# Patient Record
Sex: Female | Born: 1950
Health system: Southern US, Community
[De-identification: ages and names within clinical notes are randomized; demographics above are authoritative.]

## PROBLEM LIST (undated history)

## (undated) DIAGNOSIS — M1990 Unspecified osteoarthritis, unspecified site: Secondary | ICD-10-CM

## (undated) DIAGNOSIS — K2210 Ulcer of esophagus without bleeding: Secondary | ICD-10-CM

## (undated) DIAGNOSIS — R0600 Dyspnea, unspecified: Secondary | ICD-10-CM

## (undated) DIAGNOSIS — M3119 Other thrombotic microangiopathy: Secondary | ICD-10-CM

## (undated) DIAGNOSIS — K2970 Gastritis, unspecified, without bleeding: Secondary | ICD-10-CM

## (undated) DIAGNOSIS — I509 Heart failure, unspecified: Secondary | ICD-10-CM

## (undated) DIAGNOSIS — E079 Disorder of thyroid, unspecified: Secondary | ICD-10-CM

## (undated) DIAGNOSIS — E669 Obesity, unspecified: Secondary | ICD-10-CM

## (undated) DIAGNOSIS — I2510 Atherosclerotic heart disease of native coronary artery without angina pectoris: Secondary | ICD-10-CM

## (undated) DIAGNOSIS — K269 Duodenal ulcer, unspecified as acute or chronic, without hemorrhage or perforation: Secondary | ICD-10-CM

## (undated) DIAGNOSIS — R413 Other amnesia: Secondary | ICD-10-CM

## (undated) DIAGNOSIS — I255 Ischemic cardiomyopathy: Secondary | ICD-10-CM

## (undated) DIAGNOSIS — D649 Anemia, unspecified: Secondary | ICD-10-CM

## (undated) DIAGNOSIS — E039 Hypothyroidism, unspecified: Secondary | ICD-10-CM

## (undated) DIAGNOSIS — I10 Essential (primary) hypertension: Secondary | ICD-10-CM

## (undated) DIAGNOSIS — I5022 Chronic systolic (congestive) heart failure: Secondary | ICD-10-CM

## (undated) DIAGNOSIS — E119 Type 2 diabetes mellitus without complications: Secondary | ICD-10-CM

## (undated) DIAGNOSIS — M311 Thrombotic microangiopathy: Secondary | ICD-10-CM

## (undated) HISTORY — DX: Disorder of thyroid, unspecified: E07.9

## (undated) HISTORY — PX: ECTOPIC PREGNANCY SURGERY: SHX613

## (undated) HISTORY — PX: SPLENECTOMY, PARTIAL: SHX787

## (undated) HISTORY — DX: Duodenal ulcer, unspecified as acute or chronic, without hemorrhage or perforation: K26.9

## (undated) HISTORY — DX: Type 2 diabetes mellitus without complications: E11.9

## (undated) HISTORY — DX: Anemia, unspecified: D64.9

## (undated) HISTORY — DX: Heart failure, unspecified: I50.9

## (undated) HISTORY — DX: Other amnesia: R41.3

## (undated) HISTORY — PX: CARDIAC CATHETERIZATION: SHX172

## (undated) HISTORY — DX: Obesity, unspecified: E66.9

---

## 2005-06-20 DIAGNOSIS — M3119 Other thrombotic microangiopathy: Secondary | ICD-10-CM

## 2005-06-20 HISTORY — DX: Other thrombotic microangiopathy: M31.19

## 2005-06-21 ENCOUNTER — Inpatient Hospital Stay (HOSPITAL_COMMUNITY): Admission: EM | Admit: 2005-06-21 | Discharge: 2005-07-27 | Payer: Self-pay | Admitting: Emergency Medicine

## 2005-06-22 ENCOUNTER — Ambulatory Visit: Payer: Self-pay | Admitting: Internal Medicine

## 2005-06-22 ENCOUNTER — Encounter: Payer: Self-pay | Admitting: Pulmonary Disease

## 2005-06-28 ENCOUNTER — Ambulatory Visit: Payer: Self-pay | Admitting: Pulmonary Disease

## 2005-07-20 ENCOUNTER — Encounter (INDEPENDENT_AMBULATORY_CARE_PROVIDER_SITE_OTHER): Payer: Self-pay | Admitting: Specialist

## 2005-07-20 HISTORY — PX: SPLENECTOMY: SUR1306

## 2005-07-23 ENCOUNTER — Ambulatory Visit: Payer: Self-pay | Admitting: Oncology

## 2005-07-26 ENCOUNTER — Ambulatory Visit: Payer: Self-pay | Admitting: Internal Medicine

## 2005-08-02 ENCOUNTER — Ambulatory Visit: Payer: Self-pay | Admitting: Family Medicine

## 2005-08-09 ENCOUNTER — Ambulatory Visit: Payer: Self-pay | Admitting: Family Medicine

## 2005-11-01 ENCOUNTER — Ambulatory Visit: Payer: Self-pay | Admitting: Internal Medicine

## 2006-01-03 ENCOUNTER — Ambulatory Visit: Payer: Self-pay | Admitting: Internal Medicine

## 2006-05-10 ENCOUNTER — Inpatient Hospital Stay (HOSPITAL_COMMUNITY): Admission: EM | Admit: 2006-05-10 | Discharge: 2006-05-16 | Payer: Self-pay | Admitting: Emergency Medicine

## 2007-03-27 ENCOUNTER — Ambulatory Visit (HOSPITAL_COMMUNITY): Admission: RE | Admit: 2007-03-27 | Discharge: 2007-03-27 | Payer: Self-pay | Admitting: Pulmonary Disease

## 2007-04-21 ENCOUNTER — Ambulatory Visit (HOSPITAL_COMMUNITY): Admission: RE | Admit: 2007-04-21 | Discharge: 2007-04-21 | Payer: Self-pay | Admitting: General Surgery

## 2007-04-24 ENCOUNTER — Encounter (INDEPENDENT_AMBULATORY_CARE_PROVIDER_SITE_OTHER): Payer: Self-pay | Admitting: Diagnostic Radiology

## 2007-09-21 ENCOUNTER — Encounter: Payer: Self-pay | Admitting: Family Medicine

## 2008-03-31 ENCOUNTER — Ambulatory Visit (HOSPITAL_COMMUNITY): Admission: RE | Admit: 2008-03-31 | Discharge: 2008-03-31 | Payer: Self-pay | Admitting: Pulmonary Disease

## 2010-10-10 ENCOUNTER — Encounter: Payer: Self-pay | Admitting: Emergency Medicine

## 2010-10-20 NOTE — Letter (Signed)
Summary: Historic Patient File  Historic Patient File   Imported By: Lind Guest 06/22/2010 09:20:21  _____________________________________________________________________  External Attachment:    Type:   Image     Comment:   External Document

## 2011-02-02 NOTE — H&P (Signed)
NAME:  [name], SANTINO NO.:  [id number]   MEDICAL RECORD NO.:  [id number]          PATIENT TYPE:  OUT   LOCATION:  RAD                           FACILITY:  [facility]   PHYSICIAN:  Dalia Heading, M.D.  DATE OF BIRTH:  11/05/1950   DATE OF ADMISSION:  DATE OF DISCHARGE:  LH                              HISTORY & PHYSICAL   CHIEF COMPLAINT:  Solitary thyroid nodule.   HISTORY OF PRESENT ILLNESS:  The patient is a 60 year old black female  who is referred for evaluation and treatment of a right thyroid nodule.  She has had some voice changes, but no dysphagia or pressure sensation.  She does have weight gain.  There is no family history of thyroid cancer  or radiation to the neck.  No heart palpitations or heat intolerances  noted.   PAST MEDICAL HISTORY:  TTP, insulin dependent diabetes mellitus.   PAST SURGICAL HISTORY:  Splenectomy.   CURRENT MEDICATIONS:  1. Lantus.  2. NovoLog.  3. Avapro.   ALLERGIES:  No known drug allergies.   REVIEW OF SYSTEMS:  Noncontributory.   PHYSICAL EXAM:  GENERAL:  Patient is a well developed, well nourished  black female in no acute distress.  NECK:  Supple without lymphadenopathy.  A large thyroid is noted  diffusely with a large upper right pole nodule palpable and visible.  LUNGS:  Clear to auscultation with equal breath sounds bilaterally.  HEART:  Reveals regular rate and rhythm without S3, S4, murmurs.   LABORATORY DATA:  Ultrasound of the thyroid reveals a 2.6 x 2.3 x 1.5 cm  dominant mass in the right upper thyroid pole.  It is solid in nature.   IMPRESSION:  Solitary right thyroid lobe nodule.   RECOMMENDATIONS:  The patient is scheduled for an ultrasound guided fine  needle aspiration of the thyroid nodule on 04/21/07.      Dalia Heading, M.D.  Electronically Signed     MAJ/MEDQ  D:  04/20/2007  T:  04/21/2007  Job:  [id number]   cc:   [facility] Radiology Department   Ramon Dredge L. Juanetta Gosling, M.D.  Fax: [phone]

## 2011-02-05 NOTE — Op Note (Signed)
NAMEKENZLIE, DISCH NO.:  [id number]   MEDICAL RECORD NO.:  [id number]          PATIENT TYPE:  INP   LOCATION:  5532                         FACILITY:  [facility]   PHYSICIAN:  Cherylynn Ridges, M.D.    DATE OF BIRTH:  Feb 06, 1951   DATE OF PROCEDURE:  07/20/2005  DATE OF DISCHARGE:                                 OPERATIVE REPORT   PREOP DIAGNOSIS:  Thrombocytopenic purpura with splenomegaly.   POSTOP DIAGNOSIS:  Thrombocytopenic purpura with splenomegaly with a normal  size spleen.   PROCEDURE:  Splenectomy.   SURGEON:  Dr. Lindie Spruce.   ASSISTANT:  Dr. Janee Morn and physician assistant student Darcel Bayley.   ANESTHESIA:  General endotracheal.   ESTIMATED BLOOD LOSS:  500 mL.   COMPLICATIONS:  None.   CONDITION:  Stable.   FINDINGS:  Normal-sized spleen with adhesions in the left upper quadrant.   INDICATIONS FOR OPERATION:  The patient is a 60 year old female who has been  in the hospital getting plasmapheresis for recently diagnosed of TTP who now  comes in for splenectomy.   OPERATION:  The patient was taken to the operating room, placed on table in  supine position. After an adequate endotracheal anesthetic was administered  she was prepped and draped in usual sterile manner exposing primarily her  left upper quadrant in the midline.   The left subcostal incision was made #10 blade measuring approximately 20 to  25 cm long.  It was taken down into the subcutaneous tissue down through the  anterior sheath of the rectus muscle. Rectus muscle itself and then the  posterior sheath up to the midline medially and laterally to the strap  muscles of the lateral side. We took it down to the peritoneal cavity and  once we were in place, used an Omni retractor to provide adequate exposure  the left upper quadrant.   The patient was placed in reverse Trendelenburg and her right side was  tilted down order to get exposure.  Adhesions of omentum and other  structures to the anterior abdominal wall had to be taken down including the  left lateral lobe of the liver. Once this was done, allowed [place] to expose the  spleen. There was a splenocolic ligament which was cauterized and moved out  of the way as we mobilized the spleen medially.   We cauterized, we used Metzenbaum scissors to cut the peritoneum and the  superior lateral aspect of the spleen in order to mobilize it medially. This  allowed [place] some exposure and allowed [place] to get to the hilum. Initial Kelly  clamp across the hilum caused some bleeding which had to be controlled with  the large clamp across the hilum. We then were able to control the bleeding  using Kelly clamps and 2-0 silk ties. The suture ligatures of 2-0 silk also  had to be used to control bleeding in that area. Short gastric vessels were  taken between right-angle clamps and 2-0 silk ties. Once this was done, we  irrigated with saline then obtained further hemostasis using suture  ligatures, ties, large hemoclips and  subsequently at the end we applied  Tisseel, Avoteen and Thrombin Gelfoam in the left upper quadrant. This was  after the spleen had been removed and placed off as specimen.   Once we had adequate hemostasis, we irrigated and placed the hemostatic  agents as mentioned. Once this was done, we then went ahead and closed  without a drain. The posterior rectus sheath was closed using running #1 PDS  suture and the anterior sheath and midline were closed using interrupted  figure-of-eight stitches of #1 Vicryl and #1 PDS. We irrigated subcu and  closed skin using stainless steel staples. All counts were correct including  needles, sponges and instruments.  Sterile dressing was applied.      Cherylynn Ridges, M.D.  Electronically Signed     JOW/MEDQ  D:  07/20/2005  T:  07/20/2005  Job:  [id number]   cc:   Coralyn Helling, M.D.

## 2011-02-05 NOTE — Discharge Summary (Signed)
[name], [name]:  [id number]   MEDICAL RECORD NO.:  [id number]          PATIENT TYPE:  INP   LOCATION:  5532                         FACILITY:  [facility]   PHYSICIAN:  Coralyn Helling, M.D.      DATE OF BIRTH:  Jun 01, 1951   DATE OF ADMISSION:  06/22/2005  DATE OF DISCHARGE:  07/27/2005                           DISCHARGE SUMMARY - REFERRING   DISCHARGE DIAGNOSES:  1.  Severe thrombocytopenia purpura requiring plasmapheresis.  2.  Anemia.  3.  Steroid induced hyperglycemia.   PROCEDURES:  1.  Right femoral dialysis catheter placed on June 22, 2005, with      subsequent removal.  2.  Foley catheter placed on June 22, 2005, with subsequent removal.  3.  Continued plasmapheresis first done on June 22, 2005, which is not      being discontinued.   HISTORY OF PRESENT ILLNESS:  [name] is a 60 year old African American  female who presented to [facility] on June 22, 2005, and noted  to have two weeks of nausea and vomiting with bilious colored fluid.  She  denied hematemesis and had a vague abdominal pain that had been intermittent  with cramping.  She also had two to three episodes of loose bowel movements.  She had been having a fever of 102.  She was having headache with difficulty  with her vision.  She denied chest pain, dyspnea, difficulty swallowing or  dysarthria or dysuria.  She also has been having epistasis but denied other  bleeding.  She was admitted for further evaluation and treatment after being  transferred from [facility].  Hematology/oncology consult was  obtained with Dr. Lajuana Matte.   LABORATORY DATA:  Hemoglobin 10.9, hematocrit 32.7, platelets 145, wbc 5.4.  Sodium 128, potassium 4.5, chloride 95, CO2 25, BUN 17, creatinine 0.7,  glucose last checked on July 22, 2005, was 317.  Calcium 8.0.  Blood  cultures were negative x2.  Urine culture was negative.  Stool culture was  negative.  Antibiotics  were Avelox started on June 22, 2005, with  subsequent discontinuation.   RADIOGRAPHIC DATA:  Chest x-ray shows increased edema.   HOSPITAL COURSE BY DISCHARGE DIAGNOSIS:  DISCHARGE DIAGNOSIS #1 -  THROMBOCYTOPENIA PURPURA:  She was admitted to [facility] and underwent plasmapheresis via left femoral dialysis catheter  after having hematology/oncology consult with Dr. Lajuana Matte.  She  reached maximal hospital benefit on July 27, 2005, and was ready for  discharge home.  She will be followed up by Dr. Lajuana Matte on an  outpatient basis.  She had a splenectomy performed on July 20, 2005, by  Dr. Megan Mans which demonstrated a normal size spleen.  Staples had been  removed and no further interventions were required. She will follow up with  Dr. Megan Mans in approximately one to two weeks following discharge.   DISCHARGE DIAGNOSIS #2 - STEROID INDUCED HYPERGLYCEMIA:  She was on high  dose Decadron while on plasmapheresis.  Her glucose was markedly elevated.  She has been on sliding scale insulin,  Lantus, on b.i.d. basis up to 85  units a day.  Currently she is down to 65 units a day of Lantus and on a  sliding scale insulin which she will be discharged with.  She had  endocrinology consult with guidelines given by Dr. Ardyth Harps of the  [facility].  She will also follow up with Dr. Tarri Abernethy,  internal medicine, after discharge from hospital for continuation of her  decreased need for insulin.  She is also started on Avandia to further  facilitate lowering her glucose.  Of note, by the day of discharge, her CBGs  had reached a low of 76 and her insulin was further decreased prior to  discharge.  She has been instructed, should her glucose be less than 60, she  is to hold all insulin until further evaluated by physician.   DISCHARGE DIAGNOSIS #3 - HYPERTENSION:  Hypertension is addressed with  hydrochlorothiazide and  Lopressor.   DISCHARGE MEDICATIONS:  1.  Hydrochlorothiazide 25 mg once a day.  2.  Lopressor 50 mg two times a day.  3.  Lantus 65 units subcu two times a day.  4.  Sliding scale insulin, Humulin R insulin.  She is to check her glucose      in the a.m., 12 noon and 6 p.m.  At those times, glucose coverage 60 to      100 is 0, 101 to 150 is 3 units, 151 to 200 is 4 units, 201 to 250 is 8      units, 251 to 300 is 12 units, 301 to 350 is 16 units, 351 to 400 is 20      units of subcu.  She is to check her glucose at bedtime, if greater than      250 - 4 units subcu, or no coverage at all.  5.  Avandia 2 mg with breakfast and at supper.   SPECIAL INSTRUCTIONS:  If insulin is less than or equal to 50, she is to  hold Lantus and call Dr. Early Chars at [facility], for guidance on  insulin.   DIET:  Heart-health diabetic diet.   ACTIVITY:  Increase her activity slowly.   FOLLOW UP:  Dr. Tarri Abernethy on August 02, 2005, at 11:30 a.m. and call [phone] for any problems.  She is to call Dr. Megan Mans, [phone], for follow-up  appointment in one to two weeks.  She has a follow-up appointment with Dr.  Arbutus Ped on August 03, 2005, at 3 p.m. at the [facility]   DISPOSITION/CONDITION ON DISCHARGE:  Improved.      Brett Canales Minor, A.C.N.P. [facility]      Coralyn Helling, M.D.  Electronically Signed    SM/MEDQ  D:  07/27/2005  T:  07/27/2005  Job:  [id number]   cc:   Cherylynn Ridges, M.D.  [address]  [place]  [place] [address]   Tera Mater. Evlyn Kanner, M.D.  Fax: [phone]   Lajuana Matte, MD  Fax: [phone]

## 2011-02-05 NOTE — H&P (Signed)
NAMELAKIYA, COTTAM NO.:  [id number]   MEDICAL RECORD NO.:  [id number]          PATIENT TYPE:  INP   LOCATION:  IC04                          FACILITY:  [facility]   PHYSICIAN:  Hanley Hays. Dechurch, M.D.DATE OF BIRTH:  1951/02/20   DATE OF ADMISSION:  05/10/2006  DATE OF DISCHARGE:  LH                                HISTORY & PHYSICAL   HISTORY OF PRESENT ILLNESS:  This is a 60 year old African-American female  with no primary physician with a past medical history remarkable for  diabetes mellitus, steroid induced and TTP status post splenectomy, who  was brought to the emergency room via EMS after her family found that she  was unable to get out of bed unassisted.  The patient apparently had gone to  [place] with her family over this past weekend and was doing okay. They  noted that she had increased problems with dragging her right foot and  shaking of her right upper extremity.  After the family noted she was unable  to get out of bed, they also noted that she was lethargic.  The patient had  been feeling poorly over the last 2 days according to the family, and had  some nausea and vomiting. On further history it sounds like the patient  actually has had increasing dyspnea on exertion with poor exercise tolerance  over the last several weeks to months. She has had polyuria and polydipsia.  They also are quite concerned about a weak and shaking right upper  extremity. They had made a family trip over the weekend and patient was  unable cut her own meat, which is unusual. They have also noticed she is  dragging her right leg which has also been ongoing for several weeks or  possibly longer. Upon presentation in the emergency room she is near  obtunded. Her pH was 6.9 with a PCO2 of 9, and O2 of 242 on a non re-  breather mask.  CO2 on her metabolic panel is 4, white count is elevated at  27,000. BUN is 27 with a creatinine of 2, glucose was 716, sodium 131. The  patient is unable to provide any information, all information is obtained  from the chart and from the family.   MEDICATIONS:  She is not on any medications, over-the-counter or prescribed.   She was followed by Dr. Loleta Chance until he left early this spring. However she  has not taken insulin for quite some time.   ALLERGIES:  No known drug allergies.   PAST MEDICAL HISTORY:  1. History of TPP diagnosed October, 2006 when she presented with anemia      and thrombocytopenia and underwent plasma phoresis and Decadron      therapy. During the hospital stay she was quoted to be hyperglycemic      secondary to steroids but more likely she had underlying diabetes.  2. History of hypertension though not on any medications.  3. Tobacco abuse previously, smoking about a pack per day but very little      over the last several months.  4. History of diabetes  mellitus in multiple siblings and family members.   SOCIAL HISTORY:  She lives with her daughter and two grandchildren. No  alcohol abuse. Occasional tobacco. She has three children, two local, one in  [place] and several siblings who are very supportive.   PHYSICAL EXAMINATION:  VITAL SIGNS:  Reveals a near obtunded female,  tachypneic with a respiratory rate about 26, O2 saturations 100% on 3  liters. Blood pressure is 140/76, pulse is 120 and sinus. She is in no  distress.  NECK:  There is no JVD present.  HEENT:  Oropharynx is dry with acetone breath. Pupils are reactive.  LUNGS:  Clear to auscultation.  HEART:  Regular.  ABDOMEN:  Obese, soft but nontender with positive bowel sounds.  EXTREMITIES:  Without clubbing or cyanosis. There is no edema present. She  cannot cooperative with neurologic exam. She moves her extremities x4 to  withdrawal.   ASSESSMENT AND PLAN:  1. Diabetic ketoacidosis.  2. Leukocytosis, probably reactive. There has been no fever or anything to      suggest infection. There is no evidence of infection therefore  I am      going to hold on empiric antibiotics. This is probably more pronounced      secondary to her splenectomy.  3. History of TTP. Platelet count is stable. Fortunately this does not      appear to be playing a role.  4. Right-sided weakness with probable cerebrovascular accident. Once she      is stabilized from the hemodynamic/metabolic stand-point will pursue CT      scan and/or further evaluation.  She need to be on a comprehensive      preventive regimen. Financial issues will likely play a role. We will      have discharge planning assist so that this patient may be managed      appropriately.   This has been discussed with her multiple family members who are present,  who are very supportive and understanding. Continue with supportive  therapies at this point.      Hanley Hays Josefine Class, M.D.  Electronically Signed     FED/MEDQ  D:  05/10/2006  T:  05/10/2006  Job:  [id number]

## 2011-02-05 NOTE — Op Note (Signed)
[name], KAIN NO.:  [id number]   MEDICAL RECORD NO.:  [id number]          PATIENT TYPE:  INP   LOCATION:  5524                         FACILITY:  [facility]   PHYSICIAN:  Janetta Hora. Fields, MD  DATE OF BIRTH:  1951/05/18   DATE OF PROCEDURE:  06/26/2005  DATE OF DISCHARGE:                                 OPERATIVE REPORT   PROCEDURE:  1.  Ultrasound of the neck.  2.  Right internal jugular vein Diatek catheter.   PREOPERATIVE DIAGNOSES:  TTP.   POSTOPERATIVE DIAGNOSIS:  TTP.   ANESTHESIA:  Local with IV sedation.   ASSISTANT:  Nurse.   OPERATIVE FINDINGS:  A 23 cm Diatek catheter, right internal jugular vein.   OPERATIVE DETAILS:  After obtaining informed consent, the patient was taken  to the operating room.  The patient was placed in the supine position on the  operating room table.  __________ ultrasound was used to inspect the right  neck.  The right internal jugular vein was widely patent, with normal  compressibility and respiratory variation.  Next, the patient's entire neck  and chest were prepped and draped in the usual sterile fashion.  Local  anesthesia was infiltrated over the right internal jugular vein.  A small  finder needle was used to localize the right internal jugular vein.  A  larger introducer needle was then used to cannulate the right internal  jugular vein.  A .035 J-tipped guide wire was then threaded through the  right internal jugular vein, down into the right atrium and into the  inferior vena cava under fluoroscopic guidance.  Next, sequential 12, 14 and  16-French dilators were placed over the guide wire into the right atrium.  The dilator and guide wire was then removed, and a 23 cm Diatek catheter  threaded down into the right atrium, and then the peel-away sheath removed.  The catheter was then tunneled subcutaneously to the right infraclavicular  space, cut to length and the hub attached.  The catheter was noted to  flush  and draw easily.  The catheter was sutured to the skin with nylon sutures.  Neck insertion site was closed with a Vicryl stitch.  The catheter was then  loaded with concentrated heparin solution.  The catheter was inspected under  fluoroscopy, found that the tip was to be in the right atrium and no kinks  throughout its course.  The patient tolerated the procedure well and there  were no complications.  Sponge, needle and instrument count was correct at  the end of the case.  The patient taken to the recovery room in stable  condition.           ______________________________  Janetta Hora Fields, MD     CEF/MEDQ  D:  06/26/2005  T:  06/26/2005  Job:  [id number]

## 2011-02-05 NOTE — Discharge Summary (Signed)
[name], [name]               ACCOUNT NO.:  [id number]   MEDICAL RECORD NO.:  [id number]          PATIENT TYPE:  INP   LOCATION:  A225                          FACILITY:  [facility]   PHYSICIAN:  Margaretmary Dys, M.D.DATE OF BIRTH:  [birth year]   DATE OF ADMISSION:  05/10/2006  DATE OF DISCHARGE:  08/27/2007LH                                 DISCHARGE SUMMARY   DISCHARGE DIAGNOSES:  1. Altered mental status.  2. Diabetic ketoacidosis with poor compliance.  3. History of hypertension.  4. Status post splenectomy and Pneumovax shot.   DISCHARGE MEDICATIONS:  1. Lantus insulin 27 units subcutaneously b.i.d.  2. Lisinopril 10 mg p.o. once a day.  3. Nystatin cream applied to side daily.  4. NovoLog insulin with meals 3 units with meals as per sliding scale.  5. Glucerna.   DIET:  Diabetic diet, 1800 ADA.   ACTIVITY:  As tolerated.   SPECIAL INSTRUCTIONS:  The patient was advised to return to the emergency  room if she becomes more confused or if her blood sugars start rising in the  400 to 500 range again.  The patient subsequently reports evidence of  dizziness.   HOSPITAL COURSE:  [name] is a 60 year old African-American female with  no primary care physician who was brought in diabetic ketoacidosis.  Kindly  review the history of physical of May 10, 2006.  The patient was brought  in to the emergency room after the family found that she was unable to get  out of bed unassisted.  The patient had apparently gone to [place] with her  family over the weekend and was doing okay.  She has been having some  progressive problems with dragging her foot and shaking of her right upper  extremity.  She was also unable to get out of bed.  The patient was  increasingly weak over the past two days and had some nausea and vomiting.  She also had polyuria and polydipsia.   She was brought in to the emergency room where she was obtunded.  Her pH was  6.9 with a pCO2 of 9, oxygen  saturation was 242 on a nonrebreather.  Carbon  dioxide and metabolic profile was 4.  White blood cell count is admitted at  27,000.  BUN was 27, creatinine of 2, glucose was 716.  Sodium was 131.  The  patient was subsequently admitted and started on insulin infusion.  During  the course of the hospitalization, her acidosis corrected within 24 hours  and insulin infusion was discontinued and she was placed on her home dose of  Lantus which she seemed to tolerate fairly well.  The patient was also  aggressively hydrated.   Anemia was felt to be due to chronic disease.  Her hyperkalemia was treated  and the splenectomy needs to be followed with a different physician giving  Pneumovax as per patient.   DISPOSITION:  The patient is now being discharged.  She reports started  feeling very well, having no concerns.  I discussed with the nursing staff.  If there are any concerns about the patient going  home and there were no  concerns.  Anion gap corrected and the patient's oral intake had  significantly improved.  Her platelet count, which took a slight dip, had  also improved.      Margaretmary Dys, M.D.  Electronically Signed     AM/MEDQ  D:  05/25/2006  T:  05/26/2006  Job:  [id number]

## 2011-02-05 NOTE — H&P (Signed)
NAMEFLOYD, [name]               ACCOUNT NO.:  [id number]   MEDICAL RECORD NO.:  [id number]          PATIENT TYPE:  INP   LOCATION:  A201                          FACILITY:  [facility]   PHYSICIAN:  Margaretmary Dys, M.D.DATE OF BIRTH:  12-16-1950   DATE OF ADMISSION:  06/21/2005  DATE OF DISCHARGE:  LH                                HISTORY & PHYSICAL   PRIMARY CARE PHYSICIAN:  Scott A. Gerda Diss, M.D.   ADMISSION DIAGNOSES:  1.  Fever.  2.  Nausea and vomiting with abdominal pain.  3.  Thrombocytopenia.  4.  Altered mental status.  5.  Severe anemia.  6.  Probable thrombotic thrombocytopenic purpura.   CHIEF COMPLAINT:  Altered mental status, nausea, vomiting, and fatigue of  one week duration.   HISTORY OF PRESENT ILLNESS:  [name] is a 60 year old African-American  female with no significant past medical history. She presented to the  emergency room with complaints of nausea, vomiting, and fatigue. This has  been going on for about a week. The family members were very concerned as  she got more confused and delusional today. She was subsequently brought  into the emergency room. The patient does report some fever. She denies any  hemoptysis, hematemesis, melanotic stools. She did have an episode of a nose  bleed.   Evaluation in the emergency room revealed that she was severely anemic.  While the patient was being admitted to the floor, a platelet count was  pending . While I was examining her on the floor, I was told that her  platelet count was 8. I also looked at her blood smear and realized that she  had schizocytes.   Based on this fever of 100.9 degrees, severe anemia, low platelet count, and  altered mental status and schistocytes on peripheral smear , it is likely  the patient has thrombotic thrombocytopenic purpura , she will will need to  be transferred urgently to a specialty center for further evaluation and  management. I discussed this with Dr. Craige Cotta who is  the ICU physician on call  at [facility]. He accepted the patient. She will be transferred with  an ambulance. She might need plasmapheresis .   PAST MEDICAL HISTORY:  None.   MEDICATIONS:  The patient states she took some over-the-counter Sudafed .Marland Kitchen   ALLERGIES:  No known drug allergies.   SOCIAL HISTORY:  The patient is single. Lives at home. She is 60 years old.  She has several children. She smokes one pack a day. She has a 40-pack-year  history of smoking. There is occasional alcohol use. Denies any illicit drug  use.   FAMILY HISTORY:  Noncontributory.   PHYSICAL EXAMINATION:  GENERAL:  The patient was conscious; however, was  restless, agitated, and appeared intermittently confused.  VITAL SIGNS:  Blood pressure was 142/68, pulse 110, respiratory rate 18,  temperature 100.9.  HEENT:  Normocephalic and atraumatic. Oral mucosa was moist. No exudates.  NECK:  Supple. No JVD and no lymphadenopathy.  LUNGS:  Clear clinically with good air entry bilaterally.  HEART:  S1 and S2 regular.  No S3, S4, gallops or rubs.  ABDOMEN:  Soft, not distended. It was obese. No masses palpable. No  tenderness. Bowel sounds were positive.  EXTREMITIES:  No pitting pedal edema. No calf induration or tenderness.  SKIN:  No abrasons, ecchymosis, or petechiae was seen.  CNS:  The patient was restless, agitated, appeared confused intermittently  and wanting to get out of bed. There were no focal neurologic signs.   LABORATORY DATA:  White blood cell count is 8.4, hemoglobin is 6.6,  hematocrit 19.2, platelet count 8, neutrophils were 78%. Sodium was 130,  potassium 3.2, chloride of 99, CO2 24, glucose 176, BUN of 12, creatinine  0.8, total bilirubin is 2.8, alkaline phosphate is 76, AST 44, ALT 25, total  protein is 7.3, albumin 3.2, calcium is 8.7. Amylase 170, lipase is 94.  Urinalysis shows protein with a positive nitrite.   Abdominal x-ray shows nonspecific bowel gas pattern.    ASSESSMENT/PLAN:  A 60 year old African-American female who presents with  fever, altered mental status, severe anemia, and thrombocytopenia all  concerning for thrombotic thrombocytopenic purpura. The patient will need to  be in tertiary institution where she can be evaluated  by a hematologist.  The patient did have evidence of schizocytes on her peripheral smear. I have  called Dr. Craige Cotta at [facility] in [place]. He accepted the  patient for transfer. The patient will be transferred stat. I will give her  some IV fluids here. We will hold off any transfusions at this time.      Margaretmary Dys, M.D.  Electronically Signed     AM/MEDQ  D:  06/22/2005  T:  06/22/2005  Job:  [id number]   cc:   Lorin Picket A. Gerda Diss, MD  Fax: [phone]   Coralyn Helling, M.D.

## 2011-02-05 NOTE — Consult Note (Signed)
[name]:  [id number]   MEDICAL RECORD NO.:  [id number]          Best TYPE:  INP   LOCATION:  2101                         FACILITY:  [facility]   PHYSICIAN:  Lajuana Matte, MD  DATE OF BIRTH:  06/28/1951   DATE OF CONSULTATION:  06/22/2005  DATE OF DISCHARGE:                                   CONSULTATION   REFERRING PHYSICIAN:  Coralyn Helling, M.D.   REASON FOR CONSULTATION:  A 60 year old African-American female presenting  with severe anemia and thrombocytopenia.   HISTORY:  [name] is a very pleasant 60 year old African-American female,  with no significant past medical history, who was transferred from [facility] Christina night before admission for further evaluation.  Christina  Best presented there with 1-week history of nausea and vomiting as well  as progressive confusion and delusions.  She was found at Christina emergency  department at [facility] to have significant anemia and low  platelet count of 8, hemoglobin 6.6, and hematocrit 19.2.  [name] was  transferred to [facility] Intensive Care Unit, and lab was  significant for Christina anemia and thrombocytopenia.  [name] had DIC panel  which showed normal PT, PTT, only low platelet count.  She was also found to  have a fever of 100.9.  Otherwise, no significant abnormalities.  Christina  peripheral smear showed increased number of schizocytes, and Dr. Craige Cotta kindly  asked me to evaluate [name] and given recommendation about management  of Christina questionable thrombotic thrombocytopenic purpura.   When seen, [name] was mildly confused but was still able to follow  commands and denied having any significant complaints.   REVIEW OF SYSTEMS:  She had low-grade fever, no chills, no headache, no  diplopia.  [name] denied having any chest pain, shortness of breath,  cough, hemoptysis, syncope, or palpitations.  No nausea, vomiting, abdominal  pain, diarrhea,  constipation, melena, or hematochezia.  No dysuria,  hematuria, urgency, or increased frequency.   PAST MEDICAL HISTORY:  As mentioned above.   FAMILY HISTORY:  Noncontributory.   SOCIAL HISTORY:  She is single, has several children.  She has a history of  smoking, drinks alcohol occasionally, no history of drug abuse.   ALLERGIES:  No known drug allergies.   HOME MEDICATIONS:  Includes only Sudafed as needed.   PHYSICAL EXAMINATION:  VITAL SIGNS:  Blood pressure 159/83, pulse 106,  respiratory rate 18, temperature 100.  GENERAL:  A very pleasant 60 year old African-American female, mildly  confused but, again, follows commands.  HEENT:  Normocephalic and atraumatic.  Clear oropharynx.  NECK:  Supple.  No lymphadenopathy.  CHEST:  Clear to auscultation bilaterally.  No wheezes, crackles, dullness  to percussion.  CARDIOVASCULAR: Normal S1, S2.  Regular rate and rhythm.  No murmurs,  gallops, or rubs.  ABDOMEN:  Soft, nontender, nondistended.  No masses.  EXTREMITIES:  No edema.  NEUROLOGIC:  No focal sensory or motor deficits, but again, [name] is  still slightly confusion.   LABORATORY DATA:  White blood count 8.1, hemoglobin 6.6, hematocrit 19.2,  platelets 16.  Sodium 135, potassium 3.4, BUN 9, creatinine 0.8, blood  glucose 198.  PT 15.6, INR 1.2, PTT 29.  Calcium 8.7, total bilirubin 3.3,  alkaline phosphatase 72.  Fibrinogen 426.  D-dimer 19.95.   Peripheral smear examined today and showed schizocytosis with decreased  platelets and increased reticulocytes.   ASSESSMENT:  This is a 60 year old African-American female who presented  with symptoms and signs consistent with thrombotic thrombocytopenic purpura  (TTP) with associated anemia and thrombocytopenia.   PLAN:  1.  Start [name] on plasmapheresis as soon as possible.  We will start      with 1 volume plasma exchange x2 today and then daily until her platelet      count improves to over 100,000 and also  until there is improvement in      her mental status.  2.  Until Christina plasmapheresis is started, we will start [name] on 2      units of fresh frozen plasma.  I would not advise any platelet      transfusion unless there is a lift-threatening bleeding problem.  3.  Will check haptoglobin, LDH, and uric acid.  4.  I will continue to follow [name] closely with you.  5.  I may consider adding Decadron 20 mg twice a day.   Thank you so much for allowing me to participate in Christina care of [name].      Lajuana Matte, MD  Electronically Signed     MKM/MEDQ  D:  06/23/2005  T:  06/23/2005  Job:  [id number]   cc:   Coralyn Helling, M.D.

## 2011-02-05 NOTE — Group Therapy Note (Signed)
[name], [name]               ACCOUNT NO.:  [id number]   MEDICAL RECORD NO.:  [id number]          PATIENT TYPE:  INP   LOCATION:  A225                          FACILITY:  [facility]   PHYSICIAN:  Margaretmary Dys, M.D.DATE OF BIRTH:  [birth year]   DATE OF PROCEDURE:  05/13/2006  DATE OF DISCHARGE:                                   PROGRESS NOTE   SUBJECTIVE:  The patient continues to do fairly well.  She is more alert  now.  She has had no more nausea or vomiting.   The patient would like to try some clear liquids.   OBJECTIVE:  GENERAL:  The patient is alert, comfortable, not in acute  distress.  VITAL SIGNS:  Blood pressure was 107/65, pulse of 98, respiration of 20.  Tmax is 97.8.  Blood sugars have ranged between 176 to 230.  Oxygen  saturation was 99% on 2.5 liters.  HEENT:  Normocephalic, atraumatic.  Oral mucosa was moist.  No exudates were  noted.  NECK:  Supple, no JVD.  LUNGS:  Clear clinically.  HEART:  S1, S2, regular.  ABDOMEN:  Soft.  EXTREMITIES:  Bilateral pitting pedal edema with no calf induration or  tenderness noted.   LABORATORY/DIAGNOSTIC DATA:  White blood cell count was 8.3, hemoglobin of  12.7, hematocrit was 39, platelet count was 154 with no left shift, sodium  136, potassium was 4.3, a chloride of 100, CO2 29, glucose of 249, BUN of 3,  creatinine of 0.6, calcium was 8.9.   ASSESSMENT AND PLAN:  1. Altered mental status, likely related to diabetic ketoacidosis.  The      patient's anion gap acidosis is corrected, and blood sugars have      improved.  She is currently off insulin infusion.  She is more alert      this morning.  She has also had less nausea and vomiting.  We will      continue intravenous fluids at this time, and also ask for continuing      of the insulin.  2. Nausea and vomiting with hypokalemia, likely multifactorial from      intravenous fluid infusion and also insulin infusion, and the patient's      poor oral intake and has  just been vomiting.  We will replace through      intravenous at this time.  I think we try to advance her diet and see      how she does.  She may have to be transferred out of the Intensive Care      Unit at this time.  Overall the patient shows significant improvement      at this time.      Margaretmary Dys, M.D.  Electronically Signed    AM/MEDQ  D:  05/13/2006  T:  05/13/2006  Job:  [id number]

## 2011-10-01 ENCOUNTER — Ambulatory Visit (HOSPITAL_COMMUNITY)
Admission: RE | Admit: 2011-10-01 | Discharge: 2011-10-01 | Disposition: A | Discharge status: Home / Self Care | Payer: Medicaid Other | Source: Ambulatory Visit | Attending: Pulmonary Disease | Admitting: Pulmonary Disease

## 2011-10-01 ENCOUNTER — Other Ambulatory Visit (HOSPITAL_COMMUNITY): Payer: Self-pay | Admitting: Pulmonary Disease

## 2011-10-01 DIAGNOSIS — M51379 Other intervertebral disc degeneration, lumbosacral region without mention of lumbar back pain or lower extremity pain: Secondary | ICD-10-CM | POA: Insufficient documentation

## 2011-10-01 DIAGNOSIS — M545 Low back pain, unspecified: Secondary | ICD-10-CM | POA: Insufficient documentation

## 2011-10-01 DIAGNOSIS — M25572 Pain in left ankle and joints of left foot: Secondary | ICD-10-CM

## 2011-10-01 DIAGNOSIS — J438 Other emphysema: Secondary | ICD-10-CM | POA: Insufficient documentation

## 2011-10-01 DIAGNOSIS — M5137 Other intervertebral disc degeneration, lumbosacral region: Secondary | ICD-10-CM | POA: Insufficient documentation

## 2011-10-01 DIAGNOSIS — M1610 Unilateral primary osteoarthritis, unspecified hip: Secondary | ICD-10-CM | POA: Insufficient documentation

## 2011-10-01 DIAGNOSIS — M25559 Pain in unspecified hip: Secondary | ICD-10-CM | POA: Insufficient documentation

## 2011-10-01 DIAGNOSIS — R079 Chest pain, unspecified: Secondary | ICD-10-CM | POA: Insufficient documentation

## 2011-10-01 DIAGNOSIS — M169 Osteoarthritis of hip, unspecified: Secondary | ICD-10-CM | POA: Insufficient documentation

## 2011-10-01 DIAGNOSIS — R05 Cough: Secondary | ICD-10-CM | POA: Insufficient documentation

## 2011-10-01 DIAGNOSIS — R059 Cough, unspecified: Secondary | ICD-10-CM | POA: Insufficient documentation

## 2011-10-01 DIAGNOSIS — M25579 Pain in unspecified ankle and joints of unspecified foot: Secondary | ICD-10-CM | POA: Insufficient documentation

## 2012-04-18 ENCOUNTER — Observation Stay (HOSPITAL_COMMUNITY)
Admission: EM | Admit: 2012-04-18 | Discharge: 2012-04-21 | Disposition: A | Discharge status: Home / Self Care | Payer: Medicaid Other | Attending: Internal Medicine | Admitting: Internal Medicine

## 2012-04-18 ENCOUNTER — Encounter (HOSPITAL_COMMUNITY): Payer: Self-pay

## 2012-04-18 ENCOUNTER — Emergency Department (HOSPITAL_COMMUNITY): Payer: Medicaid Other

## 2012-04-18 DIAGNOSIS — R1032 Left lower quadrant pain: Principal | ICD-10-CM | POA: Insufficient documentation

## 2012-04-18 DIAGNOSIS — Z23 Encounter for immunization: Secondary | ICD-10-CM | POA: Insufficient documentation

## 2012-04-18 DIAGNOSIS — Z79899 Other long term (current) drug therapy: Secondary | ICD-10-CM | POA: Insufficient documentation

## 2012-04-18 DIAGNOSIS — R1110 Vomiting, unspecified: Secondary | ICD-10-CM | POA: Insufficient documentation

## 2012-04-18 DIAGNOSIS — Z794 Long term (current) use of insulin: Secondary | ICD-10-CM | POA: Insufficient documentation

## 2012-04-18 DIAGNOSIS — R109 Unspecified abdominal pain: Secondary | ICD-10-CM

## 2012-04-18 DIAGNOSIS — E119 Type 2 diabetes mellitus without complications: Secondary | ICD-10-CM | POA: Insufficient documentation

## 2012-04-18 DIAGNOSIS — I10 Essential (primary) hypertension: Secondary | ICD-10-CM | POA: Insufficient documentation

## 2012-04-18 HISTORY — DX: Thrombotic microangiopathy: M31.1

## 2012-04-18 HISTORY — DX: Other thrombotic microangiopathy: M31.19

## 2012-04-18 HISTORY — DX: Essential (primary) hypertension: I10

## 2012-04-18 HISTORY — DX: Unspecified osteoarthritis, unspecified site: M19.90

## 2012-04-18 LAB — COMPREHENSIVE METABOLIC PANEL
ALT: 14 U/L (ref 0–35)
AST: 15 U/L (ref 0–37)
Albumin: 3.9 g/dL (ref 3.5–5.2)
CO2: 25 mEq/L (ref 19–32)
Chloride: 101 mEq/L (ref 96–112)
GFR calc non Af Amer: >90 mL/min (ref >90)
Sodium: 136 mEq/L (ref 135–145)
Total Bilirubin: 0.3 mg/dL (ref 0.3–1.2)

## 2012-04-18 LAB — CBC WITH DIFFERENTIAL/PLATELET
Eosinophils Relative: 1 % (ref 0–5)
Lymphocytes Relative: 12 % (ref 12–46)
Monocytes Relative: 4 % (ref 3–12)
Neutrophils Relative %: 83 % — ABNORMAL HIGH (ref 43–77)
Platelets: 247 10*3/uL (ref 150–400)
RBC: 4.95 MIL/uL (ref 3.87–5.11)
WBC: 10.7 10*3/uL — ABNORMAL HIGH (ref 4.0–10.5)

## 2012-04-18 LAB — URINALYSIS, ROUTINE W REFLEX MICROSCOPIC
Glucose, UA: 500 mg/dL — AB
Hgb urine dipstick: NEGATIVE
Ketones, ur: NEGATIVE mg/dL
Protein, ur: NEGATIVE mg/dL
pH: 7 (ref 5.0–8.0)

## 2012-04-18 LAB — GLUCOSE, CAPILLARY: Glucose-Capillary: 217 mg/dL — ABNORMAL HIGH (ref 70–99)

## 2012-04-18 MED ORDER — ONDANSETRON HCL 4 MG/2ML IJ SOLN
4.0000 mg | Freq: Once | INTRAMUSCULAR | Status: AC
  Administered 2012-04-18: 4 mg via INTRAVENOUS
  Filled 2012-04-18: qty 2

## 2012-04-18 MED ORDER — IOHEXOL 300 MG/ML  SOLN
100.0000 mL | Freq: Once | INTRAMUSCULAR | Status: AC | PRN
  Administered 2012-04-18: 100 mL via INTRAVENOUS

## 2012-04-18 MED ORDER — ENOXAPARIN SODIUM 40 MG/0.4ML ~~LOC~~ SOLN
40.0000 mg | SUBCUTANEOUS | Status: DC
  Administered 2012-04-18: 40 mg via SUBCUTANEOUS
  Filled 2012-04-18: qty 0.4

## 2012-04-18 MED ORDER — METOCLOPRAMIDE HCL 5 MG/ML IJ SOLN
10.0000 mg | Freq: Once | INTRAMUSCULAR | Status: AC
  Administered 2012-04-18: 10 mg via INTRAVENOUS
  Filled 2012-04-18: qty 20

## 2012-04-18 MED ORDER — INSULIN ASPART 100 UNIT/ML ~~LOC~~ SOLN
0.0000 [IU] | SUBCUTANEOUS | Status: DC
  Administered 2012-04-18 (×2): 3 [IU] via SUBCUTANEOUS
  Administered 2012-04-19: 1 [IU] via SUBCUTANEOUS
  Administered 2012-04-19: 3 [IU] via SUBCUTANEOUS
  Administered 2012-04-19: 1 [IU] via SUBCUTANEOUS
  Administered 2012-04-19: 2 [IU] via SUBCUTANEOUS

## 2012-04-18 MED ORDER — LABETALOL HCL 5 MG/ML IV SOLN
5.0000 mg | Freq: Four times a day (QID) | INTRAVENOUS | Status: DC | PRN
  Administered 2012-04-18: 5 mg via INTRAVENOUS
  Filled 2012-04-18 (×2): qty 4

## 2012-04-18 MED ORDER — SODIUM CHLORIDE 0.9 % IV SOLN: INTRAVENOUS | Status: DC

## 2012-04-18 MED ORDER — SODIUM CHLORIDE 0.9 % IV SOLN
INTRAVENOUS | Status: DC
  Administered 2012-04-19: 16:00:00 via INTRAVENOUS

## 2012-04-18 MED ORDER — ONDANSETRON HCL 4 MG/2ML IJ SOLN: 4.0000 mg | INTRAMUSCULAR | Status: DC | PRN

## 2012-04-18 MED ORDER — ONDANSETRON HCL 4 MG/2ML IJ SOLN
INTRAMUSCULAR | Status: AC
  Administered 2012-04-18: 4 mg via INTRAVENOUS
  Filled 2012-04-18: qty 2

## 2012-04-18 MED ORDER — SODIUM CHLORIDE 0.9 % IV BOLUS (SEPSIS)
500.0000 mL | Freq: Once | INTRAVENOUS | Status: AC
  Administered 2012-04-18: 500 mL via INTRAVENOUS

## 2012-04-18 MED ORDER — SODIUM CHLORIDE 0.9 % IJ SOLN
INTRAMUSCULAR | Status: AC
  Filled 2012-04-18: qty 3

## 2012-04-18 MED ORDER — PANTOPRAZOLE SODIUM 40 MG IV SOLR
40.0000 mg | INTRAVENOUS | Status: DC
  Administered 2012-04-18 – 2012-04-19 (×2): 40 mg via INTRAVENOUS
  Filled 2012-04-18 (×3): qty 40

## 2012-04-18 MED ORDER — PNEUMOCOCCAL VAC POLYVALENT 25 MCG/0.5ML IJ INJ
0.5000 mL | INJECTION | INTRAMUSCULAR | Status: AC
  Administered 2012-04-19: 0.5 mL via INTRAMUSCULAR
  Filled 2012-04-18: qty 0.5

## 2012-04-18 MED ORDER — HYDROMORPHONE HCL PF 1 MG/ML IJ SOLN
1.0000 mg | Freq: Four times a day (QID) | INTRAMUSCULAR | Status: DC | PRN
  Administered 2012-04-18 – 2012-04-20 (×3): 1 mg via INTRAVENOUS
  Filled 2012-04-18 (×3): qty 1

## 2012-04-18 MED ORDER — FENTANYL CITRATE 0.05 MG/ML IJ SOLN
50.0000 ug | Freq: Once | INTRAMUSCULAR | Status: AC
  Administered 2012-04-18: 50 ug via INTRAVENOUS
  Filled 2012-04-18: qty 2

## 2012-04-18 MED ORDER — ONDANSETRON HCL 4 MG/2ML IJ SOLN
4.0000 mg | Freq: Once | INTRAMUSCULAR | Status: AC
  Administered 2012-04-18: 4 mg via INTRAVENOUS

## 2012-04-18 NOTE — ED Provider Notes (Signed)
Care assumed at the change of shift.   Pt with abdominal pain and vomiting. Labs unremarkable. CT pending.   Pt had no acute process on CT, but pain and nausea continued. Could be gastroparesis. Admit to [facility], Reglan ordered.   Charles B. Bernette Mayers, MD 04/18/12 2348

## 2012-04-18 NOTE — ED Notes (Signed)
Patient to [facility] to collect urine sample. 

## 2012-04-18 NOTE — ED Notes (Signed)
Pt c/o left sided abd pain since 6 am. States she has been thrown up 7x since am. States emesis is clear and bitter. Pt states left arm numb since yesterday. Last normal BM 7/26.

## 2012-04-18 NOTE — ED Provider Notes (Signed)
History  This chart was scribed for Joya Gaskins, MD by Bennett Scrape. This patient was seen in room APA03/APA03 and the patient's care was started at 11:24AM.  CSN: [id number]  Arrival date & time 04/18/12  1113   First MD Initiated Contact with Patient 04/18/12 1124      Chief Complaint  Patient presents with  . Abdominal Pain     Patient is a 61 y.o. female presenting with vomiting. The history is provided by the patient. No language interpreter was used.  Emesis  This is a recurrent problem. The current episode started 6 to 12 hours ago. The problem occurs 5 to 10 times per day. The problem has not changed since onset.The emesis has an appearance of stomach contents. There has been no fever. Associated symptoms include abdominal pain. Pertinent negatives include no cough and no diarrhea.     [name] is a 61 y.o. female who presents to the Emergency Department complaining of 5 hours of non-changing, constant emesis described as clear liquid with associated intermittent mild LLQ abdominal pain and left lower back pain both described as sharp. She reports 7 episodes of emesis today. She denies having any modifying factors. She denies taking OTC medications at home to improve symptoms. Daughter reports that pt experiences similar symptoms frequently, the last episode occuring 2 weeks ago. Pt has not seen a MD for the symptoms prior to today. She denies fever, nausea, diarrhea, urinary symptoms and SOB as associated symptoms. She also has a h/o DM, HTN and arthritis. She reports having TTP 6 years ago but states that it has since resolved and she denies any recent episodes of TTP. She is a former smoker but denies alcohol use. She denies CP She denies rectal bleeding/melena  PCP is Dr. Juanetta Gosling.  Past Medical History  Diagnosis Date  . Diabetes mellitus   . Hypertension   . Arthritis   . TTP (thrombotic thrombocytopenic purpura) 6 years ago, resolved     Past Surgical  History  Procedure Date  . Splenectomy, partial     Family History  Problem Relation Age of Onset  . Heart failure Mother   . Cancer Mother     History  Substance Use Topics  . Smoking status: Former Smoker    Types: Cigarettes  . Smokeless tobacco: Not on file  . Alcohol Use: No      Review of Systems  Respiratory: Negative for cough and shortness of breath.   Cardiovascular: Negative for chest pain.  Gastrointestinal: Positive for vomiting and abdominal pain. Negative for nausea and diarrhea.  Genitourinary: Negative for dysuria and hematuria.  Musculoskeletal: Positive for back pain.  All other systems reviewed and are negative.    Allergies  Review of patient's allergies indicates no known allergies.  Home Medications  No current outpatient prescriptions on file.  Triage Vitals: BP 164/98  Pulse 95  Temp 97.8 F (36.6 C) (Oral)  Resp 16  SpO2 99%  Physical Exam  Nursing note and vitals reviewed.  CONSTITUTIONAL: Well developed/well nourished HEAD AND FACE: Normocephalic/atraumatic EYES: EOMI/PERRL ENMT: Mucous membranes moist NECK: supple no meningeal signs SPINE:entire spine nontender CV: S1/S2 noted, no murmurs/rubs/gallops noted LUNGS: Lungs are clear to auscultation bilaterally, no apparent distress ABDOMEN: soft, mild LLQ tenderness, no rebound or guarding GU:no cva tenderness NEURO: Pt is awake/alert, moves all extremitiesx4 EXTREMITIES: pulses normal, full ROM SKIN: warm, color normal PSYCH: no abnormalities of mood noted  ED Course  Procedures   DIAGNOSTIC STUDIES:  Oxygen Saturation is 99% on room air, normal by my interpretation.    COORDINATION OF CARE: 11:34AM-Discussed treatment plan which includes EKG, blood work, fluids and Zofran with pt at bedside and pt agreed to plan. Pt turned down pain medications.  2:09 PM Pt continues to have nausea and abdominal pain, now has some tenderness in LLQ, imaging ordered  D/w dr Bernette Mayers at  signout to f/u on CT imaging If tolerates PO and CT imaging negative may be amenable for discharge home   MDM  Nursing notes including past medical history and social history reviewed and considered in documentation Labs/vital reviewed and considered    Date: 04/18/2012  Rate: 90  Rhythm: normal sinus rhythm  QRS Axis: normal  Intervals: normal  ST/T Wave abnormalities: normal  Conduction Disutrbances:none      I personally performed the services described in this documentation, which was scribed in my presence. The recorded information has been reviewed and considered.      Joya Gaskins, MD 04/18/12 1538

## 2012-04-18 NOTE — ED Notes (Addendum)
Pt vomited after drinking oral contrast MD aware orders received for zofran

## 2012-04-18 NOTE — ED Notes (Signed)
Pt requesting medicine for pain and nausea. MD aware no orders received.

## 2012-04-19 DIAGNOSIS — R112 Nausea with vomiting, unspecified: Secondary | ICD-10-CM

## 2012-04-19 DIAGNOSIS — R109 Unspecified abdominal pain: Secondary | ICD-10-CM

## 2012-04-19 LAB — GLUCOSE, CAPILLARY
Glucose-Capillary: 129 mg/dL — ABNORMAL HIGH (ref 70–99)
Glucose-Capillary: 142 mg/dL — ABNORMAL HIGH (ref 70–99)
Glucose-Capillary: 145 mg/dL — ABNORMAL HIGH (ref 70–99)
Glucose-Capillary: 170 mg/dL — ABNORMAL HIGH (ref 70–99)

## 2012-04-19 LAB — URINE CULTURE: Colony Count: 50000

## 2012-04-19 MED ORDER — DOCUSATE SODIUM 100 MG PO CAPS
100.0000 mg | ORAL_CAPSULE | Freq: Every day | ORAL | Status: DC
  Administered 2012-04-19 – 2012-04-21 (×2): 100 mg via ORAL
  Filled 2012-04-19 (×2): qty 1

## 2012-04-19 MED ORDER — LISINOPRIL 10 MG PO TABS
10.0000 mg | ORAL_TABLET | Freq: Every day | ORAL | Status: DC
  Administered 2012-04-19 – 2012-04-21 (×3): 10 mg via ORAL
  Filled 2012-04-19 (×3): qty 1

## 2012-04-19 MED ORDER — INSULIN ASPART 100 UNIT/ML ~~LOC~~ SOLN
0.0000 [IU] | Freq: Four times a day (QID) | SUBCUTANEOUS | Status: DC
  Administered 2012-04-20 (×2): 1 [IU] via SUBCUTANEOUS
  Administered 2012-04-20 – 2012-04-21 (×2): 2 [IU] via SUBCUTANEOUS

## 2012-04-19 MED ORDER — INSULIN GLARGINE 100 UNIT/ML ~~LOC~~ SOLN
35.0000 [IU] | Freq: Two times a day (BID) | SUBCUTANEOUS | Status: DC
  Administered 2012-04-19 – 2012-04-21 (×4): 35 [IU] via SUBCUTANEOUS

## 2012-04-19 NOTE — Progress Notes (Signed)
Subjective: Patient feels better today. Her nausea vomiting is better. No new complaint.  Objective: Vital signs in last 24 hours: Temp:  [97.8 F (36.6 C)-98.8 F (37.1 C)] 98 F (36.7 C) (07/31 0558) Pulse Rate:  [83-96] 88  (07/31 0558) Resp:  [16-20] 18  (07/31 0558) BP: (89-185)/(60-107) 123/80 mmHg (07/31 0558) SpO2:  [92 %-100 %] 93 % (07/31 0558) Weight:  [97.07 kg (214 lb)-102.1 kg (225 lb 1.4 oz)] 102.1 kg (225 lb 1.4 oz) (07/31 0558) Weight change:  Last BM Date: 04/15/12  Intake/Output from previous day: 07/30 0701 - 07/31 0700 In: 1265 [I.V.:1265] Out: -   PHYSICAL EXAM General appearance: alert, cooperative and no distress Resp: clear to auscultation bilaterally Cardio: regular rate and rhythm, S1, S2 normal, no murmur, click, rub or gallop GI: soft, non-tender; bowel sounds normal; no masses,  no organomegaly Extremities: extremities normal, atraumatic, no cyanosis or edema  Lab Results:    @labtest @ ABGS No results found for this basename: PHART,PCO2,PO2ART,TCO2,HCO3 in the last 72 hours CULTURES No results found for this or any previous visit (from the past 240 hour(s)). Studies/Results: Ct Abdomen Pelvis W Contrast  04/18/2012  *RADIOLOGY REPORT*  Clinical Data: Pain and vomiting.  CT ABDOMEN AND PELVIS WITH CONTRAST  Technique:  Multidetector CT imaging of the abdomen and pelvis was performed following the standard protocol during bolus administration of intravenous contrast.  Contrast: OMNIPAQUE IOHEXOL 300 MG/ML  SOLN  Comparison: None.  Findings: The lung bases are clear.  There is no evidence for free intraperitoneal air.  There is a small hiatal hernia.  There is a subtle 1.3 cm low density area in the anterior right hepatic lobe which is less conspicuous on the delayed images.  This is a nonspecific finding but could represent a small hemangioma.  Otherwise, there is normal appearance of the liver and portal venous system.  There is a small low  density focus in the pancreatic head that measures 0.8 cm and suggests focal fat.  No evidence of pancreatic duct dilatation.  There is a round 2.6 cm structure along the tail of the pancreas.  The spleen has been removed and suspect that this round lesion represents an accessory spleen or splenule.  Normal appearance of the adrenal glands and kidneys.  The uterus has a nodular appearance and there is a large calcified structure in the lower uterine segment suggestive for a fibroid.  No evidence for free fluid.  There are small lymph nodes along the iliac nodal chains which are nonspecific.  Fluid within the urinary bladder.  There is marked sclerosis of the left femoral head with a subchondral fracture. No significant femoral head depression.  Both hips are located.  IMPRESSION: No acute findings within the abdomen and pelvis.  The spleen has been removed and there is 2.6 cm round structure along the tail the pancreas.  This most likely represents residual splenic tissue or a splenule.  However, the pancreas lesion cannot be completely excluded.  A heat damage red blood cell nuclear medicine scan could be used to confirm that this round structure is splenic tissue.  Left femoral head AVN with a subchondral fracture.  Uterine fibroids.  Small lymph nodes in the retroperitoneum are nonspecific.  Original Report Authenticated By: Richarda Overlie, M.D.    Medications: I have reviewed the patient's current medications.  Assesment: 1. Nausea and vomiting secondary to diabetic gastoparesiss 2. DM type II 3. Hypertension Active Problems:  * No active hospital problems. *  Plan: We will start on clear liquid diet GI consult    LOS: 1 day   Jaizon Deroos 04/19/2012, 7:23 AM

## 2012-04-19 NOTE — Consult Note (Signed)
Patient evaluated for acute onset of nausea and vomiting. Had similar episode 3 weeks ago. She is doing better with supportive therapy. She's been diabetic for 6 years with suboptimal control. She may very well have diabetic gastroparesis but we also need to rule out peptic ulcer disease. She was treated for duodenal ulcer when she was 61 years old. Recommendations; Diet advanced. EGD in a.m. Hold tonight's Lovenox dose.

## 2012-04-19 NOTE — H&P (Signed)
[name] MRN: [id number] DOB/AGE: Nov 29, 1950 61 y.o. Primary Care Physician:HAWKINS,EDWARD L, MD Admit date: 04/18/2012 Chief Complaint: Nausea and  vomitiong HPI: This is a 61 years old female patient with history of DM type II on insulin came to [facility] with the above complaint, She received several doses of antiemetics but continued to vomit. She couldn't keep anything in her stomach. She is admitted for further evaluation. No history of hematemesis or melena.  No fever, chills, cough, chest pain,  Abdominal pain, dysuria, urgency or frequency of urination.  Past Medical History  Diagnosis Date  . Diabetes mellitus   . Hypertension   . Arthritis   . TTP (thrombotic thrombocytopenic purpura)    Past Surgical History  Procedure Date  . Splenectomy, partial         Family History  Problem Relation Age of Onset  . Heart failure Mother   . Cancer Mother     Social History:  reports that she has quit smoking. Her smoking use included Cigarettes. She does not have any smokeless tobacco history on file. She reports that she does not drink alcohol or use illicit drugs.   Allergies: No Known Allergies  Medications Prior to Admission  Medication Sig Dispense Refill  . docusate sodium (STOOL SOFTENER) 100 MG capsule Take 100 mg by mouth as needed. For relief      . ibuprofen (ADVIL,MOTRIN) 200 MG tablet Take 400 mg by mouth daily as needed. For pain      . insulin glargine (LANTUS) 100 UNIT/ML injection Inject 35 Units into the skin 2 (two) times daily.      . insulin lispro (HUMALOG) 100 UNIT/ML injection Inject 5 Units into the skin 3 (three) times daily before meals.      Marland Kitchen lisinopril (PRINIVIL,ZESTRIL) 10 MG tablet Take 10 mg by mouth daily.           WUJ:WJXBJ from the symptoms mentioned above,there are no other symptoms referable to all systems reviewed.  Physical Exam: Blood pressure 123/80, pulse 88, temperature 98 F (36.7 C), temperature source Tympanic, resp. rate  18, height 5\' 6"  (1.676 m), weight 102.1 kg (225 lb 1.4 oz), SpO2 93.00%. Alert, awake and sick looking HEENT- pupils equal and reactive, Neck supple Chest- clear lung field good air entry CVS- S1 and S2 heard, no murmur or gallop Abdomen- soft and lax bowel sound + Extremities- Negative     Basename 04/18/12 1136  WBC 10.7*  NEUTROABS 8.9*  HGB 13.3  HCT 40.1  MCV 81.0  PLT 247    Basename 04/18/12 1136  NA 136  K 3.9  CL 101  CO2 25  GLUCOSE 276*  BUN 10  CREATININE 0.57  CALCIUM 9.4  MG --  lablast2(ast:2,ALT:2,alkphos:2,bilitot:2,prot:2,albumin:2)@    No results found for this or any previous visit (from the past 240 hour(s)).   Ct Abdomen Pelvis W Contrast  04/18/2012  *RADIOLOGY REPORT*  Clinical Data: Pain and vomiting.  CT ABDOMEN AND PELVIS WITH CONTRAST  Technique:  Multidetector CT imaging of the abdomen and pelvis was performed following the standard protocol during bolus administration of intravenous contrast.  Contrast: OMNIPAQUE IOHEXOL 300 MG/ML  SOLN  Comparison: None.  Findings: The lung bases are clear.  There is no evidence for free intraperitoneal air.  There is a small hiatal hernia.  There is a subtle 1.3 cm low density area in the anterior right hepatic lobe which is less conspicuous on the delayed images.  This is a nonspecific  finding but could represent a small hemangioma.  Otherwise, there is normal appearance of the liver and portal venous system.  There is a small low density focus in the pancreatic head that measures 0.8 cm and suggests focal fat.  No evidence of pancreatic duct dilatation.  There is a round 2.6 cm structure along the tail of the pancreas.  The spleen has been removed and suspect that this round lesion represents an accessory spleen or splenule.  Normal appearance of the adrenal glands and kidneys.  The uterus has a nodular appearance and there is a large calcified structure in the lower uterine segment suggestive for a  fibroid.  No evidence for free fluid.  There are small lymph nodes along the iliac nodal chains which are nonspecific.  Fluid within the urinary bladder.  There is marked sclerosis of the left femoral head with a subchondral fracture. No significant femoral head depression.  Both hips are located.  IMPRESSION: No acute findings within the abdomen and pelvis.  The spleen has been removed and there is 2.6 cm round structure along the tail the pancreas.  This most likely represents residual splenic tissue or a splenule.  However, the pancreas lesion cannot be completely excluded.  A heat damage red blood cell nuclear medicine scan could be used to confirm that this round structure is splenic tissue.  Left femoral head AVN with a subchondral fracture.  Uterine fibroids.  Small lymph nodes in the retroperitoneum are nonspecific.  Original Report Authenticated By: Richarda Overlie, M.D.   Impression: 1. Nausea and vomiting probably secondary diabetic gastroparesis 2. DM type II 3. Hypertension 4. DJD Active Problems:  * No active hospital problems. *      Plan: We will keep pt NPO We will continue IV fluid and antiemetics We will do GI consult    Eldridge Marcott   04/19/2012, 7:05 AM

## 2012-04-19 NOTE — Progress Notes (Signed)
UR Chart Review Completed  

## 2012-04-19 NOTE — Care Management Note (Signed)
    Page 1 of 1   04/21/2012     10:11:53 AM   CARE MANAGEMENT NOTE 04/21/2012  Patient:  [name], [name]   Account Number:  [id number]  Date Initiated:  04/19/2012  Documentation initiated by:  Sharrie Rothman  Subjective/Objective Assessment:   Pt admitted from home with n/v and abd pain. Pt lives with daughter and will return home with daughter at discharge. Pt uses a cane for prn use. Pt is fairly independent with ADL's.     Action/Plan:   No CM/HH needs noted.   Anticipated DC Date:  04/20/2012   Anticipated DC Plan:  HOME/SELF CARE      DC Planning Services  CM consult      Choice offered to / List presented to:             Status of service:  Completed, signed off Medicare Important Message given?   (If response is "NO", the following Medicare IM given date fields will be blank) Date Medicare IM given:   Date Additional Medicare IM given:    Discharge Disposition:  HOME/SELF CARE  Per UR Regulation:    If discussed at Long Length of Stay Meetings, dates discussed:    Comments:  04/21/12 1010 Arlyss Queen, RN BSN CM  04/19/12 1431 Arlyss Queen, RN BSN CM

## 2012-04-20 ENCOUNTER — Encounter (HOSPITAL_COMMUNITY): Payer: Self-pay | Admitting: *Deleted

## 2012-04-20 ENCOUNTER — Encounter (HOSPITAL_COMMUNITY)
Admission: EM | Disposition: A | Discharge status: Home / Self Care | Payer: Self-pay | Source: Home / Self Care | Attending: Emergency Medicine

## 2012-04-20 DIAGNOSIS — K2970 Gastritis, unspecified, without bleeding: Secondary | ICD-10-CM

## 2012-04-20 DIAGNOSIS — K228 Other specified diseases of esophagus: Secondary | ICD-10-CM

## 2012-04-20 DIAGNOSIS — K2990 Gastroduodenitis, unspecified, without bleeding: Secondary | ICD-10-CM

## 2012-04-20 HISTORY — PX: ESOPHAGOGASTRODUODENOSCOPY: SHX5428

## 2012-04-20 LAB — GLUCOSE, CAPILLARY
Glucose-Capillary: 140 mg/dL — ABNORMAL HIGH (ref 70–99)
Glucose-Capillary: 103 mg/dL — ABNORMAL HIGH (ref 70–99)
Glucose-Capillary: 105 mg/dL — ABNORMAL HIGH (ref 70–99)
Glucose-Capillary: 170 mg/dL — ABNORMAL HIGH (ref 70–99)

## 2012-04-20 SURGERY — EGD (ESOPHAGOGASTRODUODENOSCOPY)
Anesthesia: Moderate Sedation

## 2012-04-20 MED ORDER — BUTAMBEN-TETRACAINE-BENZOCAINE 2-2-14 % EX AERO
INHALATION_SPRAY | CUTANEOUS | Status: DC | PRN
  Administered 2012-04-20: 2 via TOPICAL

## 2012-04-20 MED ORDER — MEPERIDINE HCL 50 MG/ML IJ SOLN
INTRAMUSCULAR | Status: AC
  Filled 2012-04-20: qty 1

## 2012-04-20 MED ORDER — MEPERIDINE HCL 25 MG/ML IJ SOLN
INTRAMUSCULAR | Status: DC | PRN
  Administered 2012-04-20 (×2): 25 mg via INTRAVENOUS

## 2012-04-20 MED ORDER — PANTOPRAZOLE SODIUM 40 MG PO TBEC
40.0000 mg | DELAYED_RELEASE_TABLET | Freq: Every day | ORAL | Status: DC
  Administered 2012-04-21: 40 mg via ORAL
  Filled 2012-04-20: qty 1

## 2012-04-20 MED ORDER — SODIUM CHLORIDE 0.9 % IJ SOLN
INTRAMUSCULAR | Status: AC
  Filled 2012-04-20: qty 3

## 2012-04-20 MED ORDER — STERILE WATER FOR IRRIGATION IR SOLN
Status: DC | PRN
  Administered 2012-04-20: 10:00:00

## 2012-04-20 MED ORDER — MIDAZOLAM HCL 5 MG/5ML IJ SOLN
INTRAMUSCULAR | Status: DC | PRN
  Administered 2012-04-20 (×2): 2 mg via INTRAVENOUS

## 2012-04-20 MED ORDER — MIDAZOLAM HCL 5 MG/5ML IJ SOLN
INTRAMUSCULAR | Status: AC
  Filled 2012-04-20: qty 10

## 2012-04-20 NOTE — Consult Note (Signed)
[name], [name]:  [id number]  MEDICAL RECORD NO.:  [id number]  LOCATION:  A202                          FACILITY:  [facility]  PHYSICIAN:  Lionel December, M.D.    DATE OF BIRTH:  1951/04/03  DATE OF CONSULTATION:  04/19/2012 DATE OF DISCHARGE:                                CONSULTATION   REASON FOR CONSULTATION:  Nausea and vomiting.  HISTORY OF PRESENT ILLNESS:  The patient is a 61 year old African American female who was admitted to Dr. Letitia [facility] yesterday via emergency room where she presented with acute onset of nausea and vomiting.  She had 7 or 8 episodes of vomiting on waking up.  She did not vomit any food, she vomited liquids and bile.  She states when she went to bed the day before yesterday she was fine.  She got sick even before she had anything to eat or drink.  She also noted pain in left upper quadrant of her abdomen.  She did not experience hematemesis, fever, chills, hematuria, dysuria, diarrhea, or rectal bleeding.  She was begun on IV fluids and a PPI.  This afternoon, she feels much better.  She is tolerating clear liquids.  She wants to try diabetic diet.  She states she had similar episode 3 weeks ago, but she decided to stay at home and got better after a day or two.  She denies recent weight loss.  She says her appetite generally has been good.  She experiences heartburn sporadically.  She has noted decrease in the frequency of her bowel movements.  Her baseline was one every other day and over the last couple of months, she is generally having 2 bowel movements per week.  She denies rectal bleeding.  She has never been screened for colorectal carcinoma.  She also denies chest pain or shortness of breath.  She wonders if her symptoms are due to gallbladder disease.  Dr. Felecia Shelling is concerned she may have diabetic gastroparesis.  HOME MEDICATIONS: 1. Ibuprofen 4 mg daily p.r.n. which she takes once or twice a week. 2. Humalog 5  units with each meal. 3. Stool softener 100 mg p.o. daily. 4. Lantus 35 units subcu b.i.d. 5. Lisinopril 10 mg p.o. daily.  Current medications include: 1. Pantoprazole 40 mg IV q.24 hours. 2. Docusate sodium 100 mg p.o. daily. 3. Glargine or Lantus insulin 35 units subcu b.i.d. 4. Aspartate insulin via sliding scale every 6 hours.  P.r.n. medications include hydromorphone, labetalol, and ondansetron.  PAST MEDICAL HISTORY: 1. She was diagnosed with duodenal ulcer when she was 61 years old.     H. pylori status is unknown.  She has been diabetic for about 6     years.  She states control has not been satisfactory, I do not see     any hemoglobin A1c under lab data.  History of thrombocytopenic     purpura and she had splenectomy in October 2006.  She had emergency     laparotomy for ruptured tubal pregnancy about 30 years ago.  She     also gives history of short-term memory loss. 2. Obesity. 3. Osteoarthrosis.  ALLERGIES:  NK.  FAMILY HISTORY:  Father died of problems related to excessive alcohol use at age 14, she had no contact with him.  Mother died of CHF at age 32. She lost 1 brother in his 43s due to ruptured intracranial aneurysm. Another brother has hypertension, but in good health.  SOCIAL HISTORY:  She is divorced.  She has 3 children.  Her daughter is diabetic and other daughter and son are in good health.  She worked as a Building surveyor for 7 years, but now disabled.  She smoked cigarettes for over 10 years no more than 1 or 2 cigarettes a day and quit at age 3.  She has never drank alcohol.  PHYSICAL EXAMINATION:  VITAL SIGNS:  Admission weight 225 pounds, she 66 inches tall.  Pulse 85 per minute, blood pressure 160/94, respirations 18, and temp is 98.2. HEENT:  Conjunctivae is pink.  Sclerae nonicteric.  Oropharyngeal mucosa is normal.  No neck masses or thyromegaly noted. CARDIAC:  Regular rhythm.  Normal S1, S2.  No murmur or gallop noted. LUNGS:  Clear  to auscultation. ABDOMEN:  Full.  Bowel sounds are normal.  Abdomen is soft with thickening to skin of the umbilicus.  She has mild tenderness at LLQ in epigastric region.  No organomegaly or masses. RECTAL:  Deferred. EXTREMITIES:  No peripheral edema or clubbing noted.  LABORATORY DATA:  From admission, WBC 10.7, H and H 13.3 and 40.1, platelet count 247,000.  Electrolytes within normal limits.  Glucose is 76, BUN 10, creatinine 0.57, calcium 9.4.  Bilirubin 0.3, AP 85, AST 15, ALT 14, total protein 8.6, and albumin of 3.9.  Serum lipase was 17. Lactic acid level was 1.3, which is normal.  Urinalysis negative for nitrites or leukocytes, but positive for glucose.  She had abdominopelvic CT with contrast on admission revealing small sliding hiatal hernia 13 mm density in anterior right hepatic lobe, nonspecific possible small meningioma.  There is small low-density focus in the pancreatic head, felt to be focal fat.  Small obstruction noted in the tail of pancreas, felt to be granule.  Uterus with nodular appearance and large calcified structure felt to be fibroids.  Small lymph nodes in retroperitoneum felt to be nonspecific and avascular necrosis to left femoral head with subchondral fracture.  ASSESSMENT:  The patient is a 61 year old African American female who presents with acute onset of nausea, vomiting, and left-sided abdominal pain.  Symptoms have improved with supportive therapy.  She had similar episode 3 weeks ago.  History is pertinent for duodenal ulcer diagnosed at age 38.  CT did not reveal any changes to account for acute symptoms.  I agree with Dr. Felecia Shelling that she may well have diabetic gastroparesis, but before considering emptying study, we need to rule out peptic ulcer disease. Similarly, we will need to rule out symptomatic cholelithiasis.  CT is negative, but would not pick up cholesterol stones.  RECOMMENDATIONS: 1. Diet advanced to low-sodium diabetic  diet. 2. Diagnostic esophagogastroduodenoscopy to be performed in a.m. 3. We will hold off Lovenox dose tonight. 4. I have reviewed the procedures with the patient and she is     agreeable. 5. I have also recommended that she consider screening colonoscopy on an     outpatient basis. 6. We appreciate the opportunity to participate in the care of this     nice lady.          ______________________________ Lionel December, M.D.     NR/MEDQ  D:  04/19/2012  T:  04/20/2012  Job:  [id number]  cc:   Dr. Juanetta Gosling

## 2012-04-20 NOTE — Progress Notes (Signed)
Subjective: Patient feels better today. Her nausea vomiting has subsided. She has tolerated .soft diet. She is scheduled for EGD today.   Objective: Vital signs in last 24 hours: Temp:  [98.2 F (36.8 C)-98.3 F (36.8 C)] 98.3 F (36.8 C) (08/01 0555) Pulse Rate:  [83-85] 83  (08/01 0555) Resp:  [18] 18  (08/01 0555) BP: (154-160)/(83-94) 154/83 mmHg (08/01 0555) SpO2:  [95 %-96 %] 96 % (08/01 0555) Weight change:  Last BM Date: 04/15/12  Intake/Output from previous day: 07/31 0701 - 08/01 0700 In: 2926.2 [P.O.:1320; I.V.:1606.2] Out: -   PHYSICAL EXAM General appearance: alert, cooperative and no distress Resp: clear to auscultation bilaterally Cardio: regular rate and rhythm, S1, S2 normal, no murmur, click, rub or gallop GI: soft, non-tender; bowel sounds normal; no masses,  no organomegaly Extremities: extremities normal, atraumatic, no cyanosis or edema  Lab Results:    @labtest @ [facility]GS No results found for this basename: PHART,PCO2,PO2ART,TCO2,HCO3 in the last 72 hours CULTURES Recent Results (from the past 240 hour(s))  URINE CULTURE     Status: Normal   Collection Time   04/18/12  1:16 PM      Component Value Range Status Comment   Specimen Description URINE, CLEAN CATCH   Final    Special Requests NONE   Final    Culture  Setup Time 04/19/2012 02:10   Final    Colony Count 50,000 COLONIES/ML   Final    Culture     Final    Value: Multiple bacterial morphotypes present, none predominant. Suggest appropriate recollection if clinically indicated.   Report Status 04/19/2012 FINAL   Final    Studies/Results: Ct Abdomen Pelvis W Contrast  04/18/2012  *RADIOLOGY REPORT*  Clinical Data: Pain and vomiting.  CT ABDOMEN AND PELVIS WITH CONTRAST  Technique:  Multidetector CT imaging of the abdomen and pelvis was performed following the standard protocol during bolus administration of intravenous contrast.  Contrast: OMNIPAQUE IOHEXOL 300 MG/ML  SOLN  Comparison:  None.  Findings: The lung bases are clear.  There is no evidence for free intraperitoneal air.  There is a small hiatal hernia.  There is a subtle 1.3 cm low density area in the anterior right hepatic lobe which is less conspicuous on the delayed images.  This is a nonspecific finding but could represent a small hemangioma.  Otherwise, there is normal appearance of the liver and portal venous system.  There is a small low density focus in the pancreatic head that measures 0.8 cm and suggests focal fat.  No evidence of pancreatic duct dilatation.  There is a round 2.6 cm structure along the tail of the pancreas.  The spleen has been removed and suspect that this round lesion represents an accessory spleen or splenule.  Normal appearance of the adrenal glands and kidneys.  The uterus has a nodular appearance and there is a large calcified structure in the lower uterine segment suggestive for a fibroid.  No evidence for free fluid.  There are small lymph nodes along the iliac nodal chains which are nonspecific.  Fluid within the urinary bladder.  There is marked sclerosis of the left femoral head with a subchondral fracture. No significant femoral head depression.  Both hips are located.  IMPRESSION: No acute findings within the abdomen and pelvis.  The spleen has been removed and there is 2.6 cm round structure along the tail the pancreas.  This most likely represents residual splenic tissue or a splenule.  However, the pancreas lesion cannot  be completely excluded.  A heat damage red blood cell nuclear medicine scan could be used to confirm that this round structure is splenic tissue.  Left femoral head AVN with a subchondral fracture.  Uterine fibroids.  Small lymph nodes in the retroperitoneum are nonspecific.  Original Report Authenticated By: Richarda Overlie, M.D.    Medications: I have reviewed the patient's current medications.  Assesment: 1. Nausea and vomiting secondary to diabetic gastoparesiss 2. DM type  II 3. Hypertension Active Problems:  * No active hospital problems. *     Plan: We will start on clear liquid diet GI consult appreciated EGD as planned.    LOS: 2 days   [name] 04/20/2012, 7:14 AM

## 2012-04-20 NOTE — Op Note (Signed)
EGD PROCEDURE REPORT  PATIENT:  [name]  MR#:  [id number] Birthdate:  04-02-1951, 61 y.o., female Endoscopist:  Dr. Malissa Hippo, MD Referred By:  Dr. Avon Gully, MD Procedure Date: 04/20/2012  Procedure:   EGD  Indications:  Patient is 61 year old African female with second episode of nausea vomiting abdominal pain or the last 3 weeks. She is undergoing diagnostic EGD.            Informed Consent:  The risks, benefits, alternatives & imponderables which include, but are not limited to, bleeding, infection, perforation, drug reaction and potential missed lesion have been reviewed.  The potential for biopsy, lesion removal, esophageal dilation, etc. have also been discussed.  Questions have been answered.  All parties agreeable.  Please see history & physical in medical record for more information.  Medications:  Demerol 50 mg IV Versed 4 mg IV Cetacaine spray topically for oropharyngeal anesthesia  Description of procedure:  The endoscope was introduced through the mouth and advanced to the second portion of the duodenum without difficulty or limitations. The mucosal surfaces were surveyed very carefully during advancement of the scope and upon withdrawal.  Findings:  Esophagus:  Mucosa of the esophagus was normal. Erosion noted at GE junction. GEJ:  40 cm Stomach:  Stomach was empty and distended very well with insufflation. Folds in the proximal stomach were normal. Examination mucosa at body was normal. Antral mucosa revealed focal erythema and single erosion. Pyloric channel was patent. Angularis, fundus and cardia were examined by retroflexing the scope and were normal. Duodenum:  Normal bulbar and post bulbar mucosa.  Therapeutic/Diagnostic Maneuvers Performed:  None  Complications:  None  Impression: Erosive reflux esophagitis. Erosive antral gastritis. Evidence of pyloric stenosis or peptic ulcer.  Recommendations:  Change pantoprazole to oral route. H. pylori  serology. Solid phase gastric emptying study as an outpatient. If emptying study is normal will need upper abdominal ultrasound.  REHMAN,NAJEEB U  04/20/2012  10:29 AM  CC: Dr. Fredirick Maudlin, MD & Dr. Bonnetta Barry ref. provider found

## 2012-04-21 ENCOUNTER — Encounter (HOSPITAL_COMMUNITY): Payer: Self-pay | Admitting: Internal Medicine

## 2012-04-21 LAB — GLUCOSE, CAPILLARY
Glucose-Capillary: 119 mg/dL — ABNORMAL HIGH (ref 70–99)
Glucose-Capillary: 152 mg/dL — ABNORMAL HIGH (ref 70–99)

## 2012-04-21 LAB — H. PYLORI ANTIBODY, IGG: H Pylori IgG: 0.73 {ISR}

## 2012-04-21 MED ORDER — PANTOPRAZOLE SODIUM 40 MG PO TBEC: 40.0000 mg | DELAYED_RELEASE_TABLET | Freq: Every day | ORAL | Status: DC

## 2012-04-21 NOTE — Discharge Planning (Signed)
Physician Discharge Summary  Patient ID: [name] MRN: [id number] DOB/AGE: 1951-09-17 61 y.o. Primary Care Physician:HAWKINS,EDWARD L, MD Admit date: 04/18/2012 Discharge date: 04/21/2012    Discharge Diagnoses:  1.Errrosive esophagitis 2. Nausea vomiting secondary to the above 3. DM type II 4. Hypertension Active Problems:  * No active hospital problems. *    Medication List  As of 04/21/2012  8:34 AM   STOP taking these medications         ibuprofen 200 MG tablet         TAKE these medications         insulin glargine 100 UNIT/ML injection   Commonly known as: LANTUS   Inject 35 Units into the skin 2 (two) times daily.      insulin lispro 100 UNIT/ML injection   Commonly known as: HUMALOG   Inject 5 Units into the skin 3 (three) times daily before meals.      lisinopril 10 MG tablet   Commonly known as: PRINIVIL,ZESTRIL   Take 10 mg by mouth daily.      pantoprazole 40 MG tablet   Commonly known as: PROTONIX   Take 1 tablet (40 mg total) by mouth daily.      STOOL SOFTENER 100 MG capsule   Generic drug: docusate sodium   Take 100 mg by mouth as needed. For relief            Discharged Condition: stable    Consults: GI  Significant Diagnostic Studies: Ct Abdomen Pelvis W Contrast  04/18/2012  *RADIOLOGY REPORT*  Clinical Data: Pain and vomiting.  CT ABDOMEN AND PELVIS WITH CONTRAST  Technique:  Multidetector CT imaging of the abdomen and pelvis was performed following the standard protocol during bolus administration of intravenous contrast.  Contrast: OMNIPAQUE IOHEXOL 300 MG/ML  SOLN  Comparison: None.  Findings: The lung bases are clear.  There is no evidence for free intraperitoneal air.  There is a small hiatal hernia.  There is a subtle 1.3 cm low density area in the anterior right hepatic lobe which is less conspicuous on the delayed images.  This is a nonspecific finding but could represent a small hemangioma.  Otherwise, there is normal  appearance of the liver and portal venous system.  There is a small low density focus in the pancreatic head that measures 0.8 cm and suggests focal fat.  No evidence of pancreatic duct dilatation.  There is a round 2.6 cm structure along the tail of the pancreas.  The spleen has been removed and suspect that this round lesion represents an accessory spleen or splenule.  Normal appearance of the adrenal glands and kidneys.  The uterus has a nodular appearance and there is a large calcified structure in the lower uterine segment suggestive for a fibroid.  No evidence for free fluid.  There are small lymph nodes along the iliac nodal chains which are nonspecific.  Fluid within the urinary bladder.  There is marked sclerosis of the left femoral head with a subchondral fracture. No significant femoral head depression.  Both hips are located.  IMPRESSION: No acute findings within the abdomen and pelvis.  The spleen has been removed and there is 2.6 cm round structure along the tail the pancreas.  This most likely represents residual splenic tissue or a splenule.  However, the pancreas lesion cannot be completely excluded.  A heat damage red blood cell nuclear medicine scan could be used to confirm that this round structure is splenic tissue.  Left femoral head AVN with a subchondral fracture.  Uterine fibroids.  Small lymph nodes in the retroperitoneum are nonspecific.  Original Report Authenticated By: Richarda Overlie, M.D.    Lab Results: Basic Metabolic Panel:  Basename 04/18/12 1136  NA 136  K 3.9  CL 101  CO2 25  GLUCOSE 276*  BUN 10  CREATININE 0.57  CALCIUM 9.4  MG --  PHOS --   Liver Function Tests:  Basename 04/18/12 1136  AST 15  ALT 14  ALKPHOS 85  BILITOT 0.3  PROT 8.6*  ALBUMIN 3.9     CBC:  Basename 04/18/12 1136  WBC 10.7*  NEUTROABS 8.9*  HGB 13.3  HCT 40.1  MCV 81.0  PLT 247    Recent Results (from the past 240 hour(s))  URINE CULTURE     Status: Normal   Collection  Time   04/18/12  1:16 PM      Component Value Range Status Comment   Specimen Description URINE, CLEAN CATCH   Final    Special Requests NONE   Final    Culture  Setup Time 04/19/2012 02:10   Final    Colony Count 50,000 COLONIES/ML   Final    Culture     Final    Value: Multiple bacterial morphotypes present, none predominant. Suggest appropriate recollection if clinically indicated.   Report Status 04/19/2012 FINAL   Final      Hospital Course:  This is a 61 years old female patient admitted due to nausea and vomiting. She was seen by GI and EGD was done patient is found to have erosive esophagitis . Patient was started on PPI and being discharged in stable condition.   Discharge Exam: Blood pressure 142/83, pulse 90, temperature 98.5 F (36.9 C), temperature source Oral, resp. rate 16, height 5\' 6"  (1.676 m), weight 102.1 kg (225 lb 1.4 oz), SpO2 97.00%.   Disposition: stable    Follow-up Information    Follow up with HAWKINS,EDWARD L, MD in 2 weeks.   Contact information:   [address] [place] [address] [phone]          Signed: Avon Gully   04/21/2012, 8:34 AM

## 2012-04-21 NOTE — Progress Notes (Signed)
UR Chart Review Completed  

## 2012-04-21 NOTE — Progress Notes (Signed)
IV removed, site WNL.  Pt given d/c instructions and new prescriptions called into [facility] by MD.  Discussed home care with patient and discussed home medications, patient verbalizes understanding, teachback completed. F/U appointment to be made by pt, MD's office is closed at this time, pt states they will make and keep appointment. Pt is stable at this time. Pt waiting on ride to pick her up.

## 2012-07-08 ENCOUNTER — Inpatient Hospital Stay (HOSPITAL_COMMUNITY)
Admission: EM | Admit: 2012-07-08 | Discharge: 2012-07-10 | DRG: 073 | Disposition: A | Discharge status: Home / Self Care | Payer: Medicaid Other | Source: Ambulatory Visit | Attending: Pulmonary Disease | Admitting: Pulmonary Disease

## 2012-07-08 ENCOUNTER — Emergency Department (HOSPITAL_COMMUNITY): Payer: Medicaid Other

## 2012-07-08 ENCOUNTER — Encounter (HOSPITAL_COMMUNITY): Payer: Self-pay | Admitting: Emergency Medicine

## 2012-07-08 DIAGNOSIS — M311 Thrombotic microangiopathy, unspecified: Secondary | ICD-10-CM | POA: Diagnosis present

## 2012-07-08 DIAGNOSIS — E1149 Type 2 diabetes mellitus with other diabetic neurological complication: Principal | ICD-10-CM | POA: Diagnosis present

## 2012-07-08 DIAGNOSIS — K2100 Gastro-esophageal reflux disease with esophagitis, without bleeding: Secondary | ICD-10-CM | POA: Diagnosis present

## 2012-07-08 DIAGNOSIS — E871 Hypo-osmolality and hyponatremia: Secondary | ICD-10-CM

## 2012-07-08 DIAGNOSIS — R112 Nausea with vomiting, unspecified: Secondary | ICD-10-CM

## 2012-07-08 DIAGNOSIS — R109 Unspecified abdominal pain: Secondary | ICD-10-CM | POA: Diagnosis present

## 2012-07-08 DIAGNOSIS — I10 Essential (primary) hypertension: Secondary | ICD-10-CM | POA: Diagnosis present

## 2012-07-08 DIAGNOSIS — E669 Obesity, unspecified: Secondary | ICD-10-CM | POA: Diagnosis present

## 2012-07-08 DIAGNOSIS — K3184 Gastroparesis: Secondary | ICD-10-CM | POA: Diagnosis present

## 2012-07-08 DIAGNOSIS — Z6835 Body mass index (BMI) 35.0-35.9, adult: Secondary | ICD-10-CM

## 2012-07-08 DIAGNOSIS — E860 Dehydration: Secondary | ICD-10-CM

## 2012-07-08 DIAGNOSIS — K2970 Gastritis, unspecified, without bleeding: Secondary | ICD-10-CM | POA: Diagnosis present

## 2012-07-08 HISTORY — DX: Ulcer of esophagus without bleeding: K22.10

## 2012-07-08 HISTORY — DX: Gastritis, unspecified, without bleeding: K29.70

## 2012-07-08 LAB — GLUCOSE, CAPILLARY: Glucose-Capillary: 207 mg/dL — ABNORMAL HIGH (ref 70–99)

## 2012-07-08 LAB — LIPASE, BLOOD: Lipase: 17 U/L (ref 11–59)

## 2012-07-08 LAB — URINALYSIS, ROUTINE W REFLEX MICROSCOPIC
Glucose, UA: NEGATIVE mg/dL
Hgb urine dipstick: NEGATIVE
Ketones, ur: NEGATIVE mg/dL
Leukocytes, UA: NEGATIVE
Protein, ur: NEGATIVE mg/dL
Urobilinogen, UA: 0.2 mg/dL (ref 0.0–1.0)

## 2012-07-08 LAB — COMPREHENSIVE METABOLIC PANEL
ALT: 26 U/L (ref 0–35)
BUN: 8 mg/dL (ref 6–23)
CO2: 22 mEq/L (ref 19–32)
Calcium: 9.5 mg/dL (ref 8.4–10.5)
Creatinine, Ser: 0.55 mg/dL (ref 0.50–1.10)
GFR calc Af Amer: >90 mL/min (ref >90)
GFR calc non Af Amer: >90 mL/min (ref >90)
Glucose, Bld: 229 mg/dL — ABNORMAL HIGH (ref 70–99)
Sodium: 127 mEq/L — ABNORMAL LOW (ref 135–145)

## 2012-07-08 LAB — CBC WITH DIFFERENTIAL/PLATELET
Eosinophils Absolute: 0 10*3/uL (ref 0.0–0.7)
Eosinophils Relative: 0 % (ref 0–5)
Lymphocytes Relative: 12 % (ref 12–46)
Lymphs Abs: 1 10*3/uL (ref 0.7–4.0)
Monocytes Absolute: 0.3 10*3/uL (ref 0.1–1.0)
Monocytes Relative: 4 % (ref 3–12)

## 2012-07-08 MED ORDER — ONDANSETRON HCL 4 MG/2ML IJ SOLN
4.0000 mg | INTRAMUSCULAR | Status: AC | PRN
  Administered 2012-07-08 (×2): 4 mg via INTRAVENOUS
  Filled 2012-07-08 (×2): qty 2

## 2012-07-08 MED ORDER — INSULIN ASPART 100 UNIT/ML ~~LOC~~ SOLN
4.0000 [IU] | Freq: Three times a day (TID) | SUBCUTANEOUS | Status: DC
  Administered 2012-07-09 – 2012-07-10 (×4): 4 [IU] via SUBCUTANEOUS

## 2012-07-08 MED ORDER — SODIUM CHLORIDE 0.9 % IV SOLN
INTRAVENOUS | Status: DC
  Administered 2012-07-08 – 2012-07-09 (×3): via INTRAVENOUS

## 2012-07-08 MED ORDER — METOCLOPRAMIDE HCL 5 MG/ML IJ SOLN
5.0000 mg | Freq: Four times a day (QID) | INTRAMUSCULAR | Status: DC
  Administered 2012-07-09 – 2012-07-10 (×6): 5 mg via INTRAVENOUS
  Filled 2012-07-08 (×6): qty 2

## 2012-07-08 MED ORDER — FAMOTIDINE IN NACL 20-0.9 MG/50ML-% IV SOLN
20.0000 mg | Freq: Once | INTRAVENOUS | Status: AC
  Administered 2012-07-08: 20 mg via INTRAVENOUS
  Filled 2012-07-08: qty 50

## 2012-07-08 MED ORDER — DOCUSATE SODIUM 100 MG PO CAPS
100.0000 mg | ORAL_CAPSULE | Freq: Two times a day (BID) | ORAL | Status: DC
  Administered 2012-07-09 – 2012-07-10 (×3): 100 mg via ORAL
  Filled 2012-07-08 (×8): qty 1

## 2012-07-08 MED ORDER — ONDANSETRON HCL 4 MG/2ML IJ SOLN: 4.0000 mg | Freq: Four times a day (QID) | INTRAMUSCULAR | Status: DC | PRN

## 2012-07-08 MED ORDER — INSULIN ASPART 100 UNIT/ML ~~LOC~~ SOLN
0.0000 [IU] | Freq: Every day | SUBCUTANEOUS | Status: DC
  Administered 2012-07-09: 2 [IU] via SUBCUTANEOUS

## 2012-07-08 MED ORDER — IOHEXOL 300 MG/ML  SOLN
100.0000 mL | Freq: Once | INTRAMUSCULAR | Status: AC | PRN
  Administered 2012-07-08: 100 mL via INTRAVENOUS

## 2012-07-08 MED ORDER — ACETAMINOPHEN 500 MG PO TABS
1000.0000 mg | ORAL_TABLET | Freq: Four times a day (QID) | ORAL | Status: DC | PRN
  Administered 2012-07-09 – 2012-07-10 (×2): 1000 mg via ORAL
  Filled 2012-07-08 (×2): qty 2

## 2012-07-08 MED ORDER — METOCLOPRAMIDE HCL 5 MG/ML IJ SOLN
10.0000 mg | Freq: Once | INTRAMUSCULAR | Status: AC
  Administered 2012-07-08: 10 mg via INTRAVENOUS
  Filled 2012-07-08: qty 2

## 2012-07-08 MED ORDER — PANTOPRAZOLE SODIUM 40 MG IV SOLR
40.0000 mg | INTRAVENOUS | Status: DC
  Administered 2012-07-09 (×2): 40 mg via INTRAVENOUS
  Filled 2012-07-08 (×2): qty 40

## 2012-07-08 MED ORDER — FENTANYL CITRATE 0.05 MG/ML IJ SOLN
50.0000 ug | INTRAMUSCULAR | Status: DC | PRN
  Administered 2012-07-08: 50 ug via INTRAVENOUS
  Filled 2012-07-08: qty 2

## 2012-07-08 MED ORDER — INSULIN ASPART 100 UNIT/ML ~~LOC~~ SOLN
0.0000 [IU] | Freq: Three times a day (TID) | SUBCUTANEOUS | Status: DC
  Administered 2012-07-09 (×2): 3 [IU] via SUBCUTANEOUS
  Administered 2012-07-09: 2 [IU] via SUBCUTANEOUS
  Administered 2012-07-10 (×2): 3 [IU] via SUBCUTANEOUS

## 2012-07-08 NOTE — ED Provider Notes (Signed)
History     CSN: [id number]  Arrival date & time 07/08/12  1722   First MD Initiated Contact with Patient 07/08/12 1814      Chief Complaint  Patient presents with  . Abdominal Pain     HPI Pt was seen at 1820.  Per pt, c/o gradual onset and persistence of constant upper abd "pain" for the past 2 days.  Has been associated with multiple intermittent episodes of N/V.  Denies fevers, no back pain, no CP/SOB, no diarrhea, no black or blood in stools or emesis.     Past Medical History  Diagnosis Date  . Diabetes mellitus   . Hypertension   . Arthritis   . TTP (thrombotic thrombocytopenic purpura)   . Erosive esophagitis   . Gastritis     Past Surgical History  Procedure Date  . Splenectomy, partial   . Ectopic pregnancy surgery   . Esophagogastroduodenoscopy 04/20/2012    Procedure: ESOPHAGOGASTRODUODENOSCOPY (EGD);  Surgeon: Malissa Hippo, MD;  Location: [facility];  Service: Endoscopy;  Laterality: N/A;    Family History  Problem Relation Age of Onset  . Heart failure Mother   . Cancer Mother     History  Substance Use Topics  . Smoking status: Former Smoker    Types: Cigarettes  . Smokeless tobacco: Not on file  . Alcohol Use: No    Review of Systems ROS: Statement: All systems negative except as marked or noted in the HPI; Constitutional: Negative for fever and chills. ; ; Eyes: Negative for eye pain, redness and discharge. ; ; ENMT: Negative for ear pain, hoarseness, nasal congestion, sinus pressure and sore throat. ; ; Cardiovascular: Negative for chest pain, palpitations, diaphoresis, dyspnea and peripheral edema. ; ; Respiratory: Negative for cough, wheezing and stridor. ; ; Gastrointestinal: +abd pain, N/V.  Negative for diarrhea, blood in stool, hematemesis, jaundice and rectal bleeding. . ; ; Genitourinary: Negative for dysuria, flank pain and hematuria. ; ; Musculoskeletal: Negative for back pain and neck pain. Negative for swelling and trauma.; ;  Skin: Negative for pruritus, rash, abrasions, blisters, bruising and skin lesion.; ; Neuro: Negative for headache, lightheadedness and neck stiffness. Negative for weakness, altered level of consciousness , altered mental status, extremity weakness, paresthesias, involuntary movement, seizure and syncope.       Allergies  Review of patient's allergies indicates no known allergies.  Home Medications   Current Outpatient Rx  Name Route Sig Dispense Refill  . ACETAMINOPHEN 500 MG PO TABS Oral Take 1,000 mg by mouth every 6 (six) hours as needed. Pain.    Marland Kitchen DOCUSATE SODIUM 100 MG PO CAPS Oral Take 100 mg by mouth as needed. For relief    . INSULIN GLARGINE 100 UNIT/ML [place] SOLN Subcutaneous Inject 35 Units into the skin 2 (two) times daily.    . INSULIN LISPRO (HUMAN) 100 UNIT/ML [place] SOLN Subcutaneous Inject 5 Units into the skin 3 (three) times daily before meals.    Marland Kitchen PANTOPRAZOLE SODIUM 40 MG PO TBEC Oral Take 1 tablet (40 mg total) by mouth daily. 30 tablet 3    BP 169/92  Pulse 90  Temp 98.1 F (36.7 C) (Oral)  Resp 18  Ht 5\' 5"  (1.651 m)  Wt 225 lb (102.059 kg)  BMI 37.44 kg/m2  SpO2 100%  Physical Exam 1825: Physical examination:  Nursing notes reviewed; Vital signs and O2 SAT reviewed;  Constitutional: Well developed, Well nourished, In no acute distress; Head:  Normocephalic, atraumatic; Eyes: EOMI, PERRL,  No scleral icterus; ENMT: Mouth and pharynx normal, Mucous membranes dry; Neck: Supple, Full range of motion, No lymphadenopathy; Cardiovascular: Regular rate and rhythm, No gallop; Respiratory: Breath sounds clear & equal bilaterally, No wheezes.  Speaking full sentences with ease, Normal respiratory effort/excursion; Chest: Nontender, Movement normal; Abdomen: Soft, +mid-epigastric area tender to palp. No rebound or guarding. Nondistended, Normal bowel sounds;; Extremities: Pulses normal, No tenderness, No edema, No calf edema or asymmetry.; Neuro: AA&Ox3, Major CN grossly  intact.  Speech clear. No gross focal motor or sensory deficits in extremities.; Skin: Color normal, Warm, Dry.   ED Course  Procedures    MDM  MDM Reviewed: nursing note, vitals and previous chart Reviewed previous: ECG Interpretation: ECG, labs, x-ray and CT scan      Date: 07/08/2012  Rate: 85  Rhythm: normal sinus rhythm  QRS Axis: normal  Intervals: normal  ST/T Wave abnormalities: normal  Conduction Disutrbances:none  Narrative Interpretation:   Old EKG Reviewed: unchanged; no significant changes from previous EKG dated 04/18/2012.   Results for orders placed during the hospital encounter of 07/08/12  GLUCOSE, CAPILLARY      Component Value Range   Glucose-Capillary 207 (*) 70 - 99 mg/dL  CBC WITH DIFFERENTIAL      Component Value Range   WBC 9.0  4.0 - 10.5 K/uL   RBC 5.17 (*) 3.87 - 5.11 MIL/uL   Hemoglobin 13.9  12.0 - 15.0 g/dL   HCT 09.8  11.9 - 14.7 %   MCV 79.7  78.0 - 100.0 fL   MCH 26.9  26.0 - 34.0 pg   MCHC 33.7  30.0 - 36.0 g/dL   RDW 82.9  56.2 - 13.0 %   Platelets 244  150 - 400 K/uL   Neutrophils Relative 85 (*) 43 - 77 %   Neutro Abs 7.6  1.7 - 7.7 K/uL   Lymphocytes Relative 12  12 - 46 %   Lymphs Abs 1.0  0.7 - 4.0 K/uL   Monocytes Relative 4  3 - 12 %   Monocytes Absolute 0.3  0.1 - 1.0 K/uL   Eosinophils Relative 0  0 - 5 %   Eosinophils Absolute 0.0  0.0 - 0.7 K/uL   Basophils Relative 0  0 - 1 %   Basophils Absolute 0.0  0.0 - 0.1 K/uL  COMPREHENSIVE METABOLIC PANEL      Component Value Range   Sodium 127 (*) 135 - 145 mEq/L   Potassium 3.7  3.5 - 5.1 mEq/L   Chloride 93 (*) 96 - 112 mEq/L   CO2 22  19 - 32 mEq/L   Glucose, Bld 229 (*) 70 - 99 mg/dL   BUN 8  6 - 23 mg/dL   Creatinine, Ser 8.65  0.50 - 1.10 mg/dL   Calcium 9.5  8.4 - 78.4 mg/dL   Total Protein 8.7 (*) 6.0 - 8.3 g/dL   Albumin 3.6  3.5 - 5.2 g/dL   AST 19  0 - 37 U/L   ALT 26  0 - 35 U/L   Alkaline Phosphatase 85  39 - 117 U/L   Total Bilirubin 0.5  0.3 - 1.2  mg/dL   GFR calc non Af Amer >90  >90 mL/min   GFR calc Af Amer >90  >90 mL/min  LIPASE, BLOOD      Component Value Range   Lipase 17  11 - 59 U/L  TROPONIN I      Component Value Range   Troponin I <  0.30  <0.30 ng/mL   Ct Abdomen Pelvis W Contrast 07/08/2012  *RADIOLOGY REPORT*  Clinical Data: Generalized abdominal pain, nausea/vomiting  CT ABDOMEN AND PELVIS WITH CONTRAST  Technique:  Multidetector CT imaging of the abdomen and pelvis was performed following the standard protocol during bolus administration of intravenous contrast.  Contrast: OMNIPAQUE IOHEXOL 300 MG/ML  SOLN  Comparison: 04/18/2012  Findings: Lung bases are essentially clear.  Stable subcapsular hypodense lesion in the central liver (series 2/image 25) uncertain etiology, likely benign.  Status post splenectomy.  Suspected 2.3 cm splenule adjacent to the pancreatic tail (series 2/image 22).  Pancreas and adrenal glands are within normal limits.  Gallbladder is unremarkable.  No intrahepatic or extrahepatic ductal dilatation.  Kidneys within normal limits.  No hydronephrosis.  No evidence of bowel obstruction.  Normal appendix.  No evidence of abdominal aortic aneurysm.  No abdominopelvic ascites.  No suspicious abdominopelvic lymphadenopathy.  Calcified uterine fibroids.  Ovaries are unremarkable.  Bladder is within normal limits.  Mild degenerative changes of the visualized thoracolumbar spine. Avascular necrosis involving the left femoral head.  IMPRESSION: No evidence of bowel obstruction.  Normal appendix.  No CT findings to account for the patient's abdominal pain.  Additional stable ancillary findings as above.   Original Report Authenticated By: Charline Bills, M.D.    Dg Abd Acute W/chest 07/08/2012  *RADIOLOGY REPORT*  Clinical Data: Abdominal pain with nausea and vomiting.  Rule out bowel obstruction  ACUTE ABDOMEN SERIES (ABDOMEN 2 VIEW & CHEST 1 VIEW)  Comparison: CT abdomen 04/18/2012  Findings: The lungs are  clear without infiltrate or effusion. Negative for heart failure.  Normal bowel gas pattern.  No air-fluid levels or free air.  Large calcified uterine fibroid is present.  No acute bony abnormality.  IMPRESSION: No acute abnormality in the chest or abdomen.   Original Report Authenticated By: Camelia Phenes, M.D.    Results for RYLIE, LIMBURG (MRN [id number]) as of 07/08/2012 22:08  Ref. Range 04/18/2012 11:36 07/08/2012 18:22  Sodium Latest Range: 135-145 mEq/L 136 127 (L)  Chloride Latest Range: 96-112 mEq/L 101 93 (L)     2155:  Pt continues to c/o nausea and vomiting despite IV zofran.  Symptoms may be due to gastroparesis, will try IV reglan.  Pt dehydrated via labs.  Will continue gentle rehydration.  Mild hyperglycemia, per hx DM.  Dx and testing d/w pt and family.  Questions answered.  Verb understanding, agreeable to admit.  T/C to Dr, Sudie Bailey, case discussed, including:  HPI, pertinent PM/SHx, VS/PE, dx testing, ED course and treatment:  Agreeable to admit, requests to write temporary orders, obtain medical bed.       Laray Anger, DO 07/10/12 1514

## 2012-07-08 NOTE — ED Notes (Signed)
Pt having difficulty tolerating CT contrast. Vomited small amount with increasing nausea per pt. Radiology notified.

## 2012-07-08 NOTE — ED Notes (Signed)
Patient ambulated to restroom tolerated well. 

## 2012-07-08 NOTE — ED Notes (Signed)
Assumed pt care at this time pt had a ekg ordered at 18:17 and was not done. Ekg done at this time by [employer] Kindt

## 2012-07-08 NOTE — ED Notes (Signed)
Receiving RN remains on break. Charge RN unable to take report secondary to "increased census at this time"

## 2012-07-08 NOTE — ED Notes (Signed)
Patient with c/o mid abdominal pain x 2 days. Patient with nausea/vomiting. Unable to keep fluids down per patient. Patient has not had diabetic medicine in the past several days.

## 2012-07-08 NOTE — ED Notes (Signed)
Pt given ginger ale, tolerated thus far.

## 2012-07-08 NOTE — ED Notes (Signed)
Receiving RN on break unable to take pt upstairs.

## 2012-07-08 NOTE — ED Notes (Signed)
Pt presents to ED with abdominal pain, N/V/D x several days with worsening abdominal pain today. Pt states takes protonix at home and this has not helped. Abdomin is soft rounded with positive BS. Pt is quiet, pallor in color And states belly hurts.  Pt has history of gastroptosis. Denies fever. Pt is alert oriented x 4 with no acute distress noted. Side rails up per safety.

## 2012-07-09 ENCOUNTER — Encounter (HOSPITAL_COMMUNITY): Payer: Self-pay | Admitting: Family Medicine

## 2012-07-09 LAB — GLUCOSE, CAPILLARY
Glucose-Capillary: 155 mg/dL — ABNORMAL HIGH (ref 70–99)
Glucose-Capillary: 223 mg/dL — ABNORMAL HIGH (ref 70–99)

## 2012-07-09 MED ORDER — BIOTENE DRY MOUTH MT LIQD
15.0000 mL | Freq: Two times a day (BID) | OROMUCOSAL | Status: DC
  Administered 2012-07-09: 15 mL via OROMUCOSAL

## 2012-07-09 NOTE — Progress Notes (Addendum)
Patient tolerated carb modified diet for lunch and supper with no complaints.

## 2012-07-09 NOTE — H&P (Signed)
[name], [name]:  [id number]  MEDICAL RECORD NO.:  [id number]  LOCATION:  A310                          FACILITY:  [facility]  PHYSICIAN:  Mila Homer. Sudie Bailey, M.D.DATE OF BIRTH:  1951-06-22  DATE OF ADMISSION:  07/08/2012 DATE OF DISCHARGE:  LH                             HISTORY & PHYSICAL   This 61 year old woman developed abdominal pain with vomiting starting several days ago.  She was brought into the emergency room last evening.  In early August, she had a similar problem.  It was initially thought she might have diabetic gastroparesis.  EGD did show erosive esophagitis.  She was put on a PPI at that time. I started her on metoclopramide last night, 5 mg IV q.6h.  Today she feels the nausea has cleared.  She feels much better and in fact, is hungry.  She has had no diarrhea with this.  She denies fever or chills.  Her history includes the history of erosive esophagitis but also gastritis, hypertension, and diabetes as noted.  Surgical history showed she had a partial splenectomy and also had surgery for an ectopic pregnancy in the past.  CURRENT MEDICATIONS: 1. Acetaminophen 1000 mg q.6h. for pain. 2. Docusate sodium 100 mg as needed for constipation. 3. Pantoprazole 40 mg daily. 4. Lantus insulin 35 units b.i.d. and Humalog insulin 5 units t.i.d.     before meals. 5. She may also have been on losartan 50 mg daily.  PHYSICAL EXAM:  A pleasant middle-aged woman whose temperature is 98 degrees, pulse 93, respiratory rate 20, blood pressure 120/83. Her speech was normal.  She seemed to be a good historian. Skin turgor is normal.  Mucous membranes moist. HEART:  Regular rhythm, a rate of 80 on my exam. LUNGS:  Clear throughout. ABDOMEN:  Soft without organomegaly or mass or tenderness today.  There is no edema of the ankles.  Her admission white cell count was 9000.  Hemoglobin 13.9.  She had 85% neutrophils.  Her admission sodium was 127 with  a chloride of 93, glucose of 229.  She has had sugars in the 100 range since admission.  Her admission UA was negative, and a urine culture is pending.  The CT of the abdomen and pelvis with contrast showed no CT findings to account for her abdominal pain.  She did have calcified uterine fibroids and was status post splenectomy, but it was noted she had a 2.3 cm splenule adjacent to the pancreatic tail.  She had a stable subcapsular hypodense lesion in the central liver, felt to be benign.  ADMISSION DIAGNOSES: 1. Nausea and vomiting, possibly secondary to diabetic gastroparesis. 2. Type 2 diabetes. 3. Obesity. 4. Reflux esophagitis. 5. History of thrombotic thrombocytopenic purpura. Today I am cutting back her IV's from 100-50 mL/hr.  We will get her up and around and get her on a modified carbohydrate diet.  I will continue with metoclopramide 5 mg IV q6h.  This could be switched to p.o. tomorrow if she is doing well.  She will be continued on sliding scale insulin.     Mila Homer. Sudie Bailey, M.D.     SDK/MEDQ  D:  07/09/2012  T:  07/09/2012  Job:  [id number]

## 2012-07-10 LAB — GLUCOSE, CAPILLARY: Glucose-Capillary: 170 mg/dL — ABNORMAL HIGH (ref 70–99)

## 2012-07-10 LAB — URINE CULTURE: Colony Count: 50000

## 2012-07-10 MED ORDER — METOCLOPRAMIDE HCL 5 MG PO TABS: 5.0000 mg | ORAL_TABLET | Freq: Four times a day (QID) | ORAL | Status: DC

## 2012-07-10 NOTE — Progress Notes (Signed)
Discharge instructions given, verbalized understanding, out via w/c in stable condition with staff.

## 2012-07-10 NOTE — Progress Notes (Signed)
Subjective: She says she feels much better. She wants to go home. She is able to eat.  Objective: Vital signs in last 24 hours: Temp:  [98.4 F (36.9 C)-98.5 F (36.9 C)] 98.4 F (36.9 C) (10/21 0418) Pulse Rate:  [88-91] 91  (10/21 0418) Resp:  [20] 20  (10/21 0418) BP: (128-144)/(79-88) 144/88 mmHg (10/21 0418) SpO2:  [93 %-95 %] 93 % (10/21 0418) Weight change:  Last BM Date: 07/09/12  Intake/Output from previous day: 10/20 0701 - 10/21 0700 In: 2197.5 [P.O.:830; I.V.:1367.5] Out: -   PHYSICAL EXAM General appearance: alert, cooperative and no distress Resp: clear to auscultation bilaterally Cardio: regular rate and rhythm, S1, S2 normal, no murmur, click, rub or gallop GI: soft, non-tender; bowel sounds normal; no masses,  no organomegaly Extremities: extremities normal, atraumatic, no cyanosis or edema  Lab Results:    Basic Metabolic Panel:  Basename 07/08/12 1822  NA 127*  K 3.7  CL 93*  CO2 22  GLUCOSE 229*  BUN 8  CREATININE 0.55  CALCIUM 9.5  MG --  PHOS --   Liver Function Tests:  [facility] 07/08/12 1822  AST 19  ALT 26  ALKPHOS 85  BILITOT 0.5  PROT 8.7*  ALBUMIN 3.6    Basename 07/08/12 1822  LIPASE 17  AMYLASE --   No results found for this basename: AMMONIA:2 in the last 72 hours CBC:  Basename 07/08/12 1822  WBC 9.0  NEUTROABS 7.6  HGB 13.9  HCT 41.2  MCV 79.7  PLT 244   Cardiac Enzymes:  Basename 07/08/12 1822  CKTOTAL --  CKMB --  CKMBINDEX --  TROPONINI <0.30   BNP: No results found for this basename: PROBNP:3 in the last 72 hours D-Dimer: No results found for this basename: DDIMER:2 in the last 72 hours CBG:  Basename 07/10/12 0743 07/09/12 2014 07/09/12 1548 07/09/12 1136 07/09/12 0740 07/09/12 0004  GLUCAP 174* 223* 121* 159* 164* 155*   Hemoglobin A1C: No results found for this basename: HGBA1C in the last 72 hours Fasting Lipid Panel: No results found for this basename:  CHOL,HDL,LDLCALC,TRIG,CHOLHDL,LDLDIRECT in the last 72 hours Thyroid Function Tests: No results found for this basename: TSH,T4TOTAL,FREET4,T3FREE,THYROIDAB in the last 72 hours Anemia Panel: No results found for this basename: VITAMINB12,FOLATE,FERRITIN,TIBC,IRON,RETICCTPCT in the last 72 hours Coagulation: No results found for this basename: LABPROT:2,INR:2 in the last 72 hours Urine Drug Screen: Drugs of Abuse  No results found for this basename: labopia, cocainscrnur, labbenz, amphetmu, thcu, labbarb    Alcohol Level: No results found for this basename: ETH:2 in the last 72 hours Urinalysis:  Basename 07/08/12 2155  COLORURINE YELLOW  LABSPEC 1.010  PHURINE 7.0  GLUCOSEU NEGATIVE  HGBUR NEGATIVE  BILIRUBINUR NEGATIVE  KETONESUR NEGATIVE  PROTEINUR NEGATIVE  UROBILINOGEN 0.2  NITRITE NEGATIVE  LEUKOCYTESUR NEGATIVE   Misc. Labs:  ABGS No results found for this basename: PHART,PCO2,PO2ART,TCO2,HCO3 in the last 72 hours CULTURES No results found for this or any previous visit (from the past 240 hour(s)). Studies/Results: Ct Abdomen Pelvis W Contrast  07/08/2012  *RADIOLOGY REPORT*  Clinical Data: Generalized abdominal pain, nausea/vomiting  CT ABDOMEN AND PELVIS WITH CONTRAST  Technique:  Multidetector CT imaging of the abdomen and pelvis was performed following the standard protocol during bolus administration of intravenous contrast.  Contrast: OMNIPAQUE IOHEXOL 300 MG/ML  SOLN  Comparison: 04/18/2012  Findings: Lung bases are essentially clear.  Stable subcapsular hypodense lesion in the central liver (series 2/image 25) uncertain etiology, likely benign.  Status post splenectomy.  Suspected 2.3 cm splenule adjacent to the pancreatic tail (series 2/image 22).  Pancreas and adrenal glands are within normal limits.  Gallbladder is unremarkable.  No intrahepatic or extrahepatic ductal dilatation.  Kidneys within normal limits.  No hydronephrosis.  No evidence of bowel  obstruction.  Normal appendix.  No evidence of abdominal aortic aneurysm.  No abdominopelvic ascites.  No suspicious abdominopelvic lymphadenopathy.  Calcified uterine fibroids.  Ovaries are unremarkable.  Bladder is within normal limits.  Mild degenerative changes of the visualized thoracolumbar spine. Avascular necrosis involving the left femoral head.  IMPRESSION: No evidence of bowel obstruction.  Normal appendix.  No CT findings to account for the patient's abdominal pain.  Additional stable ancillary findings as above.   Original Report Authenticated By: Charline Bills, M.D.    Dg Abd Acute W/chest  07/08/2012  *RADIOLOGY REPORT*  Clinical Data: Abdominal pain with nausea and vomiting.  Rule out bowel obstruction  ACUTE ABDOMEN SERIES (ABDOMEN 2 VIEW & CHEST 1 VIEW)  Comparison: CT abdomen 04/18/2012  Findings: The lungs are clear without infiltrate or effusion. Negative for heart failure.  Normal bowel gas pattern.  No air-fluid levels or free air.  Large calcified uterine fibroid is present.  No acute bony abnormality.  IMPRESSION: No acute abnormality in the chest or abdomen.   Original Report Authenticated By: Camelia Phenes, M.D.     Medications:  Prior to Admission:  Prescriptions prior to admission  Medication Sig Dispense Refill  . acetaminophen (TYLENOL) 500 MG tablet Take 1,000 mg by mouth every 6 (six) hours as needed. Pain.      . docusate sodium (STOOL SOFTENER) 100 MG capsule Take 100 mg by mouth as needed. For relief      . insulin glargine (LANTUS) 100 UNIT/ML injection Inject 35 Units into the skin 2 (two) times daily.      . insulin lispro (HUMALOG) 100 UNIT/ML injection Inject 5 Units into the skin 3 (three) times daily before meals.      Marland Kitchen losartan (COZAAR) 50 MG tablet Take 50 mg by mouth daily.      . pantoprazole (PROTONIX) 40 MG tablet Take 1 tablet (40 mg total) by mouth daily.  30 tablet  3   Scheduled:   . docusate sodium  100 mg Oral BID  . insulin aspart   0-15 Units Subcutaneous TID WC  . insulin aspart  0-5 Units Subcutaneous QHS  . insulin aspart  4 Units Subcutaneous TID WC  . metoCLOPramide (REGLAN) injection  5 mg Intravenous Q6H  . pantoprazole (PROTONIX) IV  40 mg Intravenous Q24H  . DISCONTD: antiseptic oral rinse  15 mL Mouth Rinse BID   Continuous:   . sodium chloride 50 mL/hr at 07/09/12 2127   JXB:[id number], fentaNYL, ondansetron  Assesment: She had abdominal pain probably multifactorial. Her she had erosive esophagitis. She says she feels much better on Reglan Active Problems:  * No active hospital problems. *     Plan: Discharge home on Reglan    LOS: 2 days   Chana Lindstrom L 07/10/2012, 8:55 AM

## 2012-07-12 NOTE — Discharge Summary (Signed)
Physician Discharge Summary  Patient ID: [name] MRN: [id number] DOB/AGE: May 29, 1951 61 y.o. Primary Care Physician:Donshay Lupinski L, MD Admit date: 07/08/2012 Discharge date: 07/12/2012    Discharge Diagnoses:  Abdominal pain Active Problems:  * No active hospital problems. *   Gastritis Diabetic gastroparesis Diabetes Hypertension hyponatremia   Medication List     As of 07/12/2012  8:10 AM    TAKE these medications         acetaminophen 500 MG tablet   Commonly known as: TYLENOL   Take 1,000 mg by mouth every 6 (six) hours as needed. Pain.      insulin glargine 100 UNIT/ML injection   Commonly known as: LANTUS   Inject 35 Units into the skin 2 (two) times daily.      insulin lispro 100 UNIT/ML injection   Commonly known as: HUMALOG   Inject 5 Units into the skin 3 (three) times daily before meals.      losartan 50 MG tablet   Commonly known as: COZAAR   Take 50 mg by mouth daily.      metoCLOPramide 5 MG tablet   Commonly known as: REGLAN   Take 1 tablet (5 mg total) by mouth 4 (four) times daily.      pantoprazole 40 MG tablet   Commonly known as: PROTONIX   Take 1 tablet (40 mg total) by mouth daily.      STOOL SOFTENER 100 MG capsule   Generic drug: docusate sodium   Take 100 mg by mouth as needed. For relief        Discharged Condition: Improved    Consults:none  Significant Diagnostic Studies: Ct Abdomen Pelvis W Contrast  07/08/2012  *RADIOLOGY REPORT*  Clinical Data: Generalized abdominal pain, nausea/vomiting  CT ABDOMEN AND PELVIS WITH CONTRAST  Technique:  Multidetector CT imaging of the abdomen and pelvis was performed following the standard protocol during bolus administration of intravenous contrast.  Contrast: OMNIPAQUE IOHEXOL 300 MG/ML  SOLN  Comparison: 04/18/2012  Findings: Lung bases are essentially clear.  Stable subcapsular hypodense lesion in the central liver (series 2/image 25) uncertain etiology, likely benign.   Status post splenectomy.  Suspected 2.3 cm splenule adjacent to the pancreatic tail (series 2/image 22).  Pancreas and adrenal glands are within normal limits.  Gallbladder is unremarkable.  No intrahepatic or extrahepatic ductal dilatation.  Kidneys within normal limits.  No hydronephrosis.  No evidence of bowel obstruction.  Normal appendix.  No evidence of abdominal aortic aneurysm.  No abdominopelvic ascites.  No suspicious abdominopelvic lymphadenopathy.  Calcified uterine fibroids.  Ovaries are unremarkable.  Bladder is within normal limits.  Mild degenerative changes of the visualized thoracolumbar spine. Avascular necrosis involving the left femoral head.  IMPRESSION: No evidence of bowel obstruction.  Normal appendix.  No CT findings to account for the patient's abdominal pain.  Additional stable ancillary findings as above.   Original Report Authenticated By: Charline Bills, M.D.    Dg Abd Acute W/chest  07/08/2012  *RADIOLOGY REPORT*  Clinical Data: Abdominal pain with nausea and vomiting.  Rule out bowel obstruction  ACUTE ABDOMEN SERIES (ABDOMEN 2 VIEW & CHEST 1 VIEW)  Comparison: CT abdomen 04/18/2012  Findings: The lungs are clear without infiltrate or effusion. Negative for heart failure.  Normal bowel gas pattern.  No air-fluid levels or free air.  Large calcified uterine fibroid is present.  No acute bony abnormality.  IMPRESSION: No acute abnormality in the chest or abdomen.   Original Report Authenticated By:  Camelia Phenes, M.D.     Lab Results: Basic Metabolic Panel: No results found for this basename: NA:2,K:2,CL:2,CO2:2,GLUCOSE:2,BUN:2,CREATININE:2,CALCIUM:2,MG:2,PHOS:2 in the last 72 hours Liver Function Tests: No results found for this basename: AST:2,ALT:2,ALKPHOS:2,BILITOT:2,PROT:2,ALBUMIN:2 in the last 72 hours   CBC: No results found for this basename: WBC:2,NEUTROABS:2,HGB:2,HCT:2,MCV:2,PLT:2 in the last 72 hours  Recent Results (from the past 240 hour(s))  URINE  CULTURE     Status: Normal   Collection Time   07/08/12  9:55 PM      Component Value Range Status Comment   Specimen Description URINE, CLEAN CATCH   Final    Special Requests NONE   Final    Culture  Setup Time 07/09/2012 23:02   Final    Colony Count 50,000 COLONIES/ML   Final    Culture     Final    Value: Multiple bacterial morphotypes present, none predominant. Suggest appropriate recollection if clinically indicated.   Report Status 07/10/2012 FINAL   Final      Hospital Course: She came to the emergency room with abdominal discomfort. She had a recent hospitalization for a similar problem and was found to have esophagitis and gastritis. She was hyponatremic on admission. She improved markedly with IV fluids and the addition of Reglan. By the time of discharge he was able to eat regular food.  Discharge Exam: Blood pressure 144/88, pulse 91, temperature 98.4 F (36.9 C), temperature source Oral, resp. rate 20, height 5\' 5"  (1.651 m), weight 97.7 kg (215 lb 6.2 oz), SpO2 93.00%. She was awake and alert. Her chest is clear. Her heart was regular. Her abdomen was soft with normal bowel sounds Disposition: Home      Discharge Orders    Future Orders Please Complete By Expires   Discharge patient         Follow-up Information    Follow up with Aysel Gilchrest L, MD. On 07/20/2012. (At 9:30am)    Contact information:   [address] [place] [address] [phone]          Signed: Fredirick Maudlin Pager [phone]  07/12/2012, 8:10 AM

## 2012-07-13 ENCOUNTER — Telehealth: Payer: Self-pay

## 2012-07-13 NOTE — Telephone Encounter (Signed)
LMOM to call.

## 2012-08-01 ENCOUNTER — Other Ambulatory Visit: Payer: Self-pay

## 2012-08-01 ENCOUNTER — Telehealth: Payer: Self-pay

## 2012-08-01 DIAGNOSIS — Z139 Encounter for screening, unspecified: Secondary | ICD-10-CM

## 2012-08-01 NOTE — Telephone Encounter (Signed)
See separate triage phone note.  

## 2012-08-02 MED ORDER — PEG-KCL-NACL-NASULF-NA ASC-C 100 G PO SOLR: 1.0000 | ORAL | Status: DC

## 2012-08-02 NOTE — Telephone Encounter (Signed)
Gastroenterology Pre-Procedure Form    Request Date: 08/01/2012    Requesting Physician: Dr. Juanetta Gosling     PATIENT INFORMATION:  [name] is a 61 y.o., female (DOB=1951-06-17).  PROCEDURE: Procedure(s) requested: colonoscopy Procedure Reason: screening for colon cancer  PATIENT REVIEW QUESTIONS: The patient reports the following:   1. Diabetes Melitis: yes  2. Joint replacements in the past 12 months: no 3. Major health problems in the past 3 months: no 4. Has an artificial valve or MVP:no 5. Has been advised in past to take antibiotics in advance of a procedure like teeth cleaning: no}    MEDICATIONS & ALLERGIES:    Patient reports the following regarding taking any blood thinners:   Plavix? no Aspirin?no Coumadin?  no  Patient confirms/reports the following medications:  Current Outpatient Prescriptions  Medication Sig Dispense Refill  . acetaminophen (TYLENOL) 500 MG tablet Take 1,000 mg by mouth every 6 (six) hours as needed. Pain.      . docusate sodium (STOOL SOFTENER) 100 MG capsule Take 100 mg by mouth as needed. For relief      . insulin glargine (LANTUS) 100 UNIT/ML injection Inject 35 Units into the skin 2 (two) times daily.      . insulin lispro (HUMALOG) 100 UNIT/ML injection Inject 5 Units into the skin 3 (three) times daily before meals.      Marland Kitchen losartan (COZAAR) 50 MG tablet Take 50 mg by mouth daily.      . metoCLOPramide (REGLAN) 5 MG tablet Take 1 tablet (5 mg total) by mouth 4 (four) times daily.  120 tablet  5  . pantoprazole (PROTONIX) 40 MG tablet Take 1 tablet (40 mg total) by mouth daily.  30 tablet  3    Patient confirms/reports the following allergies:  No Known Allergies  Patient is appropriate to schedule for requested procedure(s): yes  AUTHORIZATION INFORMATION Primary Insurance:   ID #:  Group #:  Pre-Cert / Auth required: Pre-Cert / Auth #:   Secondary Insurance: ID #:   Group #:  Pre-Cert / Auth required:  Pre-Cert / Auth #:    No orders of the defined types were placed in this encounter.    SCHEDULE INFORMATION: Procedure has been scheduled as follows:  Date: 08/29/2012     Time: 8:30 AM  Location: [facility] Short Stay  This Gastroenterology Pre-Precedure Form is being routed to the following provider(s) for review: R. Roetta Sessions, MD

## 2012-08-02 NOTE — Telephone Encounter (Signed)
OK for colonoscopy. Take half of your LANTUS INSULIN (17 units) the day prior to the procedure Hold evening slide scale insulin day of prep On your early morning appointment-hold all insulin/diabetes medications day of procedure Bring medications and/or any insulin to the hospital the day of the procedure. Follow blood sugars, call us or your PCP if any problems.

## 2012-08-02 NOTE — Telephone Encounter (Signed)
Rx sent to the pharmacy and instructions mailed to pt.  

## 2012-08-14 ENCOUNTER — Emergency Department (HOSPITAL_COMMUNITY): Payer: Medicaid Other

## 2012-08-14 ENCOUNTER — Emergency Department (HOSPITAL_COMMUNITY)
Admission: EM | Admit: 2012-08-14 | Discharge: 2012-08-15 | Disposition: A | Discharge status: Home / Self Care | Payer: Medicaid Other | Attending: Emergency Medicine | Admitting: Emergency Medicine

## 2012-08-14 ENCOUNTER — Encounter (HOSPITAL_COMMUNITY): Payer: Self-pay | Admitting: Emergency Medicine

## 2012-08-14 DIAGNOSIS — E871 Hypo-osmolality and hyponatremia: Secondary | ICD-10-CM | POA: Insufficient documentation

## 2012-08-14 DIAGNOSIS — R109 Unspecified abdominal pain: Secondary | ICD-10-CM | POA: Insufficient documentation

## 2012-08-14 DIAGNOSIS — I10 Essential (primary) hypertension: Secondary | ICD-10-CM | POA: Insufficient documentation

## 2012-08-14 DIAGNOSIS — E1169 Type 2 diabetes mellitus with other specified complication: Secondary | ICD-10-CM | POA: Insufficient documentation

## 2012-08-14 DIAGNOSIS — Z794 Long term (current) use of insulin: Secondary | ICD-10-CM | POA: Insufficient documentation

## 2012-08-14 DIAGNOSIS — K3184 Gastroparesis: Secondary | ICD-10-CM | POA: Insufficient documentation

## 2012-08-14 DIAGNOSIS — Z87891 Personal history of nicotine dependence: Secondary | ICD-10-CM | POA: Insufficient documentation

## 2012-08-14 DIAGNOSIS — Z79899 Other long term (current) drug therapy: Secondary | ICD-10-CM | POA: Insufficient documentation

## 2012-08-14 DIAGNOSIS — Z8719 Personal history of other diseases of the digestive system: Secondary | ICD-10-CM | POA: Insufficient documentation

## 2012-08-14 DIAGNOSIS — Z9889 Other specified postprocedural states: Secondary | ICD-10-CM | POA: Insufficient documentation

## 2012-08-14 DIAGNOSIS — E1149 Type 2 diabetes mellitus with other diabetic neurological complication: Secondary | ICD-10-CM | POA: Insufficient documentation

## 2012-08-14 DIAGNOSIS — R739 Hyperglycemia, unspecified: Secondary | ICD-10-CM

## 2012-08-14 DIAGNOSIS — Z862 Personal history of diseases of the blood and blood-forming organs and certain disorders involving the immune mechanism: Secondary | ICD-10-CM | POA: Insufficient documentation

## 2012-08-14 DIAGNOSIS — Z8739 Personal history of other diseases of the musculoskeletal system and connective tissue: Secondary | ICD-10-CM | POA: Insufficient documentation

## 2012-08-14 DIAGNOSIS — R1110 Vomiting, unspecified: Secondary | ICD-10-CM | POA: Insufficient documentation

## 2012-08-14 LAB — URINALYSIS, ROUTINE W REFLEX MICROSCOPIC
Bilirubin Urine: NEGATIVE
Hgb urine dipstick: NEGATIVE
Protein, ur: NEGATIVE mg/dL
Urobilinogen, UA: 0.2 mg/dL (ref 0.0–1.0)

## 2012-08-14 LAB — COMPREHENSIVE METABOLIC PANEL
ALT: 20 U/L (ref 0–35)
Alkaline Phosphatase: 95 U/L (ref 39–117)
CO2: 23 mEq/L (ref 19–32)
Chloride: 89 mEq/L — ABNORMAL LOW (ref 96–112)
GFR calc Af Amer: >90 mL/min (ref >90)
GFR calc non Af Amer: >90 mL/min (ref >90)
Glucose, Bld: 356 mg/dL — ABNORMAL HIGH (ref 70–99)
Potassium: 3.7 mEq/L (ref 3.5–5.1)
Sodium: 124 mEq/L — ABNORMAL LOW (ref 135–145)
Total Bilirubin: 0.5 mg/dL (ref 0.3–1.2)

## 2012-08-14 LAB — CBC WITH DIFFERENTIAL/PLATELET
Basophils Relative: 0 % (ref 0–1)
Eosinophils Absolute: 0 10*3/uL (ref 0.0–0.7)
HCT: 39.5 % (ref 36.0–46.0)
Hemoglobin: 13.5 g/dL (ref 12.0–15.0)
Lymphocytes Relative: 16 % (ref 12–46)
MCHC: 34.2 g/dL (ref 30.0–36.0)
Monocytes Relative: 9 % (ref 3–12)
Neutro Abs: 7.3 10*3/uL (ref 1.7–7.7)

## 2012-08-14 LAB — GLUCOSE, CAPILLARY: Glucose-Capillary: 337 mg/dL — ABNORMAL HIGH (ref 70–99)

## 2012-08-14 MED ORDER — METOCLOPRAMIDE HCL 5 MG/ML IJ SOLN
10.0000 mg | Freq: Once | INTRAMUSCULAR | Status: AC
  Administered 2012-08-14: 10 mg via INTRAVENOUS
  Filled 2012-08-14: qty 2

## 2012-08-14 MED ORDER — FAMOTIDINE IN NACL 20-0.9 MG/50ML-% IV SOLN
20.0000 mg | Freq: Once | INTRAVENOUS | Status: AC
  Administered 2012-08-14: 20 mg via INTRAVENOUS
  Filled 2012-08-14: qty 50

## 2012-08-14 MED ORDER — SODIUM CHLORIDE 0.9 % IV SOLN
INTRAVENOUS | Status: DC
  Administered 2012-08-15: via INTRAVENOUS

## 2012-08-14 MED ORDER — PANTOPRAZOLE SODIUM 40 MG IV SOLR
40.0000 mg | Freq: Once | INTRAVENOUS | Status: AC
  Administered 2012-08-14: 40 mg via INTRAVENOUS
  Filled 2012-08-14: qty 40

## 2012-08-14 MED ORDER — ONDANSETRON HCL 4 MG/2ML IJ SOLN
4.0000 mg | Freq: Once | INTRAMUSCULAR | Status: AC
  Administered 2012-08-14: 4 mg via INTRAVENOUS
  Filled 2012-08-14: qty 2

## 2012-08-14 MED ORDER — SODIUM CHLORIDE 0.9 % IV BOLUS (SEPSIS)
2000.0000 mL | Freq: Once | INTRAVENOUS | Status: AC
  Administered 2012-08-14: 2000 mL via INTRAVENOUS

## 2012-08-14 MED ORDER — HYDROMORPHONE HCL PF 1 MG/ML IJ SOLN
1.0000 mg | Freq: Once | INTRAMUSCULAR | Status: AC
  Administered 2012-08-14: 1 mg via INTRAVENOUS
  Filled 2012-08-14: qty 1

## 2012-08-14 NOTE — ED Notes (Signed)
Pt c/o abd pain with n/v since Saturday.  

## 2012-08-14 NOTE — ED Provider Notes (Signed)
History  This chart was scribed for Hurman Horn, MD by Donne Anon, ED Scribe. This patient was seen in room [facility] and the patient's care was started at 8:24 PM.    CSN: [id number]  Arrival date & time 08/14/12  2024   First MD Initiated Contact with Patient 08/14/12 2030      Chief Complaint  Patient presents with  . Emesis     The history is provided by the patient. No language interpreter was used.   [name] is a 61 y.o. female who presents to the Emergency Department complaining of nausea, vomiting and abdominal pain that began on 3 days ago. She was seen one month ago for the same issue and was prescibed Reglan which she has been taking. It has helped to alleviate symptoms, with the exception of the past few days. She was also hospitalized a month ago and diagnosed with DM related gastroparesis. She has constant, waxing and waning pain in the upper middle portion of her abdomen, that she states is similar to last months abdominal pain. She had 1 episode of diarrhea 2 days ago that was not bloody. She has not had a bowel movement or flatulence since then, along with decreased urine output since then. She has vomited several times since Saturday, described with a yellow color. She denies any melena, hematochezia, hematemesis, dysuria, increased urinary frequency, fever, rash, chest pain, or SOB. She is supposed to be on insulin injections but has not been taking them due to decreased food and fluid intake. She has a history of DM.   Past Medical History  Diagnosis Date  . Diabetes mellitus   . Hypertension   . Arthritis   . TTP (thrombotic thrombocytopenic purpura)   . Erosive esophagitis   . Gastritis     Past Surgical History  Procedure Date  . Splenectomy, partial   . Ectopic pregnancy surgery   . Esophagogastroduodenoscopy 04/20/2012    Procedure: ESOPHAGOGASTRODUODENOSCOPY (EGD);  Surgeon: Malissa Hippo, MD;  Location[facility];  Service: Endoscopy;   Laterality: N/A;    Family History  Problem Relation Age of Onset  . Heart failure Mother   . Cancer Mother     History  Substance Use Topics  . Smoking status: Former Smoker    Types: Cigarettes  . Smokeless tobacco: Not on file  . Alcohol Use: No    OB History    Grav Para Term Preterm Abortions TAB SAB Ect Mult Living                  Review of Systems 10 Systems reviewed and all are negative for acute change except as noted in the HPI.   Allergies  Review of patient's allergies indicates no known allergies.  Home Medications   Current Outpatient Rx  Name  Route  Sig  Dispense  Refill  . ACETAMINOPHEN 500 MG PO TABS   Oral   Take 1,000 mg by mouth every 6 (six) hours as needed. Pain.         Marland Kitchen DOCUSATE SODIUM 100 MG PO CAPS   Oral   Take 100 mg by mouth daily as needed. For relief         . INSULIN GLARGINE 100 UNIT/ML [place] SOLN   Subcutaneous   Inject 35 Units into the skin 2 (two) times daily.         . INSULIN LISPRO (HUMAN) 100 UNIT/ML  SOLN   Subcutaneous   Inject 5  Units into the skin 3 (three) times daily before meals.         Marland Kitchen LOSARTAN POTASSIUM 50 MG PO TABS   Oral   Take 50 mg by mouth daily.         Marland Kitchen METOCLOPRAMIDE HCL 5 MG PO TABS   Oral   Take 1 tablet (5 mg total) by mouth 4 (four) times daily.   120 tablet   5   . PANTOPRAZOLE SODIUM 40 MG PO TBEC   Oral   Take 1 tablet (40 mg total) by mouth daily.   30 tablet   3   . PEG-KCL-NACL-NASULF-NA ASC-C 100 G PO SOLR   Oral   Take 1 kit (100 g total) by mouth as directed.   1 kit   0     Triage Vitals: BP 169/92  Pulse 101  Temp 98.5 F (36.9 C) (Oral)  Resp 20  Ht 5' 5.5" (1.664 m)  Wt 210 lb (95.255 kg)  BMI 34.41 kg/m2  SpO2 97%  Physical Exam  Nursing note and vitals reviewed. Constitutional:       Awake, alert, nontoxic appearance with baseline speech for patient.  HENT:  Head: Atraumatic.  Mouth/Throat: No oropharyngeal exudate.       Mucous  membranes are dry  Eyes: EOM are normal. Pupils are equal, round, and reactive to light. Right eye exhibits no discharge. Left eye exhibits no discharge.  Neck: Neck supple.  Cardiovascular: Regular rhythm and normal heart sounds.  Tachycardia present.   No murmur heard.      Mild tachycardia.   Pulmonary/Chest: Effort normal and breath sounds normal. No stridor. No respiratory distress. She has no wheezes. She has no rales. She exhibits no tenderness.  Abdominal: Soft. Bowel sounds are normal. She exhibits no mass. There is tenderness. There is no rebound.       Mild to moderate tenderness diffusely without radiation.  Musculoskeletal: She exhibits no edema and no tenderness.       Baseline ROM, moves extremities with no obvious new focal weakness. No CVA tenderness.  Lymphadenopathy:    She has no cervical adenopathy.  Neurological:       Awake, alert, cooperative and aware of situation; motor strength bilaterally; sensation normal to light touch bilaterally; peripheral visual fields full to confrontation; no facial asymmetry; tongue midline; major cranial nerves appear intact; no pronator drift, normal finger to nose bilaterally, baseline gait without new ataxia.  Skin: No rash noted.  Psychiatric: She has a normal mood and affect.    ED Course  Procedures (including critical care time) ECG: Normal sinus rhythm, ventricular rate 95, normal axis, left ventricular hypertrophy, no acute ischemic changes noted, no significant change noted compared with 07/08/2012 DIAGNOSTIC STUDIES: Oxygen Saturation is 97% on room air, normal by my interpretation.    COORDINATION OF CARE: 8:44 PM: Patient / Family / Caregiver understand and agree with initial ED impression and plan with expectations set for ED visit.  9:00 PM: Ordered 1 mg injection of Dilaudid, 4 mg injection of Zofran, 20 mg of Pepcid, 40 mg of Protonix, of IV fluids, 10 mg of Reglan  10:34 PM: Pt feels improved after  observation and/or treatment in ED. Will repeat BMP and Troponin after she completes her 2000 mL of IV fluids. She has only completed 500 mL currently.    Results for orders placed during the hospital encounter of 08/14/12  GLUCOSE, CAPILLARY      Component Value Range  Glucose-Capillary 337 (*) 70 - 99 mg/dL  CBC WITH DIFFERENTIAL      Component Value Range   WBC 9.8  4.0 - 10.5 K/uL   RBC 5.01  3.87 - 5.11 MIL/uL   Hemoglobin 13.5  12.0 - 15.0 g/dL   HCT 19.1  47.8 - 29.5 %   MCV 78.8  78.0 - 100.0 fL   MCH 26.9  26.0 - 34.0 pg   MCHC 34.2  30.0 - 36.0 g/dL   RDW 62.1  30.8 - 65.7 %   Platelets 236  150 - 400 K/uL   Neutrophils Relative 75  43 - 77 %   Lymphocytes Relative 16  12 - 46 %   Monocytes Relative 9  3 - 12 %   Eosinophils Relative 0  0 - 5 %   Basophils Relative 0  0 - 1 %   Neutro Abs 7.3  1.7 - 7.7 K/uL   Lymphs Abs 1.6  0.7 - 4.0 K/uL   Monocytes Absolute 0.9  0.1 - 1.0 K/uL   Eosinophils Absolute 0.0  0.0 - 0.7 K/uL   Basophils Absolute 0.0  0.0 - 0.1 K/uL   RBC Morphology TARGET CELLS     Smear Review PLATELET COUNT CONFIRMED BY SMEAR    COMPREHENSIVE METABOLIC PANEL      Component Value Range   Sodium 124 (*) 135 - 145 mEq/L   Potassium 3.7  3.5 - 5.1 mEq/L   Chloride 89 (*) 96 - 112 mEq/L   CO2 23  19 - 32 mEq/L   Glucose, Bld 356 (*) 70 - 99 mg/dL   BUN 12  6 - 23 mg/dL   Creatinine, Ser 8.46  0.50 - 1.10 mg/dL   Calcium 9.2  8.4 - 96.2 mg/dL   Total Protein 8.0  6.0 - 8.3 g/dL   Albumin 3.5  3.5 - 5.2 g/dL   AST 12  0 - 37 U/L   ALT 20  0 - 35 U/L   Alkaline Phosphatase 95  39 - 117 U/L   Total Bilirubin 0.5  0.3 - 1.2 mg/dL   GFR calc non Af Amer >90  >90 mL/min   GFR calc Af Amer >90  >90 mL/min  LIPASE, BLOOD      Component Value Range   Lipase 16  11 - 59 U/L  URINALYSIS, ROUTINE W REFLEX MICROSCOPIC      Component Value Range   Color, Urine YELLOW  YELLOW   APPearance CLEAR  CLEAR   Specific Gravity, Urine 1.010  1.005 - 1.030   pH  6.0  5.0 - 8.0   Glucose, UA 500 (*) NEGATIVE mg/dL   Hgb urine dipstick NEGATIVE  NEGATIVE   Bilirubin Urine NEGATIVE  NEGATIVE   Ketones, ur 15 (*) NEGATIVE mg/dL   Protein, ur NEGATIVE  NEGATIVE mg/dL   Urobilinogen, UA 0.2  0.0 - 1.0 mg/dL   Nitrite NEGATIVE  NEGATIVE   Leukocytes, UA NEGATIVE  NEGATIVE  TROPONIN I      Component Value Range   Troponin I <0.30  <0.30 ng/mL   Dg Abd Acute W/chest  08/14/2012  *RADIOLOGY REPORT*  Clinical Data: Abdominal pain, vomiting and fever.  Previous splenectomy.  ACUTE ABDOMEN SERIES (ABDOMEN 2 VIEW & CHEST 1 VIEW)  Comparison: Previous examinations.  Findings: Normal sized heart.  Clear lungs.  Normal bowel gas pattern without free peritoneal air.  Surgical clips in the left upper abdomen.  Thoracic and lumbar spine degenerative changes and  mild dextroconvex scoliosis. 4.4 cm calcified uterine fibroid in the left pelvis.  IMPRESSION: No acute abnormality.   Original Report Authenticated By: Beckie Salts, M.D.      1. Vomiting   2. Abdominal pain   3. Diabetic gastroparesis   4. Hyperglycemia   5. Hyponatremia       MDM  Doubt SBO, ACS, DKA.  I personally performed the services described in this documentation, which was scribed in my presence. The recorded information has been reviewed and is accurate.  Dispo pnd: anticipate discharge by Dr. Dierdre Highman after repeat labs.         Hurman Horn, MD 08/14/12 [phone]

## 2012-08-14 NOTE — ED Notes (Signed)
Family at bedside, contact number provided [phone] ([place]) if the patient needs a way home after treatment.

## 2012-08-15 LAB — BASIC METABOLIC PANEL
CO2: 23 mEq/L (ref 19–32)
Calcium: 8.5 mg/dL (ref 8.4–10.5)
Chloride: 93 mEq/L — ABNORMAL LOW (ref 96–112)
Creatinine, Ser: 0.57 mg/dL (ref 0.50–1.10)
Glucose, Bld: 313 mg/dL — ABNORMAL HIGH (ref 70–99)
Sodium: 126 mEq/L — ABNORMAL LOW (ref 135–145)

## 2012-08-29 ENCOUNTER — Encounter (HOSPITAL_COMMUNITY): Payer: Self-pay | Admitting: *Deleted

## 2012-08-29 ENCOUNTER — Ambulatory Visit (HOSPITAL_COMMUNITY)
Admission: RE | Admit: 2012-08-29 | Discharge: 2012-08-29 | Disposition: A | Discharge status: Home / Self Care | Payer: Medicaid Other | Source: Ambulatory Visit | Attending: Internal Medicine | Admitting: Internal Medicine

## 2012-08-29 ENCOUNTER — Encounter (HOSPITAL_COMMUNITY)
Admission: RE | Disposition: A | Discharge status: Home / Self Care | Payer: Self-pay | Source: Ambulatory Visit | Attending: Internal Medicine

## 2012-08-29 DIAGNOSIS — Z139 Encounter for screening, unspecified: Secondary | ICD-10-CM

## 2012-08-29 DIAGNOSIS — Z1211 Encounter for screening for malignant neoplasm of colon: Secondary | ICD-10-CM

## 2012-08-29 DIAGNOSIS — I10 Essential (primary) hypertension: Secondary | ICD-10-CM | POA: Insufficient documentation

## 2012-08-29 DIAGNOSIS — K5730 Diverticulosis of large intestine without perforation or abscess without bleeding: Secondary | ICD-10-CM

## 2012-08-29 DIAGNOSIS — E119 Type 2 diabetes mellitus without complications: Secondary | ICD-10-CM | POA: Insufficient documentation

## 2012-08-29 HISTORY — PX: COLONOSCOPY: SHX5424

## 2012-08-29 LAB — GLUCOSE, CAPILLARY

## 2012-08-29 SURGERY — COLONOSCOPY
Anesthesia: Moderate Sedation

## 2012-08-29 MED ORDER — SODIUM CHLORIDE 0.45 % IV SOLN
INTRAVENOUS | Status: DC
  Administered 2012-08-29: 08:00:00 via INTRAVENOUS

## 2012-08-29 MED ORDER — MIDAZOLAM HCL 5 MG/5ML IJ SOLN
INTRAMUSCULAR | Status: DC | PRN
  Administered 2012-08-29: 1 mg via INTRAVENOUS
  Administered 2012-08-29: 2 mg via INTRAVENOUS
  Administered 2012-08-29: 1 mg via INTRAVENOUS

## 2012-08-29 MED ORDER — MEPERIDINE HCL 100 MG/ML IJ SOLN
INTRAMUSCULAR | Status: DC | PRN
  Administered 2012-08-29: 25 mg via INTRAVENOUS
  Administered 2012-08-29: 50 mg via INTRAVENOUS

## 2012-08-29 MED ORDER — MIDAZOLAM HCL 5 MG/5ML IJ SOLN
INTRAMUSCULAR | Status: AC
  Filled 2012-08-29: qty 10

## 2012-08-29 MED ORDER — STERILE WATER FOR IRRIGATION IR SOLN
Status: DC | PRN
  Administered 2012-08-29: 09:00:00

## 2012-08-29 MED ORDER — MEPERIDINE HCL 100 MG/ML IJ SOLN
INTRAMUSCULAR | Status: AC
  Filled 2012-08-29: qty 1

## 2012-08-29 NOTE — Op Note (Signed)
[facility] [address] [place], [address]   COLONOSCOPY PROCEDURE REPORT  PATIENT: [name], [name]  MR#:         [id number] BIRTHDATE: 1950/12/26 , 61  yrs. old GENDER: Female ENDOSCOPIST: R.  Roetta Sessions, MD FACP [facility] REFERRED BY:  Kari Baars, M.D. PROCEDURE DATE:  08/29/2012 PROCEDURE:     Screening colonoscopy  INDICATIONS: First ever average risk screening examination  INFORMED CONSENT:  The risks, benefits, alternatives and imponderables including but not limited to bleeding, perforation as well as the possibility of a missed lesion have been reviewed.  The potential for biopsy, lesion removal, etc. have also been discussed.  Questions have been answered.  All parties agreeable. Please see the history and physical in the medical record for more information.  MEDICATIONS: Versed 4 mg IV and Demerol 75 mg IV in divided doses.  DESCRIPTION OF PROCEDURE:  After a digital rectal exam was performed, the Pentax Colonoscope [id number]  colonoscope was advanced from the anus through the rectum and colon to the area of the cecum, ileocecal valve and appendiceal orifice.  The cecum was deeply intubated.  These structures were well-seen and photographed for the record.  From the level of the cecum and ileocecal valve, the scope was slowly and cautiously withdrawn.  The mucosal surfaces were carefully surveyed utilizing scope tip deflection to facilitate fold flattening as needed.  The scope was pulled down into the rectum where a thorough examination including retroflexion was performed.    FINDINGS:  adequate preparation. Normal rectum. Scattered left-sided diverticula; the remainder of the colonic mucosa appeared normal.  THERAPEUTIC / DIAGNOSTIC MANEUVERS PERFORMED:  None  COMPLICATIONS: None  CECAL WITHDRAWAL TIME:  15 minute  IMPRESSION:  Colonic diverticulosis  RECOMMENDATIONS: Repeat screening colonoscopy in 10  years   _______________________________ eSigned:  R. Roetta Sessions, MD FACP [facility] 08/29/2012 9:24 AM   CC:

## 2012-08-29 NOTE — H&P (Signed)
Primary Care Physician:  Fredirick Maudlin, MD Primary Gastroenterologist:  Dr. Jena Gauss  Pre-Procedure History & Physical: HPI:  [name] is a 61 y.o. female is here for a screening colonoscopy. First ever colonoscopy. No bowel symptoms. No family history of colon cancer or colon polyps.  Past Medical History  Diagnosis Date  . Diabetes mellitus   . Hypertension   . Arthritis   . TTP (thrombotic thrombocytopenic purpura)   . Erosive esophagitis   . Gastritis     Past Surgical History  Procedure Date  . Splenectomy, partial   . Ectopic pregnancy surgery   . Esophagogastroduodenoscopy 04/20/2012    Procedure: ESOPHAGOGASTRODUODENOSCOPY (EGD);  Surgeon: Malissa Hippo, MD;  Location: [facility];  Service: Endoscopy;  Laterality: N/A;    Prior to Admission medications   Medication Sig Start Date End Date Taking? Authorizing Provider  acetaminophen (TYLENOL) 500 MG tablet Take 1,000 mg by mouth every 6 (six) hours as needed. Pain.   Yes Historical Provider, MD  docusate sodium (STOOL SOFTENER) 100 MG capsule Take 100 mg by mouth daily as needed. For relief   Yes Historical Provider, MD  insulin glargine (LANTUS) 100 UNIT/ML injection Inject 35 Units into the skin 2 (two) times daily.   Yes Historical Provider, MD  insulin lispro (HUMALOG) 100 UNIT/ML injection Inject 5 Units into the skin 3 (three) times daily before meals.   Yes Historical Provider, MD  losartan (COZAAR) 50 MG tablet Take 50 mg by mouth daily.   Yes Historical Provider, MD  metoCLOPramide (REGLAN) 5 MG tablet Take 1 tablet (5 mg total) by mouth 4 (four) times daily. 07/10/12  Yes Fredirick Maudlin, MD  multivitamin-iron-minerals-folic acid (CENTRUM) chewable tablet Chew 1 tablet by mouth daily.   Yes Historical Provider, MD  pantoprazole (PROTONIX) 40 MG tablet Take 1 tablet (40 mg total) by mouth daily. 04/21/12 04/21/13 Yes Avon Gully, MD  peg 3350 powder (MOVIPREP) 100 G SOLR Take 1 kit (100 g total) by mouth  as directed. 08/02/12  Yes Corbin Ade, MD    Allergies as of 08/01/2012  . (No Known Allergies)    Family History  Problem Relation Age of Onset  . Heart failure Mother   . Cancer Mother   . Colon cancer Neg Hx     History   Social History  . Marital Status: Divorced    Spouse Name: N/A    Number of Children: N/A  . Years of Education: N/A   Occupational History  . Not on file.   Social History Main Topics  . Smoking status: Former Smoker -- 0.2 packs/day for 10 years    Types: Cigarettes  . Smokeless tobacco: Not on file  . Alcohol Use: No  . Drug Use: No  . Sexually Active:    Other Topics Concern  . Not on file   Social History Narrative  . No narrative on file    Review of Systems: See HPI, otherwise negative ROS  Physical Exam: BP 137/92  Pulse 102  Temp 98.5 F (36.9 C) (Oral)  Resp 14  Ht 5' 5.5" (1.664 m)  Wt 210 lb (95.255 kg)  BMI 34.41 kg/m2  SpO2 95% General:   Alert,  Well-developed, well-nourished, pleasant and cooperative in NAD Head:  Normocephalic and atraumatic. Eyes:  Sclera clear, no icterus.   Conjunctiva pink. Ears:  Normal auditory acuity. Nose:  No deformity, discharge,  or lesions. Mouth:  No deformity or lesions, dentition normal. Neck:  Supple;  no masses or thyromegaly. Lungs:  Clear throughout to auscultation.   No wheezes, crackles, or rhonchi. No acute distress. Heart:  Regular rate and rhythm; no murmurs, clicks, rubs,  or gallops. Abdomen:  Soft, nontender and nondistended. No masses, hepatosplenomegaly or hernias noted. Normal bowel sounds, without guarding, and without rebound.   Msk:  Symmetrical without gross deformities. Normal posture. Pulses:  Normal pulses noted. Extremities:  Without clubbing or edema. Neurologic:  Alert and  oriented x4;  grossly normal neurologically. Skin:  Intact without significant lesions or rashes. Cervical Nodes:  No significant cervical adenopathy. Psych:  Alert and cooperative.  Normal mood and affect.  Impression/Plan: [name] is now here to undergo a screening colonoscopy.  First-ever average risk screening examination.  Risks, benefits, limitations, imponderables and alternatives regarding colonoscopy have been reviewed with the patient. Questions have been answered. All parties agreeable.

## 2012-08-30 ENCOUNTER — Encounter (HOSPITAL_COMMUNITY): Payer: Self-pay | Admitting: Internal Medicine

## 2012-09-19 NOTE — Progress Notes (Signed)
UR Chart Review Completed  

## 2012-11-10 ENCOUNTER — Other Ambulatory Visit (HOSPITAL_COMMUNITY): Payer: Self-pay | Admitting: Pulmonary Disease

## 2012-11-10 DIAGNOSIS — R112 Nausea with vomiting, unspecified: Secondary | ICD-10-CM

## 2012-11-14 ENCOUNTER — Ambulatory Visit (HOSPITAL_COMMUNITY)
Admission: RE | Admit: 2012-11-14 | Discharge: 2012-11-14 | Disposition: A | Discharge status: Home / Self Care | Payer: Medicaid Other | Source: Ambulatory Visit | Attending: Pulmonary Disease | Admitting: Pulmonary Disease

## 2012-11-14 DIAGNOSIS — R112 Nausea with vomiting, unspecified: Secondary | ICD-10-CM | POA: Insufficient documentation

## 2012-11-14 DIAGNOSIS — R935 Abnormal findings on diagnostic imaging of other abdominal regions, including retroperitoneum: Secondary | ICD-10-CM | POA: Insufficient documentation

## 2012-11-29 ENCOUNTER — Encounter: Payer: Self-pay | Admitting: Gastroenterology

## 2012-11-30 ENCOUNTER — Encounter: Payer: Self-pay | Admitting: Urgent Care

## 2012-11-30 ENCOUNTER — Ambulatory Visit (INDEPENDENT_AMBULATORY_CARE_PROVIDER_SITE_OTHER): Payer: Medicaid Other | Admitting: Urgent Care

## 2012-11-30 ENCOUNTER — Telehealth: Payer: Self-pay

## 2012-11-30 VITALS — BP 154/97 | HR 102 | Temp 97.3°F | Ht 65.0 in | Wt 213.2 lb

## 2012-11-30 DIAGNOSIS — K5909 Other constipation: Secondary | ICD-10-CM

## 2012-11-30 DIAGNOSIS — K5900 Constipation, unspecified: Secondary | ICD-10-CM

## 2012-11-30 DIAGNOSIS — R112 Nausea with vomiting, unspecified: Secondary | ICD-10-CM

## 2012-11-30 DIAGNOSIS — K760 Fatty (change of) liver, not elsewhere classified: Secondary | ICD-10-CM | POA: Insufficient documentation

## 2012-11-30 DIAGNOSIS — R109 Unspecified abdominal pain: Secondary | ICD-10-CM

## 2012-11-30 DIAGNOSIS — K219 Gastro-esophageal reflux disease without esophagitis: Secondary | ICD-10-CM

## 2012-11-30 DIAGNOSIS — R1030 Lower abdominal pain, unspecified: Secondary | ICD-10-CM | POA: Insufficient documentation

## 2012-11-30 DIAGNOSIS — K7689 Other specified diseases of liver: Secondary | ICD-10-CM

## 2012-11-30 MED ORDER — LUBIPROSTONE 24 MCG PO CAPS: 24.0000 ug | ORAL_CAPSULE | Freq: Two times a day (BID) | ORAL | Status: DC

## 2012-11-30 MED ORDER — LINACLOTIDE 145 MCG PO CAPS: 145.0000 ug | ORAL_CAPSULE | Freq: Every day | ORAL | Status: DC

## 2012-11-30 NOTE — Progress Notes (Signed)
Primary Care Physician:  Fredirick Maudlin, MD Primary Gastroenterologist:  Dr. Jena Gauss  Chief Complaint  Patient presents with  . Nausea  . Emesis    HPI:  [name] is a 62 y.o. female here as a new patient for evaluation of nausea & vomiting.  She tells me she has been 4 times to [facility] for similar symptoms.  She was seen by Dr. Karilyn Cota July 2013 for nausea and vomiting. She underwent EGD by him at that time.  EGD showed erosive reflux esophagitis/gastritis. She was started on Reglan 5mg  QID about 3months ago.  No side effects.  Helps until episodes of N/V.  Episode starts with waking up with N/V & then she goes back to bed.  Vomits for at least 1-3 days at a time.  Constant  q2-3 hrs.  Episodes have started any particular time of day.  Dark liquids, no foods in emesis.  Denies heartburn, indigestion, dysphagia or odynophagia.  On Protonix 40mg  daily.  Blood sugars high (200s) at times.  Pain Bilateral lower quads that is intermittent with nausea.  Pain 9/10 & last days.  Denies fever.  +hot flashes & chills & stays in the bed.  Last went to [facility] December 2013.  ABD [place] 11/14/12-Suspected hepatic steatosis.  Mildly nodular hepatic contour, correlate for cirrhosis.  No hx jaundice or hepatitis.  Denies etoh.  Chronic constipation.  Episodes come on with constipation.  Can go 5-6 days without BM.  On docusate at night.  Reports weight loss of 10# in past 2-3 months.    07/08/2012 CT A/P with IV/oral contrast-No evidence of bowel obstruction. Normal appendix. No CT findings to account for the patient's abdominal pain. Additional stable ancillary findings as above.    Past Medical History  Diagnosis Date  . Diabetes mellitus   . Hypertension   . Arthritis   . TTP (thrombotic thrombocytopenic purpura)   . Erosive esophagitis   . Gastritis   . Anemia   . Thyroid disease   . Duodenal ulcer     Age 31  . Obese   . Short-term memory loss     Past Surgical History  Procedure  Laterality Date  . Splenectomy, partial    . Ectopic pregnancy surgery    . Esophagogastroduodenoscopy  04/20/2012    [id number] antral gastritis/Erosive reflux esophagitis  . Colonoscopy  08/29/2012    [id number] diverticulosis    Current Outpatient Prescriptions  Medication Sig Dispense Refill  . acetaminophen (TYLENOL) 500 MG tablet Take 1,000 mg by mouth every 6 (six) hours as needed. Pain.      . docusate sodium (STOOL SOFTENER) 100 MG capsule Take 100 mg by mouth daily as needed. For relief      . insulin glargine (LANTUS) 100 UNIT/ML injection Inject 35 Units into the skin 2 (two) times daily.      . insulin lispro (HUMALOG) 100 UNIT/ML injection Inject 5 Units into the skin 3 (three) times daily before meals.      Marland Kitchen losartan (COZAAR) 50 MG tablet Take 50 mg by mouth daily.      . metoCLOPramide (REGLAN) 5 MG tablet Take 1 tablet (5 mg total) by mouth 4 (four) times daily.  120 tablet  5  . multivitamin-iron-minerals-folic acid (CENTRUM) chewable tablet Chew 1 tablet by mouth daily.      . pantoprazole (PROTONIX) 40 MG tablet Take 1 tablet (40 mg total) by mouth daily.  30 tablet  3  . Linaclotide (LINZESS) 145 MCG  CAPS Take 1 capsule (145 mcg total) by mouth daily.  30 capsule  5  . lubiprostone (AMITIZA) 24 MCG capsule Take 1 capsule (24 mcg total) by mouth 2 (two) times daily with a meal.  60 capsule  5   No current facility-administered medications for this visit.    Allergies as of 11/30/2012  . (No Known Allergies)    Family History  Problem Relation Age of Onset  . Heart failure Mother   . Cirrhosis Father     ETOH  . Colon cancer Neg Hx   . Diabetes Daughter     History   Social History  . Marital Status: Divorced    Spouse Name: N/A    Number of Children: 3  . Years of Education: N/A   Occupational History  . disabled    Social History Main Topics  . Smoking status: Former Smoker -- 0.25 packs/day for 10 years    Types: Cigarettes  . Smokeless  tobacco: Not on file  . Alcohol Use: No  . Drug Use: No  . Sexually Active: Not on file   Other Topics Concern  . Not on file   Social History Narrative   Lives w/ daughter, Apolinar Junes          Review of Systems: Gen: see HPI CV: Denies chest pain, angina, palpitations, syncope, orthopnea, PND, peripheral edema, and claudication. Resp: Denies dyspnea at rest, dyspnea with exercise, cough, sputum, wheezing, coughing up blood, and pleurisy. GI: Denies vomiting blood, jaundice, and fecal incontinence.    GU : Denies urinary burning, blood in urine, urinary frequency, urinary hesitancy, nocturnal urination, and urinary incontinence. MS: Denies joint pain, limitation of movement, and swelling, stiffness, low back pain, extremity pain. Denies muscle weakness, cramps, atrophy.  Derm: Denies rash, itching, dry skin, hives, moles, warts, or unhealing ulcers.  Psych: Denies depression, anxiety, memory loss, suicidal ideation, hallucinations, paranoia, and confusion. Heme: Denies bruising, bleeding, and enlarged lymph nodes. Neuro:  Denies any headaches, dizziness, paresthesias. Endo:  Denies any problems with DM, thyroid, adrenal function.  Physical Exam: BP 154/97  Pulse 102  Temp(Src) 97.3 F (36.3 C) (Oral)  Ht 5\' 5"  (1.651 m)  Wt 213 lb 3.2 oz (96.707 kg)  BMI 35.48 kg/m2 No LMP recorded. Patient is postmenopausal. General:   Alert,  Well-developed,obese, pleasant and cooperative in NAD Head:  Normocephalic and atraumatic. Eyes:  Sclera clear, no icterus.   Conjunctiva pink. Ears:  Normal auditory acuity. Nose:  No deformity, discharge, or lesions. Mouth:  No deformity or lesions,oropharynx pink & moist. Neck:  Supple; no masses or thyromegaly. Lungs:  Clear throughout to auscultation.   No wheezes, crackles, or rhonchi. No acute distress. Heart:  Regular rate and rhythm; no murmurs, clicks, rubs,  or gallops. Abdomen:  Protuberant.  Normal bowel sounds.  No bruits.  Soft,  non-tender and non-distended without masses, hepatosplenomegaly or hernias noted.  No guarding or rebound tenderness.  Exam limited given body habitus. Rectal:  Deferred. Msk:  Symmetrical without gross deformities. Normal posture. Pulses:  Normal pulses noted. Extremities:  No edema. Neurologic:  Alert and  oriented x4;  grossly normal neurologically. Skin:  Intact without significant lesions or rashes. Lymph Nodes:  No significant cervical adenopathy. Psych:  Alert and cooperative. Normal mood and affect.

## 2012-11-30 NOTE — Telephone Encounter (Signed)
Received fax from the pharmacy- medicaid is not covering the linzess yet. I dont not see where pt has tried [place], which is preferred. Do you want to switch to amitiza?

## 2012-11-30 NOTE — Telephone Encounter (Signed)
rx sent to the pharmacy. 

## 2012-11-30 NOTE — Patient Instructions (Addendum)
Continue Protonix 40 mg daily Please get your labs as soon as possible.  We will call you with results. Gastric emptying study. We will call you with the results Please hold your Reglan 2 days prior to your gastric imaging study Begin Linzess daily for constipation Instructions for fatty liver: Recommend 1-2# weight loss per week until ideal body weight through exercise & diet. Low fat/cholesterol diet.   Avoid sweets, sodas, fruit juices, sweetened beverages like tea, etc. Gradually increase exercise from 15 min daily up to 1 hr per day 5 days/week. Limit alcohol use.

## 2012-11-30 NOTE — Telephone Encounter (Signed)
Sure, Amitiza BID PRN constipation w/ food, #60, 5 RF. Thanks

## 2012-12-01 ENCOUNTER — Encounter: Payer: Self-pay | Admitting: Urgent Care

## 2012-12-01 NOTE — Assessment & Plan Note (Signed)
May be contributing to lower abdominal pain.    Begin Linzess daily for constipation

## 2012-12-01 NOTE — Assessment & Plan Note (Addendum)
May be due to chronic constipation.  CT & ultrasound reassuring. CBC, lipase, CMP

## 2012-12-01 NOTE — Assessment & Plan Note (Addendum)
[name] is a pleasant 62 y.o. female with chronic nausea & vomiting.  EGD last year by Dr. Karilyn Cota showed erosive esophagitis/gastritis.  H pylori negative.  I suspect she has diabetic gastroparesis.    Continue Protonix 40 mg daily Gastric emptying study.  Please hold your Reglan 2 days prior to your gastric imaging study

## 2012-12-01 NOTE — Assessment & Plan Note (Signed)
Well controlled on protonix 40mg daily. 

## 2012-12-01 NOTE — Assessment & Plan Note (Addendum)
Fatty liver ?early cirrhosis on ultrasound HepBsAg, HCVab, INR Recommend 1-2# weight loss per week until ideal body weight through exercise & diet. Low fat/cholesterol diet.   Avoid sweets, sodas, fruit juices, sweetened beverages like tea, etc. Gradually increase exercise from 15 min daily up to 1 hr per day 5 days/week. Limit alcohol use.

## 2012-12-01 NOTE — Progress Notes (Signed)
Faxed to PCP

## 2012-12-04 ENCOUNTER — Encounter (HOSPITAL_COMMUNITY): Payer: Self-pay

## 2012-12-04 ENCOUNTER — Encounter (HOSPITAL_COMMUNITY)
Admission: RE | Admit: 2012-12-04 | Discharge: 2012-12-04 | Disposition: A | Discharge status: Home / Self Care | Payer: Medicaid Other | Source: Ambulatory Visit | Attending: Urgent Care | Admitting: Urgent Care

## 2012-12-04 DIAGNOSIS — R109 Unspecified abdominal pain: Secondary | ICD-10-CM | POA: Insufficient documentation

## 2012-12-04 DIAGNOSIS — R112 Nausea with vomiting, unspecified: Secondary | ICD-10-CM | POA: Insufficient documentation

## 2012-12-04 MED ORDER — TECHNETIUM TC 99M SULFUR COLLOID
2.0000 | Freq: Once | INTRAVENOUS | Status: AC | PRN
  Administered 2012-12-04: 2 via ORAL

## 2012-12-04 NOTE — Progress Notes (Signed)
Quick Note:  Please let pt know GES is normal. No evidence of gastroparesis. Did she stop reglan for test? How is she feeling? [id number] L, MD  ______

## 2012-12-05 NOTE — Progress Notes (Signed)
Quick Note:  Randie Heinz! FU OV in 3 months please. Thanks ______

## 2012-12-05 NOTE — Progress Notes (Signed)
Quick Note:  [name], please nic ov in 3 months. ______

## 2012-12-05 NOTE — Progress Notes (Signed)
Quick Note:  Pt is aware. She stated she did stop the reglan for the test and had some pain while they were doing the GES but today she is feeling good and doesn't seem to be having any problems right now.  Dawn, please cc pcp ______

## 2012-12-06 NOTE — Progress Notes (Signed)
Results faxed to PCP 

## 2012-12-09 NOTE — Progress Notes (Signed)
Tobi Bastos, please FU labs inmy absence. Thanks

## 2012-12-21 NOTE — Progress Notes (Signed)
Mailed reminder letter to pt. 

## 2012-12-21 NOTE — Progress Notes (Signed)
I still do not see where labs have been done. Please have patient complete.

## 2013-03-06 ENCOUNTER — Encounter: Payer: Self-pay | Admitting: Internal Medicine

## 2013-04-28 ENCOUNTER — Emergency Department (HOSPITAL_COMMUNITY)
Admission: EM | Admit: 2013-04-28 | Discharge: 2013-04-28 | Disposition: A | Discharge status: Home / Self Care | Payer: Medicaid Other | Attending: Emergency Medicine | Admitting: Emergency Medicine

## 2013-04-28 ENCOUNTER — Encounter (HOSPITAL_COMMUNITY): Payer: Self-pay

## 2013-04-28 DIAGNOSIS — E1169 Type 2 diabetes mellitus with other specified complication: Secondary | ICD-10-CM | POA: Insufficient documentation

## 2013-04-28 DIAGNOSIS — E119 Type 2 diabetes mellitus without complications: Secondary | ICD-10-CM

## 2013-04-28 DIAGNOSIS — R739 Hyperglycemia, unspecified: Secondary | ICD-10-CM

## 2013-04-28 DIAGNOSIS — Z862 Personal history of diseases of the blood and blood-forming organs and certain disorders involving the immune mechanism: Secondary | ICD-10-CM | POA: Insufficient documentation

## 2013-04-28 DIAGNOSIS — Z87891 Personal history of nicotine dependence: Secondary | ICD-10-CM | POA: Insufficient documentation

## 2013-04-28 DIAGNOSIS — Z8639 Personal history of other endocrine, nutritional and metabolic disease: Secondary | ICD-10-CM | POA: Insufficient documentation

## 2013-04-28 DIAGNOSIS — E1149 Type 2 diabetes mellitus with other diabetic neurological complication: Secondary | ICD-10-CM | POA: Insufficient documentation

## 2013-04-28 DIAGNOSIS — R112 Nausea with vomiting, unspecified: Secondary | ICD-10-CM | POA: Insufficient documentation

## 2013-04-28 DIAGNOSIS — I10 Essential (primary) hypertension: Secondary | ICD-10-CM | POA: Insufficient documentation

## 2013-04-28 DIAGNOSIS — E669 Obesity, unspecified: Secondary | ICD-10-CM | POA: Insufficient documentation

## 2013-04-28 DIAGNOSIS — K3184 Gastroparesis: Secondary | ICD-10-CM

## 2013-04-28 DIAGNOSIS — Z8719 Personal history of other diseases of the digestive system: Secondary | ICD-10-CM | POA: Insufficient documentation

## 2013-04-28 DIAGNOSIS — Z79899 Other long term (current) drug therapy: Secondary | ICD-10-CM | POA: Insufficient documentation

## 2013-04-28 DIAGNOSIS — M129 Arthropathy, unspecified: Secondary | ICD-10-CM | POA: Insufficient documentation

## 2013-04-28 DIAGNOSIS — Z794 Long term (current) use of insulin: Secondary | ICD-10-CM | POA: Insufficient documentation

## 2013-04-28 LAB — URINALYSIS, ROUTINE W REFLEX MICROSCOPIC
Glucose, UA: >1000 mg/dL — AB
Hgb urine dipstick: NEGATIVE
Ketones, ur: >80 mg/dL — AB
Protein, ur: NEGATIVE mg/dL

## 2013-04-28 LAB — COMPREHENSIVE METABOLIC PANEL
ALT: 18 U/L (ref 0–35)
AST: 14 U/L (ref 0–37)
CO2: 23 mEq/L (ref 19–32)
Chloride: 92 mEq/L — ABNORMAL LOW (ref 96–112)
GFR calc non Af Amer: >90 mL/min (ref >90)
Sodium: 130 mEq/L — ABNORMAL LOW (ref 135–145)
Total Bilirubin: 0.5 mg/dL (ref 0.3–1.2)

## 2013-04-28 LAB — CBC WITH DIFFERENTIAL/PLATELET
Basophils Absolute: 0 10*3/uL (ref 0.0–0.1)
HCT: 44 % (ref 36.0–46.0)
Lymphocytes Relative: 15 % (ref 12–46)
Neutro Abs: 6.8 10*3/uL (ref 1.7–7.7)
Platelets: 225 10*3/uL (ref 150–400)
RDW: 14.3 % (ref 11.5–15.5)
WBC: 8.4 10*3/uL (ref 4.0–10.5)

## 2013-04-28 LAB — GLUCOSE, CAPILLARY: Glucose-Capillary: 437 mg/dL — ABNORMAL HIGH (ref 70–99)

## 2013-04-28 MED ORDER — ONDANSETRON 8 MG PO TBDP: 8.0000 mg | ORAL_TABLET | Freq: Three times a day (TID) | ORAL | Status: DC | PRN

## 2013-04-28 MED ORDER — OXYCODONE-ACETAMINOPHEN 5-325 MG PO TABS: 1.0000 | ORAL_TABLET | ORAL | Status: DC | PRN

## 2013-04-28 MED ORDER — ONDANSETRON HCL 4 MG/2ML IJ SOLN
INTRAMUSCULAR | Status: AC
  Filled 2013-04-28: qty 2

## 2013-04-28 MED ORDER — SODIUM CHLORIDE 0.9 % IV BOLUS (SEPSIS)
1000.0000 mL | Freq: Once | INTRAVENOUS | Status: AC
  Administered 2013-04-28: 1000 mL via INTRAVENOUS

## 2013-04-28 MED ORDER — ONDANSETRON HCL 4 MG/2ML IJ SOLN
4.0000 mg | Freq: Once | INTRAMUSCULAR | Status: AC
  Administered 2013-04-28: 4 mg via INTRAVENOUS

## 2013-04-28 MED ORDER — HYDROMORPHONE HCL PF 1 MG/ML IJ SOLN
1.0000 mg | Freq: Once | INTRAMUSCULAR | Status: AC
  Administered 2013-04-28: 1 mg via INTRAVENOUS
  Filled 2013-04-28: qty 1

## 2013-04-28 MED ORDER — INSULIN ASPART 100 UNIT/ML ~~LOC~~ SOLN
10.0000 [IU] | Freq: Once | SUBCUTANEOUS | Status: AC
  Administered 2013-04-28: 10 [IU] via INTRAVENOUS

## 2013-04-28 NOTE — ED Notes (Signed)
Having real bad abdominal pain, started vomiting around 1100 today per pt.

## 2013-04-28 NOTE — ED Provider Notes (Signed)
CSN: [id number]     Arrival date & time 04/28/13  1940 History    This chart was scribed for Hilario Quarry, MD by Quintella Reichert, ED scribe.  This patient was seen in room APA11/APA11 and the patient's care was started at 8:18 PM.     Chief Complaint  Patient presents with  . Emesis  . Abdominal Pain    Patient is a 62 y.o. female presenting with abdominal pain. The history is provided by the patient and medical records. No language interpreter was used.  Abdominal Pain Pain location: Lower. Onset quality:  Gradual Duration:  9 hours Timing:  Constant Progression:  Worsening Chronicity:  Recurrent Relieved by:  Nothing Worsened by:  Nothing tried Associated symptoms: nausea and vomiting   Vomiting:    Quality:  Stomach contents   Severity:  Severe   Duration:  9 hours   Vomiting timing: Every 30 minutes. Risk factors: multiple surgeries, obesity and recent hospitalization   Risk factors comment:  Diabetes   HPI Comments: Solomiya Pascale is a 62 y.o. female with h/o DM, possible diabetic gastroparesis, erosive reflux esophagitis/gastritis, TTP, thyroid disease, anemia and HTN who presents to the Emergency Department complaining of 9 hours of severe nausea and vomiting with accompanying gradually-worsening moderate lower abdominal pain.  Pt states that she has vomited every 30 minutes.  She denies blood or coffee ground vomit.  She has not been able to tolerate any food or liquids since her symptoms began.  Pt has experienced these symptoms in intermittent episodes for some time and has been seen in the ED 4 times prior for the same and was briefly hospitalized in October 2013.  Past providers have been concerned that symptoms may be due to diabetic gastroparesis although a gastric emptying study in March 2014 was normal.  She has also been diagnosed with erosive reflux esophagitis/gastritis, chronic constipation, fatty liver, and possible early cirrhosis based on ultrasound.  Presently  she is medicating with Reglan, Protonix and a stool softener.  Surgical history includes partial splenectomy and partial hysterectomy.     PCP is Dr. Juanetta Gosling Primary Gastroenterologist is Dr. Jena Gauss   Past Medical History  Diagnosis Date  . Diabetes mellitus   . Hypertension   . Arthritis   . TTP (thrombotic thrombocytopenic purpura)   . Erosive esophagitis   . Gastritis   . Anemia   . Thyroid disease   . Duodenal ulcer     Age 54  . Obese   . Short-term memory loss     Past Surgical History  Procedure Laterality Date  . Splenectomy, partial    . Ectopic pregnancy surgery    . Esophagogastroduodenoscopy  04/20/2012    [id number] antral gastritis/Erosive reflux esophagitis  . Colonoscopy  08/29/2012    [id number] diverticulosis    Family History  Problem Relation Age of Onset  . Heart failure Mother   . Cirrhosis Father     ETOH  . Colon cancer Neg Hx   . Diabetes Daughter     History  Substance Use Topics  . Smoking status: Former Smoker -- 0.25 packs/day for 10 years    Types: Cigarettes  . Smokeless tobacco: Not on file  . Alcohol Use: No    OB History   Grav Para Term Preterm Abortions TAB SAB Ect Mult Living                   Review of Systems  Gastrointestinal: Positive for nausea, vomiting and abdominal  pain.  All other systems reviewed and are negative.      Allergies  Review of patient's allergies indicates no known allergies.  Home Medications   Current Outpatient Rx  Name  Route  Sig  Dispense  Refill  . acetaminophen (TYLENOL) 500 MG tablet   Oral   Take 1,000 mg by mouth every 6 (six) hours as needed. Pain.         . insulin glargine (LANTUS) 100 UNIT/ML injection   Subcutaneous   Inject 35 Units into the skin 2 (two) times daily.         . insulin lispro (HUMALOG) 100 UNIT/ML injection   Subcutaneous   Inject 5 Units into the skin 3 (three) times daily before meals.         Marland Kitchen losartan (COZAAR) 50 MG tablet   Oral    Take 50 mg by mouth daily.         . metoCLOPramide (REGLAN) 5 MG tablet   Oral   Take 1 tablet (5 mg total) by mouth 4 (four) times daily.   120 tablet   5   . pantoprazole (PROTONIX) 40 MG tablet   Oral   Take 1 tablet (40 mg total) by mouth daily.   30 tablet   3   . docusate sodium (STOOL SOFTENER) 100 MG capsule   Oral   Take 100 mg by mouth daily as needed. For relief          BP 168/102  Pulse 106  Temp(Src) 98.5 F (36.9 C) (Oral)  Resp 20  Ht 5\' 8"  (1.727 m)  Wt 213 lb (96.616 kg)  BMI 32.39 kg/m2  SpO2 100%  Physical Exam  Nursing note and vitals reviewed. Constitutional: She is oriented to person, place, and time. She appears well-developed and well-nourished.  HENT:  Head: Normocephalic and atraumatic.  Right Ear: External ear normal.  Left Ear: External ear normal.  Nose: Nose normal.  Mouth/Throat: Oropharynx is clear and moist.  Eyes: Conjunctivae and EOM are normal. Pupils are equal, round, and reactive to light.  Neck: Normal range of motion. Neck supple.  Cardiovascular: Normal rate, regular rhythm, normal heart sounds and intact distal pulses.   Pulmonary/Chest: Effort normal and breath sounds normal.  Abdominal: Soft. Bowel sounds are normal.  Musculoskeletal: Normal range of motion.  Neurological: She is alert and oriented to person, place, and time. She has normal reflexes.  Skin: Skin is warm and dry.  Psychiatric: She has a normal mood and affect. Her behavior is normal. Judgment and thought content normal.    ED Course  Procedures (including critical care time)  DIAGNOSTIC STUDIES: Oxygen Saturation is 100% on room air, normal by my interpretation.    COORDINATION OF CARE: 8:24 PM-Discussed treatment plan which includes anti-emetics, pain medication and labs with pt at bedside and pt agreed to plan.    Labs Reviewed  CBC WITH DIFFERENTIAL - Abnormal; Notable for the following:    RBC 5.36 (*)    Neutrophils Relative % 82 (*)     All other components within normal limits  COMPREHENSIVE METABOLIC PANEL - Abnormal; Notable for the following:    Sodium 130 (*)    Chloride 92 (*)    Glucose, Bld 472 (*)    Total Protein 9.1 (*)    All other components within normal limits  URINALYSIS, ROUTINE W REFLEX MICROSCOPIC - Abnormal; Notable for the following:    Glucose, UA >1000 (*)    Ketones, ur >  80 (*)    All other components within normal limits  GLUCOSE, CAPILLARY - Abnormal; Notable for the following:    Glucose-Capillary 437 (*)    All other components within normal limits  LIPASE, BLOOD  URINE MICROSCOPIC-ADD ON    No results found.  No diagnosis found.   MDM  Patient pain free prior to my evaluation and abdomen is soft and nontender. Patient reexamined and continues soft and nontender.  Labs normal except hyperglycemia which is resolving here with iv fluids and insulin.  Plan percocet and zofran for home tx of patient's diabetic gastroparesis.  She will follow up with Dr. Juanetta Gosling next week.    Hilario Quarry, MD 04/28/13 2239

## 2014-02-12 ENCOUNTER — Encounter (HOSPITAL_COMMUNITY): Payer: Self-pay | Admitting: Emergency Medicine

## 2014-02-12 DIAGNOSIS — I10 Essential (primary) hypertension: Secondary | ICD-10-CM | POA: Diagnosis present

## 2014-02-12 DIAGNOSIS — M129 Arthropathy, unspecified: Secondary | ICD-10-CM | POA: Diagnosis present

## 2014-02-12 DIAGNOSIS — Z87891 Personal history of nicotine dependence: Secondary | ICD-10-CM | POA: Diagnosis not present

## 2014-02-12 DIAGNOSIS — K219 Gastro-esophageal reflux disease without esophagitis: Secondary | ICD-10-CM | POA: Diagnosis not present

## 2014-02-12 DIAGNOSIS — Z8249 Family history of ischemic heart disease and other diseases of the circulatory system: Secondary | ICD-10-CM

## 2014-02-12 DIAGNOSIS — E1149 Type 2 diabetes mellitus with other diabetic neurological complication: Secondary | ICD-10-CM | POA: Diagnosis present

## 2014-02-12 DIAGNOSIS — Z833 Family history of diabetes mellitus: Secondary | ICD-10-CM | POA: Diagnosis not present

## 2014-02-12 DIAGNOSIS — E669 Obesity, unspecified: Secondary | ICD-10-CM | POA: Diagnosis present

## 2014-02-12 DIAGNOSIS — Z6834 Body mass index (BMI) 34.0-34.9, adult: Secondary | ICD-10-CM

## 2014-02-12 DIAGNOSIS — E860 Dehydration: Secondary | ICD-10-CM | POA: Diagnosis present

## 2014-02-12 DIAGNOSIS — K3184 Gastroparesis: Secondary | ICD-10-CM | POA: Diagnosis present

## 2014-02-12 DIAGNOSIS — Z9089 Acquired absence of other organs: Secondary | ICD-10-CM | POA: Diagnosis not present

## 2014-02-12 DIAGNOSIS — R112 Nausea with vomiting, unspecified: Secondary | ICD-10-CM | POA: Diagnosis not present

## 2014-02-12 DIAGNOSIS — E109 Type 1 diabetes mellitus without complications: Secondary | ICD-10-CM | POA: Diagnosis not present

## 2014-02-12 DIAGNOSIS — Z794 Long term (current) use of insulin: Secondary | ICD-10-CM

## 2014-02-12 DIAGNOSIS — E872 Acidosis, unspecified: Secondary | ICD-10-CM | POA: Diagnosis present

## 2014-02-12 DIAGNOSIS — E119 Type 2 diabetes mellitus without complications: Secondary | ICD-10-CM | POA: Diagnosis not present

## 2014-02-12 DIAGNOSIS — E869 Volume depletion, unspecified: Secondary | ICD-10-CM | POA: Diagnosis not present

## 2014-02-12 NOTE — ED Notes (Signed)
Patient c/o abdominal pain and nausea x 4 days.

## 2014-02-13 ENCOUNTER — Inpatient Hospital Stay (HOSPITAL_COMMUNITY)
Admission: EM | Admit: 2014-02-13 | Discharge: 2014-02-15 | DRG: 074 | Disposition: A | Discharge status: Home / Self Care | Payer: Medicare Other | Attending: Pulmonary Disease | Admitting: Pulmonary Disease

## 2014-02-13 DIAGNOSIS — K3184 Gastroparesis: Secondary | ICD-10-CM | POA: Diagnosis not present

## 2014-02-13 DIAGNOSIS — E109 Type 1 diabetes mellitus without complications: Secondary | ICD-10-CM | POA: Diagnosis not present

## 2014-02-13 DIAGNOSIS — E860 Dehydration: Secondary | ICD-10-CM | POA: Diagnosis present

## 2014-02-13 DIAGNOSIS — R112 Nausea with vomiting, unspecified: Secondary | ICD-10-CM | POA: Diagnosis not present

## 2014-02-13 DIAGNOSIS — E8889 Other specified metabolic disorders: Secondary | ICD-10-CM

## 2014-02-13 DIAGNOSIS — K219 Gastro-esophageal reflux disease without esophagitis: Secondary | ICD-10-CM | POA: Diagnosis not present

## 2014-02-13 DIAGNOSIS — E1165 Type 2 diabetes mellitus with hyperglycemia: Secondary | ICD-10-CM | POA: Diagnosis present

## 2014-02-13 DIAGNOSIS — R1030 Lower abdominal pain, unspecified: Secondary | ICD-10-CM | POA: Diagnosis present

## 2014-02-13 DIAGNOSIS — E869 Volume depletion, unspecified: Secondary | ICD-10-CM | POA: Diagnosis not present

## 2014-02-13 DIAGNOSIS — R739 Hyperglycemia, unspecified: Secondary | ICD-10-CM | POA: Diagnosis present

## 2014-02-13 LAB — CBG MONITORING, ED
GLUCOSE-CAPILLARY: 246 mg/dL — AB (ref 70–99)
Glucose-Capillary: 358 mg/dL — ABNORMAL HIGH (ref 70–99)
Glucose-Capillary: 297 mg/dL — ABNORMAL HIGH (ref 70–99)
Glucose-Capillary: 294 mg/dL — ABNORMAL HIGH (ref 70–99)
Glucose-Capillary: 241 mg/dL — ABNORMAL HIGH (ref 70–99)

## 2014-02-13 LAB — GLUCOSE, CAPILLARY
GLUCOSE-CAPILLARY: 175 mg/dL — AB (ref 70–99)
Glucose-Capillary: 140 mg/dL — ABNORMAL HIGH (ref 70–99)
Glucose-Capillary: 122 mg/dL — ABNORMAL HIGH (ref 70–99)
Glucose-Capillary: 127 mg/dL — ABNORMAL HIGH (ref 70–99)
Glucose-Capillary: 126 mg/dL — ABNORMAL HIGH (ref 70–99)
Glucose-Capillary: 137 mg/dL — ABNORMAL HIGH (ref 70–99)
Glucose-Capillary: 154 mg/dL — ABNORMAL HIGH (ref 70–99)
Glucose-Capillary: 164 mg/dL — ABNORMAL HIGH (ref 70–99)
Glucose-Capillary: 237 mg/dL — ABNORMAL HIGH (ref 70–99)

## 2014-02-13 LAB — CBC WITH DIFFERENTIAL/PLATELET
BASOS PCT: 0 % (ref 0–1)
Basophils Absolute: 0 10*3/uL (ref 0.0–0.1)
EOS PCT: 0 % (ref 0–5)
Eosinophils Absolute: 0 10*3/uL (ref 0.0–0.7)
HCT: 41.2 % (ref 36.0–46.0)
HEMOGLOBIN: 13.8 g/dL (ref 12.0–15.0)
LYMPHS ABS: 1.2 10*3/uL (ref 0.7–4.0)
Lymphocytes Relative: 14 % (ref 12–46)
MCH: 26.8 pg (ref 26.0–34.0)
MCHC: 33.5 g/dL (ref 30.0–36.0)
MCV: 80 fL (ref 78.0–100.0)
MONOS PCT: 5 % (ref 3–12)
Monocytes Absolute: 0.4 10*3/uL (ref 0.1–1.0)
Neutro Abs: 7.1 10*3/uL (ref 1.7–7.7)
Neutrophils Relative %: 81 % — ABNORMAL HIGH (ref 43–77)
PLATELETS: 217 10*3/uL (ref 150–400)
RBC: 5.15 MIL/uL — AB (ref 3.87–5.11)
RDW: 14.7 % (ref 11.5–15.5)
WBC: 8.7 10*3/uL (ref 4.0–10.5)

## 2014-02-13 LAB — URINALYSIS, ROUTINE W REFLEX MICROSCOPIC
BILIRUBIN URINE: NEGATIVE
Glucose, UA: >1000 mg/dL — AB
HGB URINE DIPSTICK: NEGATIVE
KETONES UR: 40 mg/dL — AB
Leukocytes, UA: NEGATIVE
Nitrite: NEGATIVE
Protein, ur: NEGATIVE mg/dL
SPECIFIC GRAVITY, URINE: 1.015 (ref 1.005–1.030)
Urobilinogen, UA: 0.2 mg/dL (ref 0.0–1.0)
pH: 6 (ref 5.0–8.0)

## 2014-02-13 LAB — COMPREHENSIVE METABOLIC PANEL
ALT: 17 U/L (ref 0–35)
AST: 15 U/L (ref 0–37)
Albumin: 3.7 g/dL (ref 3.5–5.2)
Alkaline Phosphatase: 84 U/L (ref 39–117)
BILIRUBIN TOTAL: 0.7 mg/dL (ref 0.3–1.2)
BUN: 17 mg/dL (ref 6–23)
CHLORIDE: 89 meq/L — AB (ref 96–112)
CO2: 22 meq/L (ref 19–32)
Calcium: 9.4 mg/dL (ref 8.4–10.5)
Creatinine, Ser: 0.79 mg/dL (ref 0.50–1.10)
GFR calc Af Amer: >90 mL/min (ref >90)
GFR calc non Af Amer: 86 mL/min — ABNORMAL LOW (ref >90)
Glucose, Bld: 365 mg/dL — ABNORMAL HIGH (ref 70–99)
Potassium: 4.3 mEq/L (ref 3.7–5.3)
SODIUM: 131 meq/L — AB (ref 137–147)
Total Protein: 8.6 g/dL — ABNORMAL HIGH (ref 6.0–8.3)

## 2014-02-13 LAB — HEMOGLOBIN A1C
Hgb A1c MFr Bld: 12.3 % — ABNORMAL HIGH (ref <5.7)
Mean Plasma Glucose: 306 mg/dL — ABNORMAL HIGH (ref <117)

## 2014-02-13 LAB — URINE MICROSCOPIC-ADD ON

## 2014-02-13 LAB — TROPONIN I

## 2014-02-13 LAB — MRSA PCR SCREENING: MRSA BY PCR: NEGATIVE

## 2014-02-13 MED ORDER — ENOXAPARIN SODIUM 40 MG/0.4ML ~~LOC~~ SOLN
40.0000 mg | SUBCUTANEOUS | Status: DC
  Administered 2014-02-13 – 2014-02-15 (×3): 40 mg via SUBCUTANEOUS
  Filled 2014-02-13 (×3): qty 0.4

## 2014-02-13 MED ORDER — SODIUM CHLORIDE 0.9 % IV SOLN
1000.0000 mL | INTRAVENOUS | Status: DC
  Administered 2014-02-13: 1000 mL via INTRAVENOUS

## 2014-02-13 MED ORDER — METOCLOPRAMIDE HCL 10 MG PO TABS
5.0000 mg | ORAL_TABLET | Freq: Four times a day (QID) | ORAL | Status: DC
  Administered 2014-02-13 (×2): 5 mg via ORAL
  Filled 2014-02-13 (×2): qty 1

## 2014-02-13 MED ORDER — HYDROCODONE-ACETAMINOPHEN 5-325 MG PO TABS: 1.0000 | ORAL_TABLET | ORAL | Status: DC | PRN

## 2014-02-13 MED ORDER — SODIUM CHLORIDE 0.9 % IJ SOLN
3.0000 mL | Freq: Two times a day (BID) | INTRAMUSCULAR | Status: DC
  Administered 2014-02-13 – 2014-02-15 (×3): 3 mL via INTRAVENOUS

## 2014-02-13 MED ORDER — SODIUM CHLORIDE 0.9 % IV SOLN
INTRAVENOUS | Status: DC
  Administered 2014-02-13: 2.4 [IU]/h via INTRAVENOUS
  Filled 2014-02-13: qty 1

## 2014-02-13 MED ORDER — INSULIN GLARGINE 100 UNIT/ML ~~LOC~~ SOLN
15.0000 [IU] | Freq: Every day | SUBCUTANEOUS | Status: DC
  Administered 2014-02-13: 15 [IU] via SUBCUTANEOUS
  Filled 2014-02-13 (×2): qty 0.15

## 2014-02-13 MED ORDER — SODIUM CHLORIDE 0.9 % IV SOLN
INTRAVENOUS | Status: DC
  Administered 2014-02-13: 13:00:00 via INTRAVENOUS

## 2014-02-13 MED ORDER — ONDANSETRON HCL 4 MG/2ML IJ SOLN: 4.0000 mg | Freq: Three times a day (TID) | INTRAMUSCULAR | Status: AC | PRN

## 2014-02-13 MED ORDER — LOSARTAN POTASSIUM 50 MG PO TABS
50.0000 mg | ORAL_TABLET | Freq: Every day | ORAL | Status: DC
  Administered 2014-02-13 – 2014-02-15 (×3): 50 mg via ORAL
  Filled 2014-02-13 (×3): qty 1

## 2014-02-13 MED ORDER — INSULIN ASPART 100 UNIT/ML ~~LOC~~ SOLN
0.0000 [IU] | Freq: Every day | SUBCUTANEOUS | Status: DC
  Administered 2014-02-13 – 2014-02-14 (×2): 2 [IU] via SUBCUTANEOUS

## 2014-02-13 MED ORDER — ONDANSETRON HCL 4 MG/2ML IJ SOLN
4.0000 mg | Freq: Once | INTRAMUSCULAR | Status: AC
  Administered 2014-02-13: 4 mg via INTRAVENOUS
  Filled 2014-02-13: qty 2

## 2014-02-13 MED ORDER — PANTOPRAZOLE SODIUM 40 MG PO TBEC
40.0000 mg | DELAYED_RELEASE_TABLET | Freq: Two times a day (BID) | ORAL | Status: DC
  Administered 2014-02-13 – 2014-02-15 (×4): 40 mg via ORAL
  Filled 2014-02-13 (×4): qty 1

## 2014-02-13 MED ORDER — DEXTROSE-NACL 5-0.45 % IV SOLN
INTRAVENOUS | Status: DC
  Administered 2014-02-13: 10:00:00 via INTRAVENOUS

## 2014-02-13 MED ORDER — ONDANSETRON 4 MG PO TBDP
4.0000 mg | ORAL_TABLET | Freq: Once | ORAL | Status: AC
  Administered 2014-02-13: 4 mg via ORAL
  Filled 2014-02-13: qty 1

## 2014-02-13 MED ORDER — PROMETHAZINE HCL 25 MG/ML IJ SOLN
12.5000 mg | INTRAMUSCULAR | Status: DC | PRN
  Administered 2014-02-13: 12.5 mg via INTRAVENOUS
  Filled 2014-02-13: qty 1

## 2014-02-13 MED ORDER — ACETAMINOPHEN 325 MG PO TABS: 650.0000 mg | ORAL_TABLET | Freq: Four times a day (QID) | ORAL | Status: DC | PRN

## 2014-02-13 MED ORDER — SODIUM CHLORIDE 0.9 % IV BOLUS (SEPSIS)
1000.0000 mL | Freq: Once | INTRAVENOUS | Status: AC
  Administered 2014-02-13: 1000 mL via INTRAVENOUS

## 2014-02-13 MED ORDER — METOCLOPRAMIDE HCL 5 MG/ML IJ SOLN
5.0000 mg | Freq: Four times a day (QID) | INTRAMUSCULAR | Status: DC
  Administered 2014-02-13 – 2014-02-14 (×5): 5 mg via INTRAVENOUS
  Filled 2014-02-13 (×5): qty 2

## 2014-02-13 MED ORDER — INSULIN ASPART 100 UNIT/ML ~~LOC~~ SOLN
0.0000 [IU] | Freq: Three times a day (TID) | SUBCUTANEOUS | Status: DC
  Administered 2014-02-13 – 2014-02-14 (×2): 4 [IU] via SUBCUTANEOUS
  Administered 2014-02-14 (×2): 7 [IU] via SUBCUTANEOUS
  Administered 2014-02-15: 4 [IU] via SUBCUTANEOUS

## 2014-02-13 MED ORDER — PANTOPRAZOLE SODIUM 40 MG PO TBEC
40.0000 mg | DELAYED_RELEASE_TABLET | Freq: Every day | ORAL | Status: DC
  Administered 2014-02-13: 40 mg via ORAL
  Filled 2014-02-13: qty 1

## 2014-02-13 MED ORDER — DOCUSATE SODIUM 100 MG PO CAPS
100.0000 mg | ORAL_CAPSULE | Freq: Two times a day (BID) | ORAL | Status: DC
  Administered 2014-02-13 – 2014-02-15 (×5): 100 mg via ORAL
  Filled 2014-02-13 (×5): qty 1

## 2014-02-13 MED ORDER — PROMETHAZINE HCL 12.5 MG PO TABS: 12.5000 mg | ORAL_TABLET | Freq: Four times a day (QID) | ORAL | Status: DC | PRN

## 2014-02-13 NOTE — Consult Note (Signed)
Referring Provider: Alonza Bogus, MD Primary Care Physician:  Alonza Bogus, MD Primary Gastroenterologist:  Garfield Cornea, MD   Reason for Consultation:  gastroparesis  HPI: [name] is a 63 y.o. female admitted with four-day history of nausea vomiting. We were asked to see the patient by Dr. Luan Pulling for these symptoms. Vomiting has been persistent since Friday. States she had been doing very well up until this time. She is on Reglan 5 mg before meals and at bedtime. We have seen the patient previously for nausea vomiting. Required multiple hospitalizations back in 2013/14. EGD in 2013 by Dr. Laural Golden showed erosive reflux esophagitis/gastritis. Placed on Reglan at that time. Colonoscopy in 2013 by Dr. Gala Romney, colonic diverticulosis. We last saw the patient in March of 2014. At that time she underwent a gastric emptying study off of Reglan, study was normal. Also with history of abdominal ultrasound last year that showed mildly nodular hepatic contour.  Patient denies any heartburn, dysphagia, abdominal pain, constipation, diarrhea, melena, rectal bleeding. No recent ill contacts. Still remains nauseated today but no vomiting.    Prior to Admission medications   Medication Sig Start Date End Date Taking? Authorizing Provider  acetaminophen (TYLENOL) 500 MG tablet Take 1,000 mg by mouth every 6 (six) hours as needed. Pain.   Yes Historical Provider, MD  insulin glargine (LANTUS) 100 UNIT/ML injection Inject 35 Units into the skin 2 (two) times daily.   Yes Historical Provider, MD  insulin lispro (HUMALOG) 100 UNIT/ML injection Inject 5 Units into the skin 3 (three) times daily before meals.   Yes Historical Provider, MD  losartan (COZAAR) 50 MG tablet Take 50 mg by mouth daily.   Yes Historical Provider, MD  metoCLOPramide (REGLAN) 5 MG tablet Take 1 tablet (5 mg total) by mouth 4 (four) times daily. 07/10/12  Yes Alonza Bogus, MD  pantoprazole (PROTONIX) 40 MG tablet Take 40 mg by  mouth daily.   Yes Historical Provider, MD    Current Facility-Administered Medications  Medication Dose Route Frequency Provider Last Rate Last Dose  . 0.9 %  sodium chloride infusion  1,000 mL Intravenous Continuous Johnna Acosta, MD 125 mL/hr at 02/13/14 0418 1,000 mL at 02/13/14 0418  . acetaminophen (TYLENOL) tablet 650 mg  650 mg Oral Q6H PRN Alonza Bogus, MD      . dextrose 5 %-0.45 % sodium chloride infusion   Intravenous Continuous Johnna Acosta, MD 75 mL/hr at 02/13/14 0935    . docusate sodium (COLACE) capsule 100 mg  100 mg Oral BID Alonza Bogus, MD   100 mg at 02/13/14 0950  . enoxaparin (LOVENOX) injection 40 mg  40 mg Subcutaneous Q24H Alonza Bogus, MD   40 mg at 02/13/14 0950  . HYDROcodone-acetaminophen (NORCO/VICODIN) 5-325 MG per tablet 1-2 tablet  1-2 tablet Oral Q4H PRN Alonza Bogus, MD      . insulin regular (NOVOLIN R,HUMULIN R) 1 Units/mL in sodium chloride 0.9 % 100 mL infusion   Intravenous Continuous Johnna Acosta, MD 1.9 mL/hr at 02/13/14 1130 1.9 Units/hr at 02/13/14 1130  . losartan (COZAAR) tablet 50 mg  50 mg Oral Daily Alonza Bogus, MD   50 mg at 02/13/14 0950  . metoCLOPramide (REGLAN) tablet 5 mg  5 mg Oral QID Alonza Bogus, MD   5 mg at 02/13/14 0950  . ondansetron (ZOFRAN) injection 4 mg  4 mg Intravenous Q8H PRN Johnna Acosta, MD      . pantoprazole (  PROTONIX) EC tablet 40 mg  40 mg Oral Daily Alonza Bogus, MD   40 mg at 02/13/14 0950  . promethazine (PHENERGAN) injection 12.5 mg  12.5 mg Intravenous Q4H PRN Alonza Bogus, MD   12.5 mg at 02/13/14 0809  . promethazine (PHENERGAN) tablet 12.5 mg  12.5 mg Oral Q6H PRN Alonza Bogus, MD      . sodium chloride 0.9 % injection 3 mL  3 mL Intravenous Q12H Alonza Bogus, MD        Allergies as of 02/12/2014  . (No Known Allergies)    Past Medical History  Diagnosis Date  . Diabetes mellitus   . Hypertension   . Arthritis   . TTP (thrombotic thrombocytopenic purpura)    . Erosive esophagitis   . Gastritis   . Anemia   . Thyroid disease   . Duodenal ulcer     Age 22  . Obese   . Short-term memory loss     Past Surgical History  Procedure Laterality Date  . Splenectomy, partial    . Ectopic pregnancy surgery    . Esophagogastroduodenoscopy  04/20/2012    FTD:DUKGURK antral gastritis/Erosive reflux esophagitis  . Colonoscopy  08/29/2012    YHC:WCBJSEG diverticulosis    Family History  Problem Relation Age of Onset  . Heart failure Mother   . Cirrhosis Father     ETOH  . Colon cancer Neg Hx   . Diabetes Daughter     History   Social History  . Marital Status: Divorced    Spouse Name: N/A    Number of Children: 3  . Years of Education: N/A   Occupational History  . disabled    Social History Main Topics  . Smoking status: Former Smoker -- 0.25 packs/day for 10 years    Types: Cigarettes  . Smokeless tobacco: Not on file  . Alcohol Use: No  . Drug Use: No  . Sexual Activity: Not on file   Other Topics Concern  . Not on file   Social History Narrative   Lives w/ daughter, [name]           ROS:  General: Negative for anorexia, weight loss, fever, chills, fatigue, weakness. Eyes: Negative for vision changes.  ENT: Negative for hoarseness, difficulty swallowing , nasal congestion. CV: Negative for chest pain, angina, palpitations, dyspnea on exertion, peripheral edema.  Respiratory: Negative for dyspnea at rest, dyspnea on exertion, cough, sputum, wheezing.  GI: See history of present illness. GU:  Negative for dysuria, hematuria, urinary incontinence, urinary frequency, nocturnal urination.  MS: Negative for joint pain, low back pain.  Derm: Negative for rash or itching.  Neuro: Negative for weakness, abnormal sensation, seizure, frequent headaches, memory loss, confusion.  Psych: Negative for anxiety, depression, suicidal ideation, hallucinations.  Endo: Negative for unusual weight change.  Heme: Negative for  bruising or bleeding. Allergy: Negative for rash or hives.       Physical Examination: Vital signs in last 24 hours: Temp:  [98.6 F (37 C)] 98.6 F (37 C) (05/26 2327) Pulse Rate:  [50-109] 109 (05/27 0830) Resp:  [15-20] 19 (05/27 0830) BP: (129-189)/(72-129) 150/92 mmHg (05/27 0830) SpO2:  [93 %-100 %] 95 % (05/27 0830) Weight:  [205 lb 14.6 oz (93.4 kg)-220 lb (99.791 kg)] 205 lb 14.6 oz (93.4 kg) (05/27 1100)    General: Well-nourished, well-developed in no acute distress. drowsy Head: Normocephalic, atraumatic.   Eyes: Conjunctiva pink, no icterus. Mouth: Oropharyngeal mucosa moist and  pink , no lesions erythema or exudate. Neck: Supple without thyromegaly, masses, or lymphadenopathy.  Lungs: Clear to auscultation bilaterally.  Heart: Regular rate and rhythm, no murmurs rubs or gallops.  Abdomen: Bowel sounds are normal, nontender, nondistended, no hepatosplenomegaly or masses, no abdominal bruits or    hernia , no rebound or guarding.   Rectal: not performed Extremities: No lower extremity edema, clubbing, deformity.  Neuro: Alert and oriented x 4 , grossly normal neurologically.  Skin: Warm and dry, no rash or jaundice.   Psych: Alert and cooperative, normal mood and affect.        Intake/Output from previous day:   Intake/Output this shift:    Lab Results: CBC  Recent Labs  02/13/14 0229  WBC 8.7  HGB 13.8  HCT 41.2  MCV 80.0  PLT 217   BMET  Recent Labs  02/13/14 0229  NA 131*  K 4.3  CL 89*  CO2 22  GLUCOSE 365*  BUN 17  CREATININE 0.79  CALCIUM 9.4   LFT  Recent Labs  02/13/14 0229  BILITOT 0.7  ALKPHOS 84  AST 15  ALT 17  PROT 8.6*  ALBUMIN 3.7    Lipase No results found for this basename: LIPASE,  in the last 72 hours  PT/INR No results found for this basename: LABPROT, INR,  in the last 72 hours    Imaging Studies: No results found.Minnie.Brome week]   Impression: 63 year old lady with refractory nausea and vomiting since  Friday. She has a history of multiple hospitalizations in the past due to nausea/vomiting but had been doing remarkably well since 2014. Gastric emptying study last year off of Reglan was normal. She does have a history of erosive reflux esophagitis on previous EGD in 2013. Denies any associated abdominal pain. Gallbladder remains in situ. No abdominal films done this admission, Abdominal exam unremarkable and unlikely that she has an obstruction. Given acuteness of symptoms, doubt underlying PUD.  Plan: 1. Increase PPI to BID. 2. Switch Reglan to IV. 3. Continue prn Zofran and phenergan. 4. To discuss with Dr. Gala Romney. Consider abdominal film if symptoms persist. 5. Will offer her clear liquid diet initially, rather than carb modified.  We would like to thank you for the opportunity to participate in the care of Madelyn Flavors.   LOS: 0 days   Mahala Menghini  02/13/2014, 12:01 PM  Attending note:  Patient seen and examined. Agree with above assessment and recommendations as outlined.

## 2014-02-13 NOTE — ED Provider Notes (Signed)
CSN: [id number]     Arrival date & time 02/12/14  2231 History   First MD Initiated Contact with Patient 02/13/14 [phone]     Chief Complaint  Patient presents with  . Nausea     (Consider location/radiation/quality/duration/timing/severity/associated sxs/prior Treatment) HPI Comments: Pt is a 62 y/o female with hx of DM and gastroparesis who presents with c/o n/v X 4 days - nothing makes better or worse, not taking her DM meds and not having diarrhea or fevers.  Sx are persistsent and now severe - she has vomited so much that she has abdominal pain which is diffuse / non focal and persistent,, but worse with vomiting.  She denies CP, SOB or headaches.  The history is provided by the patient.    Past Medical History  Diagnosis Date  . Diabetes mellitus   . Hypertension   . Arthritis   . TTP (thrombotic thrombocytopenic purpura)   . Erosive esophagitis   . Gastritis   . Anemia   . Thyroid disease   . Duodenal ulcer     Age 65  . Obese   . Short-term memory loss    Past Surgical History  Procedure Laterality Date  . Splenectomy, partial    . Ectopic pregnancy surgery    . Esophagogastroduodenoscopy  04/20/2012    PJA:[id number] antral gastritis/Erosive reflux esophagitis  . Colonoscopy  08/29/2012    QBH:ALPFXTK diverticulosis   Family History  Problem Relation Age of Onset  . Heart failure Mother   . Cirrhosis Father     ETOH  . Colon cancer Neg Hx   . Diabetes Daughter    History  Substance Use Topics  . Smoking status: Former Smoker -- 0.25 packs/day for 10 years    Types: Cigarettes  . Smokeless tobacco: Not on file  . Alcohol Use: No   OB History   Grav Para Term Preterm Abortions TAB SAB Ect Mult Living                 Review of Systems  All other systems reviewed and are negative.     Allergies  Review of patient's allergies indicates no known allergies.  Home Medications   Prior to Admission medications   Medication Sig Start Date End Date Taking?  Authorizing Provider  acetaminophen (TYLENOL) 500 MG tablet Take 1,000 mg by mouth every 6 (six) hours as needed. Pain.   Yes Historical Provider, MD  insulin glargine (LANTUS) 100 UNIT/ML injection Inject 35 Units into the skin 2 (two) times daily.   Yes Historical Provider, MD  insulin lispro (HUMALOG) 100 UNIT/ML injection Inject 5 Units into the skin 3 (three) times daily before meals.   Yes Historical Provider, MD  losartan (COZAAR) 50 MG tablet Take 50 mg by mouth daily.   Yes Historical Provider, MD  metoCLOPramide (REGLAN) 5 MG tablet Take 1 tablet (5 mg total) by mouth 4 (four) times daily. 07/10/12  Yes Alonza Bogus, MD  docusate sodium (STOOL SOFTENER) 100 MG capsule Take 100 mg by mouth daily as needed. For relief    Historical Provider, MD  ondansetron (ZOFRAN ODT) 8 MG disintegrating tablet Take 1 tablet (8 mg total) by mouth every 8 (eight) hours as needed for nausea. 04/28/13   Shaune Pollack, MD  oxyCODONE-acetaminophen (PERCOCET/ROXICET) 5-325 MG per tablet Take 1 tablet by mouth every 4 (four) hours as needed for pain. 04/28/13   Shaune Pollack, MD  pantoprazole (PROTONIX) 40 MG tablet Take 1 tablet (40 mg  total) by mouth daily. 04/21/12 04/28/13  Rosita Fire, MD   BP 129/87  Pulse 104  Temp(Src) 98.6 F (37 C) (Oral)  Resp 15  Ht 5\' 7"  (1.702 m)  Wt 220 lb (99.791 kg)  BMI 34.45 kg/m2  SpO2 96% Physical Exam  Nursing note and vitals reviewed. Constitutional: She appears well-developed and well-nourished. No distress.  HENT:  Head: Normocephalic and atraumatic.  Mouth/Throat: Oropharynx is clear and moist. No oropharyngeal exudate.  Eyes: Conjunctivae and EOM are normal. Pupils are equal, round, and reactive to light. Right eye exhibits no discharge. Left eye exhibits no discharge. No scleral icterus.  Neck: Normal range of motion. Neck supple. No JVD present. No thyromegaly present.  Cardiovascular: Normal rate, regular rhythm, normal heart sounds and intact distal  pulses.  Exam reveals no gallop and no friction rub.   No murmur heard. Pulmonary/Chest: Effort normal and breath sounds normal. No respiratory distress. She has no wheezes. She has no rales.  Abdominal: Soft. Bowel sounds are normal. She exhibits no distension and no mass. There is tenderness ( mild diffuse ttp, non focal no guarding or peritoneal signs).  Musculoskeletal: Normal range of motion. She exhibits no edema and no tenderness.  Lymphadenopathy:    She has no cervical adenopathy.  Neurological: She is alert. Coordination normal.  Skin: Skin is warm and dry. No rash noted. No erythema.  Psychiatric: She has a normal mood and affect. Her behavior is normal.    ED Course  Procedures (including critical care time) Labs Review Labs Reviewed  COMPREHENSIVE METABOLIC PANEL - Abnormal; Notable for the following:    Sodium 131 (*)    Chloride 89 (*)    Glucose, Bld 365 (*)    Total Protein 8.6 (*)    GFR calc non Af Amer 86 (*)    All other components within normal limits  CBC WITH DIFFERENTIAL - Abnormal; Notable for the following:    RBC 5.15 (*)    Neutrophils Relative % 81 (*)    All other components within normal limits  URINALYSIS, ROUTINE W REFLEX MICROSCOPIC - Abnormal; Notable for the following:    Glucose, UA >1000 (*)    Ketones, ur 40 (*)    All other components within normal limits  URINE MICROSCOPIC-ADD ON - Abnormal; Notable for the following:    Squamous Epithelial / LPF MANY (*)    Bacteria, UA MANY (*)    All other components within normal limits  CBG MONITORING, ED - Abnormal; Notable for the following:    Glucose-Capillary 358 (*)    All other components within normal limits  CBG MONITORING, ED - Abnormal; Notable for the following:    Glucose-Capillary 297 (*)    All other components within normal limits  TROPONIN I    Imaging Review No results found.   EKG Interpretation   Date/Time:  Wednesday Feb 13 2014 02:53:11 EDT Ventricular Rate:   107 PR Interval:  179 QRS Duration: 140 QT Interval:  403 QTC Calculation: 538 R Axis:   36 Text Interpretation:  Sinus tachycardia Probable left atrial enlargement  Left bundle branch block Abnormal ekg Since last tracing from 11/13, now  with LBBB Confirmed by Alaster Asfaw  MD, Shonice Wrisley ([id number]) on 02/13/2014 3:05:21 AM      ED ECG REPORT  I personally interpreted this EKG   Date: 02/13/2014 5:43 AM   Rate: 101  Rhythm: sinus tachycardia  QRS Axis: normal  Intervals: normal  ST/T Wave abnormalities: nonspecific T wave  changes  Conduction Disutrbances:none  Narrative Interpretation:   Old EKG Reviewed: changes noted   MDM   Final diagnoses:  Hyperglycemia  Ketosis  Dehydration  Gastroparesis    Pt is non focal - possible gastroparesis, DKA, UTI or other source, labs and fluids and meds.  The pt has a wide AG, but has normal CO2 at 22.  i suspect that she has some dehydration ketosis.  She has required insulin drip and multiple boluses of NS.  She has persistent mild tachycardia and ongoing nausea despite IV anti emetics.  Her initial ECG showed a LBBB, however on repeat ECG this has resolved - there is now some abnormal T waves that were not seen on prior ECG.   I have discussed the care with Dr. Willey Blade on call for Dr. Luan Pulling who agrees with admission to the hospital - requested holding orders to Dr. Luan Pulling service - accepted.  Pt informed and is in agreement with the plan.  Meds given in ED:  Medications  dextrose 5 %-0.45 % sodium chloride infusion (not administered)  insulin regular (NOVOLIN R,HUMULIN R) 1 Units/mL in sodium chloride 0.9 % 100 mL infusion (4.7 Units/hr Intravenous Rate/Dose Change 02/13/14 0629)  0.9 %  sodium chloride infusion (1,000 mLs Intravenous New Bag/Given 02/13/14 0418)  ondansetron (ZOFRAN) injection 4 mg (not administered)  ondansetron (ZOFRAN-ODT) disintegrating tablet 4 mg (4 mg Oral Given 02/13/14 0226)  sodium chloride 0.9 % bolus 1,000 mL (0  mLs Intravenous Stopped 02/13/14 0417)  ondansetron (ZOFRAN) injection 4 mg (4 mg Intravenous Given 02/13/14 0311)      Johnna Acosta, MD 02/13/14 [phone]

## 2014-02-13 NOTE — Care Management Utilization Note (Signed)
UR completed 

## 2014-02-14 ENCOUNTER — Telehealth: Payer: Self-pay | Admitting: Gastroenterology

## 2014-02-14 DIAGNOSIS — E869 Volume depletion, unspecified: Secondary | ICD-10-CM | POA: Diagnosis not present

## 2014-02-14 DIAGNOSIS — K3184 Gastroparesis: Secondary | ICD-10-CM | POA: Diagnosis not present

## 2014-02-14 DIAGNOSIS — R112 Nausea with vomiting, unspecified: Secondary | ICD-10-CM | POA: Diagnosis not present

## 2014-02-14 DIAGNOSIS — K219 Gastro-esophageal reflux disease without esophagitis: Secondary | ICD-10-CM

## 2014-02-14 DIAGNOSIS — E109 Type 1 diabetes mellitus without complications: Secondary | ICD-10-CM | POA: Diagnosis not present

## 2014-02-14 DIAGNOSIS — E860 Dehydration: Secondary | ICD-10-CM | POA: Diagnosis present

## 2014-02-14 LAB — GLUCOSE, CAPILLARY
GLUCOSE-CAPILLARY: 243 mg/dL — AB (ref 70–99)
GLUCOSE-CAPILLARY: 152 mg/dL — AB (ref 70–99)
Glucose-Capillary: 236 mg/dL — ABNORMAL HIGH (ref 70–99)
Glucose-Capillary: 241 mg/dL — ABNORMAL HIGH (ref 70–99)

## 2014-02-14 LAB — CBC
HCT: 36.6 % (ref 36.0–46.0)
Hemoglobin: 11.7 g/dL — ABNORMAL LOW (ref 12.0–15.0)
MCH: 25.9 pg — AB (ref 26.0–34.0)
MCHC: 32 g/dL (ref 30.0–36.0)
MCV: 81.2 fL (ref 78.0–100.0)
Platelets: 195 10*3/uL (ref 150–400)
RBC: 4.51 MIL/uL (ref 3.87–5.11)
RDW: 15.4 % (ref 11.5–15.5)
WBC: 6.9 10*3/uL (ref 4.0–10.5)

## 2014-02-14 LAB — BASIC METABOLIC PANEL
BUN: 13 mg/dL (ref 6–23)
CALCIUM: 8.7 mg/dL (ref 8.4–10.5)
CO2: 24 meq/L (ref 19–32)
Chloride: 99 mEq/L (ref 96–112)
Creatinine, Ser: 0.8 mg/dL (ref 0.50–1.10)
GFR calc Af Amer: 88 mL/min — ABNORMAL LOW (ref >90)
GFR calc non Af Amer: 76 mL/min — ABNORMAL LOW (ref >90)
GLUCOSE: 239 mg/dL — AB (ref 70–99)
Potassium: 3.5 mEq/L — ABNORMAL LOW (ref 3.7–5.3)
Sodium: 136 mEq/L — ABNORMAL LOW (ref 137–147)

## 2014-02-14 MED ORDER — METOCLOPRAMIDE HCL 10 MG PO TABS
5.0000 mg | ORAL_TABLET | Freq: Three times a day (TID) | ORAL | Status: DC
  Administered 2014-02-14 – 2014-02-15 (×3): 5 mg via ORAL
  Filled 2014-02-14 (×3): qty 1

## 2014-02-14 MED ORDER — INSULIN GLARGINE 100 UNIT/ML ~~LOC~~ SOLN
20.0000 [IU] | Freq: Two times a day (BID) | SUBCUTANEOUS | Status: DC
  Administered 2014-02-14 – 2014-02-15 (×3): 20 [IU] via SUBCUTANEOUS
  Filled 2014-02-14 (×9): qty 0.2

## 2014-02-14 NOTE — H&P (Signed)
[name], [name]:  [id number]  MEDICAL RECORD NO.:  [id number]  LOCATION:  [facility]                         FACILITY:  [facility]  PHYSICIAN:  Sidnie Swalley L. Luan Pulling, M.D.DATE OF BIRTH:  1950/10/05  DATE OF ADMISSION:  02/12/2014 DATE OF DISCHARGE:  LH                             HISTORY & PHYSICAL   REASON FOR ADMISSION:  Nausea, vomiting, dehydration, and uncontrolled blood sugar.  This is a 63 year old, has a long known history of diabetes and who has gastroparesis.  She has had 4-5 day history of nausea and vomiting.  She has been trying to take her medication, but she cannot keep it down. She has not had any diarrhea.  She has not had any fever.  She has complaints of abdominal pain from her vomiting.  PAST MEDICAL HISTORY:  Positive for diabetes, hypertension, arthritis, thrombocytopenic purpura, GERD, gastritis, anemia, thyroid disease, peptic ulcer disease, obesity and some form of short-term memory loss.  Surgically, she has had splenectomy, ectopic pregnancy surgery, colonoscopy.  FAMILY HISTORY:  Positive for heart failure in her mother, diabetes in her daughter, and cirrhosis related to alcohol abuse in her father.  SOCIAL HISTORY:  She does not use any alcohol.  She has about a 2-1/2 pack year smoking history, but stopped many years ago.  REVIEW OF SYSTEMS:  Otherwise is essentially negative.  She has not had any chest pain.  No fever, no chills, no hemoptysis, and she has not had any blood in her emesis.  At home, she has been taking Tylenol as needed, Lantus 35 units b.i.d., Humalog 5 units 3 times a day with meals, losartan 50 mg daily, metoclopramide 5 mg q.i.d., docusate 100 mg b.i.d., Zofran 8 mg as needed every 8 hours for nausea, oxycodone 5/325 one every 4 hours as needed for pain, and Protonix 40 mg daily.  PHYSICAL EXAMINATION:  GENERAL:  Shows a well-developed, well-nourished African-American female, who is in moderate  distress. VITAL SIGNS:  Temperature is 98.6, pulse 106, respirations 20, and blood pressure 189/129.  This was during an episode of vomiting. HEENT:  Her pupils are reactive.  Nose and throat are clear.  Mucous membranes are moist. NECK:  Supple without masses. HEART:  Regular without murmur, gallop, or rub. CHEST:  Clear without wheezes. ABDOMEN:  Fairly soft, but she does have tenderness diffusely that she says is related to her multiple episodes of vomiting. EXTREMITIES:  She has normal range of motion of her extremities.  She has no edema. LYMPH NODES:  She has no cervical adenopathy.  No lymphadenopathy anywhere.  LABORATORY WORK:  Shows that her sodium is 131, chloride 89, glucose was 365.  EKG showed sinus tachycardia.  ASSESSMENT:  Then she has gastroparesis with diabetes and I think she is having an acute exacerbation.  She has been started on insulin drip, given normal saline.  She will be in the step-down unit, given IV fluids.  When she stabilizes she can be switched off of the insulin drip.     Kymberlee Viger L. Luan Pulling, M.D.     ELH/MEDQ  D:  02/13/2014  T:  02/14/2014  Job:  [id number]

## 2014-02-14 NOTE — Progress Notes (Signed)
Subjective: She is much improved. She has no more nausea and wants to be. Her blood sugar is still up somewhat. She has no other new complaints.  Objective: Vital signs in last 24 hours: Temp:  [98.1 F (36.7 C)-98.8 F (37.1 C)] 98.5 F (36.9 C) (05/28 0811) Pulse Rate:  [52-109] 100 (05/28 0600) Resp:  [12-20] 13 (05/28 0600) BP: (78-150)/(45-92) 103/71 mmHg (05/28 0600) SpO2:  [93 %-100 %] 100 % (05/28 0600) Weight:  [93.4 kg (205 lb 14.6 oz)-95 kg (209 lb 7 oz)] 95 kg (209 lb 7 oz) (05/28 0500) Weight change: -6.391 kg (-14 lb 1.5 oz) Last BM Date: 02/11/14  Intake/Output from previous day: 05/27 0701 - 05/28 0700 In: 1263.8 [I.V.:1263.8] Out: -   PHYSICAL EXAM General appearance: alert, cooperative and no distress Resp: clear to auscultation bilaterally Cardio: regular rate and rhythm, S1, S2 normal, no murmur, click, rub or gallop GI: soft, non-tender; bowel sounds normal; no masses,  no organomegaly Extremities: extremities normal, atraumatic, no cyanosis or edema  Lab Results:    Basic Metabolic Panel:  Recent Labs  02/13/14 0229 02/14/14 0439  NA 131* 136*  K 4.3 3.5*  CL 89* 99  CO2 22 24  GLUCOSE 365* 239*  BUN 17 13  CREATININE 0.79 0.80  CALCIUM 9.4 8.7   Liver Function Tests:  Recent Labs  02/13/14 0229  AST 15  ALT 17  ALKPHOS 84  BILITOT 0.7  PROT 8.6*  ALBUMIN 3.7   No results found for this basename: LIPASE, AMYLASE,  in the last 72 hours No results found for this basename: AMMONIA,  in the last 72 hours CBC:  Recent Labs  02/13/14 0229 02/14/14 0439  WBC 8.7 6.9  NEUTROABS 7.1  --   HGB 13.8 11.7*  HCT 41.2 36.6  MCV 80.0 81.2  PLT 217 195   Cardiac Enzymes:  Recent Labs  02/13/14 0432  TROPONINI <0.30   BNP: No results found for this basename: PROBNP,  in the last 72 hours D-Dimer: No results found for this basename: DDIMER,  in the last 72 hours CBG:  Recent Labs  02/13/14 1344 02/13/14 1444 02/13/14 1553  02/13/14 1641 02/13/14 2115 02/14/14 0724  GLUCAP 126* 137* 154* 164* 237* 236*   Hemoglobin A1C:  Recent Labs  02/13/14 0439  HGBA1C 12.3*   Fasting Lipid Panel: No results found for this basename: CHOL, HDL, LDLCALC, TRIG, CHOLHDL, LDLDIRECT,  in the last 72 hours Thyroid Function Tests: No results found for this basename: TSH, T4TOTAL, FREET4, T3FREE, THYROIDAB,  in the last 72 hours Anemia Panel: No results found for this basename: VITAMINB12, FOLATE, FERRITIN, TIBC, IRON, RETICCTPCT,  in the last 72 hours Coagulation: No results found for this basename: LABPROT, INR,  in the last 72 hours Urine Drug Screen: Drugs of Abuse  No results found for this basename: labopia, cocainscrnur, labbenz, amphetmu, thcu, labbarb    Alcohol Level: No results found for this basename: ETH,  in the last 72 hours Urinalysis:  Recent Labs  02/13/14 0257  COLORURINE YELLOW  LABSPEC 1.015  PHURINE 6.0  GLUCOSEU >1000*  HGBUR NEGATIVE  BILIRUBINUR NEGATIVE  KETONESUR 40*  PROTEINUR NEGATIVE  UROBILINOGEN 0.2  NITRITE NEGATIVE  [place]. Labs:  ABGS No results found for this basename: PHART, PCO2, PO2ART, TCO2, HCO3,  in the last 72 hours CULTURES Recent Results (from the past 240 hour(s))  MRSA PCR SCREENING     Status: None   Collection Time  02/13/14  8:55 AM      Result Value Ref Range Status   MRSA by PCR NEGATIVE  NEGATIVE Final   Comment:            The GeneXpert MRSA Assay (FDA     approved for NASAL specimens     only), is one component of a     comprehensive MRSA colonization     surveillance program. It is not     intended to diagnose MRSA     infection nor to guide or     monitor treatment for     MRSA infections.   Studies/Results: No results found.  Medications:  Prior to Admission:  Prescriptions prior to admission  Medication Sig Dispense Refill  . acetaminophen (TYLENOL) 500 MG tablet Take 1,000 mg by mouth every 6 (six) hours  as needed. Pain.      . insulin glargine (LANTUS) 100 UNIT/ML injection Inject 35 Units into the skin 2 (two) times daily.      . insulin lispro (HUMALOG) 100 UNIT/ML injection Inject 5 Units into the skin 3 (three) times daily before meals.      Marland Kitchen losartan (COZAAR) 50 MG tablet Take 50 mg by mouth daily.      . metoCLOPramide (REGLAN) 5 MG tablet Take 1 tablet (5 mg total) by mouth 4 (four) times daily.  120 tablet  5  . pantoprazole (PROTONIX) 40 MG tablet Take 40 mg by mouth daily.       Scheduled: . docusate sodium  100 mg Oral BID  . enoxaparin (LOVENOX) injection  40 mg Subcutaneous Q24H  . insulin aspart  0-20 Units Subcutaneous TID WC  . insulin aspart  0-5 Units Subcutaneous QHS  . insulin glargine  20 Units Subcutaneous BID  . losartan  50 mg Oral Daily  . metoCLOPramide (REGLAN) injection  5 mg Intravenous 4 times per day  . pantoprazole  40 mg Oral BID AC  . sodium chloride  3 mL Intravenous Q12H   Continuous: . sodium chloride 75 mL/hr at 02/13/14 2000   JSE:[id number], HYDROcodone-acetaminophen, promethazine, promethazine  Assesment: She was admitted with uncontrolled diabetes hyperglycemia gastroparesis nausea and vomiting and dehydration. She is much improved. She is not having any further episodes of Active Problems:   Hyperglycemia   Uncontrolled diabetes mellitus    Plan: Transfer to floor increase her diet increase her insulin    LOS: 1 day   [name] 02/14/2014, 8:27 AM

## 2014-02-14 NOTE — Progress Notes (Signed)
      Subjective:  Doing much better. Tolerated advanced diet.   Objective: Vital signs in last 24 hours: Temp:  [98.1 F (36.7 C)-98.8 F (37.1 C)] 98.2 F (36.8 C) (05/28 0400) Pulse Rate:  [52-109] 100 (05/28 0600) Resp:  [12-20] 13 (05/28 0600) BP: (78-155)/(45-116) 103/71 mmHg (05/28 0600) SpO2:  [93 %-100 %] 100 % (05/28 0600) Weight:  [205 lb 14.6 oz (93.4 kg)-209 lb 7 oz (95 kg)] 209 lb 7 oz (95 kg) (05/28 0500) Last BM Date: 02/11/14 General:   Alert,  Well-developed, well-nourished, pleasant and cooperative in NAD Head:  Normocephalic and atraumatic. Eyes:  Sclera clear, no icterus.   Abdomen:  Soft, nontender and nondistended.  Normal bowel sounds, without guarding, and without rebound.   Extremities:  Without clubbing, deformity or edema. Neurologic:  Alert and  oriented x4;  grossly normal neurologically. Skin:  Intact without significant lesions or rashes. Psych:  Alert and cooperative. Normal mood and affect.  Intake/Output from previous day: 05/27 0701 - 05/28 0700 In: 1263.8 [I.V.:1263.8] Out: -  Intake/Output this shift:    Lab Results: CBC  Recent Labs  02/13/14 0229 02/14/14 0439  WBC 8.7 6.9  HGB 13.8 11.7*  HCT 41.2 36.6  MCV 80.0 81.2  PLT 217 195   BMET  Recent Labs  02/13/14 0229 02/14/14 0439  NA 131* 136*  K 4.3 3.5*  CL 89* 99  CO2 22 24  GLUCOSE 365* 239*  BUN 17 13  CREATININE 0.79 0.80  CALCIUM 9.4 8.7   LFTs  Recent Labs  02/13/14 0229  BILITOT 0.7  ALKPHOS 84  AST 15  ALT 17  PROT 8.6*  ALBUMIN 3.7    HgbA1C: 12.3  Assessment: Nausea and vomiting improved. Likely gastroparesis in the setting of poorly controlled diabetes mellitus.     Plan: 1. Will switch Reglan back to PO.  2. Recommend continue PPI BID for one month and then daily.  3. Follow up in office in 2 months. 4. Hopefully home tomorrow.    LOS: 1 day   Mahala Menghini  02/14/2014, 8:07 AM

## 2014-02-14 NOTE — Telephone Encounter (Signed)
Needs E30 hospital f/u for N/V.

## 2014-02-14 NOTE — Progress Notes (Addendum)
Spoke with patient about diabetes and home regimen for diabetes control. Patient reports that she is followed by her PCP (Dr. Luan Pulling) for diabetes management and currently she takes Lantus 35 units BID and Humalog 5 units TID as an outpatient for diabetes control.  Inquired about knowledge about A1C and patient reports that she does know what an W0J is but is uncertain of her last W1X value. Discussed A1C results (12.3% on 02/13/14) and explained basic pathophysiology of DM Type 2, basic home care, importance of checking CBGs and maintaining good CBG control to prevent long-term and short-term complications. Patient reports that she checks blood sugar twice a day and is runs between 200-300's mg/dl usually. Stressed importance of diabetes control to prevent complications related to uncontrolled diabetes. Discussed impact of nutrition, exercise, stress, sickness, and medications on diabetes control. Explained basal and bolus insulin and discussed importance to taking insulin despite not eating or vomiting. Patient states that she has made changes over the past year with her diet and she tries to do the best she can with her diet but has a financial barrier in being able to afford healthy foods.  Discussed carbohydrates, carbohydrate goals per day and meal, along with portion sizes and encouraged patient to limit intake of foods high in carbohydrates (potatoes, pastas, breads, and sweets). Provided written and oral information on free outpatient diabetes education class offered at [facility] and encouraged patient to attend. Patient expressed that she feels her diet and ability to afford healthier foods is a barrier for her and states that she is willing to attend the class to see if she can gain more knowledge on healthier food choices based on a budget.  Patient verbalized understanding of information discussed and she states that she has no further questions at this time related to diabetes.   Thanks, Barnie Alderman, RN, MSN, CCRN Diabetes Coordinator Inpatient Diabetes Program [phone] (Team Pager) [phone] ([place] office) [phone] [facility] office)

## 2014-02-14 NOTE — Care Management Note (Addendum)
    Page 1 of 1   02/15/2014     9:33:29 AM CARE MANAGEMENT NOTE 02/15/2014  Patient:  [name], [name]   Account Number:  [id number]  Date Initiated:  02/21/2014  Documentation initiated by:  Theophilus Kinds  Subjective/Objective Assessment:   Pt admitted from home with DKA. Pt lives with her daughter and will return home at discharge. Pt stated that she is very independent with ADL's and manages her own BS and medications.     Action/Plan:   Pt would like prescrption for new meter. WIll follow for discharge planning needs.   Anticipated DC Date:  02/16/2014   Anticipated DC Plan:  [place]  CM consult      Choice offered to / List presented to:             Status of service:  Completed, signed off Medicare Important Message given?  YES (If response is "NO", the following Medicare IM given date fields will be blank) Date Medicare IM given:  02/15/2014 Date Additional Medicare IM given:    Discharge Disposition:  HOME/SELF CARE  Per UR Regulation:    If discussed at Long Length of Stay Meetings, dates discussed:    Comments:  02/15/14 [place], RN BSN CM Pt discharged home today. Prescription given to pt for new glucosemeter. [facility] referral made and pt aware that they maybe calling pt. Pt and pts nurse aware of discharge arrangements.  02/14/14 [place], RN BSN CM

## 2014-02-14 NOTE — Progress Notes (Signed)
Inpatient Diabetes Program Recommendations  AACE/ADA: New Consensus Statement on Inpatient Glycemic Control (2013)  Target Ranges:  Prepandial:   less than 140 mg/dL      Peak postprandial:   less than 180 mg/dL (1-2 hours)      Critically ill patients:  140 - 180 mg/dL   Results for MOUNA, YAGER (MRN [id number]) as of 02/14/2014 15:01  Ref. Range 02/13/2014 09:32 02/13/2014 10:37 02/13/2014 11:37 02/13/2014 12:35 02/13/2014 13:44 02/13/2014 14:44 02/13/2014 15:53 02/13/2014 16:41 02/13/2014 21:15 02/14/2014 07:24 02/14/2014 11:32  Glucose-Capillary Latest Range: 70-99 mg/dL 175 (H) 140 (H) 122 (H) 127 (H) 126 (H) 137 (H) 154 (H) 164 (H) 237 (H) 236 (H) 243 (H)   Diabetes history: DM2 Outpatient Diabetes medications: Lantus 35 units BID and Humalog 5 units TID  Current orders for Inpatient glycemic control: Lantus 20 units BID, Novolog 0-20 AC, Novolog 0-5 HS  Inpatient Diabetes Program Recommendations Insulin - Basal: Noted Lantus increased today to 20 units BID. Insulin - Meal Coverage: Please consider ordering Novolog 7 units TID with meals for meal coverage.  Thanks, Barnie Alderman, RN, MSN, CCRN Diabetes Coordinator Inpatient Diabetes Program [phone] (Team Pager) [phone] ([place] office) [phone] [facility] office)

## 2014-02-15 ENCOUNTER — Encounter: Payer: Self-pay | Admitting: Gastroenterology

## 2014-02-15 DIAGNOSIS — E869 Volume depletion, unspecified: Secondary | ICD-10-CM | POA: Diagnosis not present

## 2014-02-15 DIAGNOSIS — E109 Type 1 diabetes mellitus without complications: Secondary | ICD-10-CM | POA: Diagnosis not present

## 2014-02-15 DIAGNOSIS — K3184 Gastroparesis: Secondary | ICD-10-CM | POA: Diagnosis not present

## 2014-02-15 LAB — GLUCOSE, CAPILLARY: GLUCOSE-CAPILLARY: 194 mg/dL — AB (ref 70–99)

## 2014-02-15 MED ORDER — INSULIN ASPART 100 UNIT/ML ~~LOC~~ SOLN
8.0000 [IU] | Freq: Three times a day (TID) | SUBCUTANEOUS | Status: DC
  Administered 2014-02-15: 8 [IU] via SUBCUTANEOUS

## 2014-02-15 MED ORDER — INSULIN ASPART 100 UNIT/ML ~~LOC~~ SOLN: 10.0000 [IU] | Freq: Three times a day (TID) | SUBCUTANEOUS | Status: DC

## 2014-02-15 MED ORDER — INSULIN LISPRO 100 UNIT/ML ~~LOC~~ SOLN: 10.0000 [IU] | Freq: Three times a day (TID) | SUBCUTANEOUS | Status: DC

## 2014-02-15 NOTE — Progress Notes (Signed)
Patient evaluated for community based chronic disease management services with [facility] as a benefit of patient's [employer]. Spoke with patient at bedside to explain [facility] services.  Services have been accepted with verbal consent.  Patient admitted with diabetic ketoacidosis.  She expressed that she was checking her sugars at home but feels that her glucometer may not have been accurate.  She has been given a prescription for a new glucometer.  She expressed that she can obtain it without difficulty.  Her daughter Wenda Overland is her authorized contact (Work: [phone] or Mobile: [phone]).  Patient will receive a post discharge transition of care call and will be evaluated for monthly home visits for assessments and disease process education.  Left contact information and [facility] literature at bedside. Made Inpatient Case Manager aware that [facility] following. Of note, [facility] Care Management services does not replace or interfere with any services that are arranged by inpatient case management or social work.  For additional questions or referrals please contact Corliss Blacker BSN RN [facility] Liaison at [phone].

## 2014-02-15 NOTE — Progress Notes (Signed)
Notified Dr. Luan Pulling to get some clarification on the patients home regimen of insulin.  He stated to continue her Humolog except to increase the dose to 10 units.  Discussed with Benny in the pharmacy and he helped me correct the med rec sheet.  Discussed the discharge orders with the patient and she verbalized understanding via teachback method. The patient verbalized understanding and left the floor via w/c with staff and family in stable condition.

## 2014-02-15 NOTE — Telephone Encounter (Signed)
Pt is aware of OV on 6/25 at 10 with LSL and appt card mailed

## 2014-02-15 NOTE — Progress Notes (Signed)
She feels much better and wants to go home. She has no new complaints. She's not had any further nausea or vomiting. She is scheduled for followup GI appointment as an outpatient. I think she's ready for discharge and will discharge her home today

## 2014-02-16 NOTE — Discharge Summary (Signed)
Physician Discharge Summary  Patient ID: [name] MRN: [id number] DOB/AGE: [birth year] 65 y.o. Primary Care Physician:Richardo Popoff L, MD Admit date: 02/13/2014 Discharge date: 02/16/2014    Discharge Diagnoses:   Active Problems:   Nausea & vomiting   GERD (gastroesophageal reflux disease)   Abdominal pain, lower   Hyperglycemia   Uncontrolled diabetes mellitus   Gastroparesis   Dehydration     Medication List         acetaminophen 500 MG tablet  Commonly known as:  TYLENOL  Take 1,000 mg by mouth every 6 (six) hours as needed. Pain.     insulin glargine 100 UNIT/ML injection  Commonly known as:  LANTUS  Inject 35 Units into the skin 2 (two) times daily.     insulin lispro 100 UNIT/ML injection  Commonly known as:  HUMALOG  Inject 0.1 mLs (10 Units total) into the skin 3 (three) times daily before meals.     losartan 50 MG tablet  Commonly known as:  COZAAR  Take 50 mg by mouth daily.     metoCLOPramide 5 MG tablet  Commonly known as:  REGLAN  Take 1 tablet (5 mg total) by mouth 4 (four) times daily.     pantoprazole 40 MG tablet  Commonly known as:  PROTONIX  Take 40 mg by mouth daily.        Discharged Condition: Improved    Consults: None  Significant Diagnostic Studies: No results found.  Lab Results: Basic Metabolic Panel:  Recent Labs  02/14/14 0439  NA 136*  K 3.5*  CL 99  CO2 24  GLUCOSE 239*  BUN 13  CREATININE 0.80  CALCIUM 8.7   Liver Function Tests: No results found for this basename: AST, ALT, ALKPHOS, BILITOT, PROT, ALBUMIN,  in the last 72 hours   CBC:  Recent Labs  02/14/14 0439  WBC 6.9  HGB 11.7*  HCT 36.6  MCV 81.2  PLT 195    Recent Results (from the past 240 hour(s))  MRSA PCR SCREENING     Status: None   Collection Time    02/13/14  8:55 AM      Result Value Ref Range Status   MRSA by PCR NEGATIVE  NEGATIVE Final   Comment:            The GeneXpert MRSA Assay (FDA     approved for NASAL  specimens     only), is one component of a     comprehensive MRSA colonization     surveillance program. It is not     intended to diagnose MRSA     infection nor to guide or     monitor treatment for     MRSA infections.     Hospital Course: She was admitted with severe nausea and vomiting and been occurring for about 4 days. She has had previous similar episodes related to gastroparesis. She was treated with IV fluids was given Phenergan because Zofran was ineffective and within 24 hours or so her nausea and vomiting had stopped. Her blood sugar was monitored and her controlled. She was much improved at the time of discharge was able to take in a regular diet without nausea  Discharge Exam: Blood pressure 136/86, pulse 96, temperature 98.6 F (37 C), temperature source Oral, resp. rate 20, height 5\' 5"  (1.651 m), weight 95 kg (209 lb 7 oz), SpO2 96.00%. She is awake and alert. Chest is clear. Heart is regular.  Disposition: Home with discussed home health services  and she said she did not think she needed      Signed: Alonza Bogus   02/16/2014, 8:28 AM

## 2014-03-11 DIAGNOSIS — K3184 Gastroparesis: Secondary | ICD-10-CM | POA: Diagnosis not present

## 2014-03-11 DIAGNOSIS — IMO0001 Reserved for inherently not codable concepts without codable children: Secondary | ICD-10-CM | POA: Diagnosis not present

## 2014-03-11 DIAGNOSIS — I10 Essential (primary) hypertension: Secondary | ICD-10-CM | POA: Diagnosis not present

## 2014-03-14 ENCOUNTER — Encounter: Payer: Self-pay | Admitting: Gastroenterology

## 2014-03-14 ENCOUNTER — Ambulatory Visit (INDEPENDENT_AMBULATORY_CARE_PROVIDER_SITE_OTHER): Payer: Medicare Other | Admitting: Gastroenterology

## 2014-03-14 VITALS — BP 128/87 | HR 98 | Temp 97.5°F | Ht 65.0 in | Wt 213.0 lb

## 2014-03-14 DIAGNOSIS — K3184 Gastroparesis: Secondary | ICD-10-CM

## 2014-03-14 DIAGNOSIS — K7689 Other specified diseases of liver: Secondary | ICD-10-CM | POA: Diagnosis not present

## 2014-03-14 DIAGNOSIS — K219 Gastro-esophageal reflux disease without esophagitis: Secondary | ICD-10-CM

## 2014-03-14 DIAGNOSIS — K5733 Diverticulitis of large intestine without perforation or abscess with bleeding: Secondary | ICD-10-CM

## 2014-03-14 DIAGNOSIS — K760 Fatty (change of) liver, not elsewhere classified: Secondary | ICD-10-CM

## 2014-03-14 DIAGNOSIS — K5732 Diverticulitis of large intestine without perforation or abscess without bleeding: Secondary | ICD-10-CM | POA: Insufficient documentation

## 2014-03-14 MED ORDER — METRONIDAZOLE 500 MG PO TABS: 500.0000 mg | ORAL_TABLET | Freq: Three times a day (TID) | ORAL | Status: DC

## 2014-03-14 MED ORDER — CIPROFLOXACIN HCL 500 MG PO TABS
500.0000 mg | ORAL_TABLET | Freq: Two times a day (BID) | ORAL | Status: DC
Start: 2014-03-14 — End: 2014-03-25

## 2014-03-14 NOTE — Assessment & Plan Note (Signed)
Recent admission for intractable nausea and vomiting felt to be due to gastroparesis in the setting of uncontrolled diabetes. Doing well on oral Reglan without any noted side effects, risk previously discussed with patient. Recommend tight glycemic control. Patient reports that her PCP has adjusted her medications. Watch for side effects related to Reglan and stop immediately if noted.

## 2014-03-14 NOTE — Patient Instructions (Signed)
1. Start Cipro and Flagyl for diverticulitis. Complete 10 day course. 2. For next 24-48 hours, stay on a liquid diet.  3. Call if symptoms do not resolve or worsen. If you develop worsening severe pain, vomiting, fever, blood in the stools go to the ER. 4. Continue Linzess as needed to avoid constipation.  Diverticulitis Diverticulitis is inflammation or infection of small pouches in your colon that form when you have a condition called diverticulosis. The pouches in your colon are called diverticula. Your colon, or large intestine, is where water is absorbed and stool is formed. Complications of diverticulitis can include:  Bleeding.  Severe infection.  Severe pain.  Perforation of your colon.  Obstruction of your colon. CAUSES  Diverticulitis is caused by bacteria. Diverticulitis happens when stool becomes trapped in diverticula. This allows bacteria to grow in the diverticula, which can lead to inflammation and infection. RISK FACTORS People with diverticulosis are at risk for diverticulitis. Eating a diet that does not include enough fiber from fruits and vegetables may make diverticulitis more likely to develop. SYMPTOMS  Symptoms of diverticulitis may include:  Abdominal pain and tenderness. The pain is normally located on the left side of the abdomen, but may occur in other areas.  Fever and chills.  Bloating.  Cramping.  Nausea.  Vomiting.  Constipation.  Diarrhea.  Blood in your stool. DIAGNOSIS  Your health care Reeder Brisby will ask you about your medical history and do a physical exam. You may need to have tests done because many medical conditions can cause the same symptoms as diverticulitis. Tests may include:  Blood tests.  Urine tests.  Imaging tests of the abdomen, including X-rays and CT scans. When your condition is under control, your health care Sanda Dejoy may recommend that you have a colonoscopy. A colonoscopy can show how severe your diverticula  are and whether something else is causing your symptoms. TREATMENT  Most cases of diverticulitis are mild and can be treated at home. Treatment may include:  Taking over-the-counter pain medicines.  Following a clear liquid diet.  Taking antibiotic medicines by mouth for 7-10 days. More severe cases may be treated at a hospital. Treatment may include:  Not eating or drinking.  Taking prescription pain medicine.  Receiving antibiotic medicines through an IV tube.  Receiving fluids and nutrition through an IV tube.  Surgery. HOME CARE INSTRUCTIONS   Follow your health care Laraine Samet's instructions carefully.  Follow a full liquid diet or other diet as directed by your health care Carole Doner. After your symptoms improve, your health care Kiaja Shorty may tell you to change your diet. He or she may recommend you eat a high-fiber diet. Fruits and vegetables are good sources of fiber. Fiber makes it easier to pass stool.  Take fiber supplements or probiotics as directed by your health care Saraphina Lauderbaugh.  Only take medicines as directed by your health care Shykeem Resurreccion.  Keep all your follow-up appointments. SEEK MEDICAL CARE IF:   Your pain does not improve.  You have a hard time eating food.  Your bowel movements do not return to normal. SEEK IMMEDIATE MEDICAL CARE IF:   Your pain becomes worse.  Your symptoms do not get better.  Your symptoms suddenly get worse.  You have a fever.  You have repeated vomiting.  You have bloody or black, tarry stools. MAKE SURE YOU:   Understand these instructions.  Will watch your condition.  Will get help right away if you are not doing well or get worse. Document Released:  06/16/2005 Document Revised: 09/11/2013 Document Reviewed: 08/01/2013 [facility] Patient Information 2015 [place], [place]. This information is not intended to replace advice given to you by your health care Correna Meacham. Make sure you discuss any questions you have with your  health care Dhalia Zingaro.

## 2014-03-14 NOTE — Assessment & Plan Note (Signed)
Previous ultrasound showed fatty liver and questionable early cirrhosis. LFTs are normal. Likely NASH related to diabetes. To discuss further with Dr. Gala Romney regarding need for any further workup.  Instructions for fatty liver: Recommend 1-2# weight loss per week until ideal body weight through exercise & diet. Low fat/cholesterol diet.   Avoid sweets, sodas, fruit juices, sweetened beverages like tea, etc. Gradually increase exercise from 15 min daily up to 1 hr per day 5 days/week. Limit alcohol use.

## 2014-03-14 NOTE — Progress Notes (Signed)
cc'd to pcp 

## 2014-03-14 NOTE — Progress Notes (Signed)
Primary Care Physician: Alonza Bogus, MD  Primary Gastroenterologist:  Garfield Cornea, MD   Chief Complaint  Patient presents with  . Follow-up    from hospital  . Abdominal Pain    L side x 5 days    HPI: [name] is a 63 y.o. female here for hospital f/u. Hospitalized for acute onset N/V last month. symptoms quickly managed with IV Reglan. Required limited evaluation. She has been doing well on po reglan since discharge. No side effects noted. C/o five day h/o Left sided abdominal pain. Had good BM yesterday but no improvement with pain. No fever. No n/v. Appetite ok. No heartburn. No recent diarrhea. Recently had to take some Linzess after out of the hospital. Took once. Sugars between 88 and 200. No dysuria. H/o diverticulosis.  Current Outpatient Prescriptions  Medication Sig Dispense Refill  . acetaminophen (TYLENOL) 500 MG tablet Take 1,000 mg by mouth every 6 (six) hours as needed. Pain.      . insulin glargine (LANTUS) 100 UNIT/ML injection Inject 35 Units into the skin 2 (two) times daily.      . insulin lispro (HUMALOG) 100 UNIT/ML injection Inject 0.1 mLs (10 Units total) into the skin 3 (three) times daily before meals.  10 mL  11  . losartan (COZAAR) 50 MG tablet Take 50 mg by mouth daily.      . metoCLOPramide (REGLAN) 5 MG tablet Take 1 tablet (5 mg total) by mouth 4 (four) times daily.  120 tablet  5  . pantoprazole (PROTONIX) 40 MG tablet Take 40 mg by mouth daily.       No current facility-administered medications for this visit.    Allergies as of 03/14/2014  . (No Known Allergies)   Past Medical History  Diagnosis Date  . Diabetes mellitus   . Hypertension   . Arthritis   . TTP (thrombotic thrombocytopenic purpura)   . Erosive esophagitis   . Gastritis   . Anemia   . Thyroid disease   . Duodenal ulcer     Age 51  . Obese   . Short-term memory loss    Past Surgical History  Procedure Laterality Date  . Splenectomy, partial    .  Ectopic pregnancy surgery    . Esophagogastroduodenoscopy  04/20/2012    KGM:[id number] antral gastritis/Erosive reflux esophagitis  . Colonoscopy  08/29/2012    DGU:[id number] diverticulosis     ROS:  General: Negative for anorexia, weight loss, fever, chills. Some fatigue but improving. ENT: Negative for hoarseness, difficulty swallowing , nasal congestion. CV: Negative for chest pain, angina, palpitations, dyspnea on exertion, peripheral edema.  Respiratory: Negative for dyspnea at rest, dyspnea on exertion, cough, sputum, wheezing.  GI: See history of present illness. GU:  Negative for dysuria, hematuria, urinary incontinence, urinary frequency, nocturnal urination.  Endo: Negative for unusual weight change.    Physical Examination:   BP 128/87  Pulse 98  Temp(Src) 97.5 F (36.4 C) (Oral)  Ht 5\' 5"  (1.651 m)  Wt 213 lb (96.616 kg)  BMI 35.44 kg/m2  General: Well-nourished, well-developed in no acute distress.  Eyes: No icterus. Mouth: Oropharyngeal mucosa moist and pink , no lesions erythema or exudate. Lungs: Clear to auscultation bilaterally.  Heart: Regular rate and rhythm, no murmurs rubs or gallops.  Abdomen: Bowel sounds are normal, mild to moderate LLQ tenderness, nondistended, no hepatosplenomegaly or masses, no abdominal bruits or hernia , no rebound or guarding.   Extremities: No lower extremity edema. No  clubbing or deformities. Neuro: Alert and oriented x 4   Skin: Warm and dry, no jaundice.   Psych: Alert and cooperative, normal mood and affect.  Labs:  Lab Results  Component Value Date   WBC 6.9 02/14/2014   HGB 11.7* 02/14/2014   HCT 36.6 02/14/2014   MCV 81.2 02/14/2014   PLT 195 02/14/2014   Lab Results  Component Value Date   CREATININE 0.80 02/14/2014   BUN 13 02/14/2014   NA 136* 02/14/2014   K 3.5* 02/14/2014   CL 99 02/14/2014   CO2 24 02/14/2014   Lab Results  Component Value Date   ALT 17 02/13/2014   AST 15 02/13/2014   ALKPHOS 84 02/13/2014    BILITOT 0.7 02/13/2014   Lab Results  Component Value Date   HGBA1C 12.3* 02/13/2014     Imaging Studies: No results found.

## 2014-03-14 NOTE — Assessment & Plan Note (Signed)
Five-day history of left lower quadrant pain suspect acute diverticulitis. Treat empirically with Cipro and Flagyl. Clear liquid diet for 24-48 hours. She will call if symptoms worsen or do not resolve. Continually assess for management of constipation.

## 2014-03-14 NOTE — Assessment & Plan Note (Signed)
Continue PPI therapy. 

## 2014-03-23 ENCOUNTER — Emergency Department (HOSPITAL_COMMUNITY): Payer: Medicare Other

## 2014-03-23 ENCOUNTER — Encounter (HOSPITAL_COMMUNITY): Payer: Self-pay | Admitting: Emergency Medicine

## 2014-03-23 ENCOUNTER — Inpatient Hospital Stay (HOSPITAL_COMMUNITY)
Admission: EM | Admit: 2014-03-23 | Discharge: 2014-03-31 | DRG: 286 | Disposition: A | Discharge status: Home Health | Payer: Medicare Other | Attending: Internal Medicine | Admitting: Internal Medicine

## 2014-03-23 ENCOUNTER — Inpatient Hospital Stay (HOSPITAL_COMMUNITY): Payer: Medicare Other

## 2014-03-23 DIAGNOSIS — E872 Acidosis, unspecified: Secondary | ICD-10-CM | POA: Diagnosis present

## 2014-03-23 DIAGNOSIS — I110 Hypertensive heart disease with heart failure: Secondary | ICD-10-CM | POA: Diagnosis present

## 2014-03-23 DIAGNOSIS — I509 Heart failure, unspecified: Secondary | ICD-10-CM | POA: Diagnosis present

## 2014-03-23 DIAGNOSIS — K219 Gastro-esophageal reflux disease without esophagitis: Secondary | ICD-10-CM | POA: Diagnosis present

## 2014-03-23 DIAGNOSIS — Z6836 Body mass index (BMI) 36.0-36.9, adult: Secondary | ICD-10-CM | POA: Diagnosis not present

## 2014-03-23 DIAGNOSIS — D259 Leiomyoma of uterus, unspecified: Secondary | ICD-10-CM | POA: Diagnosis not present

## 2014-03-23 DIAGNOSIS — J189 Pneumonia, unspecified organism: Secondary | ICD-10-CM

## 2014-03-23 DIAGNOSIS — J9602 Acute respiratory failure with hypercapnia: Secondary | ICD-10-CM

## 2014-03-23 DIAGNOSIS — R570 Cardiogenic shock: Secondary | ICD-10-CM | POA: Diagnosis present

## 2014-03-23 DIAGNOSIS — I428 Other cardiomyopathies: Secondary | ICD-10-CM | POA: Diagnosis present

## 2014-03-23 DIAGNOSIS — E1149 Type 2 diabetes mellitus with other diabetic neurological complication: Secondary | ICD-10-CM | POA: Diagnosis present

## 2014-03-23 DIAGNOSIS — J9601 Acute respiratory failure with hypoxia: Secondary | ICD-10-CM

## 2014-03-23 DIAGNOSIS — K5732 Diverticulitis of large intestine without perforation or abscess without bleeding: Secondary | ICD-10-CM

## 2014-03-23 DIAGNOSIS — I5043 Acute on chronic combined systolic (congestive) and diastolic (congestive) heart failure: Secondary | ICD-10-CM | POA: Diagnosis present

## 2014-03-23 DIAGNOSIS — IMO0001 Reserved for inherently not codable concepts without codable children: Secondary | ICD-10-CM | POA: Diagnosis not present

## 2014-03-23 DIAGNOSIS — K5909 Other constipation: Secondary | ICD-10-CM

## 2014-03-23 DIAGNOSIS — K3184 Gastroparesis: Secondary | ICD-10-CM

## 2014-03-23 DIAGNOSIS — J9600 Acute respiratory failure, unspecified whether with hypoxia or hypercapnia: Secondary | ICD-10-CM | POA: Diagnosis present

## 2014-03-23 DIAGNOSIS — E1165 Type 2 diabetes mellitus with hyperglycemia: Secondary | ICD-10-CM | POA: Diagnosis not present

## 2014-03-23 DIAGNOSIS — K7689 Other specified diseases of liver: Secondary | ICD-10-CM | POA: Diagnosis present

## 2014-03-23 DIAGNOSIS — E1142 Type 2 diabetes mellitus with diabetic polyneuropathy: Secondary | ICD-10-CM | POA: Diagnosis present

## 2014-03-23 DIAGNOSIS — I059 Rheumatic mitral valve disease, unspecified: Secondary | ICD-10-CM | POA: Diagnosis present

## 2014-03-23 DIAGNOSIS — Z794 Long term (current) use of insulin: Secondary | ICD-10-CM

## 2014-03-23 DIAGNOSIS — Z87891 Personal history of nicotine dependence: Secondary | ICD-10-CM

## 2014-03-23 DIAGNOSIS — I5041 Acute combined systolic (congestive) and diastolic (congestive) heart failure: Secondary | ICD-10-CM | POA: Diagnosis not present

## 2014-03-23 DIAGNOSIS — E860 Dehydration: Secondary | ICD-10-CM

## 2014-03-23 DIAGNOSIS — E118 Type 2 diabetes mellitus with unspecified complications: Secondary | ICD-10-CM

## 2014-03-23 DIAGNOSIS — K760 Fatty (change of) liver, not elsewhere classified: Secondary | ICD-10-CM

## 2014-03-23 DIAGNOSIS — E669 Obesity, unspecified: Secondary | ICD-10-CM | POA: Diagnosis present

## 2014-03-23 DIAGNOSIS — I498 Other specified cardiac arrhythmias: Secondary | ICD-10-CM | POA: Diagnosis present

## 2014-03-23 DIAGNOSIS — I10 Essential (primary) hypertension: Secondary | ICD-10-CM | POA: Diagnosis not present

## 2014-03-23 DIAGNOSIS — E1140 Type 2 diabetes mellitus with diabetic neuropathy, unspecified: Secondary | ICD-10-CM

## 2014-03-23 DIAGNOSIS — J181 Lobar pneumonia, unspecified organism: Secondary | ICD-10-CM | POA: Diagnosis present

## 2014-03-23 DIAGNOSIS — R739 Hyperglycemia, unspecified: Secondary | ICD-10-CM

## 2014-03-23 DIAGNOSIS — I2510 Atherosclerotic heart disease of native coronary artery without angina pectoris: Secondary | ICD-10-CM | POA: Diagnosis present

## 2014-03-23 DIAGNOSIS — K5900 Constipation, unspecified: Secondary | ICD-10-CM | POA: Diagnosis present

## 2014-03-23 DIAGNOSIS — IMO0002 Reserved for concepts with insufficient information to code with codable children: Secondary | ICD-10-CM

## 2014-03-23 DIAGNOSIS — I5021 Acute systolic (congestive) heart failure: Secondary | ICD-10-CM | POA: Diagnosis not present

## 2014-03-23 DIAGNOSIS — Z79899 Other long term (current) drug therapy: Secondary | ICD-10-CM | POA: Diagnosis not present

## 2014-03-23 DIAGNOSIS — D518 Other vitamin B12 deficiency anemias: Secondary | ICD-10-CM | POA: Diagnosis present

## 2014-03-23 DIAGNOSIS — R000 Tachycardia, unspecified: Secondary | ICD-10-CM | POA: Diagnosis not present

## 2014-03-23 DIAGNOSIS — K5733 Diverticulitis of large intestine without perforation or abscess with bleeding: Secondary | ICD-10-CM

## 2014-03-23 DIAGNOSIS — I447 Left bundle-branch block, unspecified: Secondary | ICD-10-CM | POA: Diagnosis present

## 2014-03-23 DIAGNOSIS — I420 Dilated cardiomyopathy: Secondary | ICD-10-CM

## 2014-03-23 DIAGNOSIS — R0989 Other specified symptoms and signs involving the circulatory and respiratory systems: Secondary | ICD-10-CM | POA: Diagnosis present

## 2014-03-23 DIAGNOSIS — E876 Hypokalemia: Secondary | ICD-10-CM | POA: Diagnosis not present

## 2014-03-23 DIAGNOSIS — D649 Anemia, unspecified: Secondary | ICD-10-CM | POA: Diagnosis present

## 2014-03-23 DIAGNOSIS — J9690 Respiratory failure, unspecified, unspecified whether with hypoxia or hypercapnia: Secondary | ICD-10-CM | POA: Diagnosis present

## 2014-03-23 DIAGNOSIS — R0609 Other forms of dyspnea: Secondary | ICD-10-CM | POA: Diagnosis not present

## 2014-03-23 LAB — COMPREHENSIVE METABOLIC PANEL
ALBUMIN: 2.8 g/dL — AB (ref 3.5–5.2)
ALT: 18 U/L (ref 0–35)
AST: 25 U/L (ref 0–37)
Alkaline Phosphatase: 60 U/L (ref 39–117)
Anion gap: 15 (ref 5–15)
BUN: 12 mg/dL (ref 6–23)
CALCIUM: 7.9 mg/dL — AB (ref 8.4–10.5)
CO2: 18 mEq/L — ABNORMAL LOW (ref 19–32)
CREATININE: 0.91 mg/dL (ref 0.50–1.10)
Chloride: 104 mEq/L (ref 96–112)
GFR calc Af Amer: 75 mL/min — ABNORMAL LOW (ref >90)
GFR calc non Af Amer: 65 mL/min — ABNORMAL LOW (ref >90)
Glucose, Bld: 296 mg/dL — ABNORMAL HIGH (ref 70–99)
Potassium: 3.5 mEq/L — ABNORMAL LOW (ref 3.7–5.3)
Sodium: 137 mEq/L (ref 137–147)
Total Bilirubin: 0.3 mg/dL (ref 0.3–1.2)
Total Protein: 6.7 g/dL (ref 6.0–8.3)

## 2014-03-23 LAB — BLOOD GAS, ARTERIAL
ACID-BASE DEFICIT: 6.7 mmol/L — AB (ref 0.0–2.0)
Acid-base deficit: 12.2 mmol/L — ABNORMAL HIGH (ref 0.0–2.0)
BICARBONATE: 18.8 meq/L — AB (ref 20.0–24.0)
Bicarbonate: 19.2 mEq/L — ABNORMAL LOW (ref 20.0–24.0)
DRAWN BY: 22223
FIO2: 100 %
FIO2: 100 %
O2 Saturation: 81.1 %
O2 Saturation: 98.1 %
PCO2 ART: 95.5 mmHg — AB (ref 35.0–45.0)
PCO2 ART: 44.7 mmHg (ref 35.0–45.0)
PEEP/CPAP: 5 cmH2O
PH ART: 6.926 — AB (ref 7.350–7.450)
PO2 ART: 76.9 mmHg — AB (ref 80.0–100.0)
PO2 ART: 162 mmHg — AB (ref 80.0–100.0)
Patient temperature: 37
Patient temperature: 37
RATE: 15 resp/min
TCO2: 20.1 mmol/L (ref 0–100)
TCO2: 18 mmol/L (ref 0–100)
VT: 500 mL
pH, Arterial: 7.256 — ABNORMAL LOW (ref 7.350–7.450)

## 2014-03-23 LAB — URINALYSIS, ROUTINE W REFLEX MICROSCOPIC
BILIRUBIN URINE: NEGATIVE
Glucose, UA: 100 mg/dL — AB
HGB URINE DIPSTICK: NEGATIVE
KETONES UR: NEGATIVE mg/dL
Leukocytes, UA: NEGATIVE
Nitrite: NEGATIVE
PH: 5.5 (ref 5.0–8.0)
Protein, ur: 100 mg/dL — AB
Specific Gravity, Urine: 1.025 (ref 1.005–1.030)
Urobilinogen, UA: 0.2 mg/dL (ref 0.0–1.0)

## 2014-03-23 LAB — POCT I-STAT 3, ART BLOOD GAS (G3+)
Acid-base deficit: 3 mmol/L — ABNORMAL HIGH (ref 0.0–2.0)
Bicarbonate: 21.6 mEq/L (ref 20.0–24.0)
O2 Saturation: 94 %
PCO2 ART: 35.3 mmHg (ref 35.0–45.0)
PH ART: 7.393 (ref 7.350–7.450)
Patient temperature: 98.1
TCO2: 23 mmol/L (ref 0–100)
pO2, Arterial: 69 mmHg — ABNORMAL LOW (ref 80.0–100.0)

## 2014-03-23 LAB — CBG MONITORING, ED
GLUCOSE-CAPILLARY: 242 mg/dL — AB (ref 70–99)
Glucose-Capillary: 258 mg/dL — ABNORMAL HIGH (ref 70–99)

## 2014-03-23 LAB — TROPONIN I: Troponin I: <0.3 ng/mL (ref <0.30)

## 2014-03-23 LAB — CBC
HCT: 40.9 % (ref 36.0–46.0)
Hemoglobin: 13.1 g/dL (ref 12.0–15.0)
MCH: 27.4 pg (ref 26.0–34.0)
MCHC: 32 g/dL (ref 30.0–36.0)
MCV: 85.6 fL (ref 78.0–100.0)
Platelets: 241 10*3/uL (ref 150–400)
RBC: 4.78 MIL/uL (ref 3.87–5.11)
RDW: 16.4 % — ABNORMAL HIGH (ref 11.5–15.5)
WBC: 14.2 10*3/uL — AB (ref 4.0–10.5)

## 2014-03-23 LAB — LACTIC ACID, PLASMA
LACTIC ACID, VENOUS: 1.4 mmol/L (ref 0.5–2.2)
Lactic Acid, Venous: 3.4 mmol/L — ABNORMAL HIGH (ref 0.5–2.2)

## 2014-03-23 LAB — BASIC METABOLIC PANEL
Anion gap: 14 (ref 5–15)
BUN: 11 mg/dL (ref 6–23)
CHLORIDE: 103 meq/L (ref 96–112)
CO2: 21 meq/L (ref 19–32)
Calcium: 7.9 mg/dL — ABNORMAL LOW (ref 8.4–10.5)
Creatinine, Ser: 0.73 mg/dL (ref 0.50–1.10)
GFR calc non Af Amer: 88 mL/min — ABNORMAL LOW (ref >90)
Glucose, Bld: 202 mg/dL — ABNORMAL HIGH (ref 70–99)
POTASSIUM: 4 meq/L (ref 3.7–5.3)
SODIUM: 138 meq/L (ref 137–147)

## 2014-03-23 LAB — RAPID URINE DRUG SCREEN, HOSP PERFORMED
AMPHETAMINES: NOT DETECTED
Barbiturates: NOT DETECTED
Benzodiazepines: NOT DETECTED
Cocaine: NOT DETECTED
Opiates: NOT DETECTED
TETRAHYDROCANNABINOL: NOT DETECTED

## 2014-03-23 LAB — PROTIME-INR
INR: 1.21 (ref 0.00–1.49)
PROTHROMBIN TIME: 15.3 s — AB (ref 11.6–15.2)

## 2014-03-23 LAB — CARBOXYHEMOGLOBIN
CARBOXYHEMOGLOBIN: 1 % (ref 0.5–1.5)
METHEMOGLOBIN: 1.2 % (ref 0.0–1.5)
O2 Saturation: 66.2 %
Total hemoglobin: 11.2 g/dL — ABNORMAL LOW (ref 12.0–16.0)

## 2014-03-23 LAB — PROCALCITONIN: Procalcitonin: <0.1 ng/mL

## 2014-03-23 LAB — GLUCOSE, CAPILLARY
GLUCOSE-CAPILLARY: 189 mg/dL — AB (ref 70–99)
GLUCOSE-CAPILLARY: 102 mg/dL — AB (ref 70–99)
Glucose-Capillary: 201 mg/dL — ABNORMAL HIGH (ref 70–99)
Glucose-Capillary: 147 mg/dL — ABNORMAL HIGH (ref 70–99)

## 2014-03-23 LAB — PRO B NATRIURETIC PEPTIDE: Pro B Natriuretic peptide (BNP): 1031 pg/mL — ABNORMAL HIGH (ref 0–125)

## 2014-03-23 LAB — URINE MICROSCOPIC-ADD ON

## 2014-03-23 LAB — D-DIMER, QUANTITATIVE: D-Dimer, Quant: 2.91 ug/mL-FEU — ABNORMAL HIGH (ref 0.00–0.48)

## 2014-03-23 LAB — MRSA PCR SCREENING: MRSA BY PCR: NEGATIVE

## 2014-03-23 LAB — ETHANOL

## 2014-03-23 MED ORDER — ALBUTEROL SULFATE (2.5 MG/3ML) 0.083% IN NEBU: 2.5000 mg | INHALATION_SOLUTION | RESPIRATORY_TRACT | Status: DC | PRN

## 2014-03-23 MED ORDER — FENTANYL CITRATE 0.05 MG/ML IJ SOLN
INTRAMUSCULAR | Status: AC
Start: 2014-03-23 — End: 2014-03-23
  Filled 2014-03-23: qty 50

## 2014-03-23 MED ORDER — LIDOCAINE HCL (CARDIAC) 20 MG/ML IV SOLN
INTRAVENOUS | Status: AC
  Filled 2014-03-23: qty 5

## 2014-03-23 MED ORDER — SODIUM CHLORIDE 0.9 % IV SOLN
INTRAVENOUS | Status: DC
  Administered 2014-03-23: 19:00:00 via INTRAVENOUS
  Administered 2014-03-25: 10 mL/h via INTRAVENOUS
  Administered 2014-03-28: 06:00:00 via INTRAVENOUS

## 2014-03-23 MED ORDER — CHLORHEXIDINE GLUCONATE 0.12 % MT SOLN
15.0000 mL | Freq: Two times a day (BID) | OROMUCOSAL | Status: DC
  Administered 2014-03-23 – 2014-03-25 (×5): 15 mL via OROMUCOSAL
  Filled 2014-03-23 (×5): qty 15

## 2014-03-23 MED ORDER — SUCCINYLCHOLINE CHLORIDE 20 MG/ML IJ SOLN
INTRAMUSCULAR | Status: AC
  Administered 2014-03-23: 125 mg via INTRAVENOUS
  Filled 2014-03-23: qty 1

## 2014-03-23 MED ORDER — PIPERACILLIN-TAZOBACTAM 3.375 G IVPB
3.3750 g | Freq: Once | INTRAVENOUS | Status: AC
  Administered 2014-03-23: 3.375 g via INTRAVENOUS
  Filled 2014-03-23: qty 50

## 2014-03-23 MED ORDER — POTASSIUM CHLORIDE 20 MEQ/15ML (10%) PO LIQD
40.0000 meq | Freq: Once | ORAL | Status: AC
  Administered 2014-03-23: 40 meq
  Filled 2014-03-23: qty 30

## 2014-03-23 MED ORDER — NOREPINEPHRINE BITARTRATE 1 MG/ML IV SOLN
INTRAVENOUS | Status: AC
  Filled 2014-03-23: qty 4

## 2014-03-23 MED ORDER — LORAZEPAM 2 MG/ML IJ SOLN
4.0000 mg | Freq: Once | INTRAMUSCULAR | Status: AC
  Administered 2014-03-23: 4 mg via INTRAVENOUS

## 2014-03-23 MED ORDER — DEXTROSE 5 % IV SOLN
500.0000 mg | INTRAVENOUS | Status: DC
  Administered 2014-03-23 – 2014-03-25 (×3): 500 mg via INTRAVENOUS
  Filled 2014-03-23 (×3): qty 500

## 2014-03-23 MED ORDER — BIOTENE DRY MOUTH MT LIQD
15.0000 mL | Freq: Four times a day (QID) | OROMUCOSAL | Status: DC
  Administered 2014-03-23 – 2014-03-25 (×7): 15 mL via OROMUCOSAL

## 2014-03-23 MED ORDER — ETOMIDATE 2 MG/ML IV SOLN
INTRAVENOUS | Status: AC
  Administered 2014-03-23: 20 mg via INTRAVENOUS
  Filled 2014-03-23: qty 20

## 2014-03-23 MED ORDER — SODIUM CHLORIDE 0.9 % IV SOLN
0.0000 ug/h | INTRAVENOUS | Status: DC
  Administered 2014-03-23: 75 ug/h via INTRAVENOUS
  Filled 2014-03-23 (×2): qty 50

## 2014-03-23 MED ORDER — PROPOFOL 10 MG/ML IV EMUL
INTRAVENOUS | Status: AC
  Administered 2014-03-23: 03:00:00 via INTRAVENOUS
  Filled 2014-03-23: qty 100

## 2014-03-23 MED ORDER — LORAZEPAM 2 MG/ML IJ SOLN
INTRAMUSCULAR | Status: AC
  Administered 2014-03-23: 4 mg via INTRAVENOUS
  Filled 2014-03-23: qty 2

## 2014-03-23 MED ORDER — FENTANYL BOLUS VIA INFUSION
25.0000 ug | INTRAVENOUS | Status: DC | PRN
  Filled 2014-03-23: qty 50

## 2014-03-23 MED ORDER — VANCOMYCIN HCL IN DEXTROSE 1-5 GM/200ML-% IV SOLN
1000.0000 mg | Freq: Once | INTRAVENOUS | Status: AC
  Administered 2014-03-23: 1000 mg via INTRAVENOUS
  Filled 2014-03-23: qty 200

## 2014-03-23 MED ORDER — DEXTROSE 5 % IV SOLN
2.0000 g | INTRAVENOUS | Status: DC
  Administered 2014-03-23 – 2014-03-25 (×3): 2 g via INTRAVENOUS
  Filled 2014-03-23 (×3): qty 2

## 2014-03-23 MED ORDER — NOREPINEPHRINE BITARTRATE 1 MG/ML IV SOLN: 0.1000 ug/kg/min | Freq: Once | INTRAVENOUS | Status: DC

## 2014-03-23 MED ORDER — SODIUM CHLORIDE 0.9 % IV SOLN
Freq: Once | INTRAVENOUS | Status: AC
Start: 2014-03-23 — End: 2014-03-23
  Administered 2014-03-23 (×2): 1000 mL via INTRAVENOUS

## 2014-03-23 MED ORDER — FENTANYL CITRATE 0.05 MG/ML IJ SOLN
50.0000 ug | Freq: Once | INTRAMUSCULAR | Status: DC
Start: 2014-03-23 — End: 2014-03-25

## 2014-03-23 MED ORDER — INSULIN ASPART 100 UNIT/ML ~~LOC~~ SOLN
2.0000 [IU] | SUBCUTANEOUS | Status: DC
  Administered 2014-03-23: 2 [IU] via SUBCUTANEOUS
  Administered 2014-03-23 (×2): 4 [IU] via SUBCUTANEOUS
  Administered 2014-03-24 – 2014-03-25 (×5): 2 [IU] via SUBCUTANEOUS

## 2014-03-23 MED ORDER — ENOXAPARIN SODIUM 40 MG/0.4ML ~~LOC~~ SOLN
40.0000 mg | SUBCUTANEOUS | Status: DC
  Administered 2014-03-23 – 2014-03-27 (×5): 40 mg via SUBCUTANEOUS
  Filled 2014-03-23 (×6): qty 0.4

## 2014-03-23 MED ORDER — VANCOMYCIN HCL IN DEXTROSE 1-5 GM/200ML-% IV SOLN
1000.0000 mg | Freq: Two times a day (BID) | INTRAVENOUS | Status: DC
  Administered 2014-03-23 – 2014-03-24 (×2): 1000 mg via INTRAVENOUS
  Filled 2014-03-23 (×3): qty 200

## 2014-03-23 MED ORDER — PHENYLEPHRINE HCL 10 MG/ML IJ SOLN
30.0000 ug/min | INTRAVENOUS | Status: DC
  Filled 2014-03-23: qty 1

## 2014-03-23 MED ORDER — PANTOPRAZOLE SODIUM 40 MG IV SOLR
40.0000 mg | Freq: Every day | INTRAVENOUS | Status: DC
  Administered 2014-03-23 – 2014-03-25 (×3): 40 mg via INTRAVENOUS
  Filled 2014-03-23 (×3): qty 40

## 2014-03-23 MED ORDER — PHENYLEPHRINE HCL 10 MG/ML IJ SOLN
INTRAMUSCULAR | Status: AC
  Filled 2014-03-23: qty 1

## 2014-03-23 MED ORDER — ROCURONIUM BROMIDE 50 MG/5ML IV SOLN
INTRAVENOUS | Status: AC
  Filled 2014-03-23: qty 2

## 2014-03-23 MED ORDER — FUROSEMIDE 10 MG/ML IJ SOLN
40.0000 mg | Freq: Four times a day (QID) | INTRAMUSCULAR | Status: AC
  Administered 2014-03-23 (×2): 40 mg via INTRAVENOUS
  Filled 2014-03-23 (×2): qty 4

## 2014-03-23 MED ORDER — SODIUM CHLORIDE 0.9 % IV SOLN: Freq: Once | INTRAVENOUS | Status: DC

## 2014-03-23 MED ORDER — NOREPINEPHRINE BITARTRATE 1 MG/ML IV SOLN
2.0000 ug/min | INTRAVENOUS | Status: DC
  Administered 2014-03-23: 5 ug/min via INTRAVENOUS
  Filled 2014-03-23: qty 4

## 2014-03-23 NOTE — Progress Notes (Signed)
INITIAL NUTRITION ASSESSMENT  DOCUMENTATION CODES Per approved criteria  -Obesity Unspecified   INTERVENTION: - If pt unable to be extubated in the next 24-48 hours, recommend TF initiation via OGT utilizing 3M PEPuP Protocol: initiate TF via Vital High Protein at 25 ml/h and Prostat 30 ml BID on day 1; on day 2, increase to goal rate of 50 ml/h (1200 ml per day) to provide 1400 kcals, 135 gm protein, 1003 ml free water daily. - TF at goal rate will meet 83% estimated calorie needs (22kcal/kg ideal body weight) and 106% estimated protein needs - If IVF d/c, recommend 180ml water flushes 4 times/day - RD to monitor plan of care    NUTRITION DIAGNOSIS: Inadequate oral intake related to inability to eat as evidenced by NPO.   Goal: Enteral nutrition to provide 60-70% of estimated calorie needs (22-25 kcals/kg ideal body weight) and 100% of estimated protein needs, based on ASPEN guidelines for permissive underfeeding in critically ill obese individuals  Monitor:  Weights, labs, vent status, TF initiation   Reason for Assessment: Ventilated pt   63 y.o. female  Admitting Dx: Acute respiratory failure  ASSESSMENT: Pt presented to ED from home with severe respiratory distress. Hx of DM, HTN, erosive esophagitis, gastritis, thyroid disease, and duodenal ulcer. Intubated today.    Patient is currently intubated on ventilator support MV: 8 L/min Temp (24hrs), Avg:98.1 F (36.7 C), Min:98.1 F (36.7 C), Max:98.1 F (36.7 C)  Propofol: off  Potassium low, getting replaced    Height: Ht Readings from Last 1 Encounters:  03/23/14 5\' 8"  (1.727 m)    Weight: Wt Readings from Last 1 Encounters:  03/23/14 222 lb 3.6 oz (100.8 kg)    Ideal Body Weight: 140 lbs   % Ideal Body Weight: 158%  Wt Readings from Last 10 Encounters:  03/23/14 222 lb 3.6 oz (100.8 kg)  03/14/14 213 lb (96.616 kg)  02/14/14 209 lb 7 oz (95 kg)  04/28/13 213 lb (96.616 kg)  11/30/12 213 lb 3.2 oz  (96.707 kg)  08/29/12 210 lb (95.255 kg)  08/29/12 210 lb (95.255 kg)  08/14/12 210 lb (95.255 kg)  07/09/12 215 lb 6.2 oz (97.7 kg)  04/19/12 225 lb 1.4 oz (102.1 kg)    Usual Body Weight: 213 lbs June 2015  % Usual Body Weight: 104%  BMI:  Body mass index is 33.8 kg/(m^2). Class I obesity   Estimated Nutritional Needs: Kcal: 1683 Protein: 127g  Fluid: per MD  Skin: Intact  Diet Order: NPO  EDUCATION NEEDS: -No education needs identified at this time   Intake/Output Summary (Last 24 hours) at 03/23/14 1020 Last data filed at 03/23/14 0500  Gross per 24 hour  Intake   1900 ml  Output    200 ml  Net   1700 ml    Last BM: PTA  Labs:   Recent Labs Lab 03/23/14 0316  NA 137  K 3.5*  CL 104  CO2 18*  BUN 12  CREATININE 0.91  CALCIUM 7.9*  GLUCOSE 296*    CBG (last 3)   Recent Labs  03/23/14 0244 03/23/14 0536 03/23/14 0746  GLUCAP 258* 242* 201*    Scheduled Meds: . antiseptic oral rinse  15 mL Mouth Rinse QID  . azithromycin  500 mg Intravenous Q24H  . cefTRIAXone (ROCEPHIN)  IV  2 g Intravenous Q24H  . chlorhexidine  15 mL Mouth Rinse BID  . enoxaparin (LOVENOX) injection  40 mg Subcutaneous Q24H  . fentaNYL  50  mcg Intravenous Once  . furosemide  40 mg Intravenous Q6H  . insulin aspart  2-6 Units Subcutaneous 6 times per day  . lidocaine (cardiac) 100 mg/68ml      . pantoprazole (PROTONIX) IV  40 mg Intravenous Daily  . rocuronium      . vancomycin  1,000 mg Intravenous Q12H    Continuous Infusions: . fentaNYL infusion INTRAVENOUS    . norepinephrine (LEVOPHED) Adult infusion 5 mcg/min (03/23/14 0818)    Past Medical History  Diagnosis Date  . Diabetes mellitus   . Hypertension   . Arthritis   . TTP (thrombotic thrombocytopenic purpura)   . Erosive esophagitis   . Gastritis   . Anemia   . Thyroid disease   . Duodenal ulcer     Age 63  . Obese   . Short-term memory loss     Past Surgical History  Procedure Laterality Date   . Splenectomy, partial    . Ectopic pregnancy surgery    . Esophagogastroduodenoscopy  04/20/2012    KPT:WSFKCLE antral gastritis/Erosive reflux esophagitis  . Colonoscopy  08/29/2012    XNT:ZGYFVCB diverticulosis    Carlis Stable MS, RD, LDN [phone] Weekend/After Hours Pager

## 2014-03-23 NOTE — H&P (Addendum)
PULMONARY  / CRITICAL CARE MEDICINE  Name: [name] MRN: [id number] DOB: 09/01/1951 PCP HAWKINS,EDWARD L, MD   ADMISSION DATE:  03/23/2014 LOS 0 days   CHIEF COMPLAINT:  Respiratory failure  BRIEF PATIENT DESCRIPTION:  63 y/o woman with obesity, poorly controlled DM2, HTN and TTP transferred from [facility] on 7/4 for respiratory failure and presumed CHF.   LINES / TUBES: 7/4 RIJ CVL  CULTURES: Bcx 7/4 x 2 at [facility]  ANTIBIOTICS:    SIGNIFICANT EVENTS / STUDIES:  7/4 Intubated at [facility] 7/4 RIJ CVL 7/4 Bedside echo: EF ~25%   HISTORY OF PRESENT ILLNESS:   63 y/o woman with obesity, poorly controlled DM2, HTN and TTP transferred from [facility] on 7/4 for respiratory failure and presumed CHF.   Recently admitted to [facility] with intractable n/v felt secondary to DM2 gastroparesis. No apparent h/o CAD or CHF.   Daughter reported several day h/o SOB and cough. Last night woke up very SOB. EMS called. Brought to [facility]. WBC 14.2 CXR with CHF and small effusions. Initial ECG with WCT at 150 (?VT vs SVT with aberrancy). Patient became less responsive and hypotensive and intubated in ER. Started on propofol and BP worse so started on levophed - titrated up to 30. On arrival SBP ~100. Also given 2L IVF at [facility]. Started on vancomycin and zosyn.  I did bedside echo EF ~25-30% RV ok   Co-ox on levophed 30 is 66%   PAST MEDICAL HISTORY :  Past Medical History  Diagnosis Date  . Diabetes mellitus   . Hypertension   . Arthritis   . TTP (thrombotic thrombocytopenic purpura)   . Erosive esophagitis   . Gastritis   . Anemia   . Thyroid disease   . Duodenal ulcer     Age 18  . Obese   . Short-term memory loss      Family History  Problem Relation Age of Onset  . Heart failure Mother   . Cirrhosis Father     ETOH  . Colon cancer Neg Hx   . Diabetes Daughter      History   Social History  . Marital Status: Divorced    Spouse Name: N/A    Number of Children: 3  . Years of  Education: N/A   Occupational History  . disabled    Social History Main Topics  . Smoking status: Former Smoker -- 0.25 packs/day for 10 years    Types: Cigarettes  . Smokeless tobacco: Not on file  . Alcohol Use: No  . Drug Use: No  . Sexual Activity: Not on file   Other Topics Concern  . Not on file   Social History Narrative   Lives w/ daughter, [name]           No Known Allergies    (Not in an outpatient encounter)     REVIEW OF SYSTEMS:  Not available as patient intubated. No family available.   SUBJECTIVE:   VITAL SIGNS: Filed Vitals:   03/23/14 0440 03/23/14 0450 03/23/14 0500 03/23/14 0620  BP: 77/54 100/72 97/67   Pulse: 106 107 105   Resp: 15 15 15 18   SpO2: 100% 100% 100%       HEMODYNAMICS:   VENTILATOR SETTINGS: Vent Mode:  [-] PRVC FiO2 (%):  [30 %] 30 % Set Rate:  [28 bmp] 28 bmp Vt Set:  [420 mL] 420 mL PEEP:  [5 cmH20] 5 cmH20 Plateau Pressure:  [4 IHK74-25 cmH20] 20 cmH20 INTAKE / OUTPUT:  I/O last 3 completed shifts: In: 1900 [I.V.:1900] Out: 200 [Urine:200]     PHYSICAL EXAMINATION: General: obese woman  Intubated sedated HEENT: normal x for ET tube Neck: supple. RIJ CVL hard to see JVP. Carotids 2+ bilat; no bruits. No lymphadenopathy or thryomegaly appreciated. Cor: PMI nondisplaced. Regular rate & rhythm. No rubs, gallops or murmurs. Lungs: clear Abdomen: soft, nontender, nondistended. No hepatosplenomegaly. No bruits or masses. Good bowel sounds. Extremities: no cyanosis, clubbing, rash, edema Neuro: alert & orientedx3, cranial nerves grossly intact. moves all 4 extremities w/o difficulty. Affect pleasant   LABS: PULMONARY  Recent Labs Lab 03/23/14 0255 03/23/14 0359 03/23/14 0651  PHART 6.926* 7.256*  --   PCO2ART 95.5* 44.7  --   PO2ART 76.9* 162.0*  --   HCO3 18.8* 19.2*  --   TCO2 20.1 18.0  --   O2SAT 81.1 98.1 66.2    CBC  Recent Labs Lab 03/23/14 0257  HGB 13.1  HCT 40.9  WBC 14.2*  PLT  241    COAGULATION  Recent Labs Lab 03/23/14 0257  INR 1.21    CARDIAC   Recent Labs Lab 03/23/14 0257  TROPONINI <0.30    Recent Labs Lab 03/23/14 0257  PROBNP 1031.0*     CHEMISTRY  Recent Labs Lab 03/23/14 0316  NA 137  K 3.5*  CL 104  CO2 18*  GLUCOSE 296*  BUN 12  CREATININE 0.91  CALCIUM 7.9*   The CrCl is unknown because both a height and weight (above a minimum accepted value) are required for this calculation.   LIVER  Recent Labs Lab 03/23/14 0257 03/23/14 0316  AST  --  25  ALT  --  18  ALKPHOS  --  60  BILITOT  --  0.3  PROT  --  6.7  ALBUMIN  --  2.8*  INR 1.21  --      INFECTIOUS  Recent Labs Lab 03/23/14 0345  LATICACIDVEN 3.4*     ENDOCRINE CBG (last 3)   Recent Labs  03/23/14 0244 03/23/14 0536  GLUCAP 258* 242*    IMAGING x48h  Dg Chest Portable 1 View  03/23/2014   CLINICAL DATA:  Respiratory distress  EXAM: PORTABLE CHEST - 1 VIEW  COMPARISON:  08/14/2012  FINDINGS: An endotracheal tube is in place, tip 2 cm above the carina.  There is mild cardiomegaly. Diffuse interstitial coarsening and possible small pleural effusions. Symmetric perihilar density is likely alveolar edema. No pneumothorax.  IMPRESSION: 1. Endotracheal tube ends 2 cm above the carina. 2. CHF.   Electronically Signed   By: Jorje Guild M.D.   On: 03/23/2014 03:31    ASSESSMENT / PLAN:  PULMONARY A: 1) Acute respiratory failure P:   I suspect this is due to CHF however does have leukocytosis. No reports of fever or chills. EF 25% by echo. Co-ox preserved on levophed. Will attempt diuresis. Get formal echo. Wean levo and vent as tolerated.   CARDIOVASCULAR A:  1) Acute systolic HF - flash pulmonary edema      2) Wide complex tachycardia - ?SVT with aberrancy vs VT     P:  Will order formal echo. If EF low will likely need cath. Wean levophed as tolerated. Follow CVP and co-ox. Cycle troponin. Start ASA. ACE/b-blocker once BP stable.  Keep K. 4.0 Mg > 2.0.   In looking back at older ECGs patient with LBBB in remote past. Suspect initial ECG with sinus tach or AFL with LBBB and not VT  RENAL A:  1) Hypokalemia P:   Supp K.   GASTROINTESTINAL A:  1) h/o diabetic gastroparesis P:   Reglan as needed   HEMATOLOGIC A:  1) h/o TTP  P:  Platelets ok. Follow  INFECTIOUS A:  1) Leukocytosis P:   WBC up but no clear infectious symptoms. Bcx sent. Will hold off on abx for now.   ENDOCRINE A:  1) DM2 P:   Insulin and SSI   GLOBAL A  DVT and stress ulcer prophylaxis ordered.    The patient is critically ill with multiple organ systems failure and requires high complexity decision making for assessment and support, frequent evaluation and titration of therapies, application of advanced monitoring technologies and extensive interpretation of multiple databases.   Critical Care Time devoted to patient care services described in this note is 40  Minutes.   [place] [facility] 03/23/2014 7:03 AM

## 2014-03-23 NOTE — Procedures (Addendum)
Central Venous Catheter Insertion Procedure Note [name] [id number] December 08, 1950  Procedure: Insertion of Central Venous Catheter Indications: Assessment of intravascular volume  Procedure Details Consent: Unable to obtain consent because of emergent medical necessity. Time Out: Verified patient identification, verified procedure, site/side was marked, verified correct patient position, special equipment/implants available, medications/allergies/relevent history reviewed, required imaging and test results available.  Performed  Maximum sterile technique was used including antiseptics, cap, gloves, gown, hand hygiene, mask and sheet. Skin prep: Chlorhexidine; local anesthetic administered A antimicrobial bonded/coated triple lumen catheter was placed in the right internal jugular vein using the Seldinger technique.  Evaluation Blood flow good Complications: No apparent complications Patient did tolerate procedure well. Chest X-ray ordered to verify placement.  CXR: pending.  Christina Bickers MD 03/23/2014, 7:02 AM

## 2014-03-23 NOTE — Progress Notes (Signed)
[facility] P[facility]  Sign out received from overnight PCCM coverage.  Admitted this AM from [facility] with acute hypoxemic respiratory failure.  Had CXR suggestive of CHF, bedside echo shows LVEF 25-30%.  Had several days of cough, has leukocytosis, low grade temp.  On exam:  Filed Vitals:   03/23/14 0440 03/23/14 0450 03/23/14 0500 03/23/14 0620  BP: 77/54 100/72 97/67   Pulse: 106 107 105   Resp: 15 15 15 18   Height:    5\' 8"  (1.727 m)  Weight:    100.8 kg (222 lb 3.6 oz)  SpO2: 100% 100% 100%   CVP 13  Gen: sedated on vent HEENT: NCAT, ETT PULM: rhonchi, crackles bilaterally CV: RRR, S1/S2 AB: BS+, soft, nontender Ext: no edema, cool Neuro: sedated on vent  CXR : bilateral pulm edema suggestive of CHF  Impression: 1) Shock> most likely cardiogenic 2) CAP? 3) Acute respiratory failure 4) DM2  Agree this is most likely CHF alone, but leukocytosis, secretions from ETT, cough and low grade temp at least worrisome enough for severe community acquired pneumonia that I will cover with vanc/ceftriaxone/azithro for now.  Admission orders, sedation, vent, antibiotics, and lasix x2 written.  Hold sepsis protocol as appears to be mostly CHF.  Additional CC time by me 48 minutes.  Roselie Awkward, MD [place] [facility] Pager: [phone] Cell: [phone] If no response, call [phone]

## 2014-03-23 NOTE — ED Notes (Signed)
Restless, propofol increased

## 2014-03-23 NOTE — Progress Notes (Signed)
ANTIBIOTIC CONSULT NOTE - INITIAL  Pharmacy Consult for vanc Indication: pneumonia  No Known Allergies  Patient Measurements: Height: 5\' 8"  (172.7 cm) Weight: 222 lb 3.6 oz (100.8 kg) IBW/kg (Calculated) : 63.9 Adjusted Body Weight:   Vital Signs: BP: 97/67 mmHg (07/04 0500) Pulse Rate: 105 (07/04 0500) Intake/Output from previous day: 07/03 0701 - 07/04 0700 In: 1900 [I.V.:1900] Out: 200 [Urine:200] Intake/Output from this shift:    Labs:  Recent Labs  03/23/14 0257 03/23/14 0316  WBC 14.2*  --   HGB 13.1  --   PLT 241  --   CREATININE  --  0.91   Estimated Creatinine Clearance: 76.6 ml/min (by C-G formula based on Cr of 0.91). No results found for this basename: VANCOTROUGH, VANCOPEAK, VANCORANDOM, GENTTROUGH, GENTPEAK, GENTRANDOM, TOBRATROUGH, TOBRAPEAK, TOBRARND, AMIKACINPEAK, AMIKACINTROU, AMIKACIN,  in the last 72 hours   Microbiology: Recent Results (from the past 720 hour(s))  CULTURE, BLOOD (ROUTINE X 2)     Status: None   Collection Time    03/23/14  3:16 AM      Result Value Ref Range Status   Specimen Description BLOOD LEFT HAND DRAWN BY RN   Final   Special Requests     Final   Value: BOTTLES DRAWN AEROBIC AND ANAEROBIC 6CC EACH BOTTLE   Culture NO GROWTH <24 HRS   Final   Report Status PENDING   Incomplete  CULTURE, BLOOD (ROUTINE X 2)     Status: None   Collection Time    03/23/14  3:21 AM      Result Value Ref Range Status   Specimen Description BLOOD LEFT HAND   Final   Special Requests     Final   Value: BOTTLES DRAWN AEROBIC AND ANAEROBIC 6CC EACH BOTTLE   Culture NO GROWTH <24 HRS   Final   Report Status PENDING   Incomplete    Medical History: Past Medical History  Diagnosis Date  . Diabetes mellitus   . Hypertension   . Arthritis   . TTP (thrombotic thrombocytopenic purpura)   . Erosive esophagitis   . Gastritis   . Anemia   . Thyroid disease   . Duodenal ulcer     Age 63  . Obese   . Short-term memory loss      Medications:  Scheduled:  . antiseptic oral rinse  15 mL Mouth Rinse QID  . azithromycin  500 mg Intravenous Q24H  . cefTRIAXone (ROCEPHIN)  IV  2 g Intravenous Q24H  . chlorhexidine  15 mL Mouth Rinse BID  . enoxaparin (LOVENOX) injection  40 mg Subcutaneous Q24H  . fentaNYL  50 mcg Intravenous Once  . furosemide  40 mg Intravenous Q6H  . insulin aspart  2-6 Units Subcutaneous 6 times per day  . lidocaine (cardiac) 100 mg/71ml      . pantoprazole (PROTONIX) IV  40 mg Intravenous Daily  . potassium chloride  40 mEq Per Tube Once  . rocuronium       Infusions:  . fentaNYL infusion INTRAVENOUS    . norepinephrine (LEVOPHED) Adult infusion     Assessment: 63 yo who was tx from [facility] for resp failure. She has hx CHF so this also could due to this or PNA per MD. She had received a dose of vanc at [facility]. Empiric with abx for now.  Goal of Therapy:  Vancomycin trough level 15-20 mcg/ml  Plan:   Vanc 1g IV q12 F/u with level as needed  Onnie Boer [place] 03/23/2014,7:47 AM

## 2014-03-23 NOTE — ED Provider Notes (Addendum)
CSN: [id number]     Arrival date & time 03/23/14  0232 History   First MD Initiated Contact with Patient 03/23/14 [phone]     Chief Complaint  Patient presents with  . Respiratory Distress    Level V caveat secondary to acute illness and urgent need for intervention (Consider location/radiation/quality/duration/timing/severity/associated sxs/prior Treatment) HPI 63 year old female who presented to ED from home with severe respiratory distress. History is obtained from the patient's daughter. The patient is in respiratory distress and unable to give any history. Her daughter states that she has been coughing during the day but appeared to be generally at her baseline. After she had been asleep for several hours she will go into the bathroom. She then told her daughter that she was unable to breathe. The daughter brought her to the emergency department by private vehicle. Patient has become less or sponsored during transport. She is unable to answer any questions for me. Her daughter states she does not know whether she was having any pain or running a fever. She was a smoker in the past but has not smoked for approximately 8 years. She has no known lung disease per her daughter. She has no history of heart disease per her daughter. Past Medical History  Diagnosis Date  . Diabetes mellitus   . Hypertension   . Arthritis   . TTP (thrombotic thrombocytopenic purpura)   . Erosive esophagitis   . Gastritis   . Anemia   . Thyroid disease   . Duodenal ulcer     Age 89  . Obese   . Short-term memory loss    Past Surgical History  Procedure Laterality Date  . Splenectomy, partial    . Ectopic pregnancy surgery    . Esophagogastroduodenoscopy  04/20/2012    VEH:[id number] antral gastritis/Erosive reflux esophagitis  . Colonoscopy  08/29/2012    GGE:ZMOQHUT diverticulosis   Family History  Problem Relation Age of Onset  . Heart failure Mother   . Cirrhosis Father     ETOH  . Colon cancer Neg Hx    . Diabetes Daughter    History  Substance Use Topics  . Smoking status: Former Smoker -- 0.25 packs/day for 10 years    Types: Cigarettes  . Smokeless tobacco: Not on file  . Alcohol Use: No   OB History   Grav Para Term Preterm Abortions TAB SAB Ect Mult Living                 Review of Systems  Unable to perform ROS     Allergies  Review of patient's allergies indicates no known allergies.  Home Medications   Prior to Admission medications   Medication Sig Start Date End Date Taking? Authorizing Provider  acetaminophen (TYLENOL) 500 MG tablet Take 1,000 mg by mouth every 6 (six) hours as needed. Pain.    Historical Provider, MD  ciprofloxacin (CIPRO) 500 MG tablet Take 1 tablet (500 mg total) by mouth 2 (two) times daily. 03/14/14   Mahala Menghini, PA-C  insulin glargine (LANTUS) 100 UNIT/ML injection Inject 35 Units into the skin 2 (two) times daily.    Historical Provider, MD  insulin lispro (HUMALOG) 100 UNIT/ML injection Inject 0.1 mLs (10 Units total) into the skin 3 (three) times daily before meals. 02/15/14   Alonza Bogus, MD  losartan (COZAAR) 50 MG tablet Take 50 mg by mouth daily.    Historical Provider, MD  metoCLOPramide (REGLAN) 5 MG tablet Take 1 tablet (5 mg total)  by mouth 4 (four) times daily. 07/10/12   Alonza Bogus, MD  metroNIDAZOLE (FLAGYL) 500 MG tablet Take 1 tablet (500 mg total) by mouth 3 (three) times daily. 03/14/14   Mahala Menghini, PA-C  pantoprazole (PROTONIX) 40 MG tablet Take 40 mg by mouth daily.    Historical Provider, MD   BP 136/99  Pulse 152  Resp 27  SpO2 89% Physical Exam  Nursing note and vitals reviewed. Constitutional: She appears well-developed and well-nourished. She appears distressed.  Obese  HENT:  Head: Normocephalic.  Mouth/Throat: Oropharynx is clear and moist.  Eyes: Conjunctivae are normal. Pupils are equal, round, and reactive to light.  Neck: Normal range of motion. Neck supple.  Cardiovascular: Intact  distal pulses.  Tachycardia present.   Dorsal pedalis pulses are 1+ radial pulses are 1+  Pulmonary/Chest: She is in respiratory distress. She has no wheezes. She has no rales.  Breath sounds are decreased diffusely increased work of breathing is noted  Abdominal: Soft. Bowel sounds are normal. There is no tenderness.  Musculoskeletal: She exhibits no edema.  Neurological:  Patient with eye opening to verbal stimuli but does not follow commands she does not appear to have any lateralized weakness or deficits. She is moving all 4 extremities at times  Skin:  Skin is cool and dry and somewhat clammy    ED Course  INTUBATION Date/Time: 03/23/2014 3:12 AM Performed by: Shaune Pollack Authorized by: Shaune Pollack Consent: The procedure was performed in an emergent situation. Indications: respiratory distress Intubation method: fiberoptic oral Patient status: paralyzed (RSI) Preoxygenation: nonrebreather mask (Nasal cannula also in place) Sedatives: etomidate Paralytic: succinylcholine Tube size: 8.0 mm Tube type: cuffed Number of attempts: 1 Cricoid pressure: yes Cords visualized: yes Post-procedure assessment: ETCO2 monitor Breath sounds: equal Cuff inflated: yes ETT to lip: 20 cm Tube secured with: ETT holder Chest x-Jaedon Siler interpreted by me and radiologist. Chest x-Valincia Touch findings: endotracheal tube in appropriate position Patient tolerance: Patient tolerated the procedure well with no immediate complications.   (including critical care time) Labs Review Labs Reviewed  CBG MONITORING, ED - Abnormal; Notable for the following:    Glucose-Capillary 258 (*)    All other components within normal limits  CBC  BASIC METABOLIC PANEL  PRO B NATRIURETIC PEPTIDE  TROPONIN I  PROTIME-INR  LACTIC ACID, PLASMA  D-DIMER, QUANTITATIVE  ETHANOL  URINALYSIS, ROUTINE W REFLEX MICROSCOPIC  URINE RAPID DRUG SCREEN (HOSP PERFORMED)    Imaging Review No results found.   EKG  Interpretation   Date/Time:  Saturday March 23 2014 02:42:30 EDT Ventricular Rate:  150 PR Interval:  113 QRS Duration: 138 QT Interval:  367 QTC Calculation: 580 R Axis:   6 Text Interpretation:  Sinus tachycardia with QRS widening Confirmed by Cyle Kenyon  MD, Andee Poles ([id number]) on 03/23/2014 3:14:08 AM        Patient increasingly dyspneic and less responsive. Decision made to intubate prior to laboratory or radiographic assessment. Patient's sats in the mid-80s on nonrebreather. Nasal cannula was added.  Patient with respiratory acidosis on abg performed priot to intubation. Plan to recheck abg now. 3:50 AM Patient with abg 7.25/44/162 on initial vent settings.   BP at 92/77 and hr decreased to 107.  Discussed care with Dr. Jimmy Footman and plan transfer to [facility] to care of intensivist.  Patient with respiratory failure.  CXR most consistent with chf but cannot rule out infectious etiology- wbc elevated, patient with recent cough and asymmetrical markings.  Patient has had zosyn  and vanc ordered to cover for hap.  Although cxr also c.w. chf patient has had borderline hypotension since intubation and is not being diuresed at this time.  Positive pressure ventilation appears to have improved her respiratory status and further laboratory evaluation are pending.     4:41 AM Patient with worsening hypotension.  Plan norepi drip.  MDM   Final diagnoses:  Acute respiratory failure with hypercapnia     [facility] at bedside and requesting change in sedation to fentanyl and versed.  Plan versed 4mg  iv and reassess bp.  Patient currently with bp 100/70, hr 70s and appears well sedated.  Given current bp will hold norepi unless she becomes hypotensive.  Agree with cutting back diprivan but cautiously to ensure ongiong sedation given benzos could also contribute.  Patient currently has had 175 cc uop after 1500 ns infused.  Additional 1500 cc being given.   Now with bp 82/63.  after versed.  Plan  continue with norepi drip.   [facility] reports discussing with Dr. Jimmy Footman and plans change to neosynephrine instead of norepi.  Also, vent changes done per pccm.  They are awaiting new drip before transfer.     Bp 85/66 awaiting neosynephrine drip.  Patient continued well sedated through transfer to [facility] stretcher.  5:27 AM   CRITICAL CARE Performed by: Shaune Pollack Total critical care time: 65 Critical care time was exclusive of separately billable procedures and treating other patients. Critical care was necessary to treat or prevent imminent or life-threatening deterioration. Critical care was time spent personally by me on the following activities: development of treatment plan with patient and/or surrogate as well as nursing, discussions with consultants, evaluation of patient's response to treatment, examination of patient, obtaining history from patient or surrogate, ordering and performing treatments and interventions, ordering and review of laboratory studies, ordering and review of radiographic studies, pulse oximetry and re-evaluation of patient's condition.  Shaune Pollack, MD 03/23/14 [place], MD 04/01/14 [phone]

## 2014-03-23 NOTE — ED Notes (Signed)
Daughter states that pt has been coughing for 2 days, tonight got up to bathroom and when she returned states "I can't breath" daughter drove pt here.

## 2014-03-24 ENCOUNTER — Inpatient Hospital Stay (HOSPITAL_COMMUNITY): Payer: Medicare Other

## 2014-03-24 DIAGNOSIS — R000 Tachycardia, unspecified: Secondary | ICD-10-CM | POA: Diagnosis not present

## 2014-03-24 DIAGNOSIS — K3184 Gastroparesis: Secondary | ICD-10-CM

## 2014-03-24 DIAGNOSIS — I509 Heart failure, unspecified: Secondary | ICD-10-CM | POA: Diagnosis not present

## 2014-03-24 DIAGNOSIS — IMO0001 Reserved for inherently not codable concepts without codable children: Secondary | ICD-10-CM

## 2014-03-24 DIAGNOSIS — I059 Rheumatic mitral valve disease, unspecified: Secondary | ICD-10-CM

## 2014-03-24 DIAGNOSIS — R0989 Other specified symptoms and signs involving the circulatory and respiratory systems: Secondary | ICD-10-CM | POA: Diagnosis not present

## 2014-03-24 DIAGNOSIS — I5021 Acute systolic (congestive) heart failure: Secondary | ICD-10-CM | POA: Diagnosis not present

## 2014-03-24 DIAGNOSIS — J9600 Acute respiratory failure, unspecified whether with hypoxia or hypercapnia: Secondary | ICD-10-CM | POA: Diagnosis not present

## 2014-03-24 DIAGNOSIS — E1165 Type 2 diabetes mellitus with hyperglycemia: Secondary | ICD-10-CM

## 2014-03-24 DIAGNOSIS — R570 Cardiogenic shock: Secondary | ICD-10-CM

## 2014-03-24 DIAGNOSIS — I5043 Acute on chronic combined systolic (congestive) and diastolic (congestive) heart failure: Secondary | ICD-10-CM | POA: Diagnosis not present

## 2014-03-24 DIAGNOSIS — I110 Hypertensive heart disease with heart failure: Secondary | ICD-10-CM | POA: Diagnosis not present

## 2014-03-24 DIAGNOSIS — J189 Pneumonia, unspecified organism: Secondary | ICD-10-CM

## 2014-03-24 LAB — CBC WITH DIFFERENTIAL/PLATELET
BASOS ABS: 0 10*3/uL (ref 0.0–0.1)
BASOS PCT: 0 % (ref 0–1)
Eosinophils Absolute: 0.1 10*3/uL (ref 0.0–0.7)
Eosinophils Relative: 1 % (ref 0–5)
HCT: 32.5 % — ABNORMAL LOW (ref 36.0–46.0)
HEMOGLOBIN: 10.5 g/dL — AB (ref 12.0–15.0)
Lymphocytes Relative: 12 % (ref 12–46)
Lymphs Abs: 1.3 10*3/uL (ref 0.7–4.0)
MCH: 26.4 pg (ref 26.0–34.0)
MCHC: 32.3 g/dL (ref 30.0–36.0)
MCV: 81.7 fL (ref 78.0–100.0)
MONOS PCT: 9 % (ref 3–12)
Monocytes Absolute: 1.1 10*3/uL — ABNORMAL HIGH (ref 0.1–1.0)
NEUTROS ABS: 9.2 10*3/uL — AB (ref 1.7–7.7)
NEUTROS PCT: 79 % — AB (ref 43–77)
Platelets: 202 10*3/uL (ref 150–400)
RBC: 3.98 MIL/uL (ref 3.87–5.11)
RDW: 15.9 % — ABNORMAL HIGH (ref 11.5–15.5)
WBC: 11.7 10*3/uL — ABNORMAL HIGH (ref 4.0–10.5)

## 2014-03-24 LAB — GLUCOSE, CAPILLARY
GLUCOSE-CAPILLARY: 123 mg/dL — AB (ref 70–99)
Glucose-Capillary: 140 mg/dL — ABNORMAL HIGH (ref 70–99)
Glucose-Capillary: 97 mg/dL (ref 70–99)
Glucose-Capillary: 136 mg/dL — ABNORMAL HIGH (ref 70–99)
Glucose-Capillary: 126 mg/dL — ABNORMAL HIGH (ref 70–99)
Glucose-Capillary: 97 mg/dL (ref 70–99)

## 2014-03-24 LAB — CBC
HEMATOCRIT: 32.4 % — AB (ref 36.0–46.0)
HEMOGLOBIN: 10.4 g/dL — AB (ref 12.0–15.0)
MCH: 26.2 pg (ref 26.0–34.0)
MCHC: 32.1 g/dL (ref 30.0–36.0)
MCV: 81.6 fL (ref 78.0–100.0)
Platelets: 204 10*3/uL (ref 150–400)
RBC: 3.97 MIL/uL (ref 3.87–5.11)
RDW: 15.8 % — AB (ref 11.5–15.5)
WBC: 9.8 10*3/uL (ref 4.0–10.5)

## 2014-03-24 LAB — TROPONIN I
Troponin I: <0.3 ng/mL (ref <0.30)
Troponin I: <0.3 ng/mL (ref <0.30)

## 2014-03-24 LAB — BASIC METABOLIC PANEL
ANION GAP: 14 (ref 5–15)
Anion gap: 16 — ABNORMAL HIGH (ref 5–15)
BUN: 8 mg/dL (ref 6–23)
BUN: 7 mg/dL (ref 6–23)
CALCIUM: 8.1 mg/dL — AB (ref 8.4–10.5)
CHLORIDE: 101 meq/L (ref 96–112)
CHLORIDE: 100 meq/L (ref 96–112)
CO2: 23 meq/L (ref 19–32)
CO2: 21 mEq/L (ref 19–32)
CREATININE: 0.64 mg/dL (ref 0.50–1.10)
Calcium: 8.2 mg/dL — ABNORMAL LOW (ref 8.4–10.5)
Creatinine, Ser: 0.61 mg/dL (ref 0.50–1.10)
GFR calc non Af Amer: >90 mL/min (ref >90)
GFR calc non Af Amer: >90 mL/min (ref >90)
Glucose, Bld: 125 mg/dL — ABNORMAL HIGH (ref 70–99)
Glucose, Bld: 112 mg/dL — ABNORMAL HIGH (ref 70–99)
POTASSIUM: 3.8 meq/L (ref 3.7–5.3)
Potassium: 3.4 mEq/L — ABNORMAL LOW (ref 3.7–5.3)
Sodium: 138 mEq/L (ref 137–147)
Sodium: 137 mEq/L (ref 137–147)

## 2014-03-24 LAB — CARBOXYHEMOGLOBIN
CARBOXYHEMOGLOBIN: 1 % (ref 0.5–1.5)
METHEMOGLOBIN: 1.2 % (ref 0.0–1.5)
O2 SAT: 68.5 %
Total hemoglobin: 11 g/dL — ABNORMAL LOW (ref 12.0–16.0)

## 2014-03-24 LAB — PROCALCITONIN: Procalcitonin: 0.26 ng/mL

## 2014-03-24 LAB — TSH: TSH: 2 u[IU]/mL (ref 0.350–4.500)

## 2014-03-24 LAB — MAGNESIUM: MAGNESIUM: 1.8 mg/dL (ref 1.5–2.5)

## 2014-03-24 MED ORDER — FUROSEMIDE 10 MG/ML IJ SOLN: 40.0000 mg | Freq: Once | INTRAMUSCULAR | Status: DC

## 2014-03-24 MED ORDER — LOSARTAN POTASSIUM 50 MG PO TABS
50.0000 mg | ORAL_TABLET | Freq: Every day | ORAL | Status: DC
  Filled 2014-03-24: qty 1

## 2014-03-24 MED ORDER — POTASSIUM CHLORIDE 10 MEQ/50ML IV SOLN
10.0000 meq | INTRAVENOUS | Status: AC
  Administered 2014-03-24 (×2): 10 meq via INTRAVENOUS
  Filled 2014-03-24 (×2): qty 50

## 2014-03-24 MED ORDER — POTASSIUM CHLORIDE 10 MEQ/50ML IV SOLN
10.0000 meq | INTRAVENOUS | Status: AC
  Administered 2014-03-24 (×4): 10 meq via INTRAVENOUS
  Filled 2014-03-24 (×2): qty 50

## 2014-03-24 MED ORDER — FUROSEMIDE 10 MG/ML IJ SOLN
40.0000 mg | Freq: Four times a day (QID) | INTRAMUSCULAR | Status: AC
  Administered 2014-03-24 – 2014-03-25 (×3): 40 mg via INTRAVENOUS
  Filled 2014-03-24 (×3): qty 4

## 2014-03-24 NOTE — Progress Notes (Signed)
Wasted 220 ml of fentanyl gtt bag in sink. Witnessed by Girard Cooter.

## 2014-03-24 NOTE — Progress Notes (Signed)
PULMONARY  / CRITICAL CARE MEDICINE  Name: [name] MRN: [id number] DOB: 03/26/1951 PCP HAWKINS,EDWARD L, MD   ADMISSION DATE:  03/23/2014 LOS 1 days   CHIEF COMPLAINT:  Respiratory failure  BRIEF PATIENT DESCRIPTION:  63 y/o woman with obesity, poorly controlled DM2, HTN and TTP transferred from [facility] on 7/4 for respiratory failure and presumed CHF.   LINES / TUBES: 7/4 RIJ CVL  CULTURES: Bcx 7/4 x 2 at [facility]  ANTIBIOTICS: vanc 7/4 > 7/5 Ceftriaxone 7/4 >  Azithr 7/4 >    SIGNIFICANT EVENTS / STUDIES:  7/4 Intubated at [facility] 7/4 RIJ CVL 7/4 TTE > LVEF 25%, mild focal LVH of septum, grade 3 diastolic dysfunction, mild MR, LAE   SUBJECTIVE: NO acute events, diuresed last night  VITAL SIGNS: Filed Vitals:   03/24/14 0700 03/24/14 0800 03/24/14 0847 03/24/14 0900  BP: 95/61 97/61  113/73  Pulse: 102 104 108 111  Temp:      TempSrc:      Resp: 14 14 12 20   Height:      Weight:      SpO2: 99% 99% 99% 99%      HEMODYNAMICS:   VENTILATOR SETTINGS: Vent Mode:  [-] PRVC FiO2 (%):  [30 %] 30 % Set Rate:  [28 bmp] 28 bmp Vt Set:  [420 mL] 420 mL PEEP:  [5 cmH20] 5 cmH20 Plateau Pressure:  [4 cmH20-21 cmH20] 20 cmH20 INTAKE / OUTPUT: I/O last 3 completed shifts: In: 3035.1 [I.V.:2535.1; IV Piggyback:500] Out: 4235 [Urine:1685]     PHYSICAL EXAMINATION: General: awake on vent, comfortable HEENT: NCAT, PERRL, ETT PULM : crackles in bases CV: Tachy, regular, S1/S2 AB: BS+, soft, nontender, no hsm Ext: warm, trace edema Neuro: Awake, following commands on vent   LABS: PULMONARY  Recent Labs Lab 03/23/14 0255 03/23/14 0359 03/23/14 0651 03/23/14 0908  PHART 6.926* 7.256*  --  7.393  PCO2ART 95.5* 44.7  --  35.3  PO2ART 76.9* 162.0*  --  69.0*  HCO3 18.8* 19.2*  --  21.6  TCO2 20.1 18.0  --  23  O2SAT 81.1 98.1 66.2 94.0    CBC  Recent Labs Lab 03/23/14 0257 03/24/14 0300  HGB 13.1 10.4*  HCT 40.9 32.4*  WBC 14.2* 9.8  PLT 241 204     COAGULATION  Recent Labs Lab 03/23/14 0257  INR 1.21    CARDIAC    Recent Labs Lab 03/23/14 0257  TROPONINI <0.30    Recent Labs Lab 03/23/14 0257  PROBNP 1031.0*     CHEMISTRY  Recent Labs Lab 03/23/14 0316 03/23/14 1000 03/24/14 0300  NA 137 138 138  K 3.5* 4.0 3.4*  CL 104 103 101  CO2 18* 21 23  GLUCOSE 296* 202* 125*  BUN 12 11 8   CREATININE 0.91 0.73 0.61  CALCIUM 7.9* 7.9* 8.1*   Estimated Creatinine Clearance: 87.1 ml/min (by C-G formula based on Cr of 0.61).   LIVER  Recent Labs Lab 03/23/14 0257 03/23/14 0316  AST  --  25  ALT  --  18  ALKPHOS  --  60  BILITOT  --  0.3  PROT  --  6.7  ALBUMIN  --  2.8*  INR 1.21  --      INFECTIOUS  Recent Labs Lab 03/23/14 0345 03/23/14 0654 03/23/14 1000 03/24/14 0300  LATICACIDVEN 3.4*  --  1.4  --   PROCALCITON  --  <0.10  --  0.26     ENDOCRINE CBG (last 3)  Recent Labs  03/24/14 0019 03/24/14 0358 03/24/14 0722  GLUCAP 123* 140* 97    CXR:   ASSESSMENT / PLAN:  PULMONARY A: Acute respiratory failure due to Acute decompensated systolic heart failure P:   -extubate this morning -npo 4 hours, then advance diet -diurese today -IS/OOB as tolerated  CARDIOVASCULAR A:  Acute systolic HF - flash pulmonary edema improving but etiology uncertain (tachycardia mediated?, ischemic?)      Wide complex tachycardia - ?SVT with aberrancy vs VT      P:  -Daily 12 lead -Repeat coox -lasix today again x3 doses -resume home losartan -cardiology consult today > cath, b-blocker?   RENAL A:  Hypokalemia P:   -Supp K and monitor  GASTROINTESTINAL A:  H/O diabetic gastroparesis P:   -Reglan as needed  HEMATOLOGIC A:  History of TTP, no evidence now P:  -monitor CBC  INFECTIOUS A:  Leukocytosis, no clinical evidence of infection, doubt CAP P:   -stop vanc -stop all antibiotics 7/6 if clinically stable -f/u cultures  ENDOCRINE A:  DM2 P:   -Insulin and SSI    GLOBAL A  DVT and stress ulcer prophylaxis ordered.   CC time by me 40 minutes   Roselie Awkward, MD [place] PCCM Pager: [phone] Cell: [phone] If no response, call [phone]

## 2014-03-24 NOTE — Progress Notes (Signed)
2D Echocardiogram has been performed.  Doyle Askew 03/24/2014, 8:29 AM

## 2014-03-24 NOTE — Procedures (Signed)
Extubation Procedure Note  Patient Details:   Name: Tiahna Cure DOB: April 03, 1951 MRN: [id number]   Airway Documentation:     Evaluation  O2 sats: stable throughout Complications: No apparent complications Patient did tolerate procedure well. Bilateral Breath Sounds: Clear;Diminished Suctioning: Oral Yes  Pt extubated per MD order.  Pt placed on 4LNC with sats of 97%.  RN at bedside.  RT will continue to monitor.  Closson, Deetta Perla 03/24/2014, 11:43 AM

## 2014-03-24 NOTE — Consult Note (Signed)
Consulting cardiologist: Dr [name] MD  Clinical Summary [name] is a 63 y.o.female hx of DM2, obesity, TTP and no prior cardiac history admitted with SOB and cough to [facility]. She became progressively hypotensive and hypoxic, and was intubated in the ER. On propofol bp's initially worsened and she was started on levophed and fluid resuscitated, and started on emperic antibiotics. Her initial EKG showed a wide complex regular tachycardia rate 150 most consistent with SVT with aberrancy. This occurred in the setting of severe metabolic abnormalities including pH of 6.9.   CXR with pulm edema, small effusions.  Initial ABG 6.93/95.5/77 D-dimer 2.91, trop negative x1, BNP 1031, WBC 14.2, Hgb 13.1, K 3.5, Cr 0.91, UDS neg, lactic acid 3.4, venous gas central line PO2 66%, 68% Echo LVEF 25-30%, akinesis of anteroseptal wall, restrictive diastolic function.    No Known Allergies  Medications Scheduled Medications: . antiseptic oral rinse  15 mL Mouth Rinse QID  . azithromycin  500 mg Intravenous Q24H  . cefTRIAXone (ROCEPHIN)  IV  2 g Intravenous Q24H  . chlorhexidine  15 mL Mouth Rinse BID  . enoxaparin (LOVENOX) injection  40 mg Subcutaneous Q24H  . fentaNYL  50 mcg Intravenous Once  . furosemide  40 mg Intravenous Q6H  . insulin aspart  2-6 Units Subcutaneous 6 times per day  . losartan  50 mg Oral Daily  . pantoprazole (PROTONIX) IV  40 mg Intravenous Daily  . potassium chloride  10 mEq Intravenous Q1 Hr x 4     Infusions: . sodium chloride 20 mL/hr at 03/24/14 0600  . fentaNYL infusion INTRAVENOUS 75 mcg/hr (03/24/14 0600)     PRN Medications:  albuterol, fentaNYL   Past Medical History  Diagnosis Date  . Diabetes mellitus   . Hypertension   . Arthritis   . TTP (thrombotic thrombocytopenic purpura)   . Erosive esophagitis   . Gastritis   . Anemia   . Thyroid disease   . Duodenal ulcer     Age 60  . Obese   . Short-term memory loss     Past  Surgical History  Procedure Laterality Date  . Splenectomy, partial    . Ectopic pregnancy surgery    . Esophagogastroduodenoscopy  04/20/2012    QIO:[id number] antral gastritis/Erosive reflux esophagitis  . Colonoscopy  08/29/2012    UXL:[id number] diverticulosis    Family History  Problem Relation Age of Onset  . Heart failure Mother   . Cirrhosis Father     ETOH  . Colon cancer Neg Hx   . Diabetes Daughter     Social History [name] reports that she has quit smoking. Her smoking use included Cigarettes. She has a 2.5 pack-year smoking history. She does not have any smokeless tobacco history on file. Ms. Mikes reports that she does not drink alcohol.  Review of Systems CONSTITUTIONAL: No weight loss, fever, chills, weakness or fatigue.  HEENT: Eyes: No visual loss, blurred vision, double vision or yellow sclerae. No hearing loss, sneezing, congestion, runny nose or sore throat.  SKIN: No rash or itching.  CARDIOVASCULAR: No chest pain RESPIRATORY:+ SOB GASTROINTESTINAL: No anorexia, nausea, vomiting or diarrhea. No abdominal pain or blood.  GENITOURINARY: no polyuria, no dysuria NEUROLOGICAL: No headache, dizziness, syncope, paralysis, ataxia, numbness or tingling in the extremities. No change in bowel or bladder control.  MUSCULOSKELETAL: No muscle, back pain, joint pain or stiffness.  HEMATOLOGIC: No anemia, bleeding or bruising.  LYMPHATICS: No enlarged nodes. No history of  splenectomy.  PSYCHIATRIC: No history of depression or anxiety.      Physical Examination Blood pressure 96/68, pulse 114, temperature 98.7 F (37.1 C), temperature source Oral, resp. rate 23, height 5\' 8"  (1.727 m), weight 222 lb 3.6 oz (100.8 kg), SpO2 98.00%.  Intake/Output Summary (Last 24 hours) at 03/24/14 1315 Last data filed at 03/24/14 0600  Gross per 24 hour  Intake 716.86 ml  Output    510 ml  Net 206.86 ml    HEENT: sclera clear  Cardiovascular: Regular, rate 100, no m/r/g, no  JVD  Respiratory: crackles bilateral bases  GI: abdomen soft, NT, ND  MSK: no LE edema  Neuro: no focal deficits  Psych: appropriate affect   Lab Results  Basic Metabolic Panel:  Recent Labs Lab 03/23/14 0316 03/23/14 1000 03/24/14 0300  NA 137 138 138  K 3.5* 4.0 3.4*  CL 104 103 101  CO2 18* 21 23  GLUCOSE 296* 202* 125*  BUN 12 11 8   CREATININE 0.91 0.73 0.61  CALCIUM 7.9* 7.9* 8.1*    Liver Function Tests:  Recent Labs Lab 03/23/14 0316  AST 25  ALT 18  ALKPHOS 60  BILITOT 0.3  PROT 6.7  ALBUMIN 2.8*    CBC:  Recent Labs Lab 03/23/14 0257 03/24/14 0300  WBC 14.2* 9.8  HGB 13.1 10.4*  HCT 40.9 32.4*  MCV 85.6 81.6  PLT 241 204    Cardiac Enzymes:  Recent Labs Lab 03/23/14 0257  TROPONINI <0.30    BNP: No components found with this basename: POCBNP,    Imaging 03/24/14 Echo Study Conclusions  - Left ventricle: The cavity size was normal. There was mild focal basal hypertrophy of the septum. Systolic function was severely reduced. The estimated ejection fraction was in the range of 25% to 30%. There is akinesis of the entireanteroseptal myocardium. Doppler parameters are consistent with a reversible restrictive pattern, indicative of decreased left ventricular diastolic compliance and/or increased left atrial pressure (grade 3 diastolic dysfunction). - Mitral valve: There was mild regurgitation. - Left atrium: The atrium was moderately dilated.    Impression/Recommendations 1. Acute systolic heart failure - new diagnosis this admission, echo with LVEF 25-30% with restrictive diastolic function, anteroseptal akinesis. She has prior history of intermittent LBBB (May 27,2015 EKG).  - will continue IV diuresis, fairly soft blood pressures limiting medical therapy. Will stop losartan, once more euvolemic and bp stabilized start low dose beta blocker.  - she is off levophed, venous PO2 from line mid 60s goes against cardiogenic  etiology of hypotension.  - once more euvolemic and respiratory status improved she will need RHC/LHC this admission.   2. Wide complex tachycardia - appears most consistent with SVT with aberrancy in the setting of severe metbolic acidosis, hypoxemia, and systemic stress - there is no repeat 12 lead in system, will order. Tele shows sinus tach in 100s with LBBB. She has had evidence of intermittent LBBB before (Feb 13, 2014), perhaps she has rate related LBBB. This does not appear to be acute LBBB, I suspect most likely given her anteroseptal akinesis that she had a prior silent infarct in the past as opposed to acutely based on her presentation. Awaiting repeat troponins, initial set negative.    3. Leukocytosis - on abx per primary team, concern for possible pneumonia   [name], M.D., F.A.C.C.

## 2014-03-25 DIAGNOSIS — K3184 Gastroparesis: Secondary | ICD-10-CM | POA: Diagnosis not present

## 2014-03-25 DIAGNOSIS — R570 Cardiogenic shock: Secondary | ICD-10-CM | POA: Diagnosis not present

## 2014-03-25 DIAGNOSIS — J9600 Acute respiratory failure, unspecified whether with hypoxia or hypercapnia: Secondary | ICD-10-CM | POA: Diagnosis not present

## 2014-03-25 DIAGNOSIS — I509 Heart failure, unspecified: Secondary | ICD-10-CM | POA: Diagnosis not present

## 2014-03-25 DIAGNOSIS — I110 Hypertensive heart disease with heart failure: Secondary | ICD-10-CM | POA: Diagnosis not present

## 2014-03-25 DIAGNOSIS — I5021 Acute systolic (congestive) heart failure: Secondary | ICD-10-CM | POA: Diagnosis not present

## 2014-03-25 LAB — GLUCOSE, CAPILLARY
GLUCOSE-CAPILLARY: 104 mg/dL — AB (ref 70–99)
GLUCOSE-CAPILLARY: 111 mg/dL — AB (ref 70–99)
Glucose-Capillary: 140 mg/dL — ABNORMAL HIGH (ref 70–99)
Glucose-Capillary: 130 mg/dL — ABNORMAL HIGH (ref 70–99)
Glucose-Capillary: 146 mg/dL — ABNORMAL HIGH (ref 70–99)
Glucose-Capillary: 169 mg/dL — ABNORMAL HIGH (ref 70–99)

## 2014-03-25 LAB — BASIC METABOLIC PANEL
Anion gap: 14 (ref 5–15)
BUN: 6 mg/dL (ref 6–23)
CALCIUM: 8.4 mg/dL (ref 8.4–10.5)
CO2: 25 meq/L (ref 19–32)
CREATININE: 0.69 mg/dL (ref 0.50–1.10)
Chloride: 98 mEq/L (ref 96–112)
GFR calc Af Amer: >90 mL/min (ref >90)
GFR calc non Af Amer: 89 mL/min — ABNORMAL LOW (ref >90)
GLUCOSE: 111 mg/dL — AB (ref 70–99)
Potassium: 3.4 mEq/L — ABNORMAL LOW (ref 3.7–5.3)
Sodium: 137 mEq/L (ref 137–147)

## 2014-03-25 LAB — TROPONIN I

## 2014-03-25 LAB — PROCALCITONIN: PROCALCITONIN: 0.16 ng/mL

## 2014-03-25 LAB — T4, FREE: Free T4: 0.81 ng/dL (ref 0.80–1.80)

## 2014-03-25 MED ORDER — INSULIN ASPART 100 UNIT/ML ~~LOC~~ SOLN: 0.0000 [IU] | Freq: Every day | SUBCUTANEOUS | Status: DC

## 2014-03-25 MED ORDER — FUROSEMIDE 10 MG/ML IJ SOLN
40.0000 mg | Freq: Two times a day (BID) | INTRAMUSCULAR | Status: AC
  Administered 2014-03-25 – 2014-03-26 (×2): 40 mg via INTRAVENOUS
  Filled 2014-03-25 (×3): qty 4

## 2014-03-25 MED ORDER — FUROSEMIDE 10 MG/ML IJ SOLN
40.0000 mg | Freq: Two times a day (BID) | INTRAMUSCULAR | Status: DC
  Administered 2014-03-25: 40 mg via INTRAVENOUS

## 2014-03-25 MED ORDER — PANTOPRAZOLE SODIUM 40 MG PO TBEC
40.0000 mg | DELAYED_RELEASE_TABLET | Freq: Every day | ORAL | Status: DC
  Administered 2014-03-26 – 2014-03-30 (×5): 40 mg via ORAL
  Filled 2014-03-25 (×4): qty 1

## 2014-03-25 MED ORDER — POTASSIUM CHLORIDE 20 MEQ/15ML (10%) PO LIQD
ORAL | Status: AC
  Filled 2014-03-25: qty 30

## 2014-03-25 MED ORDER — FUROSEMIDE 10 MG/ML IJ SOLN: 40.0000 mg | Freq: Two times a day (BID) | INTRAMUSCULAR | Status: DC

## 2014-03-25 MED ORDER — ATORVASTATIN CALCIUM 80 MG PO TABS
80.0000 mg | ORAL_TABLET | Freq: Every day | ORAL | Status: DC
  Administered 2014-03-25 – 2014-03-30 (×6): 80 mg via ORAL
  Filled 2014-03-25 (×7): qty 1

## 2014-03-25 MED ORDER — INSULIN ASPART 100 UNIT/ML ~~LOC~~ SOLN
4.0000 [IU] | Freq: Three times a day (TID) | SUBCUTANEOUS | Status: DC
  Administered 2014-03-26 – 2014-03-30 (×13): 4 [IU] via SUBCUTANEOUS

## 2014-03-25 MED ORDER — INSULIN ASPART 100 UNIT/ML ~~LOC~~ SOLN
0.0000 [IU] | Freq: Three times a day (TID) | SUBCUTANEOUS | Status: DC
  Administered 2014-03-25 – 2014-03-26 (×3): 2 [IU] via SUBCUTANEOUS
  Administered 2014-03-26: 3 [IU] via SUBCUTANEOUS
  Administered 2014-03-27 – 2014-03-29 (×6): 2 [IU] via SUBCUTANEOUS
  Administered 2014-03-30 (×2): 5 [IU] via SUBCUTANEOUS
  Administered 2014-03-30 – 2014-03-31 (×2): 3 [IU] via SUBCUTANEOUS

## 2014-03-25 MED ORDER — POTASSIUM CHLORIDE 20 MEQ/15ML (10%) PO LIQD: 40.0000 meq | Freq: Once | ORAL | Status: DC

## 2014-03-25 MED ORDER — ASPIRIN 81 MG PO CHEW
81.0000 mg | CHEWABLE_TABLET | Freq: Every day | ORAL | Status: DC
  Administered 2014-03-25 – 2014-03-31 (×7): 81 mg via ORAL
  Filled 2014-03-25 (×7): qty 1

## 2014-03-25 MED ORDER — INSULIN GLARGINE 100 UNIT/ML ~~LOC~~ SOLN
20.0000 [IU] | Freq: Every day | SUBCUTANEOUS | Status: DC
  Administered 2014-03-25 – 2014-03-30 (×6): 20 [IU] via SUBCUTANEOUS
  Filled 2014-03-25 (×8): qty 0.2

## 2014-03-25 MED ORDER — POTASSIUM CHLORIDE CRYS ER 20 MEQ PO TBCR
40.0000 meq | EXTENDED_RELEASE_TABLET | Freq: Once | ORAL | Status: AC
  Administered 2014-03-25: 40 meq via ORAL
  Filled 2014-03-25: qty 2

## 2014-03-25 NOTE — Progress Notes (Signed)
Report called to 2H, Alyce Pagan. We decided together to leave foley in place until after the 6 pm lasix due to the fact the patient is very tired. The RN on 2900 will d/c foley after the pm dose of lasix.

## 2014-03-25 NOTE — Progress Notes (Signed)
PULMONARY  / CRITICAL CARE MEDICINE  Name: [name] MRN: [id number] DOB: 12-20-1950 PCP HAWKINS,EDWARD L, MD   ADMISSION DATE:  03/23/2014 LOS 2 days   CHIEF COMPLAINT:  Respiratory failure  BRIEF PATIENT DESCRIPTION:  63 y/o woman with obesity, poorly controlled DM2, HTN and TTP transferred from [facility] on 7/4 for respiratory failure and presumed CHF.   LINES / TUBES: ETT 7/04 >> 7/05 R IJ CVL 7/4 >> 7/06  CULTURES: Bcx 7/4 x 2 at [facility]  ANTIBIOTICS: vanc 7/4 > 7/5 Ceftriaxone 7/4 >> 7/06 Azithro 7/4 >> 7/06   SIGNIFICANT EVENTS / STUDIES:  7/4 Intubated at [facility] 7/4 RIJ CVL 7/4 TTE > LVEF 25%, mild focal LVH of septum, grade 3 diastolic dysfunction, mild MR, LAE   SUBJECTIVE:  No overt distress. No new complaints  VITAL SIGNS: Filed Vitals:   03/25/14 0600 03/25/14 0700 03/25/14 0800 03/25/14 0900  BP: 100/58 101/62 86/57 101/67  Pulse: 106 110 106 101  Temp:  99.2 F (37.3 C)    TempSrc:  Oral    Resp: 22 21 23 20   Height:      Weight: 96.3 kg (212 lb 4.9 oz)     SpO2: 97% 100% 98% 100%    INTAKE / OUTPUT: I/O last 3 completed shifts: In: 1434.3 [P.O.:150; I.V.:784.3; IV Piggyback:500] Out: 0737 [Urine:3245]    PHYSICAL EXAMINATION: General: NAD HEENT: NCAT, WNL PULM : bibasilar crackles CV: Tachy, regular, + gallop AB: BS+, soft, nontender, no hsm Ext: warm, trace edema Neuro: no focal deficits   LABS: I have reviewed all of today's lab results. Relevant abnormalities are discussed in the A/P section   CXR: NNF  ASSESSMENT / PLAN:  PULMONARY A: Acute respiratory failure Pulm edema P:   Cont supp O2 Transfer to SDU  CARDIOVASCULAR A:   Acute systolic HF Newly diagnosed cardiomyopathy  Sinus tachycardia IVCD    P:  Further eval and mgmt per Cards  RENAL A:   Hypokalemia Hypervolemia P:   Monitor BMET intermittently Monitor I/Os Correct electrolytes as indicated  GASTROINTESTINAL A:   Diabetic  gastroparesis Chronic PPI use P:   SUP: PO PPI Carb mod diet  HEMATOLOGIC A:  History of TTP, inactive P:  DVT px: LMWH Monitor CBC intermittently Transfuse per usual ICU guidelines  INFECTIOUS A:   Doubt CAP P:   Micro and abx as above  ENDOCRINE A:  DM2 P:   Begin Lantus at reduced dose Change SSI to ACHS  Transfer to SDU. TRH to assume care as of AM 7/07 and PCCM to sign off. Discussed with Dr Nicolette Bang, MD ; [facility] [phone].  After 5:30 PM or weekends, call [phone]

## 2014-03-25 NOTE — Progress Notes (Addendum)
    Subjective:  Denies CP or dyspnea   Objective:  Filed Vitals:   03/25/14 0600 03/25/14 0700 03/25/14 0800 03/25/14 0900  BP: 100/58 101/62 86/57 101/67  Pulse: 106 110 106 101  Temp:  99.2 F (37.3 C)    TempSrc:  Oral    Resp: 22 21 23 20   Height:      Weight: 212 lb 4.9 oz (96.3 kg)     SpO2: 97% 100% 98% 100%    Intake/Output from previous day:  Intake/Output Summary (Last 24 hours) at 03/25/14 1018 Last data filed at 03/25/14 1000  Gross per 24 hour  Intake   1080 ml  Output   2800 ml  Net  -1720 ml    Physical Exam: Physical exam: Well-developed well-nourished in no acute distress.  Skin is warm and dry.  HEENT is normal.  Neck is supple.  Chest diminished BS bases Cardiovascular exam is regular rate and rhythm.  Abdominal exam nontender or distended. No masses palpated. Extremities show no edema. neuro grossly intact    Lab Results: Basic Metabolic Panel:  Recent Labs  03/24/14 0300 03/24/14 1700 03/25/14 0120  NA 138 137 137  K 3.4* 3.8 3.4*  CL 101 100 98  CO2 23 21 25   GLUCOSE 125* 112* 111*  BUN 8 7 6   CREATININE 0.61 0.64 0.69  CALCIUM 8.1* 8.2* 8.4  MG  --  1.8  --    CBC:  Recent Labs  03/24/14 0300 03/24/14 1137  WBC 9.8 11.7*  NEUTROABS  --  9.2*  HGB 10.4* 10.5*  HCT 32.4* 32.5*  MCV 81.6 81.7  PLT 204 202   Cardiac Enzymes:  Recent Labs  03/24/14 1550 03/24/14 1928 03/25/14 0120  TROPONINI <0.30 <0.30 <0.30     Assessment/Plan:  1 Acute systolic CHF-patient's presentation seems most c/w CHF; remains volume overloaded; lasix 40 BID and follow renal function. No ACEI or beta blocker given borderline BP.  2 Cardiomyopathy-with wall motion abnormalities, probably ischemia mediated; add ASA and statin; will ultimately need R and L cath prior to DC (risks and benefits discussed and patient agrees to proceed; probably Wed). 3 ? Pneumonia-on antibiotics per CCM. Repeat chest xray in AM 4 DM-follow CBGs 5 TTP 6  WCT on admission-baseline LBBB; most likely SVT or sinus tach with baseline LBBB. 7 Hypokalemia-supplement  Kirk Ruths 03/25/2014, 10:18 AM

## 2014-03-26 ENCOUNTER — Inpatient Hospital Stay (HOSPITAL_COMMUNITY): Payer: Medicare Other

## 2014-03-26 ENCOUNTER — Encounter (HOSPITAL_COMMUNITY): Payer: Self-pay | Admitting: *Deleted

## 2014-03-26 DIAGNOSIS — K7689 Other specified diseases of liver: Secondary | ICD-10-CM

## 2014-03-26 DIAGNOSIS — I10 Essential (primary) hypertension: Secondary | ICD-10-CM

## 2014-03-26 DIAGNOSIS — E1165 Type 2 diabetes mellitus with hyperglycemia: Secondary | ICD-10-CM | POA: Diagnosis not present

## 2014-03-26 DIAGNOSIS — I5021 Acute systolic (congestive) heart failure: Secondary | ICD-10-CM | POA: Diagnosis not present

## 2014-03-26 DIAGNOSIS — K5732 Diverticulitis of large intestine without perforation or abscess without bleeding: Secondary | ICD-10-CM

## 2014-03-26 DIAGNOSIS — E118 Type 2 diabetes mellitus with unspecified complications: Secondary | ICD-10-CM

## 2014-03-26 DIAGNOSIS — IMO0002 Reserved for concepts with insufficient information to code with codable children: Secondary | ICD-10-CM

## 2014-03-26 DIAGNOSIS — D259 Leiomyoma of uterus, unspecified: Secondary | ICD-10-CM | POA: Diagnosis not present

## 2014-03-26 DIAGNOSIS — I509 Heart failure, unspecified: Secondary | ICD-10-CM | POA: Diagnosis not present

## 2014-03-26 DIAGNOSIS — J9600 Acute respiratory failure, unspecified whether with hypoxia or hypercapnia: Secondary | ICD-10-CM | POA: Diagnosis not present

## 2014-03-26 DIAGNOSIS — D649 Anemia, unspecified: Secondary | ICD-10-CM | POA: Diagnosis present

## 2014-03-26 DIAGNOSIS — R570 Cardiogenic shock: Secondary | ICD-10-CM | POA: Diagnosis not present

## 2014-03-26 LAB — BASIC METABOLIC PANEL
Anion gap: 14 (ref 5–15)
BUN: 9 mg/dL (ref 6–23)
CALCIUM: 8.6 mg/dL (ref 8.4–10.5)
CO2: 26 mEq/L (ref 19–32)
CREATININE: 0.75 mg/dL (ref 0.50–1.10)
Chloride: 97 mEq/L (ref 96–112)
GFR calc non Af Amer: 87 mL/min — ABNORMAL LOW (ref >90)
Glucose, Bld: 170 mg/dL — ABNORMAL HIGH (ref 70–99)
Potassium: 4.2 mEq/L (ref 3.7–5.3)
Sodium: 137 mEq/L (ref 137–147)

## 2014-03-26 LAB — GLUCOSE, CAPILLARY
GLUCOSE-CAPILLARY: 138 mg/dL — AB (ref 70–99)
GLUCOSE-CAPILLARY: 153 mg/dL — AB (ref 70–99)
Glucose-Capillary: 142 mg/dL — ABNORMAL HIGH (ref 70–99)
Glucose-Capillary: 113 mg/dL — ABNORMAL HIGH (ref 70–99)

## 2014-03-26 LAB — CBC
HEMATOCRIT: 31.9 % — AB (ref 36.0–46.0)
Hemoglobin: 10.4 g/dL — ABNORMAL LOW (ref 12.0–15.0)
MCH: 26.7 pg (ref 26.0–34.0)
MCHC: 32.6 g/dL (ref 30.0–36.0)
MCV: 82 fL (ref 78.0–100.0)
Platelets: 206 10*3/uL (ref 150–400)
RBC: 3.89 MIL/uL (ref 3.87–5.11)
RDW: 15.4 % (ref 11.5–15.5)
WBC: 8.6 10*3/uL (ref 4.0–10.5)

## 2014-03-26 LAB — PRO B NATRIURETIC PEPTIDE: PRO B NATRI PEPTIDE: 1174 pg/mL — AB (ref 0–125)

## 2014-03-26 MED ORDER — DOCUSATE SODIUM 100 MG PO CAPS
100.0000 mg | ORAL_CAPSULE | Freq: Two times a day (BID) | ORAL | Status: DC
  Administered 2014-03-28 – 2014-03-31 (×6): 100 mg via ORAL
  Filled 2014-03-26 (×12): qty 1

## 2014-03-26 MED ORDER — MAGNESIUM HYDROXIDE 400 MG/5ML PO SUSP: 30.0000 mL | Freq: Once | ORAL | Status: DC

## 2014-03-26 MED ORDER — CAPTOPRIL 6.25 MG HALF TABLET
6.2500 mg | ORAL_TABLET | Freq: Three times a day (TID) | ORAL | Status: DC
  Administered 2014-03-26 – 2014-03-27 (×4): 6.25 mg via ORAL
  Filled 2014-03-26 (×6): qty 1

## 2014-03-26 MED ORDER — IOHEXOL 300 MG/ML  SOLN
25.0000 mL | INTRAMUSCULAR | Status: AC
  Administered 2014-03-26 (×2): 25 mL via ORAL

## 2014-03-26 NOTE — Progress Notes (Signed)
NUTRITION FOLLOW-UP  DOCUMENTATION CODES Per approved criteria  -Obesity Unspecified   INTERVENTION: Continue current interventions. RD to continue to follow nutrition care plan.  NUTRITION DIAGNOSIS: Inadequate oral intake - resolved.  Goal: Enteral nutrition to provide 60-70% of estimated calorie needs (22-25 kcals/kg ideal body weight) and 100% of estimated protein needs, based on ASPEN guidelines for permissive underfeeding in critically ill obese individuals - no longer applicable. Intake to meet >90% of estimated nutrition needs.  Monitor:  weight trends, lab trends, I/O's, PO intake  ASSESSMENT: Pt presented to ED from home with severe respiratory distress. Hx of DM, HTN, erosive esophagitis, gastritis, thyroid disease, and duodenal ulcer.   Intubated 7/4 - 7/5.  Receiving diuresis per cardiology. Plan for R and L heart cath this admission. Ordered for Carbohydrate Modified Medium diet. Pt is eating 100% of meals. She reports that she enjoys the foods here and currently has a good appetite.  Sodium and potassium WNL   Height: Ht Readings from Last 1 Encounters:  03/23/14 5\' 8"  (1.727 m)    Weight: Wt Readings from Last 1 Encounters:  03/26/14 208 lb 5.4 oz (94.5 kg)  Admit wt 222 lb; net 1.4 liters since admission  BMI:  Body mass index is 31.68 kg/(m^2). Class I Obesity   Estimated Nutritional Needs: Kcal: 1550 - 1700 Protein: 80 - 95 g Fluid: per MD  Skin: Intact  Diet Order: Carb Control    Intake/Output Summary (Last 24 hours) at 03/26/14 1329 Last data filed at 03/26/14 0950  Gross per 24 hour  Intake    340 ml  Output   1235 ml  Net   -895 ml    Last BM: 7/4  Labs:   Recent Labs Lab 03/24/14 0300 03/24/14 1700 03/25/14 0120 03/26/14 0320  NA 138 137 137 137  K 3.4* 3.8 3.4* 4.2  CL 101 100 98 97  CO2 23 21 25 26   BUN 8 7 6 9   CREATININE 0.61 0.64 0.69 0.75  CALCIUM 8.1* 8.2* 8.4 8.6  MG  --  1.8  --   --   GLUCOSE 125* 112*  111* 170*    CBG (last 3)   Recent Labs  03/25/14 2140 03/26/14 0818 03/26/14 1222  GLUCAP 169* 138* 153*    Scheduled Meds: . aspirin  81 mg Oral Daily  . atorvastatin  80 mg Oral q1800  . captopril  6.25 mg Oral TID  . enoxaparin (LOVENOX) injection  40 mg Subcutaneous Q24H  . insulin aspart  0-15 Units Subcutaneous TID WC  . insulin aspart  0-5 Units Subcutaneous QHS  . insulin aspart  4 Units Subcutaneous TID WC  . insulin glargine  20 Units Subcutaneous QHS  . pantoprazole  40 mg Oral Q1200  . potassium chloride  40 mEq Oral Once    Continuous Infusions: . sodium chloride 10 mL/hr (03/25/14 1512)    Inda Coke MS, RD, LDN Inpatient Registered Dietitian Pager: [phone] After-hours pager: [phone]

## 2014-03-26 NOTE — Care Management Note (Addendum)
  Page 2 of 2   03/29/2014     11:20:43 AM CARE MANAGEMENT NOTE 03/29/2014  Patient:  [name], [name]   Account Number:  [id number]  Date Initiated:  03/26/2014  Documentation initiated by:  [facility]  Subjective/Objective Assessment:   Admitted with resp distress -  initially intubated - now extubated - on [place].  Lives with daughter [name] and her two sons - age 65 and 71.  Has walker, bedside commode and shower stool at home.  Cannot think of needing anything more.     Action/Plan:   Cm to follow for dispostion needs   Anticipated DC Date:  03/29/2014   Anticipated DC Plan:  [place]  CM consult      [facility] Choice  HOME HEALTH   Choice offered to / List presented to:  C-1 Patient        [place] arranged  HH-1 RN  HH-10 DISEASE MANAGEMENT  HH-2 PT      Status of service:  Completed, signed off Medicare Important Message given?  YES (If response is "NO", the following Medicare IM given date fields will be blank) Date Medicare IM given:  03/26/2014 Medicare IM given by:  [facility] Date Additional Medicare IM given:  03/29/2014 Additional Medicare IM given by:  Zulma [address]  Discharge Disposition:  [place]  Per UR Regulation:  Reviewed for med. necessity/level of care/duration of stay  If discussed at Long Length of Stay Meetings, dates discussed:    Comments:  Jerral Mccauley RN, BSN, MSHL, CCM  Nurse - Case Manager,  ([facility] [place])  [phone]  03/28/2014 Procedure: Right Heart Cath, Left Heart Cath, Selective Coronary Angiography, LV angiography on 03/28/2014 Social:  From home with DTR and grandchildren ages 34 and 10 years old.  Daughter works Teaching laboratory technician. ContactErline Levine Best/DTR  [phone]  or  [phone] Home DME:  Hx/o new glucosemeter 01/2014 [facility]:  Hx/o  referral made 01/2014  - PT RECS:  PT Disposition Plan:  Home with HHS, RN, PT  [facility] / Butch Penny notified)        Contact:  Best,Christina  Daughter [phone]   [phone]  03-26-14 11:45am Luz Lex, RNBSN [phone] Talked with patient and daughter in room.  Daughter works but when she does her sons are with her mom.  Goal is to get back home with family.  Will need PT/OT consult.   At this time cannot think of anyother need at home.  May need HH.  Will place [facility] consult.

## 2014-03-26 NOTE — Progress Notes (Signed)
Received referral from inpatient RNCM for [facility] services. Noted patient was recently being followed by [facility] from last hospital admission. However, patient informed [place] that she did not feel like she needed [facility] services. Spoke with patient at bedside regarding this and to see if she will re-enroll since she has been readmitted to hospital. Patient endorses that she lives with her daughter and feels like she was managing fine at home prior to admission. She maintains that she is not sure if she really needs [facility] Care Management. Asked that Probation officer leave brochure and contact information for her daughter to review.  Will make inpatient RNCM aware.  Marthenia Rolling, MSN- [facility] [phone]

## 2014-03-26 NOTE — Progress Notes (Signed)
[facility]  [name] ZOX:[id number] DOB: Feb 02, 1951 DOA: 03/23/2014 PCP: Alonza Bogus, MD  Time spent :  Brief narrative: 63 y/o woman with obesity, poorly controlled DM2, HTN and TTP transferred from [facility] on 7/4 for respiratory failure and presumed CHF. She was intubated and later transferred to [facility]. Pt was hypotensive which was c/w cardiogenic shock. ECHO revealed significant systolic dysfunction and grade 3 DD with RWMA concerning for ischemic etiology.She stabilized and transferred to SDU.  HPI/Subjective: SOB markedly improved but still with LLQ pain she was experiencing prior to admission  Assessment/Plan: Active Problems:   Acute respiratory failure with hypoxia:   A) Acute systolic CHF (congestive heart failure), NYHA class 3   B) ? CAP -has stabilized-on 2L -diuresis per Cardiology -R and L heart cath planned for this admit (? Thursday) -no focal infiltrate on CXR so PCCM doubtful of PNA etiology    Cardiogenic shock -BP soft but hemodynamically stable    Uncontrolled diabetes mellitus -HgbA1c 12.3 -per pt report and office notes CBG avg 80-200 -was on Lantus and Humalog pre admit-cont Lantus and SSI -diabetes educator consult    ? Diverticulitis large intestine/h/o chronic constipation -initially dx'd 6/25 and was started on Cipro/Flagyl -still having LLQ pain -WBCs were elevated at 13,100 at presentation so likely could have incompletely treated diverticulitis -CT abdomen/pelvis 1.5 cm rounded density seen within the sigmoid colon which may represent residual stool, but mass or or polyp cannot be excluded. H/o chronic constipation so give 1x dose MOM and begin colace    Anemia -? Etiology -ck anemia panel -TSH  Normal   HTN -cont Capoten    GERD (gastroesophageal reflux disease)/ Gastroparesis -cont pre admit Reglan and PPI    Fatty liver -dx'd by PCP and felt to be due to DM -ck LFTs in am    DVT  prophylaxis:  Lovenox Code Status: Full Family Communication: Daughter at bedside Disposition Plan/Expected LOS: Step down   Consultants: Dr. Kirk Ruths (Cardiology) Dr. Merton Border (PCCM)  Procedures: ETT 7/04 >> 7/05  R IJ CVL 7/4 >> 7/06  CULTURES:  Bcx 7/4 x 2 at [facility]  Antibiotics: vanc 7/4 > 7/5  Ceftriaxone 7/4 >> 7/06  Azithro 7/4 >> 7/06   Objective: Blood pressure 109/70, pulse 98, temperature 98.7 F (37.1 C), temperature source Oral, resp. rate 20, height 5\' 8"  (1.727 m), weight 208 lb 5.4 oz (94.5 kg), SpO2 93.00%.  Intake/Output Summary (Last 24 hours) at 03/26/14 1308 Last data filed at 03/26/14 0950  Gross per 24 hour  Intake    340 ml  Output   1235 ml  Net   -895 ml     Exam: General: No acute respiratory distress Lungs: Basilar fine crackles, 2L Cardiovascular: Regular rate and rhythm without murmur gallop or rub normal S1 and S2, no peripheral edema or JVD Abdomen: Nontender, nondistended, soft, bowel sounds positive, no rebound, no ascites, no appreciable mass Musculoskeletal: No significant cyanosis, clubbing of bilateral lower extremities   Scheduled Meds:  Scheduled Meds: . aspirin  81 mg Oral Daily  . atorvastatin  80 mg Oral q1800  . captopril  6.25 mg Oral TID  . enoxaparin (LOVENOX) injection  40 mg Subcutaneous Q24H  . insulin aspart  0-15 Units Subcutaneous TID WC  . insulin aspart  0-5 Units Subcutaneous QHS  . insulin aspart  4 Units Subcutaneous TID WC  . insulin glargine  20 Units Subcutaneous QHS  . pantoprazole  40 mg Oral Q1200  . potassium chloride  40 mEq Oral Once   Continuous Infusions: . sodium chloride 10 mL/hr (03/25/14 1512)    Data Reviewed: Basic Metabolic Panel:  Recent Labs Lab 03/23/14 1000 03/24/14 0300 03/24/14 1700 03/25/14 0120 03/26/14 0320  NA 138 138 137 137 137  K 4.0 3.4* 3.8 3.4* 4.2  CL 103 101 100 98 97  CO2 21 23 21 25 26   GLUCOSE 202* 125* 112* 111* 170*  BUN 11 8 7 6 9     CREATININE 0.73 0.61 0.64 0.69 0.75  CALCIUM 7.9* 8.1* 8.2* 8.4 8.6  MG  --   --  1.8  --   --    Liver Function Tests:  Recent Labs Lab 03/23/14 0316  AST 25  ALT 18  ALKPHOS 60  BILITOT 0.3  PROT 6.7  ALBUMIN 2.8*   No results found for this basename: LIPASE, AMYLASE,  in the last 168 hours No results found for this basename: AMMONIA,  in the last 168 hours CBC:  Recent Labs Lab 03/23/14 0257 03/24/14 0300 03/24/14 1137 03/26/14 0320  WBC 14.2* 9.8 11.7* 8.6  NEUTROABS  --   --  9.2*  --   HGB 13.1 10.4* 10.5* 10.4*  HCT 40.9 32.4* 32.5* 31.9*  MCV 85.6 81.6 81.7 82.0  PLT 241 204 202 206   Cardiac Enzymes:  Recent Labs Lab 03/23/14 0257 03/24/14 1550 03/24/14 1928 03/25/14 0120  TROPONINI <0.30 <0.30 <0.30 <0.30   BNP (last 3 results)  Recent Labs  03/23/14 0257  PROBNP 1031.0*   CBG:  Recent Labs Lab 03/25/14 1158 03/25/14 1559 03/25/14 2140 03/26/14 0818 03/26/14 1222  GLUCAP 130* 146* 169* 138* 153*    Recent Results (from the past 240 hour(s))  CULTURE, BLOOD (ROUTINE X 2)     Status: None   Collection Time    03/23/14  3:16 AM      Result Value Ref Range Status   Specimen Description BLOOD LEFT HAND DRAWN BY RN   Final   Special Requests     Final   Value: BOTTLES DRAWN AEROBIC AND ANAEROBIC 6CC EACH BOTTLE   Culture NO GROWTH 1 DAY   Final   Report Status PENDING   Incomplete  CULTURE, BLOOD (ROUTINE X 2)     Status: None   Collection Time    03/23/14  3:21 AM      Result Value Ref Range Status   Specimen Description BLOOD LEFT HAND   Final   Special Requests     Final   Value: BOTTLES DRAWN AEROBIC AND ANAEROBIC 6CC EACH BOTTLE   Culture NO GROWTH 1 DAY   Final   Report Status PENDING   Incomplete  MRSA PCR SCREENING     Status: None   Collection Time    03/23/14  6:39 AM      Result Value Ref Range Status   MRSA by PCR NEGATIVE  NEGATIVE Final   Comment:            The GeneXpert MRSA Assay (FDA     approved for NASAL  specimens     only), is one component of a     comprehensive MRSA colonization     surveillance program. It is not     intended to diagnose MRSA     infection nor to guide or     monitor treatment for     MRSA infections.     Studies:  Recent x-ray  studies have been reviewed in detail by the Attending Physician       Erin Hearing, ANP [facility] Office  [phone] Pager [phone]   **If unable to reach the above provider after paging please contact the [place] @ [phone]  On-Call/Text Page:      [email or web address]      password [email or web address]  If 7PM-7AM, please contact night-coverage [email or web address] Password [email or web address] 03/26/2014, 1:08 PM   LOS: 3 days   Examined patient and discussed assessment and plan with ANP Ebony Hail agree with the above plan Discussed plan with patient, and answered all questions. Patient with complex multiple medical problems direct patient care> 35 minutes

## 2014-03-26 NOTE — Progress Notes (Signed)
    Subjective:  Denies CP; dyspnea improving   Objective:  Filed Vitals:   03/26/14 0400 03/26/14 0600 03/26/14 0800 03/26/14 0816  BP: 92/58 106/50 110/63 110/63  Pulse: 101 99 97 102  Temp: 99.4 F (37.4 C)   98.7 F (37.1 C)  TempSrc: Oral   Oral  Resp: 26 20 20 21   Height:      Weight: 208 lb 5.4 oz (94.5 kg)     SpO2: 97% 99% 97% 96%    Intake/Output from previous day:  Intake/Output Summary (Last 24 hours) at 03/26/14 1018 Last data filed at 03/26/14 0950  Gross per 24 hour  Intake    370 ml  Output   1555 ml  Net  -1185 ml    Physical Exam: Physical exam: Well-developed well-nourished in no acute distress.  Skin is warm and dry.  HEENT is normal.  Neck is supple.  Chest diminished BS bases Cardiovascular exam is regular rate and rhythm.  Abdominal exam nontender or distended. No masses palpated. Extremities show no edema. neuro grossly intact    Lab Results: Basic Metabolic Panel:  Recent Labs  03/24/14 0300 03/24/14 1700 03/25/14 0120 03/26/14 0320  NA 138 137 137 137  K 3.4* 3.8 3.4* 4.2  CL 101 100 98 97  CO2 23 21 25 26   GLUCOSE 125* 112* 111* 170*  BUN 8 7 6 9   CREATININE 0.61 0.64 0.69 0.75  CALCIUM 8.1* 8.2* 8.4 8.6  MG  --  1.8  --   --    CBC:  Recent Labs  03/24/14 1137 03/26/14 0320  WBC 11.7* 8.6  NEUTROABS 9.2*  --   HGB 10.5* 10.4*  HCT 32.5* 31.9*  MCV 81.7 82.0  PLT 202 206   Cardiac Enzymes:  Recent Labs  03/24/14 1550 03/24/14 1928 03/25/14 0120  TROPONINI <0.30 <0.30 <0.30     Assessment/Plan:  1 Acute systolic CHF-patient's presentation seems most c/w CHF; remains volume overloaded but improving; lasix 40 BID and follow renal function. Will try low dose captopril. No beta blocker given borderline BP.  2 Cardiomyopathy-with wall motion abnormalities, probably ischemia mediated; Continue ASA and statin; will ultimately need R and L cath prior to DC (risks and benefits discussed and patient agrees to  proceed; probably Thurs). 3 ? Pneumonia-on antibiotics per CCM.  4 DM-follow CBGs 5 TTP 6 WCT on admission-baseline LBBB; most likely SVT or sinus tach with baseline LBBB. 7 Hypokalemia-improved  Kirk Ruths 03/26/2014, 10:18 AM

## 2014-03-27 DIAGNOSIS — I5021 Acute systolic (congestive) heart failure: Secondary | ICD-10-CM | POA: Diagnosis not present

## 2014-03-27 DIAGNOSIS — I509 Heart failure, unspecified: Secondary | ICD-10-CM | POA: Diagnosis not present

## 2014-03-27 DIAGNOSIS — J9600 Acute respiratory failure, unspecified whether with hypoxia or hypercapnia: Secondary | ICD-10-CM | POA: Diagnosis not present

## 2014-03-27 LAB — GLUCOSE, CAPILLARY
Glucose-Capillary: 123 mg/dL — ABNORMAL HIGH (ref 70–99)
Glucose-Capillary: 150 mg/dL — ABNORMAL HIGH (ref 70–99)
Glucose-Capillary: 106 mg/dL — ABNORMAL HIGH (ref 70–99)
Glucose-Capillary: 128 mg/dL — ABNORMAL HIGH (ref 70–99)

## 2014-03-27 LAB — CBC WITH DIFFERENTIAL/PLATELET
Basophils Absolute: 0 10*3/uL (ref 0.0–0.1)
Basophils Relative: 0 % (ref 0–1)
EOS ABS: 0.3 10*3/uL (ref 0.0–0.7)
Eosinophils Relative: 4 % (ref 0–5)
HEMATOCRIT: 31.6 % — AB (ref 36.0–46.0)
HEMOGLOBIN: 10.1 g/dL — AB (ref 12.0–15.0)
Lymphocytes Relative: 23 % (ref 12–46)
Lymphs Abs: 2 10*3/uL (ref 0.7–4.0)
MCH: 26.2 pg (ref 26.0–34.0)
MCHC: 32 g/dL (ref 30.0–36.0)
MCV: 82.1 fL (ref 78.0–100.0)
MONO ABS: 1 10*3/uL (ref 0.1–1.0)
MONOS PCT: 11 % (ref 3–12)
NEUTROS PCT: 62 % (ref 43–77)
Neutro Abs: 5.3 10*3/uL (ref 1.7–7.7)
Platelets: 219 10*3/uL (ref 150–400)
RBC: 3.85 MIL/uL — AB (ref 3.87–5.11)
RDW: 15.4 % (ref 11.5–15.5)
WBC: 8.7 10*3/uL (ref 4.0–10.5)

## 2014-03-27 LAB — COMPREHENSIVE METABOLIC PANEL
ALBUMIN: 3 g/dL — AB (ref 3.5–5.2)
ALK PHOS: 53 U/L (ref 39–117)
ALT: 12 U/L (ref 0–35)
AST: 14 U/L (ref 0–37)
Anion gap: 14 (ref 5–15)
BUN: 8 mg/dL (ref 6–23)
CALCIUM: 9.2 mg/dL (ref 8.4–10.5)
CO2: 26 mEq/L (ref 19–32)
Chloride: 98 mEq/L (ref 96–112)
Creatinine, Ser: 0.7 mg/dL (ref 0.50–1.10)
GFR calc Af Amer: >90 mL/min (ref >90)
GFR calc non Af Amer: 89 mL/min — ABNORMAL LOW (ref >90)
Glucose, Bld: 124 mg/dL — ABNORMAL HIGH (ref 70–99)
Potassium: 3.8 mEq/L (ref 3.7–5.3)
SODIUM: 138 meq/L (ref 137–147)
TOTAL PROTEIN: 7.1 g/dL (ref 6.0–8.3)
Total Bilirubin: 0.4 mg/dL (ref 0.3–1.2)

## 2014-03-27 LAB — VITAMIN B12: Vitamin B-12: 245 pg/mL (ref 211–911)

## 2014-03-27 LAB — RETICULOCYTES
RBC.: 3.85 MIL/uL — AB (ref 3.87–5.11)
Retic Count, Absolute: 61.6 10*3/uL (ref 19.0–186.0)
Retic Ct Pct: 1.6 % (ref 0.4–3.1)

## 2014-03-27 LAB — IRON AND TIBC
Iron: 26 ug/dL — ABNORMAL LOW (ref 42–135)
Saturation Ratios: 10 % — ABNORMAL LOW (ref 20–55)
TIBC: 259 ug/dL (ref 250–470)
UIBC: 233 ug/dL (ref 125–400)

## 2014-03-27 LAB — FOLATE: FOLATE: 18.5 ng/mL

## 2014-03-27 LAB — FERRITIN: FERRITIN: 270 ng/mL (ref 10–291)

## 2014-03-27 MED ORDER — POTASSIUM CHLORIDE CRYS ER 20 MEQ PO TBCR
40.0000 meq | EXTENDED_RELEASE_TABLET | Freq: Once | ORAL | Status: AC
  Administered 2014-03-27: 40 meq via ORAL
  Filled 2014-03-27: qty 2

## 2014-03-27 MED ORDER — FUROSEMIDE 40 MG PO TABS
40.0000 mg | ORAL_TABLET | Freq: Two times a day (BID) | ORAL | Status: DC
  Administered 2014-03-27 – 2014-03-31 (×8): 40 mg via ORAL
  Filled 2014-03-27 (×10): qty 1

## 2014-03-27 MED ORDER — SODIUM CHLORIDE 0.9 % IV SOLN: INTRAVENOUS | Status: DC

## 2014-03-27 MED ORDER — ASPIRIN 81 MG PO CHEW
81.0000 mg | CHEWABLE_TABLET | ORAL | Status: AC
  Administered 2014-03-28: 81 mg via ORAL
  Filled 2014-03-27: qty 1

## 2014-03-27 MED ORDER — SODIUM CHLORIDE 0.9 % IV SOLN: 250.0000 mL | INTRAVENOUS | Status: DC | PRN

## 2014-03-27 MED ORDER — SODIUM CHLORIDE 0.9 % IJ SOLN
3.0000 mL | Freq: Two times a day (BID) | INTRAMUSCULAR | Status: DC
  Administered 2014-03-27: 3 mL via INTRAVENOUS

## 2014-03-27 MED ORDER — SODIUM CHLORIDE 0.9 % IJ SOLN: 3.0000 mL | INTRAMUSCULAR | Status: DC | PRN

## 2014-03-27 MED ORDER — GUAIFENESIN-DM 100-10 MG/5ML PO SYRP
5.0000 mL | ORAL_SOLUTION | ORAL | Status: DC | PRN
  Administered 2014-03-27 – 2014-03-29 (×4): 5 mL via ORAL
  Filled 2014-03-27 (×4): qty 5

## 2014-03-27 MED ORDER — CARVEDILOL 3.125 MG PO TABS
3.1250 mg | ORAL_TABLET | Freq: Two times a day (BID) | ORAL | Status: DC
  Administered 2014-03-27 – 2014-03-29 (×4): 3.125 mg via ORAL
  Filled 2014-03-27 (×6): qty 1

## 2014-03-27 MED ORDER — LISINOPRIL 2.5 MG PO TABS
2.5000 mg | ORAL_TABLET | Freq: Every day | ORAL | Status: DC
  Administered 2014-03-27 – 2014-03-31 (×4): 2.5 mg via ORAL
  Filled 2014-03-27 (×5): qty 1

## 2014-03-27 NOTE — Progress Notes (Signed)
Received Diabetes Coordinator consult.  Spoke with patient. States that she was diagnosed about 4-5 years ago.  Sees Dr. Luan Pulling as her PCP. Has been on insulin since diagnosis.  Checks CBGS 3-4 times per day.  States that her blood sugars are usually 88 mg/dl in AM, lunch CBG 116-128 mg/dl, and night CBG is around 150 mg/dl.  Keeps record of blood sugars.  Last visit to Dr. Luan Pulling in June.  States that her HgbA1c always seems to be high lately.  A bit frustrated with what is going on with her.  Encouraged her to continue keeping records and checking blood sugars and taking insulin every day.  Will continue to follow while in hospital. Harvel Ricks RN BSN CDE

## 2014-03-27 NOTE — Progress Notes (Signed)
Report called to receiving RN, Vickii Chafe.  Patient transferred to new room location, 3 E room 4.  Patient transported via wheelchair on 2 L [place] by NT, Shayla.  Patient's chart and medications sent with patient.  Daughter present during transfer.  Safety measures maintained.  Doran Clay, RN

## 2014-03-27 NOTE — Progress Notes (Signed)
Physical Therapy Evaluation Patient Details Name: [name] MRN: [id number] DOB: 07/30/1951 Today's Date: 03/27/2014   History of Present Illness  63 y.o. F admitted 03/23/14 with acute respiratory failure with hypoxia. PMHx of obesity, poorly controlled DM2, HTN, TTP, and presumed CHF.  Clinical Impression  Patient amb well w/o assistive device though O2 drops on amb with and without supplemental O2. Will follow acutely for below listed deficits. Patient is open to [place] and will discuss this option with children.    Follow Up Recommendations Home health PT          Precautions / Restrictions Restrictions Weight Bearing Restrictions: No      Mobility   Transfers Overall transfer level: Needs assistance Equipment used: None Transfers: Sit to/from Stand Sit to Stand: Supervision         General transfer comment: Supervision for patient safety  Ambulation/Gait Ambulation/Gait assistance: Min guard;Supervision Ambulation Distance (Feet): 200 Feet Assistive device: None Gait Pattern/deviations: Step-through pattern;WFL(Within Functional Limits)     General Gait Details: DOE was 2/4 with gait. Patient staggering on amb and states she does not feel steady since this is her first time up in a while.     Balance Overall balance assessment: Needs assistance Sitting-balance support: Feet supported;Bilateral upper extremity supported;No upper extremity supported;Single extremity supported Sitting balance-Leahy Scale: Fair Sitting balance - Comments: Patient used back of the chair for support   Standing balance support: No upper extremity supported Standing balance-Leahy Scale: Good Standing balance comment: Patient able to stand and walk without UE support however did not feel steady and staggered during amb.                             Pertinent Vitals/Pain DOE was 2/4 with gait.     Home Living Family/patient expects to be discharged to:: Private  residence Living Arrangements: Children (Daughter) Available Help at Discharge: Available 24 hours/day Type of Home: House Home Access: Level entry (Maybe a stoop)     Home Layout: One level   Additional Comments: 94 and 70 y.o. grandsons to help    Prior Function Level of Independence: Independent               Hand Dominance   Dominant Hand: Right    Extremity/Trunk Assessment   Upper Extremity Assessment: Defer to OT evaluation           Lower Extremity Assessment: Overall WFL for tasks assessed (Not specifically assessed but [facility] for sit to stand and gait.)         Communication   Communication: No difficulties  Cognition Arousal/Alertness: Awake/alert Behavior During Therapy: WFL for tasks assessed/performed Overall Cognitive Status: Within Functional Limits for tasks assessed                                  PT Assessment Patient needs continued PT services  PT Diagnosis Difficulty walking;Generalized weakness   PT Problem List Decreased strength;Decreased range of motion;Obesity;Decreased mobility;Decreased activity tolerance;Decreased balance;Cardiopulmonary status limiting activity  PT Treatment Interventions Gait training;Functional mobility training;Therapeutic exercise;Therapeutic activities;Modalities   PT Goals (Current goals can be found in the Care Plan section) Acute Rehab PT Goals Patient Stated Goal: Go home and be with family PT Goal Formulation: With patient Time For Goal Achievement: 04/10/14 Potential to Achieve Goals: Good    Frequency Min 3X/week    End of Session Equipment  Utilized During Treatment: Gait belt;Oxygen Activity Tolerance: Patient limited by fatigue (Patient felt needed to catch her breath) Patient left: in chair;with call bell/phone within reach Nurse Communication: Mobility status (Told pt O2 dropped with and without supplemental O2 when amb)         Time: 5498-2641 PT Time Calculation (min):  22 min   Charges:   PT Evaluation $Initial PT Evaluation Tier I: 1 Procedure PT Treatments $Gait Training: 8-22 mins   PT G CodesEber Jones, SPT [phone]

## 2014-03-27 NOTE — Progress Notes (Signed)
    Subjective:  Denies CP; dyspnea improving   Objective:  Filed Vitals:   03/27/14 0329 03/27/14 0756 03/27/14 0800 03/27/14 1000  BP: 105/66 112/68 112/68 117/57  Pulse: 94 95 96 100  Temp: 99.5 F (37.5 C) 99.2 F (37.3 C)  98.4 F (36.9 C)  TempSrc: Oral Oral  Oral  Resp: 19 21 20 20   Height:    5\' 4"  (1.626 m)  Weight:    212 lb (96.163 kg)  SpO2: 96% 95% 94% 96%    Intake/Output from previous day:  Intake/Output Summary (Last 24 hours) at 03/27/14 1213 Last data filed at 03/27/14 0800  Gross per 24 hour  Intake    650 ml  Output      0 ml  Net    650 ml    Physical Exam: Physical exam: Well-developed well-nourished in no acute distress.  Skin is warm and dry.  HEENT is normal.  Neck is supple.  Chest diminished BS bases Cardiovascular exam is regular rate and rhythm.  Abdominal exam nontender or distended. No masses palpated. Extremities show no edema. neuro grossly intact    Lab Results: Basic Metabolic Panel:  Recent Labs  03/24/14 1700  03/26/14 0320 03/27/14 0326  NA 137  < > 137 138  K 3.8  < > 4.2 3.8  CL 100  < > 97 98  CO2 21  < > 26 26  GLUCOSE 112*  < > 170* 124*  BUN 7  < > 9 8  CREATININE 0.64  < > 0.75 0.70  CALCIUM 8.2*  < > 8.6 9.2  MG 1.8  --   --   --   < > = values in this interval not displayed. CBC:  Recent Labs  03/26/14 0320 03/27/14 0326  WBC 8.6 8.7  NEUTROABS  --  5.3  HGB 10.4* 10.1*  HCT 31.9* 31.6*  MCV 82.0 82.1  PLT 206 219   Cardiac Enzymes:  Recent Labs  03/24/14 1550 03/24/14 1928 03/25/14 0120  TROPONINI <0.30 <0.30 <0.30     Assessment/Plan:  1 Acute systolic CHF-patient's presentation seems most c/w CHF; remains volume overloaded but improving; lasix 40 BID and follow renal function. Change captopril to lisinopril. Add coreg 3.125 mg po BID 2 Cardiomyopathy-with wall motion abnormalities, probably ischemia mediated; Continue ASA and statin; will ultimately need R and L cath prior to DC  (risks and benefits discussed and patient agrees to proceed). Plan for tomorrow if stable 3 ? Pneumonia-on antibiotics per CCM.  4 DM-follow CBGs 5 TTP 6 WCT on admission-baseline LBBB; most likely SVT or sinus tach with baseline LBBB. 7 Hypokalemia-improved  [name] 03/27/2014, 12:13 PM

## 2014-03-27 NOTE — Progress Notes (Signed)
Read, reviewed, edited and agree with student's findings and recommendations.  Iris Hairston B. Kourtnee Lahey, PT, DPT #[phone]  

## 2014-03-27 NOTE — Progress Notes (Signed)
[facility]  Ruchy Wildrick NOB:[id number] DOB: 1950/10/19 DOA: 03/23/2014 PCP: Alonza Bogus, MD  Brief narrative: 63 y/o woman with obesity, poorly controlled DM2, HTN and TTP transferred from [facility] on 7/4 for respiratory failure and presumed CHF. She was intubated and later transferred to [facility]. Pt was in cardiogenic shock. ECHO revealed significant systolic dysfunction and grade 3 DD with RWMA concerning for ischemic etiology.  She stabilized and was transferred to the SDU.  HPI/Subjective: No further SOB or LLQ abdominal pain after BMs.  Assessment/Plan:    Acute respiratory failure with hypoxia:   A) Acute systolic CHF (congestive heart failure), NYHA class 3   B) ? CAP -has stabilized-on 2L -diuresis per Cardiology -R and L heart cath planned for this admit (Thursday) -no focal infiltrate on CXR so doubtful of PNA etiology    Cardiogenic shock -BP soft but hemodynamically stable    Uncontrolled diabetes mellitus -HgbA1c 12.3 -per pt report and office notes CBG avg 80-200 -was on Lantus and Humalog pre admit - cont Lantus and SSI -diabetes educator consult    ? Diverticulitis / h/o chronic constipation -initially dx'd 6/25 and was started on Cipro/Flagyl prior to admission based on complaint of LLQ pain -WBCs were elevated at 13,100 at presentation so could have incompletely treated diverticulitis but a CT abdomen/pelvis demonstrated a 1.5 cm rounded density within the sigmoid colon which may represent residual stool, but mass or or polyp cannot be excluded. H/o chronic constipation so gave 1x dose MOM and began colace -pt had 5 BMs 7/7 and LLQ pain has resolved -pt had colonoscopy 2013 without any problems reported -pt will need f/u of possible 1.5cm density in the sigmoid colon, after her cardiac eval is completed (either via BE, or colonoscopy)    Anemia -Anemia panel with mildly low iron  -TSH normal -hgb stable around 10    HTN -cont ACE   GERD (gastroesophageal reflux disease)/ Gastroparesis -cont pre admit Reglan and PPI    Fatty liver -dx'd by PCP  -LFTs normal except for low albumin  DVT prophylaxis:  Lovenox Code Status: Full Family Communication: daughter at bedside Disposition Plan/Expected LOS: Transfer to telemetry  Consultants: Dr. Kirk Ruths (Cardiology) Dr. Merton Border (PCCM)  Procedures: ETT 7/04 >> 7/05  R IJ CVL 7/4 >> 7/06  Antibiotics: vanc 7/4 > 7/5  Ceftriaxone 7/4 >> 7/06  Azithro 7/4 >> 7/06  Objective: Blood pressure 117/57, pulse 100, temperature 98.4 F (36.9 C), temperature source Oral, resp. rate 20, height 5\' 4"  (1.626 m), weight 212 lb (96.163 kg), SpO2 96.00%.  Intake/Output Summary (Last 24 hours) at 03/27/14 1422 Last data filed at 03/27/14 0800  Gross per 24 hour  Intake    410 ml  Output      0 ml  Net    410 ml   Exam: General: No acute respiratory distress Lungs: CTA bilaterally without rhonchi or wheezes Cardiovascular: Regular rate and rhythm without murmur gallop or rub normal S1 and S2, no peripheral edema  Abdomen: Nontender, nondistended, soft, bowel sounds positive, no rebound, no ascites, no appreciable mass Musculoskeletal: No significant cyanosis, clubbing of bilateral lower extremities   Scheduled Meds:  Scheduled Meds: . aspirin  81 mg Oral Daily  . atorvastatin  80 mg Oral q1800  . carvedilol  3.125 mg Oral BID WC  . docusate sodium  100 mg Oral BID  . enoxaparin (LOVENOX) injection  40 mg Subcutaneous Q24H  . furosemide  40 mg Oral BID  . insulin aspart  0-15 Units Subcutaneous TID WC  . insulin aspart  0-5 Units Subcutaneous QHS  . insulin aspart  4 Units Subcutaneous TID WC  . insulin glargine  20 Units Subcutaneous QHS  . lisinopril  2.5 mg Oral Daily  . magnesium hydroxide  30 mL Oral Once  . pantoprazole  40 mg Oral Q1200  . potassium chloride  40 mEq Oral Once   Data Reviewed: Basic Metabolic Panel:  Recent  Labs Lab 03/24/14 0300 03/24/14 1700 03/25/14 0120 03/26/14 0320 03/27/14 0326  NA 138 137 137 137 138  K 3.4* 3.8 3.4* 4.2 3.8  CL 101 100 98 97 98  CO2 23 21 25 26 26   GLUCOSE 125* 112* 111* 170* 124*  BUN 8 7 6 9 8   CREATININE 0.61 0.64 0.69 0.75 0.70  CALCIUM 8.1* 8.2* 8.4 8.6 9.2  MG  --  1.8  --   --   --    Liver Function Tests:  Recent Labs Lab 03/23/14 0316 03/27/14 0326  AST 25 14  ALT 18 12  ALKPHOS 60 53  BILITOT 0.3 0.4  PROT 6.7 7.1  ALBUMIN 2.8* 3.0*   CBC:  Recent Labs Lab 03/23/14 0257 03/24/14 0300 03/24/14 1137 03/26/14 0320 03/27/14 0326  WBC 14.2* 9.8 11.7* 8.6 8.7  NEUTROABS  --   --  9.2*  --  5.3  HGB 13.1 10.4* 10.5* 10.4* 10.1*  HCT 40.9 32.4* 32.5* 31.9* 31.6*  MCV 85.6 81.6 81.7 82.0 82.1  PLT 241 204 202 206 219   Cardiac Enzymes:  Recent Labs Lab 03/23/14 0257 03/24/14 1550 03/24/14 1928 03/25/14 0120  TROPONINI <0.30 <0.30 <0.30 <0.30   BNP (last 3 results)  Recent Labs  03/23/14 0257 03/26/14 0320  PROBNP 1031.0* 1174.0*   CBG:  Recent Labs Lab 03/26/14 1222 03/26/14 1716 03/26/14 2136 03/27/14 0757 03/27/14 1137  GLUCAP 153* 142* 113* 123* 150*    Recent Results (from the past 240 hour(s))  CULTURE, BLOOD (ROUTINE X 2)     Status: None   Collection Time    03/23/14  3:16 AM      Result Value Ref Range Status   Specimen Description BLOOD LEFT HAND DRAWN BY RN   Final   Special Requests     Final   Value: BOTTLES DRAWN AEROBIC AND ANAEROBIC 6CC EACH BOTTLE   Culture NO GROWTH 4 DAYS   Final   Report Status PENDING   Incomplete  CULTURE, BLOOD (ROUTINE X 2)     Status: None   Collection Time    03/23/14  3:21 AM      Result Value Ref Range Status   Specimen Description BLOOD LEFT HAND   Final   Special Requests     Final   Value: BOTTLES DRAWN AEROBIC AND ANAEROBIC 6CC EACH BOTTLE   Culture NO GROWTH 4 DAYS   Final   Report Status PENDING   Incomplete  MRSA PCR SCREENING     Status: None    Collection Time    03/23/14  6:39 AM      Result Value Ref Range Status   MRSA by PCR NEGATIVE  NEGATIVE Final   Comment:            The GeneXpert MRSA Assay (FDA     approved for NASAL specimens     only), is one component of a     comprehensive MRSA colonization     surveillance  program. It is not     intended to diagnose MRSA     infection nor to guide or     monitor treatment for     MRSA infections.     Studies:  Recent x-ray studies have been reviewed in detail by the Attending Physician  Time spent : 54mins     Allison Ellis, ANP [facility] Office  [phone] Pager [phone]   **If unable to reach the above provider after paging please contact the [place] [facility] @ [phone]  On-Call/Text Page:      [email or web address]      password [email or web address]  If 7PM-7AM, please contact night-coverage [email or web address] Password [email or web address] 03/27/2014, 2:22 PM   LOS: 4 days   I have personally examined this patient and reviewed the entire database. I have reviewed the above note, made any necessary editorial changes, and agree with its content.  Cherene Altes, MD [facility]

## 2014-03-28 ENCOUNTER — Encounter (HOSPITAL_COMMUNITY)
Admission: EM | Disposition: A | Discharge status: Home Health | Payer: Self-pay | Source: Home / Self Care | Attending: Internal Medicine

## 2014-03-28 DIAGNOSIS — I2510 Atherosclerotic heart disease of native coronary artery without angina pectoris: Secondary | ICD-10-CM

## 2014-03-28 DIAGNOSIS — I5021 Acute systolic (congestive) heart failure: Secondary | ICD-10-CM

## 2014-03-28 DIAGNOSIS — I5041 Acute combined systolic (congestive) and diastolic (congestive) heart failure: Secondary | ICD-10-CM | POA: Diagnosis not present

## 2014-03-28 DIAGNOSIS — K5900 Constipation, unspecified: Secondary | ICD-10-CM

## 2014-03-28 DIAGNOSIS — I429 Cardiomyopathy, unspecified: Secondary | ICD-10-CM

## 2014-03-28 DIAGNOSIS — J9600 Acute respiratory failure, unspecified whether with hypoxia or hypercapnia: Secondary | ICD-10-CM | POA: Diagnosis not present

## 2014-03-28 DIAGNOSIS — R570 Cardiogenic shock: Secondary | ICD-10-CM | POA: Diagnosis not present

## 2014-03-28 HISTORY — DX: Cardiomyopathy, unspecified: I42.9

## 2014-03-28 HISTORY — PX: LEFT AND RIGHT HEART CATHETERIZATION WITH CORONARY ANGIOGRAM: SHX5449

## 2014-03-28 LAB — POCT I-STAT 3, VENOUS BLOOD GAS (G3P V)
ACID-BASE EXCESS: 4 mmol/L — AB (ref 0.0–2.0)
BICARBONATE: 29.1 meq/L — AB (ref 20.0–24.0)
O2 Saturation: 62 %
PCO2 VEN: 44.4 mmHg — AB (ref 45.0–50.0)
PO2 VEN: 31 mmHg (ref 30.0–45.0)
TCO2: 30 mmol/L (ref 0–100)
pH, Ven: 7.425 — ABNORMAL HIGH (ref 7.250–7.300)

## 2014-03-28 LAB — CBC
HCT: 33.9 % — ABNORMAL LOW (ref 36.0–46.0)
Hemoglobin: 10.9 g/dL — ABNORMAL LOW (ref 12.0–15.0)
MCH: 26.7 pg (ref 26.0–34.0)
MCHC: 32.2 g/dL (ref 30.0–36.0)
MCV: 82.9 fL (ref 78.0–100.0)
Platelets: ADEQUATE 10*3/uL (ref 150–400)
RBC: 4.09 MIL/uL (ref 3.87–5.11)
RDW: 15.3 % (ref 11.5–15.5)
WBC: 6.6 10*3/uL (ref 4.0–10.5)

## 2014-03-28 LAB — CULTURE, BLOOD (ROUTINE X 2)
CULTURE: NO GROWTH
Culture: NO GROWTH

## 2014-03-28 LAB — POCT I-STAT 3, ART BLOOD GAS (G3+)
ACID-BASE EXCESS: 5 mmol/L — AB (ref 0.0–2.0)
ACID-BASE EXCESS: 1 mmol/L (ref 0.0–2.0)
BICARBONATE: 29.4 meq/L — AB (ref 20.0–24.0)
BICARBONATE: 26 meq/L — AB (ref 20.0–24.0)
O2 SAT: 84 %
O2 Saturation: 90 %
PH ART: 7.413 (ref 7.350–7.450)
TCO2: 31 mmol/L (ref 0–100)
TCO2: 27 mmol/L (ref 0–100)
pCO2 arterial: 43.8 mmHg (ref 35.0–45.0)
pCO2 arterial: 40.7 mmHg (ref 35.0–45.0)
pH, Arterial: 7.436 (ref 7.350–7.450)
pO2, Arterial: 47 mmHg — ABNORMAL LOW (ref 80.0–100.0)
pO2, Arterial: 59 mmHg — ABNORMAL LOW (ref 80.0–100.0)

## 2014-03-28 LAB — BASIC METABOLIC PANEL
Anion gap: 12 (ref 5–15)
BUN: 8 mg/dL (ref 6–23)
CHLORIDE: 98 meq/L (ref 96–112)
CO2: 27 mEq/L (ref 19–32)
Calcium: 9.4 mg/dL (ref 8.4–10.5)
Creatinine, Ser: 0.73 mg/dL (ref 0.50–1.10)
GFR calc non Af Amer: 88 mL/min — ABNORMAL LOW (ref >90)
GLUCOSE: 172 mg/dL — AB (ref 70–99)
Potassium: 4.4 mEq/L (ref 3.7–5.3)
Sodium: 137 mEq/L (ref 137–147)

## 2014-03-28 LAB — GLUCOSE, CAPILLARY
GLUCOSE-CAPILLARY: 124 mg/dL — AB (ref 70–99)
GLUCOSE-CAPILLARY: 103 mg/dL — AB (ref 70–99)
Glucose-Capillary: 143 mg/dL — ABNORMAL HIGH (ref 70–99)
Glucose-Capillary: 116 mg/dL — ABNORMAL HIGH (ref 70–99)
Glucose-Capillary: 123 mg/dL — ABNORMAL HIGH (ref 70–99)

## 2014-03-28 LAB — CREATININE, SERUM
Creatinine, Ser: 0.64 mg/dL (ref 0.50–1.10)
GFR calc Af Amer: >90 mL/min (ref >90)
GFR calc non Af Amer: >90 mL/min (ref >90)

## 2014-03-28 SURGERY — LEFT AND RIGHT HEART CATHETERIZATION WITH CORONARY ANGIOGRAM
Anesthesia: LOCAL

## 2014-03-28 MED ORDER — POLYETHYLENE GLYCOL 3350 17 G PO PACK
17.0000 g | PACK | Freq: Two times a day (BID) | ORAL | Status: DC
  Administered 2014-03-28: 17 g via ORAL
  Filled 2014-03-28 (×4): qty 1

## 2014-03-28 MED ORDER — SODIUM CHLORIDE 0.9 % IV SOLN
1.0000 mL/kg/h | INTRAVENOUS | Status: AC
  Administered 2014-03-28: 1 mL/kg/h via INTRAVENOUS

## 2014-03-28 MED ORDER — MIDAZOLAM HCL 2 MG/2ML IJ SOLN
INTRAMUSCULAR | Status: AC
  Filled 2014-03-28: qty 2

## 2014-03-28 MED ORDER — HEPARIN (PORCINE) IN NACL 2-0.9 UNIT/ML-% IJ SOLN
INTRAMUSCULAR | Status: AC
  Filled 2014-03-28: qty 1000

## 2014-03-28 MED ORDER — ONDANSETRON HCL 4 MG/2ML IJ SOLN: 4.0000 mg | Freq: Four times a day (QID) | INTRAMUSCULAR | Status: DC | PRN

## 2014-03-28 MED ORDER — VERAPAMIL HCL 2.5 MG/ML IV SOLN
INTRAVENOUS | Status: AC
  Filled 2014-03-28: qty 2

## 2014-03-28 MED ORDER — LIDOCAINE HCL (PF) 1 % IJ SOLN
INTRAMUSCULAR | Status: AC
  Filled 2014-03-28: qty 30

## 2014-03-28 MED ORDER — ACETAMINOPHEN 325 MG PO TABS: 650.0000 mg | ORAL_TABLET | ORAL | Status: DC | PRN

## 2014-03-28 MED ORDER — HEPARIN SODIUM (PORCINE) 1000 UNIT/ML IJ SOLN
INTRAMUSCULAR | Status: AC
  Filled 2014-03-28: qty 1

## 2014-03-28 MED ORDER — ENOXAPARIN SODIUM 40 MG/0.4ML ~~LOC~~ SOLN
40.0000 mg | SUBCUTANEOUS | Status: DC
  Administered 2014-03-29 – 2014-03-30 (×2): 40 mg via SUBCUTANEOUS
  Filled 2014-03-28 (×4): qty 0.4

## 2014-03-28 MED ORDER — NITROGLYCERIN 0.2 MG/ML ON CALL CATH LAB
INTRAVENOUS | Status: AC
  Filled 2014-03-28: qty 1

## 2014-03-28 MED ORDER — FENTANYL CITRATE 0.05 MG/ML IJ SOLN
INTRAMUSCULAR | Status: AC
  Filled 2014-03-28: qty 2

## 2014-03-28 NOTE — Progress Notes (Signed)
OT Cancellation Note  Patient Details Name: [name] MRN: [id number] DOB: 04-27-1951   Cancelled Treatment:    Reason Eval/Treat Not Completed: Patient at procedure or test/ unavailable;Other (comment). Pt in surgery this a.m. for cardiac cath and angiogram. Will re attempt tomorrow as appropriate  Britt Bottom 03/28/2014, 10:02 AM

## 2014-03-28 NOTE — Progress Notes (Signed)
Heart Failure Navigator Consult Note  Presentation: [name] is a 63 y/o woman with obesity, poorly controlled DM2, HTN and TTP transferred from [facility] on 7/4 for respiratory failure and presumed CHF.  Recently admitted to [facility] with intractable n/v felt secondary to DM2 gastroparesis. No apparent h/o CAD or CHF.  Daughter reported several day h/o SOB and cough. Last night woke up very SOB. EMS called. Brought to [facility].  WBC 14.2 CXR with CHF and small effusions. Initial ECG with WCT at 150 (?VT vs SVT with aberrancy). Patient became less responsive and hypotensive and intubated in ER. Started on propofol and BP worse so started on levophed - titrated up to 30. On arrival SBP ~100. Also given 2L IVF at [facility]. Started on vancomycin and zosyn.   Past Medical History  Diagnosis Date  . Diabetes mellitus   . Hypertension   . Arthritis   . TTP (thrombotic thrombocytopenic purpura)   . Erosive esophagitis   . Gastritis   . Anemia   . Thyroid disease   . Duodenal ulcer     Age 63  . Obese   . Short-term memory loss     History   Social History  . Marital Status: Divorced    Spouse Name: N/A    Number of Children: 3  . Years of Education: N/A   Occupational History  . disabled    Social History Main Topics  . Smoking status: Former Smoker -- 0.25 packs/day for 10 years    Types: Cigarettes  . Smokeless tobacco: None  . Alcohol Use: No  . Drug Use: No  . Sexual Activity: None   Other Topics Concern  . None   Social History Narrative   Lives w/ daughter, [name]          ECHO:Study Conclusions--03/24/14  - Left ventricle: The cavity size was normal. There was mild focal basal hypertrophy of the septum. Systolic function was severely reduced. The estimated ejection fraction was in the range of 25% to 30%. There is akinesis of the entireanteroseptal myocardium. Doppler parameters are consistent with a reversible restrictive pattern, indicative of decreased left  ventricular diastolic compliance and/or increased left atrial pressure (grade 3 diastolic dysfunction). - Mitral valve: There was mild regurgitation. - Left atrium: The atrium was moderately dilated.  Transthoracic echocardiography. M-mode, complete 2D, spectral Doppler, and color Doppler. Birthdate: Patient birthdate: 05-26-1951. Age: Patient is 63 yr old. Sex: Gender: female. Height: Height: 172.7 cm. Height: 68 in. Weight: Weight: 100.7 kg. Weight: 221.5 lb. Body mass index: BMI: 33.8 kg/m^2. Body surface area: BSA: 2.23 m^2. Blood pressure: 96/63 Patient status: Inpatient. Study date: Study date: 03/24/2014. Study time: 07:56 AM.  Cardiac Catheterization--03/28/14 Procedural Findings:  Hemodynamics  RA 8/0 mean 4 mm Hg  RV 42/4 mm Hg  PA 44/14 mean 26 mm Hg  PCWP 20/28 mean 21 mm Hg  LV 98/18 mm Hg  AO 95/54 mean 72 mm Hg  Oxygen saturations:  PA 62%  AO 90%  Cardiac Output (Fick) 6.9 L/min  Cardiac Index (Fick) 3.4 L/min/meter squared  Coronary angiography:  Coronary dominance: right  Left mainstem: Normal  Left anterior descending (LAD): Diffuse disease in the proximal vessel up to 20%. 30-40% disease in the mid vessel prior to the second diagonal.  Left circumflex (LCx): 40% in OM1. 20-30% in OM2. Otherwise no significant disease.  Right coronary artery (RCA): 99% subtotal occlusion at the crux. TIMI 1 flow. Left to right collaterals to the distal vessel.  Left ventriculography: Left ventricular systolic function is abnormal, LVEF is estimated at 25%. There is inferior akinesis and severe global hypokinesis. There is mild mitral regurgitation  Final Conclusions:  1. Single vessel obstructive CAD with subtotal occlusion of the RCA at the crux.  2. Severe LV dysfunction.  3. Normal Right heart pressures. Elevated PCWP.  Recommendations: Medical management. Her degree of LV dysfunction is out of proportion to her CAD. She has no anginal symptoms so PCI of RCA not indicated.  The inferior wall appears scarred   BNP    Component Value Date/Time   PROBNP 1174.0* 03/26/2014 0320    Education Assessment and Provision:  Detailed education and instructions provided on heart failure disease management including the following:  Signs and symptoms of Heart Failure When to call the physician Importance of daily weights Low sodium diet Fluid restriction Medication management Anticipated future follow-up appointments  Patient education given on each of the above topics.  Patient acknowledges understanding and acceptance of all instructions.  I briefly discussed above today as patient seems overwhelmed with new diagnosis.  She was open to teaching and asked pertinent questions.  She does relate that she already struggles with food choices secondary to diabetes.  She says that her favorite and frequent meal is [employer] tomato soup.  I strongly urged her against this choice and we discussed ways to make tomato soup without added salt.  I will consult dietician as well to assist her with appropriate menu including low sodium food choices for home.  I plan to visit her again tomorrow for additonal education and reinforcement.  I will also collaborate with care management to arrange [facility] visits at discharge for ongoing education and compliance reinforcement.  Education Materials:  "Living Better With Heart Failure" Booklet, Daily Weight Tracker Tool    High Risk Criteria for Readmission and/or Poor Patient Outcomes:  (Recommend Follow-up with [facility] Failure [facility])--Yes outpatient follow-up for transition recommended   EF <30%- Yes 25-30%   2 or more admissions in 6 months- Yes  Difficult social situation- No  Demonstrates medication noncompliance- No    Barriers of Care:  Knowledge and ability /desire to be compliant  Discharge Planning:  Plans to discharge to home with daughter.  She will benefit from [facility] and HHPT--Daughter works nightshift and she  routinely looks after her children 63 and 17 while daughter sleeps.

## 2014-03-28 NOTE — Progress Notes (Signed)
Patient c/o cough.  Cough Syrup given per MAR. RN will continue to monitor. Shellee Milo, RN

## 2014-03-28 NOTE — Progress Notes (Signed)
0700-1900 shift. Pt.is A/Ox4 and is ambulatory to [facility] with 1 person assist. She had no c/o pain and no signs of distress. Pt.had cardiac cath done today with TR band to right radial. Site monitored and VS signs were taken. TR Band was deflated and transparent dressing with gauze was placed. Site remains a level 0. No hematoma or bruising noted. Patient tolerated advanced diet well.

## 2014-03-28 NOTE — CV Procedure (Signed)
    Cardiac Catheterization Procedure Note  Name: [name] MRN: [id number] DOB: [birth year]  Procedure: Right Heart Cath, Left Heart Cath, Selective Coronary Angiography, LV angiography  Indication: 63 yo BF presents with acute systolic CHF. EF 25-30% by Echo with regional wall motion abnormality.   Procedural Details: The right wrist was prepped, draped, and anesthetized with 1% lidocaine. Using the modified Seldinger technique a 6 Fr slender sheath was placed in the right radial artery and a 5 French sheath was placed in the right brachial vein. A Swan-Ganz catheter was used for the right heart catheterization. Standard protocol was followed for recording of right heart pressures and sampling of oxygen saturations. Fick cardiac output was calculated. Standard Judkins catheters were used for selective coronary angiography and left ventriculography. There were no immediate procedural complications. The patient was transferred to the post catheterization recovery area for further monitoring.  Procedural Findings: Hemodynamics RA 8/0 mean 4 mm Hg RV 42/4 mm Hg PA 44/14 mean 26 mm Hg PCWP 20/28 mean 21 mm Hg LV 98/18 mm Hg AO 95/54 mean 72 mm Hg  Oxygen saturations: PA 62% AO 90%  Cardiac Output (Fick) 6.9 L/min  Cardiac Index (Fick) 3.4 L/min/meter squared   Coronary angiography: Coronary dominance: right  Left mainstem: Normal  Left anterior descending (LAD): Diffuse disease in the proximal vessel up to 20%. 30-40% disease in the mid vessel prior to the second diagonal.   Left circumflex (LCx): 40% in OM1. 20-30% in OM2. Otherwise no significant disease.  Right coronary artery (RCA): 99% subtotal occlusion at the crux. TIMI 1 flow. Left to right collaterals to the distal vessel.   Left ventriculography: Left ventricular systolic function is abnormal, LVEF is estimated at 25%. There is inferior akinesis and severe global hypokinesis. There is mild mitral regurgitation    Final Conclusions:   1. Single vessel obstructive CAD with subtotal occlusion of the RCA at the crux.  2. Severe LV dysfunction. 3. Normal Right heart pressures. Elevated PCWP.  Recommendations: Medical management. Her degree of LV dysfunction is out of proportion to her CAD. She has no anginal symptoms so PCI of RCA not indicated. The inferior wall appears scarred.   Peter Martinique, [place] 03/28/2014, 10:37 AM

## 2014-03-28 NOTE — H&P (View-Only) (Signed)
    Subjective:  Denies CP; dyspnea improving   Objective:  Filed Vitals:   03/27/14 0329 03/27/14 0756 03/27/14 0800 03/27/14 1000  BP: 105/66 112/68 112/68 117/57  Pulse: 94 95 96 100  Temp: 99.5 F (37.5 C) 99.2 F (37.3 C)  98.4 F (36.9 C)  TempSrc: Oral Oral  Oral  Resp: 19 21 20 20   Height:    5\' 4"  (1.626 m)  Weight:    212 lb (96.163 kg)  SpO2: 96% 95% 94% 96%    Intake/Output from previous day:  Intake/Output Summary (Last 24 hours) at 03/27/14 1213 Last data filed at 03/27/14 0800  Gross per 24 hour  Intake    650 ml  Output      0 ml  Net    650 ml    Physical Exam: Physical exam: Well-developed well-nourished in no acute distress.  Skin is warm and dry.  HEENT is normal.  Neck is supple.  Chest diminished BS bases Cardiovascular exam is regular rate and rhythm.  Abdominal exam nontender or distended. No masses palpated. Extremities show no edema. neuro grossly intact    Lab Results: Basic Metabolic Panel:  Recent Labs  03/24/14 1700  03/26/14 0320 03/27/14 0326  NA 137  < > 137 138  K 3.8  < > 4.2 3.8  CL 100  < > 97 98  CO2 21  < > 26 26  GLUCOSE 112*  < > 170* 124*  BUN 7  < > 9 8  CREATININE 0.64  < > 0.75 0.70  CALCIUM 8.2*  < > 8.6 9.2  MG 1.8  --   --   --   < > = values in this interval not displayed. CBC:  Recent Labs  03/26/14 0320 03/27/14 0326  WBC 8.6 8.7  NEUTROABS  --  5.3  HGB 10.4* 10.1*  HCT 31.9* 31.6*  MCV 82.0 82.1  PLT 206 219   Cardiac Enzymes:  Recent Labs  03/24/14 1550 03/24/14 1928 03/25/14 0120  TROPONINI <0.30 <0.30 <0.30     Assessment/Plan:  1 Acute systolic CHF-patient's presentation seems most c/w CHF; remains volume overloaded but improving; lasix 40 BID and follow renal function. Change captopril to lisinopril. Add coreg 3.125 mg po BID 2 Cardiomyopathy-with wall motion abnormalities, probably ischemia mediated; Continue ASA and statin; will ultimately need R and L cath prior to DC  (risks and benefits discussed and patient agrees to proceed). Plan for tomorrow if stable 3 ? Pneumonia-on antibiotics per CCM.  4 DM-follow CBGs 5 TTP 6 WCT on admission-baseline LBBB; most likely SVT or sinus tach with baseline LBBB. 7 Hypokalemia-improved  Kirk Ruths 03/27/2014, 12:13 PM

## 2014-03-28 NOTE — Progress Notes (Signed)
UR completed Christina Bilyeu K. Dashan Chizmar, RN, BSN, [place], CCM  03/28/2014 9:43 PM

## 2014-03-28 NOTE — Interval H&P Note (Signed)
History and Physical Interval Note:  03/28/2014 9:47 AM  [name]  has presented today for surgery, with the diagnosis of cp/hf  The various methods of treatment have been discussed with the patient and family. After consideration of risks, benefits and other options for treatment, the patient has consented to  Procedure(s): LEFT AND RIGHT HEART CATHETERIZATION WITH CORONARY ANGIOGRAM (N/A) as a surgical intervention .  The patient's history has been reviewed, patient examined, no change in status, stable for surgery.  I have reviewed the patient's chart and labs.  Questions were answered to the patient's satisfaction.    Cath Lab Visit (complete for each Cath Lab visit)  Clinical Evaluation Leading to the Procedure:   ACS: No.  Non-ACS:    Anginal Classification: CCS IV  Anti-ischemic medical therapy: Minimal Therapy (1 class of medications)  Non-Invasive Test Results: No non-invasive testing performed  Prior CABG: No previous CABG       Collier Salina [facility] 03/28/2014 9:47 AM

## 2014-03-28 NOTE — Progress Notes (Addendum)
[facility] PROGRESS NOTE Interim History: 63 y/o woman with obesity, poorly controlled DM2, HTN and TTP transferred from [facility] on 7/4 for respiratory failure and presumed CHF. She was intubated and later transferred to [facility]. Pt was in cardiogenic shock. ECHO revealed significant systolic dysfunction and grade 3 DD with RWMA concerning for ischemic etiology  Assessment/Plan: Acute respiratory failure with hypoxia/  Acute combined systolic and diastolic CHF (congestive heart failure), NYHA class 3: - Unlikely PNA. Ptt NPO for procedure. - cont diuretics per cards, echo as below with wall motion. - cath on 7.9.2015. Weight is unchanged at 96.0 kg. - cont Coreg, ACE-I and lasix.  Anemia  -? Etiology  - b12 low, supplement, keep Hbg > 9.0 -TSH Normal  Cardiogenic shock  -BP soft but hemodynamically stable  Uncontrolled diabetes mellitus  -HgbA1c 12.3  -per pt report and office notes CBG avg 80-200  -was on Lantus and Humalog pre admit-cont Lantus and SSI  ? Diverticulitis large intestine - initially dx'd 6/25 and was started on Cipro/Flagyl  - CT abdomen/pelvis 1.5 cm rounded density seen within the sigmoid colon which may represent residual stool, but mass or or polyp cannot be excluded. H/o chronic constipation so give 1x dose MOM and begin colace  HTN  -cont Coreg and ACE-I   Code Status: full Family Communication: none  Disposition Plan: inpatient   Consultants:  Cardiology  PCCM  Procedures: Echo 7.7.2015: ejection fraction was in the range of 25% to 30%. There is akinesis of the entireanteroseptal myocardium. Doppler parameters are consistent with a reversible restrictive pattern, indicative of decreased left ventricular diastolic compliance and/or increased left atrial pressure (grade 3 diastolic dysfunction). Cath 7.9.2015:  Antibiotics:  none   HPI/Subjective: No complains  Objective: Filed Vitals:   03/27/14 1500 03/27/14 2142 03/28/14 0100  03/28/14 0619  BP:  123/72 119/71 113/75  Pulse: 101 104 101 98  Temp:  98.8 F (37.1 C) 98.6 F (37 C)   TempSrc:  Oral Oral Oral  Resp:  22 18 20   Height:      Weight:    96.072 kg (211 lb 12.8 oz)  SpO2: 96% 95% 97% 95%    Intake/Output Summary (Last 24 hours) at 03/28/14 0733 Last data filed at 03/27/14 2338  Gross per 24 hour  Intake    900 ml  Output    551 ml  Net    349 ml   Filed Weights   03/26/14 0400 03/27/14 1000 03/28/14 0619  Weight: 94.5 kg (208 lb 5.4 oz) 96.163 kg (212 lb) 96.072 kg (211 lb 12.8 oz)    Exam:  General: Alert, awake, oriented x3, in no acute distress.  HEENT: No bruits, no goiter.  Heart: Regular rate and rhythm. Lungs: Good air movement, clear Abdomen: Soft, nontender, nondistended, positive bowel sounds.  Neuro: Grossly intact, nonfocal.   Data Reviewed: Basic Metabolic Panel:  Recent Labs Lab 03/24/14 0300 03/24/14 1700 03/25/14 0120 03/26/14 0320 03/27/14 0326 03/28/14 0300  NA 138 137 137 137 138 137  K 3.4* 3.8 3.4* 4.2 3.8 4.4  CL 101 100 98 97 98 98  CO2 23 21 25 26 26 27   GLUCOSE 125* 112* 111* 170* 124* 172*  BUN 8 7 6 9 8 8   CREATININE 0.61 0.64 0.69 0.75 0.70 0.73  CALCIUM 8.1* 8.2* 8.4 8.6 9.2 9.4  MG  --  1.8  --   --   --   --    Liver Function Tests:  Recent  Labs Lab 03/23/14 0316 03/27/14 0326  AST 25 14  ALT 18 12  ALKPHOS 60 53  BILITOT 0.3 0.4  PROT 6.7 7.1  ALBUMIN 2.8* 3.0*   No results found for this basename: LIPASE, AMYLASE,  in the last 168 hours No results found for this basename: AMMONIA,  in the last 168 hours CBC:  Recent Labs Lab 03/23/14 0257 03/24/14 0300 03/24/14 1137 03/26/14 0320 03/27/14 0326  WBC 14.2* 9.8 11.7* 8.6 8.7  NEUTROABS  --   --  9.2*  --  5.3  HGB 13.1 10.4* 10.5* 10.4* 10.1*  HCT 40.9 32.4* 32.5* 31.9* 31.6*  MCV 85.6 81.6 81.7 82.0 82.1  PLT 241 204 202 206 219   Cardiac Enzymes:  Recent Labs Lab 03/23/14 0257 03/24/14 1550 03/24/14 1928  03/25/14 0120  TROPONINI <0.30 <0.30 <0.30 <0.30   BNP (last 3 results)  Recent Labs  03/23/14 0257 03/26/14 0320  PROBNP 1031.0* 1174.0*   CBG:  Recent Labs Lab 03/27/14 0757 03/27/14 1137 03/27/14 1702 03/27/14 2135 03/28/14 0615  GLUCAP 123* 150* 106* 128* 143*    Recent Results (from the past 240 hour(s))  CULTURE, BLOOD (ROUTINE X 2)     Status: None   Collection Time    03/23/14  3:16 AM      Result Value Ref Range Status   Specimen Description BLOOD LEFT HAND DRAWN BY RN   Final   Special Requests     Final   Value: BOTTLES DRAWN AEROBIC AND ANAEROBIC 6CC EACH BOTTLE   Culture NO GROWTH 4 DAYS   Final   Report Status PENDING   Incomplete  CULTURE, BLOOD (ROUTINE X 2)     Status: None   Collection Time    03/23/14  3:21 AM      Result Value Ref Range Status   Specimen Description BLOOD LEFT HAND   Final   Special Requests     Final   Value: BOTTLES DRAWN AEROBIC AND ANAEROBIC 6CC EACH BOTTLE   Culture NO GROWTH 4 DAYS   Final   Report Status PENDING   Incomplete  MRSA PCR SCREENING     Status: None   Collection Time    03/23/14  6:39 AM      Result Value Ref Range Status   MRSA by PCR NEGATIVE  NEGATIVE Final   Comment:            The GeneXpert MRSA Assay (FDA     approved for NASAL specimens     only), is one component of a     comprehensive MRSA colonization     surveillance program. It is not     intended to diagnose MRSA     infection nor to guide or     monitor treatment for     MRSA infections.     Studies: Ct Abdomen Pelvis Wo Contrast  03/26/2014   CLINICAL DATA:  Left lower quadrant abdominal pain.  EXAM: CT ABDOMEN AND PELVIS WITHOUT CONTRAST  TECHNIQUE: Multidetector CT imaging of the abdomen and pelvis was performed following the standard protocol without IV contrast.  COMPARISON:  CT scan of July 08, 2012.  FINDINGS: Mild bilateral pleural effusions are noted. Right posterior basilar opacity is noted with air bronchograms concerning  for pneumonia. Probable avascular necrosis is noted in the left femoral head.  No gallstones are noted. Status post splenectomy. Stable splenule seen in left upper quadrant. No focal abnormality seen in the liver or pancreas on these  unenhanced images. Adrenal glands and kidneys appear normal. No hydronephrosis or renal obstruction is noted. There is no evidence of bowel obstruction. Urinary bladder appears normal. Calcified degenerating fibroid is noted in the uterus. No abnormal fluid collection is noted. The appendix appears normal. 1.5 cm rounded density is seen within the lumen of the sigmoid colon on image 60 which may represent stool, but mass or polyp cannot be excluded.  IMPRESSION: Mild bilateral pleural effusions are noted with right posterior basilar opacity noted concerning for pneumonia.  No hydronephrosis or renal obstruction is noted.  No evidence of bowel obstruction or inflammation.  1.5 cm rounded density seen within the sigmoid colon which may represent residual stool, but mass or or polyp cannot be excluded. Further evaluation with sigmoidoscopy or barium enema is recommended.   Electronically Signed   By: Sabino Dick M.D.   On: 03/26/2014 13:35    Scheduled Meds: . aspirin  81 mg Oral Daily  . atorvastatin  80 mg Oral q1800  . carvedilol  3.125 mg Oral BID WC  . docusate sodium  100 mg Oral BID  . enoxaparin (LOVENOX) injection  40 mg Subcutaneous Q24H  . furosemide  40 mg Oral BID  . insulin aspart  0-15 Units Subcutaneous TID WC  . insulin aspart  0-5 Units Subcutaneous QHS  . insulin aspart  4 Units Subcutaneous TID WC  . insulin glargine  20 Units Subcutaneous QHS  . lisinopril  2.5 mg Oral Daily  . magnesium hydroxide  30 mL Oral Once  . pantoprazole  40 mg Oral Q1200  . sodium chloride  3 mL Intravenous Q12H   Continuous Infusions: . sodium chloride 10 mL/hr at 03/28/14 0622  . sodium chloride       Charlynne Cousins  [facility] Pager [phone]. If  8PM-8AM, please contact night-coverage at [email or web address], password [facility] 03/28/2014, 7:33 AM  LOS: 5 days    **Disclaimer: This note may have been dictated with voice recognition software. Similar sounding words can inadvertently be transcribed and this note may contain transcription errors which may not have been corrected upon publication of note.**

## 2014-03-29 DIAGNOSIS — I5041 Acute combined systolic (congestive) and diastolic (congestive) heart failure: Secondary | ICD-10-CM | POA: Diagnosis not present

## 2014-03-29 DIAGNOSIS — J9600 Acute respiratory failure, unspecified whether with hypoxia or hypercapnia: Secondary | ICD-10-CM | POA: Diagnosis not present

## 2014-03-29 DIAGNOSIS — I428 Other cardiomyopathies: Secondary | ICD-10-CM | POA: Diagnosis not present

## 2014-03-29 DIAGNOSIS — I509 Heart failure, unspecified: Secondary | ICD-10-CM | POA: Diagnosis not present

## 2014-03-29 DIAGNOSIS — I10 Essential (primary) hypertension: Secondary | ICD-10-CM | POA: Diagnosis not present

## 2014-03-29 DIAGNOSIS — K5900 Constipation, unspecified: Secondary | ICD-10-CM | POA: Diagnosis not present

## 2014-03-29 DIAGNOSIS — I420 Dilated cardiomyopathy: Secondary | ICD-10-CM | POA: Diagnosis not present

## 2014-03-29 DIAGNOSIS — R570 Cardiogenic shock: Secondary | ICD-10-CM | POA: Diagnosis not present

## 2014-03-29 LAB — BASIC METABOLIC PANEL
ANION GAP: 15 (ref 5–15)
BUN: 7 mg/dL (ref 6–23)
CHLORIDE: 97 meq/L (ref 96–112)
CO2: 26 meq/L (ref 19–32)
Calcium: 9.2 mg/dL (ref 8.4–10.5)
Creatinine, Ser: 0.64 mg/dL (ref 0.50–1.10)
GFR calc non Af Amer: >90 mL/min (ref >90)
Glucose, Bld: 124 mg/dL — ABNORMAL HIGH (ref 70–99)
Potassium: 3.8 mEq/L (ref 3.7–5.3)
Sodium: 138 mEq/L (ref 137–147)

## 2014-03-29 LAB — GLUCOSE, CAPILLARY
GLUCOSE-CAPILLARY: 121 mg/dL — AB (ref 70–99)
GLUCOSE-CAPILLARY: 148 mg/dL — AB (ref 70–99)
Glucose-Capillary: 104 mg/dL — ABNORMAL HIGH (ref 70–99)
Glucose-Capillary: 117 mg/dL — ABNORMAL HIGH (ref 70–99)

## 2014-03-29 MED ORDER — CYANOCOBALAMIN 1000 MCG/ML IJ SOLN
1000.0000 ug | Freq: Every day | INTRAMUSCULAR | Status: DC
  Administered 2014-03-29 – 2014-03-31 (×3): 1000 ug via INTRAMUSCULAR
  Filled 2014-03-29 (×3): qty 1

## 2014-03-29 MED ORDER — FUROSEMIDE 10 MG/ML IJ SOLN
40.0000 mg | Freq: Once | INTRAMUSCULAR | Status: AC
  Administered 2014-03-29: 40 mg via INTRAVENOUS
  Filled 2014-03-29: qty 4

## 2014-03-29 MED ORDER — CARVEDILOL 6.25 MG PO TABS
6.2500 mg | ORAL_TABLET | Freq: Two times a day (BID) | ORAL | Status: DC
  Administered 2014-03-29 – 2014-03-31 (×4): 6.25 mg via ORAL
  Filled 2014-03-29 (×6): qty 1

## 2014-03-29 NOTE — Progress Notes (Signed)
Patient seen and examined and agree with note as outlined by Lyda Jester PA-C.  Still appears mildly volume overloaded with crackles at left base and SOB.  Agree with IV lasix today and reassess in am.  Also would ambulate in hall off O2 and check a pulse ox.  Continue ASA/BB/statin/ACE I

## 2014-03-29 NOTE — Progress Notes (Signed)
Patient Profile: 63 y/o woman with obesity, poorly controlled DM2, HTN and TTP transferred from [facility] on 7/4 for respiratory failure and presumed CHF and ? PNA.   2D echo with severe systolic dysfunction with EF 25-30% with akinesis of the entire anteroseptal myocardium + grade 3 diastolic dysfunction.   Subjective: Still mildly SOB with productive cough (white colored sputum). Breathing is worse after coughing spells. She continues to sleep with head of bed elevated. She denies CP.   Objective: Vital signs in last 24 hours: Temp:  [98.1 F (36.7 C)-98.6 F (37 C)] 98.3 F (36.8 C) (07/10 0540) Pulse Rate:  [75-96] 96 (07/10 0540) Resp:  [18-19] 18 (07/10 0540) BP: (101-118)/(58-82) 118/69 mmHg (07/10 0540) SpO2:  [91 %-99 %] 93 % (07/10 0540) Weight:  [211 lb 3.2 oz (95.8 kg)] 211 lb 3.2 oz (95.8 kg) (07/10 0540) Last BM Date: 03/27/14  Intake/Output from previous day: 07/09 0701 - 07/10 0700 In: 1200 [P.O.:1200] Out: 1400 [Urine:1400] Intake/Output this shift:    Medications Current Facility-Administered Medications  Medication Dose Route Frequency Provider Last Rate Last Dose  . 0.9 %  sodium chloride infusion   Intravenous Continuous Cherene Altes, MD 10 mL/hr at 03/28/14 2774    . acetaminophen (TYLENOL) tablet 650 mg  650 mg Oral Q4H PRN Peter M Martinique, MD      . aspirin chewable tablet 81 mg  81 mg Oral Daily Lelon Perla, MD   81 mg at 03/28/14 1346  . atorvastatin (LIPITOR) tablet 80 mg  80 mg Oral q1800 Lelon Perla, MD   80 mg at 03/28/14 1757  . carvedilol (COREG) tablet 6.25 mg  6.25 mg Oral BID WC Charlynne Cousins, MD      . cyanocobalamin ((VITAMIN B-12)) injection 1,000 mcg  1,000 mcg Intramuscular Daily Charlynne Cousins, MD      . docusate sodium (COLACE) capsule 100 mg  100 mg Oral BID Samella Parr, NP   100 mg at 03/28/14 2102  . enoxaparin (LOVENOX) injection 40 mg  40 mg Subcutaneous Q24H Peter M Martinique, MD   40 mg at 03/29/14 0810  .  furosemide (LASIX) injection 40 mg  40 mg Intravenous Once Charlynne Cousins, MD      . furosemide (LASIX) tablet 40 mg  40 mg Oral BID Lelon Perla, MD   40 mg at 03/29/14 0809  . guaiFENesin-dextromethorphan (ROBITUSSIN DM) 100-10 MG/5ML syrup 5 mL  5 mL Oral Q4H PRN Gardiner Barefoot, NP   5 mL at 03/29/14 1287  . insulin aspart (novoLOG) injection 0-15 Units  0-15 Units Subcutaneous TID WC Wilhelmina Mcardle, MD   2 Units at 03/29/14 [phone]  . insulin aspart (novoLOG) injection 0-5 Units  0-5 Units Subcutaneous QHS Wilhelmina Mcardle, MD      . insulin aspart (novoLOG) injection 4 Units  4 Units Subcutaneous TID WC Wilhelmina Mcardle, MD   4 Units at 03/29/14 [phone]  . insulin glargine (LANTUS) injection 20 Units  20 Units Subcutaneous QHS Wilhelmina Mcardle, MD   20 Units at 03/28/14 2118  . lisinopril (PRINIVIL,ZESTRIL) tablet 2.5 mg  2.5 mg Oral Daily Lelon Perla, MD   2.5 mg at 03/28/14 1347  . magnesium hydroxide (MILK OF MAGNESIA) suspension 30 mL  30 mL Oral Once Samella Parr, NP      . ondansetron [facility]) injection 4 mg  4 mg Intravenous Q6H PRN Peter M Martinique, MD      .  pantoprazole (PROTONIX) EC tablet 40 mg  40 mg Oral Q1200 Wilhelmina Mcardle, MD   40 mg at 03/28/14 1347    PE: General appearance: alert, cooperative, no distress and moderately obese Neck: mild JVD Lungs: mild rales in LLL Heart: regular rate and rhythm Extremities: no LEE Pulses: 2+ and symmetric Skin: warm and dry Neurologic: Grossly normal  Lab Results:   Recent Labs  03/27/14 0326 03/28/14 1240  WBC 8.7 6.6  HGB 10.1* 10.9*  HCT 31.6* 33.9*  PLT 219 PLATELET CLUMPS NOTED ON SMEAR, COUNT APPEARS ADEQUATE   BMET  Recent Labs  03/27/14 0326 03/28/14 0300 03/28/14 1240  NA 138 137  --   K 3.8 4.4  --   CL 98 98  --   CO2 26 27  --   GLUCOSE 124* 172*  --   BUN 8 8  --   CREATININE 0.70 0.73 0.64  CALCIUM 9.2 9.4  --     Studies/Results: [facility] 03/28/09 Procedural Findings:    Hemodynamics  RA 8/0 mean 4 mm Hg  RV 42/4 mm Hg  PA 44/14 mean 26 mm Hg  PCWP 20/28 mean 21 mm Hg  LV 98/18 mm Hg  AO 95/54 mean 72 mm Hg  Oxygen saturations:  PA 62%  AO 90%  Cardiac Output (Fick) 6.9 L/min  Cardiac Index (Fick) 3.4 L/min/meter squared  Coronary angiography:  Coronary dominance: right  Left mainstem: Normal  Left anterior descending (LAD): Diffuse disease in the proximal vessel up to 20%. 30-40% disease in the mid vessel prior to the second diagonal.  Left circumflex (LCx): 40% in OM1. 20-30% in OM2. Otherwise no significant disease.  Right coronary artery (RCA): 99% subtotal occlusion at the crux. TIMI 1 flow. Left to right collaterals to the distal vessel.  Left ventriculography: Left ventricular systolic function is abnormal, LVEF is estimated at 25%. There is inferior akinesis and severe global hypokinesis. There is mild mitral regurgitation  Final Conclusions:  1. Single vessel obstructive CAD with subtotal occlusion of the RCA at the crux.  2. Severe LV dysfunction.  3. Normal Right heart pressures. Elevated PCWP.    Assessment/Plan  Principal Problem:   Acute respiratory failure with hypoxia Active Problems:   GERD (gastroesophageal reflux disease)   Fatty liver   Uncontrolled diabetes mellitus   Gastroparesis   Diverticulitis large intestine   Acute combined systolic and diastolic CHF, NYHA class 3   Cardiogenic shock   CAP (community acquired pneumonia)   Anemia  1. A/C Combined Systolic + Diastolic HF: still with mild SOB. She has faint rales over LLL. Agree with IV Lasix order written by IM. She will get 40 mg IV today. Continue BB and ACE.   2. Cardiomyopathy: EF 25-30%. Her degree of LV dysfunction is out of proportion to her CAD. Continue medical management with ACE (lisinopril), BB (Coreg) and diuretic (Lasix). She will need further med titration as an OP. Will need f/u 3D echo after 3 months of maximum medical therapy. If EF remains <35%,  she will need to be considered for an ICD.  3. CAD: s/p [facility] revealing Single vessel obstructive CAD with subtotal occlusion of the RCA at the crux. PCI felt not indicated given lack of anginal symptoms.  Continue medical therapy: ASA, BB, ACE and statin.     LOS: 6 days    Brittainy M. Rosita Fire, PA-C 03/29/2014 9:26 AM

## 2014-03-29 NOTE — Progress Notes (Signed)
Physical Therapy Treatment Patient Details Name: [name] MRN: [id number] DOB: 1951/01/11 Today's Date: 03/29/2014    History of Present Illness 62 y.o. F admitted 03/23/14 with acute respiratory failure with hypoxia. PMHx of obesity, poorly controlled DM2, HTN, TTP, and presumed CHF.    PT Comments    Pt  progressing with mobility, ambulation without oxygen.  Follow Up Recommendations  Home health PT     Equipment Recommendations  None recommended by PT    Recommendations for Other Services       Precautions / Restrictions Precautions Precautions: Fall    Mobility  Bed Mobility Overal bed mobility: Independent                Transfers Overall transfer level: Independent                  Ambulation/Gait Ambulation/Gait assistance: Min guard;Supervision Ambulation Distance (Feet): 200 Feet Assistive device: None       General Gait Details: occassionaly reaches for rail f, cavbinet to steady self, appears less secure in open hall without UE suppot. Offered RW, pt daclined. assist,    Stairs            Wheelchair Mobility    Modified Rankin (Stroke Patients Only)       Balance           Standing balance support: No upper extremity supported;During functional activity Standing balance-Leahy Scale: Fair                      Cognition Arousal/Alertness: Awake/alert                          Exercises      General Comments        Pertinent Vitals/Pain Pre 94%, post 96% RA HR 96-101    Home Living                      Prior Function            PT Goals (current goals can now be found in the care plan section) Progress towards PT goals: Progressing toward goals    Frequency  Min 3X/week    PT Plan Current plan remains appropriate    Co-evaluation             End of Session Equipment Utilized During Treatment: Gait belt Activity Tolerance: Patient tolerated treatment  well Patient left: in bed;with call bell/phone within reach     Time: 4008-6761 PT Time Calculation (min): 12 min  Charges:  $Gait Training: 8-22 mins                    G Codes:      Claretha Cooper 03/29/2014, 3:31 PM Tresa Endo PT [phone]

## 2014-03-29 NOTE — Progress Notes (Signed)
Patient evaluated for community based chronic disease management services with [facility] as a benefit of patient's [employer]. Spoke with patient at bedside to explain [facility] services.  Patient has elected to accepted services at this time.  She expressed that she has a new diagnosis of heart failure and wants to manage it at home.  She will be going to the [facility] CHF clinic to be optimized as an outpatient.  Her daughter Curt Bears [phone] is her authorized contact.  Her family will purchase a scale prior to discharge.  Patient will receive a post discharge transition of care call and will be evaluated for monthly home visits for assessments and disease process education.  Left contact information and [facility] literature at bedside. Made Inpatient Case Manager aware that [facility] following. Of note, [facility] Care Management services does not replace or interfere with any services that are arranged by inpatient case management or social work.  For additional questions or referrals please contact Corliss Blacker BSN RN [facility] Liaison at [phone].

## 2014-03-29 NOTE — Progress Notes (Signed)
I went back and spoke with [name] and [name] regarding Christina new HF diagnosis.  They both acknowledge understanding of education given yesterday and had furthered reviewed the handouts provided about HF.  They plan to buy a scale today or tomorrow.  I have made a transition appt for [name] in the HF clinic on Thursday Thursday July 16th at 2:15p. I have also provided a map and directions to get to the clinic.  She would eventually like to establish care in [place].

## 2014-03-29 NOTE — Progress Notes (Signed)
SATURATION QUALIFICATIONS: (This note is used to comply with regulatory documentation for home oxygen)  Patient Saturations on Room Air at Rest = 93%  Patient Saturations on Room Air while Ambulating = 94%  Patient Saturations on n/a Liters of oxygen while Ambulating = n/a%  Please briefly explain why patient needs home oxygen:  Lucille Passy, OTR/L [phone]

## 2014-03-29 NOTE — Progress Notes (Signed)
0700-1900 shift. Patient is A/Ox4 and is ambulatory without assistance. She is on room air and had no c/o pain and no  Distress. PRN cough medicine given for c/o cough. Pt.radial site is a level 0. No hematoma and no bruising noted.

## 2014-03-29 NOTE — Evaluation (Signed)
Occupational Therapy Evaluation Patient Details Name: [name] MRN: [id number] DOB: 07/26/1951 Today's Date: 03/29/2014    History of Present Illness 63 y.o. F admitted 03/23/14 with acute respiratory failure with hypoxia. PMHx of obesity, poorly controlled DM2, HTN, TTP, and presumed CHF.   Clinical Impression   Patient evaluated by Occupational Therapy with no further acute OT needs identified. All education has been completed and the patient has no further questions. Currently, pt requires supervision for BADLs and should progress to modified independence without formal OT services.  Reviewed energy conservation techniques.  Encouraged pt to perform BADLs without nsg assist while in hospital and to ambulate with staff or family 3-4x/day.   See below for any follow-up Occupational Therapy or equipment needs. OT is signing off. Thank you for this referral.       Follow Up Recommendations  No OT follow up;Supervision/Assistance - 24 hour    Equipment Recommendations  None recommended by OT    Recommendations for Other Services       Precautions / Restrictions Precautions Precautions: Fall      Mobility Bed Mobility Overal bed mobility: Independent                Transfers Overall transfer level: Independent                    Balance           Standing balance support: No upper extremity supported Standing balance-Leahy Scale: Fair                              ADL Overall ADL's : Needs assistance/impaired Eating/Feeding: Independent   Grooming: Wash/dry hands;Wash/dry face;Oral care;Applying deodorant;Brushing hair;Supervision/safety;Standing   Upper Body Bathing: Set up;Sitting   Lower Body Bathing: Supervison/ safety;Sit to/from stand   Upper Body Dressing : Set up;Sitting   Lower Body Dressing: Supervision/safety;Sit to/from stand   Toilet Transfer: Supervision/safety;Ambulation   Toileting- Clothing Manipulation and  Hygiene: Supervision/safety;Sit to/from stand   Tub/ Shower Transfer: Supervision/safety;Ambulation   Functional mobility during ADLs: Supervision/safety General ADL Comments: Pt is able to perform BADLs at supervision level.  02 sat on RA 93-94% throughout.  Pt reports she has been performing her BADLs withoug assist the last 1-2 days.  Encouraged her to continue this and to walk with nsg staff or family 3-4x/day. Reviewed energy conservation techniques   She verbalized understanding     Vision                     Perception     Praxis      Pertinent Vitals/Pain Denies pain.  02 sats remained 93-94% on RA throughout session      Hand Dominance Right   Extremity/Trunk Assessment Upper Extremity Assessment Upper Extremity Assessment: Generalized weakness   Lower Extremity Assessment Lower Extremity Assessment: Defer to PT evaluation   Cervical / Trunk Assessment Cervical / Trunk Assessment: Normal   Communication Communication Communication: No difficulties   Cognition Arousal/Alertness: Awake/alert Behavior During Therapy: WFL for tasks assessed/performed Overall Cognitive Status: Within Functional Limits for tasks assessed                     General Comments       Exercises       Shoulder Instructions      Home Living Family/patient expects to be discharged to:: Private residence Living Arrangements: Children Available Help at Discharge: Available  24 hours/day Type of Home: House Home Access: Level entry     Home Layout: One level     Bathroom Shower/Tub: Tub/shower unit;Curtain Shower/tub characteristics: Architectural technologist: Standard         Additional Comments: 65 and 81 y.o. grandsons to help      Prior Functioning/Environment Level of Independence: Independent        Comments: Does not drive    OT Diagnosis:     OT Problem List:     OT Treatment/Interventions:      OT Goals(Current goals can be found in the  care plan section)    OT Frequency:     Barriers to D/C:            Co-evaluation              End of Session    Activity Tolerance: Patient tolerated treatment well Patient left: in chair;with call bell/phone within reach   Time: 1701-1717 OT Time Calculation (min): 16 min Charges:  OT General Charges $OT Visit: 1 Procedure OT Evaluation $Initial OT Evaluation Tier I: 1 Procedure OT Treatments $Self Care/Home Management : 8-22 mins G-Codes:    Yassin Scales M 2014-04-03, 5:34 PM

## 2014-03-29 NOTE — Plan of Care (Signed)
Problem: Food- and Nutrition-Related Knowledge Deficit (NB-1.1) Goal: Nutrition education Formal process to instruct or train a patient/client in a skill or to impart knowledge to help patients/clients voluntarily manage or modify food choices and eating behavior to maintain or improve health. Outcome: Adequate for Discharge Nutrition Education Note    Lab Results  Component Value Date    HGBA1C 12.3* 02/13/2014    RD consulted for nutrition education regarding new onset CHF and diabetes.  Pt reports she has struggled with diabetes for many years. She reports she is perplexed as to why her blood sugar levels are so high, especially given the way she eats. Pt reports she usually eats 2 meals per day- "brunch" around 10-11 AM which consists of a bowl of tomato soup and a meat, starch, and vegetable at dinner. She reports she dislikes diet sodas and has transitioned to water and coffee as beverages. She reports she is also working on eating more frequently, as her PCP suggested to her to eat smaller, more frequent meals to more optimally control blood sugar. Discussed examples of appropriate snacks.   RD provided "Low Sodium Nutrition Therapy" handout from the Academy of [employer]. Reviewed patient's dietary recall. Provided examples on ways to decrease sodium intake in diet. Discouraged intake of processed foods and use of salt shaker. Encouraged fresh fruits and vegetables as well as whole grain sources of carbohydrates to maximize fiber intake.   RD discussed why it is important for patient to adhere to diet recommendations, and emphasized the role of fluids, foods to avoid, and importance of weighing self daily.   RD provided "Carbohydrate Counting for People with Diabetes" handout from the Academy of Nutrition and [employer]etics. Discussed different food groups and their effects on blood sugar, emphasizing carbohydrate-containing foods. Provided list of carbohydrates and recommended  serving sizes of common foods.  Discussed importance of controlled and consistent carbohydrate intake throughout the day. Provided examples of ways to balance meals/snacks and encouraged intake of high-fiber, whole grain complex carbohydrates. Teach back method used.  Body mass index is 36.23 kg/(m^2). Pt meets criteria for obesity, class II based on current BMI.  Current diet order is Heart Healthy/ Carb Modified, patient is consuming approximately 75-100% of meals at this time. Labs and medications reviewed. No further nutrition interventions warranted at this time. RD contact information provided. If additional nutrition issues arise, please re-consult RD.   Lyna Laningham A. Jimmye Norman, RD, LDN Pager: [phone]

## 2014-03-29 NOTE — Progress Notes (Addendum)
[facility] PROGRESS NOTE Interim History: 63 y/o woman with obesity, poorly controlled DM2, HTN and TTP transferred from [facility] on 7/4 for respiratory failure and presumed CHF. She was intubated and later transferred to [facility]. Pt was in cardiogenic shock. ECHO revealed significant systolic dysfunction and grade 3 DD with RWMA concerning for ischemic etiology  Assessment/Plan: Acute respiratory failure with hypoxia/  Acute combined systolic and diastolic CHF (congestive heart failure), NYHA class 3: - Unlikely PNA. Marland Kitchen - cont diuretics per cards, echo as below with wall motion. - cath on 7.9.2015. Weight is improving 95.8 kg will cont. Diurese,  - cont Coreg, ACE-I and lasix.  Anemia  -? Etiology  - b12 low, supplement, keep Hbg > 9.0 -TSH Normal  Cardiogenic shock  -BP soft but hemodynamically stable  Uncontrolled diabetes mellitus  - HgbA1c 12.3  - per pt report and office notes CBG avg 80-200  - was on Lantus and Humalog pre admit-cont Lantus and SSI  ? Diverticulitis large intestine - initially dx'd 6/25 and was started on Cipro/Flagyl  - CT abdomen/pelvis 1.5 cm rounded density seen within the sigmoid colon which may represent residual stool, but mass or or polyp cannot be excluded. H/o chronic constipation started on miralax with multiple hard stools. - will have to go home on stool softener.  HTN  -cont Coreg and ACE-I   Code Status: full Family Communication: none  Disposition Plan: inpatient   Consultants:  Cardiology  PCCM  Procedures: Echo 7.7.2015: ejection fraction was in the range of 25% to 30%. There is akinesis of the entireanteroseptal myocardium. Doppler parameters are consistent with a reversible restrictive pattern, indicative of decreased left ventricular diastolic compliance and/or increased left atrial pressure (grade 3 diastolic dysfunction). Cath 7.9.2015:Single vessel obstructive CAD with subtotal occlusion of the RCA at the crux. Severe  LV dysfunction. Normal Right heart pressures. Elevated PCWP   Antibiotics:  none   HPI/Subjective: No complains  Objective: Filed Vitals:   03/28/14 1659 03/28/14 2115 03/29/14 0140 03/29/14 0540  BP: 102/82 106/65 106/58 118/69  Pulse: 90 95 94 96  Temp:  98.1 F (36.7 C) 98.2 F (36.8 C) 98.3 F (36.8 C)  TempSrc:  Oral Oral Oral  Resp:  18 18 18   Height:      Weight:    95.8 kg (211 lb 3.2 oz)  SpO2: 91% 91% 92% 93%    Intake/Output Summary (Last 24 hours) at 03/29/14 0840 Last data filed at 03/29/14 0545  Gross per 24 hour  Intake   1200 ml  Output   1400 ml  Net   -200 ml   Filed Weights   03/27/14 1000 03/28/14 0619 03/29/14 0540  Weight: 96.163 kg (212 lb) 96.072 kg (211 lb 12.8 oz) 95.8 kg (211 lb 3.2 oz)    Exam:  General: Alert, awake, oriented x3, in no acute distress.  HEENT: No bruits, no goiter.  Heart: Regular rate and rhythm. Lungs: Good air movement, clear Abdomen: Soft, nontender, nondistended, positive bowel sounds.    Data Reviewed: Basic Metabolic Panel:  Recent Labs Lab 03/24/14 0300 03/24/14 1700 03/25/14 0120 03/26/14 0320 03/27/14 0326 03/28/14 0300 03/28/14 1240  NA 138 137 137 137 138 137  --   K 3.4* 3.8 3.4* 4.2 3.8 4.4  --   CL 101 100 98 97 98 98  --   CO2 23 21 25 26 26 27   --   GLUCOSE 125* 112* 111* 170* 124* 172*  --   BUN 8  7 6 9 8 8   --   CREATININE 0.61 0.64 0.69 0.75 0.70 0.73 0.64  CALCIUM 8.1* 8.2* 8.4 8.6 9.2 9.4  --   MG  --  1.8  --   --   --   --   --    Liver Function Tests:  Recent Labs Lab 03/23/14 0316 03/27/14 0326  AST 25 14  ALT 18 12  ALKPHOS 60 53  BILITOT 0.3 0.4  PROT 6.7 7.1  ALBUMIN 2.8* 3.0*   No results found for this basename: LIPASE, AMYLASE,  in the last 168 hours No results found for this basename: AMMONIA,  in the last 168 hours CBC:  Recent Labs Lab 03/24/14 0300 03/24/14 1137 03/26/14 0320 03/27/14 0326 03/28/14 1240  WBC 9.8 11.7* 8.6 8.7 6.6  NEUTROABS  --   9.2*  --  5.3  --   HGB 10.4* 10.5* 10.4* 10.1* 10.9*  HCT 32.4* 32.5* 31.9* 31.6* 33.9*  MCV 81.6 81.7 82.0 82.1 82.9  PLT 204 202 206 219 PLATELET CLUMPS NOTED ON SMEAR, COUNT APPEARS ADEQUATE   Cardiac Enzymes:  Recent Labs Lab 03/23/14 0257 03/24/14 1550 03/24/14 1928 03/25/14 0120  TROPONINI <0.30 <0.30 <0.30 <0.30   BNP (last 3 results)  Recent Labs  03/23/14 0257 03/26/14 0320  PROBNP 1031.0* 1174.0*   CBG:  Recent Labs Lab 03/28/14 0921 03/28/14 1120 03/28/14 1642 03/28/14 2110 03/29/14 0611  GLUCAP 124* 116* 123* 103* 121*    Recent Results (from the past 240 hour(s))  CULTURE, BLOOD (ROUTINE X 2)     Status: None   Collection Time    03/23/14  3:16 AM      Result Value Ref Range Status   Specimen Description BLOOD LEFT HAND DRAWN BY RN   Final   Special Requests     Final   Value: BOTTLES DRAWN AEROBIC AND ANAEROBIC 6CC EACH BOTTLE   Culture NO GROWTH 5 DAYS   Final   Report Status 03/28/2014 FINAL   Final  CULTURE, BLOOD (ROUTINE X 2)     Status: None   Collection Time    03/23/14  3:21 AM      Result Value Ref Range Status   Specimen Description BLOOD LEFT HAND   Final   Special Requests     Final   Value: BOTTLES DRAWN AEROBIC AND ANAEROBIC 6CC EACH BOTTLE   Culture NO GROWTH 5 DAYS   Final   Report Status 03/28/2014 FINAL   Final  MRSA PCR SCREENING     Status: None   Collection Time    03/23/14  6:39 AM      Result Value Ref Range Status   MRSA by PCR NEGATIVE  NEGATIVE Final   Comment:            The GeneXpert MRSA Assay (FDA     approved for NASAL specimens     only), is one component of a     comprehensive MRSA colonization     surveillance program. It is not     intended to diagnose MRSA     infection nor to guide or     monitor treatment for     MRSA infections.     Studies: No results found.  Scheduled Meds: . aspirin  81 mg Oral Daily  . atorvastatin  80 mg Oral q1800  . carvedilol  3.125 mg Oral BID WC  . docusate  sodium  100 mg Oral BID  . enoxaparin (LOVENOX) injection  40 mg Subcutaneous Q24H  . furosemide  40 mg Intravenous Once  . furosemide  40 mg Oral BID  . insulin aspart  0-15 Units Subcutaneous TID WC  . insulin aspart  0-5 Units Subcutaneous QHS  . insulin aspart  4 Units Subcutaneous TID WC  . insulin glargine  20 Units Subcutaneous QHS  . lisinopril  2.5 mg Oral Daily  . magnesium hydroxide  30 mL Oral Once  . pantoprazole  40 mg Oral Q1200  . polyethylene glycol  17 g Oral BID   Continuous Infusions: . sodium chloride 10 mL/hr at 03/28/14 0622     Charlynne Cousins  [facility] Pager [phone]. If 8PM-8AM, please contact night-coverage at [email or web address], password [facility] 03/29/2014, 8:40 AM  LOS: 6 days    **Disclaimer: This note may have been dictated with voice recognition software. Similar sounding words can inadvertently be transcribed and this note may contain transcription errors which may not have been corrected upon publication of note.**

## 2014-03-30 DIAGNOSIS — I509 Heart failure, unspecified: Secondary | ICD-10-CM | POA: Diagnosis not present

## 2014-03-30 DIAGNOSIS — I5021 Acute systolic (congestive) heart failure: Secondary | ICD-10-CM | POA: Diagnosis not present

## 2014-03-30 DIAGNOSIS — I5041 Acute combined systolic (congestive) and diastolic (congestive) heart failure: Secondary | ICD-10-CM | POA: Diagnosis not present

## 2014-03-30 DIAGNOSIS — J9600 Acute respiratory failure, unspecified whether with hypoxia or hypercapnia: Secondary | ICD-10-CM | POA: Diagnosis not present

## 2014-03-30 DIAGNOSIS — E1165 Type 2 diabetes mellitus with hyperglycemia: Secondary | ICD-10-CM | POA: Diagnosis not present

## 2014-03-30 LAB — GLUCOSE, CAPILLARY
GLUCOSE-CAPILLARY: 130 mg/dL — AB (ref 70–99)
Glucose-Capillary: 159 mg/dL — ABNORMAL HIGH (ref 70–99)
Glucose-Capillary: 211 mg/dL — ABNORMAL HIGH (ref 70–99)
Glucose-Capillary: 167 mg/dL — ABNORMAL HIGH (ref 70–99)

## 2014-03-30 NOTE — Progress Notes (Signed)
Subjective:  She is still coughing but is not really having shortness of breath. No chest pain.  Objective:  Vital Signs in the last 24 hours: BP 115/68  Pulse 95  Temp(Src) 98.8 F (37.1 C) (Oral)  Resp 18  Ht 5\' 4"  (1.626 m)  Wt 94.666 kg (208 lb 11.2 oz)  BMI 35.81 kg/m2  SpO2 94%  Physical Exam: Pleasant obese black female in no acute distress Lungs:  Mild rhonchi and rales particularly on the left side Cardiac:  Regular rhythm, normal S1 and S2, no S3 Abdomen:  Soft, nontender, no masses Extremities:  No edema present  Intake/Output from previous day: 07/10 0701 - 07/11 0700 In: 1060 [P.O.:1060] Out: 2402 [Urine:2401; Stool:1] Weight Filed Weights   03/28/14 0619 03/29/14 0540 03/30/14 0605  Weight: 96.072 kg (211 lb 12.8 oz) 95.8 kg (211 lb 3.2 oz) 94.666 kg (208 lb 11.2 oz)    Lab Results: Basic Metabolic Panel:  Recent Labs  03/28/14 0300 03/28/14 1240 03/29/14 1045  NA 137  --  138  K 4.4  --  3.8  CL 98  --  97  CO2 27  --  26  GLUCOSE 172*  --  124*  BUN 8  --  7  CREATININE 0.73 0.64 0.64    CBC:  Recent Labs  03/28/14 1240  WBC 6.6  HGB 10.9*  HCT 33.9*  MCV 82.9  PLT PLATELET CLUMPS NOTED ON SMEAR, COUNT APPEARS ADEQUATE    BNP    Component Value Date/Time   PROBNP 1174.0* 03/26/2014 0320   Telemetry: Sinus rhythm  Assessment/Plan:  1. Acute on chronic systolic heart failure he continues to diurese adequately 2. Coronary artery disease treated medically 3. Uncontrolled diabetes 4. Hypertensive heart disease  Recommendations:  Continue IV diuresis today and consider change to oral tomorrow      W. Doristine Church  MD [facility] Cardiology  03/30/2014, 9:43 AM

## 2014-03-30 NOTE — Progress Notes (Signed)
[facility] PROGRESS NOTE Interim History: 63 y/o woman with obesity, poorly controlled DM2, HTN and TTP transferred from [facility] on 7/4 for respiratory failure and presumed CHF. She was intubated and later transferred to [facility]. Pt was in cardiogenic shock. ECHO revealed significant systolic dysfunction and grade 3 DD with RWMA concerning for ischemic etiology  Assessment/Plan: Acute respiratory failure with hypoxia/ Acute combined systolic and diastolic CHF (congestive heart failure), NYHA class 3: - Unlikely PNA. . - Cont diuretics, ECHO as below with wall motion. - Cath on 7.9.2015. Weight is improving 95.8 kg will cont. Diurese with oral lasix. - Cont Coreg, ACE-I and lasix.  Anemia: -? Etiology  - b12 low, supplement. -TSH Normal  Cardiogenic shock: -BP soft but hemodynamically stable  Uncontrolled diabetes mellitus: - HgbA1c 12.3. - per pt report and office notes CBG avg 80-200. - Was on Lantus and Humalog pre admit-cont Lantus and SSI  ? Diverticulitis large intestine: - initially dx'd 6/25 and was started on Cipro/Flagyl  - CT abdomen/pelvis 1.5 cm rounded density seen within the sigmoid colon which may represent residual stool, but mass or or polyp cannot be excluded. H/o chronic constipation started on miralax with multiple hard stools. - will have to go home on stool softener.  HTN: - Cont Coreg and ACE-I.   Code Status: full Family Communication: none  Disposition Plan: inpatient   Consultants:  Cardiology  PCCM  Procedures: - Echo 7.7.2015: ejection fraction was in the range of 25% to 30%. There is akinesis of the entireanteroseptal myocardium. - Doppler parameters are consistent with a reversible restrictive pattern, indicative of decreased left ventricular diastolic compliance and/or increased left atrial pressure (grade 3 diastolic dysfunction). - Cath 7.9.2015:Single vessel obstructive CAD with subtotal occlusion of the RCA at the crux. Severe LV  dysfunction. Normal Right heart pressures. Elevated PCWP.   Antibiotics:  none   HPI/Subjective: No complains, want to go home. NO SOB or CP.  Objective: Filed Vitals:   03/29/14 1127 03/29/14 1439 03/29/14 2143 03/30/14 0605  BP: 110/70 111/69 103/48 115/68  Pulse: 95 95 90 95  Temp: 98.3 F (36.8 C) 98.6 F (37 C) 98.5 F (36.9 C) 98.8 F (37.1 C)  TempSrc: Oral Oral Oral Oral  Resp: 18 18 18 18   Height:      Weight:    94.666 kg (208 lb 11.2 oz)  SpO2: 95% 94% 97% 94%    Intake/Output Summary (Last 24 hours) at 03/30/14 0948 Last data filed at 03/30/14 0900  Gross per 24 hour  Intake   1180 ml  Output   2401 ml  Net  -1221 ml   Filed Weights   03/28/14 0619 03/29/14 0540 03/30/14 0605  Weight: 96.072 kg (211 lb 12.8 oz) 95.8 kg (211 lb 3.2 oz) 94.666 kg (208 lb 11.2 oz)    Exam:  General: Alert, awake, oriented x3, in no acute distress.  HEENT: No bruits, no goiter. - JVD Heart: Regular rate and rhythm. Lungs: Good air movement, clear Abdomen: Soft, nontender, nondistended, positive bowel sounds.    Data Reviewed: Basic Metabolic Panel:  Recent Labs Lab 03/24/14 0300 03/24/14 1700 03/25/14 0120 03/26/14 0320 03/27/14 0326 03/28/14 0300 03/28/14 1240 03/29/14 1045  NA 138 137 137 137 138 137  --  138  K 3.4* 3.8 3.4* 4.2 3.8 4.4  --  3.8  CL 101 100 98 97 98 98  --  97  CO2 23 21 25 26 26 27   --  26  GLUCOSE  125* 112* 111* 170* 124* 172*  --  124*  BUN 8 7 6 9 8 8   --  7  CREATININE 0.61 0.64 0.69 0.75 0.70 0.73 0.64 0.64  CALCIUM 8.1* 8.2* 8.4 8.6 9.2 9.4  --  9.2  MG  --  1.8  --   --   --   --   --   --    Liver Function Tests:  Recent Labs Lab 03/27/14 0326  AST 14  ALT 12  ALKPHOS 53  BILITOT 0.4  PROT 7.1  ALBUMIN 3.0*   No results found for this basename: LIPASE, AMYLASE,  in the last 168 hours No results found for this basename: AMMONIA,  in the last 168 hours CBC:  Recent Labs Lab 03/24/14 0300 03/24/14 1137  03/26/14 0320 03/27/14 0326 03/28/14 1240  WBC 9.8 11.7* 8.6 8.7 6.6  NEUTROABS  --  9.2*  --  5.3  --   HGB 10.4* 10.5* 10.4* 10.1* 10.9*  HCT 32.4* 32.5* 31.9* 31.6* 33.9*  MCV 81.6 81.7 82.0 82.1 82.9  PLT 204 202 206 219 PLATELET CLUMPS NOTED ON SMEAR, COUNT APPEARS ADEQUATE   Cardiac Enzymes:  Recent Labs Lab 03/24/14 1550 03/24/14 1928 03/25/14 0120  TROPONINI <0.30 <0.30 <0.30   BNP (last 3 results)  Recent Labs  03/23/14 0257 03/26/14 0320  PROBNP 1031.0* 1174.0*   CBG:  Recent Labs Lab 03/29/14 0611 03/29/14 1117 03/29/14 1606 03/29/14 2107 03/30/14 0553  GLUCAP 121* 104* 148* 117* 159*    Recent Results (from the past 240 hour(s))  CULTURE, BLOOD (ROUTINE X 2)     Status: None   Collection Time    03/23/14  3:16 AM      Result Value Ref Range Status   Specimen Description BLOOD LEFT HAND DRAWN BY RN   Final   Special Requests     Final   Value: BOTTLES DRAWN AEROBIC AND ANAEROBIC 6CC EACH BOTTLE   Culture NO GROWTH 5 DAYS   Final   Report Status 03/28/2014 FINAL   Final  CULTURE, BLOOD (ROUTINE X 2)     Status: None   Collection Time    03/23/14  3:21 AM      Result Value Ref Range Status   Specimen Description BLOOD LEFT HAND   Final   Special Requests     Final   Value: BOTTLES DRAWN AEROBIC AND ANAEROBIC 6CC EACH BOTTLE   Culture NO GROWTH 5 DAYS   Final   Report Status 03/28/2014 FINAL   Final  MRSA PCR SCREENING     Status: None   Collection Time    03/23/14  6:39 AM      Result Value Ref Range Status   MRSA by PCR NEGATIVE  NEGATIVE Final   Comment:            The GeneXpert MRSA Assay (FDA     approved for NASAL specimens     only), is one component of a     comprehensive MRSA colonization     surveillance program. It is not     intended to diagnose MRSA     infection nor to guide or     monitor treatment for     MRSA infections.     Studies: No results found.  Scheduled Meds: . aspirin  81 mg Oral Daily  . atorvastatin   80 mg Oral q1800  . carvedilol  6.25 mg Oral BID WC  . cyanocobalamin  1,000 mcg  Intramuscular Daily  . docusate sodium  100 mg Oral BID  . enoxaparin (LOVENOX) injection  40 mg Subcutaneous Q24H  . furosemide  40 mg Oral BID  . insulin aspart  0-15 Units Subcutaneous TID WC  . insulin aspart  0-5 Units Subcutaneous QHS  . insulin aspart  4 Units Subcutaneous TID WC  . insulin glargine  20 Units Subcutaneous QHS  . lisinopril  2.5 mg Oral Daily  . magnesium hydroxide  30 mL Oral Once  . pantoprazole  40 mg Oral Q1200   Continuous Infusions: . sodium chloride 10 mL/hr at 03/28/14 0622     Charlynne Cousins  [facility] Pager [phone].  If 8PM-8AM, please contact night-coverage at [email or web address], password [facility] 03/30/2014, 9:48 AM  LOS: 7 days    **Disclaimer: This note may have been dictated with voice recognition software. Similar sounding words can inadvertently be transcribed and this note may contain transcription errors which may not have been corrected upon publication of note.**

## 2014-03-31 DIAGNOSIS — I5022 Chronic systolic (congestive) heart failure: Secondary | ICD-10-CM | POA: Insufficient documentation

## 2014-03-31 DIAGNOSIS — I5021 Acute systolic (congestive) heart failure: Secondary | ICD-10-CM | POA: Diagnosis not present

## 2014-03-31 DIAGNOSIS — E1140 Type 2 diabetes mellitus with diabetic neuropathy, unspecified: Secondary | ICD-10-CM

## 2014-03-31 DIAGNOSIS — K5733 Diverticulitis of large intestine without perforation or abscess with bleeding: Secondary | ICD-10-CM | POA: Diagnosis not present

## 2014-03-31 DIAGNOSIS — I2510 Atherosclerotic heart disease of native coronary artery without angina pectoris: Secondary | ICD-10-CM | POA: Diagnosis present

## 2014-03-31 DIAGNOSIS — J9600 Acute respiratory failure, unspecified whether with hypoxia or hypercapnia: Secondary | ICD-10-CM | POA: Diagnosis not present

## 2014-03-31 DIAGNOSIS — R570 Cardiogenic shock: Secondary | ICD-10-CM | POA: Diagnosis not present

## 2014-03-31 DIAGNOSIS — I5041 Acute combined systolic (congestive) and diastolic (congestive) heart failure: Secondary | ICD-10-CM | POA: Diagnosis not present

## 2014-03-31 LAB — GLUCOSE, CAPILLARY
GLUCOSE-CAPILLARY: 169 mg/dL — AB (ref 70–99)
GLUCOSE-CAPILLARY: 196 mg/dL — AB (ref 70–99)

## 2014-03-31 LAB — BASIC METABOLIC PANEL
ANION GAP: 19 — AB (ref 5–15)
BUN: 10 mg/dL (ref 6–23)
CHLORIDE: 95 meq/L — AB (ref 96–112)
CO2: 23 mEq/L (ref 19–32)
Calcium: 9.2 mg/dL (ref 8.4–10.5)
Creatinine, Ser: 0.6 mg/dL (ref 0.50–1.10)
GFR calc Af Amer: >90 mL/min (ref >90)
Glucose, Bld: 208 mg/dL — ABNORMAL HIGH (ref 70–99)
Potassium: 4.1 mEq/L (ref 3.7–5.3)
SODIUM: 137 meq/L (ref 137–147)

## 2014-03-31 MED ORDER — FUROSEMIDE 40 MG PO TABS: 40.0000 mg | ORAL_TABLET | Freq: Two times a day (BID) | ORAL | Status: DC

## 2014-03-31 MED ORDER — ATORVASTATIN CALCIUM 80 MG PO TABS: 80.0000 mg | ORAL_TABLET | Freq: Every day | ORAL | Status: DC

## 2014-03-31 MED ORDER — CARVEDILOL 6.25 MG PO TABS: 6.2500 mg | ORAL_TABLET | Freq: Two times a day (BID) | ORAL | Status: DC

## 2014-03-31 MED ORDER — LISINOPRIL 2.5 MG PO TABS: 2.5000 mg | ORAL_TABLET | Freq: Every day | ORAL | Status: DC

## 2014-03-31 MED ORDER — ASPIRIN 81 MG PO CHEW: 81.0000 mg | CHEWABLE_TABLET | Freq: Every day | ORAL | Status: DC

## 2014-03-31 NOTE — Progress Notes (Signed)
Subjective:  Feeling much better today. Less cough. Not short of breath.  Objective:  Vital Signs in the last 24 hours: BP 114/68  Pulse 93  Temp(Src) 98.2 F (36.8 C) (Oral)  Resp 19  Ht 5\' 4"  (1.626 m)  Wt 96.253 kg (212 lb 3.2 oz)  BMI 36.41 kg/m2  SpO2 96%  Physical Exam: Pleasant obese black female in no acute distress Lungs:  Clear today  Cardiac:  Regular rhythm, normal S1 and S2, no S3 Abdomen:  Soft, nontender, no masses Extremities:  No edema present  Intake/Output from previous day: 07/11 0701 - 07/12 0700 In: 1080 [P.O.:1080] Out: 1950 [Urine:1950] Weight Filed Weights   03/29/14 0540 03/30/14 0605 03/31/14 0439  Weight: 95.8 kg (211 lb 3.2 oz) 94.666 kg (208 lb 11.2 oz) 96.253 kg (212 lb 3.2 oz)    Lab Results: Basic Metabolic Panel:  Recent Labs  03/28/14 1240 03/29/14 1045  NA  --  138  K  --  3.8  CL  --  97  CO2  --  26  GLUCOSE  --  124*  BUN  --  7  CREATININE 0.64 0.64    CBC:  Recent Labs  03/28/14 1240  WBC 6.6  HGB 10.9*  HCT 33.9*  MCV 82.9  PLT PLATELET CLUMPS NOTED ON SMEAR, COUNT APPEARS ADEQUATE    BNP    Component Value Date/Time   PROBNP 1174.0* 03/26/2014 0320   Telemetry: Sinus rhythm  Assessment/Plan:  1. Acute on chronic systolic heart failure probably at baseline  2. Coronary artery disease treated medically 3. Uncontrolled diabetes 4. Hypertensive heart disease  Recommendations:  Her weight is down and I think it is resolved her to be discharged. She should have early cardiology followup in [place] which is where she lives.      Kerry Hough  MD [facility] Cardiology  03/31/2014, 10:54 AM

## 2014-03-31 NOTE — Discharge Summary (Signed)
Physician Discharge Summary  [name] OTL:[id number] DOB: July 26, 1951 DOA: 03/23/2014  PCP: Alonza Bogus, MD  Admit date: 03/23/2014 Discharge date: 03/31/2014  Time spent: 40 minutes  Recommendations for Outpatient Follow-up:  1. Follow up with Cardiologist in 1 week. b-met in 1 week. 2. Titrate Bp meds as tolereta it.  BNP    Component Value Date/Time   PROBNP 1174.0* 03/26/2014 0320   Filed Weights   03/29/14 0540 03/30/14 0605 03/31/14 0439  Weight: 95.8 kg (211 lb 3.2 oz) 94.666 kg (208 lb 11.2 oz) 96.253 kg (212 lb 3.2 oz)     Discharge Diagnoses:  Principal Problem:   Acute respiratory failure with hypoxia Active Problems:   Acute combined systolic and diastolic CHF, NYHA class 3   GERD (gastroesophageal reflux disease)   Fatty liver   Uncontrolled diabetes mellitus   Gastroparesis   Diverticulitis large intestine   Cardiogenic shock   CAP (community acquired pneumonia)   Anemia   DCM (dilated cardiomyopathy)   Discharge Condition: stable  Diet recommendation: low sodium    History of present illness:  63 y/o woman with obesity, poorly controlled DM2, HTN and TTP transferred from [facility] on 7/4 for respiratory failure and presumed CHF.   Hospital Course:  Acute respiratory failure with hypoxia/ Acute combined systolic and diastolic CHF (congestive heart failure), NYHA class 3:  - Unlikely PNA. Admitted PCCM and cardiology started to diureses pt. With improvement of her symptoms. Extubated after diuresis. - ECHO as below with wall motion.  - Cath on 7.9.2015 as below cardiology recommended medical management. Weight is improving 95.8 kg will cont. Cont on oral lasix at home. - Cont Coreg, ACE-I and lasix.  - follow up with card in 1 week.  Anemia:  -B12 deficiency -TSH Normal   Cardiogenic shock:  -BP soft but hemodynamically stable  - improved with diuresis.  Uncontrolled diabetes mellitus:  - HgbA1c 12.3.  - per pt report and office notes CBG  avg 80-200.  - Was on Lantus and Humalog pre admit-cont Lantus and SSI.  ? Diverticulitis large intestine:  - initially dx'd 6/25 and was started on Cipro/Flagyl completed course. - CT abdomen/pelvis 1.5 cm rounded density seen within the sigmoid colon which may represent residual stool, but mass or or polyp cannot be excluded. H/o chronic constipation started on miralax with multiple hard stools.  - Will have to go home on stool softener.    Procedures: Echo 7.7.2015: ejection fraction was in the range of 25% to 30%. There is akinesis of the entireanteroseptal myocardium. - Doppler parameters are consistent with a reversible restrictive pattern, indicative of decreased left ventricular diastolic compliance and/or increased left atrial pressure (grade 3 diastolic dysfunction).  - Cath 7.9.2015:Single vessel obstructive CAD with subtotal occlusion of the RCA at the crux. Severe LV dysfunction. Normal Right heart pressures. Elevated PCWP.   Consultations:  cardiology  Discharge Exam: Filed Vitals:   03/31/14 0439  BP: 114/68  Pulse: 93  Temp: 98.2 F (36.8 C)  Resp: 19    General: A&O x3 Cardiovascular: RRR Respiratory: good air movement CTA B/L  Discharge Instructions  Discharge Instructions   Diet - low sodium heart healthy    Complete by:  As directed      Increase activity slowly    Complete by:  As directed             Medication List    STOP taking these medications       losartan 50 MG tablet  Commonly known as:  COZAAR      TAKE these medications       acetaminophen 500 MG tablet  Commonly known as:  TYLENOL  Take 1,000 mg by mouth every 6 (six) hours as needed. Pain.     aspirin 81 MG chewable tablet  Chew 1 tablet (81 mg total) by mouth daily.     atorvastatin 80 MG tablet  Commonly known as:  LIPITOR  Take 1 tablet (80 mg total) by mouth daily at 6 PM.     carvedilol 6.25 MG tablet  Commonly known as:  COREG  Take 1 tablet (6.25 mg total)  by mouth 2 (two) times daily with a meal.     furosemide 40 MG tablet  Commonly known as:  LASIX  Take 1 tablet (40 mg total) by mouth 2 (two) times daily.     insulin glargine 100 UNIT/ML injection  Commonly known as:  LANTUS  Inject 35 Units into the skin 2 (two) times daily.     insulin lispro 100 UNIT/ML injection  Commonly known as:  HUMALOG  Inject 0.1 mLs (10 Units total) into the skin 3 (three) times daily before meals.     lisinopril 2.5 MG tablet  Commonly known as:  PRINIVIL,ZESTRIL  Take 1 tablet (2.5 mg total) by mouth daily.     metoCLOPramide 5 MG tablet  Commonly known as:  REGLAN  Take 1 tablet (5 mg total) by mouth 4 (four) times daily.       Allergies  Allergen Reactions  . Other Other (See Comments)    FLAGYL, CIPRO, PROTONIX taken at the same time caused patient to lose her breath and have trouble breathing, requiring hospital stay at [facility], 03/23/14-per patient.        Follow-up Information   Follow up with [place]. (Registered Nurse and Physical Therapy services to start within 24-48 hours of discharge)    Contact information:   [address] [place] [address] [phone]       Follow up with HAWKINS,EDWARD L, MD In 1 week. (hospital follow up)    Specialty:  Pulmonary Disease   Contact information:   [place] [address] [phone]        The results of significant diagnostics from this hospitalization (including imaging, microbiology, ancillary and laboratory) are listed below for reference.    Significant Diagnostic Studies: Ct Abdomen Pelvis Wo Contrast  03/26/2014   CLINICAL DATA:  Left lower quadrant abdominal pain.  EXAM: CT ABDOMEN AND PELVIS WITHOUT CONTRAST  TECHNIQUE: Multidetector CT imaging of the abdomen and pelvis was performed following the standard protocol without IV contrast.  COMPARISON:  CT scan of July 08, 2012.  FINDINGS: Mild bilateral pleural effusions  are noted. Right posterior basilar opacity is noted with air bronchograms concerning for pneumonia. Probable avascular necrosis is noted in the left femoral head.  No gallstones are noted. Status post splenectomy. Stable splenule seen in left upper quadrant. No focal abnormality seen in the liver or pancreas on these unenhanced images. Adrenal glands and kidneys appear normal. No hydronephrosis or renal obstruction is noted. There is no evidence of bowel obstruction. Urinary bladder appears normal. Calcified degenerating fibroid is noted in the uterus. No abnormal fluid collection is noted. The appendix appears normal. 1.5 cm rounded density is seen within the lumen of the sigmoid colon on image 60 which may represent stool, but mass or polyp cannot be excluded.  IMPRESSION:  Mild bilateral pleural effusions are noted with right posterior basilar opacity noted concerning for pneumonia.  No hydronephrosis or renal obstruction is noted.  No evidence of bowel obstruction or inflammation.  1.5 cm rounded density seen within the sigmoid colon which may represent residual stool, but mass or or polyp cannot be excluded. Further evaluation with sigmoidoscopy or barium enema is recommended.   Electronically Signed   By: Sabino Dick M.D.   On: 03/26/2014 13:35   Dg Chest Port 1 View  03/26/2014   CLINICAL DATA:  Short of breath  EXAM: PORTABLE CHEST - 1 VIEW  COMPARISON:  03/24/2014  FINDINGS: Endotracheal and NG tubes removed. Right internal jugular central venous catheter removed. Pulmonary edema, cardiomegaly, bilateral pleural effusions stable.  IMPRESSION: Extubated.  Stable CHF.   Electronically Signed   By: Maryclare Bean M.D.   On: 03/26/2014 08:33   Portable Chest Xray In Am  03/24/2014   CLINICAL DATA:  Check endotracheal tube placement  EXAM: PORTABLE CHEST - 1 VIEW  COMPARISON:  03/23/2014  FINDINGS: Endotracheal tube is again identified 4.2 cm above the carina. A nasogastric catheter is seen within the stomach.  Right jugular line is again noted in the superior right atrium. This is somewhat accentuated by poor inspiratory effort. No focal confluent infiltrate is seen although crowding of vascular markings is noted accentuating the degree of vascular congestion.  IMPRESSION: Tubes and lines as described.  Vascular congestion accentuated by the poor inspiratory effort.   Electronically Signed   By: Inez Catalina M.D.   On: 03/24/2014 09:59   Dg Chest Port 1 View  03/23/2014   CLINICAL DATA:  CVL and endotracheal tube replacement.  EXAM: PORTABLE CHEST - 1 VIEW  COMPARISON:  One-view chest from the same day at 2:58 a.m.  FINDINGS: The heart is upper limits of normal for size. The endotracheal tube has been slightly retracted and now terminates 2.7 cm above the carina. And could be pulled back once 1-2 cm for more optimal positioning. A right IJ line is now in place. The tip is above the cavoatrial junction. There is no pneumothorax. An NG tube courses off the inferior border of the film. Moderate pulmonary vascular congestion is present. Overall aeration has improved. Mild dependent atelectasis is present.  IMPRESSION: 1. Repositioning of the endotracheal tube, now 2.7 cm above the carina. 2. A neural placement of right IJ line. The tip is in satisfactory position just above the cavoatrial junction. 3. Interval placement NG tube. 4. Improving aeration with persistent moderate pulmonary vascular congestion.   Electronically Signed   By: Lawrence Santiago M.D.   On: 03/23/2014 08:29   Dg Chest Portable 1 View  03/23/2014   CLINICAL DATA:  Respiratory distress  EXAM: PORTABLE CHEST - 1 VIEW  COMPARISON:  08/14/2012  FINDINGS: An endotracheal tube is in place, tip 2 cm above the carina.  There is mild cardiomegaly. Diffuse interstitial coarsening and possible small pleural effusions. Symmetric perihilar density is likely alveolar edema. No pneumothorax.  IMPRESSION: 1. Endotracheal tube ends 2 cm above the carina. 2. CHF.    Electronically Signed   By: Jorje Guild M.D.   On: 03/23/2014 03:31    Microbiology: Recent Results (from the past 240 hour(s))  CULTURE, BLOOD (ROUTINE X 2)     Status: None   Collection Time    03/23/14  3:16 AM      Result Value Ref Range Status   Specimen Description BLOOD LEFT HAND DRAWN BY  RN   Final   Special Requests     Final   Value: BOTTLES DRAWN AEROBIC AND ANAEROBIC 6CC EACH BOTTLE   Culture NO GROWTH 5 DAYS   Final   Report Status 03/28/2014 FINAL   Final  CULTURE, BLOOD (ROUTINE X 2)     Status: None   Collection Time    03/23/14  3:21 AM      Result Value Ref Range Status   Specimen Description BLOOD LEFT HAND   Final   Special Requests     Final   Value: BOTTLES DRAWN AEROBIC AND ANAEROBIC 6CC EACH BOTTLE   Culture NO GROWTH 5 DAYS   Final   Report Status 03/28/2014 FINAL   Final  MRSA PCR SCREENING     Status: None   Collection Time    03/23/14  6:39 AM      Result Value Ref Range Status   MRSA by PCR NEGATIVE  NEGATIVE Final   Comment:            The GeneXpert MRSA Assay (FDA     approved for NASAL specimens     only), is one component of a     comprehensive MRSA colonization     surveillance program. It is not     intended to diagnose MRSA     infection nor to guide or     monitor treatment for     MRSA infections.     Labs: Basic Metabolic Panel:  Recent Labs Lab 03/24/14 1700 03/25/14 0120 03/26/14 0320 03/27/14 0326 03/28/14 0300 03/28/14 1240 03/29/14 1045  NA 137 137 137 138 137  --  138  K 3.8 3.4* 4.2 3.8 4.4  --  3.8  CL 100 98 97 98 98  --  97  CO2 _0 --  26  GLUCOSE 112* 111* 170* 124* 172*  --  124*  BUN _1 --  7  CREATININE 0.64 0.69 0.75 0.70 0.73 0.64 0.64  CALCIUM 8.2* 8.4 8.6 9.2 9.4  --  9.2  MG 1.8  --   --   --   --   --   --    Liver Function Tests:  Recent Labs Lab 03/27/14 0326  AST 14  ALT 12  ALKPHOS 53  BILITOT 0.4  PROT 7.1  ALBUMIN 3.0*   No results found for this  basename: LIPASE, AMYLASE,  in the last 168 hours No results found for this basename: AMMONIA,  in the last 168 hours CBC:  Recent Labs Lab 03/24/14 1137 03/26/14 0320 03/27/14 0326 03/28/14 1240  WBC 11.7* 8.6 8.7 6.6  NEUTROABS 9.2*  --  5.3  --   HGB 10.5* 10.4* 10.1* 10.9*  HCT 32.5* 31.9* 31.6* 33.9*  MCV 81.7 82.0 82.1 82.9  PLT 202 206 219 PLATELET CLUMPS NOTED ON SMEAR, COUNT APPEARS ADEQUATE   Cardiac Enzymes:  Recent Labs Lab 03/24/14 1550 03/24/14 1928 03/25/14 0120  TROPONINI <0.30 <0.30 <0.30   BNP: BNP (last 3 results)  Recent Labs  03/23/14 0257 03/26/14 0320  PROBNP 1031.0* 1174.0*   CBG:  Recent Labs Lab 03/30/14 0553 03/30/14 1114 03/30/14 1641 03/30/14 2119 03/31/14 0628  GLUCAP 159* 211* 167* 130* 169*       Signed:  Charlynne Cousins  [facility] 03/31/2014, 10:05 AM   **Disclaimer: This note may have been dictated with voice recognition software. Similar sounding words can inadvertently be transcribed and  this note may contain transcription errors which may not have been corrected upon publication of note.**

## 2014-04-01 DIAGNOSIS — I509 Heart failure, unspecified: Secondary | ICD-10-CM | POA: Diagnosis not present

## 2014-04-01 DIAGNOSIS — I5043 Acute on chronic combined systolic (congestive) and diastolic (congestive) heart failure: Secondary | ICD-10-CM | POA: Diagnosis not present

## 2014-04-01 DIAGNOSIS — Z794 Long term (current) use of insulin: Secondary | ICD-10-CM | POA: Diagnosis not present

## 2014-04-01 DIAGNOSIS — E669 Obesity, unspecified: Secondary | ICD-10-CM | POA: Diagnosis not present

## 2014-04-01 DIAGNOSIS — Z5181 Encounter for therapeutic drug level monitoring: Secondary | ICD-10-CM | POA: Diagnosis not present

## 2014-04-01 DIAGNOSIS — E119 Type 2 diabetes mellitus without complications: Secondary | ICD-10-CM | POA: Diagnosis not present

## 2014-04-01 DIAGNOSIS — I10 Essential (primary) hypertension: Secondary | ICD-10-CM | POA: Diagnosis not present

## 2014-04-01 DIAGNOSIS — I2510 Atherosclerotic heart disease of native coronary artery without angina pectoris: Secondary | ICD-10-CM | POA: Diagnosis not present

## 2014-04-02 DIAGNOSIS — I5043 Acute on chronic combined systolic (congestive) and diastolic (congestive) heart failure: Secondary | ICD-10-CM | POA: Diagnosis not present

## 2014-04-02 DIAGNOSIS — E669 Obesity, unspecified: Secondary | ICD-10-CM | POA: Diagnosis not present

## 2014-04-02 DIAGNOSIS — E119 Type 2 diabetes mellitus without complications: Secondary | ICD-10-CM | POA: Diagnosis not present

## 2014-04-02 DIAGNOSIS — I509 Heart failure, unspecified: Secondary | ICD-10-CM | POA: Diagnosis not present

## 2014-04-02 DIAGNOSIS — I10 Essential (primary) hypertension: Secondary | ICD-10-CM | POA: Diagnosis not present

## 2014-04-02 DIAGNOSIS — I2510 Atherosclerotic heart disease of native coronary artery without angina pectoris: Secondary | ICD-10-CM | POA: Diagnosis not present

## 2014-04-04 ENCOUNTER — Ambulatory Visit (HOSPITAL_COMMUNITY)
Admit: 2014-04-04 | Discharge: 2014-04-04 | Disposition: A | Discharge status: Home / Self Care | Payer: Medicare Other | Source: Ambulatory Visit | Attending: Cardiology | Admitting: Cardiology

## 2014-04-04 ENCOUNTER — Encounter (HOSPITAL_COMMUNITY): Payer: Self-pay

## 2014-04-04 VITALS — BP 102/69 | HR 71 | Resp 18 | Wt 209.0 lb

## 2014-04-04 DIAGNOSIS — I10 Essential (primary) hypertension: Secondary | ICD-10-CM | POA: Diagnosis not present

## 2014-04-04 DIAGNOSIS — E669 Obesity, unspecified: Secondary | ICD-10-CM | POA: Diagnosis not present

## 2014-04-04 DIAGNOSIS — I2510 Atherosclerotic heart disease of native coronary artery without angina pectoris: Secondary | ICD-10-CM | POA: Insufficient documentation

## 2014-04-04 DIAGNOSIS — Z794 Long term (current) use of insulin: Secondary | ICD-10-CM | POA: Diagnosis not present

## 2014-04-04 DIAGNOSIS — I2589 Other forms of chronic ischemic heart disease: Secondary | ICD-10-CM

## 2014-04-04 DIAGNOSIS — IMO0001 Reserved for inherently not codable concepts without codable children: Secondary | ICD-10-CM | POA: Diagnosis not present

## 2014-04-04 DIAGNOSIS — D6949 Other primary thrombocytopenia: Secondary | ICD-10-CM | POA: Diagnosis not present

## 2014-04-04 DIAGNOSIS — I5022 Chronic systolic (congestive) heart failure: Secondary | ICD-10-CM | POA: Diagnosis not present

## 2014-04-04 DIAGNOSIS — I255 Ischemic cardiomyopathy: Secondary | ICD-10-CM | POA: Insufficient documentation

## 2014-04-04 DIAGNOSIS — I152 Hypertension secondary to endocrine disorders: Secondary | ICD-10-CM | POA: Insufficient documentation

## 2014-04-04 DIAGNOSIS — Z7982 Long term (current) use of aspirin: Secondary | ICD-10-CM | POA: Insufficient documentation

## 2014-04-04 DIAGNOSIS — I509 Heart failure, unspecified: Secondary | ICD-10-CM | POA: Diagnosis not present

## 2014-04-04 DIAGNOSIS — I5042 Chronic combined systolic (congestive) and diastolic (congestive) heart failure: Secondary | ICD-10-CM

## 2014-04-04 DIAGNOSIS — M129 Arthropathy, unspecified: Secondary | ICD-10-CM | POA: Diagnosis not present

## 2014-04-04 DIAGNOSIS — E1165 Type 2 diabetes mellitus with hyperglycemia: Secondary | ICD-10-CM

## 2014-04-04 DIAGNOSIS — IMO0002 Reserved for concepts with insufficient information to code with codable children: Secondary | ICD-10-CM

## 2014-04-04 DIAGNOSIS — Z87891 Personal history of nicotine dependence: Secondary | ICD-10-CM | POA: Insufficient documentation

## 2014-04-04 HISTORY — DX: Chronic systolic (congestive) heart failure: I50.22

## 2014-04-04 HISTORY — DX: Ischemic cardiomyopathy: I25.5

## 2014-04-04 HISTORY — DX: Atherosclerotic heart disease of native coronary artery without angina pectoris: I25.10

## 2014-04-04 LAB — BASIC METABOLIC PANEL
ANION GAP: 15 (ref 5–15)
BUN: 12 mg/dL (ref 6–23)
CALCIUM: 9.2 mg/dL (ref 8.4–10.5)
CHLORIDE: 99 meq/L (ref 96–112)
CO2: 25 mEq/L (ref 19–32)
Creatinine, Ser: 0.69 mg/dL (ref 0.50–1.10)
GFR calc Af Amer: >90 mL/min (ref >90)
GFR, EST NON AFRICAN AMERICAN: 89 mL/min — AB (ref >90)
Glucose, Bld: 51 mg/dL — ABNORMAL LOW (ref 70–99)
Potassium: 3.8 mEq/L (ref 3.7–5.3)
Sodium: 139 mEq/L (ref 137–147)

## 2014-04-04 MED ORDER — SPIRONOLACTONE 25 MG PO TABS: 12.5000 mg | ORAL_TABLET | Freq: Every day | ORAL | Status: DC

## 2014-04-04 NOTE — Progress Notes (Signed)
Patient ID: [name], female   DOB: 1951-02-22, 63 y.o.   MRN: [id number] PCP: Dr. Luan Pulling Linna Hoff) Primary Cardiologist: Dr. [name]  HPI: [name] is a 63 yo female with a history of DM2, obesity, TTP, HTN, ICM, CAD and chronic systolic HF.   Admitted 7/4-7/12/15 for ARF and was intubated. ECHO showed depressed EF and was diuresed and extubated. Underwent R/LHC showing single vessel obstructive CAD with subtotal occlusion RCA @ crux and mildly elevated pressures. Discharge weight 212 lbs.   [facility] Follow up for Heart Failure: Doing ok. Complains of fatigue. Was doing well and no HF symptoms until admission. Denies SOB, orthopnea, PND or CP. Denies dizziness. She is weighing daily at home and having some discrepancies, 218 lbs. Has HHRN/PT. + heart palpitations occasionally. Following a low salt diet and drinking less than 2L a day.   ROS: All systems negative except as listed in HPI, PMH and Problem List.  SH:  History   Social History  . Marital Status: Divorced    Spouse Name: N/A    Number of Children: 3  . Years of Education: N/A   Occupational History  . disabled    Social History Main Topics  . Smoking status: Former Smoker -- 0.25 packs/day for 10 years    Types: Cigarettes  . Smokeless tobacco: Not on file  . Alcohol Use: No  . Drug Use: No  . Sexual Activity: Not on file   Other Topics Concern  . Not on file   Social History Narrative   Lives w/ daughter, [name]          FH:  Family History  Problem Relation Age of Onset  . Heart failure Mother   . Cirrhosis Father     ETOH  . Colon cancer Neg Hx   . Diabetes Daughter     Past Medical History  Diagnosis Date  . Diabetes mellitus   . Hypertension   . Arthritis   . TTP (thrombotic thrombocytopenic purpura)   . Erosive esophagitis   . Gastritis   . Anemia   . Thyroid disease   . Duodenal ulcer     Age 43  . Obese   . Short-term memory loss   . Chronic systolic heart  failure     a. ECHO (03/2014): EF 25-30%, akinesis enteroanteroseptal myocardium, grade III DD Best. RHC (03/2014) RA 4, RV 42/4, PA 44/14 (26), PCWP 21, PA 62% Fick CO/CI 6.9 / 3.4  . Ischemic cardiomyopathy   . CAD (coronary artery disease)     a. LHC (03/2014) [place]: nl, LAD: diff dz proximal 20%, 30-40% dz mid vessel prior 2nd diagonal, LCx: 40% in OM1, 20-30% in OM2, RCA: 99% subtotal occlusion @ crux, TIMI 1 flow, L-R collaterals to distal vessel    Current Outpatient Prescriptions  Medication Sig Dispense Refill  . acetaminophen (TYLENOL) 500 MG tablet Take 1,000 mg by mouth every 6 (six) hours as needed. Pain.      Marland Kitchen aspirin 81 MG chewable tablet Chew 1 tablet (81 mg total) by mouth daily.      Marland Kitchen atorvastatin (LIPITOR) 80 MG tablet Take 1 tablet (80 mg total) by mouth daily at 6 PM.  30 tablet  0  . carvedilol (COREG) 6.25 MG tablet Take 1 tablet (6.25 mg total) by mouth 2 (two) times daily with a meal.  60 tablet  0  . furosemide (LASIX) 40 MG tablet Take 1 tablet (40 mg total) by mouth 2 (two)  times daily.  30 tablet  0  . insulin glargine (LANTUS) 100 UNIT/ML injection Inject 35 Units into the skin 2 (two) times daily.      . insulin lispro (HUMALOG) 100 UNIT/ML injection Inject 0.1 mLs (10 Units total) into the skin 3 (three) times daily before meals.  10 mL  11  . lisinopril (PRINIVIL,ZESTRIL) 2.5 MG tablet Take 1 tablet (2.5 mg total) by mouth daily.  30 tablet  0  . metoCLOPramide (REGLAN) 5 MG tablet Take 1 tablet (5 mg total) by mouth 4 (four) times daily.  120 tablet  5   No current facility-administered medications for this encounter.    Filed Vitals:   04/04/14 1428  BP: 102/69  Pulse: 71  Resp: 18  Weight: 209 lb (94.802 kg)  SpO2: 98%    PHYSICAL EXAM:  General:  Well appearing. No resp difficulty, daughter present HEENT: normal Neck: supple. JVP flat. Carotids 2+ bilaterally; no bruits. No lymphadenopathy or thryomegaly appreciated. Cor: PMI normal. Regular rate &  rhythm. No rubs, gallops or murmurs. Lungs: diminished in the bases. Abdomen: obese, soft, nontender, nondistended. No hepatosplenomegaly. No bruits or masses. Good bowel sounds. Extremities: no cyanosis, clubbing, rash, edema, cool extremities, 2+ pedal pulses Neuro: alert & orientedx3, cranial nerves grossly intact. Moves all 4 extremities w/o difficulty. Affect pleasant.  ASSESSMENT & PLAN:  1) Chronic combined systolic/diastolic HF: ICM, EF 16-01% with grade III DD (03/2014) -Reviewed discharge summary and patient recently admitted with ARF and was intubated. Diuresed and ECHO showed new systolic HF. R/LHC showed mildly elevated filling pressures and single vessel CAD. - NYHA II symptoms and volume status stable. Will continue lasix 40 mg BID. We discussed the use of sliding scale diuretics. - Will continue coreg 6.25 mg BID and lisinopril 2.5 mg daily. - Start spiro 12.5 mg daily. Check BMET today and in 7-10 days. - Will need repeat ECHO (06/2014) to assess EF. If remains less than 35% will need referral to EP for consideration of ICD preferably [employer] d/t HF diagnostics.  - Discussed the role of the transitions clinic and that she needs to continue to follow up with primary cardiologist. Provided her with medication bag to bring medications in and pill box. Reinforced the need and importance of daily weights, a low sodium diet, and fluid restriction (less than 2 L a day). Instructed to call the HF clinic if weight increases more than 3 lbs overnight or 5 lbs in a week.  2) HTN - stable. As above will start spironolactone for HF. 3) CAD - LHC during hospitalization showing single vessel CAD with subtotal occlusion of RCA. Will continue statin, ACE-I, BB and ASA. Continue to follow with primary cardiologist.  4) Obesity - Discussed trying to lose weight by being more active and watching portion sizes. 5) ?OSA - Patient and daughter report she snores. Will eventually need sleep study.  Will set up after she gets adjusted to being home and gets some of her medical appointments out of the way.   F/U 2 weeks Christina Bame B NP-C 4:37 PM

## 2014-04-04 NOTE — Patient Instructions (Signed)
Doing great.  Will start spironolactone 12.5 mg (1/2 tablet) daily.  Follow up in 2 weeks.  Call if any dizziness.  Do the following things EVERYDAY: 1) Weigh yourself in the morning before breakfast. Write it down and keep it in a log. 2) Take your medicines as prescribed 3) Eat low salt foods-Limit salt (sodium) to 2000 mg per day.  4) Stay as active as you can everyday 5) Limit all fluids for the day to less than 2 liters 6)

## 2014-04-05 DIAGNOSIS — I509 Heart failure, unspecified: Secondary | ICD-10-CM | POA: Diagnosis not present

## 2014-04-05 DIAGNOSIS — I10 Essential (primary) hypertension: Secondary | ICD-10-CM | POA: Diagnosis not present

## 2014-04-05 DIAGNOSIS — E119 Type 2 diabetes mellitus without complications: Secondary | ICD-10-CM | POA: Diagnosis not present

## 2014-04-05 DIAGNOSIS — E669 Obesity, unspecified: Secondary | ICD-10-CM | POA: Diagnosis not present

## 2014-04-05 DIAGNOSIS — I5043 Acute on chronic combined systolic (congestive) and diastolic (congestive) heart failure: Secondary | ICD-10-CM | POA: Diagnosis not present

## 2014-04-05 DIAGNOSIS — I2510 Atherosclerotic heart disease of native coronary artery without angina pectoris: Secondary | ICD-10-CM | POA: Diagnosis not present

## 2014-04-08 ENCOUNTER — Encounter: Payer: Self-pay | Admitting: Physician Assistant

## 2014-04-08 ENCOUNTER — Ambulatory Visit (INDEPENDENT_AMBULATORY_CARE_PROVIDER_SITE_OTHER): Payer: Medicare Other | Admitting: Physician Assistant

## 2014-04-08 VITALS — BP 108/68 | HR 90 | Ht 65.0 in | Wt 208.0 lb

## 2014-04-08 DIAGNOSIS — I10 Essential (primary) hypertension: Secondary | ICD-10-CM

## 2014-04-08 DIAGNOSIS — I509 Heart failure, unspecified: Secondary | ICD-10-CM | POA: Diagnosis not present

## 2014-04-08 DIAGNOSIS — I5042 Chronic combined systolic (congestive) and diastolic (congestive) heart failure: Secondary | ICD-10-CM

## 2014-04-08 DIAGNOSIS — I2589 Other forms of chronic ischemic heart disease: Secondary | ICD-10-CM | POA: Diagnosis not present

## 2014-04-08 DIAGNOSIS — I255 Ischemic cardiomyopathy: Secondary | ICD-10-CM

## 2014-04-08 NOTE — Patient Instructions (Signed)
Your physician recommends that you schedule a follow-up appointment in:  2 months with Dr Branch  Your physician recommends that you continue on your current medications as directed. Please refer to the Current Medication list given to you today.   

## 2014-04-08 NOTE — Progress Notes (Signed)
HPI:  This is a 63 year old female patient who is admitted to the hospital with acute respiratory failure requiring intubation. 2-D echo showed EF to be 25-30%. She underwent cardiac catheterization which showed mildly elevated filling pressures and single vessel CAD with subtotal occlusion of the RCA at the crux. Medical management was recommended. Her agree of LV dysfunction was out of proportion to her CAD and she had no anginal symptoms. The inferior wall appeared scarred.  The patient was seen in the heart failure clinic on Thursday and Aldactone was added. The patient denies any dyspnea, dyspnea on exertion, chest pain, dizziness or presyncope. She does complain of a dry cough when she lays down at night. She says she can't stop coughing and has to sit up.    Allergies-- Other -- Other (See Comments)   --  FLAGYL, CIPRO, PROTONIX taken at the same time            caused patient to lose her breath and have trouble            breathing, requiring hospital stay at [facility],            03/23/14-per patient.  Current Outpatient Prescriptions on File Prior to Visit: acetaminophen (TYLENOL) 500 MG tablet, Take 1,000 mg by mouth every 6 (six) hours as needed. Pain., Disp: , Rfl:  aspirin 81 MG chewable tablet, Chew 1 tablet (81 mg total) by mouth daily., Disp: , Rfl:  atorvastatin (LIPITOR) 80 MG tablet, Take 1 tablet (80 mg total) by mouth daily at 6 PM., Disp: 30 tablet, Rfl: 0 carvedilol (COREG) 6.25 MG tablet, Take 1 tablet (6.25 mg total) by mouth 2 (two) times daily with a meal., Disp: 60 tablet, Rfl: 0 furosemide (LASIX) 40 MG tablet, Take 1 tablet (40 mg total) by mouth 2 (two) times daily., Disp: 30 tablet, Rfl: 0 insulin glargine (LANTUS) 100 UNIT/ML injection, Inject 35 Units into the skin 2 (two) times daily., Disp: , Rfl:  lisinopril (PRINIVIL,ZESTRIL) 2.5 MG tablet, Take 1 tablet (2.5 mg total) by mouth daily., Disp: 30 tablet, Rfl: 0 metoCLOPramide (REGLAN) 5 MG tablet, Take 1 tablet (5 mg  total) by mouth 4 (four) times daily., Disp: 120 tablet, Rfl: 5 spironolactone (ALDACTONE) 25 MG tablet, Take 0.5 tablets (12.5 mg total) by mouth daily., Disp: 15 tablet, Rfl: 3  No current facility-administered medications on file prior to visit.   Past Medical History:   Diabetes mellitus                                            Hypertension                                                 Arthritis                                                    TTP (thrombotic thrombocytopenic purpura)                    Erosive esophagitis  Gastritis                                                    Anemia                                                       Thyroid disease                                              Duodenal ulcer                                                 Comment:Age 37   Obese                                                        Short-term memory loss                                       Chronic systolic heart failure                                 Comment:a. ECHO (03/2014): EF 25-30%, akinesis               enteroanteroseptal myocardium, grade III DD b.               RHC (03/2014) RA 4, RV 42/4, PA 44/14 (26), PCWP              21, PA 62% Fick CO/CI 6.9 / 3.4   Ischemic cardiomyopathy                                      CAD (coronary artery disease)                                  Comment:a. LHC (03/2014) Lmain: nl, LAD: diff dz               proximal 20%, 30-40% dz mid vessel prior 2nd               diagonal, LCx: 40% in OM1, 20-30% in OM2, RCA:               99% subtotal occlusion @ crux, TIMI 1 flow, L-R              collaterals to distal vessel  Past Surgical History:   SPLENECTOMY, PARTIAL  ECTOPIC PREGNANCY SURGERY                                     ESOPHAGOGASTRODUODENOSCOPY                       04/20/2012       Comment:NUR:Erosive antral gastritis/Erosive reflux                esophagitis   COLONOSCOPY                                      08/29/2012     Comment:RMR:Colonic diverticulosis  Review of patient's family history indicates:   Heart failure                  Mother                   Cirrhosis                      Father                     Comment: ETOH   Colon cancer                   Neg Hx                   Diabetes                       Daughter                 Social History   Marital Status: Divorced            Spouse Name:                      Years of Education:                 Number of children: 3           Occupational History Occupation          Fish farm manager            Comment              disabled                                  Social History Main Topics   Smoking Status: Former Smoker                   Packs/Day: 0.25  Years: 10        Types: Cigarettes   Smokeless Status: Not on file                      Comment: Quit 2008   Alcohol Use: No             Drug Use: No             Sexual Activity: Not on file        Other Topics            Concern   None on file  Social History Narrative   Lives w/  daughter, [name]      ROS: Tired, otherwise See history of present illness otherwise negative   PHYSICAL EXAM: Obese, in no acute distress. Neck: No JVD, HJR, Bruit, or thyroid enlargement  Lungs: No tachypnea, clear without wheezing, rales, or rhonchi  Cardiovascular: RRR, PMI not displaced, heart sounds normal, no murmurs, gallops, bruit, thrill, or heave.  Abdomen: BS normal. Soft without organomegaly, masses, lesions or tenderness.  Extremities: Right arm at cath site without hematoma or hemorrhage good radial and brachial pulses, otherwise lower extremities without cyanosis, clubbing or edema. Good distal pulses bilateral  SKin: Warm, no lesions or rashes   Musculoskeletal: No deformities  Neuro: no focal signs  BP 108/68  Pulse 90  Ht 5\' 5"  (1.651 m)  Wt 208 lb (94.348 kg)  BMI 34.61 kg/m2  SpO2  93%   EKG: Normal sinus rhythm with nonspecific intraventricular block, lateral infarct

## 2014-04-08 NOTE — Assessment & Plan Note (Signed)
Heart failure is well compensated today. She will be followed in the heart failure clinic in [place] in 2 weeks.

## 2014-04-08 NOTE — Assessment & Plan Note (Signed)
Patient's EF is 25-30%. Plan is to repeat 2-D echo in 3 months. If her LV function remains less than 35% she will need referral to EP for consideration of ICD. Cardiac cath showed subtotal RCA. Patient is asymptomatic. Continue medical therapy.

## 2014-04-08 NOTE — Assessment & Plan Note (Signed)
Control 

## 2014-04-09 DIAGNOSIS — I509 Heart failure, unspecified: Secondary | ICD-10-CM | POA: Diagnosis not present

## 2014-04-09 DIAGNOSIS — I5043 Acute on chronic combined systolic (congestive) and diastolic (congestive) heart failure: Secondary | ICD-10-CM | POA: Diagnosis not present

## 2014-04-09 DIAGNOSIS — I10 Essential (primary) hypertension: Secondary | ICD-10-CM | POA: Diagnosis not present

## 2014-04-09 DIAGNOSIS — I2510 Atherosclerotic heart disease of native coronary artery without angina pectoris: Secondary | ICD-10-CM | POA: Diagnosis not present

## 2014-04-09 DIAGNOSIS — E119 Type 2 diabetes mellitus without complications: Secondary | ICD-10-CM | POA: Diagnosis not present

## 2014-04-09 DIAGNOSIS — E669 Obesity, unspecified: Secondary | ICD-10-CM | POA: Diagnosis not present

## 2014-04-10 DIAGNOSIS — I10 Essential (primary) hypertension: Secondary | ICD-10-CM | POA: Diagnosis not present

## 2014-04-10 DIAGNOSIS — E669 Obesity, unspecified: Secondary | ICD-10-CM | POA: Diagnosis not present

## 2014-04-10 DIAGNOSIS — E119 Type 2 diabetes mellitus without complications: Secondary | ICD-10-CM | POA: Diagnosis not present

## 2014-04-10 DIAGNOSIS — I509 Heart failure, unspecified: Secondary | ICD-10-CM | POA: Diagnosis not present

## 2014-04-10 DIAGNOSIS — I5043 Acute on chronic combined systolic (congestive) and diastolic (congestive) heart failure: Secondary | ICD-10-CM | POA: Diagnosis not present

## 2014-04-10 DIAGNOSIS — I2510 Atherosclerotic heart disease of native coronary artery without angina pectoris: Secondary | ICD-10-CM | POA: Diagnosis not present

## 2014-04-10 NOTE — Progress Notes (Signed)
Please offer patient OV follow up in 3 months (?need for colonoscopy or sigmoidoscopy for abnormal CT during recent hospitalization)  I reviewed last CT and no mention of cirrhosis. No further imaging needed at this time.

## 2014-04-11 ENCOUNTER — Encounter: Payer: Self-pay | Admitting: Internal Medicine

## 2014-04-11 DIAGNOSIS — I5043 Acute on chronic combined systolic (congestive) and diastolic (congestive) heart failure: Secondary | ICD-10-CM | POA: Diagnosis not present

## 2014-04-11 DIAGNOSIS — E669 Obesity, unspecified: Secondary | ICD-10-CM | POA: Diagnosis not present

## 2014-04-11 DIAGNOSIS — E119 Type 2 diabetes mellitus without complications: Secondary | ICD-10-CM | POA: Diagnosis not present

## 2014-04-11 DIAGNOSIS — I10 Essential (primary) hypertension: Secondary | ICD-10-CM | POA: Diagnosis not present

## 2014-04-11 DIAGNOSIS — I509 Heart failure, unspecified: Secondary | ICD-10-CM | POA: Diagnosis not present

## 2014-04-11 DIAGNOSIS — I2510 Atherosclerotic heart disease of native coronary artery without angina pectoris: Secondary | ICD-10-CM | POA: Diagnosis not present

## 2014-04-12 DIAGNOSIS — I2510 Atherosclerotic heart disease of native coronary artery without angina pectoris: Secondary | ICD-10-CM | POA: Diagnosis not present

## 2014-04-12 DIAGNOSIS — I5043 Acute on chronic combined systolic (congestive) and diastolic (congestive) heart failure: Secondary | ICD-10-CM | POA: Diagnosis not present

## 2014-04-12 DIAGNOSIS — I10 Essential (primary) hypertension: Secondary | ICD-10-CM | POA: Diagnosis not present

## 2014-04-12 DIAGNOSIS — E119 Type 2 diabetes mellitus without complications: Secondary | ICD-10-CM | POA: Diagnosis not present

## 2014-04-12 DIAGNOSIS — E669 Obesity, unspecified: Secondary | ICD-10-CM | POA: Diagnosis not present

## 2014-04-12 DIAGNOSIS — I509 Heart failure, unspecified: Secondary | ICD-10-CM | POA: Diagnosis not present

## 2014-04-13 ENCOUNTER — Inpatient Hospital Stay (HOSPITAL_COMMUNITY)
Admission: EM | Admit: 2014-04-13 | Discharge: 2014-04-15 | DRG: 392 | Disposition: A | Discharge status: Home / Self Care | Payer: Medicare Other | Attending: Internal Medicine | Admitting: Internal Medicine

## 2014-04-13 ENCOUNTER — Inpatient Hospital Stay (HOSPITAL_COMMUNITY): Payer: Medicare Other

## 2014-04-13 ENCOUNTER — Emergency Department (HOSPITAL_COMMUNITY): Payer: Medicare Other

## 2014-04-13 ENCOUNTER — Encounter (HOSPITAL_COMMUNITY): Payer: Self-pay | Admitting: Emergency Medicine

## 2014-04-13 DIAGNOSIS — Z87891 Personal history of nicotine dependence: Secondary | ICD-10-CM | POA: Diagnosis not present

## 2014-04-13 DIAGNOSIS — Z79899 Other long term (current) drug therapy: Secondary | ICD-10-CM | POA: Diagnosis not present

## 2014-04-13 DIAGNOSIS — Z9119 Patient's noncompliance with other medical treatment and regimen: Secondary | ICD-10-CM

## 2014-04-13 DIAGNOSIS — I5042 Chronic combined systolic (congestive) and diastolic (congestive) heart failure: Secondary | ICD-10-CM | POA: Diagnosis present

## 2014-04-13 DIAGNOSIS — Z794 Long term (current) use of insulin: Secondary | ICD-10-CM | POA: Diagnosis not present

## 2014-04-13 DIAGNOSIS — I152 Hypertension secondary to endocrine disorders: Secondary | ICD-10-CM | POA: Diagnosis present

## 2014-04-13 DIAGNOSIS — R112 Nausea with vomiting, unspecified: Secondary | ICD-10-CM | POA: Diagnosis present

## 2014-04-13 DIAGNOSIS — I10 Essential (primary) hypertension: Secondary | ICD-10-CM | POA: Diagnosis present

## 2014-04-13 DIAGNOSIS — I255 Ischemic cardiomyopathy: Secondary | ICD-10-CM

## 2014-04-13 DIAGNOSIS — I420 Dilated cardiomyopathy: Secondary | ICD-10-CM

## 2014-04-13 DIAGNOSIS — R1115 Cyclical vomiting syndrome unrelated to migraine: Secondary | ICD-10-CM | POA: Diagnosis not present

## 2014-04-13 DIAGNOSIS — K3184 Gastroparesis: Secondary | ICD-10-CM | POA: Diagnosis present

## 2014-04-13 DIAGNOSIS — E1165 Type 2 diabetes mellitus with hyperglycemia: Secondary | ICD-10-CM | POA: Diagnosis present

## 2014-04-13 DIAGNOSIS — E1140 Type 2 diabetes mellitus with diabetic neuropathy, unspecified: Secondary | ICD-10-CM

## 2014-04-13 DIAGNOSIS — I509 Heart failure, unspecified: Secondary | ICD-10-CM | POA: Diagnosis present

## 2014-04-13 DIAGNOSIS — K219 Gastro-esophageal reflux disease without esophagitis: Secondary | ICD-10-CM | POA: Diagnosis present

## 2014-04-13 DIAGNOSIS — R109 Unspecified abdominal pain: Secondary | ICD-10-CM | POA: Diagnosis not present

## 2014-04-13 DIAGNOSIS — K5909 Other constipation: Secondary | ICD-10-CM

## 2014-04-13 DIAGNOSIS — I428 Other cardiomyopathies: Secondary | ICD-10-CM | POA: Diagnosis not present

## 2014-04-13 DIAGNOSIS — R1110 Vomiting, unspecified: Secondary | ICD-10-CM

## 2014-04-13 DIAGNOSIS — IMO0002 Reserved for concepts with insufficient information to code with codable children: Secondary | ICD-10-CM

## 2014-04-13 DIAGNOSIS — I2510 Atherosclerotic heart disease of native coronary artery without angina pectoris: Secondary | ICD-10-CM | POA: Diagnosis present

## 2014-04-13 DIAGNOSIS — K5900 Constipation, unspecified: Secondary | ICD-10-CM | POA: Diagnosis not present

## 2014-04-13 DIAGNOSIS — IMO0001 Reserved for inherently not codable concepts without codable children: Secondary | ICD-10-CM | POA: Diagnosis present

## 2014-04-13 DIAGNOSIS — I2589 Other forms of chronic ischemic heart disease: Secondary | ICD-10-CM | POA: Diagnosis not present

## 2014-04-13 DIAGNOSIS — Z91199 Patient's noncompliance with other medical treatment and regimen due to unspecified reason: Secondary | ICD-10-CM | POA: Diagnosis not present

## 2014-04-13 DIAGNOSIS — K760 Fatty (change of) liver, not elsewhere classified: Secondary | ICD-10-CM

## 2014-04-13 DIAGNOSIS — R7989 Other specified abnormal findings of blood chemistry: Secondary | ICD-10-CM | POA: Diagnosis not present

## 2014-04-13 LAB — COMPREHENSIVE METABOLIC PANEL
ALBUMIN: 4.1 g/dL (ref 3.5–5.2)
ALBUMIN: 3.9 g/dL (ref 3.5–5.2)
ALK PHOS: 80 U/L (ref 39–117)
ALT: 156 U/L — ABNORMAL HIGH (ref 0–35)
ALT: 142 U/L — ABNORMAL HIGH (ref 0–35)
AST: 101 U/L — AB (ref 0–37)
AST: 69 U/L — AB (ref 0–37)
Alkaline Phosphatase: 84 U/L (ref 39–117)
Anion gap: 17 — ABNORMAL HIGH (ref 5–15)
Anion gap: 16 — ABNORMAL HIGH (ref 5–15)
BILIRUBIN TOTAL: 0.6 mg/dL (ref 0.3–1.2)
BILIRUBIN TOTAL: 0.6 mg/dL (ref 0.3–1.2)
BUN: 17 mg/dL (ref 6–23)
BUN: 17 mg/dL (ref 6–23)
CHLORIDE: 95 meq/L — AB (ref 96–112)
CHLORIDE: 99 meq/L (ref 96–112)
CO2: 23 mEq/L (ref 19–32)
CO2: 24 meq/L (ref 19–32)
Calcium: 9.6 mg/dL (ref 8.4–10.5)
Calcium: 9.3 mg/dL (ref 8.4–10.5)
Creatinine, Ser: 0.59 mg/dL (ref 0.50–1.10)
Creatinine, Ser: 0.68 mg/dL (ref 0.50–1.10)
GFR calc Af Amer: >90 mL/min (ref >90)
GFR calc Af Amer: >90 mL/min (ref >90)
GFR calc non Af Amer: >90 mL/min (ref >90)
GFR calc non Af Amer: 90 mL/min — ABNORMAL LOW (ref >90)
Glucose, Bld: 186 mg/dL — ABNORMAL HIGH (ref 70–99)
Glucose, Bld: 177 mg/dL — ABNORMAL HIGH (ref 70–99)
POTASSIUM: 5.9 meq/L — AB (ref 3.7–5.3)
Potassium: 4.2 mEq/L (ref 3.7–5.3)
Sodium: 135 mEq/L — ABNORMAL LOW (ref 137–147)
Sodium: 139 mEq/L (ref 137–147)
TOTAL PROTEIN: 9.1 g/dL — AB (ref 6.0–8.3)
Total Protein: 8.4 g/dL — ABNORMAL HIGH (ref 6.0–8.3)

## 2014-04-13 LAB — CBC WITH DIFFERENTIAL/PLATELET
BASOS ABS: 0 10*3/uL (ref 0.0–0.1)
Basophils Relative: 0 % (ref 0–1)
Eosinophils Absolute: 0 10*3/uL (ref 0.0–0.7)
Eosinophils Relative: 0 % (ref 0–5)
HCT: 40.1 % (ref 36.0–46.0)
HEMOGLOBIN: 13.3 g/dL (ref 12.0–15.0)
Lymphocytes Relative: 16 % (ref 12–46)
Lymphs Abs: 1.1 10*3/uL (ref 0.7–4.0)
MCH: 26.8 pg (ref 26.0–34.0)
MCHC: 33.2 g/dL (ref 30.0–36.0)
MCV: 80.7 fL (ref 78.0–100.0)
MONOS PCT: 3 % (ref 3–12)
Monocytes Absolute: 0.2 10*3/uL (ref 0.1–1.0)
NEUTROS ABS: 5.4 10*3/uL (ref 1.7–7.7)
NEUTROS PCT: 80 % — AB (ref 43–77)
PLATELETS: 321 10*3/uL (ref 150–400)
RBC: 4.97 MIL/uL (ref 3.87–5.11)
RDW: 15.3 % (ref 11.5–15.5)
WBC: 6.8 10*3/uL (ref 4.0–10.5)

## 2014-04-13 LAB — URINALYSIS, ROUTINE W REFLEX MICROSCOPIC
BILIRUBIN URINE: NEGATIVE
Glucose, UA: NEGATIVE mg/dL
HGB URINE DIPSTICK: NEGATIVE
Ketones, ur: 15 mg/dL — AB
Nitrite: NEGATIVE
PH: 5.5 (ref 5.0–8.0)
Protein, ur: NEGATIVE mg/dL
SPECIFIC GRAVITY, URINE: 1.023 (ref 1.005–1.030)
Urobilinogen, UA: 0.2 mg/dL (ref 0.0–1.0)

## 2014-04-13 LAB — GLUCOSE, CAPILLARY: GLUCOSE-CAPILLARY: 109 mg/dL — AB (ref 70–99)

## 2014-04-13 LAB — URINE MICROSCOPIC-ADD ON

## 2014-04-13 LAB — LIPASE, BLOOD: LIPASE: 28 U/L (ref 11–59)

## 2014-04-13 LAB — TROPONIN I: Troponin I: <0.3 ng/mL (ref <0.30)

## 2014-04-13 LAB — LACTIC ACID, PLASMA: Lactic Acid, Venous: 1.3 mmol/L (ref 0.5–2.2)

## 2014-04-13 MED ORDER — SODIUM CHLORIDE 0.9 % IV BOLUS (SEPSIS)
500.0000 mL | Freq: Once | INTRAVENOUS | Status: AC
  Administered 2014-04-13: 500 mL via INTRAVENOUS

## 2014-04-13 MED ORDER — ENOXAPARIN SODIUM 40 MG/0.4ML ~~LOC~~ SOLN
40.0000 mg | SUBCUTANEOUS | Status: DC
Start: 2014-04-13 — End: 2014-04-15
  Administered 2014-04-13 – 2014-04-14 (×2): 40 mg via SUBCUTANEOUS
  Filled 2014-04-13 (×3): qty 0.4

## 2014-04-13 MED ORDER — PROMETHAZINE HCL 25 MG/ML IJ SOLN
12.5000 mg | Freq: Once | INTRAMUSCULAR | Status: AC
  Administered 2014-04-13: 12.5 mg via INTRAVENOUS
  Filled 2014-04-13: qty 1

## 2014-04-13 MED ORDER — ONDANSETRON HCL 4 MG PO TABS: 4.0000 mg | ORAL_TABLET | Freq: Four times a day (QID) | ORAL | Status: DC | PRN

## 2014-04-13 MED ORDER — ONDANSETRON HCL 4 MG/2ML IJ SOLN
4.0000 mg | Freq: Four times a day (QID) | INTRAMUSCULAR | Status: DC | PRN
  Administered 2014-04-13 – 2014-04-14 (×3): 4 mg via INTRAVENOUS
  Filled 2014-04-13 (×3): qty 2

## 2014-04-13 MED ORDER — METOCLOPRAMIDE HCL 5 MG/ML IJ SOLN
5.0000 mg | Freq: Four times a day (QID) | INTRAMUSCULAR | Status: DC
  Administered 2014-04-13 – 2014-04-15 (×8): 5 mg via INTRAVENOUS
  Filled 2014-04-13 (×13): qty 1

## 2014-04-13 MED ORDER — ACETAMINOPHEN 500 MG PO TABS: 1000.0000 mg | ORAL_TABLET | Freq: Four times a day (QID) | ORAL | Status: DC | PRN

## 2014-04-13 MED ORDER — SODIUM CHLORIDE 0.9 % IV SOLN
INTRAVENOUS | Status: DC
  Administered 2014-04-13 – 2014-04-15 (×3): via INTRAVENOUS

## 2014-04-13 MED ORDER — ATORVASTATIN CALCIUM 80 MG PO TABS
80.0000 mg | ORAL_TABLET | Freq: Every day | ORAL | Status: DC
  Administered 2014-04-13 – 2014-04-14 (×2): 80 mg via ORAL
  Filled 2014-04-13 (×3): qty 1

## 2014-04-13 MED ORDER — SODIUM CHLORIDE 0.9 % IV SOLN
INTRAVENOUS | Status: DC
  Administered 2014-04-13: 17:00:00 via INTRAVENOUS

## 2014-04-13 MED ORDER — LISINOPRIL 2.5 MG PO TABS
2.5000 mg | ORAL_TABLET | Freq: Every day | ORAL | Status: DC
  Administered 2014-04-13 – 2014-04-15 (×3): 2.5 mg via ORAL
  Filled 2014-04-13 (×3): qty 1

## 2014-04-13 MED ORDER — SPIRONOLACTONE 12.5 MG HALF TABLET
12.5000 mg | ORAL_TABLET | Freq: Every day | ORAL | Status: DC
  Administered 2014-04-14 – 2014-04-15 (×2): 12.5 mg via ORAL
  Filled 2014-04-13 (×2): qty 1

## 2014-04-13 MED ORDER — FENTANYL CITRATE 0.05 MG/ML IJ SOLN
50.0000 ug | Freq: Once | INTRAMUSCULAR | Status: AC
  Administered 2014-04-13: 50 ug via INTRAVENOUS
  Filled 2014-04-13: qty 2

## 2014-04-13 MED ORDER — MORPHINE SULFATE 2 MG/ML IJ SOLN
0.5000 mg | INTRAMUSCULAR | Status: DC | PRN
  Administered 2014-04-13: 0.5 mg via INTRAVENOUS
  Filled 2014-04-13: qty 1

## 2014-04-13 MED ORDER — INSULIN ASPART 100 UNIT/ML ~~LOC~~ SOLN
0.0000 [IU] | Freq: Three times a day (TID) | SUBCUTANEOUS | Status: DC
  Administered 2014-04-14 (×2): 2 [IU] via SUBCUTANEOUS
  Administered 2014-04-14: 1 [IU] via SUBCUTANEOUS
  Administered 2014-04-15: 2 [IU] via SUBCUTANEOUS

## 2014-04-13 MED ORDER — ASPIRIN 81 MG PO CHEW
81.0000 mg | CHEWABLE_TABLET | Freq: Every day | ORAL | Status: DC
  Administered 2014-04-13 – 2014-04-15 (×3): 81 mg via ORAL
  Filled 2014-04-13 (×3): qty 1

## 2014-04-13 MED ORDER — SODIUM CHLORIDE 0.9 % IJ SOLN
3.0000 mL | Freq: Two times a day (BID) | INTRAMUSCULAR | Status: DC
  Administered 2014-04-14: 3 mL via INTRAVENOUS

## 2014-04-13 MED ORDER — INSULIN GLARGINE 100 UNIT/ML ~~LOC~~ SOLN
15.0000 [IU] | Freq: Two times a day (BID) | SUBCUTANEOUS | Status: DC
  Administered 2014-04-14 – 2014-04-15 (×2): 15 [IU] via SUBCUTANEOUS
  Filled 2014-04-13 (×5): qty 0.15

## 2014-04-13 MED ORDER — CARVEDILOL 6.25 MG PO TABS
6.2500 mg | ORAL_TABLET | Freq: Two times a day (BID) | ORAL | Status: DC
  Administered 2014-04-14 – 2014-04-15 (×3): 6.25 mg via ORAL
  Filled 2014-04-13 (×7): qty 1

## 2014-04-13 MED ORDER — FUROSEMIDE 40 MG PO TABS
40.0000 mg | ORAL_TABLET | Freq: Two times a day (BID) | ORAL | Status: DC
  Administered 2014-04-13 – 2014-04-15 (×4): 40 mg via ORAL
  Filled 2014-04-13 (×7): qty 1

## 2014-04-13 MED ORDER — PANTOPRAZOLE SODIUM 40 MG IV SOLR
40.0000 mg | Freq: Once | INTRAVENOUS | Status: AC
  Administered 2014-04-13: 40 mg via INTRAVENOUS
  Filled 2014-04-13: qty 40

## 2014-04-13 MED ORDER — METOCLOPRAMIDE HCL 5 MG/ML IJ SOLN
10.0000 mg | Freq: Once | INTRAMUSCULAR | Status: AC
  Administered 2014-04-13: 10 mg via INTRAVENOUS
  Filled 2014-04-13: qty 2

## 2014-04-13 MED ORDER — ONDANSETRON HCL 4 MG/2ML IJ SOLN: 4.0000 mg | Freq: Three times a day (TID) | INTRAMUSCULAR | Status: DC | PRN

## 2014-04-13 MED ORDER — ONDANSETRON 4 MG PO TBDP
8.0000 mg | ORAL_TABLET | Freq: Once | ORAL | Status: AC
  Administered 2014-04-13: 8 mg via ORAL
  Filled 2014-04-13: qty 2

## 2014-04-13 MED ORDER — PANTOPRAZOLE SODIUM 40 MG IV SOLR
40.0000 mg | Freq: Two times a day (BID) | INTRAVENOUS | Status: DC
  Administered 2014-04-13 – 2014-04-14 (×3): 40 mg via INTRAVENOUS
  Filled 2014-04-13 (×5): qty 40

## 2014-04-13 NOTE — ED Notes (Signed)
Pt vomited scant amount of blood

## 2014-04-13 NOTE — ED Notes (Signed)
Pt. Stated, I started throwing up last night around 1200 and having stomach pain

## 2014-04-13 NOTE — ED Provider Notes (Signed)
CSN: [id number]     Arrival date & time 04/13/14  63 History   First MD Initiated Contact with Patient 04/13/14 1112     Chief Complaint  Patient presents with  . Emesis  . Abdominal Pain     HPI Patient presents with persistent nausea and vomiting since around midnight last night.  Initially symptoms started without abdominal pain but subsequently she has some generalized pain with vomiting.  Initially she had ileus for clear vomit.  She has noted a few blood streaks in her vomit in the last hour.  Patient denies melena or hematochezia.  Patient denies any fever or chills.  Denies dysuria urgency or frequency.   Past Medical History  Diagnosis Date  . Diabetes mellitus   . Hypertension   . Arthritis   . TTP (thrombotic thrombocytopenic purpura)   . Erosive esophagitis   . Gastritis   . Anemia   . Thyroid disease   . Duodenal ulcer     Age 63  . Obese   . Short-term memory loss   . Chronic systolic heart failure     a. ECHO (03/2014): EF 25-30%, akinesis enteroanteroseptal myocardium, grade III DD b. RHC (03/2014) RA 4, RV 42/4, PA 44/14 (26), PCWP 21, PA 62% Fick CO/CI 6.9 / 3.4  . Ischemic cardiomyopathy   . CAD (coronary artery disease)     a. LHC (03/2014) Lmain: nl, LAD: diff dz proximal 20%, 30-40% dz mid vessel prior 2nd diagonal, LCx: 40% in OM1, 20-30% in OM2, RCA: 99% subtotal occlusion @ crux, TIMI 1 flow, L-R collaterals to distal vessel   Past Surgical History  Procedure Laterality Date  . Splenectomy, partial    . Ectopic pregnancy surgery    . Esophagogastroduodenoscopy  04/20/2012    FWY:OVZCHYI antral gastritis/Erosive reflux esophagitis  . Colonoscopy  08/29/2012    FOY:DXAJOIN diverticulosis   Family History  Problem Relation Age of Onset  . Heart failure Mother   . Cirrhosis Father     ETOH  . Colon cancer Neg Hx   . Diabetes Daughter    History  Substance Use Topics  . Smoking status: Former Smoker -- 0.25 packs/day for 10 years    Types:  Cigarettes  . Smokeless tobacco: Not on file     Comment: Quit 2008  . Alcohol Use: No   OB History   Grav Para Term Preterm Abortions TAB SAB Ect Mult Living                 Review of Systems  All other systems reviewed and are negative   Allergies  Other  Home Medications   Prior to Admission medications   Medication Sig Start Date End Date Taking? Authorizing Provider  acetaminophen (TYLENOL) 500 MG tablet Take 1,000 mg by mouth every 6 (six) hours as needed for moderate pain.    Yes Historical Provider, MD  aspirin 81 MG chewable tablet Chew 1 tablet (81 mg total) by mouth daily. 03/31/14  Yes Charlynne Cousins, MD  atorvastatin (LIPITOR) 80 MG tablet Take 1 tablet (80 mg total) by mouth daily at 6 PM. 03/31/14  Yes Charlynne Cousins, MD  carvedilol (COREG) 6.25 MG tablet Take 1 tablet (6.25 mg total) by mouth 2 (two) times daily with a meal. 03/31/14  Yes Charlynne Cousins, MD  furosemide (LASIX) 40 MG tablet Take 1 tablet (40 mg total) by mouth 2 (two) times daily. 03/31/14  Yes Charlynne Cousins, MD  insulin glargine (LANTUS)  100 UNIT/ML injection Inject 35 Units into the skin 2 (two) times daily.   Yes Historical Provider, MD  lisinopril (PRINIVIL,ZESTRIL) 2.5 MG tablet Take 1 tablet (2.5 mg total) by mouth daily. 03/31/14  Yes Charlynne Cousins, MD  metoCLOPramide (REGLAN) 5 MG tablet Take 1 tablet (5 mg total) by mouth 4 (four) times daily. 07/10/12  Yes Alonza Bogus, MD  NOVOLOG 100 UNIT/ML injection Inject 10 Units into the skin 3 (three) times daily with meals.  03/15/14  Yes Historical Provider, MD  spironolactone (ALDACTONE) 25 MG tablet Take 0.5 tablets (12.5 mg total) by mouth daily. 04/04/14  Yes Rande Brunt, NP   BP 135/81  Pulse 94  Temp(Src) 97.8 F (36.6 C) (Oral)  Resp 15  Ht 5\' 5"  (1.651 m)  Wt 204 lb (92.534 kg)  BMI 33.95 kg/m2  SpO2 97% Physical Exam Physical Exam  Nursing note and vitals reviewed. Constitutional: She is oriented to  person, place, and time. She appears well-developed and well-nourished. No distress.  HENT:  Head: Normocephalic and atraumatic.  Eyes: Pupils are equal, round, and reactive to light.  Neck: Normal range of motion.  Cardiovascular: Normal rate and intact distal pulses.   Pulmonary/Chest: No respiratory distress.  Abdominal: Normal appearance. She exhibits no distension.  no focal tenderness.   No rebound guarding tenderness.  Normal bowel sounds. Musculoskeletal: Normal range of motion.  Neurological: She is alert and oriented to person, place, and time. No cranial nerve deficit.  Skin: Skin is warm and dry. No rash noted.  Psychiatric: She has a normal mood and affect. Her behavior is normal.   ED Course  Procedures (including critical care time) Medications  pantoprazole (PROTONIX) injection 40 mg (not administered)  fentaNYL (SUBLIMAZE) injection 50 mcg (not administered)  promethazine (PHENERGAN) injection 12.5 mg (not administered)  ondansetron (ZOFRAN-ODT) disintegrating tablet 8 mg (8 mg Oral Given 04/13/14 1102)  sodium chloride 0.9 % bolus 500 mL (0 mLs Intravenous Stopped 04/13/14 1303)  metoCLOPramide (REGLAN) injection 10 mg (10 mg Intravenous Given 04/13/14 1215)    Labs Review Labs Reviewed  CBC WITH DIFFERENTIAL - Abnormal; Notable for the following:    Neutrophils Relative % 80 (*)    All other components within normal limits  COMPREHENSIVE METABOLIC PANEL - Abnormal; Notable for the following:    Sodium 135 (*)    Potassium 5.9 (*)    Chloride 95 (*)    Glucose, Bld 186 (*)    Total Protein 9.1 (*)    AST 101 (*)    ALT 156 (*)    Anion gap 17 (*)    All other components within normal limits  COMPREHENSIVE METABOLIC PANEL - Abnormal; Notable for the following:    Glucose, Bld 177 (*)    Total Protein 8.4 (*)    AST 69 (*)    ALT 142 (*)    GFR calc non Af Amer 90 (*)    Anion gap 16 (*)    All other components within normal limits  LIPASE, BLOOD  LACTIC  ACID, PLASMA  URINALYSIS, ROUTINE W REFLEX MICROSCOPIC    Imaging Review Dg Abd Acute W/chest  04/13/2014   CLINICAL DATA:  Pain with nausea and vomiting  EXAM: ACUTE ABDOMEN SERIES (ABDOMEN 2 VIEW & CHEST 1 VIEW)  COMPARISON:  Chest radiograph March 26, 2014; CT abdomen and pelvis March 26, 2014  FINDINGS: PA chest: There is no edema or consolidation. Heart size and pulmonary vascularity are normal. No adenopathy.  Supine and upright abdomen: There are surgical clips in the left upper quadrant. There is diffuse stool throughout colon. The bowel gas pattern is unremarkable. No obstruction or free air. Calcifications are present and pelvis consistent with uterine leiomyomas. There also phleboliths in the pelvis.  IMPRESSION: There is fairly diffuse stool throughout colon. Overall bowel gas pattern is unremarkable. No obstruction or free air.  There are calcified leiomyomas in the uterus.  There is no lung edema or consolidation.   Electronically Signed   By: Lowella Grip M.D.   On: 04/13/2014 12:56      MDM   Final diagnoses:  Intractable vomiting with nausea, vomiting of unspecified type  Cardiomyopathy, ischemic  Gastroparesis        Dot Lanes, MD 04/13/14 [phone]

## 2014-04-13 NOTE — H&P (Signed)
[facility] History and Physical  [name] AJO:[id number] DOB: July 26, 1951 DOA: 04/13/2014  Referring physician: EDP PCP: Alonza Bogus, MD   Chief Complaint: Persistent nausea vomiting  HPI: [name] is a 63 y.o. female with history of gastroparesis, diabetes mellitus, GERD, duodenal ulcer, chronic systolic CHF/ischemic CM, hypertension, CAD, fatty liver who presents with above complaints. She states that she began vomiting after midnight>> and has had multiple episodes of mostly nonbloody emesis except for 1episode  Of blood-tinged emesis x1 in the ED. she reports a vague abdominal soreness after she been vomiting. She denies cough, fevers, diarrhea, melena and no hematochezia. Patient states that she usually takes her Reglan and 4 times a day but yesterday forgot and only took it 2 times. She was seen in the ED and Cmet revealed slightly elevated LFTs, lipase within normal limits, UA contaminated patient denies dysuria. Abdominal ultrasound was ordered the EDP and patient admitted for further evaluation and management. She denies chest pain and no shortness of breath.   Review of Systems The patient denies anorexia, fever, weight loss,, vision loss, decreased hearing, hoarseness, chest pain, syncope, dyspnea on exertion, peripheral edema, balance deficits, hemoptysis, abdominal pain, melena, hematochezia, , hematuria, incontinence, genital sores, muscle weakness, suspicious skin lesions, transient blindness, difficulty walking, depression, unusual weight change   Past Medical History  Diagnosis Date  . Diabetes mellitus   . Hypertension   . Arthritis   . TTP (thrombotic thrombocytopenic purpura)   . Erosive esophagitis   . Gastritis   . Anemia   . Thyroid disease   . Duodenal ulcer     Age 37  . Obese   . Short-term memory loss   . Chronic systolic heart failure     a. ECHO (03/2014): EF 25-30%, akinesis enteroanteroseptal myocardium, grade III DD b. RHC (03/2014) RA  4, RV 42/4, PA 44/14 (26), PCWP 21, PA 62% Fick CO/CI 6.9 / 3.4  . Ischemic cardiomyopathy   . CAD (coronary artery disease)     a. LHC (03/2014) Lmain: nl, LAD: diff dz proximal 20%, 30-40% dz mid vessel prior 2nd diagonal, LCx: 40% in OM1, 20-30% in OM2, RCA: 99% subtotal occlusion @ crux, TIMI 1 flow, L-R collaterals to distal vessel   Past Surgical History  Procedure Laterality Date  . Splenectomy, partial    . Ectopic pregnancy surgery    . Esophagogastroduodenoscopy  04/20/2012    NOB:[id number] antral gastritis/Erosive reflux esophagitis  . Colonoscopy  08/29/2012    OQH:UTMLYYT diverticulosis   Social History:  reports that she has quit smoking. Her smoking use included Cigarettes. She has a 2.5 pack-year smoking history. She does not have any smokeless tobacco history on file. She reports that she does not drink alcohol or use illicit drugs.  Allergies  Allergen Reactions  . Other Other (See Comments)    FLAGYL, CIPRO, PROTONIX taken at the same time caused patient to lose her breath and have trouble breathing, requiring hospital stay at [facility], 03/23/14-per patient.     Family History  Problem Relation Age of Onset  . Heart failure Mother   . Cirrhosis Father     ETOH  . Colon cancer Neg Hx   . Diabetes Daughter      Prior to Admission medications   Medication Sig Start Date End Date Taking? Authorizing Provider  acetaminophen (TYLENOL) 500 MG tablet Take 1,000 mg by mouth every 6 (six) hours as needed for moderate pain.    Yes Historical Provider, MD  aspirin 81 MG  chewable tablet Chew 1 tablet (81 mg total) by mouth daily. 03/31/14  Yes Charlynne Cousins, MD  atorvastatin (LIPITOR) 80 MG tablet Take 1 tablet (80 mg total) by mouth daily at 6 PM. 03/31/14  Yes Charlynne Cousins, MD  carvedilol (COREG) 6.25 MG tablet Take 1 tablet (6.25 mg total) by mouth 2 (two) times daily with a meal. 03/31/14  Yes Charlynne Cousins, MD  furosemide (LASIX) 40 MG tablet Take 1 tablet (40 mg  total) by mouth 2 (two) times daily. 03/31/14  Yes Charlynne Cousins, MD  insulin glargine (LANTUS) 100 UNIT/ML injection Inject 35 Units into the skin 2 (two) times daily.   Yes Historical Provider, MD  lisinopril (PRINIVIL,ZESTRIL) 2.5 MG tablet Take 1 tablet (2.5 mg total) by mouth daily. 03/31/14  Yes Charlynne Cousins, MD  metoCLOPramide (REGLAN) 5 MG tablet Take 1 tablet (5 mg total) by mouth 4 (four) times daily. 07/10/12  Yes Alonza Bogus, MD  NOVOLOG 100 UNIT/ML injection Inject 10 Units into the skin 3 (three) times daily with meals.  03/15/14  Yes Historical Provider, MD  spironolactone (ALDACTONE) 25 MG tablet Take 0.5 tablets (12.5 mg total) by mouth daily. 04/04/14  Yes Rande Brunt, NP   Physical Exam: Filed Vitals:   04/13/14 1717  BP: 112/74  Pulse: 97  Temp: 98.4 F (36.9 C)  Resp: 18    BP 112/74  Pulse 97  Temp(Src) 98.4 F (36.9 C) (Oral)  Resp 18  Ht 5\' 5"  (1.651 m)  Wt 93.2 kg (205 lb 7.5 oz)  BMI 34.19 kg/m2  SpO2 94% Constitutional: Vital signs reviewed.  Patient is a well-developed, obese  in no acute distress and cooperative with exam. Alert and  answers appropriately. .  Head: Normocephalic and atraumatic Ear: TM normal bilaterally Nose: No erythema or drainage noted.  Turbinates normal Mouth: no erythema or exudates,  slightly dry MM Eyes: PERRL, EOMI, conjunctivae normal, No scleral icterus.  Neck: Supple, Trachea midline normal ROM, No JVD, mass, thyromegaly, or carotid bruit present.  Cardiovascular: RRR, S1 normal, S2 normal, no MRG, pulses symmetric and intact bilaterally Pulmonary/Chest: normal respiratory effort, CTAB, no wheezes, rales, or rhonchi Abdominal: Soft.  mild diffuse tenderness, no rebound, non-distended, bowel sounds are normal, no masses, organomegaly, or guarding present.  GU: no CVA tenderness  extremities: No cyanosis and no edema  Neurological: A&O x3, Strength is normal and symmetric bilaterally, cranial nerve II-XII  are grossly intact, no focal motor deficit, sensory intact to light touch bilaterally.  Skin: Warm, dry and intact. No rash, cyanosis, or clubbing.  Psychiatric: Normal mood and affect. speech and behavior is normal.              Labs on Admission:  Basic Metabolic Panel:  Recent Labs Lab 04/13/14 1116 04/13/14 1235  NA 135* 139  K 5.9* 4.2  CL 95* 99  CO2 23 24  GLUCOSE 186* 177*  BUN 17 17  CREATININE 0.59 0.68  CALCIUM 9.6 9.3   Liver Function Tests:  Recent Labs Lab 04/13/14 1116 04/13/14 1235  AST 101* 69*  ALT 156* 142*  ALKPHOS 80 84  BILITOT 0.6 0.6  PROT 9.1* 8.4*  ALBUMIN 4.1 3.9    Recent Labs Lab 04/13/14 1116  LIPASE 28   No results found for this basename: AMMONIA,  in the last 168 hours CBC:  Recent Labs Lab 04/13/14 1116  WBC 6.8  NEUTROABS 5.4  HGB 13.3  HCT 40.1  MCV  80.7  PLT 321   Cardiac Enzymes: No results found for this basename: CKTOTAL, CKMB, CKMBINDEX, TROPONINI,  in the last 168 hours  BNP (last 3 results)  Recent Labs  03/23/14 0257 03/26/14 0320  PROBNP 1031.0* 1174.0*   CBG: No results found for this basename: GLUCAP,  in the last 168 hours  Radiological Exams on Admission: Dg Abd Acute W/chest  04/13/2014   CLINICAL DATA:  Pain with nausea and vomiting  EXAM: ACUTE ABDOMEN SERIES (ABDOMEN 2 VIEW & CHEST 1 VIEW)  COMPARISON:  Chest radiograph March 26, 2014; CT abdomen and pelvis March 26, 2014  FINDINGS: PA chest: There is no edema or consolidation. Heart size and pulmonary vascularity are normal. No adenopathy.  Supine and upright abdomen: There are surgical clips in the left upper quadrant. There is diffuse stool throughout colon. The bowel gas pattern is unremarkable. No obstruction or free air. Calcifications are present and pelvis consistent with uterine leiomyomas. There also phleboliths in the pelvis.  IMPRESSION: There is fairly diffuse stool throughout colon. Overall bowel gas pattern is unremarkable. No  obstruction or free air.  There are calcified leiomyomas in the uterus.  There is no lung edema or consolidation.   Electronically Signed   By: Lowella Grip M.D.   On: 04/13/2014 12:56     Assessment/Plan Active Problems:     Persistent Nausea & vomiting-likely secondary to Gastroparesis flare and GERD -As discussed above, some noncompliance with meds noted yesterday, will resume Reglan -Place Zofran when necessary, PPI and follow -Follow up on pending abdominal ultrasound ordered per EDP and further manage accordingly -Cycle troponins, Follow and recheck CBC   GERD (gastroesophageal reflux disease) -Place on PPI as above   Uncontrolled diabetes mellitus -Resume Lantus at half dose for now, monitor Accu-Cheks with sliding scale insulin   Coronary artery disease -She is chest pain-free, follow up on cardiac enzymes as above -Continue outpatient medications   HTN (hypertension) -Continue outpatient medications   Chronic combined systolic and diastolic CHF (congestive heart failure) -Compensated at this time, hold diuretics for today given #1 -Monitor strict I.'s and as, resume diuretics in a.m. and follow     Code Status: full  Family Communication: daughter at beside Disposition Plan: admitted to [place]  Time spent:>3mins  [facility] Pager [phone]

## 2014-04-13 NOTE — Progress Notes (Signed)
Pt admitted to the unit at 445pm. Pt mental status is alert and oriented x4. Pt oriented to room, staff, and call bell. Skin is intact. Full assessment charted in [facility]. Call bell within reach. Visitor guidelines reviewed w/ pt and/or family.

## 2014-04-14 DIAGNOSIS — K5900 Constipation, unspecified: Secondary | ICD-10-CM

## 2014-04-14 DIAGNOSIS — I509 Heart failure, unspecified: Secondary | ICD-10-CM

## 2014-04-14 DIAGNOSIS — I5042 Chronic combined systolic (congestive) and diastolic (congestive) heart failure: Secondary | ICD-10-CM

## 2014-04-14 LAB — COMPREHENSIVE METABOLIC PANEL
ALBUMIN: 3.4 g/dL — AB (ref 3.5–5.2)
ALT: 109 U/L — ABNORMAL HIGH (ref 0–35)
ANION GAP: 16 — AB (ref 5–15)
AST: 59 U/L — ABNORMAL HIGH (ref 0–37)
Alkaline Phosphatase: 71 U/L (ref 39–117)
BUN: 14 mg/dL (ref 6–23)
CO2: 22 mEq/L (ref 19–32)
CREATININE: 0.79 mg/dL (ref 0.50–1.10)
Calcium: 8.9 mg/dL (ref 8.4–10.5)
Chloride: 103 mEq/L (ref 96–112)
GFR calc non Af Amer: 86 mL/min — ABNORMAL LOW (ref >90)
Glucose, Bld: 89 mg/dL (ref 70–99)
Potassium: 4.1 mEq/L (ref 3.7–5.3)
Sodium: 141 mEq/L (ref 137–147)
TOTAL PROTEIN: 7.4 g/dL (ref 6.0–8.3)
Total Bilirubin: 0.6 mg/dL (ref 0.3–1.2)

## 2014-04-14 LAB — GLUCOSE, CAPILLARY
GLUCOSE-CAPILLARY: 73 mg/dL (ref 70–99)
Glucose-Capillary: 132 mg/dL — ABNORMAL HIGH (ref 70–99)
Glucose-Capillary: 157 mg/dL — ABNORMAL HIGH (ref 70–99)
Glucose-Capillary: 183 mg/dL — ABNORMAL HIGH (ref 70–99)

## 2014-04-14 LAB — CBC
HEMATOCRIT: 37.1 % (ref 36.0–46.0)
Hemoglobin: 11.8 g/dL — ABNORMAL LOW (ref 12.0–15.0)
MCH: 26.2 pg (ref 26.0–34.0)
MCHC: 31.8 g/dL (ref 30.0–36.0)
MCV: 82.3 fL (ref 78.0–100.0)
Platelets: 302 10*3/uL (ref 150–400)
RBC: 4.51 MIL/uL (ref 3.87–5.11)
RDW: 15.8 % — ABNORMAL HIGH (ref 11.5–15.5)
WBC: 6.8 10*3/uL (ref 4.0–10.5)

## 2014-04-14 LAB — TROPONIN I
Troponin I: <0.3 ng/mL (ref <0.30)
Troponin I: <0.3 ng/mL (ref <0.30)

## 2014-04-14 MED ORDER — SORBITOL 70 % SOLN
30.0000 mL | Status: AC
  Administered 2014-04-14: 30 mL via ORAL
  Filled 2014-04-14: qty 30

## 2014-04-14 MED ORDER — LUBIPROSTONE 24 MCG PO CAPS
24.0000 ug | ORAL_CAPSULE | Freq: Two times a day (BID) | ORAL | Status: DC
Start: 2014-04-14 — End: 2014-04-15
  Administered 2014-04-14 – 2014-04-15 (×3): 24 ug via ORAL
  Filled 2014-04-14 (×5): qty 1

## 2014-04-14 NOTE — Progress Notes (Signed)
Utilization review completed.  

## 2014-04-14 NOTE — Progress Notes (Signed)
[facility] PROGRESS NOTE  [name] YBW:[id number] DOB: 09/24/1950 DOA: 04/13/2014 PCP: Alonza Bogus, MD  Assessment/Plan: Persistent Nausea & vomiting-likely secondary to Gastroparesis flare and GERD  -Pt with hx of noncompliance with Reglan -On anti-emetic as needed -Serial trop neg -Abd US unremarkable -Stool noted throughout colon per official read and my own read - cont on bowel regimen as tolerated - large bm noted today post tx -Pt willing to try clear diet - will order and advance as tolerated GERD (gastroesophageal reflux disease)  -Place on PPI as above  Uncontrolled diabetes mellitus  -Resume Lantus at half dose for now, monitor Accu-Cheks with sliding scale insulin  Coronary artery disease  -She is chest pain-free, follow up on cardiac enzymes as above  -Continue outpatient medications  HTN (hypertension)  -Continue outpatient medications  Chronic combined systolic and diastolic CHF (congestive heart failure)  -Compensated at this time, hold diuretics for today given #1  -Monitor strict I.'s and as, resume diuretics in a.m. and follow   Code Status: Full Family Communication: Pt in room (indicate person spoken with, relationship, and if by phone, the number) Disposition Plan: Pending   Consultants:    Procedures:    Antibiotics:   (indicate start date, and stop date if known)  HPI/Subjective: Pt feels better. Wants to eat.  Objective: Filed Vitals:   04/13/14 1717 04/13/14 2003 04/13/14 2217 04/14/14 0434  BP: 112/74 117/80 98/64 107/72  Pulse: 97 105 96 88  Temp: 98.4 F (36.9 C) 99 F (37.2 C)  98.3 F (36.8 C)  TempSrc: Oral Oral  Oral  Resp: 18 18  18   Height: 5\' 5"  (1.651 m)     Weight: 93.2 kg (205 lb 7.5 oz)   93.713 kg (206 lb 9.6 oz)  SpO2: 94% 96%  96%    Intake/Output Summary (Last 24 hours) at 04/14/14 1427 Last data filed at 04/14/14 0600  Gross per 24 hour  Intake  562.5 ml  Output      0 ml  Net  562.5 ml    Filed Weights   04/13/14 1055 04/13/14 1717 04/14/14 0434  Weight: 92.534 kg (204 lb) 93.2 kg (205 lb 7.5 oz) 93.713 kg (206 lb 9.6 oz)    Exam:   General:  Awake, in nad  Cardiovascular: regular, s1, s2  Respiratory: normal resp effort, no wheezing  Abdomen: soft,nondistended  Musculoskeletal: perfused, no clubbing   Data Reviewed: Basic Metabolic Panel:  Recent Labs Lab 04/13/14 1116 04/13/14 1235 04/14/14 0335  NA 135* 139 141  K 5.9* 4.2 4.1  CL 95* 99 103  CO2 23 24 22   GLUCOSE 186* 177* 89  BUN 17 17 14   CREATININE 0.59 0.68 0.79  CALCIUM 9.6 9.3 8.9   Liver Function Tests:  Recent Labs Lab 04/13/14 1116 04/13/14 1235 04/14/14 0335  AST 101* 69* 59*  ALT 156* 142* 109*  ALKPHOS 80 84 71  BILITOT 0.6 0.6 0.6  PROT 9.1* 8.4* 7.4  ALBUMIN 4.1 3.9 3.4*    Recent Labs Lab 04/13/14 1116  LIPASE 28   No results found for this basename: AMMONIA,  in the last 168 hours CBC:  Recent Labs Lab 04/13/14 1116 04/14/14 0335  WBC 6.8 6.8  NEUTROABS 5.4  --   HGB 13.3 11.8*  HCT 40.1 37.1  MCV 80.7 82.3  PLT 321 302   Cardiac Enzymes:  Recent Labs Lab 04/13/14 2123 04/14/14 0332 04/14/14 0916  TROPONINI <0.30 <0.30 <0.30   BNP (last 3 results)  Recent Labs  03/23/14 0257 03/26/14 0320  PROBNP 1031.0* 1174.0*   CBG:  Recent Labs Lab 04/13/14 2118 04/14/14 0748 04/14/14 1144  GLUCAP 109* 132* 157*    No results found for this or any previous visit (from the past 240 hour(s)).   Studies: US Abdomen Complete  04/13/2014   CLINICAL DATA:  Elevated liver enzymes; nausea and vomiting  EXAM: ULTRASOUND ABDOMEN COMPLETE  COMPARISON:  CT abdomen and pelvis March 26, 2014  FINDINGS: Gallbladder:  No gallstones or wall thickening visualized. There is no pericholecystic fluid. No sonographic Murphy sign noted.  Common bile duct:  Diameter: 5 mm. There is no intrahepatic, common hepatic, or common bile duct dilatation.  Liver:  No focal  lesion identified. Liver echogenicity is somewhat inhomogeneous.  IVC:  No abnormality visualized.  Pancreas:  No mass or inflammatory focus.  Spleen:  Surgically absent.  Right Kidney:  Length: 10.8 cm. Echogenicity within normal limits. No mass or hydronephrosis visualized.  Left Kidney:  Length: 11.6 cm. Echogenicity within normal limits. No mass or hydronephrosis visualized.  Abdominal aorta:  No aneurysm visualized.  Other findings:  No demonstrable ascites.  IMPRESSION: Spleen absent. Liver echogenicity is inhomogeneous, probably due to underlying hepatic steatosis. While no focal liver lesions are identified, it must be cautioned that the sensitivity of ultrasound for focal liver lesions is diminished in this circumstance.  Study otherwise unremarkable.   Electronically Signed   By: Lowella Grip M.D.   On: 04/13/2014 19:47   Dg Abd Acute W/chest  04/13/2014   CLINICAL DATA:  Pain with nausea and vomiting  EXAM: ACUTE ABDOMEN SERIES (ABDOMEN 2 VIEW & CHEST 1 VIEW)  COMPARISON:  Chest radiograph March 26, 2014; CT abdomen and pelvis March 26, 2014  FINDINGS: PA chest: There is no edema or consolidation. Heart size and pulmonary vascularity are normal. No adenopathy.  Supine and upright abdomen: There are surgical clips in the left upper quadrant. There is diffuse stool throughout colon. The bowel gas pattern is unremarkable. No obstruction or free air. Calcifications are present and pelvis consistent with uterine leiomyomas. There also phleboliths in the pelvis.  IMPRESSION: There is fairly diffuse stool throughout colon. Overall bowel gas pattern is unremarkable. No obstruction or free air.  There are calcified leiomyomas in the uterus.  There is no lung edema or consolidation.   Electronically Signed   By: Lowella Grip M.D.   On: 04/13/2014 12:56    Scheduled Meds: . aspirin  81 mg Oral Daily  . atorvastatin  80 mg Oral q1800  . carvedilol  6.25 mg Oral BID WC  . enoxaparin (LOVENOX) injection   40 mg Subcutaneous Q24H  . furosemide  40 mg Oral BID  . insulin aspart  0-9 Units Subcutaneous TID WC  . insulin glargine  15 Units Subcutaneous BID  . lisinopril  2.5 mg Oral Daily  . lubiprostone  24 mcg Oral BID WC  . metoCLOPramide (REGLAN) injection  5 mg Intravenous 4 times per day  . pantoprazole (PROTONIX) IV  40 mg Intravenous Q12H  . sodium chloride  3 mL Intravenous Q12H  . spironolactone  12.5 mg Oral Daily   Continuous Infusions: . sodium chloride 50 mL/hr at 04/14/14 1056    Active Problems:   GERD (gastroesophageal reflux disease)   Uncontrolled diabetes mellitus   Gastroparesis   Coronary artery disease   HTN (hypertension)   Chronic combined systolic and diastolic CHF (congestive heart failure)   Nausea & vomiting  Time spent: 35min    Cormick Moss, [facility] Pager [phone]. If 7PM-7AM, please contact night-coverage at [email or web address], password [facility] 04/14/2014, 2:27 PM  LOS: 1 day

## 2014-04-14 NOTE — Progress Notes (Signed)
Pt CBG 73 and has order for 15 units of lantus. Rogue Bussing, NP paged whether or not to hold lantus. NP instructed to hold lantus. Juice given to pt. Will continue to monitor.

## 2014-04-15 DIAGNOSIS — E1142 Type 2 diabetes mellitus with diabetic polyneuropathy: Secondary | ICD-10-CM

## 2014-04-15 DIAGNOSIS — I428 Other cardiomyopathies: Secondary | ICD-10-CM

## 2014-04-15 DIAGNOSIS — K3184 Gastroparesis: Principal | ICD-10-CM

## 2014-04-15 DIAGNOSIS — E1149 Type 2 diabetes mellitus with other diabetic neurological complication: Secondary | ICD-10-CM

## 2014-04-15 LAB — GLUCOSE, CAPILLARY
Glucose-Capillary: 107 mg/dL — ABNORMAL HIGH (ref 70–99)
Glucose-Capillary: 156 mg/dL — ABNORMAL HIGH (ref 70–99)

## 2014-04-15 MED ORDER — METOCLOPRAMIDE HCL 5 MG PO TABS: 5.0000 mg | ORAL_TABLET | Freq: Three times a day (TID) | ORAL | Status: DC

## 2014-04-15 MED ORDER — POLYETHYLENE GLYCOL 3350 17 GM/SCOOP PO POWD: 17.0000 g | Freq: Once | ORAL | Status: DC

## 2014-04-15 MED ORDER — PANTOPRAZOLE SODIUM 40 MG PO TBEC
40.0000 mg | DELAYED_RELEASE_TABLET | Freq: Every day | ORAL | Status: DC
  Administered 2014-04-15: 40 mg via ORAL

## 2014-04-15 NOTE — Progress Notes (Signed)
CARE MANAGEMENT NOTE 04/15/2014  Patient:  JUSTINA, BERTINI   Account Number:  [id number]  Date Initiated:  04/15/2014  Documentation initiated by:  [facility]  Subjective/Objective Assessment:   admitted with nausea, vomiting     Action/Plan:   acive with [facility] for [facility] and HHPT   Anticipated DC Date:  04/15/2014   Anticipated DC Plan:  [place]  CM consult      [facility] Choice  Resumption Of Svcs/PTA Provider   Choice offered to / List presented to:          [facility] arranged  HH-1 RN  [place].   Status of service:  Completed, signed off Medicare Important Message given?   (If response is "NO", the following Medicare IM given date fields will be blank) Date Medicare IM given:   Medicare IM given by:   Date Additional Medicare IM given:   Additional Medicare IM given by:    Discharge Disposition:  [place]  Per UR Regulation:    If discussed at Long Length of Stay Meetings, dates discussed:    Comments:  04/15/14 [place] resumed with [facility]. Fuller Plan RN, BSN, CCM

## 2014-04-15 NOTE — Progress Notes (Signed)
[name] to be D/C'd Home per MD order.  Discussed with the patient and all questions fully answered.    Medication List         acetaminophen 500 MG tablet  Commonly known as:  TYLENOL  Take 1,000 mg by mouth every 6 (six) hours as needed for moderate pain.     aspirin 81 MG chewable tablet  Chew 1 tablet (81 mg total) by mouth daily.     atorvastatin 80 MG tablet  Commonly known as:  LIPITOR  Take 1 tablet (80 mg total) by mouth daily at 6 PM.     carvedilol 6.25 MG tablet  Commonly known as:  COREG  Take 1 tablet (6.25 mg total) by mouth 2 (two) times daily with a meal.     furosemide 40 MG tablet  Commonly known as:  LASIX  Take 1 tablet (40 mg total) by mouth 2 (two) times daily.     insulin glargine 100 UNIT/ML injection  Commonly known as:  LANTUS  Inject 35 Units into the skin 2 (two) times daily.     lisinopril 2.5 MG tablet  Commonly known as:  PRINIVIL,ZESTRIL  Take 1 tablet (2.5 mg total) by mouth daily.     metoCLOPramide 5 MG tablet  Commonly known as:  REGLAN  Take 1 tablet (5 mg total) by mouth 4 (four) times daily -  before meals and at bedtime.     NOVOLOG 100 UNIT/ML injection  Generic drug:  insulin aspart  Inject 10 Units into the skin 3 (three) times daily with meals.     polyethylene glycol powder powder  Commonly known as:  GLYCOLAX/MIRALAX  Take 17 g by mouth once. Each day in no more than 1/2 cup of liquid (4 ounces)     spironolactone 25 MG tablet  Commonly known as:  ALDACTONE  Take 0.5 tablets (12.5 mg total) by mouth daily.        VVS, Skin clean, dry and intact without evidence of skin break down, no evidence of skin tears noted. IV catheter discontinued intact. Site without signs and symptoms of complications. Dressing and pressure applied.  An After Visit Summary was printed and given to the patient.  D/c education completed with patient/family including follow up instructions, medication list, d/c activities limitations  if indicated, with other d/c instructions as indicated by MD - patient able to verbalize understanding, all questions fully answered.   Patient instructed to return to ED, call 911, or call MD for any changes in condition.   Patient escorted via [place], and D/C home via private auto.  Delman Cheadle 04/15/2014 2:45 PM]

## 2014-04-15 NOTE — Discharge Summary (Signed)
Physician Discharge Summary  [name] WFU:[id number] DOB: 11-Jul-1951 DOA: 04/13/2014  PCP: Alonza Bogus, MD  Admit date: 04/13/2014 Discharge date: 04/15/2014  Time spent: 45 minutes  Recommendations for Outpatient Follow-up:  1. Continue diabetes management and gastroparesis follow up. 2. Patient with significant stool burden despite having daily BMs.  Rx daily miralax.  Discharge Diagnoses:  Active Problems:   GERD (gastroesophageal reflux disease)   Uncontrolled diabetes mellitus   Gastroparesis   Coronary artery disease   HTN (hypertension)   Chronic combined systolic and diastolic CHF (congestive heart failure)   Nausea & vomiting   Discharge Condition: stable.  Diet recommendation: low residue  Filed Weights   04/13/14 1717 04/14/14 0434 04/15/14 0548  Weight: 93.2 kg (205 lb 7.5 oz) 93.713 kg (206 lb 9.6 oz) 93.5 kg (206 lb 2.1 oz)    History of present illness:  [name] is a 63 y.o. female with history of gastroparesis, diabetes mellitus, GERD, duodenal ulcer, chronic systolic CHF/ischemic CM, hypertension, CAD, fatty liver who presents with persistent nausea and vomiting.  Patient states that she usually takes her Reglan and 4 times a day but yesterday forgot and only took it 2 times. She was seen in the ED and Cmet revealed slightly elevated LFTs, lipase within normal limits, UA contaminated patient denies dysuria.   Hospital Course:   Persistent Nausea & vomiting-likely secondary to Gastroparesis flare and GERD  Pt with hx of noncompliance with Reglan  Received supportive care and reglan for vomiting. Serial trop neg  Abd US unremarkable  Abdominal xray showed noticeable stool burden.  The patient received stool softeners and had several large BMs.  Symptoms improved. Patient was initially NPO.  After her vomiting ceased, her diet was slowly advanced and she tolerated solid food  GERD (gastroesophageal reflux disease)  Place on PPI as above    Uncontrolled diabetes mellitus  Hgb A1C was elevated in May.  Will continue on prior to admission insulin regimen. PCP follow up.  Coronary artery disease  She is chest pain-free Continue on outpatient medication regimen.  HTN (hypertension)  stable. outpatient medications were continued.   Chronic combined systolic and diastolic CHF (congestive heart failure)  Compensated at this time, diuretics were held initially due to vomiting.  They will be resumed on discharge.  Discharge Exam: Filed Vitals:   04/15/14 1419  BP: 90/61  Pulse: 89  Temp: 98.1 F (36.7 C)  Resp: 17   General: Awake, in nad.  Walking about the room. Cardiovascular: regular, s1, s2.  No lower extremity edema Respiratory: normal resp effort, no wheezing  Abdomen: soft,nondistended, no tender. +bs. Musculoskeletal: perfused, no clubbing.  Able to ambulate with ease   Discharge Instructions       Discharge Instructions   Diet general    Complete by:  As directed   Gastro paresis diet (avoid lettuce and high fiber foods)     Increase activity slowly    Complete by:  As directed             Medication List         acetaminophen 500 MG tablet  Commonly known as:  TYLENOL  Take 1,000 mg by mouth every 6 (six) hours as needed for moderate pain.     aspirin 81 MG chewable tablet  Chew 1 tablet (81 mg total) by mouth daily.     atorvastatin 80 MG tablet  Commonly known as:  LIPITOR  Take 1 tablet (80 mg total) by mouth daily at  6 PM.     carvedilol 6.25 MG tablet  Commonly known as:  COREG  Take 1 tablet (6.25 mg total) by mouth 2 (two) times daily with a meal.     furosemide 40 MG tablet  Commonly known as:  LASIX  Take 1 tablet (40 mg total) by mouth 2 (two) times daily.     insulin glargine 100 UNIT/ML injection  Commonly known as:  LANTUS  Inject 35 Units into the skin 2 (two) times daily.     lisinopril 2.5 MG tablet  Commonly known as:  PRINIVIL,ZESTRIL  Take 1 tablet (2.5  mg total) by mouth daily.     metoCLOPramide 5 MG tablet  Commonly known as:  REGLAN  Take 1 tablet (5 mg total) by mouth 4 (four) times daily -  before meals and at bedtime.     NOVOLOG 100 UNIT/ML injection  Generic drug:  insulin aspart  Inject 10 Units into the skin 3 (three) times daily with meals.     polyethylene glycol powder powder  Commonly known as:  GLYCOLAX/MIRALAX  Take 17 g by mouth once. Each day in no more than 1/2 cup of liquid (4 ounces)     spironolactone 25 MG tablet  Commonly known as:  ALDACTONE  Take 0.5 tablets (12.5 mg total) by mouth daily.       Allergies  Allergen Reactions  . Other Other (See Comments)    FLAGYL, CIPRO, PROTONIX taken at the same time caused patient to lose her breath and have trouble breathing, requiring hospital stay at [facility], 03/23/14-per patient.    Follow-up Information   Follow up with HAWKINS,EDWARD L, MD In 1 week.   Specialty:  Pulmonary Disease   Contact information:   [address] [place] [address] [phone]        The results of significant diagnostics from this hospitalization (including imaging, microbiology, ancillary and laboratory) are listed below for reference.    Significant Diagnostic Studies:  US Abdomen Complete  04/13/2014   CLINICAL DATA:  Elevated liver enzymes; nausea and vomiting  EXAM: ULTRASOUND ABDOMEN COMPLETE  COMPARISON:  CT abdomen and pelvis March 26, 2014  FINDINGS: Gallbladder:  No gallstones or wall thickening visualized. There is no pericholecystic fluid. No sonographic Murphy sign noted.  Common bile duct:  Diameter: 5 mm. There is no intrahepatic, common hepatic, or common bile duct dilatation.  Liver:  No focal lesion identified. Liver echogenicity is somewhat inhomogeneous.  IVC:  No abnormality visualized.  Pancreas:  No mass or inflammatory focus.  Spleen:  Surgically absent.  Right Kidney:  Length: 10.8 cm. Echogenicity within normal limits. No mass or  hydronephrosis visualized.  Left Kidney:  Length: 11.6 cm. Echogenicity within normal limits. No mass or hydronephrosis visualized.  Abdominal aorta:  No aneurysm visualized.  Other findings:  No demonstrable ascites.  IMPRESSION: Spleen absent. Liver echogenicity is inhomogeneous, probably due to underlying hepatic steatosis. While no focal liver lesions are identified, it must be cautioned that the sensitivity of ultrasound for focal liver lesions is diminished in this circumstance.  Study otherwise unremarkable.   Electronically Signed   By: Lowella Grip M.D.   On: 04/13/2014 19:47    Dg Abd Acute W/chest  04/13/2014   CLINICAL DATA:  Pain with nausea and vomiting  EXAM: ACUTE ABDOMEN SERIES (ABDOMEN 2 VIEW & CHEST 1 VIEW)  COMPARISON:  Chest radiograph March 26, 2014; CT abdomen and pelvis March 26, 2014  FINDINGS: PA chest:  There is no edema or consolidation. Heart size and pulmonary vascularity are normal. No adenopathy.  Supine and upright abdomen: There are surgical clips in the left upper quadrant. There is diffuse stool throughout colon. The bowel gas pattern is unremarkable. No obstruction or free air. Calcifications are present and pelvis consistent with uterine leiomyomas. There also phleboliths in the pelvis.  IMPRESSION: There is fairly diffuse stool throughout colon. Overall bowel gas pattern is unremarkable. No obstruction or free air.  There are calcified leiomyomas in the uterus.  There is no lung edema or consolidation.   Electronically Signed   By: Lowella Grip M.D.   On: 04/13/2014 12:56    Labs: Basic Metabolic Panel:  Recent Labs Lab 04/13/14 1116 04/13/14 1235 04/14/14 0335  NA 135* 139 141  K 5.9* 4.2 4.1  CL 95* 99 103  CO2 23 24 22   GLUCOSE 186* 177* 89  BUN 17 17 14   CREATININE 0.59 0.68 0.79  CALCIUM 9.6 9.3 8.9   Liver Function Tests:  Recent Labs Lab 04/13/14 1116 04/13/14 1235 04/14/14 0335  AST 101* 69* 59*  ALT 156* 142* 109*  ALKPHOS 80 84 71   BILITOT 0.6 0.6 0.6  PROT 9.1* 8.4* 7.4  ALBUMIN 4.1 3.9 3.4*    Recent Labs Lab 04/13/14 1116  LIPASE 28   CBC:  Recent Labs Lab 04/13/14 1116 04/14/14 0335  WBC 6.8 6.8  NEUTROABS 5.4  --   HGB 13.3 11.8*  HCT 40.1 37.1  MCV 80.7 82.3  PLT 321 302   Cardiac Enzymes:  Recent Labs Lab 04/13/14 2123 04/14/14 0332 04/14/14 0916  TROPONINI <0.30 <0.30 <0.30   BNP: BNP (last 3 results)  Recent Labs  03/23/14 0257 03/26/14 0320  PROBNP 1031.0* 1174.0*   CBG:  Recent Labs Lab 04/14/14 1144 04/14/14 1704 04/14/14 2140 04/15/14 0757 04/15/14 1218  GLUCAP 157* 183* 73 107* 156*       Signed:  Karen Kitchens [phone]  [facility] 04/15/2014, 2:35 PM

## 2014-04-15 NOTE — Progress Notes (Signed)
[facility]  Patient Status: Active (receiving services up to time of hospitalization)  [facility] is providing the following services: RN and PT  If patient discharges after hours, please call [phone].   [name] 04/15/2014, 9:58 AM

## 2014-04-15 NOTE — Discharge Summary (Signed)
Pt seen and examined. Agree with note above. Briefly, pt presents with intractable n/v likely secondary to gastroparesis with constipation. Pt has noted much improvement with bowel regimen and is now tolerating a regular diet.

## 2014-04-16 DIAGNOSIS — I5043 Acute on chronic combined systolic (congestive) and diastolic (congestive) heart failure: Secondary | ICD-10-CM | POA: Diagnosis not present

## 2014-04-16 DIAGNOSIS — I2510 Atherosclerotic heart disease of native coronary artery without angina pectoris: Secondary | ICD-10-CM | POA: Diagnosis not present

## 2014-04-16 DIAGNOSIS — E669 Obesity, unspecified: Secondary | ICD-10-CM | POA: Diagnosis not present

## 2014-04-16 DIAGNOSIS — I509 Heart failure, unspecified: Secondary | ICD-10-CM | POA: Diagnosis not present

## 2014-04-16 DIAGNOSIS — E119 Type 2 diabetes mellitus without complications: Secondary | ICD-10-CM | POA: Diagnosis not present

## 2014-04-16 DIAGNOSIS — I10 Essential (primary) hypertension: Secondary | ICD-10-CM | POA: Diagnosis not present

## 2014-04-16 NOTE — Progress Notes (Signed)
Pt is aware of OV on 10/1 at 10 with LSL

## 2014-04-17 DIAGNOSIS — I5043 Acute on chronic combined systolic (congestive) and diastolic (congestive) heart failure: Secondary | ICD-10-CM | POA: Diagnosis not present

## 2014-04-17 DIAGNOSIS — E669 Obesity, unspecified: Secondary | ICD-10-CM | POA: Diagnosis not present

## 2014-04-17 DIAGNOSIS — I509 Heart failure, unspecified: Secondary | ICD-10-CM | POA: Diagnosis not present

## 2014-04-17 DIAGNOSIS — I2510 Atherosclerotic heart disease of native coronary artery without angina pectoris: Secondary | ICD-10-CM | POA: Diagnosis not present

## 2014-04-17 DIAGNOSIS — E119 Type 2 diabetes mellitus without complications: Secondary | ICD-10-CM | POA: Diagnosis not present

## 2014-04-17 DIAGNOSIS — I10 Essential (primary) hypertension: Secondary | ICD-10-CM | POA: Diagnosis not present

## 2014-04-18 ENCOUNTER — Ambulatory Visit (HOSPITAL_COMMUNITY)
Admission: RE | Admit: 2014-04-18 | Discharge: 2014-04-18 | Disposition: A | Discharge status: Home / Self Care | Payer: Medicare Other | Source: Ambulatory Visit | Attending: Internal Medicine | Admitting: Internal Medicine

## 2014-04-18 ENCOUNTER — Encounter (HOSPITAL_COMMUNITY): Payer: Self-pay

## 2014-04-18 VITALS — BP 105/58 | HR 87 | Resp 18 | Wt 204.0 lb

## 2014-04-18 DIAGNOSIS — E669 Obesity, unspecified: Secondary | ICD-10-CM | POA: Insufficient documentation

## 2014-04-18 DIAGNOSIS — I10 Essential (primary) hypertension: Secondary | ICD-10-CM | POA: Diagnosis not present

## 2014-04-18 DIAGNOSIS — I5022 Chronic systolic (congestive) heart failure: Secondary | ICD-10-CM | POA: Diagnosis not present

## 2014-04-18 DIAGNOSIS — I2510 Atherosclerotic heart disease of native coronary artery without angina pectoris: Secondary | ICD-10-CM | POA: Insufficient documentation

## 2014-04-18 DIAGNOSIS — Z87891 Personal history of nicotine dependence: Secondary | ICD-10-CM | POA: Diagnosis not present

## 2014-04-18 DIAGNOSIS — I5042 Chronic combined systolic (congestive) and diastolic (congestive) heart failure: Secondary | ICD-10-CM | POA: Diagnosis not present

## 2014-04-18 DIAGNOSIS — Z91199 Patient's noncompliance with other medical treatment and regimen due to unspecified reason: Secondary | ICD-10-CM | POA: Insufficient documentation

## 2014-04-18 DIAGNOSIS — Z794 Long term (current) use of insulin: Secondary | ICD-10-CM | POA: Diagnosis not present

## 2014-04-18 DIAGNOSIS — I2589 Other forms of chronic ischemic heart disease: Secondary | ICD-10-CM | POA: Diagnosis not present

## 2014-04-18 DIAGNOSIS — E119 Type 2 diabetes mellitus without complications: Secondary | ICD-10-CM | POA: Diagnosis not present

## 2014-04-18 DIAGNOSIS — I509 Heart failure, unspecified: Secondary | ICD-10-CM | POA: Diagnosis not present

## 2014-04-18 DIAGNOSIS — Z9119 Patient's noncompliance with other medical treatment and regimen: Secondary | ICD-10-CM | POA: Insufficient documentation

## 2014-04-18 DIAGNOSIS — I255 Ischemic cardiomyopathy: Secondary | ICD-10-CM

## 2014-04-18 MED ORDER — FUROSEMIDE 40 MG PO TABS: 40.0000 mg | ORAL_TABLET | Freq: Two times a day (BID) | ORAL | Status: DC

## 2014-04-18 MED ORDER — LISINOPRIL 2.5 MG PO TABS: 2.5000 mg | ORAL_TABLET | Freq: Two times a day (BID) | ORAL | Status: DC

## 2014-04-18 NOTE — Patient Instructions (Signed)
Doing great.  Increase your lisinopril to 2.5 mg (1 tablet) twice a day.  Continue to exercise as much as possible.  Call any issues.  Follow up in 1 month  Do the following things EVERYDAY: 1) Weigh yourself in the morning before breakfast. Write it down and keep it in a log. 2) Take your medicines as prescribed 3) Eat low salt foods-Limit salt (sodium) to 2000 mg per day.  4) Stay as active as you can everyday 5) Limit all fluids for the day to less than 2 liters 6)

## 2014-04-18 NOTE — Addendum Note (Signed)
Encounter addended by: Rande Brunt, NP on: 04/18/2014  3:34 PM<BR>     Documentation filed: Follow-up Section

## 2014-04-18 NOTE — Progress Notes (Addendum)
Patient ID: [name], female   DOB: April 24, 1951, 63 y.o.   MRN: [id number] PCP: Dr. Luan Pulling Linna Hoff) Primary Cardiologist: Dr. [name]  HPI: Mrs. Hsu is a 63 yo female with a history of DM2, obesity, TTP, HTN, ICM, CAD and chronic systolic HF.   Admitted 7/4-7/12/15 for ARF and was intubated. ECHO showed depressed EF and was diuresed and extubated. Underwent R/LHC showing single vessel obstructive CAD with subtotal occlusion RCA @ crux and mildly elevated pressures. Discharge weight 212 lbs.   [facility] Follow up for Heart Failure: Admitted for gastroparesis and persistent nausea/vomiting. Doing ok. Denies SOB, PND, orthopnea or CP. Has dry cough. Weight at home 204-206 lbs. Has HHRN/PT. No heart palpitations. Following a low salt diet and drinking less than 2L a day.    ROS: All systems negative except as listed in HPI, PMH and Problem List.  SH:  History   Social History  . Marital Status: Divorced    Spouse Name: N/A    Number of Children: 3  . Years of Education: N/A   Occupational History  . disabled    Social History Main Topics  . Smoking status: Former Smoker -- 0.25 packs/day for 10 years    Types: Cigarettes  . Smokeless tobacco: Not on file     Comment: Quit 2008  . Alcohol Use: No  . Drug Use: No  . Sexual Activity: Not on file   Other Topics Concern  . Not on file   Social History Narrative   Lives w/ daughter, [name]          FH:  Family History  Problem Relation Age of Onset  . Heart failure Mother   . Cirrhosis Father     ETOH  . Colon cancer Neg Hx   . Diabetes Daughter     Past Medical History  Diagnosis Date  . Diabetes mellitus   . Hypertension   . Arthritis   . TTP (thrombotic thrombocytopenic purpura)   . Erosive esophagitis   . Gastritis   . Anemia   . Thyroid disease   . Duodenal ulcer     Age 41  . Obese   . Short-term memory loss   . Chronic systolic heart failure     a. ECHO (03/2014): EF 25-30%,  akinesis enteroanteroseptal myocardium, grade III DD b. RHC (03/2014) RA 4, RV 42/4, PA 44/14 (26), PCWP 21, PA 62% Fick CO/CI 6.9 / 3.4  . Ischemic cardiomyopathy   . CAD (coronary artery disease)     a. LHC (03/2014) Lmain: nl, LAD: diff dz proximal 20%, 30-40% dz mid vessel prior 2nd diagonal, LCx: 40% in OM1, 20-30% in OM2, RCA: 99% subtotal occlusion @ crux, TIMI 1 flow, L-R collaterals to distal vessel    Current Outpatient Prescriptions  Medication Sig Dispense Refill  . acetaminophen (TYLENOL) 500 MG tablet Take 1,000 mg by mouth every 6 (six) hours as needed for moderate pain.       Marland Kitchen aspirin 81 MG chewable tablet Chew 1 tablet (81 mg total) by mouth daily.      Marland Kitchen atorvastatin (LIPITOR) 80 MG tablet Take 1 tablet (80 mg total) by mouth daily at 6 PM.  30 tablet  0  . carvedilol (COREG) 6.25 MG tablet Take 1 tablet (6.25 mg total) by mouth 2 (two) times daily with a meal.  60 tablet  0  . furosemide (LASIX) 40 MG tablet Take 1 tablet (40 mg total) by mouth 2 (two) times daily.  30 tablet  0  . insulin glargine (LANTUS) 100 UNIT/ML injection Inject 35 Units into the skin 2 (two) times daily.      Marland Kitchen lisinopril (PRINIVIL,ZESTRIL) 2.5 MG tablet Take 1 tablet (2.5 mg total) by mouth daily.  30 tablet  0  . metoCLOPramide (REGLAN) 5 MG tablet Take 1 tablet (5 mg total) by mouth 4 (four) times daily -  before meals and at bedtime.  120 tablet  5  . NOVOLOG 100 UNIT/ML injection Inject 10 Units into the skin 3 (three) times daily with meals.       . polyethylene glycol powder (GLYCOLAX/MIRALAX) powder Take 17 g by mouth once. Each day in no more than 1/2 cup of liquid (4 ounces)  255 g  6  . spironolactone (ALDACTONE) 25 MG tablet Take 0.5 tablets (12.5 mg total) by mouth daily.  15 tablet  3   No current facility-administered medications for this encounter.    Filed Vitals:   04/18/14 1501  BP: 105/58  Pulse: 87  Resp: 18  Weight: 204 lb (92.534 kg)  SpO2: 98%    PHYSICAL  EXAM:  General:  Well appearing. No resp difficulty, daughter present HEENT: normal Neck: supple. JVP flat. Carotids 2+ bilaterally; no bruits. No lymphadenopathy or thryomegaly appreciated. Cor: PMI normal. Regular rate & rhythm. No rubs, gallops or murmurs. Lungs: diminished in the bases. Abdomen: obese, soft, nontender, nondistended. No hepatosplenomegaly. No bruits or masses. Good bowel sounds. Extremities: no cyanosis, clubbing, rash, edema, cool extremities, 2+ pedal pulses Neuro: alert & orientedx3, cranial nerves grossly intact. Moves all 4 extremities w/o difficulty. Affect pleasant.  ASSESSMENT & PLAN:  1) Chronic combined systolic/diastolic HF: ICM, EF 70-26% with grade III DD (03/2014) - Recently admitted for gastroparesis.  - NYHA II symptoms and volume status stable. Will continue lasix 40 mg BID. We discussed the use of sliding scale diuretics. - Will continue coreg 6.25 mg BID. - Increase lisinopril to 2.5 mg BID. Will check BMET in 7-10 days with [facility].  - Continue Spiro 12.5 mg daily.  - Will need repeat ECHO (06/2014) to assess EF. If remains less than 35% will need referral to EP for consideration of ICD preferably Medtronic d/t HF diagnostics.  - Discussed the role of the transitions clinic and that she needs to continue to follow up with primary cardiologist. Provided her with medication bag to bring medications in and pill box. Reinforced the need and importance of daily weights, a low sodium diet, and fluid restriction (less than 2 L a day). Instructed to call the HF clinic if weight increases more than 3 lbs overnight or 5 lbs in a week.  2) HTN - stable. As above will increase lisinopril. 3) CAD - LHC during hospitalization showing single vessel CAD with subtotal occlusion of RCA. Will continue statin, ACE-I, BB and ASA. Continue to follow with primary cardiologist. No s/s of ischemia.  4) Obesity - Discussed trying to lose weight by being more active and watching  portion sizes. She is participating in PT and is walking more. 5) ?OSA - Patient and daughter report she snores. Will eventually need sleep study. Will set up after she gets adjusted to being home and gets some of her medical appointments out of the way. 6) Uncontrolled DM - Discussed the need to continue to follow sugars closely. Follow up with PCP.  F/U 1 month Junie Bame B NP-C 3:15 PM

## 2014-04-20 DIAGNOSIS — E669 Obesity, unspecified: Secondary | ICD-10-CM | POA: Diagnosis not present

## 2014-04-20 DIAGNOSIS — I10 Essential (primary) hypertension: Secondary | ICD-10-CM | POA: Diagnosis not present

## 2014-04-20 DIAGNOSIS — I5043 Acute on chronic combined systolic (congestive) and diastolic (congestive) heart failure: Secondary | ICD-10-CM | POA: Diagnosis not present

## 2014-04-20 DIAGNOSIS — I2510 Atherosclerotic heart disease of native coronary artery without angina pectoris: Secondary | ICD-10-CM | POA: Diagnosis not present

## 2014-04-20 DIAGNOSIS — E119 Type 2 diabetes mellitus without complications: Secondary | ICD-10-CM | POA: Diagnosis not present

## 2014-04-20 DIAGNOSIS — I509 Heart failure, unspecified: Secondary | ICD-10-CM | POA: Diagnosis not present

## 2014-04-22 DIAGNOSIS — E109 Type 1 diabetes mellitus without complications: Secondary | ICD-10-CM | POA: Diagnosis not present

## 2014-04-22 DIAGNOSIS — K3184 Gastroparesis: Secondary | ICD-10-CM | POA: Diagnosis not present

## 2014-04-22 DIAGNOSIS — I110 Hypertensive heart disease with heart failure: Secondary | ICD-10-CM | POA: Diagnosis not present

## 2014-04-22 DIAGNOSIS — I509 Heart failure, unspecified: Secondary | ICD-10-CM | POA: Diagnosis not present

## 2014-04-23 DIAGNOSIS — I5043 Acute on chronic combined systolic (congestive) and diastolic (congestive) heart failure: Secondary | ICD-10-CM | POA: Diagnosis not present

## 2014-04-23 DIAGNOSIS — I2510 Atherosclerotic heart disease of native coronary artery without angina pectoris: Secondary | ICD-10-CM | POA: Diagnosis not present

## 2014-04-23 DIAGNOSIS — I509 Heart failure, unspecified: Secondary | ICD-10-CM | POA: Diagnosis not present

## 2014-04-23 DIAGNOSIS — E119 Type 2 diabetes mellitus without complications: Secondary | ICD-10-CM | POA: Diagnosis not present

## 2014-04-23 DIAGNOSIS — I10 Essential (primary) hypertension: Secondary | ICD-10-CM | POA: Diagnosis not present

## 2014-04-23 DIAGNOSIS — E669 Obesity, unspecified: Secondary | ICD-10-CM | POA: Diagnosis not present

## 2014-04-24 DIAGNOSIS — I5043 Acute on chronic combined systolic (congestive) and diastolic (congestive) heart failure: Secondary | ICD-10-CM | POA: Diagnosis not present

## 2014-04-24 DIAGNOSIS — I10 Essential (primary) hypertension: Secondary | ICD-10-CM | POA: Diagnosis not present

## 2014-04-24 DIAGNOSIS — E119 Type 2 diabetes mellitus without complications: Secondary | ICD-10-CM | POA: Diagnosis not present

## 2014-04-24 DIAGNOSIS — E669 Obesity, unspecified: Secondary | ICD-10-CM | POA: Diagnosis not present

## 2014-04-24 DIAGNOSIS — I2510 Atherosclerotic heart disease of native coronary artery without angina pectoris: Secondary | ICD-10-CM | POA: Diagnosis not present

## 2014-04-24 DIAGNOSIS — I509 Heart failure, unspecified: Secondary | ICD-10-CM | POA: Diagnosis not present

## 2014-04-26 DIAGNOSIS — I5043 Acute on chronic combined systolic (congestive) and diastolic (congestive) heart failure: Secondary | ICD-10-CM | POA: Diagnosis not present

## 2014-04-26 DIAGNOSIS — I2510 Atherosclerotic heart disease of native coronary artery without angina pectoris: Secondary | ICD-10-CM | POA: Diagnosis not present

## 2014-04-26 DIAGNOSIS — E669 Obesity, unspecified: Secondary | ICD-10-CM | POA: Diagnosis not present

## 2014-04-26 DIAGNOSIS — I509 Heart failure, unspecified: Secondary | ICD-10-CM | POA: Diagnosis not present

## 2014-04-26 DIAGNOSIS — E119 Type 2 diabetes mellitus without complications: Secondary | ICD-10-CM | POA: Diagnosis not present

## 2014-04-26 DIAGNOSIS — I10 Essential (primary) hypertension: Secondary | ICD-10-CM | POA: Diagnosis not present

## 2014-04-29 DIAGNOSIS — E119 Type 2 diabetes mellitus without complications: Secondary | ICD-10-CM | POA: Diagnosis not present

## 2014-04-29 DIAGNOSIS — E669 Obesity, unspecified: Secondary | ICD-10-CM | POA: Diagnosis not present

## 2014-04-29 DIAGNOSIS — I10 Essential (primary) hypertension: Secondary | ICD-10-CM | POA: Diagnosis not present

## 2014-04-29 DIAGNOSIS — I5043 Acute on chronic combined systolic (congestive) and diastolic (congestive) heart failure: Secondary | ICD-10-CM | POA: Diagnosis not present

## 2014-04-29 DIAGNOSIS — I2510 Atherosclerotic heart disease of native coronary artery without angina pectoris: Secondary | ICD-10-CM | POA: Diagnosis not present

## 2014-04-29 DIAGNOSIS — I509 Heart failure, unspecified: Secondary | ICD-10-CM | POA: Diagnosis not present

## 2014-05-01 DIAGNOSIS — E119 Type 2 diabetes mellitus without complications: Secondary | ICD-10-CM | POA: Diagnosis not present

## 2014-05-01 DIAGNOSIS — I10 Essential (primary) hypertension: Secondary | ICD-10-CM | POA: Diagnosis not present

## 2014-05-01 DIAGNOSIS — I509 Heart failure, unspecified: Secondary | ICD-10-CM | POA: Diagnosis not present

## 2014-05-01 DIAGNOSIS — E669 Obesity, unspecified: Secondary | ICD-10-CM | POA: Diagnosis not present

## 2014-05-01 DIAGNOSIS — I5043 Acute on chronic combined systolic (congestive) and diastolic (congestive) heart failure: Secondary | ICD-10-CM | POA: Diagnosis not present

## 2014-05-01 DIAGNOSIS — I2510 Atherosclerotic heart disease of native coronary artery without angina pectoris: Secondary | ICD-10-CM | POA: Diagnosis not present

## 2014-05-02 ENCOUNTER — Telehealth: Payer: Self-pay | Admitting: *Deleted

## 2014-05-02 DIAGNOSIS — I509 Heart failure, unspecified: Secondary | ICD-10-CM | POA: Diagnosis not present

## 2014-05-02 DIAGNOSIS — I5043 Acute on chronic combined systolic (congestive) and diastolic (congestive) heart failure: Secondary | ICD-10-CM | POA: Diagnosis not present

## 2014-05-02 DIAGNOSIS — E669 Obesity, unspecified: Secondary | ICD-10-CM | POA: Diagnosis not present

## 2014-05-02 DIAGNOSIS — I10 Essential (primary) hypertension: Secondary | ICD-10-CM | POA: Diagnosis not present

## 2014-05-02 DIAGNOSIS — I2510 Atherosclerotic heart disease of native coronary artery without angina pectoris: Secondary | ICD-10-CM | POA: Diagnosis not present

## 2014-05-02 DIAGNOSIS — E119 Type 2 diabetes mellitus without complications: Secondary | ICD-10-CM | POA: Diagnosis not present

## 2014-05-02 NOTE — Telephone Encounter (Signed)
Curt Bears changed directions on lisinopril and they need Rx called in to [facility]

## 2014-05-02 NOTE — Telephone Encounter (Signed)
Spoke with home health,rx already in pharmacy

## 2014-05-03 DIAGNOSIS — I509 Heart failure, unspecified: Secondary | ICD-10-CM | POA: Diagnosis not present

## 2014-05-03 DIAGNOSIS — I5043 Acute on chronic combined systolic (congestive) and diastolic (congestive) heart failure: Secondary | ICD-10-CM | POA: Diagnosis not present

## 2014-05-03 DIAGNOSIS — I2510 Atherosclerotic heart disease of native coronary artery without angina pectoris: Secondary | ICD-10-CM | POA: Diagnosis not present

## 2014-05-03 DIAGNOSIS — I10 Essential (primary) hypertension: Secondary | ICD-10-CM | POA: Diagnosis not present

## 2014-05-03 DIAGNOSIS — E119 Type 2 diabetes mellitus without complications: Secondary | ICD-10-CM | POA: Diagnosis not present

## 2014-05-03 DIAGNOSIS — E669 Obesity, unspecified: Secondary | ICD-10-CM | POA: Diagnosis not present

## 2014-05-09 ENCOUNTER — Encounter: Payer: Self-pay | Admitting: Physician Assistant

## 2014-05-09 DIAGNOSIS — I10 Essential (primary) hypertension: Secondary | ICD-10-CM | POA: Diagnosis not present

## 2014-05-09 DIAGNOSIS — I509 Heart failure, unspecified: Secondary | ICD-10-CM | POA: Diagnosis not present

## 2014-05-09 DIAGNOSIS — E119 Type 2 diabetes mellitus without complications: Secondary | ICD-10-CM | POA: Diagnosis not present

## 2014-05-09 DIAGNOSIS — I2510 Atherosclerotic heart disease of native coronary artery without angina pectoris: Secondary | ICD-10-CM | POA: Diagnosis not present

## 2014-05-09 DIAGNOSIS — I5043 Acute on chronic combined systolic (congestive) and diastolic (congestive) heart failure: Secondary | ICD-10-CM | POA: Diagnosis not present

## 2014-05-09 DIAGNOSIS — E669 Obesity, unspecified: Secondary | ICD-10-CM | POA: Diagnosis not present

## 2014-05-15 DIAGNOSIS — E669 Obesity, unspecified: Secondary | ICD-10-CM | POA: Diagnosis not present

## 2014-05-15 DIAGNOSIS — E119 Type 2 diabetes mellitus without complications: Secondary | ICD-10-CM | POA: Diagnosis not present

## 2014-05-15 DIAGNOSIS — I2510 Atherosclerotic heart disease of native coronary artery without angina pectoris: Secondary | ICD-10-CM | POA: Diagnosis not present

## 2014-05-15 DIAGNOSIS — I10 Essential (primary) hypertension: Secondary | ICD-10-CM | POA: Diagnosis not present

## 2014-05-15 DIAGNOSIS — I5043 Acute on chronic combined systolic (congestive) and diastolic (congestive) heart failure: Secondary | ICD-10-CM | POA: Diagnosis not present

## 2014-05-15 DIAGNOSIS — I509 Heart failure, unspecified: Secondary | ICD-10-CM | POA: Diagnosis not present

## 2014-05-20 ENCOUNTER — Inpatient Hospital Stay (HOSPITAL_COMMUNITY)
Admission: RE | Admit: 2014-05-20 | Discharge: 2014-05-20 | Disposition: A | Discharge status: Home / Self Care | Payer: Medicare Other | Source: Ambulatory Visit

## 2014-05-21 DIAGNOSIS — I10 Essential (primary) hypertension: Secondary | ICD-10-CM | POA: Diagnosis not present

## 2014-05-21 DIAGNOSIS — I2510 Atherosclerotic heart disease of native coronary artery without angina pectoris: Secondary | ICD-10-CM | POA: Diagnosis not present

## 2014-05-21 DIAGNOSIS — E119 Type 2 diabetes mellitus without complications: Secondary | ICD-10-CM | POA: Diagnosis not present

## 2014-05-21 DIAGNOSIS — I5043 Acute on chronic combined systolic (congestive) and diastolic (congestive) heart failure: Secondary | ICD-10-CM | POA: Diagnosis not present

## 2014-05-21 DIAGNOSIS — E669 Obesity, unspecified: Secondary | ICD-10-CM | POA: Diagnosis not present

## 2014-05-21 DIAGNOSIS — I509 Heart failure, unspecified: Secondary | ICD-10-CM | POA: Diagnosis not present

## 2014-05-28 DIAGNOSIS — I2510 Atherosclerotic heart disease of native coronary artery without angina pectoris: Secondary | ICD-10-CM | POA: Diagnosis not present

## 2014-05-28 DIAGNOSIS — I5043 Acute on chronic combined systolic (congestive) and diastolic (congestive) heart failure: Secondary | ICD-10-CM | POA: Diagnosis not present

## 2014-05-28 DIAGNOSIS — E119 Type 2 diabetes mellitus without complications: Secondary | ICD-10-CM | POA: Diagnosis not present

## 2014-05-28 DIAGNOSIS — E669 Obesity, unspecified: Secondary | ICD-10-CM | POA: Diagnosis not present

## 2014-05-28 DIAGNOSIS — I10 Essential (primary) hypertension: Secondary | ICD-10-CM | POA: Diagnosis not present

## 2014-05-28 DIAGNOSIS — I509 Heart failure, unspecified: Secondary | ICD-10-CM | POA: Diagnosis not present

## 2014-05-28 NOTE — Progress Notes (Addendum)
Patient ID: [name], female   DOB: August 24, 1951, 63 y.o.   MRN: [id number] PCP: Dr. Luan Pulling Linna Hoff) Primary Cardiologist: Dr. [name]  HPI: Mrs. Falcon is a 63 yo female with a history of DM2, obesity, TTP, HTN, ICM, CAD and chronic systolic HF.   Admitted 7/4-7/12/15 for ARF and was intubated. ECHO showed depressed EF and was diuresed and extubated. Underwent R/LHC showing single vessel obstructive CAD with subtotal occlusion RCA @ crux and mildly elevated pressures. Discharge weight 212 lbs.   Follow up for Heart Failure: Last visit lisinopril was increased to 2.5 mg BID, however pharmacy refilled old bottle and did not put new orders on bottle. Doing well. Denies SOB, PND or CP. +orthopnea, but has always slept with a lot of pillows. Weight at home 200-202 lbs. No longer has HHRN. Following a low salt diet and drinking less than 2L a day. Walking daily with no issues. Able to go around the whole grocery store without stopping.   ROS: All systems negative except as listed in HPI, PMH and Problem List.  SH:  History   Social History  . Marital Status: Divorced    Spouse Name: N/A    Number of Children: 3  . Years of Education: N/A   Occupational History  . disabled    Social History Main Topics  . Smoking status: Former Smoker -- 0.25 packs/day for 10 years    Types: Cigarettes  . Smokeless tobacco: Not on file     Comment: Quit 2008  . Alcohol Use: No  . Drug Use: No  . Sexual Activity: Not on file   Other Topics Concern  . Not on file   Social History Narrative   Lives w/ daughter, [name]          FH:  Family History  Problem Relation Age of Onset  . Heart failure Mother   . Cirrhosis Father     ETOH  . Colon cancer Neg Hx   . Diabetes Daughter     Past Medical History  Diagnosis Date  . Diabetes mellitus   . Hypertension   . Arthritis   . TTP (thrombotic thrombocytopenic purpura)   . Erosive esophagitis   . Gastritis   . Anemia   .  Thyroid disease   . Duodenal ulcer     Age 58  . Obese   . Short-term memory loss   . Chronic systolic heart failure     a. ECHO (03/2014): EF 25-30%, akinesis enteroanteroseptal myocardium, grade III DD b. RHC (03/2014) RA 4, RV 42/4, PA 44/14 (26), PCWP 21, PA 62% Fick CO/CI 6.9 / 3.4  . Ischemic cardiomyopathy   . CAD (coronary artery disease)     a. LHC (03/2014) Lmain: nl, LAD: diff dz proximal 20%, 30-40% dz mid vessel prior 2nd diagonal, LCx: 40% in OM1, 20-30% in OM2, RCA: 99% subtotal occlusion @ crux, TIMI 1 flow, L-R collaterals to distal vessel    Current Outpatient Prescriptions  Medication Sig Dispense Refill  . acetaminophen (TYLENOL) 500 MG tablet Take 1,000 mg by mouth every 6 (six) hours as needed for moderate pain.       Marland Kitchen aspirin 81 MG chewable tablet Chew 1 tablet (81 mg total) by mouth daily.      Marland Kitchen atorvastatin (LIPITOR) 80 MG tablet Take 1 tablet (80 mg total) by mouth daily at 6 PM.  30 tablet  0  . carvedilol (COREG) 6.25 MG tablet Take 1 tablet (6.25 mg total)  by mouth 2 (two) times daily with a meal.  60 tablet  0  . furosemide (LASIX) 40 MG tablet Take 1 tablet (40 mg total) by mouth 2 (two) times daily.  60 tablet  3  . insulin glargine (LANTUS) 100 UNIT/ML injection Inject 35 Units into the skin 2 (two) times daily.      Marland Kitchen lisinopril (PRINIVIL,ZESTRIL) 2.5 MG tablet Take 2.5 mg by mouth daily.      . metoCLOPramide (REGLAN) 5 MG tablet Take 1 tablet (5 mg total) by mouth 4 (four) times daily -  before meals and at bedtime.  120 tablet  5  . NOVOLOG 100 UNIT/ML injection Inject 10 Units into the skin 3 (three) times daily with meals.       . polyethylene glycol powder (GLYCOLAX/MIRALAX) powder Take 17 g by mouth once. Each day in no more than 1/2 cup of liquid (4 ounces)  255 g  6  . spironolactone (ALDACTONE) 25 MG tablet Take 0.5 tablets (12.5 mg total) by mouth daily.  15 tablet  3   No current facility-administered medications for this encounter.    Filed  Vitals:   05/29/14 0928  BP: 118/72  Pulse: 85  Weight: 207 lb 3.2 oz (93.985 kg)  SpO2: 96%    PHYSICAL EXAM: General:  Well appearing. No resp difficulty, daughter present HEENT: normal Neck: supple. JVP flat. Carotids 2+ bilaterally; no bruits. No lymphadenopathy or thryomegaly appreciated. Cor: PMI normal. Regular rate & rhythm. No rubs, gallops or murmurs. Lungs: diminished in the bases. Abdomen: obese, soft, nontender, nondistended. No hepatosplenomegaly. No bruits or masses. Good bowel sounds. Extremities: no cyanosis, clubbing, rash, edema,, 2+ pedal pulses Neuro: alert & orientedx3, cranial nerves grossly intact. Moves all 4 extremities w/o difficulty. Affect pleasant.  ASSESSMENT & PLAN:  1) Chronic combined systolic/diastolic HF: ICM, EF 06-23% with grade III DD (03/2014) - NYHA II symptoms and volume status stable. Will continue lasix 40 mg BID. We discussed the use of sliding scale diuretics. - Will increase coreg to 6.25 mg in the morning and 9.375 mg in the evening. Discussed calling if any dizziness or fatigue. - Increase lisinopril to 2.5 mg BID. Will check BMET in 7-10 days and check BMET today. - Continue Spironolactone 12.5 mg daily.  - Will repeat ECHO next visit to assess EF. If remains less than 35% will need referral to EP for consideration of ICD preferably Medtronic d/t HF diagnostics.  - Plan after next visit if doing well to transition back to Dr. Harl Bowie.  - Reinforced the need and importance of daily weights, a low sodium diet, and fluid restriction (less than 2 L a day). Instructed to call the HF clinic if weight increases more than 3 lbs overnight or 5 lbs in a week.  2) HTN - stable. As above will increase lisinopril and BB.  3) CAD - LHC during hospitalization showing single vessel CAD with subtotal occlusion of RCA. Will continue statin, ACE-I, BB and ASA. Continue to follow with primary cardiologist. No s/s of ischemia.  4) Obesity - Discussed trying  to lose weight by being more active and watching portion sizes. Congratulated patient on exercising more. 5) ?OSA - Patient and daughter report she snores. Will refer for sleep study.   F/U 1 month with ECHO.  Junie Bame B NP-C 9:33 AM

## 2014-05-29 ENCOUNTER — Encounter (HOSPITAL_COMMUNITY): Payer: Self-pay

## 2014-05-29 ENCOUNTER — Ambulatory Visit (HOSPITAL_COMMUNITY)
Admission: RE | Admit: 2014-05-29 | Discharge: 2014-05-29 | Disposition: A | Discharge status: Home / Self Care | Payer: Medicare Other | Source: Ambulatory Visit | Attending: Cardiology | Admitting: Cardiology

## 2014-05-29 VITALS — BP 118/72 | HR 85 | Wt 207.2 lb

## 2014-05-29 DIAGNOSIS — I509 Heart failure, unspecified: Secondary | ICD-10-CM | POA: Insufficient documentation

## 2014-05-29 DIAGNOSIS — E669 Obesity, unspecified: Secondary | ICD-10-CM | POA: Diagnosis not present

## 2014-05-29 DIAGNOSIS — Z794 Long term (current) use of insulin: Secondary | ICD-10-CM | POA: Insufficient documentation

## 2014-05-29 DIAGNOSIS — R0683 Snoring: Secondary | ICD-10-CM

## 2014-05-29 DIAGNOSIS — I5042 Chronic combined systolic (congestive) and diastolic (congestive) heart failure: Secondary | ICD-10-CM | POA: Insufficient documentation

## 2014-05-29 DIAGNOSIS — Z7982 Long term (current) use of aspirin: Secondary | ICD-10-CM | POA: Diagnosis not present

## 2014-05-29 DIAGNOSIS — I2510 Atherosclerotic heart disease of native coronary artery without angina pectoris: Secondary | ICD-10-CM | POA: Diagnosis not present

## 2014-05-29 DIAGNOSIS — R0609 Other forms of dyspnea: Secondary | ICD-10-CM | POA: Insufficient documentation

## 2014-05-29 DIAGNOSIS — E119 Type 2 diabetes mellitus without complications: Secondary | ICD-10-CM | POA: Insufficient documentation

## 2014-05-29 DIAGNOSIS — R0989 Other specified symptoms and signs involving the circulatory and respiratory systems: Secondary | ICD-10-CM | POA: Diagnosis not present

## 2014-05-29 DIAGNOSIS — I10 Essential (primary) hypertension: Secondary | ICD-10-CM | POA: Diagnosis not present

## 2014-05-29 LAB — BASIC METABOLIC PANEL
Anion gap: 12 (ref 5–15)
BUN: 13 mg/dL (ref 6–23)
CHLORIDE: 101 meq/L (ref 96–112)
CO2: 23 mEq/L (ref 19–32)
Calcium: 9.1 mg/dL (ref 8.4–10.5)
Creatinine, Ser: 0.7 mg/dL (ref 0.50–1.10)
GFR calc Af Amer: >90 mL/min (ref >90)
GFR calc non Af Amer: 89 mL/min — ABNORMAL LOW (ref >90)
GLUCOSE: 140 mg/dL — AB (ref 70–99)
POTASSIUM: 4.3 meq/L (ref 3.7–5.3)
SODIUM: 136 meq/L — AB (ref 137–147)

## 2014-05-29 MED ORDER — LISINOPRIL 2.5 MG PO TABS
2.5000 mg | ORAL_TABLET | Freq: Two times a day (BID) | ORAL | Status: DC
Start: 2014-05-29 — End: 2016-03-04

## 2014-05-29 MED ORDER — CARVEDILOL 6.25 MG PO TABS: ORAL_TABLET | ORAL | Status: DC

## 2014-05-29 NOTE — Addendum Note (Signed)
Encounter addended by: Rande Brunt, NP on: 05/29/2014 12:10 PM<BR>     Documentation filed: Notes Section

## 2014-05-29 NOTE — Patient Instructions (Signed)
Doing great.  Will increase your lisinopril to 2.5 mg (1 tablet) in the morning and 2.5 mg (1 tablet) in the evening.  Increase your coreg 6.25 mg (1 tablet) in the morning and 9.375 mg (1 1/2 tablets) in the evening.  Will refer for sleep study.  Follow up in 1 month with ECHO.  Do the following things EVERYDAY: 1) Weigh yourself in the morning before breakfast. Write it down and keep it in a log. 2) Take your medicines as prescribed 3) Eat low salt foods-Limit salt (sodium) to 2000 mg per day.  4) Stay as active as you can everyday 5) Limit all fluids for the day to less than 2 liters 6)

## 2014-05-31 ENCOUNTER — Ambulatory Visit: Payer: Medicare Other | Admitting: Cardiology

## 2014-06-17 ENCOUNTER — Ambulatory Visit (INDEPENDENT_AMBULATORY_CARE_PROVIDER_SITE_OTHER): Payer: Medicare Other | Admitting: Cardiology

## 2014-06-17 ENCOUNTER — Encounter: Payer: Self-pay | Admitting: Cardiology

## 2014-06-17 VITALS — BP 118/62 | HR 84 | Ht 65.0 in | Wt 210.0 lb

## 2014-06-17 DIAGNOSIS — I5042 Chronic combined systolic (congestive) and diastolic (congestive) heart failure: Secondary | ICD-10-CM

## 2014-06-17 DIAGNOSIS — I2510 Atherosclerotic heart disease of native coronary artery without angina pectoris: Secondary | ICD-10-CM

## 2014-06-17 DIAGNOSIS — I509 Heart failure, unspecified: Secondary | ICD-10-CM

## 2014-06-17 DIAGNOSIS — I2589 Other forms of chronic ischemic heart disease: Secondary | ICD-10-CM

## 2014-06-17 NOTE — Progress Notes (Signed)
Clinical Summary [name] is a 63 y.o.female last seen by PA Lenze, this is our first visit together. She is seen for the following medical problems.  1. Chronic combined systolicand diastolic heart failure - echo 03/2014 LVEF 25-30%, restrictive diastolic dysfunction. New diagnosis at that time - cath 03/2014 showed RCA 99% with left to right collaterals, other arteries patent. Overall LV systolic dysfunction out of proportion to CAD.   - denies any SOB or DOE. No LE edema, no orthopnea - limiting salt intake. Avoiding NSAIDs.  - denies any lightheadedness or dizziness - she is also followed in [place], last seen 05/29/14. At that visit increased lisinopril to 2.5mg  bid, increased coreg to 6.25 in AM and 9.375mg  in PM. Since that change notes some mild fatigue.   2. CAD - cath 03/2014  with 99% chronic RCA disease, otherwise patent vessels. Managed medically  3. OSA?  + snoring, + witnessed apneic episodes. + daytime somnolence    Past Medical History  Diagnosis Date  . Diabetes mellitus   . Hypertension   . Arthritis   . TTP (thrombotic thrombocytopenic purpura)   . Erosive esophagitis   . Gastritis   . Anemia   . Thyroid disease   . Duodenal ulcer     Age 44  . Obese   . Short-term memory loss   . Chronic systolic heart failure     a. ECHO (03/2014): EF 25-30%, akinesis enteroanteroseptal myocardium, grade III DD b. RHC (03/2014) RA 4, RV 42/4, PA 44/14 (26), PCWP 21, PA 62% Fick CO/CI 6.9 / 3.4  . Ischemic cardiomyopathy   . CAD (coronary artery disease)     a. LHC (03/2014) Lmain: nl, LAD: diff dz proximal 20%, 30-40% dz mid vessel prior 2nd diagonal, LCx: 40% in OM1, 20-30% in OM2, RCA: 99% subtotal occlusion @ crux, TIMI 1 flow, L-R collaterals to distal vessel     Allergies  Allergen Reactions  . Other Other (See Comments)    FLAGYL, CIPRO, PROTONIX taken at the same time caused patient to lose her breath and have trouble breathing, requiring hospital stay at  [facility], 03/23/14-per patient.      Current Outpatient Prescriptions  Medication Sig Dispense Refill  . acetaminophen (TYLENOL) 500 MG tablet Take 1,000 mg by mouth every 6 (six) hours as needed for moderate pain.       Marland Kitchen aspirin 81 MG chewable tablet Chew 1 tablet (81 mg total) by mouth daily.      Marland Kitchen atorvastatin (LIPITOR) 80 MG tablet Take 1 tablet (80 mg total) by mouth daily at 6 PM.  30 tablet  0  . carvedilol (COREG) 6.25 MG tablet Take 6.25 mg (1 tablet) in the morning and 9.375 mg (1 1/2 tablets) in the evening.  75 tablet  3  . furosemide (LASIX) 40 MG tablet Take 1 tablet (40 mg total) by mouth 2 (two) times daily.  60 tablet  3  . insulin glargine (LANTUS) 100 UNIT/ML injection Inject 35 Units into the skin 2 (two) times daily.      Marland Kitchen lisinopril (PRINIVIL,ZESTRIL) 2.5 MG tablet Take 1 tablet (2.5 mg total) by mouth 2 (two) times daily.  60 tablet  3  . metoCLOPramide (REGLAN) 5 MG tablet Take 1 tablet (5 mg total) by mouth 4 (four) times daily -  before meals and at bedtime.  120 tablet  5  . NOVOLOG 100 UNIT/ML injection Inject 10 Units into the skin 3 (three) times daily with meals.       Marland Kitchen  polyethylene glycol powder (GLYCOLAX/MIRALAX) powder Take 17 g by mouth once. Each day in no more than 1/2 cup of liquid (4 ounces)  255 g  6  . spironolactone (ALDACTONE) 25 MG tablet Take 0.5 tablets (12.5 mg total) by mouth daily.  15 tablet  3   No current facility-administered medications for this visit.     Past Surgical History  Procedure Laterality Date  . Splenectomy, partial    . Ectopic pregnancy surgery    . Esophagogastroduodenoscopy  04/20/2012    BDZ:[id number] antral gastritis/Erosive reflux esophagitis  . Colonoscopy  08/29/2012    AST:[id number] diverticulosis     Allergies  Allergen Reactions  . Other Other (See Comments)    FLAGYL, CIPRO, PROTONIX taken at the same time caused patient to lose her breath and have trouble breathing, requiring hospital stay at [facility], 03/23/14-per  patient.       Family History  Problem Relation Age of Onset  . Heart failure Mother   . Cirrhosis Father     ETOH  . Colon cancer Neg Hx   . Diabetes Daughter      Social History [name] reports that she has quit smoking. Her smoking use included Cigarettes. She has a 2.5 pack-year smoking history. She does not have any smokeless tobacco history on file. [name] reports that she does not drink alcohol.   Review of Systems CONSTITUTIONAL: mild fatigue.  HEENT: Eyes: No visual loss, blurred vision, double vision or yellow sclerae.No hearing loss, sneezing, congestion, runny nose or sore throat.  SKIN: No rash or itching.  CARDIOVASCULAR: per HPI RESPIRATORY: No shortness of breath, cough or sputum.  GASTROINTESTINAL: No anorexia, nausea, vomiting or diarrhea. No abdominal pain or blood.  GENITOURINARY: No burning on urination, no polyuria NEUROLOGICAL: No headache, dizziness, syncope, paralysis, ataxia, numbness or tingling in the extremities. No change in bowel or bladder control.  MUSCULOSKELETAL: No muscle, back pain, joint pain or stiffness.  LYMPHATICS: No enlarged nodes. No history of splenectomy.  PSYCHIATRIC: No history of depression or anxiety.  ENDOCRINOLOGIC: No reports of sweating, cold or heat intolerance. No polyuria or polydipsia.  Marland Kitchen   Physical Examination p 84 bp 118/62 Wt 210 lbs BMI 35 Gen: resting comfortably, no acute distress HEENT: no scleral icterus, pupils equal round and reactive, no palptable cervical adenopathy,  CV: RRR, no m/r/g, no JVD, no carotid bruits Resp: Clear to auscultation bilaterally GI: abdomen is soft, non-tender, non-distended, normal bowel sounds, no hepatosplenomegaly MSK: extremities are warm, no edema.  Skin: warm, no rash Neuro:  no focal deficits Psych: appropriate affect   Diagnostic Studies 03/2014 echo Study Conclusions  - Left ventricle: The cavity size was normal. There was mild focal basal hypertrophy of  the septum. Systolic function was severely reduced. The estimated ejection fraction was in the range of 25% to 30%. There is akinesis of the entireanteroseptal myocardium. Doppler parameters are consistent with a reversible restrictive pattern, indicative of decreased left ventricular diastolic compliance and/or increased left atrial pressure (grade 3 diastolic dysfunction). - Mitral valve: There was mild regurgitation. - Left atrium: The atrium was moderately dilated.  03/2014 Cath Procedural Findings:  Hemodynamics  RA 8/0 mean 4 mm Hg  RV 42/4 mm Hg  PA 44/14 mean 26 mm Hg  PCWP 20/28 mean 21 mm Hg  LV 98/18 mm Hg  AO 95/54 mean 72 mm Hg  Oxygen saturations:  PA 62%  AO 90%  Cardiac Output (Fick) 6.9 L/min  Cardiac Index (Fick) 3.4 L/min/meter  squared  Coronary angiography:  Coronary dominance: right  Left mainstem: Normal  Left anterior descending (LAD): Diffuse disease in the proximal vessel up to 20%. 30-40% disease in the mid vessel prior to the second diagonal.  Left circumflex (LCx): 40% in OM1. 20-30% in OM2. Otherwise no significant disease.  Right coronary artery (RCA): 99% subtotal occlusion at the crux. TIMI 1 flow. Left to right collaterals to the distal vessel.  Left ventriculography: Left ventricular systolic function is abnormal, LVEF is estimated at 25%. There is inferior akinesis and severe global hypokinesis. There is mild mitral regurgitation  Final Conclusions:  1. Single vessel obstructive CAD with subtotal occlusion of the RCA at the crux.  2. Severe LV dysfunction.  3. Normal Right heart pressures. Elevated PCWP.  Recommendations: Medical management. Her degree of LV dysfunction is out of proportion to her CAD. She has no anginal symptoms so PCI of RCA not indicated. The inferior wall appears scarred.    Assessment and Plan  1. Chronic combined systolic/diastolic HF - NICM, LVEF 92-42%, NYHA II. She does not have ICD, she is undergoing trial of  medical therapy. Appears euvolemic - recent increase in coreg and lisinopril in CHF clinic, reports some mild fatigue. Will not make further changes today.   2. CAD - medically managed single vessel RCA disease, denies any symptoms - continue current meds  3. OSA? - several symptoms of OSA, will refer for sleep testing eval. Dr Luan Pulling is he primary, we have called and asked that he consider arranging   F/u early November   Arnoldo Lenis, M.D..

## 2014-06-17 NOTE — Patient Instructions (Signed)
Your physician recommends that you schedule a follow-up appointment in early November with Dr. Harl Bowie  Your physician recommends that you continue on your current medications as directed. Please refer to the Current Medication list given to you today.  You have been referred to Dr. Luan Pulling for sleep apnea. We will call his office today.  Thank you for choosing [facility]!!

## 2014-06-20 ENCOUNTER — Encounter: Payer: Self-pay | Admitting: Gastroenterology

## 2014-06-20 ENCOUNTER — Ambulatory Visit (INDEPENDENT_AMBULATORY_CARE_PROVIDER_SITE_OTHER): Payer: Medicare Other | Admitting: Gastroenterology

## 2014-06-20 VITALS — BP 121/77 | HR 79 | Temp 98.2°F | Ht 65.0 in | Wt 211.4 lb

## 2014-06-20 DIAGNOSIS — R945 Abnormal results of liver function studies: Secondary | ICD-10-CM | POA: Insufficient documentation

## 2014-06-20 DIAGNOSIS — I255 Ischemic cardiomyopathy: Secondary | ICD-10-CM

## 2014-06-20 DIAGNOSIS — K760 Fatty (change of) liver, not elsewhere classified: Secondary | ICD-10-CM | POA: Diagnosis not present

## 2014-06-20 DIAGNOSIS — R933 Abnormal findings on diagnostic imaging of other parts of digestive tract: Secondary | ICD-10-CM | POA: Diagnosis not present

## 2014-06-20 DIAGNOSIS — R7989 Other specified abnormal findings of blood chemistry: Secondary | ICD-10-CM | POA: Diagnosis not present

## 2014-06-20 DIAGNOSIS — K5909 Other constipation: Secondary | ICD-10-CM

## 2014-06-20 DIAGNOSIS — K5900 Constipation, unspecified: Secondary | ICD-10-CM

## 2014-06-20 DIAGNOSIS — K3184 Gastroparesis: Secondary | ICD-10-CM | POA: Diagnosis not present

## 2014-06-20 NOTE — Progress Notes (Signed)
cc'ed to pcp °

## 2014-06-20 NOTE — Patient Instructions (Signed)
1. Please take our lab order with you to your cardiology appointment to see if they will draw our labs at the same time as theirs.  2. I will discuss CT findings with Dr. Gala Romney and we will let you know if you will need a sigmoidoscopy or colonoscopy.

## 2014-06-20 NOTE — Assessment & Plan Note (Signed)
Recent noncontrast CT showed 1.6 cm density within the sigmoid colon, questionable stool versus polyp versus mass. Last colonoscopy in December 2013. Treated empirically for diverticulitis back in June. Clinically she is doing well. We'll discuss further with Dr. Gala Romney. May need sigmoidoscopy versus colonoscopy for evaluation.

## 2014-06-20 NOTE — Assessment & Plan Note (Signed)
Fatty liver. Recent bump in LFTs during hospitalization for vomiting. Abdominal ultrasound showed hepatic steatosis but no other issues. Recommend followup LFTs at this time

## 2014-06-20 NOTE — Progress Notes (Signed)
Primary Care Physician:  Alonza Bogus, MD  Primary Gastroenterologist:  Garfield Cornea, MD   Chief Complaint  Patient presents with  . Follow-up    HPI:  [name] is a 63 y.o. female here to determine whether she needs to have a colonoscopy or sigmoidoscopy for abnormal CT during hospitalization earlier this year. She was last seen in the office in June. At that time she was recovering from empirically treated acute diverticulitis. She has been hospitalized on a couple occasions since her last office visit. CT of the abdomen and pelvis without contrast July 2015 showed 1.5 cm rounded density seen within the lumen of the sigmoid colon, question stool versus mass versus polyp. She had bump in LFTs in July. Abd u/s showed ?hepatic steatosis.   BM are regular on Miralax. No N/V. No abdominal pain. No rectal bleeding or melena. Appetite fair. No taste to food. No heartburn.   Current Outpatient Prescriptions  Medication Sig Dispense Refill  . acetaminophen (TYLENOL) 500 MG tablet Take 1,000 mg by mouth every 6 (six) hours as needed for moderate pain.       Marland Kitchen aspirin 81 MG chewable tablet Chew 1 tablet (81 mg total) by mouth daily.      Marland Kitchen atorvastatin (LIPITOR) 80 MG tablet Take 1 tablet (80 mg total) by mouth daily at 6 PM.  30 tablet  0  . carvedilol (COREG) 6.25 MG tablet Take 6.25 mg (1 tablet) in the morning and 9.375 mg (1 1/2 tablets) in the evening.  75 tablet  3  . furosemide (LASIX) 40 MG tablet Take 1 tablet (40 mg total) by mouth 2 (two) times daily.  60 tablet  3  . insulin glargine (LANTUS) 100 UNIT/ML injection Inject 35 Units into the skin 2 (two) times daily.      Marland Kitchen lisinopril (PRINIVIL,ZESTRIL) 2.5 MG tablet Take 1 tablet (2.5 mg total) by mouth 2 (two) times daily.  60 tablet  3  . metoCLOPramide (REGLAN) 5 MG tablet Take 1 tablet (5 mg total) by mouth 4 (four) times daily -  before meals and at bedtime.  120 tablet  5  . NOVOLOG 100 UNIT/ML injection Inject 10 Units into  the skin 3 (three) times daily with meals.       . polyethylene glycol powder (GLYCOLAX/MIRALAX) powder Take 17 g by mouth once. Each day in no more than 1/2 cup of liquid (4 ounces)  255 g  6  . spironolactone (ALDACTONE) 25 MG tablet Take 0.5 tablets (12.5 mg total) by mouth daily.  15 tablet  3   No current facility-administered medications for this visit.    Allergies as of 06/20/2014 - Review Complete 06/20/2014  Allergen Reaction Noted  . Other Other (See Comments) 03/25/2014    Past Medical History  Diagnosis Date  . Diabetes mellitus   . Hypertension   . Arthritis   . TTP (thrombotic thrombocytopenic purpura)   . Erosive esophagitis   . Gastritis   . Anemia   . Thyroid disease   . Duodenal ulcer     Age 66  . Obese   . Short-term memory loss   . Chronic systolic heart failure     a. ECHO (03/2014): EF 25-30%, akinesis enteroanteroseptal myocardium, grade III DD b. RHC (03/2014) RA 4, RV 42/4, PA 44/14 (26), PCWP 21, PA 62% Fick CO/CI 6.9 / 3.4  . Ischemic cardiomyopathy   . CAD (coronary artery disease)     a. LHC (03/2014) Lmain: nl, LAD:  diff dz proximal 20%, 30-40% dz mid vessel prior 2nd diagonal, LCx: 40% in OM1, 20-30% in OM2, RCA: 99% subtotal occlusion @ crux, TIMI 1 flow, L-R collaterals to distal vessel    Past Surgical History  Procedure Laterality Date  . Splenectomy, partial    . Ectopic pregnancy surgery    . Esophagogastroduodenoscopy  04/20/2012    [id number]:[id number] antral gastritis/Erosive reflux esophagitis  . Colonoscopy  08/29/2012    [id number]:[id number] diverticulosis    Family History  Problem Relation Age of Onset  . Heart failure Mother   . Cirrhosis Father     ETOH  . Colon cancer Neg Hx   . Diabetes Daughter     History   Social History  . Marital Status: Divorced    Spouse Name: N/A    Number of Children: 3  . Years of Education: N/A   Occupational History  . disabled    Social History Main Topics  . Smoking status: Former Smoker -- 0.25  packs/day for 10 years    Types: Cigarettes  . Smokeless tobacco: Not on file     Comment: Quit 2008  . Alcohol Use: No  . Drug Use: No  . Sexual Activity: Not on file   Other Topics Concern  . Not on file   Social History Narrative   Lives w/ daughter, [name]            ROS:  General: Negative for anorexia, weight loss, fever, chills, fatigue, weakness. Complains of dizziness with new heart medications Eyes: Negative for vision changes.  ENT: Negative for hoarseness, difficulty swallowing , nasal congestion. CV: Negative for chest pain, angina, palpitations, dyspnea on exertion, peripheral edema.  Respiratory: Negative for dyspnea at rest, dyspnea on exertion, cough, sputum, wheezing.  GI: See history of present illness. GU:  Negative for dysuria, hematuria, urinary incontinence, urinary frequency, nocturnal urination.  MS: Negative for joint pain, low back pain.  Derm: Negative for rash or itching.  Neuro: Negative for weakness, abnormal sensation, seizure, frequent headaches, memory loss, confusion.  Psych: Negative for anxiety, depression, suicidal ideation, hallucinations.  Endo: Negative for unusual weight change.  Heme: Negative for bruising or bleeding. Allergy: Negative for rash or hives.    Physical Examination:  BP 121/77  Pulse 79  Temp(Src) 98.2 F (36.8 C) (Oral)  Ht 5\' 5"  (1.651 m)  Wt 211 lb 6.4 oz (95.89 kg)  BMI 35.18 kg/m2   General: Well-nourished, well-developed in no acute distress.  Head: Normocephalic, atraumatic.   Eyes: Conjunctiva pink, no icterus. Mouth: Oropharyngeal mucosa moist and pink , no lesions erythema or exudate. Neck: Supple without thyromegaly, masses, or lymphadenopathy.  Lungs: Clear to auscultation bilaterally.  Heart: Regular rate and rhythm, no murmurs rubs or gallops.  Abdomen: Bowel sounds are normal, nontender, nondistended, no hepatosplenomegaly or masses, no abdominal bruits or    hernia , no rebound or  guarding.   Rectal: Not performed Extremities: No lower extremity edema. No clubbing or deformities.  Neuro: Alert and oriented x 4 , grossly normal neurologically.  Skin: Warm and dry, no rash or jaundice.   Psych: Alert and cooperative, normal mood and affect.  Labs: Lab Results  Component Value Date   CREATININE 0.70 05/29/2014   BUN 13 05/29/2014   NA 136* 05/29/2014   K 4.3 05/29/2014   CL 101 05/29/2014   CO2 23 05/29/2014   Lab Results  Component Value Date   WBC 6.8 04/14/2014   HGB 11.8* 04/14/2014  HCT 37.1 04/14/2014   MCV 82.3 04/14/2014   PLT 302 04/14/2014   Lab Results  Component Value Date   ALT 109* 04/14/2014   AST 59* 04/14/2014   ALKPHOS 71 04/14/2014   BILITOT 0.6 04/14/2014   Lab Results  Component Value Date   IRON 26* 03/27/2014   TIBC 259 03/27/2014   FERRITIN 270 03/27/2014   Lab Results  Component Value Date   FERRITIN 270 03/27/2014     Imaging Studies: No results found.

## 2014-06-20 NOTE — Assessment & Plan Note (Signed)
Doing well on MiraLax daily.

## 2014-06-24 DIAGNOSIS — I2510 Atherosclerotic heart disease of native coronary artery without angina pectoris: Secondary | ICD-10-CM | POA: Diagnosis not present

## 2014-06-24 DIAGNOSIS — E1165 Type 2 diabetes mellitus with hyperglycemia: Secondary | ICD-10-CM | POA: Diagnosis not present

## 2014-06-24 DIAGNOSIS — Z23 Encounter for immunization: Secondary | ICD-10-CM | POA: Diagnosis not present

## 2014-06-24 DIAGNOSIS — I110 Hypertensive heart disease with heart failure: Secondary | ICD-10-CM | POA: Diagnosis not present

## 2014-06-24 DIAGNOSIS — D649 Anemia, unspecified: Secondary | ICD-10-CM | POA: Diagnosis not present

## 2014-06-28 ENCOUNTER — Encounter (HOSPITAL_COMMUNITY): Payer: Medicare Other

## 2014-06-28 ENCOUNTER — Ambulatory Visit (HOSPITAL_COMMUNITY): Payer: Medicare Other

## 2014-07-09 ENCOUNTER — Other Ambulatory Visit (HOSPITAL_COMMUNITY): Payer: Self-pay | Admitting: Respiratory Therapy

## 2014-07-09 DIAGNOSIS — G4730 Sleep apnea, unspecified: Secondary | ICD-10-CM

## 2014-07-10 ENCOUNTER — Ambulatory Visit (HOSPITAL_BASED_OUTPATIENT_CLINIC_OR_DEPARTMENT_OTHER)
Admission: RE | Admit: 2014-07-10 | Discharge: 2014-07-10 | Disposition: A | Discharge status: Home / Self Care | Payer: Medicare Other | Source: Ambulatory Visit | Attending: Cardiology | Admitting: Cardiology

## 2014-07-10 ENCOUNTER — Encounter (HOSPITAL_COMMUNITY): Payer: Self-pay

## 2014-07-10 ENCOUNTER — Telehealth (HOSPITAL_COMMUNITY): Payer: Self-pay | Admitting: Anesthesiology

## 2014-07-10 ENCOUNTER — Ambulatory Visit (HOSPITAL_COMMUNITY)
Admission: RE | Admit: 2014-07-10 | Discharge: 2014-07-10 | Disposition: A | Discharge status: Home / Self Care | Payer: Medicare Other | Source: Ambulatory Visit | Attending: Pulmonary Disease | Admitting: Pulmonary Disease

## 2014-07-10 VITALS — BP 123/79 | HR 85 | Resp 18 | Wt 212.5 lb

## 2014-07-10 DIAGNOSIS — I059 Rheumatic mitral valve disease, unspecified: Secondary | ICD-10-CM | POA: Diagnosis not present

## 2014-07-10 DIAGNOSIS — I255 Ischemic cardiomyopathy: Secondary | ICD-10-CM

## 2014-07-10 DIAGNOSIS — I5042 Chronic combined systolic (congestive) and diastolic (congestive) heart failure: Secondary | ICD-10-CM

## 2014-07-10 DIAGNOSIS — I10 Essential (primary) hypertension: Secondary | ICD-10-CM | POA: Diagnosis not present

## 2014-07-10 DIAGNOSIS — R0683 Snoring: Secondary | ICD-10-CM

## 2014-07-10 DIAGNOSIS — I2510 Atherosclerotic heart disease of native coronary artery without angina pectoris: Secondary | ICD-10-CM | POA: Diagnosis not present

## 2014-07-10 DIAGNOSIS — E119 Type 2 diabetes mellitus without complications: Secondary | ICD-10-CM | POA: Diagnosis not present

## 2014-07-10 DIAGNOSIS — I2583 Coronary atherosclerosis due to lipid rich plaque: Secondary | ICD-10-CM

## 2014-07-10 DIAGNOSIS — Z87891 Personal history of nicotine dependence: Secondary | ICD-10-CM | POA: Insufficient documentation

## 2014-07-10 DIAGNOSIS — I509 Heart failure, unspecified: Secondary | ICD-10-CM | POA: Diagnosis not present

## 2014-07-10 LAB — BASIC METABOLIC PANEL
Anion gap: 10 (ref 5–15)
BUN: 15 mg/dL (ref 6–23)
CO2: 28 mEq/L (ref 19–32)
Calcium: 9.3 mg/dL (ref 8.4–10.5)
Chloride: 102 mEq/L (ref 96–112)
Creatinine, Ser: 0.87 mg/dL (ref 0.50–1.10)
GFR calc Af Amer: 79 mL/min — ABNORMAL LOW (ref >90)
GFR calc non Af Amer: 68 mL/min — ABNORMAL LOW (ref >90)
Glucose, Bld: 129 mg/dL — ABNORMAL HIGH (ref 70–99)
POTASSIUM: 4.4 meq/L (ref 3.7–5.3)
SODIUM: 140 meq/L (ref 137–147)

## 2014-07-10 MED ORDER — SPIRONOLACTONE 25 MG PO TABS: 25.0000 mg | ORAL_TABLET | Freq: Every day | ORAL | Status: DC

## 2014-07-10 MED ORDER — CARVEDILOL 6.25 MG PO TABS: 9.3750 mg | ORAL_TABLET | Freq: Two times a day (BID) | ORAL | Status: DC

## 2014-07-10 NOTE — Progress Notes (Signed)
  Echocardiogram 2D Echocardiogram has been performed.  [name] FRANCES 07/10/2014, 9:04 AM

## 2014-07-10 NOTE — Telephone Encounter (Signed)
Reviewed patients ECHO and EF 40-45%. Called and reviewed results with patient, no need for referral for ICD.  Junie Bame B NP-C 3:47 PM

## 2014-07-10 NOTE — Patient Instructions (Signed)
Doing great.  Increase your Spironolactone to 25 mg (1 tablet) daily in the afternoon or in the evening.  Increase your coreg to 9.375 mg (1 1/2 tablets) in the morning and 9.375 mg (1 1/2 tablets) in the evening.   Call any dizziness.  Get labs checked in 1 week.   Follow up in 2 months  Do the following things EVERYDAY: 1) Weigh yourself in the morning before breakfast. Write it down and keep it in a log. 2) Take your medicines as prescribed 3) Eat low salt foods-Limit salt (sodium) to 2000 mg per day.  4) Stay as active as you can everyday 5) Limit all fluids for the day to less than 2 liters 6)

## 2014-07-10 NOTE — Progress Notes (Signed)
Patient ID: [name], female   DOB: Dec 16, 1950, 63 y.o.   MRN: [id number]  PCP: Dr. Luan Pulling Linna Hoff) Primary Cardiologist: Dr. [name]  HPI: [name] is a 63 yo female with a history of DM2, obesity, TTP, HTN, ICM, CAD and chronic systolic HF.   Admitted 7/4-7/12/15 for ARF and was intubated. ECHO showed depressed EF and was diuresed and extubated. Underwent R/LHC showing single vessel obstructive CAD with subtotal occlusion RCA @ crux and mildly elevated pressures. Discharge weight 212 lbs.   Follow up for Heart Failure: Last visit increased coreg to 6.25 mg q am and 9.375 mg q pm and lisinopril 2.5 mg BID which she tolerated. Doing well. Denies SOB, PND, orthopnea or CP. Feels fatigued sometimes like she wants to take a nap. Weight at home 200 lbs. Following a salt diet and drinking less than 2L a day. Able to go around the whole grocery store with no issues.    ROS: All systems negative except as listed in HPI, PMH and Problem List.  SH:  History   Social History  . Marital Status: Divorced    Spouse Name: N/A    Number of Children: 3  . Years of Education: N/A   Occupational History  . disabled    Social History Main Topics  . Smoking status: Former Smoker -- 0.25 packs/day for 10 years    Types: Cigarettes  . Smokeless tobacco: Not on file     Comment: Quit 2008  . Alcohol Use: No  . Drug Use: No  . Sexual Activity: Not on file   Other Topics Concern  . Not on file   Social History Narrative   Lives w/ daughter, [name]          FH:  Family History  Problem Relation Age of Onset  . Heart failure Mother   . Cirrhosis Father     ETOH  . Colon cancer Neg Hx   . Diabetes Daughter     Past Medical History  Diagnosis Date  . Diabetes mellitus   . Hypertension   . Arthritis   . TTP (thrombotic thrombocytopenic purpura)   . Erosive esophagitis   . Gastritis   . Anemia   . Thyroid disease   . Duodenal ulcer     Age 36  . Obese   .  Short-term memory loss   . Chronic systolic heart failure     a. ECHO (03/2014): EF 25-30%, akinesis enteroanteroseptal myocardium, grade III DD b. RHC (03/2014) RA 4, RV 42/4, PA 44/14 (26), PCWP 21, PA 62% Fick CO/CI 6.9 / 3.4  . Ischemic cardiomyopathy   . CAD (coronary artery disease)     a. LHC (03/2014) Lmain: nl, LAD: diff dz proximal 20%, 30-40% dz mid vessel prior 2nd diagonal, LCx: 40% in OM1, 20-30% in OM2, RCA: 99% subtotal occlusion @ crux, TIMI 1 flow, L-R collaterals to distal vessel    Current Outpatient Prescriptions  Medication Sig Dispense Refill  . acetaminophen (TYLENOL) 500 MG tablet Take 1,000 mg by mouth every 6 (six) hours as needed for moderate pain.       Marland Kitchen aspirin 81 MG chewable tablet Chew 1 tablet (81 mg total) by mouth daily.      Marland Kitchen atorvastatin (LIPITOR) 80 MG tablet Take 1 tablet (80 mg total) by mouth daily at 6 PM.  30 tablet  0  . carvedilol (COREG) 6.25 MG tablet Take 6.25 mg (1 tablet) in the morning and 9.375 mg (1  1/2 tablets) in the evening.  75 tablet  3  . furosemide (LASIX) 40 MG tablet Take 1 tablet (40 mg total) by mouth 2 (two) times daily.  60 tablet  3  . insulin glargine (LANTUS) 100 UNIT/ML injection Inject 35 Units into the skin 2 (two) times daily.      Marland Kitchen lisinopril (PRINIVIL,ZESTRIL) 2.5 MG tablet Take 1 tablet (2.5 mg total) by mouth 2 (two) times daily.  60 tablet  3  . metoCLOPramide (REGLAN) 5 MG tablet Take 1 tablet (5 mg total) by mouth 4 (four) times daily -  before meals and at bedtime.  120 tablet  5  . NOVOLOG 100 UNIT/ML injection Inject 10 Units into the skin 3 (three) times daily with meals.       . polyethylene glycol powder (GLYCOLAX/MIRALAX) powder Take 17 g by mouth once. Each day in no more than 1/2 cup of liquid (4 ounces)  255 g  6  . spironolactone (ALDACTONE) 25 MG tablet Take 0.5 tablets (12.5 mg total) by mouth daily.  15 tablet  3   No current facility-administered medications for this encounter.    Filed Vitals:    07/10/14 0906  BP: 123/79  Pulse: 85  Resp: 18  Weight: 212 lb 8 oz (96.389 kg)  SpO2: 99%    PHYSICAL EXAM: General:  Well appearing. No resp difficulty, daughter present HEENT: normal Neck: supple. JVP flat. Carotids 2+ bilaterally; no bruits. No lymphadenopathy or thryomegaly appreciated. Cor: PMI normal. Regular rate & rhythm. No rubs, gallops or murmurs. Lungs: diminished in the bases. Abdomen: obese, soft, nontender, nondistended. No hepatosplenomegaly. No bruits or masses. Good bowel sounds. Extremities: no cyanosis, clubbing, rash, edema,, 2+ pedal pulses Neuro: alert & orientedx3, cranial nerves grossly intact. Moves all 4 extremities w/o difficulty. Affect pleasant.  ASSESSMENT & PLAN:  1) Chronic combined systolic/diastolic HF: ICM, EF 11-91% with grade III DD (03/2014) - NYHA II symptoms and volume status stable. Will continue lasix 40 mg BID. We discussed the use of sliding scale diuretics. - Will increase coreg to 9.375 mg BID. Told to call if any dizziness.  - Increase Spironolactone to 25 mg daily. Check BMET today and in 7-10 days. - continue lisinopril 2.5 mg BID.  - Awaiting results of ECHO. If EF remains less than 35% will refer to EP for consideration of ICD. She does have a wide QRS 134 msec. May benefit from CRT-D device. Would prefer Medtronic ICD for the HF diagnostics.  - Reinforced the need and importance of daily weights, a low sodium diet, and fluid restriction (less than 2 L a day). Instructed to call the HF clinic if weight increases more than 3 lbs overnight or 5 lbs in a week.  2) HTN - stable. As above will increase spironolactone and BB for afterload reduction and LV dysfunction. 3) CAD - Last LHC hospitalization showed single vessel CAD with subtotal occlusion of RCA. Will continue statin, ACE-I, BB and ASA. Continue to follow with primary cardiologist. No s/s of ischemia.  4) Obesity - Discussed trying to lose weight by being more active and  watching portion sizes. 5) ?OSA - Patient and daughter report she snores. Will refer for sleep study.   F/U 2 months.  Junie Bame B NP-C 9:13 AM

## 2014-07-19 DIAGNOSIS — I5022 Chronic systolic (congestive) heart failure: Secondary | ICD-10-CM | POA: Diagnosis not present

## 2014-07-30 ENCOUNTER — Encounter: Payer: Medicare Other | Admitting: Cardiology

## 2014-07-30 NOTE — Progress Notes (Signed)
Clinical Summary [name] is a 63 y.o.female seen today for follow up of the following medical problems.   1. Chronic combined systolicand diastolic heart failure - echo 03/2014 LVEF 25-30%, restrictive diastolic dysfunction. New diagnosis at that time - cath 03/2014 showed RCA 99% with left to right collaterals, other arteries patent. Overall LV systolic dysfunction out of proportion to CAD.   - denies any SOB or DOE. No LE edema, no orthopnea - limiting salt intake. Avoiding NSAIDs.  - denies any lightheadedness or dizziness - she is also followed in [place], last seen 05/29/14. At that visit increased lisinopril to 2.5mg  bid, increased coreg to 6.25 in AM and 9.375mg  in PM. Since that change notes some mild fatigue.   2. CAD - cath 03/2014 with 99% chronic RCA disease, otherwise patent vessels. Managed medically  3. OSA?  + snoring, + witnessed apneic episodes. + daytime somnolence Past Medical History  Diagnosis Date  . Diabetes mellitus   . Hypertension   . Arthritis   . TTP (thrombotic thrombocytopenic purpura)   . Erosive esophagitis   . Gastritis   . Anemia   . Thyroid disease   . Duodenal ulcer     Age 25  . Obese   . Short-term memory loss   . Chronic systolic heart failure     a. ECHO (03/2014): EF 25-30%, akinesis enteroanteroseptal myocardium, grade III DD b. RHC (03/2014) RA 4, RV 42/4, PA 44/14 (26), PCWP 21, PA 62% Fick CO/CI 6.9 / 3.4  . Ischemic cardiomyopathy   . CAD (coronary artery disease)     a. LHC (03/2014) Lmain: nl, LAD: diff dz proximal 20%, 30-40% dz mid vessel prior 2nd diagonal, LCx: 40% in OM1, 20-30% in OM2, RCA: 99% subtotal occlusion @ crux, TIMI 1 flow, L-R collaterals to distal vessel     Allergies  Allergen Reactions  . Other Other (See Comments)    FLAGYL, CIPRO, PROTONIX taken at the same time caused patient to lose her breath and have trouble breathing, requiring hospital stay at [facility], 03/23/14-per patient.      Current  Outpatient Prescriptions  Medication Sig Dispense Refill  . acetaminophen (TYLENOL) 500 MG tablet Take 1,000 mg by mouth every 6 (six) hours as needed for moderate pain.     Marland Kitchen aspirin 81 MG chewable tablet Chew 1 tablet (81 mg total) by mouth daily.    Marland Kitchen atorvastatin (LIPITOR) 80 MG tablet Take 1 tablet (80 mg total) by mouth daily at 6 PM. 30 tablet 0  . carvedilol (COREG) 6.25 MG tablet Take 1.5 tablets (9.375 mg total) by mouth 2 (two) times daily with a meal. 270 tablet 3  . furosemide (LASIX) 40 MG tablet Take 1 tablet (40 mg total) by mouth 2 (two) times daily. 60 tablet 3  . insulin glargine (LANTUS) 100 UNIT/ML injection Inject 35 Units into the skin 2 (two) times daily.    Marland Kitchen lisinopril (PRINIVIL,ZESTRIL) 2.5 MG tablet Take 1 tablet (2.5 mg total) by mouth 2 (two) times daily. 60 tablet 3  . metoCLOPramide (REGLAN) 5 MG tablet Take 1 tablet (5 mg total) by mouth 4 (four) times daily -  before meals and at bedtime. 120 tablet 5  . NOVOLOG 100 UNIT/ML injection Inject 10 Units into the skin 3 (three) times daily with meals.     . polyethylene glycol powder (GLYCOLAX/MIRALAX) powder Take 17 g by mouth once. Each day in no more than 1/2 cup of liquid (4 ounces) 255 g  6  . spironolactone (ALDACTONE) 25 MG tablet Take 1 tablet (25 mg total) by mouth daily. 90 tablet 3   No current facility-administered medications for this visit.     Past Surgical History  Procedure Laterality Date  . Splenectomy, partial    . Ectopic pregnancy surgery    . Esophagogastroduodenoscopy  04/20/2012    UDJ:[id number] antral gastritis/Erosive reflux esophagitis  . Colonoscopy  08/29/2012    ZCH:[id number] diverticulosis     Allergies  Allergen Reactions  . Other Other (See Comments)    FLAGYL, CIPRO, PROTONIX taken at the same time caused patient to lose her breath and have trouble breathing, requiring hospital stay at [facility], 03/23/14-per patient.       Family History  Problem Relation Age of Onset  . Heart  failure Mother   . Cirrhosis Father     ETOH  . Colon cancer Neg Hx   . Diabetes Daughter      Social History Ms. Ehmann reports that she has quit smoking. Her smoking use included Cigarettes. She has a 2.5 pack-year smoking history. She does not have any smokeless tobacco history on file. Ms. Tirpak reports that she does not drink alcohol.   Review of Systems CONSTITUTIONAL: No weight loss, fever, chills, weakness or fatigue.  HEENT: Eyes: No visual loss, blurred vision, double vision or yellow sclerae.No hearing loss, sneezing, congestion, runny nose or sore throat.  SKIN: No rash or itching.  CARDIOVASCULAR:  RESPIRATORY: No shortness of breath, cough or sputum.  GASTROINTESTINAL: No anorexia, nausea, vomiting or diarrhea. No abdominal pain or blood.  GENITOURINARY: No burning on urination, no polyuria NEUROLOGICAL: No headache, dizziness, syncope, paralysis, ataxia, numbness or tingling in the extremities. No change in bowel or bladder control.  MUSCULOSKELETAL: No muscle, back pain, joint pain or stiffness.  LYMPHATICS: No enlarged nodes. No history of splenectomy.  PSYCHIATRIC: No history of depression or anxiety.  ENDOCRINOLOGIC: No reports of sweating, cold or heat intolerance. No polyuria or polydipsia.  Marland Kitchen   Physical Examination There were no vitals filed for this visit. There were no vitals filed for this visit.  Gen: resting comfortably, no acute distress HEENT: no scleral icterus, pupils equal round and reactive, no palptable cervical adenopathy,  CV Resp: Clear to auscultation bilaterally GI: abdomen is soft, non-tender, non-distended, normal bowel sounds, no hepatosplenomegaly MSK: extremities are warm, no edema.  Skin: warm, no rash Neuro:  no focal deficits Psych: appropriate affect   Diagnostic Studies 03/2014 echo Study Conclusions  - Left ventricle: The cavity size was normal. There was mild focal basal hypertrophy of the septum. Systolic function  was severely reduced. The estimated ejection fraction was in the range of 25% to 30%. There is akinesis of the entireanteroseptal myocardium. Doppler parameters are consistent with a reversible restrictive pattern, indicative of decreased left ventricular diastolic compliance and/or increased left atrial pressure (grade 3 diastolic dysfunction). - Mitral valve: There was mild regurgitation. - Left atrium: The atrium was moderately dilated.  03/2014 Cath Procedural Findings:  Hemodynamics  RA 8/0 mean 4 mm Hg  RV 42/4 mm Hg  PA 44/14 mean 26 mm Hg  PCWP 20/28 mean 21 mm Hg  LV 98/18 mm Hg  AO 95/54 mean 72 mm Hg  Oxygen saturations:  PA 62%  AO 90%  Cardiac Output (Fick) 6.9 L/min  Cardiac Index (Fick) 3.4 L/min/meter squared  Coronary angiography:  Coronary dominance: right  Left mainstem: Normal  Left anterior descending (LAD): Diffuse disease in the proximal vessel up  to 20%. 30-40% disease in the mid vessel prior to the second diagonal.  Left circumflex (LCx): 40% in OM1. 20-30% in OM2. Otherwise no significant disease.  Right coronary artery (RCA): 99% subtotal occlusion at the crux. TIMI 1 flow. Left to right collaterals to the distal vessel.  Left ventriculography: Left ventricular systolic function is abnormal, LVEF is estimated at 25%. There is inferior akinesis and severe global hypokinesis. There is mild mitral regurgitation  Final Conclusions:  1. Single vessel obstructive CAD with subtotal occlusion of the RCA at the crux.  2. Severe LV dysfunction.  3. Normal Right heart pressures. Elevated PCWP.  Recommendations: Medical management. Her degree of LV dysfunction is out of proportion to her CAD. She has no anginal symptoms so PCI of RCA not indicated. The inferior wall appears scarred.      Assessment and Plan  1. Chronic combined systolic/diastolic HF - NICM, LVEF 84-53%, NYHA II. She does not have ICD, she is undergoing trial of medical  therapy. Appears euvolemic - recent increase in coreg and lisinopril in CHF clinic, reports some mild fatigue. Will not make further changes today.   2. CAD - medically managed single vessel RCA disease, denies any symptoms - continue current meds  3. OSA? - several symptoms of OSA, will refer for sleep testing eval. Dr Luan Pulling is he primary, we have called and asked that he consider arranging       Arnoldo Lenis, M.D.

## 2014-08-08 ENCOUNTER — Ambulatory Visit: Payer: Medicare Other | Admitting: Gastroenterology

## 2014-08-19 ENCOUNTER — Other Ambulatory Visit (HOSPITAL_COMMUNITY): Payer: Self-pay | Admitting: Cardiology

## 2014-08-19 DIAGNOSIS — I5022 Chronic systolic (congestive) heart failure: Secondary | ICD-10-CM

## 2014-08-19 MED ORDER — FUROSEMIDE 40 MG PO TABS: 40.0000 mg | ORAL_TABLET | Freq: Two times a day (BID) | ORAL | Status: DC

## 2014-08-29 ENCOUNTER — Encounter (HOSPITAL_COMMUNITY): Payer: Self-pay | Admitting: Cardiology

## 2014-09-09 ENCOUNTER — Ambulatory Visit (HOSPITAL_COMMUNITY)
Admission: RE | Admit: 2014-09-09 | Discharge: 2014-09-09 | Disposition: A | Discharge status: Home / Self Care | Payer: Medicare Other | Source: Ambulatory Visit | Attending: Internal Medicine | Admitting: Internal Medicine

## 2014-09-09 ENCOUNTER — Encounter (HOSPITAL_COMMUNITY): Payer: Self-pay

## 2014-09-09 VITALS — BP 117/72 | HR 85 | Resp 18 | Wt 218.2 lb

## 2014-09-09 DIAGNOSIS — I2510 Atherosclerotic heart disease of native coronary artery without angina pectoris: Secondary | ICD-10-CM | POA: Diagnosis not present

## 2014-09-09 DIAGNOSIS — I10 Essential (primary) hypertension: Secondary | ICD-10-CM | POA: Insufficient documentation

## 2014-09-09 DIAGNOSIS — I5043 Acute on chronic combined systolic (congestive) and diastolic (congestive) heart failure: Secondary | ICD-10-CM | POA: Diagnosis not present

## 2014-09-09 DIAGNOSIS — I5042 Chronic combined systolic (congestive) and diastolic (congestive) heart failure: Secondary | ICD-10-CM | POA: Diagnosis not present

## 2014-09-09 DIAGNOSIS — R0683 Snoring: Secondary | ICD-10-CM

## 2014-09-09 LAB — BASIC METABOLIC PANEL
ANION GAP: 11 (ref 5–15)
BUN: 19 mg/dL (ref 6–23)
CALCIUM: 9.4 mg/dL (ref 8.4–10.5)
CO2: 26 mEq/L (ref 19–32)
CREATININE: 0.81 mg/dL (ref 0.50–1.10)
Chloride: 100 mEq/L (ref 96–112)
GFR calc non Af Amer: 75 mL/min — ABNORMAL LOW (ref >90)
GFR, EST AFRICAN AMERICAN: 86 mL/min — AB (ref >90)
Glucose, Bld: 137 mg/dL — ABNORMAL HIGH (ref 70–99)
Potassium: 4.8 mEq/L (ref 3.7–5.3)
SODIUM: 137 meq/L (ref 137–147)

## 2014-09-09 NOTE — Progress Notes (Signed)
Patient ID: [name], female   DOB: 23-Dec-1950, 63 y.o.   MRN: [id number]  PCP: Dr. Luan Pulling Linna Hoff) Primary Cardiologist: Dr. [name]  HPI: [name] is a 63 yo female with a history of DM2, obesity, TTP, HTN, ICM, CAD and chronic systolic HF.   Admitted 7/4-7/12/15 for ARF and was intubated. ECHO showed depressed EF and was diuresed and extubated. Underwent R/LHC showing single vessel obstructive CAD with subtotal occlusion RCA @ crux and mildly elevated pressures. Discharge weight 212 lbs.   Follow up for Heart Failure: Last visit increased coreg to 9.375 BID. Feeling well. Denies SOB, orthopnea, PND or CP. Occasional dizziness. Remains fatigued every once in awhile. Weight at home 201-203 lbs. Following a salt diet and drinking less than 2L a day. Able to go around the whole grocery store with no issues.    ROS: All systems negative except as listed in HPI, PMH and Problem List.  SH:  History   Social History  . Marital Status: Divorced    Spouse Name: N/A    Number of Children: 3  . Years of Education: N/A   Occupational History  . disabled    Social History Main Topics  . Smoking status: Former Smoker -- 0.25 packs/day for 10 years    Types: Cigarettes  . Smokeless tobacco: Not on file     Comment: Quit 2008  . Alcohol Use: No  . Drug Use: No  . Sexual Activity: Not on file   Other Topics Concern  . Not on file   Social History Narrative   Lives w/ daughter, [name]          FH:  Family History  Problem Relation Age of Onset  . Heart failure Mother   . Cirrhosis Father     ETOH  . Colon cancer Neg Hx   . Diabetes Daughter     Past Medical History  Diagnosis Date  . Diabetes mellitus   . Hypertension   . Arthritis   . TTP (thrombotic thrombocytopenic purpura)   . Erosive esophagitis   . Gastritis   . Anemia   . Thyroid disease   . Duodenal ulcer     Age 87  . Obese   . Short-term memory loss   . Chronic systolic heart  failure     a. ECHO (03/2014): EF 25-30%, akinesis enteroanteroseptal myocardium, grade III DD b. RHC (03/2014) RA 4, RV 42/4, PA 44/14 (26), PCWP 21, PA 62% Fick CO/CI 6.9 / 3.4  . Ischemic cardiomyopathy   . CAD (coronary artery disease)     a. LHC (03/2014) Lmain: nl, LAD: diff dz proximal 20%, 30-40% dz mid vessel prior 2nd diagonal, LCx: 40% in OM1, 20-30% in OM2, RCA: 99% subtotal occlusion @ crux, TIMI 1 flow, L-R collaterals to distal vessel    Current Outpatient Prescriptions  Medication Sig Dispense Refill  . acetaminophen (TYLENOL) 500 MG tablet Take 1,000 mg by mouth every 6 (six) hours as needed for moderate pain.     Marland Kitchen aspirin 81 MG chewable tablet Chew 1 tablet (81 mg total) by mouth daily.    Marland Kitchen atorvastatin (LIPITOR) 80 MG tablet Take 1 tablet (80 mg total) by mouth daily at 6 PM. 30 tablet 0  . carvedilol (COREG) 6.25 MG tablet Take 1.5 tablets (9.375 mg total) by mouth 2 (two) times daily with a meal. 270 tablet 3  . furosemide (LASIX) 40 MG tablet Take 1 tablet (40 mg total) by mouth 2 (two)  times daily. 60 tablet 3  . insulin glargine (LANTUS) 100 UNIT/ML injection Inject 35 Units into the skin 2 (two) times daily.    Marland Kitchen lisinopril (PRINIVIL,ZESTRIL) 2.5 MG tablet Take 1 tablet (2.5 mg total) by mouth 2 (two) times daily. 60 tablet 3  . metoCLOPramide (REGLAN) 5 MG tablet Take 1 tablet (5 mg total) by mouth 4 (four) times daily -  before meals and at bedtime. 120 tablet 5  . NOVOLOG 100 UNIT/ML injection Inject 10 Units into the skin 3 (three) times daily with meals.     . polyethylene glycol powder (GLYCOLAX/MIRALAX) powder Take 17 g by mouth once. Each day in no more than 1/2 cup of liquid (4 ounces) 255 g 6  . spironolactone (ALDACTONE) 25 MG tablet Take 1 tablet (25 mg total) by mouth daily. 90 tablet 3   No current facility-administered medications for this encounter.    Filed Vitals:   09/09/14 1437  BP: 117/72  Pulse: 85  Resp: 18  Weight: 218 lb 4 oz (98.998 kg)   SpO2: 99%    PHYSICAL EXAM: General:  Well appearing. No resp difficulty, daughter present HEENT: normal Neck: supple. JVP flat. Carotids 2+ bilaterally; no bruits. No lymphadenopathy or thryomegaly appreciated. Cor: PMI normal. Regular rate & rhythm. No rubs, gallops or murmurs. Lungs: diminished in the bases. Abdomen: obese, soft, nontender, nondistended. No hepatosplenomegaly. No bruits or masses. Good bowel sounds. Extremities: no cyanosis, clubbing, rash, edema,, 2+ pedal pulses Neuro: alert & orientedx3, cranial nerves grossly intact. Moves all 4 extremities w/o difficulty. Affect pleasant.  ASSESSMENT & PLAN:  1) Chronic combined systolic/diastolic HF: ICM, EF 32-54% with grade I DD (06/2014) - NYHA II symptoms and volume status stable. Will continue lasix 40 mg BID. We discussed the use of sliding scale diuretics. - Continue coreg, lisinopril and spironolactone at current dose. Will not titrate with continued fatigue and occassional dizziness.  - Check BMET today - Reinforced the need and importance of daily weights, a low sodium diet, and fluid restriction (less than 2 L a day). Instructed to call the HF clinic if weight increases more than 3 lbs overnight or 5 lbs in a week.  2) HTN - stable. 3) CAD - Last LHC hospitalization showed single vessel CAD with subtotal occlusion of RCA. Will continue statin, ACE-I, BB and ASA. Continue to follow with primary cardiologist. No s/s of ischemia.  4) ?OSA - Patient and daughter report she snores. Patient reports that Dr. Harl Bowie is referring her to PCP to get sleep study. Agree with being treated with CPAP if she meets criteria.   F/U PRN  Patient is followed closely by Dr. Harl Bowie and is doing well. Will send back to Dr. Harl Bowie to follow and if she or Dr. Harl Bowie feel as if she needs to come back to our clinic we would feel free to see her.  Junie Bame B NP-C 2:59 PM

## 2014-09-09 NOTE — Patient Instructions (Signed)
Doing great.  Have a wonderful Christmas and a Happy New Years.  Follow up as needed.  Call any issues.  Do the following things EVERYDAY: 1) Weigh yourself in the morning before breakfast. Write it down and keep it in a log. 2) Take your medicines as prescribed 3) Eat low salt foods-Limit salt (sodium) to 2000 mg per day.  4) Stay as active as you can everyday 5) Limit all fluids for the day to less than 2 liters 6)

## 2014-10-30 ENCOUNTER — Encounter: Payer: Self-pay | Admitting: Cardiology

## 2014-10-30 ENCOUNTER — Ambulatory Visit (INDEPENDENT_AMBULATORY_CARE_PROVIDER_SITE_OTHER): Payer: Medicare Other | Admitting: Cardiology

## 2014-10-30 VITALS — BP 118/78 | HR 89 | Ht 65.0 in | Wt 222.8 lb

## 2014-10-30 DIAGNOSIS — I2510 Atherosclerotic heart disease of native coronary artery without angina pectoris: Secondary | ICD-10-CM | POA: Diagnosis not present

## 2014-10-30 DIAGNOSIS — I5022 Chronic systolic (congestive) heart failure: Secondary | ICD-10-CM | POA: Diagnosis not present

## 2014-10-30 MED ORDER — CARVEDILOL 12.5 MG PO TABS: 12.5000 mg | ORAL_TABLET | Freq: Two times a day (BID) | ORAL | Status: DC

## 2014-10-30 MED ORDER — FUROSEMIDE 40 MG PO TABS: 40.0000 mg | ORAL_TABLET | Freq: Every day | ORAL | Status: DC

## 2014-10-30 NOTE — Progress Notes (Signed)
Clinical Summary [name] is a 64 y.o.female seen today for follow up of the following medical problems.   1. Chronic combined systolicand diastolic heart failure - echo 03/2014 LVEF 25-30%, restrictive diastolic dysfunction. New diagnosis at that time. Repeat echo 06/2014 LVEF 40-45% - cath 03/2014 showed RCA 99% with left to right collaterals, other arteries patent. Overall LV systolic dysfunction out of proportion to CAD.   - denies any SOB or DOE. No LE edema, no orthopnea - working to limit salt intake. Avoiding NSAIDs.  - occasional lightheadness/dizziness, typicall with standing.    2. CAD - cath 03/2014 with 99% chronic RCA disease, otherwise patent vessels. Managed medically - no chest pain  3. OSA screen + snoring, + witnessed apneic episodes. + daytime somnolence - seen by Dr Luan Pulling, she reports she was to have sleep study but has not been scheduled  Past Medical History  Diagnosis Date  . Diabetes mellitus   . Hypertension   . Arthritis   . TTP (thrombotic thrombocytopenic purpura)   . Erosive esophagitis   . Gastritis   . Anemia   . Thyroid disease   . Duodenal ulcer     Age 33  . Obese   . Short-term memory loss   . Chronic systolic heart failure     a. ECHO (03/2014): EF 25-30%, akinesis enteroanteroseptal myocardium, grade III DD b. RHC (03/2014) RA 4, RV 42/4, PA 44/14 (26), PCWP 21, PA 62% Fick CO/CI 6.9 / 3.4  . Ischemic cardiomyopathy   . CAD (coronary artery disease)     a. LHC (03/2014) Lmain: nl, LAD: diff dz proximal 20%, 30-40% dz mid vessel prior 2nd diagonal, LCx: 40% in OM1, 20-30% in OM2, RCA: 99% subtotal occlusion @ crux, TIMI 1 flow, L-R collaterals to distal vessel     Allergies  Allergen Reactions  . Other Other (See Comments)    FLAGYL, CIPRO, PROTONIX taken at the same time caused patient to lose her breath and have trouble breathing, requiring hospital stay at [facility], 03/23/14-per patient.      Current Outpatient Prescriptions    Medication Sig Dispense Refill  . acetaminophen (TYLENOL) 500 MG tablet Take 1,000 mg by mouth every 6 (six) hours as needed for moderate pain.     Marland Kitchen aspirin 81 MG chewable tablet Chew 1 tablet (81 mg total) by mouth daily.    Marland Kitchen atorvastatin (LIPITOR) 80 MG tablet Take 1 tablet (80 mg total) by mouth daily at 6 PM. 30 tablet 0  . carvedilol (COREG) 6.25 MG tablet Take 1.5 tablets (9.375 mg total) by mouth 2 (two) times daily with a meal. 270 tablet 3  . furosemide (LASIX) 40 MG tablet Take 1 tablet (40 mg total) by mouth 2 (two) times daily. 60 tablet 3  . insulin glargine (LANTUS) 100 UNIT/ML injection Inject 35 Units into the skin 2 (two) times daily.    Marland Kitchen lisinopril (PRINIVIL,ZESTRIL) 2.5 MG tablet Take 1 tablet (2.5 mg total) by mouth 2 (two) times daily. 60 tablet 3  . metoCLOPramide (REGLAN) 5 MG tablet Take 1 tablet (5 mg total) by mouth 4 (four) times daily -  before meals and at bedtime. 120 tablet 5  . NOVOLOG 100 UNIT/ML injection Inject 10 Units into the skin 3 (three) times daily with meals.     . polyethylene glycol powder (GLYCOLAX/MIRALAX) powder Take 17 g by mouth once. Each day in no more than 1/2 cup of liquid (4 ounces) 255 g 6  .  spironolactone (ALDACTONE) 25 MG tablet Take 1 tablet (25 mg total) by mouth daily. 90 tablet 3   No current facility-administered medications for this visit.     Past Surgical History  Procedure Laterality Date  . Splenectomy, partial    . Ectopic pregnancy surgery    . Esophagogastroduodenoscopy  04/20/2012    [id number] antral gastritis/Erosive reflux esophagitis  . Colonoscopy  08/29/2012    [id number] diverticulosis  . Left and right heart catheterization with coronary angiogram N/A 03/28/2014    Procedure: LEFT AND RIGHT HEART CATHETERIZATION WITH CORONARY ANGIOGRAM;  Surgeon: Peter M Martinique, MD;  Location: [facility] CATH LAB;  Service: Cardiovascular;  Laterality: N/A;     Allergies  Allergen Reactions  . Other Other (See Comments)     FLAGYL, CIPRO, PROTONIX taken at the same time caused patient to lose her breath and have trouble breathing, requiring hospital stay at [facility], 03/23/14-per patient.       Family History  Problem Relation Age of Onset  . Heart failure Mother   . Cirrhosis Father     ETOH  . Colon cancer Neg Hx   . Diabetes Daughter      Social History [name] reports that she has quit smoking. Her smoking use included Cigarettes. She has a 2.5 pack-year smoking history. She does not have any smokeless tobacco history on file. [name] reports that she does not drink alcohol.   Review of Systems CONSTITUTIONAL: No weight loss, fever, chills, weakness or fatigue.  HEENT: Eyes: No visual loss, blurred vision, double vision or yellow sclerae.No hearing loss, sneezing, congestion, runny nose or sore throat.  SKIN: No rash or itching.  CARDIOVASCULAR: per HPI RESPIRATORY: No shortness of breath, cough or sputum.  GASTROINTESTINAL: No anorexia, nausea, vomiting or diarrhea. No abdominal pain or blood.  GENITOURINARY: No burning on urination, no polyuria NEUROLOGICAL: No headache, dizziness, syncope, paralysis, ataxia, numbness or tingling in the extremities. No change in bowel or bladder control.  MUSCULOSKELETAL: No muscle, back pain, joint pain or stiffness.  LYMPHATICS: No enlarged nodes. No history of splenectomy.  PSYCHIATRIC: No history of depression or anxiety.  ENDOCRINOLOGIC: No reports of sweating, cold or heat intolerance. No polyuria or polydipsia.  Marland Kitchen   Physical Examination p 89 bp 118/78 Wt 222 lbs BMI 37 Gen: resting comfortably, no acute distress HEENT: no scleral icterus, pupils equal round and reactive, no palptable cervical adenopathy,  CV: RRR, no m/r/g, no JVD, no carotid bruits Resp: Clear to auscultation bilaterally GI: abdomen is soft, non-tender, non-distended, normal bowel sounds, no hepatosplenomegaly MSK: extremities are warm, no edema.  Skin: warm, no rash Neuro:  no  focal deficits Psych: appropriate affect   Diagnostic Studies 03/2014 echo Study Conclusions  - Left ventricle: The cavity size was normal. There was mild focal basal hypertrophy of the septum. Systolic function was severely reduced. The estimated ejection fraction was in the range of 25% to 30%. There is akinesis of the entireanteroseptal myocardium. Doppler parameters are consistent with a reversible restrictive pattern, indicative of decreased left ventricular diastolic compliance and/or increased left atrial pressure (grade 3 diastolic dysfunction). - Mitral valve: There was mild regurgitation. - Left atrium: The atrium was moderately dilated.  03/2014 Cath Procedural Findings:  Hemodynamics  RA 8/0 mean 4 mm Hg  RV 42/4 mm Hg  PA 44/14 mean 26 mm Hg  PCWP 20/28 mean 21 mm Hg  LV 98/18 mm Hg  AO 95/54 mean 72 mm Hg  Oxygen saturations:  PA 62%  AO 90%  Cardiac Output (Fick) 6.9 L/min  Cardiac Index (Fick) 3.4 L/min/meter squared  Coronary angiography:  Coronary dominance: right  Left mainstem: Normal  Left anterior descending (LAD): Diffuse disease in the proximal vessel up to 20%. 30-40% disease in the mid vessel prior to the second diagonal.  Left circumflex (LCx): 40% in OM1. 20-30% in OM2. Otherwise no significant disease.  Right coronary artery (RCA): 99% subtotal occlusion at the crux. TIMI 1 flow. Left to right collaterals to the distal vessel.  Left ventriculography: Left ventricular systolic function is abnormal, LVEF is estimated at 25%. There is inferior akinesis and severe global hypokinesis. There is mild mitral regurgitation  Final Conclusions:  1. Single vessel obstructive CAD with subtotal occlusion of the RCA at the crux.  2. Severe LV dysfunction.  3. Normal Right heart pressures. Elevated PCWP.  Recommendations: Medical management. Her degree of LV dysfunction is out of proportion to her CAD. She has no anginal symptoms so PCI  of RCA not indicated. The inferior wall appears scarred.    Assessment and Plan  1. Chronic combined systolic/diastolic HF - NICM, LVEF 44-62% no up to 40-45%, NYHA II. Appears euvolemic - will increase coreg to 12.5mg  bid. Decrease lasix to 40mg  daily due to occasional orthostatic symptoms. Counseled ok to take additional lasix as needed.   2. CAD - medically managed single vessel RCA disease, denies any symptoms - continue current meds  3. OSA screen - several symptoms of OSA, she is awaiting sleep study per Dr Luan Pulling      Arnoldo Lenis, M.D.

## 2014-10-30 NOTE — Patient Instructions (Signed)
Your physician recommends that you schedule a follow-up appointment in: 2 months    Your physician has recommended you make the following change in your medication:    INCREASE Coreg to 12.5 mg twice a day   DECREASE Lasix to 40 mg daily      Thank you for choosing [facility] !

## 2014-12-31 ENCOUNTER — Encounter: Payer: Self-pay | Admitting: Cardiology

## 2014-12-31 ENCOUNTER — Ambulatory Visit (INDEPENDENT_AMBULATORY_CARE_PROVIDER_SITE_OTHER): Payer: Medicare Other | Admitting: Cardiology

## 2014-12-31 VITALS — BP 116/72 | HR 83 | Ht 65.0 in | Wt 225.0 lb

## 2014-12-31 DIAGNOSIS — I5042 Chronic combined systolic (congestive) and diastolic (congestive) heart failure: Secondary | ICD-10-CM | POA: Diagnosis not present

## 2014-12-31 DIAGNOSIS — I2510 Atherosclerotic heart disease of native coronary artery without angina pectoris: Secondary | ICD-10-CM

## 2014-12-31 NOTE — Patient Instructions (Signed)
Your physician wants you to follow-up in: 6 months with Dr Bryna Colander will receive a reminder letter in the mail two months in advance. If you don't receive a letter, please call our office to schedule the follow-up appointment.  Your physician recommends that you continue on your current medications as directed. Please refer to the Current Medication list given to you today.    Take Zantac 150 mg twice a day over the counter    Thank you for choosing [facility] !

## 2014-12-31 NOTE — Progress Notes (Signed)
Clinical Summary Ms. Welker is a 64 y.o.female  1. Chronic combined systolicand diastolic heart failure - echo 03/2014 LVEF 25-30%, restrictive diastolic dysfunction. New diagnosis at that time. Repeat echo 06/2014 LVEF 40-45% - cath 03/2014 showed RCA 99% with left to right collaterals, other arteries patent. Overall LV systolic dysfunction out of proportion to CAD.  - working to limit salt intake. Avoiding NSAIDs. Marland Kitchen   - last visit increased coreg to 12.5mg  bid. Notes some mild fatigue since change but overall tolerable. Denies any SOB or DOE. No LE edema. Orthostatic symptoms improved since cutting down on her lasix last visit.    2. CAD - cath 03/2014 with 99% chronic RCA disease, otherwise patent vessels. Managed medically - no chest pain since last visit  3. OSA screen + snoring, + witnessed apneic episodes. + daytime somnolence - awaiting sleep study  Past Medical History  Diagnosis Date  . Diabetes mellitus   . Hypertension   . Arthritis   . TTP (thrombotic thrombocytopenic purpura)   . Erosive esophagitis   . Gastritis   . Anemia   . Thyroid disease   . Duodenal ulcer     Age 82  . Obese   . Short-term memory loss   . Chronic systolic heart failure     a. ECHO (03/2014): EF 25-30%, akinesis enteroanteroseptal myocardium, grade III DD b. RHC (03/2014) RA 4, RV 42/4, PA 44/14 (26), PCWP 21, PA 62% Fick CO/CI 6.9 / 3.4  . Ischemic cardiomyopathy   . CAD (coronary artery disease)     a. LHC (03/2014) Lmain: nl, LAD: diff dz proximal 20%, 30-40% dz mid vessel prior 2nd diagonal, LCx: 40% in OM1, 20-30% in OM2, RCA: 99% subtotal occlusion @ crux, TIMI 1 flow, L-R collaterals to distal vessel     Allergies  Allergen Reactions  . Other Other (See Comments)    FLAGYL, CIPRO, PROTONIX taken at the same time caused patient to lose her breath and have trouble breathing, requiring hospital stay at [facility], 03/23/14-per patient.      Current Outpatient Prescriptions    Medication Sig Dispense Refill  . acetaminophen (TYLENOL) 500 MG tablet Take 1,000 mg by mouth every 6 (six) hours as needed for moderate pain.     Marland Kitchen aspirin 81 MG chewable tablet Chew 1 tablet (81 mg total) by mouth daily.    Marland Kitchen atorvastatin (LIPITOR) 80 MG tablet Take 1 tablet (80 mg total) by mouth daily at 6 PM. 30 tablet 0  . carvedilol (COREG) 12.5 MG tablet Take 1 tablet (12.5 mg total) by mouth 2 (two) times daily with a meal. 180 tablet 3  . furosemide (LASIX) 40 MG tablet Take 1 tablet (40 mg total) by mouth daily. 90 tablet 3  . insulin glargine (LANTUS) 100 UNIT/ML injection Inject 35 Units into the skin 2 (two) times daily.    Marland Kitchen lisinopril (PRINIVIL,ZESTRIL) 2.5 MG tablet Take 1 tablet (2.5 mg total) by mouth 2 (two) times daily. 60 tablet 3  . metoCLOPramide (REGLAN) 5 MG tablet Take 1 tablet (5 mg total) by mouth 4 (four) times daily -  before meals and at bedtime. 120 tablet 5  . NOVOLOG 100 UNIT/ML injection Inject 10 Units into the skin 3 (three) times daily with meals.     . polyethylene glycol powder (GLYCOLAX/MIRALAX) powder Take 17 g by mouth once. Each day in no more than 1/2 cup of liquid (4 ounces) 255 g 6  . spironolactone (ALDACTONE) 25 MG tablet  Take 1 tablet (25 mg total) by mouth daily. 90 tablet 3   No current facility-administered medications for this visit.     Past Surgical History  Procedure Laterality Date  . Splenectomy, partial    . Ectopic pregnancy surgery    . Esophagogastroduodenoscopy  04/20/2012    [id number] antral gastritis/Erosive reflux esophagitis  . Colonoscopy  08/29/2012    [id number] diverticulosis  . Left and right heart catheterization with coronary angiogram N/A 03/28/2014    Procedure: LEFT AND RIGHT HEART CATHETERIZATION WITH CORONARY ANGIOGRAM;  Surgeon: Peter M Martinique, MD;  Location: [facility] CATH LAB;  Service: Cardiovascular;  Laterality: N/A;     Allergies  Allergen Reactions  . Other Other (See Comments)    FLAGYL, CIPRO,  PROTONIX taken at the same time caused patient to lose her breath and have trouble breathing, requiring hospital stay at [facility], 03/23/14-per patient.       Family History  Problem Relation Age of Onset  . Heart failure Mother   . Cirrhosis Father     ETOH  . Colon cancer Neg Hx   . Diabetes Daughter      Social History Ms. Worth reports that she quit smoking about 36 years ago. Her smoking use included Cigarettes. She started smoking about 48 years ago. She has a 2.5 pack-year smoking history. She does not have any smokeless tobacco history on file. Ms. Peeks reports that she does not drink alcohol.   Review of Systems CONSTITUTIONAL: No weight loss, fever, chills, weakness or fatigue.  HEENT: Eyes: No visual loss, blurred vision, double vision or yellow sclerae.No hearing loss, sneezing, congestion, runny nose or sore throat.  SKIN: No rash or itching.  CARDIOVASCULAR: per HPI RESPIRATORY: No shortness of breath, cough or sputum.  GASTROINTESTINAL: No anorexia, nausea, vomiting or diarrhea. No abdominal pain or blood.  GENITOURINARY: No burning on urination, no polyuria NEUROLOGICAL: No headache, dizziness, syncope, paralysis, ataxia, numbness or tingling in the extremities. No change in bowel or bladder control.  MUSCULOSKELETAL: No muscle, back pain, joint pain or stiffness.  LYMPHATICS: No enlarged nodes. No history of splenectomy.  PSYCHIATRIC: No history of depression or anxiety.  ENDOCRINOLOGIC: No reports of sweating, cold or heat intolerance. No polyuria or polydipsia.  Marland Kitchen   Physical Examination p 83 bp 116/72 Wt 225 lbs BMI 37 Gen: resting comfortably, no acute distress HEENT: no scleral icterus, pupils equal round and reactive, no palptable cervical adenopathy,  CV: RRR, no m/r/g, no JVD, no carotid bruits Resp: Clear to auscultation bilaterally GI: abdomen is soft, non-tender, non-distended, normal bowel sounds, no hepatosplenomegaly MSK: extremities are warm, no  edema.  Skin: warm, no rash Neuro:  no focal deficits Psych: appropriate affect   Diagnostic Studies  03/2014 echo Study Conclusions  - Left ventricle: The cavity size was normal. There was mild focal basal hypertrophy of the septum. Systolic function was severely reduced. The estimated ejection fraction was in the range of 25% to 30%. There is akinesis of the entireanteroseptal myocardium. Doppler parameters are consistent with a reversible restrictive pattern, indicative of decreased left ventricular diastolic compliance and/or increased left atrial pressure (grade 3 diastolic dysfunction). - Mitral valve: There was mild regurgitation. - Left atrium: The atrium was moderately dilated.  03/2014 Cath Procedural Findings:  Hemodynamics  RA 8/0 mean 4 mm Hg  RV 42/4 mm Hg  PA 44/14 mean 26 mm Hg  PCWP 20/28 mean 21 mm Hg  LV 98/18 mm Hg  AO 95/54 mean 72 mm Hg  Oxygen saturations:  PA 62%  AO 90%  Cardiac Output (Fick) 6.9 L/min  Cardiac Index (Fick) 3.4 L/min/meter squared  Coronary angiography:  Coronary dominance: right  Left mainstem: Normal  Left anterior descending (LAD): Diffuse disease in the proximal vessel up to 20%. 30-40% disease in the mid vessel prior to the second diagonal.  Left circumflex (LCx): 40% in OM1. 20-30% in OM2. Otherwise no significant disease.  Right coronary artery (RCA): 99% subtotal occlusion at the crux. TIMI 1 flow. Left to right collaterals to the distal vessel.  Left ventriculography: Left ventricular systolic function is abnormal, LVEF is estimated at 25%. There is inferior akinesis and severe global hypokinesis. There is mild mitral regurgitation  Final Conclusions:  1. Single vessel obstructive CAD with subtotal occlusion of the RCA at the crux.  2. Severe LV dysfunction.  3. Normal Right heart pressures. Elevated PCWP.  Recommendations: Medical management. Her degree of LV dysfunction is out of proportion to  her CAD. She has no anginal symptoms so PCI of RCA not indicated. The inferior wall appears scarred.    Assessment and Plan  1. Chronic combined systolic/diastolic HF - NICM, LVEF 33-29% now up to 40-45%, NYHA II. Appears euvolemic - mild tolerable fatigue and dizziness on current regimen, will not increase further today. Continue current meds  2. CAD - medically managed single vessel RCA disease, denies any symptoms - continue current meds  3. OSA screen - awaiting sleep study, has upcoming appoitnment with Dr Luan Pulling.    F/u 6 months   Arnoldo Lenis, M.D.

## 2015-01-01 ENCOUNTER — Ambulatory Visit: Payer: Self-pay | Admitting: *Deleted

## 2015-01-01 ENCOUNTER — Encounter: Payer: Self-pay | Admitting: *Deleted

## 2015-01-01 NOTE — Patient Outreach (Signed)
[name]   01/01/2015  [name] April 05, 1951 [id number]  [name] is an 64 y.o. female  Subjective:   Patient reports she saw cardiologist he made no changes to medications. He does want her to see primary care doctor to look at her blood sugars, they have been elevated even with special attention to her diet.  Patient reports the cardiologist told her that her fatigue is from the increase in her carvedilol medication and that it should get some better with time, this encouraged patient.  Patient does not have advanced directives, states her daughter assists her but is not HCPOA.  Patient wants to work on her blood sugars at this time for focus.  Patient continues to keep a blood sugar log.  Patient unable to weigh as her scale is broken, wt was 220 at doctor office. Daughter is planning to get scale for patient. Patient voices awareness of signs and symptoms of heart failure to monitor for since she is not able to weigh.   Objective:   BP 138/82 mmHg  Pulse 78  Resp 18  Wt 220 lb (99.791 kg)  SpO2 97%   Review of Systems  Constitutional: Negative.   HENT: Negative.   Eyes: Negative.   Respiratory: Negative.   Cardiovascular: Negative.   Gastrointestinal: Negative.   Genitourinary: Negative.   Musculoskeletal: Negative.   Skin: Negative.   Neurological: Negative.   Endo/Heme/Allergies: Negative.   Psychiatric/Behavioral: Negative.     Physical Exam  Constitutional: She is oriented to person, place, and time. She appears well-developed and well-nourished.  Neck: Normal range of motion.  Cardiovascular: Normal rate, regular rhythm and normal heart sounds.   Respiratory: Effort normal and breath sounds normal.  GI: Soft. Bowel sounds are normal.  Neurological: She is alert and oriented to person, place, and time.  Skin: Skin is warm.  Psychiatric: She has a normal mood and affect.    Current Medications:   Current Outpatient  Prescriptions  Medication Sig Dispense Refill  . acetaminophen (TYLENOL) 500 MG tablet Take 1,000 mg by mouth every 6 (six) hours as needed for moderate pain.     Marland Kitchen aspirin 81 MG chewable tablet Chew 1 tablet (81 mg total) by mouth daily.    Marland Kitchen atorvastatin (LIPITOR) 80 MG tablet Take 1 tablet (80 mg total) by mouth daily at 6 PM. 30 tablet 0  . carvedilol (COREG) 12.5 MG tablet Take 1 tablet (12.5 mg total) by mouth 2 (two) times daily with a meal. 180 tablet 3  . furosemide (LASIX) 40 MG tablet Take 1 tablet (40 mg total) by mouth daily. 90 tablet 3  . insulin glargine (LANTUS) 100 UNIT/ML injection Inject 35 Units into the skin 2 (two) times daily.    Marland Kitchen lisinopril (PRINIVIL,ZESTRIL) 2.5 MG tablet Take 1 tablet (2.5 mg total) by mouth 2 (two) times daily. 60 tablet 3  . metoCLOPramide (REGLAN) 5 MG tablet Take 1 tablet (5 mg total) by mouth 4 (four) times daily -  before meals and at bedtime. 120 tablet 5  . NOVOLOG 100 UNIT/ML injection Inject 10 Units into the skin 3 (three) times daily with meals.     . polyethylene glycol powder (GLYCOLAX/MIRALAX) powder Take 17 g by mouth once. Each day in no more than 1/2 cup of liquid (4 ounces) 255 g 6  . spironolactone (ALDACTONE) 25 MG tablet Take 1 tablet (25 mg total) by mouth daily. 90 tablet 3   No current facility-administered medications for  this visit.    Functional Status:   In your present state of health, do you have any difficulty performing the following activities: 01/01/2015 04/14/2014  Hearing? N N  Vision? N N  Difficulty concentrating or making decisions? N N  Walking or climbing stairs? N N  Dressing or bathing? N N  Doing errands, shopping? N N  Preparing Food and eating ? N -  Using the Toilet? N -  In the past six months, have you accidently leaked urine? N -  Do you have problems with loss of bowel control? N -  Managing your Medications? N -  Managing your Finances? N -  Housekeeping or managing your Housekeeping? Y -     Fall/Depression Screening:    PHQ 2/9 Scores 01/01/2015  PHQ - 2 Score 0    Assessment:   THN CM Care Plan        Appointment from 01/01/2015 in [place] Problem [facility]  elevated blood sugars aeb her blood sugar readings    Care Plan for Problem One  Active   Interventions for Problem One Long Term Goal  reviewed blood sugar log, encouraged patient to learn what her A1C number is and what the Doctor wants it be    THN Long Term Goal (31-90 days)  patient blood sugars readings will be lower   THN Long Term Goal Start Date  01/01/15   THN CM Short Term Goal #2 (0-30 days)  Patient will follow diabetic diet   THN CM Short Term Goal #2 Start Date  01/01/15     Call to Dr. Luan Pulling office, left voicemail for receptionist to make appointment for patient per cardiologist request for elevated blood sugars.   Encourage patient to get scale soon to monitor weights but to monitor for other signs and symptoms.   Plan:  Visit Jan 30, 2015 follow up on blood sugar Best, possible discharge. Send visit note to MD for review.  Royetta Crochet. Laymond Purser, RN, BSN, [place] [phone]

## 2015-01-01 NOTE — Addendum Note (Signed)
Addended by: Burgess Amor E on: 01/01/2015 11:29 AM   Modules accepted: Medications

## 2015-01-05 IMAGING — CR DG ABDOMEN ACUTE W/ 1V CHEST
3 series · 3 of 3 positions shown · non-contrast
Comparison: Chest radiograph March 26, 2014; CT abdomen and pelvis
March 26, 2014

CLINICAL DATA: Pain with nausea and vomiting

EXAM:
ACUTE ABDOMEN SERIES (ABDOMEN 2 VIEW & CHEST 1 VIEW)

[w chest pa]
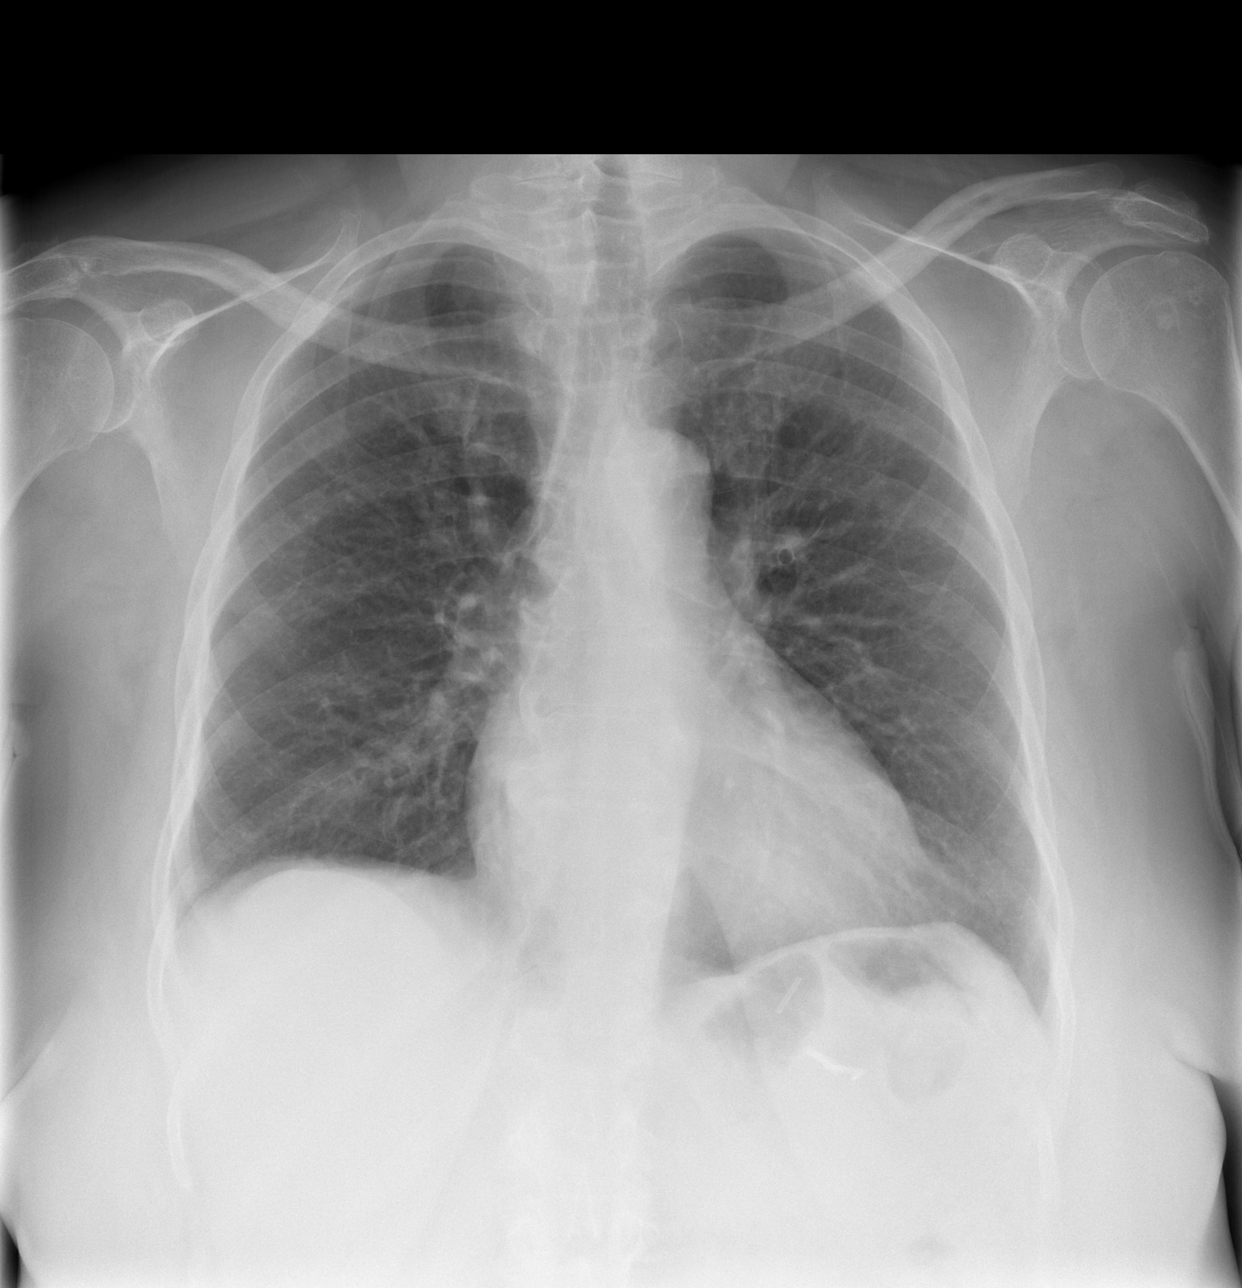

[w abdomen upright]
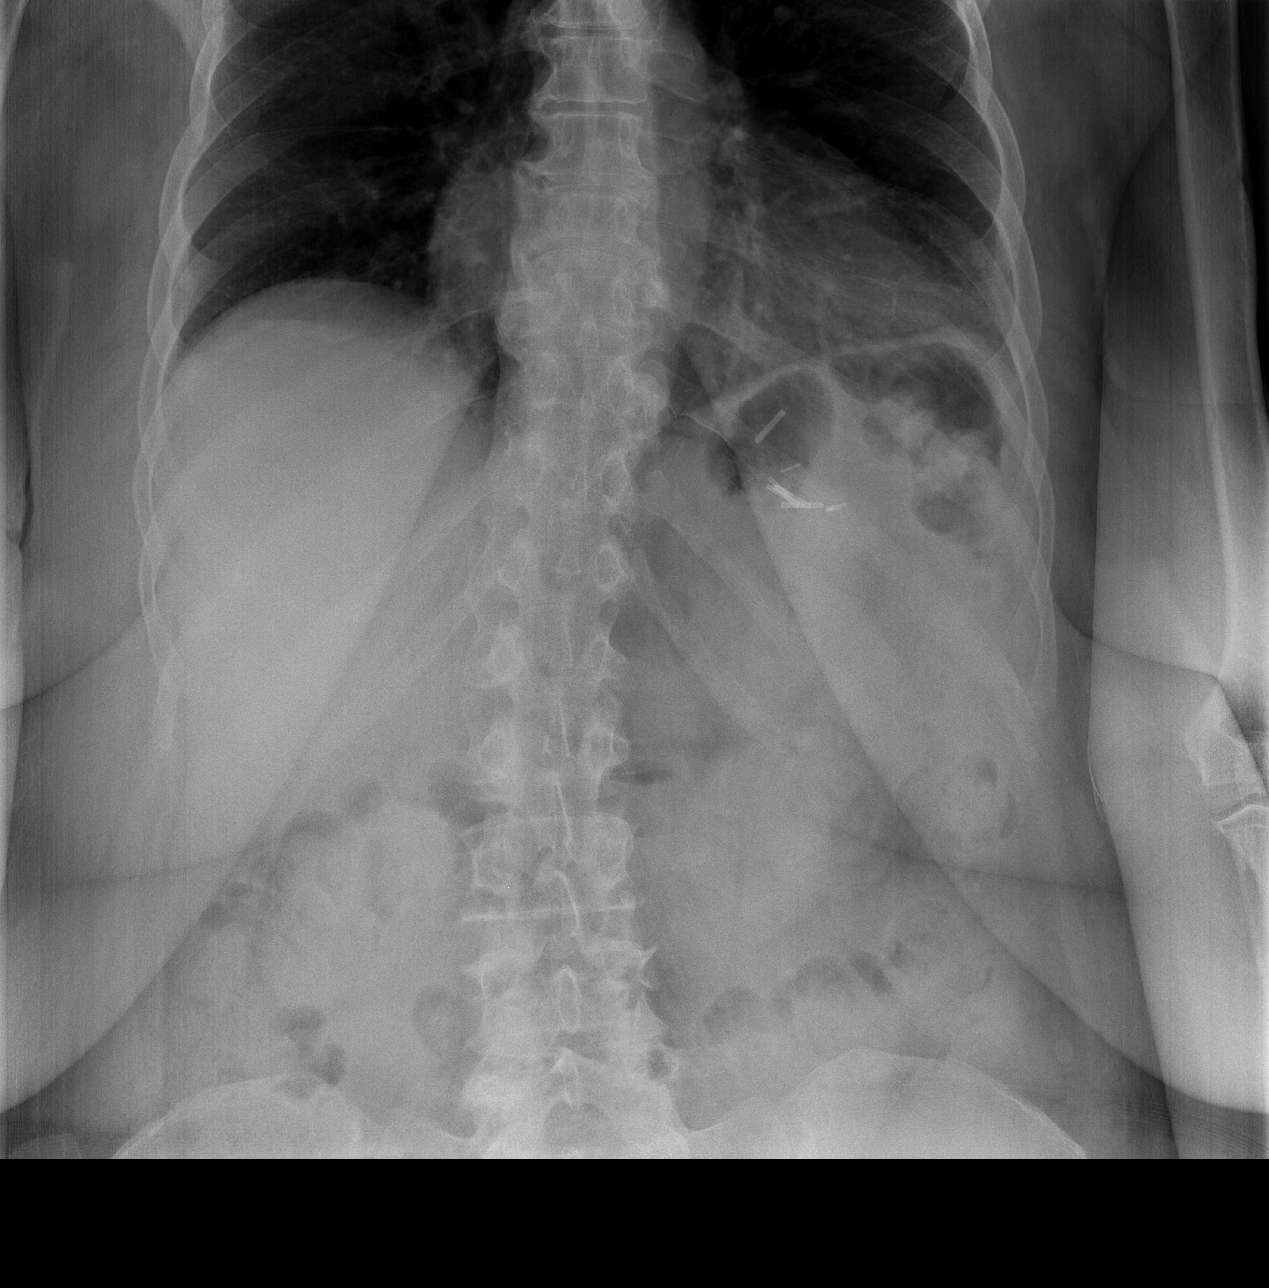

[t abdomen supine]
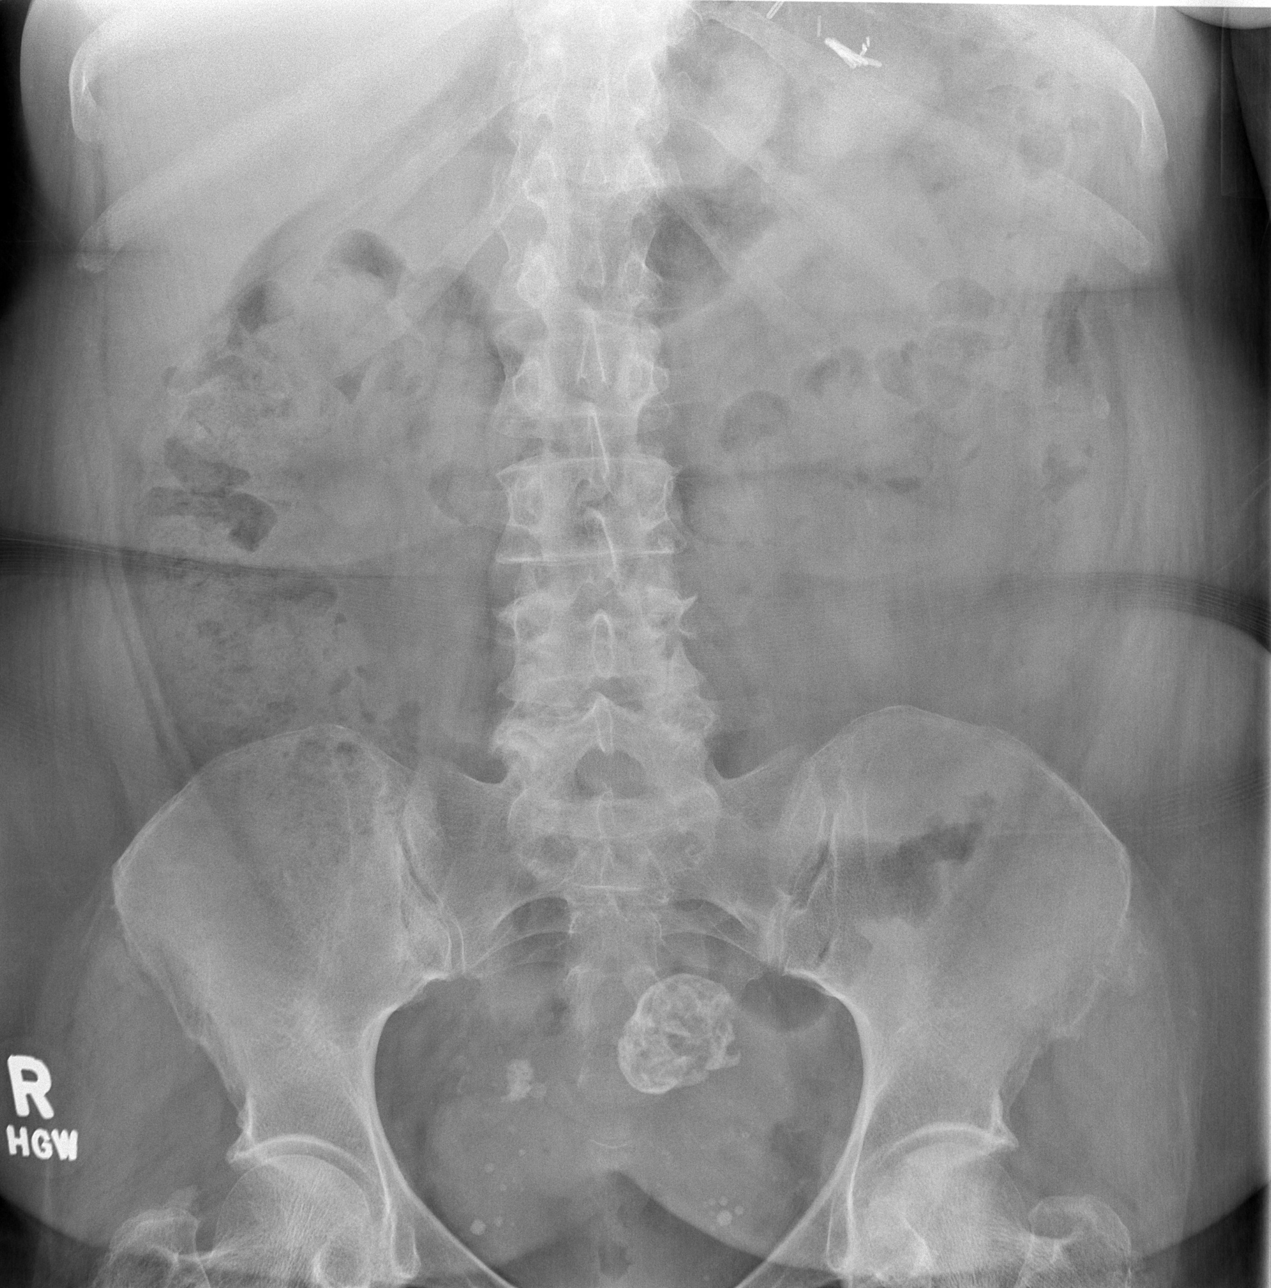

[3 of 3 positions shown; findings below may reference images not displayed]

FINDINGS: PA chest: There is no edema or consolidation. Heart size and
pulmonary vascularity are normal. No adenopathy.

Supine and upright abdomen: There are surgical clips in the left
upper quadrant. There is diffuse stool throughout colon. The bowel
gas pattern is unremarkable. No obstruction or free air.
Calcifications are present and pelvis consistent with uterine
leiomyomas. There also phleboliths in the pelvis.
IMPRESSION: There is fairly diffuse stool throughout colon. Overall bowel gas
pattern is unremarkable. No obstruction or free air.

There are calcified leiomyomas in the uterus.

There is no lung edema or consolidation.

## 2015-01-15 DIAGNOSIS — I10 Essential (primary) hypertension: Secondary | ICD-10-CM | POA: Diagnosis not present

## 2015-01-15 DIAGNOSIS — E1165 Type 2 diabetes mellitus with hyperglycemia: Secondary | ICD-10-CM | POA: Diagnosis not present

## 2015-01-15 DIAGNOSIS — E1143 Type 2 diabetes mellitus with diabetic autonomic (poly)neuropathy: Secondary | ICD-10-CM | POA: Diagnosis not present

## 2015-01-15 DIAGNOSIS — I110 Hypertensive heart disease with heart failure: Secondary | ICD-10-CM | POA: Diagnosis not present

## 2015-01-30 ENCOUNTER — Other Ambulatory Visit: Payer: Self-pay | Admitting: *Deleted

## 2015-01-30 VITALS — BP 114/68 | HR 80 | Resp 18 | Wt 219.0 lb

## 2015-01-30 DIAGNOSIS — E119 Type 2 diabetes mellitus without complications: Secondary | ICD-10-CM

## 2015-01-30 DIAGNOSIS — I509 Heart failure, unspecified: Secondary | ICD-10-CM

## 2015-01-30 NOTE — Patient Outreach (Signed)
[name] Care Management   01/30/2015  [name] 05-May-1951 [id number]  [name] is an 64 y.o. female  Subjective:  Patient reports she is doing pretty well. She saw Dr. Luan Pulling last week, he increased her Lantus to 40u BID and will plan for labwork in 3 months along with follow up appointment. She reports he did not want to check AIC at this time, due to medication changes but will check it before follow up appointment. Patient reportingher weight is stable today was 219 stays around 220, patient is weighing daily and keeping a log. Patient not keeping her food log, states she eats 3 small meals and only eats chicken or fish for proteins.  Patient agrees to transfer to telephonic health coach.  Objective:   Patient neatly groomed and dressed, ambulating without assistance.  Home clean.  23 year old grandson present for visit.  BP 114/68 mmHg  Pulse 80  Resp 18  Wt 219 lb (99.338 kg)  SpO2 98%   Review of Systems  Constitutional: Negative.   HENT: Negative.   Eyes: Negative.   Respiratory: Negative.   Gastrointestinal: Negative.   Musculoskeletal: Negative.   Skin: Negative.   Neurological: Negative.   Psychiatric/Behavioral: Negative.     Physical Exam  Constitutional: She is oriented to person, place, and time. She appears well-developed and well-nourished.  Neck: Normal range of motion.  Cardiovascular: Normal rate.   Respiratory: Effort normal and breath sounds normal.  GI: Soft.  Neurological: She is alert and oriented to person, place, and time.  Skin: Skin is warm.  Psychiatric: She has a normal mood and affect. Her behavior is normal. Judgment and thought content normal.    Current Medications:   Current Outpatient Prescriptions  Medication Sig Dispense Refill  . acetaminophen (TYLENOL) 500 MG tablet Take 1,000 mg by mouth every 6 (six) hours as needed for moderate pain.     Marland Kitchen aspirin 81 MG chewable tablet Chew 1 tablet (81 mg  total) by mouth daily.    Marland Kitchen atorvastatin (LIPITOR) 80 MG tablet Take 1 tablet (80 mg total) by mouth daily at 6 PM. 30 tablet 0  . carvedilol (COREG) 12.5 MG tablet Take 1 tablet (12.5 mg total) by mouth 2 (two) times daily with a meal. 180 tablet 3  . furosemide (LASIX) 40 MG tablet Take 1 tablet (40 mg total) by mouth daily. 90 tablet 3  . insulin glargine (LANTUS) 100 UNIT/ML injection Inject 35 Units into the skin 2 (two) times daily.    Marland Kitchen lisinopril (PRINIVIL,ZESTRIL) 2.5 MG tablet Take 1 tablet (2.5 mg total) by mouth 2 (two) times daily. 60 tablet 3  . metoCLOPramide (REGLAN) 5 MG tablet Take 1 tablet (5 mg total) by mouth 4 (four) times daily -  before meals and at bedtime. 120 tablet 5  . NOVOLOG 100 UNIT/ML injection Inject 10 Units into the skin 3 (three) times daily with meals.     . polyethylene glycol powder (GLYCOLAX/MIRALAX) powder Take 17 g by mouth once. Each day in no more than 1/2 cup of liquid (4 ounces) 255 g 6  . spironolactone (ALDACTONE) 25 MG tablet Take 1 tablet (25 mg total) by mouth daily. 90 tablet 3   No current facility-administered medications for this visit.    Functional Status:   In your present state of health, do you have any difficulty performing the following activities: 01/30/2015 01/01/2015  Hearing? N N  Vision? N N  Difficulty concentrating or making decisions?  N N  Walking or climbing stairs? N N  Dressing or bathing? N N  Doing errands, shopping? N N  Preparing Food and eating ? N N  Using the Toilet? N N  In the past six months, have you accidently leaked urine? - N  Do you have problems with loss of bowel control? N N  Managing your Medications? N N  Managing your Finances? N N  Housekeeping or managing your Housekeeping? Tempie Donning    Fall/Depression Screening:    PHQ 2/9 Scores 01/30/2015 01/01/2015  PHQ - 2 Score 0 0    Assessment:   Reviewed cbg log and machine Reviewed weights Reviewed medication changes Discussed transfer of case to  telephonic health coach for next month's visit [place] Problem One        Patient Outreach from 01/30/2015 in [place] Problem One  elevated blood sugars aeb her blood sugar readings    Care Plan for Problem One  Active   THN Long Term Goal (31-90 days)  patient blood sugars readings will be lower   THN Long Term Goal Start Date  01/01/15   Interventions for Problem One Long Term Goal  Using teachback method, reviewed blood sugar log and importance of lowering cbgs, goal to get to average to 160    THN CM Short Term Goal #2 (0-30 days)  Patient will follow diabetic diet   THN CM Short Term Goal #2 Start Date  01/01/15   Interventions for Short Term Goal #2  using teachback method, encouraged to keep food diary     Plan:   Transfer case to [place] program for diabetes and HF education and management. Royetta Crochet. Laymond Purser, RN, BSN, [place] [phone]

## 2015-01-31 ENCOUNTER — Other Ambulatory Visit: Payer: Self-pay

## 2015-01-31 NOTE — Patient Outreach (Signed)
[place]) Care Management  01/31/2015  [name] 1951-05-14 [id number]    Telephone call to patient to introduce health coach and role.  Patient receptive to the call.  Patient states she is doing well today.  She voices no complaints.    Plan: RN Health Coach will outreach to patient within one week and agrees to next outreach call.  Jone Baseman, RN, MSN [place] [phone]

## 2015-02-26 ENCOUNTER — Ambulatory Visit: Payer: Medicare Other

## 2015-02-26 ENCOUNTER — Other Ambulatory Visit: Payer: Self-pay

## 2015-02-26 NOTE — Patient Outreach (Signed)
[facility]) Care Management  02/26/2015  [name] 07/22/1951 [id number]  Telephone call to patient for monthly outreach.  Called home phone and it states number is disconnected.  Called mobile number and female answers stating wrong number.    Plan: Will attempt patient within the next week.  Jone Baseman, RN, MSN [place] [phone]

## 2015-03-04 ENCOUNTER — Ambulatory Visit: Payer: Medicare Other

## 2015-03-04 ENCOUNTER — Other Ambulatory Visit: Payer: Self-pay

## 2015-03-04 NOTE — Patient Outreach (Signed)
[place] [facility]) Care Management  03/04/2015  [name] May 06, 1951 [id number]   Telephone call to patient for second attempt for monthly outreach. Called home phone and it states number is disconnected. Called to daughter [name] at [phone] message left.     Plan: Will attempt patient within the next week.  Jone Baseman, RN, MSN [place] [phone]

## 2015-03-14 ENCOUNTER — Telehealth: Payer: Self-pay | Admitting: Gastroenterology

## 2015-03-14 NOTE — Telephone Encounter (Signed)
Please schedule OV to follow up. (labs were never done so we have loose ends).  She is overdue for LFTs. Also needs colonoscopy for abnormal CT last year.

## 2015-03-19 NOTE — Telephone Encounter (Signed)
Please schedule ov.  

## 2015-03-19 NOTE — Telephone Encounter (Signed)
Pt is scheduled 04/14/15 at 10:00 with Magda Paganini. Appt letter mailed

## 2015-03-27 ENCOUNTER — Other Ambulatory Visit: Payer: Self-pay

## 2015-03-27 NOTE — Patient Outreach (Signed)
[facility]) Care Management  03/27/2015  Erynne Kealey [birth year] [id number]   Third telephone call to patient for disease management outreach.  Received fast busy signal.     Plan: RNCM will send outreach letter to attempt contact.    Jone Baseman, RN, MSN [place] [phone]

## 2015-04-11 NOTE — Patient Outreach (Addendum)
[name] [facility]) Care Management  04/11/2015  [name] 1951-07-20 [id number]   No response from patient after 3 outreach calls and letter.  Plan: RNCM will forward patient information to Lurline Del for case closure.   RNCM will send letter to primary care physician notifying of case closure.  Jone Baseman, RN, MSN [place] [phone]

## 2015-04-14 ENCOUNTER — Ambulatory Visit: Payer: Self-pay | Admitting: Gastroenterology

## 2015-04-16 NOTE — Patient Outreach (Signed)
[place] [facility]) Care Management  04/16/2015  [name] 11/29/1950 [id number]   Notification from Jon Billings, RN to close case due to unable to contact patient for [place] Management services.  [name]. [place], [place] Management [place] Assistant Phone: [phone] Fax: [phone]

## 2015-05-05 ENCOUNTER — Encounter: Payer: Self-pay | Admitting: Gastroenterology

## 2015-05-05 ENCOUNTER — Ambulatory Visit (INDEPENDENT_AMBULATORY_CARE_PROVIDER_SITE_OTHER): Payer: Medicare Other | Admitting: Gastroenterology

## 2015-05-05 VITALS — BP 116/78 | HR 90 | Temp 97.9°F | Ht 65.0 in | Wt 221.4 lb

## 2015-05-05 DIAGNOSIS — I2510 Atherosclerotic heart disease of native coronary artery without angina pectoris: Secondary | ICD-10-CM | POA: Diagnosis not present

## 2015-05-05 DIAGNOSIS — D649 Anemia, unspecified: Secondary | ICD-10-CM | POA: Diagnosis not present

## 2015-05-05 DIAGNOSIS — R933 Abnormal findings on diagnostic imaging of other parts of digestive tract: Secondary | ICD-10-CM

## 2015-05-05 DIAGNOSIS — R945 Abnormal results of liver function studies: Secondary | ICD-10-CM

## 2015-05-05 DIAGNOSIS — R7989 Other specified abnormal findings of blood chemistry: Secondary | ICD-10-CM

## 2015-05-05 NOTE — Patient Instructions (Signed)
1. Have our labs drawn when you get Dr. Luan Pulling labs done.  2. I will address possibility of colonoscopy with Dr. Gala Romney and let you know his recommendations.

## 2015-05-05 NOTE — Progress Notes (Signed)
Primary Care Physician:  Alonza Bogus, MD  Primary Gastroenterologist:  Garfield Cornea, MD   Chief Complaint  Patient presents with  . Follow-up    HPI:  [name] is a 64 y.o. female here for follow up. Last seen in 06/2014 for abnormal CT and to determine whether she needs to have a colonoscopy or sigmoidoscopy. CT of the abdomen and pelvis without contrast July 2015 showed 1.5 cm rounded density seen within the lumen of the sigmoid colon, question stool versus mass versus polyp. She had bump in LFTs in July. Abd u/s showed ?hepatic steatosis. Last TCS 08/2012.   Currently patient is feeling fairly well. BM are regular on Miralax. No N/V. No abdominal pain. No rectal bleeding or melena. Appetite fair. No taste to food. No heartburn. No unintentional weight loss.    Current Outpatient Prescriptions  Medication Sig Dispense Refill  . acetaminophen (TYLENOL) 500 MG tablet Take 1,000 mg by mouth every 6 (six) hours as needed for moderate pain.     Marland Kitchen aspirin 81 MG chewable tablet Chew 1 tablet (81 mg total) by mouth daily.    Marland Kitchen atorvastatin (LIPITOR) 80 MG tablet Take 1 tablet (80 mg total) by mouth daily at 6 PM. 30 tablet 0  . carvedilol (COREG) 12.5 MG tablet Take 1 tablet (12.5 mg total) by mouth 2 (two) times daily with a meal. 180 tablet 3  . furosemide (LASIX) 40 MG tablet Take 1 tablet (40 mg total) by mouth daily. 90 tablet 3  . insulin glargine (LANTUS) 100 UNIT/ML injection Inject 40 Units into the skin 2 (two) times daily.    Marland Kitchen lisinopril (PRINIVIL,ZESTRIL) 2.5 MG tablet Take 1 tablet (2.5 mg total) by mouth 2 (two) times daily. 60 tablet 3  . metoCLOPramide (REGLAN) 5 MG tablet Take 1 tablet (5 mg total) by mouth 4 (four) times daily -  before meals and at bedtime. 120 tablet 5  . NOVOLOG 100 UNIT/ML injection Inject 10 Units into the skin 3 (three) times daily with meals.     . polyethylene glycol powder (GLYCOLAX/MIRALAX) powder Take 17 g by mouth once. Each day in no  more than 1/2 cup of liquid (4 ounces) 255 g 6  . spironolactone (ALDACTONE) 25 MG tablet Take 1 tablet (25 mg total) by mouth daily. 90 tablet 3   No current facility-administered medications for this visit.    Allergies as of 05/05/2015 - Review Complete 05/05/2015  Allergen Reaction Noted  . Other Other (See Comments) 03/25/2014    Past Medical History  Diagnosis Date  . Diabetes mellitus   . Hypertension   . Arthritis   . TTP (thrombotic thrombocytopenic purpura)   . Erosive esophagitis   . Gastritis   . Anemia   . Thyroid disease   . Duodenal ulcer     Age 45  . Obese   . Short-term memory loss   . Chronic systolic heart failure     a. ECHO (03/2014): EF 25-30%, akinesis enteroanteroseptal myocardium, grade III DD b. RHC (03/2014) RA 4, RV 42/4, PA 44/14 (26), PCWP 21, PA 62% Fick CO/CI 6.9 / 3.4  . Ischemic cardiomyopathy   . CAD (coronary artery disease)     a. LHC (03/2014) Lmain: nl, LAD: diff dz proximal 20%, 30-40% dz mid vessel prior 2nd diagonal, LCx: 40% in OM1, 20-30% in OM2, RCA: 99% subtotal occlusion @ crux, TIMI 1 flow, L-R collaterals to distal vessel    Past Surgical History  Procedure Laterality Date  .  Splenectomy, partial    . Ectopic pregnancy surgery    . Esophagogastroduodenoscopy  04/20/2012    QQI:WLNLGXQ antral gastritis/Erosive reflux esophagitis  . Colonoscopy  08/29/2012    JJH:ERDEYCX diverticulosis  . Left and right heart catheterization with coronary angiogram N/A 03/28/2014    Procedure: LEFT AND RIGHT HEART CATHETERIZATION WITH CORONARY ANGIOGRAM;  Surgeon: Peter M Martinique, MD;  Location[facility] CATH LAB;  Service: Cardiovascular;  Laterality: N/A;    Family History  Problem Relation Age of Onset  . Heart failure Mother   . Cirrhosis Father     ETOH  . Colon cancer Neg Hx   . Diabetes Daughter     Social History   Social History  . Marital Status: Divorced    Spouse Name: N/A  . Number of Children: 3  . Years of Education: N/A    Occupational History  . disabled    Social History Main Topics  . Smoking status: Former Smoker -- 0.25 packs/day for 10 years    Types: Cigarettes    Start date: 09/20/1966    Quit date: 09/20/1978  . Smokeless tobacco: Not on file     Comment: Quit 2008  . Alcohol Use: No  . Drug Use: No  . Sexual Activity: Not on file   Other Topics Concern  . Not on file   Social History Narrative   Lives w/ daughter, [name]            ROS:  General: Negative for anorexia, weight loss, fever, chills, fatigue, weakness. Eyes: Negative for vision changes.  ENT: Negative for hoarseness, difficulty swallowing , nasal congestion. CV: Negative for chest pain, angina, palpitations, dyspnea on exertion, peripheral edema.  Respiratory: Negative for dyspnea at rest, dyspnea on exertion, cough, sputum, wheezing.  GI: See history of present illness. GU:  Negative for dysuria, hematuria, urinary incontinence, urinary frequency, nocturnal urination.  MS: Negative for joint pain, low back pain.  Derm: Negative for rash or itching.  Neuro: Negative for weakness, abnormal sensation, seizure, frequent headaches, memory loss, confusion.  Psych: Negative for anxiety, depression, suicidal ideation, hallucinations.  Endo: Negative for unusual weight change.  Heme: Negative for bruising or bleeding. Allergy: Negative for rash or hives.    Physical Examination:  BP 116/78 mmHg  Pulse 90  Temp(Src) 97.9 F (36.6 C)  Ht 5\' 5"  (1.651 m)  Wt 221 lb 6.4 oz (100.426 kg)  BMI 36.84 kg/m2   General: Well-nourished, well-developed in no acute distress.  Head: Normocephalic, atraumatic.   Eyes: Conjunctiva pink, no icterus. Mouth: Oropharyngeal mucosa moist and pink , no lesions erythema or exudate. Neck: Supple without thyromegaly, masses, or lymphadenopathy.  Lungs: Clear to auscultation bilaterally.  Heart: Regular rate and rhythm, no murmurs rubs or gallops.  Abdomen: Bowel sounds are normal,  nontender, nondistended, no hepatosplenomegaly or masses, no abdominal bruits or    hernia , no rebound or guarding.   Rectal: not performed Extremities: No lower extremity edema. No clubbing or deformities.  Neuro: Alert and oriented x 4 , grossly normal neurologically.  Skin: Warm and dry, no rash or jaundice.   Psych: Alert and cooperative, normal mood and affect.  Labs:Old labs  Lab Results  Component Value Date   WBC 6.8 04/14/2014   HGB 11.8* 04/14/2014   HCT 37.1 04/14/2014   MCV 82.3 04/14/2014   PLT 302 04/14/2014   Lab Results  Component Value Date   CREATININE 0.81 09/09/2014   BUN 19 09/09/2014   NA  137 09/09/2014   K 4.8 09/09/2014   CL 100 09/09/2014   CO2 26 09/09/2014   Lab Results  Component Value Date   ALT 109* 04/14/2014   AST 59* 04/14/2014   ALKPHOS 71 04/14/2014   BILITOT 0.6 04/14/2014     Imaging Studies: No results found.

## 2015-05-06 ENCOUNTER — Other Ambulatory Visit (HOSPITAL_COMMUNITY): Payer: Self-pay | Admitting: Anesthesiology

## 2015-05-09 NOTE — Assessment & Plan Note (Signed)
Recheck with upcoming labs.

## 2015-05-09 NOTE — Assessment & Plan Note (Addendum)
Colonoscopy to evaluate abnormal CT finding from 2015. Last TCS nearly 3 years ago at this point.  I have discussed the risks, alternatives, benefits with regards to but not limited to the risk of reaction to medication, bleeding, infection, perforation and the patient is agreeable to proceed. Written consent to be obtained.  Day of before procedure. 1/2 dose Lantus and Novolog.

## 2015-05-12 ENCOUNTER — Other Ambulatory Visit: Payer: Self-pay

## 2015-05-12 DIAGNOSIS — R9389 Abnormal findings on diagnostic imaging of other specified body structures: Secondary | ICD-10-CM

## 2015-05-12 MED ORDER — PEG 3350-KCL-NA BICARB-NACL 420 G PO SOLR: 4000.0000 mL | Freq: Once | ORAL | Status: DC

## 2015-05-12 NOTE — Progress Notes (Signed)
cc'ed to pcp °

## 2015-05-28 ENCOUNTER — Encounter (HOSPITAL_COMMUNITY)
Admission: RE | Disposition: A | Discharge status: Home / Self Care | Payer: Self-pay | Source: Ambulatory Visit | Attending: Internal Medicine

## 2015-05-28 ENCOUNTER — Encounter (HOSPITAL_COMMUNITY): Payer: Self-pay

## 2015-05-28 ENCOUNTER — Ambulatory Visit (HOSPITAL_COMMUNITY)
Admission: RE | Admit: 2015-05-28 | Discharge: 2015-05-28 | Disposition: A | Discharge status: Home / Self Care | Payer: Medicare Other | Source: Ambulatory Visit | Attending: Internal Medicine | Admitting: Internal Medicine

## 2015-05-28 DIAGNOSIS — Z6835 Body mass index (BMI) 35.0-35.9, adult: Secondary | ICD-10-CM | POA: Diagnosis not present

## 2015-05-28 DIAGNOSIS — K5730 Diverticulosis of large intestine without perforation or abscess without bleeding: Secondary | ICD-10-CM | POA: Insufficient documentation

## 2015-05-28 DIAGNOSIS — Z79899 Other long term (current) drug therapy: Secondary | ICD-10-CM | POA: Diagnosis not present

## 2015-05-28 DIAGNOSIS — Z7982 Long term (current) use of aspirin: Secondary | ICD-10-CM | POA: Diagnosis not present

## 2015-05-28 DIAGNOSIS — E079 Disorder of thyroid, unspecified: Secondary | ICD-10-CM | POA: Insufficient documentation

## 2015-05-28 DIAGNOSIS — E669 Obesity, unspecified: Secondary | ICD-10-CM | POA: Insufficient documentation

## 2015-05-28 DIAGNOSIS — R933 Abnormal findings on diagnostic imaging of other parts of digestive tract: Secondary | ICD-10-CM | POA: Diagnosis not present

## 2015-05-28 DIAGNOSIS — R938 Abnormal findings on diagnostic imaging of other specified body structures: Secondary | ICD-10-CM | POA: Diagnosis not present

## 2015-05-28 DIAGNOSIS — I2510 Atherosclerotic heart disease of native coronary artery without angina pectoris: Secondary | ICD-10-CM | POA: Insufficient documentation

## 2015-05-28 DIAGNOSIS — Z794 Long term (current) use of insulin: Secondary | ICD-10-CM | POA: Insufficient documentation

## 2015-05-28 DIAGNOSIS — R9389 Abnormal findings on diagnostic imaging of other specified body structures: Secondary | ICD-10-CM | POA: Insufficient documentation

## 2015-05-28 DIAGNOSIS — Z87891 Personal history of nicotine dependence: Secondary | ICD-10-CM | POA: Diagnosis present

## 2015-05-28 HISTORY — PX: COLONOSCOPY: SHX5424

## 2015-05-28 SURGERY — COLONOSCOPY
Anesthesia: Moderate Sedation

## 2015-05-28 MED ORDER — ONDANSETRON HCL 4 MG/2ML IJ SOLN
INTRAMUSCULAR | Status: AC
  Filled 2015-05-28: qty 2

## 2015-05-28 MED ORDER — MIDAZOLAM HCL 5 MG/5ML IJ SOLN
INTRAMUSCULAR | Status: DC | PRN
  Administered 2015-05-28 (×2): 2 mg via INTRAVENOUS
  Administered 2015-05-28: 1 mg via INTRAVENOUS

## 2015-05-28 MED ORDER — SODIUM CHLORIDE 0.9 % IV SOLN
INTRAVENOUS | Status: DC
  Administered 2015-05-28: 09:00:00 via INTRAVENOUS

## 2015-05-28 MED ORDER — MIDAZOLAM HCL 5 MG/5ML IJ SOLN
INTRAMUSCULAR | Status: AC
  Filled 2015-05-28: qty 10

## 2015-05-28 MED ORDER — MEPERIDINE HCL 100 MG/ML IJ SOLN
INTRAMUSCULAR | Status: DC | PRN
  Administered 2015-05-28: 50 mg via INTRAVENOUS
  Administered 2015-05-28: 25 mg via INTRAVENOUS

## 2015-05-28 MED ORDER — STERILE WATER FOR IRRIGATION IR SOLN
Status: DC | PRN
  Administered 2015-05-28: 10:00:00

## 2015-05-28 MED ORDER — ONDANSETRON HCL 4 MG/2ML IJ SOLN
INTRAMUSCULAR | Status: DC | PRN
  Administered 2015-05-28: 4 mg via INTRAVENOUS

## 2015-05-28 MED ORDER — MEPERIDINE HCL 100 MG/ML IJ SOLN
INTRAMUSCULAR | Status: AC
  Filled 2015-05-28: qty 2

## 2015-05-28 NOTE — H&P (View-Only) (Signed)
Primary Care Physician:  HAWKINS,EDWARD L, MD  Primary Gastroenterologist:  Michael Rourk, MD   Chief Complaint  Patient presents with  . Follow-up    HPI:  [name] is a 64 y.o. female here for follow up. Last seen in 06/2014 for abnormal CT and to determine whether she needs to have a colonoscopy or sigmoidoscopy. CT of the abdomen and pelvis without contrast July 2015 showed 1.5 cm rounded density seen within the lumen of the sigmoid colon, question stool versus mass versus polyp. She had bump in LFTs in July. Abd u/s showed ?hepatic steatosis. Last TCS 08/2012.   Currently patient is feeling fairly well. BM are regular on Miralax. No N/V. No abdominal pain. No rectal bleeding or melena. Appetite fair. No taste to food. No heartburn. No unintentional weight loss.    Current Outpatient Prescriptions  Medication Sig Dispense Refill  . acetaminophen (TYLENOL) 500 MG tablet Take 1,000 mg by mouth every 6 (six) hours as needed for moderate pain.     . aspirin 81 MG chewable tablet Chew 1 tablet (81 mg total) by mouth daily.    . atorvastatin (LIPITOR) 80 MG tablet Take 1 tablet (80 mg total) by mouth daily at 6 PM. 30 tablet 0  . carvedilol (COREG) 12.5 MG tablet Take 1 tablet (12.5 mg total) by mouth 2 (two) times daily with a meal. 180 tablet 3  . furosemide (LASIX) 40 MG tablet Take 1 tablet (40 mg total) by mouth daily. 90 tablet 3  . insulin glargine (LANTUS) 100 UNIT/ML injection Inject 40 Units into the skin 2 (two) times daily.    . lisinopril (PRINIVIL,ZESTRIL) 2.5 MG tablet Take 1 tablet (2.5 mg total) by mouth 2 (two) times daily. 60 tablet 3  . metoCLOPramide (REGLAN) 5 MG tablet Take 1 tablet (5 mg total) by mouth 4 (four) times daily -  before meals and at bedtime. 120 tablet 5  . NOVOLOG 100 UNIT/ML injection Inject 10 Units into the skin 3 (three) times daily with meals.     . polyethylene glycol powder (GLYCOLAX/MIRALAX) powder Take 17 g by mouth once. Each day in no  more than 1/2 cup of liquid (4 ounces) 255 g 6  . spironolactone (ALDACTONE) 25 MG tablet Take 1 tablet (25 mg total) by mouth daily. 90 tablet 3   No current facility-administered medications for this visit.    Allergies as of 05/05/2015 - Review Complete 05/05/2015  Allergen Reaction Noted  . Other Other (See Comments) 03/25/2014    Past Medical History  Diagnosis Date  . Diabetes mellitus   . Hypertension   . Arthritis   . TTP (thrombotic thrombocytopenic purpura)   . Erosive esophagitis   . Gastritis   . Anemia   . Thyroid disease   . Duodenal ulcer     Age 19  . Obese   . Short-term memory loss   . Chronic systolic heart failure     a. ECHO (03/2014): EF 25-30%, akinesis enteroanteroseptal myocardium, grade III DD b. RHC (03/2014) RA 4, RV 42/4, PA 44/14 (26), PCWP 21, PA 62% Fick CO/CI 6.9 / 3.4  . Ischemic cardiomyopathy   . CAD (coronary artery disease)     a. LHC (03/2014) Lmain: nl, LAD: diff dz proximal 20%, 30-40% dz mid vessel prior 2nd diagonal, LCx: 40% in OM1, 20-30% in OM2, RCA: 99% subtotal occlusion @ crux, TIMI 1 flow, L-R collaterals to distal vessel    Past Surgical History  Procedure Laterality Date  .   Splenectomy, partial    . Ectopic pregnancy surgery    . Esophagogastroduodenoscopy  04/20/2012    NUR:Erosive antral gastritis/Erosive reflux esophagitis  . Colonoscopy  08/29/2012    RMR:Colonic diverticulosis  . Left and right heart catheterization with coronary angiogram N/A 03/28/2014    Procedure: LEFT AND RIGHT HEART CATHETERIZATION WITH CORONARY ANGIOGRAM;  Surgeon: Peter M Jordan, MD;  Location: [facility] CATH [facility];  Service: Cardiovascular;  Laterality: N/A;    Family History  Problem Relation Age of Onset  . Heart failure Mother   . Cirrhosis Father     ETOH  . Colon cancer Neg Hx   . Diabetes Daughter     Social History   Social History  . Marital Status: Divorced    Spouse Name: N/A  . Number of Children: 3  . Years of Education: N/A    Occupational History  . disabled    Social History Main Topics  . Smoking status: Former Smoker -- 0.25 packs/day for 10 years    Types: Cigarettes    Start date: 09/20/1966    Quit date: 09/20/1978  . Smokeless tobacco: Not on file     Comment: Quit 2008  . Alcohol Use: No  . Drug Use: No  . Sexual Activity: Not on file   Other Topics Concern  . Not on file   Social History Narrative   Lives w/ daughter, Stacy Hart            ROS:  General: Negative for anorexia, weight loss, fever, chills, fatigue, weakness. Eyes: Negative for vision changes.  ENT: Negative for hoarseness, difficulty swallowing , nasal congestion. CV: Negative for chest pain, angina, palpitations, dyspnea on exertion, peripheral edema.  Respiratory: Negative for dyspnea at rest, dyspnea on exertion, cough, sputum, wheezing.  GI: See history of present illness. GU:  Negative for dysuria, hematuria, urinary incontinence, urinary frequency, nocturnal urination.  MS: Negative for joint pain, low back pain.  Derm: Negative for rash or itching.  Neuro: Negative for weakness, abnormal sensation, seizure, frequent headaches, memory loss, confusion.  Psych: Negative for anxiety, depression, suicidal ideation, hallucinations.  Endo: Negative for unusual weight change.  Heme: Negative for bruising or bleeding. Allergy: Negative for rash or hives.    Physical Examination:  BP 116/78 mmHg  Pulse 90  Temp(Src) 97.9 F (36.6 C)  Ht 5' 5" (1.651 m)  Wt 221 lb 6.4 oz (100.426 kg)  BMI 36.84 kg/m2   General: Well-nourished, well-developed in no acute distress.  Head: Normocephalic, atraumatic.   Eyes: Conjunctiva pink, no icterus. Mouth: Oropharyngeal mucosa moist and pink , no lesions erythema or exudate. Neck: Supple without thyromegaly, masses, or lymphadenopathy.  Lungs: Clear to auscultation bilaterally.  Heart: Regular rate and rhythm, no murmurs rubs or gallops.  Abdomen: Bowel sounds are normal,  nontender, nondistended, no hepatosplenomegaly or masses, no abdominal bruits or    hernia , no rebound or guarding.   Rectal: not performed Extremities: No lower extremity edema. No clubbing or deformities.  Neuro: Alert and oriented x 4 , grossly normal neurologically.  Skin: Warm and dry, no rash or jaundice.   Psych: Alert and cooperative, normal mood and affect.  Labs:Old labs  Lab Results  Component Value Date   WBC 6.8 04/14/2014   HGB 11.8* 04/14/2014   HCT 37.1 04/14/2014   MCV 82.3 04/14/2014   PLT 302 04/14/2014   Lab Results  Component Value Date   CREATININE 0.81 09/09/2014   BUN 19 09/09/2014   NA   137 09/09/2014   K 4.8 09/09/2014   CL 100 09/09/2014   CO2 26 09/09/2014   Lab Results  Component Value Date   ALT 109* 04/14/2014   AST 59* 04/14/2014   ALKPHOS 71 04/14/2014   BILITOT 0.6 04/14/2014     Imaging Studies: No results found.    

## 2015-05-28 NOTE — Discharge Instructions (Signed)
Colonoscopy, Care After Refer to this sheet in the next few weeks. These instructions provide you with information on caring for yourself after your procedure. Your health care provider may also give you more specific instructions. Your treatment has been planned according to current medical practices, but problems sometimes occur. Call your health care provider if you have any problems or questions after your procedure. WHAT TO EXPECT AFTER THE PROCEDURE  After your procedure, it is typical to have the following:  A small amount of blood in your stool.  Moderate amounts of gas and mild abdominal cramping or bloating. HOME CARE INSTRUCTIONS  Do not drive, operate machinery, or sign important documents for 24 hours.  You may shower and resume your regular physical activities, but move at a slower pace for the first 24 hours.  Take frequent rest periods for the first 24 hours.  Walk around or put a warm pack on your abdomen to help reduce abdominal cramping and bloating.  Drink enough fluids to keep your urine clear or pale yellow.  You may resume your normal diet as instructed by your health care provider. Avoid heavy or fried foods that are hard to digest.  Avoid drinking alcohol for 24 hours or as instructed by your health care provider.  Only take over-the-counter or prescription medicines as directed by your health care provider.  If a tissue sample (biopsy) was taken during your procedure:  Do not take aspirin or blood thinners for 7 days, or as instructed by your health care provider.  Do not drink alcohol for 7 days, or as instructed by your health care provider.  Eat soft foods for the first 24 hours. SEEK MEDICAL CARE IF: You have persistent spotting of blood in your stool 2-3 days after the procedure. SEEK IMMEDIATE MEDICAL CARE IF:  You have more than a small spotting of blood in your stool.  You pass large blood clots in your stool.  Your abdomen is swollen  (distended).  You have nausea or vomiting.  You have a fever.  You have increasing abdominal pain that is not relieved with medicine. Document Released: 04/20/2004 Document Revised: 06/27/2013 Document Reviewed: 05/14/2013 [facility] Patient Information 2015 [place], [place]. This information is not intended to replace advice given to you by your health care provider. Make sure you discuss any questions you have with your health care provider.   Repeat colonoscopy in 10 years. Diverticulosis information provided.  Diverticulosis Diverticulosis is the condition that develops when small pouches (diverticula) form in the wall of your colon. Your colon, or large intestine, is where water is absorbed and stool is formed. The pouches form when the inside layer of your colon pushes through weak spots in the outer layers of your colon. CAUSES  No one knows exactly what causes diverticulosis. RISK FACTORS  Being older than 98. Your risk for this condition increases with age. Diverticulosis is rare in people younger than 40 years. By age 26, almost everyone has it.  Eating a low-fiber diet.  Being frequently constipated.  Being overweight.  Not getting enough exercise.  Smoking.  Taking over-the-counter pain medicines, like aspirin and ibuprofen. SYMPTOMS  Most people with diverticulosis do not have symptoms. DIAGNOSIS  Because diverticulosis often has no symptoms, health care providers often discover the condition during an exam for other colon problems. In many cases, a health care provider will diagnose diverticulosis while using a flexible scope to examine the colon (colonoscopy). TREATMENT  If you have never developed an infection related  to diverticulosis, you may not need treatment. If you have had an infection before, treatment may include:  Eating more fruits, vegetables, and grains.  Taking a fiber supplement.  Taking a live bacteria supplement (probiotic).  Taking medicine  to relax your colon. HOME CARE INSTRUCTIONS   Drink at least 6-8 glasses of water each day to prevent constipation.  Try not to strain when you have a bowel movement.  Keep all follow-up appointments. If you have had an infection before:  Increase the fiber in your diet as directed by your health care provider or dietitian.  Take a dietary fiber supplement if your health care provider approves.  Only take medicines as directed by your health care provider. SEEK MEDICAL CARE IF:   You have abdominal pain.  You have bloating.  You have cramps.  You have not gone to the bathroom in 3 days. SEEK IMMEDIATE MEDICAL CARE IF:   Your pain gets worse.  Yourbloating becomes very bad.  You have a fever or chills, and your symptoms suddenly get worse.  You begin vomiting.  You have bowel movements that are bloody or black. MAKE SURE YOU:  Understand these instructions.  Will watch your condition.  Will get help right away if you are not doing well or get worse. Document Released: 06/03/2004 Document Revised: 09/11/2013 Document Reviewed: 08/01/2013 [facility] Patient Information 2015 [place], [place]. This information is not intended to replace advice given to you by your health care provider. Make sure you discuss any questions you have with your health care provider.

## 2015-05-28 NOTE — Op Note (Signed)
[facility] [address] [place], [address]   COLONOSCOPY PROCEDURE REPORT  PATIENT: [name], [name]  MR#: [id number] BIRTHDATE: [birth year] , 72  yrs. old GENDER: female ENDOSCOPIST: R.  Garfield Cornea, MD FACP [facility] REFERRED BS:WHQPRF Luan Pulling, M.D. PROCEDURE DATE:  2015/05/31 PROCEDURE:   Colonoscopy, diagnostic INDICATIONS:Abnormal left colon suggested on CT. MEDICATIONS: Versed 5 mg IV and Demerol 75 mg IV in divided doses. Zofran 4 mg IV. ASA CLASS:       Class II  CONSENT: The risks, benefits, alternatives and imponderables including but not limited to bleeding, perforation as well as the possibility of a missed lesion have been reviewed.  The potential for biopsy, lesion removal, etc. have also been discussed. Questions have been answered.  All parties agreeable.  Please see the history and physical in the medical record for more information.  DESCRIPTION OF PROCEDURE:   After the risks benefits and alternatives of the procedure were thoroughly explained, informed consent was obtained.  The digital rectal exam revealed no abnormalities of the rectum.   The EC-[id number] ([id number])  endoscope was introduced through the anus and advanced to the cecum, which was identified by both the appendix and ileocecal valve. No adverse events experienced.   The quality of the prep was adequate  The instrument was then slowly withdrawn as the colon was fully examined. Estimated blood loss is zero unless otherwise noted in this procedure report.      COLON FINDINGS: Normal-appearing rectal mucosa; Somewhat redundant colon.  Shallow left-sided diverticula; otherwise, normal-appearing colonic mucosa.  Retroflexion was performed. .  Withdrawal time=8 minutes 0 seconds.  The scope was withdrawn and the procedure completed. COMPLICATIONS: There were no immediate complications.  ENDOSCOPIC IMPRESSION: Colonic diverticulosis. I suspect CT findings was either  artifactual or represent of stool in the lumen of the left colon.  RECOMMENDATIONS: Consider 1 more screening colonoscopy in 10 years only if overall health permits  eSigned:  R. Garfield Cornea, MD [facility]al[facility] 05-31-2015 10:47 AM   cc:  CPT CODES: ICD CODES:  The ICD and CPT codes recommended by this software are interpretations from the data that the clinical staff has captured with the software.  The verification of the translation of this report to the ICD and CPT codes and modifiers is the sole responsibility of the health care institution and practicing physician where this report was generated.  [employer] will not be held responsible for the validity of the ICD and CPT codes included on this report.  AMA assumes no liability for data contained or not contained herein. CPT is a Designer, television/film set of the [employer].

## 2015-05-28 NOTE — Interval H&P Note (Signed)
History and Physical Interval Note:  05/28/2015 9:55 AM  [name]  has presented today for surgery, with the diagnosis of abnormal CT  The various methods of treatment have been discussed with the patient and family. After consideration of risks, benefits and other options for treatment, the patient has consented to  Procedure(s) with comments: COLONOSCOPY (N/A) - 1015 as a surgical intervention .  The patient's history has been reviewed, patient examined, no change in status, stable for surgery.  I have reviewed the patient's chart and labs.  Questions were answered to the patient's satisfaction.     Robert Rourk   No change.  Colonoscopy per plan today.  The risks, benefits, limitations, alternatives and imponderables have been reviewed with the patient. Questions have been answered. All parties are agreeable.

## 2015-05-29 ENCOUNTER — Encounter (HOSPITAL_COMMUNITY): Payer: Self-pay | Admitting: Internal Medicine

## 2015-05-29 LAB — GLUCOSE, CAPILLARY: GLUCOSE-CAPILLARY: 83 mg/dL (ref 65–99)

## 2015-06-14 ENCOUNTER — Encounter (HOSPITAL_COMMUNITY): Payer: Self-pay | Admitting: *Deleted

## 2015-06-14 ENCOUNTER — Emergency Department (HOSPITAL_COMMUNITY)
Admission: EM | Admit: 2015-06-14 | Discharge: 2015-06-15 | Disposition: A | Discharge status: Home / Self Care | Payer: Medicare Other | Attending: Emergency Medicine | Admitting: Emergency Medicine

## 2015-06-14 DIAGNOSIS — Z87891 Personal history of nicotine dependence: Secondary | ICD-10-CM | POA: Diagnosis not present

## 2015-06-14 DIAGNOSIS — I5022 Chronic systolic (congestive) heart failure: Secondary | ICD-10-CM | POA: Insufficient documentation

## 2015-06-14 DIAGNOSIS — M1990 Unspecified osteoarthritis, unspecified site: Secondary | ICD-10-CM | POA: Diagnosis not present

## 2015-06-14 DIAGNOSIS — I2510 Atherosclerotic heart disease of native coronary artery without angina pectoris: Secondary | ICD-10-CM | POA: Insufficient documentation

## 2015-06-14 DIAGNOSIS — I10 Essential (primary) hypertension: Secondary | ICD-10-CM | POA: Insufficient documentation

## 2015-06-14 DIAGNOSIS — Z79899 Other long term (current) drug therapy: Secondary | ICD-10-CM | POA: Insufficient documentation

## 2015-06-14 DIAGNOSIS — Z794 Long term (current) use of insulin: Secondary | ICD-10-CM | POA: Insufficient documentation

## 2015-06-14 DIAGNOSIS — E669 Obesity, unspecified: Secondary | ICD-10-CM | POA: Diagnosis not present

## 2015-06-14 DIAGNOSIS — K529 Noninfective gastroenteritis and colitis, unspecified: Secondary | ICD-10-CM | POA: Diagnosis not present

## 2015-06-14 DIAGNOSIS — Z7982 Long term (current) use of aspirin: Secondary | ICD-10-CM | POA: Diagnosis not present

## 2015-06-14 DIAGNOSIS — Z862 Personal history of diseases of the blood and blood-forming organs and certain disorders involving the immune mechanism: Secondary | ICD-10-CM | POA: Insufficient documentation

## 2015-06-14 DIAGNOSIS — R112 Nausea with vomiting, unspecified: Secondary | ICD-10-CM | POA: Diagnosis present

## 2015-06-14 DIAGNOSIS — E119 Type 2 diabetes mellitus without complications: Secondary | ICD-10-CM | POA: Insufficient documentation

## 2015-06-14 DIAGNOSIS — R531 Weakness: Secondary | ICD-10-CM | POA: Diagnosis not present

## 2015-06-14 LAB — COMPREHENSIVE METABOLIC PANEL
ALBUMIN: 4 g/dL (ref 3.5–5.0)
ALK PHOS: 90 U/L (ref 38–126)
ALT: 23 U/L (ref 14–54)
ANION GAP: 8 (ref 5–15)
AST: 20 U/L (ref 15–41)
BUN: 16 mg/dL (ref 6–20)
CALCIUM: 9.3 mg/dL (ref 8.9–10.3)
CHLORIDE: 100 mmol/L — AB (ref 101–111)
CO2: 28 mmol/L (ref 22–32)
CREATININE: 0.82 mg/dL (ref 0.44–1.00)
GFR calc non Af Amer: >60 mL/min (ref >60)
GLUCOSE: 106 mg/dL — AB (ref 65–99)
Potassium: 4.4 mmol/L (ref 3.5–5.1)
SODIUM: 136 mmol/L (ref 135–145)
Total Bilirubin: 0.8 mg/dL (ref 0.3–1.2)
Total Protein: 8.7 g/dL — ABNORMAL HIGH (ref 6.5–8.1)

## 2015-06-14 LAB — CBG MONITORING, ED: GLUCOSE-CAPILLARY: 101 mg/dL — AB (ref 65–99)

## 2015-06-14 LAB — CBC WITH DIFFERENTIAL/PLATELET
BASOS PCT: 0 %
Basophils Absolute: 0 10*3/uL (ref 0.0–0.1)
EOS ABS: 0.1 10*3/uL (ref 0.0–0.7)
EOS PCT: 1 %
HCT: 34.2 % — ABNORMAL LOW (ref 36.0–46.0)
HEMOGLOBIN: 11.3 g/dL — AB (ref 12.0–15.0)
Lymphocytes Relative: 14 %
Lymphs Abs: 1.3 10*3/uL (ref 0.7–4.0)
MCH: 27.7 pg (ref 26.0–34.0)
MCHC: 33 g/dL (ref 30.0–36.0)
MCV: 83.8 fL (ref 78.0–100.0)
Monocytes Absolute: 0.7 10*3/uL (ref 0.1–1.0)
Monocytes Relative: 7 %
NEUTROS PCT: 78 %
Neutro Abs: 7.1 10*3/uL (ref 1.7–7.7)
PLATELETS: 221 10*3/uL (ref 150–400)
RBC: 4.08 MIL/uL (ref 3.87–5.11)
RDW: 16.3 % — ABNORMAL HIGH (ref 11.5–15.5)
WBC: 9.1 10*3/uL (ref 4.0–10.5)

## 2015-06-14 LAB — TROPONIN I

## 2015-06-14 LAB — LIPASE, BLOOD: Lipase: 12 U/L — ABNORMAL LOW (ref 22–51)

## 2015-06-14 MED ORDER — SODIUM CHLORIDE 0.9 % IV BOLUS (SEPSIS)
500.0000 mL | Freq: Once | INTRAVENOUS | Status: AC
  Administered 2015-06-14: 1000 mL via INTRAVENOUS

## 2015-06-14 MED ORDER — ONDANSETRON HCL 4 MG/2ML IJ SOLN
4.0000 mg | Freq: Once | INTRAMUSCULAR | Status: AC
  Administered 2015-06-14: 4 mg via INTRAVENOUS
  Filled 2015-06-14: qty 2

## 2015-06-14 MED ORDER — SODIUM CHLORIDE 0.9 % IV BOLUS (SEPSIS)
1000.0000 mL | Freq: Once | INTRAVENOUS | Status: AC
  Administered 2015-06-14: 1000 mL via INTRAVENOUS

## 2015-06-14 MED ORDER — PROMETHAZINE HCL 25 MG/ML IJ SOLN
12.5000 mg | Freq: Once | INTRAMUSCULAR | Status: AC
  Administered 2015-06-14: 12.5 mg via INTRAVENOUS
  Filled 2015-06-14: qty 1

## 2015-06-14 MED ORDER — MORPHINE SULFATE (PF) 2 MG/ML IV SOLN
2.0000 mg | Freq: Once | INTRAVENOUS | Status: AC
  Administered 2015-06-14: 2 mg via INTRAVENOUS
  Filled 2015-06-14: qty 1

## 2015-06-14 MED ORDER — SODIUM CHLORIDE 0.9 % IV BOLUS (SEPSIS): 500.0000 mL | Freq: Once | INTRAVENOUS | Status: DC

## 2015-06-14 MED ORDER — ONDANSETRON 8 MG PO TBDP: 8.0000 mg | ORAL_TABLET | Freq: Three times a day (TID) | ORAL | Status: DC | PRN

## 2015-06-14 NOTE — ED Notes (Signed)
Pt reporting nausea and vomiting starting this morning.  Denies diarrhea. States that she is "just now starting to have some abdominal pain." reporting pain in lower portion of abdomen.

## 2015-06-14 NOTE — ED Notes (Signed)
Pt reports she is a diabetic and took insulin. Cbg at 3 pm 80. Lower abdomen has started hurting

## 2015-06-14 NOTE — ED Provider Notes (Signed)
CSN: [id number]     Arrival date & time 06/14/15  1944 History  This chart was scribed for Nat Christen, MD by Helane Gunther, ED Scribe. This patient was seen in room APA19/APA19 and the patient's care was started at 8:10 PM.    Chief Complaint  Patient presents with  . Nausea  . Emesis    The history is provided by the patient and a relative. No language interpreter was used.   HPI Comments: [name] is a 64 y.o. female with a PMHx of DM, HTN, CHF, and CAD who presents to the Emergency Department complaining of intermittent nausea and emesis onset 7 AM this morning. She states the vomit is clear and bitter tasting. She reports associated lower abdominal pain (currently rated as an 8/10) and weakness. She notes she is able to walk at baseline, but usually uses a wheelchair. Per son, pt's heart issues usually begin with similar symptoms. Pt notes a PMHX of DM and states she has taken her insulin today, though her blood sugar was low this afternoon. Pt denies diarrhea, but notes she had one soft BM this morning.  Past Medical History  Diagnosis Date  . Diabetes mellitus   . Hypertension   . Arthritis   . TTP (thrombotic thrombocytopenic purpura)   . Erosive esophagitis   . Gastritis   . Anemia   . Thyroid disease   . Duodenal ulcer     Age 37  . Obese   . Short-term memory loss   . Chronic systolic heart failure     a. ECHO (03/2014): EF 25-30%, akinesis enteroanteroseptal myocardium, grade III DD b. RHC (03/2014) RA 4, RV 42/4, PA 44/14 (26), PCWP 21, PA 62% Fick CO/CI 6.9 / 3.4  . Ischemic cardiomyopathy   . CAD (coronary artery disease)     a. LHC (03/2014) Lmain: nl, LAD: diff dz proximal 20%, 30-40% dz mid vessel prior 2nd diagonal, LCx: 40% in OM1, 20-30% in OM2, RCA: 99% subtotal occlusion @ crux, TIMI 1 flow, L-R collaterals to distal vessel   Past Surgical History  Procedure Laterality Date  . Splenectomy, partial    . Ectopic pregnancy surgery    .  Esophagogastroduodenoscopy  04/20/2012    QBH:ALPFXTK antral gastritis/Erosive reflux esophagitis  . Colonoscopy  08/29/2012    WIO:[id number] diverticulosis  . Left and right heart catheterization with coronary angiogram N/A 03/28/2014    Procedure: LEFT AND RIGHT HEART CATHETERIZATION WITH CORONARY ANGIOGRAM;  Surgeon: Peter M Martinique, MD;  Location: [facility] CATH LAB;  Service: Cardiovascular;  Laterality: N/A;  . Colonoscopy N/A 05/28/2015    Procedure: COLONOSCOPY;  Surgeon: Daneil Dolin, MD;  Location: [facility] ENDO SU[facility];  Service: Endoscopy;  Laterality: N/A;  1015   Family History  Problem Relation Age of Onset  . Heart failure Mother   . Cirrhosis Father     ETOH  . Colon cancer Neg Hx   . Diabetes Daughter    Social History  Substance Use Topics  . Smoking status: Former Smoker -- 0.25 packs/day for 10 years    Types: Cigarettes    Start date: 09/20/1966    Quit date: 09/20/1978  . Smokeless tobacco: None     Comment: Quit 2008  . Alcohol Use: No   OB History    No data available     Review of Systems  Gastrointestinal: Positive for nausea, vomiting and abdominal pain. Negative for diarrhea.  Neurological: Positive for weakness.  All other systems reviewed and are  negative.   Allergies  Other  Home Medications   Prior to Admission medications   Medication Sig Start Date End Date Taking? Authorizing Demorris Choyce  aspirin 81 MG chewable tablet Chew 1 tablet (81 mg total) by mouth daily. 03/31/14  Yes Charlynne Cousins, MD  atorvastatin (LIPITOR) 80 MG tablet Take 1 tablet (80 mg total) by mouth daily at 6 PM. 03/31/14  Yes Charlynne Cousins, MD  carvedilol (COREG) 12.5 MG tablet Take 1 tablet (12.5 mg total) by mouth 2 (two) times daily with a meal. 10/30/14  Yes Arnoldo Lenis, MD  furosemide (LASIX) 40 MG tablet Take 1 tablet (40 mg total) by mouth daily. 10/30/14  Yes Arnoldo Lenis, MD  guaiFENesin (MUCINEX) 600 MG 12 hr tablet Take 600 mg by mouth daily.   Yes Historical  Callista Hoh, MD  hydrocortisone cream 0.5 % Apply 1 application topically 2 (two) times daily as needed for itching.   Yes Historical Doak Mah, MD  insulin glargine (LANTUS) 100 UNIT/ML injection Inject 40 Units into the skin 2 (two) times daily.   Yes Historical Raunel Dimartino, MD  lisinopril (PRINIVIL,ZESTRIL) 2.5 MG tablet Take 1 tablet (2.5 mg total) by mouth 2 (two) times daily. 05/29/14  Yes Rande Brunt, NP  metoCLOPramide (REGLAN) 5 MG tablet Take 1 tablet (5 mg total) by mouth 4 (four) times daily -  before meals and at bedtime. 04/15/14  Yes Marianne L York, PA-C  NOVOLOG 100 UNIT/ML injection Inject 10 Units into the skin 3 (three) times daily with meals.  03/15/14  Yes Historical Yerachmiel Spinney, MD  polyethylene glycol (MIRALAX / GLYCOLAX) packet Take 17 g by mouth daily.   Yes Historical Donata Reddick, MD  polyethylene glycol powder (GLYCOLAX/MIRALAX) powder Take 17 g by mouth once. Each day in no more than 1/2 cup of liquid (4 ounces) 04/15/14  Yes Bobby Rumpf York, PA-C  spironolactone (ALDACTONE) 25 MG tablet TAKE 1 TABLET BY MOUTH DAILY. 05/06/15  Yes Jolaine Artist, MD  acetaminophen (TYLENOL) 500 MG tablet Take 1,000 mg by mouth every 6 (six) hours as needed for moderate pain.     Historical Artrell Lawless, MD  ondansetron (ZOFRAN ODT) 8 MG disintegrating tablet Take 1 tablet (8 mg total) by mouth every 8 (eight) hours as needed for nausea or vomiting. 06/14/15   Nat Christen, MD  polyethylene glycol-electrolytes (NULYTELY/GOLYTELY) 420 G solution Take 4,000 mLs by mouth once. 05/12/15   Mahala Menghini, PA-C   BP 142/71 mmHg  Pulse 82  Temp(Src) 98.1 F (36.7 C) (Oral)  Resp 15  Ht 5\' 5"  (1.651 m)  Wt 210 lb (95.255 kg)  BMI 34.95 kg/m2  SpO2 95% Physical Exam  Constitutional: She is oriented to person, place, and time. She appears well-developed and well-nourished.  HENT:  Head: Normocephalic and atraumatic.  Eyes: Conjunctivae and EOM are normal. Pupils are equal, round, and reactive to light.   Neck: Normal range of motion. Neck supple.  Cardiovascular: Normal rate and regular rhythm.   Pulmonary/Chest: Effort normal and breath sounds normal.  Abdominal: Soft. Bowel sounds are normal. There is tenderness.  Minimal diffuse abdominal TTP  Musculoskeletal: Normal range of motion.  Neurological: She is alert and oriented to person, place, and time.  Skin: Skin is warm and dry.  Psychiatric: She has a normal mood and affect. Her behavior is normal.  Nursing note and vitals reviewed.   ED Course  Procedures  DIAGNOSTIC STUDIES: Oxygen Saturation is 98% on RA, normal by my interpretation.  COORDINATION OF CARE: 8:13 PM - Discussed plans to order IV fluids, anti-nausea medication, and pain medication. Will order diagnostic studies. Pt advised of plan for treatment and pt agrees.  Labs Review Labs Reviewed  CBC WITH DIFFERENTIAL/PLATELET - Abnormal; Notable for the following:    Hemoglobin 11.3 (*)    HCT 34.2 (*)    RDW 16.3 (*)    All other components within normal limits  COMPREHENSIVE METABOLIC PANEL - Abnormal; Notable for the following:    Chloride 100 (*)    Glucose, Bld 106 (*)    Total Protein 8.7 (*)    All other components within normal limits  LIPASE, BLOOD - Abnormal; Notable for the following:    Lipase 12 (*)    All other components within normal limits  CBG MONITORING, ED - Abnormal; Notable for the following:    Glucose-Capillary 101 (*)    All other components within normal limits  TROPONIN I    Imaging Review No results found. I have personally reviewed and evaluated these images and lab results as part of my medical decision-making.   EKG Interpretation   Date/Time:  Saturday June 14 2015 22:01:55 EDT Ventricular Rate:  80 PR Interval:  157 QRS Duration: 82 QT Interval:  390 QTC Calculation: 450 R Axis:   35 Text Interpretation:  Sinus rhythm Atrial premature complexes Confirmed by  Lacinda Axon  MD, BRIAN ([id number]) on 06/14/2015 10:10:49  PM      MDM   Final diagnoses:  Gastroenteritis    Patient is nontoxic-appearing. Screening labs including troponin and EKG were all negative. She feels better after 1.5 L of fluid and antiemetics. Discharge medications Zofran 8 mg ODT. I personally performed the services described in this documentation, which was scribed in my presence. The recorded information has been reviewed and is accurate.   Nat Christen, MD 06/14/15 [phone]

## 2015-06-14 NOTE — Discharge Instructions (Signed)
Lab work was good. Medication for nausea. Try to increase fluids.

## 2015-06-25 DIAGNOSIS — E1143 Type 2 diabetes mellitus with diabetic autonomic (poly)neuropathy: Secondary | ICD-10-CM | POA: Diagnosis not present

## 2015-06-25 DIAGNOSIS — Z23 Encounter for immunization: Secondary | ICD-10-CM | POA: Diagnosis not present

## 2015-06-25 DIAGNOSIS — I110 Hypertensive heart disease with heart failure: Secondary | ICD-10-CM | POA: Diagnosis not present

## 2015-06-25 DIAGNOSIS — E1165 Type 2 diabetes mellitus with hyperglycemia: Secondary | ICD-10-CM | POA: Diagnosis not present

## 2015-06-25 DIAGNOSIS — G4730 Sleep apnea, unspecified: Secondary | ICD-10-CM | POA: Diagnosis not present

## 2015-06-25 DIAGNOSIS — R21 Rash and other nonspecific skin eruption: Secondary | ICD-10-CM | POA: Diagnosis not present

## 2015-07-02 ENCOUNTER — Other Ambulatory Visit (HOSPITAL_COMMUNITY): Payer: Self-pay | Admitting: Pulmonary Disease

## 2015-07-02 DIAGNOSIS — R1310 Dysphagia, unspecified: Secondary | ICD-10-CM

## 2015-07-03 ENCOUNTER — Ambulatory Visit (INDEPENDENT_AMBULATORY_CARE_PROVIDER_SITE_OTHER): Payer: Medicare Other | Admitting: Cardiology

## 2015-07-03 ENCOUNTER — Encounter: Payer: Self-pay | Admitting: *Deleted

## 2015-07-03 ENCOUNTER — Encounter: Payer: Self-pay | Admitting: Cardiology

## 2015-07-03 VITALS — BP 121/77 | HR 83 | Ht 65.0 in | Wt 214.0 lb

## 2015-07-03 DIAGNOSIS — I2510 Atherosclerotic heart disease of native coronary artery without angina pectoris: Secondary | ICD-10-CM

## 2015-07-03 DIAGNOSIS — I5022 Chronic systolic (congestive) heart failure: Secondary | ICD-10-CM | POA: Diagnosis not present

## 2015-07-03 NOTE — Progress Notes (Signed)
Patient ID: [name], female   DOB: [birth year], 64 y.o.   MRN: [id number]     Clinical Summary [name] is a 64 y.o.female seen today for follow up of the following medical problems.   1. Chronic combined systolicand diastolic heart failure - echo 03/2014 LVEF 25-30%, restrictive diastolic dysfunction. New diagnosis at that time. Repeat echo 06/2014 LVEF 40-45% - cath 03/2014 showed RCA 99% with left to right collaterals, other arteries patent. Overall LV systolic dysfunction out of proportion to CAD.   - denies any SOB or DOE. Denies any LE edema. Weights down 11 lbs since 12/2014.  - compliant with meds. Limiting sodium intake. Avoiding NSAIDs. Chronic fatigue unchanged.  2. CAD - cath 03/2014 with 99% chronic RCA disease, otherwise patent vessels. Managed medically - notes some chest pain only when laying down. Gets pressure in midchest. No other associated symptoms. Better with deep breath.   3. OSA screen + snoring, + witnessed apneic episodes. + daytime somnolence - awaiting sleep study with Dr Luan Pulling   Past Medical History  Diagnosis Date  . Diabetes mellitus   . Hypertension   . Arthritis   . TTP (thrombotic thrombocytopenic purpura)   . Erosive esophagitis   . Gastritis   . Anemia   . Thyroid disease   . Duodenal ulcer     Age 64  . Obese   . Short-term memory loss   . Chronic systolic heart failure     a. ECHO (03/2014): EF 25-30%, akinesis enteroanteroseptal myocardium, grade III DD b. RHC (03/2014) RA 4, RV 42/4, PA 44/14 (26), PCWP 21, PA 62% Fick CO/CI 6.9 / 3.4  . Ischemic cardiomyopathy   . CAD (coronary artery disease)     a. LHC (03/2014) Lmain: nl, LAD: diff dz proximal 20%, 30-40% dz mid vessel prior 2nd diagonal, LCx: 40% in OM1, 20-30% in OM2, RCA: 99% subtotal occlusion @ crux, TIMI 1 flow, L-R collaterals to distal vessel     Allergies  Allergen Reactions  . Other Other (See Comments)    FLAGYL, CIPRO, PROTONIX taken at the same time caused  patient to lose her breath and have trouble breathing, requiring hospital stay at [facility], 03/23/14-per patient.      Current Outpatient Prescriptions  Medication Sig Dispense Refill  . acetaminophen (TYLENOL) 500 MG tablet Take 1,000 mg by mouth every 6 (six) hours as needed for moderate pain.     Marland Kitchen aspirin 81 MG chewable tablet Chew 1 tablet (81 mg total) by mouth daily.    Marland Kitchen atorvastatin (LIPITOR) 80 MG tablet Take 1 tablet (80 mg total) by mouth daily at 6 PM. 30 tablet 0  . carvedilol (COREG) 12.5 MG tablet Take 1 tablet (12.5 mg total) by mouth 2 (two) times daily with a meal. 180 tablet 3  . furosemide (LASIX) 40 MG tablet Take 1 tablet (40 mg total) by mouth daily. 90 tablet 3  . guaiFENesin (MUCINEX) 600 MG 12 hr tablet Take 600 mg by mouth daily.    . hydrocortisone cream 0.5 % Apply 1 application topically 2 (two) times daily as needed for itching.    . insulin glargine (LANTUS) 100 UNIT/ML injection Inject 40 Units into the skin 2 (two) times daily.    Marland Kitchen lisinopril (PRINIVIL,ZESTRIL) 2.5 MG tablet Take 1 tablet (2.5 mg total) by mouth 2 (two) times daily. 60 tablet 3  . metoCLOPramide (REGLAN) 5 MG tablet Take 1 tablet (5 mg total) by mouth 4 (four) times daily -  before  meals and at bedtime. 120 tablet 5  . NOVOLOG 100 UNIT/ML injection Inject 10 Units into the skin 3 (three) times daily with meals.     . ondansetron (ZOFRAN ODT) 8 MG disintegrating tablet Take 1 tablet (8 mg total) by mouth every 8 (eight) hours as needed for nausea or vomiting. 10 tablet 0  . polyethylene glycol (MIRALAX / GLYCOLAX) packet Take 17 g by mouth daily.    . polyethylene glycol powder (GLYCOLAX/MIRALAX) powder Take 17 g by mouth once. Each day in no more than 1/2 cup of liquid (4 ounces) 255 g 6  . polyethylene glycol-electrolytes (NULYTELY/GOLYTELY) 420 G solution Take 4,000 mLs by mouth once. 4000 mL 0  . spironolactone (ALDACTONE) 25 MG tablet TAKE 1 TABLET BY MOUTH DAILY. 90 tablet 1   No current  facility-administered medications for this visit.     Past Surgical History  Procedure Laterality Date  . Splenectomy, partial    . Ectopic pregnancy surgery    . Esophagogastroduodenoscopy  04/20/2012    [id number]:[id number] antral gastritis/Erosive reflux esophagitis  . Colonoscopy  08/29/2012    KDX:[id number] diverticulosis  . Left and right heart catheterization with coronary angiogram N/A 03/28/2014    Procedure: LEFT AND RIGHT HEART CATHETERIZATION WITH CORONARY ANGIOGRAM;  Surgeon: Peter M Martinique, MD;  Location: [facility] CATH LAB;  Service: Cardiovascular;  Laterality: N/A;  . Colonoscopy N/A 05/28/2015    Procedure: COLONOSCOPY;  Surgeon: Daneil Dolin, MD;  Location: [facility];  Service: Endoscopy;  Laterality: N/A;  1015     Allergies  Allergen Reactions  . Other Other (See Comments)    FLAGYL, CIPRO, PROTONIX taken at the same time caused patient to lose her breath and have trouble breathing, [facility] hospital [facility], [facility]23/14-per patient.       Family History  Problem Relation Age of Onset  . Heart failure Mother   . Cirrhosis Father     ETOH  . Colon cancer Neg Hx   . Diabetes Daughter      Social History [name] reports that she quit smoking about 36 years ago. Her smoking use included Cigarettes. She started smoking about 48 years ago. She has a 2.5 pack-year smoking history. She does not have any smokeless tobacco history on file. [name] reports that she does not drink alcohol.   Review of Systems CONSTITUTIONAL: No weight loss, fever, chills, weakness or fatigue.  HEENT: Eyes: No visual loss, blurred vision, double vision or yellow sclerae.No hearing loss, sneezing, congestion, runny nose or sore throat.  SKIN: No rash or itching.  CARDIOVASCULAR: per HPI RESPIRATORY: No shortness of breath, cough or sputum.  GASTROINTESTINAL: No anorexia, nausea, vomiting or diarrhea. No abdominal pain or blood.  GENITOURINARY: No burning on urination, no  polyuria NEUROLOGICAL: No headache, dizziness, syncope, paralysis, ataxia, numbness or tingling in the extremities. No change in bowel or bladder control.  MUSCULOSKELETAL: No muscle, back pain, joint pain or stiffness.  LYMPHATICS: No enlarged nodes. No history of splenectomy.  PSYCHIATRIC: No history of depression or anxiety.  ENDOCRINOLOGIC: No reports of sweating, cold or heat intolerance. No polyuria or polydipsia.  Marland Kitchen   Physical Examination Filed Vitals:   07/03/15 0951  BP: 121/77  Pulse: 83   Filed Vitals:   07/03/15 0951  Height: 5\' 5"  (1.651 m)  Weight: 214 lb (97.07 kg)    Gen: resting comfortably, no acute distress HEENT: no scleral icterus, pupils equal round and reactive, no palptable cervical adenopathy,  CV: RRR,  no m/r/,g no jvd Resp: Clear to auscultation bilaterally GI: abdomen is soft, non-tender, non-distended, normal bowel sounds, no hepatosplenomegaly MSK: extremities are warm, no edema.  Skin: warm, no rash Neuro:  no focal deficits Psych: appropriate affect   Diagnostic Studies 03/2014 echo Study Conclusions  - Left ventricle: The cavity size was normal. There was mild focal basal hypertrophy of the septum. Systolic function was severely reduced. The estimated ejection fraction was in the range of 25% to 30%. There is akinesis of the entireanteroseptal myocardium. Doppler parameters are consistent with a reversible restrictive pattern, indicative of decreased left ventricular diastolic compliance and/or increased left atrial pressure (grade 3 diastolic dysfunction). - Mitral valve: There was mild regurgitation. - Left atrium: The atrium was moderately dilated.  03/2014 Cath Procedural Findings:  Hemodynamics  RA 8/0 mean 4 mm Hg  RV 42/4 mm Hg  PA 44/14 mean 26 mm Hg  PCWP 20/28 mean 21 mm Hg  LV 98/18 mm Hg  AO 95/54 mean 72 mm Hg  Oxygen saturations:  PA 62%  AO 90%  Cardiac Output (Fick) 6.9 L/min  Cardiac Index (Fick)  3.4 L/min/meter squared  Coronary angiography:  Coronary dominance: right  Left mainstem: Normal  Left anterior descending (LAD): Diffuse disease in the proximal vessel up to 20%. 30-40% disease in the mid vessel prior to the second diagonal.  Left circumflex (LCx): 40% in OM1. 20-30% in OM2. Otherwise no significant disease.  Right coronary artery (RCA): 99% subtotal occlusion at the crux. TIMI 1 flow. Left to right collaterals to the distal vessel.  Left ventriculography: Left ventricular systolic function is abnormal, LVEF is estimated at 25%. There is inferior akinesis and severe global hypokinesis. There is mild mitral regurgitation  Final Conclusions:  1. Single vessel obstructive CAD with subtotal occlusion of the RCA at the crux.  2. Severe LV dysfunction.  3. Normal Right heart pressures. Elevated PCWP.  Recommendations: Medical management. Her degree of LV dysfunction is out of proportion to her CAD. She has no anginal symptoms so PCI of RCA not indicated. The inferior wall appears scarred.      Assessment and Plan   1. Chronic combined systolic/diastolic HF - NICM, LVEF 80-99% now up to 40-45%, NYHA II. Appears euvolemic - will repeat echo, if dysfunction persists will continue to titrate up medications over time  2. CAD - medically managed single vessel RCA disease, denies any symptoms - chest pain only with laying down suggests GERD, she is to try over the counter zantac 150mg  bid  3. OSA screen - awaiting sleep study, has upcoming appoitnment with Dr Luan Pulling.     F/u 3 months. Request labs from pcp  Arnoldo Lenis, M.D.

## 2015-07-03 NOTE — Patient Instructions (Signed)
Your physician recommends that you schedule a follow-up appointment in: 3 months with Dr. Harl Bowie  Your physician has recommended you make the following change in your medication:   [address] [place] physician has requested that you have an echocardiogram. Echocardiography is a painless test that uses sound waves to create images of your heart. It provides your doctor with information about the size and shape of your heart and how well your heart's chambers and valves are working. This procedure takes approximately one hour. There are no restrictions for this procedure.  Thank you for choosing [facility]!!

## 2015-07-16 ENCOUNTER — Ambulatory Visit (HOSPITAL_COMMUNITY)
Admission: RE | Admit: 2015-07-16 | Discharge: 2015-07-16 | Disposition: A | Discharge status: Home / Self Care | Payer: Medicare Other | Source: Ambulatory Visit | Attending: Pulmonary Disease | Admitting: Pulmonary Disease

## 2015-07-16 ENCOUNTER — Ambulatory Visit (HOSPITAL_COMMUNITY): Payer: Medicare Other | Attending: Pulmonary Disease | Admitting: Speech Pathology

## 2015-07-16 DIAGNOSIS — R05 Cough: Secondary | ICD-10-CM | POA: Diagnosis not present

## 2015-07-16 DIAGNOSIS — R1310 Dysphagia, unspecified: Secondary | ICD-10-CM | POA: Insufficient documentation

## 2015-07-16 NOTE — Therapy (Signed)
[place], [place], [address] Phone: [phone]   Fax:  [phone]  Modified Barium Swallow  Patient Details  Name: [name] MRN: [id number] Date of Birth: [birth year] No Data Recorded  Encounter Date: 07/16/2015      End of Session - 07/16/15 2258    Visit Number 1   Number of Visits 1   Authorization Type Medicare   SLP Start Time 4174   SLP Stop Time  1345   SLP Time Calculation (min) 30 min   Activity Tolerance Patient tolerated treatment well      Past Medical History  Diagnosis Date  . Diabetes mellitus   . Hypertension   . Arthritis   . TTP (thrombotic thrombocytopenic purpura) (HCC)   . Erosive esophagitis   . Gastritis   . Anemia   . Thyroid disease   . Duodenal ulcer     Age 45  . Obese   . Short-term memory loss   . Chronic systolic heart failure ([place])     a. ECHO (03/2014): EF 25-30%, akinesis enteroanteroseptal myocardium, grade III DD b. RHC (03/2014) RA 4, RV 42/4, PA 44/14 (26), PCWP 21, PA 62% Fick CO/CI 6.9 / 3.4  . Ischemic cardiomyopathy   . CAD (coronary artery disease)     a. LHC (03/2014) Lmain: nl, LAD: diff dz proximal 20%, 30-40% dz mid vessel prior 2nd diagonal, LCx: 40% in OM1, 20-30% in OM2, RCA: 99% subtotal occlusion @ crux, TIMI 1 flow, L-R collaterals to distal vessel    Past Surgical History  Procedure Laterality Date  . Splenectomy, partial    . Ectopic pregnancy surgery    . Esophagogastroduodenoscopy  04/20/2012    YCX:[id number] antral gastritis/Erosive reflux esophagitis  . Colonoscopy  08/29/2012    DJS:[id number] diverticulosis  . Left and right heart catheterization with coronary angiogram N/A 03/28/2014    Procedure: LEFT AND RIGHT HEART CATHETERIZATION WITH CORONARY ANGIOGRAM;  Surgeon: Peter M Martinique, MD;  Location: [facility] CATH LAB;  Service: Cardiovascular;  Laterality: N/A;  . Colonoscopy N/A 05/28/2015    Procedure: COLONOSCOPY;  Surgeon: Daneil Dolin, MD;   [facility];  Service: Endoscopy;  Laterality: N/A;  1015    There were no vitals filed for this visit.  Visit Diagnosis: Dysphagia      Subjective Assessment - 07/16/15 2254    Subjective "I get choked on foods and sometimes just start coughing."   Special Tests MBSS   Currently in Pain? No/denies             General - 07/16/15 2255    General Information   Date of Onset 06/25/15   Other Pertinent Information [name] is a 64 yo female who was referred by Dr. Luan Pulling for MBSS due to complaints of dysphagia which started about 2 months ago per pt.    Type of Study Other (Comment)  MBSS   Reason for Referral Objectively evaluate swallowing function   Diet Prior to this Study Regular;Thin liquids   Temperature Spikes Noted No   Respiratory Status Room air   History of Recent Intubation No   Behavior/Cognition Alert;Cooperative;Pleasant mood   Oral Cavity - Dentition Adequate natural dentition/normal for age   Oral Motor / Sensory Function Within functional limits   Self-Feeding Abilities Able to feed self   Patient Positioning Upright in chair/Tumbleform   Baseline Vocal Quality Normal   Volitional Cough Strong   Volitional Swallow Able to elicit  Anatomy Within functional limits   Pharyngeal Secretions Not observed secondary MBS            Oral Preparation/Oral Phase - 2015/07/25 2256    Oral Preparation/Oral Phase   Oral Phase Within functional limits          Pharyngeal Phase - 07/25/15 2256    Pharyngeal Phase   Pharyngeal Phase Within functional limits          Cricopharyngeal Phase - 07-25-15 12-29-54    Cervical Esophageal Phase   Cervical Esophageal Phase Within functional limits                  Plan - 25-Jul-2015 12/28/56    Clinical Impression Statement Oropharyngeal swallow is essentially [facility]. Swallow initiation is timely with no residuals in pharynx post swallow. No penetration or aspiration observed. Pt coughed  after 90% of swallows, however no objective correlate could be made during imaging. Pt did exhibit trace residuals at the level of the UES which was thought to possibly be a tiny Zenker's diverticulum, however after closer examination and confirmation from radiologist it was felt it was not. It would be unusual to elicit a cough response to the trace residuals near UES, however this is the only objective finding that exists.   The study was reviewed at length with pt. She reports that symptoms started about 2 months ago, but cannot recall if symptoms existed before her colonoscopy. Pt does not appear to be at risk for aspiration. Perhaps, her cough is due to seasonal allergies and/or reflux symptoms. Pt advised to swallow 2x for each bite/sip and implement reflux precautions. Pt also encouraged to sip on water and attempt to replace cough/throat clear with hard swallow when able. No further SLP intervention warranted at this time.           G-Codes - 07/25/2015 12-28-2257    Functional Assessment Tool Used MBSS; clinical judgment   Functional Limitations Swallowing   Swallow Current Status [phone]) 0 percent impaired, limited or restricted   Swallow Goal Status (C3762) 0 percent impaired, limited or restricted   Swallow Discharge Status [phone]) 0 percent impaired, limited or restricted          Recommendations/Treatment - 2015-07-25 2257    Swallow Evaluation Recommendations   SLP Diet Recommendations Age appropriate regular solids   Liquid Administration via Cup;Straw   Medication Administration Whole meds with liquid   Supervision Patient able to self feed   Compensations Small sips/bites;Multiple dry swallows after each bite/sip   Postural Changes Remain semi-upright after after feeds/meals (Comment);Seated upright at 90 degrees          Prognosis - Jul 25, 2015 2257    Prognosis   Prognosis for Safe Diet Advancement Good   Individuals Consulted   Consulted and Agree with Results and  Recommendations Patient      Problem List Patient Active Problem List   Diagnosis Date Noted  . Abnormal CT scan   . Diverticulosis of colon without hemorrhage   . Snoring 07/10/2014  . Abnormal LFTs 06/20/2014  . Abnormal CT scan, sigmoid colon 06/20/2014  . Nausea & vomiting 04/13/2014  . Cardiomyopathy, ischemic 04/04/2014  . HTN (hypertension) 04/04/2014  . Chronic combined systolic and diastolic CHF (congestive heart failure) ([place]) 04/04/2014  . Coronary artery disease 03/31/2014  . Type 2 diabetes mellitus with diabetic neuropathy ([place]) 03/31/2014  . DCM (dilated cardiomyopathy) ([place]) 03/29/2014  . Anemia 03/26/2014  . Diverticulitis large intestine 03/14/2014  . Gastroparesis 02/14/2014  .  Uncontrolled diabetes mellitus ([place]) 02/13/2014  . Chronic constipation 11/30/2012  . GERD (gastroesophageal reflux disease) 11/30/2012  . Fatty liver 11/30/2012   Thank you,  Genene Churn, [place]  [facility] 07/16/2015, 10:59 PM  [place], [place], [address] Phone: [phone]   Fax:  [phone]  Name: Chantay Whitelock MRN: [id number] Date of Birth: Mar 18, 1951

## 2015-07-24 ENCOUNTER — Other Ambulatory Visit: Payer: Self-pay

## 2015-07-24 ENCOUNTER — Ambulatory Visit (INDEPENDENT_AMBULATORY_CARE_PROVIDER_SITE_OTHER): Payer: Medicare Other

## 2015-07-24 DIAGNOSIS — I5022 Chronic systolic (congestive) heart failure: Secondary | ICD-10-CM | POA: Diagnosis not present

## 2015-07-31 ENCOUNTER — Telehealth: Payer: Self-pay | Admitting: *Deleted

## 2015-07-31 NOTE — Telephone Encounter (Signed)
Pt aware, routed to pcp 

## 2015-07-31 NOTE — Telephone Encounter (Signed)
-----   Message from Arnoldo Lenis, MD sent at 07/28/2015  2:36 PM EST ----- Echo findings are stable, heart pumping function remains mildly decreased. Will continue current meds  Zandra Abts MD

## 2015-09-29 ENCOUNTER — Ambulatory Visit: Payer: Self-pay | Admitting: Cardiology

## 2015-10-24 ENCOUNTER — Encounter: Payer: Self-pay | Admitting: *Deleted

## 2015-10-24 ENCOUNTER — Encounter: Payer: Self-pay | Admitting: Cardiology

## 2015-10-24 ENCOUNTER — Other Ambulatory Visit: Payer: Self-pay | Admitting: Cardiology

## 2015-10-24 ENCOUNTER — Ambulatory Visit (INDEPENDENT_AMBULATORY_CARE_PROVIDER_SITE_OTHER): Payer: Medicare Other | Admitting: Cardiology

## 2015-10-24 VITALS — BP 118/78 | HR 72 | Ht 65.0 in | Wt 229.0 lb

## 2015-10-24 DIAGNOSIS — R079 Chest pain, unspecified: Secondary | ICD-10-CM

## 2015-10-24 NOTE — Patient Instructions (Signed)
Your physician recommends that you schedule a follow-up appointment in: 4 months with Dr. Branch   Your physician recommends that you continue on your current medications as directed. Please refer to the Current Medication list given to you today.  Thank you for choosing [facility]!!    

## 2015-10-24 NOTE — Progress Notes (Signed)
Patient ID: [name], female   DOB: 08-22-1951, 65 y.o.   MRN: [id number]     Clinical Summary [name] is a 65 y.o.female seen today for follow up of the following meidcal problems.   1. Chronic combined systolicand diastolic heart failure - echo 03/2014 LVEF 25-30%, restrictive diastolic dysfunction. New diagnosis at that time. Repeat echo 06/2014 LVEF 40-45% - cath 03/2014 showed RCA 99% with left to right collaterals, other arteries patent. Overall LV systolic dysfunction out of proportion to CAD.  - 07/2015 echo LVEF [id number], grade I diastolic dysfunction   - notes some mild increase in SOB/DOE, example walking at [facility]. No LE edema. No orthopnea. Weight is up 15 lbs since 06/2015. Has not been checking weights at home.  - compliant with meds. Not limiting sodium intake.    2. CAD - cath 03/2014 with 99% chronic RCA disease, otherwise patent vessels. Managed medically   - isolated episode of mild chest pressure 1 week ago. 2/10 in severity, no other assocaited symptoms. Lasted 20-30 minutes. Not positional, no related to meal. No other episodes.   3. OSA screen + snoring, + witnessed apneic episodes. + daytime somnolence - awaiting sleep study with Dr Luan Pulling  4. Hyperlipidemia - compliant with statin Past Medical History  Diagnosis Date  . Diabetes mellitus   . Hypertension   . Arthritis   . TTP (thrombotic thrombocytopenic purpura) (HCC)   . Erosive esophagitis   . Gastritis   . Anemia   . Thyroid disease   . Duodenal ulcer     Age 48  . Obese   . Short-term memory loss   . Chronic systolic heart failure ([place])     a. ECHO (03/2014): EF 25-30%, akinesis enteroanteroseptal myocardium, grade III DD b. RHC (03/2014) RA 4, RV 42/4, PA 44/14 (26), PCWP 21, PA 62% Fick CO/CI 6.9 / 3.4  . Ischemic cardiomyopathy   . CAD (coronary artery disease)     a. LHC (03/2014) Lmain: nl, LAD: diff dz proximal 20%, 30-40% dz mid vessel prior 2nd diagonal, LCx: 40% in OM1, 20-30% in  OM2, RCA: 99% subtotal occlusion @ crux, TIMI 1 flow, L-R collaterals to distal vessel     Allergies  Allergen Reactions  . Other Other (See Comments)    FLAGYL, CIPRO, PROTONIX taken at the same time caused patient to lose her breath and have trouble breathing, requiring hospital stay at [facility], 03/23/14-per patient.      Current Outpatient Prescriptions  Medication Sig Dispense Refill  . acetaminophen (TYLENOL) 500 MG tablet Take 1,000 mg by mouth every 6 (six) hours as needed for moderate pain.     Marland Kitchen aspirin 81 MG chewable tablet Chew 1 tablet (81 mg total) by mouth daily.    Marland Kitchen atorvastatin (LIPITOR) 80 MG tablet Take 1 tablet (80 mg total) by mouth daily at 6 PM. 30 tablet 0  . carvedilol (COREG) 12.5 MG tablet Take 1 tablet (12.5 mg total) by mouth 2 (two) times daily with a meal. 180 tablet 3  . furosemide (LASIX) 40 MG tablet Take 1 tablet (40 mg total) by mouth daily. 90 tablet 3  . hydrocortisone cream 0.5 % Apply 1 application topically 2 (two) times daily as needed for itching.    . insulin glargine (LANTUS) 100 UNIT/ML injection Inject 40 Units into the skin 2 (two) times daily.    Marland Kitchen lisinopril (PRINIVIL,ZESTRIL) 2.5 MG tablet Take 1 tablet (2.5 mg total) by mouth 2 (two) times daily. 60 tablet 3  .  metoCLOPramide (REGLAN) 5 MG tablet Take 1 tablet (5 mg total) by mouth 4 (four) times daily -  before meals and at bedtime. 120 tablet 5  . NOVOLOG 100 UNIT/ML injection Inject 10 Units into the skin 3 (three) times daily with meals.     . polyethylene glycol (MIRALAX / GLYCOLAX) packet Take 17 g by mouth daily.    . ranitidine (ZANTAC) 150 MG tablet Take 150 mg by mouth 2 (two) times daily.    Marland Kitchen spironolactone (ALDACTONE) 25 MG tablet TAKE 1 TABLET BY MOUTH DAILY. 90 tablet 1   No current facility-administered medications for this visit.     Past Surgical History  Procedure Laterality Date  . Splenectomy, partial    . Ectopic pregnancy surgery    . Esophagogastroduodenoscopy   04/20/2012    [id number] antral gastritis/Erosive reflux esophagitis  . Colonoscopy  08/29/2012    [id number] diverticulosis  . Left and right heart catheterization with coronary angiogram N/A 03/28/2014    Procedure: LEFT AND RIGHT HEART CATHETERIZATION WITH CORONARY ANGIOGRAM;  Surgeon: Peter M Martinique, MD;  Location: [facility] CATH LAB;  Service: Cardiovascular;  Laterality: N/A;  . Colonoscopy N/A 05/28/2015    Procedure: COLONOSCOPY;  Surgeon: Daneil Dolin, MD;  Location[facility];  Service: Endoscopy;  Laterality: N/A;  1015     Allergies  Allergen Reactions  . Other Other (See Comments)    FLAGYL, CIPRO, PROTONIX taken at the same time caused patient to lose her breath and have trouble breathing, requiring hospital stay at [facility], 03/23/14-per patient.       Family History  Problem Relation Age of Onset  . Heart failure Mother   . Cirrhosis Father     ETOH  . Colon cancer Neg Hx   . Diabetes Daughter      Social History [name] reports that she quit smoking about 37 years ago. Her smoking use included Cigarettes. She started smoking about 49 years ago. She has a 2.5 pack-year smoking history. She has never used smokeless tobacco. [name] reports that she does not drink alcohol.   Review of Systems CONSTITUTIONAL: No weight loss, fever, chills, weakness or fatigue.  HEENT: Eyes: No visual loss, blurred vision, double vision or yellow sclerae.No hearing loss, sneezing, congestion, runny nose or sore throat.  SKIN: No rash or itching.  CARDIOVASCULAR: per hpi RESPIRATORY: No shortness of breath, cough or sputum.  GASTROINTESTINAL: No anorexia, nausea, vomiting or diarrhea. No abdominal pain or blood.  GENITOURINARY: No burning on urination, no polyuria NEUROLOGICAL: No headache, dizziness, syncope, paralysis, ataxia, numbness or tingling in the extremities. No change in bowel or bladder control.  MUSCULOSKELETAL: No muscle, back pain, joint pain or stiffness.  LYMPHATICS:  No enlarged nodes. No history of splenectomy.  PSYCHIATRIC: No history of depression or anxiety.  ENDOCRINOLOGIC: No reports of sweating, cold or heat intolerance. No polyuria or polydipsia.  Marland Kitchen   Physical Examination Filed Vitals:   10/24/15 1029  BP: 118/78  Pulse: 72   Filed Vitals:   10/24/15 1029  Height: 5\' 5"  (1.651 m)  Weight: 229 lb (103.874 kg)    Gen: resting comfortably, no acute distress HEENT: no scleral icterus, pupils equal round and reactive, no palptable cervical adenopathy,  CV: RRR, no m/r/g, no jvd Resp: Clear to auscultation bilaterally GI: abdomen is soft, non-tender, non-distended, normal bowel sounds, no hepatosplenomegaly MSK: extremities are warm, no edema.  Skin: warm, no rash Neuro:  no focal deficits Psych: appropriate affect  Diagnostic Studies  03/2014 echo Study Conclusions  - Left ventricle: The cavity size was normal. There was mild focal basal hypertrophy of the septum. Systolic function was severely reduced. The estimated ejection fraction was in the range of 25% to 30%. There is akinesis of the entireanteroseptal myocardium. Doppler parameters are consistent with a reversible restrictive pattern, indicative of decreased left ventricular diastolic compliance and/or increased left atrial pressure (grade 3 diastolic dysfunction). - Mitral valve: There was mild regurgitation. - Left atrium: The atrium was moderately dilated.  03/2014 Cath Procedural Findings:  Hemodynamics  RA 8/0 mean 4 mm Hg  RV 42/4 mm Hg  PA 44/14 mean 26 mm Hg  PCWP 20/28 mean 21 mm Hg  LV 98/18 mm Hg  AO 95/54 mean 72 mm Hg  Oxygen saturations:  PA 62%  AO 90%  Cardiac Output (Fick) 6.9 L/min  Cardiac Index (Fick) 3.4 L/min/meter squared  Coronary angiography:  Coronary dominance: right  Left mainstem: Normal  Left anterior descending (LAD): Diffuse disease in the proximal vessel up to 20%. 30-40% disease in the mid vessel prior to the  second diagonal.  Left circumflex (LCx): 40% in OM1. 20-30% in OM2. Otherwise no significant disease.  Right coronary artery (RCA): 99% subtotal occlusion at the crux. TIMI 1 flow. Left to right collaterals to the distal vessel.  Left ventriculography: Left ventricular systolic function is abnormal, LVEF is estimated at 25%. There is inferior akinesis and severe global hypokinesis. There is mild mitral regurgitation  Final Conclusions:  1. Single vessel obstructive CAD with subtotal occlusion of the RCA at the crux.  2. Severe LV dysfunction.  3. Normal Right heart pressures. Elevated PCWP.  Recommendations: Medical management. Her degree of LV dysfunction is out of proportion to her CAD. She has no anginal symptoms so PCI of RCA not indicated. The inferior wall appears scarred.     Assessment and Plan   1. Chronic combined systolic/diastolic HF - NICM, LVEF [id number] now up to 40-45%, NYHA II. Appears euvolemic - continue current meds  2. CAD - medically managed single vessel RCA disease - isolated episode of mild chest pain that was not exertional, will continue to monitor at this time, continue current meds. EKG in clinic without ischemic changes.   3. OSA screen - awaiting sleep study, has upcoming appoitnment with Dr Luan Pulling  4. Hyperlipidemia - request pcp labs, continue statin  F/u 4 months Arnoldo Lenis, M.D.

## 2015-12-16 ENCOUNTER — Encounter (HOSPITAL_COMMUNITY): Payer: Self-pay

## 2015-12-29 ENCOUNTER — Other Ambulatory Visit: Payer: Self-pay | Admitting: Cardiology

## 2016-02-24 DIAGNOSIS — R05 Cough: Secondary | ICD-10-CM | POA: Diagnosis not present

## 2016-02-24 DIAGNOSIS — E1143 Type 2 diabetes mellitus with diabetic autonomic (poly)neuropathy: Secondary | ICD-10-CM | POA: Diagnosis not present

## 2016-02-24 DIAGNOSIS — I10 Essential (primary) hypertension: Secondary | ICD-10-CM | POA: Diagnosis not present

## 2016-02-24 DIAGNOSIS — I110 Hypertensive heart disease with heart failure: Secondary | ICD-10-CM | POA: Diagnosis not present

## 2016-03-01 DIAGNOSIS — I10 Essential (primary) hypertension: Secondary | ICD-10-CM | POA: Diagnosis not present

## 2016-03-01 DIAGNOSIS — E1143 Type 2 diabetes mellitus with diabetic autonomic (poly)neuropathy: Secondary | ICD-10-CM | POA: Diagnosis not present

## 2016-03-01 DIAGNOSIS — I110 Hypertensive heart disease with heart failure: Secondary | ICD-10-CM | POA: Diagnosis not present

## 2016-03-01 DIAGNOSIS — R05 Cough: Secondary | ICD-10-CM | POA: Diagnosis not present

## 2016-03-04 ENCOUNTER — Ambulatory Visit (INDEPENDENT_AMBULATORY_CARE_PROVIDER_SITE_OTHER): Payer: Commercial Managed Care - HMO | Admitting: Cardiology

## 2016-03-04 ENCOUNTER — Encounter: Payer: Self-pay | Admitting: *Deleted

## 2016-03-04 ENCOUNTER — Encounter: Payer: Self-pay | Admitting: Cardiology

## 2016-03-04 VITALS — BP 116/76 | HR 85 | Ht 65.0 in | Wt 232.0 lb

## 2016-03-04 DIAGNOSIS — E785 Hyperlipidemia, unspecified: Secondary | ICD-10-CM

## 2016-03-04 DIAGNOSIS — I2510 Atherosclerotic heart disease of native coronary artery without angina pectoris: Secondary | ICD-10-CM

## 2016-03-04 DIAGNOSIS — I5022 Chronic systolic (congestive) heart failure: Secondary | ICD-10-CM | POA: Diagnosis not present

## 2016-03-04 DIAGNOSIS — G4730 Sleep apnea, unspecified: Secondary | ICD-10-CM | POA: Diagnosis not present

## 2016-03-04 MED ORDER — CARVEDILOL 25 MG PO TABS: 25.0000 mg | ORAL_TABLET | Freq: Two times a day (BID) | ORAL | Status: DC

## 2016-03-04 NOTE — Patient Instructions (Signed)
Your physician recommends that you schedule a follow-up appointment in: 3 MONTHS WITH DR. Washington  Your physician has recommended you make the following change in your medication:   INCREASE CARVEDILOL 25 MG TWICE DAILY  You have been referred to DR. HAWKINS   Thank you for choosing [facility]!!

## 2016-03-04 NOTE — Progress Notes (Signed)
Clinical Summary [name] is a 65 y.o.female seen today for follow up of the following meidcal problems.   1. Chronic combined systolicand diastolic heart failure - echo 03/2014 LVEF 25-30%, restrictive diastolic dysfunction. New diagnosis at that time. Repeat echo 06/2014 LVEF 40-45% - cath 03/2014 showed RCA 99% with left to right collaterals, other arteries patent. Overall LV systolic dysfunction out of proportion to CAD. Medically managed.  - 07/2015 echo LVEF [id number], grade I diastolic dysfunction   - recent weight gain, Oct 2016 wt 214. No up to 232 lbs. No significant LE edema.  - occasional SOB that is overall stable.  - working to limit sodium. Avoiding NSAIDs - compliant with meds    2. CAD - cath 03/2014 with 99% chronic RCA disease, otherwise patent vessels. Managed medically  - no recent chest pain  3. OSA screen + snoring, + witnessed apneic episodes. + daytime somnolence - awaiting sleep study with Dr Luan Pulling  4. Hyperlipidemia - compliant with statin Past Medical History  Diagnosis Date  . Diabetes mellitus   . Hypertension   . Arthritis   . TTP (thrombotic thrombocytopenic purpura) (HCC)   . Erosive esophagitis   . Gastritis   . Anemia   . Thyroid disease   . Duodenal ulcer     Age 48  . Obese   . Short-term memory loss   . Chronic systolic heart failure ([place])     a. ECHO (03/2014): EF 25-30%, akinesis enteroanteroseptal myocardium, grade III DD b. RHC (03/2014) RA 4, RV 42/4, PA 44/14 (26), PCWP 21, PA 62% Fick CO/CI 6.9 / 3.4  . Ischemic cardiomyopathy   . CAD (coronary artery disease)     a. LHC (03/2014) Lmain: nl, LAD: diff dz proximal 20%, 30-40% dz mid vessel prior 2nd diagonal, LCx: 40% in OM1, 20-30% in OM2, RCA: 99% subtotal occlusion @ crux, TIMI 1 flow, L-R collaterals to distal vessel     Allergies  Allergen Reactions  . Other Other (See Comments)    FLAGYL, CIPRO, PROTONIX taken at the same time caused patient to lose her breath  and have trouble breathing, requiring hospital stay at [facility], 03/23/14-per patient.      Current Outpatient Prescriptions  Medication Sig Dispense Refill  . acetaminophen (TYLENOL) 500 MG tablet Take 1,000 mg by mouth every 6 (six) hours as needed for moderate pain.     Marland Kitchen aspirin 81 MG chewable tablet Chew 1 tablet (81 mg total) by mouth daily.    Marland Kitchen atorvastatin (LIPITOR) 80 MG tablet Take 1 tablet (80 mg total) by mouth daily at 6 PM. 30 tablet 0  . carvedilol (COREG) 12.5 MG tablet TAKE ONE TABLET BY MOUTH TWICE DAILY. 180 tablet 3  . furosemide (LASIX) 40 MG tablet TAKE ONE TABLET BY MOUTH DAILY. 90 tablet 3  . hydrocortisone cream 0.5 % Apply 1 application topically 2 (two) times daily as needed for itching.    . insulin glargine (LANTUS) 100 UNIT/ML injection Inject 40 Units into the skin 2 (two) times daily.    Marland Kitchen lisinopril (PRINIVIL,ZESTRIL) 2.5 MG tablet Take 1 tablet (2.5 mg total) by mouth 2 (two) times daily. 60 tablet 3  . metoCLOPramide (REGLAN) 5 MG tablet Take 1 tablet (5 mg total) by mouth 4 (four) times daily -  before meals and at bedtime. 120 tablet 5  . NOVOLOG 100 UNIT/ML injection Inject 10 Units into the skin 3 (three) times daily with meals.     . polyethylene  glycol (MIRALAX / GLYCOLAX) packet Take 17 g by mouth daily.    . ranitidine (ZANTAC) 150 MG tablet Take 150 mg by mouth 2 (two) times daily.    Marland Kitchen spironolactone (ALDACTONE) 25 MG tablet TAKE 1 TABLET BY MOUTH DAILY. 90 tablet 1   No current facility-administered medications for this visit.     Past Surgical History  Procedure Laterality Date  . Splenectomy, partial    . Ectopic pregnancy surgery    . Esophagogastroduodenoscopy  04/20/2012    [id number] antral gastritis/Erosive reflux esophagitis  . Colonoscopy  08/29/2012    [id number] diverticulosis  . Left and right heart catheterization with coronary angiogram N/A 03/28/2014    Procedure: LEFT AND RIGHT HEART CATHETERIZATION WITH CORONARY ANGIOGRAM;   Surgeon: Peter M Martinique, MD;  Location: [facility] CATH LAB;  Service: Cardiovascular;  Laterality: N/A;  . Colonoscopy N/A 05/28/2015    Procedure: COLONOSCOPY;  Surgeon: Daneil Dolin, MD;  Location: [facility] ENDO SUITE;  Service: Endoscopy;  Laterality: N/A;  1015     Allergies  Allergen Reactions  . Other Other (See Comments)    FLAGYL, CIPRO, PROTONIX taken at the same time caused patient to lose her breath and have trouble breathing, requiring hospital stay at [facility], 03/23/14-per patient.       Family History  Problem Relation Age of Onset  . Heart failure Mother   . Cirrhosis Father     ETOH  . Colon cancer Neg Hx   . Diabetes Daughter      Social History [name] reports that she quit smoking about 37 years ago. Her smoking use included Cigarettes. She started smoking about 49 years ago. She has a 2.5 pack-year smoking history. She has never used smokeless tobacco. [name] reports that she does not drink alcohol.   Review of Systems CONSTITUTIONAL: No weight loss, fever, chills, weakness or fatigue.  HEENT: Eyes: No visual loss, blurred vision, double vision or yellow sclerae.No hearing loss, sneezing, congestion, runny nose or sore throat.  SKIN: No rash or itching.  CARDIOVASCULAR: per HPI RESPIRATORY: No shortness of breath, cough or sputum.  GASTROINTESTINAL: No anorexia, nausea, vomiting or diarrhea. No abdominal pain or blood.  GENITOURINARY: No burning on urination, no polyuria NEUROLOGICAL: No headache, dizziness, syncope, paralysis, ataxia, numbness or tingling in the extremities. No change in bowel or bladder control.  MUSCULOSKELETAL: No muscle, back pain, joint pain or stiffness.  LYMPHATICS: No enlarged nodes. No history of splenectomy.  PSYCHIATRIC: No history of depression or anxiety.  ENDOCRINOLOGIC: No reports of sweating, cold or heat intolerance. No polyuria or polydipsia.  Marland Kitchen   Physical Examination Filed Vitals:   03/04/16 0914  BP: 116/76  Pulse: 85    Filed Vitals:   03/04/16 0914  Height: 5\' 5"  (1.651 m)  Weight: 232 lb (105.235 kg)    Gen: resting comfortably, no acute distress HEENT: no scleral icterus, pupils equal round and reactive, no palptable cervical adenopathy,  CV: RRR, no m/r/g,no jvd Resp: Clear to auscultation bilaterally GI: abdomen is soft, non-tender, non-distended, normal bowel sounds, no hepatosplenomegaly MSK: extremities are warm, no edema.  Skin: warm, no rash Neuro:  no focal deficits Psych: appropriate affect   Diagnostic Studies 03/2014 echo Study Conclusions  - Left ventricle: The cavity size was normal. There was mild focal basal hypertrophy of the septum. Systolic function was severely reduced. The estimated ejection fraction was in the range of 25% to 30%. There is akinesis of the entireanteroseptal myocardium. Doppler parameters are consistent  with a reversible restrictive pattern, indicative of decreased left ventricular diastolic compliance and/or increased left atrial pressure (grade 3 diastolic dysfunction). - Mitral valve: There was mild regurgitation. - Left atrium: The atrium was moderately dilated.  03/2014 Cath Procedural Findings:  Hemodynamics  RA 8/0 mean 4 mm Hg  RV 42/4 mm Hg  PA 44/14 mean 26 mm Hg  PCWP 20/28 mean 21 mm Hg  LV 98/18 mm Hg  AO 95/54 mean 72 mm Hg  Oxygen saturations:  PA 62%  AO 90%  Cardiac Output (Fick) 6.9 L/min  Cardiac Index (Fick) 3.4 L/min/meter squared  Coronary angiography:  Coronary dominance: right  Left mainstem: Normal  Left anterior descending (LAD): Diffuse disease in the proximal vessel up to 20%. 30-40% disease in the mid vessel prior to the second diagonal.  Left circumflex (LCx): 40% in OM1. 20-30% in OM2. Otherwise no significant disease.  Right coronary artery (RCA): 99% subtotal occlusion at the crux. TIMI 1 flow. Left to right collaterals to the distal vessel.  Left ventriculography: Left ventricular  systolic function is abnormal, LVEF is estimated at 25%. There is inferior akinesis and severe global hypokinesis. There is mild mitral regurgitation  Final Conclusions:  1. Single vessel obstructive CAD with subtotal occlusion of the RCA at the crux.  2. Severe LV dysfunction.  3. Normal Right heart pressures. Elevated PCWP.  Recommendations: Medical management. Her degree of LV dysfunction is out of proportion to her CAD. She has no anginal symptoms so PCI of RCA not indicated. The inferior wall appears scarred.    Assessment and Plan   1. Chronic combined systolic/diastolic HF - NICM, LVEF 123XX123 now up to 40-45%, NYHA II. - recent weight gain does not eapper to be fluid related, counseled on diet and exercise - we will increase coreg to 25mg  bid.   2. CAD - medically managed single vessel RCA disease - no recent symptoms. Conitnue current meds  3. OSA screen - awaiting sleep study, has upcoming appoitnment with Dr Luan Pulling  4. Hyperlipidemia - continue statin    F/u 3 months  Arnoldo Lenis, M.D.

## 2016-03-15 ENCOUNTER — Other Ambulatory Visit: Payer: Self-pay | Admitting: Internal Medicine

## 2016-04-12 ENCOUNTER — Other Ambulatory Visit: Payer: Self-pay | Admitting: *Deleted

## 2016-04-12 MED ORDER — ATORVASTATIN CALCIUM 80 MG PO TABS: 80.0000 mg | ORAL_TABLET | Freq: Every day | ORAL | 3 refills | Status: DC

## 2016-04-12 MED ORDER — LOSARTAN POTASSIUM 50 MG PO TABS: 50.0000 mg | ORAL_TABLET | Freq: Every day | ORAL | 3 refills | Status: DC

## 2016-04-12 MED ORDER — SPIRONOLACTONE 25 MG PO TABS: 25.0000 mg | ORAL_TABLET | Freq: Every day | ORAL | 3 refills | Status: DC

## 2016-04-12 MED ORDER — CARVEDILOL 25 MG PO TABS: 25.0000 mg | ORAL_TABLET | Freq: Two times a day (BID) | ORAL | 3 refills | Status: DC

## 2016-04-20 ENCOUNTER — Other Ambulatory Visit: Payer: Self-pay | Admitting: Cardiology

## 2016-04-20 DIAGNOSIS — I5042 Chronic combined systolic (congestive) and diastolic (congestive) heart failure: Secondary | ICD-10-CM | POA: Diagnosis not present

## 2016-04-20 DIAGNOSIS — G4730 Sleep apnea, unspecified: Secondary | ICD-10-CM | POA: Diagnosis not present

## 2016-04-20 DIAGNOSIS — E1143 Type 2 diabetes mellitus with diabetic autonomic (poly)neuropathy: Secondary | ICD-10-CM | POA: Diagnosis not present

## 2016-04-20 DIAGNOSIS — I10 Essential (primary) hypertension: Secondary | ICD-10-CM | POA: Diagnosis not present

## 2016-04-20 MED ORDER — ATORVASTATIN CALCIUM 80 MG PO TABS: 80.0000 mg | ORAL_TABLET | Freq: Every day | ORAL | 3 refills | Status: DC

## 2016-04-20 NOTE — Telephone Encounter (Signed)
Done

## 2016-04-20 NOTE — Telephone Encounter (Signed)
Refill:  (LIPITOR) 80 MG tablet   please send to [place] order

## 2016-04-22 ENCOUNTER — Other Ambulatory Visit (HOSPITAL_COMMUNITY): Payer: Self-pay | Admitting: Respiratory Therapy

## 2016-04-22 DIAGNOSIS — G4730 Sleep apnea, unspecified: Secondary | ICD-10-CM

## 2016-05-04 ENCOUNTER — Ambulatory Visit: Payer: Commercial Managed Care - HMO | Attending: Pulmonary Disease | Admitting: Neurology

## 2016-05-04 DIAGNOSIS — G4733 Obstructive sleep apnea (adult) (pediatric): Secondary | ICD-10-CM | POA: Insufficient documentation

## 2016-05-04 DIAGNOSIS — G4730 Sleep apnea, unspecified: Secondary | ICD-10-CM

## 2016-05-16 NOTE — Procedures (Signed)
[place] A. Merlene Laughter, MD     [email or web address]             NOCTURNAL POLYSOMNOGRAPHY   LOCATION: [facility]    Gender: Female D.O.B: 05/06/1951 Age (years): 65 Referring Provider: Lennox Solders Height (inches): 65 Interpreting Physician: Phillips Odor MD, ABSM Weight (lbs): 230 RPSGT: Rosebud Poles BMI: 38 MRN: [id number] Neck Size: 18.00 CLINICAL INFORMATION Sleep Study Type: NPSG Indication for sleep study: OSA Epworth Sleepiness Score: 4 SLEEP STUDY TECHNIQUE As per the AASM Manual for the Scoring of Sleep and Associated Events v2.3 (April 2016) with a hypopnea requiring 4% desaturations. The channels recorded and monitored were frontal, central and occipital EEG, electrooculogram (EOG), submentalis EMG (chin), nasal and oral airflow, thoracic and abdominal wall motion, anterior tibialis EMG, snore microphone, electrocardiogram, and pulse oximetry. MEDICATIONS Patient's medications include: N/A. Medications self-administered by patient during sleep study : No sleep medicine administered.  Current Outpatient Prescriptions:  .  acetaminophen (TYLENOL) 500 MG tablet, Take 1,000 mg by mouth every 6 (six) hours as needed for moderate pain. , Disp: , Rfl:  .  aspirin 81 MG chewable tablet, Chew 1 tablet (81 mg total) by mouth daily., Disp: , Rfl:  .  atorvastatin (LIPITOR) 80 MG tablet, Take 1 tablet (80 mg total) by mouth daily at 6 PM., Disp: 90 tablet, Rfl: 3 .  carvedilol (COREG) 25 MG tablet, Take 1 tablet (25 mg total) by mouth 2 (two) times daily., Disp: 180 tablet, Rfl: 3 .  furosemide (LASIX) 40 MG tablet, TAKE ONE TABLET BY MOUTH DAILY., Disp: 90 tablet, Rfl: 3 .  insulin glargine (LANTUS) 100 UNIT/ML injection, Inject 40 Units into the skin 2 (two) times daily., Disp: , Rfl:  .  losartan (COZAAR) 50 MG tablet, Take 1 tablet (50 mg total) by mouth daily., Disp: 90 tablet, Rfl: 3 .  metoCLOPramide (REGLAN) 5 MG tablet, Take 1 tablet (5 mg total)  by mouth 4 (four) times daily -  before meals and at bedtime., Disp: 120 tablet, Rfl: 5 .  Multiple Vitamin (MULTIVITAMIN) tablet, Take 1 tablet by mouth daily., Disp: , Rfl:  .  NOVOLOG 100 UNIT/ML injection, Inject 10 Units into the skin 3 (three) times daily with meals. , Disp: , Rfl:  .  polyethylene glycol (MIRALAX / GLYCOLAX) packet, Take 17 g by mouth daily., Disp: , Rfl:  .  spironolactone (ALDACTONE) 25 MG tablet, Take 1 tablet (25 mg total) by mouth daily., Disp: 90 tablet, Rfl: 3  SLEEP ARCHITECTURE The study was initiated at 11:10:47 PM and ended at 5:27:36 AM. Sleep onset time was 87.2 minutes and the sleep efficiency was 73.1%. The total sleep time was 275.6 minutes. Stage REM latency was 102.5 minutes. The patient spent 1.45% of the night in stage N1 sleep, 55.37% in stage N2 sleep, 14.88% in stage N3 and 28.30% in REM. Alpha intrusion was absent. Supine sleep was 26.85%. RESPIRATORY PARAMETERS The overall apnea/hypopnea index (AHI) was 49.0 per hour. There were 42 total apneas, including 39 obstructive, 3 central and 0 mixed apneas. There were 183 hypopneas and 0 RERAs. The AHI during Stage REM sleep was 83.1 per hour. AHI while supine was 34.1 per hour. The mean oxygen saturation was 93.48%. The minimum SpO2 during sleep was 0.00%. Loud snoring was noted during this study. CARDIAC DATA The 2 lead EKG demonstrated sinus rhythm. The mean heart rate was N/A beats per minute. Other EKG findings include: PVCs, Atrial Flutter. LEG MOVEMENT DATA The total PLMS were 0  with a resulting PLMS index of 0.00. Associated arousal with leg movement index was 0.0.    IMPRESSIONS - Severe obstructive sleep apnea. Suggest AutoPAP 8-15.    Delano Metz, MD Diplomate, American Board of Sleep Medicine.

## 2016-06-15 ENCOUNTER — Encounter: Payer: Self-pay | Admitting: *Deleted

## 2016-06-16 ENCOUNTER — Encounter: Payer: Self-pay | Admitting: *Deleted

## 2016-06-16 ENCOUNTER — Encounter: Payer: Self-pay | Admitting: Cardiology

## 2016-06-16 ENCOUNTER — Ambulatory Visit (INDEPENDENT_AMBULATORY_CARE_PROVIDER_SITE_OTHER): Payer: Commercial Managed Care - HMO | Admitting: Cardiology

## 2016-06-16 VITALS — BP 112/67 | HR 88 | Ht 65.0 in | Wt 238.0 lb

## 2016-06-16 DIAGNOSIS — I5042 Chronic combined systolic (congestive) and diastolic (congestive) heart failure: Secondary | ICD-10-CM

## 2016-06-16 DIAGNOSIS — IMO0002 Reserved for concepts with insufficient information to code with codable children: Secondary | ICD-10-CM

## 2016-06-16 DIAGNOSIS — E785 Hyperlipidemia, unspecified: Secondary | ICD-10-CM

## 2016-06-16 DIAGNOSIS — E1365 Other specified diabetes mellitus with hyperglycemia: Secondary | ICD-10-CM

## 2016-06-16 DIAGNOSIS — I2510 Atherosclerotic heart disease of native coronary artery without angina pectoris: Secondary | ICD-10-CM | POA: Diagnosis not present

## 2016-06-16 DIAGNOSIS — G4730 Sleep apnea, unspecified: Secondary | ICD-10-CM

## 2016-06-16 MED ORDER — SACUBITRIL-VALSARTAN 49-51 MG PO TABS: 1.0000 | ORAL_TABLET | Freq: Two times a day (BID) | ORAL | 3 refills | Status: DC

## 2016-06-16 MED ORDER — SACUBITRIL-VALSARTAN 49-51 MG PO TABS: 1.0000 | ORAL_TABLET | Freq: Two times a day (BID) | ORAL | 0 refills | Status: DC

## 2016-06-16 NOTE — Progress Notes (Signed)
Clinical Summary [name] is a 65 y.o.female seen today for follow up of the following meidcal problems.   1. Chronic combined systolicand diastolic heart failure - echo 03/2014 LVEF 25-30%, restrictive diastolic dysfunction. New diagnosis at that time. Repeat echo 06/2014 LVEF 40-45% - cath 03/2014 showed RCA 99% with left to right collaterals, other arteries patent. Overall LV systolic dysfunction out of proportion to CAD. Medically managed.  - 07/2015 echo LVEF [id number], grade I diastolic dysfunction   - stable SOB. DOE at 1/2 block which is stable. Example walking to her grandsons football games gets SOB. Sedentary lifestyle. No recent LE edema - discussed cardiac rehab, she is going to think over.  - compliant with meds. Limiting sodium, avoiding NSAIDs   2. CAD - cath 03/2014 with 99% chronic RCA disease, otherwise patent vessels. Managed medically - no recent chest pain  3. OSA  - severe OSA by sleep study 04/2016 - she is awaiting to see Dr Luan Pulling to discuss CPAP  4. Hyperlipidemia - compliant with statin 02/2016: TC 84 TG 79 HDL 40 LDL 28   Past Medical History:  Diagnosis Date  . Anemia   . Arthritis   . CAD (coronary artery disease)    a. LHC (03/2014) Lmain: nl, LAD: diff dz proximal 20%, 30-40% dz mid vessel prior 2nd diagonal, LCx: 40% in OM1, 20-30% in OM2, RCA: 99% subtotal occlusion @ crux, TIMI 1 flow, L-R collaterals to distal vessel  . Chronic systolic heart failure ([place])    a. ECHO (03/2014): EF 25-30%, akinesis enteroanteroseptal myocardium, grade III DD b. RHC (03/2014) RA 4, RV 42/4, PA 44/14 (26), PCWP 21, PA 62% Fick CO/CI 6.9 / 3.4  . Diabetes mellitus   . Duodenal ulcer    Age 19  . Erosive esophagitis   . Gastritis   . Hypertension   . Ischemic cardiomyopathy   . Obese   . Short-term memory loss   . Thyroid disease   . TTP (thrombotic thrombocytopenic purpura) (HCC)      Allergies  Allergen Reactions  . Other Other (See Comments)      FLAGYL, CIPRO, PROTONIX taken at the same time caused patient to lose her breath and have trouble breathing, requiring hospital stay at [facility], 03/23/14-per patient.   . Lisinopril Cough     Current Outpatient Prescriptions  Medication Sig Dispense Refill  . acetaminophen (TYLENOL) 500 MG tablet Take 1,000 mg by mouth every 6 (six) hours as needed for moderate pain.     Marland Kitchen aspirin 81 MG chewable tablet Chew 1 tablet (81 mg total) by mouth daily.    Marland Kitchen atorvastatin (LIPITOR) 80 MG tablet Take 1 tablet (80 mg total) by mouth daily at 6 PM. 90 tablet 3  . carvedilol (COREG) 25 MG tablet Take 1 tablet (25 mg total) by mouth 2 (two) times daily. 180 tablet 3  . furosemide (LASIX) 40 MG tablet TAKE ONE TABLET BY MOUTH DAILY. 90 tablet 3  . insulin glargine (LANTUS) 100 UNIT/ML injection Inject 40 Units into the skin 2 (two) times daily.    Marland Kitchen losartan (COZAAR) 50 MG tablet Take 1 tablet (50 mg total) by mouth daily. 90 tablet 3  . metoCLOPramide (REGLAN) 5 MG tablet Take 1 tablet (5 mg total) by mouth 4 (four) times daily -  before meals and at bedtime. 120 tablet 5  . Multiple Vitamin (MULTIVITAMIN) tablet Take 1 tablet by mouth daily.    Marland Kitchen NOVOLOG 100 UNIT/ML injection Inject 10  Units into the skin 3 (three) times daily with meals.     . polyethylene glycol (MIRALAX / GLYCOLAX) packet Take 17 g by mouth daily.    Marland Kitchen spironolactone (ALDACTONE) 25 MG tablet Take 1 tablet (25 mg total) by mouth daily. 90 tablet 3   No current facility-administered medications for this visit.      Past Surgical History:  Procedure Laterality Date  . COLONOSCOPY  08/29/2012   [id number] diverticulosis  . COLONOSCOPY N/A 05/28/2015   Procedure: COLONOSCOPY;  Surgeon: Daneil Dolin, MD;  Location: [facility];  Service: Endoscopy;  Laterality: N/A;  1015  . ECTOPIC PREGNANCY SURGERY    . ESOPHAGOGASTRODUODENOSCOPY  04/20/2012   [id number] antral gastritis/Erosive reflux esophagitis  . LEFT AND RIGHT HEART  CATHETERIZATION WITH CORONARY ANGIOGRAM N/A 03/28/2014   Procedure: LEFT AND RIGHT HEART CATHETERIZATION WITH CORONARY ANGIOGRAM;  Surgeon: Peter M Martinique, MD;  Location: [facility] CATH LAB;  Service: Cardiovascular;  Laterality: N/A;  . SPLENECTOMY, PARTIAL       Allergies  Allergen Reactions  . Other Other (See Comments)    FLAGYL, CIPRO, PROTONIX taken at the same time caused patient to lose her breath and have trouble breathing, requiring hospital stay at [facility], 03/23/14-per patient.   . Lisinopril Cough      Family History  Problem Relation Age of Onset  . Heart failure Mother   . Cirrhosis Father     ETOH  . Diabetes Daughter   . Colon cancer Neg Hx      Social History [name] reports that she quit smoking about 37 years ago. Her smoking use included Cigarettes. She started smoking about 49 years ago. She has a 2.50 pack-year smoking history. She has never used smokeless tobacco. [name] reports that she does not drink alcohol.   Review of Systems CONSTITUTIONAL: No weight loss, fever, chills, weakness or fatigue.  HEENT: Eyes: No visual loss, blurred vision, double vision or yellow sclerae.No hearing loss, sneezing, congestion, runny nose or sore throat.  SKIN: No rash or itching.  CARDIOVASCULAR: per hpi RESPIRATORY: No shortness of breath, cough or sputum.  GASTROINTESTINAL: No anorexia, nausea, vomiting or diarrhea. No abdominal pain or blood.  GENITOURINARY: No burning on urination, no polyuria NEUROLOGICAL: No headache, dizziness, syncope, paralysis, ataxia, numbness or tingling in the extremities. No change in bowel or bladder control.  MUSCULOSKELETAL: No muscle, back pain, joint pain or stiffness.  LYMPHATICS: No enlarged nodes. No history of splenectomy.  PSYCHIATRIC: No history of depression or anxiety.  ENDOCRINOLOGIC: No reports of sweating, cold or heat intolerance. No polyuria or polydipsia.  Marland Kitchen   Physical Examination Vitals:   06/16/16 0905  BP: 112/67    Pulse: 88   Vitals:   06/16/16 0905  Weight: 238 lb (108 kg)  Height: 5\' 5"  (1.651 m)    Gen: resting comfortably, no acute distress HEENT: no scleral icterus, pupils equal round and reactive, no palptable cervical adenopathy,  CV: RRR, no m/r/g, no jvd Resp: Clear to auscultation bilaterally GI: abdomen is soft, non-tender, non-distended, normal bowel sounds, no hepatosplenomegaly MSK: extremities are warm, no edema.  Skin: warm, no rash Neuro:  no focal deficits Psych: appropriate affect   Diagnostic Studies 03/2014 echo Study Conclusions  - Left ventricle: The cavity size was normal. There was mild focal basal hypertrophy of the septum. Systolic function was severely reduced. The estimated ejection fraction was in the range of 25% to 30%. There is akinesis of the entireanteroseptal myocardium. Doppler parameters  are consistent with a reversible restrictive pattern, indicative of decreased left ventricular diastolic compliance and/or increased left atrial pressure (grade 3 diastolic dysfunction). - Mitral valve: There was mild regurgitation. - Left atrium: The atrium was moderately dilated.  03/2014 Cath Procedural Findings:  Hemodynamics  RA 8/0 mean 4 mm Hg  RV 42/4 mm Hg  PA 44/14 mean 26 mm Hg  PCWP 20/28 mean 21 mm Hg  LV 98/18 mm Hg  AO 95/54 mean 72 mm Hg  Oxygen saturations:  PA 62%  AO 90%  Cardiac Output (Fick) 6.9 L/min  Cardiac Index (Fick) 3.4 L/min/meter squared  Coronary angiography:  Coronary dominance: right  Left mainstem: Normal  Left anterior descending (LAD): Diffuse disease in the proximal vessel up to 20%. 30-40% disease in the mid vessel prior to the second diagonal.  Left circumflex (LCx): 40% in OM1. 20-30% in OM2. Otherwise no significant disease.  Right coronary artery (RCA): 99% subtotal occlusion at the crux. TIMI 1 flow. Left to right collaterals to the distal vessel.  Left ventriculography: Left ventricular  systolic function is abnormal, LVEF is estimated at 25%. There is inferior akinesis and severe global hypokinesis. There is mild mitral regurgitation  Final Conclusions:  1. Single vessel obstructive CAD with subtotal occlusion of the RCA at the crux.  2. Severe LV dysfunction.  3. Normal Right heart pressures. Elevated PCWP.  Recommendations: Medical management. Her degree of LV dysfunction is out of proportion to her CAD. She has no anginal symptoms so PCI of RCA not indicated. The inferior wall appears scarred.     Assessment and Plan   1. Chronic combined systolic/diastolic HF - NICM, LVEF 123XX123 now up to 40-45%, NYHA II. - we will change losartan to entresto 49/51mg  bid.   2. CAD - medically managed single vessel RCA disease - no recent symptoms. We willonitnue current meds  3. OSA  -she is to see Dr Luan Pulling to discuss CPAP  4. Morbid obesity/DM2 - we will refer to nutrition  5.. Hyperlipidemia -we will continue statin  F/u 2 months   Arnoldo Lenis, M.D.

## 2016-06-16 NOTE — Patient Instructions (Signed)
Your physician recommends that you schedule a follow-up appointment in: 2 MONTHS WITH DR. Powhatan   Your physician has recommended you make the following change in your medication:   STOP LOSARTAN   START ENTRESTO 49/51 MG TWICE DAILY   You have been referred to NUTRITION   Thank you for choosing [facility]!!

## 2016-06-17 NOTE — Progress Notes (Signed)
This encounter was created in error - please disregard.

## 2016-06-21 ENCOUNTER — Telehealth: Payer: Self-pay | Admitting: Cardiology

## 2016-06-21 NOTE — Telephone Encounter (Signed)
Forwarded to Russella Dar that is over cardiac rehab, in hoping she will be better to explain the cardiac rehab and what they do better than I can.

## 2016-06-23 ENCOUNTER — Telehealth: Payer: Self-pay | Admitting: *Deleted

## 2016-06-23 NOTE — Telephone Encounter (Signed)
Patient notified and verbalized understanding.  No weight gain or fluid build up.

## 2016-06-23 NOTE — Telephone Encounter (Signed)
SOB is not a common side effect of entresto. Has she had any weight gain of fluid building up? I would try again to restart entresto later in the week when she is feeling better and see how she feels on it. If SOB again then we can try switching her back   Zandra Abts MD

## 2016-06-23 NOTE — Telephone Encounter (Signed)
Patient c/o SOB, more than usual.  Last seen in office on 06/16/2016 - Dr. Harl Bowie stopped her Losartan & started [facility]  The day after beginning this medication, she felt weakness, dizziness, fatigue.  No c/o chest pain or weight gain.  Stated that she did stop the medication yesterday & does seem to feel a little better today.  Message sent to provider for further advice.

## 2016-06-24 ENCOUNTER — Telehealth: Payer: Self-pay | Admitting: *Deleted

## 2016-06-24 NOTE — Telephone Encounter (Signed)
PA for Entresto approved through 06/22/2018 [facility]

## 2016-07-06 ENCOUNTER — Ambulatory Visit: Payer: Commercial Managed Care - HMO | Admitting: Nutrition

## 2016-07-14 DIAGNOSIS — G4733 Obstructive sleep apnea (adult) (pediatric): Secondary | ICD-10-CM | POA: Diagnosis not present

## 2016-07-14 DIAGNOSIS — E1165 Type 2 diabetes mellitus with hyperglycemia: Secondary | ICD-10-CM | POA: Diagnosis not present

## 2016-07-14 DIAGNOSIS — I5042 Chronic combined systolic (congestive) and diastolic (congestive) heart failure: Secondary | ICD-10-CM | POA: Diagnosis not present

## 2016-07-14 DIAGNOSIS — Z23 Encounter for immunization: Secondary | ICD-10-CM | POA: Diagnosis not present

## 2016-07-14 DIAGNOSIS — I2510 Atherosclerotic heart disease of native coronary artery without angina pectoris: Secondary | ICD-10-CM | POA: Diagnosis not present

## 2016-07-19 ENCOUNTER — Encounter: Payer: Commercial Managed Care - HMO | Attending: Cardiology | Admitting: Nutrition

## 2016-07-19 ENCOUNTER — Encounter: Payer: Self-pay | Admitting: Nutrition

## 2016-07-19 VITALS — Ht 65.0 in | Wt 242.0 lb

## 2016-07-19 DIAGNOSIS — IMO0002 Reserved for concepts with insufficient information to code with codable children: Secondary | ICD-10-CM

## 2016-07-19 DIAGNOSIS — E119 Type 2 diabetes mellitus without complications: Secondary | ICD-10-CM | POA: Diagnosis not present

## 2016-07-19 DIAGNOSIS — Z713 Dietary counseling and surveillance: Secondary | ICD-10-CM | POA: Diagnosis not present

## 2016-07-19 DIAGNOSIS — Z6841 Body Mass Index (BMI) 40.0 and over, adult: Secondary | ICD-10-CM | POA: Insufficient documentation

## 2016-07-19 DIAGNOSIS — I2510 Atherosclerotic heart disease of native coronary artery without angina pectoris: Secondary | ICD-10-CM

## 2016-07-19 DIAGNOSIS — E118 Type 2 diabetes mellitus with unspecified complications: Secondary | ICD-10-CM

## 2016-07-19 DIAGNOSIS — K760 Fatty (change of) liver, not elsewhere classified: Secondary | ICD-10-CM

## 2016-07-19 DIAGNOSIS — Z794 Long term (current) use of insulin: Secondary | ICD-10-CM

## 2016-07-19 DIAGNOSIS — E1165 Type 2 diabetes mellitus with hyperglycemia: Secondary | ICD-10-CM

## 2016-07-19 DIAGNOSIS — E669 Obesity, unspecified: Secondary | ICD-10-CM

## 2016-07-19 NOTE — Progress Notes (Addendum)
Diabetes Self-Management Education  Visit Type: First/Initial  Appt. Start Time: 800 Appt. End Time: 900  07/19/2016  [name], identified by name and date of birth, is a 65 y.o. female with a diagnosis of Diabetes: Type 2  Assessment:  Primary concerns today: Diabetes. CAD, Fatty Liver. Lives by herself.  Referred by her Cardiologist Dr. Harl Bowie.  Has never met with a dietitian before. PCP Dr. Luan Pulling. Lantus 40 units twice a day using insulin vial and syringe. Novolog 10 units TID. Forgets her insuin at night often.  Doesn't feel good today. Has been a little light headed and dizzy. DIdn't eat breakfast. Doesn't take Novolog if she doesn't eat. Eats inconsistently. 2-4 times a day.  Didn't bring meter.  Has been a little light headed and dizzy.      FBS. 143 mg/dl. BS at bedtime. 189 mg/dl.  Diet is inconsistent to meet her nutritional needs and control her blood sugars..    ASSESSMENT Wt Readings from Last 3 Encounters:  07/19/16 242 lb (109.8 kg)  06/16/16 238 lb (108 kg)  05/05/16 230 lb (104.3 kg)   Ht Readings from Last 3 Encounters:  07/19/16 5' 5"  (1.651 m)  06/16/16 5' 5"  (1.651 m)  05/05/16 5' 5"  (1.651 m)   Body mass index is 40.27 kg/m. @BMIFA @  A1C 8%  Per Dr. Luan Pulling office in June 2017.      Diabetes Self-Management Education - 07/19/16 0954      Visit Information   Visit Type First/Initial     Initial Visit   Diabetes Type Type 2   Are you currently following a meal plan? No   Are you taking your medications as prescribed? No   Date Diagnosed 1990     Health Coping   How would you rate your overall health? Fair     Psychosocial Assessment   Patient Belief/Attitude about Diabetes Motivated to manage diabetes   Self-care barriers Debilitated state due to current medical condition   Self-management support Doctor's office;Family   Other persons present Patient   Patient Concerns Nutrition/Meal planning;Medication;Monitoring;Healthy  Lifestyle;Problem Solving;Glycemic Control;Weight Control;Support   Special Needs None   Preferred Learning Style No preference indicated   Learning Readiness Ready   How often do you need to have someone help you when you read instructions, pamphlets, or other written materials from your doctor or pharmacy? 2 - Rarely   What is the last grade level you completed in school? 13     Pre-Education Assessment   Patient understands the diabetes disease and treatment process. Needs Instruction   Patient understands incorporating nutritional management into lifestyle. Needs Instruction   Patient undertands incorporating physical activity into lifestyle. Needs Instruction   Patient understands using medications safely. Needs Instruction   Patient understands monitoring blood glucose, interpreting and using results Needs Instruction   Patient understands prevention, detection, and treatment of acute complications. Needs Instruction   Patient understands prevention, detection, and treatment of chronic complications. Needs Instruction   Patient understands how to develop strategies to address psychosocial issues. Needs Instruction   Patient understands how to develop strategies to promote health/change behavior. Needs Instruction     Complications   Last HgB A1C per patient/outside source 12.3 %   How often do you check your blood sugar? 1-2 times/day   Fasting Blood glucose range (mg/dL) 130-179   Postprandial Blood glucose range (mg/dL) >200;180-200   Number of hypoglycemic episodes per month 0   Number of hyperglycemic episodes per week 15  Can you tell when your blood sugar is high? No   Have you had a dilated eye exam in the past 12 months? No   Have you had a dental exam in the past 12 months? No   Are you checking your feet? No     Dietary Intake   Breakfast didnt' eat breakfast: sometimes eats eggs and toast or oatmeal   Lunch sometimes eats and may skip   Dinner meat vegetabes if she  feels like it; ate ony chicken last night. didn't feel good   Beverage(s) water     Exercise   Exercise Type ADL's     Patient Education   Nutrition management  Role of diet in the treatment of diabetes and the relationship between the three main macronutrients and blood glucose level;Food label reading, portion sizes and measuring food.;Carbohydrate counting;Reviewed blood glucose goals for pre and post meals and how to evaluate the patients' food intake on their blood glucose level.;Meal timing in regards to the patients' current diabetes medication.   Medications Taught/reviewed insulin injection, site rotation, insulin storage and needle disposal.;Reviewed patients medication for diabetes, action, purpose, timing of dose and side effects.;Reviewed medication adjustment guidelines for hyperglycemia and sick days.   Monitoring Purpose and frequency of SMBG.;Taught/discussed recording of test results and interpretation of SMBG.   Acute complications Taught treatment of hypoglycemia - the 15 rule.   Psychosocial adjustment Worked with patient to identify barriers to care and solutions     Individualized Goals (developed by patient)   Nutrition Follow meal plan discussed;General guidelines for healthy choices and portions discussed;Adjust meds/carbs with exercise as discussed   Medications take my medication as prescribed  Will ask MD to prescribe insulin pens instead of vials for greater accuracy   Monitoring  test my blood glucose as discussed;test blood glucose pre and post meals as discussed   Reducing Risk do foot checks daily;examine blood glucose patterns   Health Coping --  Meal planning     Post-Education Assessment   Patient understands the diabetes disease and treatment process. Needs Review   Patient understands incorporating nutritional management into lifestyle. Needs Review   Patient undertands incorporating physical activity into lifestyle. Needs Review   Patient understands  using medications safely. Needs Review   Patient understands monitoring blood glucose, interpreting and using results Needs Review   Patient understands prevention, detection, and treatment of acute complications. Needs Review   Patient understands prevention, detection, and treatment of chronic complications. Needs Review   Patient understands how to develop strategies to address psychosocial issues. Needs Review   Patient understands how to develop strategies to promote health/change behavior. Needs Review     Outcomes   Expected Outcomes Demonstrated interest in learning. Expect positive outcomes   Future DMSE --  1 week   Program Status Completed      Individualized Plan for Diabetes Self-Management Training:   Learning Objective:  Patient will have a greater understanding of diabetes self-management. Patient education plan is to attend individual and/or group sessions per assessed needs and concerns.   Plan:  Goals 1. Eat three meals a day as discussed Breakfast 6-8 am, Lunch 12-2 pm and Dinner 5-7 pm 2. NO snacks between meals unless having a low blood sugar 3. Follow the Plate Method as shown for meals 4. Eat 2-3 carb choices per meal 5. May choose to use Healthy Choice or Smart ones for meals when you're tired and don't feel like cooking 6. Ask Dr. Luan Pulling to prescribe  insulin pens instead of vial and syringes. Need appt for foot doctor and eye doctor Ask Dr. Luan Pulling if you can reduce your Lantus to 60 units and only give it once a day at 7/8 pm instead of 40 units twice a day. Take your fluid pills as prescribed. DO NOT SKIP MEALS Take 10 units of Novolog before meals as long as blood sugar is above 90. Drink 6 glasses of water per day Notify Heart Doctor of your weight gain. It may be fluid and you may need adjustment of medications Record Blood sugars on sheet and bring meter and BS log at each visit  Expected Outcomes:  Demonstrated interest in learning. Expect  positive outcomes  Education material provided: Living Well with Diabetes, Meal plan card and My Plate  If problems or questions, patient to contact team via:  Phone and Email  Future DSME appointment:  (1 week)

## 2016-07-19 NOTE — Patient Instructions (Addendum)
Goals 1. Eat three meals a day as discussed Breakfast 6-8 am, Lunch 12-2 pm and Dinner 5-7 pm 2. NO snacks between meals unless having a low blood sugar 3. Follow the Plate Method as shown for meals 4. Eat 2-3 carb choices per meal 5. May choose to use Healthy Choice or Smart ones for meals when you're tired and don't feel like cooking 6. Ask Dr. Luan Pulling to prescribe insulin pens instead of vial and syringes. Need appt for foot doctor and eye doctor Ask Dr. Luan Pulling if you can reduce your Lantus to 60 units and only give it once a day at 7/8 pm instead of 40 units twice a day. Take your fluid pills as prescribed. DO NOT SKIP MEALS Take 10 units of Novolog before meals as long as blood sugar is above 90. Drink 6 glasses of water per day Notify Heart Doctor of your weight gain. It may be fluid and you may need adjustment of medications Record Blood sugars on sheet and bring meter and BS log at each visit

## 2016-07-21 ENCOUNTER — Telehealth: Payer: Self-pay | Admitting: Cardiology

## 2016-07-21 NOTE — Telephone Encounter (Signed)
Returned call to patient.  Did not see where anyone in this office tried to contact her.  Suggested it may have been in regards to her nutrition appt as she was just there on 07/19/2016.  Patient stated she had been very dizzy lately the last few days along with vomiting.  No c/o fever.  Did get the flu shot last week.  Advised her to follow up with Dr. Luan Pulling regarding these symptoms and changing Lantus dose per nutrition recommendations.  She verbalized understanding.

## 2016-07-21 NOTE — Telephone Encounter (Signed)
Patient stated someone called her twice yesterday

## 2016-07-28 ENCOUNTER — Encounter: Payer: Commercial Managed Care - HMO | Admitting: Nutrition

## 2016-08-19 ENCOUNTER — Encounter: Payer: Self-pay | Admitting: Cardiology

## 2016-08-19 ENCOUNTER — Ambulatory Visit (INDEPENDENT_AMBULATORY_CARE_PROVIDER_SITE_OTHER): Payer: Commercial Managed Care - HMO | Admitting: Cardiology

## 2016-08-19 VITALS — BP 118/76 | HR 75 | Ht 65.0 in | Wt 240.2 lb

## 2016-08-19 DIAGNOSIS — K219 Gastro-esophageal reflux disease without esophagitis: Secondary | ICD-10-CM

## 2016-08-19 DIAGNOSIS — E782 Mixed hyperlipidemia: Secondary | ICD-10-CM

## 2016-08-19 DIAGNOSIS — I2510 Atherosclerotic heart disease of native coronary artery without angina pectoris: Secondary | ICD-10-CM | POA: Diagnosis not present

## 2016-08-19 DIAGNOSIS — I5022 Chronic systolic (congestive) heart failure: Secondary | ICD-10-CM

## 2016-08-19 NOTE — Progress Notes (Signed)
Clinical Summary [name] is a 65 y.o.female seen today for follow up of the following meidcal problems.   1. Chronic combined systolicand diastolic heart failure - echo 03/2014 LVEF 25-30%, restrictive diastolic dysfunction. New diagnosis at that time. Repeat echo 06/2014 LVEF 40-45% - cath 03/2014 showed RCA 99% with left to right collaterals, other arteries patent. Overall LV systolic dysfunction out of proportion to CAD. Medically managed.  - 07/2015 echo LVEF [id number], grade I diastolic dysfunction   - last visit we changed losartan to entresto. Entresto causes dizziness and nausea, stopped taking.  - breathing overall unchanged. No recent LE edema.When lays down flat can have some reflux like discomfort in chest.   - she is interested in cardiac rehab  2. CAD - cath 03/2014 with 99% chronic RCA disease, otherwise patent vessels. Managed medically - she denies any recent chest pain  3. OSA  - severe OSA by sleep study 04/2016 - she is awaiting her CPAP   4. Hyperlipidemia - compliant with statin 02/2016: TC 84 TG 79 HDL 40 LDL 28 Past Medical History:  Diagnosis Date  . Anemia   . Arthritis   . CAD (coronary artery disease)    a. LHC (03/2014) Lmain: nl, LAD: diff dz proximal 20%, 30-40% dz mid vessel prior 2nd diagonal, LCx: 40% in OM1, 20-30% in OM2, RCA: 99% subtotal occlusion @ crux, TIMI 1 flow, L-R collaterals to distal vessel  . Chronic systolic heart failure ([place])    a. ECHO (03/2014): EF 25-30%, akinesis enteroanteroseptal myocardium, grade III DD b. RHC (03/2014) RA 4, RV 42/4, PA 44/14 (26), PCWP 21, PA 62% Fick CO/CI 6.9 / 3.4  . Diabetes mellitus   . Duodenal ulcer    Age 60  . Erosive esophagitis   . Gastritis   . Hypertension   . Ischemic cardiomyopathy   . Obese   . Short-term memory loss   . Thyroid disease   . TTP (thrombotic thrombocytopenic purpura) (HCC)      Allergies  Allergen Reactions  . Other Other (See Comments)    FLAGYL, CIPRO,  PROTONIX taken at the same time caused patient to lose her breath and have trouble breathing, requiring hospital stay at [facility], 03/23/14-per patient.   . Lisinopril Cough     Current Outpatient Prescriptions  Medication Sig Dispense Refill  . acetaminophen (TYLENOL) 500 MG tablet Take 1,000 mg by mouth every 6 (six) hours as needed for moderate pain.     Marland Kitchen aspirin 81 MG chewable tablet Chew 1 tablet (81 mg total) by mouth daily.    Marland Kitchen atorvastatin (LIPITOR) 80 MG tablet Take 1 tablet (80 mg total) by mouth daily at 6 PM. 90 tablet 3  . carvedilol (COREG) 25 MG tablet Take 1 tablet (25 mg total) by mouth 2 (two) times daily. 180 tablet 3  . furosemide (LASIX) 40 MG tablet TAKE ONE TABLET BY MOUTH DAILY. 90 tablet 3  . insulin glargine (LANTUS) 100 UNIT/ML injection Inject 40 Units into the skin 2 (two) times daily.    Marland Kitchen losartan-hydrochlorothiazide (HYZAAR) 50-12.5 MG tablet Take 1 tablet by mouth daily.    . Multiple Vitamin (MULTIVITAMIN) tablet Take 1 tablet by mouth daily.    Marland Kitchen NOVOLOG 100 UNIT/ML injection Inject 10 Units into the skin 3 (three) times daily with meals.     . polyethylene glycol (MIRALAX / GLYCOLAX) packet Take 17 g by mouth daily.    . sacubitril-valsartan (ENTRESTO) 49-51 MG Take 1 tablet by  mouth 2 (two) times daily. 28 tablet 0  . spironolactone (ALDACTONE) 25 MG tablet Take 1 tablet (25 mg total) by mouth daily. 90 tablet 3   No current facility-administered medications for this visit.      Past Surgical History:  Procedure Laterality Date  . COLONOSCOPY  08/29/2012   [id number] diverticulosis  . COLONOSCOPY N/A 05/28/2015   Procedure: COLONOSCOPY;  Surgeon: Daneil Dolin, MD;  Location: [facility];  Service: Endoscopy;  Laterality: N/A;  1015  . ECTOPIC PREGNANCY SURGERY    . ESOPHAGOGASTRODUODENOSCOPY  04/20/2012   [id number] antral gastritis/Erosive reflux esophagitis  . LEFT AND RIGHT HEART CATHETERIZATION WITH CORONARY ANGIOGRAM N/A 03/28/2014   Procedure:  LEFT AND RIGHT HEART CATHETERIZATION WITH CORONARY ANGIOGRAM;  Surgeon: Peter M Martinique, MD;  Location: [facility] CATH LAB;  Service: Cardiovascular;  Laterality: N/A;  . SPLENECTOMY, PARTIAL       Allergies  Allergen Reactions  . Other Other (See Comments)    FLAGYL, CIPRO, PROTONIX taken at the same time caused patient to lose her breath and have trouble breathing, requiring hospital stay at [facility], 03/23/14-per patient.   . Lisinopril Cough      Family History  Problem Relation Age of Onset  . Heart failure Mother   . Cirrhosis Father     ETOH  . Diabetes Daughter   . Colon cancer Neg Hx      Social History [name] reports that she quit smoking about 37 years ago. Her smoking use included Cigarettes. She started smoking about 49 years ago. She has a 2.50 pack-year smoking history. She has never used smokeless tobacco. [name] reports that she does not drink alcohol.   Review of Systems CONSTITUTIONAL: No weight loss, fever, chills, weakness or fatigue.  HEENT: Eyes: No visual loss, blurred vision, double vision or yellow sclerae.No hearing loss, sneezing, congestion, runny nose or sore throat.  SKIN: No rash or itching.  CARDIOVASCULAR: per hpi RESPIRATORY: No shortness of breath, cough or sputum.  GASTROINTESTINAL: No anorexia, nausea, vomiting or diarrhea. No abdominal pain or blood.  GENITOURINARY: No burning on urination, no polyuria NEUROLOGICAL: No headache, dizziness, syncope, paralysis, ataxia, numbness or tingling in the extremities. No change in bowel or bladder control.  MUSCULOSKELETAL: No muscle, back pain, joint pain or stiffness.  LYMPHATICS: No enlarged nodes. No history of splenectomy.  PSYCHIATRIC: No history of depression or anxiety.  ENDOCRINOLOGIC: No reports of sweating, cold or heat intolerance. No polyuria or polydipsia.  Marland Kitchen   Physical Examination Vitals:   08/19/16 1004  BP: 118/76  Pulse: 75   Vitals:   08/19/16 1004  Weight: 240 lb 3.2 oz (109  kg)  Height: 5\' 5"  (1.651 m)    Gen: resting comfortably, no acute distress HEENT: no scleral icterus, pupils equal round and reactive, no palptable cervical adenopathy,  CV: RRR, no m/r/g, no jvd Resp: Clear to auscultation bilaterally GI: abdomen is soft, non-tender, non-distended, normal bowel sounds, no hepatosplenomegaly MSK: extremities are warm, no edema.  Skin: warm, no rash Neuro:  no focal deficits Psych: appropriate affect   Diagnostic Studies 03/2014 echo Study Conclusions  - Left ventricle: The cavity size was normal. There was mild focal basal hypertrophy of the septum. Systolic function was severely reduced. The estimated ejection fraction was in the range of 25% to 30%. There is akinesis of the entireanteroseptal myocardium. Doppler parameters are consistent with a reversible restrictive pattern, indicative of decreased left ventricular diastolic compliance and/or increased left atrial pressure (grade  3 diastolic dysfunction). - Mitral valve: There was mild regurgitation. - Left atrium: The atrium was moderately dilated. 03/2014 Cath Procedural Findings:  Hemodynamics  RA 8/0 mean 4 mm Hg  RV 42/4 mm Hg  PA 44/14 mean 26 mm Hg  PCWP 20/28 mean 21 mm Hg  LV 98/18 mm Hg  AO 95/54 mean 72 mm Hg  Oxygen saturations:  PA 62%  AO 90%  Cardiac Output (Fick) 6.9 L/min  Cardiac Index (Fick) 3.4 L/min/meter squared  Coronary angiography:  Coronary dominance: right  Left mainstem: Normal  Left anterior descending (LAD): Diffuse disease in the proximal vessel up to 20%. 30-40% disease in the mid vessel prior to the second diagonal.  Left circumflex (LCx): 40% in OM1. 20-30% in OM2. Otherwise no significant disease.  Right coronary artery (RCA): 99% subtotal occlusion at the crux. TIMI 1 flow. Left to right collaterals to the distal vessel.  Left ventriculography: Left ventricular systolic function is abnormal, LVEF is estimated at 25%. There is  inferior akinesis and severe global hypokinesis. There is mild mitral regurgitation  Final Conclusions:  1. Single vessel obstructive CAD with subtotal occlusion of the RCA at the crux.  2. Severe LV dysfunction.  3. Normal Right heart pressures. Elevated PCWP.  Recommendations: Medical management. Her degree of LV dysfunction is out of proportion to her CAD. She has no anginal symptoms so PCI of RCA not indicated. The inferior wall appears scarred.     Assessment and Plan  1. Chronic combined systolic/diastolic HF - NICM, LVEF [id number] now up to 40-45%, NYHA II. - did not tolerate entresto, restarted losartan - refer to cardiac rehab  2. CAD - medically managed single vessel RCA disease - no recent symptoms.  - continue current therapy.   3. OSA  -awaiting CPAP machine.   4. Hyperlipidemia -we will continue statin  5. GERD - recommend OTC zantac   F/u 4 months      Arnoldo Lenis, M.D.

## 2016-08-19 NOTE — Patient Instructions (Signed)
Your physician recommends that you schedule a follow-up appointment in: [place] [address] [place]   Your physician has recommended you make the following change in your medication:   START OTC ZANTAC 150 MG TWICE DAILY  You have been referred to [place] physician recommends that you return for lab work in: 2 WEEKS BMP  Thank you for choosing [place]!!

## 2016-09-23 ENCOUNTER — Encounter (HOSPITAL_COMMUNITY): Payer: Commercial Managed Care - HMO

## 2016-09-29 ENCOUNTER — Other Ambulatory Visit: Payer: Self-pay | Admitting: Cardiology

## 2016-10-14 ENCOUNTER — Encounter (HOSPITAL_COMMUNITY)
Admission: RE | Admit: 2016-10-14 | Discharge: 2016-10-14 | Disposition: A | Discharge status: Home / Self Care | Payer: Medicare HMO | Source: Ambulatory Visit | Attending: Cardiology | Admitting: Cardiology

## 2016-10-14 VITALS — BP 88/62 | Ht 65.0 in | Wt 244.3 lb

## 2016-10-14 DIAGNOSIS — Z7982 Long term (current) use of aspirin: Secondary | ICD-10-CM | POA: Insufficient documentation

## 2016-10-14 DIAGNOSIS — I255 Ischemic cardiomyopathy: Secondary | ICD-10-CM | POA: Insufficient documentation

## 2016-10-14 DIAGNOSIS — E079 Disorder of thyroid, unspecified: Secondary | ICD-10-CM | POA: Diagnosis not present

## 2016-10-14 DIAGNOSIS — E119 Type 2 diabetes mellitus without complications: Secondary | ICD-10-CM | POA: Diagnosis not present

## 2016-10-14 DIAGNOSIS — M311 Thrombotic microangiopathy: Secondary | ICD-10-CM | POA: Diagnosis not present

## 2016-10-14 DIAGNOSIS — I110 Hypertensive heart disease with heart failure: Secondary | ICD-10-CM | POA: Diagnosis not present

## 2016-10-14 DIAGNOSIS — Z6841 Body Mass Index (BMI) 40.0 and over, adult: Secondary | ICD-10-CM | POA: Diagnosis not present

## 2016-10-14 DIAGNOSIS — R413 Other amnesia: Secondary | ICD-10-CM | POA: Insufficient documentation

## 2016-10-14 DIAGNOSIS — Z87891 Personal history of nicotine dependence: Secondary | ICD-10-CM | POA: Insufficient documentation

## 2016-10-14 DIAGNOSIS — E669 Obesity, unspecified: Secondary | ICD-10-CM | POA: Diagnosis not present

## 2016-10-14 DIAGNOSIS — I2510 Atherosclerotic heart disease of native coronary artery without angina pectoris: Secondary | ICD-10-CM | POA: Diagnosis not present

## 2016-10-14 DIAGNOSIS — Z8711 Personal history of peptic ulcer disease: Secondary | ICD-10-CM | POA: Diagnosis not present

## 2016-10-14 DIAGNOSIS — Z79899 Other long term (current) drug therapy: Secondary | ICD-10-CM | POA: Diagnosis not present

## 2016-10-14 DIAGNOSIS — Z794 Long term (current) use of insulin: Secondary | ICD-10-CM | POA: Insufficient documentation

## 2016-10-14 DIAGNOSIS — M1990 Unspecified osteoarthritis, unspecified site: Secondary | ICD-10-CM | POA: Insufficient documentation

## 2016-10-14 DIAGNOSIS — I5022 Chronic systolic (congestive) heart failure: Secondary | ICD-10-CM | POA: Insufficient documentation

## 2016-10-14 NOTE — Progress Notes (Signed)
Cardiac/Pulmonary Rehab Medication Review by a Pharmacist  Does the patient  feel that his/her medications are working for him/her?  yes  Has the patient been experiencing any side effects to the medications prescribed?  no  Does the patient measure his/her own blood pressure or blood glucose at home?  no   Does the patient have any problems obtaining medications due to transportation or finances?   no  Understanding of regimen: good Understanding of indications: good Potential of compliance: excellent   Pharmacist comments: Pleasant 66 yo female that I had the opportunity to review her medications with her.  She had a current list and was aware  of each medication indication. She does not check her BP because she does not know how to use the machin. I stressed the importance and she will have her daughter show her how to use hers who is a Marine scientist or bring it in so we can show her. She does monitor her Blood sugars 3-4 times per day.  Patient is compliant with medications and not having any problems.  Thank you for the opportunity to participate in the care of this patient, [name], BS Vena [place], BCPS Clinical Pharmacist Pager [phone]  10/14/2016 1:46 PM

## 2016-10-14 NOTE — Progress Notes (Signed)
Cardiac Individual Treatment Plan  Patient Details  Name: [name] MRN: [id number] Date of Birth: Dec 17, 1950 Referring Provider:   Flowsheet Row CARDIAC REHAB PHASE II ORIENTATION from 10/14/2016 in [place]  Referring Provider  Dr. Harl Bowie      Initial Encounter Date:  Flowsheet Row CARDIAC REHAB PHASE II ORIENTATION from 10/14/2016 in [place]  Date  10/14/16  Referring Provider  Dr. Harl Bowie      Visit Diagnosis: Chronic systolic CHF (congestive heart failure) ([place])  Patient's Home Medications on Admission:  Current Outpatient Prescriptions:  .  acetaminophen (TYLENOL) 500 MG tablet, Take 1,000 mg by mouth every 6 (six) hours as needed for moderate pain. , Disp: , Rfl:  .  aspirin 81 MG chewable tablet, Chew 1 tablet (81 mg total) by mouth daily., Disp: , Rfl:  .  atorvastatin (LIPITOR) 80 MG tablet, Take 1 tablet (80 mg total) by mouth daily at 6 PM., Disp: 90 tablet, Rfl: 3 .  carvedilol (COREG) 25 MG tablet, Take 1 tablet (25 mg total) by mouth 2 (two) times daily., Disp: 180 tablet, Rfl: 3 .  furosemide (LASIX) 40 MG tablet, TAKE ONE TABLET BY MOUTH DAILY., Disp: 90 tablet, Rfl: 0 .  insulin glargine (LANTUS) 100 UNIT/ML injection, Inject 40 Units into the skin 2 (two) times daily., Disp: , Rfl:  .  losartan (COZAAR) 50 MG tablet, Take 50 mg by mouth daily., Disp: , Rfl:  .  Multiple Vitamin (MULTIVITAMIN) tablet, Take 1 tablet by mouth daily., Disp: , Rfl:  .  NOVOLOG 100 UNIT/ML injection, Inject 10 Units into the skin 3 (three) times daily with meals. , Disp: , Rfl:  .  polyethylene glycol (MIRALAX / GLYCOLAX) packet, Take 17 g by mouth daily., Disp: , Rfl:  .  ranitidine (ZANTAC) 150 MG tablet, Take 150 mg by mouth 2 (two) times daily., Disp: , Rfl:  .  spironolactone (ALDACTONE) 25 MG tablet, Take 1 tablet (25 mg total) by mouth daily., Disp: 90 tablet, Rfl: 3  Past Medical History: Past Medical History:  Diagnosis  Date  . Anemia   . Arthritis   . CAD (coronary artery disease)    a. LHC (03/2014) [place]: nl, LAD: diff dz proximal 20%, 30-40% dz mid vessel prior 2nd diagonal, LCx: 40% in OM1, 20-30% in OM2, RCA: 99% subtotal occlusion @ crux, TIMI 1 flow, L-R collaterals to distal vessel  . Chronic systolic heart failure ([place])    a. ECHO (03/2014): EF 25-30%, akinesis enteroanteroseptal myocardium, grade III DD b. RHC (03/2014) RA 4, RV 42/4, PA 44/14 (26), PCWP 21, PA 62% Fick CO/CI 6.9 / 3.4  . Diabetes mellitus   . Duodenal ulcer    Age 66  . Erosive esophagitis   . Gastritis   . Hypertension   . Ischemic cardiomyopathy   . Obese   . Short-term memory loss   . Thyroid disease   . TTP (thrombotic thrombocytopenic purpura) ([facility])     Tobacco Use: History  Smoking Status  . Former Smoker  . Packs/day: 0.25  . Years: 10.00  . Types: Cigarettes  . Start date: 09/20/1966  . Quit date: 09/20/1978  Smokeless Tobacco  . Never Used    Comment: Quit 2008    Labs: Recent Review Flowsheet Data    Labs for ITP Cardiac and Pulmonary Rehab Latest Ref Rng & Units 03/23/2014 03/24/2014 03/28/2014 03/28/2014 03/28/2014   Hemoglobin A1c <5.7 % - - - - -   PHART  7.350 - 7.450 7.393 - - 7.436 7.413   PCO2ART 35.0 - 45.0 mmHg 35.3 - - 43.8 40.7   HCO3 20.0 - 24.0 mEq/L 21.6 - 29.1(H) 29.4(H) 26.0(H)   TCO2 0 - 100 mmol/L 23 - 30 31 27    ACIDBASEDEF 0.0 - 2.0 mmol/L 3.0(H) - - - -   O2SAT % 94.0 68.5 62.0 84.0 90.0      Capillary Blood Glucose: Lab Results  Component Value Date   GLUCAP 101 (H) 06/14/2015   GLUCAP 83 05/28/2015   GLUCAP 156 (H) 04/15/2014   GLUCAP 107 (H) 04/15/2014   GLUCAP 73 04/14/2014     Exercise Target Goals: Date: 10/14/16  Exercise Program Goal: Individual exercise prescription set with THRR, safety & activity barriers. Participant demonstrates ability to understand and report RPE using BORG scale, to self-measure pulse accurately, and to acknowledge the importance of the exercise  prescription.  Exercise Prescription Goal: Starting with aerobic activity 30 plus minutes a day, 3 days per week for initial exercise prescription. Provide home exercise prescription and guidelines that participant acknowledges understanding prior to discharge.  Activity Barriers & Risk Stratification:     Activity Barriers & Cardiac Risk Stratification - 10/14/16 1501      Activity Barriers & Cardiac Risk Stratification   Activity Barriers None   Cardiac Risk Stratification High      6 Minute Walk:     6 Minute Walk    Row Name 10/14/16 1458         6 Minute Walk   Phase Initial     Distance 700 feet  Patient had to stop and rest for 30 seconds     Distance % Change 0 %     Walk Time 6 minutes     # of Rest Breaks 0     MPH 1.3     METS 2.01     RPE 15     Perceived Dyspnea  13     VO2 Peak 4.08     Symptoms No     Resting HR 86 bpm     Resting BP 88/62     Max Ex. HR 98 bpm     Max Ex. BP 120/66     2 Minute Post BP 94/60        Initial Exercise Prescription:     Initial Exercise Prescription - 10/14/16 1400      Date of Initial Exercise RX and Referring Provider   Date 10/14/16   Referring Provider Dr. Alyson Reedy   Level 2   Watts 10   Minutes 20   METs 1.6     Arm Ergometer   Level 2   Watts 19   Minutes 15   METs 2.5     Prescription Details   Frequency (times per week) 3   Duration Progress to 30 minutes of continuous aerobic without signs/symptoms of physical distress     Intensity   THRR REST +  30   THRR 40-80% of Max Heartrate 113-126-140   Ratings of Perceived Exertion 11-13   Perceived Dyspnea 0-4     Progression   Progression Continue progressive overload as per policy without signs/symptoms or physical distress.     Resistance Training   Training Prescription Yes   Weight 1   Reps 10-12      Perform Capillary Blood Glucose checks as needed.  Exercise Prescription Changes:   Exercise  Comments:    Discharge Exercise  Prescription (Final Exercise Prescription Changes):   Nutrition:  Target Goals: Understanding of nutrition guidelines, daily intake of sodium 1500mg , cholesterol 200mg , calories 30% from fat and 7% or less from saturated fats, daily to have 5 or more servings of fruits and vegetables.  Biometrics:     Pre Biometrics - 10/14/16 1501      Pre Biometrics   Height 5\' 5"  (1.651 m)   Weight 244 lb 4.3 oz (110.8 kg)   Waist Circumference 47.5 inches   Hip Circumference 50.5 inches   Waist to Hip Ratio 0.94 %   BMI (Calculated) 40.7   Triceps Skinfold 26 mm   % Body Fat 49.7 %   Grip Strength 51.33 kg   Flexibility 0 in   Single Leg Stand 7 seconds       Nutrition Therapy Plan and Nutrition Goals:   Nutrition Discharge: Rate Your Plate Scores:     Nutrition Assessments - 10/14/16 1522      MEDFICTS Scores   Pre Score 50      Nutrition Goals Re-Evaluation:   Psychosocial: Target Goals: Acknowledge presence or absence of depression, maximize coping skills, provide positive support system. Participant is able to verbalize types and ability to use techniques and skills needed for reducing stress and depression.  Initial Review & Psychosocial Screening:     Initial Psych Review & Screening - 10/14/16 1530      Initial Review   Current issues with Current Sleep Concerns     Family Dynamics   Good Support System? Yes     Barriers   Psychosocial barriers to participate in program There are no identifiable barriers or psychosocial needs.     Screening Interventions   Interventions Encouraged to exercise      Quality of Life Scores:     Quality of Life - 10/14/16 1502      Quality of Life Scores   Health/Function Pre 17.1 %   Socioeconomic Pre 24.13 %   Psych/Spiritual Pre 24 %   Family Pre 23.7 %   GLOBAL Pre 21.03 %      PHQ-9: Recent Review Flowsheet Data    Depression screen [facility] 2/9 10/14/2016 07/19/2016 01/30/2015  01/01/2015   Decreased Interest 0 0 0 0   Down, Depressed, Hopeless 0 0 0 0    PHQ - 2 Score 0 0 0 0   Altered sleeping 1 - - -   Tired, decreased energy 2 - - -   Change in appetite 2 - - -   Feeling bad or failure about yourself  0 - - -   Trouble concentrating 2 - - -   Moving slowly or fidgety/restless 0 - - -   Suicidal thoughts 0 - - -   PHQ-9 Score 7 - - -   Difficult doing work/chores Somewhat difficult - - -      Psychosocial Evaluation and Intervention:     Psychosocial Evaluation - 10/14/16 1530      Psychosocial Evaluation & Interventions   Interventions Encouraged to exercise with the program and follow exercise prescription   Continued Psychosocial Services Needed No      Psychosocial Re-Evaluation:   Vocational Rehabilitation: Provide vocational rehab assistance to qualifying candidates.   Vocational Rehab Evaluation & Intervention:     Vocational Rehab - 10/14/16 1502      Initial Vocational Rehab Evaluation & Intervention   Assessment shows need for Vocational Rehabilitation No      Education: Education Goals: Education classes  will be provided on a weekly basis, covering required topics. Participant will state understanding/return demonstration of topics presented.  Learning Barriers/Preferences:     Learning Barriers/Preferences - 10/14/16 1501      Learning Barriers/Preferences   Learning Preferences Pictoral;Video;Written Material      Education Topics: Hypertension, Hypertension Reduction -Define heart disease and high blood pressure. Discus how high blood pressure affects the body and ways to reduce high blood pressure.   Exercise and Your Heart -Discuss why it is important to exercise, the FITT principles of exercise, normal and abnormal responses to exercise, and how to exercise safely.   Angina -Discuss definition of angina, causes of angina, treatment of angina, and how to decrease risk of having angina.   Cardiac  Medications -Review what the following cardiac medications are used for, how they affect the body, and side effects that may occur when taking the medications.  Medications include Aspirin, Beta blockers, calcium channel blockers, ACE Inhibitors, angiotensin receptor blockers, diuretics, digoxin, and antihyperlipidemics.   Congestive Heart Failure -Discuss the definition of CHF, how to live with CHF, the signs and symptoms of CHF, and how keep track of weight and sodium intake.   Heart Disease and Intimacy -Discus the effect sexual activity has on the heart, how changes occur during intimacy as we age, and safety during sexual activity.   Smoking Cessation / COPD -Discuss different methods to quit smoking, the health benefits of quitting smoking, and the definition of COPD.   Nutrition I: Fats -Discuss the types of cholesterol, what cholesterol does to the heart, and how cholesterol levels can be controlled.   Nutrition II: Labels -Discuss the different components of food labels and how to read food label   Heart Parts and Heart Disease -Discuss the anatomy of the heart, the pathway of blood circulation through the heart, and these are affected by heart disease.   Stress I: Signs and Symptoms -Discuss the causes of stress, how stress may lead to anxiety and depression, and ways to limit stress.   Stress II: Relaxation -Discuss different types of relaxation techniques to limit stress.   Warning Signs of Stroke / TIA -Discuss definition of a stroke, what the signs and symptoms are of a stroke, and how to identify when someone is having stroke.   Knowledge Questionnaire Score:     Knowledge Questionnaire Score - 10/14/16 1502      Knowledge Questionnaire Score   Pre Score 16/24      Core Components/Risk Factors/Patient Goals at Admission:     Personal Goals and Risk Factors at Admission - 10/14/16 1522      Core Components/Risk Factors/Patient Goals on Admission     Weight Management Yes   Admit Weight 244 lb 8 oz (110.9 kg)   Goal Weight: Short Term 234 lb 8 oz (106.4 kg)   Goal Weight: Long Term 224 lb 8 oz (101.8 kg)   Expected Outcomes Short Term: Continue to assess and modify interventions until short term weight is achieved;Long Term: Adherence to nutrition and physical activity/exercise program aimed toward attainment of established weight goal   Sedentary Yes   Intervention Provide advice, education, support and counseling about physical activity/exercise needs.;Develop an individualized exercise prescription for aerobic and resistive training based on initial evaluation findings, risk stratification, comorbidities and participant's personal goals.   Expected Outcomes Achievement of increased cardiorespiratory fitness and enhanced flexibility, muscular endurance and strength shown through measurements of functional capacity and personal statement of participant.   Increase Strength and  Stamina Yes   Intervention Provide advice, education, support and counseling about physical activity/exercise needs.;Develop an individualized exercise prescription for aerobic and resistive training based on initial evaluation findings, risk stratification, comorbidities and participant's personal goals.   Expected Outcomes Achievement of increased cardiorespiratory fitness and enhanced flexibility, muscular endurance and strength shown through measurements of functional capacity and personal statement of participant.   Diabetes Yes   Intervention Provide education about signs/symptoms and action to take for hypo/hyperglycemia.;Provide education about proper nutrition, including hydration, and aerobic/resistive exercise prescription along with prescribed medications to achieve blood glucose in normal ranges: Fasting glucose 65-99 mg/dL   Expected Outcomes Short Term: Participant verbalizes understanding of the signs/symptoms and immediate care of hyper/hypoglycemia, proper  foot care and importance of medication, aerobic/resistive exercise and nutrition plan for blood glucose control.;Long Term: Attainment of HbA1C < 7%.   Heart Failure Yes   Intervention Provide a combined exercise and nutrition program that is supplemented with education, support and counseling about heart failure. Directed toward relieving symptoms such as shortness of breath, decreased exercise tolerance, and extremity edema.   Expected Outcomes Long term: Adoption of self-care skills and reduction of barriers for early signs and symptoms recognition and intervention leading to self-care maintenance.;Short term: Attendance in program 2-3 days a week with increased exercise capacity. Reported lower sodium intake. Reported increased fruit and vegetable intake. Reports medication compliance.   Personal Goal Other Yes   Personal Goal Get around more, Get stronger, lose weight   Intervention Attend CR 3xweek and supplement with 2xweek at home   Expected Outcomes Reach personal goals.       Core Components/Risk Factors/Patient Goals Review:      Goals and Risk Factor Review    Row Name 10/14/16 1529             Core Components/Risk Factors/Patient Goals Review   Personal Goals Review Weight Management/Obesity;Sedentary;Increase Strength and Stamina;Heart Failure;Diabetes          Core Components/Risk Factors/Patient Goals at Discharge (Final Review):      Goals and Risk Factor Review - 10/14/16 1529      Core Components/Risk Factors/Patient Goals Review   Personal Goals Review Weight Management/Obesity;Sedentary;Increase Strength and Stamina;Heart Failure;Diabetes      ITP Comments:   Comments: Patient arrived for 1st visit/orientation/education at 1230. Patient was referred to CR by Dr. Harl Bowie due to Chronic Systolic CHF ([id number]). During orientation advised patient on arrival and appointment times what to wear, what to do before, during and after exercise. Reviewed attendance and  class policy. Talked about inclement weather and class consultation policy. Pt is scheduled to return Cardiac Rehab on 10/18/16 at 11 am. Pt was advised to come to class 15 minutes before class starts. Patient was also given instructions on meeting with the dietician and attending the Family Structure classes. Pt is eager to get started. Patient participated in warm up stretches followed by light weights and resistance bands. Patient was then able to complete 6 minute walk test. Patient had to stop to rest 30 seconds, then resumed the test and was able to finish. Patient was measured for the equipment. Discussed equipment safety with patient. Took patient pre-anthropometric measurements. Patient finished visit at 1500.

## 2016-10-18 ENCOUNTER — Encounter (HOSPITAL_COMMUNITY)
Admission: RE | Admit: 2016-10-18 | Discharge: 2016-10-18 | Disposition: A | Discharge status: Home / Self Care | Payer: Medicare HMO | Source: Ambulatory Visit | Attending: Cardiology | Admitting: Cardiology

## 2016-10-18 DIAGNOSIS — E119 Type 2 diabetes mellitus without complications: Secondary | ICD-10-CM | POA: Diagnosis not present

## 2016-10-18 DIAGNOSIS — Z794 Long term (current) use of insulin: Secondary | ICD-10-CM | POA: Diagnosis not present

## 2016-10-18 DIAGNOSIS — I5022 Chronic systolic (congestive) heart failure: Secondary | ICD-10-CM

## 2016-10-18 DIAGNOSIS — I2510 Atherosclerotic heart disease of native coronary artery without angina pectoris: Secondary | ICD-10-CM | POA: Diagnosis not present

## 2016-10-18 DIAGNOSIS — I255 Ischemic cardiomyopathy: Secondary | ICD-10-CM | POA: Diagnosis not present

## 2016-10-18 DIAGNOSIS — Z8711 Personal history of peptic ulcer disease: Secondary | ICD-10-CM | POA: Diagnosis not present

## 2016-10-18 DIAGNOSIS — M1990 Unspecified osteoarthritis, unspecified site: Secondary | ICD-10-CM | POA: Diagnosis not present

## 2016-10-18 DIAGNOSIS — I110 Hypertensive heart disease with heart failure: Secondary | ICD-10-CM | POA: Diagnosis not present

## 2016-10-18 DIAGNOSIS — Z79899 Other long term (current) drug therapy: Secondary | ICD-10-CM | POA: Diagnosis not present

## 2016-10-18 NOTE — Progress Notes (Signed)
Daily Session Note  Patient Details  Name: [name] MRN: [id number] Date of Birth: Jan 05, 1951 Referring Provider:   Flowsheet Row CARDIAC REHAB PHASE II ORIENTATION from 10/14/2016 in [place]  Referring Provider  Dr. Harl Bowie      Encounter Date: 10/18/2016  Check In:     Session Check In - 10/18/16 1100      Check-In   Location [facility]   Staff Present Suzanne Boron, BS, EP, Exercise Physiologist;Oley Lahaie Wynetta Emery, RN, BSN   Supervising physician immediately available to respond to emergencies See telemetry face sheet for immediately available MD   Medication changes reported     No   Fall or balance concerns reported    No   Warm-up and Cool-down Performed as group-led instruction   Resistance Training Performed Yes   VAD Patient? No     Pain Assessment   Currently in Pain? No/denies   Pain Score 0-No pain   Multiple Pain Sites No      Capillary Blood Glucose: No results found for this or any previous visit (from the past 24 hour(s)).   Goals Met:  Independence with exercise equipment Exercise tolerated well No report of cardiac concerns or symptoms Strength training completed today  Goals Unmet:  Not Applicable  Comments: Check out 1200.   Dr. Kate Sable is Medical Director for [facility] Cardiac and Pulmonary Rehab.

## 2016-10-20 ENCOUNTER — Encounter (HOSPITAL_COMMUNITY)
Admission: RE | Admit: 2016-10-20 | Discharge: 2016-10-20 | Disposition: A | Discharge status: Home / Self Care | Payer: Medicare HMO | Source: Ambulatory Visit | Attending: Cardiology | Admitting: Cardiology

## 2016-10-20 DIAGNOSIS — E119 Type 2 diabetes mellitus without complications: Secondary | ICD-10-CM | POA: Diagnosis not present

## 2016-10-20 DIAGNOSIS — I110 Hypertensive heart disease with heart failure: Secondary | ICD-10-CM | POA: Diagnosis not present

## 2016-10-20 DIAGNOSIS — I2510 Atherosclerotic heart disease of native coronary artery without angina pectoris: Secondary | ICD-10-CM | POA: Diagnosis not present

## 2016-10-20 DIAGNOSIS — Z8711 Personal history of peptic ulcer disease: Secondary | ICD-10-CM | POA: Diagnosis not present

## 2016-10-20 DIAGNOSIS — M1990 Unspecified osteoarthritis, unspecified site: Secondary | ICD-10-CM | POA: Diagnosis not present

## 2016-10-20 DIAGNOSIS — Z794 Long term (current) use of insulin: Secondary | ICD-10-CM | POA: Diagnosis not present

## 2016-10-20 DIAGNOSIS — Z79899 Other long term (current) drug therapy: Secondary | ICD-10-CM | POA: Diagnosis not present

## 2016-10-20 DIAGNOSIS — I5022 Chronic systolic (congestive) heart failure: Secondary | ICD-10-CM

## 2016-10-20 DIAGNOSIS — I255 Ischemic cardiomyopathy: Secondary | ICD-10-CM | POA: Diagnosis not present

## 2016-10-20 NOTE — Progress Notes (Signed)
Daily Session Note  Patient Details  Name: [name] MRN: [id number] Date of Birth: 1951/06/18 Referring Provider:   Flowsheet Row CARDIAC REHAB PHASE II ORIENTATION from 10/14/2016 in [place]  Referring Provider  Dr. Harl Bowie      Encounter Date: 10/20/2016  Check In:     Session Check In - 10/20/16 1100      Check-In   Location [facility]   Staff Present Aundra Dubin, RN, BSN;Emnet Monk Luther Parody, BS, EP, Exercise Physiologist   Supervising physician immediately available to respond to emergencies See telemetry face sheet for immediately available MD   Medication changes reported     No   Fall or balance concerns reported    No   Warm-up and Cool-down Performed as group-led instruction   Resistance Training Performed Yes   VAD Patient? No     Pain Assessment   Currently in Pain? No/denies   Pain Score 0-No pain   Multiple Pain Sites No      Capillary Blood Glucose: No results found for this or any previous visit (from the past 24 hour(s)).   Goals Met:  Independence with exercise equipment Exercise tolerated well No report of cardiac concerns or symptoms Strength training completed today  Goals Unmet:  Not Applicable  Comments: Check out 1200   Dr. Kate Sable is Medical Director for [place] and Pulmonary Rehab.

## 2016-10-22 ENCOUNTER — Encounter (HOSPITAL_COMMUNITY)
Admission: RE | Admit: 2016-10-22 | Discharge: 2016-10-22 | Disposition: A | Discharge status: Home / Self Care | Payer: Medicare HMO | Source: Ambulatory Visit | Attending: Cardiology | Admitting: Cardiology

## 2016-10-22 DIAGNOSIS — E079 Disorder of thyroid, unspecified: Secondary | ICD-10-CM | POA: Insufficient documentation

## 2016-10-22 DIAGNOSIS — Z794 Long term (current) use of insulin: Secondary | ICD-10-CM | POA: Insufficient documentation

## 2016-10-22 DIAGNOSIS — Z7982 Long term (current) use of aspirin: Secondary | ICD-10-CM | POA: Diagnosis not present

## 2016-10-22 DIAGNOSIS — I110 Hypertensive heart disease with heart failure: Secondary | ICD-10-CM | POA: Diagnosis not present

## 2016-10-22 DIAGNOSIS — M311 Thrombotic microangiopathy: Secondary | ICD-10-CM | POA: Insufficient documentation

## 2016-10-22 DIAGNOSIS — E119 Type 2 diabetes mellitus without complications: Secondary | ICD-10-CM | POA: Diagnosis not present

## 2016-10-22 DIAGNOSIS — Z87891 Personal history of nicotine dependence: Secondary | ICD-10-CM | POA: Insufficient documentation

## 2016-10-22 DIAGNOSIS — M1990 Unspecified osteoarthritis, unspecified site: Secondary | ICD-10-CM | POA: Insufficient documentation

## 2016-10-22 DIAGNOSIS — I255 Ischemic cardiomyopathy: Secondary | ICD-10-CM | POA: Insufficient documentation

## 2016-10-22 DIAGNOSIS — Z6841 Body Mass Index (BMI) 40.0 and over, adult: Secondary | ICD-10-CM | POA: Insufficient documentation

## 2016-10-22 DIAGNOSIS — R413 Other amnesia: Secondary | ICD-10-CM | POA: Insufficient documentation

## 2016-10-22 DIAGNOSIS — I5022 Chronic systolic (congestive) heart failure: Secondary | ICD-10-CM | POA: Diagnosis not present

## 2016-10-22 DIAGNOSIS — Z8711 Personal history of peptic ulcer disease: Secondary | ICD-10-CM | POA: Insufficient documentation

## 2016-10-22 DIAGNOSIS — E669 Obesity, unspecified: Secondary | ICD-10-CM | POA: Diagnosis not present

## 2016-10-22 DIAGNOSIS — I2510 Atherosclerotic heart disease of native coronary artery without angina pectoris: Secondary | ICD-10-CM | POA: Diagnosis not present

## 2016-10-22 DIAGNOSIS — Z79899 Other long term (current) drug therapy: Secondary | ICD-10-CM | POA: Insufficient documentation

## 2016-10-22 NOTE — Progress Notes (Signed)
Daily Session Note  Patient Details  Name: AMARILIS BELFLOWER MRN: [id number] Date of Birth: Feb 26, 1951 Referring Provider:   Flowsheet Row CARDIAC REHAB PHASE II ORIENTATION from 10/14/2016 in [place]  Referring Provider  Dr. Harl Bowie      Encounter Date: 10/22/2016  Check In:     Session Check In - 10/22/16 1100      Check-In   Location [facility]   Staff Present Aundra Dubin, RN, BSN;Yanil Dawe Luther Parody, BS, EP, Exercise Physiologist   Supervising physician immediately available to respond to emergencies See telemetry face sheet for immediately available MD   Medication changes reported     No   Fall or balance concerns reported    No   Warm-up and Cool-down Performed as group-led instruction   Resistance Training Performed Yes   VAD Patient? No     Pain Assessment   Currently in Pain? No/denies   Pain Score 0-No pain   Multiple Pain Sites No      Capillary Blood Glucose: No results found for this or any previous visit (from the past 24 hour(s)).   Goals Met:  Independence with exercise equipment Exercise tolerated well No report of cardiac concerns or symptoms Strength training completed today  Goals Unmet:  Not Applicable  Comments: Check out 1200   Dr. Kate Sable is Medical Director for [place] and Pulmonary Rehab.

## 2016-10-25 ENCOUNTER — Encounter (HOSPITAL_COMMUNITY): Payer: Medicare HMO

## 2016-10-27 ENCOUNTER — Encounter (HOSPITAL_COMMUNITY)
Admission: RE | Admit: 2016-10-27 | Discharge: 2016-10-27 | Disposition: A | Discharge status: Home / Self Care | Payer: Medicare HMO | Source: Ambulatory Visit | Attending: Cardiology | Admitting: Cardiology

## 2016-10-27 DIAGNOSIS — Z794 Long term (current) use of insulin: Secondary | ICD-10-CM | POA: Diagnosis not present

## 2016-10-27 DIAGNOSIS — M1990 Unspecified osteoarthritis, unspecified site: Secondary | ICD-10-CM | POA: Diagnosis not present

## 2016-10-27 DIAGNOSIS — E119 Type 2 diabetes mellitus without complications: Secondary | ICD-10-CM | POA: Diagnosis not present

## 2016-10-27 DIAGNOSIS — Z79899 Other long term (current) drug therapy: Secondary | ICD-10-CM | POA: Diagnosis not present

## 2016-10-27 DIAGNOSIS — I5022 Chronic systolic (congestive) heart failure: Secondary | ICD-10-CM | POA: Diagnosis not present

## 2016-10-27 DIAGNOSIS — I255 Ischemic cardiomyopathy: Secondary | ICD-10-CM | POA: Diagnosis not present

## 2016-10-27 DIAGNOSIS — Z8711 Personal history of peptic ulcer disease: Secondary | ICD-10-CM | POA: Diagnosis not present

## 2016-10-27 DIAGNOSIS — I2510 Atherosclerotic heart disease of native coronary artery without angina pectoris: Secondary | ICD-10-CM | POA: Diagnosis not present

## 2016-10-27 DIAGNOSIS — I110 Hypertensive heart disease with heart failure: Secondary | ICD-10-CM | POA: Diagnosis not present

## 2016-10-27 NOTE — Progress Notes (Signed)
Daily Session Note  Patient Details  Name: [name] MRN: [id number] Date of Birth: 1951-06-01 Referring Provider:   Flowsheet Row CARDIAC REHAB PHASE II ORIENTATION from 10/14/2016 in [place]  Referring Provider  Dr. Harl Bowie      Encounter Date: 10/27/2016  Check In:     Session Check In - 10/27/16 1100      Check-In   Location [facility]   Staff Present Aundra Dubin, RN, BSN;Gonsalo Cuthbertson Luther Parody, BS, EP, Exercise Physiologist   Supervising physician immediately available to respond to emergencies See telemetry face sheet for immediately available MD   Medication changes reported     No   Fall or balance concerns reported    No   Warm-up and Cool-down Performed as group-led instruction   Resistance Training Performed Yes   VAD Patient? No     Pain Assessment   Currently in Pain? No/denies   Pain Score 0-No pain   Multiple Pain Sites No      Capillary Blood Glucose: No results found for this or any previous visit (from the past 24 hour(s)).   Goals Met:  Independence with exercise equipment Exercise tolerated well No report of cardiac concerns or symptoms Strength training completed today  Goals Unmet:  Not Applicable  Comments: Check out 1200   Dr. Kate Sable is Medical Director for [place] and Pulmonary Rehab.

## 2016-10-28 NOTE — Progress Notes (Signed)
Cardiac Individual Treatment Plan  Patient Details  Name: [name] MRN: [id number] Date of Birth: Dec 17, 1950 Referring Provider:   Flowsheet Row CARDIAC REHAB PHASE II ORIENTATION from 10/14/2016 in [facility]  Referring Provider  Dr. Harl Bowie      Initial Encounter Date:  Flowsheet Row CARDIAC REHAB PHASE II ORIENTATION from 10/14/2016 in [facility]  Date  10/14/16  Referring Provider  Dr. Harl Bowie      Visit Diagnosis: Chronic systolic CHF (congestive heart failure) ([place])  Patient's Home Medications on Admission:  Current Outpatient Prescriptions:  .  acetaminophen (TYLENOL) 500 MG tablet, Take 1,000 mg by mouth every 6 (six) hours as needed for moderate pain. , Disp: , Rfl:  .  aspirin 81 MG chewable tablet, Chew 1 tablet (81 mg total) by mouth daily., Disp: , Rfl:  .  atorvastatin (LIPITOR) 80 MG tablet, Take 1 tablet (80 mg total) by mouth daily at 6 PM., Disp: 90 tablet, Rfl: 3 .  carvedilol (COREG) 25 MG tablet, Take 1 tablet (25 mg total) by mouth 2 (two) times daily., Disp: 180 tablet, Rfl: 3 .  furosemide (LASIX) 40 MG tablet, TAKE ONE TABLET BY MOUTH DAILY., Disp: 90 tablet, Rfl: 0 .  insulin glargine (LANTUS) 100 UNIT/ML injection, Inject 40 Units into the skin 2 (two) times daily., Disp: , Rfl:  .  losartan (COZAAR) 50 MG tablet, Take 50 mg by mouth daily., Disp: , Rfl:  .  Multiple Vitamin (MULTIVITAMIN) tablet, Take 1 tablet by mouth daily., Disp: , Rfl:  .  NOVOLOG 100 UNIT/ML injection, Inject 10 Units into the skin 3 (three) times daily with meals. , Disp: , Rfl:  .  polyethylene glycol (MIRALAX / GLYCOLAX) packet, Take 17 g by mouth daily., Disp: , Rfl:  .  ranitidine (ZANTAC) 150 MG tablet, Take 150 mg by mouth 2 (two) times daily., Disp: , Rfl:  .  spironolactone (ALDACTONE) 25 MG tablet, Take 1 tablet (25 mg total) by mouth daily., Disp: 90 tablet, Rfl: 3  Past Medical History: Past Medical History:  Diagnosis  Date  . Anemia   . Arthritis   . CAD (coronary artery disease)    a. LHC (03/2014) [place]: nl, LAD: diff dz proximal 20%, 30-40% dz mid vessel prior 2nd diagonal, LCx: 40% in OM1, 20-30% in OM2, RCA: 99% subtotal occlusion @ crux, TIMI 1 flow, L-R collaterals to distal vessel  . Chronic systolic heart failure ([place])    a. ECHO (03/2014): EF 25-30%, akinesis enteroanteroseptal myocardium, grade III DD b. RHC (03/2014) RA 4, RV 42/4, PA 44/14 (26), PCWP 21, PA 62% Fick CO/CI 6.9 / 3.4  . Diabetes mellitus   . Duodenal ulcer    Age 66  . Erosive esophagitis   . Gastritis   . Hypertension   . Ischemic cardiomyopathy   . Obese   . Short-term memory loss   . Thyroid disease   . TTP (thrombotic thrombocytopenic purpura) (H[facility])     Tobacco Use: History  Smoking Status  . Former Smoker  . Packs/day: 0.25  . Years: 10.00  . Types: Cigarettes  . Start date: 09/20/1966  . Quit date: 09/20/1978  Smokeless Tobacco  . Never Used    Comment: Quit 2008    Labs: Recent Review Flowsheet Data    Labs for ITP Cardiac and Pulmonary Rehab Latest Ref Rng & Units 03/23/2014 03/24/2014 03/28/2014 03/28/2014 03/28/2014   Hemoglobin A1c <5.7 % - - - - -   PHART  7.350 - 7.450 7.393 - - 7.436 7.413   PCO2ART 35.0 - 45.0 mmHg 35.3 - - 43.8 40.7   HCO3 20.0 - 24.0 mEq/L 21.6 - 29.1(H) 29.4(H) 26.0(H)   TCO2 0 - 100 mmol/L 23 - 30 31 27    ACIDBASEDEF 0.0 - 2.0 mmol/L 3.0(H) - - - -   O2SAT % 94.0 68.5 62.0 84.0 90.0      Capillary Blood Glucose: Lab Results  Component Value Date   GLUCAP 101 (H) 06/14/2015   GLUCAP 83 05/28/2015   GLUCAP 156 (H) 04/15/2014   GLUCAP 107 (H) 04/15/2014   GLUCAP 73 04/14/2014     Exercise Target Goals:    Exercise Program Goal: Individual exercise prescription set with THRR, safety & activity barriers. Participant demonstrates ability to understand and report RPE using BORG scale, to self-measure pulse accurately, and to acknowledge the importance of the exercise  prescription.  Exercise Prescription Goal: Starting with aerobic activity 30 plus minutes a day, 3 days per week for initial exercise prescription. Provide home exercise prescription and guidelines that participant acknowledges understanding prior to discharge.  Activity Barriers & Risk Stratification:     Activity Barriers & Cardiac Risk Stratification - 10/14/16 1501      Activity Barriers & Cardiac Risk Stratification   Activity Barriers None   Cardiac Risk Stratification High      6 Minute Walk:     6 Minute Walk    Row Name 10/14/16 1458         6 Minute Walk   Phase Initial     Distance 700 feet  Patient had to stop and rest for 30 seconds     Distance % Change 0 %     Walk Time 6 minutes     # of Rest Breaks 0     MPH 1.3     METS 2.01     RPE 15     Perceived Dyspnea  13     VO2 Peak 4.08     Symptoms No     Resting HR 86 bpm     Resting BP 88/62     Max Ex. HR 98 bpm     Max Ex. BP 120/66     2 Minute Post BP 94/60        Initial Exercise Prescription:     Initial Exercise Prescription - 10/14/16 1400      Date of Initial Exercise RX and Referring Provider   Date 10/14/16   Referring Provider Dr. Alyson Reedy   Level 2   Watts 10   Minutes 20   METs 1.6     Arm Ergometer   Level 2   Watts 19   Minutes 15   METs 2.5     Prescription Details   Frequency (times per week) 3   Duration Progress to 30 minutes of continuous aerobic without signs/symptoms of physical distress     Intensity   THRR REST +  30   THRR 40-80% of Max Heartrate 113-126-140   Ratings of Perceived Exertion 11-13   Perceived Dyspnea 0-4     Progression   Progression Continue progressive overload as per policy without signs/symptoms or physical distress.     Resistance Training   Training Prescription Yes   Weight 1   Reps 10-12      Perform Capillary Blood Glucose checks as needed.  Exercise Prescription Changes:      Exercise Prescription  Changes  [place] Name 10/25/16 1400             Exercise Review   Progression No         Response to Exercise   Blood Pressure (Admit) 100/50       Blood Pressure (Exercise) 110/66       Blood Pressure (Exit) 94/60       Heart Rate (Admit) 84 bpm       Heart Rate (Exercise) 94 bpm       Heart Rate (Exit) 90 bpm       Rating of Perceived Exertion (Exercise) 14       Duration Progress to 30 minutes of continuous aerobic without signs/symptoms of physical distress       Intensity Rest + 30         Progression   Progression Continue progressive overload as per policy without signs/symptoms or physical distress.         Resistance Training   Training Prescription Yes       Weight 1       Reps 10-12         NuStep   Level 2       Watts 14       Minutes 15       METs 3.71         Arm Ergometer   Level 1.5       Watts 2       Minutes 20       METs 1.5         Home Exercise Plan   Plans to continue exercise at Home       Frequency Add 2 additional days to program exercise sessions.          Exercise Comments:      Exercise Comments    Row Name 10/25/16 1441           Exercise Comments Patient has only recently began CR and will be progressed in time.            Discharge Exercise Prescription (Final Exercise Prescription Changes):     Exercise Prescription Changes - 10/25/16 1400      Exercise Review   Progression No     Response to Exercise   Blood Pressure (Admit) 100/50   Blood Pressure (Exercise) 110/66   Blood Pressure (Exit) 94/60   Heart Rate (Admit) 84 bpm   Heart Rate (Exercise) 94 bpm   Heart Rate (Exit) 90 bpm   Rating of Perceived Exertion (Exercise) 14   Duration Progress to 30 minutes of continuous aerobic without signs/symptoms of physical distress   Intensity Rest + 30     Progression   Progression Continue progressive overload as per policy without signs/symptoms or physical distress.     Resistance Training   Training  Prescription Yes   Weight 1   Reps 10-12     NuStep   Level 2   Watts 14   Minutes 15   METs 3.71     Arm Ergometer   Level 1.5   Watts 2   Minutes 20   METs 1.5     Home Exercise Plan   Plans to continue exercise at Home   Frequency Add 2 additional days to program exercise sessions.      Nutrition:  Target Goals: Understanding of nutrition guidelines, daily intake of sodium 1500mg , cholesterol 200mg , calories 30% from fat and 7% or less from saturated fats, daily to  have 5 or more servings of fruits and vegetables.  Biometrics:     Pre Biometrics - 10/14/16 1501      Pre Biometrics   Height 5\' 5"  (1.651 m)   Weight 244 lb 4.3 oz (110.8 kg)   Waist Circumference 47.5 inches   Hip Circumference 50.5 inches   Waist to Hip Ratio 0.94 %   BMI (Calculated) 40.7   Triceps Skinfold 26 mm   % Body Fat 49.7 %   Grip Strength 51.33 kg   Flexibility 0 in   Single Leg Stand 7 seconds       Nutrition Therapy Plan and Nutrition Goals:   Nutrition Discharge: Rate Your Plate Scores:     Nutrition Assessments - 10/14/16 1522      MEDFICTS Scores   Pre Score 50      Nutrition Goals Re-Evaluation:   Psychosocial: Target Goals: Acknowledge presence or absence of depression, maximize coping skills, provide positive support system. Participant is able to verbalize types and ability to use techniques and skills needed for reducing stress and depression.  Initial Review & Psychosocial Screening:     Initial Psych Review & Screening - 10/14/16 1530      Initial Review   Current issues with Current Sleep Concerns     Family Dynamics   Good Support System? Yes     Barriers   Psychosocial barriers to participate in program There are no identifiable barriers or psychosocial needs.     Screening Interventions   Interventions Encouraged to exercise      Quality of Life Scores:     Quality of Life - 10/14/16 1502      Quality of Life Scores    Health/Function Pre 17.1 %   Socioeconomic Pre 24.13 %   Psych/Spiritual Pre 24 %   Family Pre 23.7 %   GLOBAL Pre 21.03 %      PHQ-9: Recent Review Flowsheet Data    Depression screen [facility] 2/9 10/14/2016 07/19/2016 01/30/2015 01/01/2015   Decreased Interest 0 0 0 0   Down, Depressed, Hopeless 0 0 0 0    PHQ - 2 Score 0 0 0 0   Altered sleeping 1 - - -   Tired, decreased energy 2 - - -   Change in appetite 2 - - -   Feeling bad or failure about yourself  0 - - -   Trouble concentrating 2 - - -   Moving slowly or fidgety/restless 0 - - -   Suicidal thoughts 0 - - -   PHQ-9 Score 7 - - -   Difficult doing work/chores Somewhat difficult - - -      Psychosocial Evaluation and Intervention:     Psychosocial Evaluation - 10/14/16 1530      Psychosocial Evaluation & Interventions   Interventions Encouraged to exercise with the program and follow exercise prescription   Continued Psychosocial Services Needed No      Psychosocial Re-Evaluation:     Psychosocial Re-Evaluation    Row Name 10/28/16 0932             Psychosocial Re-Evaluation   Interventions Encouraged to attend Cardiac Rehabilitation for the exercise       Comments Patient's QOL score was 21.03 and her PHQ-9 score was 7. She does not feel she is depressed and does not feel she need counseling.        Continued Psychosocial Services Needed No          Vocational  Rehabilitation: Provide vocational rehab assistance to qualifying candidates.   Vocational Rehab Evaluation & Intervention:     Vocational Rehab - 10/14/16 1502      Initial Vocational Rehab Evaluation & Intervention   Assessment shows need for Vocational Rehabilitation No      Education: Education Goals: Education classes will be provided on a weekly basis, covering required topics. Participant will state understanding/return demonstration of topics presented.  Learning Barriers/Preferences:     Learning Barriers/Preferences - 10/14/16  1501      Learning Barriers/Preferences   Learning Preferences Pictoral;Video;Written Material      Education Topics: Hypertension, Hypertension Reduction -Define heart disease and high blood pressure. Discus how high blood pressure affects the body and ways to reduce high blood pressure. Flowsheet Row CARDIAC REHAB PHASE II EXERCISE from 10/27/2016 in [facility]  Date  10/20/16  Educator  DC  Instruction Review Code  2- meets goals/outcomes      Exercise and Your Heart -Discuss why it is important to exercise, the FITT principles of exercise, normal and abnormal responses to exercise, and how to exercise safely. Flowsheet Row CARDIAC REHAB PHASE II EXERCISE from 10/27/2016 in [facility]  Date  10/27/16  Educator  DC  Instruction Review Code  2- meets goals/outcomes      Angina -Discuss definition of angina, causes of angina, treatment of angina, and how to decrease risk of having angina.   Cardiac Medications -Review what the following cardiac medications are used for, how they affect the body, and side effects that may occur when taking the medications.  Medications include Aspirin, Beta blockers, calcium channel blockers, ACE Inhibitors, angiotensin receptor blockers, diuretics, digoxin, and antihyperlipidemics.   Congestive Heart Failure -Discuss the definition of CHF, how to live with CHF, the signs and symptoms of CHF, and how keep track of weight and sodium intake.   Heart Disease and Intimacy -Discus the effect sexual activity has on the heart, how changes occur during intimacy as we age, and safety during sexual activity.   Smoking Cessation / COPD -Discuss different methods to quit smoking, the health benefits of quitting smoking, and the definition of COPD.   Nutrition I: Fats -Discuss the types of cholesterol, what cholesterol does to the heart, and how cholesterol levels can be controlled.   Nutrition II:  Labels -Discuss the different components of food labels and how to read food label   Heart Parts and Heart Disease -Discuss the anatomy of the heart, the pathway of blood circulation through the heart, and these are affected by heart disease.   Stress I: Signs and Symptoms -Discuss the causes of stress, how stress may lead to anxiety and depression, and ways to limit stress.   Stress II: Relaxation -Discuss different types of relaxation techniques to limit stress.   Warning Signs of Stroke / TIA -Discuss definition of a stroke, what the signs and symptoms are of a stroke, and how to identify when someone is having stroke.   Knowledge Questionnaire Score:     Knowledge Questionnaire Score - 10/14/16 1502      Knowledge Questionnaire Score   Pre Score 16/24      Core Components/Risk Factors/Patient Goals at Admission:     Personal Goals and Risk Factors at Admission - 10/14/16 1522      Core Components/Risk Factors/Patient Goals on Admission    Weight Management Yes   Admit Weight 244 lb 8 oz (110.9 kg)   Goal Weight: Short Term 234  lb 8 oz (106.4 kg)   Goal Weight: Long Term 224 lb 8 oz (101.8 kg)   Expected Outcomes Short Term: Continue to assess and modify interventions until short term weight is achieved;Long Term: Adherence to nutrition and physical activity/exercise program aimed toward attainment of established weight goal   Sedentary Yes   Intervention Provide advice, education, support and counseling about physical activity/exercise needs.;Develop an individualized exercise prescription for aerobic and resistive training based on initial evaluation findings, risk stratification, comorbidities and participant's personal goals.   Expected Outcomes Achievement of increased cardiorespiratory fitness and enhanced flexibility, muscular endurance and strength shown through measurements of functional capacity and personal statement of participant.   Increase Strength and  Stamina Yes   Intervention Provide advice, education, support and counseling about physical activity/exercise needs.;Develop an individualized exercise prescription for aerobic and resistive training based on initial evaluation findings, risk stratification, comorbidities and participant's personal goals.   Expected Outcomes Achievement of increased cardiorespiratory fitness and enhanced flexibility, muscular endurance and strength shown through measurements of functional capacity and personal statement of participant.   Diabetes Yes   Intervention Provide education about signs/symptoms and action to take for hypo/hyperglycemia.;Provide education about proper nutrition, including hydration, and aerobic/resistive exercise prescription along with prescribed medications to achieve blood glucose in normal ranges: Fasting glucose 65-99 mg/dL   Expected Outcomes Short Term: Participant verbalizes understanding of the signs/symptoms and immediate care of hyper/hypoglycemia, proper foot care and importance of medication, aerobic/resistive exercise and nutrition plan for blood glucose control.;Long Term: Attainment of HbA1C < 7%.   Heart Failure Yes   Intervention Provide a combined exercise and nutrition program that is supplemented with education, support and counseling about heart failure. Directed toward relieving symptoms such as shortness of breath, decreased exercise tolerance, and extremity edema.   Expected Outcomes Long term: Adoption of self-care skills and reduction of barriers for early signs and symptoms recognition and intervention leading to self-care maintenance.;Short term: Attendance in program 2-3 days a week with increased exercise capacity. Reported lower sodium intake. Reported increased fruit and vegetable intake. Reports medication compliance.   Personal Goal Other Yes   Personal Goal Get around more, Get stronger, lose weight   Intervention Attend CR 3xweek and supplement with 2xweek at  home   Expected Outcomes Reach personal goals.       Core Components/Risk Factors/Patient Goals Review:      Goals and Risk Factor Review    Row Name 10/14/16 1529 10/28/16 0928           Core Components/Risk Factors/Patient Goals Review   Personal Goals Review Weight Management/Obesity;Sedentary;Increase Strength and Stamina;Heart Failure;Diabetes Weight Management/Obesity;Sedentary;Increase Strength and Stamina;Diabetes  Get around more; Get stronger; lose 50 lbs.       Review  - Patient has attended 5 sessions. Will continue to monitor for progress.       Expected Outcomes  - Patient will continue to attend sessions meeting her personal goals.          Core Components/Risk Factors/Patient Goals at Discharge (Final Review):      Goals and Risk Factor Review - 10/28/16 0928      Core Components/Risk Factors/Patient Goals Review   Personal Goals Review Weight Management/Obesity;Sedentary;Increase Strength and Stamina;Diabetes  Get around more; Get stronger; lose 50 lbs.    Review Patient has attended 5 sessions. Will continue to monitor for progress.    Expected Outcomes Patient will continue to attend sessions meeting her personal goals.       ITP  Comments:   Comments: ITP 30 REVIEW Patient new to program. She has attended 5 sessions. Will continue to monitor for progress.

## 2016-10-29 ENCOUNTER — Encounter (HOSPITAL_COMMUNITY)
Admission: RE | Admit: 2016-10-29 | Discharge: 2016-10-29 | Disposition: A | Discharge status: Home / Self Care | Payer: Medicare HMO | Source: Ambulatory Visit | Attending: Cardiology | Admitting: Cardiology

## 2016-10-29 DIAGNOSIS — I255 Ischemic cardiomyopathy: Secondary | ICD-10-CM | POA: Diagnosis not present

## 2016-10-29 DIAGNOSIS — I110 Hypertensive heart disease with heart failure: Secondary | ICD-10-CM | POA: Diagnosis not present

## 2016-10-29 DIAGNOSIS — I5022 Chronic systolic (congestive) heart failure: Secondary | ICD-10-CM

## 2016-10-29 DIAGNOSIS — E119 Type 2 diabetes mellitus without complications: Secondary | ICD-10-CM | POA: Diagnosis not present

## 2016-10-29 DIAGNOSIS — M1990 Unspecified osteoarthritis, unspecified site: Secondary | ICD-10-CM | POA: Diagnosis not present

## 2016-10-29 DIAGNOSIS — Z8711 Personal history of peptic ulcer disease: Secondary | ICD-10-CM | POA: Diagnosis not present

## 2016-10-29 DIAGNOSIS — Z79899 Other long term (current) drug therapy: Secondary | ICD-10-CM | POA: Diagnosis not present

## 2016-10-29 DIAGNOSIS — I2510 Atherosclerotic heart disease of native coronary artery without angina pectoris: Secondary | ICD-10-CM | POA: Diagnosis not present

## 2016-10-29 DIAGNOSIS — Z794 Long term (current) use of insulin: Secondary | ICD-10-CM | POA: Diagnosis not present

## 2016-10-29 NOTE — Progress Notes (Signed)
Daily Session Note  Patient Details  Name: [name] MRN: [id number] Date of Birth: October 18, 1950 Referring Provider:   Flowsheet Row CARDIAC REHAB PHASE II ORIENTATION from 10/14/2016 in [place]  Referring Provider  Dr. Harl Bowie      Encounter Date: 10/29/2016  Check In:     Session Check In - 10/29/16 1100      Check-In   Location [facility]   Staff Present Diane Angelina Pih, MS, EP, [facility], Exercise Physiologist;Marlin Brys Luther Parody, BS, EP, Exercise Physiologist   Supervising physician immediately available to respond to emergencies See telemetry face sheet for immediately available MD   Medication changes reported     No   Fall or balance concerns reported    No   Warm-up and Cool-down Performed as group-led instruction   Resistance Training Performed Yes   VAD Patient? No     Pain Assessment   Currently in Pain? No/denies   Pain Score 0-No pain   Multiple Pain Sites No      Capillary Blood Glucose: No results found for this or any previous visit (from the past 24 hour(s)).   Goals Met:  Independence with exercise equipment Exercise tolerated well No report of cardiac concerns or symptoms Strength training completed today  Goals Unmet:  Not Applicable  Comments: Check out 1200   Dr. Kate Sable is Medical Director for [place] and Pulmonary Rehab.

## 2016-11-01 ENCOUNTER — Encounter (HOSPITAL_COMMUNITY)
Admission: RE | Admit: 2016-11-01 | Discharge: 2016-11-01 | Disposition: A | Discharge status: Home / Self Care | Payer: Medicare HMO | Source: Ambulatory Visit | Attending: Cardiology | Admitting: Cardiology

## 2016-11-01 DIAGNOSIS — I110 Hypertensive heart disease with heart failure: Secondary | ICD-10-CM | POA: Diagnosis not present

## 2016-11-01 DIAGNOSIS — I255 Ischemic cardiomyopathy: Secondary | ICD-10-CM | POA: Diagnosis not present

## 2016-11-01 DIAGNOSIS — M1990 Unspecified osteoarthritis, unspecified site: Secondary | ICD-10-CM | POA: Diagnosis not present

## 2016-11-01 DIAGNOSIS — Z8711 Personal history of peptic ulcer disease: Secondary | ICD-10-CM | POA: Diagnosis not present

## 2016-11-01 DIAGNOSIS — I5022 Chronic systolic (congestive) heart failure: Secondary | ICD-10-CM | POA: Diagnosis not present

## 2016-11-01 DIAGNOSIS — Z794 Long term (current) use of insulin: Secondary | ICD-10-CM | POA: Diagnosis not present

## 2016-11-01 DIAGNOSIS — I2510 Atherosclerotic heart disease of native coronary artery without angina pectoris: Secondary | ICD-10-CM | POA: Diagnosis not present

## 2016-11-01 DIAGNOSIS — E119 Type 2 diabetes mellitus without complications: Secondary | ICD-10-CM | POA: Diagnosis not present

## 2016-11-01 DIAGNOSIS — Z79899 Other long term (current) drug therapy: Secondary | ICD-10-CM | POA: Diagnosis not present

## 2016-11-01 NOTE — Progress Notes (Signed)
Daily Session Note  Patient Details  Name: [name] MRN: [id number] Date of Birth: [birth year] Referring Provider:   Flowsheet Row CARDIAC REHAB PHASE II ORIENTATION from 10/14/2016 in [place]  Referring Provider  Dr. Harl Bowie      Encounter Date: 11/01/2016  Check In:     Session Check In - 11/01/16 1054      Check-In   Location [facility]   Staff Present Suzanne Boron, BS, EP, Exercise Physiologist;Debra Wynetta Emery, RN, BSN   Supervising physician immediately available to respond to emergencies See telemetry face sheet for immediately available MD   Medication changes reported     No   Fall or balance concerns reported    No   Warm-up and Cool-down Performed as group-led instruction   Resistance Training Performed Yes   VAD Patient? No     Pain Assessment   Currently in Pain? No/denies   Pain Score 0-No pain   Multiple Pain Sites No      Capillary Blood Glucose: No results found for this or any previous visit (from the past 24 hour(s)).   Goals Met:  Independence with exercise equipment Exercise tolerated well No report of cardiac concerns or symptoms Strength training completed today  Goals Unmet:  Not Applicable  Comments: Check out 1200   Dr. Kate Sable is Medical Director for [place] and Pulmonary Rehab.

## 2016-11-03 ENCOUNTER — Encounter (HOSPITAL_COMMUNITY)
Admission: RE | Admit: 2016-11-03 | Discharge: 2016-11-03 | Disposition: A | Discharge status: Home / Self Care | Payer: Medicare HMO | Source: Ambulatory Visit | Attending: Cardiology | Admitting: Cardiology

## 2016-11-03 DIAGNOSIS — M1990 Unspecified osteoarthritis, unspecified site: Secondary | ICD-10-CM | POA: Diagnosis not present

## 2016-11-03 DIAGNOSIS — E119 Type 2 diabetes mellitus without complications: Secondary | ICD-10-CM | POA: Diagnosis not present

## 2016-11-03 DIAGNOSIS — I5022 Chronic systolic (congestive) heart failure: Secondary | ICD-10-CM

## 2016-11-03 DIAGNOSIS — Z794 Long term (current) use of insulin: Secondary | ICD-10-CM | POA: Diagnosis not present

## 2016-11-03 DIAGNOSIS — I2510 Atherosclerotic heart disease of native coronary artery without angina pectoris: Secondary | ICD-10-CM | POA: Diagnosis not present

## 2016-11-03 DIAGNOSIS — Z8711 Personal history of peptic ulcer disease: Secondary | ICD-10-CM | POA: Diagnosis not present

## 2016-11-03 DIAGNOSIS — I255 Ischemic cardiomyopathy: Secondary | ICD-10-CM | POA: Diagnosis not present

## 2016-11-03 DIAGNOSIS — Z79899 Other long term (current) drug therapy: Secondary | ICD-10-CM | POA: Diagnosis not present

## 2016-11-03 DIAGNOSIS — I110 Hypertensive heart disease with heart failure: Secondary | ICD-10-CM | POA: Diagnosis not present

## 2016-11-03 NOTE — Progress Notes (Signed)
Daily Session Note  Patient Details  Name: NEAVEH BELANGER MRN: [id number] Date of Birth: [birth year] Referring Provider:   Flowsheet Row CARDIAC REHAB PHASE II ORIENTATION from 10/14/2016 in [place]  Referring Provider  Dr. Harl Bowie      Encounter Date: 11/03/2016  Check In:     Session Check In - 11/03/16 1100      Check-In   Location [facility]   Staff Present Aundra Dubin, RN, BSN;Zaiyah Sottile Luther Parody, BS, EP, Exercise Physiologist   Supervising physician immediately available to respond to emergencies See telemetry face sheet for immediately available MD   Medication changes reported     No   Fall or balance concerns reported    No   Warm-up and Cool-down Performed as group-led instruction   Resistance Training Performed Yes   VAD Patient? No     Pain Assessment   Currently in Pain? No/denies   Pain Score 0-No pain   Multiple Pain Sites No      Capillary Blood Glucose: No results found for this or any previous visit (from the past 24 hour(s)).   Goals Met:  Independence with exercise equipment Exercise tolerated well No report of cardiac concerns or symptoms Strength training completed today  Goals Unmet:  Not Applicable  Comments: Check out 1200   Dr. Kate Sable is Medical Director for [place] and Pulmonary Rehab.

## 2016-11-05 ENCOUNTER — Encounter (HOSPITAL_COMMUNITY)
Admission: RE | Admit: 2016-11-05 | Discharge: 2016-11-05 | Disposition: A | Discharge status: Home / Self Care | Payer: Medicare HMO | Source: Ambulatory Visit | Attending: Cardiology | Admitting: Cardiology

## 2016-11-05 DIAGNOSIS — I5022 Chronic systolic (congestive) heart failure: Secondary | ICD-10-CM

## 2016-11-05 DIAGNOSIS — Z8711 Personal history of peptic ulcer disease: Secondary | ICD-10-CM | POA: Diagnosis not present

## 2016-11-05 DIAGNOSIS — E119 Type 2 diabetes mellitus without complications: Secondary | ICD-10-CM | POA: Diagnosis not present

## 2016-11-05 DIAGNOSIS — I110 Hypertensive heart disease with heart failure: Secondary | ICD-10-CM | POA: Diagnosis not present

## 2016-11-05 DIAGNOSIS — Z79899 Other long term (current) drug therapy: Secondary | ICD-10-CM | POA: Diagnosis not present

## 2016-11-05 DIAGNOSIS — M1990 Unspecified osteoarthritis, unspecified site: Secondary | ICD-10-CM | POA: Diagnosis not present

## 2016-11-05 DIAGNOSIS — I255 Ischemic cardiomyopathy: Secondary | ICD-10-CM | POA: Diagnosis not present

## 2016-11-05 DIAGNOSIS — I2510 Atherosclerotic heart disease of native coronary artery without angina pectoris: Secondary | ICD-10-CM | POA: Diagnosis not present

## 2016-11-05 DIAGNOSIS — Z794 Long term (current) use of insulin: Secondary | ICD-10-CM | POA: Diagnosis not present

## 2016-11-05 NOTE — Progress Notes (Signed)
Daily Session Note  Patient Details  Name: [name] MRN: [id number] Date of Birth: [birth year] Referring Provider:   Flowsheet Row CARDIAC REHAB PHASE II ORIENTATION from 10/14/2016 in [place] [facility]  Referring Provider  Dr. Harl Bowie      Encounter Date: 11/05/2016  Check In:     Session Check In - 11/05/16 1100      Check-In   Location [facility]   Staff Present Aundra Dubin, RN, BSN;Tonesha Tsou Luther Parody, BS, EP, Exercise Physiologist   Supervising physician immediately available to respond to emergencies See telemetry face sheet for immediately available MD   Medication changes reported     No   Fall or balance concerns reported    No   Warm-up and Cool-down Performed as group-led instruction   Resistance Training Performed Yes   VAD Patient? No     Pain Assessment   Currently in Pain? No/denies   Pain Score 0-No pain   Multiple Pain Sites No      Capillary Blood Glucose: No results found for this or any previous visit (from the past 24 hour(s)).   Goals Met:  Independence with exercise equipment Exercise tolerated well No report of cardiac concerns or symptoms Strength training completed today  Goals Unmet:  Not Applicable  Comments: Check out 1200   Dr. Kate Sable is Medical Director for [place] and Pulmonary Rehab.

## 2016-11-08 ENCOUNTER — Encounter (HOSPITAL_COMMUNITY)
Admission: RE | Admit: 2016-11-08 | Discharge: 2016-11-08 | Disposition: A | Discharge status: Home / Self Care | Payer: Medicare HMO | Source: Ambulatory Visit | Attending: Cardiology | Admitting: Cardiology

## 2016-11-08 DIAGNOSIS — I110 Hypertensive heart disease with heart failure: Secondary | ICD-10-CM | POA: Diagnosis not present

## 2016-11-08 DIAGNOSIS — M1990 Unspecified osteoarthritis, unspecified site: Secondary | ICD-10-CM | POA: Diagnosis not present

## 2016-11-08 DIAGNOSIS — I5022 Chronic systolic (congestive) heart failure: Secondary | ICD-10-CM

## 2016-11-08 DIAGNOSIS — E119 Type 2 diabetes mellitus without complications: Secondary | ICD-10-CM | POA: Diagnosis not present

## 2016-11-08 DIAGNOSIS — I2510 Atherosclerotic heart disease of native coronary artery without angina pectoris: Secondary | ICD-10-CM | POA: Diagnosis not present

## 2016-11-08 DIAGNOSIS — Z794 Long term (current) use of insulin: Secondary | ICD-10-CM | POA: Diagnosis not present

## 2016-11-08 DIAGNOSIS — Z8711 Personal history of peptic ulcer disease: Secondary | ICD-10-CM | POA: Diagnosis not present

## 2016-11-08 DIAGNOSIS — I255 Ischemic cardiomyopathy: Secondary | ICD-10-CM | POA: Diagnosis not present

## 2016-11-08 DIAGNOSIS — Z79899 Other long term (current) drug therapy: Secondary | ICD-10-CM | POA: Diagnosis not present

## 2016-11-08 NOTE — Progress Notes (Signed)
Daily Session Note  Patient Details  Name: [name] MRN: [id number] Date of Birth: 04/24/1951 Referring Provider:   Flowsheet Row CARDIAC REHAB PHASE II ORIENTATION from 10/14/2016 in [facility]  Referring Provider  Dr. Harl Bowie      Encounter Date: 11/08/2016  Check In:     Session Check In - 11/08/16 1100      Check-In   Location [facility]   Staff Present Aundra Dubin, RN, BSN;Blair Mesina Luther Parody, BS, EP, Exercise Physiologist   Supervising physician immediately available to respond to emergencies See telemetry face sheet for immediately available MD   Medication changes reported     No   Fall or balance concerns reported    No   Warm-up and Cool-down Performed as group-led instruction   Resistance Training Performed Yes   VAD Patient? No     Pain Assessment   Currently in Pain? No/denies   Pain Score 0-No pain   Multiple Pain Sites No      Capillary Blood Glucose: No results found for this or any previous visit (from the past 24 hour(s)).   Goals Met:  Independence with exercise equipment Exercise tolerated well No report of cardiac concerns or symptoms Strength training completed today  Goals Unmet:  Not Applicable  Comments: Check out 1200   Dr. Kate Sable is Medical Director for [place] and [facility] Re[facility].

## 2016-11-10 ENCOUNTER — Encounter (HOSPITAL_COMMUNITY): Admission: RE | Admit: 2016-11-10 | Payer: Medicare HMO | Source: Ambulatory Visit

## 2016-11-12 ENCOUNTER — Encounter (HOSPITAL_COMMUNITY): Payer: Medicare HMO

## 2016-11-15 ENCOUNTER — Encounter (HOSPITAL_COMMUNITY)
Admission: RE | Admit: 2016-11-15 | Discharge: 2016-11-15 | Disposition: A | Discharge status: Home / Self Care | Payer: Medicare HMO | Source: Ambulatory Visit | Attending: Cardiology | Admitting: Cardiology

## 2016-11-15 DIAGNOSIS — Z8711 Personal history of peptic ulcer disease: Secondary | ICD-10-CM | POA: Diagnosis not present

## 2016-11-15 DIAGNOSIS — I5022 Chronic systolic (congestive) heart failure: Secondary | ICD-10-CM

## 2016-11-15 DIAGNOSIS — E119 Type 2 diabetes mellitus without complications: Secondary | ICD-10-CM | POA: Diagnosis not present

## 2016-11-15 DIAGNOSIS — Z79899 Other long term (current) drug therapy: Secondary | ICD-10-CM | POA: Diagnosis not present

## 2016-11-15 DIAGNOSIS — Z794 Long term (current) use of insulin: Secondary | ICD-10-CM | POA: Diagnosis not present

## 2016-11-15 DIAGNOSIS — I2510 Atherosclerotic heart disease of native coronary artery without angina pectoris: Secondary | ICD-10-CM | POA: Diagnosis not present

## 2016-11-15 DIAGNOSIS — I110 Hypertensive heart disease with heart failure: Secondary | ICD-10-CM | POA: Diagnosis not present

## 2016-11-15 DIAGNOSIS — M1990 Unspecified osteoarthritis, unspecified site: Secondary | ICD-10-CM | POA: Diagnosis not present

## 2016-11-15 DIAGNOSIS — I255 Ischemic cardiomyopathy: Secondary | ICD-10-CM | POA: Diagnosis not present

## 2016-11-15 NOTE — Progress Notes (Signed)
Daily Session Note  Patient Details  Name: [name] MRN: [id number] Date of Birth: [birth year] Referring Provider:   Flowsheet Row CARDIAC REHAB PHASE II ORIENTATION from 10/14/2016 in [facility]  Referring Provider  Dr. Harl Bowie      Encounter Date: 11/15/2016  Check In:     Session Check In - 11/15/16 1100      Check-In   Location [facility]   Staff Present Aundra Dubin, RN, BSN;Graycee Greeson Luther Parody, BS, EP, Exercise Physiologist   Supervising physician immediately available to respond to emergencies See telemetry face sheet for immediately available MD   Medication changes reported     No   Fall or balance concerns reported    No   Warm-up and Cool-down Performed as group-led instruction   Resistance Training Performed Yes   VAD Patient? No     Pain Assessment   Currently in Pain? No/denies   Pain Score 0-No pain   Multiple Pain Sites No      Capillary Blood Glucose: No results found for this or any previous visit (from the past 24 hour(s)).    History  Smoking Status  . Former Smoker  . Packs/day: 0.25  . Years: 10.00  . Types: Cigarettes  . Start date: 09/20/1966  . Quit date: 09/20/1978  Smokeless Tobacco  . Never Used    Comment: Quit 2008    Goals Met:  Independence with exercise equipment Exercise tolerated well No report of cardiac concerns or symptoms Strength training completed today  Goals Unmet:  Not Applicable  Comments: Check out 1200   Dr. Kate Sable is Medical Director for [place] and [facility].

## 2016-11-17 ENCOUNTER — Encounter (HOSPITAL_COMMUNITY)
Admission: RE | Admit: 2016-11-17 | Discharge: 2016-11-17 | Disposition: A | Discharge status: Home / Self Care | Payer: Medicare HMO | Source: Ambulatory Visit | Attending: Cardiology | Admitting: Cardiology

## 2016-11-17 DIAGNOSIS — I2510 Atherosclerotic heart disease of native coronary artery without angina pectoris: Secondary | ICD-10-CM | POA: Diagnosis not present

## 2016-11-17 DIAGNOSIS — Z794 Long term (current) use of insulin: Secondary | ICD-10-CM | POA: Diagnosis not present

## 2016-11-17 DIAGNOSIS — E119 Type 2 diabetes mellitus without complications: Secondary | ICD-10-CM | POA: Diagnosis not present

## 2016-11-17 DIAGNOSIS — Z8711 Personal history of peptic ulcer disease: Secondary | ICD-10-CM | POA: Diagnosis not present

## 2016-11-17 DIAGNOSIS — I110 Hypertensive heart disease with heart failure: Secondary | ICD-10-CM | POA: Diagnosis not present

## 2016-11-17 DIAGNOSIS — Z79899 Other long term (current) drug therapy: Secondary | ICD-10-CM | POA: Diagnosis not present

## 2016-11-17 DIAGNOSIS — I5022 Chronic systolic (congestive) heart failure: Secondary | ICD-10-CM | POA: Diagnosis not present

## 2016-11-17 DIAGNOSIS — I255 Ischemic cardiomyopathy: Secondary | ICD-10-CM | POA: Diagnosis not present

## 2016-11-17 DIAGNOSIS — M1990 Unspecified osteoarthritis, unspecified site: Secondary | ICD-10-CM | POA: Diagnosis not present

## 2016-11-17 NOTE — Progress Notes (Signed)
Daily Session Note  Patient Details  Name: BIVIANA SADDLER MRN: [id number] Date of Birth: [birth year] Referring Provider:   Flowsheet Row CARDIAC REHAB PHASE II ORIENTATION from 10/14/2016 in [facility]  Referring Provider  Dr. Harl Bowie      Encounter Date: 11/17/2016  Check In:     Session Check In - 11/17/16 1100      Check-In   Location [facility]   Staff Present Suzanne Boron, BS, EP, Exercise Physiologist;Debra Wynetta Emery, RN, BSN   Supervising physician immediately available to respond to emergencies See telemetry face sheet for immediately available MD   Medication changes reported     No   Fall or balance concerns reported    No   Warm-up and Cool-down Performed as group-led instruction   Resistance Training Performed Yes   VAD Patient? No     Pain Assessment   Currently in Pain? No/denies   Pain Score 0-No pain   Multiple Pain Sites No      Capillary Blood Glucose: No results found for this or any previous visit (from the past 24 hour(s)).    History  Smoking Status  . Former Smoker  . Packs/day: 0.25  . Years: 10.00  . Types: Cigarettes  . Start date: 09/20/1966  . Quit date: 09/20/1978  Smokeless Tobacco  . Never Used    Comment: Quit 2008    Goals Met:  Independence with exercise equipment Exercise tolerated well No report of cardiac concerns or symptoms Strength training completed today  Goals Unmet:  Not Applicable  Comments: Check out 1200   Dr. Kate Sable is Medical Director for [place] and [facility].

## 2016-11-19 ENCOUNTER — Encounter (HOSPITAL_COMMUNITY): Payer: Medicare HMO

## 2016-11-22 ENCOUNTER — Encounter (HOSPITAL_COMMUNITY)
Admission: RE | Admit: 2016-11-22 | Discharge: 2016-11-22 | Disposition: A | Discharge status: Home / Self Care | Payer: Medicare HMO | Source: Ambulatory Visit | Attending: Cardiology | Admitting: Cardiology

## 2016-11-22 DIAGNOSIS — E669 Obesity, unspecified: Secondary | ICD-10-CM | POA: Insufficient documentation

## 2016-11-22 DIAGNOSIS — Z6841 Body Mass Index (BMI) 40.0 and over, adult: Secondary | ICD-10-CM | POA: Diagnosis not present

## 2016-11-22 DIAGNOSIS — R413 Other amnesia: Secondary | ICD-10-CM | POA: Insufficient documentation

## 2016-11-22 DIAGNOSIS — Z794 Long term (current) use of insulin: Secondary | ICD-10-CM | POA: Diagnosis not present

## 2016-11-22 DIAGNOSIS — I5022 Chronic systolic (congestive) heart failure: Secondary | ICD-10-CM | POA: Diagnosis not present

## 2016-11-22 DIAGNOSIS — Z79899 Other long term (current) drug therapy: Secondary | ICD-10-CM | POA: Diagnosis not present

## 2016-11-22 DIAGNOSIS — E119 Type 2 diabetes mellitus without complications: Secondary | ICD-10-CM | POA: Insufficient documentation

## 2016-11-22 DIAGNOSIS — I110 Hypertensive heart disease with heart failure: Secondary | ICD-10-CM | POA: Insufficient documentation

## 2016-11-22 DIAGNOSIS — Z7982 Long term (current) use of aspirin: Secondary | ICD-10-CM | POA: Diagnosis not present

## 2016-11-22 DIAGNOSIS — M1990 Unspecified osteoarthritis, unspecified site: Secondary | ICD-10-CM | POA: Diagnosis not present

## 2016-11-22 DIAGNOSIS — M311 Thrombotic microangiopathy: Secondary | ICD-10-CM | POA: Insufficient documentation

## 2016-11-22 DIAGNOSIS — I255 Ischemic cardiomyopathy: Secondary | ICD-10-CM | POA: Diagnosis not present

## 2016-11-22 DIAGNOSIS — Z87891 Personal history of nicotine dependence: Secondary | ICD-10-CM | POA: Diagnosis not present

## 2016-11-22 DIAGNOSIS — Z8711 Personal history of peptic ulcer disease: Secondary | ICD-10-CM | POA: Insufficient documentation

## 2016-11-22 DIAGNOSIS — E079 Disorder of thyroid, unspecified: Secondary | ICD-10-CM | POA: Insufficient documentation

## 2016-11-22 DIAGNOSIS — I2510 Atherosclerotic heart disease of native coronary artery without angina pectoris: Secondary | ICD-10-CM | POA: Diagnosis not present

## 2016-11-22 NOTE — Progress Notes (Signed)
Daily Session Note  Patient Details  Name: [name] MRN: [id number] Date of Birth: [birth year] Referring Provider:   Flowsheet Row CARDIAC REHAB PHASE II ORIENTATION from 10/14/2016 in [place]  Referring Provider  Dr. Harl Bowie      Encounter Date: 11/22/2016  Check In:     Session Check In - 11/22/16 1100      Check-In   Location [facility]   Staff Present Aundra Dubin, RN, BSN;Otto Caraway Luther Parody, BS, EP, Exercise Physiologist   Supervising physician immediately available to respond to emergencies See telemetry face sheet for immediately available MD   Medication changes reported     No   Fall or balance concerns reported    No   Warm-up and Cool-down Performed as group-led instruction   Resistance Training Performed Yes   VAD Patient? No     Pain Assessment   Currently in Pain? No/denies   Pain Score 0-No pain   Multiple Pain Sites No      Capillary Blood Glucose: No results found for this or any previous visit (from the past 24 hour(s)).    History  Smoking Status  . Former Smoker  . Packs/day: 0.25  . Years: 10.00  . Types: Cigarettes  . Start date: 09/20/1966  . Quit date: 09/20/1978  Smokeless Tobacco  . Never Used    Comment: Quit 2008    Goals Met:  Independence with exercise equipment Exercise tolerated well No report of cardiac concerns or symptoms Strength training completed today  Goals Unmet:  Not Applicable  Comments: Check out 1200   Dr. Kate Sable is Medical Director for [place] and [facility].

## 2016-11-23 NOTE — Progress Notes (Signed)
Cardiac Individual Treatment Plan  Patient Details  Name: [name] MRN: [id number] Date of Birth: Dec 17, 1950 Referring Provider:   Flowsheet Row CARDIAC REHAB PHASE II ORIENTATION from 10/14/2016 in [facility]  Referring Provider  Dr. Harl Bowie      Initial Encounter Date:  Flowsheet Row CARDIAC REHAB PHASE II ORIENTATION from 10/14/2016 in [facility]  Date  10/14/16  Referring Provider  Dr. Harl Bowie      Visit Diagnosis: Chronic systolic CHF (congestive heart failure) ([place])  Patient's Home Medications on Admission:  Current Outpatient Prescriptions:  .  acetaminophen (TYLENOL) 500 MG tablet, Take 1,000 mg by mouth every 6 (six) hours as needed for moderate pain. , Disp: , Rfl:  .  aspirin 81 MG chewable tablet, Chew 1 tablet (81 mg total) by mouth daily., Disp: , Rfl:  .  atorvastatin (LIPITOR) 80 MG tablet, Take 1 tablet (80 mg total) by mouth daily at 6 PM., Disp: 90 tablet, Rfl: 3 .  carvedilol (COREG) 25 MG tablet, Take 1 tablet (25 mg total) by mouth 2 (two) times daily., Disp: 180 tablet, Rfl: 3 .  furosemide (LASIX) 40 MG tablet, TAKE ONE TABLET BY MOUTH DAILY., Disp: 90 tablet, Rfl: 0 .  insulin glargine (LANTUS) 100 UNIT/ML injection, Inject 40 Units into the skin 2 (two) times daily., Disp: , Rfl:  .  losartan (COZAAR) 50 MG tablet, Take 50 mg by mouth daily., Disp: , Rfl:  .  Multiple Vitamin (MULTIVITAMIN) tablet, Take 1 tablet by mouth daily., Disp: , Rfl:  .  NOVOLOG 100 UNIT/ML injection, Inject 10 Units into the skin 3 (three) times daily with meals. , Disp: , Rfl:  .  polyethylene glycol (MIRALAX / GLYCOLAX) packet, Take 17 g by mouth daily., Disp: , Rfl:  .  ranitidine (ZANTAC) 150 MG tablet, Take 150 mg by mouth 2 (two) times daily., Disp: , Rfl:  .  spironolactone (ALDACTONE) 25 MG tablet, Take 1 tablet (25 mg total) by mouth daily., Disp: 90 tablet, Rfl: 3  Past Medical History: Past Medical History:  Diagnosis  Date  . Anemia   . Arthritis   . CAD (coronary artery disease)    a. LHC (03/2014) [place]: nl, LAD: diff dz proximal 20%, 30-40% dz mid vessel prior 2nd diagonal, LCx: 40% in OM1, 20-30% in OM2, RCA: 99% subtotal occlusion @ crux, TIMI 1 flow, L-R collaterals to distal vessel  . Chronic systolic heart failure ([place])    a. ECHO (03/2014): EF 25-30%, akinesis enteroanteroseptal myocardium, grade III DD b. RHC (03/2014) RA 4, RV 42/4, PA 44/14 (26), PCWP 21, PA 62% Fick CO/CI 6.9 / 3.4  . Diabetes mellitus   . Duodenal ulcer    Age 66  . Erosive esophagitis   . Gastritis   . Hypertension   . Ischemic cardiomyopathy   . Obese   . Short-term memory loss   . Thyroid disease   . TTP (thrombotic thrombocytopenic purpura) ([facility])     Tobacco Use: History  Smoking Status  . Former Smoker  . Packs/day: 0.25  . Years: 10.00  . Types: Cigarettes  . Start date: 09/20/1966  . Quit date: 09/20/1978  Smokeless Tobacco  . Never Used    Comment: Quit 2008    Labs: Recent Review Flowsheet Data    Labs for ITP Cardiac and Pulmonary Rehab Latest Ref Rng & Units 03/23/2014 03/24/2014 03/28/2014 03/28/2014 03/28/2014   Hemoglobin A1c <5.7 % - - - - -   PHART  7.350 - 7.450 7.393 - - 7.436 7.413   PCO2ART 35.0 - 45.0 mmHg 35.3 - - 43.8 40.7   HCO3 20.0 - 24.0 mEq/L 21.6 - 29.1(H) 29.4(H) 26.0(H)   TCO2 0 - 100 mmol/L 23 - 30 31 27    ACIDBASEDEF 0.0 - 2.0 mmol/L 3.0(H) - - - -   O2SAT % 94.0 68.5 62.0 84.0 90.0      Capillary Blood Glucose: Lab Results  Component Value Date   GLUCAP 101 (H) 06/14/2015   GLUCAP 83 05/28/2015   GLUCAP 156 (H) 04/15/2014   GLUCAP 107 (H) 04/15/2014   GLUCAP 73 04/14/2014     Exercise Target Goals:    Exercise Program Goal: Individual exercise prescription set with THRR, safety & activity barriers. Participant demonstrates ability to understand and report RPE using BORG scale, to self-measure pulse accurately, and to acknowledge the importance of the exercise  prescription.  Exercise Prescription Goal: Starting with aerobic activity 30 plus minutes a day, 3 days per week for initial exercise prescription. Provide home exercise prescription and guidelines that participant acknowledges understanding prior to discharge.  Activity Barriers & Risk Stratification:     Activity Barriers & Cardiac Risk Stratification - 10/14/16 1501      Activity Barriers & Cardiac Risk Stratification   Activity Barriers None   Cardiac Risk Stratification High      6 Minute Walk:     6 Minute Walk    Row Name 10/14/16 1458         6 Minute Walk   Phase Initial     Distance 700 feet  Patient had to stop and rest for 30 seconds     Distance % Change 0 %     Walk Time 6 minutes     # of Rest Breaks 0     MPH 1.3     METS 2.01     RPE 15     Perceived Dyspnea  13     VO2 Peak 4.08     Symptoms No     Resting HR 86 bpm     Resting BP 88/62     Max Ex. HR 98 bpm     Max Ex. BP 120/66     2 Minute Post BP 94/60        Oxygen Initial Assessment:   Oxygen Re-Evaluation:   Oxygen Discharge (Final Oxygen Re-Evaluation):   Initial Exercise Prescription:     Initial Exercise Prescription - 10/14/16 1400      Date of Initial Exercise RX and Referring Provider   Date 10/14/16   Referring Provider Dr. Harl Bowie     NuStep   Level 2   SPM 10   Minutes 20   METs 1.6     Arm Ergometer   Level 2   Watts 19   Minutes 15   METs 2.5     Prescription Details   Frequency (times per week) 3   Duration Progress to 30 minutes of continuous aerobic without signs/symptoms of physical distress     Intensity   THRR REST +  30   THRR 40-80% of Max Heartrate 113-126-140   Ratings of Perceived Exertion 11-13   Perceived Dyspnea 0-4     Progression   Progression Continue progressive overload as per policy without signs/symptoms or physical distress.     Resistance Training   Training Prescription Yes   Weight 1   Reps 10-12      Perform  Capillary Blood Glucose  checks as needed.  Exercise Prescription Changes:      Exercise Prescription Changes    Row Name 10/25/16 1400 11/22/16 1300           Response to Exercise   Blood Pressure (Admit) 100/50 104/64      Blood Pressure (Exercise) 110/66 124/66      Blood Pressure (Exit) 94/60 100/62      Heart Rate (Admit) 84 bpm 82 bpm      Heart Rate (Exercise) 94 bpm 11 bpm      Heart Rate (Exit) 90 bpm 90 bpm      Rating of Perceived Exertion (Exercise) 14 11      Duration Progress to 30 minutes of continuous aerobic without signs/symptoms of physical distress Progress to 30 minutes of  aerobic without signs/symptoms of physical distress      Intensity Rest + 30 THRR unchanged        Progression   Progression Continue progressive overload as per policy without signs/symptoms or physical distress. Continue to progress workloads to maintain intensity without signs/symptoms of physical distress.        Resistance Training   Training Prescription Yes Yes      Weight 1 1      Reps 10-12 10-15        NuStep   Level 2 1      SPM 14 33      Minutes 15 15      METs 3.71 3.71        Arm Ergometer   Level 1.5 2      Watts 2 4      Minutes 20 20      METs 1.5 2        Home Exercise Plan   Plans to continue exercise at [place] (comment)      Frequency Add 2 additional days to program exercise sessions. Add 2 additional days to program exercise sessions.        Exercise Review   Progression No Yes         Exercise Comments:      Exercise Comments    Row Name 10/25/16 1441 11/22/16 1318         Exercise Comments Patient has only recently began CR and will be progressed in time.  Patient is doing well in CR. She has gone down in level on Nustep due to weakness in knees but has progressed in upper body strength due to teh arm ergometer          Exercise Goals and Review:   Exercise Goals Re-Evaluation :     Exercise Goals Re-Evaluation    Row Name  11/23/16 1028             Exercise Goal Re-Evaluation   Exercise Goals Review Increase Strenth and Stamina;Increase Physical Activity       Comments Patient's strength and stamina are increasing. She is able to do more around her home and feels better overall.        Expected Outcomes Patient will continue to progress.            Discharge Exercise Prescription (Final Exercise Prescription Changes):     Exercise Prescription Changes - 11/22/16 1300      Response to Exercise   Blood Pressure (Admit) 104/64   Blood Pressure (Exercise) 124/66   Blood Pressure (Exit) 100/62   Heart Rate (Admit) 82 bpm   Heart Rate (Exercise) 11 bpm   Heart  Rate (Exit) 90 bpm   Rating of Perceived Exertion (Exercise) 11   Duration Progress to 30 minutes of  aerobic without signs/symptoms of physical distress   Intensity THRR unchanged     Progression   Progression Continue to progress workloads to maintain intensity without signs/symptoms of physical distress.     Resistance Training   Training Prescription Yes   Weight 1   Reps 10-15     NuStep   Level 1   SPM 33   Minutes 15   METs 3.71     Arm Ergometer   Level 2   Watts 4   Minutes 20   METs 2     Home Exercise Plan   Plans to continue exercise at Home (comment)   Frequency Add 2 additional days to program exercise sessions.     Exercise Review   Progression Yes      Nutrition:  Target Goals: Understanding of nutrition guidelines, daily intake of sodium <1570m, cholesterol <2062m calories 30% from fat and 7% or less from saturated fats, daily to have 5 or more servings of fruits and vegetables.  Biometrics:     Pre Biometrics - 10/14/16 1501      Pre Biometrics   Height 5' 5"  (1.651 m)   Weight 244 lb 4.3 oz (110.8 kg)   Waist Circumference 47.5 inches   Hip Circumference 50.5 inches   Waist to Hip Ratio 0.94 %   BMI (Calculated) 40.7   Triceps Skinfold 26 mm   % Body Fat 49.7 %   Grip Strength 51.33 kg    Flexibility 0 in   Single Leg Stand 7 seconds       Nutrition Therapy Plan and Nutrition Goals:   Nutrition Discharge: Rate Your Plate Scores:     Nutrition Assessments - 10/14/16 1522      MEDFICTS Scores   Pre Score 50      Nutrition Goals Re-Evaluation:   Nutrition Goals Discharge (Final Nutrition Goals Re-Evaluation):   Psychosocial: Target Goals: Acknowledge presence or absence of significant depression and/or stress, maximize coping skills, provide positive support system. Participant is able to verbalize types and ability to use techniques and skills needed for reducing stress and depression.  Initial Review & Psychosocial Screening:     Initial Psych Review & Screening - 10/14/16 1530      Initial Review   Current issues with Current Sleep Concerns     Family Dynamics   Good Support System? Yes     Barriers   Psychosocial barriers to participate in program There are no identifiable barriers or psychosocial needs.     Screening Interventions   Interventions Encouraged to exercise      Quality of Life Scores:     Quality of Life - 10/14/16 1502      Quality of Life Scores   Health/Function Pre 17.1 %   Socioeconomic Pre 24.13 %   Psych/Spiritual Pre 24 %   Family Pre 23.7 %   GLOBAL Pre 21.03 %      PHQ-9: Recent Review Flowsheet Data    [facility] 10/14/2016 07/19/2016 01/30/2015 01/01/2015   Decreased Interest 0 0 0 0   Down, Depressed, Hopeless 0 0 0 0    PHQ - 2 Score 0 0 0 0   Altered sleeping 1 - - -   Tired, decreased energy 2 - - -   Change in appetite 2 - - -   Feeling bad or failure about  yourself  0 - - -   Trouble concentrating 2 - - -   Moving slowly or fidgety/restless 0 - - -   Suicidal thoughts 0 - - -   PHQ-9 Score 7 - - -   Difficult doing work/chores Somewhat difficult - - -     Interpretation of Total Score  Total Score Depression Severity:  1-4 = Minimal depression, 5-9 = Mild depression, 10-14 =  Moderate depression, 15-19 = Moderately severe depression, 20-27 = Severe depression   Psychosocial Evaluation and Intervention:     Psychosocial Evaluation - 10/14/16 1530      Psychosocial Evaluation & Interventions   Interventions Encouraged to exercise with the program and follow exercise prescription   Continue Psychosocial Services  No      Psychosocial Re-Evaluation:     Psychosocial Re-Evaluation    [place] Name 10/28/16 0932 11/23/16 1029           Psychosocial Re-Evaluation   Current issues with  - None Identified      Comments Patient's QOL score was 21.03 and her PHQ-9 score was 7. She does not feel she is depressed and does not feel she need counseling.  Patient's QOL was 21.03 scoring lower in health function and her PHQ-9 was 7. She says she is not depressed.       Interventions Encouraged to attend Cardiac Rehabilitation for the exercise Encouraged to attend Cardiac Rehabilitation for the exercise      Continue Psychosocial Services  No No Follow up required         Psychosocial Discharge (Final Psychosocial Re-Evaluation):     Psychosocial Re-Evaluation - 11/23/16 1029      Psychosocial Re-Evaluation   Current issues with None Identified   Comments Patient's QOL was 21.03 scoring lower in health function and her PHQ-9 was 7. She says she is not depressed.    Interventions Encouraged to attend Cardiac Rehabilitation for the exercise   Continue Psychosocial Services  No Follow up required      Vocational Rehabilitation: Provide vocational rehab assistance to qualifying candidates.   Vocational Rehab Evaluation & Intervention:     Vocational Rehab - 10/14/16 1502      Initial Vocational Rehab Evaluation & Intervention   Assessment shows need for Vocational Rehabilitation No      Education: Education Goals: Education classes will be provided on a weekly basis, covering required topics. Participant will state understanding/return demonstration of  topics presented.  Learning Barriers/Preferences:     Learning Barriers/Preferences - 10/14/16 1501      Learning Barriers/Preferences   Learning Preferences Pictoral;Video;Written Material      Education Topics: Hypertension, Hypertension Reduction -Define heart disease and high blood pressure. Discus how high blood pressure affects the body and ways to reduce high blood pressure. Flowsheet Row CARDIAC REHAB PHASE II EXERCISE from 11/17/2016 in [facility]  Date  10/20/16  Educator  DC  Instruction Review Code  2- meets goals/outcomes      Exercise and Your Heart -Discuss why it is important to exercise, the FITT principles of exercise, normal and abnormal responses to exercise, and how to exercise safely. Flowsheet Row CARDIAC REHAB PHASE II EXERCISE from 11/17/2016 in [facility]  Date  10/27/16  Educator  DC  Instruction Review Code  2- meets goals/outcomes      Angina -Discuss definition of angina, causes of angina, treatment of angina, and how to decrease risk of having angina. [facility] [place] REHAB PHASE  II EXERCISE from 11/17/2016 in [facility]  Date  11/03/16  Educator  DJ  Instruction Review Code  2- meets goals/outcomes      Cardiac Medications -Review what the following cardiac medications are used for, how they affect the body, and side effects that may occur when taking the medications.  Medications include Aspirin, Beta blockers, calcium channel blockers, ACE Inhibitors, angiotensin receptor blockers, diuretics, digoxin, and antihyperlipidemics.   Congestive Heart Failure -Discuss the definition of CHF, how to live with CHF, the signs and symptoms of CHF, and how keep track of weight and sodium intake. Flowsheet Row CARDIAC REHAB PHASE II EXERCISE from 11/17/2016 in [facility]  Date  11/17/16  Educator  DC  Instruction Review Code  2- meets goals/outcomes       Heart Disease and Intimacy -Discus the effect sexual activity has on the heart, how changes occur during intimacy as we age, and safety during sexual activity.   Smoking Cessation / COPD -Discuss different methods to quit smoking, the health benefits of quitting smoking, and the definition of COPD.   Nutrition I: Fats -Discuss the types of cholesterol, what cholesterol does to the heart, and how cholesterol levels can be controlled.   Nutrition II: Labels -Discuss the different components of food labels and how to read food label   Heart Parts and Heart Disease -Discuss the anatomy of the heart, the pathway of blood circulation through the heart, and these are affected by heart disease.   Stress I: Signs and Symptoms -Discuss the causes of stress, how stress may lead to anxiety and depression, and ways to limit stress.   Stress II: Relaxation -Discuss different types of relaxation techniques to limit stress.   Warning Signs of Stroke / TIA -Discuss definition of a stroke, what the signs and symptoms are of a stroke, and how to identify when someone is having stroke.   Knowledge Questionnaire Score:     Knowledge Questionnaire Score - 10/14/16 1502      Knowledge Questionnaire Score   Pre Score 16/24      Core Components/Risk Factors/Patient Goals at Admission:     Personal Goals and Risk Factors at Admission - 10/14/16 1522      Core Components/Risk Factors/Patient Goals on Admission    Weight Management Yes   Admit Weight 244 lb 8 oz (110.9 kg)   Goal Weight: Short Term 234 lb 8 oz (106.4 kg)   Goal Weight: Long Term 224 lb 8 oz (101.8 kg)   Expected Outcomes Short Term: Continue to assess and modify interventions until short term weight is achieved;Long Term: Adherence to nutrition and physical activity/exercise program aimed toward attainment of established weight goal   Sedentary Yes   Intervention Provide advice, education, support and counseling about  physical activity/exercise needs.;Develop an individualized exercise prescription for aerobic and resistive training based on initial evaluation findings, risk stratification, comorbidities and participant's personal goals.   Expected Outcomes Achievement of increased cardiorespiratory fitness and enhanced flexibility, muscular endurance and strength shown through measurements of functional capacity and personal statement of participant.   Increase Strength and Stamina Yes   Intervention Provide advice, education, support and counseling about physical activity/exercise needs.;Develop an individualized exercise prescription for aerobic and resistive training based on initial evaluation findings, risk stratification, comorbidities and participant's personal goals.   Expected Outcomes Achievement of increased cardiorespiratory fitness and enhanced flexibility, muscular endurance and strength shown through measurements of functional capacity and personal statement of participant.  Diabetes Yes   Intervention Provide education about signs/symptoms and action to take for hypo/hyperglycemia.;Provide education about proper nutrition, including hydration, and aerobic/resistive exercise prescription along with prescribed medications to achieve blood glucose in normal ranges: Fasting glucose 65-99 mg/dL   Expected Outcomes Short Term: Participant verbalizes understanding of the signs/symptoms and immediate care of hyper/hypoglycemia, proper foot care and importance of medication, aerobic/resistive exercise and nutrition plan for blood glucose control.;Long Term: Attainment of HbA1C < 7%.   Heart Failure Yes   Intervention Provide a combined exercise and nutrition program that is supplemented with education, support and counseling about heart failure. Directed toward relieving symptoms such as shortness of breath, decreased exercise tolerance, and extremity edema.   Expected Outcomes Long term: Adoption of self-care  skills and reduction of barriers for early signs and symptoms recognition and intervention leading to self-care maintenance.;Short term: Attendance in program 2-3 days a week with increased exercise capacity. Reported lower sodium intake. Reported increased fruit and vegetable intake. Reports medication compliance.   Personal Goal Other Yes   Personal Goal Get around more, Get stronger, lose weight   Intervention Attend CR 3xweek and supplement with 2xweek at home   Expected Outcomes Reach personal goals.       Core Components/Risk Factors/Patient Goals Review:      Goals and Risk Factor Review    Row Name 10/14/16 1529 10/28/16 0928 11/23/16 1023         Core Components/Risk Factors/Patient Goals Review   Personal Goals Review Weight Management/Obesity;Sedentary;Increase Strength and Stamina;Heart Failure;Diabetes Weight Management/Obesity;Sedentary;Increase Strength and Stamina;Diabetes  Get around more; Get stronger; lose 50 lbs.  Weight Management/Obesity;Diabetes;Heart Failure  Get around more; get stronger; lose 50 lbs.      Review  - Patient has attended 5 sessions. Will continue to monitor for progress.  Patient has completed 13 sessions. She has maintained her weight. She has progressed well in the program. She says she is feeling stronger and feels like doing more at home.      Expected Outcomes  - Patient will continue to attend sessions meeting her personal goals.  Patient will complete the program meeting her personal goals.         Core Components/Risk Factors/Patient Goals at Discharge (Final Review):      Goals and Risk Factor Review - 11/23/16 1023      Core Components/Risk Factors/Patient Goals Review   Personal Goals Review Weight Management/Obesity;Diabetes;Heart Failure  Get around more; get stronger; lose 50 lbs.    Review Patient has completed 13 sessions. She has maintained her weight. She has progressed well in the program. She says she is feeling stronger  and feels like doing more at home.    Expected Outcomes Patient will complete the program meeting her personal goals.       ITP Comments:     ITP Comments    Row Name 11/22/16 1503           ITP Comments Patient met with Registered Dietitian to discuss nutrition topics including: Heart healthty eating, heart health cooking and make smart choices when shopping; Portion control; weight management; and hydration. Patient attended a group session with the hospital chaplian called [facility] to discuss and share how her recent cardiac diagnosis has effected her life.          Comments: ITP 30 Day REVIEW Patient is doing well in the program. Will continue to monitor.

## 2016-11-24 ENCOUNTER — Encounter (HOSPITAL_COMMUNITY)
Admission: RE | Admit: 2016-11-24 | Discharge: 2016-11-24 | Disposition: A | Discharge status: Home / Self Care | Payer: Medicare HMO | Source: Ambulatory Visit | Attending: Cardiology | Admitting: Cardiology

## 2016-11-24 DIAGNOSIS — M1990 Unspecified osteoarthritis, unspecified site: Secondary | ICD-10-CM | POA: Diagnosis not present

## 2016-11-24 DIAGNOSIS — I5022 Chronic systolic (congestive) heart failure: Secondary | ICD-10-CM | POA: Diagnosis not present

## 2016-11-24 DIAGNOSIS — E119 Type 2 diabetes mellitus without complications: Secondary | ICD-10-CM | POA: Diagnosis not present

## 2016-11-24 DIAGNOSIS — I255 Ischemic cardiomyopathy: Secondary | ICD-10-CM | POA: Diagnosis not present

## 2016-11-24 DIAGNOSIS — Z794 Long term (current) use of insulin: Secondary | ICD-10-CM | POA: Diagnosis not present

## 2016-11-24 DIAGNOSIS — Z79899 Other long term (current) drug therapy: Secondary | ICD-10-CM | POA: Diagnosis not present

## 2016-11-24 DIAGNOSIS — I2510 Atherosclerotic heart disease of native coronary artery without angina pectoris: Secondary | ICD-10-CM | POA: Diagnosis not present

## 2016-11-24 DIAGNOSIS — Z8711 Personal history of peptic ulcer disease: Secondary | ICD-10-CM | POA: Diagnosis not present

## 2016-11-24 DIAGNOSIS — I110 Hypertensive heart disease with heart failure: Secondary | ICD-10-CM | POA: Diagnosis not present

## 2016-11-24 NOTE — Progress Notes (Signed)
Daily Session Note  Patient Details  Name: [name] MRN: [id number] Date of Birth: 08/31/1951 Referring Provider:   Flowsheet Row CARDIAC REHAB PHASE II ORIENTATION from 10/14/2016 in [facility]  Referring Provider  Dr. Harl Bowie      Encounter Date: 11/24/2016  Check In:     Session Check In - 11/24/16 1050      Check-In   Location [facility]   Staff Present Aundra Dubin, RN, BSN;Zoii Florer Luther Parody, BS, EP, Exercise Physiologist   Supervising physician immediately available to respond to emergencies See telemetry face sheet for immediately available MD   Medication changes reported     No   Fall or balance concerns reported    No   Warm-up and Cool-down Performed as group-led instruction   Resistance Training Performed Yes   VAD Patient? No     Pain Assessment   Currently in Pain? No/denies   Pain Score 0-No pain   Multiple Pain Sites No      Capillary Blood Glucose: No results found for this or any previous visit (from the past 24 hour(s)).    History  Smoking Status  . Former Smoker  . Packs/day: 0.25  . Years: 10.00  . Types: Cigarettes  . Start date: 09/20/1966  . Quit date: 09/20/1978  Smokeless Tobacco  . Never Used    Comment: Quit 2008    Goals Met:  Independence with exercise equipment Exercise tolerated well No report of cardiac concerns or symptoms Strength training completed today  Goals Unmet:  Not Applicable  Comments: Check out 1200   Dr. Kate Sable is Medical Director for [facility] and [facility].

## 2016-11-26 ENCOUNTER — Encounter (HOSPITAL_COMMUNITY)
Admission: RE | Admit: 2016-11-26 | Discharge: 2016-11-26 | Disposition: A | Discharge status: Home / Self Care | Payer: Medicare HMO | Source: Ambulatory Visit | Attending: Cardiology | Admitting: Cardiology

## 2016-11-26 DIAGNOSIS — I5022 Chronic systolic (congestive) heart failure: Secondary | ICD-10-CM | POA: Diagnosis not present

## 2016-11-26 DIAGNOSIS — Z79899 Other long term (current) drug therapy: Secondary | ICD-10-CM | POA: Diagnosis not present

## 2016-11-26 DIAGNOSIS — Z794 Long term (current) use of insulin: Secondary | ICD-10-CM | POA: Diagnosis not present

## 2016-11-26 DIAGNOSIS — Z8711 Personal history of peptic ulcer disease: Secondary | ICD-10-CM | POA: Diagnosis not present

## 2016-11-26 DIAGNOSIS — I255 Ischemic cardiomyopathy: Secondary | ICD-10-CM | POA: Diagnosis not present

## 2016-11-26 DIAGNOSIS — I2510 Atherosclerotic heart disease of native coronary artery without angina pectoris: Secondary | ICD-10-CM | POA: Diagnosis not present

## 2016-11-26 DIAGNOSIS — I110 Hypertensive heart disease with heart failure: Secondary | ICD-10-CM | POA: Diagnosis not present

## 2016-11-26 DIAGNOSIS — E119 Type 2 diabetes mellitus without complications: Secondary | ICD-10-CM | POA: Diagnosis not present

## 2016-11-26 DIAGNOSIS — M1990 Unspecified osteoarthritis, unspecified site: Secondary | ICD-10-CM | POA: Diagnosis not present

## 2016-11-26 NOTE — Progress Notes (Signed)
Daily Session Note  Patient Details  Name: [name] MRN: [id number] Date of Birth: [birth year] Referring Provider:   Flowsheet Row CARDIAC REHAB PHASE II ORIENTATION from 10/14/2016 in [place]  Referring Provider  Dr. Harl Bowie      Encounter Date: 11/26/2016  Check In:     Session Check In - 11/26/16 1100      Check-In   Location [facility]   Staff Present Diane Angelina Pih, MS, EP, [facility], Exercise Physiologist;Oddis Westling Luther Parody, BS, EP, Exercise Physiologist   Supervising physician immediately available to respond to emergencies See telemetry face sheet for immediately available MD   Medication changes reported     No   Fall or balance concerns reported    No   Warm-up and Cool-down Performed as group-led instruction   Resistance Training Performed Yes   VAD Patient? No     Pain Assessment   Currently in Pain? No/denies   Pain Score 0-No pain   Multiple Pain Sites No      Capillary Blood Glucose: No results found for this or any previous visit (from the past 24 hour(s)).    History  Smoking Status  . Former Smoker  . Packs/day: 0.25  . Years: 10.00  . Types: Cigarettes  . Start date: 09/20/1966  . Quit date: 09/20/1978  Smokeless Tobacco  . Never Used    Comment: Quit 2008    Goals Met:  Independence with exercise equipment Exercise tolerated well No report of cardiac concerns or symptoms Strength training completed today  Goals Unmet:  Not Applicable  Comments: Check out 1200   Dr. Kate Sable is Medical Director for [place] and Pulmonary Rehab.

## 2016-11-29 ENCOUNTER — Encounter (HOSPITAL_COMMUNITY): Payer: Medicare HMO

## 2016-12-01 ENCOUNTER — Encounter (HOSPITAL_COMMUNITY): Admission: RE | Admit: 2016-12-01 | Payer: Medicare HMO | Source: Ambulatory Visit

## 2016-12-03 ENCOUNTER — Encounter (HOSPITAL_COMMUNITY): Payer: Medicare HMO

## 2016-12-06 ENCOUNTER — Encounter (HOSPITAL_COMMUNITY)
Admission: RE | Admit: 2016-12-06 | Discharge: 2016-12-06 | Disposition: A | Discharge status: Home / Self Care | Payer: Medicare HMO | Source: Ambulatory Visit | Attending: Cardiology | Admitting: Cardiology

## 2016-12-06 DIAGNOSIS — Z794 Long term (current) use of insulin: Secondary | ICD-10-CM | POA: Diagnosis not present

## 2016-12-06 DIAGNOSIS — I110 Hypertensive heart disease with heart failure: Secondary | ICD-10-CM | POA: Diagnosis not present

## 2016-12-06 DIAGNOSIS — Z79899 Other long term (current) drug therapy: Secondary | ICD-10-CM | POA: Diagnosis not present

## 2016-12-06 DIAGNOSIS — E119 Type 2 diabetes mellitus without complications: Secondary | ICD-10-CM | POA: Diagnosis not present

## 2016-12-06 DIAGNOSIS — I5022 Chronic systolic (congestive) heart failure: Secondary | ICD-10-CM | POA: Diagnosis not present

## 2016-12-06 DIAGNOSIS — I255 Ischemic cardiomyopathy: Secondary | ICD-10-CM | POA: Diagnosis not present

## 2016-12-06 DIAGNOSIS — M1990 Unspecified osteoarthritis, unspecified site: Secondary | ICD-10-CM | POA: Diagnosis not present

## 2016-12-06 DIAGNOSIS — I2510 Atherosclerotic heart disease of native coronary artery without angina pectoris: Secondary | ICD-10-CM | POA: Diagnosis not present

## 2016-12-06 DIAGNOSIS — Z8711 Personal history of peptic ulcer disease: Secondary | ICD-10-CM | POA: Diagnosis not present

## 2016-12-06 NOTE — Progress Notes (Signed)
Daily Session Note  Patient Details  Name: [name] MRN: [id number] Date of Birth: [birth year] Referring Provider:     CARDIAC REHAB PHASE II ORIENTATION from 10/14/2016 in [place]  Referring Provider  Dr. Harl Bowie      Encounter Date: 12/06/2016  Check In:     Session Check In - 12/06/16 1100      Check-In   Location [facility]   Staff Present Diane Angelina Pih, MS, EP, [facility], Exercise Physiologist;Marabeth Melland Luther Parody, BS, EP, Exercise Physiologist   Supervising physician immediately available to respond to emergencies See telemetry face sheet for immediately available MD   Medication changes reported     No   Fall or balance concerns reported    No   Warm-up and Cool-down Performed as group-led instruction   Resistance Training Performed Yes   VAD Patient? No     Pain Assessment   Currently in Pain? No/denies   Pain Score 0-No pain   Multiple Pain Sites No      Capillary Blood Glucose: No results found for this or any previous visit (from the past 24 hour(s)).    History  Smoking Status  . Former Smoker  . Packs/day: 0.25  . Years: 10.00  . Types: Cigarettes  . Start date: 09/20/1966  . Quit date: 09/20/1978  Smokeless Tobacco  . Never Used    Comment: Quit 2008    Goals Met:  Independence with exercise equipment Exercise tolerated well No report of cardiac concerns or symptoms Strength training completed today  Goals Unmet:  Not Applicable  Comments: Check out 1200   Dr. Kate Sable is Medical Director for [place] and Pulmonary Rehab.

## 2016-12-08 ENCOUNTER — Encounter (HOSPITAL_COMMUNITY)
Admission: RE | Admit: 2016-12-08 | Discharge: 2016-12-08 | Disposition: A | Discharge status: Home / Self Care | Payer: Medicare HMO | Source: Ambulatory Visit | Attending: Cardiology | Admitting: Cardiology

## 2016-12-10 ENCOUNTER — Other Ambulatory Visit: Payer: Self-pay

## 2016-12-10 ENCOUNTER — Encounter (HOSPITAL_COMMUNITY): Admission: RE | Admit: 2016-12-10 | Payer: Medicare HMO | Source: Ambulatory Visit

## 2016-12-10 MED ORDER — FUROSEMIDE 40 MG PO TABS: 40.0000 mg | ORAL_TABLET | Freq: Every day | ORAL | 0 refills | Status: DC

## 2016-12-13 ENCOUNTER — Encounter (HOSPITAL_COMMUNITY)
Admission: RE | Admit: 2016-12-13 | Discharge: 2016-12-13 | Disposition: A | Discharge status: Home / Self Care | Payer: Medicare HMO | Source: Ambulatory Visit | Attending: Cardiology | Admitting: Cardiology

## 2016-12-13 DIAGNOSIS — Z8711 Personal history of peptic ulcer disease: Secondary | ICD-10-CM | POA: Diagnosis not present

## 2016-12-13 DIAGNOSIS — M1990 Unspecified osteoarthritis, unspecified site: Secondary | ICD-10-CM | POA: Diagnosis not present

## 2016-12-13 DIAGNOSIS — Z79899 Other long term (current) drug therapy: Secondary | ICD-10-CM | POA: Diagnosis not present

## 2016-12-13 DIAGNOSIS — E119 Type 2 diabetes mellitus without complications: Secondary | ICD-10-CM | POA: Diagnosis not present

## 2016-12-13 DIAGNOSIS — I2510 Atherosclerotic heart disease of native coronary artery without angina pectoris: Secondary | ICD-10-CM | POA: Diagnosis not present

## 2016-12-13 DIAGNOSIS — I5022 Chronic systolic (congestive) heart failure: Secondary | ICD-10-CM

## 2016-12-13 DIAGNOSIS — I110 Hypertensive heart disease with heart failure: Secondary | ICD-10-CM | POA: Diagnosis not present

## 2016-12-13 DIAGNOSIS — I255 Ischemic cardiomyopathy: Secondary | ICD-10-CM | POA: Diagnosis not present

## 2016-12-13 DIAGNOSIS — Z794 Long term (current) use of insulin: Secondary | ICD-10-CM | POA: Diagnosis not present

## 2016-12-13 NOTE — Progress Notes (Signed)
Daily Session Note  Patient Details  Name: [name] MRN: [id number] Date of Birth: [birth year] Referring Provider:     CARDIAC REHAB PHASE II ORIENTATION from 10/14/2016 in [place]  Referring Provider  Dr. Harl Bowie      Encounter Date: 12/13/2016  Check In:     Session Check In - 12/13/16 1100      Check-In   Location [facility]   Staff Present Suzanne Boron, BS, EP, Exercise Physiologist;Debra Wynetta Emery, RN, BSN   Supervising physician immediately available to respond to emergencies See telemetry face sheet for immediately available MD   Medication changes reported     No   Fall or balance concerns reported    No   Warm-up and Cool-down Performed as group-led instruction   Resistance Training Performed Yes   VAD Patient? No     Pain Assessment   Currently in Pain? No/denies   Pain Score 0-No pain   Multiple Pain Sites No      Capillary Blood Glucose: No results found for this or any previous visit (from the past 24 hour(s)).    History  Smoking Status  . Former Smoker  . Packs/day: 0.25  . Years: 10.00  . Types: Cigarettes  . Start date: 09/20/1966  . Quit date: 09/20/1978  Smokeless Tobacco  . Never Used    Comment: Quit 2008    Goals Met:  Independence with exercise equipment Exercise tolerated well No report of cardiac concerns or symptoms Strength training completed today  Goals Unmet:  Not Applicable  Comments: Check out 1200   Dr. Kate Sable is Medical Director for [place] and Pulmonary Rehab.

## 2016-12-15 ENCOUNTER — Encounter (HOSPITAL_COMMUNITY)
Admission: RE | Admit: 2016-12-15 | Discharge: 2016-12-15 | Disposition: A | Discharge status: Home / Self Care | Payer: Medicare HMO | Source: Ambulatory Visit | Attending: Cardiology | Admitting: Cardiology

## 2016-12-15 DIAGNOSIS — I2510 Atherosclerotic heart disease of native coronary artery without angina pectoris: Secondary | ICD-10-CM | POA: Diagnosis not present

## 2016-12-15 DIAGNOSIS — I5022 Chronic systolic (congestive) heart failure: Secondary | ICD-10-CM

## 2016-12-15 DIAGNOSIS — Z794 Long term (current) use of insulin: Secondary | ICD-10-CM | POA: Diagnosis not present

## 2016-12-15 DIAGNOSIS — I255 Ischemic cardiomyopathy: Secondary | ICD-10-CM | POA: Diagnosis not present

## 2016-12-15 DIAGNOSIS — I110 Hypertensive heart disease with heart failure: Secondary | ICD-10-CM | POA: Diagnosis not present

## 2016-12-15 DIAGNOSIS — Z79899 Other long term (current) drug therapy: Secondary | ICD-10-CM | POA: Diagnosis not present

## 2016-12-15 DIAGNOSIS — Z8711 Personal history of peptic ulcer disease: Secondary | ICD-10-CM | POA: Diagnosis not present

## 2016-12-15 DIAGNOSIS — E119 Type 2 diabetes mellitus without complications: Secondary | ICD-10-CM | POA: Diagnosis not present

## 2016-12-15 DIAGNOSIS — M1990 Unspecified osteoarthritis, unspecified site: Secondary | ICD-10-CM | POA: Diagnosis not present

## 2016-12-15 NOTE — Progress Notes (Signed)
Daily Session Note  Patient Details  Name: [name] MRN: [id number] Date of Birth: September 28, 1950 Referring Provider:     CARDIAC REHAB PHASE II ORIENTATION from 10/14/2016 in [place]  Referring Provider  Dr. Harl Bowie      Encounter Date: 12/15/2016  Check In:     Session Check In - 12/15/16 1100      Check-In   Location [facility]   Staff Present Aundra Dubin, RN, BSN;Jacier Gladu Luther Parody, BS, EP, Exercise Physiologist   Supervising physician immediately available to respond to emergencies See telemetry face sheet for immediately available MD   Medication changes reported     No   Fall or balance concerns reported    No   Warm-up and Cool-down Performed as group-led instruction   Resistance Training Performed Yes   VAD Patient? No     Pain Assessment   Currently in Pain? No/denies   Pain Score 0-No pain   Multiple Pain Sites No      Capillary Blood Glucose: No results found for this or any previous visit (from the past 24 hour(s)).    History  Smoking Status  . Former Smoker  . Packs/day: 0.25  . Years: 10.00  . Types: Cigarettes  . Start date: 09/20/1966  . Quit date: 09/20/1978  Smokeless Tobacco  . Never Used    Comment: Quit 2008    Goals Met:  Independence with exercise equipment Exercise tolerated well No report of cardiac concerns or symptoms Strength training completed today  Goals Unmet:  Not Applicable  Comments: Check out 1200   Dr. Kate Sable is Medical Director for [place] and Pulmonary Rehab.

## 2016-12-17 ENCOUNTER — Encounter (HOSPITAL_COMMUNITY)
Admission: RE | Admit: 2016-12-17 | Discharge: 2016-12-17 | Disposition: A | Discharge status: Home / Self Care | Payer: Medicare HMO | Source: Ambulatory Visit | Attending: Cardiology | Admitting: Cardiology

## 2016-12-17 DIAGNOSIS — I5022 Chronic systolic (congestive) heart failure: Secondary | ICD-10-CM | POA: Diagnosis not present

## 2016-12-17 DIAGNOSIS — M1990 Unspecified osteoarthritis, unspecified site: Secondary | ICD-10-CM | POA: Diagnosis not present

## 2016-12-17 DIAGNOSIS — Z8711 Personal history of peptic ulcer disease: Secondary | ICD-10-CM | POA: Diagnosis not present

## 2016-12-17 DIAGNOSIS — I110 Hypertensive heart disease with heart failure: Secondary | ICD-10-CM | POA: Diagnosis not present

## 2016-12-17 DIAGNOSIS — Z79899 Other long term (current) drug therapy: Secondary | ICD-10-CM | POA: Diagnosis not present

## 2016-12-17 DIAGNOSIS — Z794 Long term (current) use of insulin: Secondary | ICD-10-CM | POA: Diagnosis not present

## 2016-12-17 DIAGNOSIS — I255 Ischemic cardiomyopathy: Secondary | ICD-10-CM | POA: Diagnosis not present

## 2016-12-17 DIAGNOSIS — E119 Type 2 diabetes mellitus without complications: Secondary | ICD-10-CM | POA: Diagnosis not present

## 2016-12-17 DIAGNOSIS — I2510 Atherosclerotic heart disease of native coronary artery without angina pectoris: Secondary | ICD-10-CM | POA: Diagnosis not present

## 2016-12-17 NOTE — Progress Notes (Signed)
Daily Session Note  Patient Details  Name: NICKEY CANEDO MRN: [id number] Date of Birth: 11-22-1950 Referring Provider:     CARDIAC REHAB PHASE II ORIENTATION from 10/14/2016 in [place]  Referring Provider  Dr. Harl Bowie      Encounter Date: 12/17/2016  Check In:     Session Check In - 12/17/16 1100      Check-In   Location [facility]   Staff Present Aundra Dubin, RN, BSN;Shamirah Ivan Luther Parody, BS, EP, Exercise Physiologist   Supervising physician immediately available to respond to emergencies See telemetry face sheet for immediately available MD   Medication changes reported     No   Fall or balance concerns reported    No   Warm-up and Cool-down Performed as group-led instruction   Resistance Training Performed Yes   VAD Patient? No     Pain Assessment   Currently in Pain? No/denies   Pain Score 0-No pain   Multiple Pain Sites No      Capillary Blood Glucose: No results found for this or any previous visit (from the past 24 hour(s)).    History  Smoking Status  . Former Smoker  . Packs/day: 0.25  . Years: 10.00  . Types: Cigarettes  . Start date: 09/20/1966  . Quit date: 09/20/1978  Smokeless Tobacco  . Never Used    Comment: Quit 2008    Goals Met:  Independence with exercise equipment Exercise tolerated well No report of cardiac concerns or symptoms Strength training completed today  Goals Unmet:  Not Applicable  Comments: Check out 1200   Dr. Kate Sable is Medical Director for [place] and Pulmonary Rehab.

## 2016-12-20 ENCOUNTER — Encounter (HOSPITAL_COMMUNITY)
Admission: RE | Admit: 2016-12-20 | Discharge: 2016-12-20 | Disposition: A | Discharge status: Home / Self Care | Payer: Medicare HMO | Source: Ambulatory Visit | Attending: Cardiology | Admitting: Cardiology

## 2016-12-20 DIAGNOSIS — R413 Other amnesia: Secondary | ICD-10-CM | POA: Diagnosis not present

## 2016-12-20 DIAGNOSIS — Z7982 Long term (current) use of aspirin: Secondary | ICD-10-CM | POA: Insufficient documentation

## 2016-12-20 DIAGNOSIS — I110 Hypertensive heart disease with heart failure: Secondary | ICD-10-CM | POA: Insufficient documentation

## 2016-12-20 DIAGNOSIS — M311 Thrombotic microangiopathy: Secondary | ICD-10-CM | POA: Insufficient documentation

## 2016-12-20 DIAGNOSIS — I5022 Chronic systolic (congestive) heart failure: Secondary | ICD-10-CM | POA: Diagnosis not present

## 2016-12-20 DIAGNOSIS — M1990 Unspecified osteoarthritis, unspecified site: Secondary | ICD-10-CM | POA: Diagnosis not present

## 2016-12-20 DIAGNOSIS — E119 Type 2 diabetes mellitus without complications: Secondary | ICD-10-CM | POA: Diagnosis not present

## 2016-12-20 DIAGNOSIS — Z79899 Other long term (current) drug therapy: Secondary | ICD-10-CM | POA: Insufficient documentation

## 2016-12-20 DIAGNOSIS — E079 Disorder of thyroid, unspecified: Secondary | ICD-10-CM | POA: Insufficient documentation

## 2016-12-20 DIAGNOSIS — E669 Obesity, unspecified: Secondary | ICD-10-CM | POA: Insufficient documentation

## 2016-12-20 DIAGNOSIS — Z6841 Body Mass Index (BMI) 40.0 and over, adult: Secondary | ICD-10-CM | POA: Insufficient documentation

## 2016-12-20 DIAGNOSIS — Z8711 Personal history of peptic ulcer disease: Secondary | ICD-10-CM | POA: Diagnosis not present

## 2016-12-20 DIAGNOSIS — I2510 Atherosclerotic heart disease of native coronary artery without angina pectoris: Secondary | ICD-10-CM | POA: Diagnosis not present

## 2016-12-20 DIAGNOSIS — Z794 Long term (current) use of insulin: Secondary | ICD-10-CM | POA: Diagnosis not present

## 2016-12-20 DIAGNOSIS — I255 Ischemic cardiomyopathy: Secondary | ICD-10-CM | POA: Diagnosis not present

## 2016-12-20 DIAGNOSIS — Z87891 Personal history of nicotine dependence: Secondary | ICD-10-CM | POA: Insufficient documentation

## 2016-12-20 NOTE — Progress Notes (Signed)
Daily Session Note  Patient Details  Name: [name] MRN: [id number] Date of Birth: October 11, 1950 Referring Provider:     CARDIAC REHAB PHASE II ORIENTATION from 10/14/2016 in [place]  Referring Provider  Dr. Harl Bowie      Encounter Date: 12/20/2016  Check In:     Session Check In - 12/20/16 1100      Check-In   Location [facility]   Staff Present Aundra Dubin, RN, BSN;Nataley Bahri Luther Parody, BS, EP, Exercise Physiologist   Supervising physician immediately available to respond to emergencies See telemetry face sheet for immediately available MD   Medication changes reported     No   Fall or balance concerns reported    No   Warm-up and Cool-down Performed as group-led instruction   Resistance Training Performed Yes   VAD Patient? No     Pain Assessment   Currently in Pain? No/denies   Pain Score 0-No pain   Multiple Pain Sites No      Capillary Blood Glucose: No results found for this or any previous visit (from the past 24 hour(s)).    History  Smoking Status  . Former Smoker  . Packs/day: 0.25  . Years: 10.00  . Types: Cigarettes  . Start date: 09/20/1966  . Quit date: 09/20/1978  Smokeless Tobacco  . Never Used    Comment: Quit 2008    Goals Met:  Independence with exercise equipment Exercise tolerated well No report of cardiac concerns or symptoms Strength training completed today  Goals Unmet:  Not Applicable  Comments: Check out 1200   Dr. Kate Sable is Medical Director for [place] and Pulmonary Rehab.

## 2016-12-22 ENCOUNTER — Encounter (HOSPITAL_COMMUNITY)
Admission: RE | Admit: 2016-12-22 | Discharge: 2016-12-22 | Disposition: A | Discharge status: Home / Self Care | Payer: Medicare HMO | Source: Ambulatory Visit | Attending: Cardiology | Admitting: Cardiology

## 2016-12-23 NOTE — Progress Notes (Signed)
Cardiac Individual Treatment Plan  Patient Details  Name: [name] MRN: [id number] Date of Birth: [birth year] Referring Provider:     [place] from 10/14/2016 in [place]  Referring Provider  Dr. Harl Bowie      Initial Encounter Date:    CARDIAC REHAB PHASE II ORIENTATION from 10/14/2016 in [facility]  Date  10/14/16  Referring Provider  Dr. Harl Bowie      Visit Diagnosis: Chronic systolic CHF (congestive heart failure) ([place])  Patient's Home Medications on Admission:  Current Outpatient Prescriptions:  .  acetaminophen (TYLENOL) 500 MG tablet, Take 1,000 mg by mouth every 6 (six) hours as needed for moderate pain. , Disp: , Rfl:  .  aspirin 81 MG chewable tablet, Chew 1 tablet (81 mg total) by mouth daily., Disp: , Rfl:  .  atorvastatin (LIPITOR) 80 MG tablet, Take 1 tablet (80 mg total) by mouth daily at 6 PM., Disp: 90 tablet, Rfl: 3 .  carvedilol (COREG) 25 MG tablet, Take 1 tablet (25 mg total) by mouth 2 (two) times daily., Disp: 180 tablet, Rfl: 3 .  furosemide (LASIX) 40 MG tablet, Take 1 tablet (40 mg total) by mouth daily., Disp: 30 tablet, Rfl: 0 .  insulin glargine (LANTUS) 100 UNIT/ML injection, Inject 40 Units into the skin 2 (two) times daily., Disp: , Rfl:  .  losartan (COZAAR) 50 MG tablet, Take 50 mg by mouth daily., Disp: , Rfl:  .  Multiple Vitamin (MULTIVITAMIN) tablet, Take 1 tablet by mouth daily., Disp: , Rfl:  .  NOVOLOG 100 UNIT/ML injection, Inject 10 Units into the skin 3 (three) times daily with meals. , Disp: , Rfl:  .  polyethylene glycol (MIRALAX / GLYCOLAX) packet, Take 17 g by mouth daily., Disp: , Rfl:  .  ranitidine (ZANTAC) 150 MG tablet, Take 150 mg by mouth 2 (two) times daily., Disp: , Rfl:  .  spironolactone (ALDACTONE) 25 MG tablet, Take 1 tablet (25 mg total) by mouth daily., Disp: 90 tablet, Rfl: 3  Past Medical History: Past Medical History:  Diagnosis Date  .  Anemia   . Arthritis   . CAD (coronary artery disease)    a. LHC (03/2014) [place]: nl, LAD: diff dz proximal 20%, 30-40% dz mid vessel prior 2nd diagonal, LCx: 40% in OM1, 20-30% in OM2, RCA: 99% subtotal occlusion @ crux, TIMI 1 flow, L-R collaterals to distal vessel  . Chronic systolic heart failure ([place])    a. ECHO (03/2014): EF 25-30%, akinesis enteroanteroseptal myocardium, grade III DD b. RHC (03/2014) RA 4, RV 42/4, PA 44/14 (26), PCWP 21, PA 62% Fick CO/CI 6.9 / 3.4  . Diabetes mellitus   . Duodenal ulcer    Age 66  . Erosive esophagitis   . Gastritis   . Hypertension   . Ischemic cardiomyopathy   . Obese   . Short-term memory loss   . Thyroid disease   . TTP (thrombotic thrombocytopenic purpura) ([facility])     Tobacco Use: History  Smoking Status  . Former Smoker  . Packs/day: 0.25  . Years: 10.00  . Types: Cigarettes  . Start date: 09/20/1966  . Quit date: 09/20/1978  Smokeless Tobacco  . Never Used    Comment: Quit 2008    Labs: Recent Review Flowsheet Data    Labs for ITP Cardiac and Pulmonary Rehab Latest Ref Rng & Units 03/23/2014 03/24/2014 03/28/2014 03/28/2014 03/28/2014   Hemoglobin A1c <5.7 % - - - - -  PHART 7.350 - 7.450 7.393 - - 7.436 7.413   PCO2ART 35.0 - 45.0 mmHg 35.3 - - 43.8 40.7   HCO3 20.0 - 24.0 mEq/L 21.6 - 29.1(H) 29.4(H) 26.0(H)   TCO2 0 - 100 mmol/L 23 - _0 ACIDBASEDEF 0.0 - 2.0 mmol/L 3.0(H) - - - -   O2SAT % 94.0 68.5 62.0 84.0 90.0      Capillary Blood Glucose: Lab Results  Component Value Date   GLUCAP 101 (H) 06/14/2015   GLUCAP 83 05/28/2015   GLUCAP 156 (H) 04/15/2014   GLUCAP 107 (H) 04/15/2014   GLUCAP 73 04/14/2014     Exercise Target Goals:    Exercise Program Goal: Individual exercise prescription set with THRR, safety & activity barriers. Participant demonstrates ability to understand and report RPE using BORG scale, to self-measure pulse accurately, and to acknowledge the importance of the exercise  prescription.  Exercise Prescription Goal: Starting with aerobic activity 30 plus minutes a day, 3 days per week for initial exercise prescription. Provide home exercise prescription and guidelines that participant acknowledges understanding prior to discharge.  Activity Barriers & Risk Stratification:     Activity Barriers & Cardiac Risk Stratification - 10/14/16 1501      Activity Barriers & Cardiac Risk Stratification   Activity Barriers None   Cardiac Risk Stratification High      6 Minute Walk:     6 Minute Walk    Row Name 10/14/16 1458         6 Minute Walk   Phase Initial     Distance 700 feet  Patient had to stop and rest for 30 seconds     Distance % Change 0 %     Walk Time 6 minutes     # of Rest Breaks 0     MPH 1.3     METS 2.01     RPE 15     Perceived Dyspnea  13     VO2 Peak 4.08     Symptoms No     Resting HR 86 bpm     Resting BP 88/62     Max Ex. HR 98 bpm     Max Ex. BP 120/66     2 Minute Post BP 94/60        Oxygen Initial Assessment:   Oxygen Re-Evaluation:   Oxygen Discharge (Final Oxygen Re-Evaluation):   Initial Exercise Prescription:     Initial Exercise Prescription - 10/14/16 1400      Date of Initial Exercise RX and Referring Provider   Date 10/14/16   Referring Provider Dr. Harl Bowie     NuStep   Level 2   SPM 10   Minutes 20   METs 1.6     Arm Ergometer   Level 2   Watts 19   Minutes 15   METs 2.5     Prescription Details   Frequency (times per week) 3   Duration Progress to 30 minutes of continuous aerobic without signs/symptoms of physical distress     Intensity   THRR REST +  30   THRR 40-80% of Max Heartrate 113-126-140   Ratings of Perceived Exertion 11-13   Perceived Dyspnea 0-4     Progression   Progression Continue progressive overload as per policy without signs/symptoms or physical distress.     Resistance Training   Training Prescription Yes   Weight 1   Reps 10-12      Perform  Capillary Blood  Glucose checks as needed.  Exercise Prescription Changes:      Exercise Prescription Changes    Row Name 10/25/16 1400 11/22/16 1300 12/08/16 1000 12/20/16 1300       Response to Exercise   Blood Pressure (Admit) 100/50 104/64 122/70 98/60    Blood Pressure (Exercise) 110/66 124/66 126/74 108/66    Blood Pressure (Exit) 94/60 100/62 114/68 100/62    Heart Rate (Admit) 84 bpm 82 bpm 87 bpm 85 bpm    Heart Rate (Exercise) 94 bpm 11 bpm 101 bpm 95 bpm    Heart Rate (Exit) 90 bpm 90 bpm 95 bpm 89 bpm    Rating of Perceived Exertion (Exercise) '14 11 11 11    '$ Duration Progress to 30 minutes of continuous aerobic without signs/symptoms of physical distress Progress to 30 minutes of  aerobic without signs/symptoms of physical distress Progress to 30 minutes of  aerobic without signs/symptoms of physical distress Progress to 30 minutes of  aerobic without signs/symptoms of physical distress    Intensity Rest + 30 THRR unchanged THRR unchanged THRR unchanged      Progression   Progression Continue progressive overload as per policy without signs/symptoms or physical distress. Continue to progress workloads to maintain intensity without signs/symptoms of physical distress. Continue to progress workloads to maintain intensity without signs/symptoms of physical distress. Continue to progress workloads to maintain intensity without signs/symptoms of physical distress.      Resistance Training   Training Prescription Yes Yes Yes Yes    Weight '1 1 1 1    '$ Reps 10-12 10-15 10-15 10-15      NuStep   Level '2 1 2 2    '$ SPM 14 33 27 25    Minutes '15 15 15 15    '$ METs 3.71 3.71 3.71 3.72      Arm Ergometer   Level 1.'5 2 2 '$ 2.5    Watts '2 4 4 6    '$ Minutes '20 20 20 20    '$ METs 1.'5 2 2 '$ 2.5      Home Exercise Plan   Plans to continue exercise at [place] (comment) Home (comment) Home (comment)    Frequency Add 2 additional days to program exercise sessions. Add 2 additional days to  program exercise sessions. Add 2 additional days to program exercise sessions. Add 2 additional days to program exercise sessions.      Exercise Review   Progression No Yes Yes Yes       Exercise Comments:      Exercise Comments    Row Name 10/25/16 1441 11/22/16 1318 12/08/16 1048 12/20/16 1313     Exercise Comments Patient has only recently began CR and will be progressed in time.  Patient is doing well in CR. She has gone down in level on Nustep due to weakness in knees but has progressed in upper body strength due to teh arm ergometer  Patient is doing well and is progressing slowly  Patient is progressing slowly but well on her machines in CR.        Exercise Goals and Review:   Exercise Goals Re-Evaluation :     Exercise Goals Re-Evaluation    Row Name 11/23/16 1028 12/23/16 0821           Exercise Goal Re-Evaluation   Exercise Goals Review Increase Strenth and Stamina;Increase Physical Activity Increase Physical Activity;Increase Strenth and Stamina  Get around more; get stronger.      Comments Patient's strength and stamina are  increasing. She is able to do more around her home and feels better overall.  After 20 sessions, patient is progressing well with increased strength and stamina. She says this is helping her a lot and she feels better and stronger and is able to do more around the house.      Expected Outcomes Patient will continue to progress.  Patient will complete the program with continued increased strength, stamina, and activity.          Discharge Exercise Prescription (Final Exercise Prescription Changes):     Exercise Prescription Changes - 12/20/16 1300      Response to Exercise   Blood Pressure (Admit) 98/60   Blood Pressure (Exercise) 108/66   Blood Pressure (Exit) 100/62   Heart Rate (Admit) 85 bpm   Heart Rate (Exercise) 95 bpm   Heart Rate (Exit) 89 bpm   Rating of Perceived Exertion (Exercise) 11   Duration Progress to 30 minutes of   aerobic without signs/symptoms of physical distress   Intensity THRR unchanged     Progression   Progression Continue to progress workloads to maintain intensity without signs/symptoms of physical distress.     Resistance Training   Training Prescription Yes   Weight 1   Reps 10-15     NuStep   Level 2   SPM 25   Minutes 15   METs 3.72     Arm Ergometer   Level 2.5   Watts 6   Minutes 20   METs 2.5     Home Exercise Plan   Plans to continue exercise at Home (comment)   Frequency Add 2 additional days to program exercise sessions.     Exercise Review   Progression Yes      Nutrition:  Target Goals: Understanding of nutrition guidelines, daily intake of sodium '1500mg'$ , cholesterol '200mg'$ , calories 30% from fat and 7% or less from saturated fats, daily to have 5 or more servings of fruits and vegetables.  Biometrics:     Pre Biometrics - 10/14/16 1501      Pre Biometrics   Height '5\' 5"'$  (1.651 m)   Weight 244 lb 4.3 oz (110.8 kg)   Waist Circumference 47.5 inches   Hip Circumference 50.5 inches   Waist to Hip Ratio 0.94 %   BMI (Calculated) 40.7   Triceps Skinfold 26 mm   % Body Fat 49.7 %   Grip Strength 51.33 kg   Flexibility 0 in   Single Leg Stand 7 seconds       Nutrition Therapy Plan and Nutrition Goals:   Nutrition Discharge: Rate Your Plate Scores:     Nutrition Assessments - 10/14/16 1522      MEDFICTS Scores   Pre Score 50      Nutrition Goals Re-Evaluation:   Nutrition Goals Discharge (Final Nutrition Goals Re-Evaluation):   Psychosocial: Target Goals: Acknowledge presence or absence of significant depression and/or stress, maximize coping skills, provide positive support system. Participant is able to verbalize types and ability to use techniques and skills needed for reducing stress and depression.  Initial Review & Psychosocial Screening:     Initial Psych Review & Screening - 10/14/16 1530      Initial Review   Current  issues with Current Sleep Concerns     Family Dynamics   Good Support System? Yes     Barriers   Psychosocial barriers to participate in program There are no identifiable barriers or psychosocial needs.     Screening Interventions  Interventions Encouraged to exercise      Quality of Life Scores:     Quality of Life - 10/14/16 1502      Quality of Life Scores   Health/Function Pre 17.1 %   Socioeconomic Pre 24.13 %   Psych/Spiritual Pre 24 %   Family Pre 23.7 %   GLOBAL Pre 21.03 %      PHQ-9: Recent Review Flowsheet Data    Depression screen [facility] 2/9 10/14/2016 07/19/2016 01/30/2015 01/01/2015   Decreased Interest 0 0 0 0   Down, Depressed, Hopeless 0 0 0 0    PHQ - 2 Score 0 0 0 0   Altered sleeping 1 - - -   Tired, decreased energy 2 - - -   Change in appetite 2 - - -   Feeling bad or failure about yourself  0 - - -   Trouble concentrating 2 - - -   Moving slowly or fidgety/restless 0 - - -   Suicidal thoughts 0 - - -   PHQ-9 Score 7 - - -   Difficult doing work/chores Somewhat difficult - - -     Interpretation of Total Score  Total Score Depression Severity:  1-4 = Minimal depression, 5-9 = Mild depression, 10-14 = Moderate depression, 15-19 = Moderately severe depression, 20-27 = Severe depression   Psychosocial Evaluation and Intervention:     Psychosocial Evaluation - 10/14/16 1530      Psychosocial Evaluation & Interventions   Interventions Encouraged to exercise with the program and follow exercise prescription   Continue Psychosocial Services  No      Psychosocial Re-Evaluation:     Psychosocial Re-Evaluation    Row Name 10/28/16 0932 11/23/16 1029 12/23/16 0630         Psychosocial Re-Evaluation   Current issues with  - None Identified None Identified     Comments Patient's QOL score was 21.03 and her PHQ-9 score was 7. She does not feel she is depressed and does not feel she need counseling.  Patient's QOL was 21.03 scoring lower in  health function and her PHQ-9 was 7. She says she is not depressed.   -     Expected Outcomes  -  - Patient's QOL and PHQ-9 scores will improve at discharge with no psychosocial issues identified.      Interventions Encouraged to attend Cardiac Rehabilitation for the exercise Encouraged to attend Cardiac Rehabilitation for the exercise Encouraged to attend Cardiac Rehabilitation for the exercise     Continue Psychosocial Services  No No Follow up required No Follow up required        Psychosocial Discharge (Final Psychosocial Re-Evaluation):     Psychosocial Re-Evaluation - 12/23/16 1601      Psychosocial Re-Evaluation   Current issues with None Identified   Expected Outcomes Patient's QOL and PHQ-9 scores will improve at discharge with no psychosocial issues identified.    Interventions Encouraged to attend Cardiac Rehabilitation for the exercise   Continue Psychosocial Services  No Follow up required      Vocational Rehabilitation: Provide vocational rehab assistance to qualifying candidates.   Vocational Rehab Evaluation & Intervention:     Vocational Rehab - 10/14/16 1502      Initial Vocational Rehab Evaluation & Intervention   Assessment shows need for Vocational Rehabilitation No      Education: Education Goals: Education classes will be provided on a weekly basis, covering required topics. Participant will state understanding/return demonstration of topics presented.  Learning Barriers/Preferences:  Learning Barriers/Preferences - 10/14/16 1501      Learning Barriers/Preferences   Learning Preferences Pictoral;Video;Written Material      Education Topics: Hypertension, Hypertension Reduction -Define heart disease and high blood pressure. Discus how high blood pressure affects the body and ways to reduce high blood pressure.   CARDIAC REHAB PHASE II EXERCISE from 12/15/2016 in [facility]  Date  10/20/16  Educator  DC  Instruction  Review Code  2- meets goals/outcomes      Exercise and Your Heart -Discuss why it is important to exercise, the FITT principles of exercise, normal and abnormal responses to exercise, and how to exercise safely.   CARDIAC REHAB PHASE II EXERCISE from 12/15/2016 in [facility]  Date  10/27/16  Educator  DC  Instruction Review Code  2- meets goals/outcomes      Angina -Discuss definition of angina, causes of angina, treatment of angina, and how to decrease risk of having angina.   CARDIAC REHAB PHASE II EXERCISE from 12/15/2016 in [place] [facility]  Date  11/03/16  Educator  DJ  Instruction Review Code  2- meets goals/outcomes      Cardiac Medications -Review what the following cardiac medications are used for, how they affect the body, and side effects that may occur when taking the medications.  Medications include Aspirin, Beta blockers, calcium channel blockers, ACE Inhibitors, angiotensin receptor blockers, diuretics, digoxin, and antihyperlipidemics.   Congestive Heart Failure -Discuss the definition of CHF, how to live with CHF, the signs and symptoms of CHF, and how keep track of weight and sodium intake.   CARDIAC REHAB PHASE II EXERCISE from 12/15/2016 in [facility] [facility]  Date  11/17/16  Educator  DC  Instruction Review Code  2- meets goals/outcomes      Heart Disease and Intimacy -Discus the effect sexual activity has on the heart, how changes occur during intimacy as we age, and safety during sexual activity.   CARDIAC REHAB PHASE II EXERCISE from 12/15/2016 in [place] [facility]  Date  11/24/16  Educator  Dc  Instruction Review Code  2- meets goals/outcomes      Smoking Cessation / COPD -Discuss different methods to quit smoking, the health benefits of quitting smoking, and the definition of COPD.   Nutrition I: Fats -Discuss the types of cholesterol, what cholesterol does to the heart, and  how cholesterol levels can be controlled.   Nutrition II: Labels -Discuss the different components of food labels and how to read food label   CARDIAC REHAB PHASE II EXERCISE from 12/15/2016 in [place] [facility]  Date  12/15/16  Educator  DC  Instruction Review Code  2- meets goals/outcomes      Heart Parts and Heart Disease -Discuss the anatomy of the heart, the pathway of blood circulation through the heart, and these are affected by heart disease.   Stress I: Signs and Symptoms -Discuss the causes of stress, how stress may lead to anxiety and depression, and ways to limit stress.   Stress II: Relaxation -Discuss different types of relaxation techniques to limit stress.   Warning Signs of Stroke / TIA -Discuss definition of a stroke, what the signs and symptoms are of a stroke, and how to identify when someone is having stroke.   Knowledge Questionnaire Score:     Knowledge Questionnaire Score - 10/14/16 1502      Knowledge Questionnaire Score   Pre Score 16/24      Core  Components/Risk Factors/Patient Goals at Admission:     Personal Goals and Risk Factors at Admission - 10/14/16 1522      Core Components/Risk Factors/Patient Goals on Admission    Weight Management Yes   Admit Weight 244 lb 8 oz (110.9 kg)   Goal Weight: Short Term 234 lb 8 oz (106.4 kg)   Goal Weight: Long Term 224 lb 8 oz (101.8 kg)   Expected Outcomes Short Term: Continue to assess and modify interventions until short term weight is achieved;Long Term: Adherence to nutrition and physical activity/exercise program aimed toward attainment of established weight goal   Sedentary Yes   Intervention Provide advice, education, support and counseling about physical activity/exercise needs.;Develop an individualized exercise prescription for aerobic and resistive training based on initial evaluation findings, risk stratification, comorbidities and participant's personal goals.   Expected  Outcomes Achievement of increased cardiorespiratory fitness and enhanced flexibility, muscular endurance and strength shown through measurements of functional capacity and personal statement of participant.   Increase Strength and Stamina Yes   Intervention Provide advice, education, support and counseling about physical activity/exercise needs.;Develop an individualized exercise prescription for aerobic and resistive training based on initial evaluation findings, risk stratification, comorbidities and participant's personal goals.   Expected Outcomes Achievement of increased cardiorespiratory fitness and enhanced flexibility, muscular endurance and strength shown through measurements of functional capacity and personal statement of participant.   Diabetes Yes   Intervention Provide education about signs/symptoms and action to take for hypo/hyperglycemia.;Provide education about proper nutrition, including hydration, and aerobic/resistive exercise prescription along with prescribed medications to achieve blood glucose in normal ranges: Fasting glucose 65-99 mg/dL   Expected Outcomes Short Term: Participant verbalizes understanding of the signs/symptoms and immediate care of hyper/hypoglycemia, proper foot care and importance of medication, aerobic/resistive exercise and nutrition plan for blood glucose control.;Long Term: Attainment of HbA1C < 7%.   Heart Failure Yes   Intervention Provide a combined exercise and nutrition program that is supplemented with education, support and counseling about heart failure. Directed toward relieving symptoms such as shortness of breath, decreased exercise tolerance, and extremity edema.   Expected Outcomes Long term: Adoption of self-care skills and reduction of barriers for early signs and symptoms recognition and intervention leading to self-care maintenance.;Short term: Attendance in program 2-3 days a week with increased exercise capacity. Reported lower sodium  intake. Reported increased fruit and vegetable intake. Reports medication compliance.   Personal Goal Other Yes   Personal Goal Get around more, Get stronger, lose weight   Intervention Attend CR 3xweek and supplement with 2xweek at home   Expected Outcomes Reach personal goals.       Core Components/Risk Factors/Patient Goals Review:      Goals and Risk Factor Review    Row Name 10/14/16 1529 10/28/16 0928 11/23/16 1023 12/23/16 0818       Core Components/Risk Factors/Patient Goals Review   Personal Goals Review Weight Management/Obesity;Sedentary;Increase Strength and Stamina;Heart Failure;Diabetes Weight Management/Obesity;Sedentary;Increase Strength and Stamina;Diabetes  Get around more; Get stronger; lose 50 lbs.  Weight Management/Obesity;Diabetes;Heart Failure  Get around more; get stronger; lose 50 lbs.  Weight Management/Obesity;Diabetes  Lose 50 lbs.     Review  - Patient has attended 5 sessions. Will continue to monitor for progress.  Patient has completed 13 sessions. She has maintained her weight. She has progressed well in the program. She says she is feeling stronger and feels like doing more at home.  Patient has completed 20 sessions gaining 3.3 lbs. Her reported fasting glucose readings  are usually WNL.     Expected Outcomes  - Patient will continue to attend sessions meeting her personal goals.  Patient will complete the program meeting her personal goals.  Patient will complete the program and start meeting her weight loss goal.        Core Components/Risk Factors/Patient Goals at Discharge (Final Review):      Goals and Risk Factor Review - 12/23/16 0818      Core Components/Risk Factors/Patient Goals Review   Personal Goals Review Weight Management/Obesity;Diabetes  Lose 50 lbs.    Review Patient has completed 20 sessions gaining 3.3 lbs. Her reported fasting glucose readings are usually WNL.    Expected Outcomes Patient will complete the program and start  meeting her weight loss goal.       ITP Comments:     ITP Comments    Row Name 11/22/16 1503           ITP Comments Patient met with Registered Dietitian to discuss nutrition topics including: Heart healthty eating, heart health cooking and make smart choices when shopping; Portion control; weight management; and hydration. Patient attended a group session with the hospital chaplian called [facility] to discuss and share how her recent cardiac diagnosis has effected her life.          Comments: ITP 30 Day REVIEW Patient doing well in the program. Will continue to monitor for progress.

## 2016-12-24 ENCOUNTER — Encounter (HOSPITAL_COMMUNITY)
Admission: RE | Admit: 2016-12-24 | Discharge: 2016-12-24 | Disposition: A | Discharge status: Home / Self Care | Payer: Medicare HMO | Source: Ambulatory Visit | Attending: Cardiology | Admitting: Cardiology

## 2016-12-24 DIAGNOSIS — I5022 Chronic systolic (congestive) heart failure: Secondary | ICD-10-CM

## 2016-12-24 DIAGNOSIS — Z794 Long term (current) use of insulin: Secondary | ICD-10-CM | POA: Diagnosis not present

## 2016-12-24 DIAGNOSIS — Z8711 Personal history of peptic ulcer disease: Secondary | ICD-10-CM | POA: Diagnosis not present

## 2016-12-24 DIAGNOSIS — Z79899 Other long term (current) drug therapy: Secondary | ICD-10-CM | POA: Diagnosis not present

## 2016-12-24 DIAGNOSIS — I255 Ischemic cardiomyopathy: Secondary | ICD-10-CM | POA: Diagnosis not present

## 2016-12-24 DIAGNOSIS — I2510 Atherosclerotic heart disease of native coronary artery without angina pectoris: Secondary | ICD-10-CM | POA: Diagnosis not present

## 2016-12-24 DIAGNOSIS — M1990 Unspecified osteoarthritis, unspecified site: Secondary | ICD-10-CM | POA: Diagnosis not present

## 2016-12-24 DIAGNOSIS — E119 Type 2 diabetes mellitus without complications: Secondary | ICD-10-CM | POA: Diagnosis not present

## 2016-12-24 DIAGNOSIS — I110 Hypertensive heart disease with heart failure: Secondary | ICD-10-CM | POA: Diagnosis not present

## 2016-12-24 NOTE — Progress Notes (Signed)
Daily Session Note  Patient Details  Name: [name] MRN: [id number] Date of Birth: 1951/07/19 Referring Provider:     CARDIAC REHAB PHASE II ORIENTATION from 10/14/2016 in [place]  Referring Provider  Dr. Harl Bowie      Encounter Date: 12/24/2016  Check In:     Session Check In - 12/24/16 1107      Check-In   Location [facility]   Staff Present Aundra Dubin, RN, BSN;Danzig Macgregor Luther Parody, BS, EP, Exercise Physiologist   Supervising physician immediately available to respond to emergencies See telemetry face sheet for immediately available MD   Medication changes reported     No   Fall or balance concerns reported    No   Warm-up and Cool-down Performed as group-led instruction   Resistance Training Performed Yes   VAD Patient? No     Pain Assessment   Currently in Pain? No/denies   Pain Score 0-No pain   Multiple Pain Sites No      Capillary Blood Glucose: No results found for this or any previous visit (from the past 24 hour(s)).    History  Smoking Status  . Former Smoker  . Packs/day: 0.25  . Years: 10.00  . Types: Cigarettes  . Start date: 09/20/1966  . Quit date: 09/20/1978  Smokeless Tobacco  . Never Used    Comment: Quit 2008    Goals Met:  Independence with exercise equipment Exercise tolerated well No report of cardiac concerns or symptoms Strength training completed today  Goals Unmet:  Not Applicable  Comments: Check out 1200   Dr. Kate Sable is Medical Director for [facility] and Pulmonary Rehab.

## 2016-12-27 ENCOUNTER — Encounter (HOSPITAL_COMMUNITY)
Admission: RE | Admit: 2016-12-27 | Discharge: 2016-12-27 | Disposition: A | Discharge status: Home / Self Care | Payer: Medicare HMO | Source: Ambulatory Visit | Attending: Cardiology | Admitting: Cardiology

## 2016-12-27 DIAGNOSIS — Z794 Long term (current) use of insulin: Secondary | ICD-10-CM | POA: Diagnosis not present

## 2016-12-27 DIAGNOSIS — I255 Ischemic cardiomyopathy: Secondary | ICD-10-CM | POA: Diagnosis not present

## 2016-12-27 DIAGNOSIS — Z8711 Personal history of peptic ulcer disease: Secondary | ICD-10-CM | POA: Diagnosis not present

## 2016-12-27 DIAGNOSIS — M1990 Unspecified osteoarthritis, unspecified site: Secondary | ICD-10-CM | POA: Diagnosis not present

## 2016-12-27 DIAGNOSIS — Z79899 Other long term (current) drug therapy: Secondary | ICD-10-CM | POA: Diagnosis not present

## 2016-12-27 DIAGNOSIS — I5022 Chronic systolic (congestive) heart failure: Secondary | ICD-10-CM

## 2016-12-27 DIAGNOSIS — E119 Type 2 diabetes mellitus without complications: Secondary | ICD-10-CM | POA: Diagnosis not present

## 2016-12-27 DIAGNOSIS — I110 Hypertensive heart disease with heart failure: Secondary | ICD-10-CM | POA: Diagnosis not present

## 2016-12-27 DIAGNOSIS — I2510 Atherosclerotic heart disease of native coronary artery without angina pectoris: Secondary | ICD-10-CM | POA: Diagnosis not present

## 2016-12-27 NOTE — Progress Notes (Addendum)
Daily Session Note  Patient Details  Name: [name] MRN: [id number] Date of Birth: 08-19-1951 Referring Provider:     CARDIAC REHAB PHASE II ORIENTATION from 10/14/2016 in [place]  Referring Provider  Dr. Harl Bowie      Encounter Date: 12/27/2016  Check In:     Session Check In - 12/27/16 1100      Check-In   Location [facility]   Staff Present Diane Angelina Pih, MS, EP, CHC, Exercise Physiologist;Gregory Luther Parody, BS, EP, Exercise Physiologist;Esha Fincher Wynetta Emery, RN, BSN   Supervising physician immediately available to respond to emergencies See telemetry face sheet for immediately available MD   Medication changes reported     No   Fall or balance concerns reported    No   Warm-up and Cool-down Performed as group-led instruction   Resistance Training Performed Yes   VAD Patient? No     Pain Assessment   Currently in Pain? No/denies   Pain Score 0-No pain   Multiple Pain Sites No      Capillary Blood Glucose: No results found for this or any previous visit (from the past 24 hour(s)).    History  Smoking Status  . Former Smoker  . Packs/day: 0.25  . Years: 10.00  . Types: Cigarettes  . Start date: 09/20/1966  . Quit date: 09/20/1978  Smokeless Tobacco  . Never Used    Comment: Quit 2008    Goals Met:  Independence with exercise equipment Exercise tolerated well No report of cardiac concerns or symptoms Strength training completed today  Goals Unmet:  Not Applicable  Comments: Check out 1200.   Dr. Kate Sable is Medical Director for [facility] and Pulmonary Rehab.

## 2016-12-29 ENCOUNTER — Encounter (HOSPITAL_COMMUNITY): Payer: Medicare HMO

## 2016-12-31 ENCOUNTER — Encounter (HOSPITAL_COMMUNITY): Payer: Medicare HMO

## 2017-01-03 ENCOUNTER — Encounter (HOSPITAL_COMMUNITY): Payer: Medicare HMO

## 2017-01-04 DIAGNOSIS — I10 Essential (primary) hypertension: Secondary | ICD-10-CM | POA: Diagnosis not present

## 2017-01-04 DIAGNOSIS — I2510 Atherosclerotic heart disease of native coronary artery without angina pectoris: Secondary | ICD-10-CM | POA: Diagnosis not present

## 2017-01-04 DIAGNOSIS — E1165 Type 2 diabetes mellitus with hyperglycemia: Secondary | ICD-10-CM | POA: Diagnosis not present

## 2017-01-04 DIAGNOSIS — I5042 Chronic combined systolic (congestive) and diastolic (congestive) heart failure: Secondary | ICD-10-CM | POA: Diagnosis not present

## 2017-01-05 ENCOUNTER — Encounter (HOSPITAL_COMMUNITY): Payer: Medicare HMO

## 2017-01-07 ENCOUNTER — Telehealth: Payer: Self-pay | Admitting: Cardiology

## 2017-01-07 ENCOUNTER — Encounter (HOSPITAL_COMMUNITY): Payer: Medicare HMO

## 2017-01-07 MED ORDER — FUROSEMIDE 40 MG PO TABS: 40.0000 mg | ORAL_TABLET | Freq: Every day | ORAL | 6 refills | Status: DC

## 2017-01-07 NOTE — Telephone Encounter (Signed)
furosemide (LASIX) 40 MG tablet  [facility] pharmacy told her that we refused to fill medication for her and she is out.

## 2017-01-10 ENCOUNTER — Encounter (HOSPITAL_COMMUNITY)
Admission: RE | Admit: 2017-01-10 | Discharge: 2017-01-10 | Disposition: A | Discharge status: Home / Self Care | Payer: Medicare HMO | Source: Ambulatory Visit | Attending: Cardiology | Admitting: Cardiology

## 2017-01-10 DIAGNOSIS — E119 Type 2 diabetes mellitus without complications: Secondary | ICD-10-CM | POA: Diagnosis not present

## 2017-01-10 DIAGNOSIS — I5022 Chronic systolic (congestive) heart failure: Secondary | ICD-10-CM | POA: Diagnosis not present

## 2017-01-10 DIAGNOSIS — I255 Ischemic cardiomyopathy: Secondary | ICD-10-CM | POA: Diagnosis not present

## 2017-01-10 DIAGNOSIS — I110 Hypertensive heart disease with heart failure: Secondary | ICD-10-CM | POA: Diagnosis not present

## 2017-01-10 DIAGNOSIS — Z794 Long term (current) use of insulin: Secondary | ICD-10-CM | POA: Diagnosis not present

## 2017-01-10 DIAGNOSIS — Z79899 Other long term (current) drug therapy: Secondary | ICD-10-CM | POA: Diagnosis not present

## 2017-01-10 DIAGNOSIS — M1990 Unspecified osteoarthritis, unspecified site: Secondary | ICD-10-CM | POA: Diagnosis not present

## 2017-01-10 DIAGNOSIS — Z8711 Personal history of peptic ulcer disease: Secondary | ICD-10-CM | POA: Diagnosis not present

## 2017-01-10 DIAGNOSIS — I2510 Atherosclerotic heart disease of native coronary artery without angina pectoris: Secondary | ICD-10-CM | POA: Diagnosis not present

## 2017-01-10 NOTE — Progress Notes (Signed)
Daily Session Note  Patient Details  Name: [name] MRN: [id number] Date of Birth: 06/18/1951 Referring Provider:     CARDIAC REHAB PHASE II ORIENTATION from 10/14/2016 in [place] [facility]  Referring Provider  Dr. Branch      Encounter Date: 01/10/2017  Check In:     Session Check In - 01/10/17 1057      Check-In   Location [facility]   Staff Present Debra Johnson, RN, BSN;Lex Linhares, BS, EP, Exercise Physiologist   Supervising physician immediately available to respond to emergencies See telemetry face sheet for immediately available MD   Medication changes reported     No   Fall or balance concerns reported    No   Warm-up and Cool-down Performed as group-led instruction   Resistance Training Performed Yes   VAD Patient? No     Pain Assessment   Currently in Pain? No/denies   Pain Score 0-No pain   Multiple Pain Sites No      Capillary Blood Glucose: No results found for this or any previous visit (from the past 24 hour(s)).    History  Smoking Status  . Former Smoker  . Packs/day: 0.25  . Years: 10.00  . Types: Cigarettes  . Start date: 09/20/1966  . Quit date: 09/20/1978  Smokeless Tobacco  . Never Used    Comment: Quit 2008    Goals Met:  Independence with exercise equipment Exercise tolerated well No report of cardiac concerns or symptoms Strength training completed today  Goals Unmet:  Not Applicable  Comments: Check out 1200   Dr. Suresh Koneswaran is Medical Director for [facility] and [facility]. 

## 2017-01-12 ENCOUNTER — Encounter (HOSPITAL_COMMUNITY)
Admission: RE | Admit: 2017-01-12 | Discharge: 2017-01-12 | Disposition: A | Discharge status: Home / Self Care | Payer: Medicare HMO | Source: Ambulatory Visit | Attending: Cardiology | Admitting: Cardiology

## 2017-01-12 DIAGNOSIS — E119 Type 2 diabetes mellitus without complications: Secondary | ICD-10-CM | POA: Diagnosis not present

## 2017-01-12 DIAGNOSIS — I110 Hypertensive heart disease with heart failure: Secondary | ICD-10-CM | POA: Diagnosis not present

## 2017-01-12 DIAGNOSIS — Z79899 Other long term (current) drug therapy: Secondary | ICD-10-CM | POA: Diagnosis not present

## 2017-01-12 DIAGNOSIS — I5022 Chronic systolic (congestive) heart failure: Secondary | ICD-10-CM

## 2017-01-12 DIAGNOSIS — Z794 Long term (current) use of insulin: Secondary | ICD-10-CM | POA: Diagnosis not present

## 2017-01-12 DIAGNOSIS — I255 Ischemic cardiomyopathy: Secondary | ICD-10-CM | POA: Diagnosis not present

## 2017-01-12 DIAGNOSIS — I2510 Atherosclerotic heart disease of native coronary artery without angina pectoris: Secondary | ICD-10-CM | POA: Diagnosis not present

## 2017-01-12 DIAGNOSIS — M1990 Unspecified osteoarthritis, unspecified site: Secondary | ICD-10-CM | POA: Diagnosis not present

## 2017-01-12 DIAGNOSIS — Z8711 Personal history of peptic ulcer disease: Secondary | ICD-10-CM | POA: Diagnosis not present

## 2017-01-12 NOTE — Progress Notes (Signed)
Daily Session Note  Patient Details  Name: [name] MRN: [id number] Date of Birth: 22-Feb-1951 Referring Provider:     CARDIAC REHAB PHASE II ORIENTATION from 10/14/2016 in [place]  Referring Provider  Dr. Harl Bowie      Encounter Date: 01/12/2017  Check In:     Session Check In - 01/12/17 1106      Check-In   Location [facility]   Staff Present Aundra Dubin, RN, BSN;Gayla Benn Luther Parody, BS, EP, Exercise Physiologist   Supervising physician immediately available to respond to emergencies See telemetry face sheet for immediately available MD   Medication changes reported     No   Fall or balance concerns reported    No   Warm-up and Cool-down Performed as group-led instruction   Resistance Training Performed Yes   VAD Patient? No     Pain Assessment   Currently in Pain? No/denies   Pain Score 0-No pain   Multiple Pain Sites No      Capillary Blood Glucose: No results found for this or any previous visit (from the past 24 hour(s)).    History  Smoking Status  . Former Smoker  . Packs/day: 0.25  . Years: 10.00  . Types: Cigarettes  . Start date: 09/20/1966  . Quit date: 09/20/1978  Smokeless Tobacco  . Never Used    Comment: Quit 2008    Goals Met:  Independence with exercise equipment Exercise tolerated well No report of cardiac concerns or symptoms Strength training completed today  Goals Unmet:  Not Applicable  Comments: Check out 1200   Dr. Kate Sable is Medical Director for [facility] and Pulmonary Rehab.

## 2017-01-12 NOTE — Progress Notes (Signed)
Cardiac Individual Treatment Plan  Patient Details  Name: KAILIANA GRANQUIST MRN: [id number] Date of Birth: 1951/01/18 Referring Provider:     [facility] from 10/14/2016 in [place]  Referring Provider  Dr. Harl Bowie      Initial Encounter Date:    CARDIAC REHAB PHASE II ORIENTATION from 10/14/2016 in [facility]  Date  10/14/16  Referring Provider  Dr. Harl Bowie      Visit Diagnosis: Chronic systolic CHF (congestive heart failure) ([place])  Patient's Home Medications on Admission:  Current Outpatient Prescriptions:  .  acetaminophen (TYLENOL) 500 MG tablet, Take 1,000 mg by mouth every 6 (six) hours as needed for moderate pain. , Disp: , Rfl:  .  aspirin 81 MG chewable tablet, Chew 1 tablet (81 mg total) by mouth daily., Disp: , Rfl:  .  atorvastatin (LIPITOR) 80 MG tablet, Take 1 tablet (80 mg total) by mouth daily at 6 PM., Disp: 90 tablet, Rfl: 3 .  carvedilol (COREG) 25 MG tablet, Take 1 tablet (25 mg total) by mouth 2 (two) times daily., Disp: 180 tablet, Rfl: 3 .  furosemide (LASIX) 40 MG tablet, Take 1 tablet (40 mg total) by mouth daily., Disp: 30 tablet, Rfl: 6 .  insulin glargine (LANTUS) 100 UNIT/ML injection, Inject 40 Units into the skin 2 (two) times daily., Disp: , Rfl:  .  losartan (COZAAR) 50 MG tablet, Take 50 mg by mouth daily., Disp: , Rfl:  .  Multiple Vitamin (MULTIVITAMIN) tablet, Take 1 tablet by mouth daily., Disp: , Rfl:  .  NOVOLOG 100 UNIT/ML injection, Inject 10 Units into the skin 3 (three) times daily with meals. , Disp: , Rfl:  .  polyethylene glycol (MIRALAX / GLYCOLAX) packet, Take 17 g by mouth daily., Disp: , Rfl:  .  ranitidine (ZANTAC) 150 MG tablet, Take 150 mg by mouth 2 (two) times daily., Disp: , Rfl:  .  spironolactone (ALDACTONE) 25 MG tablet, Take 1 tablet (25 mg total) by mouth daily., Disp: 90 tablet, Rfl: 3  Past Medical History: Past Medical History:  Diagnosis Date  .  Anemia   . Arthritis   . CAD (coronary artery disease)    a. LHC (03/2014) [place]: nl, LAD: diff dz proximal 20%, 30-40% dz mid vessel prior 2nd diagonal, LCx: 40% in OM1, 20-30% in OM2, RCA: 99% subtotal occlusion @ crux, TIMI 1 flow, L-R collaterals to distal vessel  . Chronic systolic heart failure ([place])    a. ECHO (03/2014): EF 25-30%, akinesis enteroanteroseptal myocardium, grade III DD b. RHC (03/2014) RA 4, RV 42/4, PA 44/14 (26), PCWP 21, PA 62% Fick CO/CI 6.9 / 3.4  . Diabetes mellitus   . Duodenal ulcer    Age 16  . Erosive esophagitis   . Gastritis   . Hypertension   . Ischemic cardiomyopathy   . Obese   . Short-term memory loss   . Thyroid disease   . TTP (thrombotic thrombocytopenic purpura) ([facility])     Tobacco Use: History  Smoking Status  . Former Smoker  . Packs/day: 0.25  . Years: 10.00  . Types: Cigarettes  . Start date: 09/20/1966  . Quit date: 09/20/1978  Smokeless Tobacco  . Never Used    Comment: Quit 2008    Labs: Recent Review Flowsheet Data    Labs for ITP Cardiac and Pulmonary Rehab Latest Ref Rng & Units 03/23/2014 03/24/2014 03/28/2014 03/28/2014 03/28/2014   Hemoglobin A1c <5.7 % - - - - -  PHART 7.350 - 7.450 7.393 - - 7.436 7.413   PCO2ART 35.0 - 45.0 mmHg 35.3 - - 43.8 40.7   HCO3 20.0 - 24.0 mEq/L 21.6 - 29.1(H) 29.4(H) 26.0(H)   TCO2 0 - 100 mmol/L 23 - _0 ACIDBASEDEF 0.0 - 2.0 mmol/L 3.0(H) - - - -   O2SAT % 94.0 68.5 62.0 84.0 90.0      Capillary Blood Glucose: Lab Results  Component Value Date   GLUCAP 101 (H) 06/14/2015   GLUCAP 83 05/28/2015   GLUCAP 156 (H) 04/15/2014   GLUCAP 107 (H) 04/15/2014   GLUCAP 73 04/14/2014     Exercise Target Goals:    Exercise Program Goal: Individual exercise prescription set with THRR, safety & activity barriers. Participant demonstrates ability to understand and report RPE using BORG scale, to self-measure pulse accurately, and to acknowledge the importance of the exercise  prescription.  Exercise Prescription Goal: Starting with aerobic activity 30 plus minutes a day, 3 days per week for initial exercise prescription. Provide home exercise prescription and guidelines that participant acknowledges understanding prior to discharge.  Activity Barriers & Risk Stratification:     Activity Barriers & Cardiac Risk Stratification - 10/14/16 1501      Activity Barriers & Cardiac Risk Stratification   Activity Barriers None   Cardiac Risk Stratification High      6 Minute Walk:     6 Minute Walk    Row Name 10/14/16 1458         6 Minute Walk   Phase Initial     Distance 700 feet  Patient had to stop and rest for 30 seconds     Distance % Change 0 %     Walk Time 6 minutes     # of Rest Breaks 0     MPH 1.3     METS 2.01     RPE 15     Perceived Dyspnea  13     VO2 Peak 4.08     Symptoms No     Resting HR 86 bpm     Resting BP 88/62     Max Ex. HR 98 bpm     Max Ex. BP 120/66     2 Minute Post BP 94/60        Oxygen Initial Assessment:   Oxygen Re-Evaluation:   Oxygen Discharge (Final Oxygen Re-Evaluation):   Initial Exercise Prescription:     Initial Exercise Prescription - 10/14/16 1400      Date of Initial Exercise RX and Referring Provider   Date 10/14/16   Referring Provider Dr. Harl Bowie     NuStep   Level 2   SPM 10   Minutes 20   METs 1.6     Arm Ergometer   Level 2   Watts 19   Minutes 15   METs 2.5     Prescription Details   Frequency (times per week) 3   Duration Progress to 30 minutes of continuous aerobic without signs/symptoms of physical distress     Intensity   THRR REST +  30   THRR 40-80% of Max Heartrate 113-126-140   Ratings of Perceived Exertion 11-13   Perceived Dyspnea 0-4     Progression   Progression Continue progressive overload as per policy without signs/symptoms or physical distress.     Resistance Training   Training Prescription Yes   Weight 1   Reps 10-12      Perform  Capillary Blood  Glucose checks as needed.  Exercise Prescription Changes:      Exercise Prescription Changes    Row Name 10/25/16 1400 11/22/16 1300 12/08/16 1000 12/20/16 1300 01/10/17 1400     Response to Exercise   Blood Pressure (Admit) 100/50 104/64 122/70 98/60 124/68   Blood Pressure (Exercise) 110/66 124/66 126/74 108/66 116/66   Blood Pressure (Exit) 94/60 100/62 114/68 100/62 100/62   Heart Rate (Admit) 84 bpm 82 bpm 87 bpm 85 bpm 87 bpm   Heart Rate (Exercise) 94 bpm 11 bpm 101 bpm 95 bpm 103 bpm   Heart Rate (Exit) 90 bpm 90 bpm 95 bpm 89 bpm 95 bpm   Rating of Perceived Exertion (Exercise) '14 11 11 11 12   '$ Duration Progress to 30 minutes of continuous aerobic without signs/symptoms of physical distress Progress to 30 minutes of  aerobic without signs/symptoms of physical distress Progress to 30 minutes of  aerobic without signs/symptoms of physical distress Progress to 30 minutes of  aerobic without signs/symptoms of physical distress Progress to 30 minutes of  aerobic without signs/symptoms of physical distress   Intensity Rest + 30 THRR unchanged THRR unchanged THRR unchanged THRR unchanged     Progression   Progression Continue progressive overload as per policy without signs/symptoms or physical distress. Continue to progress workloads to maintain intensity without signs/symptoms of physical distress. Continue to progress workloads to maintain intensity without signs/symptoms of physical distress. Continue to progress workloads to maintain intensity without signs/symptoms of physical distress. Continue to progress workloads to maintain intensity without signs/symptoms of physical distress.     Resistance Training   Training Prescription Yes Yes Yes Yes Yes   Weight '1 1 1 1 1   '$ Reps 10-12 10-15 10-15 10-15 10-15     NuStep   Level '2 1 2 2 2   '$ SPM 14 33 '27 25 20   '$ Minutes '15 15 15 15 15   '$ METs 3.71 3.71 3.71 3.72 3.72     Arm Ergometer   Level 1.'5 2 2 '$ 2.5 2.5    Watts '2 4 4 6 18   '$ Minutes '20 20 20 20 20   '$ METs 1.'5 2 2 '$ 2.5 2.5     Home Exercise Plan   Plans to continue exercise at [facility] (comment) Home (comment) Home (comment) Home (comment)   Frequency Add 2 additional days to program exercise sessions. Add 2 additional days to program exercise sessions. Add 2 additional days to program exercise sessions. Add 2 additional days to program exercise sessions. Add 2 additional days to program exercise sessions.     Exercise Review   Progression No Yes Yes Yes Yes      Exercise Comments:      Exercise Comments    Row Name 10/25/16 1441 11/22/16 1318 12/08/16 1048 12/20/16 1313 01/10/17 1440   Exercise Comments Patient has only recently began CR and will be progressed in time.  Patient is doing well in CR. She has gone down in level on Nustep due to weakness in knees but has progressed in upper body strength due to teh arm ergometer  Patient is doing well and is progressing slowly  Patient is progressing slowly but well on her machines in CR.  Patient has lacked in attendance but she has increased her watts but not her levels.       Exercise Goals and Review:   Exercise Goals Re-Evaluation :     Exercise Goals Re-Evaluation    Row Name 11/23/16 1028  12/23/16 3244 01/12/17 1513         Exercise Goal Re-Evaluation   Exercise Goals Review Increase Strenth and Stamina;Increase Physical Activity Increase Physical Activity;Increase Strenth and Stamina  Get around more; get stronger. Increase Physical Activity;Increase Strenth and Stamina  Get around more; get stronger.     Comments Patient's strength and stamina are increasing. She is able to do more around her home and feels better overall.  After 20 sessions, patient is progressing well with increased strength and stamina. She says this is helping her a lot and she feels better and stronger and is able to do more around the house. After completing 24 sessions, patient has had some progression  but has been inconsistent in her attendance impeding her progress. She says she feels stronger and has more energy.     Expected Outcomes Patient will continue to progress.  Patient will complete the program with continued increased strength, stamina, and activity. Patient will complete the program with increased strength, stamina, and activity.         Discharge Exercise Prescription (Final Exercise Prescription Changes):     Exercise Prescription Changes - 01/10/17 1400      Response to Exercise   Blood Pressure (Admit) 124/68   Blood Pressure (Exercise) 116/66   Blood Pressure (Exit) 100/62   Heart Rate (Admit) 87 bpm   Heart Rate (Exercise) 103 bpm   Heart Rate (Exit) 95 bpm   Rating of Perceived Exertion (Exercise) 12   Duration Progress to 30 minutes of  aerobic without signs/symptoms of physical distress   Intensity THRR unchanged     Progression   Progression Continue to progress workloads to maintain intensity without signs/symptoms of physical distress.     Resistance Training   Training Prescription Yes   Weight 1   Reps 10-15     NuStep   Level 2   SPM 20   Minutes 15   METs 3.72     Arm Ergometer   Level 2.5   Watts 18   Minutes 20   METs 2.5     Home Exercise Plan   Plans to continue exercise at Home (comment)   Frequency Add 2 additional days to program exercise sessions.     Exercise Review   Progression Yes      Nutrition:  Target Goals: Understanding of nutrition guidelines, daily intake of sodium '1500mg'$ , cholesterol '200mg'$ , calories 30% from fat and 7% or less from saturated fats, daily to have 5 or more servings of fruits and vegetables.  Biometrics:     Pre Biometrics - 10/14/16 1501      Pre Biometrics   Height '5\' 5"'$  (1.651 m)   Weight 244 lb 4.3 oz (110.8 kg)   Waist Circumference 47.5 inches   Hip Circumference 50.5 inches   Waist to Hip Ratio 0.94 %   BMI (Calculated) 40.7   Triceps Skinfold 26 mm   % Body Fat 49.7 %   Grip  Strength 51.33 kg   Flexibility 0 in   Single Leg Stand 7 seconds       Nutrition Therapy Plan and Nutrition Goals:   Nutrition Discharge: Rate Your Plate Scores:     Nutrition Assessments - 10/14/16 1522      MEDFICTS Scores   Pre Score 50      Nutrition Goals Re-Evaluation:   Nutrition Goals Discharge (Final Nutrition Goals Re-Evaluation):   Psychosocial: Target Goals: Acknowledge presence or absence of significant depression and/or stress, maximize coping  skills, provide positive support system. Participant is able to verbalize types and ability to use techniques and skills needed for reducing stress and depression.  Initial Review & Psychosocial Screening:     Initial Psych Review & Screening - 10/14/16 1530      Initial Review   Current issues with Current Sleep Concerns     Family Dynamics   Good Support System? Yes     Barriers   Psychosocial barriers to participate in program There are no identifiable barriers or psychosocial needs.     Screening Interventions   Interventions Encouraged to exercise      Quality of Life Scores:     Quality of Life - 10/14/16 1502      Quality of Life Scores   Health/Function Pre 17.1 %   Socioeconomic Pre 24.13 %   Psych/Spiritual Pre 24 %   Family Pre 23.7 %   GLOBAL Pre 21.03 %      PHQ-9: Recent Review Flowsheet Data    Depression screen [facility] 2/9 10/14/2016 07/19/2016 01/30/2015 01/01/2015   Decreased Interest 0 0 0 0   Down, Depressed, Hopeless 0 0 0 0    PHQ - 2 Score 0 0 0 0   Altered sleeping 1 - - -   Tired, decreased energy 2 - - -   Change in appetite 2 - - -   Feeling bad or failure about yourself  0 - - -   Trouble concentrating 2 - - -   Moving slowly or fidgety/restless 0 - - -   Suicidal thoughts 0 - - -   PHQ-9 Score 7 - - -   Difficult doing work/chores Somewhat difficult - - -     Interpretation of Total Score  Total Score Depression Severity:  1-4 = Minimal depression, 5-9 = Mild  depression, 10-14 = Moderate depression, 15-19 = Moderately severe depression, 20-27 = Severe depression   Psychosocial Evaluation and Intervention:     Psychosocial Evaluation - 10/14/16 1530      Psychosocial Evaluation & Interventions   Interventions Encouraged to exercise with the program and follow exercise prescription   Continue Psychosocial Services  No      Psychosocial Re-Evaluation:     Psychosocial Re-Evaluation    Row Name 10/28/16 0932 11/23/16 1029 12/23/16 0823 01/12/17 1515       Psychosocial Re-Evaluation   Current issues with  - None Identified None Identified None Identified    Comments Patient's QOL score was 21.03 and her PHQ-9 score was 7. She does not feel she is depressed and does not feel she need counseling.  Patient's QOL was 21.03 scoring lower in health function and her PHQ-9 was 7. She says she is not depressed.   -  -    Expected Outcomes  -  - Patient's QOL and PHQ-9 scores will improve at discharge with no psychosocial issues identified.  Patient will have no psychosocial issues identified at discharge.     Interventions Encouraged to attend Cardiac Rehabilitation for the exercise Encouraged to attend Cardiac Rehabilitation for the exercise Encouraged to attend Cardiac Rehabilitation for the exercise Encouraged to attend Cardiac Rehabilitation for the exercise    Continue Psychosocial Services  No No Follow up required No Follow up required No Follow up required       Psychosocial Discharge (Final Psychosocial Re-Evaluation):     Psychosocial Re-Evaluation - 01/12/17 1515      Psychosocial Re-Evaluation   Current issues with None Identified  Expected Outcomes Patient will have no psychosocial issues identified at discharge.    Interventions Encouraged to attend Cardiac Rehabilitation for the exercise   Continue Psychosocial Services  No Follow up required      Vocational Rehabilitation: Provide vocational rehab assistance to qualifying  candidates.   Vocational Rehab Evaluation & Intervention:     Vocational Rehab - 10/14/16 1502      Initial Vocational Rehab Evaluation & Intervention   Assessment shows need for Vocational Rehabilitation No      Education: Education Goals: Education classes will be provided on a weekly basis, covering required topics. Participant will state understanding/return demonstration of topics presented.  Learning Barriers/Preferences:     Learning Barriers/Preferences - 10/14/16 1501      Learning Barriers/Preferences   Learning Preferences Pictoral;Video;Written Material      Education Topics: Hypertension, Hypertension Reduction -Define heart disease and high blood pressure. Discus how high blood pressure affects the body and ways to reduce high blood pressure.   CARDIAC REHAB PHASE II EXERCISE from 01/12/2017 in [place]  Date  10/20/16  Educator  DC  Instruction Review Code  2- meets goals/outcomes      Exercise and Your Heart -Discuss why it is important to exercise, the FITT principles of exercise, normal and abnormal responses to exercise, and how to exercise safely.   CARDIAC REHAB PHASE II EXERCISE from 01/12/2017 in [place]  Date  10/27/16  Educator  DC  Instruction Review Code  2- meets goals/outcomes      Angina -Discuss definition of angina, causes of angina, treatment of angina, and how to decrease risk of having angina.   CARDIAC REHAB PHASE II EXERCISE from 01/12/2017 in [place]  Date  11/03/16  Educator  DJ  Instruction Review Code  2- meets goals/outcomes      Cardiac Medications -Review what the following cardiac medications are used for, how they affect the body, and side effects that may occur when taking the medications.  Medications include Aspirin, Beta blockers, calcium channel blockers, ACE Inhibitors, angiotensin receptor blockers, diuretics, digoxin, and  antihyperlipidemics.   Congestive Heart Failure -Discuss the definition of CHF, how to live with CHF, the signs and symptoms of CHF, and how keep track of weight and sodium intake.   CARDIAC REHAB PHASE II EXERCISE from 01/12/2017 in [place]  Date  11/17/16  Educator  DC  Instruction Review Code  2- meets goals/outcomes      Heart Disease and Intimacy -Discus the effect sexual activity has on the heart, how changes occur during intimacy as we age, and safety during sexual activity.   CARDIAC REHAB PHASE II EXERCISE from 01/12/2017 in [place]  Date  11/24/16  Educator  Dc  Instruction Review Code  2- meets goals/outcomes      Smoking Cessation / COPD -Discuss different methods to quit smoking, the health benefits of quitting smoking, and the definition of COPD.   Nutrition I: Fats -Discuss the types of cholesterol, what cholesterol does to the heart, and how cholesterol levels can be controlled.   Nutrition II: Labels -Discuss the different components of food labels and how to read food label   CARDIAC REHAB PHASE II EXERCISE from 01/12/2017 in [place]  Date  12/15/16  Educator  DC  Instruction Review Code  2- meets goals/outcomes      Heart Parts and Heart Disease -Discuss the anatomy of the heart, the  pathway of blood circulation through the heart, and these are affected by heart disease.   Stress I: Signs and Symptoms -Discuss the causes of stress, how stress may lead to anxiety and depression, and ways to limit stress.   Stress II: Relaxation -Discuss different types of relaxation techniques to limit stress.   Warning Signs of Stroke / TIA -Discuss definition of a stroke, what the signs and symptoms are of a stroke, and how to identify when someone is having stroke.   CARDIAC REHAB PHASE II EXERCISE from 01/12/2017 in [place]  Date  01/12/17  Educator  DC   Instruction Review Code  2- meets goals/outcomes      Knowledge Questionnaire Score:     Knowledge Questionnaire Score - 10/14/16 1502      Knowledge Questionnaire Score   Pre Score 16/24      Core Components/Risk Factors/Patient Goals at Admission:     Personal Goals and Risk Factors at Admission - 10/14/16 1522      Core Components/Risk Factors/Patient Goals on Admission    Weight Management Yes   Admit Weight 244 lb 8 oz (110.9 kg)   Goal Weight: Short Term 234 lb 8 oz (106.4 kg)   Goal Weight: Long Term 224 lb 8 oz (101.8 kg)   Expected Outcomes Short Term: Continue to assess and modify interventions until short term weight is achieved;Long Term: Adherence to nutrition and physical activity/exercise program aimed toward attainment of established weight goal   Sedentary Yes   Intervention Provide advice, education, support and counseling about physical activity/exercise needs.;Develop an individualized exercise prescription for aerobic and resistive training based on initial evaluation findings, risk stratification, comorbidities and participant's personal goals.   Expected Outcomes Achievement of increased cardiorespiratory fitness and enhanced flexibility, muscular endurance and strength shown through measurements of functional capacity and personal statement of participant.   Increase Strength and Stamina Yes   Intervention Provide advice, education, support and counseling about physical activity/exercise needs.;Develop an individualized exercise prescription for aerobic and resistive training based on initial evaluation findings, risk stratification, comorbidities and participant's personal goals.   Expected Outcomes Achievement of increased cardiorespiratory fitness and enhanced flexibility, muscular endurance and strength shown through measurements of functional capacity and personal statement of participant.   Diabetes Yes   Intervention Provide education about  signs/symptoms and action to take for hypo/hyperglycemia.;Provide education about proper nutrition, including hydration, and aerobic/resistive exercise prescription along with prescribed medications to achieve blood glucose in normal ranges: Fasting glucose 65-99 mg/dL   Expected Outcomes Short Term: Participant verbalizes understanding of the signs/symptoms and immediate care of hyper/hypoglycemia, proper foot care and importance of medication, aerobic/resistive exercise and nutrition plan for blood glucose control.;Long Term: Attainment of HbA1C < 7%.   Heart Failure Yes   Intervention Provide a combined exercise and nutrition program that is supplemented with education, support and counseling about heart failure. Directed toward relieving symptoms such as shortness of breath, decreased exercise tolerance, and extremity edema.   Expected Outcomes Long term: Adoption of self-care skills and reduction of barriers for early signs and symptoms recognition and intervention leading to self-care maintenance.;Short term: Attendance in program 2-3 days a week with increased exercise capacity. Reported lower sodium intake. Reported increased fruit and vegetable intake. Reports medication compliance.   Personal Goal Other Yes   Personal Goal Get around more, Get stronger, lose weight   Intervention Attend CR 3xweek and supplement with 2xweek at home   Expected Outcomes Reach personal goals.  Core Components/Risk Factors/Patient Goals Review:      Goals and Risk Factor Review    Row Name 10/14/16 1529 10/28/16 0928 11/23/16 1023 12/23/16 0818 01/12/17 1509     Core Components/Risk Factors/Patient Goals Review   Personal Goals Review Weight Management/Obesity;Sedentary;Increase Strength and Stamina;Heart Failure;Diabetes Weight Management/Obesity;Sedentary;Increase Strength and Stamina;Diabetes  Get around more; Get stronger; lose 50 lbs.  Weight Management/Obesity;Diabetes;Heart Failure  Get around  more; get stronger; lose 50 lbs.  Weight Management/Obesity;Diabetes  Lose 50 lbs.  Weight Management/Obesity;Diabetes  Lose 50 lbs.    Review  - Patient has attended 5 sessions. Will continue to monitor for progress.  Patient has completed 13 sessions. She has maintained her weight. She has progressed well in the program. She says she is feeling stronger and feels like doing more at home.  Patient has completed 20 sessions gaining 3.3 lbs. Her reported fasting glucose readings are usually WNL.  Patient has completed 24 sessions gaining 2.2 lbs. Patient has been inconsistent in her attendance impeding her progress. Her DM is controlled.    Expected Outcomes  - Patient will continue to attend sessions meeting her personal goals.  Patient will complete the program meeting her personal goals.  Patient will complete the program and start meeting her weight loss goal.  Patient will be more consistent in her attendance and complete the program meeting her personal goals.      Core Components/Risk Factors/Patient Goals at Discharge (Final Review):      Goals and Risk Factor Review - 01/12/17 1509      Core Components/Risk Factors/Patient Goals Review   Personal Goals Review Weight Management/Obesity;Diabetes  Lose 50 lbs.    Review Patient has completed 24 sessions gaining 2.2 lbs. Patient has been inconsistent in her attendance impeding her progress. Her DM is controlled.    Expected Outcomes Patient will be more consistent in her attendance and complete the program meeting her personal goals.      ITP Comments:     ITP Comments    Row Name 11/22/16 1503           ITP Comments Patient met with Registered Dietitian to discuss nutrition topics including: Heart healthty eating, heart health cooking and make smart choices when shopping; Portion control; weight management; and hydration. Patient attended a group session with the hospital chaplian called [facility] to discuss and share how her  recent cardiac diagnosis has effected her life.          Comments: ITP 30 Day REVIEW Patient is progressing in the program. Her attendance has been inconsistent d/t illness. Will continue to monitor for progress.

## 2017-01-14 ENCOUNTER — Encounter (HOSPITAL_COMMUNITY)
Admission: RE | Admit: 2017-01-14 | Discharge: 2017-01-14 | Disposition: A | Discharge status: Home / Self Care | Payer: Medicare HMO | Source: Ambulatory Visit | Attending: Cardiology | Admitting: Cardiology

## 2017-01-14 DIAGNOSIS — Z8711 Personal history of peptic ulcer disease: Secondary | ICD-10-CM | POA: Diagnosis not present

## 2017-01-14 DIAGNOSIS — M1990 Unspecified osteoarthritis, unspecified site: Secondary | ICD-10-CM | POA: Diagnosis not present

## 2017-01-14 DIAGNOSIS — I110 Hypertensive heart disease with heart failure: Secondary | ICD-10-CM | POA: Diagnosis not present

## 2017-01-14 DIAGNOSIS — Z794 Long term (current) use of insulin: Secondary | ICD-10-CM | POA: Diagnosis not present

## 2017-01-14 DIAGNOSIS — I255 Ischemic cardiomyopathy: Secondary | ICD-10-CM | POA: Diagnosis not present

## 2017-01-14 DIAGNOSIS — I5022 Chronic systolic (congestive) heart failure: Secondary | ICD-10-CM | POA: Diagnosis not present

## 2017-01-14 DIAGNOSIS — I2510 Atherosclerotic heart disease of native coronary artery without angina pectoris: Secondary | ICD-10-CM | POA: Diagnosis not present

## 2017-01-14 DIAGNOSIS — E119 Type 2 diabetes mellitus without complications: Secondary | ICD-10-CM | POA: Diagnosis not present

## 2017-01-14 DIAGNOSIS — Z79899 Other long term (current) drug therapy: Secondary | ICD-10-CM | POA: Diagnosis not present

## 2017-01-14 NOTE — Progress Notes (Signed)
Daily Session Note  Patient Details  Name: [name] MRN: [id number] Date of Birth: 1950-10-13 Referring Provider:     CARDIAC REHAB PHASE II ORIENTATION from 10/14/2016 in [facility]  Referring Provider  Dr. Harl Bowie      Encounter Date: 01/14/2017  Check In:     Session Check In - 01/14/17 1114      Check-In   Location [facility]   Staff Present Diane Angelina Pih, MS, EP, [facility], Exercise Physiologist;Malaky Tetrault Luther Parody, BS, EP, Exercise Physiologist   Supervising physician immediately available to respond to emergencies See telemetry face sheet for immediately available MD   Medication changes reported     No   Fall or balance concerns reported    No   Warm-up and Cool-down Performed as group-led instruction   Resistance Training Performed Yes   VAD Patient? No     Pain Assessment   Currently in Pain? No/denies   Pain Score 0-No pain   Multiple Pain Sites No      Capillary Blood Glucose: No results found for this or any previous visit (from the past 24 hour(s)).    History  Smoking Status  . Former Smoker  . Packs/day: 0.25  . Years: 10.00  . Types: Cigarettes  . Start date: 09/20/1966  . Quit date: 09/20/1978  Smokeless Tobacco  . Never Used    Comment: Quit 2008    Goals Met:  Independence with exercise equipment Exercise tolerated well No report of cardiac concerns or symptoms Strength training completed today  Goals Unmet:  Not Applicable  Comments: Check out 1200   Dr. Kate Sable is Medical Director for [place] and Pulmonary Rehab.

## 2017-01-17 ENCOUNTER — Encounter (HOSPITAL_COMMUNITY)
Admission: RE | Admit: 2017-01-17 | Discharge: 2017-01-17 | Disposition: A | Discharge status: Home / Self Care | Payer: Medicare HMO | Source: Ambulatory Visit | Attending: Cardiology | Admitting: Cardiology

## 2017-01-17 DIAGNOSIS — E119 Type 2 diabetes mellitus without complications: Secondary | ICD-10-CM | POA: Diagnosis not present

## 2017-01-17 DIAGNOSIS — I110 Hypertensive heart disease with heart failure: Secondary | ICD-10-CM | POA: Diagnosis not present

## 2017-01-17 DIAGNOSIS — I5022 Chronic systolic (congestive) heart failure: Secondary | ICD-10-CM | POA: Diagnosis not present

## 2017-01-17 DIAGNOSIS — M1990 Unspecified osteoarthritis, unspecified site: Secondary | ICD-10-CM | POA: Diagnosis not present

## 2017-01-17 DIAGNOSIS — I2510 Atherosclerotic heart disease of native coronary artery without angina pectoris: Secondary | ICD-10-CM | POA: Diagnosis not present

## 2017-01-17 DIAGNOSIS — Z8711 Personal history of peptic ulcer disease: Secondary | ICD-10-CM | POA: Diagnosis not present

## 2017-01-17 DIAGNOSIS — I255 Ischemic cardiomyopathy: Secondary | ICD-10-CM | POA: Diagnosis not present

## 2017-01-17 DIAGNOSIS — Z794 Long term (current) use of insulin: Secondary | ICD-10-CM | POA: Diagnosis not present

## 2017-01-17 DIAGNOSIS — Z79899 Other long term (current) drug therapy: Secondary | ICD-10-CM | POA: Diagnosis not present

## 2017-01-19 ENCOUNTER — Encounter (HOSPITAL_COMMUNITY): Payer: Medicare HMO

## 2017-01-20 ENCOUNTER — Ambulatory Visit (INDEPENDENT_AMBULATORY_CARE_PROVIDER_SITE_OTHER): Payer: Medicare HMO | Admitting: Cardiology

## 2017-01-20 ENCOUNTER — Encounter: Payer: Self-pay | Admitting: Cardiology

## 2017-01-20 ENCOUNTER — Encounter: Payer: Self-pay | Admitting: *Deleted

## 2017-01-20 VITALS — BP 109/70 | HR 68 | Ht 65.0 in | Wt 244.6 lb

## 2017-01-20 DIAGNOSIS — I2510 Atherosclerotic heart disease of native coronary artery without angina pectoris: Secondary | ICD-10-CM

## 2017-01-20 DIAGNOSIS — I5022 Chronic systolic (congestive) heart failure: Secondary | ICD-10-CM | POA: Diagnosis not present

## 2017-01-20 DIAGNOSIS — E782 Mixed hyperlipidemia: Secondary | ICD-10-CM

## 2017-01-20 NOTE — Patient Instructions (Signed)
Your physician wants you to follow-up in: 4 MONTHS WITH DR BRANCH You will receive a reminder letter in the mail two months in advance. If you don't receive a letter, please call our office to schedule the follow-up appointment.  Your physician recommends that you continue on your current medications as directed. Please refer to the Current Medication list given to you today.  Thank you for choosing [facility]!!   

## 2017-01-20 NOTE — Progress Notes (Signed)
Clinical Summary [name] is a 66 y.o.female seen today for follow up of the following meidcal problems.   1. Chronic combined systolicand diastolic heart failure - echo 03/2014 LVEF 25-30%, restrictive diastolic dysfunction. New diagnosis at that time. Repeat echo 06/2014 LVEF 40-45% - cath 03/2014 showed RCA 99% with left to right collaterals, other arteries patent. Overall LV systolic dysfunction out of proportion to CAD. Medically managed.  - 07/2015 echo LVEF 24%, grade I diastolic dysfunction - Entresto causes dizziness and nausea, stopped taking. Back on losartan   - participating in cardiac rehab - still with some SOB at times, can vary in severity.  - no recent edema.  - compliant with meds. Limiting sodium intake. Avoiding NSAIDs.   2. CAD - cath 03/2014 with 99% chronic RCA disease, otherwise patent vessels. Managed medically -she denies any recent chest pain  3. OSA  - severe OSA by sleep study 04/2016 - she is awaiting her CPAP   4. Hyperlipidemia - compliant with statin 02/2016: TC 84 TG 79 HDL 40 LDL 28  Past Medical History:  Diagnosis Date  . Anemia   . Arthritis   . CAD (coronary artery disease)    a. LHC (03/2014) Lmain: nl, LAD: diff dz proximal 20%, 30-40% dz mid vessel prior 2nd diagonal, LCx: 40% in OM1, 20-30% in OM2, RCA: 99% subtotal occlusion @ crux, TIMI 1 flow, L-R collaterals to distal vessel  . Chronic systolic heart failure ([name])    a. ECHO (03/2014): EF 25-30%, akinesis enteroanteroseptal myocardium, grade III DD b. RHC (03/2014) RA 4, RV 42/4, PA 44/14 (26), PCWP 21, PA 62% Fick CO/CI 6.9 / 3.4  . Diabetes mellitus   . Duodenal ulcer    Age 77  . Erosive esophagitis   . Gastritis   . Hypertension   . Ischemic cardiomyopathy   . Obese   . Short-term memory loss   . Thyroid disease   . TTP (thrombotic thrombocytopenic purpura) (HCC)      Allergies  Allergen Reactions  . Other Other (See Comments)    FLAGYL, CIPRO, PROTONIX  taken at the same time caused Best to lose her breath and have trouble breathing, requiring [facility] stay at [name]   . Lisinopril Cough     Current Outpatient Prescriptions  Medication Sig Dispense Refill  . acetaminophen (TYLENOL) 500 MG tablet Take 1,000 mg by mouth every 6 (six) hours as needed for moderate pain.     Marland Kitchen aspirin 81 MG chewable tablet Chew 1 tablet (81 mg total) by mouth daily.    Marland Kitchen atorvastatin (LIPITOR) 80 MG tablet Take 1 tablet (80 mg total) by mouth daily at 6 PM. 90 tablet 3  . carvedilol (COREG) 25 MG tablet Take 1 tablet (25 mg total) by mouth 2 (two) times daily. 180 tablet 3  . furosemide (LASIX) 40 MG tablet Take 1 tablet (40 mg total) by mouth daily. 30 tablet 6  . insulin glargine (LANTUS) 100 UNIT/ML injection Inject 40 Units into the skin 2 (two) times daily.    Marland Kitchen losartan (COZAAR) 50 MG tablet Take 50 mg by mouth daily.    . Multiple Vitamin (MULTIVITAMIN) tablet Take 1 tablet by mouth daily.    Marland Kitchen NOVOLOG 100 UNIT/ML injection Inject 10 Units into the skin 3 (three) times daily with meals.     . polyethylene glycol (MIRALAX / GLYCOLAX) packet Take 17 g by mouth daily.    . ranitidine (ZANTAC) 150 MG tablet Take  150 mg by mouth 2 (two) times daily.    Marland Kitchen spironolactone (ALDACTONE) 25 MG tablet Take 1 tablet (25 mg total) by mouth daily. 90 tablet 3   No current facility-administered medications for this visit.      Past Surgical History:  Procedure Laterality Date  . COLONOSCOPY  08/29/2012   [id number] diverticulosis  . COLONOSCOPY N/A 05/28/2015   Procedure: COLONOSCOPY;  Surgeon: Daneil Dolin, MD;  Location: [name];  Service: Endoscopy;  Laterality: N/A;  1015  . ECTOPIC PREGNANCY SURGERY    . ESOPHAGOGASTRODUODENOSCOPY  04/20/2012   GHW:[id number] antral gastritis/Erosive reflux esophagitis  . LEFT AND RIGHT HEART CATHETERIZATION WITH CORONARY ANGIOGRAM N/A 03/28/2014   Procedure: LEFT AND RIGHT HEART CATHETERIZATION WITH  CORONARY ANGIOGRAM;  Surgeon: Peter M Martinique, MD;  Location: [name];  Service: Cardiovascular;  Laterality: N/A;  . SPLENECTOMY, PARTIAL       Allergies  Allergen Reactions  . Other Other (See Comments)    FLAGYL, CIPRO, PROTONIX taken at the same time caused Best to lose her breath and have trouble breathing, requiring [facility] stay at [name], 03/23/14-per Best.   . Lisinopril Cough      Family History  Problem Relation Age of Onset  . Heart failure Mother   . Cirrhosis Father     ETOH  . Diabetes Daughter   . Colon cancer Neg Hx      Social History [name] reports that she quit smoking about 38 years ago. Her smoking use included Cigarettes. She started smoking about 50 years ago. She has a 2.50 pack-year smoking history. She has never used smokeless tobacco. [name] reports that she does not drink alcohol.   Review of Systems CONSTITUTIONAL: No weight loss, fever, chills, weakness or fatigue.  HEENT: Eyes: No visual loss, blurred vision, double vision or yellow sclerae.No hearing loss, sneezing, congestion, runny nose or sore throat.  SKIN: No rash or itching.  CARDIOVASCULAR: per hpi RESPIRATORY: No shortness of breath, cough or sputum.  GASTROINTESTINAL: No anorexia, nausea, vomiting or diarrhea. No abdominal pain or blood.  GENITOURINARY: No burning on urination, no polyuria NEUROLOGICAL: No headache, dizziness, syncope, paralysis, ataxia, numbness or tingling in the extremities. No change in bowel or bladder control.  MUSCULOSKELETAL: No muscle, back pain, joint pain or stiffness.  LYMPHATICS: No enlarged nodes. No history of splenectomy.  PSYCHIATRIC: No history of depression or anxiety.  ENDOCRINOLOGIC: No reports of sweating, cold or heat intolerance. No polyuria or polydipsia.  Marland Kitchen   Physical Examination Vitals:   01/20/17 1007  BP: 109/70  Pulse: 68   Vitals:   01/20/17 1007  Weight: 244 lb 9.6 oz (110.9 kg)  Height: 5\' 5"  (1.651 m)     Gen: resting comfortably, no acute distress HEENT: no scleral icterus, pupils equal round and reactive, no palptable cervical adenopathy,  CV: RRR, no m/r/g, no jvd Resp: Clear to auscultation bilaterally GI: abdomen is soft, non-tender, non-distended, normal bowel sounds, no hepatosplenomegaly MSK: extremities are warm, no edema.  Skin: warm, no rash Neuro:  no focal deficits Psych: appropriate affect   Diagnostic Studies  03/2014 echo Study Conclusions  - Left ventricle: The cavity size was normal. There was mild focal basal hypertrophy of the septum. Systolic function was severely reduced. The estimated ejection fraction was in the range of 25% to 30%. There is akinesis of the entireanteroseptal myocardium. Doppler parameters are consistent with a reversible restrictive pattern, indicative of decreased left ventricular diastolic compliance and/or increased left  atrial pressure (grade 3 diastolic dysfunction). - Mitral valve: There was mild regurgitation. - Left atrium: The atrium was moderately dilated. 03/2014 Cath Procedural Findings:  Hemodynamics  RA 8/0 mean 4 mm Hg  RV 42/4 mm Hg  PA 44/14 mean 26 mm Hg  PCWP 20/28 mean 21 mm Hg  LV 98/18 mm Hg  AO 95/54 mean 72 mm Hg  Oxygen saturations:  PA 62%  AO 90%  Cardiac Output (Fick) 6.9 L/min  Cardiac Index (Fick) 3.4 L/min/meter squared  Coronary angiography:  Coronary dominance: right  Left mainstem: Normal  Left anterior descending (LAD): Diffuse disease in the proximal vessel up to 20%. 30-40% disease in the mid vessel prior to the second diagonal.  Left circumflex (LCx): 40% in OM1. 20-30% in OM2. Otherwise no significant disease.  Right coronary artery (RCA): 99% subtotal occlusion at the crux. TIMI 1 flow. Left to right collaterals to the distal vessel.  Left ventriculography: Left ventricular systolic function is abnormal, LVEF is estimated at 25%. There is inferior akinesis and severe  global hypokinesis. There is mild mitral regurgitation  Final Conclusions:  1. Single vessel obstructive CAD with subtotal occlusion of the RCA at the crux.  2. Severe LV dysfunction.  3. Normal Right heart pressures. Elevated PCWP.  Recommendations: Medical management. Her degree of LV dysfunction is out of proportion to her CAD. She has no anginal symptoms so PCI of RCA not indicated. The inferior wall appears scarred.      Assessment and Plan   1. Chronic combined systolic/diastolic HF - NICM, LVEF 80-16% now up to 40-45%, NYHA II. - continue curren tmeds  2. CAD - medically managed single vessel RCA disease - no symptoms.  - we will continue current meds  3. OSA  -awaiting CPAP machine.   4. Hyperlipidemia -continue statin       Arnoldo Lenis, M.D.

## 2017-01-21 ENCOUNTER — Encounter (HOSPITAL_COMMUNITY)
Admission: RE | Admit: 2017-01-21 | Discharge: 2017-01-21 | Disposition: A | Discharge status: Home / Self Care | Payer: Medicare HMO | Source: Ambulatory Visit | Attending: Cardiology | Admitting: Cardiology

## 2017-01-21 DIAGNOSIS — Z7982 Long term (current) use of aspirin: Secondary | ICD-10-CM | POA: Diagnosis not present

## 2017-01-21 DIAGNOSIS — Z794 Long term (current) use of insulin: Secondary | ICD-10-CM | POA: Insufficient documentation

## 2017-01-21 DIAGNOSIS — E669 Obesity, unspecified: Secondary | ICD-10-CM | POA: Diagnosis not present

## 2017-01-21 DIAGNOSIS — Z6841 Body Mass Index (BMI) 40.0 and over, adult: Secondary | ICD-10-CM | POA: Insufficient documentation

## 2017-01-21 DIAGNOSIS — Z8711 Personal history of peptic ulcer disease: Secondary | ICD-10-CM | POA: Insufficient documentation

## 2017-01-21 DIAGNOSIS — I255 Ischemic cardiomyopathy: Secondary | ICD-10-CM | POA: Diagnosis not present

## 2017-01-21 DIAGNOSIS — E079 Disorder of thyroid, unspecified: Secondary | ICD-10-CM | POA: Diagnosis not present

## 2017-01-21 DIAGNOSIS — I2510 Atherosclerotic heart disease of native coronary artery without angina pectoris: Secondary | ICD-10-CM | POA: Insufficient documentation

## 2017-01-21 DIAGNOSIS — M311 Thrombotic microangiopathy: Secondary | ICD-10-CM | POA: Diagnosis not present

## 2017-01-21 DIAGNOSIS — R413 Other amnesia: Secondary | ICD-10-CM | POA: Insufficient documentation

## 2017-01-21 DIAGNOSIS — Z79899 Other long term (current) drug therapy: Secondary | ICD-10-CM | POA: Insufficient documentation

## 2017-01-21 DIAGNOSIS — I5022 Chronic systolic (congestive) heart failure: Secondary | ICD-10-CM | POA: Insufficient documentation

## 2017-01-21 DIAGNOSIS — M1990 Unspecified osteoarthritis, unspecified site: Secondary | ICD-10-CM | POA: Insufficient documentation

## 2017-01-21 DIAGNOSIS — I110 Hypertensive heart disease with heart failure: Secondary | ICD-10-CM | POA: Insufficient documentation

## 2017-01-21 DIAGNOSIS — E119 Type 2 diabetes mellitus without complications: Secondary | ICD-10-CM | POA: Diagnosis not present

## 2017-01-21 DIAGNOSIS — Z87891 Personal history of nicotine dependence: Secondary | ICD-10-CM | POA: Diagnosis not present

## 2017-01-21 NOTE — Progress Notes (Signed)
Daily Session Note  Patient Details  Name: MARIYAH UPSHAW MRN: [id number] Date of Birth: 1950-11-27 Referring Provider:     CARDIAC REHAB PHASE II ORIENTATION from 10/14/2016 in [place]  Referring Provider  Dr. Harl Bowie      Encounter Date: 01/21/2017  Check In:     Session Check In - 01/21/17 1100      Check-In   Location [facility]   Staff Present Ambri Miltner Angelina Pih, MS, EP, [facility], Exercise Physiologist;Debra Wynetta Emery, RN, BSN   Supervising physician immediately available to respond to emergencies See telemetry face sheet for immediately available MD   Medication changes reported     No   Fall or balance concerns reported    No   Tobacco Cessation No Change   Warm-up and Cool-down Performed as group-led instruction   Resistance Training Performed Yes   VAD Patient? No     Pain Assessment   Currently in Pain? No/denies   Pain Score 0-No pain   Multiple Pain Sites No      Capillary Blood Glucose: No results found for this or any previous visit (from the past 24 hour(s)).    History  Smoking Status  . Former Smoker  . Packs/day: 0.25  . Years: 10.00  . Types: Cigarettes  . Start date: 09/20/1966  . Quit date: 09/20/1978  Smokeless Tobacco  . Never Used    Comment: Quit 2008    Goals Met:  Independence with exercise equipment Exercise tolerated well No report of cardiac concerns or symptoms Strength training completed today  Goals Unmet:  Not Applicable  Comments: check out: 1200    Dr. Kate Sable is Medical Director for [facility] and Pulmonary Rehab.

## 2017-01-24 ENCOUNTER — Encounter (HOSPITAL_COMMUNITY)
Admission: RE | Admit: 2017-01-24 | Discharge: 2017-01-24 | Disposition: A | Discharge status: Home / Self Care | Payer: Medicare HMO | Source: Ambulatory Visit | Attending: Cardiology | Admitting: Cardiology

## 2017-01-24 DIAGNOSIS — I110 Hypertensive heart disease with heart failure: Secondary | ICD-10-CM | POA: Diagnosis not present

## 2017-01-24 DIAGNOSIS — M1990 Unspecified osteoarthritis, unspecified site: Secondary | ICD-10-CM | POA: Diagnosis not present

## 2017-01-24 DIAGNOSIS — Z794 Long term (current) use of insulin: Secondary | ICD-10-CM | POA: Diagnosis not present

## 2017-01-24 DIAGNOSIS — Z8711 Personal history of peptic ulcer disease: Secondary | ICD-10-CM | POA: Diagnosis not present

## 2017-01-24 DIAGNOSIS — I2510 Atherosclerotic heart disease of native coronary artery without angina pectoris: Secondary | ICD-10-CM | POA: Diagnosis not present

## 2017-01-24 DIAGNOSIS — Z79899 Other long term (current) drug therapy: Secondary | ICD-10-CM | POA: Diagnosis not present

## 2017-01-24 DIAGNOSIS — E119 Type 2 diabetes mellitus without complications: Secondary | ICD-10-CM | POA: Diagnosis not present

## 2017-01-24 DIAGNOSIS — I5022 Chronic systolic (congestive) heart failure: Secondary | ICD-10-CM | POA: Diagnosis not present

## 2017-01-24 DIAGNOSIS — I255 Ischemic cardiomyopathy: Secondary | ICD-10-CM | POA: Diagnosis not present

## 2017-01-24 NOTE — Progress Notes (Signed)
Daily Session Note  Patient Details  Name: [name] MRN: [id number] Date of Birth: 1950-12-25 Referring Provider:     CARDIAC REHAB PHASE II ORIENTATION from 10/14/2016 in [place]  Referring Provider  Dr. Harl Bowie      Encounter Date: 01/24/2017  Check In:     Session Check In - 01/24/17 1100      Check-In   Location [facility]   Staff Present Diane Angelina Pih, MS, EP, [facility], Exercise Physiologist;Kaisey Huseby Wynetta Emery, RN, BSN   Supervising physician immediately available to respond to emergencies See telemetry face sheet for immediately available MD   Medication changes reported     No   Fall or balance concerns reported    No   Warm-up and Cool-down Performed as group-led instruction   Resistance Training Performed Yes   VAD Patient? No     Pain Assessment   Currently in Pain? No/denies   Pain Score 0-No pain   Multiple Pain Sites No      Capillary Blood Glucose: No results found for this or any previous visit (from the past 24 hour(s)).    History  Smoking Status  . Former Smoker  . Packs/day: 0.25  . Years: 10.00  . Types: Cigarettes  . Start date: 09/20/1966  . Quit date: 09/20/1978  Smokeless Tobacco  . Never Used    Comment: Quit 2008    Goals Met:  Independence with exercise equipment Exercise tolerated well No report of cardiac concerns or symptoms Strength training completed today  Goals Unmet:  Not Applicable  Comments: Check out 1200.   Dr. Kate Sable is Medical Director for [facility] Cardiac and Pulmonary Rehab.

## 2017-01-26 ENCOUNTER — Encounter (HOSPITAL_COMMUNITY)
Admission: RE | Admit: 2017-01-26 | Discharge: 2017-01-26 | Disposition: A | Discharge status: Home / Self Care | Payer: Medicare HMO | Source: Ambulatory Visit | Attending: Cardiology | Admitting: Cardiology

## 2017-01-26 DIAGNOSIS — Z794 Long term (current) use of insulin: Secondary | ICD-10-CM | POA: Diagnosis not present

## 2017-01-26 DIAGNOSIS — M1990 Unspecified osteoarthritis, unspecified site: Secondary | ICD-10-CM | POA: Diagnosis not present

## 2017-01-26 DIAGNOSIS — I5022 Chronic systolic (congestive) heart failure: Secondary | ICD-10-CM | POA: Diagnosis not present

## 2017-01-26 DIAGNOSIS — Z8711 Personal history of peptic ulcer disease: Secondary | ICD-10-CM | POA: Diagnosis not present

## 2017-01-26 DIAGNOSIS — I2510 Atherosclerotic heart disease of native coronary artery without angina pectoris: Secondary | ICD-10-CM | POA: Diagnosis not present

## 2017-01-26 DIAGNOSIS — E119 Type 2 diabetes mellitus without complications: Secondary | ICD-10-CM | POA: Diagnosis not present

## 2017-01-26 DIAGNOSIS — I255 Ischemic cardiomyopathy: Secondary | ICD-10-CM | POA: Diagnosis not present

## 2017-01-26 DIAGNOSIS — I110 Hypertensive heart disease with heart failure: Secondary | ICD-10-CM | POA: Diagnosis not present

## 2017-01-26 DIAGNOSIS — Z79899 Other long term (current) drug therapy: Secondary | ICD-10-CM | POA: Diagnosis not present

## 2017-01-26 NOTE — Progress Notes (Signed)
Daily Session Note  Patient Details  Name: [name] MRN: [id number] Date of Birth: 11-30-1950 Referring Provider:     CARDIAC REHAB PHASE II ORIENTATION from 10/14/2016 in [place]  Referring Provider  Dr. Harl Bowie      Encounter Date: 01/26/2017  Check In:     Session Check In - 01/26/17 1116      Check-In   Location [facility]   Staff Present Diane Angelina Pih, MS, EP, [facility], Exercise Physiologist;Debra Wynetta Emery, RN, BSN;Loany Neuroth, BS, EP, Exercise Physiologist   Supervising physician immediately available to respond to emergencies See telemetry face sheet for immediately available MD   Medication changes reported     No   Fall or balance concerns reported    No   Warm-up and Cool-down Performed as group-led instruction   Resistance Training Performed Yes   VAD Patient? No     Pain Assessment   Currently in Pain? No/denies   Pain Score 0-No pain   Multiple Pain Sites No      Capillary Blood Glucose: No results found for this or any previous visit (from the past 24 hour(s)).    History  Smoking Status  . Former Smoker  . Packs/day: 0.25  . Years: 10.00  . Types: Cigarettes  . Start date: 09/20/1966  . Quit date: 09/20/1978  Smokeless Tobacco  . Never Used    Comment: Quit 2008    Goals Met:  Independence with exercise equipment Exercise tolerated well No report of cardiac concerns or symptoms Strength training completed today  Goals Unmet:  Not Applicable  Comments: Check out 1200   Dr. Kate Sable is Medical Director for [place] and Pulmonary Rehab.

## 2017-01-28 ENCOUNTER — Encounter (HOSPITAL_COMMUNITY): Payer: Medicare HMO

## 2017-01-31 ENCOUNTER — Other Ambulatory Visit: Payer: Self-pay | Admitting: Cardiology

## 2017-01-31 ENCOUNTER — Encounter (HOSPITAL_COMMUNITY)
Admission: RE | Admit: 2017-01-31 | Discharge: 2017-01-31 | Disposition: A | Discharge status: Home / Self Care | Payer: Medicare HMO | Source: Ambulatory Visit | Attending: Cardiology | Admitting: Cardiology

## 2017-01-31 DIAGNOSIS — E119 Type 2 diabetes mellitus without complications: Secondary | ICD-10-CM | POA: Diagnosis not present

## 2017-01-31 DIAGNOSIS — I255 Ischemic cardiomyopathy: Secondary | ICD-10-CM | POA: Diagnosis not present

## 2017-01-31 DIAGNOSIS — Z8711 Personal history of peptic ulcer disease: Secondary | ICD-10-CM | POA: Diagnosis not present

## 2017-01-31 DIAGNOSIS — Z794 Long term (current) use of insulin: Secondary | ICD-10-CM | POA: Diagnosis not present

## 2017-01-31 DIAGNOSIS — I5022 Chronic systolic (congestive) heart failure: Secondary | ICD-10-CM

## 2017-01-31 DIAGNOSIS — I110 Hypertensive heart disease with heart failure: Secondary | ICD-10-CM | POA: Diagnosis not present

## 2017-01-31 DIAGNOSIS — I2510 Atherosclerotic heart disease of native coronary artery without angina pectoris: Secondary | ICD-10-CM | POA: Diagnosis not present

## 2017-01-31 DIAGNOSIS — M1990 Unspecified osteoarthritis, unspecified site: Secondary | ICD-10-CM | POA: Diagnosis not present

## 2017-01-31 DIAGNOSIS — Z79899 Other long term (current) drug therapy: Secondary | ICD-10-CM | POA: Diagnosis not present

## 2017-01-31 NOTE — Progress Notes (Signed)
Daily Session Note  Patient Details  Name: ALLAINA BROTZMAN MRN: [id number] Date of Birth: 1950-12-21 Referring Provider:     CARDIAC REHAB PHASE II ORIENTATION from 10/14/2016 in [place]  Referring Provider  Dr. Harl Bowie      Encounter Date: 01/31/2017  Check In:     Session Check In - 01/31/17 1100      Check-In   Location [facility]   Staff Present Suzanne Boron, BS, EP, Exercise Physiologist;Red Mandt Wynetta Emery, RN, BSN   Supervising physician immediately available to respond to emergencies See telemetry face sheet for immediately available MD   Medication changes reported     No   Warm-up and Cool-down Performed as group-led instruction   Resistance Training Performed Yes   VAD Patient? No     Pain Assessment   Currently in Pain? No/denies   Pain Score 0-No pain   Multiple Pain Sites No      Capillary Blood Glucose: No results found for this or any previous visit (from the past 24 hour(s)).    History  Smoking Status  . Former Smoker  . Packs/day: 0.25  . Years: 10.00  . Types: Cigarettes  . Start date: 09/20/1966  . Quit date: 09/20/1978  Smokeless Tobacco  . Never Used    Comment: Quit 2008    Goals Met:  Independence with exercise equipment Exercise tolerated well No report of cardiac concerns or symptoms Strength training completed today  Goals Unmet:  Not Applicable  Comments: Check out 1200.   Dr. Kate Sable is Medical Director for [facility] Cardiac and Pulmonary Rehab.

## 2017-02-02 ENCOUNTER — Encounter (HOSPITAL_COMMUNITY)
Admission: RE | Admit: 2017-02-02 | Discharge: 2017-02-02 | Disposition: A | Discharge status: Home / Self Care | Payer: Medicare HMO | Source: Ambulatory Visit | Attending: Cardiology | Admitting: Cardiology

## 2017-02-02 DIAGNOSIS — I255 Ischemic cardiomyopathy: Secondary | ICD-10-CM | POA: Diagnosis not present

## 2017-02-02 DIAGNOSIS — Z8711 Personal history of peptic ulcer disease: Secondary | ICD-10-CM | POA: Diagnosis not present

## 2017-02-02 DIAGNOSIS — I5022 Chronic systolic (congestive) heart failure: Secondary | ICD-10-CM

## 2017-02-02 DIAGNOSIS — E119 Type 2 diabetes mellitus without complications: Secondary | ICD-10-CM | POA: Diagnosis not present

## 2017-02-02 DIAGNOSIS — M1990 Unspecified osteoarthritis, unspecified site: Secondary | ICD-10-CM | POA: Diagnosis not present

## 2017-02-02 DIAGNOSIS — I2510 Atherosclerotic heart disease of native coronary artery without angina pectoris: Secondary | ICD-10-CM | POA: Diagnosis not present

## 2017-02-02 DIAGNOSIS — I110 Hypertensive heart disease with heart failure: Secondary | ICD-10-CM | POA: Diagnosis not present

## 2017-02-02 DIAGNOSIS — Z794 Long term (current) use of insulin: Secondary | ICD-10-CM | POA: Diagnosis not present

## 2017-02-02 DIAGNOSIS — Z79899 Other long term (current) drug therapy: Secondary | ICD-10-CM | POA: Diagnosis not present

## 2017-02-02 NOTE — Progress Notes (Signed)
Daily Session Note  Patient Details  Name: GEENA WEINHOLD MRN: [id number] Date of Birth: 04-26-1951 Referring Provider:     CARDIAC REHAB PHASE II ORIENTATION from 10/14/2016 in [place]  Referring Provider  Dr. Harl Bowie      Encounter Date: 02/02/2017  Check In:     Session Check In - 02/02/17 1114      Check-In   Location [facility]   Staff Present Aundra Dubin, RN, BSN;Arsal Tappan Luther Parody, BS, EP, Exercise Physiologist   Supervising physician immediately available to respond to emergencies See telemetry face sheet for immediately available MD   Medication changes reported     No   Fall or balance concerns reported    No   Warm-up and Cool-down Performed as group-led instruction   Resistance Training Performed Yes   VAD Patient? No     Pain Assessment   Currently in Pain? No/denies   Pain Score 0-No pain   Multiple Pain Sites No      Capillary Blood Glucose: No results found for this or any previous visit (from the past 24 hour(s)).    History  Smoking Status  . Former Smoker  . Packs/day: 0.25  . Years: 10.00  . Types: Cigarettes  . Start date: 09/20/1966  . Quit date: 09/20/1978  Smokeless Tobacco  . Never Used    Comment: Quit 2008    Goals Met:  Independence with exercise equipment Exercise tolerated well No report of cardiac concerns or symptoms Strength training completed today  Goals Unmet:  Not Applicable  Comments: Check out 1200   Dr. Kate Sable is Medical Director for [place] and Pulmonary Rehab.

## 2017-02-04 ENCOUNTER — Encounter (HOSPITAL_COMMUNITY)
Admission: RE | Admit: 2017-02-04 | Discharge: 2017-02-04 | Disposition: A | Discharge status: Home / Self Care | Payer: Medicare HMO | Source: Ambulatory Visit | Attending: Cardiology | Admitting: Cardiology

## 2017-02-04 DIAGNOSIS — Z794 Long term (current) use of insulin: Secondary | ICD-10-CM | POA: Diagnosis not present

## 2017-02-04 DIAGNOSIS — Z79899 Other long term (current) drug therapy: Secondary | ICD-10-CM | POA: Diagnosis not present

## 2017-02-04 DIAGNOSIS — E119 Type 2 diabetes mellitus without complications: Secondary | ICD-10-CM | POA: Diagnosis not present

## 2017-02-04 DIAGNOSIS — I5022 Chronic systolic (congestive) heart failure: Secondary | ICD-10-CM | POA: Diagnosis not present

## 2017-02-04 DIAGNOSIS — I110 Hypertensive heart disease with heart failure: Secondary | ICD-10-CM | POA: Diagnosis not present

## 2017-02-04 DIAGNOSIS — I255 Ischemic cardiomyopathy: Secondary | ICD-10-CM | POA: Diagnosis not present

## 2017-02-04 DIAGNOSIS — M1990 Unspecified osteoarthritis, unspecified site: Secondary | ICD-10-CM | POA: Diagnosis not present

## 2017-02-04 DIAGNOSIS — Z8711 Personal history of peptic ulcer disease: Secondary | ICD-10-CM | POA: Diagnosis not present

## 2017-02-04 DIAGNOSIS — I2510 Atherosclerotic heart disease of native coronary artery without angina pectoris: Secondary | ICD-10-CM | POA: Diagnosis not present

## 2017-02-04 NOTE — Progress Notes (Signed)
Daily Session Note  Patient Details  Name: [name] MRN: [id number] Date of Birth: 20-Jun-1951 Referring Provider:     CARDIAC REHAB PHASE II ORIENTATION from 10/14/2016 in [place]  Referring Provider  Dr. Harl Bowie      Encounter Date: 02/04/2017  Check In:     Session Check In - 02/04/17 1054      Check-In   Location [facility]   Staff Present Diane Angelina Pih, MS, EP, [facility], Exercise Physiologist;Jayd Forrey Luther Parody, BS, EP, Exercise Physiologist   Supervising physician immediately available to respond to emergencies See telemetry face sheet for immediately available MD   Medication changes reported     No   Fall or balance concerns reported    No   Warm-up and Cool-down Performed as group-led instruction   Resistance Training Performed Yes   VAD Patient? No     Pain Assessment   Currently in Pain? No/denies   Pain Score 0-No pain   Multiple Pain Sites No      Capillary Blood Glucose: No results found for this or any previous visit (from the past 24 hour(s)).    History  Smoking Status  . Former Smoker  . Packs/day: 0.25  . Years: 10.00  . Types: Cigarettes  . Start date: 09/20/1966  . Quit date: 09/20/1978  Smokeless Tobacco  . Never Used    Comment: Quit 2008    Goals Met:  Independence with exercise equipment Exercise tolerated well No report of cardiac concerns or symptoms Strength training completed today  Goals Unmet:  Not Applicable  Comments: Check out 1200   Dr. Kate Sable is Medical Director for [place] and Pulmonary Rehab.

## 2017-02-07 ENCOUNTER — Encounter (HOSPITAL_COMMUNITY)
Admission: RE | Admit: 2017-02-07 | Discharge: 2017-02-07 | Disposition: A | Discharge status: Home / Self Care | Payer: Medicare HMO | Source: Ambulatory Visit | Attending: Cardiology | Admitting: Cardiology

## 2017-02-07 DIAGNOSIS — I5022 Chronic systolic (congestive) heart failure: Secondary | ICD-10-CM | POA: Diagnosis not present

## 2017-02-07 DIAGNOSIS — I255 Ischemic cardiomyopathy: Secondary | ICD-10-CM | POA: Diagnosis not present

## 2017-02-07 DIAGNOSIS — I2510 Atherosclerotic heart disease of native coronary artery without angina pectoris: Secondary | ICD-10-CM | POA: Diagnosis not present

## 2017-02-07 DIAGNOSIS — E119 Type 2 diabetes mellitus without complications: Secondary | ICD-10-CM | POA: Diagnosis not present

## 2017-02-07 DIAGNOSIS — Z8711 Personal history of peptic ulcer disease: Secondary | ICD-10-CM | POA: Diagnosis not present

## 2017-02-07 DIAGNOSIS — M1990 Unspecified osteoarthritis, unspecified site: Secondary | ICD-10-CM | POA: Diagnosis not present

## 2017-02-07 DIAGNOSIS — I110 Hypertensive heart disease with heart failure: Secondary | ICD-10-CM | POA: Diagnosis not present

## 2017-02-07 DIAGNOSIS — Z79899 Other long term (current) drug therapy: Secondary | ICD-10-CM | POA: Diagnosis not present

## 2017-02-07 DIAGNOSIS — Z794 Long term (current) use of insulin: Secondary | ICD-10-CM | POA: Diagnosis not present

## 2017-02-07 NOTE — Progress Notes (Signed)
Daily Session Note  Patient Details  Name: MAKENAH KARAS MRN: [id number] Date of Birth: 07-Jul-1951 Referring Provider:     CARDIAC REHAB PHASE II ORIENTATION from 10/14/2016 in [place]  Referring Provider  Dr. Harl Bowie      Encounter Date: 02/07/2017  Check In:     Session Check In - 02/07/17 1131      Check-In   Location [facility]   Staff Present Diane Angelina Pih, MS, EP, [facility], Exercise Physiologist;Abeer Iversen Luther Parody, BS, EP, Exercise Physiologist   Supervising physician immediately available to respond to emergencies See telemetry face sheet for immediately available MD   Medication changes reported     No   Fall or balance concerns reported    No   Warm-up and Cool-down Performed as group-led instruction   Resistance Training Performed Yes   VAD Patient? No     Pain Assessment   Currently in Pain? No/denies   Pain Score 0-No pain   Multiple Pain Sites No      Capillary Blood Glucose: No results found for this or any previous visit (from the past 24 hour(s)).    History  Smoking Status  . Former Smoker  . Packs/day: 0.25  . Years: 10.00  . Types: Cigarettes  . Start date: 09/20/1966  . Quit date: 09/20/1978  Smokeless Tobacco  . Never Used    Comment: Quit 2008    Goals Met:  Independence with exercise equipment Exercise tolerated well No report of cardiac concerns or symptoms Strength training completed today  Goals Unmet:  Not Applicable  Comments: Check out 1200   Dr. Kate Sable is Medical Director for [place] and Pulmonary Rehab.

## 2017-02-09 ENCOUNTER — Encounter (HOSPITAL_COMMUNITY)
Admission: RE | Admit: 2017-02-09 | Discharge: 2017-02-09 | Disposition: A | Discharge status: Home / Self Care | Payer: Medicare HMO | Source: Ambulatory Visit | Attending: Cardiology | Admitting: Cardiology

## 2017-02-09 DIAGNOSIS — Z8711 Personal history of peptic ulcer disease: Secondary | ICD-10-CM | POA: Diagnosis not present

## 2017-02-09 DIAGNOSIS — I5022 Chronic systolic (congestive) heart failure: Secondary | ICD-10-CM

## 2017-02-09 DIAGNOSIS — E119 Type 2 diabetes mellitus without complications: Secondary | ICD-10-CM | POA: Diagnosis not present

## 2017-02-09 DIAGNOSIS — Z79899 Other long term (current) drug therapy: Secondary | ICD-10-CM | POA: Diagnosis not present

## 2017-02-09 DIAGNOSIS — I255 Ischemic cardiomyopathy: Secondary | ICD-10-CM | POA: Diagnosis not present

## 2017-02-09 DIAGNOSIS — Z794 Long term (current) use of insulin: Secondary | ICD-10-CM | POA: Diagnosis not present

## 2017-02-09 DIAGNOSIS — M1990 Unspecified osteoarthritis, unspecified site: Secondary | ICD-10-CM | POA: Diagnosis not present

## 2017-02-09 DIAGNOSIS — I110 Hypertensive heart disease with heart failure: Secondary | ICD-10-CM | POA: Diagnosis not present

## 2017-02-09 DIAGNOSIS — I2510 Atherosclerotic heart disease of native coronary artery without angina pectoris: Secondary | ICD-10-CM | POA: Diagnosis not present

## 2017-02-09 NOTE — Progress Notes (Signed)
Daily Session Note  Patient Details  Name: [name] MRN: [id number] Date of Birth: [birth year] Referring Provider:     CARDIAC REHAB PHASE II ORIENTATION from 10/14/2016 in [place]  Referring Provider  Dr. Harl Bowie      Encounter Date: 02/09/2017  Check In:     Session Check In - 02/09/17 1111      Check-In   Location [facility]   Staff Present Aundra Dubin, RN, BSN;Jamir Rone Luther Parody, BS, EP, Exercise Physiologist   Supervising physician immediately available to respond to emergencies See telemetry face sheet for immediately available MD   Medication changes reported     No   Fall or balance concerns reported    No   Warm-up and Cool-down Performed as group-led instruction   Resistance Training Performed Yes   VAD Patient? No     Pain Assessment   Currently in Pain? No/denies   Pain Score 0-No pain   Multiple Pain Sites No      Capillary Blood Glucose: No results found for this or any previous visit (from the past 24 hour(s)).    History  Smoking Status  . Former Smoker  . Packs/day: 0.25  . Years: 10.00  . Types: Cigarettes  . Start date: 09/20/1966  . Quit date: 09/20/1978  Smokeless Tobacco  . Never Used    Comment: Quit 2008    Goals Met:  Independence with exercise equipment Exercise tolerated well No report of cardiac concerns or symptoms Strength training completed today  Goals Unmet:  Not Applicable  Comments: Check out 1200   Dr. Kate Sable is Medical Director for [place] and Pulmonary Rehab.

## 2017-02-09 NOTE — Progress Notes (Signed)
Cardiac Individual Treatment Plan  Patient Details  Name: JERZEY KOMPERDA MRN: [id number] Date of Birth: [birth year] Referring Provider:     [place] from 10/14/2016 in [place]  Referring Provider  Dr. Harl Bowie      Initial Encounter Date:    CARDIAC REHAB PHASE II ORIENTATION from 10/14/2016 in [place]  Date  10/14/16  Referring Provider  Dr. Harl Bowie      Visit Diagnosis: Chronic systolic CHF (congestive heart failure) ([place])  Patient's Home Medications on Admission:  Current Outpatient Prescriptions:  .  acetaminophen (TYLENOL) 500 MG tablet, Take 1,000 mg by mouth every 6 (six) hours as needed for moderate pain. , Disp: , Rfl:  .  aspirin 81 MG chewable tablet, Chew 1 tablet (81 mg total) by mouth daily., Disp: , Rfl:  .  atorvastatin (LIPITOR) 80 MG tablet, Take 1 tablet (80 mg total) by mouth daily at 6 PM., Disp: 90 tablet, Rfl: 3 .  carvedilol (COREG) 25 MG tablet, Take 1 tablet (25 mg total) by mouth 2 (two) times daily., Disp: 180 tablet, Rfl: 3 .  furosemide (LASIX) 40 MG tablet, Take 1 tablet (40 mg total) by mouth daily., Disp: 30 tablet, Rfl: 6 .  insulin glargine (LANTUS) 100 UNIT/ML injection, Inject 40 Units into the skin 2 (two) times daily., Disp: , Rfl:  .  losartan (COZAAR) 50 MG tablet, TAKE 1 TABLET EVERY DAY, Disp: 90 tablet, Rfl: 3 .  Multiple Vitamin (MULTIVITAMIN) tablet, Take 1 tablet by mouth daily., Disp: , Rfl:  .  NOVOLOG 100 UNIT/ML injection, Inject 10 Units into the skin 3 (three) times daily with meals. , Disp: , Rfl:  .  polyethylene glycol (MIRALAX / GLYCOLAX) packet, Take 17 g by mouth daily., Disp: , Rfl:  .  ranitidine (ZANTAC) 150 MG tablet, Take 150 mg by mouth 2 (two) times daily., Disp: , Rfl:  .  spironolactone (ALDACTONE) 25 MG tablet, Take 1 tablet (25 mg total) by mouth daily., Disp: 90 tablet, Rfl: 3  Past Medical History: Past Medical History:  Diagnosis Date   . Anemia   . Arthritis   . CAD (coronary artery disease)    a. LHC (03/2014) [place]: nl, LAD: diff dz proximal 20%, 30-40% dz mid vessel prior 2nd diagonal, LCx: 40% in OM1, 20-30% in OM2, RCA: 99% subtotal occlusion @ crux, TIMI 1 flow, L-R collaterals to distal vessel  . Chronic systolic heart failure ([place])    a. ECHO (03/2014): EF 25-30%, akinesis enteroanteroseptal myocardium, grade III DD b. RHC (03/2014) RA 4, RV 42/4, PA 44/14 (26), PCWP 21, PA 62% Fick CO/CI 6.9 / 3.4  . Diabetes mellitus   . Duodenal ulcer    Age 66  . Erosive esophagitis   . Gastritis   . Hypertension   . Ischemic cardiomyopathy   . Obese   . Short-term memory loss   . Thyroid disease   . TTP (thrombotic thrombocytopenic purpura) ([facility])     Tobacco Use: History  Smoking Status  . Former Smoker  . Packs/day: 0.25  . Years: 10.00  . Types: Cigarettes  . Start date: 09/20/1966  . Quit date: 09/20/1978  Smokeless Tobacco  . Never Used    Comment: Quit 2008    Labs: Recent Review Flowsheet Data    Labs for ITP Cardiac and Pulmonary Rehab Latest Ref Rng & Units 03/23/2014 03/24/2014 03/28/2014 03/28/2014 03/28/2014   Hemoglobin A1c <5.7 % - - - - -  PHART 7.350 - 7.450 7.393 - - 7.436 7.413   PCO2ART 35.0 - 45.0 mmHg 35.3 - - 43.8 40.7   HCO3 20.0 - 24.0 mEq/L 21.6 - 29.1(H) 29.4(H) 26.0(H)   TCO2 0 - 100 mmol/L 23 - _0 ACIDBASEDEF 0.0 - 2.0 mmol/L 3.0(H) - - - -   O2SAT % 94.0 68.5 62.0 84.0 90.0      Capillary Blood Glucose: Lab Results  Component Value Date   GLUCAP 101 (H) 06/14/2015   GLUCAP 83 05/28/2015   GLUCAP 156 (H) 04/15/2014   GLUCAP 107 (H) 04/15/2014   GLUCAP 73 04/14/2014     Exercise Target Goals:    Exercise Program Goal: Individual exercise prescription set with THRR, safety & activity barriers. Participant demonstrates ability to understand and report RPE using BORG scale, to self-measure pulse accurately, and to acknowledge the importance of the exercise  prescription.  Exercise Prescription Goal: Starting with aerobic activity 30 plus minutes a day, 3 days per week for initial exercise prescription. Provide home exercise prescription and guidelines that participant acknowledges understanding prior to discharge.  Activity Barriers & Risk Stratification:     Activity Barriers & Cardiac Risk Stratification - 10/14/16 1501      Activity Barriers & Cardiac Risk Stratification   Activity Barriers None   Cardiac Risk Stratification High      6 Minute Walk:     6 Minute Walk    Row Name 10/14/16 1458         6 Minute Walk   Phase Initial     Distance 700 feet  Patient had to stop and rest for 30 seconds     Distance % Change 0 %     Walk Time 6 minutes     # of Rest Breaks 0     MPH 1.3     METS 2.01     RPE 15     Perceived Dyspnea  13     VO2 Peak 4.08     Symptoms No     Resting HR 86 bpm     Resting BP 88/62     Max Ex. HR 98 bpm     Max Ex. BP 120/66     2 Minute Post BP 94/60        Oxygen Initial Assessment:   Oxygen Re-Evaluation:   Oxygen Discharge (Final Oxygen Re-Evaluation):   Initial Exercise Prescription:     Initial Exercise Prescription - 10/14/16 1400      Date of Initial Exercise RX and Referring Provider   Date 10/14/16   Referring Provider Dr. Harl Bowie     NuStep   Level 2   SPM 10   Minutes 20   METs 1.6     Arm Ergometer   Level 2   Watts 19   Minutes 15   METs 2.5     Prescription Details   Frequency (times per week) 3   Duration Progress to 30 minutes of continuous aerobic without signs/symptoms of physical distress     Intensity   THRR REST +  30   THRR 40-80% of Max Heartrate 113-126-140   Ratings of Perceived Exertion 11-13   Perceived Dyspnea 0-4     Progression   Progression Continue progressive overload as per policy without signs/symptoms or physical distress.     Resistance Training   Training Prescription Yes   Weight 1   Reps 10-12      Perform  Capillary Blood  Glucose checks as needed.  Exercise Prescription Changes:      Exercise Prescription Changes    Row Name 10/25/16 1400 11/22/16 1300 12/08/16 1000 12/20/16 1300 01/10/17 1400     Response to Exercise   Blood Pressure (Admit) 100/50 104/64 122/70 98/60 124/68   Blood Pressure (Exercise) 110/66 124/66 126/74 108/66 116/66   Blood Pressure (Exit) 94/60 100/62 114/68 100/62 100/62   Heart Rate (Admit) 84 bpm 82 bpm 87 bpm 85 bpm 87 bpm   Heart Rate (Exercise) 94 bpm 11 bpm 101 bpm 95 bpm 103 bpm   Heart Rate (Exit) 90 bpm 90 bpm 95 bpm 89 bpm 95 bpm   Rating of Perceived Exertion (Exercise) _0 Duration Progress to 30 minutes of continuous aerobic without signs/symptoms of physical distress Progress to 30 minutes of  aerobic without signs/symptoms of physical distress Progress to 30 minutes of  aerobic without signs/symptoms of physical distress Progress to 30 minutes of  aerobic without signs/symptoms of physical distress Progress to 30 minutes of  aerobic without signs/symptoms of physical distress   Intensity Rest + 30 THRR unchanged THRR unchanged THRR unchanged THRR unchanged     Progression   Progression Continue progressive overload as per policy without signs/symptoms or physical distress. Continue to progress workloads to maintain intensity without signs/symptoms of physical distress. Continue to progress workloads to maintain intensity without signs/symptoms of physical distress. Continue to progress workloads to maintain intensity without signs/symptoms of physical distress. Continue to progress workloads to maintain intensity without signs/symptoms of physical distress.     Resistance Training   Training Prescription _1    Weight _2 Reps 10-12 10-15 10-15 10-15 10-15     NuStep   Level _3 SPM 14 33 _4 Minutes _5 METs 3.71 3.71 3.71 3.72 3.72     Arm Ergometer   Level 1._6 2.5 2.5    Watts _7 Minutes _8 METs 1._9 2.5 2.5     Home Exercise Plan   Plans to continue exercise at [place] (comment) Home (comment) Home (comment) Home (comment)   Frequency Add 2 additional days to program exercise sessions. Add 2 additional days to program exercise sessions. Add 2 additional days to program exercise sessions. Add 2 additional days to program exercise sessions. Add 2 additional days to program exercise sessions.     Exercise Review   Progression No Yes Yes Yes Yes   Row Name 02/04/17 1400             Response to Exercise   Blood Pressure (Admit) 150/82       Blood Pressure (Exercise) 124/78       Blood Pressure (Exit) 118/70       Heart Rate (Admit) 78 bpm       Heart Rate (Exercise) 105 bpm       Heart Rate (Exit) 88 bpm       Rating of Perceived Exertion (Exercise) 11       Duration Progress to 30 minutes of  aerobic without signs/symptoms of physical distress       Intensity THRR unchanged         Progression   Progression Continue to progress workloads to maintain intensity without signs/symptoms of physical distress.  Resistance Training   Training Prescription Yes       Weight 1       Reps 10-15         NuStep   Level 2       SPM 32       Minutes 15       METs 3.72         Arm Ergometer   Level 2.7       Watts 8       Minutes 20       METs 2.7         Home Exercise Plan   Plans to continue exercise at Home (comment)       Frequency Add 2 additional days to program exercise sessions.         Exercise Review   Progression Yes          Exercise Comments:      Exercise Comments    Row Name 10/25/16 1441 11/22/16 1318 12/08/16 1048 12/20/16 1313 01/10/17 1440   Exercise Comments Patient has only recently began CR and will be progressed in time.  Patient is doing well in CR. She has gone down in level on Nustep due to weakness in knees but has progressed in upper body strength due to teh arm ergometer   Patient is doing well and is progressing slowly  Patient is progressing slowly but well on her machines in CR.  Patient has lacked in attendance but she has increased her watts but not her levels.    [place] Name 02/04/17 1459           Exercise Comments Patient is doing well in CR.           Exercise Goals and Review:   Exercise Goals Re-Evaluation :     Exercise Goals Re-Evaluation    Row Name 11/23/16 1028 12/23/16 0821 01/12/17 1513 02/09/17 1449       Exercise Goal Re-Evaluation   Exercise Goals Review Increase Strenth and Stamina;Increase Physical Activity Increase Physical Activity;Increase Strenth and Stamina  Get around more; get stronger. Increase Physical Activity;Increase Strenth and Stamina  Get around more; get stronger. Increase Physical Activity;Increase Strenth and Stamina  Get around more; get stronger.     Comments Patient's strength and stamina are increasing. She is able to do more around her home and feels better overall.  After 20 sessions, patient is progressing well with increased strength and stamina. She says this is helping her a lot and she feels better and stronger and is able to do more around the house. After completing 24 sessions, patient has had some progression but has been inconsistent in her attendance impeding her progress. She says she feels stronger and has more energy. After completing 34 sessions, patient has progressed well with increased strength, stamina and activity. She reports having more energy and being able to do a lot more around house now without fatigue.    Expected Outcomes Patient will continue to progress.  Patient will complete the program with continued increased strength, stamina, and activity. Patient will complete the program with increased strength, stamina, and activity. Patient will graduate with continued increased strength, stamina, and activity.        Discharge Exercise Prescription (Final Exercise Prescription  Changes):     Exercise Prescription Changes - 02/04/17 1400      Response to Exercise   Blood Pressure (Admit) 150/82   Blood Pressure (Exercise) 124/78   Blood Pressure (  Exit) 118/70   Heart Rate (Admit) 78 bpm   Heart Rate (Exercise) 105 bpm   Heart Rate (Exit) 88 bpm   Rating of Perceived Exertion (Exercise) 11   Duration Progress to 30 minutes of  aerobic without signs/symptoms of physical distress   Intensity THRR unchanged     Progression   Progression Continue to progress workloads to maintain intensity without signs/symptoms of physical distress.     Resistance Training   Training Prescription Yes   Weight 1   Reps 10-15     NuStep   Level 2   SPM 32   Minutes 15   METs 3.72     Arm Ergometer   Level 2.7   Watts 8   Minutes 20   METs 2.7     Home Exercise Plan   Plans to continue exercise at Home (comment)   Frequency Add 2 additional days to program exercise sessions.     Exercise Review   Progression Yes      Nutrition:  Target Goals: Understanding of nutrition guidelines, daily intake of sodium <1554m, cholesterol <2035m calories 30% from fat and 7% or less from saturated fats, daily to have 5 or more servings of fruits and vegetables.  Biometrics:     Pre Biometrics - 10/14/16 1501      Pre Biometrics   Height _0  (1.651 m)   Weight 244 lb 4.3 oz (110.8 kg)   Waist Circumference 47.5 inches   Hip Circumference 50.5 inches   Waist to Hip Ratio 0.94 %   BMI (Calculated) 40.7   Triceps Skinfold 26 mm   % Body Fat 49.7 %   Grip Strength 51.33 kg   Flexibility 0 in   Single Leg Stand 7 seconds       Nutrition Therapy Plan and Nutrition Goals:   Nutrition Discharge: Rate Your Plate Scores:     Nutrition Assessments - 10/14/16 1522      MEDFICTS Scores   Pre Score 50      Nutrition Goals Re-Evaluation:   Nutrition Goals Discharge (Final Nutrition Goals Re-Evaluation):   Psychosocial: Target Goals: Acknowledge presence  or absence of significant depression and/or stress, maximize coping skills, provide positive support system. Participant is able to verbalize types and ability to use techniques and skills needed for reducing stress and depression.  Initial Review & Psychosocial Screening:     Initial Psych Review & Screening - 10/14/16 1530      Initial Review   Current issues with Current Sleep Concerns     Family Dynamics   Good Support System? Yes     Barriers   Psychosocial barriers to participate in program There are no identifiable barriers or psychosocial needs.     Screening Interventions   Interventions Encouraged to exercise      Quality of Life Scores:     Quality of Life - 10/14/16 1502      Quality of Life Scores   Health/Function Pre 17.1 %   Socioeconomic Pre 24.13 %   Psych/Spiritual Pre 24 %   Family Pre 23.7 %   GLOBAL Pre 21.03 %      PHQ-9: Recent Review Flowsheet Data    [facility] 10/14/2016 07/19/2016 01/30/2015 01/01/2015   Decreased Interest 0 0 0 0   Down, Depressed, Hopeless 0 0 0 0    PHQ - 2 Score 0 0 0 0   Altered sleeping 1 - - -   Tired, decreased energy 2 - - -  Change in appetite 2 - - -   Feeling bad or failure about yourself  0 - - -   Trouble concentrating 2 - - -   Moving slowly or fidgety/restless 0 - - -   Suicidal thoughts 0 - - -   PHQ-9 Score 7 - - -   Difficult doing work/chores Somewhat difficult - - -     Interpretation of Total Score  Total Score Depression Severity:  1-4 = Minimal depression, 5-9 = Mild depression, 10-14 = Moderate depression, 15-19 = Moderately severe depression, 20-27 = Severe depression   Psychosocial Evaluation and Intervention:     Psychosocial Evaluation - 10/14/16 1530      Psychosocial Evaluation & Interventions   Interventions Encouraged to exercise with the program and follow exercise prescription   Continue Psychosocial Services  No      Psychosocial Re-Evaluation:      Psychosocial Re-Evaluation    Row Name 10/28/16 0932 11/23/16 1029 12/23/16 0823 01/12/17 1515 02/09/17 1451     Psychosocial Re-Evaluation   Current issues with  - None Identified None Identified None Identified None Identified   Comments Patient's QOL score was 21.03 and her PHQ-9 score was 7. She does not feel she is depressed and does not feel she need counseling.  Patient's QOL was 21.03 scoring lower in health function and her PHQ-9 was 7. She says she is not depressed.   -  -  -   Expected Outcomes  -  - Patient's QOL and PHQ-9 scores will improve at discharge with no psychosocial issues identified.  Patient will have no psychosocial issues identified at discharge.  Patient will have no psychosocial issues identified at discharge.    Interventions Encouraged to attend Cardiac Rehabilitation for the exercise Encouraged to attend Cardiac Rehabilitation for the exercise Encouraged to attend Cardiac Rehabilitation for the exercise Encouraged to attend Cardiac Rehabilitation for the exercise Encouraged to attend Cardiac Rehabilitation for the exercise   Continue Psychosocial Services  No No Follow up required No Follow up required No Follow up required No Follow up required      Psychosocial Discharge (Final Psychosocial Re-Evaluation):     Psychosocial Re-Evaluation - 02/09/17 1451      Psychosocial Re-Evaluation   Current issues with None Identified   Expected Outcomes Patient will have no psychosocial issues identified at discharge.    Interventions Encouraged to attend Cardiac Rehabilitation for the exercise   Continue Psychosocial Services  No Follow up required      Vocational Rehabilitation: Provide vocational rehab assistance to qualifying candidates.   Vocational Rehab Evaluation & Intervention:     Vocational Rehab - 10/14/16 1502      Initial Vocational Rehab Evaluation & Intervention   Assessment shows need for Vocational Rehabilitation No       Education: Education Goals: Education classes will be provided on a weekly basis, covering required topics. Participant will state understanding/return demonstration of topics presented.  Learning Barriers/Preferences:     Learning Barriers/Preferences - 10/14/16 1501      Learning Barriers/Preferences   Learning Preferences Pictoral;Video;Written Material      Education Topics: Hypertension, Hypertension Reduction -Define heart disease and high blood pressure. Discus how high blood pressure affects the body and ways to reduce high blood pressure.   CARDIAC REHAB PHASE II EXERCISE from 02/02/2017 in [place]  Date  (P) 10/20/16  Educator  (P) DC  Instruction Review Code  (P) 2- meets goals/outcomes  Exercise and Your Heart -Discuss why it is important to exercise, the FITT principles of exercise, normal and abnormal responses to exercise, and how to exercise safely.   CARDIAC REHAB PHASE II EXERCISE from 02/02/2017 in [place]  Date  (P) 10/27/16  Educator  (P) DC  Instruction Review Code  (P) 2- meets goals/outcomes      Angina -Discuss definition of angina, causes of angina, treatment of angina, and how to decrease risk of having angina.   CARDIAC REHAB PHASE II EXERCISE from 02/02/2017 in [place]  Date  (P) 11/03/16  Educator  (P) DJ  Instruction Review Code  (P) 2- meets goals/outcomes      Cardiac Medications -Review what the following cardiac medications are used for, how they affect the body, and side effects that may occur when taking the medications.  Medications include Aspirin, Beta blockers, calcium channel blockers, ACE Inhibitors, angiotensin receptor blockers, diuretics, digoxin, and antihyperlipidemics.   Congestive Heart Failure -Discuss the definition of CHF, how to live with CHF, the signs and symptoms of CHF, and how keep track of weight and sodium intake.   CARDIAC  REHAB PHASE II EXERCISE from 02/02/2017 in [place]  Date  (P) 11/17/16  Educator  (P) DC  Instruction Review Code  (P) 2- meets goals/outcomes      Heart Disease and Intimacy -Discus the effect sexual activity has on the heart, how changes occur during intimacy as we age, and safety during sexual activity.   CARDIAC REHAB PHASE II EXERCISE from 02/02/2017 in [place]  Date  (P) 11/24/16  Educator  (P) Dc  Instruction Review Code  (P) 2- meets goals/outcomes      Smoking Cessation / COPD -Discuss different methods to quit smoking, the health benefits of quitting smoking, and the definition of COPD.   Nutrition I: Fats -Discuss the types of cholesterol, what cholesterol does to the heart, and how cholesterol levels can be controlled.   Nutrition II: Labels -Discuss the different components of food labels and how to read food label   [place] from 02/02/2017 in [place]  Date  (P) 12/15/16  Educator  (P) DC  Instruction Review Code  (P) 2- meets goals/outcomes      Heart Parts and Heart Disease -Discuss the anatomy of the heart, the pathway of blood circulation through the heart, and these are affected by heart disease.   Stress I: Signs and Symptoms -Discuss the causes of stress, how stress may lead to anxiety and depression, and ways to limit stress.   Stress II: Relaxation -Discuss different types of relaxation techniques to limit stress.   Warning Signs of Stroke / TIA -Discuss definition of a stroke, what the signs and symptoms are of a stroke, and how to identify when someone is having stroke.   CARDIAC REHAB PHASE II EXERCISE from 02/02/2017 in [place]  Date  (P) 01/12/17  Educator  (P) DC  Instruction Review Code  (P) 2- meets goals/outcomes      Knowledge Questionnaire Score:     Knowledge Questionnaire Score - 10/14/16 1502       Knowledge Questionnaire Score   Pre Score 16/24      Core Components/Risk Factors/Patient Goals at Admission:     Personal Goals and Risk Factors at Admission - 10/14/16 1522      Core Components/Risk Factors/Patient Goals on Admission    Weight  Management Yes   Admit Weight 244 lb 8 oz (110.9 kg)   Goal Weight: Short Term 234 lb 8 oz (106.4 kg)   Goal Weight: Long Term 224 lb 8 oz (101.8 kg)   Expected Outcomes Short Term: Continue to assess and modify interventions until short term weight is achieved;Long Term: Adherence to nutrition and physical activity/exercise program aimed toward attainment of established weight goal   Sedentary Yes   Intervention Provide advice, education, support and counseling about physical activity/exercise needs.;Develop an individualized exercise prescription for aerobic and resistive training based on initial evaluation findings, risk stratification, comorbidities and participant's personal goals.   Expected Outcomes Achievement of increased cardiorespiratory fitness and enhanced flexibility, muscular endurance and strength shown through measurements of functional capacity and personal statement of participant.   Increase Strength and Stamina Yes   Intervention Provide advice, education, support and counseling about physical activity/exercise needs.;Develop an individualized exercise prescription for aerobic and resistive training based on initial evaluation findings, risk stratification, comorbidities and participant's personal goals.   Expected Outcomes Achievement of increased cardiorespiratory fitness and enhanced flexibility, muscular endurance and strength shown through measurements of functional capacity and personal statement of participant.   Diabetes Yes   Intervention Provide education about signs/symptoms and action to take for hypo/hyperglycemia.;Provide education about proper nutrition, including hydration, and aerobic/resistive exercise  prescription along with prescribed medications to achieve blood glucose in normal ranges: Fasting glucose 65-99 mg/dL   Expected Outcomes Short Term: Participant verbalizes understanding of the signs/symptoms and immediate care of hyper/hypoglycemia, proper foot care and importance of medication, aerobic/resistive exercise and nutrition plan for blood glucose control.;Long Term: Attainment of HbA1C < 7%.   Heart Failure Yes   Intervention Provide a combined exercise and nutrition program that is supplemented with education, support and counseling about heart failure. Directed toward relieving symptoms such as shortness of breath, decreased exercise tolerance, and extremity edema.   Expected Outcomes Long term: Adoption of self-care skills and reduction of barriers for early signs and symptoms recognition and intervention leading to self-care maintenance.;Short term: Attendance in program 2-3 days a week with increased exercise capacity. Reported lower sodium intake. Reported increased fruit and vegetable intake. Reports medication compliance.   Personal Goal Other Yes   Personal Goal Get around more, Get stronger, lose weight   Intervention Attend CR 3xweek and supplement with 2xweek at home   Expected Outcomes Reach personal goals.       Core Components/Risk Factors/Patient Goals Review:      Goals and Risk Factor Review    Row Name 10/14/16 1529 10/28/16 0928 11/23/16 1023 12/23/16 0818 01/12/17 1509     Core Components/Risk Factors/Patient Goals Review   Personal Goals Review Weight Management/Obesity;Sedentary;Increase Strength and Stamina;Heart Failure;Diabetes Weight Management/Obesity;Sedentary;Increase Strength and Stamina;Diabetes  Get around more; Get stronger; lose 50 lbs.  Weight Management/Obesity;Diabetes;Heart Failure  Get around more; get stronger; lose 50 lbs.  Weight Management/Obesity;Diabetes  Lose 50 lbs.  Weight Management/Obesity;Diabetes  Lose 50 lbs.    Review  -  Patient has attended 5 sessions. Will continue to monitor for progress.  Patient has completed 13 sessions. She has maintained her weight. She has progressed well in the program. She says she is feeling stronger and feels like doing more at home.  Patient has completed 20 sessions gaining 3.3 lbs. Her reported fasting glucose readings are usually WNL.  Patient has completed 24 sessions gaining 2.2 lbs. Patient has been inconsistent in her attendance impeding her progress. Her DM is controlled.  Expected Outcomes  - Patient will continue to attend sessions meeting her personal goals.  Patient will complete the program meeting her personal goals.  Patient will complete the program and start meeting her weight loss goal.  Patient will be more consistent in her attendance and complete the program meeting her personal goals.   Central Name 02/09/17 1446             Core Components/Risk Factors/Patient Goals Review   Personal Goals Review Weight Management/Obesity;Diabetes  Lose 50 lbs.        Review Patient has completed 34 sessions gaining 1.9 lbs. Her DM is controlled and she reports eating healthier. She says she feels much better overall and feels the program has really benefited her. She has not met her weight loss goal but is happy she has not gained weight and hopes she will begin to work toward losing weight.        Expected Outcomes Patient will graduate meeting her personal goals.           Core Components/Risk Factors/Patient Goals at Discharge (Final Review):      Goals and Risk Factor Review - 02/09/17 1446      Core Components/Risk Factors/Patient Goals Review   Personal Goals Review Weight Management/Obesity;Diabetes  Lose 50 lbs.    Review Patient has completed 34 sessions gaining 1.9 lbs. Her DM is controlled and she reports eating healthier. She says she feels much better overall and feels the program has really benefited her. She has not met her weight loss goal but is happy she  has not gained weight and hopes she will begin to work toward losing weight.    Expected Outcomes Patient will graduate meeting her personal goals.       ITP Comments:     ITP Comments    Row Name 11/22/16 1503           ITP Comments Patient met with Registered Dietitian to discuss nutrition topics including: Heart healthty eating, heart health cooking and make smart choices when shopping; Portion control; weight management; and hydration. Patient attended a group session with the hospital chaplian called [facility] to discuss and share how her recent cardiac diagnosis has effected her life.          Comments: ITP 30 Day REVIEW Patient doing well in the program. Will continue to monitor for progress.

## 2017-02-11 ENCOUNTER — Encounter (HOSPITAL_COMMUNITY)
Admission: RE | Admit: 2017-02-11 | Discharge: 2017-02-11 | Disposition: A | Discharge status: Home / Self Care | Payer: Medicare HMO | Source: Ambulatory Visit | Attending: Cardiology | Admitting: Cardiology

## 2017-02-11 DIAGNOSIS — E119 Type 2 diabetes mellitus without complications: Secondary | ICD-10-CM | POA: Diagnosis not present

## 2017-02-11 DIAGNOSIS — Z79899 Other long term (current) drug therapy: Secondary | ICD-10-CM | POA: Diagnosis not present

## 2017-02-11 DIAGNOSIS — I110 Hypertensive heart disease with heart failure: Secondary | ICD-10-CM | POA: Diagnosis not present

## 2017-02-11 DIAGNOSIS — Z8711 Personal history of peptic ulcer disease: Secondary | ICD-10-CM | POA: Diagnosis not present

## 2017-02-11 DIAGNOSIS — I2510 Atherosclerotic heart disease of native coronary artery without angina pectoris: Secondary | ICD-10-CM | POA: Diagnosis not present

## 2017-02-11 DIAGNOSIS — I5022 Chronic systolic (congestive) heart failure: Secondary | ICD-10-CM | POA: Diagnosis not present

## 2017-02-11 DIAGNOSIS — Z794 Long term (current) use of insulin: Secondary | ICD-10-CM | POA: Diagnosis not present

## 2017-02-11 DIAGNOSIS — M1990 Unspecified osteoarthritis, unspecified site: Secondary | ICD-10-CM | POA: Diagnosis not present

## 2017-02-11 DIAGNOSIS — I255 Ischemic cardiomyopathy: Secondary | ICD-10-CM | POA: Diagnosis not present

## 2017-02-11 NOTE — Progress Notes (Signed)
Daily Session Note  Patient Details  Name: [name] MRN: [id number] Date of Birth: 07/18/1951 Referring Provider:     CARDIAC REHAB PHASE II ORIENTATION from 10/14/2016 in [place]  Referring Provider  Dr. Harl Bowie      Encounter Date: 02/11/2017  Check In:     Session Check In - 02/11/17 1134      Check-In   Location [facility]   Staff Present Hind Chesler Angelina Pih, MS, EP, [facility], Exercise Physiologist;Gregory Luther Parody, BS, EP, Exercise Physiologist   Supervising physician immediately available to respond to emergencies See telemetry face sheet for immediately available MD   Medication changes reported     No   Fall or balance concerns reported    No   Tobacco Cessation No Change   Warm-up and Cool-down Performed as group-led instruction   Resistance Training Performed Yes   VAD Patient? No     Pain Assessment   Currently in Pain? No/denies   Pain Score 0-No pain   Multiple Pain Sites No      Capillary Blood Glucose: No results found for this or any previous visit (from the past 24 hour(s)).    History  Smoking Status  . Former Smoker  . Packs/day: 0.25  . Years: 10.00  . Types: Cigarettes  . Start date: 09/20/1966  . Quit date: 09/20/1978  Smokeless Tobacco  . Never Used    Comment: Quit 2008    Goals Met:  Independence with exercise equipment Exercise tolerated well No report of cardiac concerns or symptoms Strength training completed today  Goals Unmet:  Not Applicable  Comments: Check out: 12:00   Dr. Kate Sable is Medical Director for [place] and [facility] Re[facility].

## 2017-02-16 ENCOUNTER — Encounter (HOSPITAL_COMMUNITY)
Admission: RE | Admit: 2017-02-16 | Discharge: 2017-02-16 | Disposition: A | Discharge status: Home / Self Care | Payer: Medicare HMO | Source: Ambulatory Visit | Attending: Cardiology | Admitting: Cardiology

## 2017-02-16 VITALS — Ht 65.0 in | Wt 245.8 lb

## 2017-02-16 DIAGNOSIS — Z79899 Other long term (current) drug therapy: Secondary | ICD-10-CM | POA: Diagnosis not present

## 2017-02-16 DIAGNOSIS — I110 Hypertensive heart disease with heart failure: Secondary | ICD-10-CM | POA: Diagnosis not present

## 2017-02-16 DIAGNOSIS — I5022 Chronic systolic (congestive) heart failure: Secondary | ICD-10-CM | POA: Diagnosis not present

## 2017-02-16 DIAGNOSIS — Z8711 Personal history of peptic ulcer disease: Secondary | ICD-10-CM | POA: Diagnosis not present

## 2017-02-16 DIAGNOSIS — E119 Type 2 diabetes mellitus without complications: Secondary | ICD-10-CM | POA: Diagnosis not present

## 2017-02-16 DIAGNOSIS — I255 Ischemic cardiomyopathy: Secondary | ICD-10-CM | POA: Diagnosis not present

## 2017-02-16 DIAGNOSIS — M1990 Unspecified osteoarthritis, unspecified site: Secondary | ICD-10-CM | POA: Diagnosis not present

## 2017-02-16 DIAGNOSIS — Z794 Long term (current) use of insulin: Secondary | ICD-10-CM | POA: Diagnosis not present

## 2017-02-16 DIAGNOSIS — I2510 Atherosclerotic heart disease of native coronary artery without angina pectoris: Secondary | ICD-10-CM | POA: Diagnosis not present

## 2017-02-16 NOTE — Progress Notes (Signed)
Daily Session Note  Patient Details  Name: [name] MRN: [id number] Date of Birth: Oct 03, 1950 Referring Provider:     CARDIAC REHAB PHASE II ORIENTATION from 10/14/2016 in [place]  Referring Provider  Dr. Harl Bowie      Encounter Date: 02/16/2017  Check In:     Session Check In - 02/16/17 1115      Check-In   Location [facility]   Staff Present Aundra Dubin, RN, BSN;Nyjah Denio Luther Parody, BS, EP, Exercise Physiologist   Supervising physician immediately available to respond to emergencies See telemetry face sheet for immediately available MD   Medication changes reported     No   Fall or balance concerns reported    No   Warm-up and Cool-down Performed as group-led instruction   Resistance Training Performed Yes   VAD Patient? No     Pain Assessment   Currently in Pain? No/denies   Pain Score 0-No pain   Multiple Pain Sites No      Capillary Blood Glucose: No results found for this or any previous visit (from the past 24 hour(s)).    History  Smoking Status  . Former Smoker  . Packs/day: 0.25  . Years: 10.00  . Types: Cigarettes  . Start date: 09/20/1966  . Quit date: 09/20/1978  Smokeless Tobacco  . Never Used    Comment: Quit 2008    Goals Met:  Independence with exercise equipment Exercise tolerated well No report of cardiac concerns or symptoms Strength training completed today  Goals Unmet:  Not Applicable  Comments: Check out 1200   Dr. Kate Sable is Medical Director for [place] and Pulmonary Rehab.

## 2017-02-17 NOTE — Progress Notes (Signed)
Cardiac Individual Treatment Plan  Patient Details  Name: [name] MRN: [id number] Date of Birth: [birth year] Referring Provider:     [place] from 10/14/2016 in [place]  Referring Provider  Dr. Harl Bowie      Initial Encounter Date:    CARDIAC REHAB PHASE II ORIENTATION from 10/14/2016 in [place]  Date  10/14/16  Referring Provider  Dr. Harl Bowie      Visit Diagnosis: Chronic systolic CHF (congestive heart failure) ([place])  Patient's Home Medications on Admission:  Current Outpatient Prescriptions:  .  acetaminophen (TYLENOL) 500 MG tablet, Take 1,000 mg by mouth every 6 (six) hours as needed for moderate pain. , Disp: , Rfl:  .  aspirin 81 MG chewable tablet, Chew 1 tablet (81 mg total) by mouth daily., Disp: , Rfl:  .  atorvastatin (LIPITOR) 80 MG tablet, Take 1 tablet (80 mg total) by mouth daily at 6 PM., Disp: 90 tablet, Rfl: 3 .  carvedilol (COREG) 25 MG tablet, Take 1 tablet (25 mg total) by mouth 2 (two) times daily., Disp: 180 tablet, Rfl: 3 .  furosemide (LASIX) 40 MG tablet, Take 1 tablet (40 mg total) by mouth daily., Disp: 30 tablet, Rfl: 6 .  insulin glargine (LANTUS) 100 UNIT/ML injection, Inject 40 Units into the skin 2 (two) times daily., Disp: , Rfl:  .  losartan (COZAAR) 50 MG tablet, TAKE 1 TABLET EVERY DAY, Disp: 90 tablet, Rfl: 3 .  Multiple Vitamin (MULTIVITAMIN) tablet, Take 1 tablet by mouth daily., Disp: , Rfl:  .  NOVOLOG 100 UNIT/ML injection, Inject 10 Units into the skin 3 (three) times daily with meals. , Disp: , Rfl:  .  polyethylene glycol (MIRALAX / GLYCOLAX) packet, Take 17 g by mouth daily., Disp: , Rfl:  .  ranitidine (ZANTAC) 150 MG tablet, Take 150 mg by mouth 2 (two) times daily., Disp: , Rfl:  .  spironolactone (ALDACTONE) 25 MG tablet, Take 1 tablet (25 mg total) by mouth daily., Disp: 90 tablet, Rfl: 3  Past Medical History: Past Medical History:  Diagnosis Date   . Anemia   . Arthritis   . CAD (coronary artery disease)    a. LHC (03/2014) [place]: nl, LAD: diff dz proximal 20%, 30-40% dz mid vessel prior 2nd diagonal, LCx: 40% in OM1, 20-30% in OM2, RCA: 99% subtotal occlusion @ crux, TIMI 1 flow, L-R collaterals to distal vessel  . Chronic systolic heart failure ([place])    a. ECHO (03/2014): EF 25-30%, akinesis enteroanteroseptal myocardium, grade III DD b. RHC (03/2014) RA 4, RV 42/4, PA 44/14 (26), PCWP 21, PA 62% Fick CO/CI 6.9 / 3.4  . Diabetes mellitus   . Duodenal ulcer    Age 66  . Erosive esophagitis   . Gastritis   . Hypertension   . Ischemic cardiomyopathy   . Obese   . Short-term memory loss   . Thyroid disease   . TTP (thrombotic thrombocytopenic purpura) (H[facility])     Tobacco Use: History  Smoking Status  . Former Smoker  . Packs/day: 0.25  . Years: 10.00  . Types: Cigarettes  . Start date: 09/20/1966  . Quit date: 09/20/1978  Smokeless Tobacco  . Never Used    Comment: Quit 2008    Labs: Recent Review Flowsheet Data    Labs for ITP Cardiac and Pulmonary Rehab Latest Ref Rng & Units 03/23/2014 03/24/2014 03/28/2014 03/28/2014 03/28/2014   Hemoglobin A1c <5.7 % - - - - -  PHART 7.350 - 7.450 7.393 - - 7.436 7.413   PCO2ART 35.0 - 45.0 mmHg 35.3 - - 43.8 40.7   HCO3 20.0 - 24.0 mEq/L 21.6 - 29.1(H) 29.4(H) 26.0(H)   TCO2 0 - 100 mmol/L 23 - '30 31 27   '$ ACIDBASEDEF 0.0 - 2.0 mmol/L 3.0(H) - - - -   O2SAT % 94.0 68.5 62.0 84.0 90.0      Capillary Blood Glucose: Lab Results  Component Value Date   GLUCAP 101 (H) 06/14/2015   GLUCAP 83 05/28/2015   GLUCAP 156 (H) 04/15/2014   GLUCAP 107 (H) 04/15/2014   GLUCAP 73 04/14/2014     Exercise Target Goals:    Exercise Program Goal: Individual exercise prescription set with THRR, safety & activity barriers. Participant demonstrates ability to understand and report RPE using BORG scale, to self-measure pulse accurately, and to acknowledge the importance of the exercise  prescription.  Exercise Prescription Goal: Starting with aerobic activity 30 plus minutes a day, 3 days per week for initial exercise prescription. Provide home exercise prescription and guidelines that participant acknowledges understanding prior to discharge.  Activity Barriers & Risk Stratification:     Activity Barriers & Cardiac Risk Stratification - 10/14/16 1501      Activity Barriers & Cardiac Risk Stratification   Activity Barriers None   Cardiac Risk Stratification High      6 Minute Walk:     6 Minute Walk    Row Name 10/14/16 1458 02/17/17 1519       6 Minute Walk   Phase Initial Discharge    Distance 700 feet  Patient had to stop and rest for 30 seconds 1000 feet    Distance % Change 0 % 42.86 %    Walk Time 6 minutes 6 minutes    # of Rest Breaks 0 0    MPH 1.3 1.89    METS 2.01 2.45    RPE 15 13    Perceived Dyspnea  13 12    VO2 Peak 4.08 6.28    Symptoms No No    Resting HR 86 bpm 85 bpm    Resting BP 88/62 112/64    Max Ex. HR 98 bpm 103 bpm    Max Ex. BP 120/66 130/80    2 Minute Post BP 94/60 120/68       Oxygen Initial Assessment:   Oxygen Re-Evaluation:   Oxygen Discharge (Final Oxygen Re-Evaluation):   Initial Exercise Prescription:     Initial Exercise Prescription - 10/14/16 1400      Date of Initial Exercise RX and Referring Provider   Date 10/14/16   Referring Provider Dr. Harl Bowie     NuStep   Level 2   SPM 10   Minutes 20   METs 1.6     Arm Ergometer   Level 2   Watts 19   Minutes 15   METs 2.5     Prescription Details   Frequency (times per week) 3   Duration Progress to 30 minutes of continuous aerobic without signs/symptoms of physical distress     Intensity   THRR REST +  30   THRR 40-80% of Max Heartrate 113-126-140   Ratings of Perceived Exertion 11-13   Perceived Dyspnea 0-4     Progression   Progression Continue progressive overload as per policy without signs/symptoms or physical distress.      Resistance Training   Training Prescription Yes   Weight 1   Reps 10-12  Perform Capillary Blood Glucose checks as needed.  Exercise Prescription Changes:      Exercise Prescription Changes    Row Name 10/25/16 1400 11/22/16 1300 12/08/16 1000 12/20/16 1300 01/10/17 1400     Response to Exercise   Blood Pressure (Admit) 100/50 104/64 122/70 98/60 124/68   Blood Pressure (Exercise) 110/66 124/66 126/74 108/66 116/66   Blood Pressure (Exit) 94/60 100/62 114/68 100/62 100/62   Heart Rate (Admit) 84 bpm 82 bpm 87 bpm 85 bpm 87 bpm   Heart Rate (Exercise) 94 bpm 11 bpm 101 bpm 95 bpm 103 bpm   Heart Rate (Exit) 90 bpm 90 bpm 95 bpm 89 bpm 95 bpm   Rating of Perceived Exertion (Exercise) '14 11 11 11 12   '$ Duration Progress to 30 minutes of continuous aerobic without signs/symptoms of physical distress Progress to 30 minutes of  aerobic without signs/symptoms of physical distress Progress to 30 minutes of  aerobic without signs/symptoms of physical distress Progress to 30 minutes of  aerobic without signs/symptoms of physical distress Progress to 30 minutes of  aerobic without signs/symptoms of physical distress   Intensity Rest + 30 THRR unchanged THRR unchanged THRR unchanged THRR unchanged     Progression   Progression Continue progressive overload as per policy without signs/symptoms or physical distress. Continue to progress workloads to maintain intensity without signs/symptoms of physical distress. Continue to progress workloads to maintain intensity without signs/symptoms of physical distress. Continue to progress workloads to maintain intensity without signs/symptoms of physical distress. Continue to progress workloads to maintain intensity without signs/symptoms of physical distress.     Resistance Training   Training Prescription Yes Yes Yes Yes Yes   Weight '1 1 1 1 1   '$ Reps 10-12 10-15 10-15 10-15 10-15     NuStep   Level '2 1 2 2 2   '$ SPM 14 33 '27 25 20   '$ Minutes '15 15 15  15 15   '$ METs 3.71 3.71 3.71 3.72 3.72     Arm Ergometer   Level 1.'5 2 2 '$ 2.5 2.5   Watts '2 4 4 6 18   '$ Minutes '20 20 20 20 20   '$ METs 1.'5 2 2 '$ 2.5 2.5     Home Exercise Plan   Plans to continue exercise at [facility] (comment) Home (comment) Home (comment) Home (comment)   Frequency Add 2 additional days to program exercise sessions. Add 2 additional days to program exercise sessions. Add 2 additional days to program exercise sessions. Add 2 additional days to program exercise sessions. Add 2 additional days to program exercise sessions.     Exercise Review   Progression No Yes Yes Yes Yes   Row Name 02/04/17 1400             Response to Exercise   Blood Pressure (Admit) 150/82       Blood Pressure (Exercise) 124/78       Blood Pressure (Exit) 118/70       Heart Rate (Admit) 78 bpm       Heart Rate (Exercise) 105 bpm       Heart Rate (Exit) 88 bpm       Rating of Perceived Exertion (Exercise) 11       Duration Progress to 30 minutes of  aerobic without signs/symptoms of physical distress       Intensity THRR unchanged         Progression   Progression Continue to progress workloads to maintain intensity without signs/symptoms  of physical distress.         Resistance Training   Training Prescription Yes       Weight 1       Reps 10-15         NuStep   Level 2       SPM 32       Minutes 15       METs 3.72         Arm Ergometer   Level 2.7       Watts 8       Minutes 20       METs 2.7         Home Exercise Plan   Plans to continue exercise at Home (comment)       Frequency Add 2 additional days to program exercise sessions.         Exercise Review   Progression Yes          Exercise Comments:      Exercise Comments    Row Name 10/25/16 1441 11/22/16 1318 12/08/16 1048 12/20/16 1313 01/10/17 1440   Exercise Comments Patient has only recently began CR and will be progressed in time.  Patient is doing well in CR. She has gone down in level on Nustep due to  weakness in knees but has progressed in upper body strength due to teh arm ergometer  Patient is doing well and is progressing slowly  Patient is progressing slowly but well on her machines in CR.  Patient has lacked in attendance but she has increased her watts but not her levels.    [place] Name 02/04/17 1459           Exercise Comments Patient is doing well in CR.           Exercise Goals and Review:   Exercise Goals Re-Evaluation :     Exercise Goals Re-Evaluation    Row Name 11/23/16 1028 12/23/16 0821 01/12/17 1513 02/09/17 1449 02/17/17 1552     Exercise Goal Re-Evaluation   Exercise Goals Review Increase Strenth and Stamina;Increase Physical Activity Increase Physical Activity;Increase Strenth and Stamina  Get around more; get stronger. Increase Physical Activity;Increase Strenth and Stamina  Get around more; get stronger. Increase Physical Activity;Increase Strenth and Stamina  Get around more; get stronger.  Increase Physical Activity;Increase Strenth and Stamina  Get around more; get stronger.    Comments Patient's strength and stamina are increasing. She is able to do more around her home and feels better overall.  After 20 sessions, patient is progressing well with increased strength and stamina. She says this is helping her a lot and she feels better and stronger and is able to do more around the house. After completing 24 sessions, patient has had some progression but has been inconsistent in her attendance impeding her progress. She says she feels stronger and has more energy. After completing 34 sessions, patient has progressed well with increased strength, stamina and activity. She reports having more energy and being able to do a lot more around house now without fatigue. At graduation, patient's strength, stamina, and activity increased. She says she is able to walk further and do more household chores. She feels better overall. Her exit walk test improved by 44%. Patient did  well in the program.   Expected Outcomes Patient will continue to progress.  Patient will complete the program with continued increased strength, stamina, and activity. Patient will complete the program with increased strength, stamina,  and activity. Patient will graduate with continued increased strength, stamina, and activity. Patient wants to join our maintenance program to continue exercising with continued increased strength, stamina, and acitivity.        Discharge Exercise Prescription (Final Exercise Prescription Changes):     Exercise Prescription Changes - 02/04/17 1400      Response to Exercise   Blood Pressure (Admit) 150/82   Blood Pressure (Exercise) 124/78   Blood Pressure (Exit) 118/70   Heart Rate (Admit) 78 bpm   Heart Rate (Exercise) 105 bpm   Heart Rate (Exit) 88 bpm   Rating of Perceived Exertion (Exercise) 11   Duration Progress to 30 minutes of  aerobic without signs/symptoms of physical distress   Intensity THRR unchanged     Progression   Progression Continue to progress workloads to maintain intensity without signs/symptoms of physical distress.     Resistance Training   Training Prescription Yes   Weight 1   Reps 10-15     NuStep   Level 2   SPM 32   Minutes 15   METs 3.72     Arm Ergometer   Level 2.7   Watts 8   Minutes 20   METs 2.7     Home Exercise Plan   Plans to continue exercise at Home (comment)   Frequency Add 2 additional days to program exercise sessions.     Exercise Review   Progression Yes      Nutrition:  Target Goals: Understanding of nutrition guidelines, daily intake of sodium '1500mg'$ , cholesterol '200mg'$ , calories 30% from fat and 7% or less from saturated fats, daily to have 5 or more servings of fruits and vegetables.  Biometrics:     Pre Biometrics - 10/14/16 1501      Pre Biometrics   Height '5\' 5"'$  (1.651 m)   Weight 244 lb 4.3 oz (110.8 kg)   Waist Circumference 47.5 inches   Hip Circumference 50.5 inches    Waist to Hip Ratio 0.94 %   BMI (Calculated) 40.7   Triceps Skinfold 26 mm   % Body Fat 49.7 %   Grip Strength 51.33 kg   Flexibility 0 in   Single Leg Stand 7 seconds         Post Biometrics - 02/17/17 1520       Post  Biometrics   Height '5\' 5"'$  (1.651 m)   Weight 245 lb 13 oz (111.5 kg)   Waist Circumference 46.5 inches   Hip Circumference 50 inches   Waist to Hip Ratio 0.93 %   BMI (Calculated) 41   Triceps Skinfold 26 mm   % Body Fat 49.5 %   Grip Strength 45.86 kg   Flexibility 0 in   Single Leg Stand 13 seconds      Nutrition Therapy Plan and Nutrition Goals:   Nutrition Discharge: Rate Your Plate Scores:     Nutrition Assessments - 02/17/17 1545      MEDFICTS Scores   Pre Score 50   Post Score 12   Score Difference -38      Nutrition Goals Re-Evaluation:   Nutrition Goals Discharge (Final Nutrition Goals Re-Evaluation):   Psychosocial: Target Goals: Acknowledge presence or absence of significant depression and/or stress, maximize coping skills, provide positive support system. Participant is able to verbalize types and ability to use techniques and skills needed for reducing stress and depression.  Initial Review & Psychosocial Screening:     Initial Psych Review & Screening - 10/14/16 1530  Initial Review   Current issues with Current Sleep Concerns     Family Dynamics   Good Support System? Yes     Barriers   Psychosocial barriers to participate in program There are no identifiable barriers or psychosocial needs.     Screening Interventions   Interventions Encouraged to exercise      Quality of Life Scores:     Quality of Life - 02/17/17 1521      Quality of Life Scores   Health/Function Pre 17.1 %   Health/Function Post 27.86 %   Health/Function % Change 62.92 %   Socioeconomic Pre 24.13 %   Socioeconomic Post 29 %   Socioeconomic % Change  20.18 %   Psych/Spiritual Pre 24 %   Psych/Spiritual Post 28.29 %    Psych/Spiritual % Change 17.88 %   Family Pre 23.7 %   Family Post 30 %   Family % Change 26.58 %   GLOBAL Pre 21.03 %   GLOBAL Post 28.45 %   GLOBAL % Change 35.28 %      PHQ-9: Recent Review Flowsheet Data    Depression screen [facility] 2/9 02/17/2017 10/14/2016 07/19/2016 01/30/2015 01/01/2015   Decreased Interest 0 0 0 0 0   Down, Depressed, Hopeless 0 0 0 0 0    PHQ - 2 Score 0 0 0 0 0   Altered sleeping 0 1 - - -   Tired, decreased energy 1 2 - - -   Change in appetite 1 2 - - -   Feeling bad or failure about yourself  0 0 - - -   Trouble concentrating 0 2 - - -   Moving slowly or fidgety/restless 0 0 - - -   Suicidal thoughts 0 0 - - -   PHQ-9 Score 2 7 - - -   Difficult doing work/chores Not difficult at all Somewhat difficult - - -     Interpretation of Total Score  Total Score Depression Severity:  1-4 = Minimal depression, 5-9 = Mild depression, 10-14 = Moderate depression, 15-19 = Moderately severe depression, 20-27 = Severe depression   Psychosocial Evaluation and Intervention:     Psychosocial Evaluation - 02/17/17 1557      Discharge Psychosocial Assessment & Intervention   Comments Patient has no psychosocial issues identified at discharge. Her QOL score improved my 35% and her PHQ-9 score improved by 6.       Psychosocial Re-Evaluation:     Psychosocial Re-Evaluation    Row Name 10/28/16 0932 11/23/16 1029 12/23/16 0823 01/12/17 1515 02/09/17 1451     Psychosocial Re-Evaluation   Current issues with  - None Identified None Identified None Identified None Identified   Comments Patient's QOL score was 21.03 and her PHQ-9 score was 7. She does not feel she is depressed and does not feel she need counseling.  Patient's QOL was 21.03 scoring lower in health function and her PHQ-9 was 7. She says she is not depressed.   -  -  -   Expected Outcomes  -  - Patient's QOL and PHQ-9 scores will improve at discharge with no psychosocial issues identified.  Patient will  have no psychosocial issues identified at discharge.  Patient will have no psychosocial issues identified at discharge.    Interventions Encouraged to attend Cardiac Rehabilitation for the exercise Encouraged to attend Cardiac Rehabilitation for the exercise Encouraged to attend Cardiac Rehabilitation for the exercise Encouraged to attend Cardiac Rehabilitation for the exercise Encouraged to attend  Cardiac Rehabilitation for the exercise   Continue Psychosocial Services  No No Follow up required No Follow up required No Follow up required No Follow up required      Psychosocial Discharge (Final Psychosocial Re-Evaluation):     Psychosocial Re-Evaluation - 02/09/17 1451      Psychosocial Re-Evaluation   Current issues with None Identified   Expected Outcomes Patient will have no psychosocial issues identified at discharge.    Interventions Encouraged to attend Cardiac Rehabilitation for the exercise   Continue Psychosocial Services  No Follow up required      Vocational Rehabilitation: Provide vocational rehab assistance to qualifying candidates.   Vocational Rehab Evaluation & Intervention:     Vocational Rehab - 10/14/16 1502      Initial Vocational Rehab Evaluation & Intervention   Assessment shows need for Vocational Rehabilitation No      Education: Education Goals: Education classes will be provided on a weekly basis, covering required topics. Participant will state understanding/return demonstration of topics presented.  Learning Barriers/Preferences:     Learning Barriers/Preferences - 10/14/16 1501      Learning Barriers/Preferences   Learning Preferences Pictoral;Video;Written Material      Education Topics: Hypertension, Hypertension Reduction -Define heart disease and high blood pressure. Discus how high blood pressure affects the body and ways to reduce high blood pressure.   CARDIAC REHAB PHASE II EXERCISE from 02/16/2017 in [place]  Date  (P) 10/20/16  Educator  (P) DC  Instruction Review Code  (P) 2- meets goals/outcomes      Exercise and Your Heart -Discuss why it is important to exercise, the FITT principles of exercise, normal and abnormal responses to exercise, and how to exercise safely.   CARDIAC REHAB PHASE II EXERCISE from 02/16/2017 in [place]  Date  (P) 10/27/16  Educator  (P) DC  Instruction Review Code  (P) 2- meets goals/outcomes      Angina -Discuss definition of angina, causes of angina, treatment of angina, and how to decrease risk of having angina.   CARDIAC REHAB PHASE II EXERCISE from 02/16/2017 in [place]  Date  (P) 11/03/16  Educator  (P) DJ  Instruction Review Code  (P) 2- meets goals/outcomes      Cardiac Medications -Review what the following cardiac medications are used for, how they affect the body, and side effects that may occur when taking the medications.  Medications include Aspirin, Beta blockers, calcium channel blockers, ACE Inhibitors, angiotensin receptor blockers, diuretics, digoxin, and antihyperlipidemics.   CARDIAC REHAB PHASE II EXERCISE from 02/16/2017 in [place]  Date  (P) 02/09/17  Educator  (P) DC  Instruction Review Code  (P) 2- meets goals/outcomes      Congestive Heart Failure -Discuss the definition of CHF, how to live with CHF, the signs and symptoms of CHF, and how keep track of weight and sodium intake.   CARDIAC REHAB PHASE II EXERCISE from 02/16/2017 in [place]  Date  (P) 11/17/16  Educator  (P) DC  Instruction Review Code  (P) 2- meets goals/outcomes      Heart Disease and Intimacy -Discus the effect sexual activity has on the heart, how changes occur during intimacy as we age, and safety during sexual activity.   CARDIAC REHAB PHASE II EXERCISE from 02/16/2017 in [place]  Date  (P) 11/24/16  Educator   (P) Dc  Instruction Review Code  (P) 2- meets goals/outcomes  Smoking Cessation / COPD -Discuss different methods to quit smoking, the health benefits of quitting smoking, and the definition of COPD.   Nutrition I: Fats -Discuss the types of cholesterol, what cholesterol does to the heart, and how cholesterol levels can be controlled.   Nutrition II: Labels -Discuss the different components of food labels and how to read food label   CARDIAC REHAB PHASE II EXERCISE from 02/16/2017 in [place]  Date  (P) 12/15/16  Educator  (P) DC  Instruction Review Code  (P) 2- meets goals/outcomes      Heart Parts and Heart Disease -Discuss the anatomy of the heart, the pathway of blood circulation through the heart, and these are affected by heart disease.   Stress I: Signs and Symptoms -Discuss the causes of stress, how stress may lead to anxiety and depression, and ways to limit stress.   Stress II: Relaxation -Discuss different types of relaxation techniques to limit stress.   Warning Signs of Stroke / TIA -Discuss definition of a stroke, what the signs and symptoms are of a stroke, and how to identify when someone is having stroke.   CARDIAC REHAB PHASE II EXERCISE from 02/16/2017 in [place]  Date  (P) 01/12/17  Educator  (P) DC  Instruction Review Code  (P) 2- meets goals/outcomes      Knowledge Questionnaire Score:     Knowledge Questionnaire Score - 02/17/17 1544      Knowledge Questionnaire Score   Pre Score 16/24   Post Score 22/24      Core Components/Risk Factors/Patient Goals at Admission:     Personal Goals and Risk Factors at Admission - 10/14/16 1522      Core Components/Risk Factors/Patient Goals on Admission    Weight Management Yes   Admit Weight 244 lb 8 oz (110.9 kg)   Goal Weight: Short Term 234 lb 8 oz (106.4 kg)   Goal Weight: Long Term 224 lb 8 oz (101.8 kg)   Expected Outcomes Short Term:  Continue to assess and modify interventions until short term weight is achieved;Long Term: Adherence to nutrition and physical activity/exercise program aimed toward attainment of established weight goal   Sedentary Yes   Intervention Provide advice, education, support and counseling about physical activity/exercise needs.;Develop an individualized exercise prescription for aerobic and resistive training based on initial evaluation findings, risk stratification, comorbidities and participant's personal goals.   Expected Outcomes Achievement of increased cardiorespiratory fitness and enhanced flexibility, muscular endurance and strength shown through measurements of functional capacity and personal statement of participant.   Increase Strength and Stamina Yes   Intervention Provide advice, education, support and counseling about physical activity/exercise needs.;Develop an individualized exercise prescription for aerobic and resistive training based on initial evaluation findings, risk stratification, comorbidities and participant's personal goals.   Expected Outcomes Achievement of increased cardiorespiratory fitness and enhanced flexibility, muscular endurance and strength shown through measurements of functional capacity and personal statement of participant.   Diabetes Yes   Intervention Provide education about signs/symptoms and action to take for hypo/hyperglycemia.;Provide education about proper nutrition, including hydration, and aerobic/resistive exercise prescription along with prescribed medications to achieve blood glucose in normal ranges: Fasting glucose 65-99 mg/dL   Expected Outcomes Short Term: Participant verbalizes understanding of the signs/symptoms and immediate care of hyper/hypoglycemia, proper foot care and importance of medication, aerobic/resistive exercise and nutrition plan for blood glucose control.;Long Term: Attainment of HbA1C < 7%.   Heart Failure Yes   Intervention Provide  a  combined exercise and nutrition program that is supplemented with education, support and counseling about heart failure. Directed toward relieving symptoms such as shortness of breath, decreased exercise tolerance, and extremity edema.   Expected Outcomes Long term: Adoption of self-care skills and reduction of barriers for early signs and symptoms recognition and intervention leading to self-care maintenance.;Short term: Attendance in program 2-3 days a week with increased exercise capacity. Reported lower sodium intake. Reported increased fruit and vegetable intake. Reports medication compliance.   Personal Goal Other Yes   Personal Goal Get around more, Get stronger, lose weight   Intervention Attend CR 3xweek and supplement with 2xweek at home   Expected Outcomes Reach personal goals.       Core Components/Risk Factors/Patient Goals Review:      Goals and Risk Factor Review    Row Name 10/14/16 1529 10/28/16 0928 11/23/16 1023 12/23/16 0818 01/12/17 1509     Core Components/Risk Factors/Patient Goals Review   Personal Goals Review Weight Management/Obesity;Sedentary;Increase Strength and Stamina;Heart Failure;Diabetes Weight Management/Obesity;Sedentary;Increase Strength and Stamina;Diabetes  Get around more; Get stronger; lose 50 lbs.  Weight Management/Obesity;Diabetes;Heart Failure  Get around more; get stronger; lose 50 lbs.  Weight Management/Obesity;Diabetes  Lose 50 lbs.  Weight Management/Obesity;Diabetes  Lose 50 lbs.    Review  - Patient has attended 5 sessions. Will continue to monitor for progress.  Patient has completed 13 sessions. She has maintained her weight. She has progressed well in the program. She says she is feeling stronger and feels like doing more at home.  Patient has completed 20 sessions gaining 3.3 lbs. Her reported fasting glucose readings are usually WNL.  Patient has completed 24 sessions gaining 2.2 lbs. Patient has been inconsistent in her attendance  impeding her progress. Her DM is controlled.    Expected Outcomes  - Patient will continue to attend sessions meeting her personal goals.  Patient will complete the program meeting her personal goals.  Patient will complete the program and start meeting her weight loss goal.  Patient will be more consistent in her attendance and complete the program meeting her personal goals.   Charles Mix Name 02/09/17 1446 02/17/17 1546           Core Components/Risk Factors/Patient Goals Review   Personal Goals Review Weight Management/Obesity;Diabetes  Lose 50 lbs.  Weight Management/Obesity;Heart Failure;Diabetes      Review Patient has completed 34 sessions gaining 1.9 lbs. Her DM is controlled and she reports eating healthier. She says she feels much better overall and feels the program has really benefited her. She has not met her weight loss goal but is happy she has not gained weight and hopes she will begin to work toward losing weight.  Patient graduated with 36 sessions maintaining her weight. Patient's exit waist and hip measurements decreased. Her triceps measurements remained the same. She says the program benefited her and she feels better overall since she started.  Her Medficts score improved indicating she is eating healthier.       Expected Outcomes Patient will graduate meeting her personal goals.  Patient plans to join our maintenance program to continue exercising and will start to meet her weight loss goal.          Core Components/Risk Factors/Patient Goals at Discharge (Final Review):      Goals and Risk Factor Review - 02/17/17 1546      Core Components/Risk Factors/Patient Goals Review   Personal Goals Review Weight Management/Obesity;Heart Failure;Diabetes   Review Patient graduated with 36 sessions  maintaining her weight. Patient's exit waist and hip measurements decreased. Her triceps measurements remained the same. She says the program benefited her and she feels better overall since  she started.  Her Medficts score improved indicating she is eating healthier.    Expected Outcomes Patient plans to join our maintenance program to continue exercising and will start to meet her weight loss goal.       ITP Comments:     ITP Comments    Row Name 11/22/16 1503           ITP Comments Patient met with Registered Dietitian to discuss nutrition topics including: Heart healthty eating, heart health cooking and make smart choices when shopping; Portion control; weight management; and hydration. Patient attended a group session with the hospital chaplian called [facility] to discuss and share how her recent cardiac diagnosis has effected her life.          Comments: Patient graduated from [place] today on 02/16/17 after completing 36 sessions. She achieved LTG of 30 minutes of aerobic exercise at Max Met level of 2.7. All patients vitals are WNL. Patient has met with dietician. Discharge instruction has been reviewed in detail and patient stated an understanding of material given. Patient plans to join our maintenance program to continue exercising. Cardiac Rehab staff will make f/u calls at 1 month, 6 months, and 1 year. Patient had no complaints of any abnormal S/S or pain on their exit visit.

## 2017-02-17 NOTE — Progress Notes (Signed)
Discharge Summary  Patient Details  Name: [name] MRN: [id number] Date of Birth: 08-Feb-1951 Referring Provider:     CARDIAC REHAB PHASE II ORIENTATION from 10/14/2016 in [facility] [place]  Referring Provider  Dr. Harl Bowie       Number of Visits: 36  Reason for Discharge:  Patient reached a stable level of exercise. Patient independent in their exercise.  Smoking History:  History  Smoking Status  . Former Smoker  . Packs/day: 0.25  . Years: 10.00  . Types: Cigarettes  . Start date: 09/20/1966  . Quit date: 09/20/1978  Smokeless Tobacco  . Never Used    Comment: Quit 2008    Diagnosis:  Chronic systolic CHF (congestive heart failure) ([place])  ADL [facility]:   Initial Exercise Prescription:     Initial Exercise Prescription - 10/14/16 1400      Date of Initial Exercise RX and Referring Provider   Date 10/14/16   Referring Provider Dr. Harl Bowie     NuStep   Level 2   SPM 10   Minutes 20   METs 1.6     Arm Ergometer   Level 2   Watts 19   Minutes 15   METs 2.5     Prescription Details   Frequency (times per week) 3   Duration Progress to 30 minutes of continuous aerobic without signs/symptoms of physical distress     Intensity   THRR REST +  30   THRR 40-80% of Max Heartrate 113-126-140   Ratings of Perceived Exertion 11-13   Perceived Dyspnea 0-4     Progression   Progression Continue progressive overload as per policy without signs/symptoms or physical distress.     Resistance Training   Training Prescription Yes   Weight 1   Reps 10-12      Discharge Exercise Prescription (Final Exercise Prescription Changes):     Exercise Prescription Changes - 02/04/17 1400      Response to Exercise   Blood Pressure (Admit) 150/82   Blood Pressure (Exercise) 124/78   Blood Pressure (Exit) 118/70   Heart Rate (Admit) 78 bpm   Heart Rate (Exercise) 105 bpm   Heart Rate (Exit) 88 bpm   Rating of Perceived Exertion (Exercise) 11   Duration  Progress to 30 minutes of  aerobic without signs/symptoms of physical distress   Intensity THRR unchanged     Progression   Progression Continue to progress workloads to maintain intensity without signs/symptoms of physical distress.     Resistance Training   Training Prescription Yes   Weight 1   Reps 10-15     NuStep   Level 2   SPM 32   Minutes 15   METs 3.72     Arm Ergometer   Level 2.7   Watts 8   Minutes 20   METs 2.7     Home Exercise Plan   Plans to continue exercise at Home (comment)   Frequency Add 2 additional days to program exercise sessions.     Exercise Review   Progression Yes      Functional Capacity:     6 Minute Walk    Row Name 10/14/16 1458 02/17/17 1519       6 Minute Walk   Phase Initial Discharge    Distance 700 feet  Patient had to stop and rest for 30 seconds 1000 feet    Distance % Change 0 % 42.86 %    Walk Time 6 minutes 6 minutes    #  of Rest Breaks 0 0    MPH 1.3 1.89    METS 2.01 2.45    RPE 15 13    Perceived Dyspnea  13 12    VO2 Peak 4.08 6.28    Symptoms No No    Resting HR 86 bpm 85 bpm    Resting BP 88/62 112/64    Max Ex. HR 98 bpm 103 bpm    Max Ex. BP 120/66 130/80    2 Minute Post BP 94/60 120/68       Psychological, QOL, Others - Outcomes: PHQ 2/9: Depression screen [facility] 2/9 02/17/2017 10/14/2016 07/19/2016 01/30/2015 01/01/2015  Decreased Interest 0 0 0 0 0  Down, Depressed, Hopeless 0 0 0 0 0  PHQ - 2 Score 0 0 0 0 0  Altered sleeping 0 1 - - -  Tired, decreased energy 1 2 - - -  Change in appetite 1 2 - - -  Feeling bad or failure about yourself  0 0 - - -  Trouble concentrating 0 2 - - -  Moving slowly or fidgety/restless 0 0 - - -  Suicidal thoughts 0 0 - - -  PHQ-9 Score 2 7 - - -  Difficult doing work/chores Not difficult at all Somewhat difficult - - -    Quality of Life:     Quality of Life - 02/17/17 1521      Quality of Life Scores   Health/Function Pre 17.1 %   Health/Function Post  27.86 %   Health/Function % Change 62.92 %   Socioeconomic Pre 24.13 %   Socioeconomic Post 29 %   Socioeconomic % Change  20.18 %   Psych/Spiritual Pre 24 %   Psych/Spiritual Post 28.29 %   Psych/Spiritual % Change 17.88 %   Family Pre 23.7 %   Family Post 30 %   Family % Change 26.58 %   GLOBAL Pre 21.03 %   GLOBAL Post 28.45 %   GLOBAL % Change 35.28 %      Personal Goals: Goals established at orientation with interventions provided to work toward goal.     Personal Goals and Risk Factors at Admission - 10/14/16 1522      Core Components/Risk Factors/Patient Goals on Admission    Weight Management Yes   Admit Weight 244 lb 8 oz (110.9 kg)   Goal Weight: Short Term 234 lb 8 oz (106.4 kg)   Goal Weight: Long Term 224 lb 8 oz (101.8 kg)   Expected Outcomes Short Term: Continue to assess and modify interventions until short term weight is achieved;Long Term: Adherence to nutrition and physical activity/exercise program aimed toward attainment of established weight goal   Sedentary Yes   Intervention Provide advice, education, support and counseling about physical activity/exercise needs.;Develop an individualized exercise prescription for aerobic and resistive training based on initial evaluation findings, risk stratification, comorbidities and participant's personal goals.   Expected Outcomes Achievement of increased cardiorespiratory fitness and enhanced flexibility, muscular endurance and strength shown through measurements of functional capacity and personal statement of participant.   Increase Strength and Stamina Yes   Intervention Provide advice, education, support and counseling about physical activity/exercise needs.;Develop an individualized exercise prescription for aerobic and resistive training based on initial evaluation findings, risk stratification, comorbidities and participant's personal goals.   Expected Outcomes Achievement of increased cardiorespiratory fitness  and enhanced flexibility, muscular endurance and strength shown through measurements of functional capacity and personal statement of participant.   Diabetes Yes  Intervention Provide education about signs/symptoms and action to take for hypo/hyperglycemia.;Provide education about proper nutrition, including hydration, and aerobic/resistive exercise prescription along with prescribed medications to achieve blood glucose in normal ranges: Fasting glucose 65-99 mg/dL   Expected Outcomes Short Term: Participant verbalizes understanding of the signs/symptoms and immediate care of hyper/hypoglycemia, proper foot care and importance of medication, aerobic/resistive exercise and nutrition plan for blood glucose control.;Long Term: Attainment of HbA1C < 7%.   Heart Failure Yes   Intervention Provide a combined exercise and nutrition program that is supplemented with education, support and counseling about heart failure. Directed toward relieving symptoms such as shortness of breath, decreased exercise tolerance, and extremity edema.   Expected Outcomes Long term: Adoption of self-care skills and reduction of barriers for early signs and symptoms recognition and intervention leading to self-care maintenance.;Short term: Attendance in program 2-3 days a week with increased exercise capacity. Reported lower sodium intake. Reported increased fruit and vegetable intake. Reports medication compliance.   Personal Goal Other Yes   Personal Goal Get around more, Get stronger, lose weight   Intervention Attend CR 3xweek and supplement with 2xweek at home   Expected Outcomes Reach personal goals.        Personal Goals Discharge:     Goals and Risk Factor Review    Row Name 10/14/16 1529 10/28/16 7824 11/23/16 1023 12/23/16 0818 01/12/17 1509     Core Components/Risk Factors/Patient Goals Review   Personal Goals Review Weight Management/Obesity;Sedentary;Increase Strength and Stamina;Heart Failure;Diabetes Weight  Management/Obesity;Sedentary;Increase Strength and Stamina;Diabetes  Get around more; Get stronger; lose 50 lbs.  Weight Management/Obesity;Diabetes;Heart Failure  Get around more; get stronger; lose 50 lbs.  Weight Management/Obesity;Diabetes  Lose 50 lbs.  Weight Management/Obesity;Diabetes  Lose 50 lbs.    Review  - Patient has attended 5 sessions. Will continue to monitor for progress.  Patient has completed 13 sessions. She has maintained her weight. She has progressed well in the program. She says she is feeling stronger and feels like doing more at home.  Patient has completed 20 sessions gaining 3.3 lbs. Her reported fasting glucose readings are usually WNL.  Patient has completed 24 sessions gaining 2.2 lbs. Patient has been inconsistent in her attendance impeding her progress. Her DM is controlled.    Expected Outcomes  - Patient will continue to attend sessions meeting her personal goals.  Patient will complete the program meeting her personal goals.  Patient will complete the program and start meeting her weight loss goal.  Patient will be more consistent in her attendance and complete the program meeting her personal goals.   [place] Name 02/09/17 1446 02/17/17 1546           Core Components/Risk Factors/Patient Goals Review   Personal Goals Review Weight Management/Obesity;Diabetes  Lose 50 lbs.  Weight Management/Obesity;Heart Failure;Diabetes      Review Patient has completed 34 sessions gaining 1.9 lbs. Her DM is controlled and she reports eating healthier. She says she feels much better overall and feels the program has really benefited her. She has not met her weight loss goal but is happy she has not gained weight and hopes she will begin to work toward losing weight.  Patient graduated with 36 sessions maintaining her weight. Patient's exit waist and hip measurements decreased. Her triceps measurements remained the same. She says the program benefited her and she feels better overall  since she started.  Her Medficts score improved indicating she is eating healthier.       Expected  Outcomes Patient will graduate meeting her personal goals.  Patient plans to join our maintenance program to continue exercising and will start to meet her weight loss goal.          Nutrition & Weight - Outcomes:     Pre Biometrics - 10/14/16 1501      Pre Biometrics   Height 5' 5"  (1.651 m)   Weight 244 lb 4.3 oz (110.8 kg)   Waist Circumference 47.5 inches   Hip Circumference 50.5 inches   Waist to Hip Ratio 0.94 %   BMI (Calculated) 40.7   Triceps Skinfold 26 mm   % Body Fat 49.7 %   Grip Strength 51.33 kg   Flexibility 0 in   Single Leg Stand 7 seconds         Post Biometrics - 02/17/17 1520       Post  Biometrics   Height 5' 5"  (1.651 m)   Weight 245 lb 13 oz (111.5 kg)   Waist Circumference 46.5 inches   Hip Circumference 50 inches   Waist to Hip Ratio 0.93 %   BMI (Calculated) 41   Triceps Skinfold 26 mm   % Body Fat 49.5 %   Grip Strength 45.86 kg   Flexibility 0 in   Single Leg Stand 13 seconds      Nutrition:   Nutrition Discharge:     Nutrition Assessments - 02/17/17 1545      MEDFICTS Scores   Pre Score 50   Post Score 12   Score Difference -38      Education Questionnaire Score:     Knowledge Questionnaire Score - 02/17/17 1544      Knowledge Questionnaire Score   Pre Score 16/24   Post Score 22/24      Goals reviewed with patient; copy given to patient.

## 2017-02-21 ENCOUNTER — Other Ambulatory Visit: Payer: Self-pay | Admitting: Cardiology

## 2017-03-30 ENCOUNTER — Other Ambulatory Visit: Payer: Self-pay | Admitting: Cardiology

## 2017-05-09 ENCOUNTER — Other Ambulatory Visit: Payer: Self-pay | Admitting: Cardiology

## 2017-05-27 ENCOUNTER — Other Ambulatory Visit: Payer: Self-pay | Admitting: Cardiology

## 2017-05-31 ENCOUNTER — Ambulatory Visit: Payer: Medicare HMO | Admitting: Cardiology

## 2017-06-22 ENCOUNTER — Ambulatory Visit (INDEPENDENT_AMBULATORY_CARE_PROVIDER_SITE_OTHER): Payer: Medicare HMO | Admitting: Cardiology

## 2017-06-22 ENCOUNTER — Encounter: Payer: Self-pay | Admitting: Cardiology

## 2017-06-22 VITALS — BP 118/73 | HR 80 | Ht 65.0 in | Wt 241.0 lb

## 2017-06-22 DIAGNOSIS — R5383 Other fatigue: Secondary | ICD-10-CM

## 2017-06-22 DIAGNOSIS — I5042 Chronic combined systolic (congestive) and diastolic (congestive) heart failure: Secondary | ICD-10-CM

## 2017-06-22 DIAGNOSIS — Z23 Encounter for immunization: Secondary | ICD-10-CM | POA: Diagnosis not present

## 2017-06-22 DIAGNOSIS — E1140 Type 2 diabetes mellitus with diabetic neuropathy, unspecified: Secondary | ICD-10-CM

## 2017-06-22 DIAGNOSIS — E782 Mixed hyperlipidemia: Secondary | ICD-10-CM | POA: Diagnosis not present

## 2017-06-22 DIAGNOSIS — I10 Essential (primary) hypertension: Secondary | ICD-10-CM

## 2017-06-22 NOTE — Progress Notes (Signed)
Clinical Summary Ms. Girten is a 66 y.o.female seen today for follow up of the following meidcal problems.   1. Chronic combined systolicand diastolic heart failure - echo 03/2014 LVEF 25-30%, restrictive diastolic dysfunction. New diagnosis at that time. Repeat echo 06/2014 LVEF 40-45% - cath 03/2014 showed RCA 99% with left to right collaterals, other arteries patent. Overall LV systolic dysfunction out of proportion to CAD. Medically managed.  - 07/2015 echo LVEF 67%, grade I diastolic dysfunction - Entresto causes dizziness and nausea, stopped taking. Back on losartan     - completed cardiac rehab - SOB with moderate exertion that is stable. Not weighing at home. Limiting sodium intake. Avoiding NSAIDs - compliant with meds.    2. CAD - cath 03/2014 with 99% chronic RCA disease, otherwise patent vessels. Managed medically  - can have some chest pressure with laying down. Has stopped taking zantac  3. OSA  - severe OSA by sleep study 04/2016 - she reports issues getting her CPAP, has not gotten order yet.   4. Hyperlipidemia 02/2016: TC 84 TG 79 HDL 40 LDL 28 - she remains compliant with statin  Past Medical History:  Diagnosis Date  . Anemia   . Arthritis   . CAD (coronary artery disease)    a. LHC (03/2014) Lmain: nl, LAD: diff dz proximal 20%, 30-40% dz mid vessel prior 2nd diagonal, LCx: 40% in OM1, 20-30% in OM2, RCA: 99% subtotal occlusion @ crux, TIMI 1 flow, L-R collaterals to distal vessel  . Chronic systolic heart failure ([place])    a. ECHO (03/2014): EF 25-30%, akinesis enteroanteroseptal myocardium, grade III DD b. RHC (03/2014) RA 4, RV 42/4, PA 44/14 (26), PCWP 21, PA 62% Fick CO/CI 6.9 / 3.4  . Diabetes mellitus   . Duodenal ulcer    Age 68  . Erosive esophagitis   . Gastritis   . Hypertension   . Ischemic cardiomyopathy   . Obese   . Short-term memory loss   . Thyroid disease   . TTP (thrombotic thrombocytopenic purpura) (HCC)       Allergies  Allergen Reactions  . Other Other (See Comments)    FLAGYL, CIPRO, PROTONIX taken at the same time caused patient to lose her breath and have trouble breathing, requiring hospital stay at [facility], 03/23/14-per patient.   . Lisinopril Cough     Current Outpatient Prescriptions  Medication Sig Dispense Refill  . acetaminophen (TYLENOL) 500 MG tablet Take 1,000 mg by mouth every 6 (six) hours as needed for moderate pain.     Marland Kitchen aspirin 81 MG chewable tablet Chew 1 tablet (81 mg total) by mouth daily.    Marland Kitchen atorvastatin (LIPITOR) 80 MG tablet TAKE 1 TABLET EVERY DAY AT 6PM 90 tablet 3  . carvedilol (COREG) 25 MG tablet TAKE 1 TABLET TWICE DAILY  (DOSE  INCREASE  03/04/16) 180 tablet 3  . furosemide (LASIX) 40 MG tablet TAKE 1 TABLET DAILY. 90 tablet 3  . insulin glargine (LANTUS) 100 UNIT/ML injection Inject 40 Units into the skin 2 (two) times daily.    Marland Kitchen losartan (COZAAR) 50 MG tablet TAKE 1 TABLET EVERY DAY 90 tablet 3  . Multiple Vitamin (MULTIVITAMIN) tablet Take 1 tablet by mouth daily.    Marland Kitchen NOVOLOG 100 UNIT/ML injection Inject 10 Units into the skin 3 (three) times daily with meals.     . polyethylene glycol (MIRALAX / GLYCOLAX) packet Take 17 g by mouth daily.    . ranitidine (ZANTAC) 150 MG  tablet Take 150 mg by mouth 2 (two) times daily.    Marland Kitchen spironolactone (ALDACTONE) 25 MG tablet TAKE 1 TABLET EVERY DAY 90 tablet 1   No current facility-administered medications for this visit.      Past Surgical History:  Procedure Laterality Date  . COLONOSCOPY  08/29/2012   [id number] diverticulosis  . COLONOSCOPY N/A 05/28/2015   Procedure: COLONOSCOPY;  Surgeon: Daneil Dolin, MD;  [facility];  Service: Endoscopy;  Laterality: N/A;  1015  . ECTOPIC PREGNANCY SURGERY    . ESOPHAGOGASTRODUODENOSCOPY  04/20/2012   [id number] antral gastritis/Erosive reflux esophagitis  . LEFT AND RIGHT HEART CATHETERIZATION WITH CORONARY ANGIOGRAM N/A 03/28/2014   Procedure: LEFT AND  RIGHT HEART CATHETERIZATION WITH CORONARY ANGIOGRAM;  Surgeon: Peter M Martinique, MD;  Location: [facility] CATH LAB;  Service: Cardiovascular;  Laterality: N/A;  . SPLENECTOMY, PARTIAL       Allergies  Allergen Reactions  . Other Other (See Comments)    FLAGYL, CIPRO, PROTONIX taken at the same time caused patient to lose her breath and have trouble breathing, requiring hospital stay at [facility], 03/23/14-per patient.   . Lisinopril Cough      Family History  Problem Relation Age of Onset  . Heart failure Mother   . Cirrhosis Father        ETOH  . Diabetes Daughter   . Colon cancer Neg Hx      Social History Ms. Godeaux reports that she quit smoking about 38 years ago. Her smoking use included Cigarettes. She started smoking about 50 years ago. She has a 2.50 pack-year smoking history. She has never used smokeless tobacco. Ms. Tison reports that she does not drink alcohol.   Review of Systems CONSTITUTIONAL: No weight loss, fever, chills, weakness or fatigue.  HEENT: Eyes: No visual loss, blurred vision, double vision or yellow sclerae.No hearing loss, sneezing, congestion, runny nose or sore throat.  SKIN: No rash or itching.  CARDIOVASCULAR: RRR, no m/r/g, no jvd RESPIRATORY: No shortness of breath, cough or sputum.  GASTROINTESTINAL: No anorexia, nausea, vomiting or diarrhea. No abdominal pain or blood.  GENITOURINARY: No burning on urination, no polyuria NEUROLOGICAL: No headache, dizziness, syncope, paralysis, ataxia, numbness or tingling in the extremities. No change in bowel or bladder control.  MUSCULOSKELETAL: No muscle, back pain, joint pain or stiffness.  LYMPHATICS: No enlarged nodes. No history of splenectomy.  PSYCHIATRIC: No history of depression or anxiety.  ENDOCRINOLOGIC: No reports of sweating, cold or heat intolerance. No polyuria or polydipsia.  Marland Kitchen   Physical Examination Vitals:   06/22/17 1057  BP: 118/73  Pulse: 80  SpO2: 97%   Vitals:   06/22/17 1057   Weight: 241 lb (109.3 kg)  Height: 5\' 5"  (1.651 m)    Gen: resting comfortably, no acute distress HEENT: no scleral icterus, pupils equal round and reactive, no palptable cervical adenopathy,  CV: RRR, no m/r/g, no jvd Resp: Clear to auscultation bilaterally GI: abdomen is soft, non-tender, non-distended, normal bowel sounds, no hepatosplenomegaly MSK: extremities are warm, no edema.  Skin: warm, no rash Neuro:  no focal deficits Psych: appropriate affect   Diagnostic Studies  03/2014 echo Study Conclusions  - Left ventricle: The cavity size was normal. There was mild focal basal hypertrophy of the septum. Systolic function was severely reduced. The estimated ejection fraction was in the range of 25% to 30%. There is akinesis of the entireanteroseptal myocardium. Doppler parameters are consistent with a reversible restrictive pattern, indicative of decreased left ventricular  diastolic compliance and/or increased left atrial pressure (grade 3 diastolic dysfunction). - Mitral valve: There was mild regurgitation. - Left atrium: The atrium was moderately dilated. 03/2014 Cath Procedural Findings:  Hemodynamics  RA 8/0 mean 4 mm Hg  RV 42/4 mm Hg  PA 44/14 mean 26 mm Hg  PCWP 20/28 mean 21 mm Hg  LV 98/18 mm Hg  AO 95/54 mean 72 mm Hg  Oxygen saturations:  PA 62%  AO 90%  Cardiac Output (Fick) 6.9 L/min  Cardiac Index (Fick) 3.4 L/min/meter squared  Coronary angiography:  Coronary dominance: right  Left mainstem: Normal  Left anterior descending (LAD): Diffuse disease in the proximal vessel up to 20%. 30-40% disease in the mid vessel prior to the second diagonal.  Left circumflex (LCx): 40% in OM1. 20-30% in OM2. Otherwise no significant disease.  Right coronary artery (RCA): 99% subtotal occlusion at the crux. TIMI 1 flow. Left to right collaterals to the distal vessel.  Left ventriculography: Left ventricular systolic function is abnormal, LVEF is  estimated at 25%. There is inferior akinesis and severe global hypokinesis. There is mild mitral regurgitation  Final Conclusions:  1. Single vessel obstructive CAD with subtotal occlusion of the RCA at the crux.  2. Severe LV dysfunction.  3. Normal Right heart pressures. Elevated PCWP.  Recommendations: Medical management. Her degree of LV dysfunction is out of proportion to her CAD. She has no anginal symptoms so PCI of RCA not indicated. The inferior wall appears scarred.   Assessment and Plan   1. Chronic combined systolic/diastolic HF - NICM, LVEF 75-88% now up to 40-45%, NYHA II. - she is at her baseline, appears euvolemic. Continue current meds  2. CAD - medically managed single vessel RCA disease -no recent symptoms, she will continue current meds  3. OSA  -encourated to contact Dr Luan Pulling office to help resolve her issues getting her cpap   4. Hyperlipidemia -continue statin - we will repeat labs  F/u 6 months      Arnoldo Lenis, M.D

## 2017-06-22 NOTE — Patient Instructions (Addendum)
Medication Instructions:  Continue all current medications.  Labwork:  CMET, TSH, CBC, FLP, HgA1C, Magnesium - orders given today.   Reminder:  Nothing to eat or drink after 12 midnight prior to labs.  Office will contact with results via phone or letter.    Testing/Procedures: none  Follow-Up: Your physician wants you to follow up in: 6 months.  You will receive a reminder letter in the mail one-two months in advance.  If you don't receive a letter, please call our office to schedule the follow up appointment   Any Other Special Instructions Will Be Listed Below (If Applicable). Dr. [name] phone:  [phone]  If you need a refill on your cardiac medications before your next appointment, please call your pharmacy.

## 2017-07-12 ENCOUNTER — Other Ambulatory Visit: Payer: Self-pay | Admitting: Cardiology

## 2017-07-13 ENCOUNTER — Ambulatory Visit: Payer: Medicare HMO | Admitting: Cardiology

## 2017-07-17 ENCOUNTER — Emergency Department (HOSPITAL_COMMUNITY): Payer: Medicare HMO

## 2017-07-17 ENCOUNTER — Emergency Department (HOSPITAL_COMMUNITY)
Admission: EM | Admit: 2017-07-17 | Discharge: 2017-07-17 | Disposition: A | Discharge status: Home / Self Care | Payer: Medicare HMO | Attending: Emergency Medicine | Admitting: Emergency Medicine

## 2017-07-17 ENCOUNTER — Encounter (HOSPITAL_COMMUNITY): Payer: Self-pay | Admitting: Emergency Medicine

## 2017-07-17 DIAGNOSIS — M5441 Lumbago with sciatica, right side: Secondary | ICD-10-CM | POA: Diagnosis not present

## 2017-07-17 DIAGNOSIS — I5022 Chronic systolic (congestive) heart failure: Secondary | ICD-10-CM | POA: Insufficient documentation

## 2017-07-17 DIAGNOSIS — Z7982 Long term (current) use of aspirin: Secondary | ICD-10-CM | POA: Diagnosis not present

## 2017-07-17 DIAGNOSIS — M5442 Lumbago with sciatica, left side: Secondary | ICD-10-CM | POA: Diagnosis not present

## 2017-07-17 DIAGNOSIS — E119 Type 2 diabetes mellitus without complications: Secondary | ICD-10-CM | POA: Diagnosis not present

## 2017-07-17 DIAGNOSIS — M545 Low back pain: Secondary | ICD-10-CM

## 2017-07-17 DIAGNOSIS — Z79899 Other long term (current) drug therapy: Secondary | ICD-10-CM | POA: Diagnosis not present

## 2017-07-17 DIAGNOSIS — I2510 Atherosclerotic heart disease of native coronary artery without angina pectoris: Secondary | ICD-10-CM | POA: Insufficient documentation

## 2017-07-17 DIAGNOSIS — M4696 Unspecified inflammatory spondylopathy, lumbar region: Secondary | ICD-10-CM | POA: Insufficient documentation

## 2017-07-17 DIAGNOSIS — I110 Hypertensive heart disease with heart failure: Secondary | ICD-10-CM | POA: Insufficient documentation

## 2017-07-17 DIAGNOSIS — M47816 Spondylosis without myelopathy or radiculopathy, lumbar region: Secondary | ICD-10-CM

## 2017-07-17 MED ORDER — MORPHINE SULFATE (PF) 10 MG/ML IV SOLN
10.0000 mg | Freq: Once | INTRAVENOUS | Status: AC
  Administered 2017-07-17: 10 mg via INTRAMUSCULAR
  Filled 2017-07-17: qty 1

## 2017-07-17 MED ORDER — HYDROCODONE-ACETAMINOPHEN 5-325 MG PO TABS: 1.0000 | ORAL_TABLET | ORAL | 0 refills | Status: DC | PRN

## 2017-07-17 MED ORDER — ONDANSETRON HCL 4 MG PO TABS
4.0000 mg | ORAL_TABLET | Freq: Once | ORAL | Status: AC
  Administered 2017-07-17: 4 mg via ORAL
  Filled 2017-07-17: qty 1

## 2017-07-17 MED ORDER — METHOCARBAMOL 500 MG PO TABS
500.0000 mg | ORAL_TABLET | Freq: Once | ORAL | Status: AC
  Administered 2017-07-17: 500 mg via ORAL
  Filled 2017-07-17: qty 1

## 2017-07-17 NOTE — ED Triage Notes (Signed)
Onset on Friday pt woke up with back pain, has been resting and using OTC medication without relief

## 2017-07-17 NOTE — ED Provider Notes (Signed)
[facility] EMERGENCY DEPARTMENT Provider Note   CSN: [id number] Arrival date & time: 07/17/17  1651     History   Chief Complaint Chief Complaint  Patient presents with  . Back Pain    HPI [name] is a 66 y.o. female.  Patient is a 66 year old female who presents to the emergency department with complaint of lower back pain the patient denies any  The patient states this problem started on Friday, October 26.  She denies any recent fall.  Is been no new injury reported.  Patient states she has a history of arthritis, but her arthritis has never hurt her quite like this.  She denies any loss of bowel or bladder function.  She has not had frequent falls.  She has not had any unusual numbness or tingling in the saddle area.      Past Medical History:  Diagnosis Date  . Anemia   . Arthritis   . CAD (coronary artery disease)    a. LHC (03/2014) Lmain: nl, LAD: diff dz proximal 20%, 30-40% dz mid vessel prior 2nd diagonal, LCx: 40% in OM1, 20-30% in OM2, RCA: 99% subtotal occlusion @ crux, TIMI 1 flow, L-R collaterals to distal vessel  . Chronic systolic heart failure ([place])    a. ECHO (03/2014): EF 25-30%, akinesis enteroanteroseptal myocardium, grade III DD b. RHC (03/2014) RA 4, RV 42/4, PA 44/14 (26), PCWP 21, PA 62% Fick CO/CI 6.9 / 3.4  . Diabetes mellitus   . Duodenal ulcer    Age 50  . Erosive esophagitis   . Gastritis   . Hypertension   . Ischemic cardiomyopathy   . Obese   . Short-term memory loss   . Thyroid disease   . TTP (thrombotic thrombocytopenic purpura) [facility])     Patient Active Problem List   Diagnosis Date Noted  . Abnormal CT scan   . Diverticulosis of colon without hemorrhage   . Snoring 07/10/2014  . Abnormal LFTs 06/20/2014  . Abnormal CT scan, sigmoid colon 06/20/2014  . Nausea & vomiting 04/13/2014  . Cardiomyopathy, ischemic 04/04/2014  . HTN (hypertension) 04/04/2014  . Chronic combined systolic and diastolic CHF (congestive heart  failure) ([place]) 04/04/2014  . Coronary artery disease 03/31/2014  . Type 2 diabetes mellitus with diabetic neuropathy ([place]) 03/31/2014  . DCM (dilated cardiomyopathy) ([place]) 03/29/2014  . Anemia 03/26/2014  . Diverticulitis large intestine 03/14/2014  . Gastroparesis 02/14/2014  . Uncontrolled diabetes mellitus ([place]) 02/13/2014  . Chronic constipation 11/30/2012  . GERD (gastroesophageal reflux disease) 11/30/2012  . Fatty liver 11/30/2012    Past Surgical History:  Procedure Laterality Date  . COLONOSCOPY  08/29/2012   [id number] diverticulosis  . COLONOSCOPY N/A 05/28/2015   Procedure: COLONOSCOPY;  Surgeon: Daneil Dolin, MD;  [facility];  Service: Endoscopy;  Laterality: N/A;  1015  . ECTOPIC PREGNANCY SURGERY    . ESOPHAGOGASTRODUODENOSCOPY  04/20/2012   [id number] antral gastritis/Erosive reflux esophagitis  . LEFT AND RIGHT HEART CATHETERIZATION WITH CORONARY ANGIOGRAM N/A 03/28/2014   Procedure: LEFT AND RIGHT HEART CATHETERIZATION WITH CORONARY ANGIOGRAM;  Surgeon: Peter M Martinique, MD;  Location: [facility] CATH LAB;  Service: Cardiovascular;  Laterality: N/A;  . SPLENECTOMY, PARTIAL      OB History    No data available       Home Medications    Prior to Admission medications   Medication Sig Start Date End Date Taking? Authorizing Provider  acetaminophen (TYLENOL) 500 MG tablet Take 1,000 mg by mouth  every 6 (six) hours as needed for moderate pain.     [provider]  aspirin 81 MG chewable tablet Chew 1 tablet (81 mg total) by mouth daily. 03/31/14   Charlynne Cousins, MD  atorvastatin (LIPITOR) 80 MG tablet TAKE 1 TABLET EVERY DAY AT 6PM 05/09/17   Arnoldo Lenis, MD  carvedilol (COREG) 25 MG tablet TAKE 1 TABLET TWICE DAILY  (DOSE  INCREASE  03/04/16) 03/30/17   Arnoldo Lenis, MD  furosemide (LASIX) 40 MG tablet TAKE 1 TABLET DAILY. 05/27/17   Arnoldo Lenis, MD  insulin glargine (LANTUS) 100 UNIT/ML injection Inject 40 Units into the skin 2  (two) times daily.    [provider]  losartan (COZAAR) 50 MG tablet TAKE 1 TABLET EVERY DAY 01/31/17   Arnoldo Lenis, MD  Multiple Vitamin (MULTIVITAMIN) tablet Take 1 tablet by mouth daily.    [provider]  NOVOLOG 100 UNIT/ML injection Inject 10 Units into the skin 3 (three) times daily with meals.  03/15/14   [provider]  polyethylene glycol (MIRALAX / GLYCOLAX) packet Take 17 g by mouth daily.    [provider]  ranitidine (ZANTAC) 150 MG tablet Take 150 mg by mouth 2 (two) times daily.    [provider]  spironolactone (ALDACTONE) 25 MG tablet TAKE 1 TABLET EVERY DAY 07/12/17   Branch, Alphonse Guild, MD    Family History Family History  Problem Relation Age of Onset  . Heart failure Mother   . Cirrhosis Father        ETOH  . Diabetes Daughter   . Colon cancer Neg Hx     Social History Social History  Substance Use Topics  . Smoking status: Former Smoker    Packs/day: 0.25    Years: 10.00    Types: Cigarettes    Start date: 09/20/1966    Quit date: 09/20/1978  . Smokeless tobacco: Never Used     Comment: Quit 2008  . Alcohol use No     Allergies   Other and Lisinopril   Review of Systems Review of Systems  Constitutional: Negative for activity change.       All ROS Neg except as noted in HPI  HENT: Negative for nosebleeds.   Eyes: Negative for photophobia and discharge.  Respiratory: Negative for cough, shortness of breath and wheezing.   Cardiovascular: Negative for chest pain and palpitations.  Gastrointestinal: Negative for abdominal pain and blood in stool.  Genitourinary: Negative for dysuria, frequency and hematuria.  Musculoskeletal: Positive for arthralgias and back pain. Negative for neck pain.  Skin: Negative.   Neurological: Negative for dizziness, seizures and speech difficulty.  Psychiatric/Behavioral: Negative for confusion and hallucinations.     Physical Exam Updated Vital Signs BP 109/68  (BP Location: Left Arm)   Pulse 87   Temp (!) 97.1 F (36.2 C) (Temporal)   Resp 16   Ht 5\' 5"  (1.651 m)   Wt 108.9 kg (240 lb)   SpO2 93%   BMI 39.94 kg/m   Physical Exam  Constitutional: She is oriented to person, place, and time. She appears well-developed and well-nourished.  Non-toxic appearance.  HENT:  Head: Normocephalic.  Right Ear: Tympanic membrane and external ear normal.  Left Ear: Tympanic membrane and external ear normal.  Eyes: Pupils are equal, round, and reactive to light. EOM and lids are normal.  Neck: Normal range of motion. Neck supple. Carotid bruit is not present.  Cardiovascular: Normal rate, regular rhythm,  normal heart sounds, intact distal pulses and normal pulses.   Pulmonary/Chest: Breath sounds normal. No respiratory distress.  Abdominal: Soft. Bowel sounds are normal. There is no tenderness. There is no guarding.  Musculoskeletal: Normal range of motion.       Lumbar back: She exhibits pain and spasm.       Back:  Lower back pain with change of position and with attempted leg raise.  Lymphadenopathy:       Head (right side): No submandibular adenopathy present.       Head (left side): No submandibular adenopathy present.    She has no cervical adenopathy.  Neurological: She is alert and oriented to person, place, and time. She has normal strength. No cranial nerve deficit or sensory deficit. Coordination normal.  No motor or sensory deficit.  Skin: Skin is warm and dry.  Psychiatric: She has a normal mood and affect. Her speech is normal.  Nursing note and vitals reviewed.    ED Treatments / Results  Labs (all labs ordered are listed, but only abnormal results are displayed) Labs Reviewed - No data to display  EKG  EKG Interpretation None       Radiology No results found.  Procedures Procedures (including critical care time)  Medications Ordered in ED Medications - No data to display   Initial Impression / Assessment and  Plan / ED Course  I have reviewed the triage vital signs and the nursing notes.  Pertinent labs & imaging results that were available during my care of the patient were reviewed by me and considered in my medical decision making (see chart for details).      Final Clinical Impressions(s) / ED Diagnoses MDM Vital signs within normal limits.  No gross neurologic deficits appreciated.  Patient has a history of avascular necrosis.  Patient is 66 years old we will check for occult fracture or compression fracture.  We will also check for signs of degenerative disc disease. X-ray of the lumbar spine is negative for fracture or traumatic malalignment.  There are degenerative changes at the lower thoracic spine.  Lower lumbar facet degenerative changes also noted.  Patient feeling some better after intramuscular morphine.  I have asked the patient to see Dr. Luan Pulling for additional evaluation and management of her back pain.  Prescription for Norco given to the patient.  Patient will use Tylenol extra strength for mild pain.   Final diagnoses:  Facet arthritis, degenerative, lumbar spine  Bilateral low back pain, unspecified chronicity, with sciatica presence unspecified    New Prescriptions New Prescriptions   HYDROCODONE-ACETAMINOPHEN (NORCO/VICODIN) 5-325 MG TABLET    Take 1 tablet by mouth every 4 (four) hours as needed.     Lily Kocher, PA-C 07/17/17 1950    Virgel Manifold, MD 07/19/17 [phone]

## 2017-07-17 NOTE — Discharge Instructions (Signed)
Your x-ray is negative for hidden fracture or compression fracture involving her back.  There is noted arthritis and degenerative changes of the lower portion of your thoracic spine as well as the lumbar spine area.  Please use a heating pad to your lower back.  Use Tylenol extra strength for mild pain.  Use Norco for more severe pain.  Norco may cause drowsiness.  Please use caution when getting up and about.  Please do not drive, operate machinery, or participate in activities requiring concentration when taking this medication.  Please see Dr. Luan Pulling as soon as possible for office visit for assistance with pain management, as well as to continue the workup of your lower back pain.

## 2017-07-22 DIAGNOSIS — I2510 Atherosclerotic heart disease of native coronary artery without angina pectoris: Secondary | ICD-10-CM | POA: Diagnosis not present

## 2017-07-22 DIAGNOSIS — M5432 Sciatica, left side: Secondary | ICD-10-CM | POA: Diagnosis not present

## 2017-07-22 DIAGNOSIS — I10 Essential (primary) hypertension: Secondary | ICD-10-CM | POA: Diagnosis not present

## 2017-07-22 DIAGNOSIS — E1165 Type 2 diabetes mellitus with hyperglycemia: Secondary | ICD-10-CM | POA: Diagnosis not present

## 2017-08-05 ENCOUNTER — Other Ambulatory Visit (HOSPITAL_COMMUNITY): Payer: Self-pay | Admitting: Pulmonary Disease

## 2017-08-05 DIAGNOSIS — M5432 Sciatica, left side: Secondary | ICD-10-CM

## 2017-08-12 ENCOUNTER — Ambulatory Visit (HOSPITAL_COMMUNITY): Payer: Medicare HMO

## 2017-08-18 ENCOUNTER — Ambulatory Visit (HOSPITAL_COMMUNITY)
Admission: RE | Admit: 2017-08-18 | Discharge: 2017-08-18 | Disposition: A | Discharge status: Home / Self Care | Payer: Medicare HMO | Source: Ambulatory Visit | Attending: Pulmonary Disease | Admitting: Pulmonary Disease

## 2017-08-18 DIAGNOSIS — M4697 Unspecified inflammatory spondylopathy, lumbosacral region: Secondary | ICD-10-CM | POA: Insufficient documentation

## 2017-08-18 DIAGNOSIS — M4696 Unspecified inflammatory spondylopathy, lumbar region: Secondary | ICD-10-CM | POA: Insufficient documentation

## 2017-08-18 DIAGNOSIS — M5127 Other intervertebral disc displacement, lumbosacral region: Secondary | ICD-10-CM | POA: Diagnosis not present

## 2017-08-18 DIAGNOSIS — M48061 Spinal stenosis, lumbar region without neurogenic claudication: Secondary | ICD-10-CM | POA: Diagnosis not present

## 2017-08-18 DIAGNOSIS — M5126 Other intervertebral disc displacement, lumbar region: Secondary | ICD-10-CM | POA: Insufficient documentation

## 2017-08-18 DIAGNOSIS — M5432 Sciatica, left side: Secondary | ICD-10-CM | POA: Diagnosis present

## 2017-08-18 DIAGNOSIS — M47816 Spondylosis without myelopathy or radiculopathy, lumbar region: Secondary | ICD-10-CM | POA: Diagnosis not present

## 2017-10-26 ENCOUNTER — Other Ambulatory Visit: Payer: Self-pay | Admitting: Cardiology

## 2017-12-12 DIAGNOSIS — I5042 Chronic combined systolic (congestive) and diastolic (congestive) heart failure: Secondary | ICD-10-CM | POA: Diagnosis not present

## 2017-12-12 DIAGNOSIS — I2510 Atherosclerotic heart disease of native coronary artery without angina pectoris: Secondary | ICD-10-CM | POA: Diagnosis not present

## 2017-12-12 DIAGNOSIS — I110 Hypertensive heart disease with heart failure: Secondary | ICD-10-CM | POA: Diagnosis not present

## 2017-12-12 DIAGNOSIS — E1143 Type 2 diabetes mellitus with diabetic autonomic (poly)neuropathy: Secondary | ICD-10-CM | POA: Diagnosis not present

## 2017-12-14 ENCOUNTER — Other Ambulatory Visit: Payer: Self-pay | Admitting: Cardiology

## 2017-12-27 ENCOUNTER — Other Ambulatory Visit: Payer: Self-pay | Admitting: Cardiology

## 2017-12-29 ENCOUNTER — Ambulatory Visit (INDEPENDENT_AMBULATORY_CARE_PROVIDER_SITE_OTHER): Payer: Medicare HMO | Admitting: Cardiology

## 2017-12-29 ENCOUNTER — Encounter: Payer: Self-pay | Admitting: *Deleted

## 2017-12-29 ENCOUNTER — Telehealth: Payer: Self-pay | Admitting: Cardiology

## 2017-12-29 ENCOUNTER — Encounter: Payer: Self-pay | Admitting: Cardiology

## 2017-12-29 VITALS — BP 107/63 | HR 83 | Ht 65.0 in | Wt 233.0 lb

## 2017-12-29 DIAGNOSIS — I2510 Atherosclerotic heart disease of native coronary artery without angina pectoris: Secondary | ICD-10-CM

## 2017-12-29 DIAGNOSIS — G4733 Obstructive sleep apnea (adult) (pediatric): Secondary | ICD-10-CM | POA: Diagnosis not present

## 2017-12-29 DIAGNOSIS — E782 Mixed hyperlipidemia: Secondary | ICD-10-CM | POA: Diagnosis not present

## 2017-12-29 DIAGNOSIS — I5022 Chronic systolic (congestive) heart failure: Secondary | ICD-10-CM

## 2017-12-29 NOTE — Progress Notes (Signed)
Clinical Summary Ms. Burck is a 67 y.o.female seen today for follow up of the following meidcal problems.   1. Chronic combined systolicand diastolic heart failure - echo 03/2014 LVEF 25-30%, restrictive diastolic dysfunction. New diagnosis at that time. Repeat echo 06/2014 LVEF 40-45% - cath 03/2014 showed RCA 99% with left to right collaterals, other arteries patent. Overall LV systolic dysfunction out of proportion to CAD. Medically managed.  - 07/2015 echo LVEF 27%, grade I diastolic dysfunction - Entresto causes dizziness and nausea, stopped taking. Back on losartan - completed cardiac rehab    - stable SOB with moderate exertion. Recent URI, still recovering - no recent edema - compliant with meds  2. CAD - cath 03/2014 with 99% chronic RCA disease, otherwise patent vessels. Managed medically - no recent chest pain other than recent symptoms with URI  3. OSA  - severe OSA by sleep study 04/2016 - she reports issues getting her CPAP, has not gotten order yet.  - continued difficulty getting cpap.   4. Hyperlipidemia 02/2016: TC 84 TG 79 HDL 40 LDL 28 - compliant with statin    Past Medical History:  Diagnosis Date  . Anemia   . Arthritis   . CAD (coronary artery disease)    a. LHC (03/2014) Lmain: nl, LAD: diff dz proximal 20%, 30-40% dz mid vessel prior 2nd diagonal, LCx: 40% in OM1, 20-30% in OM2, RCA: 99% subtotal occlusion @ crux, TIMI 1 flow, L-R collaterals to distal vessel  . Chronic systolic heart failure ([place])    a. ECHO (03/2014): EF 25-30%, akinesis enteroanteroseptal myocardium, grade III DD b. RHC (03/2014) RA 4, RV 42/4, PA 44/14 (26), PCWP 21, PA 62% Fick CO/CI 6.9 / 3.4  . Diabetes mellitus   . Duodenal ulcer    Age 87  . Erosive esophagitis   . Gastritis   . Hypertension   . Ischemic cardiomyopathy   . Obese   . Short-term memory loss   . Thyroid disease   . TTP (thrombotic thrombocytopenic purpura) (HCC)      Allergies  Allergen  Reactions  . Other Other (See Comments)    FLAGYL, CIPRO, PROTONIX taken at the same time caused patient to lose her breath and have trouble breathing, requiring hospital stay at [facility], 03/23/14-per patient.   . Lisinopril Cough     Current Outpatient Medications  Medication Sig Dispense Refill  . acetaminophen (TYLENOL) 500 MG tablet Take 1,000 mg by mouth every 6 (six) hours as needed for moderate pain.     Marland Kitchen aspirin 81 MG chewable tablet Chew 1 tablet (81 mg total) by mouth daily.    Marland Kitchen atorvastatin (LIPITOR) 80 MG tablet TAKE 1 TABLET EVERY DAY AT 6PM 90 tablet 3  . carvedilol (COREG) 25 MG tablet TAKE 1 TABLET TWICE DAILY  (DOSE  INCREASE  03/04/16) 180 tablet 1  . furosemide (LASIX) 40 MG tablet TAKE 1 TABLET DAILY. 90 tablet 3  . HYDROcodone-acetaminophen (NORCO/VICODIN) 5-325 MG tablet Take 1 tablet by mouth every 4 (four) hours as needed. 12 tablet 0  . insulin glargine (LANTUS) 100 UNIT/ML injection Inject 40 Units into the skin 2 (two) times daily.    Marland Kitchen losartan (COZAAR) 50 MG tablet TAKE 1 TABLET EVERY DAY 90 tablet 1  . Multiple Vitamin (MULTIVITAMIN) tablet Take 1 tablet by mouth daily.    Marland Kitchen NOVOLOG 100 UNIT/ML injection Inject 10 Units into the skin 3 (three) times daily with meals.     . polyethylene glycol (  MIRALAX / GLYCOLAX) packet Take 17 g by mouth daily.    . ranitidine (ZANTAC) 150 MG tablet Take 150 mg by mouth 2 (two) times daily.    Marland Kitchen spironolactone (ALDACTONE) 25 MG tablet TAKE 1 TABLET EVERY DAY 90 tablet 1   No current facility-administered medications for this visit.      Past Surgical History:  Procedure Laterality Date  . COLONOSCOPY  08/29/2012   [id number] diverticulosis  . COLONOSCOPY N/A 05/28/2015   Procedure: COLONOSCOPY;  Surgeon: Daneil Dolin, MD;  [facility];  Service: Endoscopy;  Laterality: N/A;  1015  . ECTOPIC PREGNANCY SURGERY    . ESOPHAGOGASTRODUODENOSCOPY  04/20/2012   [id number] antral gastritis/Erosive reflux esophagitis  .  LEFT AND RIGHT HEART CATHETERIZATION WITH CORONARY ANGIOGRAM N/A 03/28/2014   Procedure: LEFT AND RIGHT HEART CATHETERIZATION WITH CORONARY ANGIOGRAM;  Surgeon: Peter M Martinique, MD;  Location: [facility] CATH LAB;  Service: Cardiovascular;  Laterality: N/A;  . SPLENECTOMY, PARTIAL       Allergies  Allergen Reactions  . Other Other (See Comments)    FLAGYL, CIPRO, PROTONIX taken at the same time caused patient to lose her breath and have trouble breathing, requiring hospital stay at [facility], 03/23/14-per patient.   . Lisinopril Cough      Family History  Problem Relation Age of Onset  . Heart failure Mother   . Cirrhosis Father        ETOH  . Diabetes Daughter   . Colon cancer Neg Hx      Social History Ms. Rosch reports that she quit smoking about 39 years ago. Her smoking use included cigarettes. She started smoking about 51 years ago. She has a 2.50 pack-year smoking history. She has never used smokeless tobacco. Ms. Monaco reports that she does not drink alcohol.   Review of Systems CONSTITUTIONAL: No weight loss, fever, chills, weakness or fatigue.  HEENT: Eyes: No visual loss, blurred vision, double vision or yellow sclerae.No hearing loss, sneezing, congestion, runny nose or sore throat.  SKIN: No rash or itching.  CARDIOVASCULAR: per hpi RESPIRATORY: per hpi GASTROINTESTINAL: No anorexia, nausea, vomiting or diarrhea. No abdominal pain or blood.  GENITOURINARY: No burning on urination, no polyuria NEUROLOGICAL: No headache, dizziness, syncope, paralysis, ataxia, numbness or tingling in the extremities. No change in bowel or bladder control.  MUSCULOSKELETAL: No muscle, back pain, joint pain or stiffness.  LYMPHATICS: No enlarged nodes. No history of splenectomy.  PSYCHIATRIC: No history of depression or anxiety.  ENDOCRINOLOGIC: No reports of sweating, cold or heat intolerance. No polyuria or polydipsia.  Marland Kitchen   Physical Examination Vitals:   12/29/17 1256  BP: 107/63  Pulse: 83    SpO2: 97%   Vitals:   12/29/17 1256  Weight: 233 lb (105.7 kg)  Height: 5\' 5"  (1.651 m)    Gen: resting comfortably, no acute distress HEENT: no scleral icterus, pupils equal round and reactive, no palptable cervical adenopathy,  CV: RRR, no m/r/g, no jvd Resp: Clear to auscultation bilaterally GI: abdomen is soft, non-tender, non-distended, normal bowel sounds, no hepatosplenomegaly MSK: extremities are warm, no edema.  Skin: warm, no rash Neuro:  no focal deficits Psych: appropriate affect   Diagnostic Studies 03/2014 echo Study Conclusions  - Left ventricle: The cavity size was normal. There was mild focal basal hypertrophy of the septum. Systolic function was severely reduced. The estimated ejection fraction was in the range of 25% to 30%. There is akinesis of the entireanteroseptal myocardium. Doppler parameters are consistent with  a reversible restrictive pattern, indicative of decreased left ventricular diastolic compliance and/or increased left atrial pressure (grade 3 diastolic dysfunction). - Mitral valve: There was mild regurgitation. - Left atrium: The atrium was moderately dilated. 03/2014 Cath Procedural Findings:  Hemodynamics  RA 8/0 mean 4 mm Hg  RV 42/4 mm Hg  PA 44/14 mean 26 mm Hg  PCWP 20/28 mean 21 mm Hg  LV 98/18 mm Hg  AO 95/54 mean 72 mm Hg  Oxygen saturations:  PA 62%  AO 90%  Cardiac Output (Fick) 6.9 L/min  Cardiac Index (Fick) 3.4 L/min/meter squared  Coronary angiography:  Coronary dominance: right  Left mainstem: Normal  Left anterior descending (LAD): Diffuse disease in the proximal vessel up to 20%. 30-40% disease in the mid vessel prior to the second diagonal.  Left circumflex (LCx): 40% in OM1. 20-30% in OM2. Otherwise no significant disease.  Right coronary artery (RCA): 99% subtotal occlusion at the crux. TIMI 1 flow. Left to right collaterals to the distal vessel.  Left ventriculography: Left ventricular  systolic function is abnormal, LVEF is estimated at 25%. There is inferior akinesis and severe global hypokinesis. There is mild mitral regurgitation  Final Conclusions:  1. Single vessel obstructive CAD with subtotal occlusion of the RCA at the crux.  2. Severe LV dysfunction.  3. Normal Right heart pressures. Elevated PCWP.  Recommendations: Medical management. Her degree of LV dysfunction is out of proportion to her CAD. She has no anginal symptoms so PCI of RCA not indicated. The inferior wall appears scarred.  07/2015 echo Study Conclusions  - Left ventricle: The cavity size was at the upper limits of   normal. Wall thickness was increased in a pattern of moderate   LVH. The estimated ejection fraction was 40% (similar calculation   per biplane speckle tracking, reduced global longitudinal strain   of -14.3%). There is akinesis of the basalinferolateral and   inferior myocardium. Doppler parameters are consistent with   abnormal left ventricular relaxation (grade 1 diastolic   dysfunction). - Aortic valve: Mildly calcified annulus. Trileaflet. - Mitral valve: Calcified annulus. There was trivial regurgitation. - Left atrium: The atrium was at the upper limits of normal in   size. - Right atrium: Central venous pressure (est): 3 mm Hg. - Atrial septum: A patent foramen ovale cannot be excluded. - Tricuspid valve: There was trivial regurgitation. - Pulmonary arteries: Systolic pressure could not be accurately   estimated. - Pericardium, extracardiac: There was no pericardial effusion.  Impressions:  - Upper normal LV chamber size with moderate LVH and LVEF   approximately 40% as discussed above. There is akinesis of the   basal inferoseptal and inferior myocardium. Grade 1 diastolic   dysfunction. Compared to the previous study from October 2015,   LVEF is in similar range, perhaps slightly reduced. Upper normal   left atrial chamber size. Trivial tricuspid  regurgitation. Cannot   exclude PFO based on limited images.    Assessment and Plan  1. Chronic combined systolic/diastolic HF - nearly 3 years since last echo. We will repeat study, if persistent dysfunction consider additional titration of her CHF meds  2. CAD - medically managed single vessel RCA disease - no symptoms, continue current meds  3. OSA  - asked to contact Dr Luan Pulling, if issues to call us back   4. Hyperlipidemia -request labs from pcp - continue statin.   F/u 6 months        Arnoldo Lenis, M.D.

## 2017-12-29 NOTE — Patient Instructions (Addendum)

## 2017-12-29 NOTE — Telephone Encounter (Signed)
Pre-cert Verification for the following procedure   Echo scheduled for 01-26-2018

## 2018-01-05 ENCOUNTER — Encounter: Payer: Self-pay | Admitting: Cardiology

## 2018-01-26 ENCOUNTER — Ambulatory Visit (INDEPENDENT_AMBULATORY_CARE_PROVIDER_SITE_OTHER): Payer: Medicare HMO

## 2018-01-26 ENCOUNTER — Other Ambulatory Visit: Payer: Self-pay

## 2018-01-26 DIAGNOSIS — I5022 Chronic systolic (congestive) heart failure: Secondary | ICD-10-CM | POA: Diagnosis not present

## 2018-02-10 ENCOUNTER — Telehealth: Payer: Self-pay | Admitting: *Deleted

## 2018-02-10 NOTE — Telephone Encounter (Signed)
-----   Message from Arnoldo Lenis, MD sent at 01/30/2018  2:09 PM EDT ----- Echo shows heart function remains decreased around 35%. Can she see me back in 3 months instead of 6 months, need to continue working with her meds  Zandra Abts MD

## 2018-02-10 NOTE — Telephone Encounter (Signed)
Pt aware and voiced understanding - 3 month f/u scheduled - routed to pcp

## 2018-03-13 ENCOUNTER — Other Ambulatory Visit: Payer: Self-pay | Admitting: Cardiology

## 2018-04-03 ENCOUNTER — Other Ambulatory Visit: Payer: Self-pay | Admitting: Cardiology

## 2018-05-17 ENCOUNTER — Ambulatory Visit: Payer: Medicare HMO | Admitting: Cardiology

## 2018-05-23 ENCOUNTER — Other Ambulatory Visit: Payer: Self-pay | Admitting: Cardiology

## 2018-06-14 ENCOUNTER — Ambulatory Visit: Payer: Medicare HMO | Admitting: Cardiology

## 2018-06-20 DIAGNOSIS — Z0000 Encounter for general adult medical examination without abnormal findings: Secondary | ICD-10-CM | POA: Diagnosis not present

## 2018-06-20 DIAGNOSIS — Z23 Encounter for immunization: Secondary | ICD-10-CM | POA: Diagnosis not present

## 2018-06-21 ENCOUNTER — Other Ambulatory Visit: Payer: Self-pay | Admitting: Cardiology

## 2018-07-12 ENCOUNTER — Ambulatory Visit: Payer: Medicare HMO | Admitting: Cardiology

## 2018-07-21 DIAGNOSIS — D649 Anemia, unspecified: Secondary | ICD-10-CM | POA: Diagnosis not present

## 2018-07-21 DIAGNOSIS — I10 Essential (primary) hypertension: Secondary | ICD-10-CM | POA: Diagnosis not present

## 2018-07-21 DIAGNOSIS — M311 Thrombotic microangiopathy: Secondary | ICD-10-CM | POA: Diagnosis not present

## 2018-07-21 DIAGNOSIS — I5042 Chronic combined systolic (congestive) and diastolic (congestive) heart failure: Secondary | ICD-10-CM | POA: Diagnosis not present

## 2018-07-21 DIAGNOSIS — E0590 Thyrotoxicosis, unspecified without thyrotoxic crisis or storm: Secondary | ICD-10-CM | POA: Diagnosis not present

## 2018-07-21 DIAGNOSIS — I110 Hypertensive heart disease with heart failure: Secondary | ICD-10-CM | POA: Diagnosis not present

## 2018-07-21 DIAGNOSIS — Z0000 Encounter for general adult medical examination without abnormal findings: Secondary | ICD-10-CM | POA: Diagnosis not present

## 2018-07-21 DIAGNOSIS — E1165 Type 2 diabetes mellitus with hyperglycemia: Secondary | ICD-10-CM | POA: Diagnosis not present

## 2018-07-21 DIAGNOSIS — I2510 Atherosclerotic heart disease of native coronary artery without angina pectoris: Secondary | ICD-10-CM | POA: Diagnosis not present

## 2018-07-26 ENCOUNTER — Ambulatory Visit: Payer: Medicare HMO | Admitting: Cardiology

## 2018-08-31 ENCOUNTER — Ambulatory Visit (INDEPENDENT_AMBULATORY_CARE_PROVIDER_SITE_OTHER): Payer: Medicare HMO | Admitting: Cardiology

## 2018-08-31 ENCOUNTER — Encounter: Payer: Self-pay | Admitting: *Deleted

## 2018-08-31 ENCOUNTER — Encounter: Payer: Self-pay | Admitting: Cardiology

## 2018-08-31 VITALS — BP 82/56 | HR 80 | Ht 65.0 in | Wt 238.6 lb

## 2018-08-31 DIAGNOSIS — I5022 Chronic systolic (congestive) heart failure: Secondary | ICD-10-CM

## 2018-08-31 DIAGNOSIS — I959 Hypotension, unspecified: Secondary | ICD-10-CM

## 2018-08-31 DIAGNOSIS — I2510 Atherosclerotic heart disease of native coronary artery without angina pectoris: Secondary | ICD-10-CM | POA: Diagnosis not present

## 2018-08-31 MED ORDER — LOSARTAN POTASSIUM 25 MG PO TABS: 25.0000 mg | ORAL_TABLET | Freq: Every day | ORAL | 1 refills | Status: DC

## 2018-08-31 MED ORDER — FUROSEMIDE 20 MG PO TABS: ORAL_TABLET | ORAL | 1 refills | Status: DC

## 2018-08-31 NOTE — Patient Instructions (Signed)
Your physician recommends that you schedule a follow-up appointment in: [place] has recommended you make the following change in your medication:   HOLD LASIX FOR 3 DAYS THEN TAKE LASIX 20 MG DAILY - MAY TAKE ADDITIONAL 40 MG AS NEEDED FOR SHORTNESS OF BREATH OR SWELLING  DECREASE LOSARTAN 25 MG DAILY   Thank you for choosing [facility]!!

## 2018-08-31 NOTE — Progress Notes (Signed)
Clinical Summary Ms. Dicker is a 67 y.o.female seen today for follow up of the following meidcal problems.   1. Chronic combined systolicand diastolic heart failure - echo 03/2014 LVEF 25-30%, restrictive diastolic dysfunction. New diagnosis at that time. Repeat echo 06/2014 LVEF 40-45% - cath 03/2014 showed RCA 99% with left to right collaterals, other arteries patent. Overall LV systolic dysfunction out of proportion to CAD. Medically managed.  - 07/2015 echo LVEF 69%, grade I diastolic dysfunction - Entresto causes dizziness and nausea, stopped taking. Back on losartan   01/2018 echo LVEF 35%, grade I diastoilc dysfunction - no recent edema. Recent URI symptoms, ongoing cough and SOB.    2. CAD - cath 03/2014 with 99% chronic RCA disease, otherwise patent vessels. Managed medically -denies any chest pain.   3. OSA  - severe OSA by sleep study 04/2016 - she reports issues getting her CPAP, has not gotten order yet. - continued difficulty getting cpap.   4. Hyperlipidemia 02/2016: TC 84 TG 79 HDL 40 LDL 28   5. Low bp - normal po intake - some nausea and vomiting few days after drinking some tea. No diarrhea - no fevers or chills. Nonproductive cough x 1 week, some nasal congestion. - some lightheadedness with standing.  - drinks 2 glasses of water a day, 1 coffee, 1 cup of tea  6. Hypothryoidism - started on synthroid recently Past Medical History:  Diagnosis Date  . Anemia   . Arthritis   . CAD (coronary artery disease)    a. LHC (03/2014) Lmain: nl, LAD: diff dz proximal 20%, 30-40% dz mid vessel prior 2nd diagonal, LCx: 40% in OM1, 20-30% in OM2, RCA: 99% subtotal occlusion @ crux, TIMI 1 flow, L-R collaterals to distal vessel  . Chronic systolic heart failure ([place])    a. ECHO (03/2014): EF 25-30%, akinesis enteroanteroseptal myocardium, grade III DD b. RHC (03/2014) RA 4, RV 42/4, PA 44/14 (26), PCWP 21, PA 62% Fick CO/CI 6.9 / 3.4  . Diabetes mellitus   .  Duodenal ulcer    Age 31  . Erosive esophagitis   . Gastritis   . Hypertension   . Ischemic cardiomyopathy   . Obese   . Short-term memory loss   . Thyroid disease   . TTP (thrombotic thrombocytopenic purpura) (HCC)      Allergies  Allergen Reactions  . Other Other (See Comments)    FLAGYL, CIPRO, PROTONIX taken at the same time caused patient to lose her breath and have trouble breathing, requiring hospital stay at [facility], 03/23/14-per patient.   . Lisinopril Cough     Current Outpatient Medications  Medication Sig Dispense Refill  . acetaminophen (TYLENOL) 500 MG tablet Take 1,000 mg by mouth every 6 (six) hours as needed for moderate pain.     Marland Kitchen aspirin 81 MG chewable tablet Chew 1 tablet (81 mg total) by mouth daily.    Marland Kitchen atorvastatin (LIPITOR) 80 MG tablet TAKE 1 TABLET EVERY DAY AT 6:00 PM 90 tablet 3  . carvedilol (COREG) 25 MG tablet TAKE 1 TABLET TWICE DAILY  (DOSE  INCREASE  03/04/16) 180 tablet 1  . furosemide (LASIX) 40 MG tablet TAKE 1 TABLET EVERY DAY 90 tablet 3  . HYDROcodone-acetaminophen (NORCO/VICODIN) 5-325 MG tablet Take 1 tablet by mouth every 4 (four) hours as needed. 12 tablet 0  . insulin glargine (LANTUS) 100 UNIT/ML injection Inject 40 Units into the skin 2 (two) times daily.    Marland Kitchen losartan (COZAAR) 50  MG tablet TAKE 1 TABLET EVERY DAY 90 tablet 2  . Multiple Vitamin (MULTIVITAMIN) tablet Take 1 tablet by mouth daily.    Marland Kitchen NOVOLOG 100 UNIT/ML injection Inject 10 Units into the skin 3 (three) times daily with meals.     . Omega-3 Fatty Acids (FISH OIL) 1000 MG CPDR Take by mouth.    . polyethylene glycol (MIRALAX / GLYCOLAX) packet Take 17 g by mouth daily.    . ranitidine (ZANTAC) 150 MG tablet Take 150 mg by mouth 2 (two) times daily.    Marland Kitchen spironolactone (ALDACTONE) 25 MG tablet TAKE 1 TABLET EVERY DAY 90 tablet 1   No current facility-administered medications for this visit.      Past Surgical History:  Procedure Laterality Date  . COLONOSCOPY   08/29/2012   [id number] diverticulosis  . COLONOSCOPY N/A 05/28/2015   Procedure: COLONOSCOPY;  Surgeon: Daneil Dolin, MD;  Location[facility];  Service: Endoscopy;  Laterality: N/A;  1015  . ECTOPIC PREGNANCY SURGERY    . ESOPHAGOGASTRODUODENOSCOPY  04/20/2012   [id number] antral gastritis/Erosive reflux esophagitis  . LEFT AND RIGHT HEART CATHETERIZATION WITH CORONARY ANGIOGRAM N/A 03/28/2014   Procedure: LEFT AND RIGHT HEART CATHETERIZATION WITH CORONARY ANGIOGRAM;  Surgeon: Peter M Martinique, MD;  Location: [facility] CATH LAB;  Service: Cardiovascular;  Laterality: N/A;  . SPLENECTOMY, PARTIAL       Allergies  Allergen Reactions  . Other Other (See Comments)    FLAGYL, CIPRO, PROTONIX taken at the same time caused patient to lose her breath and have trouble breathing, requiring hospital stay at [facility], 03/23/14-per patient.   . Lisinopril Cough      Family History  Problem Relation Age of Onset  . Heart failure Mother   . Cirrhosis Father        ETOH  . Diabetes Daughter   . Colon cancer Neg Hx      Social History Ms. Rotz reports that she quit smoking about 39 years ago. Her smoking use included cigarettes. She started smoking about 51 years ago. She has a 2.50 pack-year smoking history. She has never used smokeless tobacco. Ms. Cothran reports no history of alcohol use.   Review of Systems CONSTITUTIONAL: No weight loss, fever, chills, weakness or fatigue.  HEENT: Eyes: No visual loss, blurred vision, double vision or yellow sclerae.No hearing loss, sneezing, congestion, runny nose or sore throat.  SKIN: No rash or itching.  CARDIOVASCULAR: per hpi RESPIRATORY: per hpi GASTROINTESTINAL: No anorexia, nausea, vomiting or diarrhea. No abdominal pain or blood.  GENITOURINARY: No burning on urination, no polyuria NEUROLOGICAL: +dizziness MUSCULOSKELETAL: No muscle, back pain, joint pain or stiffness.  LYMPHATICS: No enlarged nodes. No history of splenectomy.  PSYCHIATRIC: No  history of depression or anxiety.  ENDOCRINOLOGIC: No reports of sweating, cold or heat intolerance. No polyuria or polydipsia.  Marland Kitchen   Physical Examination Vitals:   08/31/18 1103 08/31/18 1112  BP: (!) 84/58 (!) 82/56  Pulse: 80   SpO2: 96%    Vitals:   08/31/18 1103  Weight: 238 lb 9.6 oz (108.2 kg)  Height: 5\' 5"  (1.651 m)    Gen: resting comfortably, no acute distress HEENT: no scleral icterus, pupils equal round and reactive, no palptable cervical adenopathy,  CV: RRR, no m/r/g, no jvd Resp: Clear to auscultation bilaterally GI: abdomen is soft, non-tender, non-distended, normal bowel sounds, no hepatosplenomegaly MSK: extremities are warm, no edema.  Skin: warm, no rash Neuro:  no focal deficits Psych: appropriate affect   Diagnostic  Studies 03/2014 echo Study Conclusions  - Left ventricle: The cavity size was normal. There was mild focal basal hypertrophy of the septum. Systolic function was severely reduced. The estimated ejection fraction was in the range of 25% to 30%. There is akinesis of the entireanteroseptal myocardium. Doppler parameters are consistent with a reversible restrictive pattern, indicative of decreased left ventricular diastolic compliance and/or increased left atrial pressure (grade 3 diastolic dysfunction). - Mitral valve: There was mild regurgitation. - Left atrium: The atrium was moderately dilated. 03/2014 Cath Procedural Findings:  Hemodynamics  RA 8/0 mean 4 mm Hg  RV 42/4 mm Hg  PA 44/14 mean 26 mm Hg  PCWP 20/28 mean 21 mm Hg  LV 98/18 mm Hg  AO 95/54 mean 72 mm Hg  Oxygen saturations:  PA 62%  AO 90%  Cardiac Output (Fick) 6.9 L/min  Cardiac Index (Fick) 3.4 L/min/meter squared  Coronary angiography:  Coronary dominance: right  Left mainstem: Normal  Left anterior descending (LAD): Diffuse disease in the proximal vessel up to 20%. 30-40% disease in the mid vessel prior to the second diagonal.  Left  circumflex (LCx): 40% in OM1. 20-30% in OM2. Otherwise no significant disease.  Right coronary artery (RCA): 99% subtotal occlusion at the crux. TIMI 1 flow. Left to right collaterals to the distal vessel.  Left ventriculography: Left ventricular systolic function is abnormal, LVEF is estimated at 25%. There is inferior akinesis and severe global hypokinesis. There is mild mitral regurgitation  Final Conclusions:  1. Single vessel obstructive CAD with subtotal occlusion of the RCA at the crux.  2. Severe LV dysfunction.  3. Normal Right heart pressures. Elevated PCWP.  Recommendations: Medical management. Her degree of LV dysfunction is out of proportion to her CAD. She has no anginal symptoms so PCI of RCA not indicated. The inferior wall appears scarred.  07/2015 echo Study Conclusions  - Left ventricle: The cavity size was at the upper limits of normal. Wall thickness was increased in a pattern of moderate LVH. The estimated ejection fraction was 40% (similar calculation per biplane speckle tracking, reduced global longitudinal strain of -14.3%). There is akinesis of the basalinferolateral and inferior myocardium. Doppler parameters are consistent with abnormal left ventricular relaxation (grade 1 diastolic dysfunction). - Aortic valve: Mildly calcified annulus. Trileaflet. - Mitral valve: Calcified annulus. There was trivial regurgitation. - Left atrium: The atrium was at the upper limits of normal in size. - Right atrium: Central venous pressure (est): 3 mm Hg. - Atrial septum: A patent foramen ovale cannot be excluded. - Tricuspid valve: There was trivial regurgitation. - Pulmonary arteries: Systolic pressure could not be accurately estimated. - Pericardium, extracardiac: There was no pericardial effusion.  Impressions:  - Upper normal LV chamber size with moderate LVH and LVEF approximately 40% as discussed above. There is akinesis of  the basal inferoseptal and inferior myocardium. Grade 1 diastolic dysfunction. Compared to the previous study from October 2015, LVEF is in similar range, perhaps slightly reduced. Upper normal left atrial chamber size. Trivial tricuspid regurgitation. Cannot exclude PFO based on limited images.   01/2018 echo Study Conclusions  - Left ventricle: The cavity size was normal. Wall thickness was   increased in a pattern of mild LVH. The estimated ejection   fraction was 35%. There is akinesis of the basal-midinferior   myocardium. There is akinesis of the mid-apicalanteroseptal and   apical myocardium. Doppler parameters are consistent with   abnormal left ventricular relaxation (grade 1 diastolic   dysfunction). - Ventricular  septum: Septal motion showed abnormal function and   dyssynergy suggestive of left bundle Daila Elbert block. - Aortic valve: Moderately calcified annulus. Trileaflet. - Mitral valve: There was mild regurgitation. - Right atrium: Central venous pressure (est): 3 mm Hg. - Atrial septum: No defect or patent foramen ovale was identified. - Tricuspid valve: There was trivial regurgitation. - Pulmonary arteries: Systolic pressure could not be accurately   estimated. - Pericardium, extracardiac: There was no pericardial effusion. Assessment and Plan   1. Chronic combined systolic/diastolic HF - medical therapy limited by soft bp's today, manual recheck 90/60  we will lower losartan to 25mg  daily, hold lasix x 3 days then resume at lower dose 20mg  daily but can take 40mg  as needed  2. CAD - medically managed single vessel RCA disease - no recent symptoms, continue current meds  3. Hypotension - mnaul recheck 90/60s - I suspect related to recent URI with decreased intake, some N/V, with ongoing dosing of her CHF meds and diuretic. She has poor oral hydration in general that we discussed, discussed the imprortance of adequate hydration particularly on her CHF  regimen - we will adjust CHF meds as listed above   F/u 2 months     Arnoldo Lenis, M.D.

## 2018-10-05 ENCOUNTER — Other Ambulatory Visit: Payer: Self-pay | Admitting: Cardiology

## 2018-11-14 ENCOUNTER — Ambulatory Visit (INDEPENDENT_AMBULATORY_CARE_PROVIDER_SITE_OTHER): Payer: Medicare HMO | Admitting: Cardiology

## 2018-11-14 ENCOUNTER — Encounter: Payer: Self-pay | Admitting: *Deleted

## 2018-11-14 ENCOUNTER — Encounter: Payer: Self-pay | Admitting: Cardiology

## 2018-11-14 VITALS — BP 105/70 | HR 78 | Ht 65.0 in | Wt 238.0 lb

## 2018-11-14 DIAGNOSIS — I5022 Chronic systolic (congestive) heart failure: Secondary | ICD-10-CM

## 2018-11-14 DIAGNOSIS — I2510 Atherosclerotic heart disease of native coronary artery without angina pectoris: Secondary | ICD-10-CM | POA: Diagnosis not present

## 2018-11-14 DIAGNOSIS — I10 Essential (primary) hypertension: Secondary | ICD-10-CM

## 2018-11-14 DIAGNOSIS — E119 Type 2 diabetes mellitus without complications: Secondary | ICD-10-CM | POA: Diagnosis not present

## 2018-11-14 DIAGNOSIS — E782 Mixed hyperlipidemia: Secondary | ICD-10-CM

## 2018-11-14 NOTE — Patient Instructions (Signed)
Your physician recommends that you schedule a follow-up appointment in: 3 MONTHS WITH DR BRANCH  Your physician recommends that you continue on your current medications as directed. Please refer to the Current Medication list given to you today.  Your physician has requested that you have an echocardiogram. Echocardiography is a painless test that uses sound waves to create images of your heart. It provides your doctor with information about the size and shape of your heart and how well your heart's chambers and valves are working. This procedure takes approximately one hour. There are no restrictions for this procedure.   Thank you for choosing [facility]!!    

## 2018-11-14 NOTE — Progress Notes (Signed)
Clinical Summary [name] is a 68 y.o.female seen today for follow up of the following meidcal problems.   1. Chronic combined systolicand diastolic heart failure - echo 03/2014 LVEF 25-30%, restrictive diastolic dysfunction. New diagnosis at that time. Repeat echo 06/2014 LVEF 40-45% - cath 03/2014 showed RCA 99% with left to right collaterals, other arteries patent. Overall LV systolic dysfunction out of proportion to CAD. Medically managed.  - 07/2015 echo LVEF 64%, grade I diastolic dysfunction  - Entresto causes dizziness and nausea, stopped taking. Back on losartan   01/2018 echo LVEF 35%, grade I diastoilc dysfunction  - last visit due to low bp's we lowered losartan to 25mg  daily. Lowered lasix to 20mg  daily, may take 40mg  as needed. At that time she was also recovering from a URI, decreased oral intake and likely some dehydration.   - no recent SOB/DOE. No recent edema - compliant with meds.      2. CAD - cath 03/2014 with 99% chronic RCA disease, otherwise patent vessels. Managed medically  - no recent chest pain.   3. OSA  - severe OSA by sleep study 04/2016 - she reports issues getting her CPAP, has not gotten order yet. - continued difficulty getting cpap.  - has not been able to arrange f/u with Dr [name].   4. Hyperlipidemia Compliant with statin, last labs with Dr [name]   5. Hypothryoidism - started on synthroid by pcp recently   6. Chronic LBBB     Past Medical History:  Diagnosis Date  . Anemia   . Arthritis   . CAD (coronary artery disease)    a. LHC (03/2014) [place]: nl, LAD: diff dz proximal 20%, 30-40% dz mid vessel prior 2nd diagonal, LCx: 40% in OM1, 20-30% in OM2, RCA: 99% subtotal occlusion @ crux, TIMI 1 flow, L-R collaterals to distal vessel  . Chronic systolic heart failure ([place])    a. ECHO (03/2014): EF 25-30%, akinesis enteroanteroseptal myocardium, grade III DD b. RHC (03/2014) RA 4, RV 42/4, PA 44/14 (26), PCWP 21,  PA 62% Fick CO/CI 6.9 / 3.4  . Diabetes mellitus   . Duodenal ulcer    Age 29  . Erosive esophagitis   . Gastritis   . Hypertension   . Ischemic cardiomyopathy   . Obese   . Short-term memory loss   . Thyroid disease   . TTP (thrombotic thrombocytopenic purpura) (H[facility])      Allergies  Allergen Reactions  . Other Other (See Comments)    FLAGYL, CIPRO, PROTONIX taken at the same time caused patient to lose her breath and have trouble breathing, requiring hospital stay at [facility], 03/23/14-per patient.   . Lisinopril Cough     Current Outpatient Medications  Medication Sig Dispense Refill  . acetaminophen (TYLENOL) 500 MG tablet Take 1,000 mg by mouth every 6 (six) hours as needed for moderate pain.     Marland Kitchen aspirin 81 MG chewable tablet Chew 1 tablet (81 mg total) by mouth daily.    Marland Kitchen atorvastatin (LIPITOR) 80 MG tablet TAKE 1 TABLET EVERY DAY AT 6:00 PM 90 tablet 3  . carvedilol (COREG) 25 MG tablet TAKE 1 TABLET TWICE DAILY  (DOSE  INCREASE  03/04/16) 180 tablet 1  . furosemide (LASIX) 20 MG tablet TAKE 1 TABLET DAILY - MAY TAKE ADDITIONAL 40 MG AS NEEDED 180 tablet 1  . insulin glargine (LANTUS) 100 UNIT/ML injection Inject 40 Units into the skin 2 (two) times daily.    Marland Kitchen levothyroxine (  SYNTHROID, LEVOTHROID) 25 MCG tablet Take 25 mcg by mouth daily before breakfast.    . losartan (COZAAR) 25 MG tablet Take 1 tablet (25 mg total) by mouth daily. 90 tablet 1  . Multiple Vitamin (MULTIVITAMIN) tablet Take 1 tablet by mouth daily.    Marland Kitchen NOVOLOG 100 UNIT/ML injection Inject 10 Units into the skin 3 (three) times daily with meals.     . Omega-3 Fatty Acids (FISH OIL) 1000 MG CPDR Take 1 capsule by mouth daily.     . polyethylene glycol (MIRALAX / GLYCOLAX) packet Take 17 g by mouth daily.    . ranitidine (ZANTAC) 150 MG tablet Take 150 mg by mouth 2 (two) times daily.    Marland Kitchen spironolactone (ALDACTONE) 25 MG tablet TAKE 1 TABLET EVERY DAY 90 tablet 1   No current facility-administered medications  for this visit.      Past Surgical History:  Procedure Laterality Date  . COLONOSCOPY  08/29/2012   [id number] diverticulosis  . COLONOSCOPY N/A 05/28/2015   Procedure: COLONOSCOPY;  Surgeon: Christina Dolin, MD;  Location: [facility];  Service: Endoscopy;  Laterality: N/A;  1015  . ECTOPIC PREGNANCY SURGERY    . ESOPHAGOGASTRODUODENOSCOPY  04/20/2012   [id number] antral gastritis/Erosive reflux esophagitis  . LEFT AND RIGHT HEART CATHETERIZATION WITH CORONARY ANGIOGRAM N/A 03/28/2014   Procedure: LEFT AND RIGHT HEART CATHETERIZATION WITH CORONARY ANGIOGRAM;  Surgeon: Peter M Martinique, MD;  Location: [facility] CATH LAB;  Service: Cardiovascular;  Laterality: N/A;  . SPLENECTOMY, PARTIAL       Allergies  Allergen Reactions  . Other Other (See Comments)    FLAGYL, CIPRO, PROTONIX taken at the same time caused patient to lose her breath and have trouble breathing, requiring hospital stay at [facility], 03/23/14-per patient.   . Lisinopril Cough      Family History  Problem Relation Age of Onset  . Heart failure Mother   . Cirrhosis Father        ETOH  . Diabetes Daughter   . Colon cancer Neg Hx      Social History [name] reports that she quit smoking about 40 years ago. Her smoking use included cigarettes. She started smoking about 52 years ago. She has a 2.50 pack-year smoking history. She has never used smokeless tobacco. [name] reports no history of alcohol use.   Review of Systems CONSTITUTIONAL: +fatigue HEENT: Eyes: No visual loss, blurred vision, double vision or yellow sclerae.No hearing loss, sneezing, congestion, runny nose or sore throat.  SKIN: No rash or itching.  CARDIOVASCULAR: per hpi RESPIRATORY: No shortness of breath, cough or sputum.  GASTROINTESTINAL: No anorexia, nausea, vomiting or diarrhea. No abdominal pain or blood.  GENITOURINARY: No burning on urination, no polyuria NEUROLOGICAL: No headache, dizziness, syncope, paralysis, ataxia, numbness or tingling in  the extremities. No change in bowel or bladder control.  MUSCULOSKELETAL: No muscle, back pain, joint pain or stiffness.  LYMPHATICS: No enlarged nodes. No history of splenectomy.  PSYCHIATRIC: No history of depression or anxiety.  ENDOCRINOLOGIC: No reports of sweating, cold or heat intolerance. No polyuria or polydipsia.  Marland Kitchen   Physical Examination Vitals:   11/14/18 1013  BP: 105/70  Pulse: 78  SpO2: 97%   Vitals:   11/14/18 1013  Weight: 238 lb (108 kg)  Height: 5\' 5"  (1.651 m)    Gen: resting comfortably, no acute distress HEENT: no scleral icterus, pupils equal round and reactive, no palptable cervical adenopathy,  CV: RRR, no m/r/g, no jvd Resp: Clear  to auscultation bilaterally GI: abdomen is soft, non-tender, non-distended, normal bowel sounds, no hepatosplenomegaly MSK: extremities are warm, no edema.  Skin: warm, no rash Neuro:  no focal deficits Psych: appropriate affect   Diagnostic Studies 03/2014 echo Study Conclusions  - Left ventricle: The cavity size was normal. There was mild focal basal hypertrophy of the septum. Systolic function was severely reduced. The estimated ejection fraction was in the range of 25% to 30%. There is akinesis of the entireanteroseptal myocardium. Doppler parameters are consistent with a reversible restrictive pattern, indicative of decreased left ventricular diastolic compliance and/or increased left atrial pressure (grade 3 diastolic dysfunction). - Mitral valve: There was mild regurgitation. - Left atrium: The atrium was moderately dilated. 03/2014 Cath Procedural Findings:  Hemodynamics  RA 8/0 mean 4 mm Hg  RV 42/4 mm Hg  PA 44/14 mean 26 mm Hg  PCWP 20/28 mean 21 mm Hg  LV 98/18 mm Hg  AO 95/54 mean 72 mm Hg  Oxygen saturations:  PA 62%  AO 90%  Cardiac Output (Fick) 6.9 L/min  Cardiac Index (Fick) 3.4 L/min/meter squared  Coronary angiography:  Coronary dominance: right  Left mainstem: Normal   Left anterior descending (LAD): Diffuse disease in the proximal vessel up to 20%. 30-40% disease in the mid vessel prior to the second diagonal.  Left circumflex (LCx): 40% in OM1. 20-30% in OM2. Otherwise no significant disease.  Right coronary artery (RCA): 99% subtotal occlusion at the crux. TIMI 1 flow. Left to right collaterals to the distal vessel.  Left ventriculography: Left ventricular systolic function is abnormal, LVEF is estimated at 25%. There is inferior akinesis and severe global hypokinesis. There is mild mitral regurgitation  Final Conclusions:  1. Single vessel obstructive CAD with subtotal occlusion of the RCA at the crux.  2. Severe LV dysfunction.  3. Normal Right heart pressures. Elevated PCWP.  Recommendations: Medical management. Her degree of LV dysfunction is out of proportion to her CAD. She has no anginal symptoms so PCI of RCA not indicated. The inferior wall appears scarred.  07/2015 echo Study Conclusions  - Left ventricle: The cavity size was at the upper limits of normal. Wall thickness was increased in a pattern of moderate LVH. The estimated ejection fraction was 40% (similar calculation per biplane speckle tracking, reduced global longitudinal strain of -14.3%). There is akinesis of the basalinferolateral and inferior myocardium. Doppler parameters are consistent with abnormal left ventricular relaxation (grade 1 diastolic dysfunction). - Aortic valve: Mildly calcified annulus. Trileaflet. - Mitral valve: Calcified annulus. There was trivial regurgitation. - Left atrium: The atrium was at the upper limits of normal in size. - Right atrium: Central venous pressure (est): 3 mm Hg. - Atrial septum: A patent foramen ovale cannot be excluded. - Tricuspid valve: There was trivial regurgitation. - Pulmonary arteries: Systolic pressure could not be accurately estimated. - Pericardium, extracardiac: There was no pericardial  effusion.  Impressions:  - Upper normal LV chamber size with moderate LVH and LVEF approximately 40% as discussed above. There is akinesis of the basal inferoseptal and inferior myocardium. Grade 1 diastolic dysfunction. Compared to the previous study from October 2015, LVEF is in similar range, perhaps slightly reduced. Upper normal left atrial chamber size. Trivial tricuspid regurgitation. Cannot exclude PFO based on limited images.   01/2018 echo Study Conclusions  - Left ventricle: The cavity size was normal. Wall thickness was increased in a pattern of mild LVH. The estimated ejection fraction was 35%. There is akinesis of the basal-midinferior myocardium.  There is akinesis of the mid-apicalanteroseptal and apical myocardium. Doppler parameters are consistent with abnormal left ventricular relaxation (grade 1 diastolic dysfunction). - Ventricular septum: Septal motion showed abnormal function and dyssynergy suggestive of left bundle [name] block. - Aortic valve: Moderately calcified annulus. Trileaflet. - Mitral valve: There was mild regurgitation. - Right atrium: Central venous pressure (est): 3 mm Hg. - Atrial septum: No defect or patent foramen ovale was identified. - Tricuspid valve: There was trivial regurgitation. - Pulmonary arteries: Systolic pressure could not be accurately estimated. - Pericardium, extracardiac: There was no pericardial effusion    Assessment and Plan  1. Chronic combined systolic/diastolic HF - medical therapy limited by soft bp's. Did not tolerate entresto. She is on her maximally tolerated regimen - discussed possible indications for ICD today. Repeat echo, if remains LVEF 35% or less refer to EP. She has LBBB and may benefit from CRT  2. CAD - medically managed single vessel RCA disease - no symptoms, contniue current meds  3. OSA - strongly encouraged her to f/u with Dr [name] to have cpap arrange.  Discussed the associations of OSA and CHF as well as her generalized fatigue.   4. Hyperlipidemia - request labs from pcp, continue statin.     Arnoldo Lenis, M.D.

## 2018-11-15 ENCOUNTER — Other Ambulatory Visit: Payer: Self-pay | Admitting: Cardiology

## 2018-12-07 ENCOUNTER — Other Ambulatory Visit: Payer: Self-pay

## 2018-12-07 ENCOUNTER — Ambulatory Visit (INDEPENDENT_AMBULATORY_CARE_PROVIDER_SITE_OTHER): Payer: Medicare HMO

## 2018-12-07 DIAGNOSIS — I5022 Chronic systolic (congestive) heart failure: Secondary | ICD-10-CM | POA: Diagnosis not present

## 2018-12-20 ENCOUNTER — Telehealth: Payer: Self-pay | Admitting: *Deleted

## 2018-12-20 NOTE — Telephone Encounter (Signed)
Pt voiced understanding - routed to pcp  

## 2018-12-20 NOTE — Telephone Encounter (Signed)
-----   Message from Arnoldo Lenis, MD sent at 12/15/2018  3:09 PM EDT ----- Heart function is stable but remains weak. We will need to discuss an ICD possibly. With current hospital restrictions due to COVID-19 this is something we will discuss in more details about doing at our visit in June   J Branch MD

## 2019-02-15 ENCOUNTER — Telehealth: Payer: Self-pay | Admitting: Cardiology

## 2019-02-15 MED ORDER — CARVEDILOL 25 MG PO TABS: ORAL_TABLET | ORAL | 2 refills | Status: DC

## 2019-02-15 NOTE — Telephone Encounter (Signed)
Spoke with patient and she says that [facility] is requesting refills for carvedilol. Refill sent to [facility] for carvedilol and patient aware.

## 2019-02-15 NOTE — Telephone Encounter (Signed)
Patient called stating that her insurance company contacted her in regards to her carvedilol (COREG) 25 MG tablet  Stating that they need a referral in order to fill prescription.

## 2019-02-16 ENCOUNTER — Telehealth: Payer: Self-pay | Admitting: *Deleted

## 2019-02-16 ENCOUNTER — Telehealth: Payer: Self-pay | Admitting: Cardiology

## 2019-02-16 MED ORDER — LOSARTAN POTASSIUM 25 MG PO TABS: 25.0000 mg | ORAL_TABLET | Freq: Every day | ORAL | 1 refills | Status: DC

## 2019-02-16 NOTE — Telephone Encounter (Signed)
Patient called stating that she received telephone call today in regards to losartan (COZAAR) 25 MG tablet  .States that [place] states they have not received updated RX.   [phone])

## 2019-02-16 NOTE — Telephone Encounter (Signed)
Patient verbally consented for telehealth visits with [facility] and understands that her insurance company will be billed for the encounter.   Aware to have vitals availble

## 2019-02-16 NOTE — Telephone Encounter (Signed)
Medication sent to pharmacy  

## 2019-02-20 ENCOUNTER — Encounter: Payer: Self-pay | Admitting: Cardiology

## 2019-02-20 ENCOUNTER — Telehealth (INDEPENDENT_AMBULATORY_CARE_PROVIDER_SITE_OTHER): Payer: Medicare HMO | Admitting: Cardiology

## 2019-02-20 VITALS — BP 116/67 | HR 78 | Ht 65.0 in | Wt 242.0 lb

## 2019-02-20 DIAGNOSIS — I5022 Chronic systolic (congestive) heart failure: Secondary | ICD-10-CM | POA: Diagnosis not present

## 2019-02-20 DIAGNOSIS — G4733 Obstructive sleep apnea (adult) (pediatric): Secondary | ICD-10-CM

## 2019-02-20 DIAGNOSIS — I2510 Atherosclerotic heart disease of native coronary artery without angina pectoris: Secondary | ICD-10-CM

## 2019-02-20 DIAGNOSIS — E782 Mixed hyperlipidemia: Secondary | ICD-10-CM

## 2019-02-20 NOTE — Progress Notes (Signed)
Virtual Visit via Telephone Note   This visit type was conducted due to national recommendations for restrictions regarding the COVID-19 Pandemic (e.g. social distancing) in an effort to limit this patient's exposure and mitigate transmission in our community.  Due to her co-morbid illnesses, this patient is at least at moderate risk for complications without adequate follow up.  This format is felt to be most appropriate for this patient at this time.  The patient did not have access to video technology/had technical difficulties with video requiring transitioning to audio format only (telephone).  All issues noted in this document were discussed and addressed.  No physical exam could be performed with this format.  Please refer to the patient's chart for her  consent to telehealth for [facility].   Date:  02/20/2019   ID:  [name], DOB 1951/09/13, MRN [id number]  Patient Location: Home Provider Location: Office  PCP:  [name], MD  Cardiologist:  [name], MD  Electrophysiologist:  None   Evaluation Performed:  Follow-Up Visit  Chief Complaint:  Chronic systolic heart failure  History of Present Illness:    [name] is a 68 y.o. female seen today for follow up of the following meidcal problems.   1. Chronic combined systolicand diastolic heart failure - echo 03/2014 LVEF 25-30%, restrictive diastolic dysfunction. New diagnosis at that time. Repeat echo 06/2014 LVEF 40-45% - cath 03/2014 showed RCA 99% with left to right collaterals, other arteries patent. Overall LV systolic dysfunction out of proportion to CAD. Medically managed.  - 07/2015 echo LVEF 31%, grade I diastolic dysfunction  - Entresto causes dizziness and nausea, stopped taking. Back on losartan   01/2018 echo LVEF 35%, grade I diastoilc dysfunction  -  due to low bp's we lowered losartan to 25mg  daily. Lowered lasix to 20mg  daily, may take 40mg  as needed.   - 11/2018 echo LVEF  30-35% - chronic SOB unchanged - no recent edema - compliant with meds  2. CAD - cath 03/2014 with 99% chronic RCA disease, otherwise patent vessels. Managed medically  - denies any recent ches tpain.   3. OSA  - severe OSA by sleep study 04/2016 - she reports issues getting her CPAP, has not gotten order yet.  - has not been able to follow with Dr Luan Pulling to have her CPAP arranged, reports she has called several times over several months  4. Hyperlipidemia -compliant with statin  5. Chronic LBBB    The patient does not have symptoms concerning for COVID-19 infection (fever, chills, cough, or new shortness of breath).    Past Medical History:  Diagnosis Date  . Anemia   . Arthritis   . CAD (coronary artery disease)    a. LHC (03/2014) Lmain: nl, LAD: diff dz proximal 20%, 30-40% dz mid vessel prior 2nd diagonal, LCx: 40% in OM1, 20-30% in OM2, RCA: 99% subtotal occlusion @ crux, TIMI 1 flow, L-R collaterals to distal vessel  . Chronic systolic heart failure ([place])    a. ECHO (03/2014): EF 25-30%, akinesis enteroanteroseptal myocardium, grade III DD b. RHC (03/2014) RA 4, RV 42/4, PA 44/14 (26), PCWP 21, PA 62% Fick CO/CI 6.9 / 3.4  . Diabetes mellitus   . Duodenal ulcer    Age 21  . Erosive esophagitis   . Gastritis   . Hypertension   . Ischemic cardiomyopathy   . Obese   . Short-term memory loss   . Thyroid disease   . TTP (thrombotic thrombocytopenic purpura) (  [facility])    Past Surgical History:  Procedure Laterality Date  . COLONOSCOPY  08/29/2012   URK:[id number] diverticulosis  . COLONOSCOPY N/A 05/28/2015   Procedure: COLONOSCOPY;  Surgeon: Daneil Dolin, MD;  Location: [facility];  Service: Endoscopy;  Laterality: N/A;  1015  . ECTOPIC PREGNANCY SURGERY    . ESOPHAGOGASTRODUODENOSCOPY  04/20/2012   SEG:BTDVVOH antral gastritis/Erosive reflux esophagitis  . LEFT AND RIGHT HEART CATHETERIZATION WITH CORONARY ANGIOGRAM N/A 03/28/2014   Procedure: LEFT AND RIGHT  HEART CATHETERIZATION WITH CORONARY ANGIOGRAM;  Surgeon: Peter M Martinique, MD;  Location: [facility] CATH LAB;  Service: Cardiovascular;  Laterality: N/A;  . SPLENECTOMY, PARTIAL       No outpatient medications have been marked as taking for the 02/20/19 encounter (Appointment) with Arnoldo Lenis, MD.     Allergies:   Other and Lisinopril   Social History   Tobacco Use  . Smoking status: Former Smoker    Packs/day: 0.25    Years: 10.00    Pack years: 2.50    Types: Cigarettes    Start date: 09/20/1966    Last attempt to quit: 09/20/1978    Years since quitting: 40.4  . Smokeless tobacco: Never Used  . Tobacco comment: Quit 2008  Substance Use Topics  . Alcohol use: No    Alcohol/week: 0.0 standard drinks  . Drug use: No     Family Hx: The patient's family history includes Cirrhosis in her father; Diabetes in her daughter; Heart failure in her mother. There is no history of Colon cancer.  ROS:   Please see the history of present illness.     All other systems reviewed and are negative.   Prior CV studies:   The following studies were reviewed today:  03/2014 echo Study Conclusions  - Left ventricle: The cavity size was normal. There was mild focal basal hypertrophy of the septum. Systolic function was severely reduced. The estimated ejection fraction was in the range of 25% to 30%. There is akinesis of the entireanteroseptal myocardium. Doppler parameters are consistent with a reversible restrictive pattern, indicative of decreased left ventricular diastolic compliance and/or increased left atrial pressure (grade 3 diastolic dysfunction). - Mitral valve: There was mild regurgitation. - Left atrium: The atrium was moderately dilated. 03/2014 Cath Procedural Findings:  Hemodynamics  RA 8/0 mean 4 mm Hg  RV 42/4 mm Hg  PA 44/14 mean 26 mm Hg  PCWP 20/28 mean 21 mm Hg  LV 98/18 mm Hg  AO 95/54 mean 72 mm Hg  Oxygen saturations:  PA 62%  AO 90%  Cardiac  Output (Fick) 6.9 L/min  Cardiac Index (Fick) 3.4 L/min/meter squared  Coronary angiography:  Coronary dominance: right  Left mainstem: Normal  Left anterior descending (LAD): Diffuse disease in the proximal vessel up to 20%. 30-40% disease in the mid vessel prior to the second diagonal.  Left circumflex (LCx): 40% in OM1. 20-30% in OM2. Otherwise no significant disease.  Right coronary artery (RCA): 99% subtotal occlusion at the crux. TIMI 1 flow. Left to right collaterals to the distal vessel.  Left ventriculography: Left ventricular systolic function is abnormal, LVEF is estimated at 25%. There is inferior akinesis and severe global hypokinesis. There is mild mitral regurgitation  Final Conclusions:  1. Single vessel obstructive CAD with subtotal occlusion of the RCA at the crux.  2. Severe LV dysfunction.  3. Normal Right heart pressures. Elevated PCWP.  Recommendations: Medical management. Her degree of LV dysfunction is out of proportion to her  CAD. She has no anginal symptoms so PCI of RCA not indicated. The inferior wall appears scarred.  07/2015 echo Study Conclusions  - Left ventricle: The cavity size was at the upper limits of normal. Wall thickness was increased in a pattern of moderate LVH. The estimated ejection fraction was 40% (similar calculation per biplane speckle tracking, reduced global longitudinal strain of -14.3%). There is akinesis of the basalinferolateral and inferior myocardium. Doppler parameters are consistent with abnormal left ventricular relaxation (grade 1 diastolic dysfunction). - Aortic valve: Mildly calcified annulus. Trileaflet. - Mitral valve: Calcified annulus. There was trivial regurgitation. - Left atrium: The atrium was at the upper limits of normal in size. - Right atrium: Central venous pressure (est): 3 mm Hg. - Atrial septum: A patent foramen ovale cannot be excluded. - Tricuspid valve: There was trivial  regurgitation. - Pulmonary arteries: Systolic pressure could not be accurately estimated. - Pericardium, extracardiac: There was no pericardial effusion.  Impressions:  - Upper normal LV chamber size with moderate LVH and LVEF approximately 40% as discussed above. There is akinesis of the basal inferoseptal and inferior myocardium. Grade 1 diastolic dysfunction. Compared to the previous study from October 2015, LVEF is in similar range, perhaps slightly reduced. Upper normal left atrial chamber size. Trivial tricuspid regurgitation. Cannot exclude PFO based on limited images.   01/2018 echo Study Conclusions  - Left ventricle: The cavity size was normal. Wall thickness was increased in a pattern of mild LVH. The estimated ejection fraction was 35%. There is akinesis of the basal-midinferior myocardium. There is akinesis of the mid-apicalanteroseptal and apical myocardium. Doppler parameters are consistent with abnormal left ventricular relaxation (grade 1 diastolic dysfunction). - Ventricular septum: Septal motion showed abnormal function and dyssynergy suggestive of left bundle Kimerly Rowand block. - Aortic valve: Moderately calcified annulus. Trileaflet. - Mitral valve: There was mild regurgitation. - Right atrium: Central venous pressure (est): 3 mm Hg. - Atrial septum: No defect or patent foramen ovale was identified. - Tricuspid valve: There was trivial regurgitation. - Pulmonary arteries: Systolic pressure could not be accurately estimated. - Pericardium, extracardiac: There was no pericardial effusion   11/2018 echo 1. The left ventricle has moderate-severely reduced systolic function, with an ejection fraction of 30-35%. The cavity size was normal. There is mildly increased left ventricular wall thickness. Left ventricular diastolic Doppler parameters are  consistent with impaired relaxation. Elevated mean left atrial pressure.  2. The  anteroseptal, anterolateral, apical walls are hypokinetic.  3. The right ventricle has normal systolic function. The cavity was normal. There is no increase in right ventricular wall thickness.  4. Left atrial size was moderately dilated.  5. No evidence of mitral valve stenosis.  6. The aortic valve is tricuspid no stenosis of the aortic valve.  7. Pulmonary hypertension is indeterminant, inadequate TR jet.  8. The inferior vena cava was dilated in size with <50% respiratory variability.  9. The interatrial septum was not well visualized.    Labs/Other Tests and Data Reviewed:    EKG:  No ECG reviewed.  Recent Labs: No results found for requested labs within last 8760 hours.   Recent Lipid Panel No results found for: CHOL, TRIG, HDL, CHOLHDL, LDLCALC, LDLDIRECT  Wt Readings from Last 3 Encounters:  11/14/18 238 lb (108 kg)  08/31/18 238 lb 9.6 oz (108.2 kg)  12/29/17 233 lb (105.7 kg)     Objective:    Vital Signs:   Today's Vitals   02/20/19 0942  BP: 116/67  Pulse: 78  Weight: 242 lb (109.8 kg)  Height: 5\' 5"  (1.651 m)   Body mass index is 40.27 kg/m.  Normal affect. Normal speech pattern and tone. Comfortable no apparent distress. No audible signs of SOB or wheezing.   ASSESSMENT & PLAN:    1. Chronic combined systolic/diastolic HF - medical therapy limited by soft bp's. Did not tolerate entresto. She is on her maximally tolerated regimen - repeat echo shows LVEF remains 30-35%. We will refer to EP to discuss ICD, she has chronic LBBB and may benefit from CRT as well  2. CAD - medically managed single vessel RCA disease -no recent symptoms, continue current meds  3. OSA - very tough time since 2017 sleep study being able to coordinate follow and getting her machine through Dr Luan Pulling. We will refer to Dr Golden Hurter to help manage her sleep apnea.   4. Hyperlipidemia - continue statin  COVID-19 Education: The signs and symptoms of COVID-19 were  discussed with the patient and how to seek care for testing (follow up with PCP or arrange E-visit).  The importance of social distancing was discussed today.  Time:   Today, I have spent 18 minutes with the patient with telehealth technology discussing the above problems.     Medication Adjustments/Labs and Tests Ordered: Current medicines are reviewed at length with the patient today.  Concerns regarding medicines are outlined above.   Tests Ordered: No orders of the defined types were placed in this encounter.   Medication Changes: No orders of the defined types were placed in this encounter.   Disposition:  Follow up 3 months  Signed, [name], MD  02/20/2019 8:20 AM    [place]

## 2019-02-20 NOTE — Patient Instructions (Signed)
Medication Instructions:  Continue all current medications.  Labwork: none  Testing/Procedures: none  Follow-Up: 3 months   Any Other Special Instructions Will Be Listed Below (If Applicable).  You have been referred to:  Dr. Rayann Heman - evaluate need for ICD / CRT - first visit will need to be in [place] at our [facility], but follow up afterwards can be back in the [place] office.    You have been referred to:  Dr. Golden Hurter - evaluate for sleep apnea.    If you need a refill on your cardiac medications before your next appointment, please call your pharmacy.

## 2019-02-27 ENCOUNTER — Telehealth: Payer: Self-pay

## 2019-02-27 NOTE — Telephone Encounter (Signed)
I called patient about appointment on 03/02/19 with Dr. Rayann Heman, he likes to use mychart for video visits. Has patient ever been sent the link for mychart or tried to set up mychart?

## 2019-03-01 ENCOUNTER — Telehealth: Payer: Self-pay | Admitting: *Deleted

## 2019-03-01 NOTE — Telephone Encounter (Signed)
-----   Message from Chanda Busing sent at 03/01/2019 12:23 PM EDT ----- Regarding: [place] DR. Branch is referring patient for obstructive sleep apnea.

## 2019-03-02 ENCOUNTER — Other Ambulatory Visit: Payer: Self-pay

## 2019-03-02 ENCOUNTER — Encounter: Payer: Self-pay | Admitting: Internal Medicine

## 2019-03-02 ENCOUNTER — Telehealth (INDEPENDENT_AMBULATORY_CARE_PROVIDER_SITE_OTHER): Payer: Medicare HMO | Admitting: Internal Medicine

## 2019-03-02 VITALS — Ht 65.0 in | Wt 243.0 lb

## 2019-03-02 DIAGNOSIS — I10 Essential (primary) hypertension: Secondary | ICD-10-CM

## 2019-03-02 DIAGNOSIS — I5022 Chronic systolic (congestive) heart failure: Secondary | ICD-10-CM | POA: Diagnosis not present

## 2019-03-02 DIAGNOSIS — G4733 Obstructive sleep apnea (adult) (pediatric): Secondary | ICD-10-CM | POA: Diagnosis not present

## 2019-03-02 DIAGNOSIS — I2510 Atherosclerotic heart disease of native coronary artery without angina pectoris: Secondary | ICD-10-CM | POA: Diagnosis not present

## 2019-03-02 NOTE — Progress Notes (Signed)
Electrophysiology TeleHealth Note   Due to national recommendations of social distancing due to [place] 19, an audio telehealth visit is felt to be most appropriate for this patient at this time.  Verbal consent was obtained by me for the telehealth visit today.  The patient does not have capability for a virtual visit.  A phone visit is therefore required today.   Date:  03/02/2019   ID:  [name], DOB [birth year], MRN [id number]  Location: patient's home  Provider location:  [facility]  Evaluation Performed: Follow-up visit  PCP:  [name], MD   Electrophysiologist:  Dr Rayann Heman  Chief Complaint:  Evaluate for CRTD  History of Present Illness:    [name] is a 68 y.o. female who presents via telehealth conferencing today.  She was referred by Dr Harl Bowie for evaluation of CRTD. She has a longstanding NICM and LBBB despite guideline directed medical therapy. Today, she denies symptoms of palpitations, chest pain, shortness of breath (above baseline),  lower extremity edema, dizziness, presyncope, or syncope.  The patient is otherwise without complaint today.  The patient denies symptoms of fevers, chills, cough, or new SOB worrisome for COVID 19.  Past Medical History:  Diagnosis Date  . Anemia   . Arthritis   . CAD (coronary artery disease)    a. LHC (03/2014) Lmain: nl, LAD: diff dz proximal 20%, 30-40% dz mid vessel prior 2nd diagonal, LCx: 40% in OM1, 20-30% in OM2, RCA: 99% subtotal occlusion @ crux, TIMI 1 flow, L-R collaterals to distal vessel  . Chronic systolic heart failure ([place])    a. ECHO (03/2014): EF 25-30%, akinesis enteroanteroseptal myocardium, grade III DD b. RHC (03/2014) RA 4, RV 42/4, PA 44/14 (26), PCWP 21, PA 62% Fick CO/CI 6.9 / 3.4  . Diabetes mellitus   . Duodenal ulcer    Age 19  . Erosive esophagitis   . Gastritis   . Hypertension   . Ischemic cardiomyopathy   . Obese   . Short-term memory loss   . Thyroid disease   . TTP  (thrombotic thrombocytopenic purpura) ([facility])     Past Surgical History:  Procedure Laterality Date  . COLONOSCOPY  08/29/2012   NWG:NFAOZHY diverticulosis  . COLONOSCOPY N/A 05/28/2015   Procedure: COLONOSCOPY;  Surgeon: Daneil Dolin, MD;  Location[facility];  Service: Endoscopy;  Laterality: N/A;  1015  . ECTOPIC PREGNANCY SURGERY    . ESOPHAGOGASTRODUODENOSCOPY  04/20/2012   QMV:HQIONGE antral gastritis/Erosive reflux esophagitis  . LEFT AND RIGHT HEART CATHETERIZATION WITH CORONARY ANGIOGRAM N/A 03/28/2014   Procedure: LEFT AND RIGHT HEART CATHETERIZATION WITH CORONARY ANGIOGRAM;  Surgeon: Peter M Martinique, MD;  Location: [facility] CATH LAB;  Service: Cardiovascular;  Laterality: N/A;  . SPLENECTOMY, PARTIAL      Current Outpatient Medications  Medication Sig Dispense Refill  . acetaminophen (TYLENOL) 500 MG tablet Take 1,000 mg by mouth every 6 (six) hours as needed for moderate pain.     Marland Kitchen aspirin 81 MG chewable tablet Chew 1 tablet (81 mg total) by mouth daily.    Marland Kitchen atorvastatin (LIPITOR) 80 MG tablet Take 80 mg by mouth daily.    . carvedilol (COREG) 25 MG tablet Take 25 mg by mouth 2 (two) times daily with a meal.    . furosemide (LASIX) 20 MG tablet Take 20 mg by mouth daily.    . insulin glargine (LANTUS) 100 UNIT/ML injection Inject 40 Units into the skin 2 (two) times daily.    Marland Kitchen  levothyroxine (SYNTHROID, LEVOTHROID) 25 MCG tablet Take 25 mcg by mouth daily before breakfast.    . losartan (COZAAR) 25 MG tablet Take 1 tablet (25 mg total) by mouth daily. 90 tablet 1  . Multiple Vitamin (MULTIVITAMIN) tablet Take 1 tablet by mouth daily.    Marland Kitchen NOVOLOG 100 UNIT/ML injection Inject 10 Units into the skin 3 (three) times daily with meals.     . Omega-3 Fatty Acids (FISH OIL) 1000 MG CPDR Take 1 capsule by mouth daily.     . polyethylene glycol (MIRALAX / GLYCOLAX) packet Take 17 g by mouth daily.    Marland Kitchen spironolactone (ALDACTONE) 25 MG tablet Take 25 mg by mouth daily.     No current  facility-administered medications for this visit.     Allergies:   Other and Lisinopril   Social History:  The patient  reports that she quit smoking about 40 years ago. Her smoking use included cigarettes. She started smoking about 52 years ago. She has a 2.50 pack-year smoking history. She has never used smokeless tobacco. She reports that she does not drink alcohol or use drugs.   Family History:  The patient's  family history includes Cirrhosis in her father; Diabetes in her daughter; Heart failure in her mother.   ROS:  Please see the history of present illness.   All other systems are personally reviewed and negative.    Exam:    Vital Signs:  Ht 5\' 5"  (1.651 m)   Wt 243 lb (110.2 kg)   BMI 40.44 kg/m   Well sounding today   Labs/Other Tests and Data Reviewed:    Recent Labs: No results found for requested labs within last 8760 hours.   Wt Readings from Last 3 Encounters:  03/02/19 243 lb (110.2 kg)  02/20/19 242 lb (109.8 kg)  11/14/18 238 lb (108 kg)     Dr Nelly Laurence notes and echo reviewed    ASSESSMENT & PLAN:    1.  Longstanding mixed cardiomyopathy/ LBBB She has a longstanding cardiomyopathy despite guideline directed medical therapy. She also has a LBBB and would be expected to benefit from CRT.  The patient has an mixed CM (EF 30-35%), NYHA Class III CHF, and CAD.  He is referred by Dr Harl Bowie for risk stratification of sudden death and consideration of ICD implantation.  At this time, she meets MADIT II/ SCD-HeFT criteria for ICD implantation for primary prevention of sudden death.  I have had a thorough discussion with the patient reviewing options.  The patient and their family (if available) have had opportunities to ask questions and have them answered. The patient and I have decided together through a shared decision making process to CRT ICD at this time.   Risks, benefits, alternatives to ICD implantation were discussed in detail with the patient today.  The patient understands that the risks include but are not limited to bleeding, infection, pneumothorax, perforation, tamponade, vascular damage, renal failure, MI, stroke, death, inappropriate shocks, and lead dislodgement and wishes to proceed. She would like to discuss further with her family prior to proceeding. Will have my nurse reach out next week to follow up.   2.  CAD No recent ischemic symptoms Continue medical therapy  3.  OSA Dr Luan Pulling is working on CPAP    Follow-up:  After CRTD implant    Patient Risk:  after full review of this patients clinical status, I feel that they are at moderate risk at this time.  Today, I have spent  25 minutes with the patient with telehealth technology discussing arrhythmia management .    SignedThompson Grayer, MD  03/02/2019 2:22 PM     [place] [place]  [address] [phone] (office) [phone] (fax)

## 2019-03-02 NOTE — Telephone Encounter (Signed)
Patient had telephone visit with Dr. Rayann Heman, did not set up mychart.

## 2019-03-26 ENCOUNTER — Other Ambulatory Visit: Payer: Self-pay | Admitting: Cardiology

## 2019-03-26 MED ORDER — SPIRONOLACTONE 25 MG PO TABS: 25.0000 mg | ORAL_TABLET | Freq: Every day | ORAL | 2 refills | Status: DC

## 2019-03-26 NOTE — Telephone Encounter (Signed)
° ° °  1. Which medications need to be refilled? (please list name of each medication and dose if known)  spironolactone (ALDACTONE) 25 MG tablet    2. Which pharmacy/location (including street and city if local pharmacy) is medication to be sent to? [facility]   3. Do they need a 30 day or 90 day supply? 90  Patient states that she received telephone call today from [facility] stating that we had denied her refill.

## 2019-04-06 ENCOUNTER — Other Ambulatory Visit: Payer: Self-pay | Admitting: *Deleted

## 2019-04-06 ENCOUNTER — Telehealth: Payer: Self-pay | Admitting: Cardiology

## 2019-04-06 MED ORDER — ATORVASTATIN CALCIUM 80 MG PO TABS: 80.0000 mg | ORAL_TABLET | Freq: Every day | ORAL | 1 refills | Status: DC

## 2019-04-06 NOTE — Telephone Encounter (Signed)
Pt aware that we have not received anything from [facility] regarding atorvastatin and haven't sent refills in for some time - explained that we would send refills in and if [facility] responds with a PA we would take care of that - pt appreciative

## 2019-04-06 NOTE — Telephone Encounter (Signed)
Patient states that [facility] told her we denied her medication Lipitor 80 mg .States that someone from our office told them she was not a patient here.   [phone])

## 2019-04-16 ENCOUNTER — Telehealth: Payer: Self-pay

## 2019-04-16 DIAGNOSIS — I5042 Chronic combined systolic (congestive) and diastolic (congestive) heart failure: Secondary | ICD-10-CM

## 2019-04-16 DIAGNOSIS — I5022 Chronic systolic (congestive) heart failure: Secondary | ICD-10-CM

## 2019-04-16 NOTE — Telephone Encounter (Signed)
Call placed to Pt.   Pt scheduled for BIV ICD on august 13 at 1:00 pm  Will get labs/covid test in [place] on August 10

## 2019-04-17 NOTE — Telephone Encounter (Signed)
Instructions mailed.  Work up complete

## 2019-04-30 ENCOUNTER — Other Ambulatory Visit (HOSPITAL_COMMUNITY)
Admission: RE | Admit: 2019-04-30 | Discharge: 2019-04-30 | Disposition: A | Discharge status: Home / Self Care | Payer: Medicare HMO | Source: Ambulatory Visit | Attending: Internal Medicine | Admitting: Internal Medicine

## 2019-04-30 ENCOUNTER — Other Ambulatory Visit: Payer: Self-pay

## 2019-04-30 DIAGNOSIS — Z01812 Encounter for preprocedural laboratory examination: Secondary | ICD-10-CM | POA: Diagnosis not present

## 2019-04-30 DIAGNOSIS — Z20828 Contact with and (suspected) exposure to other viral communicable diseases: Secondary | ICD-10-CM | POA: Diagnosis not present

## 2019-04-30 LAB — CBC WITH DIFFERENTIAL/PLATELET
Abs Immature Granulocytes: 0.01 10*3/uL (ref 0.00–0.07)
Basophils Absolute: 0 10*3/uL (ref 0.0–0.1)
Basophils Relative: 1 %
Eosinophils Absolute: 0.1 10*3/uL (ref 0.0–0.5)
Eosinophils Relative: 3 %
HCT: 34.7 % — ABNORMAL LOW (ref 36.0–46.0)
Hemoglobin: 11.2 g/dL — ABNORMAL LOW (ref 12.0–15.0)
Immature Granulocytes: 0 %
Lymphocytes Relative: 30 %
Lymphs Abs: 1.6 10*3/uL (ref 0.7–4.0)
MCH: 27.7 pg (ref 26.0–34.0)
MCHC: 32.3 g/dL (ref 30.0–36.0)
MCV: 85.9 fL (ref 80.0–100.0)
Monocytes Absolute: 0.6 10*3/uL (ref 0.1–1.0)
Monocytes Relative: 11 %
Neutro Abs: 3 10*3/uL (ref 1.7–7.7)
Neutrophils Relative %: 55 %
Platelets: 170 10*3/uL (ref 150–400)
RBC: 4.04 MIL/uL (ref 3.87–5.11)
RDW: 16.5 % — ABNORMAL HIGH (ref 11.5–15.5)
WBC: 5.3 10*3/uL (ref 4.0–10.5)
nRBC: 0 % (ref 0.0–0.2)

## 2019-04-30 LAB — BASIC METABOLIC PANEL
Anion gap: 8 (ref 5–15)
BUN: 19 mg/dL (ref 8–23)
CO2: 25 mmol/L (ref 22–32)
Calcium: 9 mg/dL (ref 8.9–10.3)
Chloride: 104 mmol/L (ref 98–111)
Creatinine, Ser: 0.96 mg/dL (ref 0.44–1.00)
GFR calc Af Amer: >60 mL/min (ref >60)
GFR calc non Af Amer: 60 mL/min — ABNORMAL LOW (ref >60)
Glucose, Bld: 86 mg/dL (ref 70–99)
Potassium: 4.1 mmol/L (ref 3.5–5.1)
Sodium: 137 mmol/L (ref 135–145)

## 2019-04-30 LAB — SARS CORONAVIRUS 2 (TAT 6-24 HRS): SARS Coronavirus 2: NEGATIVE

## 2019-05-03 ENCOUNTER — Other Ambulatory Visit: Payer: Self-pay

## 2019-05-03 ENCOUNTER — Ambulatory Visit (HOSPITAL_COMMUNITY)
Admission: RE | Admit: 2019-05-03 | Discharge: 2019-05-04 | Disposition: A | Discharge status: Home / Self Care | Payer: Medicare HMO | Attending: Internal Medicine | Admitting: Internal Medicine

## 2019-05-03 ENCOUNTER — Encounter (HOSPITAL_COMMUNITY)
Admission: RE | Disposition: A | Discharge status: Home / Self Care | Payer: Medicare HMO | Source: Home / Self Care | Attending: Internal Medicine

## 2019-05-03 DIAGNOSIS — I447 Left bundle-branch block, unspecified: Secondary | ICD-10-CM | POA: Insufficient documentation

## 2019-05-03 DIAGNOSIS — M311 Thrombotic microangiopathy: Secondary | ICD-10-CM | POA: Diagnosis not present

## 2019-05-03 DIAGNOSIS — E669 Obesity, unspecified: Secondary | ICD-10-CM | POA: Insufficient documentation

## 2019-05-03 DIAGNOSIS — I110 Hypertensive heart disease with heart failure: Secondary | ICD-10-CM | POA: Diagnosis not present

## 2019-05-03 DIAGNOSIS — G4733 Obstructive sleep apnea (adult) (pediatric): Secondary | ICD-10-CM | POA: Insufficient documentation

## 2019-05-03 DIAGNOSIS — Z7982 Long term (current) use of aspirin: Secondary | ICD-10-CM | POA: Insufficient documentation

## 2019-05-03 DIAGNOSIS — E039 Hypothyroidism, unspecified: Secondary | ICD-10-CM | POA: Diagnosis not present

## 2019-05-03 DIAGNOSIS — Z794 Long term (current) use of insulin: Secondary | ICD-10-CM | POA: Diagnosis not present

## 2019-05-03 DIAGNOSIS — I5022 Chronic systolic (congestive) heart failure: Secondary | ICD-10-CM | POA: Diagnosis not present

## 2019-05-03 DIAGNOSIS — Z006 Encounter for examination for normal comparison and control in clinical research program: Secondary | ICD-10-CM | POA: Insufficient documentation

## 2019-05-03 DIAGNOSIS — Z79899 Other long term (current) drug therapy: Secondary | ICD-10-CM | POA: Diagnosis not present

## 2019-05-03 DIAGNOSIS — I2510 Atherosclerotic heart disease of native coronary artery without angina pectoris: Secondary | ICD-10-CM | POA: Insufficient documentation

## 2019-05-03 DIAGNOSIS — Z888 Allergy status to other drugs, medicaments and biological substances status: Secondary | ICD-10-CM | POA: Insufficient documentation

## 2019-05-03 DIAGNOSIS — Z79890 Hormone replacement therapy (postmenopausal): Secondary | ICD-10-CM | POA: Diagnosis not present

## 2019-05-03 DIAGNOSIS — Z881 Allergy status to other antibiotic agents status: Secondary | ICD-10-CM | POA: Insufficient documentation

## 2019-05-03 DIAGNOSIS — Z6841 Body Mass Index (BMI) 40.0 and over, adult: Secondary | ICD-10-CM | POA: Insufficient documentation

## 2019-05-03 DIAGNOSIS — Z87891 Personal history of nicotine dependence: Secondary | ICD-10-CM | POA: Diagnosis not present

## 2019-05-03 DIAGNOSIS — I428 Other cardiomyopathies: Secondary | ICD-10-CM | POA: Insufficient documentation

## 2019-05-03 DIAGNOSIS — Z959 Presence of cardiac and vascular implant and graft, unspecified: Secondary | ICD-10-CM

## 2019-05-03 DIAGNOSIS — E119 Type 2 diabetes mellitus without complications: Secondary | ICD-10-CM | POA: Insufficient documentation

## 2019-05-03 HISTORY — PX: BIV ICD INSERTION CRT-D: EP1195

## 2019-05-03 LAB — SURGICAL PCR SCREEN
MRSA, PCR: NEGATIVE
Staphylococcus aureus: NEGATIVE

## 2019-05-03 LAB — GLUCOSE, CAPILLARY
Glucose-Capillary: 107 mg/dL — ABNORMAL HIGH (ref 70–99)
Glucose-Capillary: 83 mg/dL (ref 70–99)
Glucose-Capillary: 102 mg/dL — ABNORMAL HIGH (ref 70–99)

## 2019-05-03 LAB — HEMOGLOBIN A1C
Hgb A1c MFr Bld: 8.4 % — ABNORMAL HIGH (ref 4.8–5.6)
Mean Plasma Glucose: 194.38 mg/dL

## 2019-05-03 SURGERY — BIV ICD INSERTION CRT-D

## 2019-05-03 MED ORDER — SODIUM CHLORIDE 0.9 % IV SOLN
80.0000 mg | INTRAVENOUS | Status: AC
  Administered 2019-05-03: 80 mg

## 2019-05-03 MED ORDER — MIDAZOLAM HCL 5 MG/5ML IJ SOLN
INTRAMUSCULAR | Status: DC | PRN
  Administered 2019-05-03 (×3): 1 mg via INTRAVENOUS

## 2019-05-03 MED ORDER — LIDOCAINE HCL (PF) 1 % IJ SOLN
INTRAMUSCULAR | Status: DC | PRN
  Administered 2019-05-03: 60 mL

## 2019-05-03 MED ORDER — INSULIN GLARGINE 100 UNIT/ML ~~LOC~~ SOLN
40.0000 [IU] | Freq: Two times a day (BID) | SUBCUTANEOUS | Status: DC
  Administered 2019-05-03 – 2019-05-04 (×2): 40 [IU] via SUBCUTANEOUS
  Filled 2019-05-03 (×3): qty 0.4

## 2019-05-03 MED ORDER — CARVEDILOL 25 MG PO TABS: 25.0000 mg | ORAL_TABLET | Freq: Two times a day (BID) | ORAL | Status: DC

## 2019-05-03 MED ORDER — SODIUM CHLORIDE 0.9 % IV SOLN
INTRAVENOUS | Status: AC
  Filled 2019-05-03: qty 2

## 2019-05-03 MED ORDER — LIDOCAINE HCL (PF) 1 % IJ SOLN
INTRAMUSCULAR | Status: AC
  Filled 2019-05-03: qty 30

## 2019-05-03 MED ORDER — INSULIN ASPART 100 UNIT/ML ~~LOC~~ SOLN: 0.0000 [IU] | Freq: Every day | SUBCUTANEOUS | Status: DC

## 2019-05-03 MED ORDER — MUPIROCIN 2 % EX OINT
TOPICAL_OINTMENT | CUTANEOUS | Status: AC
  Administered 2019-05-03: 12:00:00 via TOPICAL
  Filled 2019-05-03: qty 22

## 2019-05-03 MED ORDER — LOSARTAN POTASSIUM 25 MG PO TABS
25.0000 mg | ORAL_TABLET | Freq: Every day | ORAL | Status: DC
  Administered 2019-05-03 – 2019-05-04 (×2): 25 mg via ORAL
  Filled 2019-05-03 (×2): qty 1

## 2019-05-03 MED ORDER — HEPARIN (PORCINE) IN NACL 1000-0.9 UT/500ML-% IV SOLN
INTRAVENOUS | Status: DC | PRN
  Administered 2019-05-03: 500 mL

## 2019-05-03 MED ORDER — MIDAZOLAM HCL 5 MG/5ML IJ SOLN
INTRAMUSCULAR | Status: AC
  Filled 2019-05-03: qty 5

## 2019-05-03 MED ORDER — CEFAZOLIN SODIUM-DEXTROSE 2-4 GM/100ML-% IV SOLN
INTRAVENOUS | Status: AC
  Filled 2019-05-03: qty 100

## 2019-05-03 MED ORDER — SODIUM CHLORIDE 0.9 % IV SOLN
INTRAVENOUS | Status: DC
  Administered 2019-05-03: 12:00:00 via INTRAVENOUS

## 2019-05-03 MED ORDER — HEPARIN (PORCINE) IN NACL 1000-0.9 UT/500ML-% IV SOLN
INTRAVENOUS | Status: AC
  Filled 2019-05-03: qty 500

## 2019-05-03 MED ORDER — ACETAMINOPHEN 325 MG PO TABS: 325.0000 mg | ORAL_TABLET | ORAL | Status: DC | PRN

## 2019-05-03 MED ORDER — FENTANYL CITRATE (PF) 100 MCG/2ML IJ SOLN
INTRAMUSCULAR | Status: AC
  Filled 2019-05-03: qty 2

## 2019-05-03 MED ORDER — ONDANSETRON HCL 4 MG/2ML IJ SOLN
4.0000 mg | Freq: Four times a day (QID) | INTRAMUSCULAR | Status: DC | PRN
  Administered 2019-05-03 – 2019-05-04 (×2): 4 mg via INTRAVENOUS
  Filled 2019-05-03 (×2): qty 2

## 2019-05-03 MED ORDER — HYDROCODONE-ACETAMINOPHEN 5-325 MG PO TABS
1.0000 | ORAL_TABLET | ORAL | Status: DC | PRN
  Administered 2019-05-03 – 2019-05-04 (×2): 2 via ORAL
  Filled 2019-05-03 (×2): qty 2

## 2019-05-03 MED ORDER — CEFAZOLIN SODIUM-DEXTROSE 1-4 GM/50ML-% IV SOLN
1.0000 g | Freq: Four times a day (QID) | INTRAVENOUS | Status: AC
  Administered 2019-05-03 – 2019-05-04 (×3): 1 g via INTRAVENOUS
  Filled 2019-05-03 (×4): qty 50

## 2019-05-03 MED ORDER — MUPIROCIN 2 % EX OINT
1.0000 "application " | TOPICAL_OINTMENT | Freq: Once | CUTANEOUS | Status: AC
  Administered 2019-05-03: 12:00:00 via TOPICAL
  Filled 2019-05-03: qty 22

## 2019-05-03 MED ORDER — FUROSEMIDE 20 MG PO TABS
20.0000 mg | ORAL_TABLET | Freq: Every day | ORAL | Status: DC
  Administered 2019-05-04: 20 mg via ORAL
  Filled 2019-05-03: qty 1

## 2019-05-03 MED ORDER — SODIUM CHLORIDE 0.9% FLUSH
3.0000 mL | Freq: Two times a day (BID) | INTRAVENOUS | Status: DC
  Administered 2019-05-03 – 2019-05-04 (×2): 3 mL via INTRAVENOUS

## 2019-05-03 MED ORDER — INSULIN ASPART 100 UNIT/ML ~~LOC~~ SOLN
0.0000 [IU] | Freq: Three times a day (TID) | SUBCUTANEOUS | Status: DC
  Administered 2019-05-04: 2 [IU] via SUBCUTANEOUS

## 2019-05-03 MED ORDER — SODIUM CHLORIDE 0.9% FLUSH: 3.0000 mL | INTRAVENOUS | Status: DC | PRN

## 2019-05-03 MED ORDER — IOHEXOL 350 MG/ML SOLN
INTRAVENOUS | Status: DC | PRN
  Administered 2019-05-03: 15 mL

## 2019-05-03 MED ORDER — CEFAZOLIN SODIUM-DEXTROSE 2-4 GM/100ML-% IV SOLN
2.0000 g | INTRAVENOUS | Status: AC
  Administered 2019-05-03: 2 g via INTRAVENOUS

## 2019-05-03 MED ORDER — CHLORHEXIDINE GLUCONATE 4 % EX LIQD: 60.0000 mL | Freq: Once | CUTANEOUS | Status: DC

## 2019-05-03 MED ORDER — SODIUM CHLORIDE 0.9 % IV SOLN: 250.0000 mL | INTRAVENOUS | Status: DC | PRN

## 2019-05-03 MED ORDER — FENTANYL CITRATE (PF) 100 MCG/2ML IJ SOLN
INTRAMUSCULAR | Status: DC | PRN
  Administered 2019-05-03 (×3): 12.5 ug via INTRAVENOUS

## 2019-05-03 MED ORDER — SPIRONOLACTONE 25 MG PO TABS
25.0000 mg | ORAL_TABLET | Freq: Every day | ORAL | Status: DC
  Administered 2019-05-04: 25 mg via ORAL
  Filled 2019-05-03: qty 1

## 2019-05-03 MED ORDER — INSULIN ASPART 100 UNIT/ML ~~LOC~~ SOLN
10.0000 [IU] | Freq: Three times a day (TID) | SUBCUTANEOUS | Status: DC
  Administered 2019-05-03 – 2019-05-04 (×2): 10 [IU] via SUBCUTANEOUS

## 2019-05-03 MED ORDER — LIDOCAINE HCL (PF) 1 % IJ SOLN
INTRAMUSCULAR | Status: AC
  Filled 2019-05-03: qty 60

## 2019-05-03 MED ORDER — LEVOTHYROXINE SODIUM 25 MCG PO TABS
25.0000 ug | ORAL_TABLET | Freq: Every day | ORAL | Status: DC
  Administered 2019-05-04: 06:00:00 25 ug via ORAL
  Filled 2019-05-03: qty 1

## 2019-05-03 SURGICAL SUPPLY — 19 items
ADAPTER SEALING SSA-EW-09 (MISCELLANEOUS) ×2 IMPLANT
ADPR INTRO LNG 9FR SL XTD WNG (MISCELLANEOUS) ×1
ASSURA CRTD CD3369-40Q (ICD Generator) ×3 IMPLANT
CABLE SURGICAL S-101-97-12 (CABLE) ×3 IMPLANT
CATH ATTAIN COM SURV 6250V-MB2 (CATHETERS) ×2 IMPLANT
CATH HEX JOS 2-5-2 65CM 6F REP (CATHETERS) ×2 IMPLANT
DEFIB ASSURA MULTI-CHMBR CRT-D (ICD Generator) IMPLANT
KIT ESSENTIALS PG (KITS) ×2 IMPLANT
LEAD DURATA 7122Q-65CM (Lead) ×2 IMPLANT
LEAD QUARTET 1458Q-86CM (Lead) ×2 IMPLANT
LEAD TENDRIL MRI 52CM LPA1200M (Lead) ×2 IMPLANT
PAD PRO RADIOLUCENT 2001M-C (PAD) ×3 IMPLANT
SHEATH CLASSIC 7F (SHEATH) ×2 IMPLANT
SHEATH CLASSIC 8F (SHEATH) ×2 IMPLANT
SHEATH CLASSIC 9.5F (SHEATH) ×2 IMPLANT
SHEATH PINNACLE 6F 10CM (SHEATH) ×2 IMPLANT
SLITTER 6232ADJ (MISCELLANEOUS) ×2 IMPLANT
TRAY PACEMAKER INSERTION (PACKS) ×3 IMPLANT
WIRE ACUITY WHISPER EDS 4648 (WIRE) ×2 IMPLANT

## 2019-05-03 NOTE — Discharge Summary (Addendum)
ELECTROPHYSIOLOGY PROCEDURE DISCHARGE SUMMARY    Patient ID: [name],  MRN: [id number], DOB/AGE: Jan 23, 1951 68 y.o.  Admit date: 05/03/2019 Discharge date: 05/04/2019  Primary Care Physician: [name], MD  Primary Cardiologist: Dr. Harl Bowie Electrophysiologist: Dr. Rayann Heman  Primary Discharge Diagnosis:  1. NICM 2. LBBB  Secondary Discharge Diagnosis:  1. DM 2. HTN 3. Obesity 4. TTP 5. Hypothyroidism 6. OSA  Allergies  Allergen Reactions  . Other Other (See Comments)    FLAGYL, CIPRO, PROTONIX taken at the same time caused patient to lose her breath and have trouble breathing, requiring hospital stay at [facility], 03/23/14-per patient.   . Lisinopril Cough     Procedures This Admission:  1.  Implantation of a CRT-D on 05/03/2019 by Dr Rayann Heman.  The patient received a [place] [phone] (serial  Number [id number]) biventricular ICD,  [employer] Tendril MRI model LPA1200M-  52 (serial number  [id number]) right atrial lead and a [employer] [place], model 7122Q-65 (serial number [id number]) right ventricular defibrillator lead, [place]- 86 (serial number [id number]) lead (LV) DFT's were deferred at time of implant.   There were no immediate post procedure complications. 2.  CXR on 05/04/2019 demonstrated no pneumothorax status post device implantation.   Brief HPI: [name] is a 68 y.o. female was referred to electrophysiology in the outpatient setting for consideration of ICD implantation.  Past medical history includes above.  The patient has persistent LV dysfunction despite guideline directed therapy.  Risks, benefits, and alternatives to ICD implantation were reviewed with the patient who wished to proceed.   Hospital Course:  The patient was admitted and underwent implantation of a CRT-D with details as outlined above. She was monitored on telemetry overnight which demonstrated 05/04/2019.  Left chest was  without hematoma or ecchymosis.  The device was interrogated and found to be functioning normally.  CXR was obtained and demonstrated no pneumothorax status post device implantation.  Wound care, arm mobility, and restrictions were reviewed with the patient.  The patient is a little nauseous this AM after Vicodin, she reports this is very typical for her, no vomiting, and feels ready for discharge, she feels well otherwise no CP or SOB/cough, pulmonary exam is normal.   She is encouraged OOB.  She was examined by Dr. Rayann Heman and considered stable for discharge to home.   The patient's discharge medications include an ACE/ARB (losartan) and beta blocker (carvedilol).   Physical Exam: Vitals:   05/03/19 2354 05/04/19 0426 05/04/19 0429 05/04/19 0837  BP: 116/62  117/72 126/86  Pulse: 82  86 71  Resp: 20  16 16   Temp: 98.2 F (36.8 C)  98.3 F (36.8 C) (!) 97.5 F (36.4 C)  TempSrc: Oral  Oral Axillary  SpO2: 94%  95% 93%  Weight:  112.2 kg    Height:        GEN- The patient is well appearing, alert and oriented x 3 today.   HEENT: normocephalic, atraumatic; sclera clear, conjunctiva pink; hearing intact Lungs- CTA b/l, normal work of breathing.  No wheezes, rales, rhonchi Heart- RRR, no murmurs, rubs or gallops, PMI not laterally displaced GI- soft, non-tender, non-distended Extremities- no clubbing, cyanosis, or edema MS- no significant deformity or atrophy Skin- warm and dry, no rash or lesion, left chest without hematoma/ecchymosis Psych- euthymic mood, full affect Neuro- no gross defecits  Labs:   Lab Results  Component Value Date   WBC 5.3  04/30/2019   HGB 11.2 (L) 04/30/2019   HCT 34.7 (L) 04/30/2019   MCV 85.9 04/30/2019   PLT 170 04/30/2019    Recent Labs  Lab 04/30/19 1403  NA 137  K 4.1  CL 104  CO2 25  BUN 19  CREATININE 0.96  CALCIUM 9.0  GLUCOSE 86    Discharge Medications:  Allergies as of 05/04/2019      Reactions   Other Other (See Comments)    FLAGYL, CIPRO, PROTONIX taken at the same time caused patient to lose her breath and have trouble breathing, requiring hospital stay at [facility], 03/23/14-per patient.    Lisinopril Cough      Medication List    TAKE these medications   acetaminophen 500 MG tablet Commonly known as: TYLENOL Take 1,000 mg by mouth every 6 (six) hours as needed for moderate pain.   aspirin 81 MG chewable tablet Chew 1 tablet (81 mg total) by mouth daily.   atorvastatin 80 MG tablet Commonly known as: LIPITOR Take 1 tablet (80 mg total) by mouth daily.   carvedilol 25 MG tablet Commonly known as: COREG Take 25 mg by mouth 2 (two) times daily with a meal.   Fish Oil 1000 MG Cpdr Take 1 capsule by mouth daily.   furosemide 20 MG tablet Commonly known as: LASIX Take 20 mg by mouth daily.   insulin glargine 100 UNIT/ML injection Commonly known as: LANTUS Inject 40 Units into the skin 2 (two) times daily.   levothyroxine 25 MCG tablet Commonly known as: SYNTHROID Take 25 mcg by mouth daily before breakfast.   losartan 25 MG tablet Commonly known as: COZAAR Take 1 tablet (25 mg total) by mouth daily.   multivitamin tablet Take 1 tablet by mouth daily.   NovoLOG 100 UNIT/ML injection Generic drug: insulin aspart Inject 10 Units into the skin 3 (three) times daily with meals.   polyethylene glycol 17 g packet Commonly known as: MIRALAX / GLYCOLAX Take 17 g by mouth daily.   spironolactone 25 MG tablet Commonly known as: ALDACTONE Take 1 tablet (25 mg total) by mouth daily.       Disposition:  Home  Discharge Instructions    Diet - low sodium heart healthy   Complete by: As directed    Increase activity slowly   Complete by: As directed      Follow-up Information    [facility].   Specialty: Cardiology Why: 05/15/2019 @ 2:00PM, wound check visit Contact information: [address], [place]        Thompson Grayer, MD Follow up.   Specialty: Cardiology Why: 08/03/2019 @ 2:00PM Contact information: [place] [address] [phone]           Duration of Discharge Encounter: Greater than 30 minutes including physician time.  Venetia Night, PA-C 05/04/2019 8:54 AM

## 2019-05-03 NOTE — H&P (Signed)
Chief Complaint:  CHF  History of Present Illness:    JOLAINE FRYBERGER is a 68 y.o. female who presents for BiV ICDimplantation.  She has a longstanding NICM and LBBB despite guideline directed medical therapy. she has NYHA Class III CHF with SOB with moderate activity.  Today, she denies symptoms of palpitations, chest pain,  lower extremity edema, dizziness, presyncope, or syncope.  The patient is otherwise without complaint today.  The patient denies symptoms of fevers, chills, cough, or new SOB worrisome for COVID 19.      Past Medical History:  Diagnosis Date  . Anemia   . Arthritis   . CAD (coronary artery disease)    a. LHC (03/2014) [place]: nl, LAD: diff dz proximal 20%, 30-40% dz mid vessel prior 2nd diagonal, LCx: 40% in OM1, 20-30% in OM2, RCA: 99% subtotal occlusion @ crux, TIMI 1 flow, L-R collaterals to distal vessel  . Chronic systolic heart failure ([place])    a. ECHO (03/2014): EF 25-30%, akinesis enteroanteroseptal myocardium, grade III DD b. RHC (03/2014) RA 4, RV 42/4, PA 44/14 (26), PCWP 21, PA 62% Fick CO/CI 6.9 / 3.4  . Diabetes mellitus   . Duodenal ulcer    Age 9  . Erosive esophagitis   . Gastritis   . Hypertension   . Ischemic cardiomyopathy   . Obese   . Short-term memory loss   . Thyroid disease   . TTP (thrombotic thrombocytopenic purpura) (HCC)          Past Surgical History:  Procedure Laterality Date  . COLONOSCOPY  08/29/2012   HEN:IDPOEUM diverticulosis  . COLONOSCOPY N/A 05/28/2015   Procedure: COLONOSCOPY;  Surgeon: Daneil Dolin, MD;  Location: [facility];  Service: Endoscopy;  Laterality: N/A;  1015  . ECTOPIC PREGNANCY SURGERY    . ESOPHAGOGASTRODUODENOSCOPY  04/20/2012   PNT:IRWERXV antral gastritis/Erosive reflux esophagitis  . LEFT AND RIGHT HEART CATHETERIZATION WITH CORONARY ANGIOGRAM N/A 03/28/2014   Procedure: LEFT AND RIGHT HEART CATHETERIZATION WITH CORONARY ANGIOGRAM;  Surgeon: Peter M Martinique, MD;   Location: [facility] C[facility] LAB;  Service: Cardiovascular;  Laterality: N/A;  . SPLENECTOMY, PARTIAL            Current Outpatient Medications  Medication Sig Dispense Refill  . acetaminophen (TYLENOL) 500 MG tablet Take 1,000 mg by mouth every 6 (six) hours as needed for moderate pain.     Marland Kitchen aspirin 81 MG chewable tablet Chew 1 tablet (81 mg total) by mouth daily.    Marland Kitchen atorvastatin (LIPITOR) 80 MG tablet Take 80 mg by mouth daily.    . carvedilol (COREG) 25 MG tablet Take 25 mg by mouth 2 (two) times daily with a meal.    . furosemide (LASIX) 20 MG tablet Take 20 mg by mouth daily.    . insulin glargine (LANTUS) 100 UNIT/ML injection Inject 40 Units into the skin 2 (two) times daily.    Marland Kitchen levothyroxine (SYNTHROID, LEVOTHROID) 25 MCG tablet Take 25 mcg by mouth daily before breakfast.    . losartan (COZAAR) 25 MG tablet Take 1 tablet (25 mg total) by mouth daily. 90 tablet 1  . Multiple Vitamin (MULTIVITAMIN) tablet Take 1 tablet by mouth daily.    Marland Kitchen NOVOLOG 100 UNIT/ML injection Inject 10 Units into the skin 3 (three) times daily with meals.     . Omega-3 Fatty Acids (FISH OIL) 1000 MG CPDR Take 1 capsule by mouth daily.     . polyethylene glycol (MIRALAX / GLYCOLAX) packet Take 17  g by mouth daily.    Marland Kitchen spironolactone (ALDACTONE) 25 MG tablet Take 25 mg by mouth daily.     No current facility-administered medications for this visit.     Allergies:   Other and Lisinopril   Social History:  The patient  reports that she quit smoking about 40 years ago. Her smoking use included cigarettes. She started smoking about 52 years ago. She has a 2.50 pack-year smoking history. She has never used smokeless tobacco. She reports that she does not drink alcohol or use drugs.   Family History:  The patient's  family history includes Cirrhosis in her father; Diabetes in her daughter; Heart failure in her mother.   ROS:  Please see the history of present illness.   All other  systems are personally reviewed and negative.   Physical Exam: Vitals:   05/03/19 1058  BP: 121/72  Pulse: 71  Resp: 16  Temp: (!) 96.3 F (35.7 C)  TempSrc: Skin  SpO2: 100%  Weight: 108.9 kg  Height: 5\' 5"  (1.651 m)    GEN- The patient is well appearing, alert and oriented x 3 today.   Head- normocephalic, atraumatic Eyes-  Sclera clear, conjunctiva pink Ears- hearing intact Oropharynx- clear Neck- supple, Lungs- Clear to ausculation bilaterally, normal work of breathing Heart- Regular rate and rhythm, no murmurs, rubs or gallops, PMI not laterally displaced GI- soft, NT, ND, + BS Extremities- no clubbing, cyanosis, or edema, groin is without hematoma/ bruit MS- no significant deformity or atrophy Skin- no rash or lesion Psych- euthymic mood, full affect Neuro- strength and sensation are intact   Labs/Other Tests and Data Reviewed:    Recent Labs: No results found for requested labs within last 8760 hours.      Wt Readings from Last 3 Encounters:  03/02/19 243 lb (110.2 kg)  02/20/19 242 lb (109.8 kg)  11/14/18 238 lb (108 kg)      ASSESSMENT & PLAN:    1.  Longstanding mixed cardiomyopathy/ LBBB She has a longstanding cardiomyopathy despite guideline directed medical therapy. She also has a LBBB and would be expected to benefit from CRT.   ICD Criteria  Current LVEF:30%. Within 12 months prior to implant: Yes   Heart failure history: Yes, Class III  Cardiomyopathy history: Yes, Non-Ischemic Cardiomyopathy.  Atrial Fibrillation/Atrial Flutter: No.  Ventricular tachycardia history: No.  Cardiac arrest history: No.  History of syndromes with risk of sudden death: No.  Previous ICD: No.  Current ICD indication: Primary  PPM indication: CRT   Class I or II Bradycardia indication present: Yes (CRT)  Beta Blocker therapy for 3 or more months: Yes, prescribed.   Ace Inhibitor/ARB therapy for 3 or more months: Yes, prescribed.    I have  seen KREE ARMATO is a 68 y.o. female  Who presents for BiV ICD implant for primary prevention of sudden death.  The patient's chart has been reviewed and they meet criteria for BIV ICD implant.  I have had a thorough discussion with the patient reviewing options.  The patient has had opportunities to ask questions and have them answered. The patient and I have decided together through the [place] Support Tool to proceed with BiV ICD at this time.  Risks, benefits, alternatives to BiV ICD implantation were discussed in detail with the patient today. The patient  understands that the risks include but are not limited to bleeding, infection, pneumothorax, perforation, tamponade, vascular damage, renal failure, MI, stroke, death, inappropriate  shocks, and lead dislodgement and wishes to proceed at this time.  Thompson Grayer MD, [place] 05/03/2019 12:58 PM

## 2019-05-03 NOTE — Plan of Care (Signed)
  Problem: Education: Goal: Knowledge of cardiac device and self-care will improve Outcome: Progressing Goal: Ability to safely manage health related needs after discharge will improve Outcome: Progressing Goal: Individualized Educational Video(s) Outcome: Progressing   

## 2019-05-04 ENCOUNTER — Ambulatory Visit (HOSPITAL_COMMUNITY): Payer: Medicare HMO

## 2019-05-04 ENCOUNTER — Encounter (HOSPITAL_COMMUNITY): Payer: Self-pay | Admitting: Internal Medicine

## 2019-05-04 DIAGNOSIS — E669 Obesity, unspecified: Secondary | ICD-10-CM | POA: Diagnosis not present

## 2019-05-04 DIAGNOSIS — G4733 Obstructive sleep apnea (adult) (pediatric): Secondary | ICD-10-CM | POA: Diagnosis not present

## 2019-05-04 DIAGNOSIS — E119 Type 2 diabetes mellitus without complications: Secondary | ICD-10-CM | POA: Diagnosis not present

## 2019-05-04 DIAGNOSIS — Z006 Encounter for examination for normal comparison and control in clinical research program: Secondary | ICD-10-CM | POA: Diagnosis not present

## 2019-05-04 DIAGNOSIS — E039 Hypothyroidism, unspecified: Secondary | ICD-10-CM | POA: Diagnosis not present

## 2019-05-04 DIAGNOSIS — J90 Pleural effusion, not elsewhere classified: Secondary | ICD-10-CM | POA: Diagnosis not present

## 2019-05-04 DIAGNOSIS — J9811 Atelectasis: Secondary | ICD-10-CM | POA: Diagnosis not present

## 2019-05-04 DIAGNOSIS — I428 Other cardiomyopathies: Secondary | ICD-10-CM

## 2019-05-04 DIAGNOSIS — Z794 Long term (current) use of insulin: Secondary | ICD-10-CM | POA: Diagnosis not present

## 2019-05-04 DIAGNOSIS — Z7982 Long term (current) use of aspirin: Secondary | ICD-10-CM | POA: Diagnosis not present

## 2019-05-04 DIAGNOSIS — I447 Left bundle-branch block, unspecified: Secondary | ICD-10-CM | POA: Diagnosis not present

## 2019-05-04 LAB — GLUCOSE, CAPILLARY: Glucose-Capillary: 156 mg/dL — ABNORMAL HIGH (ref 70–99)

## 2019-05-04 MED ORDER — CARVEDILOL 25 MG PO TABS
25.0000 mg | ORAL_TABLET | Freq: Two times a day (BID) | ORAL | Status: DC
  Administered 2019-05-04: 25 mg via ORAL
  Filled 2019-05-04: qty 1

## 2019-05-04 NOTE — Discharge Instructions (Signed)
° ° °  Supplemental Discharge Instructions for  Pacemaker/Defibrillator Patients  Activity No heavy lifting or vigorous activity with your left/right arm for 6 to 8 weeks.  Do not raise your left/right arm above your head for one week.  Gradually raise your affected arm as drawn below.             05/07/2019                 05/08/2019                 05/09/2019              05/10/2019 __  NO DRIVING patient reports she does not drive  WOUND CARE - Keep the wound area clean and dry.  Do not get this area wet, no showersuntil cleared to at your wound check visit - The tape/steri-strips on your wound will fall off; do not pull them off.  No bandage is needed on the site.  DO  NOT apply any creams, oils, or ointments to the wound area. - If you notice any drainage or discharge from the wound, any swelling or bruising at the site, or you develop a fever > 101? F after you are discharged home, call the office at once.  Special Instructions - You are still able to use cellular telephones; use the ear opposite the side where you have your pacemaker/defibrillator.  Avoid carrying your cellular phone near your device. - When traveling through airports, show security personnel your identification card to avoid being screened in the metal detectors.  Ask the security personnel to use the hand wand. - Avoid arc welding equipment, MRI testing (magnetic resonance imaging), TENS units (transcutaneous nerve stimulators).  Call the office for questions about other devices. - Avoid electrical appliances that are in poor condition or are not properly grounded. - Microwave ovens are safe to be near or to operate.

## 2019-05-04 NOTE — Progress Notes (Signed)
Doing well today.  No concerns  CXR reveals stable leads, no ptx.  Awaiting device interrogation.  Anticipate discharge to home this am.  Thompson Grayer MD, [place] 05/04/2019 6:51 AM

## 2019-05-10 DIAGNOSIS — I2510 Atherosclerotic heart disease of native coronary artery without angina pectoris: Secondary | ICD-10-CM | POA: Diagnosis not present

## 2019-05-10 DIAGNOSIS — I5042 Chronic combined systolic (congestive) and diastolic (congestive) heart failure: Secondary | ICD-10-CM | POA: Diagnosis not present

## 2019-05-10 DIAGNOSIS — I10 Essential (primary) hypertension: Secondary | ICD-10-CM | POA: Diagnosis not present

## 2019-05-10 DIAGNOSIS — E1165 Type 2 diabetes mellitus with hyperglycemia: Secondary | ICD-10-CM | POA: Diagnosis not present

## 2019-05-14 ENCOUNTER — Telehealth: Payer: Self-pay | Admitting: Internal Medicine

## 2019-05-14 NOTE — Telephone Encounter (Signed)
Patient will need her son to be in the appointment with her tomorrow. She has limited mobility and can get around slowly with a cane. Her son will be driving her, and it will be easier to have her Son push her in a wheelchair. Please advise the patient as to whether or not her son will be allowed to be in the room.

## 2019-05-14 NOTE — Telephone Encounter (Signed)
    COVID-19 Pre-Screening Questions:  . In the past 7 to 10 days have you had a cough,  shortness of breath, headache, congestion, fever (100 or greater) body aches, chills, sore throat, or sudden loss of taste or sense of smell? No . Have you been around anyone with known Covid 19. No . Have you been around anyone who is awaiting Covid 19 test results in the past 7 to 10 days? No . Have you been around anyone who has been exposed to Covid 19, or has mentioned symptoms of Covid 19 within the past 7 to 10 days? No  If you have any concerns/questions about symptoms patients report during screening (either on the phone or at threshold). Contact the provider seeing the patient or DOD for further guidance.  If neither are available contact a member of the leadership team.          The pt answered no to all covid-19 prescreening questions. I told her that both her and her son would have to wear a mask. I told her if anything changes between now and her appointment time to please give [place] a call. The pt verbalized understanding.

## 2019-05-15 ENCOUNTER — Ambulatory Visit (INDEPENDENT_AMBULATORY_CARE_PROVIDER_SITE_OTHER): Payer: Medicare HMO | Admitting: *Deleted

## 2019-05-15 ENCOUNTER — Other Ambulatory Visit: Payer: Self-pay

## 2019-05-15 DIAGNOSIS — I255 Ischemic cardiomyopathy: Secondary | ICD-10-CM | POA: Diagnosis not present

## 2019-05-16 LAB — CUP PACEART INCLINIC DEVICE CHECK
Battery Remaining Longevity: 64 mo
Brady Statistic RA Percent Paced: 0.01 %
Brady Statistic RV Percent Paced: 99.3 %
Date Time Interrogation Session: 20200825180917
HighPow Impedance: 55.125
Implantable Lead Implant Date: 20200813
Implantable Lead Implant Date: 20200813
Implantable Lead Implant Date: 20200813
Implantable Lead Location: 753858
Implantable Lead Location: 753859
Implantable Lead Location: 753860
Implantable Pulse Generator Implant Date: 20200813
Lead Channel Impedance Value: 450 Ohm
Lead Channel Impedance Value: 487.5 Ohm
Lead Channel Impedance Value: 512.5 Ohm
Lead Channel Pacing Threshold Amplitude: 0.5 V
Lead Channel Pacing Threshold Amplitude: 0.625 V
Lead Channel Pacing Threshold Amplitude: 1.625 V
Lead Channel Pacing Threshold Pulse Width: 0.4 ms
Lead Channel Pacing Threshold Pulse Width: 0.5 ms
Lead Channel Pacing Threshold Pulse Width: 0.6 ms
Lead Channel Sensing Intrinsic Amplitude: 11.8 mV
Lead Channel Sensing Intrinsic Amplitude: 5 mV
Lead Channel Setting Pacing Amplitude: 2 V
Lead Channel Setting Pacing Amplitude: 2.625
Lead Channel Setting Pacing Amplitude: 3.5 V
Lead Channel Setting Pacing Pulse Width: 0.5 ms
Lead Channel Setting Pacing Pulse Width: 0.6 ms
Lead Channel Setting Sensing Sensitivity: 0.5 mV
Pulse Gen Serial Number: 9891600

## 2019-05-16 NOTE — Progress Notes (Signed)
Wound check appointment. Steri-strips removed. Wound without redness or edema. Incision edges approximated, wound well healed. Normal device function. Thresholds, sensing, and impedances consistent with implant measurements. Device programmed at 3.5V for extra safety margin until 3 month visit. Histogram distribution appropriate for patient and level of activity. No mode switches or ventricular arrhythmias noted. Patient educated about wound care, arm mobility, lifting restrictions, shock plan. pt enrolled in remote f/u, next remote 08/03/19.

## 2019-05-22 ENCOUNTER — Telehealth: Payer: Self-pay | Admitting: Cardiology

## 2019-05-22 NOTE — Telephone Encounter (Signed)
Virtual Visit Pre-Appointment Phone Call  "(Name), I am calling you today to discuss your upcoming appointment. We are currently trying to limit exposure to the virus that causes COVID-19 by seeing patients at home rather than in the office."  "What is the BEST phone number to call the day of the visit?" -[phone] 1. Do you have or have access to (through a family member/friend) a smartphone with video capability that we can use for your visit?" a. If yes - list this number in appt notes as cell (if different from BEST phone #) and list the appointment type as a VIDEO visit in appointment notes b. If no - list the appointment type as a PHONE visit in appointment notes  2. Confirm consent - "In the setting of the current Covid19 crisis, you are scheduled for a (phone or video) visit with your provider on (date) at (time).  Just as we do with many in-office visits, in order for you to participate in this visit, we must obtain consent.  If you'd like, I can send this to your mychart (if signed up) or email for you to review.  Otherwise, I can obtain your verbal consent now.  All virtual visits are billed to your insurance company just like a normal visit would be.  By agreeing to a virtual visit, we'd like you to understand that the technology does not allow for your provider to perform an examination, and thus may limit your provider's ability to fully assess your condition. If your provider identifies any concerns that need to be evaluated in person, we will make arrangements to do so.  Finally, though the technology is pretty good, we cannot assure that it will always work on either your or our end, and in the setting of a video visit, we may have to convert it to a phone-only visit.  In either situation, we cannot ensure that we have a secure connection.  Are you willing to proceed?" STAFF: Did the patient verbally acknowledge consent to telehealth visit? Document YES/NO here:   YES    3. Advise patient to be prepared - "Two hours prior to your appointment, go ahead and check your blood pressure, pulse, oxygen saturation, and your weight (if you have the equipment to check those) and write them all down. When your visit starts, your provider will ask you for this information. If you have an Apple Watch or Kardia device, please plan to have heart rate information ready on the day of your appointment. Please have a pen and paper handy nearby the day of the visit as well."  4. Give patient instructions for MyChart download to smartphone OR Doximity/Doxy.me as below if video visit (depending on what platform provider is using)  5. Inform patient they will receive a phone call 15 minutes prior to their appointment time (may be from unknown caller ID) so they should be prepared to answer    TELEPHONE CALL NOTE  [name] has been deemed a candidate for a follow-up tele-health visit to limit community exposure during the Covid-19 pandemic. I spoke with the patient via phone to ensure availability of phone/video source, confirm preferred email & phone number, and discuss instructions and expectations.  I reminded [name] to be prepared with any vital sign and/or heart rhythm information that could potentially be obtained via home monitoring, at the time of her visit. I reminded [name] to expect a phone call prior to her visit.  Vicky  T Slaughter 05/22/2019 1:44 PM   INSTRUCTIONS FOR DOWNLOADING THE MYCHART APP TO SMARTPHONE  - The patient must first make sure to have activated MyChart and know their login information - If Apple, go to [employer] and type in MyChart in the search bar and download the app. If Android, ask patient to go to [employer] and type in [place] in the search bar and download the app. The app is free but as with any other app downloads, their phone may require them to verify saved payment information or Apple/Android password.  -  The patient will need to then log into the app with their MyChart username and password, and select [facility] as their healthcare provider to link the account. When it is time for your visit, go to the MyChart app, find appointments, and click Begin Video Visit. Be sure to Select Allow for your device to access the Microphone and Camera for your visit. You will then be connected, and your provider will be with you shortly.  **If they have any issues connecting, or need assistance please contact MyChart service desk [phone])**  **If using a computer, in order to ensure the best quality for their visit they will need to use either of the following Internet Browsers: [employer], or Google Chrome**  IF USING DOXIMITY or DOXY.ME - The patient will receive a link just prior to their visit by text.     FULL LENGTH CONSENT FOR TELE-HEALTH VISIT   I hereby voluntarily request, consent and authorize [facility] and its employed or contracted physicians, physician assistants, nurse practitioners or other licensed health care professionals (the Practitioner), to provide me with telemedicine health care services (the Services") as deemed necessary by the treating Practitioner. I acknowledge and consent to receive the Services by the Practitioner via telemedicine. I understand that the telemedicine visit will involve communicating with the Practitioner through live audiovisual communication technology and the disclosure of certain medical information by electronic transmission. I acknowledge that I have been given the opportunity to request an in-person assessment or other available alternative prior to the telemedicine visit and am voluntarily participating in the telemedicine visit.  I understand that I have the right to withhold or withdraw my consent to the use of telemedicine in the course of my care at any time, without affecting my right to future care or treatment, and that the  Practitioner or I may terminate the telemedicine visit at any time. I understand that I have the right to inspect all information obtained and/or recorded in the course of the telemedicine visit and may receive copies of available information for a reasonable fee.  I understand that some of the potential risks of receiving the Services via telemedicine include:   Delay or interruption in medical evaluation due to technological equipment failure or disruption;  Information transmitted may not be sufficient (e.g. poor resolution of images) to allow for appropriate medical decision making by the Practitioner; and/or   In rare instances, security protocols could fail, causing a breach of personal health information.  Furthermore, I acknowledge that it is my responsibility to provide information about my medical history, conditions and care that is complete and accurate to the best of my ability. I acknowledge that Practitioner's advice, recommendations, and/or decision may be based on factors not within their control, such as incomplete or inaccurate data provided by me or distortions of diagnostic images or specimens that may result from electronic transmissions. I understand that the practice  of medicine is not an Chief Strategy Officer and that Practitioner makes no warranties or guarantees regarding treatment outcomes. I acknowledge that I will receive a copy of this consent concurrently upon execution via email to the email address I last provided but may also request a printed copy by calling the office of [facility].    I understand that my insurance will be billed for this visit.   I have read or had this consent read to me.  I understand the contents of this consent, which adequately explains the benefits and risks of the Services being provided via telemedicine.   I have been provided ample opportunity to ask questions regarding this consent and the Services and have had my questions answered to my  satisfaction.  I give my informed consent for the services to be provided through the use of telemedicine in my medical care  By participating in this telemedicine visit I agree to the above.

## 2019-05-24 ENCOUNTER — Telehealth (INDEPENDENT_AMBULATORY_CARE_PROVIDER_SITE_OTHER): Payer: Medicare HMO | Admitting: Cardiology

## 2019-05-24 ENCOUNTER — Encounter: Payer: Self-pay | Admitting: Cardiology

## 2019-05-24 VITALS — BP 137/83 | HR 73 | Ht 65.0 in | Wt 246.0 lb

## 2019-05-24 DIAGNOSIS — I2510 Atherosclerotic heart disease of native coronary artery without angina pectoris: Secondary | ICD-10-CM

## 2019-05-24 DIAGNOSIS — I5022 Chronic systolic (congestive) heart failure: Secondary | ICD-10-CM | POA: Diagnosis not present

## 2019-05-24 DIAGNOSIS — E782 Mixed hyperlipidemia: Secondary | ICD-10-CM

## 2019-05-24 NOTE — Progress Notes (Signed)
Virtual Visit via Telephone Note   This visit type was conducted due to national recommendations for restrictions regarding the COVID-19 Pandemic (e.g. social distancing) in an effort to limit this patient's exposure and mitigate transmission in our community.  Due to her co-morbid illnesses, this patient is at least at moderate risk for complications without adequate follow up.  This format is felt to be most appropriate for this patient at this time.  The patient did not have access to video technology/had technical difficulties with video requiring transitioning to audio format only (telephone).  All issues noted in this document were discussed and addressed.  No physical exam could be performed with this format.  Please refer to the patient's chart for her  consent to telehealth for [facility].   Date:  05/24/2019   ID:  [name], DOB July 04, 1951, MRN [id number]  Patient Location: Home Provider Location: Office  PCP:  [name], MD  Cardiologist:  [name], MD  Electrophysiologist:  None   Evaluation Performed:  Follow-Up Visit  Chief Complaint:  Follow up visit.   History of Present Illness:    [name] is a 68 y.o. female seen today for follow up of the following meidcal problems.   1. Chronic combined systolicand diastolic heart failure - echo 03/2014 LVEF 25-30%, restrictive diastolic dysfunction. New diagnosis at that time. Repeat echo 06/2014 LVEF 40-45% - cath 03/2014 showed RCA 99% with left to right collaterals, other arteries patent. Overall LV systolic dysfunction out of proportion to CAD. Medically managed.  - 07/2015 echo LVEF [id number], grade I diastolic dysfunction - Entresto causes dizziness and nausea, stopped taking. Back on losartan -  due to low bp's we lowered losartan to 25mg  daily. Lowered lasix to 20mg  daily, may take 40mg  as needed.   01/2018 echo LVEF 35%, grade I diastoilc dysfunction - 11/2018 echo LVEF 30-35% - 05/03/19  had BIV AICD placed by Dr Rayann Heman  - no recent SOB or DOE. No LE edema  2. CAD - cath 03/2014 with 99% chronic RCA disease, otherwise patent vessels. Managed medically  - no recent chest pain.   3. OSA  - severe OSA by sleep study 04/2016 - she reports issues getting her CPAP, has not gotten order yet.  - has not been able to follow with Dr Luan Pulling to have her CPAP arranged, reports she has called several times over several months  - upcoming appt with Dr Radford Pax next month  4. Hyperlipidemia -remains compliant with statin  5. Chronic LBBB    The patient does not have symptoms concerning for COVID-19 infection (fever, chills, cough, or new shortness of breath).    Past Medical History:  Diagnosis Date   Anemia    Arthritis    CAD (coronary artery disease)    a. LHC (03/2014) Lmain: nl, LAD: diff dz proximal 20%, 30-40% dz mid vessel prior 2nd diagonal, LCx: 40% in OM1, 20-30% in OM2, RCA: 99% subtotal occlusion @ crux, TIMI 1 flow, L-R collaterals to distal vessel   Chronic systolic heart failure ([place])    a. ECHO (03/2014): EF 25-30%, akinesis enteroanteroseptal myocardium, grade III DD b. RHC (03/2014) RA 4, RV 42/4, PA 44/14 (26), PCWP 21, PA 62% Fick CO/CI 6.9 / 3.4   Diabetes mellitus    Duodenal ulcer    Age 68   Erosive esophagitis    Gastritis    Hypertension    Ischemic cardiomyopathy    Obese    Short-term  memory loss    Thyroid disease    TTP (thrombotic thrombocytopenic purpura) (HCC)    Past Surgical History:  Procedure Laterality Date   BIV ICD INSERTION CRT-D N/A 05/03/2019   Procedure: BIV ICD INSERTION CRT-D;  Surgeon: Thompson Grayer, MD;  Location: [facility] CV LAB;  Service: Cardiovascular;  Laterality: N/A;   COLONOSCOPY  08/29/2012   [id number] diverticulosis   COLONOSCOPY N/A 05/28/2015   Procedure: COLONOSCOPY;  Surgeon: Daneil Dolin, MD;  Location: [facility];  Service: Endoscopy;  Laterality: N/A;  1015   ECTOPIC  PREGNANCY SURGERY     ESOPHAGOGASTRODUODENOSCOPY  04/20/2012   [id number] antral gastritis/Erosive reflux esophagitis   LEFT AND RIGHT HEART CATHETERIZATION WITH CORONARY ANGIOGRAM N/A 03/28/2014   Procedure: LEFT AND RIGHT HEART CATHETERIZATION WITH CORONARY ANGIOGRAM;  Surgeon: Peter M Martinique, MD;  Location: [facility] CATH LAB;  Service: Cardiovascular;  Laterality: N/A;   SPLENECTOMY, PARTIAL       Current Meds  Medication Sig   acetaminophen (TYLENOL) 500 MG tablet Take 1,000 mg by mouth every 6 (six) hours as needed for moderate pain.    aspirin 81 MG chewable tablet Chew 1 tablet (81 mg total) by mouth daily.   atorvastatin (LIPITOR) 80 MG tablet Take 1 tablet (80 mg total) by mouth daily.   carvedilol (COREG) 25 MG tablet Take 25 mg by mouth 2 (two) times daily with a meal.   furosemide (LASIX) 20 MG tablet Take 20 mg by mouth daily.   insulin glargine (LANTUS) 100 UNIT/ML injection Inject 40 Units into the skin 2 (two) times daily.   levothyroxine (SYNTHROID, LEVOTHROID) 25 MCG tablet Take 25 mcg by mouth daily before breakfast.   losartan (COZAAR) 25 MG tablet Take 1 tablet (25 mg total) by mouth daily.   Multiple Vitamin (MULTIVITAMIN) tablet Take 1 tablet by mouth daily.   NOVOLOG 100 UNIT/ML injection Inject 10 Units into the skin 3 (three) times daily with meals.    Omega-3 Fatty Acids (FISH OIL) 1000 MG CPDR Take 1 capsule by mouth daily.    polyethylene glycol (MIRALAX / GLYCOLAX) packet Take 17 g by mouth daily.   spironolactone (ALDACTONE) 25 MG tablet Take 1 tablet (25 mg total) by mouth daily.     Allergies:   Other and Lisinopril   Social History   Tobacco Use   Smoking status: Former Smoker    Packs/day: 0.25    Years: 10.00    Pack years: 2.50    Types: Cigarettes    Start date: 09/20/1966    Quit date: 09/20/1978    Years since quitting: 40.7   Smokeless tobacco: Never Used   Tobacco comment: Quit 2008  Substance Use Topics   Alcohol use: No     Alcohol/week: 0.0 standard drinks   Drug use: No     Family Hx: The patient's family history includes Cirrhosis in her father; Diabetes in her daughter; Heart failure in her mother. There is no history of Colon cancer.  ROS:   Please see the history of present illness.     All other systems reviewed and are negative.   Prior CV studies:   The following studies were reviewed today:  03/2014 echo Study Conclusions  - Left ventricle: The cavity size was normal. There was mild focal basal hypertrophy of the septum. Systolic function was severely reduced. The estimated ejection fraction was in the range of 25% to 30%. There is akinesis of the entireanteroseptal myocardium. Doppler parameters are consistent with a  reversible restrictive pattern, indicative of decreased left ventricular diastolic compliance and/or increased left atrial pressure (grade 3 diastolic dysfunction). - Mitral valve: There was mild regurgitation. - Left atrium: The atrium was moderately dilated. 03/2014 Cath Procedural Findings:  Hemodynamics  RA 8/0 mean 4 mm Hg  RV 42/4 mm Hg  PA 44/14 mean 26 mm Hg  PCWP 20/28 mean 21 mm Hg  LV 98/18 mm Hg  AO 95/54 mean 72 mm Hg  Oxygen saturations:  PA 62%  AO 90%  Cardiac Output (Fick) 6.9 L/min  Cardiac Index (Fick) 3.4 L/min/meter squared  Coronary angiography:  Coronary dominance: right  Left mainstem: Normal  Left anterior descending (LAD): Diffuse disease in the proximal vessel up to 20%. 30-40% disease in the mid vessel prior to the second diagonal.  Left circumflex (LCx): 40% in OM1. 20-30% in OM2. Otherwise no significant disease.  Right coronary artery (RCA): 99% subtotal occlusion at the crux. TIMI 1 flow. Left to right collaterals to the distal vessel.  Left ventriculography: Left ventricular systolic function is abnormal, LVEF is estimated at 25%. There is inferior akinesis and severe global hypokinesis. There is mild mitral  regurgitation  Final Conclusions:  1. Single vessel obstructive CAD with subtotal occlusion of the RCA at the crux.  2. Severe LV dysfunction.  3. Normal Right heart pressures. Elevated PCWP.  Recommendations: Medical management. Her degree of LV dysfunction is out of proportion to her CAD. She has no anginal symptoms so PCI of RCA not indicated. The inferior wall appears scarred.  07/2015 echo Study Conclusions  - Left ventricle: The cavity size was at the upper limits of normal. Wall thickness was increased in a pattern of moderate LVH. The estimated ejection fraction was 40% (similar calculation per biplane speckle tracking, reduced global longitudinal strain of -14.3%). There is akinesis of the basalinferolateral and inferior myocardium. Doppler parameters are consistent with abnormal left ventricular relaxation (grade 1 diastolic dysfunction). - Aortic valve: Mildly calcified annulus. Trileaflet. - Mitral valve: Calcified annulus. There was trivial regurgitation. - Left atrium: The atrium was at the upper limits of normal in size. - Right atrium: Central venous pressure (est): 3 mm Hg. - Atrial septum: A patent foramen ovale cannot be excluded. - Tricuspid valve: There was trivial regurgitation. - Pulmonary arteries: Systolic pressure could not be accurately estimated. - Pericardium, extracardiac: There was no pericardial effusion.  Impressions:  - Upper normal LV chamber size with moderate LVH and LVEF approximately 40% as discussed above. There is akinesis of the basal inferoseptal and inferior myocardium. Grade 1 diastolic dysfunction. Compared to the previous study from October 2015, LVEF is in similar range, perhaps slightly reduced. Upper normal left atrial chamber size. Trivial tricuspid regurgitation. Cannot exclude PFO based on limited images.   01/2018 echo Study Conclusions  - Left ventricle: The cavity size was  normal. Wall thickness was increased in a pattern of mild LVH. The estimated ejection fraction was 35%. There is akinesis of the basal-midinferior myocardium. There is akinesis of the mid-apicalanteroseptal and apical myocardium. Doppler parameters are consistent with abnormal left ventricular relaxation (grade 1 diastolic dysfunction). - Ventricular septum: Septal motion showed abnormal function and dyssynergy suggestive of left bundle [name] block. - Aortic valve: Moderately calcified annulus. Trileaflet. - Mitral valve: There was mild regurgitation. - Right atrium: Central venous pressure (est): 3 mm Hg. - Atrial septum: No defect or patent foramen ovale was identified. - Tricuspid valve: There was trivial regurgitation. - Pulmonary arteries: Systolic pressure could not be accurately  estimated. - Pericardium, extracardiac: There was no pericardial effusion   11/2018 echo 1. The left ventricle has moderate-severely reduced systolic function, with an ejection fraction of 30-35%. The cavity size was normal. There is mildly increased left ventricular wall thickness. Left ventricular diastolic Doppler parameters are  consistent with impaired relaxation. Elevated mean left atrial pressure. 2. The anteroseptal, anterolateral, apical walls are hypokinetic. 3. The right ventricle has normal systolic function. The cavity was normal. There is no increase in right ventricular wall thickness. 4. Left atrial size was moderately dilated. 5. No evidence of mitral valve stenosis. 6. The aortic valve is tricuspid no stenosis of the aortic valve. 7. Pulmonary hypertension is indeterminant, inadequate TR jet. 8. The inferior vena cava was dilated in size with <50% respiratory variability. 9. The interatrial septum was not well visualized.   Labs/Other Tests and Data Reviewed:    EKG:  No ECG reviewed.  Recent Labs: 04/30/2019: BUN 19; Creatinine, Ser 0.96; Hemoglobin 11.2;  Platelets 170; Potassium 4.1; Sodium 137   Recent Lipid Panel No results found for: CHOL, TRIG, HDL, CHOLHDL, LDLCALC, LDLDIRECT  Wt Readings from Last 3 Encounters:  05/24/19 246 lb (111.6 kg)  05/04/19 247 lb 4.8 oz (112.2 kg)  03/02/19 243 lb (110.2 kg)     Objective:    Vital Signs:  BP 137/83    Pulse 73    Ht 5\' 5"  (1.651 m)    Wt 246 lb (111.6 kg)    BMI 40.94 kg/m    Normal affect. Normal speech pattern and tone. Comfortable, no apparent distress. No   ASSESSMENT & PLAN:    1. Chronic combined systolic/diastolic HF - medical therapy limited by soft bp's. Did not tolerate entresto. She is on her maximally tolerated regimen - s/p BIV AICD last month, likekly repeat echo over next few months - continue current meds  2. CAD - medically managed single vessel RCA disease - no symptoms, continue curren tmeds  3.OSA - very tough time since 2017 sleep study being able to coordinate follow and getting her machine  - has new pt appt next month with Dr Radford Pax  4. Hyperlipidemia - she will continue statin   COVID-19 Education: The signs and symptoms of COVID-19 were discussed with the patient and how to seek care for testing (follow up with PCP or arrange E-visit).  The importance of social distancing was discussed today.  Time:   Today, I have spent 15 minutes with the patient with telehealth technology discussing the above problems.     Medication Adjustments/Labs and Tests Ordered: Current medicines are reviewed at length with the patient today.  Concerns regarding medicines are outlined above.   Tests Ordered: No orders of the defined types were placed in this encounter.   Medication Changes: No orders of the defined types were placed in this encounter.   Follow Up:  Virtual Visit in 4 month(s)  Signed, [name], MD  05/24/2019 11:57 AM    [place]

## 2019-05-24 NOTE — Patient Instructions (Signed)
Your physician recommends that you schedule a follow-up appointment in: 4 MONTHS WITH DR BRANCH  Your physician recommends that you continue on your current medications as directed. Please refer to the Current Medication list given to you today.  Thank you for choosing [facility]!!    

## 2019-06-11 ENCOUNTER — Other Ambulatory Visit: Payer: Self-pay | Admitting: Cardiology

## 2019-06-11 ENCOUNTER — Telehealth: Payer: Medicare HMO | Admitting: Cardiology

## 2019-06-27 ENCOUNTER — Telehealth (INDEPENDENT_AMBULATORY_CARE_PROVIDER_SITE_OTHER): Payer: Medicare HMO | Admitting: Cardiology

## 2019-06-27 ENCOUNTER — Encounter: Payer: Self-pay | Admitting: Cardiology

## 2019-06-27 ENCOUNTER — Other Ambulatory Visit: Payer: Self-pay | Admitting: Cardiology

## 2019-06-27 ENCOUNTER — Other Ambulatory Visit: Payer: Self-pay

## 2019-06-27 VITALS — BP 118/68 | HR 85 | Ht 65.0 in | Wt 244.0 lb

## 2019-06-27 DIAGNOSIS — G4733 Obstructive sleep apnea (adult) (pediatric): Secondary | ICD-10-CM | POA: Diagnosis not present

## 2019-06-27 DIAGNOSIS — I10 Essential (primary) hypertension: Secondary | ICD-10-CM | POA: Diagnosis not present

## 2019-06-27 NOTE — Progress Notes (Signed)
Virtual Visit via Telephone Note   This visit type was conducted due to national recommendations for restrictions regarding the COVID-19 Pandemic (e.g. social distancing) in an effort to limit this patient's exposure and mitigate transmission in our community.  Due to her co-morbid illnesses, this patient is at least at moderate risk for complications without adequate follow up.  This format is felt to be most appropriate for this patient at this time.  The patient did not have access to video technology/had technical difficulties with video requiring transitioning to audio format only (telephone).  All issues noted in this document were discussed and addressed.  No physical exam could be performed with this format.  Please refer to the patient's chart for her  consent to telehealth for [facility].   Evaluation Performed:  Follow-up visit  This visit type was conducted due to national recommendations for restrictions regarding the COVID-19 Pandemic (e.g. social distancing).  This format is felt to be most appropriate for this patient at this time.  All issues noted in this document were discussed and addressed.  No physical exam was performed (except for noted visual exam findings with Video Visits).  Please refer to the patient's chart (MyChart message for video visits and phone note for telephone visits) for the patient's consent to telehealth for [facility].  Date:  06/27/2019   ID:  [name], DOB [birth year], MRN [id number]  Patient Location:  Home  Provider location:   [place]  PCP:  [name], MD  Cardiologist:  [name], MD  Sleep Medicine:  Fransico Him, MD Electrophysiologist:  None   Chief Complaint:  OSA  History of Present Illness:    [name] is a 68 y.o. female who presents via audio/video conferencing for a telehealth visit today.    This is a 68yo female with a hx of severe OSA by sleep study in 2017 and apparently never got her CPAP  device.  This was ordered through Dr. Luan Pulling but she never was able to get it ordered. She tells me that the pharmacy in [place] who provides CPAP supplies and devices would not take her insurance and so she never got the device.  She tells that she feels sleepy when she wakes up in the am and sometimes takes a nap during the day when she is watching TV.  She denies any HAs in the am.  She denies any awakenings gasping for breath but has been told by her children that she snores.   The patient does not have symptoms concerning for COVID-19 infection (fever, chills, cough, or new shortness of breath).    Prior CV studies:   The following studies were reviewed today:  Sleep study  Past Medical History:  Diagnosis Date  . Anemia   . Arthritis   . CAD (coronary artery disease)    a. LHC (03/2014) Lmain: nl, LAD: diff dz proximal 20%, 30-40% dz mid vessel prior 2nd diagonal, LCx: 40% in OM1, 20-30% in OM2, RCA: 99% subtotal occlusion @ crux, TIMI 1 flow, L-R collaterals to distal vessel  . Chronic systolic heart failure ([place])    a. ECHO (03/2014): EF 25-30%, akinesis enteroanteroseptal myocardium, grade III DD b. RHC (03/2014) RA 4, RV 42/4, PA 44/14 (26), PCWP 21, PA 62% Fick CO/CI 6.9 / 3.4  . Diabetes mellitus   . Duodenal ulcer    Age 64  . Erosive esophagitis   . Gastritis   . Hypertension   . Ischemic cardiomyopathy   .  Obese   . Short-term memory loss   . Thyroid disease   . TTP (thrombotic thrombocytopenic purpura) (HCC)    Past Surgical History:  Procedure Laterality Date  . BIV ICD INSERTION CRT-D N/A 05/03/2019   Procedure: BIV ICD INSERTION CRT-D;  Surgeon: Thompson Grayer, MD;  Location: [place] CV LAB;  Service: Cardiovascular;  Laterality: N/A;  . COLONOSCOPY  08/29/2012   [id number] diverticulosis  . COLONOSCOPY N/A 05/28/2015   Procedure: COLONOSCOPY;  Surgeon: Daneil Dolin, MD;  [facility];  Service: Endoscopy;  Laterality: N/A;  1015  . ECTOPIC PREGNANCY  SURGERY    . ESOPHAGOGASTRODUODENOSCOPY  04/20/2012   [id number] antral gastritis/Erosive reflux esophagitis  . LEFT AND RIGHT HEART CATHETERIZATION WITH CORONARY ANGIOGRAM N/A 03/28/2014   Procedure: LEFT AND RIGHT HEART CATHETERIZATION WITH CORONARY ANGIOGRAM;  Surgeon: Peter M Martinique, MD;  Location: [facility] CATH LAB;  Service: Cardiovascular;  Laterality: N/A;  . SPLENECTOMY, PARTIAL       Current Meds  Medication Sig  . acetaminophen (TYLENOL) 500 MG tablet Take 1,000 mg by mouth every 6 (six) hours as needed for moderate pain.   Marland Kitchen aspirin 81 MG chewable tablet Chew 1 tablet (81 mg total) by mouth daily.  Marland Kitchen atorvastatin (LIPITOR) 80 MG tablet Take 1 tablet (80 mg total) by mouth daily.  . carvedilol (COREG) 25 MG tablet Take 25 mg by mouth 2 (two) times daily with a meal.  . furosemide (LASIX) 20 MG tablet Take 1 tablet (20 mg total) by mouth daily.  . insulin glargine (LANTUS) 100 UNIT/ML injection Inject 40 Units into the skin 2 (two) times daily.  Marland Kitchen levothyroxine (SYNTHROID, LEVOTHROID) 25 MCG tablet Take 25 mcg by mouth daily before breakfast.  . losartan (COZAAR) 25 MG tablet Take 1 tablet (25 mg total) by mouth daily.  . Multiple Vitamin (MULTIVITAMIN) tablet Take 1 tablet by mouth daily.  Marland Kitchen NOVOLOG 100 UNIT/ML injection Inject 10 Units into the skin 3 (three) times daily with meals.   . polyethylene glycol (MIRALAX / GLYCOLAX) packet Take 17 g by mouth daily as needed for mild constipation.   Marland Kitchen spironolactone (ALDACTONE) 25 MG tablet Take 1 tablet (25 mg total) by mouth daily.     Allergies:   Other and Lisinopril   Social History   Tobacco Use  . Smoking status: Former Smoker    Packs/day: 0.25    Years: 10.00    Pack years: 2.50    Types: Cigarettes    Start date: 09/20/1966    Quit date: 09/20/1978    Years since quitting: 40.7  . Smokeless tobacco: Never Used  . Tobacco comment: Quit 2008  Substance Use Topics  . Alcohol use: No    Alcohol/week: 0.0 standard drinks  .  Drug use: No     Family Hx: The patient's family history includes Cirrhosis in her father; Diabetes in her daughter; Heart failure in her mother. There is no history of Colon cancer.  ROS:   Please see the history of present illness.     All other systems reviewed and are negative.   Labs/Other Tests and Data Reviewed:    Recent Labs: 04/30/2019: BUN 19; Creatinine, Ser 0.96; Hemoglobin 11.2; Platelets 170; Potassium 4.1; Sodium 137   Recent Lipid Panel No results found for: CHOL, TRIG, HDL, CHOLHDL, LDLCALC, LDLDIRECT  Wt Readings from Last 3 Encounters:  06/27/19 244 lb (110.7 kg)  05/24/19 246 lb (111.6 kg)  05/04/19 247 lb 4.8 oz (112.2 kg)  Objective:    Vital Signs:  BP 118/68   Pulse 85   Ht 5\' 5"  (1.651 m)   Wt 244 lb (110.7 kg)   BMI 40.60 kg/m     ASSESSMENT & PLAN:    1.  OSA -she had a sleep study back in 2017 but never got a CPAP unit due to issues with insurance coverage and the DME that was close to her home so she never got on it.   -she will need another sleep study to qualify for CPAP since it has been 3 years since her last sleep study -order split night sleep study  2.  HTN -BP controlled -continue Carvedilol 25mg  BID, Losartan 25mg  daily and spiro 25mg  daily  3.  Morbid Obesity -I have encouraged her to get into a routine walking program and cut back on carbs and portions.  -her exercise is limited by problems walking  COVID-19 Education: The signs and symptoms of COVID-19 were discussed with the patient and how to seek care for testing (follow up with PCP or arrange E-visit).  The importance of social distancing was discussed today.  Patient Risk:   After full review of this patient's clinical status, I feel that they are at least moderate risk at this time.  Time:   Today, I have spent 20 minutes directly with the patient on telemedecine discussing medical problems including OSA.  We also reviewed the symptoms of COVID 19 and the ways  to protect against contracting the virus with telehealth technology.  I spent an additional 5 minutes reviewing patient's chart including sleep study.  Medication Adjustments/Labs and Tests Ordered: Current medicines are reviewed at length with the patient today.  Concerns regarding medicines are outlined above.  Tests Ordered: No orders of the defined types were placed in this encounter.  Medication Changes: No orders of the defined types were placed in this encounter.   Disposition:  Follow up prn after sleep study  Signed, Fransico Him, MD  06/27/2019 9:54 AM    [facility]

## 2019-06-27 NOTE — Patient Instructions (Addendum)
Medication Instructions:   Your physician recommends that you continue on your current medications as directed. Please refer to the Current Medication list given to you today.  If you need a refill on your cardiac medications before your next appointment, please call your pharmacy.     Testing/Procedures:  Your physician has recommended that you have a sleep study. This test records several body functions during sleep, including: brain activity, eye movement, oxygen and carbon dioxide blood levels, heart rate and rhythm, breathing rate and rhythm, the flow of air through your mouth and nose, snoring, body muscle movements, and chest and belly movement.  DR. Landis Gandy SLEEP STUDY COORDINATOR WILL BE CALLING YOU SOON TO HAVE THIS TEST ARRANGED. HER NAME IS NINA JONES.     Follow-Up:  WITH DR. Radford Pax AS NEEDED, BASED ON YOUR SLEEP STUDY RESULTS    Any Other Special Instructions Will Be Listed Below (If Applicable).  DR. Theodosia Blender SLEEP STUDY COORDINATOR WILL BE CALLING YOU SOON TO HAVE YOUR SLEEP STUDY ARRANGED. HER NAME IS NINA JONES AND HER DIRECT NUMBER IS [phone].

## 2019-06-28 ENCOUNTER — Telehealth: Payer: Self-pay | Admitting: *Deleted

## 2019-06-28 NOTE — Telephone Encounter (Signed)
-----   Message from Sueanne Margarita, MD sent at 06/27/2019  9:59 AM EDT ----- Order split night sleep study for OSA.  Followup after sleep study results

## 2019-06-28 NOTE — Telephone Encounter (Signed)
Split night order placed to sleep pool.

## 2019-06-29 ENCOUNTER — Telehealth: Payer: Self-pay | Admitting: *Deleted

## 2019-06-29 NOTE — Telephone Encounter (Signed)
PA submitted to [employer] for in lab sleep study. °

## 2019-07-05 ENCOUNTER — Telehealth: Payer: Self-pay | Admitting: *Deleted

## 2019-07-05 NOTE — Telephone Encounter (Signed)
Staff message sent to [facility] received. Ok to schedule sleep study. Josem Kaufmann [id number]. Valid dates 07/03/19 to 08/02/19.

## 2019-07-09 ENCOUNTER — Telehealth: Payer: Self-pay | Admitting: *Deleted

## 2019-07-09 ENCOUNTER — Other Ambulatory Visit (HOSPITAL_BASED_OUTPATIENT_CLINIC_OR_DEPARTMENT_OTHER): Payer: Self-pay

## 2019-07-09 DIAGNOSIS — G4733 Obstructive sleep apnea (adult) (pediatric): Secondary | ICD-10-CM

## 2019-07-09 NOTE — Telephone Encounter (Signed)
-----   Message from Lauralee Evener, [place] sent at 07/05/2019 12:04 PM EDT ----- Regarding: RE: [place] received. Ok to schedule sleep study. Auth # [id number]. 07/03/19 to 08/02/19 ----- Message ----- From: Freada Bergeron, CMA Sent: 06/28/2019   4:36 PM EDT To: Cv Div Sleep Studies Subject: precert                                        Order split night sleep study for OSA

## 2019-07-10 NOTE — Telephone Encounter (Signed)
Patient is scheduled for lab study on 07/17/19. Pt is scheduled for COVID screening on 07/13/19 1:15 prior to titration.   Patient understands her [facility] at [facility]. Patient understands she will receive a sleep packet in a week or so. Patient understands to call if she does not receive the sleep packet in a timely manner. Patient agrees with treatment and thanked me for call.

## 2019-07-12 DIAGNOSIS — I2510 Atherosclerotic heart disease of native coronary artery without angina pectoris: Secondary | ICD-10-CM | POA: Diagnosis not present

## 2019-07-12 DIAGNOSIS — J9611 Chronic respiratory failure with hypoxia: Secondary | ICD-10-CM | POA: Diagnosis not present

## 2019-07-12 DIAGNOSIS — J449 Chronic obstructive pulmonary disease, unspecified: Secondary | ICD-10-CM | POA: Diagnosis not present

## 2019-07-12 DIAGNOSIS — I10 Essential (primary) hypertension: Secondary | ICD-10-CM | POA: Diagnosis not present

## 2019-07-12 DIAGNOSIS — E119 Type 2 diabetes mellitus without complications: Secondary | ICD-10-CM | POA: Diagnosis not present

## 2019-07-12 DIAGNOSIS — I5042 Chronic combined systolic (congestive) and diastolic (congestive) heart failure: Secondary | ICD-10-CM | POA: Diagnosis not present

## 2019-07-12 DIAGNOSIS — Z794 Long term (current) use of insulin: Secondary | ICD-10-CM | POA: Diagnosis not present

## 2019-07-12 DIAGNOSIS — I509 Heart failure, unspecified: Secondary | ICD-10-CM | POA: Diagnosis not present

## 2019-07-12 DIAGNOSIS — G4733 Obstructive sleep apnea (adult) (pediatric): Secondary | ICD-10-CM | POA: Diagnosis not present

## 2019-07-12 DIAGNOSIS — Z23 Encounter for immunization: Secondary | ICD-10-CM | POA: Diagnosis not present

## 2019-07-13 ENCOUNTER — Other Ambulatory Visit: Payer: Self-pay

## 2019-07-13 ENCOUNTER — Other Ambulatory Visit (HOSPITAL_COMMUNITY)
Admission: RE | Admit: 2019-07-13 | Discharge: 2019-07-13 | Disposition: A | Discharge status: Home / Self Care | Payer: Medicare HMO | Source: Ambulatory Visit | Attending: Cardiology | Admitting: Cardiology

## 2019-07-13 DIAGNOSIS — Z01812 Encounter for preprocedural laboratory examination: Secondary | ICD-10-CM | POA: Insufficient documentation

## 2019-07-13 DIAGNOSIS — Z20828 Contact with and (suspected) exposure to other viral communicable diseases: Secondary | ICD-10-CM | POA: Diagnosis not present

## 2019-07-13 DIAGNOSIS — Z20822 Contact with and (suspected) exposure to covid-19: Secondary | ICD-10-CM

## 2019-07-13 LAB — SARS CORONAVIRUS 2 (TAT 6-24 HRS): SARS Coronavirus 2: NEGATIVE

## 2019-07-13 NOTE — Addendum Note (Signed)
Addended by: Marvene Staff on: 07/13/2019 02:05 PM   Modules accepted: Orders

## 2019-07-16 ENCOUNTER — Telehealth: Payer: Self-pay

## 2019-07-16 NOTE — Telephone Encounter (Signed)
-----   Message from Sueanne Margarita, MD sent at 07/14/2019  3:03 PM EDT ----- Please let patient know that labs were normal.  Continue current medical therapy.

## 2019-07-16 NOTE — Telephone Encounter (Signed)
Notes recorded by Frederik Schmidt, RN on 07/16/2019 at 8:08 AM EDT  The patient has been notified of the result and verbalized understanding. All questions (if any) were answered.  Frederik Schmidt, RN 07/16/2019 8:08 AM

## 2019-07-17 ENCOUNTER — Other Ambulatory Visit: Payer: Self-pay

## 2019-07-17 ENCOUNTER — Ambulatory Visit: Payer: Medicare HMO | Attending: Cardiology | Admitting: Cardiology

## 2019-07-17 DIAGNOSIS — G4733 Obstructive sleep apnea (adult) (pediatric): Secondary | ICD-10-CM | POA: Insufficient documentation

## 2019-07-20 NOTE — Procedures (Signed)
Patient Name: [name], [name] Date: 07/17/2019 Gender: Female D.O.B: 25-Feb-1951 Age (years): 23 Referring Provider: Fransico Him MD, ABSM Height (inches): 65 Interpreting Physician: Fransico Him MD, ABSM Weight (lbs): 238 RPSGT: Rosebud Poles BMI: 40 MRN: [id number] Neck Size: 18.00  CLINICAL INFORMATION Sleep Study Type: Split Night CPAP  Indication for sleep study: OSA  Epworth Sleepiness Score: 16  SLEEP STUDY TECHNIQUE As per the AASM Manual for the Scoring of Sleep and Associated Events v2.3 (April 2016) with a hypopnea requiring 4% desaturations.  The channels recorded and monitored were frontal, central and occipital EEG, electrooculogram (EOG), submentalis EMG (chin), nasal and oral airflow, thoracic and abdominal wall motion, anterior tibialis EMG, snore microphone, electrocardiogram, and pulse oximetry. Continuous positive airway pressure (CPAP) was initiated when the patient met split night criteria and was titrated according to treat sleep-disordered breathing.  MEDICATIONS Medications self-administered by patient taken the night of the study : N/A  RESPIRATORY PARAMETERS Diagnostic Total AHI (/hr): 77.7  RDI (/hr):77.7  OA Index (/hr): 0.5  CA Index (/hr): 0.5 REM AHI (/hr): N/A  NREM AHI (/hr):77.7  Supine AHI (/hr):77.7  Non-supine AHI (/hr):0 Min O2 Sat (%):89.0  Mean O2 (%): 95.4  Time below 88% (min):0   Titration Optimal Pressure (cm):N/A  AHI at Optimal Pressure (/hr):N/A  Min O2 at Optimal Pressure (%):81.0 Supine % at Optimal (%):N/A  Sleep % at Optimal (%):N/A   SLEEP ARCHITECTURE The recording time for the entire night was 369.6 minutes.  During a baseline period of 151.1 minutes, the patient slept for 130.5 minutes in REM and nonREM, yielding a sleep efficiency of 86.4%%. Sleep onset after lights out was 0.3 minutes with a REM latency of N/A minutes. The patient spent 8.4% of the night in stage N1 sleep, 91.6% in stage N2 sleep,  0.0% in stage N3 and 0% in REM.  During the titration period of 206.6 minutes, the patient slept for 118.0 minutes in REM and nonREM, yielding a sleep efficiency of 57.1%. Sleep onset after CPAP initiation was 16.3 minutes with a REM latency of 76.5 minutes. The patient spent 12.7% of the night in stage N1 sleep, 35.2% in stage N2 sleep, 14.8% in stage N3 and 37.3% in REM.  CARDIAC DATA The 2 lead EKG demonstrated sinus rhythm. The mean heart rate was 100.0 beats per minute. Other EKG findings include: PVCs.  LEG MOVEMENT DATA The total Periodic Limb Movements of Sleep (PLMS) were 0. The PLMS index was 0.0 .  IMPRESSIONS - Severe obstructive sleep apnea occurred during the diagnostic portion of the study (AHI = 77.7/hour). An optimal PAP pressure could not be selected for this patient based on the available study data. - Mild central sleep apnea occurred during the diagnostic portion of the study (CAI = 0.5/hour). - Moderate oxygen desaturation was noted during the diagnostic portion of the study (Min O2 =89.0%). - The patient snored with moderate snoring volume during the diagnostic portion of the study. - EKG findings include PVCs. - Clinically significant periodic limb movements did not occur during sleep.  DIAGNOSIS - Obstructive Sleep Apnea (327.23 [G47.33 ICD-10])  RECOMMENDATIONS - Recommend BiPAP titration given sub-optimal CPAP titration. - Avoid alcohol, sedatives and other CNS depressants that may worsen sleep apnea and disrupt normal sleep architecture. - Sleep hygiene should be reviewed to assess factors that may improve sleep quality. - Weight management and regular exercise should be initiated or continued.  [Electronically signed] 07/20/2019 12:00 PM  Fransico Him MD, ABSM Diplomate, American  Board of Sleep Medicine

## 2019-07-23 ENCOUNTER — Telehealth: Payer: Self-pay | Admitting: *Deleted

## 2019-07-23 DIAGNOSIS — G4733 Obstructive sleep apnea (adult) (pediatric): Secondary | ICD-10-CM

## 2019-07-23 NOTE — Telephone Encounter (Signed)
-----   Message from Christina Margarita, MD sent at 07/20/2019 12:04 PM EDT ----- Please let patient know that they have significant sleep apnea but had unsuccessful PAP titration due to severity of OSA.  Please set up in lab BiPAP titration

## 2019-07-23 NOTE — Telephone Encounter (Signed)
Informed patient of sleep study results and patient understanding was verbalized. Patient understands her sleep study showed they have significant sleep apnea but had unsuccessful PAP titration due to severity of OSA. Please set up in lab BiPAP titration. Pt is aware and agreeable to her results.  Bipap titration sent to sleep pool

## 2019-07-26 ENCOUNTER — Telehealth: Payer: Self-pay | Admitting: *Deleted

## 2019-07-26 NOTE — Telephone Encounter (Signed)
PA for BIPAP titration sent to [facility] via web portal.

## 2019-07-27 ENCOUNTER — Telehealth: Payer: Self-pay | Admitting: *Deleted

## 2019-07-27 NOTE — Telephone Encounter (Signed)
Staff message sent to [facility] received. Ok to schedule BIPAP titration. Auth # [id number]. Valid DOS 08/06/19 to 09/05/19.

## 2019-08-03 ENCOUNTER — Encounter: Payer: Self-pay | Admitting: Internal Medicine

## 2019-08-03 ENCOUNTER — Telehealth: Payer: Self-pay | Admitting: *Deleted

## 2019-08-03 ENCOUNTER — Telehealth (INDEPENDENT_AMBULATORY_CARE_PROVIDER_SITE_OTHER): Payer: Medicare HMO | Admitting: Internal Medicine

## 2019-08-03 ENCOUNTER — Ambulatory Visit (INDEPENDENT_AMBULATORY_CARE_PROVIDER_SITE_OTHER): Payer: Medicare HMO | Admitting: *Deleted

## 2019-08-03 ENCOUNTER — Other Ambulatory Visit: Payer: Self-pay

## 2019-08-03 ENCOUNTER — Telehealth: Payer: Self-pay

## 2019-08-03 VITALS — Ht 65.0 in | Wt 244.0 lb

## 2019-08-03 DIAGNOSIS — G4733 Obstructive sleep apnea (adult) (pediatric): Secondary | ICD-10-CM

## 2019-08-03 DIAGNOSIS — I5042 Chronic combined systolic (congestive) and diastolic (congestive) heart failure: Secondary | ICD-10-CM

## 2019-08-03 DIAGNOSIS — I420 Dilated cardiomyopathy: Secondary | ICD-10-CM

## 2019-08-03 LAB — CUP PACEART REMOTE DEVICE CHECK
Battery Remaining Longevity: 59 mo
Battery Remaining Percentage: 93 %
Battery Voltage: 3.16 V
Brady Statistic AP VP Percent: 1 %
Brady Statistic AP VS Percent: 1 %
Brady Statistic AS VP Percent: 99 %
Brady Statistic AS VS Percent: 1 %
Brady Statistic RA Percent Paced: 1 %
Date Time Interrogation Session: 20201113084232
HighPow Impedance: 56 Ohm
HighPow Impedance: 56 Ohm
Implantable Lead Implant Date: 20200813
Implantable Lead Implant Date: 20200813
Implantable Lead Implant Date: 20200813
Implantable Lead Location: 753858
Implantable Lead Location: 753859
Implantable Lead Location: 753860
Implantable Pulse Generator Implant Date: 20200813
Lead Channel Impedance Value: 390 Ohm
Lead Channel Impedance Value: 450 Ohm
Lead Channel Impedance Value: 480 Ohm
Lead Channel Pacing Threshold Amplitude: 0.5 V
Lead Channel Pacing Threshold Amplitude: 0.625 V
Lead Channel Pacing Threshold Amplitude: 1.75 V
Lead Channel Pacing Threshold Pulse Width: 0.4 ms
Lead Channel Pacing Threshold Pulse Width: 0.5 ms
Lead Channel Pacing Threshold Pulse Width: 0.6 ms
Lead Channel Sensing Intrinsic Amplitude: 11.9 mV
Lead Channel Sensing Intrinsic Amplitude: 4.8 mV
Lead Channel Setting Pacing Amplitude: 2 V
Lead Channel Setting Pacing Amplitude: 2.75 V
Lead Channel Setting Pacing Amplitude: 3.5 V
Lead Channel Setting Pacing Pulse Width: 0.5 ms
Lead Channel Setting Pacing Pulse Width: 0.6 ms
Lead Channel Setting Sensing Sensitivity: 0.5 mV
Pulse Gen Serial Number: 9891600

## 2019-08-03 NOTE — Progress Notes (Signed)
Electrophysiology TeleHealth Note  Due to national recommendations of social distancing due to [place] 19, an audio telehealth visit is felt to be most appropriate for this patient at this time.  Verbal consent was obtained by me for the telehealth visit today.  The patient does not have capability for a virtual visit.  A phone visit is therefore required today.   Date:  08/03/2019   ID:  [name], DOB 24-Dec-1950, MRN [id number]  Location: patient's home  Provider location:  [facility]  Evaluation Performed: Follow-up visit  PCP:  [name], MD   Electrophysiologist:  Dr Rayann Heman  Chief Complaint:  palpitations  History of Present Illness:    [name] is a 68 y.o. female who presents via telehealth conferencing today.  Since her BiV ICD implant, the patient reports doing very well. She feels "a little better" with the device.  Today, she denies symptoms of palpitations, chest pain, shortness of breath,  lower extremity edema, dizziness, presyncope, or syncope.  The patient is otherwise without complaint today.  The patient denies symptoms of fevers, chills, cough, or new SOB worrisome for COVID 19.  Past Medical History:  Diagnosis Date  . Anemia   . Arthritis   . CAD (coronary artery disease)    a. LHC (03/2014) Lmain: nl, LAD: diff dz proximal 20%, 30-40% dz mid vessel prior 2nd diagonal, LCx: 40% in OM1, 20-30% in OM2, RCA: 99% subtotal occlusion @ crux, TIMI 1 flow, L-R collaterals to distal vessel  . Chronic systolic heart failure ([place])    a. ECHO (03/2014): EF 25-30%, akinesis enteroanteroseptal myocardium, grade III DD b. RHC (03/2014) RA 4, RV 42/4, PA 44/14 (26), PCWP 21, PA 62% Fick CO/CI 6.9 / 3.4  . Diabetes mellitus   . Duodenal ulcer    Age 52  . Erosive esophagitis   . Gastritis   . Hypertension   . Ischemic cardiomyopathy   . Obese   . Short-term memory loss   . Thyroid disease   . TTP (thrombotic thrombocytopenic purpura) (HCC)     Past  Surgical History:  Procedure Laterality Date  . BIV ICD INSERTION CRT-D N/A 05/03/2019   Procedure: BIV ICD INSERTION CRT-D;  Surgeon: Thompson Grayer, MD;  Location: [place] CV LAB;  Service: Cardiovascular;  Laterality: N/A;  . COLONOSCOPY  08/29/2012   [id number] diverticulosis  . COLONOSCOPY N/A 05/28/2015   Procedure: COLONOSCOPY;  Surgeon: Daneil Dolin, MD;  [facility];  Service: Endoscopy;  Laterality: N/A;  1015  . ECTOPIC PREGNANCY SURGERY    . ESOPHAGOGASTRODUODENOSCOPY  04/20/2012   [id number] antral gastritis/Erosive reflux esophagitis  . LEFT AND RIGHT HEART CATHETERIZATION WITH CORONARY ANGIOGRAM N/A 03/28/2014   Procedure: LEFT AND RIGHT HEART CATHETERIZATION WITH CORONARY ANGIOGRAM;  Surgeon: Peter M Martinique, MD;  Location: [facility] CATH LAB;  Service: Cardiovascular;  Laterality: N/A;  . SPLENECTOMY, PARTIAL      Current Outpatient Medications  Medication Sig Dispense Refill  . acetaminophen (TYLENOL) 500 MG tablet Take 1,000 mg by mouth every 6 (six) hours as needed for moderate pain.     Marland Kitchen aspirin 81 MG chewable tablet Chew 1 tablet (81 mg total) by mouth daily.    Marland Kitchen atorvastatin (LIPITOR) 80 MG tablet Take 1 tablet (80 mg total) by mouth daily. 90 tablet 1  . carvedilol (COREG) 25 MG tablet Take 25 mg by mouth 2 (two) times daily with a meal.    . furosemide (LASIX) 20 MG  tablet Take 1 tablet (20 mg total) by mouth daily. 90 tablet 2  . insulin glargine (LANTUS) 100 UNIT/ML injection Inject 40 Units into the skin 2 (two) times daily.    Marland Kitchen levothyroxine (SYNTHROID, LEVOTHROID) 25 MCG tablet Take 25 mcg by mouth daily before breakfast.    . losartan (COZAAR) 25 MG tablet TAKE 1 TABLET (25 MG TOTAL) BY MOUTH DAILY. 90 tablet 1  . Multiple Vitamin (MULTIVITAMIN) tablet Take 1 tablet by mouth daily.    Marland Kitchen NOVOLOG 100 UNIT/ML injection Inject 10 Units into the skin 3 (three) times daily with meals.     . polyethylene glycol (MIRALAX / GLYCOLAX) packet Take 17 g by mouth  daily as needed for mild constipation.     Marland Kitchen spironolactone (ALDACTONE) 25 MG tablet Take 1 tablet (25 mg total) by mouth daily. 90 tablet 2   No current facility-administered medications for this visit.     Allergies:   Other and Lisinopril   Social History:  The patient  reports that she quit smoking about 40 years ago. Her smoking use included cigarettes. She started smoking about 52 years ago. She has a 2.50 pack-year smoking history. She has never used smokeless tobacco. She reports that she does not drink alcohol or use drugs.   Family History:  The patient's family history includes Cirrhosis in her father; Diabetes in her daughter; Heart failure in her mother.   ROS:  Please see the history of present illness.   All other systems are personally reviewed and negative.    Exam:    Vital Signs:  Ht 5\' 5"  (1.651 m)   Wt 244 lb (110.7 kg)   BMI 40.60 kg/m   Well sounding, alert and conversant   Labs/Other Tests and Data Reviewed:    Recent Labs: 04/30/2019: BUN 19; Creatinine, Ser 0.96; Hemoglobin 11.2; Platelets 170; Potassium 4.1; Sodium 137   Wt Readings from Last 3 Encounters:  08/03/19 244 lb (110.7 kg)  06/27/19 244 lb (110.7 kg)  05/24/19 246 lb (111.6 kg)     Last device remote is reviewed from [facility] PDF which reveals normal device function, no arrhythmias    ASSESSMENT & PLAN:    1.  Mixed ischemic/ nonischemic CM/ LBBB Doing well s/p BIV ICD implant Continue medical therapy Enroll in [facility] device clinic with Sharman Cheek Return in 3 months for visit with EP PA with echo at that time to determine if additional optimization is required  2. CAD No ischemic symptoms  3. OSA Followed by Dr Luan Pulling     Patient Risk:  after full review of this patients clinical status, I feel that they are at moderate risk at this time.  Today, I have spent 15 minutes with the patient with telehealth technology discussing arrhythmia management .    SignedThompson Grayer,  MD  08/03/2019 2:21 PM     [address] [place] [address] [phone] (office) [phone] (fax)

## 2019-08-03 NOTE — Telephone Encounter (Addendum)
Patient is scheduled for BiPAP Titration on 08/14/19. [name] is scheduled for COVID screening on 08/10/19 12 pm prior to titration. Patient understands her titration study will be done at [facility] sleep [facility]. Patient understands she will receive a letter in a week or so detailing appointment, date, time, and location. Patient understands to call if she does not receive the letter  in a timely manner. Patient agrees with treatment and thanked me for call.

## 2019-08-03 NOTE — Telephone Encounter (Signed)
-----   Message from Thompson Grayer, MD sent at 08/03/2019  2:23 PM EST ----- Echo in 3 months, prior to office visit with Oda Kilts. Then 3 month visit with Jonni Sanger.

## 2019-08-03 NOTE — Telephone Encounter (Signed)
-----   Message from Lauralee Evener, CMA sent at 07/27/2019  1:32 PM EST ----- Regarding: RE: precert [employer] auth received. Ok to schedule BIPAP titration. Auth # [id number]. Dates 11/16 to 12/16, ----- Message ----- From: Freada Bergeron, CMA Sent: 07/23/2019   3:48 PM EST To: Cv Div Sleep Studies Subject: precert                                        Bipap titration

## 2019-08-03 NOTE — Telephone Encounter (Signed)
Order placed for ECHO.  Please schedule for ECHO in 3 months followed by appt with A. Tillery.

## 2019-08-06 ENCOUNTER — Other Ambulatory Visit (HOSPITAL_BASED_OUTPATIENT_CLINIC_OR_DEPARTMENT_OTHER): Payer: Self-pay

## 2019-08-10 ENCOUNTER — Other Ambulatory Visit (HOSPITAL_COMMUNITY): Payer: Medicare HMO

## 2019-08-11 ENCOUNTER — Other Ambulatory Visit (HOSPITAL_COMMUNITY): Payer: Medicare HMO

## 2019-08-14 ENCOUNTER — Telehealth: Payer: Self-pay

## 2019-08-14 ENCOUNTER — Encounter (HOSPITAL_BASED_OUTPATIENT_CLINIC_OR_DEPARTMENT_OTHER): Payer: Medicare HMO | Admitting: Cardiology

## 2019-08-14 NOTE — Telephone Encounter (Signed)
Referred to [facility] clinic by Dr Rayann Heman following 08/03/2019 telehealth visit.Marland Kitchen  Spoke with patient and ICM intro given. Patient agreed to monthly ICM follow up.  She is doing well at this time.  Advised device report suggests possible fluid accumulation from 11/13 - 11/18.  She said she did have some feet/ankle swelling and weight gain at that time but is now resolved.  Advised to call if she experiences any fluid symptoms and will schedule 1st ICM remote transmission for 09/03/2019.  She is currently being managed by Dr Harl Bowie and Dr Rayann Heman.  Provided direct ICM number.  Patient has monitor at bedside and explained reports are transmitted between 12 midnight and 6 AM on scheduled date.  No questions at this time.

## 2019-08-20 ENCOUNTER — Other Ambulatory Visit: Payer: Self-pay | Admitting: Cardiology

## 2019-08-20 NOTE — Progress Notes (Signed)
Remote ICD transmission.   

## 2019-08-22 ENCOUNTER — Other Ambulatory Visit: Payer: Self-pay

## 2019-08-22 ENCOUNTER — Other Ambulatory Visit (HOSPITAL_COMMUNITY)
Admission: RE | Admit: 2019-08-22 | Discharge: 2019-08-22 | Disposition: A | Discharge status: Home / Self Care | Payer: Medicare HMO | Source: Ambulatory Visit | Attending: Cardiology | Admitting: Cardiology

## 2019-08-22 DIAGNOSIS — Z20828 Contact with and (suspected) exposure to other viral communicable diseases: Secondary | ICD-10-CM | POA: Insufficient documentation

## 2019-08-22 DIAGNOSIS — Z01812 Encounter for preprocedural laboratory examination: Secondary | ICD-10-CM | POA: Insufficient documentation

## 2019-08-22 LAB — SARS CORONAVIRUS 2 (TAT 6-24 HRS): SARS Coronavirus 2: NEGATIVE

## 2019-08-24 ENCOUNTER — Ambulatory Visit: Payer: Medicare HMO | Attending: Cardiology | Admitting: Cardiology

## 2019-08-24 ENCOUNTER — Other Ambulatory Visit: Payer: Self-pay

## 2019-08-24 DIAGNOSIS — G4733 Obstructive sleep apnea (adult) (pediatric): Secondary | ICD-10-CM | POA: Diagnosis not present

## 2019-08-24 DIAGNOSIS — G4731 Primary central sleep apnea: Secondary | ICD-10-CM | POA: Diagnosis not present

## 2019-08-27 NOTE — Procedures (Signed)
   Patient Name: [name], [name] Date: 08/24/2019 Gender: Female D.O.B: 19-Apr-1951 Age (years): 49 Referring Provider: Fransico Him MD, ABSM Height (inches): 65 Interpreting Physician: Fransico Him MD, ABSM Weight (lbs): 238 RPSGT: Peak, Robert BMI: 40 MRN: [id number] Neck Size: 18.00  CLINICAL INFORMATION The patient is referred for a BiPAP titration to treat sleep apnea.  SLEEP STUDY TECHNIQUE As per the AASM Manual for the Scoring of Sleep and Associated Events v2.3 (April 2016) with a hypopnea requiring 4% desaturations.  The channels recorded and monitored were frontal, central and occipital EEG, electrooculogram (EOG), submentalis EMG (chin), nasal and oral airflow, thoracic and abdominal wall motion, anterior tibialis EMG, snore microphone, electrocardiogram, and pulse oximetry. Bilevel positive airway pressure (BPAP) was initiated at the beginning of the study and titrated to treat sleep-disordered breathing.  MEDICATIONS Medications self-administered by patient taken the night of the study : N/A  RESPIRATORY PARAMETERS Optimal IPAP Pressure (cm): 15  AHI at Optimal Pressure (/hr) 0.0 Optimal EPAP Pressure (cm):11  Overall Minimal O2 (%):82.0  Minimal O2 at Optimal Pressure (%):88.0  SLEEP ARCHITECTURE Start Time:10:32:26 PM  Stop Time:5:27:35 AM  Total Time (min):415.2  Total Sleep Time (min):392.6 Sleep Latency (min):3.1  Sleep Efficiency (%):94.6%  REM Latency (min):136.0  WASO (min):19.5 Stage N1 (%):4.2%  Stage N2 (%):75.9%  Stage N3 (%):0.0%  Stage R (%):19.9 Supine (%):100.00 Arousal Index (/hr):8.1     CARDIAC DATA The 2 lead EKG demonstrated pacemaker generated. The mean heart rate was 77.4 beats per minute. Other EKG findings include: PVCs.  LEG MOVEMENT DATA The total Periodic Limb Movements of Sleep (PLMS) were 2. The PLMS index was 0.3. A PLMS index of <15 is considered normal in adults.  IMPRESSIONS - An optimal PAP pressure was  selected for this patient ( 15 /11cm of water) - Mild Central Sleep Apnea was noted during this titration (CAI = 9.8/h). - Moderete oxygen desaturations were observed during this titration (min O2 = 82.0%). - No snoring was audible during this study. - No cardiac abnormalities were observed during this study. - Clinically significant periodic limb movements were not noted during this study. Arousals associated with PLMs were rare.  DIAGNOSIS - Obstructive Sleep Apnea (327.23 [G47.33 ICD-10])  RECOMMENDATIONS - Trial of BiPAP therapy on 15/11 cm H2O with a Medium size Resmed Full Face Mask AirFit F30 mask and heated humidification. - Avoid alcohol, sedatives and other CNS depressants that may worsen sleep apnea and disrupt normal sleep architecture. - Sleep hygiene should be reviewed to assess factors that may improve sleep quality. - Weight management and regular exercise should be initiated or continued. - Return to Sleep Center for re-evaluation after 10 weeks of therapy  [Electronically signed] 08/27/2019 03:22 PM  Fransico Him MD, ABSM Diplomate, American Board of Sleep Medicine

## 2019-08-31 ENCOUNTER — Telehealth: Payer: Self-pay | Admitting: *Deleted

## 2019-08-31 NOTE — Telephone Encounter (Signed)
-----   Message from Sueanne Margarita, MD sent at 08/27/2019  3:25 PM EST ----- Please let patient know that they had a successful PAP titration and let DME know that orders are in EPIC.  Please set up 10 week OV with me.

## 2019-08-31 NOTE — Telephone Encounter (Addendum)
Informed patient of sleep study results and patient understanding was verbalized. Patient understands her sleep study showed they had a successful PAP titration and let DME know that orders are in EPIC. Please set up 10 week OV with me.   Upon patient request DME selection is [facility] Patient understands she will be contacted by [facility] to set up her cpap. Patient understands to call if [facility] does not contact her with new setup in a timely manner. Patient understands they will be called once confirmation has been received from [facility] that they have received their new machine to schedule 10 week follow up appointment.  [facility] notified of new cpap order  Please add to airview Patient was grateful for the call and thanked me.

## 2019-09-03 ENCOUNTER — Ambulatory Visit (INDEPENDENT_AMBULATORY_CARE_PROVIDER_SITE_OTHER): Payer: Medicare HMO

## 2019-09-03 ENCOUNTER — Telehealth: Payer: Self-pay

## 2019-09-03 DIAGNOSIS — Z95810 Presence of automatic (implantable) cardiac defibrillator: Secondary | ICD-10-CM | POA: Diagnosis not present

## 2019-09-03 DIAGNOSIS — I5022 Chronic systolic (congestive) heart failure: Secondary | ICD-10-CM | POA: Diagnosis not present

## 2019-09-03 NOTE — Progress Notes (Signed)
EPIC Encounter for ICM Monitoring  Patient Name: [name] is a 68 y.o. female Date: 09/03/2019 Primary Care Physican: [name], MD Primary Cardiologist: Branch Electrophysiologist: Allred Bi-V Pacing:   >99%  09/03/2019 Weight: unknown       1st ICM remote transmission.  Attempted call to patient and unable to reach.  Left message to return call. Transmission reviewed.    Corvue Thoracic impedance normal but was suggesting possible fluid accumulation from 08/26/2019 - 09/01/2019.   Prescribed: Furosemide 20 mg take 1 tablet by mouth daily.  Recommendations: Unable to reach.    Follow-up plan: ICM clinic phone appointment on 10/08/2019.   91 day device clinic remote transmission 11/12/2019.   Copy of ICM check sent to Dr. Rayann Heman.   3 month ICM trend: 09/03/2019    1 Year ICM trend:       [name], RN 09/03/2019 2:50 PM

## 2019-09-03 NOTE — Telephone Encounter (Signed)
Remote ICM transmission received.  Attempted call to patient regarding ICM remote transmission and left detailed message per DPR to return call.   

## 2019-09-03 NOTE — Progress Notes (Signed)
Spoke with patient and she is doing well. She voices no complaints at this time.  Most current weight is 244 lbs and that can fluctuate by 3 lbs.  Advised to call if she has more than 3 lbs weight gain that does not return to baseline.  No changes and encouraged to call if experiencing any fluid symptoms.  Next remote transmission 10/08/2019.

## 2019-09-19 ENCOUNTER — Other Ambulatory Visit: Payer: Self-pay | Admitting: Cardiology

## 2019-09-24 DIAGNOSIS — G4733 Obstructive sleep apnea (adult) (pediatric): Secondary | ICD-10-CM | POA: Diagnosis not present

## 2019-10-08 ENCOUNTER — Ambulatory Visit (INDEPENDENT_AMBULATORY_CARE_PROVIDER_SITE_OTHER): Payer: Medicare HMO

## 2019-10-08 DIAGNOSIS — I5022 Chronic systolic (congestive) heart failure: Secondary | ICD-10-CM

## 2019-10-08 DIAGNOSIS — Z95810 Presence of automatic (implantable) cardiac defibrillator: Secondary | ICD-10-CM | POA: Diagnosis not present

## 2019-10-08 NOTE — Progress Notes (Signed)
EPIC Encounter for ICM Monitoring  Patient Name: [name] is a 69 y.o. female Date: 10/08/2019 Primary Care Physican: [name], MD Primary Cardiologist: Branch Electrophysiologist: Allred Bi-V Pacing:   >99%        10/08/2019 Weight: 244 lbs                                                           Spoke with patient and denies fluid symptoms. She weighs every couple of weeks and is not strict on salt intake.   Corvue Thoracic impedance suggesting possible fluid accumulation since 09/24/2019.   Prescribed: Furosemide 20 mg take 1 tablet by mouth daily.  Labs: 04/30/2019 Creatinine 0.96, BUN 19, Potassium 4.1, Sodium 137, GFR 60->60  Recommendations: Advised to take Furosemide 2 tablets daily x 2-3 days and then return to prescribed dosage. If she has any difficulty with taking extra Furosemide then call back to the office. Encouraged to weigh several times a week, review food labels and restrict salt intake to 2000 mg daily.   Follow-up plan: ICM clinic phone appointment on 10/16/2019 to recheck fluid levels.   91 day device clinic remote transmission 11/12/2019.   Copy of ICM check sent to Dr. Rayann Heman.   3 month ICM trend: 10/08/2019    1 Year ICM trend:       [name], RN 10/08/2019 10:18 AM

## 2019-10-16 ENCOUNTER — Encounter: Payer: Self-pay | Admitting: Cardiology

## 2019-10-16 ENCOUNTER — Ambulatory Visit (INDEPENDENT_AMBULATORY_CARE_PROVIDER_SITE_OTHER): Payer: Medicare HMO

## 2019-10-16 ENCOUNTER — Telehealth (INDEPENDENT_AMBULATORY_CARE_PROVIDER_SITE_OTHER): Payer: Medicare HMO | Admitting: Cardiology

## 2019-10-16 VITALS — BP 120/75 | HR 88 | Ht 65.0 in | Wt 242.0 lb

## 2019-10-16 DIAGNOSIS — E782 Mixed hyperlipidemia: Secondary | ICD-10-CM

## 2019-10-16 DIAGNOSIS — I2510 Atherosclerotic heart disease of native coronary artery without angina pectoris: Secondary | ICD-10-CM

## 2019-10-16 DIAGNOSIS — I5022 Chronic systolic (congestive) heart failure: Secondary | ICD-10-CM

## 2019-10-16 DIAGNOSIS — Z95810 Presence of automatic (implantable) cardiac defibrillator: Secondary | ICD-10-CM

## 2019-10-16 NOTE — Progress Notes (Signed)
EPIC Encounter for ICM Monitoring  Patient Name: [name] is a 69 y.o. female Date: 10/16/2019 Primary Care Physican: [name], MD Primary Cardiologist:Branch Electrophysiologist:Allred Bi-V Pacing:>99% 10/16/2019 Weight: 242 lbs   Spoke with patient and denies fluid symptoms. She weighs every couple of weeks and is not strict on salt intake.  CorvueThoracic impedance returned to baseline normal since 10/08/2019 remote transmission.   Prescribed: Furosemide20 mg take 1 tablet by mouth daily and per Dr Nelly Laurence 10/08/19 OV note, she may take 40 mg as needed.  Labs: 04/30/2019 Creatinine 0.96, BUN 19, Potassium 4.1, Sodium 137, GFR 60->60  Recommendations: No changes and encouraged to call if experiencing any fluid symptoms.  Follow-up plan: ICM clinic phone appointment on 11/13/2019.   91 day device clinic remote transmission 11/12/2019.      Copy of ICM check sent to Dr. Rayann Heman.   3 month ICM trend: 10/16/2019    1 Year ICM trend:       [name], RN 10/16/2019 2:16 PM

## 2019-10-16 NOTE — Patient Instructions (Signed)
Your physician recommends that you schedule a follow-up appointment in: 4 MONTHS WITH DR BRANCH  Your physician recommends that you continue on your current medications as directed. Please refer to the Current Medication list given to you today.  Thank you for choosing [facility]!!    

## 2019-10-16 NOTE — Progress Notes (Signed)
Virtual Visit via Telephone Note   This visit type was conducted due to national recommendations for restrictions regarding the COVID-19 Pandemic (e.g. social distancing) in an effort to limit this patient's exposure and mitigate transmission in our community.  Due to her co-morbid illnesses, this patient is at least at moderate risk for complications without adequate follow up.  This format is felt to be most appropriate for this patient at this time.  The patient did not have access to video technology/had technical difficulties with video requiring transitioning to audio format only (telephone).  All issues noted in this document were discussed and addressed.  No physical exam could be performed with this format.  Please refer to the patient's chart for her  consent to telehealth for [facility].   Date:  10/16/2019   ID:  [name], DOB 1951/04/09, MRN [id number]  Patient Location: Home Provider Location: Office  PCP:  [name], MD  Cardiologist:  [name], MD  Electrophysiologist:  None   Evaluation Performed:  Follow-Up Visit  Chief Complaint:  Follow up  History of Present Illness:    [name] is a 69 y.o. female seen today for follow up of the following meidcal problems.   1. Chronic combined systolicand diastolic heart failure - echo 03/2014 LVEF 25-30%, restrictive diastolic dysfunction. New diagnosis at that time. Repeat echo 06/2014 LVEF 40-45% - cath 03/2014 showed RCA 99% with left to right collaterals, other arteries patent. Overall LV systolic dysfunction out of proportion to CAD. Medically managed.  - 07/2015 echo LVEF [id number], grade I diastolic dysfunction - Entresto causes dizziness and nausea, stopped taking. Back on losartan - due to low bp's we lowered losartan to 25mg  daily. Lowered lasix to 20mg  daily, may take 40mg  as needed.   01/2018 echo LVEF 35%, grade I diastoilc dysfunction - 11/2018 echo LVEF 30-35% - 05/03/19 had BIV  AICD placed by Dr Rayann Heman   - no significant SOB or DOE. No recent edema - compliant with meds - weights down 246-->242 lbs - 07/2019 device check normal  2. CAD - cath 03/2014 with 99% chronic RCA disease, otherwise patent vessels. Managed medically  -no recent chest pain  3. OSA  - on bipap at night. Followed by Dr Radford Pax.   4. Hyperlipidemia -compliant with statin  5. Chronic LBBB     The patient does not have symptoms concerning for COVID-19 infection (fever, chills, cough, or new shortness of breath).    Past Medical History:  Diagnosis Date  . Anemia   . Arthritis   . CAD (coronary artery disease)    a. LHC (03/2014) Lmain: nl, LAD: diff dz proximal 20%, 30-40% dz mid vessel prior 2nd diagonal, LCx: 40% in OM1, 20-30% in OM2, RCA: 99% subtotal occlusion @ crux, TIMI 1 flow, L-R collaterals to distal vessel  . Chronic systolic heart failure ([place])    a. ECHO (03/2014): EF 25-30%, akinesis enteroanteroseptal myocardium, grade III DD b. RHC (03/2014) RA 4, RV 42/4, PA 44/14 (26), PCWP 21, PA 62% Fick CO/CI 6.9 / 3.4  . Diabetes mellitus   . Duodenal ulcer    Age 69  . Erosive esophagitis   . Gastritis   . Hypertension   . Ischemic cardiomyopathy   . Obese   . Short-term memory loss   . Thyroid disease   . TTP (thrombotic thrombocytopenic purpura) (HCC)    Past Surgical History:  Procedure Laterality Date  . BIV ICD INSERTION CRT-D N/A 05/03/2019  Procedure: BIV ICD INSERTION CRT-D;  Surgeon: Thompson Grayer, MD;  Location: [place] [facility];  Service: Cardiovascular;  Laterality: N/A;  . COLONOSCOPY  08/29/2012   [id number] diverticulosis  . COLONOSCOPY N/A 05/28/2015   Procedure: COLONOSCOPY;  Surgeon: Daneil Dolin, MD;  Location: [facility];  Service: Endoscopy;  Laterality: N/A;  1015  . ECTOPIC PREGNANCY SURGERY    . ESOPHAGOGASTRODUODENOSCOPY  04/20/2012   [id number] antral gastritis/Erosive reflux esophagitis  . LEFT AND RIGHT HEART  CATHETERIZATION WITH CORONARY ANGIOGRAM N/A 03/28/2014   Procedure: LEFT AND RIGHT HEART CATHETERIZATION WITH CORONARY ANGIOGRAM;  Surgeon: Peter M Martinique, MD;  Location: [facility] C[facility] LAB;  Service: Cardiovascular;  Laterality: N/A;  . SPLENECTOMY, PARTIAL       Current Meds  Medication Sig  . acetaminophen (TYLENOL) 500 MG tablet Take 1,000 mg by mouth every 6 (six) hours as needed for moderate pain.   Marland Kitchen aspirin 81 MG chewable tablet Chew 1 tablet (81 mg total) by mouth daily.  Marland Kitchen atorvastatin (LIPITOR) 80 MG tablet TAKE 1 TABLET (80 MG TOTAL) BY MOUTH DAILY.  . carvedilol (COREG) 25 MG tablet TAKE 1 TABLET TWICE DAILY  . furosemide (LASIX) 20 MG tablet Take 1 tablet (20 mg total) by mouth daily.  . insulin glargine (LANTUS) 100 UNIT/ML injection Inject 40 Units into the skin 2 (two) times daily.  Marland Kitchen levothyroxine (SYNTHROID, LEVOTHROID) 25 MCG tablet Take 25 mcg by mouth daily before breakfast.  . losartan (COZAAR) 25 MG tablet TAKE 1 TABLET (25 MG TOTAL) BY MOUTH DAILY.  . Multiple Vitamin (MULTIVITAMIN) tablet Take 1 tablet by mouth daily.  Marland Kitchen NOVOLOG 100 UNIT/ML injection Inject 10 Units into the skin 3 (three) times daily with meals.   . polyethylene glycol (MIRALAX / GLYCOLAX) packet Take 17 g by mouth daily as needed for mild constipation.   Marland Kitchen spironolactone (ALDACTONE) 25 MG tablet Take 1 tablet (25 mg total) by mouth daily.     Allergies:   Other and Lisinopril   Social History   Tobacco Use  . Smoking status: Former Smoker    Packs/day: 0.25    Years: 10.00    Pack years: 2.50    Types: Cigarettes    Start date: 09/20/1966    Quit date: 09/20/1978    Years since quitting: 41.0  . Smokeless tobacco: Never Used  . Tobacco comment: Quit 2008  Substance Use Topics  . Alcohol use: No    Alcohol/week: 0.0 standard drinks  . Drug use: No     Family Hx: The patient's family history includes Cirrhosis in her father; Diabetes in her daughter; Heart failure in her mother. There is no  history of Colon cancer.  ROS:   Please see the history of present illness.     All other systems reviewed and are negative.   Prior CV studies:   The following studies were reviewed today:   03/2014 echo Study Conclusions  - Left ventricle: The cavity size was normal. There was mild focal basal hypertrophy of the septum. Systolic function was severely reduced. The estimated ejection fraction was in the range of 25% to 30%. There is akinesis of the entireanteroseptal myocardium. Doppler parameters are consistent with a reversible restrictive pattern, indicative of decreased left ventricular diastolic compliance and/or increased left atrial pressure (grade 3 diastolic dysfunction). - Mitral valve: There was mild regurgitation. - Left atrium: The atrium was moderately dilated. 03/2014 Cath Procedural Findings:  Hemodynamics  RA 8/0 mean 4 mm Hg  RV  42/4 mm Hg  PA 44/14 mean 26 mm Hg  PCWP 20/28 mean 21 mm Hg  LV 98/18 mm Hg  AO 95/54 mean 72 mm Hg  Oxygen saturations:  PA 62%  AO 90%  Cardiac Output (Fick) 6.9 L/min  Cardiac Index (Fick) 3.4 L/min/meter squared  Coronary angiography:  Coronary dominance: right  Left mainstem: Normal  Left anterior descending (LAD): Diffuse disease in the proximal vessel up to 20%. 30-40% disease in the mid vessel prior to the second diagonal.  Left circumflex (LCx): 40% in OM1. 20-30% in OM2. Otherwise no significant disease.  Right coronary artery (RCA): 99% subtotal occlusion at the crux. TIMI 1 flow. Left to right collaterals to the distal vessel.  Left ventriculography: Left ventricular systolic function is abnormal, LVEF is estimated at 25%. There is inferior akinesis and severe global hypokinesis. There is mild mitral regurgitation  Final Conclusions:  1. Single vessel obstructive CAD with subtotal occlusion of the RCA at the crux.  2. Severe LV dysfunction.  3. Normal Right heart pressures. Elevated PCWP.   Recommendations: Medical management. Her degree of LV dysfunction is out of proportion to her CAD. She has no anginal symptoms so PCI of RCA not indicated. The inferior wall appears scarred.  07/2015 echo Study Conclusions  - Left ventricle: The cavity size was at the upper limits of normal. Wall thickness was increased in a pattern of moderate LVH. The estimated ejection fraction was 40% (similar calculation per biplane speckle tracking, reduced global longitudinal strain of -14.3%). There is akinesis of the basalinferolateral and inferior myocardium. Doppler parameters are consistent with abnormal left ventricular relaxation (grade 1 diastolic dysfunction). - Aortic valve: Mildly calcified annulus. Trileaflet. - Mitral valve: Calcified annulus. There was trivial regurgitation. - Left atrium: The atrium was at the upper limits of normal in size. - Right atrium: Central venous pressure (est): 3 mm Hg. - Atrial septum: A patent foramen ovale cannot be excluded. - Tricuspid valve: There was trivial regurgitation. - Pulmonary arteries: Systolic pressure could not be accurately estimated. - Pericardium, extracardiac: There was no pericardial effusion.  Impressions:  - Upper normal LV chamber size with moderate LVH and LVEF approximately 40% as discussed above. There is akinesis of the basal inferoseptal and inferior myocardium. Grade 1 diastolic dysfunction. Compared to the previous study from October 2015, LVEF is in similar range, perhaps slightly reduced. Upper normal left atrial chamber size. Trivial tricuspid regurgitation. Cannot exclude PFO based on limited images.   01/2018 echo Study Conclusions  - Left ventricle: The cavity size was normal. Wall thickness was increased in a pattern of mild LVH. The estimated ejection fraction was 35%. There is akinesis of the basal-midinferior myocardium. There is akinesis of the  mid-apicalanteroseptal and apical myocardium. Doppler parameters are consistent with abnormal left ventricular relaxation (grade 1 diastolic dysfunction). - Ventricular septum: Septal motion showed abnormal function and dyssynergy suggestive of left bundle Whitaker Holderman block. - Aortic valve: Moderately calcified annulus. Trileaflet. - Mitral valve: There was mild regurgitation. - Right atrium: Central venous pressure (est): 3 mm Hg. - Atrial septum: No defect or patent foramen ovale was identified. - Tricuspid valve: There was trivial regurgitation. - Pulmonary arteries: Systolic pressure could not be accurately estimated. - Pericardium, extracardiac: There was no pericardial effusion   11/2018 echo 1. The left ventricle has moderate-severely reduced systolic function, with an ejection fraction of 30-35%. The cavity size was normal. There is mildly increased left ventricular wall thickness. Left ventricular diastolic Doppler parameters are  consistent  with impaired relaxation. Elevated mean left atrial pressure. 2. The anteroseptal, anterolateral, apical walls are hypokinetic. 3. The right ventricle has normal systolic function. The cavity was normal. There is no increase in right ventricular wall thickness. 4. Left atrial size was moderately dilated. 5. No evidence of mitral valve stenosis. 6. The aortic valve is tricuspid no stenosis of the aortic valve. 7. Pulmonary hypertension is indeterminant, inadequate TR jet. 8. The inferior vena cava was dilated in size with <50% respiratory variability. 9. The interatrial septum was not well visualized.  Labs/Other Tests and Data Reviewed:    EKG:  No ECG reviewed.  Recent Labs: 04/30/2019: BUN 19; Creatinine, Ser 0.96; Hemoglobin 11.2; Platelets 170; Potassium 4.1; Sodium 137   Recent Lipid Panel No results found for: CHOL, TRIG, HDL, CHOLHDL, LDLCALC, LDLDIRECT  Wt Readings from Last 3 Encounters:  10/16/19 242 lb  (109.8 kg)  08/03/19 244 lb (110.7 kg)  06/27/19 244 lb (110.7 kg)     Objective:    Vital Signs:  BP 120/75   Pulse 88   Ht 5\' 5"  (1.651 m)   Wt 242 lb (109.8 kg)   BMI 40.27 kg/m    Normal affect. Normal speech pattern and tone. Comfortable, no apparent distress. No audible signs of SOB or wheezing.   ASSESSMENT & PLAN:    1. Chronic combined systolic/diastolic HF - medical therapy limited by soft bp's. Did not tolerate entresto. She is on her maximally tolerated regimen -s/p BIV AICD  - repeat echo at some point s/pt BIV AICD, not urgent and with ongoign COVID risks continue to hold off at this time - continue current mes  2. CAD - medically managed single vessel RCA disease - no recent symptoms, continue curren tmeds  3.OSA -continue to f/u with Dr Radford Pax  4. Hyperlipidemia - continue statin.   COVID-19 Education: The signs and symptoms of COVID-19 were discussed with the patient and how to seek care for testing (follow up with PCP or arrange E-visit).  The importance of social distancing was discussed today.  Time:   Today, I have spent 16 minutes with the patient with telehealth technology discussing the above problems.     Medication Adjustments/Labs and Tests Ordered: Current medicines are reviewed at length with the patient today.  Concerns regarding medicines are outlined above.   Tests Ordered: No orders of the defined types were placed in this encounter.   Medication Changes: No orders of the defined types were placed in this encounter.   Follow Up:  Either In Person or Virtual in 4 month(s)  Signed, [name], MD  10/16/2019 11:55 AM    [place]

## 2019-10-24 ENCOUNTER — Other Ambulatory Visit: Payer: Self-pay | Admitting: Cardiology

## 2019-10-25 DIAGNOSIS — G4733 Obstructive sleep apnea (adult) (pediatric): Secondary | ICD-10-CM | POA: Diagnosis not present

## 2019-11-05 ENCOUNTER — Other Ambulatory Visit: Payer: Self-pay

## 2019-11-05 ENCOUNTER — Ambulatory Visit (HOSPITAL_COMMUNITY): Payer: Medicare HMO | Attending: Cardiology

## 2019-11-05 DIAGNOSIS — I5042 Chronic combined systolic (congestive) and diastolic (congestive) heart failure: Secondary | ICD-10-CM | POA: Diagnosis not present

## 2019-11-06 ENCOUNTER — Other Ambulatory Visit: Payer: Self-pay | Admitting: Student

## 2019-11-06 DIAGNOSIS — I420 Dilated cardiomyopathy: Secondary | ICD-10-CM

## 2019-11-12 ENCOUNTER — Ambulatory Visit (INDEPENDENT_AMBULATORY_CARE_PROVIDER_SITE_OTHER): Payer: Medicare HMO | Admitting: *Deleted

## 2019-11-12 DIAGNOSIS — I420 Dilated cardiomyopathy: Secondary | ICD-10-CM

## 2019-11-12 LAB — CUP PACEART REMOTE DEVICE CHECK
Battery Remaining Longevity: 68 mo
Battery Remaining Percentage: 89 %
Battery Voltage: 3.1 V
Brady Statistic AP VP Percent: 1 %
Brady Statistic AP VS Percent: 1 %
Brady Statistic AS VP Percent: 99 %
Brady Statistic AS VS Percent: 1 %
Brady Statistic RA Percent Paced: 1 %
Date Time Interrogation Session: 20210222020016
HighPow Impedance: 72 Ohm
HighPow Impedance: 72 Ohm
Implantable Lead Implant Date: 20200813
Implantable Lead Implant Date: 20200813
Implantable Lead Implant Date: 20200813
Implantable Lead Location: 753858
Implantable Lead Location: 753859
Implantable Lead Location: 753860
Implantable Pulse Generator Implant Date: 20200813
Lead Channel Impedance Value: 450 Ohm
Lead Channel Impedance Value: 450 Ohm
Lead Channel Impedance Value: 490 Ohm
Lead Channel Pacing Threshold Amplitude: 0.5 V
Lead Channel Pacing Threshold Amplitude: 0.5 V
Lead Channel Pacing Threshold Amplitude: 1.375 V
Lead Channel Pacing Threshold Pulse Width: 0.4 ms
Lead Channel Pacing Threshold Pulse Width: 0.5 ms
Lead Channel Pacing Threshold Pulse Width: 0.6 ms
Lead Channel Sensing Intrinsic Amplitude: 12 mV
Lead Channel Sensing Intrinsic Amplitude: 5 mV
Lead Channel Setting Pacing Amplitude: 2 V
Lead Channel Setting Pacing Amplitude: 2.375
Lead Channel Setting Pacing Amplitude: 3.5 V
Lead Channel Setting Pacing Pulse Width: 0.5 ms
Lead Channel Setting Pacing Pulse Width: 0.6 ms
Lead Channel Setting Sensing Sensitivity: 0.5 mV
Pulse Gen Serial Number: 9891600

## 2019-11-13 ENCOUNTER — Ambulatory Visit (INDEPENDENT_AMBULATORY_CARE_PROVIDER_SITE_OTHER): Payer: Medicare HMO

## 2019-11-13 DIAGNOSIS — I5022 Chronic systolic (congestive) heart failure: Secondary | ICD-10-CM | POA: Diagnosis not present

## 2019-11-13 DIAGNOSIS — Z95810 Presence of automatic (implantable) cardiac defibrillator: Secondary | ICD-10-CM | POA: Diagnosis not present

## 2019-11-13 NOTE — Progress Notes (Signed)
ICD Remote  

## 2019-11-16 NOTE — Progress Notes (Signed)
EPIC Encounter for ICM Monitoring  Patient Name: [name] is a 69 y.o. female Date: 11/16/2019 Primary Care Physican: [name], MD Primary Cardiologist:Branch Electrophysiologist:Allred Bi-V Pacing:>99% 2/26/2021Weight:242 lbs   Spoke with patient and denies fluid symptoms. She weighs every couple of weeks and is not strict on salt intake.  CorvueThoracic impedance normal.   Prescribed: Furosemide20 mg take 1 tablet by mouth daily and per Dr Nelly Laurence 10/08/19 OV note, she may take 40 mg as needed.  Labs: 08/10/2020Creatinine 0.96, BUN 19, Potassium 4.1, Sodium 137, GFR 60->60  Recommendations: No changes and encouraged to call if experiencing any fluid symptoms.  Follow-up plan: ICM clinic phone appointment on 12/17/2019.   91 day device clinic remote transmission 02/11/2020.      Copy of ICM check sent to Dr. Rayann Heman.   3 month ICM trend: 11/12/2019    1 Year ICM trend:       [name], RN 11/16/2019 2:56 PM

## 2019-11-19 ENCOUNTER — Telehealth: Payer: Self-pay | Admitting: Student

## 2019-11-19 NOTE — Telephone Encounter (Signed)
New message   Patient states that her son will come to her to the office appt with Barrington Ellison because she has difficulty walking. Please advise.

## 2019-11-20 ENCOUNTER — Ambulatory Visit: Payer: Medicare HMO | Admitting: Student

## 2019-11-20 NOTE — Telephone Encounter (Signed)
I spoke with the patient and she will need her son's assistance to the Echo appointment due to mobility.

## 2019-11-21 ENCOUNTER — Other Ambulatory Visit: Payer: Self-pay

## 2019-11-21 ENCOUNTER — Ambulatory Visit (INDEPENDENT_AMBULATORY_CARE_PROVIDER_SITE_OTHER): Payer: Medicare HMO | Admitting: Student

## 2019-11-21 ENCOUNTER — Ambulatory Visit (HOSPITAL_COMMUNITY): Payer: Medicare HMO | Attending: Cardiovascular Disease

## 2019-11-21 DIAGNOSIS — I420 Dilated cardiomyopathy: Secondary | ICD-10-CM | POA: Insufficient documentation

## 2019-11-21 DIAGNOSIS — I5022 Chronic systolic (congestive) heart failure: Secondary | ICD-10-CM | POA: Diagnosis not present

## 2019-11-21 DIAGNOSIS — Z95810 Presence of automatic (implantable) cardiac defibrillator: Secondary | ICD-10-CM

## 2019-11-21 LAB — CUP PACEART INCLINIC DEVICE CHECK
Date Time Interrogation Session: 20210303124006
Implantable Lead Implant Date: 20200813
Implantable Lead Implant Date: 20200813
Implantable Lead Implant Date: 20200813
Implantable Lead Location: 753858
Implantable Lead Location: 753859
Implantable Lead Location: 753860
Implantable Pulse Generator Implant Date: 20200813
Pulse Gen Serial Number: 9891600

## 2019-11-21 NOTE — Progress Notes (Signed)
    Electrophysiology CRT AV optimization   Date: 11/21/2019  ID:  [name], DOB Mar 06, 1951, MRN [id number]  PCP: [name], MD Primary Cardiologist: [name], MD Electrophysiologist: Dr. Rayann Heman  CC: Heart failure despite LV lead placement  [employer] CRT-D implanted 04/2019 for chronic systolic CHF History of appropriate therapy: No History of AAD therapy: No  LV lead History: Model: Quartet 1458Q  Location: through the MB-2 into the distal posterolateral branch (approx 1/3 from the base to the apex in a lateral position) Threshold 1.5V @ 0.6 ms Vector P4 - RVc Revisions/CXR: Post op CXR stable Diaphragmatic Stim: None VV timing: Simultaneous Prior optimzation/changes: N/A  Echocardiogram: Pre-device implant: 12/07/2018 LVEF 30-35% Post-device implant: 11/05/2019 LVEF 30%  EKG: Pre-device implant: NSR 69 bpm, QRS 144 ms Post-device implant: BiV pacing 84 bpm, QRS 138 ms  ICD interrogation: CRT pacing: >99%% AF: 0% PVC burden: <1% Thoracic impedence: Slightly depressed today Activity Level: ~ 2 hrs daily HR excursion: Histograms appropriate for patient activity See PaceArt report for full details.   EKG:  EKG is not ordered today. Consider VV optimization at later date based on response to AV optimization.   Assessment and Plan:  1.  Chronic systolic heart failure Patient reports overall stable symptoms post CRT implant Device interrogation today demonstrates normal and appropriate device function RA turned down to appropriate safety margin of 2.0V @ 0.5 ms from post op safety margin.  Pt examined under echo with assistance from industry rep.  No other suitable vectors present due to high thresholds. No PNS noted during testing.   Pt intrinsic PR interval 148-152 ms. Observation under echo showed no improvement of E-A wave relationship with adjustment down to 100 ms and up to 150 ms.   Were unable to improve VTI with adjustment of V-V  timing from simultaneous in 10 ms intervals all the way up to LV first by 50 ms (showed worsening VTI)  No suitable optimization options for pt CRT at this time. Could consider changing losartan to [facility] +/- addition of Bidil.   Disposition:   FU with EP and Dr. Harl Bowie as scheduled.    [name], PA-C  11/21/2019 12:44 PM  [place] [address] [phone] (office) [phone] (fax

## 2019-11-22 DIAGNOSIS — G4733 Obstructive sleep apnea (adult) (pediatric): Secondary | ICD-10-CM | POA: Diagnosis not present

## 2019-11-28 ENCOUNTER — Other Ambulatory Visit: Payer: Self-pay | Admitting: Cardiology

## 2019-12-01 ENCOUNTER — Ambulatory Visit: Payer: Medicare HMO | Attending: Internal Medicine

## 2019-12-01 DIAGNOSIS — Z23 Encounter for immunization: Secondary | ICD-10-CM

## 2019-12-01 NOTE — Progress Notes (Signed)
   Covid-19 Vaccination Clinic  Name:  EMILIANA HASCALL    MRN: [id number] DOB: 22-Aug-1951  12/01/2019  Ms. Reissig was observed post Covid-19 immunization for 15 minutes without incident. She was provided with Vaccine Information Sheet and instruction to access the V-Safe system.   Ms. Hardey was instructed to call 911 with any severe reactions post vaccine: Marland Kitchen Difficulty breathing  . Swelling of face and throat  . A fast heartbeat  . A bad rash all over body  . Dizziness and weakness   Immunizations Administered    Name Date Dose VIS Date Route   Moderna COVID-19 Vaccine 12/01/2019 11:57 AM 0.5 mL 08/21/2019 Intramuscular   Manufacturer: Moderna   Lot: [id number]   [id number]

## 2019-12-17 ENCOUNTER — Ambulatory Visit (INDEPENDENT_AMBULATORY_CARE_PROVIDER_SITE_OTHER): Payer: Medicare HMO

## 2019-12-17 DIAGNOSIS — Z95810 Presence of automatic (implantable) cardiac defibrillator: Secondary | ICD-10-CM | POA: Diagnosis not present

## 2019-12-17 DIAGNOSIS — I5022 Chronic systolic (congestive) heart failure: Secondary | ICD-10-CM | POA: Diagnosis not present

## 2019-12-19 ENCOUNTER — Telehealth: Payer: Self-pay

## 2019-12-19 NOTE — Telephone Encounter (Signed)
Remote ICM transmission received.  Attempted call to patient regarding ICM remote transmission and left message to return call   

## 2019-12-19 NOTE — Progress Notes (Signed)
EPIC Encounter for ICM Monitoring  Patient Name: [name] is a 69 y.o. female Date: 12/19/2019 Primary Care Physican: [name], MD Primary Cardiologist:Branch Electrophysiologist:Allred Bi-V Pacing:>99% 2/26/2021Weight:242lbs   Attempted call to patient and unable to reach.  Transmission reviewed.   CorvueThoracic impedancenormal.   Prescribed: Furosemide20 mg take 1 tablet by mouth dailyand per Dr Nelly Laurence 10/08/19 OV note, she may take 40 mg as needed.  Labs: 08/10/2020Creatinine 0.96, BUN 19, Potassium 4.1, Sodium 137, GFR 60->60  Recommendations: Unable to reach.    Follow-up plan: ICM clinic phone appointment on5/11/2019. 91 day device clinic remote transmission 02/11/2020.   Copy of ICM check sent to Dr.Allred.   3 month ICM trend: 12/17/2019    1 Year ICM trend:       [name], RN 12/19/2019 4:00 PM

## 2020-01-02 ENCOUNTER — Ambulatory Visit: Payer: Medicare HMO | Attending: Internal Medicine

## 2020-01-02 DIAGNOSIS — Z23 Encounter for immunization: Secondary | ICD-10-CM

## 2020-01-02 NOTE — Progress Notes (Signed)
   Covid-19 Vaccination Clinic  Name:  [name]    MRN: [id number] DOB: November 28, 1950  01/02/2020  [name] was observed post Covid-19 immunization for 30 minutes based on pre-vaccination screening without incident. She was provided with Vaccine Information Sheet and instruction to access the V-Safe system.   [name] was instructed to call 911 with any severe reactions post vaccine: Marland Kitchen Difficulty breathing  . Swelling of face and throat  . A fast heartbeat  . A bad rash all over body  . Dizziness and weakness   Immunizations Administered    Name Date Dose VIS Date Route   Moderna COVID-19 Vaccine 01/02/2020 11:47 AM 0.5 mL 08/21/2019 Intramuscular   Manufacturer: Moderna   Lot: [id number]   [place]:[id number]

## 2020-01-07 ENCOUNTER — Other Ambulatory Visit: Payer: Self-pay | Admitting: *Deleted

## 2020-01-07 MED ORDER — FUROSEMIDE 20 MG PO TABS: 20.0000 mg | ORAL_TABLET | Freq: Every day | ORAL | 2 refills | Status: DC

## 2020-01-09 DIAGNOSIS — G4733 Obstructive sleep apnea (adult) (pediatric): Secondary | ICD-10-CM | POA: Diagnosis not present

## 2020-01-10 ENCOUNTER — Other Ambulatory Visit: Payer: Self-pay | Admitting: *Deleted

## 2020-01-10 MED ORDER — ATORVASTATIN CALCIUM 80 MG PO TABS: 80.0000 mg | ORAL_TABLET | Freq: Every day | ORAL | 1 refills | Status: DC

## 2020-01-19 DIAGNOSIS — G4733 Obstructive sleep apnea (adult) (pediatric): Secondary | ICD-10-CM | POA: Diagnosis not present

## 2020-01-21 ENCOUNTER — Ambulatory Visit (INDEPENDENT_AMBULATORY_CARE_PROVIDER_SITE_OTHER): Payer: Medicare HMO

## 2020-01-21 DIAGNOSIS — Z95810 Presence of automatic (implantable) cardiac defibrillator: Secondary | ICD-10-CM

## 2020-01-21 DIAGNOSIS — I5022 Chronic systolic (congestive) heart failure: Secondary | ICD-10-CM

## 2020-01-22 NOTE — Progress Notes (Signed)
EPIC Encounter for ICM Monitoring  Patient Name: [name] is a 69 y.o. female Date: 01/22/2020 Primary Care Physican: [name], MD Primary Cardiologist:Branch Electrophysiologist:Allred Bi-V Pacing:>99% 5/4/2021Weight:242lbs   Spoke with patient.  She did have some days she had weight gain to 244-245 lb.   CorvueThoracic impedancenormal but was suggesting possible fluid accumulation 3/29-4/10 and 4/20 - 4/25.   Prescribed: Furosemide20 mg take 1 tablet by mouth dailyand per Dr Nelly Laurence 10/08/19 OV note, she may take 40 mg as needed.  Labs: 08/10/2020Creatinine 0.96, BUN 19, Potassium 4.1, Sodium 137, GFR 60->60  Recommendations: Recommendation to limit salt intake to 2000 mg daily and fluid intake to 64 oz daily.  Encouraged to call if experiencing any fluid symptoms.      Follow-up plan: ICM clinic phone appointment on 02/25/2020. 91 day device clinic remote transmission5/24/2021.   Copy of ICM check sent to Dr.Allred.  3 month ICM trend: 01/21/2020    1 Year ICM trend:       [name], RN 01/22/2020 1:59 PM

## 2020-02-11 ENCOUNTER — Ambulatory Visit (INDEPENDENT_AMBULATORY_CARE_PROVIDER_SITE_OTHER): Payer: Medicare HMO | Admitting: *Deleted

## 2020-02-11 DIAGNOSIS — I255 Ischemic cardiomyopathy: Secondary | ICD-10-CM

## 2020-02-11 LAB — CUP PACEART REMOTE DEVICE CHECK
Battery Remaining Longevity: 61 mo
Battery Remaining Percentage: 85 %
Battery Voltage: 3.02 V
Brady Statistic AP VP Percent: 1 %
Brady Statistic AP VS Percent: 1 %
Brady Statistic AS VP Percent: 99 %
Brady Statistic AS VS Percent: 1 %
Brady Statistic RA Percent Paced: 1 %
Date Time Interrogation Session: 20210524075127
HighPow Impedance: 72 Ohm
HighPow Impedance: 72 Ohm
Implantable Lead Implant Date: 20200813
Implantable Lead Implant Date: 20200813
Implantable Lead Implant Date: 20200813
Implantable Lead Location: 753858
Implantable Lead Location: 753859
Implantable Lead Location: 753860
Implantable Pulse Generator Implant Date: 20200813
Lead Channel Impedance Value: 430 Ohm
Lead Channel Impedance Value: 460 Ohm
Lead Channel Impedance Value: 490 Ohm
Lead Channel Pacing Threshold Amplitude: 0.5 V
Lead Channel Pacing Threshold Amplitude: 0.625 V
Lead Channel Pacing Threshold Amplitude: 1.875 V
Lead Channel Pacing Threshold Pulse Width: 0.4 ms
Lead Channel Pacing Threshold Pulse Width: 0.5 ms
Lead Channel Pacing Threshold Pulse Width: 0.6 ms
Lead Channel Sensing Intrinsic Amplitude: 12 mV
Lead Channel Sensing Intrinsic Amplitude: 5 mV
Lead Channel Setting Pacing Amplitude: 2 V
Lead Channel Setting Pacing Amplitude: 2 V
Lead Channel Setting Pacing Amplitude: 2.875
Lead Channel Setting Pacing Pulse Width: 0.5 ms
Lead Channel Setting Pacing Pulse Width: 0.6 ms
Lead Channel Setting Sensing Sensitivity: 0.5 mV
Pulse Gen Serial Number: 9891600

## 2020-02-12 NOTE — Progress Notes (Signed)
Remote ICD transmission.   

## 2020-02-13 ENCOUNTER — Telehealth: Payer: Self-pay | Admitting: Cardiology

## 2020-02-13 NOTE — Telephone Encounter (Signed)
  Patient Consent for Virtual Visit         [name] has provided verbal consent on 02/13/2020 for a virtual visit (video or telephone).   CONSENT FOR VIRTUAL VISIT FOR:  [name]  By participating in this virtual visit I agree to the following:  I hereby voluntarily request, consent and authorize [facility] and its employed or contracted physicians, physician assistants, nurse practitioners or other licensed health care professionals (the Practitioner), to provide me with telemedicine health care services (the "Services") as deemed necessary by the treating Practitioner. I acknowledge and consent to receive the Services by the Practitioner via telemedicine. I understand that the telemedicine visit will involve communicating with the Practitioner through live audiovisual communication technology and the disclosure of certain medical information by electronic transmission. I acknowledge that I have been given the opportunity to request an in-person assessment or other available alternative prior to the telemedicine visit and am voluntarily participating in the telemedicine visit.  I understand that I have the right to withhold or withdraw my consent to the use of telemedicine in the course of my care at any time, without affecting my right to future care or treatment, and that the Practitioner or I may terminate the telemedicine visit at any time. I understand that I have the right to inspect all information obtained and/or recorded in the course of the telemedicine visit and may receive copies of available information for a reasonable fee.  I understand that some of the potential risks of receiving the Services via telemedicine include:  Marland Kitchen Delay or interruption in medical evaluation due to technological equipment failure or disruption; . Information transmitted may not be sufficient (e.g. poor resolution of images) to allow for appropriate medical decision making by the Practitioner;  and/or  . In rare instances, security protocols could fail, causing a breach of personal health information.  Furthermore, I acknowledge that it is my responsibility to provide information about my medical history, conditions and care that is complete and accurate to the best of my ability. I acknowledge that Practitioner's advice, recommendations, and/or decision may be based on factors not within their control, such as incomplete or inaccurate data provided by me or distortions of diagnostic images or specimens that may result from electronic transmissions. I understand that the practice of medicine is not an exact science and that Practitioner makes no warranties or guarantees regarding treatment outcomes. I acknowledge that a copy of this consent can be made available to me via my patient portal ([facility]), or I can request a printed copy by calling the office of [facility].    I understand that my insurance will be billed for this visit.   I have read or had this consent read to me. . I understand the contents of this consent, which adequately explains the benefits and risks of the Services being provided via telemedicine.  . I have been provided ample opportunity to ask questions regarding this consent and the Services and have had my questions answered to my satisfaction. . I give my informed consent for the services to be provided through the use of telemedicine in my medical care

## 2020-02-15 ENCOUNTER — Telehealth (INDEPENDENT_AMBULATORY_CARE_PROVIDER_SITE_OTHER): Payer: Medicare HMO | Admitting: Cardiology

## 2020-02-15 ENCOUNTER — Encounter: Payer: Self-pay | Admitting: Cardiology

## 2020-02-15 VITALS — BP 109/66 | HR 80 | Ht 65.0 in | Wt 243.0 lb

## 2020-02-15 DIAGNOSIS — E782 Mixed hyperlipidemia: Secondary | ICD-10-CM | POA: Diagnosis not present

## 2020-02-15 DIAGNOSIS — I2510 Atherosclerotic heart disease of native coronary artery without angina pectoris: Secondary | ICD-10-CM | POA: Diagnosis not present

## 2020-02-15 DIAGNOSIS — I5022 Chronic systolic (congestive) heart failure: Secondary | ICD-10-CM

## 2020-02-15 NOTE — Progress Notes (Signed)
Virtual Visit via Telephone Note   This visit type was conducted due to national recommendations for restrictions regarding the COVID-19 Pandemic (e.g. social distancing) in an effort to limit this patient's exposure and mitigate transmission in our community.  Due to her co-morbid illnesses, this patient is at least at moderate risk for complications without adequate follow up.  This format is felt to be most appropriate for this patient at this time.  The patient did not have access to video technology/had technical difficulties with video requiring transitioning to audio format only (telephone).  All issues noted in this document were discussed and addressed.  No physical exam could be performed with this format.  Please refer to the patient's chart for her  consent to telehealth for [facility].   The patient was identified using 2 identifiers.  Date:  02/15/2020   ID:  [name], DOB 1951-05-01, MRN [id number]  Patient Location: Home Provider Location: Office  PCP:  [name], MD  Cardiologist:  [name], MD  Electrophysiologist:  None   Evaluation Performed:  Follow-Up Visit  Chief Complaint:  Follow up visit  History of Present Illness:    [name] is a 69 y.o. female seen today for follow up of the following meidcal problems.   1. Chronic combined systolicand diastolic heart failure - echo 03/2014 LVEF 25-30%, restrictive diastolic dysfunction. New diagnosis at that time. Repeat echo 06/2014 LVEF 40-45% - cath 03/2014 showed RCA 99% with left to right collaterals, other arteries patent. Overall LV systolic dysfunction out of proportion to CAD. Medically managed.  - 07/2015 echo LVEF [id number], grade I diastolic dysfunction - Entresto causes dizziness and nausea, stopped taking. Back on losartan - due to low bp's we lowered losartan to 25mg  daily. Lowered lasix to 20mg  daily, may take 40mg  as needed.   01/2018 echo LVEF 35%, grade I diastoilc  dysfunction - 11/2018 echo LVEF 30-35% - 05/03/19 had BIV AICD placed by Dr Rayann Heman  11/2019 LVEF 30-35%  - no significant SOB or DOE. No recent edema - compliant with meds - weights down 246-->242 lbs - 07/2019 device check normal  - no recent SOB/DOE - compliant with meds. Weights stable around 243 lbs    2. CAD - cath 03/2014 with 99% chronic RCA disease, otherwise patent vessels. Managed medically  -no chest pain  3. OSA  - on bipap at night. Followed by Dr Radford Pax.   4. Hyperlipidemia -upcoming labs next month  5. Chronic LBBB    SH: has completed covid vaccine x 2.   The patient does not have symptoms concerning for COVID-19 infection (fever, chills, cough, or new shortness of breath).    Past Medical History:  Diagnosis Date  . Anemia   . Arthritis   . CAD (coronary artery disease)    a. LHC (03/2014) Lmain: nl, LAD: diff dz proximal 20%, 30-40% dz mid vessel prior 2nd diagonal, LCx: 40% in OM1, 20-30% in OM2, RCA: 99% subtotal occlusion @ crux, TIMI 1 flow, L-R collaterals to distal vessel  . Chronic systolic heart failure ([place])    a. ECHO (03/2014): EF 25-30%, akinesis enteroanteroseptal myocardium, grade III DD b. RHC (03/2014) RA 4, RV 42/4, PA 44/14 (26), PCWP 21, PA 62% Fick CO/CI 6.9 / 3.4  . Diabetes mellitus   . Duodenal ulcer    Age 82  . Erosive esophagitis   . Gastritis   . Hypertension   . Ischemic cardiomyopathy   . Obese   .  Short-term memory loss   . Thyroid disease   . TTP (thrombotic thrombocytopenic purpura) (HCC)    Past Surgical History:  Procedure Laterality Date  . BIV ICD INSERTION CRT-D N/A 05/03/2019   Procedure: BIV ICD INSERTION CRT-D;  Surgeon: Thompson Grayer, MD;  Location: [facility];  Service: Cardiovascular;  Laterality: N/A;  . COLONOSCOPY  08/29/2012   [id number] diverticulosis  . COLONOSCOPY N/A 05/28/2015   Procedure: COLONOSCOPY;  Surgeon: Daneil Dolin, MD;  Location: [facility];  Service:  Endoscopy;  Laterality: N/A;  1015  . ECTOPIC PREGNANCY SURGERY    . ESOPHAGOGASTRODUODENOSCOPY  04/20/2012   [id number] antral gastritis/Erosive reflux esophagitis  . LEFT AND RIGHT HEART CATHETERIZATION WITH CORONARY ANGIOGRAM N/A 03/28/2014   Procedure: LEFT AND RIGHT HEART CATHETERIZATION WITH CORONARY ANGIOGRAM;  Surgeon: Peter M Martinique, MD;  Location: [facility] C[facility] LAB;  Service: Cardiovascular;  Laterality: N/A;  . SPLENECTOMY, PARTIAL       No outpatient medications have been marked as taking for the 02/15/20 encounter (Appointment) with Arnoldo Lenis, MD.     Allergies:   Other and Lisinopril   Social History   Tobacco Use  . Smoking status: Former Smoker    Packs/day: 0.25    Years: 10.00    Pack years: 2.50    Types: Cigarettes    Start date: 09/20/1966    Quit date: 09/20/1978    Years since quitting: 41.4  . Smokeless tobacco: Never Used  . Tobacco comment: Quit 2008  Substance Use Topics  . Alcohol use: No    Alcohol/week: 0.0 standard drinks  . Drug use: No     Family Hx: The patient's family history includes Cirrhosis in her father; Diabetes in her daughter; Heart failure in her mother. There is no history of Colon cancer.  ROS:   Please see the history of present illness.     All other systems reviewed and are negative.   Prior CV studies:   The following studies were reviewed today:  03/2014 echo Study Conclusions  - Left ventricle: The cavity size was normal. There was mild focal basal hypertrophy of the septum. Systolic function was severely reduced. The estimated ejection fraction was in the range of 25% to 30%. There is akinesis of the entireanteroseptal myocardium. Doppler parameters are consistent with a reversible restrictive pattern, indicative of decreased left ventricular diastolic compliance and/or increased left atrial pressure (grade 3 diastolic dysfunction). - Mitral valve: There was mild regurgitation. - Left atrium: The atrium was  moderately dilated. 03/2014 Cath Procedural Findings:  Hemodynamics  RA 8/0 mean 4 mm Hg  RV 42/4 mm Hg  PA 44/14 mean 26 mm Hg  PCWP 20/28 mean 21 mm Hg  LV 98/18 mm Hg  AO 95/54 mean 72 mm Hg  Oxygen saturations:  PA 62%  AO 90%  Cardiac Output (Fick) 6.9 L/min  Cardiac Index (Fick) 3.4 L/min/meter squared  Coronary angiography:  Coronary dominance: right  Left mainstem: Normal  Left anterior descending (LAD): Diffuse disease in the proximal vessel up to 20%. 30-40% disease in the mid vessel prior to the second diagonal.  Left circumflex (LCx): 40% in OM1. 20-30% in OM2. Otherwise no significant disease.  Right coronary artery (RCA): 99% subtotal occlusion at the crux. TIMI 1 flow. Left to right collaterals to the distal vessel.  Left ventriculography: Left ventricular systolic function is abnormal, LVEF is estimated at 25%. There is inferior akinesis and severe global hypokinesis. There is mild mitral regurgitation  Final Conclusions:  1. Single vessel obstructive CAD with subtotal occlusion of the RCA at the crux.  2. Severe LV dysfunction.  3. Normal Right heart pressures. Elevated PCWP.  Recommendations: Medical management. Her degree of LV dysfunction is out of proportion to her CAD. She has no anginal symptoms so PCI of RCA not indicated. The inferior wall appears scarred.  07/2015 echo Study Conclusions  - Left ventricle: The cavity size was at the upper limits of normal. Wall thickness was increased in a pattern of moderate LVH. The estimated ejection fraction was 40% (similar calculation per biplane speckle tracking, reduced global longitudinal strain of -14.3%). There is akinesis of the basalinferolateral and inferior myocardium. Doppler parameters are consistent with abnormal left ventricular relaxation (grade 1 diastolic dysfunction). - Aortic valve: Mildly calcified annulus. Trileaflet. - Mitral valve: Calcified annulus.  There was trivial regurgitation. - Left atrium: The atrium was at the upper limits of normal in size. - Right atrium: Central venous pressure (est): 3 mm Hg. - Atrial septum: A patent foramen ovale cannot be excluded. - Tricuspid valve: There was trivial regurgitation. - Pulmonary arteries: Systolic pressure could not be accurately estimated. - Pericardium, extracardiac: There was no pericardial effusion.  Impressions:  - Upper normal LV chamber size with moderate LVH and LVEF approximately 40% as discussed above. There is akinesis of the basal inferoseptal and inferior myocardium. Grade 1 diastolic dysfunction. Compared to the previous study from October 2015, LVEF is in similar range, perhaps slightly reduced. Upper normal left atrial chamber size. Trivial tricuspid regurgitation. Cannot exclude PFO based on limited images.   01/2018 echo Study Conclusions  - Left ventricle: The cavity size was normal. Wall thickness was increased in a pattern of mild LVH. The estimated ejection fraction was 35%. There is akinesis of the basal-midinferior myocardium. There is akinesis of the mid-apicalanteroseptal and apical myocardium. Doppler parameters are consistent with abnormal left ventricular relaxation (grade 1 diastolic dysfunction). - Ventricular septum: Septal motion showed abnormal function and dyssynergy suggestive of left bundle [name] block. - Aortic valve: Moderately calcified annulus. Trileaflet. - Mitral valve: There was mild regurgitation. - Right atrium: Central venous pressure (est): 3 mm Hg. - Atrial septum: No defect or patent foramen ovale was identified. - Tricuspid valve: There was trivial regurgitation. - Pulmonary arteries: Systolic pressure could not be accurately estimated. - Pericardium, extracardiac: There was no pericardial effusion   11/2018 echo 1. The left ventricle has moderate-severely reduced systolic function,  with an ejection fraction of 30-35%. The cavity size was normal. There is mildly increased left ventricular wall thickness. Left ventricular diastolic Doppler parameters are  consistent with impaired relaxation. Elevated mean left atrial pressure. 2. The anteroseptal, anterolateral, apical walls are hypokinetic. 3. The right ventricle has normal systolic function. The cavity was normal. There is no increase in right ventricular wall thickness. 4. Left atrial size was moderately dilated. 5. No evidence of mitral valve stenosis. 6. The aortic valve is tricuspid no stenosis of the aortic valve. 7. Pulmonary hypertension is indeterminant, inadequate TR jet. 8. The inferior vena cava was dilated in size with <50% respiratory variability. 9. The interatrial septum was not well visualized.  Labs/Other Tests and Data Reviewed:    EKG:  No ECG reviewed.  Recent Labs: 04/30/2019: BUN 19; Creatinine, Ser 0.96; Hemoglobin 11.2; Platelets 170; Potassium 4.1; Sodium 137   Recent Lipid Panel No results found for: CHOL, TRIG, HDL, CHOLHDL, LDLCALC, LDLDIRECT  Wt Readings from Last 3 Encounters:  10/16/19 242 lb (109.8 kg)  08/03/19 244 lb (110.7 kg)  06/27/19 244 lb (110.7 kg)     Objective:    Vital Signs:   Today's Vitals   02/15/20 1350  BP: 109/66  Pulse: 80  Weight: 243 lb (110.2 kg)  Height: 5\' 5"  (1.651 m)   Body mass index is 40.44 kg/m.  Normal affect. Normal speech pattern and tone. No audible sings of sob or wheezing. Comfortable, no apparent distress  ASSESSMENT & PLAN:    1. Chronic combined systolic/diastolic HF - medical therapy limited by soft bp's. Did not tolerate entresto. She is on her maximally tolerated regimen -s/p BIV AICD  -no symptoms, continue current meds  2. CAD - medically managed single vessel RCA disease - no symptoms, continue current meds  3.OSA -continue to f/u with Dr Radford Pax  4. Hyperlipidemia -she will continue  staitn    COVID-19 Education: The signs and symptoms of COVID-19 were discussed with the patient and how to seek care for testing (follow up with PCP or arrange E-visit).  The importance of social distancing was discussed today.  Time:   Today, I have spent 18 minutes with the patient with telehealth technology discussing the above problems.     Medication Adjustments/Labs and Tests Ordered: Current medicines are reviewed at length with the patient today.  Concerns regarding medicines are outlined above.   Tests Ordered: No orders of the defined types were placed in this encounter.   Medication Changes: No orders of the defined types were placed in this encounter.   Follow Up:  In Person in 4 month(s)  Signed, [name], MD  02/15/2020 1:01 PM    [place]

## 2020-02-15 NOTE — Patient Instructions (Signed)
Your physician recommends that you schedule a follow-up appointment in: 4 MONTHS WITH DR BRANCH  Your physician recommends that you continue on your current medications as directed. Please refer to the Current Medication list given to you today.  Thank you for choosing [facility]!!    

## 2020-02-20 ENCOUNTER — Ambulatory Visit: Payer: Medicare HMO | Admitting: Internal Medicine

## 2020-02-22 DIAGNOSIS — G4733 Obstructive sleep apnea (adult) (pediatric): Secondary | ICD-10-CM | POA: Diagnosis not present

## 2020-02-25 ENCOUNTER — Ambulatory Visit (INDEPENDENT_AMBULATORY_CARE_PROVIDER_SITE_OTHER): Payer: Medicare HMO

## 2020-02-25 DIAGNOSIS — Z95810 Presence of automatic (implantable) cardiac defibrillator: Secondary | ICD-10-CM | POA: Diagnosis not present

## 2020-02-25 DIAGNOSIS — I5022 Chronic systolic (congestive) heart failure: Secondary | ICD-10-CM

## 2020-02-27 NOTE — Progress Notes (Signed)
EPIC Encounter for ICM Monitoring  Patient Name: [name] is a 69 y.o. female Date: 02/27/2020 Primary Care Physican: Mckinley Jewel, MD Primary Cardiologist:Branch Electrophysiologist:Allred Bi-V Pacing:99% 6/9/2021Weight:242lbs   Spoke with patient and reports feeling well at this time.  Denies fluid symptoms.    CorvueThoracic impedancenormal.   Prescribed: Furosemide20 mg take 1 tablet by mouth dailyand per Dr Nelly Laurence 10/08/19 OV note, she may take 40 mg as needed.  Labs: 08/10/2020Creatinine 0.96, BUN 19, Potassium 4.1, Sodium 137, GFR 60->60  Recommendations:No changes and encouraged to call if experiencing any fluid symptoms.    Follow-up plan: ICM clinic phone appointment on 03/31/2020. 91 day device clinic remote transmission8/23/2021.   Copy of ICM check sent to Dr.Allred.  3 month ICM trend: 02/25/2020    1 Year ICM trend:       [name], RN 02/27/2020 11:24 AM

## 2020-03-07 ENCOUNTER — Ambulatory Visit (INDEPENDENT_AMBULATORY_CARE_PROVIDER_SITE_OTHER): Payer: Medicare HMO | Admitting: Internal Medicine

## 2020-03-07 ENCOUNTER — Encounter: Payer: Self-pay | Admitting: Internal Medicine

## 2020-03-07 ENCOUNTER — Other Ambulatory Visit: Payer: Self-pay

## 2020-03-07 VITALS — HR 88 | Temp 97.7°F | Resp 15 | Ht 65.0 in | Wt 243.0 lb

## 2020-03-07 DIAGNOSIS — Z1159 Encounter for screening for other viral diseases: Secondary | ICD-10-CM | POA: Diagnosis not present

## 2020-03-07 DIAGNOSIS — Z1231 Encounter for screening mammogram for malignant neoplasm of breast: Secondary | ICD-10-CM

## 2020-03-07 DIAGNOSIS — Z7689 Persons encountering health services in other specified circumstances: Secondary | ICD-10-CM

## 2020-03-07 DIAGNOSIS — E039 Hypothyroidism, unspecified: Secondary | ICD-10-CM

## 2020-03-07 DIAGNOSIS — G4733 Obstructive sleep apnea (adult) (pediatric): Secondary | ICD-10-CM

## 2020-03-07 DIAGNOSIS — E119 Type 2 diabetes mellitus without complications: Secondary | ICD-10-CM

## 2020-03-07 DIAGNOSIS — I5042 Chronic combined systolic (congestive) and diastolic (congestive) heart failure: Secondary | ICD-10-CM

## 2020-03-07 DIAGNOSIS — B351 Tinea unguium: Secondary | ICD-10-CM | POA: Diagnosis not present

## 2020-03-07 DIAGNOSIS — Z794 Long term (current) use of insulin: Secondary | ICD-10-CM

## 2020-03-07 DIAGNOSIS — I10 Essential (primary) hypertension: Secondary | ICD-10-CM | POA: Diagnosis not present

## 2020-03-07 MED ORDER — INSULIN GLARGINE 100 UNIT/ML ~~LOC~~ SOLN: 40.0000 [IU] | Freq: Two times a day (BID) | SUBCUTANEOUS | 1 refills | Status: DC

## 2020-03-07 MED ORDER — INSULIN ASPART 100 UNIT/ML ~~LOC~~ SOLN: 10.0000 [IU] | Freq: Three times a day (TID) | SUBCUTANEOUS | 1 refills | Status: DC

## 2020-03-07 MED ORDER — LEVOTHYROXINE SODIUM 25 MCG PO TABS: 25.0000 ug | ORAL_TABLET | Freq: Every day | ORAL | 1 refills | Status: DC

## 2020-03-07 MED ORDER — CICLOPIROX 0.77 % EX GEL: CUTANEOUS | 0 refills | Status: DC

## 2020-03-07 NOTE — Progress Notes (Signed)
New Patient Office Visit  Subjective:  Patient ID: TRENTON VERNE, female    DOB: [birth year]  Age: 69 y.o. MRN: [id number]  CC:  Chief Complaint  Patient presents with  . New Patient (Initial Visit)    establish care    HPI Zavannah B Neiss is a very pleasant 69 year old female with past medical history of chronic combined systolic and diastolic heart failure with ejection fraction of 30 to 35%, ischemic cardiomyopathy status post ICD hypertension, hyperlipidemia, diabetes mellitus, obstructive sleep apnea-on BiPAP at night, obesity with BMI of 40, coronary artery disease presents in our clinic for the first time for establishment of care.  Patient tells me that she is doing fine, denies any complaints today.  She is compliant with her home medication.  Denies headache, blurry vision, chest pain, shortness of breath, leg swelling, orthopnea, PND, nausea, vomiting, fever, chills, cough, congestion, urinary or bowel changes.  She requested for refills on her insulin and levothyroxine.  She takes Lantus 40 units twice daily and NovoLog 10 units 3 times daily at home.  Reports being compliant with her insulin regimen.  Denies ulcers or sores in feet or numbness tingling sensation in hands or feet.  Her last eye exam was last year.  She denies change in vision.  She reports thickening of toenails.  No history of smoking, alcohol, recent drug use.  She is followed by cardiology outpatient.  Her blood pressure was noted to be on lower side.  Her losartan dose was decreased from 50 to 25 mg and Lasix from 40 to 20 mg on 5/21 by cardiology.  She is compliant with BiPAP at nighttime.  She is up-to-date on vaccinations including COVID-19 x2.  She is due for hepatitis C screening mammogram, DEXA scan, ophthalmology eye exam, PCV 13 vaccine and Tdap.  Past Medical History:  Diagnosis Date  . Anemia   . Arthritis   . CAD (coronary artery disease)    a. LHC (03/2014) L[place]: nl, LAD: diff dz proximal  20%, 30-40% dz mid vessel prior 2nd diagonal, LCx: 40% in OM1, 20-30% in OM2, RCA: 99% subtotal occlusion @ crux, TIMI 1 flow, L-R collaterals to distal vessel  . Chronic systolic heart failure ([facility])    a. ECHO (03/2014): EF 25-30%, akinesis enteroanteroseptal myocardium, grade III DD b. RHC (03/2014) RA 4, RV 42/4, PA 44/14 (26), PCWP 21, PA 62% Fick CO/CI 6.9 / 3.4  . Diabetes mellitus   . Duodenal ulcer    Age 54  . Erosive esophagitis   . Gastritis   . Hypertension   . Ischemic cardiomyopathy   . Obese   . Short-term memory loss   . Thyroid disease   . TTP (thrombotic thrombocytopenic purpura) (HCC)     Past Surgical History:  Procedure Laterality Date  . BIV ICD INSERTION CRT-D N/A 05/03/2019   Procedure: BIV ICD INSERTION CRT-D;  Surgeon: Thompson Grayer, MD;  Location: [facility];  Service: Cardiovascular;  Laterality: N/A;  . COLONOSCOPY  08/29/2012   GYB:WLSLHTD diverticulosis  . COLONOSCOPY N/A 05/28/2015   Procedure: COLONOSCOPY;  Surgeon: Daneil Dolin, MD;  Location: [facility];  Service: Endoscopy;  Laterality: N/A;  1015  . ECTOPIC PREGNANCY SURGERY    . ESOPHAGOGASTRODUODENOSCOPY  04/20/2012   SKA:JGOTLXB antral gastritis/Erosive reflux esophagitis  . LEFT AND RIGHT HEART CATHETERIZATION WITH CORONARY ANGIOGRAM N/A 03/28/2014   Procedure: LEFT AND RIGHT HEART CATHETERIZATION WITH CORONARY ANGIOGRAM;  Surgeon: Peter M Martinique, MD;  Location: [facility]  CATH LAB;  Service: Cardiovascular;  Laterality: N/A;  . SPLENECTOMY, PARTIAL      Family History  Problem Relation Age of Onset  . Heart failure Mother   . Cirrhosis Father        ETOH  . Diabetes Daughter   . Colon cancer Neg Hx     Social History   Socioeconomic History  . Marital status: Divorced    Spouse name: Not on file  . Number of children: 3  . Years of education: Not on file  . Highest education level: Not on file  Occupational History  . Occupation: disabled    Fish farm manager: NOT EMPLOYED  Tobacco Use    . Smoking status: Former Smoker    Packs/day: 0.25    Years: 10.00    Pack years: 2.50    Types: Cigarettes    Start date: 09/20/1966    Quit date: 09/20/1978    Years since quitting: 41.4  . Smokeless tobacco: Never Used  . Tobacco comment: Quit 2008  Vaping Use  . Vaping Use: Never used  Substance and Sexual Activity  . Alcohol use: No    Alcohol/week: 0.0 standard drinks  . Drug use: No  . Sexual activity: Not on file  Other Topics Concern  . Not on file  Social History Narrative   Lives w/ daughter, [name]         Social Determinants of Health   Financial Resource Strain:   . Difficulty of Paying Living Expenses:   Food Insecurity:   . Worried About Charity fundraiser in the Last Year:   . Arboriculturist in the Last Year:   Transportation Needs:   . Film/video editor (Medical):   Marland Kitchen Lack of Transportation (Non-Medical):   Physical Activity:   . Days of Exercise per Week:   . Minutes of Exercise per Session:   Stress:   . Feeling of Stress :   Social Connections:   . Frequency of Communication with Friends and Family:   . Frequency of Social Gatherings with Friends and Family:   . Attends Religious Services:   . Active Member of Clubs or Organizations:   . Attends Archivist Meetings:   Marland Kitchen Marital Status:   Intimate Partner Violence:   . Fear of Current or Ex-Partner:   . Emotionally Abused:   Marland Kitchen Physically Abused:   . Sexually Abused:     ROS Review of Systems  Constitutional: Negative.   HENT: Negative.   Eyes: Negative.   Respiratory: Negative.   Cardiovascular: Negative.   Gastrointestinal: Negative.   Endocrine: Negative.   Genitourinary: Negative.   Musculoskeletal: Negative.   Allergic/Immunologic: Negative.   Neurological: Negative.   Hematological: Negative.   Psychiatric/Behavioral: Negative.     Objective:   Today's Vitals: Pulse 88   Temp 97.7 F (36.5 C) (Temporal)   Resp 15   Ht 5\' 5"  (1.651 m)   Wt 243 lb  0.6 oz (110.2 kg)   SpO2 97%   BMI 40.44 kg/m   Physical Exam Constitutional:      Appearance: Normal appearance. She is obese.  HENT:     Head: Normocephalic and atraumatic.     Nose: Nose normal.     Mouth/Throat:     Mouth: Mucous membranes are moist.  Eyes:     Extraocular Movements: Extraocular movements intact.     Conjunctiva/sclera: Conjunctivae normal.     Pupils: Pupils are equal, round, and reactive to  light.  Cardiovascular:     Rate and Rhythm: Normal rate.     Pulses: Normal pulses.     Heart sounds: Normal heart sounds.  Pulmonary:     Effort: Pulmonary effort is normal.     Breath sounds: Normal breath sounds.  Abdominal:     General: Bowel sounds are normal.     Palpations: Abdomen is soft.  Musculoskeletal:        General: Normal range of motion.     Cervical back: Normal range of motion and neck supple.  Neurological:     General: No focal deficit present.     Mental Status: She is alert and oriented to person, place, and time.  Psychiatric:        Mood and Affect: Mood normal.        Behavior: Behavior normal.        Thought Content: Thought content normal.        Judgment: Judgment normal.     Assessment & Plan:   Problem List Items Addressed This Visit      Cardiovascular and Mediastinum   HTN (hypertension)   Relevant Orders   CBC   COMPLETE METABOLIC PANEL WITH GFR   TSH   Chronic combined systolic and diastolic CHF (congestive heart failure) (HCC)   Relevant Orders   CBC   COMPLETE METABOLIC PANEL WITH GFR    Other Visit Diagnoses    Encounter to establish care    -  Primary   Type 2 diabetes mellitus without complication, with long-term current use of insulin (HCC)       Relevant Medications   insulin glargine (LANTUS) 100 UNIT/ML injection   insulin aspart (NOVOLOG) 100 UNIT/ML injection   Other Relevant Orders   Hemoglobin A1c   Lipid panel   Microalbumin, urine   Morbid obesity (HCC)       Relevant Medications   insulin  glargine (LANTUS) 100 UNIT/ML injection   insulin aspart (NOVOLOG) 100 UNIT/ML injection   Other Relevant Orders   Hemoglobin A1c   Lipid panel   TSH   Acquired hypothyroidism       Relevant Medications   levothyroxine (SYNTHROID) 25 MCG tablet   Other Relevant Orders   TSH   Screening mammogram, encounter for       Onychomycosis of left great toe       Relevant Medications   Ciclopirox 0.77 % gel   Need for hepatitis C screening test       Relevant Orders   Hepatitis C antibody   OSA (obstructive sleep apnea)         Encounter to establish care: Care established. -Check CBC, CMP, lipid panel, A1c, TSH, urine microalbumin urea, hepatitis C screening -Follow-up in 3 months -PHQ-9 score: 1. no history of falls  Chronic combined systolic and diastolic congestive heart failure with ejection fraction of 30 to 35%/ischemic cardiomyopathy status post AICD: Stable -Followed by cardiology outpatient -Continue same meds losartan, aspirin, statin, Lasix 20 mg, Aldactone.  Hypertension: Blood pressure noted to be on lower side. -She is asymptomatic.  Her losartan and Lasix dose was recently decreased. -Advised patient to check blood pressure every day and hold meds if blood pressure is lower than 100/60 she verbalized understanding.  Hyperlipidemia: Check lipid panel -Continue statin  Hypothyroidism: Check TSH -Continue levothyroxine.  Refills given  Type 2 diabetes mellitus: Stable -Continue NovoLog 10 units 3 times daily, Lantus 40 units twice daily, check A1c. -Refills given.  Advise carb  consistent diet, exercise and weight loss. -Foot exam done today.  Encouraged to get ophthalmology appointment for routine eye checkup.  Onychomycosis of left great toe: -Prescribed ciclopirox once daily  Morbid obesity with BMI of 40 -Discussed dietary modification, exercise and weight loss  Encounter for mammogram screening: Patient referred for mammogram  OSA: Stable on BiPAP at  nighttime.  -Follow-up in 3 months.  We will talk about Tdap/Prevnar 13, DEXA scan. Outpatient Encounter Medications as of 03/07/2020  Medication Sig  . acetaminophen (TYLENOL) 500 MG tablet Take 1,000 mg by mouth every 6 (six) hours as needed for moderate pain.   Marland Kitchen aspirin 81 MG chewable tablet Chew 1 tablet (81 mg total) by mouth daily.  Marland Kitchen atorvastatin (LIPITOR) 80 MG tablet Take 1 tablet (80 mg total) by mouth daily.  . carvedilol (COREG) 25 MG tablet TAKE 1 TABLET TWICE DAILY  . furosemide (LASIX) 20 MG tablet Take 1 tablet (20 mg total) by mouth daily.  . insulin aspart (NOVOLOG) 100 UNIT/ML injection Inject 10 Units into the skin 3 (three) times daily with meals.  . insulin glargine (LANTUS) 100 UNIT/ML injection Inject 0.4 mLs (40 Units total) into the skin 2 (two) times daily.  Marland Kitchen levothyroxine (SYNTHROID) 25 MCG tablet Take 1 tablet (25 mcg total) by mouth daily before breakfast.  . losartan (COZAAR) 25 MG tablet TAKE 1 TABLET EVERY DAY  . Multiple Vitamin (MULTIVITAMIN) tablet Take 1 tablet by mouth daily.  Marland Kitchen omega-3 fish oil (MAXEPA) 1000 MG CAPS capsule Take 1 capsule by mouth daily.  . polyethylene glycol (MIRALAX / GLYCOLAX) packet Take 17 g by mouth daily as needed for mild constipation.   Marland Kitchen spironolactone (ALDACTONE) 25 MG tablet TAKE 1 TABLET EVERY DAY  . [DISCONTINUED] insulin glargine (LANTUS) 100 UNIT/ML injection Inject 40 Units into the skin 2 (two) times daily.  . [DISCONTINUED] levothyroxine (SYNTHROID, LEVOTHROID) 25 MCG tablet Take 25 mcg by mouth daily before breakfast.  . [DISCONTINUED] NOVOLOG 100 UNIT/ML injection Inject 10 Units into the skin 3 (three) times daily with meals.   . Ciclopirox 0.77 % gel Once daily on effected toe nail.   No facility-administered encounter medications on file as of 03/07/2020.    Follow-up: Return in about 3 months (around 06/07/2020).   Mckinley Jewel, MD

## 2020-03-07 NOTE — Patient Instructions (Addendum)
Check CBC, CMP, A1c, lipid panel, TSH, Urine microalbuminuria & hepatitis c screening today Refer for mammogram encourged to get eye exam done Follow up in 3 months

## 2020-03-14 ENCOUNTER — Ambulatory Visit
Admission: RE | Admit: 2020-03-14 | Discharge: 2020-03-14 | Disposition: A | Discharge status: Home / Self Care | Payer: Medicare HMO | Source: Ambulatory Visit | Attending: Internal Medicine | Admitting: Internal Medicine

## 2020-03-14 ENCOUNTER — Other Ambulatory Visit: Payer: Self-pay

## 2020-03-14 DIAGNOSIS — Z794 Long term (current) use of insulin: Secondary | ICD-10-CM | POA: Diagnosis not present

## 2020-03-14 DIAGNOSIS — E039 Hypothyroidism, unspecified: Secondary | ICD-10-CM | POA: Diagnosis not present

## 2020-03-14 DIAGNOSIS — Z1231 Encounter for screening mammogram for malignant neoplasm of breast: Secondary | ICD-10-CM | POA: Diagnosis not present

## 2020-03-14 DIAGNOSIS — Z1159 Encounter for screening for other viral diseases: Secondary | ICD-10-CM | POA: Diagnosis not present

## 2020-03-14 DIAGNOSIS — I10 Essential (primary) hypertension: Secondary | ICD-10-CM | POA: Diagnosis not present

## 2020-03-14 DIAGNOSIS — E119 Type 2 diabetes mellitus without complications: Secondary | ICD-10-CM | POA: Diagnosis not present

## 2020-03-14 DIAGNOSIS — I5042 Chronic combined systolic (congestive) and diastolic (congestive) heart failure: Secondary | ICD-10-CM | POA: Diagnosis not present

## 2020-03-17 LAB — COMPLETE METABOLIC PANEL WITH GFR
AG Ratio: 1.1 (calc) (ref 1.0–2.5)
ALT: 30 U/L — ABNORMAL HIGH (ref 6–29)
AST: 20 U/L (ref 10–35)
Albumin: 4.1 g/dL (ref 3.6–5.1)
Alkaline phosphatase (APISO): 78 U/L (ref 37–153)
BUN/Creatinine Ratio: 25 (calc) — ABNORMAL HIGH (ref 6–22)
BUN: 33 mg/dL — ABNORMAL HIGH (ref 7–25)
CO2: 26 mmol/L (ref 20–32)
Calcium: 9.9 mg/dL (ref 8.6–10.4)
Chloride: 99 mmol/L (ref 98–110)
Creat: 1.32 mg/dL — ABNORMAL HIGH (ref 0.60–0.93)
GFR, Est African American: 47 mL/min/{1.73_m2} — ABNORMAL LOW (ref >=60)
GFR, Est Non African American: 40 mL/min/{1.73_m2} — ABNORMAL LOW (ref >=60)
Globulin: 3.8 g/dL (calc) — ABNORMAL HIGH (ref 1.9–3.7)
Glucose, Bld: 162 mg/dL — ABNORMAL HIGH (ref 65–99)
Potassium: 5.5 mmol/L — ABNORMAL HIGH (ref 3.5–5.3)
Sodium: 135 mmol/L (ref 135–146)
Total Bilirubin: 0.6 mg/dL (ref 0.2–1.2)
Total Protein: 7.9 g/dL (ref 6.1–8.1)

## 2020-03-17 LAB — HEMOGLOBIN A1C
Hgb A1c MFr Bld: 9.3 % of total Hgb — ABNORMAL HIGH (ref <5.7)
Mean Plasma Glucose: 220 (calc)
eAG (mmol/L): 12.2 (calc)

## 2020-03-17 LAB — CBC
HCT: 34.8 % — ABNORMAL LOW (ref 35.0–45.0)
Hemoglobin: 11.3 g/dL — ABNORMAL LOW (ref 11.7–15.5)
MCH: 28 pg (ref 27.0–33.0)
MCHC: 32.5 g/dL (ref 32.0–36.0)
MCV: 86.4 fL (ref 80.0–100.0)
Platelets: 199 10*3/uL (ref 140–400)
RBC: 4.03 10*6/uL (ref 3.80–5.10)
RDW: 15.8 % — ABNORMAL HIGH (ref 11.0–15.0)
WBC: 5.6 10*3/uL (ref 3.8–10.8)

## 2020-03-17 LAB — MICROALBUMIN, URINE: Microalb, Ur: <0.2 mg/dL

## 2020-03-17 LAB — HEPATITIS C ANTIBODY
Hepatitis C Ab: NONREACTIVE
SIGNAL TO CUT-OFF: 0.13 (ref <1.00)

## 2020-03-17 LAB — LIPID PANEL
Cholesterol: 76 mg/dL (ref <200)
HDL: 33 mg/dL — ABNORMAL LOW (ref >=50)
LDL Cholesterol (Calc): 27 mg/dL (calc)
Non-HDL Cholesterol (Calc): 43 mg/dL (calc) (ref <130)
Total CHOL/HDL Ratio: 2.3 (calc) (ref <5.0)
Triglycerides: 75 mg/dL (ref <150)

## 2020-03-17 LAB — TSH: TSH: 5.62 mIU/L — ABNORMAL HIGH (ref 0.40–4.50)

## 2020-03-20 ENCOUNTER — Other Ambulatory Visit (HOSPITAL_COMMUNITY): Payer: Self-pay | Admitting: Internal Medicine

## 2020-03-21 ENCOUNTER — Other Ambulatory Visit: Payer: Self-pay | Admitting: Internal Medicine

## 2020-03-21 DIAGNOSIS — R928 Other abnormal and inconclusive findings on diagnostic imaging of breast: Secondary | ICD-10-CM

## 2020-03-23 DIAGNOSIS — G4733 Obstructive sleep apnea (adult) (pediatric): Secondary | ICD-10-CM | POA: Diagnosis not present

## 2020-03-28 ENCOUNTER — Other Ambulatory Visit: Payer: Self-pay | Admitting: Internal Medicine

## 2020-03-31 ENCOUNTER — Ambulatory Visit (INDEPENDENT_AMBULATORY_CARE_PROVIDER_SITE_OTHER): Payer: Medicare HMO

## 2020-03-31 ENCOUNTER — Other Ambulatory Visit: Payer: Self-pay | Admitting: Internal Medicine

## 2020-03-31 DIAGNOSIS — Z95810 Presence of automatic (implantable) cardiac defibrillator: Secondary | ICD-10-CM

## 2020-03-31 DIAGNOSIS — I5022 Chronic systolic (congestive) heart failure: Secondary | ICD-10-CM | POA: Diagnosis not present

## 2020-04-02 NOTE — Progress Notes (Signed)
EPIC Encounter for ICM Monitoring  Patient Name: [name] is a 69 y.o. female Date: 04/02/2020 Primary Care Physican: Mckinley Jewel, MD Primary Cardiologist:Branch Electrophysiologist:Allred Bi-V Pacing:99% 6/9/2021Weight:242lbs   Spoke with patient.  Denies fluid symptoms.  She reports not feeling well past weekend due to allergies and increased fluid intake.  She is feeling fine now.  CorvueThoracic impedancenormal.   Prescribed: Furosemide20 mg take 1 tablet by mouth dailyand per Dr Nelly Laurence 10/08/19 OV note, she may take 40 mg as needed.  Labs: 08/10/2020Creatinine 0.96, BUN 19, Potassium 4.1, Sodium 137, GFR 60->60  Recommendations:No changes and encouraged to call if experiencing any fluid symptoms.  Follow-up plan: ICM clinic phone appointment on8/16/2021. 91 day device clinic remote transmission8/23/2021.    EP/Cardiology Office Visits: 06/19/2020 with Dr. Harl Bowie.    Copy of ICM check sent to Dr. Rayann Heman.   3 month ICM trend: 03/31/2020    1 Year ICM trend:       [name], RN 04/02/2020 2:45 PM

## 2020-04-08 ENCOUNTER — Ambulatory Visit
Admission: RE | Admit: 2020-04-08 | Discharge: 2020-04-08 | Disposition: A | Discharge status: Home / Self Care | Payer: Medicare HMO | Source: Ambulatory Visit | Attending: Internal Medicine | Admitting: Internal Medicine

## 2020-04-08 ENCOUNTER — Other Ambulatory Visit: Payer: Self-pay

## 2020-04-08 DIAGNOSIS — N6489 Other specified disorders of breast: Secondary | ICD-10-CM | POA: Diagnosis not present

## 2020-04-08 DIAGNOSIS — R928 Other abnormal and inconclusive findings on diagnostic imaging of breast: Secondary | ICD-10-CM

## 2020-04-09 DIAGNOSIS — G4733 Obstructive sleep apnea (adult) (pediatric): Secondary | ICD-10-CM | POA: Diagnosis not present

## 2020-04-14 ENCOUNTER — Other Ambulatory Visit: Payer: Self-pay | Admitting: Internal Medicine

## 2020-04-23 DIAGNOSIS — G4733 Obstructive sleep apnea (adult) (pediatric): Secondary | ICD-10-CM | POA: Diagnosis not present

## 2020-05-01 ENCOUNTER — Encounter: Payer: Self-pay | Admitting: Internal Medicine

## 2020-05-01 ENCOUNTER — Other Ambulatory Visit: Payer: Self-pay

## 2020-05-01 ENCOUNTER — Ambulatory Visit (INDEPENDENT_AMBULATORY_CARE_PROVIDER_SITE_OTHER): Payer: Medicare HMO | Admitting: Internal Medicine

## 2020-05-01 VITALS — BP 106/65 | HR 88 | Temp 98.4°F | Ht 65.0 in | Wt 244.1 lb

## 2020-05-01 DIAGNOSIS — E669 Obesity, unspecified: Secondary | ICD-10-CM | POA: Insufficient documentation

## 2020-05-01 DIAGNOSIS — Z794 Long term (current) use of insulin: Secondary | ICD-10-CM

## 2020-05-01 DIAGNOSIS — G4733 Obstructive sleep apnea (adult) (pediatric): Secondary | ICD-10-CM | POA: Diagnosis not present

## 2020-05-01 DIAGNOSIS — I5042 Chronic combined systolic (congestive) and diastolic (congestive) heart failure: Secondary | ICD-10-CM

## 2020-05-01 DIAGNOSIS — E1165 Type 2 diabetes mellitus with hyperglycemia: Secondary | ICD-10-CM | POA: Diagnosis not present

## 2020-05-01 DIAGNOSIS — E039 Hypothyroidism, unspecified: Secondary | ICD-10-CM | POA: Diagnosis not present

## 2020-05-01 NOTE — Patient Instructions (Signed)
Follow up in 3 months Repeat TSH, CMP, A1c on next visit Watch carbs, exercise, weight loss

## 2020-05-01 NOTE — Progress Notes (Signed)
Established Patient Office Visit  Subjective:  Patient ID: [name], female    DOB: [birth year]  Age: 69 y.o. MRN: [id number]  CC: No chief complaint on file.   HPI [name] is a very pleasant 69 year old female with past medical history of chronic combined systolic and diastolic CHF with ejection fraction of 30 to 35%, ischemic cardiomyopathy status post ICD placement, hypertension, hyperlipidemia, type 2 diabetes mellitus, obstructive sleep apnea on BiPAP at night, obesity with BMI of 40, coronary artery disease, GERD, hypothyroidism presents in our clinic for follow-up on her chronic medical issues.  On previous clinic visit: Her TSH was noted to be elevated therefore her levothyroxine dose was increased from 25 mcg to 50 mcg.  Her A1c was noted to be 9.3% which trended up from 8.4%.  Her Lantus was increased from 40 units to 48 units at bedtime in NovoLog was increased from 10 units to 12 units 3 times daily.  She tells me that her blood sugar looks better at home.  She is compliant with her home medications.  She was prescribed ciclopirox for onychomycosis of toenails.  She tells me that she has been applying the cream and has noticed a little improvement in her toes.  She denies shortness of breath, leg swelling, orthopnea, PND, chest pain, palpitation or weight gain.  She tells me that she has an appointment with her cardiologist next month.  Her screening mammogram initially showed a right breast mass however diagnostic mammogram and ultrasound breast came back negative for acute findings.  She is aware of the mammogram and ultrasound results.  She is due for DEXA scan tells me that she will think about DEXA scan and will let me know on next visit.  Past Medical History:  Diagnosis Date  . Anemia   . Arthritis   . CAD (coronary artery disease)    a. LHC (03/2014) Lmain: nl, LAD: diff dz proximal 20%, 30-40% dz mid vessel prior 2nd diagonal, LCx: 40% in OM1, 20-30% in  OM2, RCA: 99% subtotal occlusion @ crux, TIMI 1 flow, L-R collaterals to distal vessel  . Chronic systolic heart failure ([place])    a. ECHO (03/2014): EF 25-30%, akinesis enteroanteroseptal myocardium, grade III DD b. RHC (03/2014) RA 4, RV 42/4, PA 44/14 (26), PCWP 21, PA 62% Fick CO/CI 6.9 / 3.4  . Diabetes mellitus   . Duodenal ulcer    Age 42  . Erosive esophagitis   . Gastritis   . Hypertension   . Ischemic cardiomyopathy   . Obese   . Short-term memory loss   . Thyroid disease   . TTP (thrombotic thrombocytopenic purpura) (HCC)     Past Surgical History:  Procedure Laterality Date  . BIV ICD INSERTION CRT-D N/A 05/03/2019   Procedure: BIV ICD INSERTION CRT-D;  Surgeon: Christina Grayer, MD;  Location: [place] [facility];  Service: Cardiovascular;  Laterality: N/A;  . COLONOSCOPY  08/29/2012   BSJ:[id number] diverticulosis  . COLONOSCOPY N/A 05/28/2015   Procedure: COLONOSCOPY;  Surgeon: Christina Dolin, MD;  [facility];  Service: Endoscopy;  Laterality: N/A;  1015  . ECTOPIC PREGNANCY SURGERY    . ESOPHAGOGASTRODUODENOSCOPY  04/20/2012   HUT:[id number] antral gastritis/Erosive reflux esophagitis  . LEFT AND RIGHT HEART CATHETERIZATION WITH CORONARY ANGIOGRAM N/A 03/28/2014   Procedure: LEFT AND RIGHT HEART CATHETERIZATION WITH CORONARY ANGIOGRAM;  Surgeon: Peter M Martinique, MD;  Location: [facility] CATH LAB;  Service: Cardiovascular;  Laterality: N/A;  . SPLENECTOMY, PARTIAL  Family History  Problem Relation Age of Onset  . Heart failure Mother   . Cirrhosis Father        ETOH  . Diabetes Daughter   . Breast cancer Maternal Aunt   . Colon cancer Neg Hx     Social History   Socioeconomic History  . Marital status: Divorced    Spouse name: Not on file  . Number of children: 3  . Years of education: Not on file  . Highest education level: Not on file  Occupational History  . Occupation: disabled    Fish farm manager: NOT EMPLOYED  Tobacco Use  . Smoking status: Former Smoker     Packs/day: 0.25    Years: 10.00    Pack years: 2.50    Types: Cigarettes    Start date: 09/20/1966    Quit date: 09/20/1978    Years since quitting: 41.6  . Smokeless tobacco: Never Used  . Tobacco comment: Quit 2008  Vaping Use  . Vaping Use: Never used  Substance and Sexual Activity  . Alcohol use: No    Alcohol/week: 0.0 standard drinks  . Drug use: No  . Sexual activity: Not on file  Other Topics Concern  . Not on file  Social History Narrative   Lives w/ daughter, [name]         Social Determinants of Health   Financial Resource Strain:   . Difficulty of Paying Living Expenses:   Food Insecurity:   . Worried About Charity fundraiser in the Last Year:   . Arboriculturist in the Last Year:   Transportation Needs:   . Film/video editor (Medical):   Marland Kitchen Lack of Transportation (Non-Medical):   Physical Activity:   . Days of Exercise per Week:   . Minutes of Exercise per Session:   Stress:   . Feeling of Stress :   Social Connections:   . Frequency of Communication with Friends and Family:   . Frequency of Social Gatherings with Friends and Family:   . Attends Religious Services:   . Active Member of Clubs or Organizations:   . Attends Archivist Meetings:   Marland Kitchen Marital Status:   Intimate Partner Violence:   . Fear of Current or Ex-Partner:   . Emotionally Abused:   Marland Kitchen Physically Abused:   . Sexually Abused:     Outpatient Medications Prior to Visit  Medication Sig Dispense Refill  . acetaminophen (TYLENOL) 500 MG tablet Take 1,000 mg by mouth every 6 (six) hours as needed for moderate pain.     Marland Kitchen aspirin 81 MG chewable tablet Chew 1 tablet (81 mg total) by mouth daily.    Marland Kitchen atorvastatin (LIPITOR) 80 MG tablet Take 1 tablet (80 mg total) by mouth daily. 90 tablet 1  . carvedilol (COREG) 25 MG tablet TAKE 1 TABLET TWICE DAILY 180 tablet 3  . Ciclopirox 0.77 % gel APPLY ONCE DAILY ON EFFECTED TOE NAIL. 45 g 0  . furosemide (LASIX) 20 MG tablet Take 1  tablet (20 mg total) by mouth daily. 90 tablet 2  . LANTUS 100 UNIT/ML injection INJECT 40 UNITS SUBCUTANEOUSLY TWICE DAILY 20 mL 3  . levothyroxine (SYNTHROID) 25 MCG tablet Take 1 tablet (25 mcg total) by mouth daily before breakfast. 90 tablet 1  . losartan (COZAAR) 25 MG tablet TAKE 1 TABLET EVERY DAY 90 tablet 2  . Multiple Vitamin (MULTIVITAMIN) tablet Take 1 tablet by mouth daily.    Marland Kitchen NOVOLOG 100 UNIT/ML  injection INJECT 10 UNITS SUBCUTANEOUSLY THREE TIMES DAILY WITH MEALS (VIAL EXPIRES 28 DAYS AFTER OPENING) 20 mL 0  . omega-3 fish oil (MAXEPA) 1000 MG CAPS capsule Take 1 capsule by mouth daily.    . polyethylene glycol (MIRALAX / GLYCOLAX) packet Take 17 g by mouth daily as needed for mild constipation.     Marland Kitchen spironolactone (ALDACTONE) 25 MG tablet TAKE 1 TABLET EVERY DAY 90 tablet 2   No facility-administered medications prior to visit.    Allergies  Allergen Reactions  . Other Other (See Comments)    FLAGYL, CIPRO, PROTONIX taken at the same time caused patient to lose her breath and have trouble breathing, requiring hospital stay at [facility], 03/23/14-per patient.   . Lisinopril Cough    ROS Review of Systems  Constitutional: Negative.   HENT: Negative.   Eyes: Negative.   Respiratory: Negative.   Cardiovascular: Negative.   Gastrointestinal: Negative.   Endocrine: Negative.   Genitourinary: Negative.   Musculoskeletal: Negative.   Allergic/Immunologic: Negative.   Neurological: Negative.   Hematological: Negative.   Psychiatric/Behavioral: Negative.       Objective:    Physical Exam Constitutional:      Appearance: She is obese.  HENT:     Head: Normocephalic and atraumatic.     Mouth/Throat:     Mouth: Mucous membranes are moist.  Eyes:     Extraocular Movements: Extraocular movements intact.     Conjunctiva/sclera: Conjunctivae normal.     Pupils: Pupils are equal, round, and reactive to light.  Cardiovascular:     Rate and Rhythm: Normal rate and regular  rhythm.     Pulses: Normal pulses.     Heart sounds: Normal heart sounds.  Pulmonary:     Effort: Pulmonary effort is normal.     Breath sounds: Normal breath sounds.  Abdominal:     General: Bowel sounds are normal.     Palpations: Abdomen is soft.  Musculoskeletal:        General: Normal range of motion.     Cervical back: Normal range of motion and neck supple.  Neurological:     General: No focal deficit present.     Mental Status: She is alert and oriented to person, place, and time.  Psychiatric:        Mood and Affect: Mood normal.        Behavior: Behavior normal.        Thought Content: Thought content normal.        Judgment: Judgment normal.     BP 106/65 (BP Location: Left Arm, Patient Position: Sitting, Cuff Size: Normal)   Pulse 88   Temp 98.4 F (36.9 C) (Oral)   Ht 5\' 5"  (1.651 m)   Wt 244 lb 1.3 oz (110.7 kg)   SpO2 94%   BMI 40.62 kg/m  Wt Readings from Last 3 Encounters:  05/01/20 244 lb 1.3 oz (110.7 kg)  03/07/20 243 lb 0.6 oz (110.2 kg)  02/15/20 243 lb (110.2 kg)     Health Maintenance Due  Topic Date Due  . OPHTHALMOLOGY EXAM  Never done  . TETANUS/TDAP  Never done  . DEXA SCAN  Never done  . PNA vac Low Risk Adult (1 of 2 - PCV13) 11/20/2013  . INFLUENZA VACCINE  04/20/2020    There are no preventive care reminders to display for this patient.  Lab Results  Component Value Date   TSH 5.62 (H) 03/14/2020   Lab Results  Component Value Date  WBC 5.6 03/14/2020   HGB 11.3 (L) 03/14/2020   HCT 34.8 (L) 03/14/2020   MCV 86.4 03/14/2020   PLT 199 03/14/2020   Lab Results  Component Value Date   NA 135 03/14/2020   K 5.5 (H) 03/14/2020   CO2 26 03/14/2020   GLUCOSE 162 (H) 03/14/2020   BUN 33 (H) 03/14/2020   CREATININE 1.32 (H) 03/14/2020   BILITOT 0.6 03/14/2020   ALKPHOS 90 06/14/2015   AST 20 03/14/2020   ALT 30 (H) 03/14/2020   PROT 7.9 03/14/2020   ALBUMIN 4.0 06/14/2015   CALCIUM 9.9 03/14/2020   ANIONGAP 8  04/30/2019   Lab Results  Component Value Date   CHOL 76 03/14/2020   Lab Results  Component Value Date   HDL 33 (L) 03/14/2020   Lab Results  Component Value Date   LDLCALC 27 03/14/2020   Lab Results  Component Value Date   TRIG 75 03/14/2020   Lab Results  Component Value Date   CHOLHDL 2.3 03/14/2020   Lab Results  Component Value Date   HGBA1C 9.3 (H) 03/14/2020      Assessment & Plan:   Problem List Items Addressed This Visit      Cardiovascular and Mediastinum   Chronic combined systolic and diastolic CHF (congestive heart failure) (HCC)     Respiratory   OSA (obstructive sleep apnea)     Endocrine   Hypothyroidism     Other   Morbid obesity ([place])    Other Visit Diagnoses    Type 2 diabetes mellitus with hyperglycemia, with long-term current use of insulin ([place])    -  Primary     Hypothyroidism: TSH was noted to be elevated on recent labs. -Her levothyroxine dose was increased from 25 mcg to 50 mcg.  Continue same.  -Follow-up in 3 months.  We will repeat TSH on next visit  Uncontrolled type 2 diabetes mellitus: Last A1c 9.3%. -Lantus was increased from 40 units to 48 units at bedtime and NovoLog increased from 10 units to 12 units 3 times daily.  Patient tells me that her blood sugar readings has improved at home. -Continue same.  Advise carb consistent diet, exercise and weight loss. -Follow-up in 3 months.  We will repeat A1c on next visit  AKI: -Could be her baseline due to long term diabetes/hypertension -Follow-up in 3 months.  We will repeat CMP on next visit.  Will refer patient to nephrology if he continues to have worsening kidney function.  Plan discussed with the patient and she verbalized understanding.  Morbid obesity with BMI of 40.  Discussed about diet modification, exercise and weight loss.  Chronic combined CHF with ejection fraction of 30 to 35%.  Ischemic cardiomyopathy status post ICD placement: Stable.  Patient appears  euvolemic on exam.  No leg swelling. -Has an appointment with cardiology next month. -Continue aspirin, statin, Lasix, Aldactone and losartan.  Abnormal mammogram: -Repeat diagnostic mammogram and ultrasound of right breast came back negative for malignancy.  Discussed results with the patient and she verbalized understanding.  Follow-up in 3 months.  She will let me know about DEXA scan on next visit.  Repeat labs on next visit. No orders of the defined types were placed in this encounter.   Follow-up: Return in about 3 months (around 08/01/2020).    Mckinley Jewel, MD

## 2020-05-01 NOTE — Progress Notes (Deleted)
Established Patient Office Visit  Subjective:  Patient ID: [name], female    DOB: 06-10-1951  Age: 69 y.o. MRN: [id number]  CC: No chief complaint on file.   HPI [name] presents for ***  Past Medical History:  Diagnosis Date  . Anemia   . Arthritis   . CAD (coronary artery disease)    a. LHC (03/2014) Lmain: nl, LAD: diff dz proximal 20%, 30-40% dz mid vessel prior 2nd diagonal, LCx: 40% in OM1, 20-30% in OM2, RCA: 99% subtotal occlusion @ crux, TIMI 1 flow, L-R collaterals to distal vessel  . Chronic systolic heart failure ([place])    a. ECHO (03/2014): EF 25-30%, akinesis enteroanteroseptal myocardium, grade III DD b. RHC (03/2014) RA 4, RV 42/4, PA 44/14 (26), PCWP 21, PA 62% Fick CO/CI 6.9 / 3.4  . Diabetes mellitus   . Duodenal ulcer    Age 62  . Erosive esophagitis   . Gastritis   . Hypertension   . Ischemic cardiomyopathy   . Obese   . Short-term memory loss   . Thyroid disease   . TTP (thrombotic thrombocytopenic purpura) (HCC)     Past Surgical History:  Procedure Laterality Date  . BIV ICD INSERTION CRT-D N/A 05/03/2019   Procedure: BIV ICD INSERTION CRT-D;  Surgeon: Thompson Grayer, MD;  Location: [facility];  Service: Cardiovascular;  Laterality: N/A;  . COLONOSCOPY  08/29/2012   YHC:[id number] diverticulosis  . COLONOSCOPY N/A 05/28/2015   Procedure: COLONOSCOPY;  Surgeon: Daneil Dolin, MD;  [facility];  Service: Endoscopy;  Laterality: N/A;  1015  . ECTOPIC PREGNANCY SURGERY    . ESOPHAGOGASTRODUODENOSCOPY  04/20/2012   BTD:[id number] antral gastritis/Erosive reflux esophagitis  . LEFT AND RIGHT HEART CATHETERIZATION WITH CORONARY ANGIOGRAM N/A 03/28/2014   Procedure: LEFT AND RIGHT HEART CATHETERIZATION WITH CORONARY ANGIOGRAM;  Surgeon: Peter M Martinique, MD;  Location: [facility] CATH LAB;  Service: Cardiovascular;  Laterality: N/A;  . SPLENECTOMY, PARTIAL      Family History  Problem Relation Age of Onset  . Heart failure Mother   .  Cirrhosis Father        ETOH  . Diabetes Daughter   . Breast cancer Maternal Aunt   . Colon cancer Neg Hx     Social History   Socioeconomic History  . Marital status: Divorced    Spouse name: Not on file  . Number of children: 3  . Years of education: Not on file  . Highest education level: Not on file  Occupational History  . Occupation: disabled    Fish farm manager: NOT EMPLOYED  Tobacco Use  . Smoking status: Former Smoker    Packs/day: 0.25    Years: 10.00    Pack years: 2.50    Types: Cigarettes    Start date: 09/20/1966    Quit date: 09/20/1978    Years since quitting: 41.6  . Smokeless tobacco: Never Used  . Tobacco comment: Quit 2008  Vaping Use  . Vaping Use: Never used  Substance and Sexual Activity  . Alcohol use: No    Alcohol/week: 0.0 standard drinks  . Drug use: No  . Sexual activity: Not on file  Other Topics Concern  . Not on file  Social History Narrative   Lives w/ daughter, [name]         Social Determinants of Health   Financial Resource Strain:   . Difficulty of Paying Living Expenses:   Food Insecurity:   . Worried  About Running Out of Food in the Last Year:   . [place] in the Last Year:   Transportation Needs:   . Lack of Transportation (Medical):   Marland Kitchen Lack of Transportation (Non-Medical):   Physical Activity:   . Days of Exercise per Week:   . Minutes of Exercise per Session:   Stress:   . Feeling of Stress :   Social Connections:   . Frequency of Communication with Friends and Family:   . Frequency of Social Gatherings with Friends and Family:   . Attends Religious Services:   . Active Member of Clubs or Organizations:   . Attends Archivist Meetings:   Marland Kitchen Marital Status:   Intimate Partner Violence:   . Fear of Current or Ex-Partner:   . Emotionally Abused:   Marland Kitchen Physically Abused:   . Sexually Abused:     Outpatient Medications Prior to Visit  Medication Sig Dispense Refill  . acetaminophen (TYLENOL) 500 MG  tablet Take 1,000 mg by mouth every 6 (six) hours as needed for moderate pain.     Marland Kitchen aspirin 81 MG chewable tablet Chew 1 tablet (81 mg total) by mouth daily.    Marland Kitchen atorvastatin (LIPITOR) 80 MG tablet Take 1 tablet (80 mg total) by mouth daily. 90 tablet 1  . carvedilol (COREG) 25 MG tablet TAKE 1 TABLET TWICE DAILY 180 tablet 3  . Ciclopirox 0.77 % gel APPLY ONCE DAILY ON EFFECTED TOE NAIL. 45 g 0  . furosemide (LASIX) 20 MG tablet Take 1 tablet (20 mg total) by mouth daily. 90 tablet 2  . LANTUS 100 UNIT/ML injection INJECT 40 UNITS SUBCUTANEOUSLY TWICE DAILY 20 mL 3  . levothyroxine (SYNTHROID) 25 MCG tablet Take 1 tablet (25 mcg total) by mouth daily before breakfast. 90 tablet 1  . losartan (COZAAR) 25 MG tablet TAKE 1 TABLET EVERY DAY 90 tablet 2  . Multiple Vitamin (MULTIVITAMIN) tablet Take 1 tablet by mouth daily.    Marland Kitchen NOVOLOG 100 UNIT/ML injection INJECT 10 UNITS SUBCUTANEOUSLY THREE TIMES DAILY WITH MEALS (VIAL EXPIRES 28 DAYS AFTER OPENING) 20 mL 0  . omega-3 fish oil (MAXEPA) 1000 MG CAPS capsule Take 1 capsule by mouth daily.    . polyethylene glycol (MIRALAX / GLYCOLAX) packet Take 17 g by mouth daily as needed for mild constipation.     Marland Kitchen spironolactone (ALDACTONE) 25 MG tablet TAKE 1 TABLET EVERY DAY 90 tablet 2   No facility-administered medications prior to visit.    Allergies  Allergen Reactions  . Other Other (See Comments)    FLAGYL, CIPRO, PROTONIX taken at the same time caused patient to lose her breath and have trouble breathing, requiring hospital stay at [facility], 03/23/14-per patient.   . Lisinopril Cough    ROS Review of Systems    Objective:    Physical Exam  BP 106/65 (BP Location: Left Arm, Patient Position: Sitting, Cuff Size: Normal)   Pulse 88   Temp 98.4 F (36.9 C) (Oral)   Ht 5\' 5"  (1.651 m)   Wt 244 lb 1.3 oz (110.7 kg)   SpO2 94%   BMI 40.62 kg/m  Wt Readings from Last 3 Encounters:  05/01/20 244 lb 1.3 oz (110.7 kg)  03/07/20 243 lb 0.6 oz  (110.2 kg)  02/15/20 243 lb (110.2 kg)     Health Maintenance Due  Topic Date Due  . OPHTHALMOLOGY EXAM  Never done  . TETANUS/TDAP  Never done  . DEXA SCAN  Never done  .  PNA vac Low Risk Adult (1 of 2 - PCV13) 11/20/2013  . INFLUENZA VACCINE  04/20/2020    There are no preventive care reminders to display for this patient.  Lab Results  Component Value Date   TSH 5.62 (H) 03/14/2020   Lab Results  Component Value Date   WBC 5.6 03/14/2020   HGB 11.3 (L) 03/14/2020   HCT 34.8 (L) 03/14/2020   MCV 86.4 03/14/2020   PLT 199 03/14/2020   Lab Results  Component Value Date   NA 135 03/14/2020   K 5.5 (H) 03/14/2020   CO2 26 03/14/2020   GLUCOSE 162 (H) 03/14/2020   BUN 33 (H) 03/14/2020   CREATININE 1.32 (H) 03/14/2020   BILITOT 0.6 03/14/2020   ALKPHOS 90 06/14/2015   AST 20 03/14/2020   ALT 30 (H) 03/14/2020   PROT 7.9 03/14/2020   ALBUMIN 4.0 06/14/2015   CALCIUM 9.9 03/14/2020   ANIONGAP 8 04/30/2019   Lab Results  Component Value Date   CHOL 76 03/14/2020   Lab Results  Component Value Date   HDL 33 (L) 03/14/2020   Lab Results  Component Value Date   LDLCALC 27 03/14/2020   Lab Results  Component Value Date   TRIG 75 03/14/2020   Lab Results  Component Value Date   CHOLHDL 2.3 03/14/2020   Lab Results  Component Value Date   HGBA1C 9.3 (H) 03/14/2020      Assessment & Plan:   Problem List Items Addressed This Visit      Cardiovascular and Mediastinum   Chronic combined systolic and diastolic CHF (congestive heart failure) ([place])    Other Visit Diagnoses    Type 2 diabetes mellitus with hyperglycemia, with long-term current use of insulin ([place])    -  Primary   Acquired hypothyroidism       Morbid obesity ([place])          No orders of the defined types were placed in this encounter.   Follow-up: No follow-ups on file.    Mckinley Jewel, MD

## 2020-05-05 ENCOUNTER — Other Ambulatory Visit: Payer: Self-pay | Admitting: Internal Medicine

## 2020-05-05 ENCOUNTER — Ambulatory Visit (INDEPENDENT_AMBULATORY_CARE_PROVIDER_SITE_OTHER): Payer: Medicare HMO

## 2020-05-05 DIAGNOSIS — I5022 Chronic systolic (congestive) heart failure: Secondary | ICD-10-CM

## 2020-05-05 DIAGNOSIS — Z95810 Presence of automatic (implantable) cardiac defibrillator: Secondary | ICD-10-CM

## 2020-05-07 NOTE — Progress Notes (Signed)
EPIC Encounter for ICM Monitoring  Patient Name: [name] is a 69 y.o. female Date: 05/07/2020 Primary Care Physican: Mckinley Jewel, MD Primary Cardiologist:Branch Electrophysiologist:Allred Bi-V Pacing:99% 6/9/2021Weight:242lbs   Spoke with patient. Denies fluid symptoms.   CorvueThoracic impedancenormal.   Prescribed: Furosemide20 mg take 1 tablet by mouth dailyand per Dr Nelly Laurence 10/08/19 OV note, she may take 40 mg as needed.  Labs: 08/10/2020Creatinine 0.96, BUN 19, Potassium 4.1, Sodium 137, GFR 60->60  Recommendations:No changes and encouraged to call if experiencing any fluid symptoms.  Follow-up plan: ICM clinic phone appointment on9/20/2021. 91 day device clinic remote transmission8/23/2021.    EP/Cardiology Office Visits: 06/19/2020 with Dr. Harl Bowie.    Copy of ICM check sent to Dr. Rayann Heman.  3 month ICM trend: 05/06/2020    1 Year ICM trend:       [name], RN 05/07/2020 4:45 PM

## 2020-05-12 ENCOUNTER — Ambulatory Visit (INDEPENDENT_AMBULATORY_CARE_PROVIDER_SITE_OTHER): Payer: Medicare HMO | Admitting: *Deleted

## 2020-05-12 DIAGNOSIS — Z95810 Presence of automatic (implantable) cardiac defibrillator: Secondary | ICD-10-CM

## 2020-05-13 LAB — CUP PACEART REMOTE DEVICE CHECK
Battery Remaining Longevity: 56 mo
Battery Remaining Percentage: 81 %
Battery Voltage: 2.99 V
Brady Statistic AP VP Percent: 1 %
Brady Statistic AP VS Percent: 1 %
Brady Statistic AS VP Percent: 99 %
Brady Statistic AS VS Percent: 1 %
Brady Statistic RA Percent Paced: 1 %
Date Time Interrogation Session: 20210823020015
HighPow Impedance: 65 Ohm
HighPow Impedance: 65 Ohm
Implantable Lead Implant Date: 20200813
Implantable Lead Implant Date: 20200813
Implantable Lead Implant Date: 20200813
Implantable Lead Location: 753858
Implantable Lead Location: 753859
Implantable Lead Location: 753860
Implantable Pulse Generator Implant Date: 20200813
Lead Channel Impedance Value: 380 Ohm
Lead Channel Impedance Value: 430 Ohm
Lead Channel Impedance Value: 490 Ohm
Lead Channel Pacing Threshold Amplitude: 0.5 V
Lead Channel Pacing Threshold Amplitude: 0.5 V
Lead Channel Pacing Threshold Amplitude: 2.125 V
Lead Channel Pacing Threshold Pulse Width: 0.4 ms
Lead Channel Pacing Threshold Pulse Width: 0.5 ms
Lead Channel Pacing Threshold Pulse Width: 0.6 ms
Lead Channel Sensing Intrinsic Amplitude: 12 mV
Lead Channel Sensing Intrinsic Amplitude: 4.9 mV
Lead Channel Setting Pacing Amplitude: 2 V
Lead Channel Setting Pacing Amplitude: 2 V
Lead Channel Setting Pacing Amplitude: 3.125
Lead Channel Setting Pacing Pulse Width: 0.5 ms
Lead Channel Setting Pacing Pulse Width: 0.6 ms
Lead Channel Setting Sensing Sensitivity: 0.5 mV
Pulse Gen Serial Number: 9891600

## 2020-05-19 NOTE — Progress Notes (Signed)
Remote ICD transmission.   

## 2020-05-24 DIAGNOSIS — G4733 Obstructive sleep apnea (adult) (pediatric): Secondary | ICD-10-CM | POA: Diagnosis not present

## 2020-05-26 ENCOUNTER — Other Ambulatory Visit: Payer: Self-pay | Admitting: Internal Medicine

## 2020-05-30 ENCOUNTER — Other Ambulatory Visit: Payer: Self-pay | Admitting: Cardiology

## 2020-06-06 ENCOUNTER — Other Ambulatory Visit: Payer: Self-pay | Admitting: *Deleted

## 2020-06-06 MED ORDER — CICLOPIROX 0.77 % EX GEL: CUTANEOUS | 0 refills | Status: DC

## 2020-06-09 ENCOUNTER — Ambulatory Visit (INDEPENDENT_AMBULATORY_CARE_PROVIDER_SITE_OTHER): Payer: Medicare HMO

## 2020-06-09 DIAGNOSIS — Z95810 Presence of automatic (implantable) cardiac defibrillator: Secondary | ICD-10-CM

## 2020-06-09 DIAGNOSIS — I5022 Chronic systolic (congestive) heart failure: Secondary | ICD-10-CM | POA: Diagnosis not present

## 2020-06-11 NOTE — Progress Notes (Signed)
EPIC Encounter for ICM Monitoring  Patient Name: [name] is a 69 y.o. female Date: 06/11/2020 Primary Care Physican: Mckinley Jewel, MD Primary Cardiologist:Branch Electrophysiologist:Allred Bi-V Pacing:99% 9/22/2021Weight:246lbs   Spoke with patient and reports feeling well at this time.  Denies fluid symptoms.    CorvueThoracic impedancenormal.   Prescribed: Furosemide20 mg take 1 tablet by mouth dailyand per Dr Nelly Laurence 10/08/19 OV note, she may take 40 mg as needed.  Labs: 03/14/2020 Creatinine 1.32, BUN 33, Potassium 5.5, Sodium 135, GFR 40-47 A complete set of results can be found in Results Review.  Recommendations: No changes and encouraged to call if experiencing any fluid symptoms.  Follow-up plan: ICM clinic phone appointment on10/25/2021. 91 day device clinic remote transmission11/22/2021.   EP/Cardiology Office Visits:06/19/2020 with Dr.Branch.   Copy of ICM check sent to Dr.Allred.  3 month ICM trend: 06/09/2020    1 Year ICM trend:       [name], RN 06/11/2020 8:54 AM

## 2020-06-19 ENCOUNTER — Encounter: Payer: Self-pay | Admitting: Cardiology

## 2020-06-19 ENCOUNTER — Ambulatory Visit (INDEPENDENT_AMBULATORY_CARE_PROVIDER_SITE_OTHER): Payer: Medicare HMO | Admitting: Cardiology

## 2020-06-19 VITALS — BP 108/64 | HR 82 | Ht 65.0 in | Wt 243.0 lb

## 2020-06-19 DIAGNOSIS — I2510 Atherosclerotic heart disease of native coronary artery without angina pectoris: Secondary | ICD-10-CM | POA: Diagnosis not present

## 2020-06-19 DIAGNOSIS — Z23 Encounter for immunization: Secondary | ICD-10-CM | POA: Diagnosis not present

## 2020-06-19 DIAGNOSIS — I5022 Chronic systolic (congestive) heart failure: Secondary | ICD-10-CM

## 2020-06-19 DIAGNOSIS — E782 Mixed hyperlipidemia: Secondary | ICD-10-CM

## 2020-06-19 NOTE — Progress Notes (Signed)
Clinical Summary Ms. Meyerhoff is a 69 y.o.female seen today for follow up of the following meidcal problems.   1. Chronic combined systolicand diastolic heart failure - echo 03/2014 LVEF 25-30%, restrictive diastolic dysfunction. New diagnosis at that time. Repeat echo 06/2014 LVEF 40-45% - cath 03/2014 showed RCA 99% with left to right collaterals, other arteries patent. Overall LV systolic dysfunction out of proportion to CAD. Medically managed.  - 07/2015 echo LVEF 35%, grade I diastolic dysfunction - Entresto causes dizziness and nausea, stopped taking. Back on losartan - due to low bp's we lowered losartan to 25mg  daily. Lowered lasix to 20mg  daily, may take 40mg  as needed.   01/2018 echo LVEF 35%, grade I diastoilc dysfunction - 11/2018 echo LVEF 30-35% - 05/03/19 had BIV AICD placed by Dr Rayann Heman  11/2019 LVEF 30-35%   - weight stable around 243 lbs. No recent edema, no SOB or DOE - compliant with med.  - 04/2020 normal function  2. CAD - cath 03/2014 with 99% chronic RCA disease, otherwise patent vessels. Managed medically  -denies any chest pains  3. OSA - on bipap at night. Followed by Dr Radford Pax.  4. Hyperlipidemia -upcoming labs next month 02/2020 TC 76 HDL 33 TG 75 LDL 27  5. Chronic LBBB  6. Elevated Cr - noted by 02/2020 labs - needs repeat study.    SH: has completed covid vaccine x 2, moderna    Past Medical History:  Diagnosis Date  . Anemia   . Arthritis   . CAD (coronary artery disease)    a. LHC (03/2014) Lmain: nl, LAD: diff dz proximal 20%, 30-40% dz mid vessel prior 2nd diagonal, LCx: 40% in OM1, 20-30% in OM2, RCA: 99% subtotal occlusion @ crux, TIMI 1 flow, L-R collaterals to distal vessel  . Chronic systolic heart failure ([place])    a. ECHO (03/2014): EF 25-30%, akinesis enteroanteroseptal myocardium, grade III DD b. RHC (03/2014) RA 4, RV 42/4, PA 44/14 (26), PCWP 21, PA 62% Fick CO/CI 6.9 / 3.4  . Diabetes mellitus   .  Duodenal ulcer    Age 10  . Erosive esophagitis   . Gastritis   . Hypertension   . Ischemic cardiomyopathy   . Obese   . Short-term memory loss   . Thyroid disease   . TTP (thrombotic thrombocytopenic purpura) (H[facility])      Allergies  Allergen Reactions  . Other Other (See Comments)    FLAGYL, CIPRO, PROTONIX taken at the same time caused patient to lose her breath and have trouble breathing, requiring hospital stay at [facility], 03/23/14-per patient.   . Lisinopril Cough     Current Outpatient Medications  Medication Sig Dispense Refill  . acetaminophen (TYLENOL) 500 MG tablet Take 1,000 mg by mouth every 6 (six) hours as needed for moderate pain.     Marland Kitchen aspirin 81 MG chewable tablet Chew 1 tablet (81 mg total) by mouth daily.    Marland Kitchen atorvastatin (LIPITOR) 80 MG tablet TAKE 1 TABLET EVERY DAY 90 tablet 1  . carvedilol (COREG) 25 MG tablet TAKE 1 TABLET TWICE DAILY 180 tablet 3  . Ciclopirox 0.77 % gel APPLY ONCE DAILY ON THE AFFECTED TOE NAIL. 45 g 0  . furosemide (LASIX) 20 MG tablet Take 1 tablet (20 mg total) by mouth daily. 90 tablet 2  . LANTUS 100 UNIT/ML injection INJECT 40 UNITS SUBCUTANEOUSLY TWICE DAILY 70 mL 0  . levothyroxine (SYNTHROID) 25 MCG tablet Take 1 tablet (25 mcg total) by  mouth daily before breakfast. 90 tablet 1  . losartan (COZAAR) 25 MG tablet TAKE 1 TABLET EVERY DAY 90 tablet 2  . Multiple Vitamin (MULTIVITAMIN) tablet Take 1 tablet by mouth daily.    Marland Kitchen NOVOLOG 100 UNIT/ML injection INJECT 10 UNITS SUBCUTANEOUSLY THREE TIMES DAILY WITH MEALS (VIAL EXPIRES 28 DAYS AFTER OPENING) 20 mL 0  . omega-3 fish oil (MAXEPA) 1000 MG CAPS capsule Take 1 capsule by mouth daily.    . polyethylene glycol (MIRALAX / GLYCOLAX) packet Take 17 g by mouth daily as needed for mild constipation.     Marland Kitchen spironolactone (ALDACTONE) 25 MG tablet TAKE 1 TABLET EVERY DAY 90 tablet 1   No current facility-administered medications for this visit.     Past Surgical History:  Procedure  Laterality Date  . BIV ICD INSERTION CRT-D N/A 05/03/2019   Procedure: BIV ICD INSERTION CRT-D;  Surgeon: Thompson Grayer, MD;  Location: [place] [facility];  Service: Cardiovascular;  Laterality: N/A;  . COLONOSCOPY  08/29/2012   WHQ:PRFFMBW diverticulosis  . COLONOSCOPY N/A 05/28/2015   Procedure: COLONOSCOPY;  Surgeon: Daneil Dolin, MD;  Location: [facility];  Service: Endoscopy;  Laterality: N/A;  1015  . ECTOPIC PREGNANCY SURGERY    . ESOPHAGOGASTRODUODENOSCOPY  04/20/2012   GYK:[id number] antral gastritis/Erosive reflux esophagitis  . LEFT AND RIGHT HEART CATHETERIZATION WITH CORONARY ANGIOGRAM N/A 03/28/2014   Procedure: LEFT AND RIGHT HEART CATHETERIZATION WITH CORONARY ANGIOGRAM;  Surgeon: Peter M Martinique, MD;  Location: [facility] CATH LAB;  Service: Cardiovascular;  Laterality: N/A;  . SPLENECTOMY, PARTIAL       Allergies  Allergen Reactions  . Other Other (See Comments)    FLAGYL, CIPRO, PROTONIX taken at the same time caused patient to lose her breath and have trouble breathing, requiring hospital stay at [facility], 03/23/14-per patient.   . Lisinopril Cough      Family History  Problem Relation Age of Onset  . Heart failure Mother   . Cirrhosis Father        ETOH  . Diabetes Daughter   . Breast cancer Maternal Aunt   . Colon cancer Neg Hx      Social History Ms. Cinque reports that she quit smoking about 41 years ago. Her smoking use included cigarettes. She started smoking about 53 years ago. She has a 2.50 pack-year smoking history. She has never used smokeless tobacco. Ms. Adler reports no history of alcohol use.   Review of Systems CONSTITUTIONAL: No weight loss, fever, chills, weakness or fatigue.  HEENT: Eyes: No visual loss, blurred vision, double vision or yellow sclerae.No hearing loss, sneezing, congestion, runny nose or sore throat.  SKIN: No rash or itching.  CARDIOVASCULAR: per hpi RESPIRATORY: No shortness of breath, cough or sputum.  GASTROINTESTINAL: No anorexia,  nausea, vomiting or diarrhea. No abdominal pain or blood.  GENITOURINARY: No burning on urination, no polyuria NEUROLOGICAL: No headache, dizziness, syncope, paralysis, ataxia, numbness or tingling in the extremities. No change in bowel or bladder control.  MUSCULOSKELETAL: No muscle, back pain, joint pain or stiffness.  LYMPHATICS: No enlarged nodes. No history of splenectomy.  PSYCHIATRIC: No history of depression or anxiety.  ENDOCRINOLOGIC: No reports of sweating, cold or heat intolerance. No polyuria or polydipsia.  Marland Kitchen   Physical Examination Today's Vitals   06/19/20 1305  BP: 108/64  Pulse: 82  SpO2: 95%  Weight: 243 lb (110.2 kg)  Height: 5\' 5"  (1.651 m)   Body mass index is 40.44 kg/m.  Gen: resting comfortably, no  acute distress HEENT: no scleral icterus, pupils equal round and reactive, no palptable cervical adenopathy,  CV: RRR, no m/rg, no jvd Resp: Clear to auscultation bilaterally GI: abdomen is soft, non-tender, non-distended, normal bowel sounds, no hepatosplenomegaly MSK: extremities are warm, no edema.  Skin: warm, no rash Neuro:  no focal deficits Psych: appropriate affect   Diagnostic Studies 03/2014 echo Study Conclusions  - Left ventricle: The cavity size was normal. There was mild focal basal hypertrophy of the septum. Systolic function was severely reduced. The estimated ejection fraction was in the range of 25% to 30%. There is akinesis of the entireanteroseptal myocardium. Doppler parameters are consistent with a reversible restrictive pattern, indicative of decreased left ventricular diastolic compliance and/or increased left atrial pressure (grade 3 diastolic dysfunction). - Mitral valve: There was mild regurgitation. - Left atrium: The atrium was moderately dilated. 03/2014 Cath Procedural Findings:  Hemodynamics  RA 8/0 mean 4 mm Hg  RV 42/4 mm Hg  PA 44/14 mean 26 mm Hg  PCWP 20/28 mean 21 mm Hg  LV 98/18 mm Hg  AO 95/54 mean  72 mm Hg  Oxygen saturations:  PA 62%  AO 90%  Cardiac Output (Fick) 6.9 L/min  Cardiac Index (Fick) 3.4 L/min/meter squared  Coronary angiography:  Coronary dominance: right  Left mainstem: Normal  Left anterior descending (LAD): Diffuse disease in the proximal vessel up to 20%. 30-40% disease in the mid vessel prior to the second diagonal.  Left circumflex (LCx): 40% in OM1. 20-30% in OM2. Otherwise no significant disease.  Right coronary artery (RCA): 99% subtotal occlusion at the crux. TIMI 1 flow. Left to right collaterals to the distal vessel.  Left ventriculography: Left ventricular systolic function is abnormal, LVEF is estimated at 25%. There is inferior akinesis and severe global hypokinesis. There is mild mitral regurgitation  Final Conclusions:  1. Single vessel obstructive CAD with subtotal occlusion of the RCA at the crux.  2. Severe LV dysfunction.  3. Normal Right heart pressures. Elevated PCWP.  Recommendations: Medical management. Her degree of LV dysfunction is out of proportion to her CAD. She has no anginal symptoms so PCI of RCA not indicated. The inferior wall appears scarred.  07/2015 echo Study Conclusions  - Left ventricle: The cavity size was at the upper limits of normal. Wall thickness was increased in a pattern of moderate LVH. The estimated ejection fraction was 40% (similar calculation per biplane speckle tracking, reduced global longitudinal strain of -14.3%). There is akinesis of the basalinferolateral and inferior myocardium. Doppler parameters are consistent with abnormal left ventricular relaxation (grade 1 diastolic dysfunction). - Aortic valve: Mildly calcified annulus. Trileaflet. - Mitral valve: Calcified annulus. There was trivial regurgitation. - Left atrium: The atrium was at the upper limits of normal in size. - Right atrium: Central venous pressure (est): 3 mm Hg. - Atrial septum: A patent foramen  ovale cannot be excluded. - Tricuspid valve: There was trivial regurgitation. - Pulmonary arteries: Systolic pressure could not be accurately estimated. - Pericardium, extracardiac: There was no pericardial effusion.  Impressions:  - Upper normal LV chamber size with moderate LVH and LVEF approximately 40% as discussed above. There is akinesis of the basal inferoseptal and inferior myocardium. Grade 1 diastolic dysfunction. Compared to the previous study from October 2015, LVEF is in similar range, perhaps slightly reduced. Upper normal left atrial chamber size. Trivial tricuspid regurgitation. Cannot exclude PFO based on limited images.   01/2018 echo Study Conclusions  - Left ventricle: The cavity size was  normal. Wall thickness was increased in a pattern of mild LVH. The estimated ejection fraction was 35%. There is akinesis of the basal-midinferior myocardium. There is akinesis of the mid-apicalanteroseptal and apical myocardium. Doppler parameters are consistent with abnormal left ventricular relaxation (grade 1 diastolic dysfunction). - Ventricular septum: Septal motion showed abnormal function and dyssynergy suggestive of left bundle Leler Brion block. - Aortic valve: Moderately calcified annulus. Trileaflet. - Mitral valve: There was mild regurgitation. - Right atrium: Central venous pressure (est): 3 mm Hg. - Atrial septum: No defect or patent foramen ovale was identified. - Tricuspid valve: There was trivial regurgitation. - Pulmonary arteries: Systolic pressure could not be accurately estimated. - Pericardium, extracardiac: There was no pericardial effusion   11/2018 echo 1. The left ventricle has moderate-severely reduced systolic function, with an ejection fraction of 30-35%. The cavity size was normal. There is mildly increased left ventricular wall thickness. Left ventricular diastolic Doppler parameters are  consistent with  impaired relaxation. Elevated mean left atrial pressure. 2. The anteroseptal, anterolateral, apical walls are hypokinetic. 3. The right ventricle has normal systolic function. The cavity was normal. There is no increase in right ventricular wall thickness. 4. Left atrial size was moderately dilated. 5. No evidence of mitral valve stenosis. 6. The aortic valve is tricuspid no stenosis of the aortic valve. 7. Pulmonary hypertension is indeterminant, inadequate TR jet. 8. The inferior vena cava was dilated in size with <50% respiratory variability. 9. The interatrial septum was not well visualized.    Assessment and Plan  1. Chronic combined systolic/diastolic HF - medical therapy limited by soft bp's. Did not tolerate entresto. She is on her maximally tolerated regimen -s/p BIV AICD -no recent symptoms, we will continue current meds _ EKG today show A sense v pacing.   2. CAD - medically managed single vessel RCA disease -no recent symptoms, continue current meds  3.Hyperlipidemia - at goal, continue statin  4. Elevated Cr - increased by 02/2020 labs, repeat study. May adjust diuretic pending results       Arnoldo Lenis, M.D.

## 2020-06-19 NOTE — Patient Instructions (Signed)
Your physician recommends that you schedule a follow-up appointment in: [place]  Your physician recommends that you continue on your current medications as directed. Please refer to the Current Medication list given to you today.  Your physician recommends that you return for lab work BMP/MG  Thank you for choosing [facility]!!

## 2020-06-23 ENCOUNTER — Ambulatory Visit: Payer: Medicare HMO | Admitting: Internal Medicine

## 2020-06-23 DIAGNOSIS — G4733 Obstructive sleep apnea (adult) (pediatric): Secondary | ICD-10-CM | POA: Diagnosis not present

## 2020-06-25 ENCOUNTER — Other Ambulatory Visit: Payer: Self-pay | Admitting: Cardiology

## 2020-06-27 ENCOUNTER — Other Ambulatory Visit: Payer: Self-pay | Admitting: Internal Medicine

## 2020-07-02 ENCOUNTER — Ambulatory Visit: Payer: Medicare HMO | Admitting: Internal Medicine

## 2020-07-08 DIAGNOSIS — G4733 Obstructive sleep apnea (adult) (pediatric): Secondary | ICD-10-CM | POA: Diagnosis not present

## 2020-07-09 ENCOUNTER — Ambulatory Visit (INDEPENDENT_AMBULATORY_CARE_PROVIDER_SITE_OTHER): Payer: Medicare HMO | Admitting: Internal Medicine

## 2020-07-09 ENCOUNTER — Encounter: Payer: Self-pay | Admitting: Internal Medicine

## 2020-07-09 ENCOUNTER — Other Ambulatory Visit: Payer: Self-pay

## 2020-07-09 VITALS — BP 142/78 | HR 91 | Temp 97.4°F | Resp 18 | Ht 65.0 in | Wt 246.4 lb

## 2020-07-09 DIAGNOSIS — E039 Hypothyroidism, unspecified: Secondary | ICD-10-CM | POA: Diagnosis not present

## 2020-07-09 DIAGNOSIS — Z1321 Encounter for screening for nutritional disorder: Secondary | ICD-10-CM | POA: Diagnosis not present

## 2020-07-09 DIAGNOSIS — E1165 Type 2 diabetes mellitus with hyperglycemia: Secondary | ICD-10-CM

## 2020-07-09 DIAGNOSIS — Z0100 Encounter for examination of eyes and vision without abnormal findings: Secondary | ICD-10-CM

## 2020-07-09 DIAGNOSIS — I10 Essential (primary) hypertension: Secondary | ICD-10-CM | POA: Diagnosis not present

## 2020-07-09 DIAGNOSIS — G4733 Obstructive sleep apnea (adult) (pediatric): Secondary | ICD-10-CM

## 2020-07-09 DIAGNOSIS — Z794 Long term (current) use of insulin: Secondary | ICD-10-CM

## 2020-07-09 DIAGNOSIS — Z780 Asymptomatic menopausal state: Secondary | ICD-10-CM | POA: Diagnosis not present

## 2020-07-09 LAB — POCT GLYCOSYLATED HEMOGLOBIN (HGB A1C)
HbA1c POC (<> result, manual entry): 7.8 % (ref 4.0–5.6)
HbA1c, POC (controlled diabetic range): 7.8 % — AB (ref 0.0–7.0)
HbA1c, POC (prediabetic range): 7.8 % — AB (ref 5.7–6.4)
Hemoglobin A1C: 7.8 % — AB (ref 4.0–5.6)

## 2020-07-09 NOTE — Progress Notes (Signed)
Established Patient Office Visit  Subjective:  Patient ID: [name], female    DOB: 03-Jan-1951  Age: 69 y.o. MRN: [id number]  CC:  Chief Complaint  Patient presents with  . Follow-up    follow up pahwani pt just discouraged as she cant get around like she used to     HPI [name] is a 69 year old female with PMH of chronic combined systolic and diastolic CHF with ejection fraction of 30 to 35%, ischemic cardiomyopathy status post ICD placement, hypertension, hyperlipidemia, type 2 diabetes mellitus, obstructive sleep apnea on BiPAP at night, obesity with BMI of 40, coronary artery disease, GERD, hypothyroidism presents in our clinic for follow-up on her chronic medical issues.  Patient has been taking Levothyroxine 50 mcg since the last visit. Patient denies any recent appetite change, sweating, constipation, diarrhea, palpitations or tremors.  Patient states that she is just upset that she is not able to do all the activities she used to do. But overall, she feels better since she was switched to Losartan from [facility] and her lasix dose was reduced to 20 mg instead of 40 mg by her Cardiologist about 3 weeks ago. She denies any orthopnea, PND, chest pain, LE edema. Patient uses CPAP at night for OSA.  Patient's HbA1C is 7.8 today, better compared to 9.3 from last visit. Patient takes Lantus 48 U qHS and NovoLog 12 U TID. She states that her sugar numbers are better now.  Past Medical History:  Diagnosis Date  . Anemia   . Arthritis   . CAD (coronary artery disease)    a. LHC (03/2014) L[place]: nl, LAD: diff dz proximal 20%, 30-40% dz mid vessel prior 2nd diagonal, LCx: 40% in OM1, 20-30% in OM2, RCA: 99% subtotal occlusion @ crux, TIMI 1 flow, L-R collaterals to distal vessel  . Chronic systolic heart failure ([place])    a. ECHO (03/2014): EF 25-30%, akinesis enteroanteroseptal myocardium, grade III DD b. RHC (03/2014) RA 4, RV 42/4, PA 44/14 (26), PCWP 21, PA 62% Fick CO/CI 6.9 /  3.4  . Diabetes mellitus   . Duodenal ulcer    Age 25  . Erosive esophagitis   . Gastritis   . Hypertension   . Ischemic cardiomyopathy   . Obese   . Short-term memory loss   . Thyroid disease   . TTP (thrombotic thrombocytopenic purpura)     Past Surgical History:  Procedure Laterality Date  . BIV ICD INSERTION CRT-D N/A 05/03/2019   Procedure: BIV ICD INSERTION CRT-D;  Surgeon: Thompson Grayer, MD;  Location: [place] [facility];  Service: Cardiovascular;  Laterality: N/A;  . COLONOSCOPY  08/29/2012   WUJ:[id number] diverticulosis  . COLONOSCOPY N/A 05/28/2015   Procedure: COLONOSCOPY;  Surgeon: Daneil Dolin, MD;  Location: [facility];  Service: Endoscopy;  Laterality: N/A;  1015  . ECTOPIC PREGNANCY SURGERY    . ESOPHAGOGASTRODUODENOSCOPY  04/20/2012   WGN:[id number] antral gastritis/Erosive reflux esophagitis  . LEFT AND RIGHT HEART CATHETERIZATION WITH CORONARY ANGIOGRAM N/A 03/28/2014   Procedure: LEFT AND RIGHT HEART CATHETERIZATION WITH CORONARY ANGIOGRAM;  Surgeon: Peter M Martinique, MD;  Location: [facility] CATH LAB;  Service: Cardiovascular;  Laterality: N/A;  . SPLENECTOMY, PARTIAL      Family History  Problem Relation Age of Onset  . Heart failure Mother   . Cirrhosis Father        ETOH  . Diabetes Daughter   . Breast cancer Maternal Aunt   . Colon cancer Neg Hx  Social History   Socioeconomic History  . Marital status: Divorced    Spouse name: Not on file  . Number of children: 3  . Years of education: Not on file  . Highest education level: Not on file  Occupational History  . Occupation: disabled    Fish farm manager: NOT EMPLOYED  Tobacco Use  . Smoking status: Former Smoker    Packs/day: 0.25    Years: 10.00    Pack years: 2.50    Types: Cigarettes    Start date: 09/20/1966    Quit date: 09/20/1978    Years since quitting: 41.8  . Smokeless tobacco: Never Used  Vaping Use  . Vaping Use: Never used  Substance and Sexual Activity  . Alcohol use: No    Alcohol/week:  0.0 standard drinks  . Drug use: No  . Sexual activity: Not on file  Other Topics Concern  . Not on file  Social History Narrative   Lives w/ daughter, [name]         Social Determinants of Health   Financial Resource Strain:   . Difficulty of Paying Living Expenses: Not on file  Food Insecurity:   . Worried About Charity fundraiser in the Last Year: Not on file  . Ran Out of Food in the Last Year: Not on file  Transportation Needs:   . Lack of Transportation (Medical): Not on file  . Lack of Transportation (Non-Medical): Not on file  Physical Activity:   . Days of Exercise per Week: Not on file  . Minutes of Exercise per Session: Not on file  Stress:   . Feeling of Stress : Not on file  Social Connections:   . Frequency of Communication with Friends and Family: Not on file  . Frequency of Social Gatherings with Friends and Family: Not on file  . Attends Religious Services: Not on file  . Active Member of Clubs or Organizations: Not on file  . Attends Archivist Meetings: Not on file  . Marital Status: Not on file  Intimate Partner Violence:   . Fear of Current or Ex-Partner: Not on file  . Emotionally Abused: Not on file  . Physically Abused: Not on file  . Sexually Abused: Not on file    Outpatient Medications Prior to Visit  Medication Sig Dispense Refill  . acetaminophen (TYLENOL) 500 MG tablet Take 1,000 mg by mouth every 6 (six) hours as needed for moderate pain.     Marland Kitchen aspirin 81 MG chewable tablet Chew 1 tablet (81 mg total) by mouth daily.    Marland Kitchen atorvastatin (LIPITOR) 80 MG tablet TAKE 1 TABLET EVERY DAY 90 tablet 1  . carvedilol (COREG) 25 MG tablet TAKE 1 TABLET TWICE DAILY 180 tablet 3  . Ciclopirox 0.77 % gel APPLY ONCE DAILY ON THE AFFECTED TOE NAIL. 30 g 0  . furosemide (LASIX) 20 MG tablet Take 1 tablet (20 mg total) by mouth daily. 90 tablet 2  . LANTUS 100 UNIT/ML injection INJECT 40 UNITS SUBCUTANEOUSLY TWICE DAILY 70 mL 0  .  levothyroxine (SYNTHROID) 50 MCG tablet Take 50 mcg by mouth daily before breakfast.    . losartan (COZAAR) 25 MG tablet TAKE 1 TABLET EVERY DAY 90 tablet 2  . Multiple Vitamin (MULTIVITAMIN) tablet Take 1 tablet by mouth daily.    Marland Kitchen NOVOLOG 100 UNIT/ML injection INJECT 10 UNITS SUBCUTANEOUSLY THREE TIMES DAILY WITH MEALS (VIAL EXPIRES 28 DAYS AFTER OPENING) 20 mL 0  . omega-3 fish oil (MAXEPA)  1000 MG CAPS capsule Take 1 capsule by mouth daily.    . polyethylene glycol (MIRALAX / GLYCOLAX) packet Take 17 g by mouth daily as needed for mild constipation.     Marland Kitchen spironolactone (ALDACTONE) 25 MG tablet TAKE 1 TABLET EVERY DAY 90 tablet 1  . levothyroxine (SYNTHROID) 25 MCG tablet Take 1 tablet (25 mcg total) by mouth daily before breakfast. (Patient taking differently: Take 50 mcg by mouth daily before breakfast. ) 90 tablet 1   No facility-administered medications prior to visit.    Allergies  Allergen Reactions  . Other Other (See Comments)    FLAGYL, CIPRO, PROTONIX taken at the same time caused patient to lose her breath and have trouble breathing, requiring hospital stay at [facility], 03/23/14-per patient.   . Lisinopril Cough    ROS Review of Systems  Constitutional: Negative for chills and fever.  HENT: Negative for congestion, sinus pressure, sinus pain and sore throat.   Eyes: Negative for pain and discharge.  Respiratory: Negative for cough and shortness of breath.   Cardiovascular: Negative for chest pain, palpitations and leg swelling.  Gastrointestinal: Negative for abdominal pain, constipation, diarrhea, nausea and vomiting.  Endocrine: Negative for polydipsia and polyuria.  Genitourinary: Negative for dysuria and hematuria.  Musculoskeletal: Negative for neck pain and neck stiffness.  Skin: Negative for rash.  Neurological: Negative for speech difficulty, weakness, numbness and headaches.  Psychiatric/Behavioral: Negative for agitation and behavioral problems.      Objective:     Physical Exam Vitals reviewed.  Constitutional:      General: She is not in acute distress.    Appearance: She is obese. She is not diaphoretic.  HENT:     Head: Normocephalic and atraumatic.     Nose: Nose normal. No congestion.     Mouth/Throat:     Mouth: Mucous membranes are moist.     Pharynx: No posterior oropharyngeal erythema.  Eyes:     General: No scleral icterus.    Extraocular Movements: Extraocular movements intact.     Pupils: Pupils are equal, round, and reactive to light.  Cardiovascular:     Rate and Rhythm: Normal rate and regular rhythm.     Pulses: Normal pulses.     Heart sounds: No murmur heard.  No friction rub. No gallop.   Pulmonary:     Breath sounds: Normal breath sounds. No wheezing or rales.  Abdominal:     Palpations: Abdomen is soft.     Tenderness: There is no abdominal tenderness. There is no guarding or rebound.  Musculoskeletal:     Cervical back: Neck supple. No tenderness.     Right lower leg: No edema.     Left lower leg: No edema.  Skin:    General: Skin is warm.     Findings: No rash.  Neurological:     General: No focal deficit present.     Mental Status: She is alert and oriented to person, place, and time.     Sensory: No sensory deficit.     Motor: No weakness.  Psychiatric:        Mood and Affect: Mood normal.        Behavior: Behavior normal.     BP (!) 142/78 (BP Location: Right Arm, Patient Position: Sitting, Cuff Size: Normal)   Pulse 91   Temp (!) 97.4 F (36.3 C) (Temporal)   Resp 18   Ht 5\' 5"  (1.651 m)   Wt 246 lb 6.4 oz (111.8 kg)  SpO2 94%   BMI 41.00 kg/m  Wt Readings from Last 3 Encounters:  07/09/20 246 lb 6.4 oz (111.8 kg)  06/19/20 243 lb (110.2 kg)  05/01/20 244 lb 1.3 oz (110.7 kg)     Health Maintenance Due  Topic Date Due  . OPHTHALMOLOGY EXAM  Never done  . TETANUS/TDAP  Never done  . DEXA SCAN  Never done  . PNA vac Low Risk Adult (1 of 2 - PCV13) 11/20/2013    There are no  preventive care reminders to display for this patient.  Lab Results  Component Value Date   TSH 5.62 (H) 03/14/2020   Lab Results  Component Value Date   WBC 5.6 03/14/2020   HGB 11.3 (L) 03/14/2020   HCT 34.8 (L) 03/14/2020   MCV 86.4 03/14/2020   PLT 199 03/14/2020   Lab Results  Component Value Date   NA 135 03/14/2020   K 5.5 (H) 03/14/2020   CO2 26 03/14/2020   GLUCOSE 162 (H) 03/14/2020   BUN 33 (H) 03/14/2020   CREATININE 1.32 (H) 03/14/2020   BILITOT 0.6 03/14/2020   ALKPHOS 90 06/14/2015   AST 20 03/14/2020   ALT 30 (H) 03/14/2020   PROT 7.9 03/14/2020   ALBUMIN 4.0 06/14/2015   CALCIUM 9.9 03/14/2020   ANIONGAP 8 04/30/2019   Lab Results  Component Value Date   CHOL 76 03/14/2020   Lab Results  Component Value Date   HDL 33 (L) 03/14/2020   Lab Results  Component Value Date   LDLCALC 27 03/14/2020   Lab Results  Component Value Date   TRIG 75 03/14/2020   Lab Results  Component Value Date   CHOLHDL 2.3 03/14/2020   Lab Results  Component Value Date   HGBA1C 9.3 (H) 03/14/2020      Assessment & Plan:   Hypothyroidism TSH was noted to be elevated in last visit -Her levothyroxine dose was increased from 25 mcg to 50 mcg.  Continue same.  -Follow-up TSH and free T4  Type 2 diabetes mellitus: Lab Results  Component Value Date   HGBA1C 7.8 (A) 07/09/2020  - Well-controlled with Lantus 48 U qHS and NovoLog 12 U TID - Patient tells me that her blood sugar readings has improved at home. -Continue same.  Advise carb consistent diet, exercise and weight loss. -Follow-up in 3 months.  We will repeat A1c on next visit  HTN BP Readings from Last 1 Encounters:  07/09/20 (!) 142/78   Stable with Losartan, Coreg, Lasix and Aldactone Counseled for compliance with the medications Advised DASH diet and moderate exercise/walking, at least 150 mins/week  Chronic combined CHF with ejection fraction of 30 to 35%.  Ischemic cardiomyopathy status post  ICD placement Stable.  Patient appears euvolemic on exam.  No leg swelling. -F/u with cardiology as scheduled -Continue aspirin, statin, Lasix, Aldactone and losartan.  CKD stage 3 -Could be her baseline due to long term diabetes/hypertension -Follow-up CMP -Will refer patient to nephrology if he continues to have worsening kidney function.  Plan discussed with the patient and she verbalized understanding.  Morbid obesity with BMI of 40.  Discussed about diet modification, exercise and weight loss.  OSA Uses CPAP regularly    No orders of the defined types were placed in this encounter.   Follow-up: Return in about 3 months (around 10/09/2020).    Lindell Spar, MD

## 2020-07-09 NOTE — Patient Instructions (Signed)
Please continue to take your medications as prescribed.  Please get the blood test done in the morning before breakfast (fasting) within a week.  Please continue to take 2 tablets of levothyroxine 25 mcg for now until the current supply is last.  Once you receive 50 mcg in the mail order, please start taking 1 tablet once daily.  Please continue to follow-up with cardiology as scheduled.  Continue to ambulate with the help of walker.  You are being scheduled for DEXA scan for osteoporosis.  Please follow-up with ophthalmology for annual diabetic eye exam.

## 2020-07-14 ENCOUNTER — Ambulatory Visit (INDEPENDENT_AMBULATORY_CARE_PROVIDER_SITE_OTHER): Payer: Medicare HMO

## 2020-07-14 DIAGNOSIS — I5022 Chronic systolic (congestive) heart failure: Secondary | ICD-10-CM

## 2020-07-14 DIAGNOSIS — Z95810 Presence of automatic (implantable) cardiac defibrillator: Secondary | ICD-10-CM | POA: Diagnosis not present

## 2020-07-16 NOTE — Progress Notes (Signed)
EPIC Encounter for ICM Monitoring  Patient Name: [name] is a 69 y.o. female Date: 07/16/2020 Primary Care Physican: Lindell Spar, MD Primary Cardiologist:Branch Electrophysiologist:Allred Bi-V Pacing:99% 9/22/2021Weight:246lbs   Spoke with patient and reports feeling well at this time.  Denies fluid symptoms.    CorvueThoracic impedancenormal.   Prescribed: Furosemide20 mg take 1 tablet by mouth dailyand per Dr Nelly Laurence 10/08/19 OV note, she may take 40 mg as needed.  Labs: 03/14/2020 Creatinine 1.32, BUN 33, Potassium 5.5, Sodium 135, GFR 40-47 A complete set of results can be found in Results Review.  Recommendations: No changes and encouraged to call if experiencing any fluid symptoms.  Follow-up plan: ICM clinic phone appointment on11/29/2021. 91 day device clinic remote transmission11/22/2021.   EP/Cardiology Office Visits:11/06/2020 with Dr.Branch.   Copy of ICM check sent to Dr.Allred.   3 month ICM trend: 07/14/2020    1 Year ICM trend:       [name], RN 07/16/2020 2:31 PM

## 2020-07-17 ENCOUNTER — Other Ambulatory Visit (HOSPITAL_COMMUNITY): Payer: Medicare HMO

## 2020-07-24 DIAGNOSIS — G4733 Obstructive sleep apnea (adult) (pediatric): Secondary | ICD-10-CM | POA: Diagnosis not present

## 2020-07-30 ENCOUNTER — Other Ambulatory Visit: Payer: Self-pay | Admitting: Internal Medicine

## 2020-08-02 ENCOUNTER — Other Ambulatory Visit: Payer: Self-pay | Admitting: Family Medicine

## 2020-08-06 ENCOUNTER — Other Ambulatory Visit: Payer: Self-pay | Admitting: Cardiology

## 2020-08-06 ENCOUNTER — Other Ambulatory Visit: Payer: Self-pay | Admitting: Internal Medicine

## 2020-08-11 ENCOUNTER — Ambulatory Visit (INDEPENDENT_AMBULATORY_CARE_PROVIDER_SITE_OTHER): Payer: Medicare HMO

## 2020-08-11 DIAGNOSIS — I255 Ischemic cardiomyopathy: Secondary | ICD-10-CM

## 2020-08-12 LAB — CUP PACEART REMOTE DEVICE CHECK
Battery Remaining Longevity: 53 mo
Battery Remaining Percentage: 78 %
Battery Voltage: 2.98 V
Brady Statistic AP VP Percent: 1 %
Brady Statistic AP VS Percent: 1 %
Brady Statistic AS VP Percent: 99 %
Brady Statistic AS VS Percent: 1 %
Brady Statistic RA Percent Paced: 1 %
Date Time Interrogation Session: 20211122020030
HighPow Impedance: 69 Ohm
HighPow Impedance: 69 Ohm
Implantable Lead Implant Date: 20200813
Implantable Lead Implant Date: 20200813
Implantable Lead Implant Date: 20200813
Implantable Lead Location: 753858
Implantable Lead Location: 753859
Implantable Lead Location: 753860
Implantable Pulse Generator Implant Date: 20200813
Lead Channel Impedance Value: 400 Ohm
Lead Channel Impedance Value: 410 Ohm
Lead Channel Impedance Value: 450 Ohm
Lead Channel Pacing Threshold Amplitude: 0.5 V
Lead Channel Pacing Threshold Amplitude: 0.5 V
Lead Channel Pacing Threshold Amplitude: 1.625 V
Lead Channel Pacing Threshold Pulse Width: 0.4 ms
Lead Channel Pacing Threshold Pulse Width: 0.5 ms
Lead Channel Pacing Threshold Pulse Width: 0.6 ms
Lead Channel Sensing Intrinsic Amplitude: 12 mV
Lead Channel Sensing Intrinsic Amplitude: 5 mV
Lead Channel Setting Pacing Amplitude: 2 V
Lead Channel Setting Pacing Amplitude: 2 V
Lead Channel Setting Pacing Amplitude: 2.625
Lead Channel Setting Pacing Pulse Width: 0.5 ms
Lead Channel Setting Pacing Pulse Width: 0.6 ms
Lead Channel Setting Sensing Sensitivity: 0.5 mV
Pulse Gen Serial Number: 9891600

## 2020-08-18 ENCOUNTER — Ambulatory Visit (INDEPENDENT_AMBULATORY_CARE_PROVIDER_SITE_OTHER): Payer: Medicare HMO

## 2020-08-18 DIAGNOSIS — I5022 Chronic systolic (congestive) heart failure: Secondary | ICD-10-CM

## 2020-08-18 DIAGNOSIS — Z95810 Presence of automatic (implantable) cardiac defibrillator: Secondary | ICD-10-CM | POA: Diagnosis not present

## 2020-08-18 NOTE — Progress Notes (Signed)
Remote ICD transmission.   

## 2020-08-22 NOTE — Progress Notes (Signed)
EPIC Encounter for ICM Monitoring  Patient Name: [name] is a 69 y.o. female Date: 08/22/2020 Primary Care Physican: Christina Spar, MD Primary Cardiologist:[name] Electrophysiologist:[name] Bi-V Pacing:99% 9/22/2021Weight:246lbs   Transmission reviewed.    CorvueThoracic impedancenormal.   Prescribed: Furosemide20 mg take 1 tablet by mouth dailyand per Dr [name] 10/08/19 OV note, she may take 40 mg as needed.  Labs: 03/14/2020 Creatinine1.32, BUN33, Potassium5.5, Sodium135, [id number] A complete set of results can be found in Results Review.  Recommendations: No changes.  Follow-up plan: ICM clinic phone appointment on1/12/2020. 91 day device clinic remote transmission2/21/2022.   EP/Cardiology Office Visits:11/06/2020 with Dr.Branch.   Copy of ICM check sent to Dr.Allred.    3 month ICM trend: 08/18/2020    1 Year ICM trend:       Christina Billings, RN 08/22/2020 10:51 AM

## 2020-08-23 DIAGNOSIS — G4733 Obstructive sleep apnea (adult) (pediatric): Secondary | ICD-10-CM | POA: Diagnosis not present

## 2020-08-26 ENCOUNTER — Other Ambulatory Visit: Payer: Self-pay | Admitting: Family Medicine

## 2020-08-28 ENCOUNTER — Telehealth (INDEPENDENT_AMBULATORY_CARE_PROVIDER_SITE_OTHER): Payer: Medicare HMO | Admitting: Internal Medicine

## 2020-08-28 ENCOUNTER — Other Ambulatory Visit: Payer: Self-pay

## 2020-08-28 ENCOUNTER — Encounter: Payer: Self-pay | Admitting: Internal Medicine

## 2020-08-28 VITALS — Ht 65.0 in | Wt 242.0 lb

## 2020-08-28 DIAGNOSIS — Z0100 Encounter for examination of eyes and vision without abnormal findings: Secondary | ICD-10-CM

## 2020-08-28 DIAGNOSIS — Z0000 Encounter for general adult medical examination without abnormal findings: Secondary | ICD-10-CM | POA: Diagnosis not present

## 2020-08-28 NOTE — Progress Notes (Addendum)
Subjective:   [name] is a 69 y.o. female who presents for Medicare Annual (Subsequent) preventive examination.  Review of Systems     Cardiac Risk Factors include: advanced age (>66men, >58 women);diabetes mellitus;hypertension     Objective:    Today's Vitals   08/28/20 0833 08/28/20 0835  Weight: 242 lb (109.8 kg)   Height: 5\' 5"  (1.651 m)   PainSc: 0-No pain 0-No pain   Body mass index is 40.27 kg/m.  Advanced Directives 08/28/2020 05/03/2019 07/17/2017 10/14/2016 07/19/2016 05/28/2015 01/01/2015  Does Patient Have a Medical Advance Directive? No No No No No No No  Would patient like information on creating a medical advance directive? Yes (MAU/Ambulatory/Procedural Areas - Information given) Yes (MAU/Ambulatory/Procedural Areas - Information given) No - Patient declined No - Patient declined - No - patient declined information No - patient declined information  Pre-existing out of facility DNR order (yellow form or pink MOST form) - - - - - - -    Current Medications (verified) Outpatient Encounter Medications as of 08/28/2020  Medication Sig  . acetaminophen (TYLENOL) 500 MG tablet Take 1,000 mg by mouth every 6 (six) hours as needed for moderate pain.  Marland Kitchen aspirin 81 MG chewable tablet Chew 1 tablet (81 mg total) by mouth daily.  Marland Kitchen atorvastatin (LIPITOR) 80 MG tablet TAKE 1 TABLET EVERY DAY  . carvedilol (COREG) 25 MG tablet TAKE 1 TABLET TWICE DAILY  . Ciclopirox 0.77 % gel APPLY ONCE DAILY ON THE AFFECTED TOE NAIL.  . furosemide (LASIX) 20 MG tablet TAKE 1 TABLET EVERY DAY  . LANTUS 100 UNIT/ML injection INJECT 40 UNITS SUBCUTANEOUSLY TWICE DAILY  . levothyroxine (SYNTHROID) 25 MCG tablet TAKE 1 TABLET EVERY DAY BEFORE BREAKFAST  . levothyroxine (SYNTHROID) 50 MCG tablet Take 50 mcg by mouth daily before breakfast.  . losartan (COZAAR) 25 MG tablet TAKE 1 TABLET EVERY DAY  . Multiple Vitamin (MULTIVITAMIN) tablet Take 1 tablet by mouth daily.  Marland Kitchen NOVOLOG 100  UNIT/ML injection INJECT 10 UNITS SUBCUTANEOUSLY THREE TIMES DAILY WITH MEALS (VIAL EXPIRES 28 DAYS AFTER OPENING)  . omega-3 fish oil (MAXEPA) 1000 MG CAPS capsule Take 1 capsule by mouth daily.  . polyethylene glycol (MIRALAX / GLYCOLAX) packet Take 17 g by mouth daily as needed for mild constipation.   Marland Kitchen spironolactone (ALDACTONE) 25 MG tablet TAKE 1 TABLET EVERY DAY   No facility-administered encounter medications on file as of 08/28/2020.    Allergies (verified) Other and Lisinopril   History: Past Medical History:  Diagnosis Date  . Anemia   . Arthritis   . CAD (coronary artery disease)    a. LHC (03/2014) Lmain: nl, LAD: diff dz proximal 20%, 30-40% dz mid vessel prior 2nd diagonal, LCx: 40% in OM1, 20-30% in OM2, RCA: 99% subtotal occlusion @ crux, TIMI 1 flow, L-R collaterals to distal vessel  . Chronic systolic heart failure ([place])    a. ECHO (03/2014): EF 25-30%, akinesis enteroanteroseptal myocardium, grade III DD b. RHC (03/2014) RA 4, RV 42/4, PA 44/14 (26), PCWP 21, PA 62% Fick CO/CI 6.9 / 3.4  . Diabetes mellitus   . Duodenal ulcer    Age 65  . Erosive esophagitis   . Gastritis   . Hypertension   . Ischemic cardiomyopathy   . Obese   . Short-term memory loss   . Thyroid disease   . TTP (thrombotic thrombocytopenic purpura)    Past Surgical History:  Procedure Laterality Date  . BIV ICD INSERTION CRT-D N/A  05/03/2019   Procedure: BIV ICD INSERTION CRT-D;  Surgeon: Thompson Grayer, MD;  Location: [facility];  Service: Cardiovascular;  Laterality: N/A;  . COLONOSCOPY  08/29/2012   GYJ:EHUDJSH diverticulosis  . COLONOSCOPY N/A 05/28/2015   Procedure: COLONOSCOPY;  Surgeon: Daneil Dolin, MD;  Location: [facility];  Service: Endoscopy;  Laterality: N/A;  1015  . ECTOPIC PREGNANCY SURGERY    . ESOPHAGOGASTRODUODENOSCOPY  04/20/2012   FWY:OVZCHYI antral gastritis/Erosive reflux esophagitis  . LEFT AND RIGHT HEART CATHETERIZATION WITH CORONARY ANGIOGRAM N/A 03/28/2014    Procedure: LEFT AND RIGHT HEART CATHETERIZATION WITH CORONARY ANGIOGRAM;  Surgeon: Peter M Martinique, MD;  Location: [facility];  Service: Cardiovascular;  Laterality: N/A;  . SPLENECTOMY, PARTIAL     Family History  Problem Relation Age of Onset  . Heart failure Mother   . Cirrhosis Father        ETOH  . Diabetes Daughter   . Breast cancer Maternal Aunt   . Colon cancer Neg Hx    Social History   Socioeconomic History  . Marital status: Divorced    Spouse name: Not on file  . Number of children: 3  . Years of education: Not on file  . Highest education level: Not on file  Occupational History  . Occupation: disabled    Fish farm manager: NOT EMPLOYED  Tobacco Use  . Smoking status: Former Smoker    Packs/day: 0.25    Years: 10.00    Pack years: 2.50    Types: Cigarettes    Start date: 09/20/1966    Quit date: 09/20/1978    Years since quitting: 41.9  . Smokeless tobacco: Never Used  Vaping Use  . Vaping Use: Never used  Substance and Sexual Activity  . Alcohol use: No    Alcohol/week: 0.0 standard drinks  . Drug use: No  . Sexual activity: Not on file  Other Topics Concern  . Not on file  Social History Narrative   Lives Alone       Social Determinants of Health   Financial Resource Strain: Low Risk   . Difficulty of Paying Living Expenses: Not hard at all  Food Insecurity: No Food Insecurity  . Worried About Charity fundraiser in the Last Year: Never true  . Ran Out of Food in the Last Year: Never true  Transportation Needs: No Transportation Needs  . Lack of Transportation (Medical): No  . Lack of Transportation (Non-Medical): No  Physical Activity: Insufficiently Active  . Days of Exercise per Week: 2 days  . Minutes of Exercise per Session: 20 min  Stress: No Stress Concern Present  . Feeling of Stress : Not at all  Social Connections: Socially Isolated  . Frequency of Communication with Friends and Family: Never  . Frequency of Social Gatherings with Friends  and Family: Never  . Attends Religious Services: More than 4 times per year  . Active Member of Clubs or Organizations: No  . Attends Archivist Meetings: Never  . Marital Status: Divorced    Tobacco Counseling Counseling given: Not Answered   Clinical Intake:  Pre-visit preparation completed: Yes  Pain : No/denies pain Pain Score: 0-No pain     BMI - recorded: 40.27 Nutritional Status: BMI > 30  Obese Nutritional Risks: None Diabetes: Yes CBG done?: No Did pt. bring in CBG monitor from home?: No  How often do you need to have someone help you when you read instructions, pamphlets, or other written materials from  your doctor or pharmacy?: 1 - Never What is the last grade level you completed in school?: 12th  Diabetic?yes  Interpreter Needed?: No      Activities of Daily Living In your present state of health, do you have any difficulty performing the following activities: 08/28/2020 07/09/2020  Hearing? N N  Vision? N N  Difficulty concentrating or making decisions? N N  Walking or climbing stairs? Y N  Dressing or bathing? N N  Doing errands, shopping? N N  Preparing Food and eating ? N -  Using the Toilet? N -  In the past six months, have you accidently leaked urine? N -  Do you have problems with loss of bowel control? N -  Managing your Medications? N -  Managing your Finances? N -  Housekeeping or managing your Housekeeping? N -  Some recent data might be hidden    Patient Care Team: Lindell Spar, MD as PCP - General (Internal Medicine) Harl Bowie Alphonse Guild, MD as PCP - Cardiology (Cardiology) Gala Romney Cristopher Estimable, MD as Attending Physician (Gastroenterology)  Indicate any recent Medical Services you may have received from other than [facility] providers in the past year (date may be approximate).     Assessment:   This is a routine wellness examination for [name].  Hearing/Vision screen No exam data present  Dietary issues and exercise  activities discussed: Current Exercise Habits: Home exercise routine, Time (Minutes): 20, Frequency (Times/Week): 2, Weekly Exercise (Minutes/Week): 40, Intensity: Mild, Exercise limited by: None identified  Goals    . Weight (lb) < 200 lb (90.7 kg)     Would like to lose 50 to 75lbs       Depression Screen PHQ 2/9 Scores 08/28/2020 08/28/2020 07/09/2020 05/01/2020 03/07/2020 03/07/2020 02/17/2017  PHQ - 2 Score 0 0 0 0 0 0 0  PHQ- 9 Score - - - - 1 - 2  Exception Documentation - - - - - - -    Fall Risk Fall Risk  08/28/2020 07/09/2020 05/01/2020 03/07/2020 10/14/2016  Falls in the past year? 0 0 0 1 No  Number falls in past yr: 0 0 0 1 -  Injury with Fall? 0 0 0 0 -  Risk for fall due to : No Fall Risks No Fall Risks No Fall Risks - (No Data)  Risk for fall due to: Comment - - - - No history of falls  Follow up Falls evaluation completed Falls evaluation completed Falls evaluation completed - -    FALL RISK PREVENTION PERTAINING TO THE HOME:  Any stairs in or around the home? No  If so, are there any without handrails? No  Home free of loose throw rugs in walkways, pet beds, electrical cords, etc? Yes  Adequate lighting in your home to reduce risk of falls? Yes   ASSISTIVE DEVICES UTILIZED TO PREVENT FALLS:  Life alert? No  Use of a cane, walker or w/c? Yes  Grab bars in the bathroom? Yes  Shower chair or bench in shower? No  Elevated toilet seat or a handicapped toilet? Yes   TIMED UP AND GO:  Was the test performed? No .  Length of time to ambulate 10 feet: NA sec.     Cognitive Function: MMSE - Mini Mental State Exam 08/28/2020  Not completed: Unable to complete     6CIT Screen 08/28/2020  What Year? 0 points  What month? 0 points  What time? 0 points  Count back from 20 0 points  Months  in reverse 0 points  Repeat phrase 0 points  Total Score 0    Immunizations Immunization History  Administered Date(s) Administered  . Fluad Quad(high Dose 65+) 06/19/2020   . Influenza,inj,Quad PF,6+ Mos 06/22/2017  . Influenza-Unspecified 06/20/2014, 07/21/2018  . Moderna SARS-COVID-2 Vaccination 12/01/2019, 01/02/2020  . Pneumococcal Polysaccharide-23 04/19/2012    TDAP status: Due, Education has been provided regarding the importance of this vaccine. Advised may receive this vaccine at local pharmacy or Health Dept. Aware to provide a copy of the vaccination record if obtained from local pharmacy or Health Dept. Verbalized acceptance and understanding.  Flu Vaccine status: Up to date  Pneumococcal vaccine status: Declined,  Education has been provided regarding the importance of this vaccine but patient still declined. Advised may receive this vaccine at local pharmacy or Health Dept. Aware to provide a copy of the vaccination record if obtained from local pharmacy or Health Dept. Verbalized acceptance and understanding.   Covid-19 vaccine status: Completed vaccines  Qualifies for Shingles Vaccine? No   Zostavax completed No   Shingrix Completed?: No.    Education has been provided regarding the importance of this vaccine. Patient has been advised to call insurance company to determine out of pocket expense if they have not yet received this vaccine. Advised may also receive vaccine at local pharmacy or Health Dept. Verbalized acceptance and understanding.  Screening Tests Health Maintenance  Topic Date Due  . OPHTHALMOLOGY EXAM  Never done  . TETANUS/TDAP  Never done  . DEXA SCAN  Never done  . PNA vac Low Risk Adult (1 of 2 - PCV13) 11/20/2013  . COVID-19 Vaccine (3 - Booster for Moderna series) 07/03/2020  . HEMOGLOBIN A1C  01/07/2021  . FOOT EXAM  03/07/2021  . MAMMOGRAM  03/14/2022  . COLONOSCOPY  05/27/2025  . INFLUENZA VACCINE  Completed  . Hepatitis C Screening  Completed    Health Maintenance  Health Maintenance Due  Topic Date Due  . OPHTHALMOLOGY EXAM  Never done  . TETANUS/TDAP  Never done  . DEXA SCAN  Never done  . PNA vac Low  Risk Adult (1 of 2 - PCV13) 11/20/2013  . COVID-19 Vaccine (3 - Booster for Moderna series) 07/03/2020    Colorectal cancer screening: Type of screening: Colonoscopy. Completed 05-28-15. Repeat every 10 years  Mammogram status: Completed 03-14-20. Repeat every year  Bone Density status: Ordered at [place]. Pt provided with contact info and advised to call to schedule appt.  Lung Cancer Screening: (Low Dose CT Chest recommended if Age 43-80 years, 30 pack-year currently smoking OR have quit w/in 15years.) does not qualify.   Lung Cancer Screening Referral: no  Additional Screening:  Hepatitis C Screening: does not qualify; Completed   Vision Screening: Recommended annual ophthalmology exams for early detection of glaucoma and other disorders of the eye. Is the patient up to date with their annual eye exam?  No  Who is the provider or what is the name of the office in which the patient attends annual eye exams? Does not have one If pt is not established with a provider, would they like to be referred to a provider to establish care? Yes .   Dental Screening: Recommended annual dental exams for proper oral hygiene  Community Resource Referral / Chronic Care Management: CRR required this visit?  No   CCM required this visit?  No      Plan:     I have personally reviewed and noted the following in the patient's  chart:   . Medical and social history . Use of alcohol, tobacco or illicit drugs  . Current medications and supplements . Functional ability and status . Nutritional status . Physical activity . Advanced directives . List of other physicians . Hospitalizations, surgeries, and ER visits in previous 12 months . Vitals . Screenings to include cognitive, depression, and falls . Referrals and appointments  In addition, I have reviewed and discussed with patient certain preventive protocols, quality metrics, and best practice recommendations. A written personalized care plan  for preventive services as well as general preventive health recommendations were provided to patient.     Shelda Altes, CMA   08/28/2020   Nurse Notes:     Zacarias Pontes - Screening questionnaire Ihor Dow, MD - Reviewed question responses and discussed additional healthcare related needs with the patient.  Location of Patient: Home Location of Provider: Office Consent was obtain for visit to be over via telehealth - phone. I verified that I am speaking with the correct person using two identifiers.   I connected with  [name] on 08/28/20 via telephone and verified that I am speaking with the correct person using two identifiers.  I discussed the limitations of evaluation and management by telemedicine. The patient expressed understanding and agreed to proceed.    I have reviewed the details of the assessment and plan of the wellness visit and agree to it. Additional questions and concerns were addressed in detail.

## 2020-09-17 ENCOUNTER — Other Ambulatory Visit: Payer: Self-pay | Admitting: Family Medicine

## 2020-09-23 ENCOUNTER — Ambulatory Visit (INDEPENDENT_AMBULATORY_CARE_PROVIDER_SITE_OTHER): Payer: Medicare HMO

## 2020-09-23 DIAGNOSIS — I5022 Chronic systolic (congestive) heart failure: Secondary | ICD-10-CM

## 2020-09-23 DIAGNOSIS — Z95810 Presence of automatic (implantable) cardiac defibrillator: Secondary | ICD-10-CM

## 2020-09-23 DIAGNOSIS — G4733 Obstructive sleep apnea (adult) (pediatric): Secondary | ICD-10-CM | POA: Diagnosis not present

## 2020-09-26 NOTE — Progress Notes (Signed)
EPIC Encounter for ICM Monitoring  Patient Name: [name] is a 70 y.o. female Date: 09/26/2020 Primary Care Physican: Lindell Spar, MD Primary Cardiologist:Branch Electrophysiologist:Allred Bi-V Pacing:99% 1/7/2022Weight:246lbs   Spoke with patient and reports she has had a cold/congestion for the past couple of weeks.  She is not sure if she had COVID but feels better today.  Ankles were swollen during decreased impedance.   CorvueThoracic impedancenormal.   Prescribed: Furosemide20 mg take 1 tablet by mouth dailyand per Dr Nelly Laurence 10/08/19 OV note, she may take 40 mg as needed.  Labs: 03/14/2020 Creatinine1.32, BUN33, Potassium5.5, Sodium135, [id number] A complete set of results can be found in Results Review.  Recommendations: No changes.  Follow-up plan: ICM clinic phone appointment on2/14/2022. 91 day device clinic remote transmission2/21/2022.   EP/Cardiology Office Visits:2/17/2022with Dr.Branch.   Copy of ICM check sent to Dr.Allred.    3 month ICM trend: 09/25/2020.    1 Year ICM trend:       [name], RN 09/26/2020 3:32 PM

## 2020-10-06 ENCOUNTER — Encounter: Payer: Self-pay | Admitting: Internal Medicine

## 2020-10-06 ENCOUNTER — Other Ambulatory Visit: Payer: Self-pay

## 2020-10-06 ENCOUNTER — Telehealth (INDEPENDENT_AMBULATORY_CARE_PROVIDER_SITE_OTHER): Payer: Medicare HMO | Admitting: Internal Medicine

## 2020-10-06 DIAGNOSIS — E039 Hypothyroidism, unspecified: Secondary | ICD-10-CM

## 2020-10-06 DIAGNOSIS — E119 Type 2 diabetes mellitus without complications: Secondary | ICD-10-CM | POA: Diagnosis not present

## 2020-10-06 DIAGNOSIS — I10 Essential (primary) hypertension: Secondary | ICD-10-CM | POA: Diagnosis not present

## 2020-10-06 DIAGNOSIS — G4733 Obstructive sleep apnea (adult) (pediatric): Secondary | ICD-10-CM | POA: Diagnosis not present

## 2020-10-06 DIAGNOSIS — Z794 Long term (current) use of insulin: Secondary | ICD-10-CM

## 2020-10-06 DIAGNOSIS — R059 Cough, unspecified: Secondary | ICD-10-CM | POA: Diagnosis not present

## 2020-10-06 MED ORDER — BLOOD GLUCOSE METER KIT: PACK | 0 refills | Status: DC

## 2020-10-06 NOTE — Patient Instructions (Signed)
Please get fasting blood test done in the next week.  Please continue to take medications as prescribed.  Please continue to check blood glucose and blood pressure at home.  Okay to take Robitussin or Mucinex for cough.  If you have persistent cough after 2 weeks, please contact us.

## 2020-10-06 NOTE — Assessment & Plan Note (Signed)
HbA1C: 7.8 On Lantus 48 U BID and Novolog 12 U TID with meals Advised to follow diabetic diet On statin F/u CMP and lipid panel Diabetic eye exam: Advised to follow up with Ophthalmology for diabetic eye exam

## 2020-10-06 NOTE — Assessment & Plan Note (Signed)
Well-controlled with Losartan, Coreg, Spironolactone and Lasix 

## 2020-10-06 NOTE — Assessment & Plan Note (Signed)
Uses CPAP regularly °

## 2020-10-06 NOTE — Progress Notes (Signed)
Virtual Visit via Telephone Note   This visit type was conducted due to national recommendations for restrictions regarding the COVID-19 Pandemic (e.g. social distancing) in an effort to limit this patient's exposure and mitigate transmission in our community.  Due to her co-morbid illnesses, this patient is at least at moderate risk for complications without adequate follow up.  This format is felt to be most appropriate for this patient at this time.  The patient did not have access to video technology/had technical difficulties with video requiring transitioning to audio format only (telephone).  All issues noted in this document were discussed and addressed.  No physical exam could be performed with this format.  Evaluation Performed:  Follow-up visit  Date:  10/06/2020   ID:  [name], DOB July 25, 1951, MRN [id number]  Patient Location: Home Provider Location: Home Office  Participants: Patient Location of Patient: Home Location of Provider: Telehealth Consent was obtain for visit to be over via telehealth. I verified that I am speaking with the correct person using two identifiers.  PCP:  Lindell Spar, MD   Chief Complaint:  Follow up of chronic medical conditions  History of Present Illness:    [name] is a 70 y.o. female with PMH of chronic combined systolic and diastolic CHF with ejection fraction of 30 to 35%, ischemic cardiomyopathy status post ICD placement, hypertension, hyperlipidemia, type 2 diabetes mellitus, obstructive sleep apnea on BiPAP at night, obesity, coronary artery disease, GERD and hypothyroidism who has a televisit for follow-up of her chronic medical conditions.  She has been doing well overall.  Her blood pressure has been stable at home. Takes medications regularly. Patient denies headache, dizziness, chest pain, dyspnea or palpitations.  She checks her blood glucose regularly.  She takes insulin regularly.  She requests a prescription  for glucometer and supplies as her previous glucometer has not been working properly.   She uses CPAP at night for OSA.  She recently had upper respiratory tract infection, and states that she has been feeling better now.  She denies any fever, chills, nasal congestion, sinus pain or sore throat.  She still has dry cough, but has not taken any medications for it as she was worried about her blood pressure and diabetes.  The patient does not have symptoms concerning for COVID-19 infection (fever, chills, cough, or new shortness of breath).   Past Medical, Surgical, Social History, Allergies, and Medications have been Reviewed.  Past Medical History:  Diagnosis Date  . Anemia   . Arthritis   . CAD (coronary artery disease)    a. LHC (03/2014) [place]: nl, LAD: diff dz proximal 20%, 30-40% dz mid vessel prior 2nd diagonal, LCx: 40% in OM1, 20-30% in OM2, RCA: 99% subtotal occlusion @ crux, TIMI 1 flow, L-R collaterals to distal vessel  . CHF (congestive heart failure) ([place])    Phreesia 10/03/2020  . Chronic systolic heart failure ([place])    a. ECHO (03/2014): EF 25-30%, akinesis enteroanteroseptal myocardium, grade III DD b. RHC (03/2014) RA 4, RV 42/4, PA 44/14 (26), PCWP 21, PA 62% Fick CO/CI 6.9 / 3.4  . Diabetes mellitus   . Diabetes mellitus without complication ([place])    Phreesia 10/03/2020  . Duodenal ulcer    Age 49  . Erosive esophagitis   . Gastritis   . Hypertension   . Ischemic cardiomyopathy   . Obese   . Short-term memory loss   . Thyroid disease   . TTP (thrombotic  thrombocytopenic purpura)    Past Surgical History:  Procedure Laterality Date  . BIV ICD INSERTION CRT-D N/A 05/03/2019   Procedure: BIV ICD INSERTION CRT-D;  Surgeon: Thompson Grayer, MD;  Location: [facility];  Service: Cardiovascular;  Laterality: N/A;  . COLONOSCOPY  08/29/2012   MBE:[id number] diverticulosis  . COLONOSCOPY N/A 05/28/2015   Procedure: COLONOSCOPY;  Surgeon: Daneil Dolin, MD;  [facility];  Service: Endoscopy;  Laterality: N/A;  1015  . ECTOPIC PREGNANCY SURGERY    . ESOPHAGOGASTRODUODENOSCOPY  04/20/2012   EFE:[id number] antral gastritis/Erosive reflux esophagitis  . LEFT AND RIGHT HEART CATHETERIZATION WITH CORONARY ANGIOGRAM N/A 03/28/2014   Procedure: LEFT AND RIGHT HEART CATHETERIZATION WITH CORONARY ANGIOGRAM;  Surgeon: Peter M Martinique, MD;  Location: [facility] CATH LAB;  Service: Cardiovascular;  Laterality: N/A;  . SPLENECTOMY, PARTIAL       Current Meds  Medication Sig  . acetaminophen (TYLENOL) 500 MG tablet Take 1,000 mg by mouth every 6 (six) hours as needed for moderate pain.  Marland Kitchen aspirin 81 MG chewable tablet Chew 1 tablet (81 mg total) by mouth daily.  Marland Kitchen atorvastatin (LIPITOR) 80 MG tablet TAKE 1 TABLET EVERY DAY  . blood glucose meter kit and supplies Dispense based on patient and insurance preference. Use up to four times daily as directed. (FOR ICD-10 E10.9, E11.9).  . carvedilol (COREG) 25 MG tablet TAKE 1 TABLET TWICE DAILY  . Ciclopirox 0.77 % gel APPLY ONCE DAILY ON THE AFFECTED TOE NAIL.  . furosemide (LASIX) 20 MG tablet TAKE 1 TABLET EVERY DAY  . LANTUS 100 UNIT/ML injection INJECT 40 UNITS SUBCUTANEOUSLY TWICE DAILY  . levothyroxine (SYNTHROID) 50 MCG tablet Take 50 mcg by mouth daily before breakfast.  . losartan (COZAAR) 25 MG tablet TAKE 1 TABLET EVERY DAY  . Multiple Vitamin (MULTIVITAMIN) tablet Take 1 tablet by mouth daily.  Marland Kitchen NOVOLOG 100 UNIT/ML injection INJECT 10 UNITS SUBCUTANEOUSLY THREE TIMES DAILY WITH MEALS (VIAL EXPIRES 28 DAYS AFTER OPENING)  . omega-3 fish oil (MAXEPA) 1000 MG CAPS capsule Take 1 capsule by mouth daily.  . polyethylene glycol (MIRALAX / GLYCOLAX) packet Take 17 g by mouth daily as needed for mild constipation.   Marland Kitchen spironolactone (ALDACTONE) 25 MG tablet TAKE 1 TABLET EVERY DAY  . [DISCONTINUED] levothyroxine (SYNTHROID) 25 MCG tablet TAKE 1 TABLET EVERY DAY BEFORE BREAKFAST     Allergies:   Other and Lisinopril    ROS:   Please see the history of present illness.    Review of Systems  Constitutional: Negative for chills and fever.  HENT: Negative for congestion, ear pain, sinus pain and sore throat.   Eyes: Negative for pain and redness.  Respiratory: Positive for cough. Negative for shortness of breath.   Cardiovascular: Negative for chest pain and palpitations.  Gastrointestinal: Negative for constipation, diarrhea, nausea and vomiting.  Genitourinary: Negative for dysuria and hematuria.  Neurological: Negative for dizziness and focal weakness.    Labs/Other Tests and Data Reviewed:    Recent Labs: 03/14/2020: ALT 30; BUN 33; Creat 1.32; Hemoglobin 11.3; Platelets 199; Potassium 5.5; Sodium 135; TSH 5.62   Recent Lipid Panel Lab Results  Component Value Date/Time   CHOL 76 03/14/2020 03:24 PM   TRIG 75 03/14/2020 03:24 PM   HDL 33 (L) 03/14/2020 03:24 PM   CHOLHDL 2.3 03/14/2020 03:24 PM   LDLCALC 27 03/14/2020 03:24 PM    Wt Readings from Last 3 Encounters:  08/28/20 242 lb (109.8 kg)  07/09/20 246 lb  6.4 oz (111.8 kg)  06/19/20 243 lb (110.2 kg)      ASSESSMENT & PLAN:    Type 2 diabetes mellitus without complication, with long-term current use of insulin (HCC) HbA1C: 7.8 On Lantus 48 U BID and Novolog 12 U TID with meals Advised to follow diabetic diet On statin F/u CMP and lipid panel Diabetic eye exam: Advised to follow up with Ophthalmology for diabetic eye exam   Hypothyroidism On Levothyroxine 50 mcg QD Follow up TSH and free T4  HTN (hypertension) Well-controlled with Losartan, Coreg, Spironolactone and Lasix  OSA (obstructive sleep apnea) Uses CPAP regularly  Cough Persistent cough from URTI, likely postinfectious Mucinex PRN Advised to contact if symptoms persistent after 2 weeks  Time:   Today, I have spent 27 minutes reviewing the chart, including problem list, medications, and with the patient with telehealth technology discussing the above  problems.   Medication Adjustments/Labs and Tests Ordered: Current medicines are reviewed at length with the patient today.  Concerns regarding medicines are outlined above.   Tests Ordered: Orders Placed This Encounter  Procedures  . CBC with Differential  . CMP14+EGFR  . Hemoglobin A1c  . TSH + free T4  . Vitamin D (25 hydroxy)  . Lipid panel    Medication Changes: Meds ordered this encounter  Medications  . blood glucose meter kit and supplies    Sig: Dispense based on patient and insurance preference. Use up to four times daily as directed. (FOR ICD-10 E10.9, E11.9).    Dispense:  1 each    Refill:  0    Order Specific Question:   Number of strips    Answer:   100    Order Specific Question:   Number of lancets    Answer:   100     Note: This dictation was prepared with Dragon dictation along with smaller phrase technology. Similar sounding words can be transcribed inadequately or may not be corrected upon review. Any transcriptional errors that result from this process are unintentional.      Disposition:  Follow up  Signed, Lindell Spar, MD  10/06/2020 2:37 PM     [place]

## 2020-10-06 NOTE — Assessment & Plan Note (Signed)
On Levothyroxine 50 mcg QD Follow up TSH and free T4 

## 2020-10-09 ENCOUNTER — Ambulatory Visit: Payer: Medicare HMO | Admitting: Internal Medicine

## 2020-10-10 ENCOUNTER — Other Ambulatory Visit: Payer: Self-pay | Admitting: Family Medicine

## 2020-10-16 ENCOUNTER — Other Ambulatory Visit: Payer: Self-pay | Admitting: Family Medicine

## 2020-10-22 ENCOUNTER — Other Ambulatory Visit: Payer: Self-pay | Admitting: Cardiology

## 2020-10-23 ENCOUNTER — Other Ambulatory Visit: Payer: Self-pay

## 2020-10-23 ENCOUNTER — Telehealth: Payer: Self-pay

## 2020-10-23 DIAGNOSIS — E119 Type 2 diabetes mellitus without complications: Secondary | ICD-10-CM

## 2020-10-23 DIAGNOSIS — Z794 Long term (current) use of insulin: Secondary | ICD-10-CM

## 2020-10-23 MED ORDER — BLOOD GLUCOSE METER KIT: PACK | 0 refills | Status: DC

## 2020-10-23 NOTE — Telephone Encounter (Signed)
Can you please find out what kind of meter she has and what exactly she needs?

## 2020-10-23 NOTE — Telephone Encounter (Signed)
Please send in diabetic supplies to the pharmacy -

## 2020-10-24 DIAGNOSIS — G4733 Obstructive sleep apnea (adult) (pediatric): Secondary | ICD-10-CM | POA: Diagnosis not present

## 2020-10-28 ENCOUNTER — Other Ambulatory Visit: Payer: Self-pay | Admitting: *Deleted

## 2020-10-28 MED ORDER — BLOOD GLUCOSE METER KIT: PACK | 0 refills | Status: DC

## 2020-10-28 NOTE — Telephone Encounter (Signed)
She said please send in for all new- whatever her insurance will pay for

## 2020-10-28 NOTE — Telephone Encounter (Signed)
Pt kit sent to [facility]

## 2020-11-03 ENCOUNTER — Ambulatory Visit (INDEPENDENT_AMBULATORY_CARE_PROVIDER_SITE_OTHER): Payer: Medicare HMO

## 2020-11-03 DIAGNOSIS — I5022 Chronic systolic (congestive) heart failure: Secondary | ICD-10-CM

## 2020-11-03 DIAGNOSIS — Z95810 Presence of automatic (implantable) cardiac defibrillator: Secondary | ICD-10-CM

## 2020-11-03 NOTE — Progress Notes (Signed)
EPIC Encounter for ICM Monitoring  Patient Name: [name] is a 70 y.o. female Date: 11/03/2020 Primary Care Physican: Lindell Spar, MD Primary Cardiologist:Branch Electrophysiologist:Allred Bi-V Pacing:98% 1/7/2022Weight:246lbs   Transmission reviewed.  CorvueThoracic impedancenormal but slight trend downward suggesting the start of possible fluid accumulation on transmission date.   Prescribed: Furosemide20 mg take 1 tablet by mouth dailyand per Dr Nelly Laurence 10/08/19 OV note, she may take 40 mg as needed.  Labs: 03/14/2020 Creatinine1.32, BUN33, Potassium5.5, Sodium135, [id number] A complete set of results can be found in Results Review.  Recommendations: No changes.  Follow-up plan: ICM clinic phone appointment on3/21/2022. 91 day device clinic remote transmission2/21/2022.   EP/Cardiology Office Visits:2/17/2022with Dr.Branch.   Copy of ICM check sent to Dr.Allred.   3 month ICM trend: 11/03/2020.    1 Year ICM trend:       [name], RN 11/03/2020 5:12 PM

## 2020-11-04 ENCOUNTER — Telehealth: Payer: Self-pay | Admitting: Cardiology

## 2020-11-04 NOTE — Telephone Encounter (Signed)
  Patient Consent for Virtual Visit         [name] has provided verbal consent on 11/04/2020 for a virtual visit (video or telephone).   CONSENT FOR VIRTUAL VISIT FOR:  [name]  By participating in this virtual visit I agree to the following:  I hereby voluntarily request, consent and authorize [facility] and its employed or contracted physicians, physician assistants, nurse practitioners or other licensed health care professionals (the Practitioner), to provide me with telemedicine health care services (the "Services") as deemed necessary by the treating Practitioner. I acknowledge and consent to receive the Services by the Practitioner via telemedicine. I understand that the telemedicine visit will involve communicating with the Practitioner through live audiovisual communication technology and the disclosure of certain medical information by electronic transmission. I acknowledge that I have been given the opportunity to request an in-person assessment or other available alternative prior to the telemedicine visit and am voluntarily participating in the telemedicine visit.  I understand that I have the right to withhold or withdraw my consent to the use of telemedicine in the course of my care at any time, without affecting my right to future care or treatment, and that the Practitioner or I may terminate the telemedicine visit at any time. I understand that I have the right to inspect all information obtained and/or recorded in the course of the telemedicine visit and may receive copies of available information for a reasonable fee.  I understand that some of the potential risks of receiving the Services via telemedicine include:  Marland Kitchen Delay or interruption in medical evaluation due to technological equipment failure or disruption; . Information transmitted may not be sufficient (e.g. poor resolution of images) to allow for appropriate medical decision making by the Practitioner;  and/or  . In rare instances, security protocols could fail, causing a breach of personal health information.  Furthermore, I acknowledge that it is my responsibility to provide information about my medical history, conditions and care that is complete and accurate to the best of my ability. I acknowledge that Practitioner's advice, recommendations, and/or decision may be based on factors not within their control, such as incomplete or inaccurate data provided by me or distortions of diagnostic images or specimens that may result from electronic transmissions. I understand that the practice of medicine is not an exact science and that Practitioner makes no warranties or guarantees regarding treatment outcomes. I acknowledge that a copy of this consent can be made available to me via my patient portal ([facility]), or I can request a printed copy by calling the office of [facility].    I understand that my insurance will be billed for this visit.   I have read or had this consent read to me. . I understand the contents of this consent, which adequately explains the benefits and risks of the Services being provided via telemedicine.  . I have been provided ample opportunity to ask questions regarding this consent and the Services and have had my questions answered to my satisfaction. . I give my informed consent for the services to be provided through the use of telemedicine in my medical care

## 2020-11-06 ENCOUNTER — Encounter: Payer: Self-pay | Admitting: Cardiology

## 2020-11-06 ENCOUNTER — Telehealth (INDEPENDENT_AMBULATORY_CARE_PROVIDER_SITE_OTHER): Payer: Medicare HMO | Admitting: Cardiology

## 2020-11-06 VITALS — BP 124/74 | HR 78 | Ht 65.0 in | Wt 243.0 lb

## 2020-11-06 DIAGNOSIS — E782 Mixed hyperlipidemia: Secondary | ICD-10-CM | POA: Diagnosis not present

## 2020-11-06 DIAGNOSIS — I2510 Atherosclerotic heart disease of native coronary artery without angina pectoris: Secondary | ICD-10-CM | POA: Diagnosis not present

## 2020-11-06 DIAGNOSIS — G4733 Obstructive sleep apnea (adult) (pediatric): Secondary | ICD-10-CM

## 2020-11-06 DIAGNOSIS — I5022 Chronic systolic (congestive) heart failure: Secondary | ICD-10-CM

## 2020-11-06 NOTE — Addendum Note (Signed)
Addended by: Julian Hy T on: 11/06/2020 11:44 AM   Modules accepted: Orders

## 2020-11-06 NOTE — Progress Notes (Signed)
Virtual Visit via Telephone Note   This visit type was conducted due to national recommendations for restrictions regarding the COVID-19 Pandemic (e.g. social distancing) in an effort to limit this patient's exposure and mitigate transmission in our community.  Due to her co-morbid illnesses, this patient is at least at moderate risk for complications without adequate follow up.  This format is felt to be most appropriate for this patient at this time.  The patient did not have access to video technology/had technical difficulties with video requiring transitioning to audio format only (telephone).  All issues noted in this document were discussed and addressed.  No physical exam could be performed with this format.  Please refer to the patient's chart for her  consent to telehealth for [facility].    Date:  11/06/2020   ID:  [name], DOB 1951-04-24, MRN [id number] The patient was identified using 2 identifiers.  Patient Location: Home Provider Location: Office/Clinic   PCP:  Lindell Spar, MD   [place]  Cardiologist:  [name], MD  Advanced Practice Provider:  No care team member to display Electrophysiologist:  Dr [id number]   Evaluation Performed:  Follow-Up Visit  Chief Complaint:  FOllow up  History of Present Illness:    [name] is a 70 y.o. female seen today for follow up of the following meidcal problems.   1. Chronic combined systolic and diastolic heart failure - echo 03/2014 LVEF 25-30%, restrictive diastolic dysfunction. New diagnosis at that time. Repeat echo 06/2014 LVEF 40-45% - cath 03/2014 showed RCA 99% with left to right collaterals, other arteries patent. Overall LV systolic dysfunction out of proportion to CAD. Medically managed.  - 07/2015 echo LVEF 86%, grade I diastolic dysfunction - Entresto causes dizziness and nausea, stopped taking. Back on losartan - due to low bp's we lowered losartan  to 25mg  daily. Lowered lasix to 20mg  daily, may take 40mg  as needed.   01/2018 echo LVEF 35%, grade I diastoilc dysfunction - 11/2018 echo LVEF 30-35% - 05/03/19 had BIV AICD placed by Dr Rayann Heman  11/2019 LVEF 30-35%   - some recent swelling in legs a few weeks ago that has resolved. Home weight stable around 242-246 lbs - compliant with meds   2. CAD - cath 03/2014 with 99% chronic RCA disease, otherwise patent vessels. Managed medically  - some recent chest pressure, mainly with coughing and recent URI symptoms  3. OSA - on bipap at night. Followed by Dr Radford Pax.  4. Hyperlipidemia -upcoming labs next month 02/2020 TC 76 HDL 33 TG 75 LDL 27 - labs are pending with pcp  5. Chronic LBBB      The patient does not have symptoms concerning for COVID-19 infection (fever, chills, cough, or new shortness of breath).    Past Medical History:  Diagnosis Date  . Anemia   . Arthritis   . CAD (coronary artery disease)    a. LHC (03/2014) [place]: nl, LAD: diff dz proximal 20%, 30-40% dz mid vessel prior 2nd diagonal, LCx: 40% in OM1, 20-30% in OM2, RCA: 99% subtotal occlusion @ crux, TIMI 1 flow, L-R collaterals to distal vessel  . CHF (congestive heart failure) ([place])    Phreesia 10/03/2020  . Chronic systolic heart failure ([place])    a. ECHO (03/2014): EF 25-30%, akinesis enteroanteroseptal myocardium, grade III DD b. RHC (03/2014) RA 4, RV 42/4, PA 44/14 (26), PCWP 21, PA 62% Fick CO/CI 6.9 / 3.4  . Diabetes mellitus   .  Diabetes mellitus without complication ([place])    Phreesia 10/03/2020  . Duodenal ulcer    Age 71  . Erosive esophagitis   . Gastritis   . Hypertension   . Ischemic cardiomyopathy   . Obese   . Short-term memory loss   . Thyroid disease   . TTP (thrombotic thrombocytopenic purpura)    Past Surgical History:  Procedure Laterality Date  . BIV ICD INSERTION CRT-D N/A 05/03/2019   Procedure: BIV ICD INSERTION CRT-D;  Surgeon: Thompson Grayer, MD;   Location: [facility];  Service: Cardiovascular;  Laterality: N/A;  . COLONOSCOPY  08/29/2012   CZY:SAYTKZS diverticulosis  . COLONOSCOPY N/A 05/28/2015   Procedure: COLONOSCOPY;  Surgeon: Daneil Dolin, MD;  [facility];  Service: Endoscopy;  Laterality: N/A;  1015  . ECTOPIC PREGNANCY SURGERY    . ESOPHAGOGASTRODUODENOSCOPY  04/20/2012   [facility]:XNATFTD antral gastritis/Erosive reflux esophagitis  . LEFT AND RIGHT HEART CATHETERIZATION WITH CORONARY ANGIOGRAM N/A 03/28/2014   Procedure: LEFT AND RIGHT HEART CATHETERIZATION WITH CORONARY ANGIOGRAM;  Surgeon: Peter M Martinique, MD;  Location: [facility] C[facility] LAB;  Service: Cardiovascular;  Laterality: N/A;  . SPLENECTOMY, PARTIAL       No outpatient medications have been marked as taking for the 11/06/20 encounter (Appointment) with Arnoldo Lenis, MD.     Allergies:   Other and Lisinopril   Social History   Tobacco Use  . Smoking status: Former Smoker    Packs/day: 0.25    Years: 10.00    Pack years: 2.50    Types: Cigarettes    Start date: 09/20/1966    Quit date: 09/20/1978    Years since quitting: 42.1  . Smokeless tobacco: Never Used  Vaping Use  . Vaping Use: Never used  Substance Use Topics  . Alcohol use: No    Alcohol/week: 0.0 standard drinks  . Drug use: No     Family Hx: The patient's family history includes Breast cancer in her maternal aunt; Cirrhosis in her father; Diabetes in her daughter; Heart failure in her mother. There is no history of Colon cancer.  ROS:   Please see the history of present illness.     All other systems reviewed and are negative.   Prior CV studies:   The following studies were reviewed today:  03/2014 echo Study Conclusions  - Left ventricle: The cavity size was normal. There was mild focal basal hypertrophy of the septum. Systolic function was severely reduced. The estimated ejection fraction was in the range of 25% to 30%. There is akinesis of the entireanteroseptal  myocardium. Doppler parameters are consistent with a reversible restrictive pattern, indicative of decreased left ventricular diastolic compliance and/or increased left atrial pressure (grade 3 diastolic dysfunction). - Mitral valve: There was mild regurgitation. - Left atrium: The atrium was moderately dilated. 03/2014 Cath Procedural Findings:  Hemodynamics  RA 8/0 mean 4 mm Hg  RV 42/4 mm Hg  PA 44/14 mean 26 mm Hg  PCWP 20/28 mean 21 mm Hg  LV 98/18 mm Hg  AO 95/54 mean 72 mm Hg  Oxygen saturations:  PA 62%  AO 90%  Cardiac Output (Fick) 6.9 L/min  Cardiac Index (Fick) 3.4 L/min/meter squared  Coronary angiography:  Coronary dominance: right  Left mainstem: Normal  Left anterior descending (LAD): Diffuse disease in the proximal vessel up to 20%. 30-40% disease in the mid vessel prior to the second diagonal.  Left circumflex (LCx): 40% in OM1. 20-30% in OM2. Otherwise no significant  disease.  Right coronary artery (RCA): 99% subtotal occlusion at the crux. TIMI 1 flow. Left to right collaterals to the distal vessel.  Left ventriculography: Left ventricular systolic function is abnormal, LVEF is estimated at 25%. There is inferior akinesis and severe global hypokinesis. There is mild mitral regurgitation  Final Conclusions:  1. Single vessel obstructive CAD with subtotal occlusion of the RCA at the crux.  2. Severe LV dysfunction.  3. Normal Right heart pressures. Elevated PCWP.  Recommendations: Medical management. Her degree of LV dysfunction is out of proportion to her CAD. She has no anginal symptoms so PCI of RCA not indicated. The inferior wall appears scarred.  07/2015 echo Study Conclusions  - Left ventricle: The cavity size was at the upper limits of normal. Wall thickness was increased in a pattern of moderate LVH. The estimated ejection fraction was 40% (similar calculation per biplane speckle tracking, reduced global longitudinal  strain of -14.3%). There is akinesis of the basalinferolateral and inferior myocardium. Doppler parameters are consistent with abnormal left ventricular relaxation (grade 1 diastolic dysfunction). - Aortic valve: Mildly calcified annulus. Trileaflet. - Mitral valve: Calcified annulus. There was trivial regurgitation. - Left atrium: The atrium was at the upper limits of normal in size. - Right atrium: Central venous pressure (est): 3 mm Hg. - Atrial septum: A patent foramen ovale cannot be excluded. - Tricuspid valve: There was trivial regurgitation. - Pulmonary arteries: Systolic pressure could not be accurately estimated. - Pericardium, extracardiac: There was no pericardial effusion.  Impressions:  - Upper normal LV chamber size with moderate LVH and LVEF approximately 40% as discussed above. There is akinesis of the basal inferoseptal and inferior myocardium. Grade 1 diastolic dysfunction. Compared to the previous study from October 2015, LVEF is in similar range, perhaps slightly reduced. Upper normal left atrial chamber size. Trivial tricuspid regurgitation. Cannot exclude PFO based on limited images.   01/2018 echo Study Conclusions  - Left ventricle: The cavity size was normal. Wall thickness was increased in a pattern of mild LVH. The estimated ejection fraction was 35%. There is akinesis of the basal-midinferior myocardium. There is akinesis of the mid-apicalanteroseptal and apical myocardium. Doppler parameters are consistent with abnormal left ventricular relaxation (grade 1 diastolic dysfunction). - Ventricular septum: Septal motion showed abnormal function and dyssynergy suggestive of left bundle Jovita Persing block. - Aortic valve: Moderately calcified annulus. Trileaflet. - Mitral valve: There was mild regurgitation. - Right atrium: Central venous pressure (est): 3 mm Hg. - Atrial septum: No defect or patent foramen ovale was  identified. - Tricuspid valve: There was trivial regurgitation. - Pulmonary arteries: Systolic pressure could not be accurately estimated. - Pericardium, extracardiac: There was no pericardial effusion   11/2018 echo 1. The left ventricle has moderate-severely reduced systolic function, with an ejection fraction of 30-35%. The cavity size was normal. There is mildly increased left ventricular wall thickness. Left ventricular diastolic Doppler parameters are  consistent with impaired relaxation. Elevated mean left atrial pressure. 2. The anteroseptal, anterolateral, apical walls are hypokinetic. 3. The right ventricle has normal systolic function. The cavity was normal. There is no increase in right ventricular wall thickness. 4. Left atrial size was moderately dilated. 5. No evidence of mitral valve stenosis. 6. The aortic valve is tricuspid no stenosis of the aortic valve. 7. Pulmonary hypertension is indeterminant, inadequate TR jet. 8. The inferior vena cava was dilated in size with <50% respiratory variability. 9. The interatrial septum was not well visualized.  Labs/Other Tests and Data Reviewed:  EKG:  No ECG reviewed.  Recent Labs: 03/14/2020: ALT 30; BUN 33; Creat 1.32; Hemoglobin 11.3; Platelets 199; Potassium 5.5; Sodium 135; TSH 5.62   Recent Lipid Panel Lab Results  Component Value Date/Time   CHOL 76 03/14/2020 03:24 PM   TRIG 75 03/14/2020 03:24 PM   HDL 33 (L) 03/14/2020 03:24 PM   CHOLHDL 2.3 03/14/2020 03:24 PM   LDLCALC 27 03/14/2020 03:24 PM    Wt Readings from Last 3 Encounters:  08/28/20 242 lb (109.8 kg)  07/09/20 246 lb 6.4 oz (111.8 kg)  06/19/20 243 lb (110.2 kg)     Risk Assessment/Calculations:      Objective:    Vital Signs:  Today's Vitals   11/06/20 1051  BP: 124/74  Pulse: 78  Weight: 243 lb (110.2 kg)  Height: 5\' 5"  (1.651 m)   Body mass index is 40.44 kg/m. Normal affect. Normal speech pattern and tone. Comfortable,  no apparent distress. No audible signs of sob or wheezing.   ASSESSMENT & PLAN:   1. Chronic combined systolic/diastolic HF - medical therapy limited by soft bp's. Did not tolerate entresto. She is on her maximally tolerated regimen -s/p BIV AICD - denies symptoms, continue current meds  2. CAD - medically managed single vessel RCA disease -no symptoms, continue current meds  3.Hyperlipidemia - continue statin, has been at goal - upcoming labs with pcp  4. OSA - had been followed by Dr Radford Pax, difficult for patient to get to [facility], looking for local option - will refer to [place] [facility]        COVID-19 Education: The signs and symptoms of COVID-19 were discussed with the patient and how to seek care for testing (follow up with PCP or arrange E-visit).  The importance of social distancing was discussed today.  Time:   Today, I have spent 17 minutes with the patient with telehealth technology discussing the above problems.     Medication Adjustments/Labs and Tests Ordered: Current medicines are reviewed at length with the patient today.  Concerns regarding medicines are outlined above.   Tests Ordered: No orders of the defined types were placed in this encounter.   Medication Changes: No orders of the defined types were placed in this encounter.   Follow Up:  In Person in 6 month(s)  Signed, [name], MD  11/06/2020 8:10 AM    [place]

## 2020-11-06 NOTE — Patient Instructions (Signed)
Your physician recommends that you schedule a follow-up appointment in: [name]  Your physician recommends that you continue on your current medications as directed. Please refer to the Current Medication list given to you today.  You have been referred to PULMONARY   Thank you for choosing [facility]!!

## 2020-11-10 ENCOUNTER — Ambulatory Visit (INDEPENDENT_AMBULATORY_CARE_PROVIDER_SITE_OTHER): Payer: Medicare HMO

## 2020-11-10 DIAGNOSIS — I255 Ischemic cardiomyopathy: Secondary | ICD-10-CM | POA: Diagnosis not present

## 2020-11-11 ENCOUNTER — Other Ambulatory Visit: Payer: Self-pay | Admitting: Family Medicine

## 2020-11-12 LAB — CUP PACEART REMOTE DEVICE CHECK
Battery Remaining Longevity: 49 mo
Battery Remaining Percentage: 74 %
Battery Voltage: 2.98 V
Brady Statistic AP VP Percent: 1 %
Brady Statistic AP VS Percent: 1 %
Brady Statistic AS VP Percent: 98 %
Brady Statistic AS VS Percent: 1 %
Brady Statistic RA Percent Paced: 1 %
Date Time Interrogation Session: 20220221020016
HighPow Impedance: 66 Ohm
HighPow Impedance: 66 Ohm
Implantable Lead Implant Date: 20200813
Implantable Lead Implant Date: 20200813
Implantable Lead Implant Date: 20200813
Implantable Lead Location: 753858
Implantable Lead Location: 753859
Implantable Lead Location: 753860
Implantable Pulse Generator Implant Date: 20200813
Lead Channel Impedance Value: 340 Ohm
Lead Channel Impedance Value: 410 Ohm
Lead Channel Impedance Value: 410 Ohm
Lead Channel Pacing Threshold Amplitude: 0.5 V
Lead Channel Pacing Threshold Amplitude: 0.625 V
Lead Channel Pacing Threshold Amplitude: 1.875 V
Lead Channel Pacing Threshold Pulse Width: 0.4 ms
Lead Channel Pacing Threshold Pulse Width: 0.5 ms
Lead Channel Pacing Threshold Pulse Width: 0.6 ms
Lead Channel Sensing Intrinsic Amplitude: 12 mV
Lead Channel Sensing Intrinsic Amplitude: 4.6 mV
Lead Channel Setting Pacing Amplitude: 2 V
Lead Channel Setting Pacing Amplitude: 2 V
Lead Channel Setting Pacing Amplitude: 2.875
Lead Channel Setting Pacing Pulse Width: 0.5 ms
Lead Channel Setting Pacing Pulse Width: 0.6 ms
Lead Channel Setting Sensing Sensitivity: 0.5 mV
Pulse Gen Serial Number: 9891600

## 2020-11-18 NOTE — Progress Notes (Signed)
Remote ICD transmission.   

## 2020-11-19 ENCOUNTER — Encounter: Payer: Self-pay | Admitting: Pulmonary Disease

## 2020-12-02 ENCOUNTER — Ambulatory Visit (INDEPENDENT_AMBULATORY_CARE_PROVIDER_SITE_OTHER): Payer: Medicare HMO | Admitting: Internal Medicine

## 2020-12-02 ENCOUNTER — Other Ambulatory Visit: Payer: Self-pay

## 2020-12-02 ENCOUNTER — Encounter: Payer: Self-pay | Admitting: Internal Medicine

## 2020-12-02 VITALS — BP 109/63 | HR 94 | Resp 18 | Ht 65.0 in | Wt 241.4 lb

## 2020-12-02 DIAGNOSIS — I5042 Chronic combined systolic (congestive) and diastolic (congestive) heart failure: Secondary | ICD-10-CM

## 2020-12-02 DIAGNOSIS — Z0100 Encounter for examination of eyes and vision without abnormal findings: Secondary | ICD-10-CM

## 2020-12-02 DIAGNOSIS — E039 Hypothyroidism, unspecified: Secondary | ICD-10-CM

## 2020-12-02 DIAGNOSIS — E119 Type 2 diabetes mellitus without complications: Secondary | ICD-10-CM | POA: Diagnosis not present

## 2020-12-02 DIAGNOSIS — I110 Hypertensive heart disease with heart failure: Secondary | ICD-10-CM | POA: Diagnosis not present

## 2020-12-02 DIAGNOSIS — G4733 Obstructive sleep apnea (adult) (pediatric): Secondary | ICD-10-CM

## 2020-12-02 DIAGNOSIS — I10 Essential (primary) hypertension: Secondary | ICD-10-CM | POA: Diagnosis not present

## 2020-12-02 DIAGNOSIS — E1140 Type 2 diabetes mellitus with diabetic neuropathy, unspecified: Secondary | ICD-10-CM | POA: Diagnosis not present

## 2020-12-02 NOTE — Assessment & Plan Note (Signed)
On Levothyroxine 50 mcg QD Follow up TSH and free T4

## 2020-12-02 NOTE — Assessment & Plan Note (Signed)
Needs a new sleep study for CPAP now as old CPAP device was recalled Has an appointment with Pulmonology

## 2020-12-02 NOTE — Assessment & Plan Note (Signed)
Well-controlled with Losartan, Coreg, Spironolactone and Lasix 

## 2020-12-02 NOTE — Patient Instructions (Signed)
Please get fasting blood tests done within a week.  Continue to follow low carbohydrate diet. Avoid skipping any meal. Try to take small, frequent meals.  Please try to discuss loop recorder concern with Dr Harl Bowie.

## 2020-12-02 NOTE — Progress Notes (Signed)
Established Patient Office Visit  Subjective:  Patient ID: [name], female    DOB: 1951-02-08  Age: 70 y.o. MRN: [id number]  CC:  Chief Complaint  Patient presents with  . Follow-up of chronic medical conditions        HPI [name] is a 70 y.o. female with PMH ofchronic combined systolic and diastolic CHF with ejection fraction of 30 to 35%, ischemic cardiomyopathy status post ICD placement, hypertension, hyperlipidemia, type 2 diabetes mellitus, obstructive sleep apnea on BiPAP at night, obesity, coronary artery disease, GERD and hypothyroidism who presents for follow-up of her chronic medical conditions.  She has been doing well overall.  Her blood pressure has been stable at home. Takes medications regularly. Patient denies headache, dizziness, chest pain, dyspnea or palpitations. She has   She has been feeling tired lately and has had intermittent leg swelling. Denies any dyspnea, orthopnea or PND currently. She has been having localized discomfort around the AICD site, for which she is advised to contact Cardiologist. No local erythema, warmth or swelling noted.  She checks her blood glucose regularly.  She takes insulin regularly. Last HbA1C was 7.8.  She has noticed some episodes of hypoglycemia, around 70s.  She has been taking Levothyroxine 50 mcg QD.  She used CPAP regularly for OSA. But she needs a new sleep study as her old CPAP device was recalled.    Past Medical History:  Diagnosis Date  . Anemia   . Arthritis   . CAD (coronary artery disease)    a. LHC (03/2014) Lmain: nl, LAD: diff dz proximal 20%, 30-40% dz mid vessel prior 2nd diagonal, LCx: 40% in OM1, 20-30% in OM2, RCA: 99% subtotal occlusion @ crux, TIMI 1 flow, L-R collaterals to distal vessel  . CHF (congestive heart failure) ([place])    Phreesia 10/03/2020  . Chronic systolic heart failure ([place])    a. ECHO (03/2014): EF 25-30%, akinesis enteroanteroseptal myocardium, grade III DD b. RHC  (03/2014) RA 4, RV 42/4, PA 44/14 (26), PCWP 21, PA 62% Fick CO/CI 6.9 / 3.4  . Diabetes mellitus   . Diabetes mellitus without complication ([place])    Phreesia 10/03/2020  . Duodenal ulcer    Age 66  . Erosive esophagitis   . Gastritis   . Hypertension   . Ischemic cardiomyopathy   . Obese   . Short-term memory loss   . Thyroid disease   . TTP (thrombotic thrombocytopenic purpura)     Past Surgical History:  Procedure Laterality Date  . BIV ICD INSERTION CRT-D N/A 05/03/2019   Procedure: BIV ICD INSERTION CRT-D;  Surgeon: Thompson Grayer, MD;  Location: [place] [facility];  Service: Cardiovascular;  Laterality: N/A;  . COLONOSCOPY  08/29/2012   HEN:IDPOEUM diverticulosis  . COLONOSCOPY N/A 05/28/2015   Procedure: COLONOSCOPY;  Surgeon: Daneil Dolin, MD;  [facility];  Service: Endoscopy;  Laterality: N/A;  1015  . ECTOPIC PREGNANCY SURGERY    . ESOPHAGOGASTRODUODENOSCOPY  04/20/2012   PNT:IRWERXV antral gastritis/Erosive reflux esophagitis  . LEFT AND RIGHT HEART CATHETERIZATION WITH CORONARY ANGIOGRAM N/A 03/28/2014   Procedure: LEFT AND RIGHT HEART CATHETERIZATION WITH CORONARY ANGIOGRAM;  Surgeon: Peter M Martinique, MD;  Location: [facility] CATH LAB;  Service: Cardiovascular;  Laterality: N/A;  . SPLENECTOMY, PARTIAL      Family History  Problem Relation Age of Onset  . Heart failure Mother   . Cirrhosis Father        ETOH  . Diabetes Daughter   .  Breast cancer Maternal Aunt   . Colon cancer Neg Hx     Social History   Socioeconomic History  . Marital status: Divorced    Spouse name: Not on file  . Number of children: 3  . Years of education: Not on file  . Highest education level: Not on file  Occupational History  . Occupation: disabled    Fish farm manager: NOT EMPLOYED  Tobacco Use  . Smoking status: Former Smoker    Packs/day: 0.25    Years: 10.00    Pack years: 2.50    Types: Cigarettes    Start date: 09/20/1966    Quit date: 09/20/1978    Years since quitting: 42.2   . Smokeless tobacco: Never Used  Vaping Use  . Vaping Use: Never used  Substance and Sexual Activity  . Alcohol use: No    Alcohol/week: 0.0 standard drinks  . Drug use: No  . Sexual activity: Not on file  Other Topics Concern  . Not on file  Social History Narrative   Lives Alone       Social Determinants of Health   Financial Resource Strain: Low Risk   . Difficulty of Paying Living Expenses: Not hard at all  Food Insecurity: No Food Insecurity  . Worried About Charity fundraiser in the Last Year: Never true  . Ran Out of Food in the Last Year: Never true  Transportation Needs: No Transportation Needs  . Lack of Transportation (Medical): No  . Lack of Transportation (Non-Medical): No  Physical Activity: Insufficiently Active  . Days of Exercise per Week: 2 days  . Minutes of Exercise per Session: 20 min  Stress: No Stress Concern Present  . Feeling of Stress : Not at all  Social Connections: Socially Isolated  . Frequency of Communication with Friends and Family: Never  . Frequency of Social Gatherings with Friends and Family: Never  . Attends Religious Services: More than 4 times per year  . Active Member of Clubs or Organizations: No  . Attends Archivist Meetings: Never  . Marital Status: Divorced  Human resources officer Violence: Not At Risk  . Fear of Current or Ex-Partner: No  . Emotionally Abused: No  . Physically Abused: No  . Sexually Abused: No    Outpatient Medications Prior to Visit  Medication Sig Dispense Refill  . acetaminophen (TYLENOL) 500 MG tablet Take 1,000 mg by mouth every 6 (six) hours as needed for moderate pain.    Marland Kitchen aspirin 81 MG chewable tablet Chew 1 tablet (81 mg total) by mouth daily.    Marland Kitchen atorvastatin (LIPITOR) 80 MG tablet TAKE 1 TABLET EVERY DAY 90 tablet 1  . blood glucose meter kit and supplies Dispense based on patient and insurance preference. Use up to four times daily as directed. (FOR ICD-10 E10.9, E11.9). 1 each 0  .  carvedilol (COREG) 25 MG tablet TAKE 1 TABLET TWICE DAILY 180 tablet 3  . Ciclopirox 0.77 % gel APPLY ONE TIME DAILY ON THE AFFECTED TOE NAIL. 30 g 0  . furosemide (LASIX) 20 MG tablet TAKE 1 TABLET EVERY DAY 90 tablet 3  . LANTUS 100 UNIT/ML injection INJECT  40 UNITS SUBCUTANEOUSLY TWICE DAILY 70 mL 0  . levothyroxine (SYNTHROID) 50 MCG tablet Take 50 mcg by mouth daily before breakfast.    . losartan (COZAAR) 25 MG tablet TAKE 1 TABLET EVERY DAY 90 tablet 2  . Multiple Vitamin (MULTIVITAMIN) tablet Take 1 tablet by mouth daily.    Marland Kitchen  NOVOLOG 100 UNIT/ML injection INJECT 10 UNITS SUBCUTANEOUSLY THREE TIMES DAILY WITH MEALS (VIAL EXPIRES 28 DAYS AFTER OPENING) 20 mL 0  . omega-3 fish oil (MAXEPA) 1000 MG CAPS capsule Take 1 capsule by mouth daily.    . polyethylene glycol (MIRALAX / GLYCOLAX) packet Take 17 g by mouth daily as needed for mild constipation.     Marland Kitchen spironolactone (ALDACTONE) 25 MG tablet TAKE 1 TABLET EVERY DAY 90 tablet 1   No facility-administered medications prior to visit.    Allergies  Allergen Reactions  . Other Other (See Comments)    FLAGYL, CIPRO, PROTONIX taken at the same time caused patient to lose her breath and have trouble breathing, requiring hospital stay at [facility], 03/23/14-per patient.   . Lisinopril Cough    ROS Review of Systems  Constitutional: Positive for fatigue. Negative for chills and fever.  HENT: Negative for congestion, sinus pressure, sinus pain and sore throat.   Eyes: Negative for pain and discharge.  Respiratory: Negative for cough and shortness of breath.   Cardiovascular: Negative for chest pain, palpitations and leg swelling.  Gastrointestinal: Negative for abdominal pain, constipation, diarrhea, nausea and vomiting.  Endocrine: Negative for polydipsia and polyuria.  Genitourinary: Negative for dysuria and hematuria.  Musculoskeletal: Negative for neck pain and neck stiffness.  Skin: Negative for rash.  Neurological: Negative for speech  difficulty, weakness, numbness and headaches.  Psychiatric/Behavioral: Negative for agitation and behavioral problems.      Objective:    Physical Exam Vitals reviewed.  Constitutional:      General: She is not in acute distress.    Appearance: She is not diaphoretic.  HENT:     Head: Normocephalic and atraumatic.     Nose: Nose normal. No congestion.     Mouth/Throat:     Mouth: Mucous membranes are moist.     Pharynx: No posterior oropharyngeal erythema.  Eyes:     General: No scleral icterus.    Extraocular Movements: Extraocular movements intact.  Cardiovascular:     Rate and Rhythm: Normal rate and regular rhythm.     Pulses: Normal pulses.     Heart sounds: Normal heart sounds. No murmur heard.   Pulmonary:     Breath sounds: Normal breath sounds. No wheezing or rales.  Musculoskeletal:     Cervical back: Neck supple. No tenderness.     Right lower leg: No edema.     Left lower leg: No edema.  Skin:    General: Skin is warm.     Findings: No rash.  Neurological:     General: No focal deficit present.     Mental Status: She is alert and oriented to person, place, and time.     Sensory: No sensory deficit.     Motor: No weakness.  Psychiatric:        Mood and Affect: Mood normal.        Behavior: Behavior normal.     BP 109/63 (BP Location: Right Arm, Patient Position: Sitting, Cuff Size: Normal)   Pulse 94   Resp 18   Ht _0  (1.651 m)   Wt 241 lb 6.4 oz (109.5 kg)   SpO2 95%   BMI 40.17 kg/m  Wt Readings from Last 3 Encounters:  12/02/20 241 lb 6.4 oz (109.5 kg)  11/06/20 243 lb (110.2 kg)  08/28/20 242 lb (109.8 kg)     Health Maintenance Due  Topic Date Due  . OPHTHALMOLOGY EXAM  Never done  . TETANUS/TDAP  Never done  . DEXA SCAN  Never done  . PNA vac Low Risk Adult (1 of 2 - PCV13) 11/20/2013  . COVID-19 Vaccine (3 - Booster for Moderna series) 07/03/2020    There are no preventive care reminders to display for this patient.  Lab  Results  Component Value Date   TSH 5.62 (H) 03/14/2020   Lab Results  Component Value Date   WBC 5.6 03/14/2020   HGB 11.3 (L) 03/14/2020   HCT 34.8 (L) 03/14/2020   MCV 86.4 03/14/2020   PLT 199 03/14/2020   Lab Results  Component Value Date   NA 135 03/14/2020   K 5.5 (H) 03/14/2020   CO2 26 03/14/2020   GLUCOSE 162 (H) 03/14/2020   BUN 33 (H) 03/14/2020   CREATININE 1.32 (H) 03/14/2020   BILITOT 0.6 03/14/2020   ALKPHOS 90 06/14/2015   AST 20 03/14/2020   ALT 30 (H) 03/14/2020   PROT 7.9 03/14/2020   ALBUMIN 4.0 06/14/2015   CALCIUM 9.9 03/14/2020   ANIONGAP 8 04/30/2019   Lab Results  Component Value Date   CHOL 76 03/14/2020   Lab Results  Component Value Date   HDL 33 (L) 03/14/2020   Lab Results  Component Value Date   LDLCALC 27 03/14/2020   Lab Results  Component Value Date   TRIG 75 03/14/2020   Lab Results  Component Value Date   CHOLHDL 2.3 03/14/2020   Lab Results  Component Value Date   HGBA1C 7.8 (A) 07/09/2020   HGBA1C 7.8 07/09/2020   HGBA1C 7.8 (A) 07/09/2020   HGBA1C 7.8 (A) 07/09/2020      Assessment & Plan:   Problem List Items Addressed This Visit      Cardiovascular and Mediastinum   HTN (hypertension) - Primary    Well-controlled with Losartan, Coreg, Spironolactone and Lasix      Relevant Orders   CBC   Chronic combined systolic and diastolic CHF (congestive heart failure) (HCC)    On ARB, Spironolactone and Lasix Appears euvolemic currently      Relevant Orders   CBC   BASIC METABOLIC PANEL WITH GFR     Respiratory   OSA (obstructive sleep apnea)    Needs a new sleep study for CPAP now as old CPAP device was recalled Has an appointment with Pulmonology        Endocrine   Type 2 diabetes mellitus with diabetic neuropathy (HCC)    HbA1C: 7.8 On Lantus 48 U BID and Novolog 12 U TID with meals Advised to follow diabetic diet On statin F/u CMP and lipid panel Diabetic eye exam: Advised to follow up with  Ophthalmology for diabetic eye exam Due to episodes of hypoglycemia, might decrease insulin. Avoid skipping any meal. Advised to keep snacks or orange juice accessible most of the time. Will wait for next HbA1C.       Relevant Orders   BASIC METABOLIC PANEL WITH GFR   Lipid panel   Hemoglobin A1c   Hypothyroidism    On Levothyroxine 50 mcg QD Follow up TSH and free T4      Relevant Orders   TSH + free T4    Other Visit Diagnoses    Diabetic eye exam [facility])       Relevant Orders   Ambulatory referral to Ophthalmology      No orders of the defined types were placed in this encounter.   Follow-up: Return in about 4 months (around 04/03/2021) for DM.  Daiwik Buffalo K Vianne Grieshop, MD 

## 2020-12-02 NOTE — Assessment & Plan Note (Signed)
On ARB, Spironolactone and Lasix Appears euvolemic currently 

## 2020-12-02 NOTE — Assessment & Plan Note (Signed)
HbA1C: 7.8 On Lantus 48 U BID and Novolog 12 U TID with meals Advised to follow diabetic diet On statin F/u CMP and lipid panel Diabetic eye exam: Advised to follow up with Ophthalmology for diabetic eye exam Due to episodes of hypoglycemia, might decrease insulin. Avoid skipping any meal. Advised to keep snacks or orange juice accessible most of the time. Will wait for next HbA1C.

## 2020-12-05 ENCOUNTER — Other Ambulatory Visit: Payer: Self-pay | Admitting: Family Medicine

## 2020-12-08 ENCOUNTER — Ambulatory Visit (INDEPENDENT_AMBULATORY_CARE_PROVIDER_SITE_OTHER): Payer: Medicare HMO

## 2020-12-08 DIAGNOSIS — I5022 Chronic systolic (congestive) heart failure: Secondary | ICD-10-CM | POA: Diagnosis not present

## 2020-12-08 DIAGNOSIS — Z95810 Presence of automatic (implantable) cardiac defibrillator: Secondary | ICD-10-CM

## 2020-12-10 NOTE — Progress Notes (Signed)
EPIC Encounter for ICM Monitoring  Patient Name: [name] is a 70 y.o. female Date: 12/10/2020 Primary Care Physican: Lindell Spar, MD Primary Cardiologist:Branch Electrophysiologist:Allred Bi-V Pacing:98% 3/23/2022Weight:241lbs   Spoke with patient and reports feeling well at this time.  Denies fluid symptoms.    CorvueThoracic impedancenormal.  Prescribed: Furosemide20 mg take 1 tablet by mouth dailyand per Dr Nelly Laurence 10/08/19 OV note, she may take 40 mg as needed.  Labs: 03/14/2020 Creatinine1.32, BUN33, Potassium5.5, Sodium135, [id number] A complete set of results can be found in Results Review.  Recommendations: No changes and encouraged to call if experiencing any fluid symptoms.  Follow-up plan: ICM clinic phone appointment on4/25/2022. 91 day device clinic remote transmission5/23/2022.   EP/Cardiology Office Visits:8/29/2022with Dr.Branch. Message sent 12/10/20 to EP scheduler to call patient to schedule EP overdue appt.  Last visit with Dr Rayann Heman was 04/2019.  Copy of ICM check sent to Dr.Allred.  3 month ICM trend: 12/08/2020.    1 Year ICM trend:       [name], RN 12/10/2020 10:42 AM

## 2020-12-17 ENCOUNTER — Institutional Professional Consult (permissible substitution): Payer: Medicare HMO | Admitting: Pulmonary Disease

## 2020-12-31 ENCOUNTER — Other Ambulatory Visit: Payer: Self-pay | Admitting: Internal Medicine

## 2020-12-31 IMAGING — MG MM DIGITAL DIAGNOSTIC UNILAT*R* W/ TOMO W/ CAD
4 series · 4 of 12 positions shown · non-contrast
Comparison: None.

CLINICAL DATA: The patient was called back for a possible right
breast mass.

EXAM:
DIGITAL DIAGNOSTIC RIGHT MAMMOGRAM WITH TOMO
ULTRASOUND RIGHT BREAST

[R CC synth-2D]
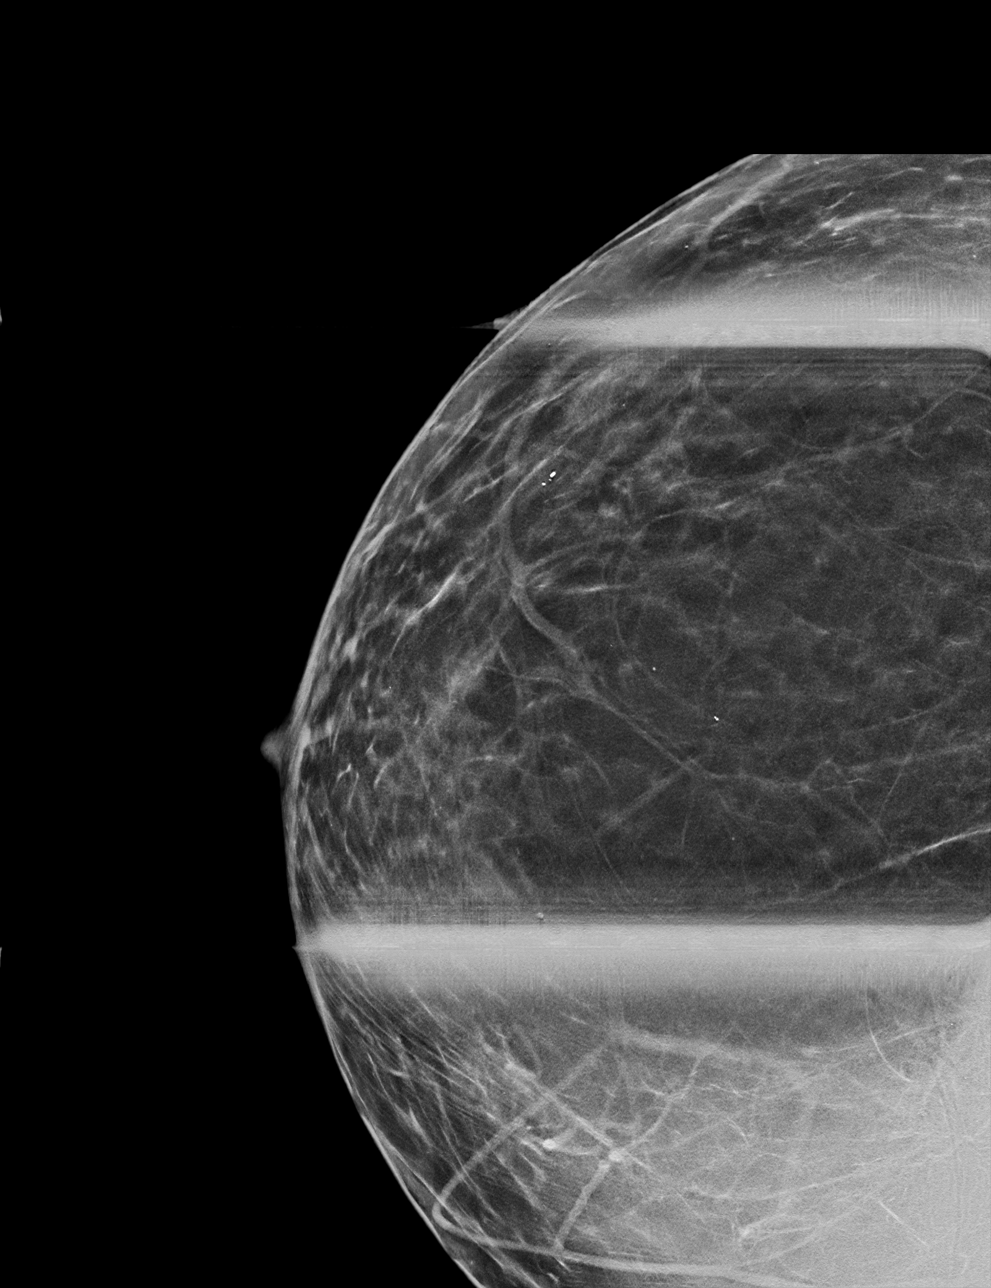

[R MLO synth-2D]
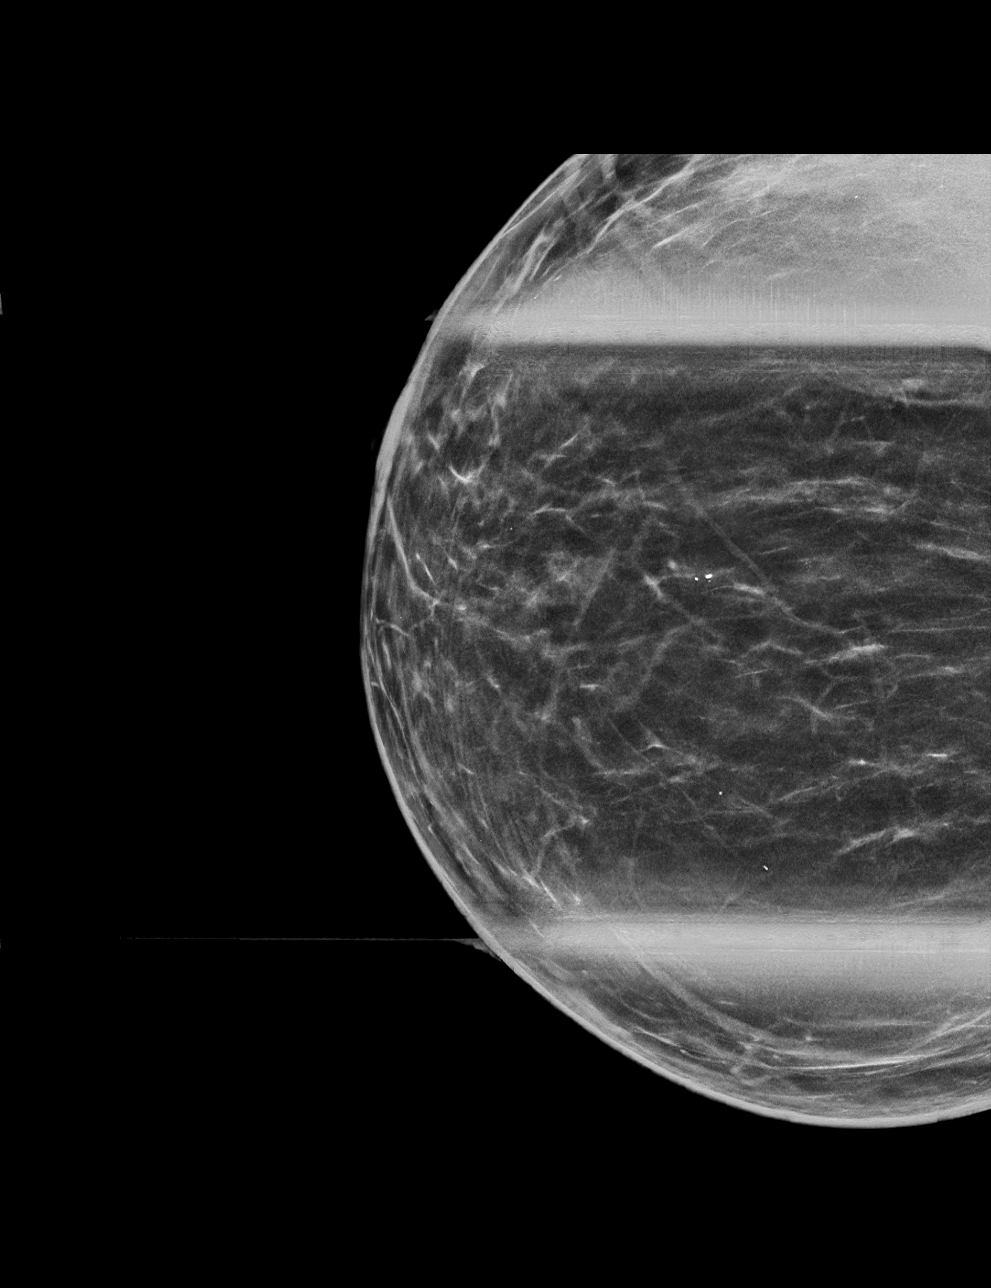

[R CC tomo · tomo slice 29/58.0]
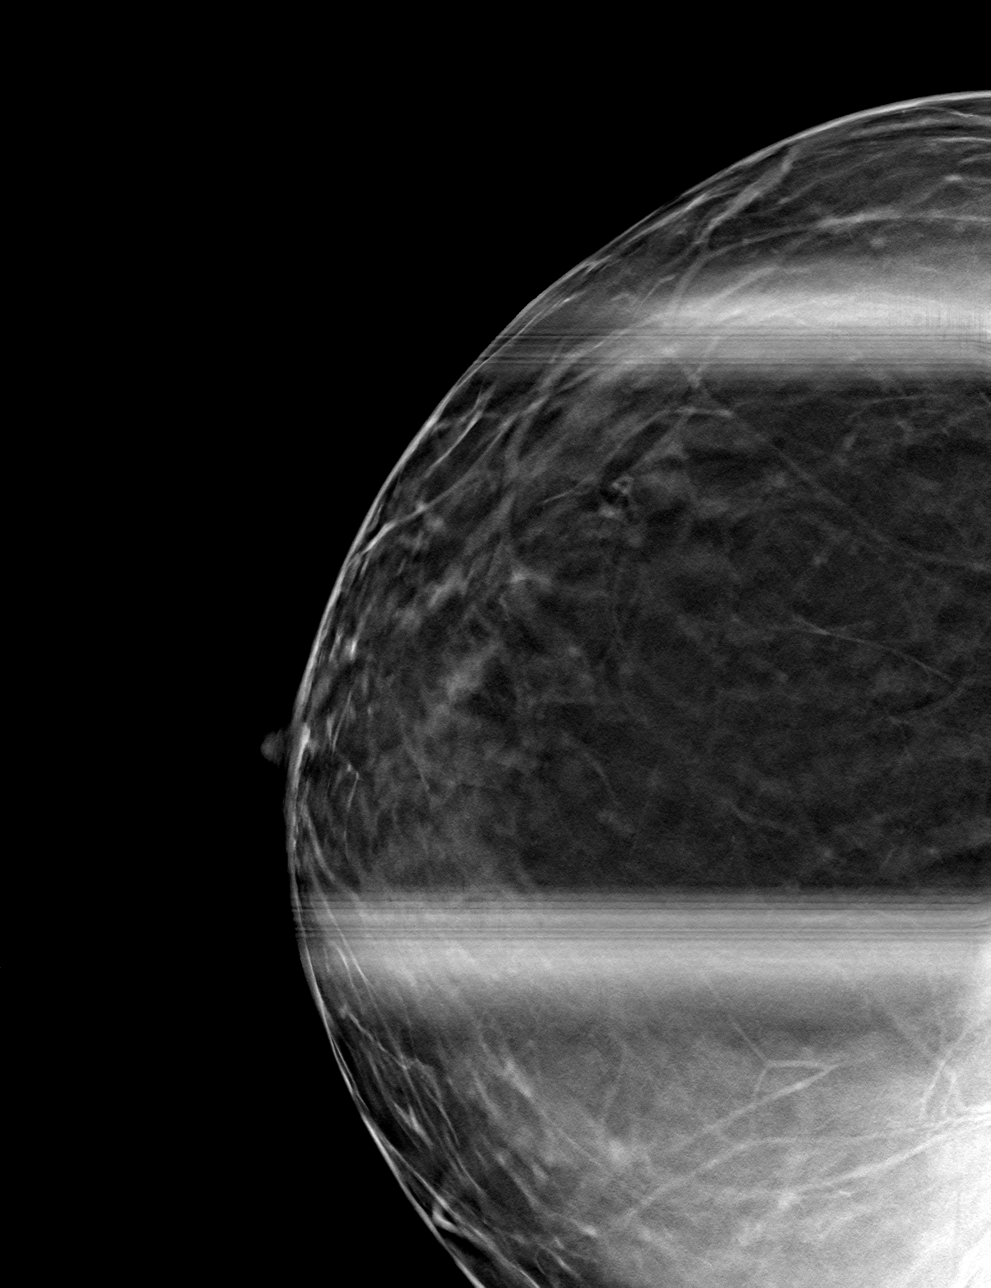

[R MLO tomo · tomo slice 29/58.0]
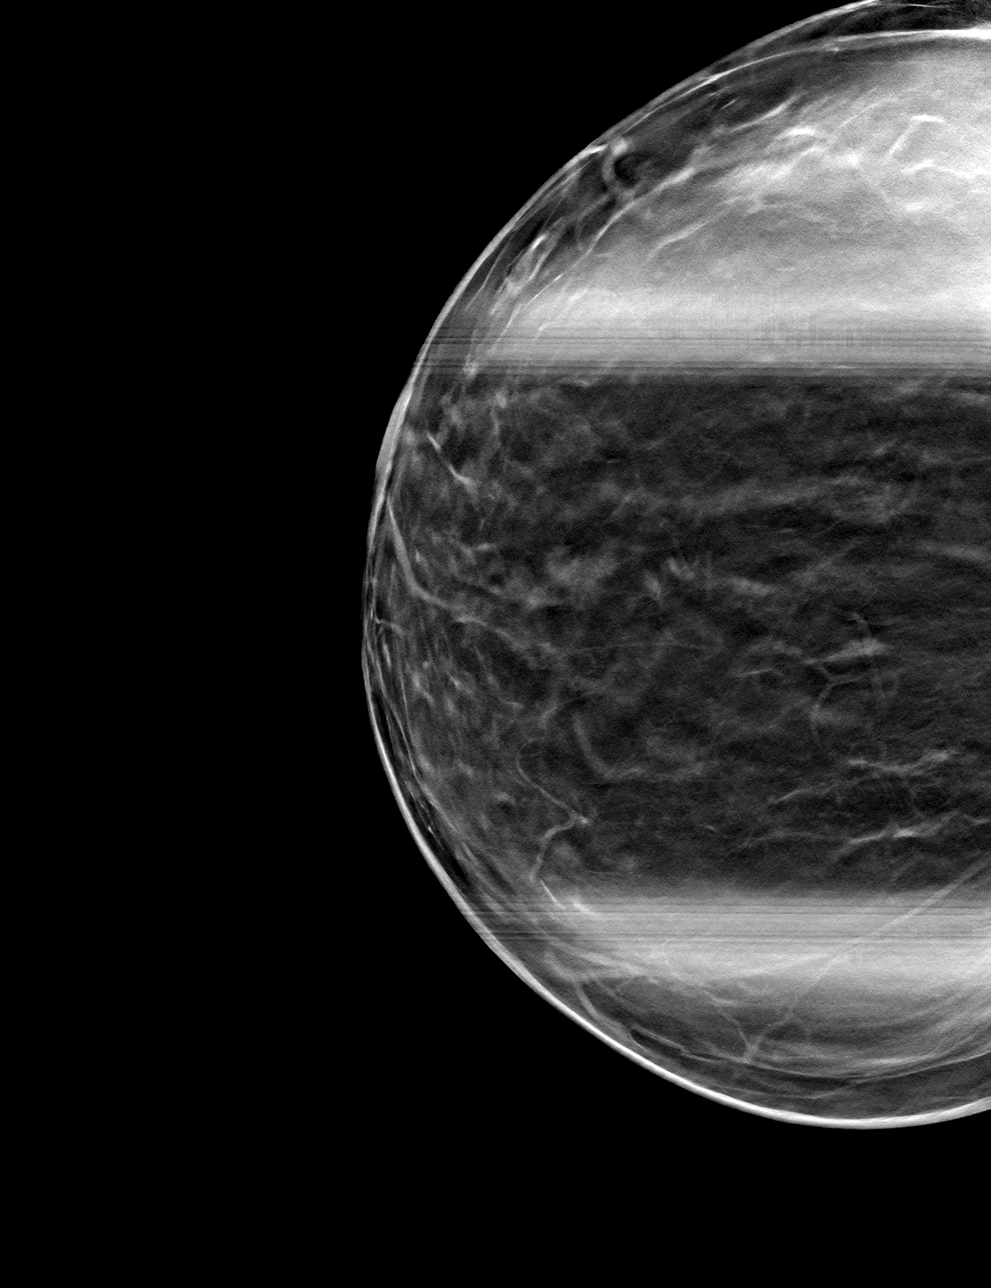

[4 of 12 positions shown; findings below may reference images not displayed]

ACR Breast Density Category b: There are scattered areas of
fibroglandular density.
FINDINGS: Possible right breast mass resolves on additional imaging.

On physical exam, no suspicious lumps are identified.

Targeted ultrasound is performed, showing no sonographic
abnormalities in the right breast.
IMPRESSION: No mammographic or sonographic evidence of malignancy.

RECOMMENDATION:
Annual screening mammography.

I have discussed the findings and recommendations with the patient.
If applicable, a reminder letter will be sent to the patient
regarding the next appointment.

BI-RADS CATEGORY  1: Negative.

## 2021-01-06 ENCOUNTER — Ambulatory Visit: Payer: Medicare HMO | Admitting: Internal Medicine

## 2021-01-12 ENCOUNTER — Ambulatory Visit (INDEPENDENT_AMBULATORY_CARE_PROVIDER_SITE_OTHER): Payer: Medicare HMO

## 2021-01-12 DIAGNOSIS — Z95810 Presence of automatic (implantable) cardiac defibrillator: Secondary | ICD-10-CM

## 2021-01-12 DIAGNOSIS — I5022 Chronic systolic (congestive) heart failure: Secondary | ICD-10-CM | POA: Diagnosis not present

## 2021-01-14 ENCOUNTER — Other Ambulatory Visit: Payer: Self-pay | Admitting: Family Medicine

## 2021-01-14 NOTE — Progress Notes (Signed)
EPIC Encounter for ICM Monitoring  Patient Name: [name] is a 70 y.o. female Date: 01/14/2021 Primary Care Physican: Lindell Spar, MD Primary Cardiologist:Branch Electrophysiologist:Allred Bi-V Pacing:98% 4/27/2022Weight:241lbs   Spoke with patient and reports feeling well at this time.  Denies fluid symptoms.    She had a little ankle swelling and feeling "off" during decreased impedance.   She has been snacking on high salty foods.   CorvueThoracic impedancenormal but was suggesting possible fluid accumulation from 4/9-4/20.  Prescribed: Furosemide20 mg take 1 tablet by mouth dailyand per Dr Nelly Laurence 10/08/19 OV note, she may take 40 mg as needed.  Labs: 03/14/2020 Creatinine1.32, BUN33, Potassium5.5, Sodium135, [id number] A complete set of results can be found in Results Review.  Recommendations: No changes and encouraged to call if experiencing any fluid symptoms.  Follow-up plan: ICM clinic phone appointment on6/02/2021. 91 day device clinic remote transmission5/23/2022.   EP/Cardiology Office Visits:8/29/2022with Dr.Branch. 01/23/2021 with Dr Rayann Heman.  Copy of ICM check sent to Dr.Allred.  3 month ICM trend: 01/12/2021.    1 Year ICM trend:       [name], RN 01/14/2021 8:41 AM

## 2021-01-23 ENCOUNTER — Other Ambulatory Visit: Payer: Self-pay | Admitting: Internal Medicine

## 2021-01-23 ENCOUNTER — Ambulatory Visit (INDEPENDENT_AMBULATORY_CARE_PROVIDER_SITE_OTHER): Payer: Medicare HMO | Admitting: Internal Medicine

## 2021-01-23 ENCOUNTER — Encounter: Payer: Self-pay | Admitting: Internal Medicine

## 2021-01-23 ENCOUNTER — Encounter: Payer: Medicare HMO | Admitting: Internal Medicine

## 2021-01-23 VITALS — BP 112/72 | HR 80 | Ht 65.0 in | Wt 242.2 lb

## 2021-01-23 DIAGNOSIS — I2510 Atherosclerotic heart disease of native coronary artery without angina pectoris: Secondary | ICD-10-CM

## 2021-01-23 DIAGNOSIS — I255 Ischemic cardiomyopathy: Secondary | ICD-10-CM

## 2021-01-23 DIAGNOSIS — I5022 Chronic systolic (congestive) heart failure: Secondary | ICD-10-CM | POA: Diagnosis not present

## 2021-01-23 DIAGNOSIS — I10 Essential (primary) hypertension: Secondary | ICD-10-CM | POA: Diagnosis not present

## 2021-01-23 DIAGNOSIS — Z6841 Body Mass Index (BMI) 40.0 and over, adult: Secondary | ICD-10-CM | POA: Diagnosis not present

## 2021-01-23 LAB — CUP PACEART INCLINIC DEVICE CHECK
Battery Remaining Longevity: 55 mo
Brady Statistic RA Percent Paced: 0.01 %
Brady Statistic RV Percent Paced: 98 %
Date Time Interrogation Session: 20220506154859
HighPow Impedance: 69.75 Ohm
Implantable Lead Implant Date: 20200813
Implantable Lead Implant Date: 20200813
Implantable Lead Implant Date: 20200813
Implantable Lead Location: 753858
Implantable Lead Location: 753859
Implantable Lead Location: 753860
Implantable Pulse Generator Implant Date: 20200813
Lead Channel Impedance Value: 400 Ohm
Lead Channel Impedance Value: 412.5 Ohm
Lead Channel Impedance Value: 412.5 Ohm
Lead Channel Pacing Threshold Amplitude: 0.5 V
Lead Channel Pacing Threshold Amplitude: 0.5 V
Lead Channel Pacing Threshold Amplitude: 0.625 V
Lead Channel Pacing Threshold Amplitude: 1.375 V
Lead Channel Pacing Threshold Pulse Width: 0.4 ms
Lead Channel Pacing Threshold Pulse Width: 0.4 ms
Lead Channel Pacing Threshold Pulse Width: 0.5 ms
Lead Channel Pacing Threshold Pulse Width: 0.6 ms
Lead Channel Sensing Intrinsic Amplitude: 11.8 mV
Lead Channel Sensing Intrinsic Amplitude: 5 mV
Lead Channel Setting Pacing Amplitude: 2 V
Lead Channel Setting Pacing Amplitude: 2 V
Lead Channel Setting Pacing Amplitude: 2.375
Lead Channel Setting Pacing Pulse Width: 0.5 ms
Lead Channel Setting Pacing Pulse Width: 0.6 ms
Lead Channel Setting Sensing Sensitivity: 0.5 mV
Pulse Gen Serial Number: 9891600

## 2021-01-23 MED ORDER — EMPAGLIFLOZIN 10 MG PO TABS: 10.0000 mg | ORAL_TABLET | Freq: Every day | ORAL | 6 refills | Status: DC

## 2021-01-23 MED ORDER — INSULIN ASPART 100 UNIT/ML IJ SOLN: 6.0000 [IU] | Freq: Three times a day (TID) | INTRAMUSCULAR | Status: DC

## 2021-01-23 MED ORDER — FUROSEMIDE 40 MG PO TABS: 40.0000 mg | ORAL_TABLET | Freq: Every day | ORAL | 6 refills | Status: DC

## 2021-01-23 MED ORDER — INSULIN GLARGINE 100 UNIT/ML ~~LOC~~ SOLN: 30.0000 [IU] | Freq: Two times a day (BID) | SUBCUTANEOUS | Status: DC

## 2021-01-23 NOTE — Progress Notes (Signed)
PCP: Lindell Spar, MD Primary Cardiologist: Dr Harl Bowie Primary EP: Dr Rayann Heman  [name] is a 70 y.o. female who presents today for routine electrophysiology followup.  Since last being seen in our clinic, the patient reports doing reasonably well.  + edema and occasional SOB.  Today, she denies symptoms of palpitations, chest pain,  dizziness, presyncope, syncope, or ICD shocks.  The patient is otherwise without complaint today.   Past Medical History:  Diagnosis Date  . Anemia   . Arthritis   . CAD (coronary artery disease)    a. LHC (03/2014) Lmain: nl, LAD: diff dz proximal 20%, 30-40% dz mid vessel prior 2nd diagonal, LCx: 40% in OM1, 20-30% in OM2, RCA: 99% subtotal occlusion @ crux, TIMI 1 flow, L-R collaterals to distal vessel  . CHF (congestive heart failure) ([place])    Phreesia 10/03/2020  . Chronic systolic heart failure ([place])    a. ECHO (03/2014): EF 25-30%, akinesis enteroanteroseptal myocardium, grade III DD b. RHC (03/2014) RA 4, RV 42/4, PA 44/14 (26), PCWP 21, PA 62% Fick CO/CI 6.9 / 3.4  . Diabetes mellitus   . Diabetes mellitus without complication ([place])    Phreesia 10/03/2020  . Duodenal ulcer    Age 75  . Erosive esophagitis   . Gastritis   . Hypertension   . Ischemic cardiomyopathy   . Obese   . Short-term memory loss   . Thyroid disease   . TTP (thrombotic thrombocytopenic purpura)    Past Surgical History:  Procedure Laterality Date  . BIV ICD INSERTION CRT-D N/A 05/03/2019   Procedure: BIV ICD INSERTION CRT-D;  Surgeon: Thompson Grayer, MD;  Location: [place] CV LAB;  Service: Cardiovascular;  Laterality: N/A;  . COLONOSCOPY  08/29/2012   PRX:YVOPFYT diverticulosis  . COLONOSCOPY N/A 05/28/2015   Procedure: COLONOSCOPY;  Surgeon: Daneil Dolin, MD;  [facility];  Service: Endoscopy;  Laterality: N/A;  1015  . ECTOPIC PREGNANCY SURGERY    . ESOPHAGOGASTRODUODENOSCOPY  04/20/2012   WKM:[id number] antral gastritis/Erosive reflux esophagitis   . LEFT AND RIGHT HEART CATHETERIZATION WITH CORONARY ANGIOGRAM N/A 03/28/2014   Procedure: LEFT AND RIGHT HEART CATHETERIZATION WITH CORONARY ANGIOGRAM;  Surgeon: Peter M Martinique, MD;  Location: [facility] C[facility] LAB;  Service: Cardiovascular;  Laterality: N/A;  . SPLENECTOMY, PARTIAL      ROS- all systems are reviewed and negative except as per HPI above  Current Outpatient Medications  Medication Sig Dispense Refill  . acetaminophen (TYLENOL) 500 MG tablet Take 1,000 mg by mouth every 6 (six) hours as needed for moderate pain.    Marland Kitchen aspirin 81 MG chewable tablet Chew 1 tablet (81 mg total) by mouth daily.    Marland Kitchen atorvastatin (LIPITOR) 80 MG tablet TAKE 1 TABLET EVERY DAY 90 tablet 1  . blood glucose meter kit and supplies Dispense based on patient and insurance preference. Use up to four times daily as directed. (FOR ICD-10 E10.9, E11.9). 1 each 0  . carvedilol (COREG) 25 MG tablet TAKE 1 TABLET TWICE DAILY 180 tablet 3  . Ciclopirox 0.77 % gel APPLY ONE TIME DAILY ON THE AFFECTED TOE NAIL. 30 g 0  . furosemide (LASIX) 20 MG tablet TAKE 1 TABLET EVERY DAY 90 tablet 3  . LANTUS 100 UNIT/ML injection INJECT  40 UNITS SUBCUTANEOUSLY TWICE DAILY 70 mL 0  . levothyroxine (SYNTHROID) 50 MCG tablet Take 50 mcg by mouth daily before breakfast.    . losartan (COZAAR) 25 MG tablet TAKE 1 TABLET  EVERY DAY 90 tablet 2  . Multiple Vitamin (MULTIVITAMIN) tablet Take 1 tablet by mouth daily.    Marland Kitchen NOVOLOG 100 UNIT/ML injection INJECT 10 UNITS SUBCUTANEOUSLY THREE TIMES DAILY WITH MEALS (VIAL EXPIRES 28 DAYS AFTER OPENING) 20 mL 0  . omega-3 fish oil (MAXEPA) 1000 MG CAPS capsule Take 1 capsule by mouth daily.    . polyethylene glycol (MIRALAX / GLYCOLAX) packet Take 17 g by mouth daily as needed for mild constipation.     Marland Kitchen spironolactone (ALDACTONE) 25 MG tablet TAKE 1 TABLET EVERY DAY 90 tablet 1   No current facility-administered medications for this visit.    Physical Exam: Vitals:   01/23/21 1533  Weight:  242 lb 3.2 oz (109.9 kg)  Height: 5' 5"  (1.651 m)    GEN- The patient is overweight appearing, alert and oriented x 3 today.   Head- normocephalic, atraumatic Eyes-  Sclera clear, conjunctiva pink Ears- hearing intact Oropharynx- clear Lungs- few bibasilar rales, normal work of breathing Chest- ICD pocket is well healed Heart- Regular rate and rhythm, no murmurs, rubs or gallops, PMI not laterally displaced GI- soft, NT, ND, + BS Extremities- no clubbing, cyanosis, + edema  ICD interrogation- reviewed in detail today,  See PACEART report  Echo 11/21/19- EF 30%, mild LVH   Wt Readings from Last 3 Encounters:  01/23/21 242 lb 3.2 oz (109.9 kg)  12/02/20 241 lb 6.4 oz (109.5 kg)  11/06/20 243 lb (110.2 kg)    Assessment and Plan:  1.  Chronic systolic dysfunction / LBBB euvolemic today No ischemic symptoms EF 30-35%.  CRT optimization performed 11/21/19 with very few options given LV threshold/ PNS Stable on an appropriate medical regimen She did not tolerate entresto due to dizziness Normal ICD function See Pace Art report No changes today she is not device dependant today followed in ICM device clinic Given ongoing NYHA Class III symptoms, I will increase lasix to 13m daily.  In addition, add jardiance 144mdaily Follow-up with EP PA in 3 months.  May need consider BAT Therapy if not improved  2. CAD No ischemic symptoms  3. OSA Uses BiPAP at night  4. HTN Stable No change required today  5. Obesity Body mass index is 40.3 kg/m. Lifestyle modification advised   6. DM Add Jardiance as above She reports frequent low blood sugars (60s-70s) I will therefor reduce novolog to 6u TID (from 10u) Reduce lantus from 40 u BID to 30 u BID I have advised that she follow-up with her PCP in the next few weeks   Risks, benefits and potential toxicities for medications prescribed and/or refilled reviewed with patient today.   Return to see EP PA in 3 months I will see  in a year  JaThompson GrayerD, FASaint Michaels Medical Center/02/2021 3:35 PM

## 2021-01-23 NOTE — Patient Instructions (Addendum)
Medication Instructions:   Begin Jardiance 10mg  daily.  Increase Lasix to 40mg  daily.  Decrease Novolog to 6 units three times per day.  Decrease Lantus to 30 units twice a day.  Continue all other medications.    Labwork:  BMET - orders given today.  Can do the first of next week.   Office will contact with results via phone or letter.    Testing/Procedures: none  Follow-Up:  1 year - Dr. Rayann Heman   3 months - Oda Kilts, [place] office.   Make a follow up with your family doctor in a few weeks.  Any Other Special Instructions Will Be Listed Below (If Applicable).  If you need a refill on your cardiac medications before your next appointment, please call your pharmacy.

## 2021-01-26 MED ORDER — EMPAGLIFLOZIN 10 MG PO TABS
10.0000 mg | ORAL_TABLET | Freq: Every day | ORAL | 0 refills | Status: DC
Start: 2021-01-26 — End: 2023-12-30

## 2021-01-26 NOTE — Addendum Note (Signed)
Addended by: Laurine Blazer on: 01/26/2021 02:30 PM   Modules accepted: Orders

## 2021-01-28 ENCOUNTER — Other Ambulatory Visit: Payer: Self-pay | Admitting: Family Medicine

## 2021-02-09 ENCOUNTER — Ambulatory Visit (INDEPENDENT_AMBULATORY_CARE_PROVIDER_SITE_OTHER): Payer: Medicare HMO

## 2021-02-09 DIAGNOSIS — I255 Ischemic cardiomyopathy: Secondary | ICD-10-CM

## 2021-02-10 LAB — CUP PACEART REMOTE DEVICE CHECK
Battery Remaining Longevity: 55 mo
Battery Remaining Percentage: 70 %
Battery Voltage: 2.98 V
Brady Statistic AP VP Percent: 1 %
Brady Statistic AP VS Percent: 0 %
Brady Statistic AS VP Percent: 98 %
Brady Statistic AS VS Percent: 1 %
Brady Statistic RA Percent Paced: 1 %
Date Time Interrogation Session: 20220523020022
HighPow Impedance: 63 Ohm
HighPow Impedance: 63 Ohm
Implantable Lead Implant Date: 20200813
Implantable Lead Implant Date: 20200813
Implantable Lead Implant Date: 20200813
Implantable Lead Location: 753858
Implantable Lead Location: 753859
Implantable Lead Location: 753860
Implantable Pulse Generator Implant Date: 20200813
Lead Channel Impedance Value: 340 Ohm
Lead Channel Impedance Value: 390 Ohm
Lead Channel Impedance Value: 400 Ohm
Lead Channel Pacing Threshold Amplitude: 0.5 V
Lead Channel Pacing Threshold Amplitude: 0.625 V
Lead Channel Pacing Threshold Amplitude: 1.5 V
Lead Channel Pacing Threshold Pulse Width: 0.4 ms
Lead Channel Pacing Threshold Pulse Width: 0.5 ms
Lead Channel Pacing Threshold Pulse Width: 0.6 ms
Lead Channel Sensing Intrinsic Amplitude: 11.8 mV
Lead Channel Sensing Intrinsic Amplitude: 5 mV
Lead Channel Setting Pacing Amplitude: 2 V
Lead Channel Setting Pacing Amplitude: 2 V
Lead Channel Setting Pacing Amplitude: 2.5 V
Lead Channel Setting Pacing Pulse Width: 0.5 ms
Lead Channel Setting Pacing Pulse Width: 0.6 ms
Lead Channel Setting Sensing Sensitivity: 0.5 mV
Pulse Gen Serial Number: 9891600

## 2021-02-17 ENCOUNTER — Other Ambulatory Visit: Payer: Self-pay

## 2021-02-17 ENCOUNTER — Ambulatory Visit: Payer: Medicare HMO

## 2021-02-17 LAB — HM DIABETES EYE EXAM

## 2021-02-23 ENCOUNTER — Ambulatory Visit (INDEPENDENT_AMBULATORY_CARE_PROVIDER_SITE_OTHER): Payer: Medicare HMO

## 2021-02-23 DIAGNOSIS — I5022 Chronic systolic (congestive) heart failure: Secondary | ICD-10-CM

## 2021-02-23 DIAGNOSIS — Z95810 Presence of automatic (implantable) cardiac defibrillator: Secondary | ICD-10-CM

## 2021-02-25 NOTE — Progress Notes (Signed)
EPIC Encounter for ICM Monitoring  Patient Name: [name] is a 70 y.o. female Date: 02/25/2021 Primary Care Physican: Christina Spar, MD Primary Cardiologist:[name] Electrophysiologist:[name] Bi-V Pacing:98% 6/8/2022Weight:240lbs   Spoke with patient and reports feeling well at this time.  Denies fluid symptoms.     CorvueThoracic impedancenormal.  Prescribed: Furosemide20 mg take 1 tablet by mouth dailyand per Dr [name] 10/08/19 OV note, she may take 40 mg as needed.  Labs: 03/14/2020 Creatinine1.32, BUN33, Potassium5.5, Sodium135, [id number] A complete set of results can be found in Results Review.  Recommendations: No changes and encouraged to call if experiencing any fluid symptoms.  Follow-up plan: ICM clinic phone appointment on7/07/2021. 91 day device clinic remote transmission8/22/2022.   EP/Cardiology Office Visits:8/29/2022with Dr.Branch. 05/04/2021 with Christina Kilts, PA.  Copy of ICM check sent to Dr.Allred.  3 month ICM trend: 02/23/2021.    1 Year ICM trend:       Christina Billings, RN 02/25/2021 3:28 PM

## 2021-03-02 ENCOUNTER — Other Ambulatory Visit: Payer: Self-pay | Admitting: Family Medicine

## 2021-03-03 NOTE — Progress Notes (Signed)
Remote ICD transmission.   

## 2021-03-05 ENCOUNTER — Other Ambulatory Visit: Payer: Self-pay | Admitting: *Deleted

## 2021-03-05 DIAGNOSIS — E11319 Type 2 diabetes mellitus with unspecified diabetic retinopathy without macular edema: Secondary | ICD-10-CM

## 2021-03-10 ENCOUNTER — Other Ambulatory Visit: Payer: Self-pay | Admitting: Cardiology

## 2021-03-30 ENCOUNTER — Ambulatory Visit (INDEPENDENT_AMBULATORY_CARE_PROVIDER_SITE_OTHER): Payer: Medicare HMO

## 2021-03-30 DIAGNOSIS — I5022 Chronic systolic (congestive) heart failure: Secondary | ICD-10-CM | POA: Diagnosis not present

## 2021-03-30 DIAGNOSIS — Z95810 Presence of automatic (implantable) cardiac defibrillator: Secondary | ICD-10-CM | POA: Diagnosis not present

## 2021-04-02 NOTE — Progress Notes (Signed)
EPIC Encounter for ICM Monitoring  Patient Name: [name] is a 70 y.o. female Date: 04/02/2021 Primary Care Physican: Lindell Spar, MD Primary Cardiologist: Branch Electrophysiologist: Allred Bi-V Pacing:  99%        04/02/2021 Weight: 240 lbs                                                            Spoke with patient and heart failure questions reviewed.  Pt reporting she had some dizziness during decreased impedance.   Corvue Thoracic impedance normal but was suggesting possible fluid accumulation 6/9-6/20 and 6/28-7/6.   Prescribed: Furosemide 40 mg take 1 tablet by mouth daily.   Labs: 03/14/2020 Creatinine 1.32, BUN 33, Potassium 5.5, Sodium 135, GFR 40-47 A complete set of results can be found in Results Review.   Recommendations:  No changes and encouraged to call if experiencing any fluid symptoms.   Follow-up plan: ICM clinic phone appointment on 05/12/2021.   91 day device clinic remote transmission 05/11/2021.        EP/Cardiology Office Visits: 05/18/2021 with Dr. Harl Bowie.  05/04/2021 with Oda Kilts, PA.   Copy of ICM check sent to Dr. Rayann Heman.   3 month ICM trend: 03/30/2021.    1 Year ICM trend:       [name], RN 04/02/2021 8:30 AM

## 2021-04-03 ENCOUNTER — Ambulatory Visit: Payer: Medicare HMO | Admitting: Internal Medicine

## 2021-04-20 ENCOUNTER — Other Ambulatory Visit: Payer: Self-pay | Admitting: Internal Medicine

## 2021-05-04 ENCOUNTER — Ambulatory Visit: Payer: Medicare HMO | Admitting: Student

## 2021-05-07 ENCOUNTER — Ambulatory Visit: Payer: Medicare HMO | Admitting: Internal Medicine

## 2021-05-08 ENCOUNTER — Other Ambulatory Visit: Payer: Self-pay

## 2021-05-08 ENCOUNTER — Telehealth (INDEPENDENT_AMBULATORY_CARE_PROVIDER_SITE_OTHER): Payer: Medicare HMO | Admitting: Internal Medicine

## 2021-05-08 ENCOUNTER — Encounter: Payer: Self-pay | Admitting: Internal Medicine

## 2021-05-08 DIAGNOSIS — K529 Noninfective gastroenteritis and colitis, unspecified: Secondary | ICD-10-CM | POA: Diagnosis not present

## 2021-05-08 NOTE — Progress Notes (Signed)
Virtual Visit via Telephone Note   This visit type was conducted due to national recommendations for restrictions regarding the COVID-19 Pandemic (e.g. social distancing) in an effort to limit this patient's exposure and mitigate transmission in our community.  Due to her co-morbid illnesses, this patient is at least at moderate risk for complications without adequate follow up.  This format is felt to be most appropriate for this patient at this time.  The patient did not have access to video technology/had technical difficulties with video requiring transitioning to audio format only (telephone).  All issues noted in this document were discussed and addressed.  No physical exam could be performed with this format.    Evaluation Performed:  Follow-up visit  Date:  05/08/2021   ID:  [name], DOB 08-16-1951, MRN [id number]  Patient Location: Home Provider Location: Office/Clinic  Participants: Patient Location of Patient: Home Location of Provider: Telehealth Consent was obtain for visit to be over via telehealth. I verified that I am speaking with the correct person using two identifiers.  PCP:  Lindell Spar, MD   Chief Complaint:  Diarrhea  History of Present Illness:    [name] is a 70 y.o. female who has a televisit for c/o diarrhea and fever for last 2 days. Fever has resolved now. She states that she has 4-5 episodes of loose BM, but denies any blood in stool. Denies any nausea or vomiting. She reports having a cheeseburger and french fries from a restaurant about a day before her symptoms started. Denies any cough, dyspnea or wheezing.  The patient does not have symptoms concerning for COVID-19 infection (fever, chills, cough, or new shortness of breath).   Past Medical, Surgical, Social History, Allergies, and Medications have been Reviewed.  Past Medical History:  Diagnosis Date   Anemia    Arthritis    CAD (coronary artery disease)    a. LHC (03/2014)  Lmain: nl, LAD: diff dz proximal 20%, 30-40% dz mid vessel prior 2nd diagonal, LCx: 40% in OM1, 20-30% in OM2, RCA: 99% subtotal occlusion @ crux, TIMI 1 flow, L-R collaterals to distal vessel   CHF (congestive heart failure) ([place])    Phreesia 81/09/7508   Chronic systolic heart failure ([place])    a. ECHO (03/2014): EF 25-30%, akinesis enteroanteroseptal myocardium, grade III DD b. RHC (03/2014) RA 4, RV 42/4, PA 44/14 (26), PCWP 21, PA 62% Fick CO/CI 6.9 / 3.4   Diabetes mellitus    Diabetes mellitus without complication ([place])    Phreesia 10/03/2020   Duodenal ulcer    Age 23   Erosive esophagitis    Gastritis    Hypertension    Ischemic cardiomyopathy    Obese    Short-term memory loss    Thyroid disease    TTP (thrombotic thrombocytopenic purpura)    Past Surgical History:  Procedure Laterality Date   BIV ICD INSERTION CRT-D N/A 05/03/2019   Procedure: BIV ICD INSERTION CRT-D;  Surgeon: Thompson Grayer, MD;  Location: [facility];  Service: Cardiovascular;  Laterality: N/A;   COLONOSCOPY  08/29/2012   CHE:NIDPOEU diverticulosis   COLONOSCOPY N/A 05/28/2015   Procedure: COLONOSCOPY;  Surgeon: Daneil Dolin, MD;  [facility];  Service: Endoscopy;  Laterality: N/A;  1015   ECTOPIC PREGNANCY SURGERY     ESOPHAGOGASTRODUODENOSCOPY  04/20/2012   MPN:TIRWERX antral gastritis/Erosive reflux esophagitis   LEFT AND RIGHT HEART CATHETERIZATION WITH CORONARY ANGIOGRAM N/A 03/28/2014   Procedure: LEFT  AND RIGHT HEART CATHETERIZATION WITH CORONARY ANGIOGRAM;  Surgeon: Peter M Martinique, MD;  Location: [facility] CATH LAB;  Service: Cardiovascular;  Laterality: N/A;   SPLENECTOMY, PARTIAL       Current Meds  Medication Sig   acetaminophen (TYLENOL) 500 MG tablet Take 1,000 mg by mouth every 6 (six) hours as needed for moderate pain.   aspirin 81 MG chewable tablet Chew 1 tablet (81 mg total) by mouth daily.   atorvastatin (LIPITOR) 80 MG tablet TAKE 1 TABLET EVERY DAY   blood glucose meter  kit and supplies Dispense based on patient and insurance preference. Use up to four times daily as directed. (FOR ICD-10 E10.9, E11.9).   carvedilol (COREG) 25 MG tablet TAKE 1 TABLET TWICE DAILY   Ciclopirox 0.77 % gel APPLY ONE TIME DAILY ON THE AFFECTED TOE NAIL   empagliflozin (JARDIANCE) 10 MG TABS tablet Take 1 tablet (10 mg total) by mouth daily before breakfast.   furosemide (LASIX) 40 MG tablet Take 1 tablet (40 mg total) by mouth daily.   insulin aspart (NOVOLOG) 100 UNIT/ML injection Inject 6 Units into the skin 3 (three) times daily before meals.   insulin glargine (LANTUS) 100 UNIT/ML injection Inject 0.3 mLs (30 Units total) into the skin 2 (two) times daily.   levothyroxine (SYNTHROID) 50 MCG tablet Take 50 mcg by mouth daily before breakfast.   losartan (COZAAR) 25 MG tablet TAKE 1 TABLET EVERY DAY   Multiple Vitamin (MULTIVITAMIN) tablet Take 1 tablet by mouth daily.   omega-3 fish oil (MAXEPA) 1000 MG CAPS capsule Take 1 capsule by mouth daily.   polyethylene glycol (MIRALAX / GLYCOLAX) packet Take 17 g by mouth daily as needed for mild constipation.    spironolactone (ALDACTONE) 25 MG tablet TAKE 1 TABLET EVERY DAY     Allergies:   Other and Lisinopril   ROS:   Please see the history of present illness.     All other systems reviewed and are negative.   Labs/Other Tests and Data Reviewed:    Recent Labs: No results found for requested labs within last 8760 hours.   Recent Lipid Panel Lab Results  Component Value Date/Time   CHOL 76 03/14/2020 03:24 PM   TRIG 75 03/14/2020 03:24 PM   HDL 33 (L) 03/14/2020 03:24 PM   CHOLHDL 2.3 03/14/2020 03:24 PM   LDLCALC 27 03/14/2020 03:24 PM    Wt Readings from Last 3 Encounters:  01/23/21 242 lb 3.2 oz (109.9 kg)  12/02/20 241 lb 6.4 oz (109.5 kg)  11/06/20 243 lb (110.2 kg)      ASSESSMENT & PLAN:    Acute gastroenteritis Reassured that most of the gastroenteritis are self-resolving Advised to contact I  persistent symptoms for 3 more days Advised to maintain adequate hydration Take food as tolerated, soft food for now Advised to get COVID rapid test.  Time:   Today, I have spent 9 minutes reviewing the chart, including problem list, medications, and with the patient with telehealth technology discussing the above problems.   Medication Adjustments/Labs and Tests Ordered: Current medicines are reviewed at length with the patient today.  Concerns regarding medicines are outlined above.   Tests Ordered: No orders of the defined types were placed in this encounter.   Medication Changes: No orders of the defined types were placed in this encounter.    Note: This dictation was prepared with Dragon dictation along with smaller phrase technology. Similar sounding words can be transcribed inadequately or may not be corrected  upon review. Any transcriptional errors that result from this process are unintentional.      Disposition:  Follow up  Signed, Lindell Spar, MD  05/08/2021 7:00 PM     [place]

## 2021-05-11 ENCOUNTER — Ambulatory Visit (INDEPENDENT_AMBULATORY_CARE_PROVIDER_SITE_OTHER): Payer: Medicare HMO

## 2021-05-11 DIAGNOSIS — I255 Ischemic cardiomyopathy: Secondary | ICD-10-CM

## 2021-05-12 ENCOUNTER — Ambulatory Visit (INDEPENDENT_AMBULATORY_CARE_PROVIDER_SITE_OTHER): Payer: Medicare HMO

## 2021-05-12 DIAGNOSIS — Z95810 Presence of automatic (implantable) cardiac defibrillator: Secondary | ICD-10-CM

## 2021-05-12 DIAGNOSIS — I5022 Chronic systolic (congestive) heart failure: Secondary | ICD-10-CM

## 2021-05-12 LAB — CUP PACEART REMOTE DEVICE CHECK
Battery Remaining Longevity: 43 mo
Battery Remaining Percentage: 65 %
Battery Voltage: 2.96 V
Brady Statistic AP VP Percent: 1 %
Brady Statistic AP VS Percent: 1 %
Brady Statistic AS VP Percent: 99 %
Brady Statistic AS VS Percent: 1 %
Brady Statistic RA Percent Paced: 1 %
Date Time Interrogation Session: 20220822020017
HighPow Impedance: 63 Ohm
HighPow Impedance: 63 Ohm
Implantable Lead Implant Date: 20200813
Implantable Lead Implant Date: 20200813
Implantable Lead Implant Date: 20200813
Implantable Lead Location: 753858
Implantable Lead Location: 753859
Implantable Lead Location: 753860
Implantable Pulse Generator Implant Date: 20200813
Lead Channel Impedance Value: 330 Ohm
Lead Channel Impedance Value: 390 Ohm
Lead Channel Impedance Value: 400 Ohm
Lead Channel Pacing Threshold Amplitude: 0.5 V
Lead Channel Pacing Threshold Amplitude: 0.625 V
Lead Channel Pacing Threshold Amplitude: 1.875 V
Lead Channel Pacing Threshold Pulse Width: 0.4 ms
Lead Channel Pacing Threshold Pulse Width: 0.5 ms
Lead Channel Pacing Threshold Pulse Width: 0.6 ms
Lead Channel Sensing Intrinsic Amplitude: 11.8 mV
Lead Channel Sensing Intrinsic Amplitude: 5 mV
Lead Channel Setting Pacing Amplitude: 2 V
Lead Channel Setting Pacing Amplitude: 2 V
Lead Channel Setting Pacing Amplitude: 2.875
Lead Channel Setting Pacing Pulse Width: 0.5 ms
Lead Channel Setting Pacing Pulse Width: 0.6 ms
Lead Channel Setting Sensing Sensitivity: 0.5 mV
Pulse Gen Serial Number: 9891600

## 2021-05-12 NOTE — Progress Notes (Signed)
EPIC Encounter for ICM Monitoring  Patient Name: [name] is a 70 y.o. female Date: 05/12/2021 Primary Care Physican: Lindell Spar, MD Primary Cardiologist: Branch Electrophysiologist: Allred Bi-V Pacing:  99%        04/02/2021 Weight: 240 lbs                                                            Spoke with patient and heart failure questions reviewed.  She is asymptomatic for fluid accumulation.  Fluid intake was increased at the advice of PCP due to she recently had a stomach infection that has resolved.  She has already started on cutting back on fluid intake.    Corvue Thoracic impedance suggesting possible fluid accumulation starting 8/13.   Prescribed: Furosemide 40 mg take 1 tablet by mouth daily.   Labs: 03/14/2020 Creatinine 1.32, BUN 33, Potassium 5.5, Sodium 135, GFR 40-47 A complete set of results can be found in Results Review.   Recommendations:  Advised to limit fluid intake to 64 oz and any recommendations can be given on 8/24 OV with Jonni Sanger if needed for fluid accumulation.    Follow-up plan: ICM clinic phone appointment on 05/15/2021 (manual) to recheck fluid levels.    91 day device clinic remote transmission 05/11/2021.        EP/Cardiology Office Visits: 05/18/2021 with Dr. Harl Bowie.  05/13/2021 with Oda Kilts, [place].   Copy of ICM check sent to Dr. Rayann Heman and Oda Kilts, PA as Juluis Rainier since patient has OV tomorrow, 8/24.    3 month ICM trend: 05/12/2021.    1 Year ICM trend:       [name], RN 05/12/2021 9:19 AM

## 2021-05-12 NOTE — Progress Notes (Signed)
Electrophysiology Office Note Date: 05/13/2021  ID:  [name], DOB 1951/02/19, MRN [id number]  PCP: Lindell Spar, MD Primary Cardiologist: [name], MD Electrophysiologist: Thompson Grayer, MD   CC: Routine ICD follow-up  [name] is a 70 y.o. female seen today for Thompson Grayer, MD for routine electrophysiology followup.  Since last being seen in our clinic the patient reports doing about the same. She remains dyspnea with mild to moderate exertion. SOB with stairs, inclines, or after about 1.5 rows of a grocery store. Limited by fatigue at times, but nothing specific. she denies chest pain, palpitations, PND, orthopnea, nausea, vomiting, dizziness, syncope, edema, weight gain, or early satiety. She has not had ICD shocks.   Device History: Chemical engineer CRT-D implanted 04/2019 for chronic systolic CHF History of appropriate therapy: No History of AAD therapy: No  Past Medical History:  Diagnosis Date   Anemia    Arthritis    CAD (coronary artery disease)    a. LHC (03/2014) Lmain: nl, LAD: diff dz proximal 20%, 30-40% dz mid vessel prior 2nd diagonal, LCx: 40% in OM1, 20-30% in OM2, RCA: 99% subtotal occlusion @ crux, TIMI 1 flow, L-R collaterals to distal vessel   CHF (congestive heart failure) ([place])    Phreesia 07/62/2633   Chronic systolic heart failure ([place])    a. ECHO (03/2014): EF 25-30%, akinesis enteroanteroseptal myocardium, grade III DD b. RHC (03/2014) RA 4, RV 42/4, PA 44/14 (26), PCWP 21, PA 62% Fick CO/CI 6.9 / 3.4   Diabetes mellitus    Diabetes mellitus without complication ([place])    Phreesia 10/03/2020   Duodenal ulcer    Age 68   Erosive esophagitis    Gastritis    Hypertension    Ischemic cardiomyopathy    Obese    Short-term memory loss    Thyroid disease    TTP (thrombotic thrombocytopenic purpura)    Past Surgical History:  Procedure Laterality Date   BIV ICD INSERTION CRT-D N/A 05/03/2019   Procedure: BIV ICD INSERTION CRT-D;   Surgeon: Thompson Grayer, MD;  Location: [facility];  Service: Cardiovascular;  Laterality: N/A;   COLONOSCOPY  08/29/2012   HLK:TGYBWLS diverticulosis   COLONOSCOPY N/A 05/28/2015   Procedure: COLONOSCOPY;  Surgeon: Daneil Dolin, MD;  [facility];  Service: Endoscopy;  Laterality: N/A;  1015   ECTOPIC PREGNANCY SURGERY     ESOPHAGOGASTRODUODENOSCOPY  04/20/2012   LHT:[id number] antral gastritis/Erosive reflux esophagitis   LEFT AND RIGHT HEART CATHETERIZATION WITH CORONARY ANGIOGRAM N/A 03/28/2014   Procedure: LEFT AND RIGHT HEART CATHETERIZATION WITH CORONARY ANGIOGRAM;  Surgeon: Peter M Martinique, MD;  Location: [facility] CATH LAB;  Service: Cardiovascular;  Laterality: N/A;   SPLENECTOMY, PARTIAL      Current Outpatient Medications  Medication Sig Dispense Refill   acetaminophen (TYLENOL) 500 MG tablet Take 1,000 mg by mouth every 6 (six) hours as needed for moderate pain.     aspirin 81 MG chewable tablet Chew 1 tablet (81 mg total) by mouth daily.     atorvastatin (LIPITOR) 80 MG tablet TAKE 1 TABLET EVERY DAY 90 tablet 1   blood glucose meter kit and supplies Dispense based on patient and insurance preference. Use up to four times daily as directed. (FOR ICD-10 E10.9, E11.9). 1 each 0   carvedilol (COREG) 25 MG tablet TAKE 1 TABLET TWICE DAILY 180 tablet 3   Ciclopirox 0.77 % gel APPLY ONE TIME DAILY ON THE AFFECTED TOE NAIL (Patient taking  differently: as needed. APPLY ONE TIME DAILY ON THE AFFECTED TOE NAIL.) 30 g 0   empagliflozin (JARDIANCE) 10 MG TABS tablet Take 1 tablet (10 mg total) by mouth daily before breakfast. 14 tablet 0   furosemide (LASIX) 40 MG tablet Take 1 tablet (40 mg total) by mouth daily. 30 tablet 6   insulin aspart (NOVOLOG) 100 UNIT/ML injection Inject 6 Units into the skin 3 (three) times daily before meals.     insulin glargine (LANTUS) 100 UNIT/ML injection Inject 0.3 mLs (30 Units total) into the skin 2 (two) times daily.     levothyroxine (SYNTHROID) 50  MCG tablet Take 50 mcg by mouth daily before breakfast.     losartan (COZAAR) 25 MG tablet TAKE 1 TABLET EVERY DAY 90 tablet 2   Multiple Vitamin (MULTIVITAMIN) tablet Take 1 tablet by mouth daily.     omega-3 fish oil (MAXEPA) 1000 MG CAPS capsule Take 1 capsule by mouth daily.     polyethylene glycol (MIRALAX / GLYCOLAX) packet Take 17 g by mouth daily as needed for mild constipation.      spironolactone (ALDACTONE) 25 MG tablet TAKE 1 TABLET EVERY DAY 90 tablet 1   No current facility-administered medications for this visit.    Allergies:   Other and Lisinopril   Social History: Social History   Socioeconomic History   Marital status: Divorced    Spouse name: Not on file   Number of children: 3   Years of education: Not on file   Highest education level: Not on file  Occupational History   Occupation: disabled    Employer: NOT EMPLOYED  Tobacco Use   Smoking status: Former    Packs/day: 0.25    Years: 10.00    Pack years: 2.50    Types: Cigarettes    Start date: 09/20/1966    Quit date: 09/20/1978    Years since quitting: 42.6   Smokeless tobacco: Never  Vaping Use   Vaping Use: Never used  Substance and Sexual Activity   Alcohol use: No    Alcohol/week: 0.0 standard drinks   Drug use: No   Sexual activity: Not on file  Other Topics Concern   Not on file  Social History Narrative   Lives Alone       Social Determinants of Health   Financial Resource Strain: Low Risk    Difficulty of Paying Living Expenses: Not hard at all  Food Insecurity: No Food Insecurity   Worried About Charity fundraiser in the Last Year: Never true   [place] in the Last Year: Never true  Transportation Needs: No Transportation Needs   Lack of Transportation (Medical): No   Lack of Transportation (Non-Medical): No  Physical Activity: Insufficiently Active   Days of Exercise per Week: 2 days   Minutes of Exercise per Session: 20 min  Stress: No Stress Concern Present   Feeling  of Stress : Not at all  Social Connections: Socially Isolated   Frequency of Communication with Friends and Family: Never   Frequency of Social Gatherings with Friends and Family: Never   Attends Religious Services: More than 4 times per year   Active Member of [employer] or Organizations: No   Attends Archivist Meetings: Never   Marital Status: Divorced  Human resources officer Violence: Not At Risk   Fear of Current or Ex-Partner: No   Emotionally Abused: No   Physically Abused: No   Sexually Abused: No    Family  History: Family History  Problem Relation Age of Onset   Heart failure Mother    Cirrhosis Father        ETOH   Diabetes Daughter    Breast cancer Maternal Aunt    Colon cancer Neg Hx     Review of Systems: All other systems reviewed and are otherwise negative except as noted above.   Physical Exam: Vitals:   05/13/21 1144  BP: 116/68  Pulse: 79  SpO2: 97%  Weight: 235 lb (106.6 kg)  Height: _0  (1.651 m)     GEN- The patient is well appearing, alert and oriented x 3 today.   HEENT: normocephalic, atraumatic; sclera clear, conjunctiva pink; hearing intact; oropharynx clear; neck supple, no JVP Lymph- no cervical lymphadenopathy Lungs- Clear to ausculation bilaterally, normal work of breathing.  No wheezes, rales, rhonchi Heart- Regular rate and rhythm, no murmurs, rubs or gallops, PMI not laterally displaced GI- soft, non-tender, non-distended, bowel sounds present, no hepatosplenomegaly Extremities- no clubbing or cyanosis. No edema; DP/PT/radial pulses 2+ bilaterally MS- no significant deformity or atrophy Skin- warm and dry, no rash or lesion; ICD pocket well healed Psych- euthymic mood, full affect Neuro- strength and sensation are intact  ICD interrogation- reviewed in detail today,  See PACEART report  EKG:  EKG is not ordered today.  Recent Labs: No results found for requested labs within last 8760 hours.   Wt Readings from Last 3  Encounters:  05/13/21 235 lb (106.6 kg)  01/23/21 242 lb 3.2 oz (109.9 kg)  12/02/20 241 lb 6.4 oz (109.5 kg)     Other studies Reviewed: Additional studies/ records that were reviewed today include: Previous EP office notes   Assessment and Plan:  1.  Chronic systolic dysfunction s/p St. Jude CRT-D  euvolemic today Stable on an appropriate medical regimen Normal ICD function See Pace Art report No changes today Echo 11/2019 LVEF 30-35%  2. CAD Denies ischemic symptoms  3. OSA Encouraged nightly BiPAP  4. HTN Stable on current regimen  5. Obesity Body mass index is 39.11 kg/m.   6. DM2 Started on Jardiance previously  [name]'s heart failure has failed to improve despite titration of guideline directed medication such that he qualifies for the [facility] NEO device. The following information from the patient's medical record supports the medical necessity of this procedure for my patient, consistent with the FDA on-label indication for BAROSTIM NEO:  ? LVEF of 30-35%  confirmed by Echo on 11/2019   ? NT-proBNP of <1600 pg/ml  = Labs to be drawn today, 05/12/21  ?Symptomatic despite medication management of: diuretic, beta blocker, ACEi/ARB/ARNi, Aldosterone inhibitor, and SGLT2 inhibitor as evidenced by symptoms below.   ? This patients signs and symptoms of heart failure include "dyspnea with mild to moderate exertion, edema, and fatigue   ? NYHA Congestive Heart Failure Classification: III  ? Recent hospitalization for Heart Failure on (not applicable for this patient)   This patient has continued to have NYHA III HF symptoms despite CRT and is able to be considered for BAT.   Current medicines are reviewed at length with the patient today.   The patient does not have concerns regarding her medicines.  The following changes were made today:  none  Labs/ tests ordered today include:  Orders Placed This Encounter  Procedures   Basic metabolic panel   Pro  b natriuretic peptide (BNP)    Disposition:   Follow up with Dr. Rayann Heman  as scheduled in  [place].  Will arranged appropriate follow up sooner if approved for BAT, of which she is very interested.    [name], PA-C  05/13/2021 11:54 AM  [facility] [address] [place] [address] [phone] (office) [phone] (fax)

## 2021-05-13 ENCOUNTER — Encounter: Payer: Self-pay | Admitting: Student

## 2021-05-13 ENCOUNTER — Ambulatory Visit (INDEPENDENT_AMBULATORY_CARE_PROVIDER_SITE_OTHER): Payer: Medicare HMO | Admitting: Student

## 2021-05-13 ENCOUNTER — Other Ambulatory Visit: Payer: Self-pay

## 2021-05-13 VITALS — BP 116/68 | HR 79 | Ht 65.0 in | Wt 235.0 lb

## 2021-05-13 DIAGNOSIS — Z95810 Presence of automatic (implantable) cardiac defibrillator: Secondary | ICD-10-CM

## 2021-05-13 DIAGNOSIS — I5022 Chronic systolic (congestive) heart failure: Secondary | ICD-10-CM | POA: Diagnosis not present

## 2021-05-13 DIAGNOSIS — I255 Ischemic cardiomyopathy: Secondary | ICD-10-CM

## 2021-05-13 DIAGNOSIS — I10 Essential (primary) hypertension: Secondary | ICD-10-CM | POA: Diagnosis not present

## 2021-05-13 LAB — CUP PACEART INCLINIC DEVICE CHECK
Battery Remaining Longevity: 42 mo
Brady Statistic RA Percent Paced: 0 %
Brady Statistic RV Percent Paced: 99 %
Date Time Interrogation Session: 20220824131348
HighPow Impedance: 68.625
Implantable Lead Implant Date: 20200813
Implantable Lead Implant Date: 20200813
Implantable Lead Implant Date: 20200813
Implantable Lead Location: 753858
Implantable Lead Location: 753859
Implantable Lead Location: 753860
Implantable Pulse Generator Implant Date: 20200813
Lead Channel Impedance Value: 387.5 Ohm
Lead Channel Impedance Value: 400 Ohm
Lead Channel Impedance Value: 412.5 Ohm
Lead Channel Pacing Threshold Amplitude: 0.5 V
Lead Channel Pacing Threshold Amplitude: 0.5 V
Lead Channel Pacing Threshold Amplitude: 0.625 V
Lead Channel Pacing Threshold Amplitude: 1.625 V
Lead Channel Pacing Threshold Pulse Width: 0.4 ms
Lead Channel Pacing Threshold Pulse Width: 0.4 ms
Lead Channel Pacing Threshold Pulse Width: 0.5 ms
Lead Channel Pacing Threshold Pulse Width: 0.6 ms
Lead Channel Sensing Intrinsic Amplitude: 11.8 mV
Lead Channel Sensing Intrinsic Amplitude: 5 mV
Lead Channel Setting Pacing Amplitude: 2 V
Lead Channel Setting Pacing Amplitude: 2 V
Lead Channel Setting Pacing Amplitude: 2.625
Lead Channel Setting Pacing Pulse Width: 0.5 ms
Lead Channel Setting Pacing Pulse Width: 0.6 ms
Lead Channel Setting Sensing Sensitivity: 0.5 mV
Pulse Gen Serial Number: 9891600

## 2021-05-13 NOTE — Patient Instructions (Signed)
Medication Instructions:  Your physician recommends that you continue on your current medications as directed. Please refer to the Current Medication list given to you today.  *If you need a refill on your cardiac medications before your next appointment, please call your pharmacy*   Lab Work: TODAY: BMET, BNP  If you have labs (blood work) drawn today and your tests are completely normal, you will receive your results only by: [place] (if you have MyChart) OR A paper copy in the mail If you have any lab test that is abnormal or we need to change your treatment, we will call you to review the results.   Follow-Up: At [facility], you and your health needs are our priority.  As part of our continuing mission to provide you with exceptional heart care, we have created designated Provider Care Teams.  These Care Teams include your primary Cardiologist (physician) and Advanced Practice Providers (APPs -  Physician Assistants and Nurse Practitioners) who all work together to provide you with the care you need, when you need it.  We recommend signing up for the patient portal called "MyChart".  Sign up information is provided on this After Visit Summary.  MyChart is used to connect with patients for Virtual Visits (Telemedicine).  Patients are able to view lab/test results, encounter notes, upcoming appointments, etc.  Non-urgent messages can be sent to your provider as well.   To learn more about what you can do with MyChart, go to [email or web address].    Your next appointment:   6 month(s)  The format for your next appointment:   In Person  Provider:   Legrand Como "Oda Kilts, PA-C

## 2021-05-14 ENCOUNTER — Other Ambulatory Visit: Payer: Self-pay | Admitting: Student

## 2021-05-14 ENCOUNTER — Other Ambulatory Visit: Payer: Self-pay

## 2021-05-14 DIAGNOSIS — I5022 Chronic systolic (congestive) heart failure: Secondary | ICD-10-CM

## 2021-05-14 DIAGNOSIS — Z01818 Encounter for other preprocedural examination: Secondary | ICD-10-CM

## 2021-05-14 LAB — BASIC METABOLIC PANEL
BUN/Creatinine Ratio: 17 (ref 12–28)
BUN: 19 mg/dL (ref 8–27)
CO2: 24 mmol/L (ref 20–29)
Calcium: 8.9 mg/dL (ref 8.7–10.3)
Chloride: 100 mmol/L (ref 96–106)
Creatinine, Ser: 1.09 mg/dL — ABNORMAL HIGH (ref 0.57–1.00)
Glucose: 105 mg/dL — ABNORMAL HIGH (ref 65–99)
Potassium: 4.5 mmol/L (ref 3.5–5.2)
Sodium: 138 mmol/L (ref 134–144)
eGFR: 54 mL/min/{1.73_m2} — ABNORMAL LOW (ref >59)

## 2021-05-14 LAB — PRO B NATRIURETIC PEPTIDE: NT-Pro BNP: 476 pg/mL — ABNORMAL HIGH (ref 0–301)

## 2021-05-15 ENCOUNTER — Ambulatory Visit (INDEPENDENT_AMBULATORY_CARE_PROVIDER_SITE_OTHER): Payer: Medicare HMO

## 2021-05-15 DIAGNOSIS — I5022 Chronic systolic (congestive) heart failure: Secondary | ICD-10-CM

## 2021-05-15 DIAGNOSIS — Z95810 Presence of automatic (implantable) cardiac defibrillator: Secondary | ICD-10-CM

## 2021-05-15 NOTE — Progress Notes (Signed)
EPIC Encounter for ICM Monitoring  Patient Name: [name] is a 70 y.o. female Date: 05/15/2021 Primary Care Physican: Lindell Spar, MD Primary Cardiologist: Branch Electrophysiologist: Allred Bi-V Pacing:  99%        04/02/2021 Weight: 240 lbs 05/15/2021 Weight: 235 lbs                                                            Spoke with patient and heart failure questions reviewed.  She is asymptomatic for fluid accumulation.  Pt had increased fluid levels at the recommendation of PCP due to stomach infection which has now resolved.   Corvue Thoracic impedance suggesting fluid levels returned to normal after decreasing fluid intake to 64 oz daily.   Prescribed: Furosemide 40 mg take 1 tablet by mouth daily.   Labs: 05/13/2021 Creatinine 1.09, BUN 19, Potassium 4.5, Sodium 138, GFR 54, NT-Pro BNP 476 03/14/2020 Creatinine 1.32, BUN 33, Potassium 5.5, Sodium 135, GFR 40-47 A complete set of results can be found in Results Review.   Recommendations:  Advised to limit fluid intake to 64 oz and  call if experiencing any fluid symptoms.   Follow-up plan: ICM clinic phone appointment on 06/22/2021.    91 day device clinic remote transmission 08/10/2021.        EP/Cardiology Office Visits: 05/18/2021 with Dr. Harl Bowie.     Copy of ICM check sent to Dr. Rayann Heman.   3 month ICM trend: 05/15/2021.    1 Year ICM trend:       [name], RN 05/15/2021 9:32 AM

## 2021-05-18 ENCOUNTER — Ambulatory Visit (INDEPENDENT_AMBULATORY_CARE_PROVIDER_SITE_OTHER): Payer: Medicare HMO | Admitting: Cardiology

## 2021-05-18 ENCOUNTER — Other Ambulatory Visit: Payer: Self-pay

## 2021-05-18 ENCOUNTER — Encounter: Payer: Self-pay | Admitting: Cardiology

## 2021-05-18 VITALS — BP 110/62 | HR 88 | Ht 65.0 in | Wt 236.0 lb

## 2021-05-18 DIAGNOSIS — I5022 Chronic systolic (congestive) heart failure: Secondary | ICD-10-CM | POA: Diagnosis not present

## 2021-05-18 DIAGNOSIS — E782 Mixed hyperlipidemia: Secondary | ICD-10-CM | POA: Diagnosis not present

## 2021-05-18 DIAGNOSIS — G4730 Sleep apnea, unspecified: Secondary | ICD-10-CM

## 2021-05-18 DIAGNOSIS — I2510 Atherosclerotic heart disease of native coronary artery without angina pectoris: Secondary | ICD-10-CM | POA: Diagnosis not present

## 2021-05-18 DIAGNOSIS — I10 Essential (primary) hypertension: Secondary | ICD-10-CM | POA: Diagnosis not present

## 2021-05-18 DIAGNOSIS — M79606 Pain in leg, unspecified: Secondary | ICD-10-CM

## 2021-05-18 NOTE — Progress Notes (Signed)
Clinical Summary [name] is a 70 y.o.femaleseen today for follow up of the following meidcal problems.    1. Chronic combined systolic and diastolic heart failure - echo 03/2014 LVEF 25-30%, restrictive diastolic dysfunction. New diagnosis at that time. Repeat echo 06/2014 LVEF 40-45% - cath 03/2014 showed RCA 99% with left to right collaterals, other arteries patent. Overall LV systolic dysfunction out of proportion to CAD.  Medically managed.  - 07/2015 echo LVEF 23%, grade I diastolic dysfunction   - Entresto causes dizziness and nausea, stopped taking. Back on losartan   -  due to low bp's we lowered losartan to 31m daily. Lowered lasix to 256mdaily, may take 4042ms needed.    01/2018 echo LVEF 35%, grade I diastoilc dysfunction  - 11/2018 echo LVEF 30-35% - 05/03/19 had BIV AICD placed by Dr AllRayann Heman3/2021 LVEF 30-35%      - some recent swelling in legs a few weeks ago that has resolved. Home weight stable around 242-246 lbs - compliant with meds   - ongoing SOB at times, overall stable - some LE edema at times. Takes lasix 28m19mily. - home weights usually 235 lbs.     2. CAD - cath 03/2014  with 99% chronic RCA disease, otherwise patent vessels. Managed medically   - no recent chest pains.    3. OSA  - on bipap at night. Followed by Dr TurnRadford Pax 4. Hyperlipidemia -upcoming labs next month 02/2020 TC 76 HDL 33 TG 75 LDL 27 - labs are pending with pcp   5. Chronic LBBB    6. Leg pains - back of thighs and calves with walking, resolves with rest   Past Medical History:  Diagnosis Date   Anemia    Arthritis    CAD (coronary artery disease)    a. LHC (03/2014) Lmain: nl, LAD: diff dz proximal 20%, 30-40% dz mid vessel prior 2nd diagonal, LCx: 40% in OM1, 20-30% in OM2, RCA: 99% subtotal occlusion @ crux, TIMI 1 flow, L-R collaterals to distal vessel   CHF (congestive heart failure) (HCC)[place] Phreesia 01/155/73/2202hronic systolic heart failure (HCC)[place]  a. ECHO (03/2014): EF 25-30%, akinesis enteroanteroseptal myocardium, grade III DD b. RHC (03/2014) RA 4, RV 42/4, PA 44/14 (26), PCWP 21, PA 62% Fick CO/CI 6.9 / 3.4   Diabetes mellitus    Diabetes mellitus without complication (HCC)[place] Phreesia 10/03/2020   Duodenal ulcer    Age 21   Erosive esophagitis    Gastritis    Hypertension    Ischemic cardiomyopathy    Obese    Short-term memory loss    Thyroid disease    TTP (thrombotic thrombocytopenic purpura)      Allergies  Allergen Reactions   Other Other (See Comments)    FLAGYL, CIPRO, PROTONIX taken at the same time caused patient to lose her breath and have trouble breathing, [facility]per patient.    Lisinopril Cough     Current Outpatient Medications  Medication Sig Dispense Refill   acetaminophen (TYLENOL) 500 MG tablet Take 1,000 mg by mouth every 6 (six) hours as needed for moderate pain.     aspirin 81 MG chewable tablet Chew 1 tablet (81 mg total) by mouth daily.     atorvastatin (LIPITOR) 80 MG tablet TAKE 1 TABLET EVERY DAY 90 tablet 1   blood glucose meter kit and supplies Dispense  based on patient and insurance preference. Use up to four times daily as directed. (FOR ICD-10 E10.9, E11.9). 1 each 0   carvedilol (COREG) 25 MG tablet TAKE 1 TABLET TWICE DAILY 180 tablet 3   Ciclopirox 0.77 % gel APPLY ONE TIME DAILY ON THE AFFECTED TOE NAIL (Patient taking differently: as needed. APPLY ONE TIME DAILY ON THE AFFECTED TOE NAIL.) 30 g 0   empagliflozin (JARDIANCE) 10 MG TABS tablet Take 1 tablet (10 mg total) by mouth daily before breakfast. 14 tablet 0   furosemide (LASIX) 40 MG tablet Take 1 tablet (40 mg total) by mouth daily. 30 tablet 6   insulin aspart (NOVOLOG) 100 UNIT/ML injection Inject 6 Units into the skin 3 (three) times daily before meals.     insulin glargine (LANTUS) 100 UNIT/ML injection Inject 0.3 mLs (30 Units total) into the skin 2 (two) times daily.     levothyroxine  (SYNTHROID) 50 MCG tablet Take 50 mcg by mouth daily before breakfast.     losartan (COZAAR) 25 MG tablet TAKE 1 TABLET EVERY DAY 90 tablet 2   Multiple Vitamin (MULTIVITAMIN) tablet Take 1 tablet by mouth daily.     omega-3 fish oil (MAXEPA) 1000 MG CAPS capsule Take 1 capsule by mouth daily.     polyethylene glycol (MIRALAX / GLYCOLAX) packet Take 17 g by mouth daily as needed for mild constipation.      spironolactone (ALDACTONE) 25 MG tablet TAKE 1 TABLET EVERY DAY 90 tablet 1   No current facility-administered medications for this visit.     Past Surgical History:  Procedure Laterality Date   BIV ICD INSERTION CRT-D N/A 05/03/2019   Procedure: BIV ICD INSERTION CRT-D;  Surgeon: Thompson Grayer, MD;  Location: [place] [facility];  Service: Cardiovascular;  Laterality: N/A;   COLONOSCOPY  08/29/2012   LHT:[id number] diverticulosis   COLONOSCOPY N/A 05/28/2015   Procedure: COLONOSCOPY;  Surgeon: Daneil Dolin, MD;  Location[facility];  Service: Endoscopy;  Laterality: N/A;  1015   ECTOPIC PREGNANCY SURGERY     ESOPHAGOGASTRODUODENOSCOPY  04/20/2012   TLX:BWIOMBT antral gastritis/Erosive reflux esophagitis   LEFT AND RIGHT HEART CATHETERIZATION WITH CORONARY ANGIOGRAM N/A 03/28/2014   Procedure: LEFT AND RIGHT HEART CATHETERIZATION WITH CORONARY ANGIOGRAM;  Surgeon: Peter M Martinique, MD;  Location: [facility] CATH LAB;  Service: Cardiovascular;  Laterality: N/A;   SPLENECTOMY, PARTIAL       Allergies  Allergen Reactions   Other Other (See Comments)    FLAGYL, CIPRO, PROTONIX taken at the same time caused patient to lose her breath and have trouble breathing, requiring hospital stay at [facility] 03/23/14-per patient.    Lisinopril Cough      Family History  Problem Relation Age of Onset   Heart failure Mother    Cirrhosis Father        ETOH   Diabetes Daughter    Breast cancer Maternal Aunt    Colon cancer Neg Hx      Social History [name] reports that she quit smoking about 42 years  ago. Her smoking use included cigarettes. She started smoking about 54 years ago. She has a 2.50 pack-year smoking history. She has never used smokeless tobacco. [name] reports no history of alcohol use.   Review of Systems CONSTITUTIONAL: No weight loss, fever, chills, weakness or fatigue.  HEENT: Eyes: No visual loss, blurred vision, double vision or yellow sclerae.No hearing loss, sneezing, congestion, runny nose or sore throat.  SKIN: No rash or itching.  CARDIOVASCULAR: per hpi RESPIRATORY: No shortness of breath, cough or sputum.  GASTROINTESTINAL: No anorexia, nausea, vomiting or diarrhea. No abdominal pain or blood.  GENITOURINARY: No burning on urination, no polyuria NEUROLOGICAL: No headache, dizziness, syncope, paralysis, ataxia, numbness or tingling in the extremities. No change in bowel or bladder control.  MUSCULOSKELETAL: per hpi LYMPHATICS: No enlarged nodes. No history of splenectomy.  PSYCHIATRIC: No history of depression or anxiety.  ENDOCRINOLOGIC: No reports of sweating, cold or heat intolerance. No polyuria or polydipsia.  Marland Kitchen   Physical Examination Today's Vitals   05/18/21 1127  BP: 110/62  Pulse: 88  SpO2: 95%  Weight: 236 lb (107 kg)  Height: _0  (1.651 m)   Body mass index is 39.27 kg/m.  Gen: resting comfortably, no acute distress HEENT: no scleral icterus, pupils equal round and reactive, no palptable cervical adenopathy,  CV: RRR, no m/r/g, no jvd Resp: Clear to auscultation bilaterally GI: abdomen is soft, non-tender, non-distended, normal bowel sounds, no hepatosplenomegaly MSK: extremities are warm, no edema.  Skin: warm, no rash Neuro:  no focal deficits Psych: appropriate affect   Diagnostic Studies  03/2014 echo Study Conclusions  - Left ventricle: The cavity size was normal. There was mild focal basal hypertrophy of the septum. Systolic function was severely reduced. The estimated ejection fraction was in the range of 25% to  30%. There is akinesis of the entireanteroseptal myocardium. Doppler parameters are consistent with a reversible restrictive pattern, indicative of decreased left ventricular diastolic compliance and/or increased left atrial pressure (grade 3 diastolic dysfunction). - Mitral valve: There was mild regurgitation. - Left atrium: The atrium was moderately dilated.  03/2014 Cath Procedural Findings:   Hemodynamics   RA 8/0 mean 4 mm Hg   RV 42/4 mm Hg   PA 44/14 mean 26 mm Hg   PCWP 20/28 mean 21 mm Hg   LV 98/18 mm Hg   AO 95/54 mean 72 mm Hg   Oxygen saturations:   PA 62%   AO 90%   Cardiac Output (Fick) 6.9 L/min   Cardiac Index (Fick) 3.4 L/min/meter squared   Coronary angiography:   Coronary dominance: right   Left mainstem: Normal   Left anterior descending (LAD): Diffuse disease in the proximal vessel up to 20%. 30-40% disease in the mid vessel prior to the second diagonal.   Left circumflex (LCx): 40% in OM1. 20-30% in OM2. Otherwise no significant disease.   Right coronary artery (RCA): 99% subtotal occlusion at the crux. TIMI 1 flow. Left to right collaterals to the distal vessel.   Left ventriculography: Left ventricular systolic function is abnormal, LVEF is estimated at 25%. There is inferior akinesis and severe global hypokinesis. There is mild mitral regurgitation   Final Conclusions:   1. Single vessel obstructive CAD with subtotal occlusion of the RCA at the crux.   2. Severe LV dysfunction.   3. Normal Right heart pressures. Elevated PCWP.   Recommendations: Medical management. Her degree of LV dysfunction is out of proportion to her CAD. She has no anginal symptoms so PCI of RCA not indicated. The inferior wall appears scarred.   07/2015 echo Study Conclusions   - Left ventricle: The cavity size was at the upper limits of   normal. Wall thickness was increased in a pattern of moderate   LVH. The estimated ejection fraction was 40% (similar calculation   per  biplane speckle tracking, reduced global longitudinal strain   of -14.3%). There is akinesis of the basalinferolateral and  inferior myocardium. Doppler parameters are consistent with   abnormal left ventricular relaxation (grade 1 diastolic   dysfunction). - Aortic valve: Mildly calcified annulus. Trileaflet. - Mitral valve: Calcified annulus. There was trivial regurgitation. - Left atrium: The atrium was at the upper limits of normal in   size. - Right atrium: Central venous pressure (est): 3 mm Hg. - Atrial septum: A patent foramen ovale cannot be excluded. - Tricuspid valve: There was trivial regurgitation. - Pulmonary arteries: Systolic pressure could not be accurately   estimated. - Pericardium, extracardiac: There was no pericardial effusion.   Impressions:   - Upper normal LV chamber size with moderate LVH and LVEF   approximately 40% as discussed above. There is akinesis of the   basal inferoseptal and inferior myocardium. Grade 1 diastolic   dysfunction. Compared to the previous study from October 2015,   LVEF is in similar range, perhaps slightly reduced. Upper normal   left atrial chamber size. Trivial tricuspid regurgitation. Cannot   exclude PFO based on limited images.     01/2018 echo Study Conclusions   - Left ventricle: The cavity size was normal. Wall thickness was   increased in a pattern of mild LVH. The estimated ejection   fraction was 35%. There is akinesis of the basal-midinferior   myocardium. There is akinesis of the mid-apicalanteroseptal and   apical myocardium. Doppler parameters are consistent with   abnormal left ventricular relaxation (grade 1 diastolic   dysfunction). - Ventricular septum: Septal motion showed abnormal function and   dyssynergy suggestive of left bundle Ervine Witucki block. - Aortic valve: Moderately calcified annulus. Trileaflet. - Mitral valve: There was mild regurgitation. - Right atrium: Central venous pressure (est): 3 mm  Hg. - Atrial septum: No defect or patent foramen ovale was identified. - Tricuspid valve: There was trivial regurgitation. - Pulmonary arteries: Systolic pressure could not be accurately   estimated. - Pericardium, extracardiac: There was no pericardial effusion     11/2018 echo 1. The left ventricle has moderate-severely reduced systolic function, with an ejection fraction of 30-35%. The cavity size was normal. There is mildly increased left ventricular wall thickness. Left ventricular diastolic Doppler parameters are  consistent with impaired relaxation. Elevated mean left atrial pressure.  2. The anteroseptal, anterolateral, apical walls are hypokinetic.  3. The right ventricle has normal systolic function. The cavity was normal. There is no increase in right ventricular wall thickness.  4. Left atrial size was moderately dilated.  5. No evidence of mitral valve stenosis.  6. The aortic valve is tricuspid no stenosis of the aortic valve.  7. Pulmonary hypertension is indeterminant, inadequate TR jet.  8. The inferior vena cava was dilated in size with <50% respiratory variability.  9. The interatrial septum was not well visualized.   Assessment and Plan  1. Chronic combined systolic/diastolic HF - medical therapy limited by soft bp's. Did not tolerate entresto. She is on her maximally tolerated regimen - s/p BIV AICD  - chronic stable DOE, appears euvolemic today. Continue current meds. She is considering BAT therapy   2. CAD - medically managed single vessel RCA disease - no recent symptoms, continue current meds   3. Hyperlipidemia - requdst pcp labs continue atorvastatin   4. OSA - had been followed by Dr Radford Pax, difficult for patient to get to [facility], looking for local option - we will refer to [facility] to establish care for her OSA   5. Leg pains - symptoms suggestive of claudication, will obtain ABIs  EKG today shows a sensed v pacing.   Arnoldo Lenis, M.D.

## 2021-05-18 NOTE — Patient Instructions (Addendum)
Medication Instructions:  Continue all current medications.   Labwork: none  Testing/Procedures: Your physician has requested that you have an ankle brachial index (ABI). During this test an ultrasound and blood pressure cuff are used to evaluate the arteries that supply the arms and legs with blood. Allow thirty minutes for this exam. There are no restrictions or special instructions. Office will contact with results via phone or letter.     Follow-Up: 4 months   Any Other Special Instructions Will Be Listed Below (If Applicable). [facility] - eval for sleep apnea   If you need a refill on your cardiac medications before your next appointment, please call your pharmacy.

## 2021-05-22 ENCOUNTER — Ambulatory Visit (HOSPITAL_COMMUNITY)
Admission: RE | Admit: 2021-05-22 | Discharge: 2021-05-22 | Disposition: A | Discharge status: Home / Self Care | Payer: Medicare HMO | Source: Ambulatory Visit | Attending: Cardiology | Admitting: Cardiology

## 2021-05-22 ENCOUNTER — Ambulatory Visit (HOSPITAL_COMMUNITY)
Admission: RE | Admit: 2021-05-22 | Discharge: 2021-05-22 | Disposition: A | Discharge status: Home / Self Care | Payer: Medicare HMO | Source: Ambulatory Visit | Attending: Internal Medicine | Admitting: Internal Medicine

## 2021-05-22 ENCOUNTER — Other Ambulatory Visit: Payer: Self-pay

## 2021-05-22 DIAGNOSIS — Z780 Asymptomatic menopausal state: Secondary | ICD-10-CM

## 2021-05-22 DIAGNOSIS — Z13820 Encounter for screening for osteoporosis: Secondary | ICD-10-CM | POA: Diagnosis not present

## 2021-05-22 DIAGNOSIS — M85851 Other specified disorders of bone density and structure, right thigh: Secondary | ICD-10-CM | POA: Diagnosis not present

## 2021-05-22 DIAGNOSIS — Z794 Long term (current) use of insulin: Secondary | ICD-10-CM | POA: Insufficient documentation

## 2021-05-22 DIAGNOSIS — I739 Peripheral vascular disease, unspecified: Secondary | ICD-10-CM | POA: Diagnosis not present

## 2021-05-22 DIAGNOSIS — M79606 Pain in leg, unspecified: Secondary | ICD-10-CM | POA: Insufficient documentation

## 2021-05-22 DIAGNOSIS — E119 Type 2 diabetes mellitus without complications: Secondary | ICD-10-CM | POA: Insufficient documentation

## 2021-05-26 ENCOUNTER — Other Ambulatory Visit: Payer: Self-pay | Admitting: Cardiology

## 2021-05-27 ENCOUNTER — Encounter (HOSPITAL_COMMUNITY): Payer: Medicare HMO

## 2021-05-27 ENCOUNTER — Other Ambulatory Visit: Payer: Self-pay | Admitting: *Deleted

## 2021-05-27 DIAGNOSIS — I6529 Occlusion and stenosis of unspecified carotid artery: Secondary | ICD-10-CM

## 2021-05-27 NOTE — Progress Notes (Signed)
Remote ICD transmission.   

## 2021-06-01 ENCOUNTER — Ambulatory Visit (INDEPENDENT_AMBULATORY_CARE_PROVIDER_SITE_OTHER): Payer: Medicare HMO | Admitting: Surgery

## 2021-06-01 ENCOUNTER — Ambulatory Visit (HOSPITAL_COMMUNITY)
Admission: RE | Admit: 2021-06-01 | Discharge: 2021-06-01 | Disposition: A | Discharge status: Home / Self Care | Payer: Medicare HMO | Source: Ambulatory Visit | Attending: Surgery | Admitting: Surgery

## 2021-06-01 ENCOUNTER — Other Ambulatory Visit: Payer: Self-pay

## 2021-06-01 ENCOUNTER — Encounter: Payer: Self-pay | Admitting: Surgery

## 2021-06-01 VITALS — BP 134/80 | HR 74 | Temp 97.8°F | Resp 20 | Ht 65.0 in | Wt 233.0 lb

## 2021-06-01 DIAGNOSIS — I6529 Occlusion and stenosis of unspecified carotid artery: Secondary | ICD-10-CM | POA: Insufficient documentation

## 2021-06-01 DIAGNOSIS — I5020 Unspecified systolic (congestive) heart failure: Secondary | ICD-10-CM

## 2021-06-01 NOTE — Progress Notes (Addendum)
[facility]  Patient name: [name] MRN: [id number] DOB: 1950-10-25 Sex: female   REQUESTING PROVIDER:   Oda Kilts   REASON FOR CONSULT:    Barostim referral  HISTORY OF PRESENT ILLNESS:   [name] is a 70 y.o. female, who is being referred for evaluation of a Barostim device implant.  The patient suffers from NYHA class III heart failure.  She has a ICD in place.  Her most recent ejection fraction was 30-35%.  She continues to suffer from dyspnea with exertion edema and fatigue.  Patient suffers from coronary artery disease.  Catheterization from 2015 showed a 9 9% RCA stenosis.  She is being medically managed without chest pain.  She is on BiPAP at night for obstructive sleep apnea.  She takes a statin for hypercholesterolemia.  She is a diabetic.  PAST MEDICAL HISTORY    Past Medical History:  Diagnosis Date   Anemia    Arthritis    CAD (coronary artery disease)    a. LHC (03/2014) Lmain: nl, LAD: diff dz proximal 20%, 30-40% dz mid vessel prior 2nd diagonal, LCx: 40% in OM1, 20-30% in OM2, RCA: 99% subtotal occlusion @ crux, TIMI 1 flow, L-R collaterals to distal vessel   CHF (congestive heart failure) ([place])    Phreesia 38/45/3646   Chronic systolic heart failure ([place])    a. ECHO (03/2014): EF 25-30%, akinesis enteroanteroseptal myocardium, grade III DD b. RHC (03/2014) RA 4, RV 42/4, PA 44/14 (26), PCWP 21, PA 62% Fick CO/CI 6.9 / 3.4   Diabetes mellitus    Diabetes mellitus without complication ([place])    Phreesia 10/03/2020   Duodenal ulcer    Age 44   Erosive esophagitis    Gastritis    Hypertension    Ischemic cardiomyopathy    Obese    Short-term memory loss    Thyroid disease    TTP (thrombotic thrombocytopenic purpura)      FAMILY HISTORY   Family History  Problem Relation Age of Onset   Heart failure Mother    Cirrhosis Father        ETOH   Diabetes Daughter    Breast  cancer Maternal Aunt    Colon cancer Neg Hx     SOCIAL HISTORY:   Social History   Socioeconomic History   Marital status: Divorced    Spouse name: Not on file   Number of children: 3   Years of education: Not on file   Highest education level: Not on file  Occupational History   Occupation: disabled    Employer: NOT EMPLOYED  Tobacco Use   Smoking status: Former    Packs/day: 0.25    Years: 10.00    Pack years: 2.50    Types: Cigarettes    Start date: 09/20/1966    Quit date: 09/20/1978    Years since quitting: 42.7   Smokeless tobacco: Never  Vaping Use   Vaping Use: Never used  Substance and Sexual Activity   Alcohol use: No    Alcohol/week: 0.0 standard drinks   Drug use: No   Sexual activity: Not on file  Other Topics Concern   Not on file  Social History Narrative   Lives Alone       Social Determinants of Health   Financial Resource Strain: Low Risk    Difficulty of Paying Living Expenses: Not hard at all  Food Insecurity: No Food Insecurity   Worried About Running Out of  Food in the Last Year: Never true   [place] in the Last Year: Never true  Transportation Needs: No Transportation Needs   Lack of Transportation (Medical): No   Lack of Transportation (Non-Medical): No  Physical Activity: Insufficiently Active   Days of Exercise per Week: 2 days   Minutes of Exercise per Session: 20 min  Stress: No Stress Concern Present   Feeling of Stress : Not at all  Social Connections: Socially Isolated   Frequency of Communication with Friends and Family: Never   Frequency of Social Gatherings with Friends and Family: Never   Attends Religious Services: More than 4 times per year   Active Member of [employer] or Organizations: No   Attends Music therapist: Never   Marital Status: Divorced  Human resources officer Violence: Not At Risk   Fear of Current or Ex-Partner: No   Emotionally Abused: No   Physically Abused: No   Sexually Abused: No     ALLERGIES:    Allergies  Allergen Reactions   Other Other (See Comments)    FLAGYL, CIPRO, PROTONIX taken at the same time caused patient to lose her breath and have trouble breathing, requiring hospital stay at [facility], 03/23/14-per patient.    Lisinopril Cough    CURRENT MEDICATIONS:    Current Outpatient Medications  Medication Sig Dispense Refill   acetaminophen (TYLENOL) 500 MG tablet Take 1,000 mg by mouth every 6 (six) hours as needed for moderate pain.     aspirin 81 MG chewable tablet Chew 1 tablet (81 mg total) by mouth daily.     atorvastatin (LIPITOR) 80 MG tablet TAKE 1 TABLET EVERY DAY 90 tablet 1   blood glucose meter kit and supplies Dispense based on patient and insurance preference. Use up to four times daily as directed. (FOR ICD-10 E10.9, E11.9). 1 each 0   carvedilol (COREG) 25 MG tablet TAKE 1 TABLET TWICE DAILY 180 tablet 3   Ciclopirox 0.77 % gel APPLY ONE TIME DAILY ON THE AFFECTED TOE NAIL (Patient taking differently: as needed. APPLY ONE TIME DAILY ON THE AFFECTED TOE NAIL.) 30 g 0   empagliflozin (JARDIANCE) 10 MG TABS tablet Take 1 tablet (10 mg total) by mouth daily before breakfast. 14 tablet 0   furosemide (LASIX) 20 MG tablet TAKE 1 TABLET EVERY DAY 90 tablet 3   furosemide (LASIX) 40 MG tablet Take 1 tablet (40 mg total) by mouth daily. 30 tablet 6   insulin aspart (NOVOLOG) 100 UNIT/ML injection Inject 6 Units into the skin 3 (three) times daily before meals.     insulin glargine (LANTUS) 100 UNIT/ML injection Inject 0.3 mLs (30 Units total) into the skin 2 (two) times daily.     levothyroxine (SYNTHROID) 50 MCG tablet Take 50 mcg by mouth daily before breakfast.     losartan (COZAAR) 25 MG tablet TAKE 1 TABLET EVERY DAY 90 tablet 2   Multiple Vitamin (MULTIVITAMIN) tablet Take 1 tablet by mouth daily.     omega-3 fish oil (MAXEPA) 1000 MG CAPS capsule Take 1 capsule by mouth daily.     polyethylene glycol (MIRALAX / GLYCOLAX) packet Take 17 g by mouth  daily as needed for mild constipation.      spironolactone (ALDACTONE) 25 MG tablet TAKE 1 TABLET EVERY DAY 90 tablet 1   No current facility-administered medications for this visit.    REVIEW OF SYSTEMS:   _0  denotes positive finding, _1  denotes negative finding Cardiac  Comments:  Chest  pain or chest pressure:    Shortness of breath upon exertion: x   Short of breath when lying flat:    Irregular heart rhythm:        Vascular    Pain in calf, thigh, or hip brought on by ambulation:    Pain in feet at night that wakes you up from your sleep:     Blood clot in your veins:    Leg swelling:         Pulmonary    Oxygen at home:    Productive cough:     Wheezing:         Neurologic    Sudden weakness in arms or legs:     Sudden numbness in arms or legs:     Sudden onset of difficulty speaking or slurred speech:    Temporary loss of vision in one eye:     Problems with dizziness:         Gastrointestinal    Blood in stool:      Vomited blood:         Genitourinary    Burning when urinating:     Blood in urine:        Psychiatric    Major depression:         Hematologic    Bleeding problems:    Problems with blood clotting too easily:        Skin    Rashes or ulcers:        Constitutional    Fever or chills:     PHYSICAL EXAM:   Vitals:   06/01/21 1441  BP: 134/80  Pulse: 74  Resp: 20  Temp: 97.8 F (36.6 C)  SpO2: 94%  Weight: 233 lb (105.7 kg)  Height: _0  (1.651 m)    GENERAL: The patient is a well-nourished female, in no acute distress. The vital signs are documented above. CARDIAC: There is a regular rate and rhythm.  VASCULAR: SonoSite was used to evaluate the carotid bifurcation which was in the mid neck PULMONARY: Nonlabored respirations ABDOMEN: Soft and non-tender with normal pitched bowel sounds.  MUSCULOSKELETAL: There are no major deformities or cyanosis. NEUROLOGIC: No focal weakness or paresthesias are detected. SKIN: There are  no ulcers or rashes noted. PSYCHIATRIC: The patient has a normal affect.  STUDIES:  I have reviewed his carotid Doppler studies with the following results:  Right Carotid: There is no evidence of stenosis in the right ICA.   Left Carotid: There is no evidence of stenosis in the left ICA.   Vertebrals:  Bilateral vertebral arteries demonstrate antegrade flow.  Subclavians: Normal flow hemodynamics were seen in bilateral subclavian               arteries.   ASSESSMENT and PLAN   NYHA class III heart failure: The patient is an excellent candidate for aBarostim device implant.  I discussed the details of the procedure as well as the risks and benefits.  All her questions were answered.  A pending insurance approval, this will be scheduled in the near future.   Leia Alf, MD, FACS [facility] [phone] Pager [phone]

## 2021-06-02 ENCOUNTER — Other Ambulatory Visit: Payer: Self-pay | Admitting: Family Medicine

## 2021-06-12 ENCOUNTER — Telehealth: Payer: Self-pay | Admitting: *Deleted

## 2021-06-12 NOTE — Telephone Encounter (Signed)
Laurine Blazer, LPN  4/49/7530  0:51 AM EDT Back to Top    Notified, copy to pcp.  Message has been sent to [facility]

## 2021-06-12 NOTE — Telephone Encounter (Signed)
-----   Message from Arnoldo Lenis, MD sent at 05/31/2021  8:43 AM EDT ----- Mixed findings on circulation testing, I see that she is seeing vacular surgery and would recommend she discuss the results with them to see if any additional testing is warranted  Zandra Abts MD

## 2021-06-15 ENCOUNTER — Other Ambulatory Visit: Payer: Self-pay | Admitting: *Deleted

## 2021-06-15 DIAGNOSIS — R6889 Other general symptoms and signs: Secondary | ICD-10-CM

## 2021-06-18 ENCOUNTER — Other Ambulatory Visit: Payer: Self-pay

## 2021-06-22 ENCOUNTER — Ambulatory Visit (INDEPENDENT_AMBULATORY_CARE_PROVIDER_SITE_OTHER): Payer: Medicare HMO

## 2021-06-22 DIAGNOSIS — I5022 Chronic systolic (congestive) heart failure: Secondary | ICD-10-CM | POA: Diagnosis not present

## 2021-06-22 DIAGNOSIS — Z95810 Presence of automatic (implantable) cardiac defibrillator: Secondary | ICD-10-CM | POA: Diagnosis not present

## 2021-06-23 ENCOUNTER — Telehealth: Payer: Self-pay | Admitting: Internal Medicine

## 2021-06-23 NOTE — Telephone Encounter (Signed)
Patient called in with question regarding a CNA / home health for upcoming surgery. Would like to speak with someone about options

## 2021-06-24 NOTE — Progress Notes (Signed)
EPIC Encounter for ICM Monitoring  Patient Name: [name] is a 70 y.o. female Date: 06/24/2021 Primary Care Physican: Lindell Spar, MD Primary Cardiologist: Branch Electrophysiologist: Allred Bi-V Pacing:  99%        06/24/2021 Weight: 230 lbs                                                            Spoke with patient and heart failure questions reviewed.  She is asymptomatic for fluid accumulation.  Barostim procedure scheduled 11/2   [facility] Thoracic impedance suggesting fluid levels returned to normal after decreasing fluid intake to 64 oz daily.   Prescribed: Furosemide 40 mg take 1 tablet by mouth daily.   Labs: 05/13/2021 Creatinine 1.09, BUN 19, Potassium 4.5, Sodium 138, GFR 54, NT-Pro BNP 476 03/14/2020 Creatinine 1.32, BUN 33, Potassium 5.5, Sodium 135, GFR 40-47 A complete set of results can be found in Results Review.   Recommendations:  Advised to limit fluid intake to 64 oz and  call if experiencing any fluid symptoms.   Follow-up plan: ICM clinic phone appointment on 07/27/2021.    91 day device clinic remote transmission 08/10/2021.        EP/Cardiology Office Visits: 05/18/2021 with Dr. Harl Bowie.     Copy of ICM check sent to Dr. Rayann Heman.    3 month ICM trend: 06/20/2021.    [name], RN 06/24/2021 3:18 PM

## 2021-06-30 ENCOUNTER — Telehealth: Payer: Self-pay

## 2021-06-30 NOTE — Telephone Encounter (Signed)
Rosie from [facility] called ask to contact patient at [phone] about upcoming appt. I called  lvm for patinet to call office back to schedule an appointment with Dr Posey Pronto.

## 2021-07-14 NOTE — Pre-Procedure Instructions (Signed)
Surgical Instructions    Your procedure is scheduled on Wednesday 07/22/21.   Report to [facility] Main Entrance "A" at 09:05 A.M., then check in with the Admitting office.  Call this number if you have problems the morning of surgery:  [phone]   If you have any questions prior to your surgery date call [phone]: Open Monday-Friday 8am-4pm    Remember:  Do not eat or drink after midnight the night before your surgery    Take these medicines the morning of surgery with A SIP OF WATER   atorvastatin (LIPITOR)  carvedilol (COREG)  levothyroxine (SYNTHROID)   Take these medicines if needed:   acetaminophen (TYLENOL)  Please follow your surgeon's instructions regarding Aspirin. If you have not received instructions then you need to contact your surgeon's office for instructions.   As of today, STOP taking Aleve, Naproxen, Ibuprofen, Motrin, Advil, Goody's, BC's, all herbal medications, fish oil, and all vitamins.  WHAT DO I DO ABOUT MY DIABETES MEDICATION?   Do not take oral diabetes medicines (pills) the morning of surgery.  DO NOT TAKE empagliflozin (JARDIANCE) the day before surgery (07/21/21) or the morning of surgery (07/22/21).  DO NOT TAKE insulin aspart (NOVOLOG) the morning of surgery (07/22/21).  The evening before surgery take _____ units of Lantus insulin.   The morning of surgery take _____ units of LANTUS insulin.    The day of surgery, do not take other diabetes injectables, including Byetta (exenatide), Bydureon (exenatide ER), Victoza (liraglutide), or Trulicity (dulaglutide).  If your CBG is greater than 220 mg/dL, you may take  of your sliding scale (correction) dose of insulin.   HOW TO MANAGE YOUR DIABETES BEFORE AND AFTER SURGERY  Why is it important to control my blood sugar before and after surgery? Improving blood sugar levels before and after surgery helps healing and can limit problems. A way of improving blood sugar control is eating a  healthy diet by:  Eating less sugar and carbohydrates  Increasing activity/exercise  Talking with your doctor about reaching your blood sugar goals High blood sugars (greater than 180 mg/dL) can raise your risk of infections and slow your recovery, so you will need to focus on controlling your diabetes during the weeks before surgery. Make sure that the doctor who takes care of your diabetes knows about your planned surgery including the date and location.  How do I manage my blood sugar before surgery? Check your blood sugar at least 4 times a day, starting 2 days before surgery, to make sure that the level is not too high or low.  Check your blood sugar the morning of your surgery when you wake up and every 2 hours until you get to the Short Stay unit.  If your blood sugar is less than 70 mg/dL, you will need to treat for low blood sugar: Do not take insulin. Treat a low blood sugar (less than 70 mg/dL) with  cup of clear juice (cranberry or apple), 4 glucose tablets, OR glucose gel. Recheck blood sugar in 15 minutes after treatment (to make sure it is greater than 70 mg/dL). If your blood sugar is not greater than 70 mg/dL on recheck, call [phone] for further instructions. Report your blood sugar to the short stay nurse when you get to Short Stay.  If you are admitted to the hospital after surgery: Your blood sugar will be checked by the staff and you will probably be given insulin after surgery (instead of oral diabetes medicines) to make  sure you have good blood sugar levels. The goal for blood sugar control after surgery is 80-180 mg/dL.                      Do NOT Smoke (Tobacco/Vaping) or drink Alcohol 24 hours prior to your procedure.  If you use a CPAP at night, you may bring all equipment for your overnight stay.   Contacts, glasses, piercing's, hearing aid's, dentures or partials may not be worn into surgery, please bring cases for these belongings.    For patients  admitted to the hospital, discharge time will be determined by your treatment team.   Patients discharged the day of surgery will not be allowed to drive home, and someone needs to stay with them for 24 hours.  NO VISITORS WILL BE ALLOWED IN PRE-OP WHERE PATIENTS GET READY FOR SURGERY.  ONLY 1 SUPPORT PERSON MAY BE PRESENT IN THE WAITING ROOM WHILE YOU ARE IN SURGERY.  IF YOU ARE TO BE ADMITTED, ONCE YOU ARE IN YOUR ROOM YOU WILL BE ALLOWED TWO (2) VISITORS.  Minor children may have two parents present. Special consideration for safety and communication needs will be reviewed on a case by case basis.   Special instructions:   [facility]- Preparing For Surgery  Before surgery, you can play an important role. Because skin is not sterile, your skin needs to be as free of germs as possible. You can reduce the number of germs on your skin by washing with CHG (chlorahexidine gluconate) Soap before surgery.  CHG is an antiseptic cleaner which kills germs and bonds with the skin to continue killing germs even after washing.    Oral Hygiene is also important to reduce your risk of infection.  Remember - BRUSH YOUR TEETH THE MORNING OF SURGERY WITH YOUR REGULAR TOOTHPASTE  Please do not use if you have an allergy to CHG or antibacterial soaps. If your skin becomes reddened/irritated stop using the CHG.  Do not shave (including legs and underarms) for at least 48 hours prior to first CHG shower. It is OK to shave your face.  Please follow these instructions carefully.   Shower the NIGHT BEFORE SURGERY and the MORNING OF SURGERY  If you chose to wash your hair, wash your hair first as usual with your normal shampoo.  After you shampoo, rinse your hair and body thoroughly to remove the shampoo.  Use CHG Soap as you would any other liquid soap. You can apply CHG directly to the skin and wash gently with a scrungie or a clean washcloth.   Apply the CHG Soap to your body ONLY FROM THE NECK DOWN.  Do not  use on open wounds or open sores. Avoid contact with your eyes, ears, mouth and genitals (private parts). Wash Face and genitals (private parts)  with your normal soap.   Wash thoroughly, paying special attention to the area where your surgery will be performed.  Thoroughly rinse your body with warm water from the neck down.  DO NOT shower/wash with your normal soap after using and rinsing off the CHG Soap.  Pat yourself dry with a CLEAN TOWEL.  Wear CLEAN PAJAMAS to bed the night before surgery  Place CLEAN SHEETS on your bed the night before your surgery  DO NOT SLEEP WITH PETS.   Day of Surgery: Shower with CHG soap. Do not wear jewelry, make up, nail polish, gel polish, artificial nails, or any other type of covering on natural nails including  finger and toenails. If patients have artificial nails, gel coating, etc. that need to be removed by a nail salon please have this removed prior to surgery. Surgery may need to be canceled/delayed if the surgeon/ anesthesia feels like the patient is unable to be adequately monitored. Do not wear lotions, powders, perfumes/colognes, or deodorant. Do not shave 48 hours prior to surgery.  Men may shave face and neck. Do not bring valuables to the hospital. [facility] is not responsible for any belongings or valuables. Wear Clean/Comfortable clothing the morning of surgery Remember to brush your teeth WITH YOUR REGULAR TOOTHPASTE.   Please read over the following fact sheets that you were given.   3 days prior to your procedure or After your COVID test   You are not required to quarantine however you are required to wear a well-fitting mask when you are out and around people not in your household. If your mask becomes wet or soiled, replace with a new one.   Wash your hands often with soap and water for 20 seconds or clean your hands with an alcohol-based hand sanitizer that contains at least 60% alcohol.   Do not share personal  items.   Notify your provider:  o if you are in close contact with someone who has COVID  o or if you develop a fever of 100.4 or greater, sneezing, cough, sore throat, shortness of breath or body aches.

## 2021-07-15 ENCOUNTER — Encounter (HOSPITAL_COMMUNITY)
Admission: RE | Admit: 2021-07-15 | Discharge: 2021-07-15 | Disposition: A | Discharge status: Home / Self Care | Payer: Medicare HMO | Source: Ambulatory Visit | Attending: Surgery | Admitting: Surgery

## 2021-07-15 ENCOUNTER — Encounter (HOSPITAL_COMMUNITY): Payer: Self-pay

## 2021-07-15 ENCOUNTER — Other Ambulatory Visit: Payer: Self-pay

## 2021-07-15 ENCOUNTER — Encounter: Payer: Self-pay | Admitting: Internal Medicine

## 2021-07-15 VITALS — BP 128/69 | HR 88 | Temp 98.3°F | Resp 18 | Ht 65.0 in | Wt 231.0 lb

## 2021-07-15 DIAGNOSIS — I110 Hypertensive heart disease with heart failure: Secondary | ICD-10-CM | POA: Diagnosis not present

## 2021-07-15 DIAGNOSIS — E039 Hypothyroidism, unspecified: Secondary | ICD-10-CM | POA: Insufficient documentation

## 2021-07-15 DIAGNOSIS — I429 Cardiomyopathy, unspecified: Secondary | ICD-10-CM | POA: Insufficient documentation

## 2021-07-15 DIAGNOSIS — Z7982 Long term (current) use of aspirin: Secondary | ICD-10-CM | POA: Insufficient documentation

## 2021-07-15 DIAGNOSIS — Z01812 Encounter for preprocedural laboratory examination: Secondary | ICD-10-CM | POA: Diagnosis not present

## 2021-07-15 DIAGNOSIS — G4733 Obstructive sleep apnea (adult) (pediatric): Secondary | ICD-10-CM | POA: Diagnosis not present

## 2021-07-15 DIAGNOSIS — I5042 Chronic combined systolic (congestive) and diastolic (congestive) heart failure: Secondary | ICD-10-CM | POA: Diagnosis not present

## 2021-07-15 DIAGNOSIS — Z79899 Other long term (current) drug therapy: Secondary | ICD-10-CM | POA: Insufficient documentation

## 2021-07-15 DIAGNOSIS — I2510 Atherosclerotic heart disease of native coronary artery without angina pectoris: Secondary | ICD-10-CM | POA: Insufficient documentation

## 2021-07-15 DIAGNOSIS — Z01818 Encounter for other preprocedural examination: Secondary | ICD-10-CM

## 2021-07-15 DIAGNOSIS — Z95810 Presence of automatic (implantable) cardiac defibrillator: Secondary | ICD-10-CM | POA: Diagnosis not present

## 2021-07-15 HISTORY — DX: Hypothyroidism, unspecified: E03.9

## 2021-07-15 HISTORY — DX: Dyspnea, unspecified: R06.00

## 2021-07-15 LAB — URINALYSIS, ROUTINE W REFLEX MICROSCOPIC
Bilirubin Urine: NEGATIVE
Glucose, UA: NEGATIVE mg/dL
Hgb urine dipstick: NEGATIVE
Ketones, ur: NEGATIVE mg/dL
Leukocytes,Ua: NEGATIVE
Nitrite: NEGATIVE
Protein, ur: NEGATIVE mg/dL
Specific Gravity, Urine: 1.017 (ref 1.005–1.030)
pH: 5 (ref 5.0–8.0)

## 2021-07-15 LAB — COMPREHENSIVE METABOLIC PANEL
ALT: 39 U/L (ref 0–44)
AST: 25 U/L (ref 15–41)
Albumin: 3.4 g/dL — ABNORMAL LOW (ref 3.5–5.0)
Alkaline Phosphatase: 73 U/L (ref 38–126)
Anion gap: 5 (ref 5–15)
BUN: 20 mg/dL (ref 8–23)
CO2: 28 mmol/L (ref 22–32)
Calcium: 9 mg/dL (ref 8.9–10.3)
Chloride: 105 mmol/L (ref 98–111)
Creatinine, Ser: 1.13 mg/dL — ABNORMAL HIGH (ref 0.44–1.00)
GFR, Estimated: 52 mL/min — ABNORMAL LOW (ref >60)
Glucose, Bld: 123 mg/dL — ABNORMAL HIGH (ref 70–99)
Potassium: 4 mmol/L (ref 3.5–5.1)
Sodium: 138 mmol/L (ref 135–145)
Total Bilirubin: 0.9 mg/dL (ref 0.3–1.2)
Total Protein: 8 g/dL (ref 6.5–8.1)

## 2021-07-15 LAB — CBC
HCT: 34.2 % — ABNORMAL LOW (ref 36.0–46.0)
Hemoglobin: 11 g/dL — ABNORMAL LOW (ref 12.0–15.0)
MCH: 28.1 pg (ref 26.0–34.0)
MCHC: 32.2 g/dL (ref 30.0–36.0)
MCV: 87.2 fL (ref 80.0–100.0)
Platelets: 150 10*3/uL (ref 150–400)
RBC: 3.92 MIL/uL (ref 3.87–5.11)
RDW: 17.7 % — ABNORMAL HIGH (ref 11.5–15.5)
WBC: 5.5 10*3/uL (ref 4.0–10.5)
nRBC: 0 % (ref 0.0–0.2)

## 2021-07-15 LAB — PROTIME-INR
INR: 1.2 (ref 0.8–1.2)
Prothrombin Time: 15 seconds (ref 11.4–15.2)

## 2021-07-15 LAB — TYPE AND SCREEN
ABO/RH(D): A POS
Antibody Screen: NEGATIVE

## 2021-07-15 LAB — APTT: aPTT: 26 seconds (ref 24–36)

## 2021-07-15 LAB — SURGICAL PCR SCREEN
MRSA, PCR: NEGATIVE
Staphylococcus aureus: NEGATIVE

## 2021-07-15 LAB — GLUCOSE, CAPILLARY: Glucose-Capillary: 122 mg/dL — ABNORMAL HIGH (ref 70–99)

## 2021-07-15 NOTE — Progress Notes (Signed)
PERIOPERATIVE PRESCRIPTION FOR IMPLANTED CARDIAC DEVICE PROGRAMMING  Patient Information: Name:  [name]  DOB:  11/02/1950  MRN:  [id number]    Tami Lin, RN  P Cv Div Heartcare Device Cc: Clayborn Bigness, RN Planned Procedure:  Right Barostim Insertion  Surgeon:  Dr. Orvan Falconer  Date of Procedure:  07/22/21  Cautery will be used.  Position during surgery:  Supine   Please send documentation back to:  [facility] (Fax # [phone])   Karmen Bongo, RN  07/14/2021 12:12 PM  Device Information:  Clinic EP Physician:  Thompson Grayer, MD   Device Type:  Defibrillator Manufacturer and Phone #:  [employer]: [phone] Pacemaker Dependent?:  No. Date of Last Device Check:  06/09/21 (remote) 05/13/21 (in-clinic) Normal Device Function?:  Yes.    Electrophysiologist's Recommendations:  Have magnet available. Provide continuous ECG monitoring when magnet is used or reprogramming is to be performed.  Procedure will likely interfere with device function.  Device should be programmed:  Tachy therapies disabled [employer] rep should be present for device management  Per Device Clinic Standing Orders, York Ram, RN  10:34 AM 07/15/2021

## 2021-07-15 NOTE — Pre-Procedure Instructions (Signed)
Surgical Instructions    Your procedure is scheduled on Wednesday 07/22/21.   Report to [facility] Main Entrance "A" at 09:05 A.M., then check in with the Admitting office.  Call this number if you have problems the morning of surgery:  [phone]   If you have any questions prior to your surgery date call [phone]: Open Monday-Friday 8am-4pm    Remember:  Do not eat or drink after midnight the night before your surgery    Take these medicines the morning of surgery with A SIP OF WATER   atorvastatin (LIPITOR)  carvedilol (COREG)  levothyroxine (SYNTHROID)   Take these medicines if needed:   acetaminophen (TYLENOL)  Please follow your surgeon's instructions regarding Aspirin. If you have not received instructions then you need to contact your surgeon's office for instructions.   As of today, STOP taking Aleve, Naproxen, Ibuprofen, Motrin, Advil, Goody's, BC's, all herbal medications, fish oil, and all vitamins.  WHAT DO I DO ABOUT MY DIABETES MEDICATION?   Do not take oral diabetes medicines (pills) the morning of surgery.  DO NOT TAKE empagliflozin (JARDIANCE) the day before surgery (07/21/21) or the morning of surgery (07/22/21).  DO NOT TAKE insulin aspart (NOVOLOG) the morning of surgery (07/22/21).  The evening before surgery take 15 units of Lantus insulin.   The morning of surgery take 15 units of LANTUS insulin.    The day of surgery, do not take other diabetes injectables, including Byetta (exenatide), Bydureon (exenatide ER), Victoza (liraglutide), or Trulicity (dulaglutide).  If your CBG is greater than 220 mg/dL, you may take  of your sliding scale (correction) dose of insulin.   HOW TO MANAGE YOUR DIABETES BEFORE AND AFTER SURGERY  Why is it important to control my blood sugar before and after surgery? Improving blood sugar levels before and after surgery helps healing and can limit problems. A way of improving blood sugar control is eating a healthy  diet by:  Eating less sugar and carbohydrates  Increasing activity/exercise  Talking with your doctor about reaching your blood sugar goals High blood sugars (greater than 180 mg/dL) can raise your risk of infections and slow your recovery, so you will need to focus on controlling your diabetes during the weeks before surgery. Make sure that the doctor who takes care of your diabetes knows about your planned surgery including the date and location.  How do I manage my blood sugar before surgery? Check your blood sugar at least 4 times a day, starting 2 days before surgery, to make sure that the level is not too high or low.  Check your blood sugar the morning of your surgery when you wake up and every 2 hours until you get to the Short Stay unit.  If your blood sugar is less than 70 mg/dL, you will need to treat for low blood sugar: Do not take insulin. Treat a low blood sugar (less than 70 mg/dL) with  cup of clear juice (cranberry or apple), 4 glucose tablets, OR glucose gel. Recheck blood sugar in 15 minutes after treatment (to make sure it is greater than 70 mg/dL). If your blood sugar is not greater than 70 mg/dL on recheck, call [phone] for further instructions. Report your blood sugar to the short stay nurse when you get to Short Stay.  If you are admitted to the hospital after surgery: Your blood sugar will be checked by the staff and you will probably be given insulin after surgery (instead of oral diabetes medicines) to make  sure you have good blood sugar levels. The goal for blood sugar control after surgery is 80-180 mg/dL.                      Do NOT Smoke (Tobacco/Vaping) or drink Alcohol 24 hours prior to your procedure.  If you use a CPAP at night, you may bring all equipment for your overnight stay.   Contacts, glasses, piercing's, hearing aid's, dentures or partials may not be worn into surgery, please bring cases for these belongings.    For patients admitted to  the hospital, discharge time will be determined by your treatment team.   Patients discharged the day of surgery will not be allowed to drive home, and someone needs to stay with them for 24 hours.  NO VISITORS WILL BE ALLOWED IN PRE-OP WHERE PATIENTS GET READY FOR SURGERY.  ONLY 1 SUPPORT PERSON MAY BE PRESENT IN THE WAITING ROOM WHILE YOU ARE IN SURGERY.  IF YOU ARE TO BE ADMITTED, ONCE YOU ARE IN YOUR ROOM YOU WILL BE ALLOWED TWO (2) VISITORS.  Minor children may have two parents present. Special consideration for safety and communication needs will be reviewed on a case by case basis.   Special instructions:   [facility]- Preparing For Surgery  Before surgery, you can play an important role. Because skin is not sterile, your skin needs to be as free of germs as possible. You can reduce the number of germs on your skin by washing with CHG (chlorahexidine gluconate) Soap before surgery.  CHG is an antiseptic cleaner which kills germs and bonds with the skin to continue killing germs even after washing.    Oral Hygiene is also important to reduce your risk of infection.  Remember - BRUSH YOUR TEETH THE MORNING OF SURGERY WITH YOUR REGULAR TOOTHPASTE  Please do not use if you have an allergy to CHG or antibacterial soaps. If your skin becomes reddened/irritated stop using the CHG.  Do not shave (including legs and underarms) for at least 48 hours prior to first CHG shower. It is OK to shave your face.  Please follow these instructions carefully.   Shower the NIGHT BEFORE SURGERY and the MORNING OF SURGERY  If you chose to wash your hair, wash your hair first as usual with your normal shampoo.  After you shampoo, rinse your hair and body thoroughly to remove the shampoo.  Use CHG Soap as you would any other liquid soap. You can apply CHG directly to the skin and wash gently with a scrungie or a clean washcloth.   Apply the CHG Soap to your body ONLY FROM THE NECK DOWN.  Do not use on open  wounds or open sores. Avoid contact with your eyes, ears, mouth and genitals (private parts). Wash Face and genitals (private parts)  with your normal soap.   Wash thoroughly, paying special attention to the area where your surgery will be performed.  Thoroughly rinse your body with warm water from the neck down.  DO NOT shower/wash with your normal soap after using and rinsing off the CHG Soap.  Pat yourself dry with a CLEAN TOWEL.  Wear CLEAN PAJAMAS to bed the night before surgery  Place CLEAN SHEETS on your bed the night before your surgery  DO NOT SLEEP WITH PETS.   Day of Surgery: Shower with CHG soap. Do not wear jewelry, make up, nail polish, gel polish, artificial nails, or any other type of covering on natural nails including  finger and toenails. If patients have artificial nails, gel coating, etc. that need to be removed by a nail salon please have this removed prior to surgery. Surgery may need to be canceled/delayed if the surgeon/ anesthesia feels like the patient is unable to be adequately monitored. Do not wear lotions, powders, perfumes/colognes, or deodorant. Do not shave 48 hours prior to surgery.  Men may shave face and neck. Do not bring valuables to the hospital. [facility] is not responsible for any belongings or valuables. Wear Clean/Comfortable clothing the morning of surgery Remember to brush your teeth WITH YOUR REGULAR TOOTHPASTE.   Please read over the following fact sheets that you were given.   3 days prior to your procedure or After your COVID test   You are not required to quarantine however you are required to wear a well-fitting mask when you are out and around people not in your household. If your mask becomes wet or soiled, replace with a new one.   Wash your hands often with soap and water for 20 seconds or clean your hands with an alcohol-based hand sanitizer that contains at least 60% alcohol.   Do not share personal items.   Notify  your provider:  o if you are in close contact with someone who has COVID  o or if you develop a fever of 100.4 or greater, sneezing, cough, sore throat, shortness of breath or body aches.

## 2021-07-15 NOTE — Progress Notes (Addendum)
PCP - Ihor Dow Cardiologist - [facility] branch EP Physician: Thompson Grayer  PPM/ICD - ICD Device Orders - in epic Rep Notified -   Chest x-ray - n/a EKG - 05/18/21 Stress Test - denies ECHO - 11/21/19 Cardiac Cath - 03/28/14  Sleep Study - IN EPIC CPAP - not currently, had a phillips and it was recalled. Has an appt on 10/27 to be fitted for another mask/machine  Fasting Blood Sugar - 60-145 Checks Blood Sugar 3 times a day  Please follow your surgeon's instructions regarding Aspirin. If you have not received instructions then you need to contact your surgeon's office for instructions.    As of today, STOP taking Aleve, Naproxen, Ibuprofen, Motrin, Advil, Goody's, BC's, all herbal medications, fish oil, and all vitamins.  ERAS Protcol -no   COVID TEST- ambulatory surgery   Anesthesia review: yes, cardiac history and ICD. A1C 7.8  Patient denies shortness of breath, fever, cough and chest pain at PAT appointment   All instructions explained to the patient, with a verbal understanding of the material. Patient agrees to go over the instructions while at home for a better understanding. Patient also instructed to self quarantine after being tested for COVID-19. The opportunity to ask questions was provided.

## 2021-07-16 ENCOUNTER — Encounter (HOSPITAL_COMMUNITY): Payer: Self-pay

## 2021-07-16 ENCOUNTER — Other Ambulatory Visit: Payer: Self-pay | Admitting: *Deleted

## 2021-07-16 ENCOUNTER — Telehealth: Payer: Self-pay | Admitting: Internal Medicine

## 2021-07-16 MED ORDER — LEVOTHYROXINE SODIUM 50 MCG PO TABS: 50.0000 ug | ORAL_TABLET | Freq: Every day | ORAL | 1 refills | Status: DC

## 2021-07-16 NOTE — Telephone Encounter (Signed)
Medication sent to pharmacy  

## 2021-07-16 NOTE — Telephone Encounter (Signed)
Pt called in for LEVOTHYROXINE refills. Pt is out and needs refill sent in . Has a surgery 11/2 and needs to have meds  Wants sent in to [facility]

## 2021-07-16 NOTE — Progress Notes (Addendum)
Anesthesia Chart Review:  Case: [id number] Date/Time: 07/22/21 1052   Procedure: INSERTION OF Acquanetta Belling (Right)   Anesthesia type: General   Pre-op diagnosis: CHF   Location: [facility] OR ROOM 16 / [place] OR   Surgeons: Serafina Mitchell, MD       DISCUSSION: Patient is a 70 year old female scheduled for the above procedure.  History includes former smoker (09/20/78), HTN, DM2, CAD (subtotal occlusion of RCA with left-to-right collaterals, 20% pLAD, 30-40% mD2, 40% OM1, 20-30% OM2, EF 25%, medical therapy 03/2014), cardiomyopathy (LV dysfunction out of proportion to CAD 03/2014), chronic combined systolic and diastolic CHF, ICD (St. Jude/Abbott Quadra Assura MP [phone] CRT-D 05/03/19), dyspnea, OSA (BiPAP device recalled, waiting for replacement), hypothyroidism, thrombotic thrombocytopenic purpura (TTP; 06/2005), erosive esophagitis, splenectomy (07/20/05), short-term memory loss, obesity.   ICD Procedure Rx: Device Type:  Doctor, hospital and Phone #:  St. Jude/Abbott: [phone] Pacemaker Dependent?:  No. Date of Last Device Check:  06/09/21 (remote) 05/13/21 (in-clinic)   Normal Device Function?:  Yes.     Electrophysiologist's Recommendations: Have magnet available. Provide continuous ECG monitoring when magnet is used or reprogramming is to be performed.  Procedure will likely interfere with device function.  Device should be programmed:  Tachy therapies disabled [employer] rep should be present for device management   Her BiPAP is on recall. She has OSA visit with Dr. Halford Chessman on 07/17/21. She can continue ASA per posting. Anesthesia team to evaluate on the day of procedure.    VS: BP 128/69   Pulse 88   Temp 36.8 C (Oral)   Resp 18   Ht _0  (1.651 m)   Wt 104.8 kg   SpO2 98%   BMI 38.44 kg/m    PROVIDERS: Lindell Spar, MD is PCP  - [name], MD is cardiologist - Thompson Grayer, MD is EP cardiologist - Fransico Him, MD is cardiologist for OSA. She has an  appointment as well on 07/15/21 with pulmonologist Chesley Mires, MD for OSA.   LABS: Labs reviewed: Acceptable for surgery. Last A1c 7.8% on 07/09/20. She reported home fasting CBGs ~ 60-145. (all labs ordered are listed, but only abnormal results are displayed)  Labs Reviewed  GLUCOSE, CAPILLARY - Abnormal; Notable for the following components:      Result Value   Glucose-Capillary 122 (*)    All other components within normal limits  CBC - Abnormal; Notable for the following components:   Hemoglobin 11.0 (*)    HCT 34.2 (*)    RDW 17.7 (*)    All other components within normal limits  COMPREHENSIVE METABOLIC PANEL - Abnormal; Notable for the following components:   Glucose, Bld 123 (*)    Creatinine, Ser 1.13 (*)    Albumin 3.4 (*)    GFR, Estimated 52 (*)    All other components within normal limits  URINALYSIS, ROUTINE W REFLEX MICROSCOPIC - Abnormal; Notable for the following components:   APPearance HAZY (*)    All other components within normal limits  SURGICAL PCR SCREEN  PROTIME-INR  APTT  TYPE AND SCREEN    OTHER: BiPAP Titration Study 08/24/19: IMPRESSIONS - An optimal PAP pressure was selected for this patient ( 15 /11cm of water) - Mild Central Sleep Apnea was noted during this titration (CAI = 9.8/h). - Moderete oxygen desaturations were observed during this titration (min O2 = 82.0%). - No snoring was audible during this study. - No cardiac abnormalities were observed during this study. - Clinically significant periodic limb movements were  not noted during this study. Arousals associated with PLMs were rare.  Split night CPAP Sleep Study 07/17/19: IMPRESSIONS - Severe obstructive sleep apnea occurred during the diagnostic portion of the study (AHI = 77.7/hour). An optimal PAP pressure could not be selected for this patient based on the available study data. - Mild central sleep apnea occurred during the diagnostic portion of the study (CAI = 0.5/hour). -  Moderate oxygen desaturation was noted during the diagnostic portion of the study (Min O2 =89.0%). - The patient snored with moderate snoring volume during the diagnostic portion of the study. - EKG findings include PVCs. - Clinically significant periodic limb movements did not occur during sleep.   EKG: 05/18/21: V-paced rhythm   CV: US Carotid 06/01/21: Summary:  Right Carotid: There is no evidence of stenosis in the right ICA.  Left Carotid: There is no evidence of stenosis in the left ICA.  Vertebrals:  Bilateral vertebral arteries demonstrate antegrade flow.  Subclavians: Normal flow hemodynamics were seen in bilateral subclavian               arteries.    Echo (limited) 11/21/19: IMPRESSIONS   1. Grade 1 diastolic dysfunction noted at all A-V delays tested.   2. Limited echo for CRT optimization.   3. Left ventricular ejection fraction, by estimation, is 30 to 35%. The  left ventricle has moderately decreased function. There is mild concentric  left ventricular hypertrophy. Left ventricular diastolic parameters are  consistent with Grade I diastolic  dysfunction (impaired relaxation).   4. The mitral valve is normal in structure and function. No evidence of  mitral valve regurgitation. No evidence of mitral stenosis.   5. The aortic valve is normal in structure and function.    Echo 11/05/19: IMPRESSIONS   1. Left ventricular ejection fraction, by estimation, is 30%. The left  ventricle has moderate to severely decreased function. The left ventricle  demonstrates global hypokinesis with septal-lateral dyssynchrony. There is  mild left ventricular  hypertrophy. Left ventricular diastolic parameters are consistent with  Grade I diastolic dysfunction (impaired relaxation).   2. Right ventricular systolic function is mildly reduced. The right  ventricular size is normal. Tricuspid regurgitation signal is inadequate  for assessing PA pressure.   3. The aortic valve is  tricuspid. Aortic valve regurgitation is not  visualized. No aortic stenosis is present.   4. The mitral valve is normal in structure and function. Trivial mitral  valve regurgitation. No evidence of mitral stenosis.   5. The inferior vena cava is dilated in size with >50% respiratory  variability, suggesting right atrial pressure of 8 mmHg.  - Comparison(s): 12/07/18 EF 30-35%.    RHC/LHC 03/28/14: Coronary dominance: right LM: Normal LAD: Diffuse disease in the proximal vessel up to 20%. 30-40% disease in the mid vessel prior to the second diagonal.  LCx: 40% in OM1. 20-30% in OM2. Otherwise no significant disease. RCA: 99% subtotal occlusion at the crux. TIMI 1 flow. Left to right collaterals to the distal vessel.  Final Conclusions:   1. Single vessel obstructive CAD with subtotal occlusion of the RCA at the crux.  2. Severe LV dysfunction. LVEF is estimated at 25%. There is inferior akinesis and severe global hypokinesis. 3. Normal Right heart pressures. Elevated PCWP. - Recommendations: Medical management. Her degree of LV dysfunction is out of proportion to her CAD. She has no anginal symptoms so PCI of RCA not indicated. The inferior wall appears scarred.   Past Medical History:  Diagnosis Date  Anemia    Arthritis    CAD (coronary artery disease)    a. LHC (03/2014) L[place]: nl, LAD: diff dz proximal 20%, 30-40% dz mid vessel prior 2nd diagonal, LCx: 40% in OM1, 20-30% in OM2, RCA: 99% subtotal occlusion @ crux, TIMI 1 flow, L-R collaterals to distal vessel   Cardiomyopathy ([place]) 03/28/2014   LV dysfunction out of proportion to CAD 03/28/14   CHF (congestive heart failure) ([place])    Phreesia 90/37/9558   Chronic systolic heart failure ([place])    a. ECHO (03/2014): EF 25-30%, akinesis enteroanteroseptal myocardium, grade III DD b. RHC (03/2014) RA 4, RV 42/4, PA 44/14 (26), PCWP 21, PA 62% Fick CO/CI 6.9 / 3.4   Diabetes mellitus    Diabetes mellitus without complication ([place])     Phreesia 10/03/2020   Duodenal ulcer    Age 79   Dyspnea    Erosive esophagitis    Gastritis    Hypertension    Hypothyroidism    Obese    Short-term memory loss    Thyroid disease    TTP (thrombotic thrombocytopenic purpura) ([place]) 06/2005    Past Surgical History:  Procedure Laterality Date   BIV ICD INSERTION CRT-D N/A 05/03/2019   Procedure: BIV ICD INSERTION CRT-D;  Surgeon: Thompson Grayer, MD;  Location: [facility];  Service: Cardiovascular;  Laterality: N/A;   CARDIAC CATHETERIZATION     COLONOSCOPY  08/29/2012   PRA:[id number] diverticulosis   COLONOSCOPY N/A 05/28/2015   Procedure: COLONOSCOPY;  Surgeon: Daneil Dolin, MD;  Location: [facility];  Service: Endoscopy;  Laterality: N/A;  1015   ECTOPIC PREGNANCY SURGERY     ESOPHAGOGASTRODUODENOSCOPY  04/20/2012   GFU:[id number] antral gastritis/Erosive reflux esophagitis   LEFT AND RIGHT HEART CATHETERIZATION WITH CORONARY ANGIOGRAM N/A 03/28/2014   Procedure: LEFT AND RIGHT HEART CATHETERIZATION WITH CORONARY ANGIOGRAM;  Surgeon: Peter M Martinique, MD;  Location: [facility] CATH LAB;  Service: Cardiovascular;  Laterality: N/A;   SPLENECTOMY  07/20/2005    MEDICATIONS:  acetaminophen (TYLENOL) 500 MG tablet   aspirin 81 MG chewable tablet   atorvastatin (LIPITOR) 80 MG tablet   blood glucose meter kit and supplies   carvedilol (COREG) 25 MG tablet   Ciclopirox 0.77 % gel   empagliflozin (JARDIANCE) 10 MG TABS tablet   furosemide (LASIX) 20 MG tablet   insulin aspart (NOVOLOG) 100 UNIT/ML injection   LANTUS 100 UNIT/ML injection   levothyroxine (SYNTHROID) 50 MCG tablet   losartan (COZAAR) 25 MG tablet   Multiple Vitamin (MULTIVITAMIN) tablet   omega-3 fish oil (MAXEPA) 1000 MG CAPS capsule   polyethylene glycol (MIRALAX / GLYCOLAX) packet   spironolactone (ALDACTONE) 25 MG tablet   No current facility-administered medications for this encounter.    Myra Gianotti, PA-C Surgical Short Stay/Anesthesiology [facility]  Phone [phone] [facility] Phone [phone] 07/16/2021 1:53 PM

## 2021-07-16 NOTE — Anesthesia Preprocedure Evaluation (Addendum)
Anesthesia Evaluation  Patient identified by MRN, date of birth, ID band Patient awake    Reviewed: Allergy & Precautions, NPO status , Patient's Chart, lab work & pertinent test results, reviewed documented beta blocker date and time   History of Anesthesia Complications (+) POST - OP SPINAL HEADACHE  Airway Mallampati: II  TM Distance: >3 FB Neck ROM: Full    Dental  (+) Missing, Poor Dentition, Dental Advisory Given,    Pulmonary shortness of breath and with exertion, sleep apnea , former smoker,  Quit smoking 1980   Pulmonary exam normal breath sounds clear to auscultation       Cardiovascular hypertension, Pt. on medications and Pt. on home beta blockers + CAD and +CHF (LVEF 30-35%)  Normal cardiovascular exam Rhythm:Regular Rate:Normal  Echo 11/21/19: 1. Grade 1 diastolic dysfunction noted at all A-V delays tested.  2. Limited echo for CRT optimization.  3. Left ventricular ejection fraction, by estimation, is 30 to 35%. The  left ventricle has moderately decreased function. There is mild concentric  left ventricular hypertrophy. Left ventricular diastolic parameters are  consistent with Grade I diastolic  dysfunction (impaired relaxation).  4. The mitral valve is normal in structure and function. No evidence of  mitral valve regurgitation. No evidence of mitral stenosis.  5. The aortic valve is normal in structure and function.    Neuro/Psych negative neurological ROS  negative psych ROS   GI/Hepatic Neg liver ROS, PUD, GERD  Controlled,  Endo/Other  diabetes, Well Controlled, Type 2, Insulin DependentHypothyroidism Obesity BMI 38 a1c 7.8  Renal/GU negative Renal ROS  negative genitourinary   Musculoskeletal  (+) Arthritis , Osteoarthritis,    Abdominal (+) + obese,   Peds negative pediatric ROS (+)  Hematology negative hematology ROS (+)   Anesthesia Other Findings    Reproductive/Obstetrics negative OB ROS                           Anesthesia Physical Anesthesia Plan  ASA: 4  Anesthesia Plan: General   Post-op Pain Management:    Induction: Intravenous  PONV Risk Score and Plan: 3 and Ondansetron, Dexamethasone, Midazolam and Treatment may vary due to age or medical condition  Airway Management Planned: Oral ETT  Additional Equipment: Arterial line  Intra-op Plan:   Post-operative Plan: Extubation in OR  Informed Consent: I have reviewed the patients History and Physical, chart, labs and discussed the procedure including the risks, benefits and alternatives for the proposed anesthesia with the patient or authorized representative who has indicated his/her understanding and acceptance.     Dental advisory given  Plan Discussed with: CRNA  Anesthesia Plan Comments:        Anesthesia Quick Evaluation

## 2021-07-17 ENCOUNTER — Institutional Professional Consult (permissible substitution): Payer: Medicare HMO | Admitting: Pulmonary Disease

## 2021-07-22 ENCOUNTER — Ambulatory Visit (HOSPITAL_COMMUNITY)
Admission: RE | Admit: 2021-07-22 | Discharge: 2021-07-22 | Disposition: A | Discharge status: Home / Self Care | Payer: Medicare HMO | Attending: Surgery | Admitting: Surgery

## 2021-07-22 ENCOUNTER — Encounter (HOSPITAL_COMMUNITY): Payer: Self-pay | Admitting: Surgery

## 2021-07-22 ENCOUNTER — Other Ambulatory Visit: Payer: Self-pay

## 2021-07-22 ENCOUNTER — Encounter (HOSPITAL_COMMUNITY)
Admission: RE | Disposition: A | Discharge status: Home / Self Care | Payer: Self-pay | Source: Home / Self Care | Attending: Surgery

## 2021-07-22 ENCOUNTER — Ambulatory Visit (HOSPITAL_COMMUNITY): Payer: Medicare HMO | Admitting: Vascular Surgery

## 2021-07-22 ENCOUNTER — Ambulatory Visit (HOSPITAL_COMMUNITY): Payer: Medicare HMO | Admitting: Certified Registered"

## 2021-07-22 DIAGNOSIS — G4733 Obstructive sleep apnea (adult) (pediatric): Secondary | ICD-10-CM | POA: Insufficient documentation

## 2021-07-22 DIAGNOSIS — E7800 Pure hypercholesterolemia, unspecified: Secondary | ICD-10-CM | POA: Insufficient documentation

## 2021-07-22 DIAGNOSIS — Z79899 Other long term (current) drug therapy: Secondary | ICD-10-CM | POA: Insufficient documentation

## 2021-07-22 DIAGNOSIS — I2510 Atherosclerotic heart disease of native coronary artery without angina pectoris: Secondary | ICD-10-CM | POA: Insufficient documentation

## 2021-07-22 DIAGNOSIS — Z6838 Body mass index (BMI) 38.0-38.9, adult: Secondary | ICD-10-CM | POA: Insufficient documentation

## 2021-07-22 DIAGNOSIS — I255 Ischemic cardiomyopathy: Secondary | ICD-10-CM | POA: Diagnosis not present

## 2021-07-22 DIAGNOSIS — Z833 Family history of diabetes mellitus: Secondary | ICD-10-CM | POA: Insufficient documentation

## 2021-07-22 DIAGNOSIS — Z87891 Personal history of nicotine dependence: Secondary | ICD-10-CM | POA: Diagnosis not present

## 2021-07-22 DIAGNOSIS — Z7982 Long term (current) use of aspirin: Secondary | ICD-10-CM | POA: Insufficient documentation

## 2021-07-22 DIAGNOSIS — E119 Type 2 diabetes mellitus without complications: Secondary | ICD-10-CM | POA: Diagnosis not present

## 2021-07-22 DIAGNOSIS — I5042 Chronic combined systolic (congestive) and diastolic (congestive) heart failure: Secondary | ICD-10-CM | POA: Diagnosis not present

## 2021-07-22 DIAGNOSIS — K219 Gastro-esophageal reflux disease without esophagitis: Secondary | ICD-10-CM | POA: Insufficient documentation

## 2021-07-22 DIAGNOSIS — Z8379 Family history of other diseases of the digestive system: Secondary | ICD-10-CM | POA: Diagnosis not present

## 2021-07-22 DIAGNOSIS — Z95810 Presence of automatic (implantable) cardiac defibrillator: Secondary | ICD-10-CM | POA: Insufficient documentation

## 2021-07-22 DIAGNOSIS — Z794 Long term (current) use of insulin: Secondary | ICD-10-CM | POA: Insufficient documentation

## 2021-07-22 DIAGNOSIS — E669 Obesity, unspecified: Secondary | ICD-10-CM | POA: Diagnosis not present

## 2021-07-22 DIAGNOSIS — Z803 Family history of malignant neoplasm of breast: Secondary | ICD-10-CM | POA: Insufficient documentation

## 2021-07-22 DIAGNOSIS — I110 Hypertensive heart disease with heart failure: Secondary | ICD-10-CM | POA: Insufficient documentation

## 2021-07-22 DIAGNOSIS — I5022 Chronic systolic (congestive) heart failure: Secondary | ICD-10-CM | POA: Insufficient documentation

## 2021-07-22 DIAGNOSIS — Z79890 Hormone replacement therapy (postmenopausal): Secondary | ICD-10-CM | POA: Insufficient documentation

## 2021-07-22 DIAGNOSIS — Z8249 Family history of ischemic heart disease and other diseases of the circulatory system: Secondary | ICD-10-CM | POA: Insufficient documentation

## 2021-07-22 DIAGNOSIS — Z006 Encounter for examination for normal comparison and control in clinical research program: Secondary | ICD-10-CM | POA: Diagnosis not present

## 2021-07-22 DIAGNOSIS — Z888 Allergy status to other drugs, medicaments and biological substances status: Secondary | ICD-10-CM | POA: Insufficient documentation

## 2021-07-22 DIAGNOSIS — E1140 Type 2 diabetes mellitus with diabetic neuropathy, unspecified: Secondary | ICD-10-CM | POA: Diagnosis not present

## 2021-07-22 LAB — GLUCOSE, CAPILLARY
Glucose-Capillary: 116 mg/dL — ABNORMAL HIGH (ref 70–99)
Glucose-Capillary: 100 mg/dL — ABNORMAL HIGH (ref 70–99)

## 2021-07-22 LAB — ABO/RH: ABO/RH(D): A POS

## 2021-07-22 SURGERY — INSERTION, CAROTID SINUS BAROREFLEX ACTIVATION DEVICE
Anesthesia: General | Site: Chest | Laterality: Right

## 2021-07-22 MED ORDER — OXYCODONE HCL 5 MG/5ML PO SOLN: 5.0000 mg | Freq: Once | ORAL | Status: AC | PRN

## 2021-07-22 MED ORDER — CHLORHEXIDINE GLUCONATE CLOTH 2 % EX PADS: 6.0000 | MEDICATED_PAD | Freq: Once | CUTANEOUS | Status: DC

## 2021-07-22 MED ORDER — ETOMIDATE 2 MG/ML IV SOLN
INTRAVENOUS | Status: DC | PRN
  Administered 2021-07-22: 54 mg via INTRAVENOUS

## 2021-07-22 MED ORDER — OXYCODONE HCL 5 MG PO TABS: 5.0000 mg | ORAL_TABLET | Freq: Once | ORAL | Status: AC | PRN

## 2021-07-22 MED ORDER — SODIUM CHLORIDE 0.9 % IV SOLN
0.0125 ug/kg/min | INTRAVENOUS | Status: AC
  Administered 2021-07-22: .1 ug/kg/min via INTRAVENOUS
  Filled 2021-07-22: qty 2000

## 2021-07-22 MED ORDER — ONDANSETRON HCL 4 MG/2ML IJ SOLN: 4.0000 mg | Freq: Once | INTRAMUSCULAR | Status: AC | PRN

## 2021-07-22 MED ORDER — METOCLOPRAMIDE HCL 5 MG/ML IJ SOLN: 10.0000 mg | Freq: Once | INTRAMUSCULAR | Status: AC | PRN

## 2021-07-22 MED ORDER — ONDANSETRON HCL 4 MG/2ML IJ SOLN
INTRAMUSCULAR | Status: AC
  Filled 2021-07-22: qty 2

## 2021-07-22 MED ORDER — FENTANYL CITRATE (PF) 100 MCG/2ML IJ SOLN
INTRAMUSCULAR | Status: AC
  Administered 2021-07-22: 50 ug via INTRAVENOUS
  Filled 2021-07-22: qty 2

## 2021-07-22 MED ORDER — 0.9 % SODIUM CHLORIDE (POUR BTL) OPTIME
TOPICAL | Status: DC | PRN
  Administered 2021-07-22: 1000 mL

## 2021-07-22 MED ORDER — CHLORHEXIDINE GLUCONATE 0.12 % MT SOLN
OROMUCOSAL | Status: AC
  Administered 2021-07-22: 15 mL via OROMUCOSAL
  Filled 2021-07-22: qty 15

## 2021-07-22 MED ORDER — ONDANSETRON HCL 4 MG/2ML IJ SOLN
INTRAMUSCULAR | Status: DC | PRN
  Administered 2021-07-22: 4 mg via INTRAVENOUS

## 2021-07-22 MED ORDER — CHLORHEXIDINE GLUCONATE 0.12 % MT SOLN: 15.0000 mL | Freq: Once | OROMUCOSAL | Status: AC

## 2021-07-22 MED ORDER — FENTANYL CITRATE (PF) 250 MCG/5ML IJ SOLN
INTRAMUSCULAR | Status: AC
  Filled 2021-07-22: qty 5

## 2021-07-22 MED ORDER — TRAMADOL HCL 50 MG PO TABS: 50.0000 mg | ORAL_TABLET | Freq: Four times a day (QID) | ORAL | 0 refills | Status: DC | PRN

## 2021-07-22 MED ORDER — OXYCODONE HCL 5 MG PO TABS
ORAL_TABLET | ORAL | Status: AC
  Administered 2021-07-22: 5 mg via ORAL
  Filled 2021-07-22: qty 1

## 2021-07-22 MED ORDER — FENTANYL CITRATE (PF) 100 MCG/2ML IJ SOLN: 25.0000 ug | INTRAMUSCULAR | Status: DC | PRN

## 2021-07-22 MED ORDER — AMISULPRIDE (ANTIEMETIC) 5 MG/2ML IV SOLN
INTRAVENOUS | Status: AC
  Administered 2021-07-22: 10 mg via INTRAVENOUS
  Filled 2021-07-22: qty 4

## 2021-07-22 MED ORDER — ROCURONIUM BROMIDE 10 MG/ML (PF) SYRINGE
PREFILLED_SYRINGE | INTRAVENOUS | Status: DC | PRN
  Administered 2021-07-22: 80 mg via INTRAVENOUS

## 2021-07-22 MED ORDER — SODIUM CHLORIDE 0.9 % IV SOLN
INTRAVENOUS | Status: DC | PRN
  Administered 2021-07-22: 500 mL

## 2021-07-22 MED ORDER — AMISULPRIDE (ANTIEMETIC) 5 MG/2ML IV SOLN: 10.0000 mg | Freq: Once | INTRAVENOUS | Status: AC | PRN

## 2021-07-22 MED ORDER — CEFAZOLIN SODIUM-DEXTROSE 2-4 GM/100ML-% IV SOLN
2.0000 g | INTRAVENOUS | Status: AC
  Administered 2021-07-22: 2 g via INTRAVENOUS

## 2021-07-22 MED ORDER — FENTANYL CITRATE (PF) 250 MCG/5ML IJ SOLN
INTRAMUSCULAR | Status: DC | PRN
  Administered 2021-07-22: 100 ug via INTRAVENOUS

## 2021-07-22 MED ORDER — SUGAMMADEX SODIUM 200 MG/2ML IV SOLN
INTRAVENOUS | Status: DC | PRN
  Administered 2021-07-22: 209.6 mg via INTRAVENOUS

## 2021-07-22 MED ORDER — MIDAZOLAM HCL 2 MG/2ML IJ SOLN
INTRAMUSCULAR | Status: DC | PRN
  Administered 2021-07-22: 2 mg via INTRAVENOUS

## 2021-07-22 MED ORDER — MIDAZOLAM HCL 2 MG/2ML IJ SOLN
INTRAMUSCULAR | Status: AC
  Filled 2021-07-22: qty 2

## 2021-07-22 MED ORDER — LACTATED RINGERS IV SOLN: INTRAVENOUS | Status: DC

## 2021-07-22 MED ORDER — LACTATED RINGERS IV SOLN: INTRAVENOUS | Status: DC | PRN

## 2021-07-22 MED ORDER — METOCLOPRAMIDE HCL 5 MG/ML IJ SOLN
INTRAMUSCULAR | Status: AC
  Administered 2021-07-22: 10 mg via INTRAVENOUS
  Filled 2021-07-22: qty 2

## 2021-07-22 MED ORDER — SCOPOLAMINE 1 MG/3DAYS TD PT72
MEDICATED_PATCH | TRANSDERMAL | Status: AC
  Administered 2021-07-22: 1.5 mg via TRANSDERMAL
  Filled 2021-07-22: qty 1

## 2021-07-22 MED ORDER — SCOPOLAMINE 1 MG/3DAYS TD PT72: 1.0000 | MEDICATED_PATCH | TRANSDERMAL | Status: DC

## 2021-07-22 MED ORDER — CEFAZOLIN SODIUM-DEXTROSE 2-4 GM/100ML-% IV SOLN
INTRAVENOUS | Status: AC
  Filled 2021-07-22: qty 100

## 2021-07-22 MED ORDER — ORAL CARE MOUTH RINSE: 15.0000 mL | Freq: Once | OROMUCOSAL | Status: AC

## 2021-07-22 MED ORDER — ONDANSETRON HCL 4 MG/2ML IJ SOLN
INTRAMUSCULAR | Status: AC
  Administered 2021-07-22: 4 mg via INTRAVENOUS
  Filled 2021-07-22: qty 2

## 2021-07-22 MED ORDER — ETOMIDATE 2 MG/ML IV SOLN
INTRAVENOUS | Status: DC | PRN
  Administered 2021-07-22: 4 mg via INTRAVENOUS
  Administered 2021-07-22 (×2): 2 mg via INTRAVENOUS

## 2021-07-22 MED ORDER — SODIUM CHLORIDE 0.9 % IV SOLN: INTRAVENOUS | Status: DC

## 2021-07-22 SURGICAL SUPPLY — 42 items
ADH SKN CLS APL DERMABOND .7 (GAUZE/BANDAGES/DRESSINGS) ×2
BAG COUNTER SPONGE SURGICOUNT (BAG) ×2 IMPLANT
BAG SPNG CNTER NS LX DISP (BAG) ×1
CANISTER SUCT 3000ML PPV (MISCELLANEOUS) ×2 IMPLANT
CLIP VESOCCLUDE MED 6/CT (CLIP) IMPLANT
CLIP VESOCCLUDE SM WIDE 6/CT (CLIP) IMPLANT
DERMABOND ADVANCED (GAUZE/BANDAGES/DRESSINGS) ×2
DERMABOND ADVANCED .7 DNX12 (GAUZE/BANDAGES/DRESSINGS) ×1 IMPLANT
ELECT REM PT RETURN 9FT ADLT (ELECTROSURGICAL) ×2
ELECTRODE REM PT RTRN 9FT ADLT (ELECTROSURGICAL) ×1 IMPLANT
GAUZE 4X4 16PLY ~~LOC~~+RFID DBL (SPONGE) ×1 IMPLANT
GLOVE SRG 8 PF TXTR STRL LF DI (GLOVE) ×1 IMPLANT
GLOVE SURG POLYISO LF SZ7.5 (GLOVE) ×2 IMPLANT
GLOVE SURG UNDER POLY LF SZ8 (GLOVE) ×2
GOWN STRL REUS W/ TWL LRG LVL3 (GOWN DISPOSABLE) ×3 IMPLANT
GOWN STRL REUS W/ TWL XL LVL3 (GOWN DISPOSABLE) ×1 IMPLANT
GOWN STRL REUS W/TWL LRG LVL3 (GOWN DISPOSABLE) ×8
GOWN STRL REUS W/TWL XL LVL3 (GOWN DISPOSABLE) ×2
HEMOSTAT SNOW SURGICEL 2X4 (HEMOSTASIS) IMPLANT
KIT BASIN OR (CUSTOM PROCEDURE TRAY) ×2 IMPLANT
KIT TURNOVER KIT B (KITS) ×2 IMPLANT
LEAD CAROTID BAROSTIM 1036 (Lead) ×1 IMPLANT
MARKER SKIN DUAL TIP RULER LAB (MISCELLANEOUS) ×2 IMPLANT
NDL 18GX1X1/2 (RX/OR ONLY) (NEEDLE) ×1 IMPLANT
NEEDLE 18GX1X1/2 (RX/OR ONLY) (NEEDLE) ×2 IMPLANT
NS IRRIG 1000ML POUR BTL (IV SOLUTION) ×3 IMPLANT
PACK CAROTID (CUSTOM PROCEDURE TRAY) ×2 IMPLANT
PAD ARMBOARD 7.5X6 YLW CONV (MISCELLANEOUS) ×4 IMPLANT
POSITIONER HEAD DONUT 9IN (MISCELLANEOUS) ×2 IMPLANT
SPONGE T-LAP 18X18 ~~LOC~~+RFID (SPONGE) ×1 IMPLANT
SUT ETHIBOND CT1 BRD #0 30IN (SUTURE) ×4 IMPLANT
SUT ETHILON 3 0 PS 1 (SUTURE) IMPLANT
SUT PROLENE 6 0 BV (SUTURE) ×18 IMPLANT
SUT SILK 0 FSL (SUTURE) IMPLANT
SUT VIC AB 3-0 SH 27 (SUTURE) ×4
SUT VIC AB 3-0 SH 27X BRD (SUTURE) ×2 IMPLANT
SUT VICRYL 4-0 PS2 18IN ABS (SUTURE) ×4 IMPLANT
SYR 5ML LL (SYRINGE) ×2 IMPLANT
SYR BULB IRRIG 60ML STRL (SYRINGE) ×2 IMPLANT
SYSTEM IPG BAROSTIM 2104 (Generator) ×1 IMPLANT
TOWEL GREEN STERILE (TOWEL DISPOSABLE) ×2 IMPLANT
WATER STERILE IRR 1000ML POUR (IV SOLUTION) ×2 IMPLANT

## 2021-07-22 NOTE — Op Note (Signed)
    Patient name: [name] MRN: [id number] DOB: 09/25/1950 Sex: female  07/22/2021 Pre-operative Diagnosis: NYHA class III heart failure Post-operative diagnosis:  Same Surgeon:  Annamarie Major Assistants:  Ivin Booty, PA Procedure:   Barostim Neo device implant Anesthesia:  General Blood Loss:  minimal Specimens:  none  Findings: Device placed at position "E" Device: #[id number], model 10/21/2002 Lead: #[id number], model 1036  Indications: This is a 70 year old female with NYHA class III heart failure symptoms who comes in today for Barostim Neo device implant.  Details of the procedure as well as the risks and benefits were discussed with the patient and she wished to proceed.  Procedure:  he patient was identified in the holding area and taken to [place] [facility]  The patient was then placed supine on the table. general anesthesia was administered.  The patient was prepped and draped in the usual sterile fashion.  A time out was called and antibiotics were administered.  A PA was necessary to expedite procedure and assist with technical details.  Using ultrasound, identified the location of the carotid bifurcation on the right.  A 3 cm transverse incision was made at the level of the bifurcation in the right neck.  Cautery was used divide the subcutaneous tissue and platysma muscle.  I encountered several veins branching off the facial vein which were divided between silk ties.  Ultimately I identified the carotid artery.  This was somewhat externally rotated.  After further mobilization I was able to expose the carotid bifurcation.  Next I created the pocket for the generator.  A transverse incision was made 1 fingerbreadth below the clavicle.  Cautery was used divide subcutaneous tissue down to the pectoral fascia.  I then created the pocket using blunt and cautery dissection.  Next, a tunnel was made between the 2 incisions.  The lead wire was then brought through the tunnel and connected  to the generator which was placed in the pocket.  I then began with device testing.  Initial testing was performed at position "A".  I did not get a good hemodynamic response and therefore removed the device to position  "E".  There was an excellent hemodynamic response.  I then placed two 6-0 Prolene sutures in the carotid adventitia and the electrode.  Device testing was repeated and we had a similar excellent hemodynamic response.  I then proceeded to secure the electrode with 4 additional 6-0 Prolene sutures.  I then irrigated the carotid incision and the generator incision with antibiotic impregnated solution.  I then placed the generator back into the pocket and secured it to the pectoral fascia with 2-0 Ethibond sutures.  I confirmed that there was good hemostasis.  The wiring was placed on the medial side of the generator and the infraclavicular pocket.  The carotid incision was closed by reapproximating the platysma with 3-0 Vicryl and the skin with 4-0 Vicryl.  The generator incision was closed by reapproximating the subcutaneous tissue with 3-0 Vicryl and the skin with 3-0 Vicryl.  Dermabond was placed on the incisions.  The patient was successfully extubated, found to be neurologically intact and taken to the recovery room in stable condition.  There were no immediate complications.     Disposition: To PACU stable   V. Annamarie Major, M.D., [facility] [place] Office: [phone] Pager:  [phone]

## 2021-07-22 NOTE — Discharge Instructions (Signed)
Keep incisions dry for 48 hours then you may wash them with mild soap and water, pat dry

## 2021-07-22 NOTE — Anesthesia Procedure Notes (Addendum)
Procedure Name: Intubation Date/Time: 07/22/2021 11:35 AM Performed by: Annamary Carolin, CRNA Pre-anesthesia Checklist: Patient identified, Emergency Drugs available, Suction available and Patient being monitored Patient Re-evaluated:Patient Re-evaluated prior to induction Oxygen Delivery Method: Circle System Utilized Preoxygenation: Pre-oxygenation with 100% oxygen Induction Type: IV induction Ventilation: Mask ventilation without difficulty Laryngoscope Size: Mac and 3 Grade View: Grade I Tube type: Oral Tube size: 7.0 mm Number of attempts: 1 Airway Equipment and Method: Stylet Placement Confirmation: ETT inserted through vocal cords under direct vision, positive ETCO2 and breath sounds checked- equal and bilateral Secured at: 22 cm Tube secured with: Tape Dental Injury: Teeth and Oropharynx as per pre-operative assessment

## 2021-07-22 NOTE — Transfer of Care (Signed)
Immediate Anesthesia Transfer of Care Note  Patient: [name]  Procedure(s) Performed: INSERTION OF Acquanetta Belling (Right: Chest)  Patient Location: PACU  Anesthesia Type:General  Level of Consciousness: awake, alert , oriented and patient cooperative  Airway & Oxygen Therapy: Patient Spontanous Breathing and Patient connected to face mask oxygen  Post-op Assessment: Report given to RN, Post -op Vital signs reviewed and stable and Patient moving all extremities X 4  Post vital signs: Reviewed and stable  Last Vitals:  Vitals Value Taken Time  BP 131/78 07/22/21 1319  Temp    Pulse 73 07/22/21 1321  Resp 12 07/22/21 1321  SpO2 93 % 07/22/21 1321  Vitals shown include unvalidated device data.  Last Pain:  Vitals:   07/22/21 0916  TempSrc:   PainSc: 0-No pain         Complications: No notable events documented.

## 2021-07-22 NOTE — Anesthesia Postprocedure Evaluation (Signed)
Anesthesia Post Note  Patient: [name]  Procedure(s) Performed: INSERTION OF BAROSTIM (Right: Chest)     Patient location during evaluation: PACU Anesthesia Type: General Level of consciousness: awake and alert, oriented and patient cooperative Pain management: pain level controlled Vital Signs Assessment: post-procedure vital signs reviewed and stable Respiratory status: spontaneous breathing, nonlabored ventilation and respiratory function stable Cardiovascular status: blood pressure returned to baseline and stable Postop Assessment: no apparent nausea or vomiting Anesthetic complications: no   No notable events documented.  Last Vitals:  Vitals:   07/22/21 1320 07/22/21 1335  BP: 131/78 126/77  Pulse: 74 74  Resp: 14 (!) 9  Temp: 36.6 C   SpO2: 96% 95%    Last Pain:  Vitals:   07/22/21 1340  TempSrc:   PainSc: [place]

## 2021-07-22 NOTE — Anesthesia Procedure Notes (Signed)
Arterial Line Insertion Start/End11/10/2020 10:10 AM Performed by: Carolan Clines, CRNA, CRNA  Patient location: Pre-op. Preanesthetic checklist: patient identified, IV checked, site marked, risks and benefits discussed, surgical consent, monitors and equipment checked, pre-op evaluation, timeout performed and anesthesia consent Lidocaine 1% used for infiltration Right, radial was placed Catheter size: 20 G Hand hygiene performed  and maximum sterile barriers used   Attempts: 1 Procedure performed without using ultrasound guided technique. Following insertion, dressing applied and Biopatch. Post procedure assessment: normal and unchanged  Patient tolerated the procedure well with no immediate complications.

## 2021-07-22 NOTE — H&P (Signed)
Vascular and Vein Specialist of [facility]   Patient name: [name]           MRN: [id number]        DOB: [birth year]            Sex: female     REQUESTING PROVIDER:    Oda Kilts     REASON FOR CONSULT:    Barostim referral   HISTORY OF PRESENT ILLNESS:    [name] is a 70 y.o. female, who is being referred for evaluation of a Barostim device implant.  The patient suffers from NYHA class III heart failure.  She has a ICD in place.  Her most recent ejection fraction was 30-35%.  She continues to suffer from dyspnea with exertion edema and fatigue.  Patient suffers from coronary artery disease.  Catheterization from 2015 showed a 9 9% RCA stenosis.  She is being medically managed without chest pain.  She is on BiPAP at night for obstructive sleep apnea.  She takes a statin for hypercholesterolemia.  She is a diabetic.   PAST MEDICAL HISTORY          Past Medical History:  Diagnosis Date   Anemia     Arthritis     CAD (coronary artery disease)      a. LHC (03/2014) [place]: nl, LAD: diff dz proximal 20%, 30-40% dz mid vessel prior 2nd diagonal, LCx: 40% in OM1, 20-30% in OM2, RCA: 99% subtotal occlusion @ crux, TIMI 1 flow, L-R collaterals to distal vessel   CHF (congestive heart failure) ([place])      Phreesia 02/72/5366   Chronic systolic heart failure ([place])      a. ECHO (03/2014): EF 25-30%, akinesis enteroanteroseptal myocardium, grade III DD b. RHC (03/2014) RA 4, RV 42/4, PA 44/14 (26), PCWP 21, PA 62% Fick CO/CI 6.9 / 3.4   Diabetes mellitus     Diabetes mellitus without complication ([place])      Phreesia 10/03/2020   Duodenal ulcer      Age 76   Erosive esophagitis     Gastritis     Hypertension     Ischemic cardiomyopathy     Obese     Short-term memory loss     Thyroid disease     TTP (thrombotic thrombocytopenic purpura)          FAMILY HISTORY         Family History  Problem Relation Age of Onset   Heart failure Mother     Cirrhosis Father           ETOH   Diabetes Daughter     Breast cancer Maternal Aunt     Colon cancer Neg Hx        SOCIAL HISTORY:    Social History         Socioeconomic History   Marital status: Divorced      Spouse name: Not on file   Number of children: 3   Years of education: Not on file   Highest education level: Not on file  Occupational History   Occupation: disabled      Employer: NOT EMPLOYED  Tobacco Use   Smoking status: Former      Packs/day: 0.25      Years: 10.00      Pack years: 2.50      Types: Cigarettes      Start date: 09/20/1966      Quit date: 09/20/1978      Years  since quitting: 42.7   Smokeless tobacco: Never  Vaping Use   Vaping Use: Never used  Substance and Sexual Activity   Alcohol use: No      Alcohol/week: 0.0 standard drinks   Drug use: No   Sexual activity: Not on file  Other Topics Concern   Not on file  Social History Narrative    Lives Alone          Social Determinants of Health       Financial Resource Strain: Low Risk    Difficulty of Paying Living Expenses: Not hard at all  Food Insecurity: No Food Insecurity   Worried About Charity fundraiser in the Last Year: Never true   Ran Out of Food in the Last Year: Never true  Transportation Needs: No Transportation Needs   Lack of Transportation (Medical): No   Lack of Transportation (Non-Medical): No  Physical Activity: Insufficiently Active   Days of Exercise per Week: 2 days   Minutes of Exercise per Session: 20 min  Stress: No Stress Concern Present   Feeling of Stress : Not at all  Social Connections: Socially Isolated   Frequency of Communication with Friends and Family: Never   Frequency of Social Gatherings with Friends and Family: Never   Attends Religious Services: More than 4 times per year   Active Member of [employer] or Organizations: No   Attends Music therapist: Never   Marital Status: Divorced  Human resources officer Violence: Not At Risk   Fear of Current or Ex-Partner: No    Emotionally Abused: No   Physically Abused: No   Sexually Abused: No      ALLERGIES:           Allergies  Allergen Reactions   Other Other (See Comments)      FLAGYL, CIPRO, PROTONIX taken at the same time caused patient to lose her breath and have trouble breathing, requiring hospital stay at [facility], 03/23/14-per patient.    Lisinopril Cough      CURRENT MEDICATIONS:            Current Outpatient Medications  Medication Sig Dispense Refill   acetaminophen (TYLENOL) 500 MG tablet Take 1,000 mg by mouth every 6 (six) hours as needed for moderate pain.       aspirin 81 MG chewable tablet Chew 1 tablet (81 mg total) by mouth daily.       atorvastatin (LIPITOR) 80 MG tablet TAKE 1 TABLET EVERY DAY 90 tablet 1   blood glucose meter kit and supplies Dispense based on patient and insurance preference. Use up to four times daily as directed. (FOR ICD-10 E10.9, E11.9). 1 each 0   carvedilol (COREG) 25 MG tablet TAKE 1 TABLET TWICE DAILY 180 tablet 3   Ciclopirox 0.77 % gel APPLY ONE TIME DAILY ON THE AFFECTED TOE NAIL (Patient taking differently: as needed. APPLY ONE TIME DAILY ON THE AFFECTED TOE NAIL.) 30 g 0   empagliflozin (JARDIANCE) 10 MG TABS tablet Take 1 tablet (10 mg total) by mouth daily before breakfast. 14 tablet 0   furosemide (LASIX) 20 MG tablet TAKE 1 TABLET EVERY DAY 90 tablet 3   furosemide (LASIX) 40 MG tablet Take 1 tablet (40 mg total) by mouth daily. 30 tablet 6   insulin aspart (NOVOLOG) 100 UNIT/ML injection Inject 6 Units into the skin 3 (three) times daily before meals.       insulin glargine (LANTUS) 100 UNIT/ML injection Inject 0.3 mLs (30 Units total)  into the skin 2 (two) times daily.       levothyroxine (SYNTHROID) 50 MCG tablet Take 50 mcg by mouth daily before breakfast.       losartan (COZAAR) 25 MG tablet TAKE 1 TABLET EVERY DAY 90 tablet 2   Multiple Vitamin (MULTIVITAMIN) tablet Take 1 tablet by mouth daily.       omega-3 fish oil (MAXEPA) 1000 MG CAPS  capsule Take 1 capsule by mouth daily.       polyethylene glycol (MIRALAX / GLYCOLAX) packet Take 17 g by mouth daily as needed for mild constipation.        spironolactone (ALDACTONE) 25 MG tablet TAKE 1 TABLET EVERY DAY 90 tablet 1    No current facility-administered medications for this visit.      REVIEW OF SYSTEMS:    [X]  denotes positive finding, [ ]  denotes negative finding Cardiac   Comments:  Chest pain or chest pressure:      Shortness of breath upon exertion: x    Short of breath when lying flat:      Irregular heart rhythm:             Vascular      Pain in calf, thigh, or hip brought on by ambulation:      Pain in feet at night that wakes you up from your sleep:       Blood clot in your veins:      Leg swelling:              Pulmonary      Oxygen at home:      Productive cough:       Wheezing:              Neurologic      Sudden weakness in arms or legs:       Sudden numbness in arms or legs:       Sudden onset of difficulty speaking or slurred speech:      Temporary loss of vision in one eye:       Problems with dizziness:              Gastrointestinal      Blood in stool:         Vomited blood:              Genitourinary      Burning when urinating:       Blood in urine:             Psychiatric      Major depression:              Hematologic      Bleeding problems:      Problems with blood clotting too easily:             Skin      Rashes or ulcers:             Constitutional      Fever or chills:        PHYSICAL EXAM:       Vitals:    06/01/21 1441  BP: 134/80  Pulse: 74  Resp: 20  Temp: 97.8 F (36.6 C)  SpO2: 94%  Weight: 233 lb (105.7 kg)  Height: 5' 5"  (1.651 m)      GENERAL: The patient is a well-nourished female, in no acute distress. The vital signs are documented above. CARDIAC: There is a regular rate and rhythm.  VASCULAR:  SonoSite was used to evaluate the carotid bifurcation which was in the mid neck PULMONARY:  Nonlabored respirations ABDOMEN: Soft and non-tender with normal pitched bowel sounds.  MUSCULOSKELETAL: There are no major deformities or cyanosis. NEUROLOGIC: No focal weakness or paresthesias are detected. SKIN: There are no ulcers or rashes noted. PSYCHIATRIC: The patient has a normal affect.   STUDIES:  I have reviewed his carotid Doppler studies with the following results:   Right Carotid: There is no evidence of stenosis in the right ICA.   Left Carotid: There is no evidence of stenosis in the left ICA.   Vertebrals:  Bilateral vertebral arteries demonstrate antegrade flow.  Subclavians: Normal flow hemodynamics were seen in bilateral subclavian               arteries.    ASSESSMENT and PLAN    NYHA class III heart failure: The patient is an excellent candidate for aBarostim device implant.  I discussed the details of the procedure as well as the risks and benefits.  All her questions were answered.  A pending insurance approval, this will be scheduled in the near future.     Leia Alf, MD, FACS [facility] of [facility] [phone] Pager [phone]   No changes since I last saw her PE: CV:RRR PULM:CTA Neiro: intact  Again discussed procedure and answered her questions  Annamarie Major

## 2021-07-22 NOTE — Progress Notes (Signed)
[employer] rep called to evaluate status of ICD. Request to perform bedside transmission. Transmission performed. [employer] rep, Aaron Edelman ) called back to confirm that ICD is on and fax to be sent.

## 2021-07-27 ENCOUNTER — Ambulatory Visit (INDEPENDENT_AMBULATORY_CARE_PROVIDER_SITE_OTHER): Payer: Medicare HMO

## 2021-07-27 DIAGNOSIS — Z95810 Presence of automatic (implantable) cardiac defibrillator: Secondary | ICD-10-CM | POA: Diagnosis not present

## 2021-07-27 DIAGNOSIS — I5022 Chronic systolic (congestive) heart failure: Secondary | ICD-10-CM

## 2021-07-27 NOTE — Progress Notes (Signed)
EPIC Encounter for ICM Monitoring  Patient Name: [name] is a 70 y.o. female Date: 07/27/2021 Primary Care Physican: Lindell Spar, MD Primary Cardiologist: Branch Electrophysiologist: Allred Bi-V Pacing:  99%        07/21/2021 Weight: 231 lbs (unable to weigh since surgery d/t dizziness.                                                             Spoke with patient and heart failure questions reviewed.  Pt reporting swelling around both incisions and upper chest are and 11/2 Barostim procedure.  No swelling in feet or legs.  She is feeling dizzy and needs assistance getting up.  She has only felt like eating soups (high in salt) and lot of fluids since having procedure.  May be getting >64 oz fluid daily in addition to eating soups that are typically high in salt since procedure.    Corvue Thoracic impedance suggesting possible fluid accumulation starting 11/3 which correlates with hospitalization for Barostim surgery.  Prescribed:  Furosemide 20 mg take 1 tablet by mouth daily.  Furosemide on hold for procedure starting Tuesday 11/1 and restarted 11/4.   Spironolactone 25 mg take 1 tablet daily.   Labs: 07/15/2021 Creatinine 1.13, BUN 20, Potassium 4.0, Sodium 138, GFR 52 05/13/2021 Creatinine 1.09, BUN 19, Potassium 4.5, Sodium 138, GFR 54, NT-Pro BNP 476 03/14/2020 Creatinine 1.32, BUN 33, Potassium 5.5, Sodium 135, GFR 40-47 A complete set of results can be found in Results Review.   Recommendations:  Advised to limit fluid intake to 64 oz daily and avoid foods high in salt such as canned soups.  Sent to Dr Harl Bowie for review and recommendations if needed.    Follow-up plan: ICM clinic phone appointment on 08/04/2021 to recheck fluid levels.    91 day device clinic remote transmission 08/10/2021.        EP/Cardiology Office Visits: 09/17/2021 with Dr. Harl Bowie.  08/11/2021 with Oda Kilts, PA for Barostim follow up post implant.   Copy of ICM check sent to Dr. Rayann Heman.      3 month ICM trend: 07/27/2021.    1 Year ICM trend:       [name], RN 07/27/2021 10:27 AM

## 2021-07-28 NOTE — Progress Notes (Signed)
Spoke with patient and advised of Dr Nelly Laurence recommendation not to make any changes at this time.  Explained Dr Harl Bowie thinks since she has restarted the diuretic the fluid levels should normalize.  Advised to call back if she develops any symptoms.

## 2021-07-28 NOTE — Progress Notes (Signed)
Received: Today Branch, Alphonse Guild, MD  Christina Hodges Panda, RN If off lasix for a few days for her procedure now that she is back on I would think will get back to normal levels. Would not make any changes    Zandra Abts MD

## 2021-08-03 ENCOUNTER — Other Ambulatory Visit: Payer: Self-pay

## 2021-08-03 ENCOUNTER — Encounter: Payer: Self-pay | Admitting: Surgery

## 2021-08-03 ENCOUNTER — Ambulatory Visit (INDEPENDENT_AMBULATORY_CARE_PROVIDER_SITE_OTHER): Payer: Medicare HMO | Admitting: Surgery

## 2021-08-03 ENCOUNTER — Encounter: Payer: Medicare HMO | Admitting: Surgery

## 2021-08-03 VITALS — BP 95/62 | HR 87 | Temp 97.9°F | Resp 20 | Ht 65.0 in | Wt 231.0 lb

## 2021-08-03 DIAGNOSIS — I5020 Unspecified systolic (congestive) heart failure: Secondary | ICD-10-CM

## 2021-08-03 NOTE — Progress Notes (Signed)
Patient name: [name] MRN: [id number] DOB: 1951-08-01 Sex: female  REASON FOR VISIT:     Post op  HISTORY OF PRESENT ILLNESS:   [name] is a 70 y.o. female who underwent insertion of a right sided Barostim on 07/22/2021.  She is back today for follow-up.  She states that she is a little more tired than usual but otherwise has no complaints  CURRENT MEDICATIONS:    Current Outpatient Medications  Medication Sig Dispense Refill   acetaminophen (TYLENOL) 500 MG tablet Take 1,000 mg by mouth every 6 (six) hours as needed for moderate pain.     aspirin 81 MG chewable tablet Chew 1 tablet (81 mg total) by mouth daily.     atorvastatin (LIPITOR) 80 MG tablet TAKE 1 TABLET EVERY DAY (Patient taking differently: Take 80 mg by mouth daily.) 90 tablet 1   blood glucose meter kit and supplies Dispense based on patient and insurance preference. Use up to four times daily as directed. (FOR ICD-10 E10.9, E11.9). 1 each 0   carvedilol (COREG) 25 MG tablet TAKE 1 TABLET TWICE DAILY (Patient taking differently: Take 25 mg by mouth 2 (two) times daily with a meal.) 180 tablet 3   Ciclopirox 0.77 % gel APPLY ONE TIME DAILY ON THE AFFECTED TOE NAIL (Patient taking differently: Apply 1 application topically 2 (two) times daily as needed (affected toe nail). APPLY ONE TIME DAILY ON THE AFFECTED TOE NAIL.) 30 g 0   empagliflozin (JARDIANCE) 10 MG TABS tablet Take 1 tablet (10 mg total) by mouth daily before breakfast. 14 tablet 0   furosemide (LASIX) 20 MG tablet TAKE 1 TABLET EVERY DAY (Patient taking differently: Take 20 mg by mouth daily.) 90 tablet 3   insulin aspart (NOVOLOG) 100 UNIT/ML injection Inject 6 Units into the skin 3 (three) times daily before meals. (Patient taking differently: Inject 5 Units into the skin 3 (three) times daily before meals.)     LANTUS 100 UNIT/ML injection INJECT  40 UNITS SUBCUTANEOUSLY TWICE DAILY (Patient taking differently:  Inject 30 Units into the skin 2 (two) times daily.) 70 mL 1   levothyroxine (SYNTHROID) 50 MCG tablet Take 1 tablet (50 mcg total) by mouth daily before breakfast. 90 tablet 1   losartan (COZAAR) 25 MG tablet TAKE 1 TABLET EVERY DAY (Patient taking differently: Take 25 mg by mouth daily.) 90 tablet 2   Multiple Vitamin (MULTIVITAMIN) tablet Take 1 tablet by mouth daily.     omega-3 fish oil (MAXEPA) 1000 MG CAPS capsule Take 1 capsule by mouth daily.     polyethylene glycol (MIRALAX / GLYCOLAX) packet Take 17 g by mouth daily as needed for mild constipation.      spironolactone (ALDACTONE) 25 MG tablet TAKE 1 TABLET EVERY DAY (Patient taking differently: Take 25 mg by mouth daily.) 90 tablet 1   traMADol (ULTRAM) 50 MG tablet Take 1 tablet (50 mg total) by mouth every 6 (six) hours as needed. 20 tablet 0   No current facility-administered medications for this visit.    REVIEW OF SYSTEMS:   _0  denotes positive finding, _1  denotes negative finding Cardiac  Comments:  Chest pain or chest pressure:    Shortness of breath upon exertion:    Short of breath when lying flat:    Irregular heart rhythm:    Constitutional    Fever or chills:      PHYSICAL EXAM:   Vitals:   08/03/21 1456  BP: 95/62  Pulse:  87  Resp: 20  Temp: 97.9 F (36.6 C)  SpO2: 92%  Weight: 231 lb (104.8 kg)  Height: _0  (1.651 m)    GENERAL: The patient is a well-nourished female, in no acute distress. The vital signs are documented above. CARDIOVASCULAR: There is a regular rate and rhythm. PULMONARY: Non-labored respirations Slight fullness within the neck incision, consistent with hematoma.  The pocket incision is healing without issue.  There is no drainage or evidence of infection  STUDIES:   None   MEDICAL ISSUES:   I told the patient that the bulge in the right neck incision is likely a hematoma and will go away with time.  She has an appointment next week to have her device manipulated.  She  will follow-up with me as needed.  Leia Alf, MD, FACS [facility] Specialists of [facility] [phone] Pager [phone]

## 2021-08-04 ENCOUNTER — Ambulatory Visit (INDEPENDENT_AMBULATORY_CARE_PROVIDER_SITE_OTHER): Payer: Medicare HMO

## 2021-08-04 DIAGNOSIS — Z95810 Presence of automatic (implantable) cardiac defibrillator: Secondary | ICD-10-CM

## 2021-08-04 DIAGNOSIS — I5022 Chronic systolic (congestive) heart failure: Secondary | ICD-10-CM

## 2021-08-05 ENCOUNTER — Telehealth: Payer: Self-pay

## 2021-08-05 NOTE — Telephone Encounter (Signed)
Attempted ICM call to patient to request remote transmission for fluid level recheck.  She was sleeping and asked for call back later today since she will need assistance for manual transmission.

## 2021-08-07 NOTE — Progress Notes (Signed)
EPIC Encounter for ICM Monitoring  Patient Name: [name] is a 70 y.o. female Date: 08/07/2021 Primary Care Physican: Lindell Spar, MD Primary Cardiologist: Branch Electrophysiologist: Allred Bi-V Pacing:  99%        07/21/2021 Weight: 231 lbs (unable to weigh since surgery d/t dizziness.                                                             Spoke with patient and heart failure questions reviewed.  Pt reporting she is feeling better.  Dizziness and chest swelling have almost resolved after surgery.     Corvue Thoracic impedance suggesting dryness after restarting Furosemide and adjusting diet (limiting salt) and limiting fluids.   Prescribed:  Furosemide 20 mg take 1 tablet by mouth daily.   Spironolactone 25 mg take 1 tablet daily.   Labs: 07/15/2021 Creatinine 1.13, BUN 20, Potassium 4.0, Sodium 138, GFR 52 05/13/2021 Creatinine 1.09, BUN 19, Potassium 4.5, Sodium 138, GFR 54, NT-Pro BNP 476 03/14/2020 Creatinine 1.32, BUN 33, Potassium 5.5, Sodium 135, GFR 40-47 A complete set of results can be found in Results Review.   Recommendations:  Advised fluid levels may have been too limited since previous ICM check and to make sure she drinks 64 oz fluid daily to stay hydrated.     Follow-up plan: ICM clinic phone appointment on 08/31/2021.    91 day device clinic remote transmission 08/10/2021.        EP/Cardiology Office Visits: 09/17/2021 with Dr. Harl Bowie.  08/11/2021 with Oda Kilts, PA for Barostim follow up post implant.   Copy of ICM check sent to Dr. Rayann Heman and Dr Harl Bowie as Juluis Rainier.     3 month ICM trend: 08/07/2021.    12-14 Month ICM trend:       [name], RN 08/07/2021 12:16 PM

## 2021-08-10 ENCOUNTER — Ambulatory Visit (INDEPENDENT_AMBULATORY_CARE_PROVIDER_SITE_OTHER): Payer: Medicare HMO

## 2021-08-10 DIAGNOSIS — I255 Ischemic cardiomyopathy: Secondary | ICD-10-CM | POA: Diagnosis not present

## 2021-08-10 NOTE — Progress Notes (Signed)
Electrophysiology Office Note Date: 08/11/2021  ID:  LACOSTA HARGAN, DOB May 17, 1951, MRN [id number]  PCP: Lindell Spar, MD Primary Cardiologist: [name], MD Electrophysiologist: Thompson Grayer, MD   CC: Routine ICD follow-up  [name] is a 70 y.o. female seen today for Thompson Grayer, MD for routine electrophysiology followup.  Since last being seen in our clinic the patient reports doing about the same. She has had mild nausea at times. No fevers or chills.   Barostim implant 07/22/2021. Goals for titration are for to be able to clean her apartment without any help. Currently can clean a room prior to needing to take a break. Can't mop floors or vacuum without SOB.   Device History: Barostim Optometrist) implant 07/22/2021 [employer] CRT-D implanted 04/2019 for chronic systolic CHF History of appropriate therapy: No History of AAD therapy: No  Past Medical History:  Diagnosis Date   Anemia    Arthritis    CAD (coronary artery disease)    a. LHC (03/2014) Lmain: nl, LAD: diff dz proximal 20%, 30-40% dz mid vessel prior 2nd diagonal, LCx: 40% in OM1, 20-30% in OM2, RCA: 99% subtotal occlusion @ crux, TIMI 1 flow, L-R collaterals to distal vessel   Cardiomyopathy ([place]) 03/28/2014   LV dysfunction out of proportion to CAD 03/28/14   CHF (congestive heart failure) ([place])    Phreesia 31/51/7616   Chronic systolic heart failure ([place])    a. ECHO (03/2014): EF 25-30%, akinesis enteroanteroseptal myocardium, grade III DD b. RHC (03/2014) RA 4, RV 42/4, PA 44/14 (26), PCWP 21, PA 62% Fick CO/CI 6.9 / 3.4   Diabetes mellitus    Diabetes mellitus without complication ([place])    Phreesia 10/03/2020   Duodenal ulcer    Age 73   Dyspnea    Erosive esophagitis    Gastritis    Hypertension    Hypothyroidism    Obese    Short-term memory loss    Thyroid disease    TTP (thrombotic thrombocytopenic purpura) ([place]) 06/2005   Past Surgical History:  Procedure Laterality Date    BIV ICD INSERTION CRT-D N/A 05/03/2019   Procedure: BIV ICD INSERTION CRT-D;  Surgeon: Thompson Grayer, MD;  Location: [facility];  Service: Cardiovascular;  Laterality: N/A;   CARDIAC CATHETERIZATION     COLONOSCOPY  08/29/2012   WVP:[id number] diverticulosis   COLONOSCOPY N/A 05/28/2015   Procedure: COLONOSCOPY;  Surgeon: Daneil Dolin, MD;  [facility];  Service: Endoscopy;  Laterality: N/A;  1015   ECTOPIC PREGNANCY SURGERY     ESOPHAGOGASTRODUODENOSCOPY  04/20/2012   SWN:[id number] antral gastritis/Erosive reflux esophagitis   LEFT AND RIGHT HEART CATHETERIZATION WITH CORONARY ANGIOGRAM N/A 03/28/2014   Procedure: LEFT AND RIGHT HEART CATHETERIZATION WITH CORONARY ANGIOGRAM;  Surgeon: Peter M Martinique, MD;  Location: [facility]ATH LAB;  Service: Cardiovascular;  Laterality: N/A;   SPLENECTOMY  07/20/2005    Current Outpatient Medications  Medication Sig Dispense Refill   acetaminophen (TYLENOL) 500 MG tablet Take 1,000 mg by mouth every 6 (six) hours as needed for moderate pain.     aspirin 81 MG chewable tablet Chew 1 tablet (81 mg total) by mouth daily.     atorvastatin (LIPITOR) 80 MG tablet TAKE 1 TABLET EVERY DAY (Patient taking differently: Take 80 mg by mouth daily.) 90 tablet 1   blood glucose meter kit and supplies Dispense based on patient and insurance preference. Use up to four times daily as directed. (FOR ICD-10 E10.9, E11.9).  1 each 0   carvedilol (COREG) 25 MG tablet TAKE 1 TABLET TWICE DAILY (Patient taking differently: Take 25 mg by mouth 2 (two) times daily with a meal.) 180 tablet 3   Ciclopirox 0.77 % gel APPLY ONE TIME DAILY ON THE AFFECTED TOE NAIL (Patient taking differently: Apply 1 application topically 2 (two) times daily as needed (affected toe nail). APPLY ONE TIME DAILY ON THE AFFECTED TOE NAIL.) 30 g 0   empagliflozin (JARDIANCE) 10 MG TABS tablet Take 1 tablet (10 mg total) by mouth daily before breakfast. 14 tablet 0   furosemide (LASIX) 20 MG  tablet TAKE 1 TABLET EVERY DAY (Patient taking differently: Take 20 mg by mouth daily.) 90 tablet 3   insulin aspart (NOVOLOG) 100 UNIT/ML injection Inject 6 Units into the skin 3 (three) times daily before meals. (Patient taking differently: Inject 5 Units into the skin 3 (three) times daily before meals.)     LANTUS 100 UNIT/ML injection INJECT  40 UNITS SUBCUTANEOUSLY TWICE DAILY (Patient taking differently: Inject 30 Units into the skin 2 (two) times daily.) 70 mL 1   levothyroxine (SYNTHROID) 50 MCG tablet Take 1 tablet (50 mcg total) by mouth daily before breakfast. 90 tablet 1   losartan (COZAAR) 25 MG tablet TAKE 1 TABLET EVERY DAY (Patient taking differently: Take 25 mg by mouth daily.) 90 tablet 2   Multiple Vitamin (MULTIVITAMIN) tablet Take 1 tablet by mouth daily.     omega-3 fish oil (MAXEPA) 1000 MG CAPS capsule Take 1 capsule by mouth daily.     polyethylene glycol (MIRALAX / GLYCOLAX) packet Take 17 g by mouth daily as needed for mild constipation.      spironolactone (ALDACTONE) 25 MG tablet TAKE 1 TABLET EVERY DAY (Patient taking differently: Take 25 mg by mouth daily.) 90 tablet 1   traMADol (ULTRAM) 50 MG tablet Take 1 tablet (50 mg total) by mouth every 6 (six) hours as needed. 20 tablet 0   No current facility-administered medications for this visit.    Allergies:   Other and Lisinopril   Social History: Social History   Socioeconomic History   Marital status: Divorced    Spouse name: Not on file   Number of children: 3   Years of education: Not on file   Highest education level: Not on file  Occupational History   Occupation: disabled    Employer: NOT EMPLOYED  Tobacco Use   Smoking status: Former    Packs/day: 0.25    Years: 10.00    Pack years: 2.50    Types: Cigarettes    Start date: 09/20/1966    Quit date: 09/20/1978    Years since quitting: 42.9   Smokeless tobacco: Never  Vaping Use   Vaping Use: Never used  Substance and Sexual Activity   Alcohol  use: No    Alcohol/week: 0.0 standard drinks   Drug use: No   Sexual activity: Not on file  Other Topics Concern   Not on file  Social History Narrative   Lives Alone       Social Determinants of Health   Financial Resource Strain: Low Risk    Difficulty of Paying Living Expenses: Not hard at all  Food Insecurity: No Food Insecurity   Worried About Charity fundraiser in the Last Year: Never true   [place] in the Last Year: Never true  Transportation Needs: No Transportation Needs   Lack of Transportation (Medical): No   Lack of Transportation (  Non-Medical): No  Physical Activity: Insufficiently Active   Days of Exercise per Week: 2 days   Minutes of Exercise per Session: 20 min  Stress: No Stress Concern Present   Feeling of Stress : Not at all  Social Connections: Socially Isolated   Frequency of Communication with Friends and Family: Never   Frequency of Social Gatherings with Friends and Family: Never   Attends Religious Services: More than 4 times per year   Active Member of [employer] or Organizations: No   Attends Music therapist: Never   Marital Status: Divorced  Human resources officer Violence: Not At Risk   Fear of Current or Ex-Partner: No   Emotionally Abused: No   Physically Abused: No   Sexually Abused: No    Family History: Family History  Problem Relation Age of Onset   Heart failure Mother    Cirrhosis Father        ETOH   Diabetes Daughter    Breast cancer Maternal Aunt    Colon cancer Neg Hx     Review of Systems: All other systems reviewed and are otherwise negative except as noted above.   Physical Exam: Vitals:   08/11/21 1143  BP: 108/64  Pulse: 91  SpO2: 96%  Weight: 229 lb 12.8 oz (104.2 kg)  Height: _0  (1.651 m)     GEN- The patient is well appearing, alert and oriented x 3 today.   HEENT: normocephalic, atraumatic; sclera clear, conjunctiva pink; hearing intact; oropharynx clear; neck supple, no JVP Lymph- no  cervical lymphadenopathy Lungs- Clear to ausculation bilaterally, normal work of breathing.  No wheezes, rales, rhonchi Heart- Regular rate and rhythm, no murmurs, rubs or gallops, PMI not laterally displaced GI- soft, non-tender, non-distended, bowel sounds present, no hepatosplenomegaly Extremities- no clubbing or cyanosis. No edema; DP/PT/radial pulses 2+ bilaterally MS- no significant deformity or atrophy Skin- warm and dry, no rash or lesion; ICD pocket well healed Psych- euthymic mood, full affect Neuro- strength and sensation are intact  ICD interrogation- reviewed in detail today,  See PACEART report  EKG:  EKG is not ordered today.  Recent Labs: 05/13/2021: NT-Pro BNP 476 07/15/2021: ALT 39; BUN 20; Creatinine, Ser 1.13; Hemoglobin 11.0; Platelets 150; Potassium 4.0; Sodium 138   Wt Readings from Last 3 Encounters:  08/11/21 229 lb 12.8 oz (104.2 kg)  08/03/21 231 lb (104.8 kg)  07/22/21 231 lb (104.8 kg)     Other studies Reviewed: Additional studies/ records that were reviewed today include: Previous EP office notes.   Assessment and Plan:  1.  Chronic systolic dysfunction s/p St. Jude CRT-D  euvolemic today Stable on an appropriate medical regimen Normal ICD function See Pace Art report No changes today Barostim device titrated from 1.0 ma to 3.0 ma in 0.4 ma increments without stim RTC 2-3 weeks for further titration Watch fluid status on jardiance, lasix, and spiro.  2. HTN Stable on current regimen   3. CAD Denies s/s ischemia  Current medicines are reviewed at length with the patient today.    Disposition:   Follow up with EP APP in 2 weeks    Signed, Shirley Friar, PA-C  08/11/2021 12:03 PM  [place] [address] [phone] (office) [phone] (fax)

## 2021-08-11 ENCOUNTER — Other Ambulatory Visit: Payer: Self-pay

## 2021-08-11 ENCOUNTER — Encounter: Payer: Self-pay | Admitting: Student

## 2021-08-11 ENCOUNTER — Ambulatory Visit (INDEPENDENT_AMBULATORY_CARE_PROVIDER_SITE_OTHER): Payer: Medicare HMO | Admitting: Student

## 2021-08-11 VITALS — BP 108/64 | HR 91 | Ht 65.0 in | Wt 229.8 lb

## 2021-08-11 DIAGNOSIS — I5022 Chronic systolic (congestive) heart failure: Secondary | ICD-10-CM

## 2021-08-11 DIAGNOSIS — I10 Essential (primary) hypertension: Secondary | ICD-10-CM | POA: Diagnosis not present

## 2021-08-11 DIAGNOSIS — Z95810 Presence of automatic (implantable) cardiac defibrillator: Secondary | ICD-10-CM

## 2021-08-11 LAB — CUP PACEART REMOTE DEVICE CHECK
Battery Remaining Longevity: 41 mo
Battery Remaining Percentage: 62 %
Battery Voltage: 2.96 V
Brady Statistic AP VP Percent: 1 %
Brady Statistic AP VS Percent: 1 %
Brady Statistic AS VP Percent: 98 %
Brady Statistic AS VS Percent: 1 %
Brady Statistic RA Percent Paced: 1 %
Date Time Interrogation Session: 20221118120651
HighPow Impedance: 62 Ohm
HighPow Impedance: 62 Ohm
Implantable Lead Implant Date: 20200813
Implantable Lead Implant Date: 20200813
Implantable Lead Implant Date: 20200813
Implantable Lead Location: 753858
Implantable Lead Location: 753859
Implantable Lead Location: 753860
Implantable Pulse Generator Implant Date: 20200813
Lead Channel Impedance Value: 340 Ohm
Lead Channel Impedance Value: 390 Ohm
Lead Channel Impedance Value: 400 Ohm
Lead Channel Pacing Threshold Amplitude: 0.5 V
Lead Channel Pacing Threshold Amplitude: 0.625 V
Lead Channel Pacing Threshold Amplitude: 1.5 V
Lead Channel Pacing Threshold Pulse Width: 0.4 ms
Lead Channel Pacing Threshold Pulse Width: 0.5 ms
Lead Channel Pacing Threshold Pulse Width: 0.6 ms
Lead Channel Sensing Intrinsic Amplitude: 11.8 mV
Lead Channel Sensing Intrinsic Amplitude: 5 mV
Lead Channel Setting Pacing Amplitude: 2 V
Lead Channel Setting Pacing Amplitude: 2 V
Lead Channel Setting Pacing Amplitude: 2.5 V
Lead Channel Setting Pacing Pulse Width: 0.5 ms
Lead Channel Setting Pacing Pulse Width: 0.6 ms
Lead Channel Setting Sensing Sensitivity: 0.5 mV
Pulse Gen Serial Number: 9891600

## 2021-08-11 NOTE — Patient Instructions (Signed)
Medication Instructions:  Your physician recommends that you continue on your current medications as directed. Please refer to the Current Medication list given to you today.  *If you need a refill on your cardiac medications before your next appointment, please call your pharmacy*   Lab Work: None If you have labs (blood work) drawn today and your tests are completely normal, you will receive your results only by: [place] (if you have MyChart) OR A paper copy in the mail If you have any lab test that is abnormal or we need to change your treatment, we will call you to review the results.   Follow-Up: At [facility], you and your health needs are our priority.  As part of our continuing mission to provide you with exceptional heart care, we have created designated Provider Care Teams.  These Care Teams include your primary Cardiologist (physician) and Advanced Practice Providers (APPs -  Physician Assistants and Nurse Practitioners) who all work together to provide you with the care you need, when you need it.  We recommend signing up for the patient portal called "MyChart".  Sign up information is provided on this After Visit Summary.  MyChart is used to connect with patients for Virtual Visits (Telemedicine).  Patients are able to view lab/test results, encounter notes, upcoming appointments, etc.  Non-urgent messages can be sent to your provider as well.   To learn more about what you can do with MyChart, go to [email or web address].    Your next appointment:   As scheduled

## 2021-08-20 NOTE — Progress Notes (Signed)
Remote ICD transmission.   

## 2021-08-25 NOTE — Progress Notes (Signed)
Electrophysiology Office Note Date: 08/25/2021  ID:  [name], DOB 05-Aug-1951, MRN [id number]  PCP: Lindell Spar, MD Primary Cardiologist: [name], MD Electrophysiologist: Thompson Grayer, MD   CC: Routine ICD follow-up  [name] is a 70 y.o. female seen today for Thompson Grayer, MD for routine electrophysiology followup.  Barostim implant 07/22/2021.   At last visit Barostim was titrated from 1.0 ma to 3.0 ma without stim.   Since last being seen in our clinic the patient reports her breathing has improved. She is able to move more freely around the house and do her ADLs without the SOB she was previously having. Denies dizziness or lightheadedness. Optivol has decreased since implant.   Goals for titration are for to be able to clean her apartment without any help. Currently can clean a room prior to needing to take a break. Can't mop floors or vacuum without SOB.   Device History: Barostim Optometrist) implant 07/22/2021 [employer] CRT-D implanted 04/2019 for chronic systolic CHF History of appropriate therapy: No History of AAD therapy: No  Past Medical History:  Diagnosis Date   Anemia    Arthritis    CAD (coronary artery disease)    a. LHC (03/2014) Lmain: nl, LAD: diff dz proximal 20%, 30-40% dz mid vessel prior 2nd diagonal, LCx: 40% in OM1, 20-30% in OM2, RCA: 99% subtotal occlusion @ crux, TIMI 1 flow, L-R collaterals to distal vessel   Cardiomyopathy ([place]) 03/28/2014   LV dysfunction out of proportion to CAD 03/28/14   CHF (congestive heart failure) ([place])    Phreesia 72/53/6644   Chronic systolic heart failure ([place])    a. ECHO (03/2014): EF 25-30%, akinesis enteroanteroseptal myocardium, grade III DD b. RHC (03/2014) RA 4, RV 42/4, PA 44/14 (26), PCWP 21, PA 62% Fick CO/CI 6.9 / 3.4   Diabetes mellitus    Diabetes mellitus without complication ([place])    Phreesia 10/03/2020   Duodenal ulcer    Age 24   Dyspnea    Erosive esophagitis     Gastritis    Hypertension    Hypothyroidism    Obese    Short-term memory loss    Thyroid disease    TTP (thrombotic thrombocytopenic purpura) ([place]) 06/2005   Past Surgical History:  Procedure Laterality Date   BIV ICD INSERTION CRT-D N/A 05/03/2019   Procedure: BIV ICD INSERTION CRT-D;  Surgeon: Thompson Grayer, MD;  Location: [facility];  Service: Cardiovascular;  Laterality: N/A;   CARDIAC CATHETERIZATION     COLONOSCOPY  08/29/2012   IHK:[id number] diverticulosis   COLONOSCOPY N/A 05/28/2015   Procedure: COLONOSCOPY;  Surgeon: Daneil Dolin, MD;  [facility];  Service: Endoscopy;  Laterality: N/A;  1015   ECTOPIC PREGNANCY SURGERY     ESOPHAGOGASTRODUODENOSCOPY  04/20/2012   LOV:FIEPPIR antral gastritis/Erosive reflux esophagitis   LEFT AND RIGHT HEART CATHETERIZATION WITH CORONARY ANGIOGRAM N/A 03/28/2014   Procedure: LEFT AND RIGHT HEART CATHETERIZATION WITH CORONARY ANGIOGRAM;  Surgeon: Peter M Martinique, MD;  Location: [facility] CATH LAB;  Service: Cardiovascular;  Laterality: N/A;   SPLENECTOMY  07/20/2005    Current Outpatient Medications  Medication Sig Dispense Refill   acetaminophen (TYLENOL) 500 MG tablet Take 1,000 mg by mouth every 6 (six) hours as needed for moderate pain.     aspirin 81 MG chewable tablet Chew 1 tablet (81 mg total) by mouth daily.     atorvastatin (LIPITOR) 80 MG tablet TAKE 1 TABLET EVERY DAY (Patient taking  differently: Take 80 mg by mouth daily.) 90 tablet 1   blood glucose meter kit and supplies Dispense based on patient and insurance preference. Use up to four times daily as directed. (FOR ICD-10 E10.9, E11.9). 1 each 0   carvedilol (COREG) 25 MG tablet TAKE 1 TABLET TWICE DAILY (Patient taking differently: Take 25 mg by mouth 2 (two) times daily with a meal.) 180 tablet 3   Ciclopirox 0.77 % gel APPLY ONE TIME DAILY ON THE AFFECTED TOE NAIL (Patient taking differently: Apply 1 application topically 2 (two) times daily as needed (affected  toe nail). APPLY ONE TIME DAILY ON THE AFFECTED TOE NAIL.) 30 g 0   empagliflozin (JARDIANCE) 10 MG TABS tablet Take 1 tablet (10 mg total) by mouth daily before breakfast. 14 tablet 0   furosemide (LASIX) 20 MG tablet TAKE 1 TABLET EVERY DAY (Patient taking differently: Take 20 mg by mouth daily.) 90 tablet 3   insulin aspart (NOVOLOG) 100 UNIT/ML injection Inject 6 Units into the skin 3 (three) times daily before meals. (Patient taking differently: Inject 5 Units into the skin 3 (three) times daily before meals.)     LANTUS 100 UNIT/ML injection INJECT  40 UNITS SUBCUTANEOUSLY TWICE DAILY (Patient taking differently: Inject 30 Units into the skin 2 (two) times daily.) 70 mL 1   levothyroxine (SYNTHROID) 50 MCG tablet Take 1 tablet (50 mcg total) by mouth daily before breakfast. 90 tablet 1   losartan (COZAAR) 25 MG tablet TAKE 1 TABLET EVERY DAY (Patient taking differently: Take 25 mg by mouth daily.) 90 tablet 2   Multiple Vitamin (MULTIVITAMIN) tablet Take 1 tablet by mouth daily.     omega-3 fish oil (MAXEPA) 1000 MG CAPS capsule Take 1 capsule by mouth daily.     polyethylene glycol (MIRALAX / GLYCOLAX) packet Take 17 g by mouth daily as needed for mild constipation.      spironolactone (ALDACTONE) 25 MG tablet TAKE 1 TABLET EVERY DAY (Patient taking differently: Take 25 mg by mouth daily.) 90 tablet 1   traMADol (ULTRAM) 50 MG tablet Take 1 tablet (50 mg total) by mouth every 6 (six) hours as needed. 20 tablet 0   No current facility-administered medications for this visit.    Allergies:   Other and Lisinopril   Social History: Social History   Socioeconomic History   Marital status: Divorced    Spouse name: Not on file   Number of children: 3   Years of education: Not on file   Highest education level: Not on file  Occupational History   Occupation: disabled    Employer: NOT EMPLOYED  Tobacco Use   Smoking status: Former    Packs/day: 0.25    Years: 10.00    Pack years: 2.50     Types: Cigarettes    Start date: 09/20/1966    Quit date: 09/20/1978    Years since quitting: 42.9   Smokeless tobacco: Never  Vaping Use   Vaping Use: Never used  Substance and Sexual Activity   Alcohol use: No    Alcohol/week: 0.0 standard drinks   Drug use: No   Sexual activity: Not on file  Other Topics Concern   Not on file  Social History Narrative   Lives Alone       Social Determinants of Health   Financial Resource Strain: Low Risk    Difficulty of Paying Living Expenses: Not hard at all  Food Insecurity: No Food Insecurity   Worried About Running Out of  Food in the Last Year: Never true   [place] in the Last Year: Never true  Transportation Needs: No Transportation Needs   Lack of Transportation (Medical): No   Lack of Transportation (Non-Medical): No  Physical Activity: Insufficiently Active   Days of Exercise per Week: 2 days   Minutes of Exercise per Session: 20 min  Stress: No Stress Concern Present   Feeling of Stress : Not at all  Social Connections: Socially Isolated   Frequency of Communication with Friends and Family: Never   Frequency of Social Gatherings with Friends and Family: Never   Attends Religious Services: More than 4 times per year   Active Member of [employer] or Organizations: No   Attends Music therapist: Never   Marital Status: Divorced  Human resources officer Violence: Not At Risk   Fear of Current or Ex-Partner: No   Emotionally Abused: No   Physically Abused: No   Sexually Abused: No    Family History: Family History  Problem Relation Age of Onset   Heart failure Mother    Cirrhosis Father        ETOH   Diabetes Daughter    Breast cancer Maternal Aunt    Colon cancer Neg Hx     Review of Systems: Review of systems complete and found to be negative unless listed in HPI.     Physical Exam: There were no vitals filed for this visit.   General:  Well appearing. No resp difficulty. HEENT: Normal Neck:  Supple. JVP 5-6. Carotids 2+ bilat; no bruits. No thyromegaly or nodule noted. Cor: PMI nondisplaced. RRR, No M/G/R noted Lungs: CTAB, normal effort. Abdomen: Soft, non-tender, non-distended, no HSM. No bruits or masses. +BS   Extremities: No cyanosis, clubbing, or rash. R and LLE no edema.  Neuro: Alert & orientedx3, cranial nerves grossly intact. moves all 4 extremities w/o difficulty. Affect pleasant   Barostim interrogation- Performed personally. See attached report.    EKG:  EKG is not ordered today.  Recent Labs: 05/13/2021: NT-Pro BNP 476 07/15/2021: ALT 39; BUN 20; Creatinine, Ser 1.13; Hemoglobin 11.0; Platelets 150; Potassium 4.0; Sodium 138   Wt Readings from Last 3 Encounters:  08/11/21 229 lb 12.8 oz (104.2 kg)  08/03/21 231 lb (104.8 kg)  07/22/21 231 lb (104.8 kg)     Other studies Reviewed: Additional studies/ records that were reviewed today include: Previous EP office notes.   Assessment and Plan:  1.  Chronic systolic dysfunction s/p St. Jude CRT-D  Volume status stable on exam. Stable on an appropriate medical regimen Normal ICD function See Pace Art report No changes today Barostim device titrated from 3.0 ma to 5.0 ma in 0.4 ma increments without stim RTC 2-3 weeks for further titration Watch fluid status on jardiance, lasix, and spiro.  2. HTN Low today.  Hold lasix 2 days, then resume every OTHER day. Labs today.   3. CAD Denies s/s ischemia  Current medicines are reviewed at length with the patient today.    Disposition:   Follow up with EP APP in 2 weeks for likely final titration.     Signed, Shirley Friar, PA-C  08/25/2021 2:26 PM  [address] [place] [address] [phone] (office) [phone] (fax)

## 2021-08-26 ENCOUNTER — Encounter: Payer: Self-pay | Admitting: Student

## 2021-08-26 ENCOUNTER — Other Ambulatory Visit: Payer: Self-pay

## 2021-08-26 ENCOUNTER — Ambulatory Visit (INDEPENDENT_AMBULATORY_CARE_PROVIDER_SITE_OTHER): Payer: Medicare HMO | Admitting: Student

## 2021-08-26 VITALS — BP 88/52 | Ht 65.0 in | Wt 228.0 lb

## 2021-08-26 DIAGNOSIS — I2510 Atherosclerotic heart disease of native coronary artery without angina pectoris: Secondary | ICD-10-CM | POA: Diagnosis not present

## 2021-08-26 DIAGNOSIS — I5022 Chronic systolic (congestive) heart failure: Secondary | ICD-10-CM | POA: Diagnosis not present

## 2021-08-26 DIAGNOSIS — I10 Essential (primary) hypertension: Secondary | ICD-10-CM

## 2021-08-26 MED ORDER — FUROSEMIDE 20 MG PO TABS: 20.0000 mg | ORAL_TABLET | ORAL | 3 refills | Status: DC

## 2021-08-26 NOTE — Patient Instructions (Signed)
Medication Instructions:  Your physician has recommended you make the following change in your medication:   FUROSEMIDE: take 1 tablet every other day starting Saturday 08/29/21.  *If you need a refill on your cardiac medications before your next appointment, please call your pharmacy*   Lab Work: TODAY: BMET  If you have labs (blood work) drawn today and your tests are completely normal, you will receive your results only by: [place] (if you have MyChart) OR A paper copy in the mail If you have any lab test that is abnormal or we need to change your treatment, we will call you to review the results.   Follow-Up: At [facility], you and your health needs are our priority.  As part of our continuing mission to provide you with exceptional heart care, we have created designated Provider Care Teams.  These Care Teams include your primary Cardiologist (physician) and Advanced Practice Providers (APPs -  Physician Assistants and Nurse Practitioners) who all work together to provide you with the care you need, when you need it.  We recommend signing up for the patient portal called "MyChart".  Sign up information is provided on this After Visit Summary.  MyChart is used to connect with patients for Virtual Visits (Telemedicine).  Patients are able to view lab/test results, encounter notes, upcoming appointments, etc.  Non-urgent messages can be sent to your provider as well.   To learn more about what you can do with MyChart, go to [email or web address].    Your next appointment:   As scheduled

## 2021-08-27 LAB — BASIC METABOLIC PANEL
BUN/Creatinine Ratio: 18 (ref 12–28)
BUN: 26 mg/dL (ref 8–27)
CO2: 21 mmol/L (ref 20–29)
Calcium: 9.2 mg/dL (ref 8.7–10.3)
Chloride: 101 mmol/L (ref 96–106)
Creatinine, Ser: 1.42 mg/dL — ABNORMAL HIGH (ref 0.57–1.00)
Glucose: 130 mg/dL — ABNORMAL HIGH (ref 70–99)
Potassium: 5 mmol/L (ref 3.5–5.2)
Sodium: 138 mmol/L (ref 134–144)
eGFR: 39 mL/min/{1.73_m2} — ABNORMAL LOW (ref >59)

## 2021-08-31 ENCOUNTER — Ambulatory Visit: Payer: Medicare HMO | Admitting: Surgery

## 2021-08-31 ENCOUNTER — Ambulatory Visit (INDEPENDENT_AMBULATORY_CARE_PROVIDER_SITE_OTHER): Payer: Medicare HMO

## 2021-08-31 DIAGNOSIS — I5022 Chronic systolic (congestive) heart failure: Secondary | ICD-10-CM | POA: Diagnosis not present

## 2021-08-31 DIAGNOSIS — Z95810 Presence of automatic (implantable) cardiac defibrillator: Secondary | ICD-10-CM

## 2021-08-31 NOTE — Progress Notes (Incomplete)
[facility]in [facility]ist [facility]  Patient name: [name] MRN: [id number] DOB: [birth year] Sex: female   REQUESTING PROVIDER:    Dr. Harl Bowie   REASON FOR CONSULT:    Abnormal ABI's  HISTORY OF PRESENT ILLNESS:   [name] is a 70 y.o. female, who is ***  PAST MEDICAL HISTORY    Past Medical History:  Diagnosis Date   Anemia    Arthritis    CAD (coronary artery disease)    a. LHC (03/2014) [place]: nl, LAD: diff dz proximal 20%, 30-40% dz mid vessel prior 2nd diagonal, LCx: 40% in OM1, 20-30% in OM2, RCA: 99% subtotal occlusion @ crux, TIMI 1 flow, L-R collaterals to distal vessel   Cardiomyopathy ([place]) 03/28/2014   LV dysfunction out of proportion to CAD 03/28/14   CHF (congestive heart failure) ([place])    Phreesia 01/56/1537   Chronic systolic heart failure ([place])    a. ECHO (03/2014): EF 25-30%, akinesis enteroanteroseptal myocardium, grade III DD b. RHC (03/2014) RA 4, RV 42/4, PA 44/14 (26), PCWP 21, PA 62% Fick CO/CI 6.9 / 3.4   Diabetes mellitus    Diabetes mellitus without complication ([place])    Phreesia 10/03/2020   Duodenal ulcer    Age 54   Dyspnea    Erosive esophagitis    Gastritis    Hypertension    Hypothyroidism    Obese    Short-term memory loss    Thyroid disease    TTP (thrombotic thrombocytopenic purpura) ([place]) 06/2005     FAMILY HISTORY   Family History  Problem Relation Age of Onset   Heart failure Mother    Cirrhosis Father        ETOH   Diabetes Daughter    Breast cancer Maternal Aunt    Colon cancer Neg Hx     SOCIAL HISTORY:   Social History   Socioeconomic History   Marital status: Divorced    Spouse name: Not on file   Number of children: 3   Years of education: Not on file   Highest education level: Not on file  Occupational History   Occupation: disabled    Employer: NOT EMPLOYED  Tobacco Use   Smoking status: Former    Packs/day: 0.25    Years: 10.00    Pack years:  2.50    Types: Cigarettes    Start date: 09/20/1966    Quit date: 09/20/1978    Years since quitting: 42.9   Smokeless tobacco: Never  Vaping Use   Vaping Use: Never used  Substance and Sexual Activity   Alcohol use: No    Alcohol/week: 0.0 standard drinks   Drug use: No   Sexual activity: Not on file  Other Topics Concern   Not on file  Social History Narrative   Lives Alone       Social Determinants of Health   Financial Resource Strain: Not on file  Food Insecurity: Not on file  Transportation Needs: Not on file  Physical Activity: Not on file  Stress: Not on file  Social Connections: Not on file  Intimate Partner Violence: Not on file    ALLERGIES:    Allergies  Allergen Reactions   Other Other (See Comments)    FLAGYL, CIPRO, PROTONIX taken at the same time caused patient to lose her breath and have trouble breathing, requiring hospital stay at [facility], 03/23/14-per patient.    Lisinopril Cough    CURRENT MEDICATIONS:    Current Outpatient Medications  Medication Sig Dispense Refill   acetaminophen (TYLENOL) 500 MG tablet Take 1,000 mg by mouth every 6 (six) hours as needed for moderate pain.     aspirin 81 MG chewable tablet Chew 1 tablet (81 mg total) by mouth daily.     atorvastatin (LIPITOR) 80 MG tablet TAKE 1 TABLET EVERY DAY (Patient taking differently: Take 80 mg by mouth daily.) 90 tablet 1   blood glucose meter kit and supplies Dispense based on patient and insurance preference. Use up to four times daily as directed. (FOR ICD-10 E10.9, E11.9). 1 each 0   carvedilol (COREG) 25 MG tablet TAKE 1 TABLET TWICE DAILY (Patient taking differently: Take 25 mg by mouth 2 (two) times daily with a meal.) 180 tablet 3   Ciclopirox 0.77 % gel APPLY ONE TIME DAILY ON THE AFFECTED TOE NAIL (Patient taking differently: Apply 1 application topically 2 (two) times daily as needed (affected toe nail). APPLY ONE TIME DAILY ON THE AFFECTED TOE NAIL.) 30 g 0   empagliflozin  (JARDIANCE) 10 MG TABS tablet Take 1 tablet (10 mg total) by mouth daily before breakfast. 14 tablet 0   furosemide (LASIX) 20 MG tablet Take 1 tablet (20 mg total) by mouth every other day. 45 tablet 3   insulin aspart (NOVOLOG) 100 UNIT/ML injection Inject 6 Units into the skin 3 (three) times daily before meals. (Patient taking differently: Inject 5 Units into the skin 3 (three) times daily before meals.)     LANTUS 100 UNIT/ML injection INJECT  40 UNITS SUBCUTANEOUSLY TWICE DAILY (Patient taking differently: Inject 30 Units into the skin 2 (two) times daily.) 70 mL 1   levothyroxine (SYNTHROID) 50 MCG tablet Take 1 tablet (50 mcg total) by mouth daily before breakfast. 90 tablet 1   losartan (COZAAR) 25 MG tablet TAKE 1 TABLET EVERY DAY (Patient taking differently: Take 25 mg by mouth daily.) 90 tablet 2   Multiple Vitamin (MULTIVITAMIN) tablet Take 1 tablet by mouth daily.     omega-3 fish oil (MAXEPA) 1000 MG CAPS capsule Take 1 capsule by mouth daily.     polyethylene glycol (MIRALAX / GLYCOLAX) packet Take 17 g by mouth daily as needed for mild constipation.      spironolactone (ALDACTONE) 25 MG tablet TAKE 1 TABLET EVERY DAY (Patient taking differently: Take 25 mg by mouth daily.) 90 tablet 1   traMADol (ULTRAM) 50 MG tablet Take 1 tablet (50 mg total) by mouth every 6 (six) hours as needed. 20 tablet 0   No current facility-administered medications for this visit.    REVIEW OF SYSTEMS:   [X]  denotes positive finding, [ ]  denotes negative finding Cardiac  Comments:  Chest pain or chest pressure: ***   Shortness of breath upon exertion:    Short of breath when lying flat:    Irregular heart rhythm:        Vascular    Pain in calf, thigh, or hip brought on by ambulation:    Pain in feet at night that wakes you up from your sleep:     Blood clot in your veins:    Leg swelling:         Pulmonary    Oxygen at home:    Productive cough:     Wheezing:         Neurologic     Sudden weakness in arms or legs:     Sudden numbness in arms or legs:     Sudden onset of difficulty speaking  or slurred speech:    Temporary loss of vision in one eye:     Problems with dizziness:         Gastrointestinal    Blood in stool:      Vomited blood:         Genitourinary    Burning when urinating:     Blood in urine:        Psychiatric    Major depression:         Hematologic    Bleeding problems:    Problems with blood clotting too easily:        Skin    Rashes or ulcers:        Constitutional    Fever or chills:     PHYSICAL EXAM:   There were no vitals filed for this visit.  GENERAL: The patient is a well-nourished female, in no acute distress. The vital signs are documented above. CARDIAC: There is a regular rate and rhythm.  VASCULAR: *** PULMONARY: Nonlabored respirations ABDOMEN: Soft and non-tender with normal pitched bowel sounds.  MUSCULOSKELETAL: There are no major deformities or cyanosis. NEUROLOGIC: No focal weakness or paresthesias are detected. SKIN: There are no ulcers or rashes noted. PSYCHIATRIC: The patient has a normal affect.  STUDIES:   I have reviewed the following duplex studies: Right Carotid: There is no evidence of stenosis in the right ICA.   Left Carotid: There is no evidence of stenosis in the left ICA.   Vertebrals:  Bilateral vertebral arteries demonstrate antegrade flow.  Subclavians: Normal flow hemodynamics were seen in bilateral subclavian               arteries.   ASSESSMENT and PLAN   ***   Leia Alf, MD, FACS [facility] [phone] Pager [phone]

## 2021-09-04 NOTE — Progress Notes (Signed)
EPIC Encounter for ICM Monitoring  Patient Name: [name] is a 70 y.o. female Date: 09/04/2021 Primary Care Physican: Lindell Spar, MD Primary Cardiologist: Branch Electrophysiologist: Allred Bi-V Pacing:  99%        09/04/2021 Weight: 228 lbs                                                            Spoke with patient and heart failure questions reviewed.  Pt reporting she is feeling okay.  She did have some swelling in the last week but has resolved.    [place] Thoracic impedance suggesting normal but was suggesting dryness 11/13-11/29.   Prescribed:  Furosemide 20 mg take 1 tablet by mouth every other day.   Spironolactone 25 mg take 1 tablet daily.   Labs: 07/15/2021 Creatinine 1.13, BUN 20, Potassium 4.0, Sodium 138, GFR 52 05/13/2021 Creatinine 1.09, BUN 19, Potassium 4.5, Sodium 138, GFR 54, NT-Pro BNP 476 03/14/2020 Creatinine 1.32, BUN 33, Potassium 5.5, Sodium 135, GFR 40-47 A complete set of results can be found in Results Review.   Recommendations:  No changes and encouraged to call if experiencing any fluid symptoms.   Follow-up plan: ICM clinic phone appointment on 10/05/2020.    91 day device clinic remote transmission 11/09/2021.        EP/Cardiology Office Visits: 09/17/2021 with Dr. Harl Bowie.  [id number] with Oda Kilts, PA.    Copy of ICM check sent to Dr. Rayann Heman.  3 month ICM trend: 08/31/2021.    12-14 Month ICM trend:       [name], RN 09/04/2021 2:36 PM

## 2021-09-07 ENCOUNTER — Ambulatory Visit (INDEPENDENT_AMBULATORY_CARE_PROVIDER_SITE_OTHER): Payer: Medicare HMO

## 2021-09-07 ENCOUNTER — Other Ambulatory Visit: Payer: Self-pay

## 2021-09-07 DIAGNOSIS — Z131 Encounter for screening for diabetes mellitus: Secondary | ICD-10-CM

## 2021-09-07 DIAGNOSIS — Z0000 Encounter for general adult medical examination without abnormal findings: Secondary | ICD-10-CM | POA: Diagnosis not present

## 2021-09-07 DIAGNOSIS — Z136 Encounter for screening for cardiovascular disorders: Secondary | ICD-10-CM | POA: Diagnosis not present

## 2021-09-07 DIAGNOSIS — E1140 Type 2 diabetes mellitus with diabetic neuropathy, unspecified: Secondary | ICD-10-CM | POA: Diagnosis not present

## 2021-09-07 NOTE — Patient Instructions (Addendum)
°  [name] , Thank you for taking time to come for your Medicare Wellness Visit. I appreciate your ongoing commitment to your health goals. Please review the following plan we discussed and let me know if I can assist you in the future.   These are the goals we discussed:  Goals      Weight (lb) < 200 lb (90.7 kg)     Would like to lose 50 to 75lbs         This is a list of the screening recommended for you and due dates:  Health Maintenance  Topic Date Due   Tetanus Vaccine  Never done   Zoster (Shingles) Vaccine (1 of 2) Never done   Pneumonia Vaccine (2 - PCV) 04/19/2013   COVID-19 Vaccine (3 - Booster for Moderna series) 02/27/2020   Hemoglobin A1C  01/07/2021   Complete foot exam   03/07/2021   Flu Shot  04/20/2021   Eye exam for diabetics  02/17/2022   Mammogram  03/14/2022   Colon Cancer Screening  05/27/2025   DEXA scan (bone density measurement)  Completed   Hepatitis C Screening: USPSTF Recommendation to screen - Ages 37-79 yo.  Completed   HPV Vaccine  Aged Out

## 2021-09-07 NOTE — Progress Notes (Signed)
Subjective:  I connected with  [name] on 09/07/21 by a audio enabled telemedicine application and verified that I am speaking with the correct person using two identifiers.  Patient Location: Home  Provider Location: Office/Clinic  I discussed the limitations of evaluation and management by telemedicine. The patient expressed understanding and agreed to proceed.   [name] is a 70 y.o. female who presents for Medicare Annual (Subsequent) preventive examination.  Review of Systems    Defer to PCP       Objective:    There were no vitals filed for this visit. There is no height or weight on file to calculate BMI.  Advanced Directives 09/07/2021 07/22/2021 07/15/2021 08/28/2020 05/03/2019 07/17/2017 10/14/2016  Does Patient Have a Medical Advance Directive? No No No No No No No  Would patient like information on creating a medical advance directive? Yes (ED - Information included in AVS) No - Patient declined No - Patient declined Yes (MAU/Ambulatory/Procedural Areas - Information given) Yes (MAU/Ambulatory/Procedural Areas - Information given) No - Patient declined No - Patient declined  Pre-existing out of facility DNR order (yellow form or pink MOST form) - - - - - - -    Current Medications (verified) Outpatient Encounter Medications as of 09/07/2021  Medication Sig   acetaminophen (TYLENOL) 500 MG tablet Take 1,000 mg by mouth every 6 (six) hours as needed for moderate pain.   aspirin 81 MG chewable tablet Chew 1 tablet (81 mg total) by mouth daily.   atorvastatin (LIPITOR) 80 MG tablet TAKE 1 TABLET EVERY DAY (Patient taking differently: Take 80 mg by mouth daily.)   blood glucose meter kit and supplies Dispense based on patient and insurance preference. Use up to four times daily as directed. (FOR ICD-10 E10.9, E11.9).   carvedilol (COREG) 25 MG tablet TAKE 1 TABLET TWICE DAILY (Patient taking differently: Take 25 mg by mouth 2 (two) times daily with a meal.)    Ciclopirox 0.77 % gel APPLY ONE TIME DAILY ON THE AFFECTED TOE NAIL (Patient taking differently: Apply 1 application topically 2 (two) times daily as needed (affected toe nail). APPLY ONE TIME DAILY ON THE AFFECTED TOE NAIL.)   empagliflozin (JARDIANCE) 10 MG TABS tablet Take 1 tablet (10 mg total) by mouth daily before breakfast.   furosemide (LASIX) 20 MG tablet Take 1 tablet (20 mg total) by mouth every other day.   insulin aspart (NOVOLOG) 100 UNIT/ML injection Inject 6 Units into the skin 3 (three) times daily before meals. (Patient taking differently: Inject 5 Units into the skin 3 (three) times daily before meals.)   LANTUS 100 UNIT/ML injection INJECT  40 UNITS SUBCUTANEOUSLY TWICE DAILY (Patient taking differently: Inject 30 Units into the skin 2 (two) times daily.)   losartan (COZAAR) 25 MG tablet TAKE 1 TABLET EVERY DAY (Patient taking differently: Take 25 mg by mouth daily.)   Multiple Vitamin (MULTIVITAMIN) tablet Take 1 tablet by mouth daily.   omega-3 fish oil (MAXEPA) 1000 MG CAPS capsule Take 1 capsule by mouth daily.   polyethylene glycol (MIRALAX / GLYCOLAX) packet Take 17 g by mouth daily as needed for mild constipation.    spironolactone (ALDACTONE) 25 MG tablet TAKE 1 TABLET EVERY DAY (Patient taking differently: Take 25 mg by mouth daily.)   levothyroxine (SYNTHROID) 50 MCG tablet Take 1 tablet (50 mcg total) by mouth daily before breakfast. (Patient not taking: Reported on 09/07/2021)   traMADol (ULTRAM) 50 MG tablet Take 1 tablet (50 mg total) by  mouth every 6 (six) hours as needed. (Patient not taking: Reported on 09/07/2021)   No facility-administered encounter medications on file as of 09/07/2021.    Allergies (verified) Other and Lisinopril   History: Past Medical History:  Diagnosis Date   Anemia    Arthritis    CAD (coronary artery disease)    a. LHC (03/2014) [place]: nl, LAD: diff dz proximal 20%, 30-40% dz mid vessel prior 2nd diagonal, LCx: 40% in OM1, 20-30%  in OM2, RCA: 99% subtotal occlusion @ crux, TIMI 1 flow, L-R collaterals to distal vessel   Cardiomyopathy ([place]) 03/28/2014   LV dysfunction out of proportion to CAD 03/28/14   CHF (congestive heart failure) ([place])    Phreesia 56/86/1683   Chronic systolic heart failure ([place])    a. ECHO (03/2014): EF 25-30%, akinesis enteroanteroseptal myocardium, grade III DD b. RHC (03/2014) RA 4, RV 42/4, PA 44/14 (26), PCWP 21, PA 62% Fick CO/CI 6.9 / 3.4   Diabetes mellitus    Diabetes mellitus without complication ([place])    Phreesia 10/03/2020   Duodenal ulcer    Age 67   Dyspnea    Erosive esophagitis    Gastritis    Hypertension    Hypothyroidism    Obese    Short-term memory loss    Thyroid disease    TTP (thrombotic thrombocytopenic purpura) ([place]) 06/2005   Past Surgical History:  Procedure Laterality Date   BIV ICD INSERTION CRT-D N/A 05/03/2019   Procedure: BIV ICD INSERTION CRT-D;  Surgeon: Thompson Grayer, MD;  Location: [facility];  Service: Cardiovascular;  Laterality: N/A;   CARDIAC CATHETERIZATION     COLONOSCOPY  08/29/2012   FGB:[id number] diverticulosis   COLONOSCOPY N/A 05/28/2015   Procedure: COLONOSCOPY;  Surgeon: Daneil Dolin, MD;  Location[facility];  Service: Endoscopy;  Laterality: N/A;  1015   ECTOPIC PREGNANCY SURGERY     ESOPHAGOGASTRODUODENOSCOPY  04/20/2012   MCE:[id number] antral gastritis/Erosive reflux esophagitis   LEFT AND RIGHT HEART CATHETERIZATION WITH CORONARY ANGIOGRAM N/A 03/28/2014   Procedure: LEFT AND RIGHT HEART CATHETERIZATION WITH CORONARY ANGIOGRAM;  Surgeon: Peter M Martinique, MD;  Location: [facility];  Service: Cardiovascular;  Laterality: N/A;   SPLENECTOMY  07/20/2005   Family History  Problem Relation Age of Onset   Heart failure Mother    Cirrhosis Father        ETOH   Heart disease Brother    Diabetes Daughter    Breast cancer Maternal Aunt    Colon cancer Neg Hx    Social History   Socioeconomic History   Marital status:  Divorced    Spouse name: Not on file   Number of children: 3   Years of education: 12   Highest education level: Some college, no degree  Occupational History   Occupation: disabled    Fish farm manager: NOT EMPLOYED  Tobacco Use   Smoking status: Former    Packs/day: 0.25    Years: 10.00    Pack years: 2.50    Types: Cigarettes    Start date: 09/20/1966    Quit date: 09/20/1978    Years since quitting: 42.9   Smokeless tobacco: Never  Vaping Use   Vaping Use: Never used  Substance and Sexual Activity   Alcohol use: No    Alcohol/week: 0.0 standard drinks   Drug use: No   Sexual activity: Not Currently  Other Topics Concern   Not on file  Social History Narrative   Lives Alone  Social Determinants of Health   Financial Resource Strain: Low Risk    Difficulty of Paying Living Expenses: Not very hard  Food Insecurity: Food Insecurity Present   Worried About [place] in the Last Year: Sometimes true   Ran Out of Food in the Last Year: Sometimes true  Transportation Needs: No Transportation Needs   Lack of Transportation (Medical): No   Lack of Transportation (Non-Medical): No  Physical Activity: Insufficiently Active   Days of Exercise per Week: 2 days   Minutes of Exercise per Session: 20 min  Stress: No Stress Concern Present   Feeling of Stress : Not at all  Social Connections: Moderately Integrated   Frequency of Communication with Friends and Family: More than three times a week   Frequency of Social Gatherings with Friends and Family: Twice a week   Attends Religious Services: More than 4 times per year   Active Member of [employer] or Organizations: Yes   Attends Music therapist: More than 4 times per year   Marital Status: Divorced    Tobacco Counseling Counseling given: Not Answered   Clinical Intake:  Pre-visit preparation completed: No  Pain : No/denies pain     Nutritional Risks: Unintentional weight loss Diabetes: Yes CBG  done?: No Did pt. bring in CBG monitor from home?: No  How often do you need to have someone help you when you read instructions, pamphlets, or other written materials from your doctor or pharmacy?: 2 - Rarely What is the last grade level you completed in school?: 12th with one year of college  Diabetic? Yes Nutrition Risk Assessment:  Has the patient had any N/V/D within the last 2 months?  No  Does the patient have any non-healing wounds?  No  Has the patient had any unintentional weight loss or weight gain?  Yes   Diabetes:  Is the patient diabetic?  Yes  If diabetic, was a CBG obtained today?  No  Did the patient bring in their glucometer from home?  No  How often do you monitor your CBG's? Twice daily.   Financial Strains and Diabetes Management:  Are you having any financial strains with the device, your supplies or your medication? No .  Does the patient want to be seen by Chronic Care Management for management of their diabetes?  No  Would the patient like to be referred to a Nutritionist or for Diabetic Management?  No   Diabetic Exams:  Diabetic Eye Exam: Overdue for diabetic eye exam. Pt has been advised about the importance in completing this exam. Patient advised to call and schedule an eye exam. Diabetic Foot Exam: Overdue, Pt has been advised about the importance in completing this exam. Pt is scheduled for diabetic foot exam on next appt with Dr Posey Pronto.   Interpreter Needed?: No  Information entered by :: Judeen Hammans   Activities of Daily Living In your present state of health, do you have any difficulty performing the following activities: 07/15/2021  Hearing? N  Vision? N  Difficulty concentrating or making decisions? N  Walking or climbing stairs? Y  Dressing or bathing? N  Doing errands, shopping? N  Some recent data might be hidden    Patient Care Team: Lindell Spar, MD as PCP - General (Internal Medicine) Harl Bowie Alphonse Guild, MD as PCP - Cardiology  (Cardiology) Thompson Grayer, MD as PCP - Electrophysiology (Cardiology) Gala Romney Cristopher Estimable, MD as Attending Physician (Gastroenterology)  Indicate any recent Medical Services you [phone]  have received from other than [facility] providers in the past year (date may be approximate).     Assessment:   This is a routine wellness examination for Andreia.  Hearing/Vision screen No results found.  Dietary issues and exercise activities discussed:     Goals Addressed   None   Depression Screen PHQ 2/9 Scores 09/07/2021 05/08/2021 12/02/2020 10/06/2020 08/28/2020 08/28/2020 07/09/2020  PHQ - 2 Score 0 0 0 0 0 0 0  PHQ- 9 Score - - - - - - -  Exception Documentation - - - - - - -    Fall Risk Fall Risk  09/07/2021 05/08/2021 12/02/2020 10/06/2020 08/28/2020  Falls in the past year? 0 0 1 0 0  Number falls in past yr: 0 0 0 0 0  Injury with Fall? 0 0 0 0 0  Risk for fall due to : - No Fall Risks History of fall(s);Impaired balance/gait;Impaired mobility No Fall Risks No Fall Risks  Risk for fall due to: Comment - - - - -  Follow up Falls evaluation completed Falls evaluation completed Falls evaluation completed Falls evaluation completed Falls evaluation completed    [place]:  Any stairs in or around the home? Yes If so, are there any without handrails? Yes  Home free of loose throw rugs in walkways, pet beds, electrical cords, etc? No  Adequate lighting in your home to reduce risk of falls? Yes   ASSISTIVE DEVICES UTILIZED TO PREVENT FALLS:  Life alert? No  Use of a cane, walker or w/c? Yes  Grab bars in the bathroom? Yes  Shower chair or bench in shower? No Elevated toilet seat or a handicapped toilet? Yes    Cognitive Function: MMSE - Mini Mental State Exam 08/28/2020  Not completed: Unable to complete     6CIT Screen 09/07/2021 08/28/2020  What Year? 0 points 0 points  What month? 0 points 0 points  What time? 0 points 0 points  Count back from 20 0  points 0 points  Months in reverse 0 points 0 points  Repeat phrase 0 points 0 points  Total Score 0 0    Immunizations Immunization History  Administered Date(s) Administered   Fluad Quad(high Dose 65+) 06/19/2020   Influenza,inj,Quad PF,6+ Mos 06/22/2017   Influenza-Unspecified 06/20/2014, 07/21/2018   Moderna Sars-Covid-2 Vaccination 12/01/2019, 01/02/2020   Pneumococcal Polysaccharide-23 04/19/2012    TDAP status: Due, Education has been provided regarding the importance of this vaccine. Advised may receive this vaccine at local pharmacy or Health Dept. Aware to provide a copy of the vaccination record if obtained from local pharmacy or Health Dept. Verbalized acceptance and understanding.  Flu Vaccine status: Due, Education has been provided regarding the importance of this vaccine. Advised may receive this vaccine at local pharmacy or Health Dept. Aware to provide a copy of the vaccination record if obtained from local pharmacy or Health Dept. Verbalized acceptance and understanding.  Pneumococcal vaccine status: Due, Education has been provided regarding the importance of this vaccine. Advised may receive this vaccine at local pharmacy or Health Dept. Aware to provide a copy of the vaccination record if obtained from local pharmacy or Health Dept. Verbalized acceptance and understanding.  Covid-19 vaccine status: Information provided on how to obtain vaccines.   Qualifies for Shingles Vaccine? Yes   Zostavax completed No   Shingrix Completed?: No.    Education has been provided regarding the importance of this vaccine. Patient has been advised to call insurance  company to determine out of pocket expense if they have not yet received this vaccine. Advised may also receive vaccine at local pharmacy or Health Dept. Verbalized acceptance and understanding.  Screening Tests Health Maintenance  Topic Date Due   TETANUS/TDAP  Never done   Zoster Vaccines- Shingrix (1 of 2) Never done    Pneumonia Vaccine 43+ Years old (2 - PCV) 04/19/2013   COVID-19 Vaccine (3 - Booster for Moderna series) 02/27/2020   HEMOGLOBIN A1C  01/07/2021   FOOT EXAM  03/07/2021   INFLUENZA VACCINE  04/20/2021   OPHTHALMOLOGY EXAM  02/17/2022   MAMMOGRAM  03/14/2022   COLONOSCOPY (Pts 45-56yr Insurance coverage will need to be confirmed)  05/27/2025   DEXA SCAN  Completed   Hepatitis C Screening  Completed   HPV VACCINES  Aged Out    Health Maintenance  Health Maintenance Due  Topic Date Due   TETANUS/TDAP  Never done   Zoster Vaccines- Shingrix (1 of 2) Never done   Pneumonia Vaccine 72 Years old (2 - PCV) 04/19/2013   COVID-19 Vaccine (3 - Booster for Moderna series) 02/27/2020   HEMOGLOBIN A1C  01/07/2021   FOOT EXAM  03/07/2021   INFLUENZA VACCINE  04/20/2021    Colorectal cancer screening: Type of screening: Colonoscopy. Completed 0907/2016. Repeat every 10 years  Mammogram status: No longer required due to age.   Lung Cancer Screening: (Low Dose CT Chest recommended if Age 72-80years, 30 pack-year currently smoking OR have quit w/in 15years.) does qualify.   Lung Cancer Screening Referral: n'a  Additional Screening:  Hepatitis C Screening: does qualify; Completed 03/14/2020  Vision Screening: Recommended annual ophthalmology exams for early detection of glaucoma and other disorders of the eye. Is the patient up to date with their annual eye exam?  No  Who is the provider or what is the name of the office in which the patient attends annual eye exams? N/a If pt is not established with a provider, would they like to be referred to a provider to establish care? Yes .   Dental Screening: Recommended annual dental exams for proper oral hygiene  Community Resource Referral / Chronic Care Management: CRR required this visit?  Yes   CCM required this visit?  No      Plan:     I have personally reviewed and noted the following in the patients chart:   Medical and  social history Use of alcohol, tobacco or illicit drugs  Current medications and supplements including opioid prescriptions.  Functional ability and status Nutritional status Physical activity Advanced directives List of other physicians Hospitalizations, surgeries, and ER visits in previous 12 months Vitals Screenings to include cognitive, depression, and falls Referrals and appointments  In addition, I have reviewed and discussed with patient certain preventive protocols, quality metrics, and best practice recommendations. A written personalized care plan for preventive services as well as general preventive health recommendations were provided to patient.     SEarline Mayotte [place]  09/07/2021   Nurse Notes:  Ms. PHarts, Thank you for taking time to come for your Medicare Wellness Visit. I appreciate your ongoing commitment to your health goals. Please review the following plan we discussed and let me know if I can assist you in the future.   These are the goals we discussed:  Goals      Weight (lb) < 200 lb (90.7 kg)     Would like to lose 50 to 75lbs  This is a list of the screening recommended for you and due dates:  Health Maintenance  Topic Date Due   Tetanus Vaccine  Never done   Zoster (Shingles) Vaccine (1 of 2) Never done   Pneumonia Vaccine (2 - PCV) 04/19/2013   COVID-19 Vaccine (3 - Booster for Moderna series) 02/27/2020   Hemoglobin A1C  01/07/2021   Complete foot exam   03/07/2021   Flu Shot  04/20/2021   Eye exam for diabetics  02/17/2022   Mammogram  03/14/2022   Colon Cancer Screening  05/27/2025   DEXA scan (bone density measurement)  Completed   Hepatitis C Screening: USPSTF Recommendation to screen - Ages 11-79 yo.  Completed   HPV Vaccine  Aged Out

## 2021-09-08 NOTE — Progress Notes (Signed)
Electrophysiology Office Note Date: 09/09/2021  ID:  AZZIE THIEM, DOB Dec 25, 1950, MRN [id number]  PCP: Lindell Spar, MD Primary Cardiologist: [name], MD Electrophysiologist: Thompson Grayer, MD   CC: Routine ICD follow-up  [name] is a 70 y.o. female seen today for Thompson Grayer, MD for routine electrophysiology followup.  Barostim implant 07/22/2021.   At last visit Barostim device titrated from 3.0 ma to 5.0 ma in 0.4 ma increments without stim  Since last being seen in our clinic the patient reports about the same, has noticed less SOB. Able to do most ADLs without SOB. She has mild SOB mopping or vacuuming still. Overall feels breathing has much improved. Energy continues to improve, but not yet as much as she hopes to have.    her breathing has improved. She is able to move more freely around the house and do her ADLs without the SOB she was previously having. Denies dizziness or lightheadedness. Optivol has decreased since implant.   Goals for titration have been to be able to clean her apartment without any help. Currently can clean a room prior to needing to take a break. Can't mop floors or vacuum without SOB.   Device History: Barostim Optometrist) implant 07/22/2021 [employer] CRT-D implanted 04/2019 for chronic systolic CHF History of appropriate therapy: No History of AAD therapy: No  Past Medical History:  Diagnosis Date   Anemia    Arthritis    CAD (coronary artery disease)    a. LHC (03/2014) [place]: nl, LAD: diff dz proximal 20%, 30-40% dz mid vessel prior 2nd diagonal, LCx: 40% in OM1, 20-30% in OM2, RCA: 99% subtotal occlusion @ crux, TIMI 1 flow, L-R collaterals to distal vessel   Cardiomyopathy ([place]) 03/28/2014   LV dysfunction out of proportion to CAD 03/28/14   CHF (congestive heart failure) ([place])    Phreesia 03/54/6568   Chronic systolic heart failure ([place])    a. ECHO (03/2014): EF 25-30%, akinesis enteroanteroseptal myocardium,  grade III DD b. RHC (03/2014) RA 4, RV 42/4, PA 44/14 (26), PCWP 21, PA 62% Fick CO/CI 6.9 / 3.4   Diabetes mellitus    Diabetes mellitus without complication ([place])    Phreesia 10/03/2020   Duodenal ulcer    Age 12   Dyspnea    Erosive esophagitis    Gastritis    Hypertension    Hypothyroidism    Obese    Short-term memory loss    Thyroid disease    TTP (thrombotic thrombocytopenic purpura) ([place]) 06/2005   Past Surgical History:  Procedure Laterality Date   BIV ICD INSERTION CRT-D N/A 05/03/2019   Procedure: BIV ICD INSERTION CRT-D;  Surgeon: Thompson Grayer, MD;  Location: [facility];  Service: Cardiovascular;  Laterality: N/A;   CARDIAC CATHETERIZATION     COLONOSCOPY  08/29/2012   LEX:[id number] diverticulosis   COLONOSCOPY N/A 05/28/2015   Procedure: COLONOSCOPY;  Surgeon: Daneil Dolin, MD;  [facility];  Service: Endoscopy;  Laterality: N/A;  1015   ECTOPIC PREGNANCY SURGERY     ESOPHAGOGASTRODUODENOSCOPY  04/20/2012   CBS:WHQPRFF antral gastritis/Erosive reflux esophagitis   LEFT AND RIGHT HEART CATHETERIZATION WITH CORONARY ANGIOGRAM N/A 03/28/2014   Procedure: LEFT AND RIGHT HEART CATHETERIZATION WITH CORONARY ANGIOGRAM;  Surgeon: Peter M Martinique, MD;  Location: [facility];  Service: Cardiovascular;  Laterality: N/A;   SPLENECTOMY  07/20/2005    Current Outpatient Medications  Medication Sig Dispense Refill   acetaminophen (TYLENOL) 500 MG tablet  Take 1,000 mg by mouth every 6 (six) hours as needed for moderate pain.     aspirin 81 MG chewable tablet Chew 1 tablet (81 mg total) by mouth daily.     atorvastatin (LIPITOR) 80 MG tablet TAKE 1 TABLET EVERY DAY (Patient taking differently: Take 80 mg by mouth daily.) 90 tablet 1   blood glucose meter kit and supplies Dispense based on patient and insurance preference. Use up to four times daily as directed. (FOR ICD-10 E10.9, E11.9). 1 each 0   carvedilol (COREG) 25 MG tablet TAKE 1 TABLET TWICE DAILY  (Patient taking differently: Take 25 mg by mouth 2 (two) times daily with a meal.) 180 tablet 3   Ciclopirox 0.77 % gel APPLY ONE TIME DAILY ON THE AFFECTED TOE NAIL (Patient taking differently: Apply 1 application topically 2 (two) times daily as needed (affected toe nail). APPLY ONE TIME DAILY ON THE AFFECTED TOE NAIL.) 30 g 0   empagliflozin (JARDIANCE) 10 MG TABS tablet Take 1 tablet (10 mg total) by mouth daily before breakfast. 14 tablet 0   furosemide (LASIX) 20 MG tablet Take 1 tablet (20 mg total) by mouth every other day. 45 tablet 3   insulin aspart (NOVOLOG) 100 UNIT/ML injection Inject 6 Units into the skin 3 (three) times daily before meals. (Patient taking differently: Inject 5 Units into the skin 3 (three) times daily before meals.)     LANTUS 100 UNIT/ML injection INJECT  40 UNITS SUBCUTANEOUSLY TWICE DAILY (Patient taking differently: Inject 30 Units into the skin 2 (two) times daily.) 70 mL 1   levothyroxine (SYNTHROID) 50 MCG tablet Take 1 tablet (50 mcg total) by mouth daily before breakfast. (Patient not taking: Reported on 09/07/2021) 90 tablet 1   losartan (COZAAR) 25 MG tablet TAKE 1 TABLET EVERY DAY (Patient taking differently: Take 25 mg by mouth daily.) 90 tablet 2   Multiple Vitamin (MULTIVITAMIN) tablet Take 1 tablet by mouth daily.     omega-3 fish oil (MAXEPA) 1000 MG CAPS capsule Take 1 capsule by mouth daily.     polyethylene glycol (MIRALAX / GLYCOLAX) packet Take 17 g by mouth daily as needed for mild constipation.      spironolactone (ALDACTONE) 25 MG tablet TAKE 1 TABLET EVERY DAY (Patient taking differently: Take 25 mg by mouth daily.) 90 tablet 1   traMADol (ULTRAM) 50 MG tablet Take 1 tablet (50 mg total) by mouth every 6 (six) hours as needed. (Patient not taking: Reported on 09/07/2021) 20 tablet 0   No current facility-administered medications for this visit.    Allergies:   Other and Lisinopril   Social History: Social History   Socioeconomic History    Marital status: Divorced    Spouse name: Not on file   Number of children: 3   Years of education: 12   Highest education level: Some college, no degree  Occupational History   Occupation: disabled    Fish farm manager: NOT EMPLOYED  Tobacco Use   Smoking status: Former    Packs/day: 0.25    Years: 10.00    Pack years: 2.50    Types: Cigarettes    Start date: 09/20/1966    Quit date: 09/20/1978    Years since quitting: 43.0   Smokeless tobacco: Never  Vaping Use   Vaping Use: Never used  Substance and Sexual Activity   Alcohol use: No    Alcohol/week: 0.0 standard drinks   Drug use: No   Sexual activity: Not Currently  Other Topics Concern  Not on file  Social History Narrative   Lives Alone       Social Determinants of Health   Financial Resource Strain: Low Risk    Difficulty of Paying Living Expenses: Not very hard  Food Insecurity: Food Insecurity Present   Worried About Running Out of Food in the Last Year: Sometimes true   Ran Out of Food in the Last Year: Sometimes true  Transportation Needs: No Transportation Needs   Lack of Transportation (Medical): No   Lack of Transportation (Non-Medical): No  Physical Activity: Insufficiently Active   Days of Exercise per Week: 2 days   Minutes of Exercise per Session: 20 min  Stress: No Stress Concern Present   Feeling of Stress : Not at all  Social Connections: Moderately Integrated   Frequency of Communication with Friends and Family: More than three times a week   Frequency of Social Gatherings with Friends and Family: Twice a week   Attends Religious Services: More than 4 times per year   Active Member of [employer] or Organizations: Yes   Attends Music therapist: More than 4 times per year   Marital Status: Divorced  Human resources officer Violence: Not At Risk   Fear of Current or Ex-Partner: No   Emotionally Abused: No   Physically Abused: No   Sexually Abused: No    Family History: Family History  Problem  Relation Age of Onset   Heart failure Mother    Cirrhosis Father        ETOH   Heart disease Brother    Diabetes Daughter    Breast cancer Maternal Aunt    Colon cancer Neg Hx     Review of Systems: Review of systems complete and found to be negative unless listed in HPI.     Physical Exam: Vitals:   09/09/21 1204  BP: 106/71  Pulse: 84     General:  Well appearing. No resp difficulty. HEENT: Normal Neck: Supple. JVP 5-6. Carotids 2+ bilat; no bruits. No thyromegaly or nodule noted. Cor: PMI nondisplaced. RRR, No M/G/R noted Lungs: CTAB, normal effort. Abdomen: Soft, non-tender, non-distended, no HSM. No bruits or masses. +BS   Extremities: No cyanosis, clubbing, or rash. R and LLE no edema.  Neuro: Alert & orientedx3, cranial nerves grossly intact. moves all 4 extremities w/o difficulty. Affect pleasant   Barostim interrogation- Performed personally. See attached report.    EKG:  EKG is not ordered today  Recent Labs: 05/13/2021: NT-Pro BNP 476 07/15/2021: ALT 39; Hemoglobin 11.0; Platelets 150 08/26/2021: BUN 26; Creatinine, Ser 1.42; Potassium 5.0; Sodium 138   Wt Readings from Last 3 Encounters:  08/26/21 228 lb (103.4 kg)  08/11/21 229 lb 12.8 oz (104.2 kg)  08/03/21 231 lb (104.8 kg)     Other studies Reviewed: Additional studies/ records that were reviewed today include: Previous EP office notes.   Assessment and Plan:  1.  Chronic systolic dysfunction s/p [employer] CRT-D  Volume status stable on exam. Stable on an appropriate medical regimen Normal ICD function last check No changes today Barostim device titrated from 5.0 ma at 125 ms to 8.0 ma to 65 ms (equivalent to ~ 6.0 ma @125 )  She reported intermittent stim as a buzzing at 5.0 ma @ 125 ms, so was decreased to 65 ms.    Had recurrence of stim sensation at 8.8 ma @ 65 ms and 8.4 RTC 2-3 weeks for further titration  Watch fluid status on jardiance, lasix, and spiro. Repeat  BMET BNP today  2.  HTN Stable on current regimen   3. CAD Denies s/s ischemia  Current medicines are reviewed at length with the patient today.    Disposition:   Will continue titration with 2 week visit.    [name], PA-C  09/09/2021 12:04 PM  [facility] [address] [place] [address] [phone] (office) [phone] (fax)

## 2021-09-09 ENCOUNTER — Encounter: Payer: Self-pay | Admitting: Student

## 2021-09-09 ENCOUNTER — Ambulatory Visit (INDEPENDENT_AMBULATORY_CARE_PROVIDER_SITE_OTHER): Payer: Medicare HMO | Admitting: Student

## 2021-09-09 ENCOUNTER — Other Ambulatory Visit: Payer: Self-pay

## 2021-09-09 VITALS — BP 106/71 | HR 84

## 2021-09-09 DIAGNOSIS — I5022 Chronic systolic (congestive) heart failure: Secondary | ICD-10-CM

## 2021-09-09 DIAGNOSIS — I2510 Atherosclerotic heart disease of native coronary artery without angina pectoris: Secondary | ICD-10-CM | POA: Diagnosis not present

## 2021-09-09 DIAGNOSIS — I10 Essential (primary) hypertension: Secondary | ICD-10-CM

## 2021-09-09 NOTE — Patient Instructions (Signed)
Medication Instructions:  Your physician recommends that you continue on your current medications as directed. Please refer to the Current Medication list given to you today.  *If you need a refill on your cardiac medications before your next appointment, please call your pharmacy*   Lab Work: TODAY: BMET  If you have labs (blood work) drawn today and your tests are completely normal, you will receive your results only by: MyChart Message (if you have MyChart) OR A paper copy in the mail If you have any lab test that is abnormal or we need to change your treatment, we will call you to review the results.    Follow-Up: At [facility], you and your health needs are our priority.  As part of our continuing mission to provide you with exceptional heart care, we have created designated Provider Care Teams.  These Care Teams include your primary Cardiologist (physician) and Advanced Practice Providers (APPs -  Physician Assistants and Nurse Practitioners) who all work together to provide you with the care you need, when you need it.  We recommend signing up for the patient portal called "MyChart".  Sign up information is provided on this After Visit Summary.  MyChart is used to connect with patients for Virtual Visits (Telemedicine).  Patients are able to view lab/test results, encounter notes, upcoming appointments, etc.  Non-urgent messages can be sent to your provider as well.   To learn more about what you can do with MyChart, go to [email or web address].    Your next appointment:   As scheduled  

## 2021-09-10 LAB — BASIC METABOLIC PANEL
BUN/Creatinine Ratio: 17 (ref 12–28)
BUN: 17 mg/dL (ref 8–27)
CO2: 24 mmol/L (ref 20–29)
Calcium: 9.5 mg/dL (ref 8.7–10.3)
Chloride: 102 mmol/L (ref 96–106)
Creatinine, Ser: 1.01 mg/dL — ABNORMAL HIGH (ref 0.57–1.00)
Glucose: 74 mg/dL (ref 70–99)
Potassium: 4.6 mmol/L (ref 3.5–5.2)
Sodium: 139 mmol/L (ref 134–144)
eGFR: 59 mL/min/{1.73_m2} — ABNORMAL LOW (ref >59)

## 2021-09-10 LAB — PRO B NATRIURETIC PEPTIDE: NT-Pro BNP: 490 pg/mL — ABNORMAL HIGH (ref 0–301)

## 2021-09-17 ENCOUNTER — Ambulatory Visit: Payer: Medicare HMO | Admitting: Cardiology

## 2021-09-17 ENCOUNTER — Encounter: Payer: Self-pay | Admitting: Cardiology

## 2021-09-17 NOTE — Progress Notes (Deleted)
Clinical Summary [name] is a 70 y.o.female seen today for follow up of the following meidcal problems.    1. Chronic combined systolic and diastolic heart failure - echo 03/2014 LVEF 25-30%, restrictive diastolic dysfunction. New diagnosis at that time. Repeat echo 06/2014 LVEF 40-45% - cath 03/2014 showed RCA 99% with left to right collaterals, other arteries patent. Overall LV systolic dysfunction out of proportion to CAD.  Medically managed.  - 07/2015 echo LVEF 07%, grade I diastolic dysfunction   - Entresto causes dizziness and nausea, stopped taking. Back on losartan   -  due to low bp's we lowered losartan to 59m daily. Lowered lasix to 212mdaily, may take 4062ms needed.    01/2018 echo LVEF 35%, grade I diastoilc dysfunction  - 11/2018 echo LVEF 30-35% - 05/03/19 had BIV AICD placed by Dr AllRayann Hemanarostim followed by EP   11/2019 LVEF 30-35%      - some recent swelling in legs a few weeks ago that has resolved. Home weight stable around 242-246 lbs - compliant with meds    - ongoing SOB at times, overall stable - some LE edema at times. Takes lasix 2m1mily. - home weights usually 235 lbs.      2. CAD - cath 03/2014  with 99% chronic RCA disease, otherwise patent vessels. Managed medically   - no recent chest pains.    3. OSA  - on bipap at night. Followed by Dr TurnRadford Pax 4. Hyperlipidemia -upcoming labs next month 02/2020 TC 76 HDL 33 TG 75 LDL 27 - labs are pending with pcp   5. Chronic LBBB     6. Leg pains - back of thighs and calves with walking, resolves with rest     Past Medical History:  Diagnosis Date   Anemia    Arthritis    CAD (coronary artery disease)    a. LHC (03/2014) [place]: nl, LAD: diff dz proximal 20%, 30-40% dz mid vessel prior 2nd diagonal, LCx: 40% in OM1, 20-30% in OM2, RCA: 99% subtotal occlusion @ crux, TIMI 1 flow, L-R collaterals to distal vessel   Cardiomyopathy (HCC)[place]/05/2014   LV dysfunction out of proportion to  CAD 03/28/14   CHF (congestive heart failure) (HCC)[place] [employer] 01/186/75/4492hronic systolic heart failure (HCC)[place] a. ECHO (03/2014): EF 25-30%, akinesis enteroanteroseptal myocardium, grade III DD b. RHC (03/2014) RA 4, RV 42/4, PA 44/14 (26), PCWP 21, PA 62% Fick CO/CI 6.9 / 3.4   Diabetes mellitus    Diabetes mellitus without complication (HCC)[place] [employer] 10/03/2020   Duodenal ulcer    Age 34   Dyspnea    Erosive esophagitis    Gastritis    Hypertension    Hypothyroidism    Obese    Short-term memory loss    Thyroid disease    TTP (thrombotic thrombocytopenic purpura) (HCC)[place]/2006     Allergies  Allergen Reactions   Other Other (See Comments)    FLAGYL, CIPRO, PROTONIX taken at the same time caused patient to lose her breath and have trouble breathing, [facility] hospital [facility]-per patient.    Lisinopril Cough     Current Outpatient Medications  Medication Sig Dispense Refill   acetaminophen (TYLENOL) 500 MG tablet Take 1,000 mg by mouth every 6 (six) hours as needed for moderate pain.     aspirin 81 MG chewable tablet Chew 1 tablet (81 mg  total) by mouth daily.     atorvastatin (LIPITOR) 80 MG tablet TAKE 1 TABLET EVERY DAY (Patient taking differently: Take 80 mg by mouth daily.) 90 tablet 1   blood glucose meter kit and supplies Dispense based on patient and insurance preference. Use up to four times daily as directed. (FOR ICD-10 E10.9, E11.9). 1 each 0   carvedilol (COREG) 25 MG tablet TAKE 1 TABLET TWICE DAILY (Patient taking differently: Take 25 mg by mouth 2 (two) times daily with a meal.) 180 tablet 3   Ciclopirox 0.77 % gel APPLY ONE TIME DAILY ON THE AFFECTED TOE NAIL (Patient taking differently: Apply 1 application topically 2 (two) times daily as needed (affected toe nail). APPLY ONE TIME DAILY ON THE AFFECTED TOE NAIL.) 30 g 0   empagliflozin (JARDIANCE) 10 MG TABS tablet Take 1 tablet (10 mg total) by mouth daily before breakfast. 14 tablet 0    furosemide (LASIX) 20 MG tablet Take 1 tablet (20 mg total) by mouth every other day. 45 tablet 3   insulin aspart (NOVOLOG) 100 UNIT/ML injection Inject 6 Units into the skin 3 (three) times daily before meals. (Patient taking differently: Inject 5 Units into the skin 3 (three) times daily before meals.)     LANTUS 100 UNIT/ML injection INJECT  40 UNITS SUBCUTANEOUSLY TWICE DAILY (Patient taking differently: Inject 30 Units into the skin 2 (two) times daily.) 70 mL 1   levothyroxine (SYNTHROID) 50 MCG tablet Take 1 tablet (50 mcg total) by mouth daily before breakfast. (Patient not taking: Reported on 09/07/2021) 90 tablet 1   losartan (COZAAR) 25 MG tablet TAKE 1 TABLET EVERY DAY (Patient taking differently: Take 25 mg by mouth daily.) 90 tablet 2   Multiple Vitamin (MULTIVITAMIN) tablet Take 1 tablet by mouth daily.     omega-3 fish oil (MAXEPA) 1000 MG CAPS capsule Take 1 capsule by mouth daily.     polyethylene glycol (MIRALAX / GLYCOLAX) packet Take 17 g by mouth daily as needed for mild constipation.      spironolactone (ALDACTONE) 25 MG tablet TAKE 1 TABLET EVERY DAY (Patient taking differently: Take 25 mg by mouth daily.) 90 tablet 1   traMADol (ULTRAM) 50 MG tablet Take 1 tablet (50 mg total) by mouth every 6 (six) hours as needed. (Patient not taking: Reported on 09/07/2021) 20 tablet 0   No current facility-administered medications for this visit.     Past Surgical History:  Procedure Laterality Date   BIV ICD INSERTION CRT-D N/A 05/03/2019   Procedure: BIV ICD INSERTION CRT-D;  Surgeon: Thompson Grayer, MD;  Location: [facility];  Service: Cardiovascular;  Laterality: N/A;   CARDIAC CATHETERIZATION     COLONOSCOPY  08/29/2012   HEN:IDPOEUM diverticulosis   COLONOSCOPY N/A 05/28/2015   Procedure: COLONOSCOPY;  Surgeon: Daneil Dolin, MD;  Location: [facility];  Service: Endoscopy;  Laterality: N/A;  1015   ECTOPIC PREGNANCY SURGERY     ESOPHAGOGASTRODUODENOSCOPY   04/20/2012   PNT:IRWERXV antral gastritis/Erosive reflux esophagitis   LEFT AND RIGHT HEART CATHETERIZATION WITH CORONARY ANGIOGRAM N/A 03/28/2014   Procedure: LEFT AND RIGHT HEART CATHETERIZATION WITH CORONARY ANGIOGRAM;  Surgeon: Peter M Martinique, MD;  Location: [facility] C[facility] LAB;  Service: Cardiovascular;  Laterality: N/A;   SPLENECTOMY  07/20/2005     Allergies  Allergen Reactions   Other Other (See Comments)    FLAGYL, CIPRO, PROTONIX taken at the same time caused patient to lose her breath and have trouble breathing, requiring hospital stay  at [facility], 03/23/14-per patient.    Lisinopril Cough      Family History  Problem Relation Age of Onset   Heart failure Mother    Cirrhosis Father        ETOH   Heart disease Brother    Diabetes Daughter    Breast cancer Maternal Aunt    Colon cancer Neg Hx      Social History Ms. Fryman reports that she quit smoking about 43 years ago. Her smoking use included cigarettes. She started smoking about 55 years ago. She has a 2.50 pack-year smoking history. She has never used smokeless tobacco. Ms. Lafoy reports no history of alcohol use.   Review of Systems CONSTITUTIONAL: No weight loss, fever, chills, weakness or fatigue.  HEENT: Eyes: No visual loss, blurred vision, double vision or yellow sclerae.No hearing loss, sneezing, congestion, runny nose or sore throat.  SKIN: No rash or itching.  CARDIOVASCULAR:  RESPIRATORY: No shortness of breath, cough or sputum.  GASTROINTESTINAL: No anorexia, nausea, vomiting or diarrhea. No abdominal pain or blood.  GENITOURINARY: No burning on urination, no polyuria NEUROLOGICAL: No headache, dizziness, syncope, paralysis, ataxia, numbness or tingling in the extremities. No change in bowel or bladder control.  MUSCULOSKELETAL: No muscle, back pain, joint pain or stiffness.  LYMPHATICS: No enlarged nodes. No history of splenectomy.  PSYCHIATRIC: No history of depression or anxiety.  ENDOCRINOLOGIC: No reports  of sweating, cold or heat intolerance. No polyuria or polydipsia.  Marland Kitchen   Physical Examination There were no vitals filed for this visit. There were no vitals filed for this visit.  Gen: resting comfortably, no acute distress HEENT: no scleral icterus, pupils equal round and reactive, no palptable cervical adenopathy,  CV Resp: Clear to auscultation bilaterally GI: abdomen is soft, non-tender, non-distended, normal bowel sounds, no hepatosplenomegaly MSK: extremities are warm, no edema.  Skin: warm, no rash Neuro:  no focal deficits Psych: appropriate affect   Diagnostic Studies 03/2014 echo Study Conclusions  - Left ventricle: The cavity size was normal. There was mild focal basal hypertrophy of the septum. Systolic function was severely reduced. The estimated ejection fraction was in the range of 25% to 30%. There is akinesis of the entireanteroseptal myocardium. Doppler parameters are consistent with a reversible restrictive pattern, indicative of decreased left ventricular diastolic compliance and/or increased left atrial pressure (grade 3 diastolic dysfunction). - Mitral valve: There was mild regurgitation. - Left atrium: The atrium was moderately dilated.  03/2014 Cath Procedural Findings:   Hemodynamics   RA 8/0 mean 4 mm Hg   RV 42/4 mm Hg   PA 44/14 mean 26 mm Hg   PCWP 20/28 mean 21 mm Hg   LV 98/18 mm Hg   AO 95/54 mean 72 mm Hg   Oxygen saturations:   PA 62%   AO 90%   Cardiac Output (Fick) 6.9 L/min   Cardiac Index (Fick) 3.4 L/min/meter squared   Coronary angiography:   Coronary dominance: right   Left mainstem: Normal   Left anterior descending (LAD): Diffuse disease in the proximal vessel up to 20%. 30-40% disease in the mid vessel prior to the second diagonal.   Left circumflex (LCx): 40% in OM1. 20-30% in OM2. Otherwise no significant disease.   Right coronary artery (RCA): 99% subtotal occlusion at the crux. TIMI 1 flow. Left to right collaterals to  the distal vessel.   Left ventriculography: Left ventricular systolic function is abnormal, LVEF is estimated at 25%. There is inferior akinesis and severe global  hypokinesis. There is mild mitral regurgitation   Final Conclusions:   1. Single vessel obstructive CAD with subtotal occlusion of the RCA at the crux.   2. Severe LV dysfunction.   3. Normal Right heart pressures. Elevated PCWP.   Recommendations: Medical management. Her degree of LV dysfunction is out of proportion to her CAD. She has no anginal symptoms so PCI of RCA not indicated. The inferior wall appears scarred.   07/2015 echo Study Conclusions   - Left ventricle: The cavity size was at the upper limits of   normal. Wall thickness was increased in a pattern of moderate   LVH. The estimated ejection fraction was 40% (similar calculation   per biplane speckle tracking, reduced global longitudinal strain   of -14.3%). There is akinesis of the basalinferolateral and   inferior myocardium. Doppler parameters are consistent with   abnormal left ventricular relaxation (grade 1 diastolic   dysfunction). - Aortic valve: Mildly calcified annulus. Trileaflet. - Mitral valve: Calcified annulus. There was trivial regurgitation. - Left atrium: The atrium was at the upper limits of normal in   size. - Right atrium: Central venous pressure (est): 3 mm Hg. - Atrial septum: A patent foramen ovale cannot be excluded. - Tricuspid valve: There was trivial regurgitation. - Pulmonary arteries: Systolic pressure could not be accurately   estimated. - Pericardium, extracardiac: There was no pericardial effusion.   Impressions:   - Upper normal LV chamber size with moderate LVH and LVEF   approximately 40% as discussed above. There is akinesis of the   basal inferoseptal and inferior myocardium. Grade 1 diastolic   dysfunction. Compared to the previous study from October 2015,   LVEF is in similar range, perhaps slightly reduced. Upper  normal   left atrial chamber size. Trivial tricuspid regurgitation. Cannot   exclude PFO based on limited images.     01/2018 echo Study Conclusions   - Left ventricle: The cavity size was normal. Wall thickness was   increased in a pattern of mild LVH. The estimated ejection   fraction was 35%. There is akinesis of the basal-midinferior   myocardium. There is akinesis of the mid-apicalanteroseptal and   apical myocardium. Doppler parameters are consistent with   abnormal left ventricular relaxation (grade 1 diastolic   dysfunction). - Ventricular septum: Septal motion showed abnormal function and   dyssynergy suggestive of left bundle Yeilyn Gent block. - Aortic valve: Moderately calcified annulus. Trileaflet. - Mitral valve: There was mild regurgitation. - Right atrium: Central venous pressure (est): 3 mm Hg. - Atrial septum: No defect or patent foramen ovale was identified. - Tricuspid valve: There was trivial regurgitation. - Pulmonary arteries: Systolic pressure could not be accurately   estimated. - Pericardium, extracardiac: There was no pericardial effusion     11/2018 echo 1. The left ventricle has moderate-severely reduced systolic function, with an ejection fraction of 30-35%. The cavity size was normal. There is mildly increased left ventricular wall thickness. Left ventricular diastolic Doppler parameters are  consistent with impaired relaxation. Elevated mean left atrial pressure.  2. The anteroseptal, anterolateral, apical walls are hypokinetic.  3. The right ventricle has normal systolic function. The cavity was normal. There is no increase in right ventricular wall thickness.  4. Left atrial size was moderately dilated.  5. No evidence of mitral valve stenosis.  6. The aortic valve is tricuspid no stenosis of the aortic valve.  7. Pulmonary hypertension is indeterminant, inadequate TR jet.  8. The inferior vena cava was dilated  in size with <50% respiratory  variability.  9. The interatrial septum was not well visualized.      Assessment and Plan  1. Chronic combined systolic/diastolic HF - medical therapy limited by soft bp's. Did not tolerate entresto. She is on her maximally tolerated regimen - s/p BIV AICD  - chronic stable DOE, appears euvolemic today. Continue current meds. She is considering BAT therapy   2. CAD - medically managed single vessel RCA disease - no recent symptoms, continue current meds   3. Hyperlipidemia - requdst pcp labs continue atorvastatin   4. OSA - had been followed by Dr Radford Pax, difficult for patient to get to [facility], looking for local option - we will refer to [facility] to establish care for her OSA   5. Leg pains - symptoms suggestive of claudication, will obtain ABIs        Arnoldo Lenis, M.D., F.A.C.C.

## 2021-09-23 ENCOUNTER — Ambulatory Visit: Payer: Medicare HMO | Admitting: Vascular Surgery

## 2021-09-25 ENCOUNTER — Other Ambulatory Visit: Payer: Self-pay

## 2021-09-25 ENCOUNTER — Encounter: Payer: Self-pay | Admitting: Student

## 2021-09-25 ENCOUNTER — Ambulatory Visit (INDEPENDENT_AMBULATORY_CARE_PROVIDER_SITE_OTHER): Payer: Medicare HMO | Admitting: Student

## 2021-09-25 VITALS — BP 122/60 | HR 83 | Ht 65.0 in | Wt 229.8 lb

## 2021-09-25 DIAGNOSIS — I5022 Chronic systolic (congestive) heart failure: Secondary | ICD-10-CM | POA: Diagnosis not present

## 2021-09-25 DIAGNOSIS — I2510 Atherosclerotic heart disease of native coronary artery without angina pectoris: Secondary | ICD-10-CM

## 2021-09-25 DIAGNOSIS — I10 Essential (primary) hypertension: Secondary | ICD-10-CM | POA: Diagnosis not present

## 2021-09-25 NOTE — Progress Notes (Signed)
Electrophysiology Office Note Date: 09/25/2021  ID:  [name], DOB [birth year], MRN [id number]  PCP: Lindell Spar, MD Primary Cardiologist: [name], MD Electrophysiologist: Thompson Grayer, MD   CC: Routine ICD follow-up  [name] is a 71 y.o. female seen today for Thompson Grayer, MD for routine electrophysiology followup.  Barostim implant 07/22/2021.   At last visit Barostim device titrated from 5.0 ma at 125 ms to 8.0 ma to 65 ms (equivalent to ~ 6.0 ma _0 )  She reported intermittent stim as a buzzing at 5.0 ma @ 125 ms, so was decreased to 65 ms.    Had recurrence of stim sensation at 8.8 ma @ 65 ms and 8.4  Since last being seen in our clinic the patient reports doing well. She has had significant improvement of her functional status by [facility] (See attached). Her goals are to continue to improve in her function status to visit with family and to do household chores.   Device History: Barostim Optometrist) implant 07/22/2021 [employer] CRT-D implanted 04/2019 for chronic systolic CHF History of appropriate therapy: No History of AAD therapy: No  Past Medical History:  Diagnosis Date   Anemia    Arthritis    CAD (coronary artery disease)    a. LHC (03/2014) Lmain: nl, LAD: diff dz proximal 20%, 30-40% dz mid vessel prior 2nd diagonal, LCx: 40% in OM1, 20-30% in OM2, RCA: 99% subtotal occlusion @ crux, TIMI 1 flow, L-R collaterals to distal vessel   Cardiomyopathy ([place]) 03/28/2014   LV dysfunction out of proportion to CAD 03/28/14   CHF (congestive heart failure) ([place])    Phreesia 74/94/4967   Chronic systolic heart failure ([place])    a. ECHO (03/2014): EF 25-30%, akinesis enteroanteroseptal myocardium, grade III DD b. RHC (03/2014) RA 4, RV 42/4, PA 44/14 (26), PCWP 21, PA 62% Fick CO/CI 6.9 / 3.4   Diabetes mellitus    Diabetes mellitus without complication ([place])    Phreesia 10/03/2020   Duodenal ulcer    Age 37   Dyspnea    Erosive esophagitis     Gastritis    Hypertension    Hypothyroidism    Obese    Short-term memory loss    Thyroid disease    TTP (thrombotic thrombocytopenic purpura) ([place]) 06/2005   Past Surgical History:  Procedure Laterality Date   BIV ICD INSERTION CRT-D N/A 05/03/2019   Procedure: BIV ICD INSERTION CRT-D;  Surgeon: Thompson Grayer, MD;  Location: [place] [facility];  Service: Cardiovascular;  Laterality: N/A;   CARDIAC CATHETERIZATION     COLONOSCOPY  08/29/2012   RFF:[id number] diverticulosis   COLONOSCOPY N/A 05/28/2015   Procedure: COLONOSCOPY;  Surgeon: Daneil Dolin, MD;  [facility];  Service: Endoscopy;  Laterality: N/A;  1015   ECTOPIC PREGNANCY SURGERY     ESOPHAGOGASTRODUODENOSCOPY  04/20/2012   LDJ:TTSVXBL antral gastritis/Erosive reflux esophagitis   LEFT AND RIGHT HEART CATHETERIZATION WITH CORONARY ANGIOGRAM N/A 03/28/2014   Procedure: LEFT AND RIGHT HEART CATHETERIZATION WITH CORONARY ANGIOGRAM;  Surgeon: Peter M Martinique, MD;  Location: [facility] CATH LAB;  Service: Cardiovascular;  Laterality: N/A;   SPLENECTOMY  07/20/2005    Current Outpatient Medications  Medication Sig Dispense Refill   acetaminophen (TYLENOL) 500 MG tablet Take 1,000 mg by mouth every 6 (six) hours as needed for moderate pain.     aspirin 81 MG chewable tablet Chew 1 tablet (81 mg total) by mouth daily.     atorvastatin (LIPITOR)  80 MG tablet TAKE 1 TABLET EVERY DAY (Patient taking differently: Take 80 mg by mouth daily.) 90 tablet 1   blood glucose meter kit and supplies Dispense based on patient and insurance preference. Use up to four times daily as directed. (FOR ICD-10 E10.9, E11.9). 1 each 0   carvedilol (COREG) 25 MG tablet TAKE 1 TABLET TWICE DAILY (Patient taking differently: Take 25 mg by mouth 2 (two) times daily with a meal.) 180 tablet 3   Ciclopirox 0.77 % gel APPLY ONE TIME DAILY ON THE AFFECTED TOE NAIL (Patient taking differently: Apply 1 application topically 2 (two) times daily as needed (affected  toe nail). APPLY ONE TIME DAILY ON THE AFFECTED TOE NAIL.) 30 g 0   empagliflozin (JARDIANCE) 10 MG TABS tablet Take 1 tablet (10 mg total) by mouth daily before breakfast. 14 tablet 0   furosemide (LASIX) 20 MG tablet Take 1 tablet (20 mg total) by mouth every other day. 45 tablet 3   insulin aspart (NOVOLOG) 100 UNIT/ML injection Inject 6 Units into the skin 3 (three) times daily before meals. (Patient taking differently: Inject 5 Units into the skin 3 (three) times daily before meals.)     LANTUS 100 UNIT/ML injection INJECT  40 UNITS SUBCUTANEOUSLY TWICE DAILY (Patient taking differently: Inject 30 Units into the skin 2 (two) times daily.) 70 mL 1   levothyroxine (SYNTHROID) 50 MCG tablet Take 1 tablet (50 mcg total) by mouth daily before breakfast. (Patient not taking: Reported on 09/07/2021) 90 tablet 1   losartan (COZAAR) 25 MG tablet TAKE 1 TABLET EVERY DAY (Patient taking differently: Take 25 mg by mouth daily.) 90 tablet 2   Multiple Vitamin (MULTIVITAMIN) tablet Take 1 tablet by mouth daily.     omega-3 fish oil (MAXEPA) 1000 MG CAPS capsule Take 1 capsule by mouth daily.     polyethylene glycol (MIRALAX / GLYCOLAX) packet Take 17 g by mouth daily as needed for mild constipation.      spironolactone (ALDACTONE) 25 MG tablet TAKE 1 TABLET EVERY DAY (Patient taking differently: Take 25 mg by mouth daily.) 90 tablet 1   traMADol (ULTRAM) 50 MG tablet Take 1 tablet (50 mg total) by mouth every 6 (six) hours as needed. (Patient not taking: Reported on 09/07/2021) 20 tablet 0   No current facility-administered medications for this visit.    Allergies:   Other and Lisinopril   Social History: Social History   Socioeconomic History   Marital status: Divorced    Spouse name: Not on file   Number of children: 3   Years of education: 12   Highest education level: Some college, no degree  Occupational History   Occupation: disabled    Fish farm manager: NOT EMPLOYED  Tobacco Use   Smoking  status: Former    Packs/day: 0.25    Years: 10.00    Pack years: 2.50    Types: Cigarettes    Start date: 09/20/1966    Quit date: 09/20/1978    Years since quitting: 43.0   Smokeless tobacco: Never  Vaping Use   Vaping Use: Never used  Substance and Sexual Activity   Alcohol use: No    Alcohol/week: 0.0 standard drinks   Drug use: No   Sexual activity: Not Currently  Other Topics Concern   Not on file  Social History Narrative   Lives Alone       Social Determinants of Health   Financial Resource Strain: Low Risk    Difficulty of Paying Living Expenses:  Not very hard  Food Insecurity: Food Insecurity Present   Worried About Charity fundraiser in the Last Year: Sometimes true   Ran Out of Food in the Last Year: Sometimes true  Transportation Needs: No Transportation Needs   Lack of Transportation (Medical): No   Lack of Transportation (Non-Medical): No  Physical Activity: Insufficiently Active   Days of Exercise per Week: 2 days   Minutes of Exercise per Session: 20 min  Stress: No Stress Concern Present   Feeling of Stress : Not at all  Social Connections: Moderately Integrated   Frequency of Communication with Friends and Family: More than three times a week   Frequency of Social Gatherings with Friends and Family: Twice a week   Attends Religious Services: More than 4 times per year   Active Member of [employer] or Organizations: Yes   Attends Music therapist: More than 4 times per year   Marital Status: Divorced  Human resources officer Violence: Not At Risk   Fear of Current or Ex-Partner: No   Emotionally Abused: No   Physically Abused: No   Sexually Abused: No    Family History: Family History  Problem Relation Age of Onset   Heart failure Mother    Cirrhosis Father        ETOH   Heart disease Brother    Diabetes Daughter    Breast cancer Maternal Aunt    Colon cancer Neg Hx     Review of Systems: Review of systems complete and found to be  negative unless listed in HPI.     Physical Exam: Vitals:   09/25/21 1139  Weight: 229 lb 12.8 oz (104.2 kg)  Height: _0  (1.651 m)   General: Pleasant, NAD. No resp difficulty Psych: Normal affect. HEENT:  Normal, without mass or lesion.         Neck: Supple, no bruits or JVD. Carotids 2+. No lymphadenopathy/thyromegaly appreciated. Heart: PMI nondisplaced. RRR no s3, s4, or murmurs. Lungs:  Resp regular and unlabored, CTA. Abdomen: Soft, non-tender, non-distended, No HSM, BS + x 4.   Extremities: No clubbing, cyanosis or edema. DP/PT/Radials 2+ and equal bilaterally. Neuro: Alert and oriented X 3. Moves all extremities spontaneously.   Barostim interrogation- Performed personally, see attached report.   EKG:  EKG is not ordered today  Recent Labs: 07/15/2021: ALT 39; Hemoglobin 11.0; Platelets 150 09/09/2021: BUN 17; Creatinine, Ser 1.01; NT-Pro BNP 490; Potassium 4.6; Sodium 139   Wt Readings from Last 3 Encounters:  09/25/21 229 lb 12.8 oz (104.2 kg)  08/26/21 228 lb (103.4 kg)  08/11/21 229 lb 12.8 oz (104.2 kg)     Other studies Reviewed: Additional studies/ records that were reviewed today include: Previous EP office notes.   Assessment and Plan:  1.  Chronic systolic dysfunction s/p [employer]. Jude CRT-D  Volume status stable on exam. Stable on an appropriate medical regimen Normal ICD function last check No changes today Barostim device titrated from 8.0 ma at 65 ms to 10.0 ma to 45 ms. She had consistent stim after leaving office last time at previous settings. These were not reproducible today at 45 ms PW.  Watch fluid status on jardiance, lasix, and spiro. Repeat BMET BNP today Update Echo in may.  2. HTN Stable on current regimen   3. CAD Denies s/s ischemia  Current medicines are reviewed at length with the patient today.    Disposition:  RTC 6 month. She is now at chronic settings. Down titrate  if she has further stim.   [name], PA-C  09/25/2021 11:41 AM  [facility] [address] [place] [address] [phone] (office) [phone] (fax)

## 2021-09-25 NOTE — Patient Instructions (Signed)
Medication Instructions:  Your physician recommends that you continue on your current medications as directed. Please refer to the Current Medication list given to you today.  *If you need a refill on your cardiac medications before your next appointment, please call your pharmacy*   Lab Work: TODAY: BMET, BNP  If you have labs (blood work) drawn today and your tests are completely normal, you will receive your results only by: [place] (if you have MyChart) OR A paper copy in the mail If you have any lab test that is abnormal or we need to change your treatment, we will call you to review the results.   Testing/Procedures: Your physician has requested that you have an echocardiogram in May at [facility]. Echocardiography is a painless test that uses sound waves to create images of your heart. It provides your doctor with information about the size and shape of your heart and how well your hearts chambers and valves are working. This procedure takes approximately one hour. There are no restrictions for this procedure.   Follow-Up: At [facility], you and your health needs are our priority.  As part of our continuing mission to provide you with exceptional heart care, we have created designated Provider Care Teams.  These Care Teams include your primary Cardiologist (physician) and Advanced Practice Providers (APPs -  Physician Assistants and Nurse Practitioners) who all work together to provide you with the care you need, when you need it.  We recommend signing up for the patient portal called "MyChart".  Sign up information is provided on this After Visit Summary.  MyChart is used to connect with patients for Virtual Visits (Telemedicine).  Patients are able to view lab/test results, encounter notes, upcoming appointments, etc.  Non-urgent messages can be sent to your provider as well.   To learn more about what you can do with MyChart, go to [email or web address].    Your next  appointment:   6 month(s)  The format for your next appointment:   In Person  Provider:   Legrand Como "Oda Kilts, PA-C

## 2021-09-26 LAB — BASIC METABOLIC PANEL
BUN/Creatinine Ratio: 14 (ref 12–28)
BUN: 13 mg/dL (ref 8–27)
CO2: 25 mmol/L (ref 20–29)
Calcium: 9 mg/dL (ref 8.7–10.3)
Chloride: 105 mmol/L (ref 96–106)
Creatinine, Ser: 0.94 mg/dL (ref 0.57–1.00)
Glucose: 71 mg/dL (ref 70–99)
Potassium: 4.6 mmol/L (ref 3.5–5.2)
Sodium: 140 mmol/L (ref 134–144)
eGFR: 64 mL/min/{1.73_m2} (ref >59)

## 2021-09-26 LAB — PRO B NATRIURETIC PEPTIDE: NT-Pro BNP: 751 pg/mL — ABNORMAL HIGH (ref 0–301)

## 2021-10-05 ENCOUNTER — Ambulatory Visit (INDEPENDENT_AMBULATORY_CARE_PROVIDER_SITE_OTHER): Payer: Medicare HMO

## 2021-10-05 ENCOUNTER — Institutional Professional Consult (permissible substitution): Payer: Medicare HMO | Admitting: Pulmonary Disease

## 2021-10-05 DIAGNOSIS — I5022 Chronic systolic (congestive) heart failure: Secondary | ICD-10-CM

## 2021-10-05 DIAGNOSIS — Z95810 Presence of automatic (implantable) cardiac defibrillator: Secondary | ICD-10-CM | POA: Diagnosis not present

## 2021-10-07 NOTE — Progress Notes (Signed)
EPIC Encounter for ICM Monitoring  Patient Name: [name] is a 71 y.o. female Date: 10/07/2021 Primary Care Physican: Lindell Spar, MD Primary Cardiologist: Branch Electrophysiologist: Allred Bi-V Pacing:  99%        10/05/2021 Weight: 228 lbs                                                            Spoke with patient and heart failure questions reviewed.  Pt reports she is doing well since recovering from stomach virus.     [place] Thoracic impedance suggesting normal but was suggesting dryness 11/13-11/29.   Prescribed:  Furosemide 20 mg take 1 tablet by mouth every other day.   Spironolactone 25 mg take 1 tablet daily.   Labs: 07/15/2021 Creatinine 1.13, BUN 20, Potassium 4.0, Sodium 138, GFR 52 05/13/2021 Creatinine 1.09, BUN 19, Potassium 4.5, Sodium 138, GFR 54, NT-Pro BNP 476 03/14/2020 Creatinine 1.32, BUN 33, Potassium 5.5, Sodium 135, GFR 40-47 A complete set of results can be found in Results Review.   Recommendations:  No changes and encouraged to call if experiencing any fluid symptoms.   Follow-up plan: ICM clinic phone appointment on 11/10/2020.    91 day device clinic remote transmission 11/09/2021.        EP/Cardiology Office Visits:   03/24/2022 with Oda Kilts, PA.    Copy of ICM check sent to Dr. Rayann Heman.   3 month ICM trend: 10/05/2021.    12-14 Month ICM trend:     [name], RN 10/07/2021 12:30 PM

## 2021-10-09 ENCOUNTER — Encounter: Payer: Self-pay | Admitting: Internal Medicine

## 2021-10-09 ENCOUNTER — Other Ambulatory Visit: Payer: Self-pay

## 2021-10-09 ENCOUNTER — Other Ambulatory Visit: Payer: Self-pay | Admitting: Internal Medicine

## 2021-10-09 MED ORDER — BLOOD GLUCOSE METER KIT: PACK | 0 refills | Status: DC

## 2021-10-12 ENCOUNTER — Ambulatory Visit: Payer: Medicare HMO | Admitting: Surgery

## 2021-10-28 ENCOUNTER — Ambulatory Visit (INDEPENDENT_AMBULATORY_CARE_PROVIDER_SITE_OTHER): Payer: Medicare HMO | Admitting: Vascular Surgery

## 2021-10-28 ENCOUNTER — Other Ambulatory Visit: Payer: Self-pay

## 2021-10-28 ENCOUNTER — Encounter: Payer: Self-pay | Admitting: Vascular Surgery

## 2021-10-28 VITALS — BP 123/78 | HR 85 | Temp 98.4°F | Resp 14 | Ht 65.0 in | Wt 226.0 lb

## 2021-10-28 DIAGNOSIS — I739 Peripheral vascular disease, unspecified: Secondary | ICD-10-CM

## 2021-10-28 DIAGNOSIS — E1140 Type 2 diabetes mellitus with diabetic neuropathy, unspecified: Secondary | ICD-10-CM | POA: Diagnosis not present

## 2021-10-28 DIAGNOSIS — Z794 Long term (current) use of insulin: Secondary | ICD-10-CM | POA: Diagnosis not present

## 2021-10-28 NOTE — Progress Notes (Signed)
[facility]ist [facility] [place]  Patient name: [name] MRN: [id number] DOB: [birth year] Sex: female  REASON FOR CONSULT: Evaluation of lower extremity arterial insufficiency  HPI: [name] is a 71 y.o. female, who is here today for discussion of lower extremity symptoms and noninvasive studies.  She reports that she has discomfort in her hips and posterior thighs with walking.  She reports that this only occurs after she begins to walk and stops with rest.  She specifically denies any similar discomfort when she is sitting standing or lying down.  She walks with a rolling walker and reports that this is due to this walking discomfort and also due to vertigo and instability.  She does not have any history of arterial rest pain and does not have any history of lower extremity tissue loss.  She does not smoke cigarettes.  Does have a history of significant cardiac disease.  She did undergo Barostim implant with Dr. Trula Slade in November 2022  Past Medical History:  Diagnosis Date   Anemia    Arthritis    CAD (coronary artery disease)    a. LHC (03/2014) Lmain: nl, LAD: diff dz proximal 20%, 30-40% dz mid vessel prior 2nd diagonal, LCx: 40% in OM1, 20-30% in OM2, RCA: 99% subtotal occlusion @ crux, TIMI 1 flow, L-R collaterals to distal vessel   Cardiomyopathy ([place]) 03/28/2014   LV dysfunction out of proportion to CAD 03/28/14   CHF (congestive heart failure) ([place])    Phreesia 78/46/9629   Chronic systolic heart failure ([place])    a. ECHO (03/2014): EF 25-30%, akinesis enteroanteroseptal myocardium, grade III DD b. RHC (03/2014) RA 4, RV 42/4, PA 44/14 (26), PCWP 21, PA 62% Fick CO/CI 6.9 / 3.4   Diabetes mellitus    Diabetes mellitus without complication ([place])    Phreesia 10/03/2020   Duodenal ulcer    Age 1   Dyspnea    Erosive esophagitis    Gastritis    Hypertension    Hypothyroidism    Obese    Short-term memory loss    Thyroid  disease    TTP (thrombotic thrombocytopenic purpura) ([place]) 06/2005    Family History  Problem Relation Age of Onset   Heart failure Mother    Cirrhosis Father        ETOH   Heart disease Brother    Diabetes Daughter    Breast cancer Maternal Aunt    Colon cancer Neg Hx     SOCIAL HISTORY: Social History   Socioeconomic History   Marital status: Divorced    Spouse name: Not on file   Number of children: 3   Years of education: 12   Highest education level: Some college, no degree  Occupational History   Occupation: disabled    Fish farm manager: NOT EMPLOYED  Tobacco Use   Smoking status: Former    Packs/day: 0.25    Years: 10.00    Pack years: 2.50    Types: Cigarettes    Start date: 09/20/1966    Quit date: 09/20/1978    Years since quitting: 43.1   Smokeless tobacco: Never  Vaping Use   Vaping Use: Never used  Substance and Sexual Activity   Alcohol use: No    Alcohol/week: 0.0 standard drinks   Drug use: No   Sexual activity: Not Currently  Other Topics Concern   Not on file  Social History Narrative   Lives Alone       Social Determinants of  Health   Financial Resource Strain: Low Risk    Difficulty of Paying Living Expenses: Not very hard  Food Insecurity: Food Insecurity Present   Worried About [place] in the Last Year: Sometimes true   Ran Out of Food in the Last Year: Sometimes true  Transportation Needs: No Transportation Needs   Lack of Transportation (Medical): No   Lack of Transportation (Non-Medical): No  Physical Activity: Insufficiently Active   Days of Exercise per Week: 2 days   Minutes of Exercise per Session: 20 min  Stress: No Stress Concern Present   Feeling of Stress : Not at all  Social Connections: Moderately Integrated   Frequency of Communication with Friends and Family: More than three times a week   Frequency of Social Gatherings with Friends and Family: Twice a week   Attends Religious Services: More than 4 times per year    Active Member of [employer] or Organizations: Yes   Attends Music therapist: More than 4 times per year   Marital Status: Divorced  Human resources officer Violence: Not At Risk   Fear of Current or Ex-Partner: No   Emotionally Abused: No   Physically Abused: No   Sexually Abused: No    Allergies  Allergen Reactions   Other Other (See Comments)    FLAGYL, CIPRO, PROTONIX taken at the same time caused patient to lose her breath and have trouble breathing, requiring hospital stay at [facility], 03/23/14-per patient.    Lisinopril Cough    Current Outpatient Medications  Medication Sig Dispense Refill   acetaminophen (TYLENOL) 500 MG tablet Take 1,000 mg by mouth every 6 (six) hours as needed for moderate pain.     aspirin 81 MG chewable tablet Chew 1 tablet (81 mg total) by mouth daily.     atorvastatin (LIPITOR) 80 MG tablet TAKE 1 TABLET EVERY DAY (Patient taking differently: Take 80 mg by mouth daily.) 90 tablet 1   blood glucose meter kit and supplies Dispense based on patient and insurance preference. Use up to four times daily as directed. (FOR ICD-10 E10.9, E11.9). 1 each 0   carvedilol (COREG) 25 MG tablet TAKE 1 TABLET TWICE DAILY (Patient taking differently: Take 25 mg by mouth 2 (two) times daily with a meal.) 180 tablet 3   Ciclopirox 0.77 % gel APPLY ONE TIME DAILY ON THE AFFECTED TOE NAIL (Patient taking differently: Apply 1 application topically 2 (two) times daily as needed (affected toe nail). APPLY ONE TIME DAILY ON THE AFFECTED TOE NAIL.) 30 g 0   empagliflozin (JARDIANCE) 10 MG TABS tablet Take 1 tablet (10 mg total) by mouth daily before breakfast. 14 tablet 0   furosemide (LASIX) 20 MG tablet Take 1 tablet (20 mg total) by mouth every other day. 45 tablet 3   insulin aspart (NOVOLOG) 100 UNIT/ML injection Inject 6 Units into the skin 3 (three) times daily before meals. (Patient taking differently: Inject 5 Units into the skin 3 (three) times daily before meals.)     LANTUS  100 UNIT/ML injection INJECT  40 UNITS SUBCUTANEOUSLY TWICE DAILY (Patient taking differently: Inject 30 Units into the skin 2 (two) times daily.) 70 mL 1   levothyroxine (SYNTHROID) 50 MCG tablet Take 1 tablet (50 mcg total) by mouth daily before breakfast. (Patient not taking: Reported on 09/07/2021) 90 tablet 1   losartan (COZAAR) 25 MG tablet TAKE 1 TABLET EVERY DAY (Patient taking differently: Take 25 mg by mouth daily.) 90 tablet 2   Multiple  Vitamin (MULTIVITAMIN) tablet Take 1 tablet by mouth daily.     omega-3 fish oil (MAXEPA) 1000 MG CAPS capsule Take 1 capsule by mouth daily.     polyethylene glycol (MIRALAX / GLYCOLAX) packet Take 17 g by mouth daily as needed for mild constipation.      spironolactone (ALDACTONE) 25 MG tablet TAKE 1 TABLET EVERY DAY (Patient taking differently: Take 25 mg by mouth daily.) 90 tablet 1   traMADol (ULTRAM) 50 MG tablet Take 1 tablet (50 mg total) by mouth every 6 (six) hours as needed. (Patient not taking: Reported on 09/07/2021) 20 tablet 0   No current facility-administered medications for this visit.    REVIEW OF SYSTEMS:  _0  denotes positive finding, _1  denotes negative finding Cardiac  Comments:  Chest pain or chest pressure:    Shortness of breath upon exertion: x   Short of breath when lying flat:    Irregular heart rhythm:        Vascular    Pain in calf, thigh, or hip brought on by ambulation:    Pain in feet at night that wakes you up from your sleep:     Blood clot in your veins:    Leg swelling:         Pulmonary    Oxygen at home:    Productive cough:     Wheezing:         Neurologic    Sudden weakness in arms or legs:     Sudden numbness in arms or legs:     Sudden onset of difficulty speaking or slurred speech:    Temporary loss of vision in one eye:     Problems with dizziness:         Gastrointestinal    Blood in stool:     Vomited blood:         Genitourinary    Burning when urinating:     Blood in urine:         Psychiatric    Major depression:         Hematologic    Bleeding problems:    Problems with blood clotting too easily:        Skin    Rashes or ulcers:        Constitutional    Fever or chills:      PHYSICAL EXAM: Vitals:   10/28/21 1334  BP: 123/78  Pulse: 85  Resp: 14  Temp: 98.4 F (36.9 C)  TempSrc: Temporal  SpO2: 96%  Weight: 226 lb (102.5 kg)  Height: _2  (1.651 m)    GENERAL: The patient is a well-nourished female, in no acute distress. The vital signs are documented above. CARDIOVASCULAR: I do not palpate femoral or pedal pulses bilaterally.  She is obese.  2+ radial pulses bilaterally. PULMONARY: There is good air exchange  MUSCULOSKELETAL: There are no major deformities or cyanosis. NEUROLOGIC: No focal weakness or paresthesias are detected. SKIN: There are no ulcers or rashes noted. PSYCHIATRIC: The patient has a normal affect.  DATA:  Noninvasive studies from [facility] from 06/22/21 revealed normal ankle arm index bilaterally but monophasic waveforms at the tibial vessels bilaterally  MEDICAL ISSUES: Had long discussion with the patient regarding this.  Her symptoms appear to be consistent with aortoiliac occlusive disease.  She does have monophasic flow in her pedal vessels bilaterally.  I have recommended CT angiogram for further evaluation of her arterial flow.  She will undergo CT  angio of her aorta and bilateral lower extremity runoff and I will see her back in the office for further discussion of this.  She understands that this is is not limb threatening ischemia.   Rosetta Posner, MD FACS [facility] [phone] Pager [phone]  Note: Portions of this report may have been transcribed using voice recognition software.  Every effort has been made to ensure accuracy; however, inadvertent computerized transcription errors may still be present.

## 2021-11-06 ENCOUNTER — Other Ambulatory Visit: Payer: Self-pay

## 2021-11-06 DIAGNOSIS — I739 Peripheral vascular disease, unspecified: Secondary | ICD-10-CM

## 2021-11-09 ENCOUNTER — Ambulatory Visit (INDEPENDENT_AMBULATORY_CARE_PROVIDER_SITE_OTHER): Payer: Medicare HMO

## 2021-11-09 DIAGNOSIS — I5022 Chronic systolic (congestive) heart failure: Secondary | ICD-10-CM

## 2021-11-09 LAB — CUP PACEART REMOTE DEVICE CHECK
Battery Remaining Longevity: 37 mo
Battery Remaining Percentage: 58 %
Battery Voltage: 2.95 V
Brady Statistic AP VP Percent: 1 %
Brady Statistic AP VS Percent: 1 %
Brady Statistic AS VP Percent: 97 %
Brady Statistic AS VS Percent: 1 %
Brady Statistic RA Percent Paced: 1 %
Date Time Interrogation Session: 20230220020029
HighPow Impedance: 62 Ohm
HighPow Impedance: 62 Ohm
Implantable Lead Implant Date: 20200813
Implantable Lead Implant Date: 20200813
Implantable Lead Implant Date: 20200813
Implantable Lead Location: 753858
Implantable Lead Location: 753859
Implantable Lead Location: 753860
Implantable Pulse Generator Implant Date: 20200813
Lead Channel Impedance Value: 340 Ohm
Lead Channel Impedance Value: 350 Ohm
Lead Channel Impedance Value: 390 Ohm
Lead Channel Pacing Threshold Amplitude: 0.5 V
Lead Channel Pacing Threshold Amplitude: 0.5 V
Lead Channel Pacing Threshold Amplitude: 1.625 V
Lead Channel Pacing Threshold Pulse Width: 0.4 ms
Lead Channel Pacing Threshold Pulse Width: 0.5 ms
Lead Channel Pacing Threshold Pulse Width: 0.6 ms
Lead Channel Sensing Intrinsic Amplitude: 12 mV
Lead Channel Sensing Intrinsic Amplitude: 5 mV
Lead Channel Setting Pacing Amplitude: 2 V
Lead Channel Setting Pacing Amplitude: 2 V
Lead Channel Setting Pacing Amplitude: 2.625
Lead Channel Setting Pacing Pulse Width: 0.5 ms
Lead Channel Setting Pacing Pulse Width: 0.6 ms
Lead Channel Setting Sensing Sensitivity: 0.5 mV
Pulse Gen Serial Number: 9891600

## 2021-11-10 ENCOUNTER — Ambulatory Visit (INDEPENDENT_AMBULATORY_CARE_PROVIDER_SITE_OTHER): Payer: Medicare HMO

## 2021-11-10 DIAGNOSIS — Z95810 Presence of automatic (implantable) cardiac defibrillator: Secondary | ICD-10-CM | POA: Diagnosis not present

## 2021-11-10 DIAGNOSIS — I5022 Chronic systolic (congestive) heart failure: Secondary | ICD-10-CM | POA: Diagnosis not present

## 2021-11-10 NOTE — Progress Notes (Signed)
EPIC Encounter for ICM Monitoring  Patient Name: [name] is a 71 y.o. female Date: 11/10/2021 Primary Care Physican: Lindell Spar, MD Primary Cardiologist: Branch Electrophysiologist: Allred Bi-V Pacing:  98%        11/10/2021 Weight: 228 lbs                                                            Spoke with patient and heart failure questions reviewed.  She is doing well and voices no complaints.   [place] Thoracic impedance suggesting normal but was suggesting dryness 2/13-2/17.   Prescribed:  Furosemide 20 mg take 1 tablet by mouth every other day.   Spironolactone 25 mg take 1 tablet daily.   Labs: 09/25/2021 Creatinine 0.94, BUN 13, Potassium 4.6, Sodium 140, GFR 64 09/09/2021 Creatinine 1.01, BUN 17, Potassium 4.6, Sodium 139, GFR 59  08/26/2021 Creatinine 1.42, BUN 26, Potassium 5.0, Sodium 138, GFR 39  07/15/2021 Creatinine 1.13, BUN 20, Potassium 4.0, Sodium 138, GFR 52 05/13/2021 Creatinine 1.09, BUN 19, Potassium 4.5, Sodium 138, GFR 54, NT-Pro BNP 476 03/14/2020 Creatinine 1.32, BUN 33, Potassium 5.5, Sodium 135, GFR 40-47 A complete set of results can be found in Results Review.   Recommendations:  No changes and encouraged to call if experiencing any fluid symptoms.   Follow-up plan: ICM clinic phone appointment on 12/14/2020.    91 day device clinic remote transmission 02/08/2022.        EP/Cardiology Office Visits:   11/11/2021 with Dr Harl Bowie.  03/24/2022 with Oda Kilts, PA.    Copy of ICM check sent to Dr. Rayann Heman.   3 month ICM trend: 11/10/2021.    12-14 Month ICM trend:     [name], RN 11/10/2021 4:44 PM

## 2021-11-11 ENCOUNTER — Encounter: Payer: Self-pay | Admitting: Cardiology

## 2021-11-11 ENCOUNTER — Ambulatory Visit (INDEPENDENT_AMBULATORY_CARE_PROVIDER_SITE_OTHER): Payer: Medicare HMO | Admitting: Cardiology

## 2021-11-11 VITALS — BP 130/76 | HR 74 | Ht 65.0 in | Wt 226.2 lb

## 2021-11-11 DIAGNOSIS — I5022 Chronic systolic (congestive) heart failure: Secondary | ICD-10-CM | POA: Diagnosis not present

## 2021-11-11 DIAGNOSIS — E782 Mixed hyperlipidemia: Secondary | ICD-10-CM | POA: Diagnosis not present

## 2021-11-11 DIAGNOSIS — I2510 Atherosclerotic heart disease of native coronary artery without angina pectoris: Secondary | ICD-10-CM | POA: Diagnosis not present

## 2021-11-11 NOTE — Patient Instructions (Signed)

## 2021-11-11 NOTE — Progress Notes (Signed)
Clinical Summary Ms. Leflore is a 71 y.o.female  seen today for follow up of the following meidcal problems.    1. Chronic combined systolic and diastolic heart failure - echo 03/2014 LVEF 25-30%, restrictive diastolic dysfunction. New diagnosis at that time. Repeat echo 06/2014 LVEF 40-45% - cath 03/2014 showed RCA 99% with left to right collaterals, other arteries patent. Overall LV systolic dysfunction out of proportion to CAD.  Medically managed.  - 07/2015 echo LVEF 94%, grade I diastolic dysfunction   - Entresto causes dizziness and nausea, stopped taking. Back on losartan   -  due to low bp's we lowered losartan to 23m daily. Lowered lasix to 218mdaily, may take 4051ms needed.    01/2018 echo LVEF 35%, grade I diastoilc dysfunction  - 11/2018 echo LVEF 30-35% - 05/03/19 had BIV AICD placed by Dr AllRayann Heman3/2021 LVEF 30-35% 07/2021 barostim implant    -no SOB/DOE. No recent edema. Home weights 226 lbs, which her baseline - compliant with meds - taking lasix every other day.       2. CAD - cath 03/2014  with 99% chronic RCA disease, otherwise patent vessels. Managed medically   - no recent chest pains.    3. OSA  - on bipap at night. Followed by Dr TurRadford Pax- establishing with Dr SooHalford Chessman [place], closer to her home   4. Hyperlipidemia -upcoming labs next month 02/2020 TC 76 HDL 33 TG 75 LDL 27 - labs followed by pcp   5. Chronic LBBB     6. Leg pains - back of thighs and calves with walking, resolves with rest - ABIs were normal but monophasic waveforms suggesting disease. Followed by vascualr with plans for CTA     Past Medical History:  Diagnosis Date   Anemia    Arthritis    CAD (coronary artery disease)    a. LHC (03/2014) Lmain: nl, LAD: diff dz proximal 20%, 30-40% dz mid vessel prior 2nd diagonal, LCx: 40% in OM1, 20-30% in OM2, RCA: 99% subtotal occlusion @ crux, TIMI 1 flow, L-R collaterals to distal vessel   Cardiomyopathy ([facility]7/05/2014   LV  dysfunction out of proportion to CAD 03/28/14   CHF (congestive heart failure) ([place]  Phreesia 01/85/46/2703Chronic systolic heart failure ([place]  a. ECHO (03/2014): EF 25-30%, akinesis enteroanteroseptal myocardium, grade III DD b. RHC (03/2014) RA 4, RV 42/4, PA 44/14 (26), PCWP 21, PA 62% Fick CO/CI 6.9 / 3.4   Diabetes mellitus    Diabetes mellitus without complication ([place]  Phreesia 10/03/2020   Duodenal ulcer    Age 21   Dyspnea    Erosive esophagitis    Gastritis    Hypertension    Hypothyroidism    Obese    Short-term memory loss    Thyroid disease    TTP (thrombotic thrombocytopenic purpura) (HC[place]0/2006     Allergies  Allergen Reactions   Other Other (See Comments)    FLAGYL, CIPRO, PROTONIX taken at the same time caused patient to lose her breath and have trouble breathing, [facility] hospital stay [facility]/4/15-per patient.    Lisinopril Cough     Current Outpatient Medications  Medication Sig Dispense Refill   acetaminophen (TYLENOL) 500 MG tablet Take 1,000 mg by mouth every 6 (six) hours as needed for moderate pain.     aspirin 81 MG chewable tablet Chew 1 tablet (81 mg total) by mouth  daily.     atorvastatin (LIPITOR) 80 MG tablet TAKE 1 TABLET EVERY DAY (Patient taking differently: Take 80 mg by mouth daily.) 90 tablet 1   blood glucose meter kit and supplies Dispense based on patient and insurance preference. Use up to four times daily as directed. (FOR ICD-10 E10.9, E11.9). 1 each 0   carvedilol (COREG) 25 MG tablet TAKE 1 TABLET TWICE DAILY (Patient taking differently: Take 25 mg by mouth 2 (two) times daily with a meal.) 180 tablet 3   Ciclopirox 0.77 % gel APPLY ONE TIME DAILY ON THE AFFECTED TOE NAIL (Patient taking differently: Apply 1 application topically 2 (two) times daily as needed (affected toe nail). APPLY ONE TIME DAILY ON THE AFFECTED TOE NAIL.) 30 g 0   empagliflozin (JARDIANCE) 10 MG TABS tablet Take 1 tablet (10 mg total) by mouth daily  before breakfast. 14 tablet 0   furosemide (LASIX) 20 MG tablet Take 1 tablet (20 mg total) by mouth every other day. 45 tablet 3   insulin aspart (NOVOLOG) 100 UNIT/ML injection Inject 6 Units into the skin 3 (three) times daily before meals. (Patient taking differently: Inject 5 Units into the skin 3 (three) times daily before meals.)     LANTUS 100 UNIT/ML injection INJECT  40 UNITS SUBCUTANEOUSLY TWICE DAILY (Patient taking differently: Inject 30 Units into the skin 2 (two) times daily.) 70 mL 1   levothyroxine (SYNTHROID) 50 MCG tablet Take 1 tablet (50 mcg total) by mouth daily before breakfast. (Patient not taking: Reported on 09/07/2021) 90 tablet 1   losartan (COZAAR) 25 MG tablet TAKE 1 TABLET EVERY DAY (Patient taking differently: Take 25 mg by mouth daily.) 90 tablet 2   Multiple Vitamin (MULTIVITAMIN) tablet Take 1 tablet by mouth daily.     omega-3 fish oil (MAXEPA) 1000 MG CAPS capsule Take 1 capsule by mouth daily.     polyethylene glycol (MIRALAX / GLYCOLAX) packet Take 17 g by mouth daily as needed for mild constipation.      spironolactone (ALDACTONE) 25 MG tablet TAKE 1 TABLET EVERY DAY (Patient taking differently: Take 25 mg by mouth daily.) 90 tablet 1   traMADol (ULTRAM) 50 MG tablet Take 1 tablet (50 mg total) by mouth every 6 (six) hours as needed. (Patient not taking: Reported on 09/07/2021) 20 tablet 0   No current facility-administered medications for this visit.     Past Surgical History:  Procedure Laterality Date   BIV ICD INSERTION CRT-D N/A 05/03/2019   Procedure: BIV ICD INSERTION CRT-D;  Surgeon: Thompson Grayer, MD;  Location: [place] [facility];  Service: Cardiovascular;  Laterality: N/A;   CARDIAC CATHETERIZATION     COLONOSCOPY  08/29/2012   XTA:[id number] diverticulosis   COLONOSCOPY N/A 05/28/2015   Procedure: COLONOSCOPY;  Surgeon: Daneil Dolin, MD;  [facility];  Service: Endoscopy;  Laterality: N/A;  1015   ECTOPIC PREGNANCY SURGERY      ESOPHAGOGASTRODUODENOSCOPY  04/20/2012   AXK:[id number] antral gastritis/Erosive reflux esophagitis   LEFT AND RIGHT HEART CATHETERIZATION WITH CORONARY ANGIOGRAM N/A 03/28/2014   Procedure: LEFT AND RIGHT HEART CATHETERIZATION WITH CORONARY ANGIOGRAM;  Surgeon: Peter M Martinique, MD;  Location: [facility] CATH LAB;  Service: Cardiovascular;  Laterality: N/A;   SPLENECTOMY  07/20/2005     Allergies  Allergen Reactions   Other Other (See Comments)    FLAGYL, CIPRO, PROTONIX taken at the same time caused patient to lose her breath and have trouble breathing, requiring hospital stay at [facility], 03/23/14-per  patient.    Lisinopril Cough      Family History  Problem Relation Age of Onset   Heart failure Mother    Cirrhosis Father        ETOH   Heart disease Brother    Diabetes Daughter    Breast cancer Maternal Aunt    Colon cancer Neg Hx      Social History Ms. Monterroso reports that she quit smoking about 43 years ago. Her smoking use included cigarettes. She started smoking about 55 years ago. She has a 2.50 pack-year smoking history. She has never used smokeless tobacco. Ms. Andress reports no history of alcohol use.   Review of Systems CONSTITUTIONAL: No weight loss, fever, chills, weakness or fatigue.  HEENT: Eyes: No visual loss, blurred vision, double vision or yellow sclerae.No hearing loss, sneezing, congestion, runny nose or sore throat.  SKIN: No rash or itching.  CARDIOVASCULAR: per hpi RESPIRATORY: No shortness of breath, cough or sputum.  GASTROINTESTINAL: No anorexia, nausea, vomiting or diarrhea. No abdominal pain or blood.  GENITOURINARY: No burning on urination, no polyuria NEUROLOGICAL: No headache, dizziness, syncope, paralysis, ataxia, numbness or tingling in the extremities. No change in bowel or bladder control.  MUSCULOSKELETAL: No muscle, back pain, joint pain or stiffness.  LYMPHATICS: No enlarged nodes. No history of splenectomy.  PSYCHIATRIC: No history of depression or  anxiety.  ENDOCRINOLOGIC: No reports of sweating, cold or heat intolerance. No polyuria or polydipsia.  Marland Kitchen   Physical Examination Today's Vitals   11/11/21 0858  BP: 130/76  Pulse: 74  SpO2: 98%  Weight: 226 lb 3.2 oz (102.6 kg)  Height: _0  (1.651 m)   Body mass index is 37.64 kg/m.  Gen: resting comfortably, no acute distress HEENT: no scleral icterus, pupils equal round and reactive, no palptable cervical adenopathy,  CV: RRR, no m/rg no jvd Resp: Clear to auscultation bilaterally GI: abdomen is soft, non-tender, non-distended, normal bowel sounds, no hepatosplenomegaly MSK: extremities are warm, no edema.  Skin: warm, no rash Neuro:  no focal deficits Psych: appropriate affect   Diagnostic Studies 03/2014 echo Study Conclusions  - Left ventricle: The cavity size was normal. There was mild focal basal hypertrophy of the septum. Systolic function was severely reduced. The estimated ejection fraction was in the range of 25% to 30%. There is akinesis of the entireanteroseptal myocardium. Doppler parameters are consistent with a reversible restrictive pattern, indicative of decreased left ventricular diastolic compliance and/or increased left atrial pressure (grade 3 diastolic dysfunction). - Mitral valve: There was mild regurgitation. - Left atrium: The atrium was moderately dilated.  03/2014 Cath Procedural Findings:   Hemodynamics   RA 8/0 mean 4 mm Hg   RV 42/4 mm Hg   PA 44/14 mean 26 mm Hg   PCWP 20/28 mean 21 mm Hg   LV 98/18 mm Hg   AO 95/54 mean 72 mm Hg   Oxygen saturations:   PA 62%   AO 90%   Cardiac Output (Fick) 6.9 L/min   Cardiac Index (Fick) 3.4 L/min/meter squared   Coronary angiography:   Coronary dominance: right   Left mainstem: Normal   Left anterior descending (LAD): Diffuse disease in the proximal vessel up to 20%. 30-40% disease in the mid vessel prior to the second diagonal.   Left circumflex (LCx): 40% in OM1. 20-30% in OM2.  Otherwise no significant disease.   Right coronary artery (RCA): 99% subtotal occlusion at the crux. TIMI 1 flow. Left to right collaterals to the  distal vessel.   Left ventriculography: Left ventricular systolic function is abnormal, LVEF is estimated at 25%. There is inferior akinesis and severe global hypokinesis. There is mild mitral regurgitation   Final Conclusions:   1. Single vessel obstructive CAD with subtotal occlusion of the RCA at the crux.   2. Severe LV dysfunction.   3. Normal Right heart pressures. Elevated PCWP.   Recommendations: Medical management. Her degree of LV dysfunction is out of proportion to her CAD. She has no anginal symptoms so PCI of RCA not indicated. The inferior wall appears scarred.   07/2015 echo Study Conclusions   - Left ventricle: The cavity size was at the upper limits of   normal. Wall thickness was increased in a pattern of moderate   LVH. The estimated ejection fraction was 40% (similar calculation   per biplane speckle tracking, reduced global longitudinal strain   of -14.3%). There is akinesis of the basalinferolateral and   inferior myocardium. Doppler parameters are consistent with   abnormal left ventricular relaxation (grade 1 diastolic   dysfunction). - Aortic valve: Mildly calcified annulus. Trileaflet. - Mitral valve: Calcified annulus. There was trivial regurgitation. - Left atrium: The atrium was at the upper limits of normal in   size. - Right atrium: Central venous pressure (est): 3 mm Hg. - Atrial septum: A patent foramen ovale cannot be excluded. - Tricuspid valve: There was trivial regurgitation. - Pulmonary arteries: Systolic pressure could not be accurately   estimated. - Pericardium, extracardiac: There was no pericardial effusion.   Impressions:   - Upper normal LV chamber size with moderate LVH and LVEF   approximately 40% as discussed above. There is akinesis of the   basal inferoseptal and inferior myocardium.  Grade 1 diastolic   dysfunction. Compared to the previous study from October 2015,   LVEF is in similar range, perhaps slightly reduced. Upper normal   left atrial chamber size. Trivial tricuspid regurgitation. Cannot   exclude PFO based on limited images.     01/2018 echo Study Conclusions   - Left ventricle: The cavity size was normal. Wall thickness was   increased in a pattern of mild LVH. The estimated ejection   fraction was 35%. There is akinesis of the basal-midinferior   myocardium. There is akinesis of the mid-apicalanteroseptal and   apical myocardium. Doppler parameters are consistent with   abnormal left ventricular relaxation (grade 1 diastolic   dysfunction). - Ventricular septum: Septal motion showed abnormal function and   dyssynergy suggestive of left bundle Louden Houseworth block. - Aortic valve: Moderately calcified annulus. Trileaflet. - Mitral valve: There was mild regurgitation. - Right atrium: Central venous pressure (est): 3 mm Hg. - Atrial septum: No defect or patent foramen ovale was identified. - Tricuspid valve: There was trivial regurgitation. - Pulmonary arteries: Systolic pressure could not be accurately   estimated. - Pericardium, extracardiac: There was no pericardial effusion     11/2018 echo 1. The left ventricle has moderate-severely reduced systolic function, with an ejection fraction of 30-35%. The cavity size was normal. There is mildly increased left ventricular wall thickness. Left ventricular diastolic Doppler parameters are  consistent with impaired relaxation. Elevated mean left atrial pressure.  2. The anteroseptal, anterolateral, apical walls are hypokinetic.  3. The right ventricle has normal systolic function. The cavity was normal. There is no increase in right ventricular wall thickness.  4. Left atrial size was moderately dilated.  5. No evidence of mitral valve stenosis.  6. The aortic valve is  tricuspid no stenosis of the aortic valve.   7. Pulmonary hypertension is indeterminant, inadequate TR jet.  8. The inferior vena cava was dilated in size with <50% respiratory variability.  9. The interatrial septum was not well visualized.  05/2021 ABI FINDINGS: Right ABI:  1.22   Left ABI:  1.14   Right Lower Extremity:  Monophasic waveforms are seen.   Left Lower Extremity:  Monophasic waveforms are seen.   IMPRESSION: Although ABI values are within normal limits, bilateral monophasic waveforms are still suspicious for significant underlying arterial occlusive disease. Further evaluation with CT angiography of the abdominal aorta and bilateral lower extremity should be considered.   Assessment and Plan   1. Chronic combined systolic/diastolic HF - medical therapy limited by soft bp's. Did not tolerate entresto. She is on her maximally tolerated regimen - s/p BIV AICD, barostim - no significant symptoms, she is euvoelmic today - continue current meds   2. CAD - medically managed single vessel RCA disease - no symptoms, continue currentmeds   3. Hyperlipidemia - labs followe dby pcp, continue statin   4. OSA - had been followed by Dr Radford Pax, difficult for patient to get to [facility] with Dr Halford Chessman    5. Leg pains - followed by vascular, upcoming CTA   F/u 6 months  Arnoldo Lenis, M.D.

## 2021-11-16 NOTE — Progress Notes (Signed)
Remote ICD transmission.   

## 2021-11-17 ENCOUNTER — Other Ambulatory Visit: Payer: Self-pay | Admitting: Cardiology

## 2021-11-30 ENCOUNTER — Ambulatory Visit (HOSPITAL_COMMUNITY)
Admission: RE | Admit: 2021-11-30 | Discharge: 2021-11-30 | Disposition: A | Discharge status: Home / Self Care | Payer: Medicare HMO | Source: Ambulatory Visit | Attending: Vascular Surgery | Admitting: Vascular Surgery

## 2021-11-30 ENCOUNTER — Other Ambulatory Visit: Payer: Self-pay

## 2021-11-30 DIAGNOSIS — I739 Peripheral vascular disease, unspecified: Secondary | ICD-10-CM | POA: Diagnosis not present

## 2021-11-30 DIAGNOSIS — K449 Diaphragmatic hernia without obstruction or gangrene: Secondary | ICD-10-CM | POA: Diagnosis not present

## 2021-11-30 DIAGNOSIS — I743 Embolism and thrombosis of arteries of the lower extremities: Secondary | ICD-10-CM | POA: Diagnosis not present

## 2021-11-30 DIAGNOSIS — Q8909 Congenital malformations of spleen: Secondary | ICD-10-CM | POA: Diagnosis not present

## 2021-11-30 DIAGNOSIS — M47816 Spondylosis without myelopathy or radiculopathy, lumbar region: Secondary | ICD-10-CM | POA: Diagnosis not present

## 2021-11-30 MED ORDER — IOHEXOL 350 MG/ML SOLN
100.0000 mL | Freq: Once | INTRAVENOUS | Status: AC | PRN
Start: 2021-11-30 — End: 2021-11-30
  Administered 2021-11-30: 80 mL via INTRAVENOUS

## 2021-12-02 ENCOUNTER — Ambulatory Visit (INDEPENDENT_AMBULATORY_CARE_PROVIDER_SITE_OTHER): Payer: Medicare HMO | Admitting: Vascular Surgery

## 2021-12-02 ENCOUNTER — Encounter: Payer: Self-pay | Admitting: Vascular Surgery

## 2021-12-02 ENCOUNTER — Other Ambulatory Visit: Payer: Self-pay

## 2021-12-02 VITALS — BP 110/68 | HR 87 | Ht 65.0 in | Wt 223.4 lb

## 2021-12-02 DIAGNOSIS — I739 Peripheral vascular disease, unspecified: Secondary | ICD-10-CM

## 2021-12-02 LAB — POCT I-STAT CREATININE: Creatinine, Ser: 1.3 mg/dL — ABNORMAL HIGH (ref 0.44–1.00)

## 2021-12-02 NOTE — Progress Notes (Signed)
? ? [facility] [place] ? ?Patient name: [name] MRN: [id number] DOB: 1950-09-28 Sex: female ? ?REASON FOR VISIT: Follow-up CT scan for claudication type symptoms ? ?HPI: ?[name] is a 72 y.o. female here today for follow-up.  She underwent CT angiogram of her abdomen pelvis and runoff.  I saw her initially in the office on 10/28/2021 with symptoms consistent with aortoiliac occlusive disease.  Noninvasive studies showed some dysfunction bilaterally with a questionable level.  She continues to have no change in her symptoms.  She does walk with a walker related to stability issues ? ?Past Medical History:  ?Diagnosis Date  ? Anemia   ? Arthritis   ? CAD (coronary artery disease)   ? a. LHC (03/2014) Lmain: nl, LAD: diff dz proximal 20%, 30-40% dz mid vessel prior 2nd diagonal, LCx: 40% in OM1, 20-30% in OM2, RCA: 99% subtotal occlusion @ crux, TIMI 1 flow, L-R collaterals to distal vessel  ? Cardiomyopathy ([place]) 03/28/2014  ? LV dysfunction out of proportion to CAD 03/28/14  ? CHF (congestive heart failure) ([place])   ? Phreesia 10/03/2020  ? Chronic systolic heart failure ([place])   ? a. ECHO (03/2014): EF 25-30%, akinesis enteroanteroseptal myocardium, grade III DD b. RHC (03/2014) RA 4, RV 42/4, PA 44/14 (26), PCWP 21, PA 62% Fick CO/CI 6.9 / 3.4  ? Diabetes mellitus   ? Diabetes mellitus without complication ([place])   ? Phreesia 10/03/2020  ? Duodenal ulcer   ? Age 19  ? Dyspnea   ? Erosive esophagitis   ? Gastritis   ? Hypertension   ? Hypothyroidism   ? Obese   ? Short-term memory loss   ? Thyroid disease   ? TTP (thrombotic thrombocytopenic purpura) ([place]) 06/2005  ? ? ?Family History  ?Problem Relation Age of Onset  ? Heart failure Mother   ? Cirrhosis Father   ?     ETOH  ? Heart disease Brother   ? Diabetes Daughter   ? Breast cancer Maternal Aunt   ? Colon cancer Neg Hx   ? ? ?SOCIAL HISTORY: ?Social History  ? ?Tobacco Use  ? Smoking status: Former   ?  Packs/day: 0.25  ?  Years: 10.00  ?  Pack years: 2.50  ?  Types: Cigarettes  ?  Start date: 09/20/1966  ?  Quit date: 09/20/1978  ?  Years since quitting: 43.2  ? Smokeless tobacco: Never  ?Substance Use Topics  ? Alcohol use: No  ?  Alcohol/week: 0.0 standard drinks  ? ? ?Allergies  ?Allergen Reactions  ? Other Other (See Comments)  ?  FLAGYL, CIPRO, PROTONIX taken at the same time caused patient to lose her breath and have trouble breathing, requiring hospital stay at [facility], 03/23/14-per patient.   ? Lisinopril Cough  ? ? ?Current Outpatient Medications  ?Medication Sig Dispense Refill  ? acetaminophen (TYLENOL) 500 MG tablet Take 1,000 mg by mouth every 6 (six) hours as needed for moderate pain.    ? aspirin 81 MG chewable tablet Chew 1 tablet (81 mg total) by mouth daily.    ? atorvastatin (LIPITOR) 80 MG tablet TAKE 1 TABLET EVERY DAY 90 tablet 2  ? blood glucose meter kit and supplies Dispense based on patient and insurance preference. Use up to four times daily as directed. (FOR ICD-10 E10.9, E11.9). 1 each 0  ? carvedilol (COREG) 25 MG tablet TAKE 1 TABLET TWICE DAILY (Patient taking differently: Take 25 mg by mouth 2 (  two) times daily with a meal.) 180 tablet 3  ? Ciclopirox 0.77 % gel APPLY ONE TIME DAILY ON THE AFFECTED TOE NAIL (Patient taking differently: Apply 1 application. topically 2 (two) times daily as needed (affected toe nail). APPLY ONE TIME DAILY ON THE AFFECTED TOE NAIL.) 30 g 0  ? empagliflozin (JARDIANCE) 10 MG TABS tablet Take 1 tablet (10 mg total) by mouth daily before breakfast. 14 tablet 0  ? furosemide (LASIX) 20 MG tablet Take 1 tablet (20 mg total) by mouth every other day. 45 tablet 3  ? insulin aspart (NOVOLOG) 100 UNIT/ML injection Inject 6 Units into the skin 3 (three) times daily before meals. (Patient taking differently: Inject 5 Units into the skin 3 (three) times daily before meals.)    ? LANTUS 100 UNIT/ML injection INJECT  40 UNITS SUBCUTANEOUSLY TWICE DAILY (Patient taking  differently: Inject 30 Units into the skin 2 (two) times daily.) 70 mL 1  ? levothyroxine (SYNTHROID) 50 MCG tablet Take 1 tablet (50 mcg total) by mouth daily before breakfast. 90 tablet 1  ? losartan (COZAAR) 25 MG tablet TAKE 1 TABLET EVERY DAY (Patient taking differently: Take 25 mg by mouth daily.) 90 tablet 2  ? Multiple Vitamin (MULTIVITAMIN) tablet Take 1 tablet by mouth daily.    ? polyethylene glycol (MIRALAX / GLYCOLAX) packet Take 17 g by mouth daily as needed for mild constipation.     ? spironolactone (ALDACTONE) 25 MG tablet TAKE 1 TABLET EVERY DAY 90 tablet 2  ? ?No current facility-administered medications for this visit.  ? ? ?REVIEW OF SYSTEMS:  ?_0  denotes positive finding, _1  denotes negative finding ?Cardiac  Comments:  ?Chest pain or chest pressure:    ?Shortness of breath upon exertion:    ?Short of breath when lying flat:    ?Irregular heart rhythm:    ?    ?Vascular    ?Pain in calf, thigh, or hip brought on by ambulation: x   ?Pain in feet at night that wakes you up from your sleep:     ?Blood clot in your veins:    ?Leg swelling:     ?    ? ? ?PHYSICAL EXAM: ?Vitals:  ? 12/02/21 1033  ?BP: 110/68  ?Pulse: 87  ?Weight: 223 lb 6.4 oz (101.3 kg)  ?Height: _2  (1.651 m)  ? ? ?GENERAL: The patient is a well-nourished female, in no acute distress. The vital signs are documented above. ?CARDIOVASCULAR: 1+ left dorsalis pedis pulse.  Absent right pedal pulses.  Absent popliteal pulses ?PULMONARY: There is good air exchange  ?MUSCULOSKELETAL: There are no major deformities or cyanosis. ?NEUROLOGIC: No focal weakness or paresthesias are detected. ?SKIN: There are no ulcers or rashes noted. ?PSYCHIATRIC: The patient has a normal affect. ? ?DATA:  ?CT scan images were reviewed with the patient.  This shows no evidence of aortoiliac occlusive disease.  She has normal flow through her femoral and superficial femoral arteries with diffuse nonlimiting disease in her popliteal and tibial  vessels ? ?MEDICAL ISSUES: ?Discussed the findings at length with the patient.  She does not have any evidence of arterial occlusive disease to explain her hip and buttock discomfort.  Suspect this is related to musculoskeletal or neurogenic issues.  She was reassured that this is not limb threatening.  She will see [place] again on an as-needed basis ? ? ? ?Rosetta Posner, MD FACS ?[facility] [place] ?Office Tel [phone] ? ?Note: Portions of this  report may have been transcribed using voice recognition software.  Every effort has been made to ensure accuracy; however, inadvertent computerized transcription errors may still be present. ?

## 2021-12-14 ENCOUNTER — Ambulatory Visit (INDEPENDENT_AMBULATORY_CARE_PROVIDER_SITE_OTHER): Payer: Medicare HMO

## 2021-12-14 DIAGNOSIS — Z95810 Presence of automatic (implantable) cardiac defibrillator: Secondary | ICD-10-CM | POA: Diagnosis not present

## 2021-12-14 DIAGNOSIS — I5022 Chronic systolic (congestive) heart failure: Secondary | ICD-10-CM

## 2021-12-16 NOTE — Progress Notes (Signed)
EPIC Encounter for ICM Monitoring ? ?Patient Name: [name] is a 71 y.o. female ?Date: 12/16/2021 ?Primary Care Physican: Lindell Spar, MD ?Primary Cardiologist: Harl Bowie ?Electrophysiologist: Allred ?Bi-V Pacing:  98%        ?11/10/2021 Weight: 228 lbs  ?12/16/2021 Weight: 226 lbs ?                                                        ?  ?Spoke with patient and heart failure questions reviewed.  She is doing well and voices no complaints. ?  ?Corvue Thoracic impedance suggesting normal fluid levels. ?  ?Prescribed:  ?Furosemide 20 mg take 1 tablet by mouth every other day.   ?Spironolactone 25 mg take 1 tablet daily. ?  ?Labs: ?09/25/2021 Creatinine 0.94, BUN 13, Potassium 4.6, Sodium 140, GFR 64 ?09/09/2021 Creatinine 1.01, BUN 17, Potassium 4.6, Sodium 139, GFR 59  ?08/26/2021 Creatinine 1.42, BUN 26, Potassium 5.0, Sodium 138, GFR 39  ?A complete set of results can be found in Results Review. ?  ?Recommendations:  No changes and encouraged to call if experiencing any fluid symptoms. ?  ?Follow-up plan: ICM clinic phone appointment on 01/18/2021.    91 day device clinic remote transmission 02/08/2022.      ?  ?EP/Cardiology Office Visits:   05/17/2022 with Dr Harl Bowie.  03/24/2022 with Oda Kilts, PA.  ?  ?Copy of ICM check sent to Dr. Rayann Heman.  ? ?3 month ICM trend: 12/14/2021. ? ? ? ?12-14 Month ICM trend:  ? ? ? ?[name], RN ?12/16/2021 ?4:43 PM ? ?

## 2021-12-17 ENCOUNTER — Institutional Professional Consult (permissible substitution): Payer: Medicare HMO | Admitting: Pulmonary Disease

## 2022-01-01 ENCOUNTER — Other Ambulatory Visit: Payer: Self-pay

## 2022-01-01 ENCOUNTER — Telehealth: Payer: Self-pay | Admitting: Cardiology

## 2022-01-01 MED ORDER — LOSARTAN POTASSIUM 25 MG PO TABS: 25.0000 mg | ORAL_TABLET | Freq: Every day | ORAL | 3 refills | Status: DC

## 2022-01-01 MED ORDER — LEVOTHYROXINE SODIUM 50 MCG PO TABS
50.0000 ug | ORAL_TABLET | Freq: Every day | ORAL | 1 refills | Status: DC
Start: 2022-01-01 — End: 2022-09-27

## 2022-01-01 NOTE — Telephone Encounter (Signed)
Done

## 2022-01-01 NOTE — Telephone Encounter (Signed)
?*  STAT* If patient is at the pharmacy, call can be transferred to refill team. ? ? ?1. Which medications need to be refilled? (please list name of each medication and dose if known) Losartan ? ?2. Which pharmacy/location (including street and city if local pharmacy) is medication to be sent to? [facility] ? ?3. Do they need a 30 day or 90 day supply? 90 days and refills ? ?

## 2022-01-04 ENCOUNTER — Other Ambulatory Visit: Payer: Self-pay | Admitting: *Deleted

## 2022-01-04 ENCOUNTER — Telehealth: Payer: Self-pay

## 2022-01-04 MED ORDER — BLOOD GLUCOSE METER KIT: PACK | 0 refills | Status: DC

## 2022-01-04 NOTE — Telephone Encounter (Signed)
Pt prescription faxed  ?

## 2022-01-04 NOTE — Telephone Encounter (Signed)
Patient has been without her meter for 3 days.  She said [facility] has tried to get in touch with [place] and could not.  She is asking you fax a rx for the meter to [phone] ?

## 2022-01-12 ENCOUNTER — Ambulatory Visit: Payer: Medicare HMO | Admitting: Cardiology

## 2022-01-18 ENCOUNTER — Ambulatory Visit (INDEPENDENT_AMBULATORY_CARE_PROVIDER_SITE_OTHER): Payer: Medicare HMO

## 2022-01-18 ENCOUNTER — Ambulatory Visit (HOSPITAL_COMMUNITY): Admission: RE | Admit: 2022-01-18 | Payer: Medicare HMO | Source: Ambulatory Visit

## 2022-01-18 ENCOUNTER — Other Ambulatory Visit (HOSPITAL_COMMUNITY): Payer: Medicare HMO

## 2022-01-18 DIAGNOSIS — Z95810 Presence of automatic (implantable) cardiac defibrillator: Secondary | ICD-10-CM | POA: Diagnosis not present

## 2022-01-18 DIAGNOSIS — I5022 Chronic systolic (congestive) heart failure: Secondary | ICD-10-CM

## 2022-01-22 ENCOUNTER — Ambulatory Visit (INDEPENDENT_AMBULATORY_CARE_PROVIDER_SITE_OTHER): Payer: Medicare HMO | Admitting: Pulmonary Disease

## 2022-01-22 ENCOUNTER — Encounter: Payer: Self-pay | Admitting: Pulmonary Disease

## 2022-01-22 ENCOUNTER — Encounter: Payer: Medicare HMO | Admitting: Internal Medicine

## 2022-01-22 DIAGNOSIS — I5042 Chronic combined systolic (congestive) and diastolic (congestive) heart failure: Secondary | ICD-10-CM

## 2022-01-22 DIAGNOSIS — G4733 Obstructive sleep apnea (adult) (pediatric): Secondary | ICD-10-CM | POA: Diagnosis not present

## 2022-01-22 NOTE — Progress Notes (Signed)
? ?Subjective:  ? ? Patient ID: [name], female    DOB: [birth year], 71 y.o.   MRN: [id number] ? ?HPI ?71 year old woman with chronic systolic heart failure presents to establish care for OSA ? ?OSA was diagnosed in 2017 and then reassessed in 2020 by Dr. Radford Pax from cardiology.  She was placed on a BiPAP machine which she used for a little while but does not remember whether this helped her or not.  Machine was recalled during the [place] and she returned the machine more than a year ago and does not repeat a replacement yet.  She was referred by cardiology for evaluation and management ? ?PMH -HFrEF, EF 30 to 35%, biventricular AICD, barostim implant ?-CAD, left bundle branch block ?-TTP status post splenectomy ? ?Effort sleepiness score is 13 and she reports some sleepiness while sitting and reading and watching TV.  She lays in bed for most 12 hours does not sleep all the time.  She is either reading, using the phone or watching TV ?Bedtime is around 9 PM, sleep latency about 30 minutes, she sleeps on her side with 2 pillows, reports multiple nocturnal awakenings every 2-3 hours including nocturia x3 and is out of bed by 9 AM feeling tired with dryness of mouth but denies headaches. ?She has lost 15 pounds since her last sleep study ? ?There is no history suggestive of cataplexy, sleep paralysis or parasomnias ? ? ?Significant tests/ events reviewed ? ?NPSG 04/2016 -weight 230 pounds -AHI 49/hour ?NPSG 06/2019 238 pounds AHI 78/hour, lowest desat 89%, not corrected by CPAP ? ?BiPAP titration 08/2019 15/11 with medium full facemask, centrals emerged 10/hour ? ? ? ? ?Past Medical History:  ?Diagnosis Date  ? Anemia   ? Arthritis   ? CAD (coronary artery disease)   ? a. LHC (03/2014) [place]: nl, LAD: diff dz proximal 20%, 30-40% dz mid vessel prior 2nd diagonal, LCx: 40% in OM1, 20-30% in OM2, RCA: 99% subtotal occlusion @ crux, TIMI 1 flow, L-R collaterals to distal vessel  ? Cardiomyopathy ([place])  03/28/2014  ? LV dysfunction out of proportion to CAD 03/28/14  ? CHF (congestive heart failure) ([place])   ? Phreesia 10/03/2020  ? Chronic systolic heart failure ([place])   ? a. ECHO (03/2014): EF 25-30%, akinesis enteroanteroseptal myocardium, grade III DD b. RHC (03/2014) RA 4, RV 42/4, PA 44/14 (26), PCWP 21, PA 62% Fick CO/CI 6.9 / 3.4  ? Diabetes mellitus   ? Diabetes mellitus without complication ([place])   ? Phreesia 10/03/2020  ? Duodenal ulcer   ? Age 46  ? Dyspnea   ? Erosive esophagitis   ? Gastritis   ? Hypertension   ? Hypothyroidism   ? Obese   ? Short-term memory loss   ? Thyroid disease   ? TTP (thrombotic thrombocytopenic purpura) ([place]) 06/2005  ? ?Past Surgical History:  ?Procedure Laterality Date  ? BIV ICD INSERTION CRT-D N/A 05/03/2019  ? Procedure: BIV ICD INSERTION CRT-D;  Surgeon: Thompson Grayer, MD;  Location: [facility];  Service: Cardiovascular;  Laterality: N/A;  ? CARDIAC CATHETERIZATION    ? COLONOSCOPY  08/29/2012  ? [id number]:[id number] diverticulosis  ? COLONOSCOPY N/A 05/28/2015  ? Procedure: COLONOSCOPY;  Surgeon: Daneil Dolin, MD;  Location: [facility];  Service: Endoscopy;  Laterality: N/A;  1015  ? ECTOPIC PREGNANCY SURGERY    ? ESOPHAGOGASTRODUODENOSCOPY  04/20/2012  ? WGN:[id number] antral gastritis/Erosive reflux esophagitis  ? LEFT AND RIGHT HEART CATHETERIZATION WITH CORONARY ANGIOGRAM N/A  03/28/2014  ? Procedure: LEFT AND RIGHT HEART CATHETERIZATION WITH CORONARY ANGIOGRAM;  Surgeon: Peter M Martinique, MD;  Location: [facility];  Service: Cardiovascular;  Laterality: N/A;  ? SPLENECTOMY  07/20/2005  ? ? ? ? ?Allergies  ?Allergen Reactions  ? Other Other (See Comments)  ?  FLAGYL, CIPRO, PROTONIX taken at the same time caused patient to lose her breath and have trouble breathing, requiring hospital stay at [facility], 03/23/14-per patient.   ? Lisinopril Cough  ? ? ?Social History  ? ?Socioeconomic History  ? Marital status: Divorced  ?  Spouse name: Not on file  ? Number of children: 3  ? Years  of education: 8  ? Highest education level: Some college, no degree  ?Occupational History  ? Occupation: disabled  ?  Employer: NOT EMPLOYED  ?Tobacco Use  ? Smoking status: Former  ?  Packs/day: 0.25  ?  Years: 10.00  ?  Pack years: 2.50  ?  Types: Cigarettes  ?  Start date: 09/20/1966  ?  Quit date: 09/20/1978  ?  Years since quitting: 43.3  ? Smokeless tobacco: Never  ?Vaping Use  ? Vaping Use: Never used  ?Substance and Sexual Activity  ? Alcohol use: No  ?  Alcohol/week: 0.0 standard drinks  ? Drug use: No  ? Sexual activity: Not Currently  ?Other Topics Concern  ? Not on file  ?Social History Narrative  ? Lives Alone   ?   ? ?Social Determinants of Health  ? ?Financial Resource Strain: Low Risk   ? Difficulty of Paying Living Expenses: Not very hard  ?Food Insecurity: Food Insecurity Present  ? Worried About Charity fundraiser in the Last Year: Sometimes true  ? Ran Out of Food in the Last Year: Sometimes true  ?Transportation Needs: No Transportation Needs  ? Lack of Transportation (Medical): No  ? Lack of Transportation (Non-Medical): No  ?Physical Activity: Insufficiently Active  ? Days of Exercise per Week: 2 days  ? Minutes of Exercise per Session: 20 min  ?Stress: No Stress Concern Present  ? Feeling of Stress : Not at all  ?Social Connections: Moderately Integrated  ? Frequency of Communication with Friends and Family: More than three times a week  ? Frequency of Social Gatherings with Friends and Family: Twice a week  ? Attends Religious Services: More than 4 times per year  ? Active Member of Clubs or Organizations: Yes  ? Attends Archivist Meetings: More than 4 times per year  ? Marital Status: Divorced  ?Intimate Partner Violence: Not At Risk  ? Fear of Current or Ex-Partner: No  ? Emotionally Abused: No  ? Physically Abused: No  ? Sexually Abused: No  ? ? ? ?Family History  ?Problem Relation Age of Onset  ? Heart failure Mother   ? Cirrhosis Father   ?     ETOH  ? Heart disease Brother    ? Diabetes Daughter   ? Breast cancer Maternal Aunt   ? Colon cancer Neg Hx   ? ? ? ?Review of Systems ?Constitutional: negative for anorexia, fevers and sweats  ?Eyes: negative for irritation, redness and visual disturbance  ?Ears, nose, mouth, throat, and face: negative for earaches, epistaxis, nasal congestion and sore throat  ?Respiratory: negative for cough, dyspnea on exertion, sputum and wheezing  ?Cardiovascular: negative for chest pain, dyspnea, lower extremity edema, orthopnea, palpitations and syncope  ?Gastrointestinal: negative for abdominal pain, constipation, diarrhea, melena, nausea and vomiting  ?Genitourinary:negative for  dysuria, frequency and hematuria  ?Hematologic/lymphatic: negative for bleeding, easy bruising and lymphadenopathy  ?Musculoskeletal:negative for arthralgias, muscle weakness and stiff joints  ?Neurological: negative for coordination problems, gait problems, headaches and weakness  ?Endocrine: negative for diabetic symptoms including polydipsia, polyuria and weight loss ? ?   ?Objective:  ? Physical Exam ? ?Gen. Pleasant, obese, in no distress, normal affect ?ENT - no pallor,icterus, no post nasal drip, class 2-3 airway ?Neck: No JVD, no thyromegaly, no carotid bruits ?Lungs: no use of accessory muscles, no dullness to percussion, decreased without rales or rhonchi  ?Cardiovascular: Rhythm regular, heart sounds  normal, no murmurs or gallops, no peripheral edema ?Abdomen: soft and non-tender, no hepatosplenomegaly, BS normal. ?Musculoskeletal: No deformities, no cyanosis or clubbing ?Neuro:  alert, non focal, no tremors ? ? ? ?   ?Assessment & Plan:  ? ?Cardiology and vascular consultation in her previous sleep studies and PCP report ? ?

## 2022-01-22 NOTE — Progress Notes (Signed)
EPIC Encounter for ICM Monitoring ? ?Patient Name: [name] is a 71 y.o. female ?Date: 01/22/2022 ?Primary Care Physican: Lindell Spar, MD ?Primary Cardiologist: Harl Bowie ?Electrophysiologist: Allred ?Bi-V Pacing:  98%        ?01/22/2022 Weight: 223 lbs ?                                                        ?  ?Spoke with patient and heart failure questions reviewed.  She is doing well and voices no complaints. ?  ?Corvue Thoracic impedance suggesting normal fluid levels. ?  ?Prescribed:  ?Furosemide 20 mg take 1 tablet by mouth every other day.   ?Spironolactone 25 mg take 1 tablet daily. ?  ?Labs: ?09/25/2021 Creatinine 0.94, BUN 13, Potassium 4.6, Sodium 140, GFR 64 ?09/09/2021 Creatinine 1.01, BUN 17, Potassium 4.6, Sodium 139, GFR 59  ?08/26/2021 Creatinine 1.42, BUN 26, Potassium 5.0, Sodium 138, GFR 39  ?A complete set of results can be found in Results Review. ?  ?Recommendations:  No changes and encouraged to call if experiencing any fluid symptoms. ?  ?Follow-up plan: ICM clinic phone appointment on 02/22/2021.    91 day device clinic remote transmission 02/08/2022.      ?  ?EP/Cardiology Office Visits:   05/17/2022 with Dr Harl Bowie.  03/24/2022 with Oda Kilts, PA.  ?  ?Copy of ICM check sent to Dr. Rayann Heman.  ? ?3 month ICM trend: 01/18/2022. ? ? ? ?12-14 Month ICM trend:  ? ? ? ?[name], RN ?01/22/2022 ?5:08 PM ? ?

## 2022-01-22 NOTE — Assessment & Plan Note (Signed)
Compliant with diuretic and fluid status seems to be controlled ?Close relation between OSA and heart failure was discussed ?

## 2022-01-22 NOTE — Patient Instructions (Signed)
?  Call [employer] & check on the status of your replacement BiPAP ?Settings should be 15/11 ? ?You have severe OSA & this affects your heart ?

## 2022-01-22 NOTE — Assessment & Plan Note (Signed)
She has severe OSA which was not corrected by CPAP during a titration study but corrected by BiPAP even though she had some few residual central apneas.  She apparently returned her machine during the recall and has not received a replacement yet. ?I have asked her to contact [employer] since she has the paperwork and see if they can expedite providing her with a replacement BiPAP.  We will ensure that settings are at 15/11 cm.  I will asked her to get back on the BiPAP for some reason if she is unable to obtain it from the company then we can write her prescription for replacement. ? ?Weight loss encouraged, compliance with goal of at least 4-6 hrs every night is the expectation. ?Advised against medications with sedative side effects ?Cautioned against driving when sleepy - understanding that sleepiness will vary on a day to day basis ? ?

## 2022-01-27 ENCOUNTER — Other Ambulatory Visit: Payer: Self-pay | Admitting: Internal Medicine

## 2022-02-01 ENCOUNTER — Ambulatory Visit (HOSPITAL_COMMUNITY)
Admission: RE | Admit: 2022-02-01 | Discharge: 2022-02-01 | Disposition: A | Discharge status: Home / Self Care | Payer: Medicare HMO | Source: Ambulatory Visit | Attending: Student | Admitting: Student

## 2022-02-01 DIAGNOSIS — I5022 Chronic systolic (congestive) heart failure: Secondary | ICD-10-CM | POA: Insufficient documentation

## 2022-02-01 LAB — ECHOCARDIOGRAM COMPLETE
AR max vel: 1.4 cm2
AV Area VTI: 1.46 cm2
AV Area mean vel: 1.25 cm2
AV Mean grad: 5 mmHg
AV Peak grad: 10 mmHg
Ao pk vel: 1.58 m/s
Area-P 1/2: 4.04 cm2
Calc EF: 36 %
MV VTI: 2.45 cm2
S' Lateral: 4.4 cm
Single Plane A2C EF: 33.7 %
Single Plane A4C EF: 36.3 %

## 2022-02-01 NOTE — Progress Notes (Signed)
*  PRELIMINARY RESULTS* ?Echocardiogram ?2D Echocardiogram has been performed. ? ?[name] ?02/01/2022, 10:34 AM ?

## 2022-02-04 ENCOUNTER — Other Ambulatory Visit: Payer: Self-pay | Admitting: Family Medicine

## 2022-02-08 ENCOUNTER — Ambulatory Visit (INDEPENDENT_AMBULATORY_CARE_PROVIDER_SITE_OTHER): Payer: Medicare HMO

## 2022-02-08 DIAGNOSIS — I5022 Chronic systolic (congestive) heart failure: Secondary | ICD-10-CM

## 2022-02-08 DIAGNOSIS — I255 Ischemic cardiomyopathy: Secondary | ICD-10-CM

## 2022-02-09 LAB — CUP PACEART REMOTE DEVICE CHECK
Battery Remaining Longevity: 35 mo
Battery Remaining Percentage: 53 %
Battery Voltage: 2.95 V
Brady Statistic AP VP Percent: 1 %
Brady Statistic AP VS Percent: 1 %
Brady Statistic AS VP Percent: 98 %
Brady Statistic AS VS Percent: 1 %
Brady Statistic RA Percent Paced: 1 %
Date Time Interrogation Session: 20230522020601
HighPow Impedance: 63 Ohm
HighPow Impedance: 63 Ohm
Implantable Lead Implant Date: 20200813
Implantable Lead Implant Date: 20200813
Implantable Lead Implant Date: 20200813
Implantable Lead Location: 753858
Implantable Lead Location: 753859
Implantable Lead Location: 753860
Implantable Pulse Generator Implant Date: 20200813
Lead Channel Impedance Value: 390 Ohm
Lead Channel Impedance Value: 400 Ohm
Lead Channel Impedance Value: 400 Ohm
Lead Channel Pacing Threshold Amplitude: 0.5 V
Lead Channel Pacing Threshold Amplitude: 0.625 V
Lead Channel Pacing Threshold Amplitude: 1.875 V
Lead Channel Pacing Threshold Pulse Width: 0.4 ms
Lead Channel Pacing Threshold Pulse Width: 0.5 ms
Lead Channel Pacing Threshold Pulse Width: 0.6 ms
Lead Channel Sensing Intrinsic Amplitude: 11.8 mV
Lead Channel Sensing Intrinsic Amplitude: 5 mV
Lead Channel Setting Pacing Amplitude: 2 V
Lead Channel Setting Pacing Amplitude: 2 V
Lead Channel Setting Pacing Amplitude: 2.875
Lead Channel Setting Pacing Pulse Width: 0.5 ms
Lead Channel Setting Pacing Pulse Width: 0.6 ms
Lead Channel Setting Sensing Sensitivity: 0.5 mV
Pulse Gen Serial Number: 9891600

## 2022-02-22 ENCOUNTER — Ambulatory Visit (INDEPENDENT_AMBULATORY_CARE_PROVIDER_SITE_OTHER): Payer: Medicare HMO

## 2022-02-22 DIAGNOSIS — Z95810 Presence of automatic (implantable) cardiac defibrillator: Secondary | ICD-10-CM | POA: Diagnosis not present

## 2022-02-22 DIAGNOSIS — I5022 Chronic systolic (congestive) heart failure: Secondary | ICD-10-CM

## 2022-02-24 NOTE — Progress Notes (Signed)
Remote ICD transmission.   

## 2022-02-25 NOTE — Progress Notes (Signed)
EPIC Encounter for ICM Monitoring  Patient Name: [name] is a 71 y.o. female Date: 02/25/2022 Primary Care Physican: Lindell Spar, MD Primary Cardiologist: Branch Electrophysiologist: Allred Bi-V Pacing:  98%        01/22/2022 Weight: 223 lbs 02/25/2022 Weight: 223 lbs                                                           Spoke with patient and heart failure questions reviewed.  She is doing well and voices no complaints.   Corvue Thoracic impedance suggesting normal fluid levels.   Prescribed:  Furosemide 20 mg take 1 tablet by mouth every other day.   Spironolactone 25 mg take 1 tablet daily.   Labs: 09/25/2021 Creatinine 0.94, BUN 13, Potassium 4.6, Sodium 140, GFR 64 09/09/2021 Creatinine 1.01, BUN 17, Potassium 4.6, Sodium 139, GFR 59  08/26/2021 Creatinine 1.42, BUN 26, Potassium 5.0, Sodium 138, GFR 39  A complete set of results can be found in Results Review.   Recommendations:  No changes and encouraged to call if experiencing any fluid symptoms.   Follow-up plan: ICM clinic phone appointment on 03/29/2021.    91 day device clinic remote transmission 05/10/2022.        EP/Cardiology Office Visits:   05/17/2022 with Dr Harl Bowie.  Recall 03/24/2022 with Oda Kilts, PA.    Copy of ICM check sent to Dr. Rayann Heman.   3 month ICM trend: 02/22/2022.    12-14 Month ICM trend:     [name], RN 02/25/2022 1:43 PM

## 2022-03-29 ENCOUNTER — Ambulatory Visit (INDEPENDENT_AMBULATORY_CARE_PROVIDER_SITE_OTHER): Payer: Medicare HMO

## 2022-03-29 DIAGNOSIS — Z95810 Presence of automatic (implantable) cardiac defibrillator: Secondary | ICD-10-CM

## 2022-03-29 DIAGNOSIS — I5022 Chronic systolic (congestive) heart failure: Secondary | ICD-10-CM | POA: Diagnosis not present

## 2022-04-01 NOTE — Progress Notes (Signed)
EPIC Encounter for ICM Monitoring  Patient Name: [name] is a 71 y.o. female Date: 04/01/2022 Primary Care Physican: Lindell Spar, MD Primary Cardiologist: Branch Electrophysiologist: Allred Bi-V Pacing:  99%        01/22/2022 Weight: 223 lbs 04/01/2022 Weight: 223 lbs                                                           Spoke with patient and heart failure questions reviewed.  She is doing well and voices no complaints.  Her daughter had a stroke and in rehab.     Corvue Thoracic impedance suggesting normal fluid levels.   Prescribed:  Furosemide 20 mg take 1 tablet by mouth every other day.   Spironolactone 25 mg take 1 tablet daily.   Labs: 09/25/2021 Creatinine 0.94, BUN 13, Potassium 4.6, Sodium 140, GFR 64 09/09/2021 Creatinine 1.01, BUN 17, Potassium 4.6, Sodium 139, GFR 59  08/26/2021 Creatinine 1.42, BUN 26, Potassium 5.0, Sodium 138, GFR 39  A complete set of results can be found in Results Review.   Recommendations:  No changes and encouraged to call if experiencing any fluid symptoms.   Follow-up plan: ICM clinic phone appointment on 05/03/2021.    91 day device clinic remote transmission 05/10/2022.        EP/Cardiology Office Visits:   05/17/2022 with Dr Harl Bowie.  Recall 03/24/2022 with Oda Kilts, PA.    Copy of ICM check sent to Dr. Rayann Heman.   3 month ICM trend: 03/29/2022.    12-14 Month ICM trend:     [name], RN 04/01/2022 2:36 PM

## 2022-04-09 ENCOUNTER — Other Ambulatory Visit: Payer: Self-pay | Admitting: Internal Medicine

## 2022-04-15 ENCOUNTER — Other Ambulatory Visit: Payer: Self-pay | Admitting: Internal Medicine

## 2022-04-27 NOTE — Progress Notes (Signed)
Electrophysiology Office Note Date: 05/04/2022  ID:  [name], DOB 1951/07/25, MRN [id number]  PCP: Lindell Spar, MD Primary Cardiologist: [name], MD Electrophysiologist: Thompson Grayer, MD   CC: Routine ICD follow-up  [name] is a 71 y.o. female seen today for Thompson Grayer, MD for routine electrophysiology followup.  Barostim implant 07/22/2021.   Previously, Barostim device titrated from 5.0 ma at 125 ms to 8.0 ma to 65 ms (equivalent to ~ 6.0 ma _0 )  She reported intermittent stim as a buzzing at 5.0 ma @ 125 ms, so was decreased to 65 ms.    Had recurrence of stim sensation at 8.8 ma @ 65 ms and 8.4  Device currently programmed at 10.0 ma to 45 ms  Since last being seen in our clinic doing very well overall. She feels she can do nearly everything she wants to do without issue.  She still has an intermittent "tingling" in her neck. 1-2 times a week. This is not significant enough that she wishes to down-titrate her barostim. Denies any undue SOB, syncope, or edema.   Device History: Barostim Optometrist) implant 07/22/2021 [employer] CRT-D implanted 04/2019 for chronic systolic CHF History of appropriate therapy: No History of AAD therapy: No  Past Medical History:  Diagnosis Date   Anemia    Arthritis    CAD (coronary artery disease)    a. LHC (03/2014) [place]: nl, LAD: diff dz proximal 20%, 30-40% dz mid vessel prior 2nd diagonal, LCx: 40% in OM1, 20-30% in OM2, RCA: 99% subtotal occlusion @ crux, TIMI 1 flow, L-R collaterals to distal vessel   Cardiomyopathy ([place]) 03/28/2014   LV dysfunction out of proportion to CAD 03/28/14   CHF (congestive heart failure) ([place])    Phreesia 17/40/8144   Chronic systolic heart failure ([place])    a. ECHO (03/2014): EF 25-30%, akinesis enteroanteroseptal myocardium, grade III DD b. RHC (03/2014) RA 4, RV 42/4, PA 44/14 (26), PCWP 21, PA 62% Fick CO/CI 6.9 / 3.4   Diabetes mellitus    Diabetes mellitus without  complication ([place])    Phreesia 10/03/2020   Duodenal ulcer    Age 14   Dyspnea    Erosive esophagitis    Gastritis    Hypertension    Hypothyroidism    Obese    Short-term memory loss    Thyroid disease    TTP (thrombotic thrombocytopenic purpura) ([place]) 06/2005   Past Surgical History:  Procedure Laterality Date   BIV ICD INSERTION CRT-D N/A 05/03/2019   Procedure: BIV ICD INSERTION CRT-D;  Surgeon: Thompson Grayer, MD;  Location: [facility];  Service: Cardiovascular;  Laterality: N/A;   CARDIAC CATHETERIZATION     COLONOSCOPY  08/29/2012   [id number] diverticulosis   COLONOSCOPY N/A 05/28/2015   Procedure: COLONOSCOPY;  Surgeon: Daneil Dolin, MD;  Location: [facility];  Service: Endoscopy;  Laterality: N/A;  1015   ECTOPIC PREGNANCY SURGERY     ESOPHAGOGASTRODUODENOSCOPY  04/20/2012   [id number] antral gastritis/Erosive reflux esophagitis   LEFT AND RIGHT HEART CATHETERIZATION WITH CORONARY ANGIOGRAM N/A 03/28/2014   Procedure: LEFT AND RIGHT HEART CATHETERIZATION WITH CORONARY ANGIOGRAM;  Surgeon: Peter M Martinique, MD;  Location[facility] C[facility];  Service: Cardiovascular;  Laterality: N/A;   SPLENECTOMY  07/20/2005    Current Outpatient Medications  Medication Sig Dispense Refill   acetaminophen (TYLENOL) 500 MG tablet Take 1,000 mg by mouth every 6 (six) hours as needed for moderate pain.  aspirin 81 MG chewable tablet Chew 1 tablet (81 mg total) by mouth daily.     atorvastatin (LIPITOR) 80 MG tablet TAKE 1 TABLET EVERY DAY 90 tablet 2   blood glucose meter kit and supplies Dispense based on patient and insurance preference. Use up to four times daily as directed. (FOR ICD-10 E10.9, E11.9). 1 each 0   blood glucose meter kit and supplies Dispense based on patient and insurance preference. Use up to four times daily as directed. (FOR ICD-10 E10.9, E11.9). 1 each 0   carvedilol (COREG) 25 MG tablet TAKE 1 TABLET TWICE DAILY (Patient taking differently: Take 25 mg by  mouth 2 (two) times daily with a meal.) 180 tablet 3   Ciclopirox 0.77 % gel APPLY ONE TIME DAILY ON THE AFFECTED TOE NAIL (Patient taking differently: Apply 1 application  topically 2 (two) times daily as needed (affected toe nail). APPLY ONE TIME DAILY ON THE AFFECTED TOE NAIL.) 30 g 0   empagliflozin (JARDIANCE) 10 MG TABS tablet Take 1 tablet (10 mg total) by mouth daily before breakfast. 14 tablet 0   furosemide (LASIX) 20 MG tablet Take 1 tablet (20 mg total) by mouth every other day. 45 tablet 3   insulin aspart (NOVOLOG) 100 UNIT/ML injection Inject 6 Units into the skin 3 (three) times daily before meals. (Patient taking differently: Inject 5 Units into the skin 3 (three) times daily before meals.)     LANTUS 100 UNIT/ML injection INJECT 40 UNITS SUBCUTANEOUSLY TWICE DAILY 70 mL 1   levothyroxine (SYNTHROID) 50 MCG tablet Take 1 tablet (50 mcg total) by mouth daily before breakfast. 90 tablet 1   losartan (COZAAR) 25 MG tablet Take 1 tablet (25 mg total) by mouth daily. 90 tablet 3   Multiple Vitamin (MULTIVITAMIN) tablet Take 1 tablet by mouth daily.     polyethylene glycol (MIRALAX / GLYCOLAX) packet Take 17 g by mouth daily as needed for mild constipation.      spironolactone (ALDACTONE) 25 MG tablet TAKE 1 TABLET EVERY DAY 90 tablet 2   TRUE METRIX BLOOD GLUCOSE TEST test strip USE AS DIRECTED TO TEST BLOOD SUGAR UP TO FOUR TIMES DAILY 300 strip 1   TRUEplus Lancets 33G MISC USE AS DIRECTED TO CHECK BLOOD SUGAR UP TO FOUR TIMES DAILY 400 each 1   No current facility-administered medications for this visit.    Allergies:   Other and Lisinopril   Social History: Social History   Socioeconomic History   Marital status: Divorced    Spouse name: Not on file   Number of children: 3   Years of education: 12   Highest education level: Some college, no degree  Occupational History   Occupation: disabled    Fish farm manager: NOT EMPLOYED  Tobacco Use   Smoking status: Former    Packs/day:  0.25    Years: 10.00    Total pack years: 2.50    Types: Cigarettes    Start date: 09/20/1966    Quit date: 09/20/1978    Years since quitting: 43.6   Smokeless tobacco: Never  Vaping Use   Vaping Use: Never used  Substance and Sexual Activity   Alcohol use: No    Alcohol/week: 0.0 standard drinks of alcohol   Drug use: No   Sexual activity: Not Currently  Other Topics Concern   Not on file  Social History Narrative   Lives Alone       Social Determinants of Health   Financial Resource Strain: Low Risk  (  09/07/2021)   Overall Financial Resource Strain (CARDIA)    Difficulty of Paying Living Expenses: Not very hard  Food Insecurity: Food Insecurity Present (09/07/2021)   Hunger Vital Sign    Worried About Running Out of Food in the Last Year: Sometimes true    Ran Out of Food in the Last Year: Sometimes true  Transportation Needs: No Transportation Needs (09/07/2021)   PRAPARE - Hydrologist (Medical): No    Lack of Transportation (Non-Medical): No  Physical Activity: Insufficiently Active (09/07/2021)   Exercise Vital Sign    Days of Exercise per Week: 2 days    Minutes of Exercise per Session: 20 min  Stress: No Stress Concern Present (09/07/2021)   [place]    Feeling of Stress : Not at all  Social Connections: Moderately Integrated (09/07/2021)   Social Connection and Isolation Panel [NHANES]    Frequency of Communication with Friends and Family: More than three times a week    Frequency of Social Gatherings with Friends and Family: Twice a week    Attends Religious Services: More than 4 times per year    Active Member of [employer] or Organizations: Yes    Attends Music therapist: More than 4 times per year    Marital Status: Divorced  Intimate Partner Violence: Not At Risk (09/07/2021)   Humiliation, Afraid, Rape, and Kick questionnaire    Fear of Current or  Ex-Partner: No    Emotionally Abused: No    Physically Abused: No    Sexually Abused: No    Family History: Family History  Problem Relation Age of Onset   Heart failure Mother    Cirrhosis Father        ETOH   Heart disease Brother    Diabetes Daughter    Breast cancer Maternal Aunt    Colon cancer Neg Hx    Review of systems complete and found to be negative unless listed in HPI.    Physical Exam: Vitals:   05/04/22 1032  BP: 110/70  Pulse: 87  SpO2: 98%  Weight: 215 lb 3.2 oz (97.6 kg)  Height: _0  (1.651 m)    General: Pleasant, NAD. No resp difficulty Psych: Normal affect. HEENT:  Normal, without mass or lesion.         Neck: Supple, no bruits or JVD. Carotids 2+. No lymphadenopathy/thyromegaly appreciated. Heart: PMI nondisplaced. RRR no s3, s4, or murmurs. Lungs:  Resp regular and unlabored, CTA. Abdomen: Soft, non-tender, non-distended, No HSM, BS + x 4.   Extremities: No clubbing, cyanosis or edema. DP/PT/Radials 2+ and equal bilaterally. Neuro: Alert and oriented X 3. Moves all extremities spontaneously.   Barostim interrogation- Performed personally, see attached report.  ICD interrogation- Performed personally today, see [facility] report  EKG:  EKG is not ordered today  Recent Labs: 07/15/2021: ALT 39; Hemoglobin 11.0; Platelets 150 09/25/2021: BUN 13; NT-Pro BNP 751; Potassium 4.6; Sodium 140 11/30/2021: Creatinine, Ser 1.30   Wt Readings from Last 3 Encounters:  05/04/22 215 lb 3.2 oz (97.6 kg)  01/22/22 223 lb 6.4 oz (101.3 kg)  12/02/21 223 lb 6.4 oz (101.3 kg)     Other studies Reviewed: Additional studies/ records that were reviewed today include: Previous EP office notes.   Assessment and Plan:  1.  Chronic systolic dysfunction s/p St. Jude CRT-D  Volume status stable on exam. Stable on an appropriate medical regimen Normal ICD function  Echo  01/2022 showed improvement of EF to 40-45%. Great news No changes today Barostim device at 10.0  ma @ 45 ms for chronic settings. Rare stim, but wishes to leave at same.  Watch fluid status on jardiance, lasix, and spiro.  2. HTN Stable on current regimen   3. CAD Denies s/s ischemia  Current medicines are reviewed at length with the patient today.    Disposition:  RTC 6 month. She is now at chronic settings.   [name], PA-C  05/04/2022 11:31 AM  [facility] [address] [place] [address] [phone] (office) [phone] (fax)

## 2022-05-03 ENCOUNTER — Ambulatory Visit (INDEPENDENT_AMBULATORY_CARE_PROVIDER_SITE_OTHER): Payer: Medicare HMO

## 2022-05-03 DIAGNOSIS — I5022 Chronic systolic (congestive) heart failure: Secondary | ICD-10-CM

## 2022-05-03 DIAGNOSIS — Z95810 Presence of automatic (implantable) cardiac defibrillator: Secondary | ICD-10-CM

## 2022-05-04 ENCOUNTER — Encounter: Payer: Self-pay | Admitting: Student

## 2022-05-04 ENCOUNTER — Ambulatory Visit (INDEPENDENT_AMBULATORY_CARE_PROVIDER_SITE_OTHER): Payer: Medicare HMO | Admitting: Student

## 2022-05-04 ENCOUNTER — Encounter: Payer: Self-pay | Admitting: *Deleted

## 2022-05-04 ENCOUNTER — Encounter: Payer: Medicare HMO | Admitting: *Deleted

## 2022-05-04 VITALS — BP 110/70 | HR 87 | Ht 65.0 in | Wt 215.2 lb

## 2022-05-04 DIAGNOSIS — Z95810 Presence of automatic (implantable) cardiac defibrillator: Secondary | ICD-10-CM

## 2022-05-04 DIAGNOSIS — I5022 Chronic systolic (congestive) heart failure: Secondary | ICD-10-CM

## 2022-05-04 DIAGNOSIS — Z006 Encounter for examination for normal comparison and control in clinical research program: Secondary | ICD-10-CM

## 2022-05-04 DIAGNOSIS — I2510 Atherosclerotic heart disease of native coronary artery without angina pectoris: Secondary | ICD-10-CM | POA: Diagnosis not present

## 2022-05-04 DIAGNOSIS — I10 Essential (primary) hypertension: Secondary | ICD-10-CM

## 2022-05-04 LAB — CUP PACEART INCLINIC DEVICE CHECK
Battery Remaining Longevity: 39 mo
Brady Statistic RA Percent Paced: 0.16 %
Brady Statistic RV Percent Paced: 99 %
Date Time Interrogation Session: 20230815113039
HighPow Impedance: 64.125
Implantable Lead Implant Date: 20200813
Implantable Lead Implant Date: 20200813
Implantable Lead Implant Date: 20200813
Implantable Lead Location: 753858
Implantable Lead Location: 753859
Implantable Lead Location: 753860
Implantable Pulse Generator Implant Date: 20200813
Lead Channel Impedance Value: 400 Ohm
Lead Channel Impedance Value: 400 Ohm
Lead Channel Impedance Value: 437.5 Ohm
Lead Channel Pacing Threshold Amplitude: 0.5 V
Lead Channel Pacing Threshold Amplitude: 0.75 V
Lead Channel Pacing Threshold Amplitude: 0.75 V
Lead Channel Pacing Threshold Amplitude: 1.75 V
Lead Channel Pacing Threshold Amplitude: 1.75 V
Lead Channel Pacing Threshold Pulse Width: 0.4 ms
Lead Channel Pacing Threshold Pulse Width: 0.4 ms
Lead Channel Pacing Threshold Pulse Width: 0.5 ms
Lead Channel Pacing Threshold Pulse Width: 0.6 ms
Lead Channel Pacing Threshold Pulse Width: 0.6 ms
Lead Channel Sensing Intrinsic Amplitude: 11.8 mV
Lead Channel Sensing Intrinsic Amplitude: 5 mV
Lead Channel Setting Pacing Amplitude: 2 V
Lead Channel Setting Pacing Amplitude: 2 V
Lead Channel Setting Pacing Amplitude: 2.5 V
Lead Channel Setting Pacing Pulse Width: 0.5 ms
Lead Channel Setting Pacing Pulse Width: 0.6 ms
Lead Channel Setting Sensing Sensitivity: 0.5 mV
Pulse Gen Serial Number: 9891600

## 2022-05-04 NOTE — Research (Signed)
REBALANCE Informed Consent   Subject Name: [name]  Subject met inclusion and exclusion criteria.  The informed consent form, study requirements and expectations were reviewed with the subject and questions and concerns were addressed prior to the signing of the consent form.  The subject verbalized understanding of the trial requirements.  The subject agreed to participate in the REBALANCE  trial and signed the informed consent  on 05-04-2022.  The informed consent was obtained prior to performance of any protocol-specific procedures for the subject.  A copy of the signed informed consent was given to the subject and a copy was placed in the subject's medical record.      Kenya Chalmers,Research Coordinator 05/04/2022   10:45 a.m.   

## 2022-05-04 NOTE — Patient Instructions (Signed)
Medication Instructions:  Your physician recommends that you continue on your current medications as directed. Please refer to the Current Medication list given to you today.  *If you need a refill on your cardiac medications before your next appointment, please call your pharmacy*   Lab Work: TODAY: BMET, ProBNP  If you have labs (blood work) drawn today and your tests are completely normal, you will receive your results only by: [facility] (if you have MyChart) OR A paper copy in the mail If you have any lab test that is abnormal or we need to change your treatment, we will call you to review the results.   Follow-Up: At [facility], you and your health needs are our priority.  As part of our continuing mission to provide you with exceptional heart care, we have created designated Provider Care Teams.  These Care Teams include your primary Cardiologist (physician) and Advanced Practice Providers (APPs -  Physician Assistants and Nurse Practitioners) who all work together to provide you with the care you need, when you need it.  Your next appointment:   6 month(s)  The format for your next appointment:   In Person  Provider:   Dr. Myles Gip {

## 2022-05-05 LAB — BASIC METABOLIC PANEL
BUN/Creatinine Ratio: 25 (ref 12–28)
BUN: 27 mg/dL (ref 8–27)
CO2: 21 mmol/L (ref 20–29)
Calcium: 9.3 mg/dL (ref 8.7–10.3)
Chloride: 104 mmol/L (ref 96–106)
Creatinine, Ser: 1.06 mg/dL — ABNORMAL HIGH (ref 0.57–1.00)
Glucose: 99 mg/dL (ref 70–99)
Potassium: 5 mmol/L (ref 3.5–5.2)
Sodium: 139 mmol/L (ref 134–144)
eGFR: 55 mL/min/{1.73_m2} — ABNORMAL LOW (ref >59)

## 2022-05-05 LAB — PRO B NATRIURETIC PEPTIDE: NT-Pro BNP: 208 pg/mL (ref 0–301)

## 2022-05-06 NOTE — Progress Notes (Signed)
EPIC Encounter for ICM Monitoring  Patient Name: [name] is a 71 y.o. female Date: 05/06/2022 Primary Care Physican: Lindell Spar, MD Primary Cardiologist: Branch Electrophysiologist: Allred Bi-V Pacing:  99%        01/22/2022 Weight: 223 lbs 04/01/2022 Weight: 223 lbs 05/06/2022 Weight: 221 lbs                                                          Spoke with patient and heart failure questions reviewed.  She reports feeling tired and dizzy for the last few days  Her daughter had a stroke and in rehab.     Corvue Thoracic impedance suggesting possible dryness starting 8/11.  Impedance suggesting possible fluid accumulation from 7/24-8/4.   Prescribed:  Furosemide 20 mg take 1 tablet by mouth every other day.   Spironolactone 25 mg take 1 tablet daily. Jardiance 10 mg take 1 tablet by mouth before breakfast   Labs: 05/04/2022 Creatinine 1.06, BUN 27, Potassium 5.0, Sodium 139, GFR 55 09/25/2021 Creatinine 0.94, BUN 13, Potassium 4.6, Sodium 140, GFR 64 A complete set of results can be found in Results Review.   Recommendations:  No changes and encouraged to call if experiencing any fluid symptoms.   Follow-up plan: ICM clinic phone appointment on 05/17/2022 to recheck fluid levels.    91 day device clinic remote transmission 05/10/2022.        EP/Cardiology Office Visits:   05/17/2022 with Dr Harl Bowie.  Recall 10/31/2022 with Dr Rayann Heman.    Copy of ICM check sent to Dr. Rayann Heman.   3 month ICM trend: 05/03/2022.    12-14 Month ICM trend:     [name], RN 05/06/2022 10:53 AM

## 2022-05-10 ENCOUNTER — Ambulatory Visit: Payer: Medicare HMO | Admitting: Pulmonary Disease

## 2022-05-10 ENCOUNTER — Ambulatory Visit (INDEPENDENT_AMBULATORY_CARE_PROVIDER_SITE_OTHER): Payer: Medicare HMO

## 2022-05-10 DIAGNOSIS — I5022 Chronic systolic (congestive) heart failure: Secondary | ICD-10-CM | POA: Diagnosis not present

## 2022-05-11 LAB — CUP PACEART REMOTE DEVICE CHECK
Battery Remaining Longevity: 38 mo
Battery Remaining Percentage: 49 %
Battery Voltage: 2.93 V
Brady Statistic AP VP Percent: 1 %
Brady Statistic AP VS Percent: 0 %
Brady Statistic AS VP Percent: 98 %
Brady Statistic AS VS Percent: 1 %
Brady Statistic RA Percent Paced: 1 %
Date Time Interrogation Session: 20230821020034
HighPow Impedance: 64 Ohm
HighPow Impedance: 64 Ohm
Implantable Lead Implant Date: 20200813
Implantable Lead Implant Date: 20200813
Implantable Lead Implant Date: 20200813
Implantable Lead Location: 753858
Implantable Lead Location: 753859
Implantable Lead Location: 753860
Implantable Pulse Generator Implant Date: 20200813
Lead Channel Impedance Value: 340 Ohm
Lead Channel Impedance Value: 400 Ohm
Lead Channel Impedance Value: 400 Ohm
Lead Channel Pacing Threshold Amplitude: 0.625 V
Lead Channel Pacing Threshold Amplitude: 0.75 V
Lead Channel Pacing Threshold Amplitude: 1.75 V
Lead Channel Pacing Threshold Pulse Width: 0.4 ms
Lead Channel Pacing Threshold Pulse Width: 0.5 ms
Lead Channel Pacing Threshold Pulse Width: 0.6 ms
Lead Channel Sensing Intrinsic Amplitude: 12 mV
Lead Channel Sensing Intrinsic Amplitude: 5 mV
Lead Channel Setting Pacing Amplitude: 2 V
Lead Channel Setting Pacing Amplitude: 2 V
Lead Channel Setting Pacing Amplitude: 2.5 V
Lead Channel Setting Pacing Pulse Width: 0.5 ms
Lead Channel Setting Pacing Pulse Width: 0.6 ms
Lead Channel Setting Sensing Sensitivity: 0.5 mV
Pulse Gen Serial Number: 9891600

## 2022-05-17 ENCOUNTER — Encounter: Payer: Self-pay | Admitting: Cardiology

## 2022-05-17 ENCOUNTER — Ambulatory Visit: Payer: Medicare HMO | Attending: Cardiology | Admitting: Cardiology

## 2022-05-17 ENCOUNTER — Ambulatory Visit (INDEPENDENT_AMBULATORY_CARE_PROVIDER_SITE_OTHER): Payer: Medicare HMO

## 2022-05-17 VITALS — BP 96/58 | HR 79 | Ht 65.0 in | Wt 216.2 lb

## 2022-05-17 DIAGNOSIS — I2510 Atherosclerotic heart disease of native coronary artery without angina pectoris: Secondary | ICD-10-CM

## 2022-05-17 DIAGNOSIS — I5022 Chronic systolic (congestive) heart failure: Secondary | ICD-10-CM | POA: Diagnosis not present

## 2022-05-17 DIAGNOSIS — Z95810 Presence of automatic (implantable) cardiac defibrillator: Secondary | ICD-10-CM

## 2022-05-17 MED ORDER — CARVEDILOL 12.5 MG PO TABS: 12.5000 mg | ORAL_TABLET | Freq: Two times a day (BID) | ORAL | 3 refills | Status: DC

## 2022-05-17 NOTE — Progress Notes (Signed)
EPIC Encounter for ICM Monitoring  Patient Name: [name] is a 71 y.o. female Date: 05/17/2022 Primary Care Physican: Lindell Spar, MD Primary Cardiologist: Branch Electrophysiologist: Mealor Bi-V Pacing:  99%        01/22/2022 Weight: 223 lbs 04/01/2022 Weight: 223 lbs 05/06/2022 Weight: 221 lbs 05/17/2022 Weight: 216 lbs                                                          Spoke with patient and heart failure questions reviewed.  Pt asymptomatic for fluid accumulation.  She ate out of the weekend which would had more salt contributing to decreased impedance.    Corvue Thoracic impedance suggesting possible fluid accumulation starting 8/26.   Prescribed:  Furosemide 20 mg take 1 tablet by mouth every other day.   Spironolactone 25 mg take 1 tablet daily. Jardiance 10 mg take 1 tablet by mouth before breakfast   Labs: 05/04/2022 Creatinine 1.06, BUN 27, Potassium 5.0, Sodium 139, GFR 55 09/25/2021 Creatinine 0.94, BUN 13, Potassium 4.6, Sodium 140, GFR 64 A complete set of results can be found in Results Review.   Recommendations:  Recommendation to limit salt intake to 2000 mg daily.  Encouraged to call if experiencing any fluid symptoms.    Follow-up plan: ICM clinic phone appointment on 05/25/2022 to recheck fluid levels.    91 day device clinic remote transmission 08/09/2022.        EP/Cardiology Office Visits:   11/30/2022 with Dr Harl Bowie.     Copy of ICM check sent to Dr. Myles Gip (replacing Dr Rayann Heman)   3 month ICM trend: 05/17/2022.    12-14 Month ICM trend:     [name], RN 05/17/2022 1:07 PM

## 2022-05-17 NOTE — Patient Instructions (Signed)
Medication Instructions:  Decrease Coreg to 12.'5mg'$  twice a day   Continue all other medications.     Labwork: none  Testing/Procedures: none  Follow-Up: 6 months   Any Other Special Instructions Will Be Listed Below (If Applicable).   If you need a refill on your cardiac medications before your next appointment, please call your pharmacy.

## 2022-05-17 NOTE — Progress Notes (Signed)
Clinical Summary [name] is a 71 y.o.female seen today for follow up of the following meidcal problems.    1. Chronic combined systolic and diastolic heart failure - echo 03/2014 LVEF 25-30%, restrictive diastolic dysfunction. New diagnosis at that time. Repeat echo 06/2014 LVEF 40-45% - cath 03/2014 showed RCA 99% with left to right collaterals, other arteries patent. Overall LV systolic dysfunction out of proportion to CAD.  Medically managed.  - 07/2015 echo LVEF 11%, grade I diastolic dysfunction   - Entresto causes dizziness and nausea, stopped taking. Back on losartan   -  due to low bp's we lowered losartan to 61m daily. Lowered lasix to 279mdaily, may take 4023ms needed.    01/2018 echo LVEF 35%, grade I diastoilc dysfunction  - 11/2018 echo LVEF 30-35% - 05/03/19 had BIV AICD placed by Dr AllRayann Heman3/2021 LVEF 30-35% 07/2021 barostim implant  01/2022 echo: LVEF 40-45%, grade I dd      - some tiredness, fatigue, occasional dizziness - down 20 lbs since last year.  - reports breathing is doing well.    2. CAD - cath 03/2014  with 99% chronic RCA disease, otherwise patent vessels. Managed medically - no chest pains   3. OSA  - on bipap at night. Followed by Dr AlvElsworth Soho4. Hyperlipidemia -upcoming labs next month 02/2020 TC 76 HDL 33 TG 75 LDL 27 - labs followed by pcp   5. Chronic LBBB     6. Leg pains - back of thighs and calves with walking, resolves with rest - ABIs were normal but monophasic waveforms suggesting disease. Followed by vascualr with plans for CTA     Past Medical History:  Diagnosis Date   Anemia    Arthritis    CAD (coronary artery disease)    a. LHC (03/2014) L[place]: nl, LAD: diff dz proximal 20%, 30-40% dz mid vessel prior 2nd diagonal, LCx: 40% in OM1, 20-30% in OM2, RCA: 99% subtotal occlusion @ crux, TIMI 1 flow, L-R collaterals to distal vessel   Cardiomyopathy ([facility]CC[place]7/05/2014   LV dysfunction out of proportion to CAD 03/28/14   CHF  (congestive heart failure) ([place]  Phreesia 10/04/50/0802Chronic systolic heart failure (HCC[place]  a. ECHO (03/2014): EF 25-30%, akinesis enteroanteroseptal myocardium, grade III DD b. RHC (03/2014) RA 4, RV 42/4, PA 44/14 (26), PCWP 21, PA 62% Fick CO/CI 6.9 / 3.4   Diabetes mellitus    Diabetes mellitus without complication ([facility]  Phreesia 10/03/2020   Duodenal ulcer    Age 19   Dyspnea    Erosive esophagitis    Gastritis    Hypertension    Hypothyroidism    Obese    Short-term memory loss    Thyroid disease    TTP (thrombotic thrombocytopenic purpura) (HCCKers[place]0/2006     Allergies  Allergen Reactions   Other Other (See Comments)    FLAGYL, CIPRO, PROTONIX taken at the same time caused patient to lose her breath and have trouble breathing, requiring hospital stay at [facility]/4/15-per patient.    Lisinopril Cough     Current Outpatient Medications  Medication Sig Dispense Refill   acetaminophen (TYLENOL) 500 MG tablet Take 1,000 mg by mouth every 6 (six) hours as needed for moderate pain.     aspirin 81 MG chewable tablet Chew 1 tablet (81 mg total) by mouth daily.     atorvastatin (LIPITOR) 80 MG tablet TAKE  1 TABLET EVERY DAY 90 tablet 2   blood glucose meter kit and supplies Dispense based on patient and insurance preference. Use up to four times daily as directed. (FOR ICD-10 E10.9, E11.9). 1 each 0   blood glucose meter kit and supplies Dispense based on patient and insurance preference. Use up to four times daily as directed. (FOR ICD-10 E10.9, E11.9). 1 each 0   carvedilol (COREG) 25 MG tablet TAKE 1 TABLET TWICE DAILY (Patient taking differently: Take 25 mg by mouth 2 (two) times daily with a meal.) 180 tablet 3   Ciclopirox 0.77 % gel APPLY ONE TIME DAILY ON THE AFFECTED TOE NAIL (Patient taking differently: Apply 1 application  topically 2 (two) times daily as needed (affected toe nail). APPLY ONE TIME DAILY ON THE AFFECTED TOE NAIL.) 30 g 0   empagliflozin (JARDIANCE) 10  MG TABS tablet Take 1 tablet (10 mg total) by mouth daily before breakfast. 14 tablet 0   furosemide (LASIX) 20 MG tablet Take 1 tablet (20 mg total) by mouth every other day. 45 tablet 3   insulin aspart (NOVOLOG) 100 UNIT/ML injection Inject 6 Units into the skin 3 (three) times daily before meals. (Patient taking differently: Inject 5 Units into the skin 3 (three) times daily before meals.)     LANTUS 100 UNIT/ML injection INJECT 40 UNITS SUBCUTANEOUSLY TWICE DAILY 70 mL 1   levothyroxine (SYNTHROID) 50 MCG tablet Take 1 tablet (50 mcg total) by mouth daily before breakfast. 90 tablet 1   losartan (COZAAR) 25 MG tablet Take 1 tablet (25 mg total) by mouth daily. 90 tablet 3   Multiple Vitamin (MULTIVITAMIN) tablet Take 1 tablet by mouth daily.     polyethylene glycol (MIRALAX / GLYCOLAX) packet Take 17 g by mouth daily as needed for mild constipation.      spironolactone (ALDACTONE) 25 MG tablet TAKE 1 TABLET EVERY DAY 90 tablet 2   TRUE METRIX BLOOD GLUCOSE TEST test strip USE AS DIRECTED TO TEST BLOOD SUGAR UP TO FOUR TIMES DAILY 300 strip 1   TRUEplus Lancets 33G MISC USE AS DIRECTED TO CHECK BLOOD SUGAR UP TO FOUR TIMES DAILY 400 each 1   No current facility-administered medications for this visit.     Past Surgical History:  Procedure Laterality Date   BIV ICD INSERTION CRT-D N/A 05/03/2019   Procedure: BIV ICD INSERTION CRT-D;  Surgeon: Thompson Grayer, MD;  Location: [facility];  Service: Cardiovascular;  Laterality: N/A;   CARDIAC CATHETERIZATION     COLONOSCOPY  08/29/2012   [id number] diverticulosis   COLONOSCOPY N/A 05/28/2015   Procedure: COLONOSCOPY;  Surgeon: Daneil Dolin, MD;  Location: [facility];  Service: Endoscopy;  Laterality: N/A;  1015   ECTOPIC PREGNANCY SURGERY     ESOPHAGOGASTRODUODENOSCOPY  04/20/2012   XBL:[id number] antral gastritis/Erosive reflux esophagitis   LEFT AND RIGHT HEART CATHETERIZATION WITH CORONARY ANGIOGRAM N/A 03/28/2014   Procedure:  LEFT AND RIGHT HEART CATHETERIZATION WITH CORONARY ANGIOGRAM;  Surgeon: Peter M Martinique, MD;  Location: [facility] CATH LAB;  Service: Cardiovascular;  Laterality: N/A;   SPLENECTOMY  07/20/2005     Allergies  Allergen Reactions   Other Other (See Comments)    FLAGYL, CIPRO, PROTONIX taken at the same time caused patient to lose her breath and have trouble breathing, requiring hospital stay at [facility], 03/23/14-per patient.    Lisinopril Cough      Family History  Problem Relation Age of Onset   Heart failure Mother  Cirrhosis Father        ETOH   Heart disease Brother    Diabetes Daughter    Breast cancer Maternal Aunt    Colon cancer Neg Hx      Social History Ms. Roblero reports that she quit smoking about 43 years ago. Her smoking use included cigarettes. She started smoking about 55 years ago. She has a 2.50 pack-year smoking history. She has never used smokeless tobacco. Ms. Fenderson reports no history of alcohol use.   Review of Systems CONSTITUTIONAL: No weight loss, fever, chills, weakness or fatigue.  HEENT: Eyes: No visual loss, blurred vision, double vision or yellow sclerae.No hearing loss, sneezing, congestion, runny nose or sore throat.  SKIN: No rash or itching.  CARDIOVASCULAR: per hpi RESPIRATORY: No shortness of breath, cough or sputum.  GASTROINTESTINAL: No anorexia, nausea, vomiting or diarrhea. No abdominal pain or blood.  GENITOURINARY: No burning on urination, no polyuria NEUROLOGICAL: No headache, dizziness, syncope, paralysis, ataxia, numbness or tingling in the extremities. No change in bowel or bladder control.  MUSCULOSKELETAL: No muscle, back pain, joint pain or stiffness.  LYMPHATICS: No enlarged nodes. No history of splenectomy.  PSYCHIATRIC: No history of depression or anxiety.  ENDOCRINOLOGIC: No reports of sweating, cold or heat intolerance. No polyuria or polydipsia.  Marland Kitchen   Physical Examination Today's Vitals   05/17/22 1136  BP: (!) 96/58  Pulse:  79  SpO2: 99%  Weight: 216 lb 3.2 oz (98.1 kg)  Height: _0  (1.651 m)   Body mass index is 35.98 kg/m.  Gen: resting comfortably, no acute distress HEENT: no scleral icterus, pupils equal round and reactive, no palptable cervical adenopathy,  CV: RRR, no m/r/g no jvd Resp: Clear to auscultation bilaterally GI: abdomen is soft, non-tender, non-distended, normal bowel sounds, no hepatosplenomegaly MSK: extremities are warm, no edema.  Skin: warm, no rash Neuro:  no focal deficits Psych: appropriate affect   Diagnostic Studies 03/2014 echo Study Conclusions  - Left ventricle: The cavity size was normal. There was mild focal basal hypertrophy of the septum. Systolic function was severely reduced. The estimated ejection fraction was in the range of 25% to 30%. There is akinesis of the entireanteroseptal myocardium. Doppler parameters are consistent with a reversible restrictive pattern, indicative of decreased left ventricular diastolic compliance and/or increased left atrial pressure (grade 3 diastolic dysfunction). - Mitral valve: There was mild regurgitation. - Left atrium: The atrium was moderately dilated.  03/2014 Cath Procedural Findings:   Hemodynamics   RA 8/0 mean 4 mm Hg   RV 42/4 mm Hg   PA 44/14 mean 26 mm Hg   PCWP 20/28 mean 21 mm Hg   LV 98/18 mm Hg   AO 95/54 mean 72 mm Hg   Oxygen saturations:   PA 62%   AO 90%   Cardiac Output (Fick) 6.9 L/min   Cardiac Index (Fick) 3.4 L/min/meter squared   Coronary angiography:   Coronary dominance: right   Left mainstem: Normal   Left anterior descending (LAD): Diffuse disease in the proximal vessel up to 20%. 30-40% disease in the mid vessel prior to the second diagonal.   Left circumflex (LCx): 40% in OM1. 20-30% in OM2. Otherwise no significant disease.   Right coronary artery (RCA): 99% subtotal occlusion at the crux. TIMI 1 flow. Left to right collaterals to the distal vessel.   Left ventriculography: Left  ventricular systolic function is abnormal, LVEF is estimated at 25%. There is inferior akinesis and severe global hypokinesis. There  is mild mitral regurgitation   Final Conclusions:   1. Single vessel obstructive CAD with subtotal occlusion of the RCA at the crux.   2. Severe LV dysfunction.   3. Normal Right heart pressures. Elevated PCWP.   Recommendations: Medical management. Her degree of LV dysfunction is out of proportion to her CAD. She has no anginal symptoms so PCI of RCA not indicated. The inferior wall appears scarred.   07/2015 echo Study Conclusions   - Left ventricle: The cavity size was at the upper limits of   normal. Wall thickness was increased in a pattern of moderate   LVH. The estimated ejection fraction was 40% (similar calculation   per biplane speckle tracking, reduced global longitudinal strain   of -14.3%). There is akinesis of the basalinferolateral and   inferior myocardium. Doppler parameters are consistent with   abnormal left ventricular relaxation (grade 1 diastolic   dysfunction). - Aortic valve: Mildly calcified annulus. Trileaflet. - Mitral valve: Calcified annulus. There was trivial regurgitation. - Left atrium: The atrium was at the upper limits of normal in   size. - Right atrium: Central venous pressure (est): 3 mm Hg. - Atrial septum: A patent foramen ovale cannot be excluded. - Tricuspid valve: There was trivial regurgitation. - Pulmonary arteries: Systolic pressure could not be accurately   estimated. - Pericardium, extracardiac: There was no pericardial effusion.   Impressions:   - Upper normal LV chamber size with moderate LVH and LVEF   approximately 40% as discussed above. There is akinesis of the   basal inferoseptal and inferior myocardium. Grade 1 diastolic   dysfunction. Compared to the previous study from October 2015,   LVEF is in similar range, perhaps slightly reduced. Upper normal   left atrial chamber size. Trivial tricuspid  regurgitation. Cannot   exclude PFO based on limited images.     01/2018 echo Study Conclusions   - Left ventricle: The cavity size was normal. Wall thickness was   increased in a pattern of mild LVH. The estimated ejection   fraction was 35%. There is akinesis of the basal-midinferior   myocardium. There is akinesis of the mid-apicalanteroseptal and   apical myocardium. Doppler parameters are consistent with   abnormal left ventricular relaxation (grade 1 diastolic   dysfunction). - Ventricular septum: Septal motion showed abnormal function and   dyssynergy suggestive of left bundle Bernardina Cacho block. - Aortic valve: Moderately calcified annulus. Trileaflet. - Mitral valve: There was mild regurgitation. - Right atrium: Central venous pressure (est): 3 mm Hg. - Atrial septum: No defect or patent foramen ovale was identified. - Tricuspid valve: There was trivial regurgitation. - Pulmonary arteries: Systolic pressure could not be accurately   estimated. - Pericardium, extracardiac: There was no pericardial effusion     11/2018 echo 1. The left ventricle has moderate-severely reduced systolic function, with an ejection fraction of 30-35%. The cavity size was normal. There is mildly increased left ventricular wall thickness. Left ventricular diastolic Doppler parameters are  consistent with impaired relaxation. Elevated mean left atrial pressure.  2. The anteroseptal, anterolateral, apical walls are hypokinetic.  3. The right ventricle has normal systolic function. The cavity was normal. There is no increase in right ventricular wall thickness.  4. Left atrial size was moderately dilated.  5. No evidence of mitral valve stenosis.  6. The aortic valve is tricuspid no stenosis of the aortic valve.  7. Pulmonary hypertension is indeterminant, inadequate TR jet.  8. The inferior vena cava was dilated in size  with <50% respiratory variability.  9. The interatrial septum was not well visualized.    05/2021 ABI FINDINGS: Right ABI:  1.22   Left ABI:  1.14   Right Lower Extremity:  Monophasic waveforms are seen.   Left Lower Extremity:  Monophasic waveforms are seen.   IMPRESSION: Although ABI values are within normal limits, bilateral monophasic waveforms are still suspicious for significant underlying arterial occlusive disease. Further evaluation with CT angiography of the abdominal aorta and bilateral lower extremity should be considered.    Assessment and Plan  1. Chronic combined systolic/diastolic HF - medical therapy limited by soft bp's. Did not tolerate entresto. She is on her maximally tolerated regimen - s/p BIV AICD, barostim - no recent symptoms -20 lbs weight loss over the last year, soft bp's today. Lower coreg to 12.16m bid.    2. CAD - medically managed single vessel RCA disease - no recent symptoms, continue current meds        JArnoldo Lenis M.D.

## 2022-05-25 ENCOUNTER — Ambulatory Visit (INDEPENDENT_AMBULATORY_CARE_PROVIDER_SITE_OTHER): Payer: Medicare HMO

## 2022-05-26 NOTE — Progress Notes (Signed)
EPIC Encounter for ICM Monitoring  Patient Name: [name] is a 71 y.o. female Date: 05/26/2022 Primary Care Physican: Lindell Spar, MD Primary Cardiologist: Branch Electrophysiologist: Mealor Bi-V Pacing:  99%        01/22/2022 Weight: 223 lbs 04/01/2022 Weight: 223 lbs 05/06/2022 Weight: 221 lbs 05/17/2022 Weight: 216 lbs                                                          Spoke with patient and heart failure questions reviewed.  Pt asymptomatic for fluid accumulation.     Corvue Thoracic impedance suggesting fluid levels returned to normal.   Prescribed:  Furosemide 20 mg take 1 tablet by mouth every other day.   Spironolactone 25 mg take 1 tablet daily. Jardiance 10 mg take 1 tablet by mouth before breakfast   Labs: 05/04/2022 Creatinine 1.06, BUN 27, Potassium 5.0, Sodium 139, GFR 55 09/25/2021 Creatinine 0.94, BUN 13, Potassium 4.6, Sodium 140, GFR 64 A complete set of results can be found in Results Review.   Recommendations:   Encouraged to call if experiencing any fluid symptoms.    Follow-up plan: ICM clinic phone appointment on 06/14/2022.    91 day device clinic remote transmission 08/09/2022.        EP/Cardiology Office Visits:   11/30/2022 with Dr Harl Bowie.     Copy of ICM check sent to Dr. Myles Gip (replacing Dr Rayann Heman)    3 month ICM trend: 05/25/2022.    12-14 Month ICM trend:     [name], RN 05/26/2022 2:51 PM

## 2022-05-27 ENCOUNTER — Other Ambulatory Visit: Payer: Self-pay | Admitting: Internal Medicine

## 2022-06-02 ENCOUNTER — Other Ambulatory Visit: Payer: Self-pay | Admitting: Cardiology

## 2022-06-07 NOTE — Progress Notes (Signed)
Remote ICD transmission.   

## 2022-06-14 ENCOUNTER — Ambulatory Visit (INDEPENDENT_AMBULATORY_CARE_PROVIDER_SITE_OTHER): Payer: Medicare HMO

## 2022-06-14 DIAGNOSIS — Z95810 Presence of automatic (implantable) cardiac defibrillator: Secondary | ICD-10-CM | POA: Diagnosis not present

## 2022-06-14 DIAGNOSIS — I5022 Chronic systolic (congestive) heart failure: Secondary | ICD-10-CM

## 2022-06-14 NOTE — Progress Notes (Unsigned)
EPIC Encounter for ICM Monitoring  Patient Name: [name] is a 71 y.o. female Date: 06/14/2022 Primary Care Physican: Lindell Spar, MD Primary Cardiologist: Branch Electrophysiologist: Mealor Bi-V Pacing:  99%        01/22/2022 Weight: 223 lbs 04/01/2022 Weight: 223 lbs 05/06/2022 Weight: 221 lbs 05/17/2022 Weight: 216 lbs                                                         Attempted call to patient and unable to reach.  Left detailed message per DPR regarding transmission. Transmission reviewed.    Corvue Thoracic impedance suggesting possible fluid accumulation starting 9/21.   Prescribed:  Furosemide 20 mg take 1 tablet by mouth every other day.   Spironolactone 25 mg take 1 tablet daily. Jardiance 10 mg take 1 tablet by mouth before breakfast   Labs: 05/04/2022 Creatinine 1.06, BUN 27, Potassium 5.0, Sodium 139, GFR 55 09/25/2021 Creatinine 0.94, BUN 13, Potassium 4.6, Sodium 140, GFR 64 A complete set of results can be found in Results Review.   Recommendations:  Left voice mail with ICM number and encouraged to call if experiencing any fluid symptoms.   Follow-up plan: ICM clinic phone appointment on 06/21/2022 to recheck fluid levels.    91 day device clinic remote transmission 08/09/2022.        EP/Cardiology Office Visits:   11/30/2022 with Dr Harl Bowie.     Copy of ICM check sent to Dr. Myles Gip.  Will send to Dr Harl Bowie for review if patient is reached.   3 month ICM trend: 06/14/2022.    12-14 Month ICM trend:     [name], RN 06/14/2022 8:26 AM

## 2022-06-15 ENCOUNTER — Telehealth: Payer: Self-pay

## 2022-06-15 NOTE — Telephone Encounter (Signed)
Remote ICM transmission received.  Attempted call to patient regarding ICM remote transmission and left detailed message per DPR.  Advised to return call for any fluid symptoms or questions. Next ICM remote transmission scheduled 06/21/2022.

## 2022-06-21 ENCOUNTER — Ambulatory Visit (INDEPENDENT_AMBULATORY_CARE_PROVIDER_SITE_OTHER): Payer: Medicare HMO

## 2022-06-21 DIAGNOSIS — Z95810 Presence of automatic (implantable) cardiac defibrillator: Secondary | ICD-10-CM

## 2022-06-21 DIAGNOSIS — I5022 Chronic systolic (congestive) heart failure: Secondary | ICD-10-CM

## 2022-06-22 ENCOUNTER — Telehealth: Payer: Self-pay

## 2022-06-22 NOTE — Telephone Encounter (Signed)
Remote ICM transmission received.  Attempted call to patient regarding ICM remote transmission and left detailed message per DPR.  Advised to return call for any fluid symptoms or questions. Next ICM remote transmission scheduled 07/19/2022.

## 2022-06-22 NOTE — Progress Notes (Signed)
EPIC Encounter for ICM Monitoring  Patient Name: [name] is a 71 y.o. female Date: 06/22/2022 Primary Care Physican: Lindell Spar, MD Primary Cardiologist: Branch Electrophysiologist: Mealor Bi-V Pacing:  99%        01/22/2022 Weight: 223 lbs 04/01/2022 Weight: 223 lbs 05/06/2022 Weight: 221 lbs 05/17/2022 Weight: 216 lbs                                                         Attempted call to patient and unable to reach.  Left detailed message per DPR regarding transmission. Transmission reviewed.    Corvue Thoracic impedance suggesting fluid levels returned to normal.   Prescribed:  Furosemide 20 mg take 1 tablet by mouth every other day.   Spironolactone 25 mg take 1 tablet daily. Jardiance 10 mg take 1 tablet by mouth before breakfast   Labs: 05/04/2022 Creatinine 1.06, BUN 27, Potassium 5.0, Sodium 139, GFR 55 09/25/2021 Creatinine 0.94, BUN 13, Potassium 4.6, Sodium 140, GFR 64 A complete set of results can be found in Results Review.   Recommendations:  Left voice mail with ICM number and encouraged to call if experiencing any fluid symptoms.   Follow-up plan: ICM clinic phone appointment on 07/19/2022.    91 day device clinic remote transmission 08/09/2022.        EP/Cardiology Office Visits:   11/30/2022 with Dr Harl Bowie.     Copy of ICM check sent to Dr. Myles Gip.  3 month ICM trend: 06/21/2022.    12-14 Month ICM trend:     [name], RN 06/22/2022 4:35 PM

## 2022-07-05 ENCOUNTER — Ambulatory Visit: Payer: Medicare HMO | Admitting: Pulmonary Disease

## 2022-07-19 ENCOUNTER — Ambulatory Visit (INDEPENDENT_AMBULATORY_CARE_PROVIDER_SITE_OTHER): Payer: Medicare HMO

## 2022-07-19 DIAGNOSIS — Z95810 Presence of automatic (implantable) cardiac defibrillator: Secondary | ICD-10-CM | POA: Diagnosis not present

## 2022-07-19 DIAGNOSIS — I5022 Chronic systolic (congestive) heart failure: Secondary | ICD-10-CM

## 2022-07-19 NOTE — Progress Notes (Unsigned)
EPIC Encounter for ICM Monitoring  Patient Name: [name] is a 71 y.o. female Date: 07/19/2022 Primary Care Physican: Lindell Spar, MD Primary Cardiologist: Branch Electrophysiologist: Mealor Bi-V Pacing:  99%        01/22/2022 Weight: 223 lbs 04/01/2022 Weight: 223 lbs 05/06/2022 Weight: 221 lbs 05/17/2022 Weight: 216 lbs                                                        Spoke with patient and heart failure questions reviewed.  Transmission results reviewed.  Pt asymptomatic for fluid accumulation.  Reports feeling well at this time and voices no complaints.     Corvue Thoracic impedance suggesting normal fluid levels with the exception of possible fluid accumulation from 10/24-10/29.   Prescribed:  Furosemide 20 mg take 1 tablet by mouth every other day.   Spironolactone 25 mg take 1 tablet daily. Jardiance 10 mg take 1 tablet by mouth before breakfast   Labs: 05/04/2022 Creatinine 1.06, BUN 27, Potassium 5.0, Sodium 139, GFR 55 09/25/2021 Creatinine 0.94, BUN 13, Potassium 4.6, Sodium 140, GFR 64 A complete set of results can be found in Results Review.   Recommendations:  No changes and encouraged to call if experiencing any fluid symptoms.   Follow-up plan: ICM clinic phone appointment on 08/23/2022.    91 day device clinic remote transmission 08/09/2022.        EP/Cardiology Office Visits:   11/30/2022 with Dr Harl Bowie.     Copy of ICM check sent to Dr. Myles Gip.   3 month ICM trend: 07/19/2022.    12-14 Month ICM trend:     [name], RN 07/19/2022 9:49 AM

## 2022-08-09 ENCOUNTER — Ambulatory Visit (INDEPENDENT_AMBULATORY_CARE_PROVIDER_SITE_OTHER): Payer: Medicare HMO

## 2022-08-09 DIAGNOSIS — I5022 Chronic systolic (congestive) heart failure: Secondary | ICD-10-CM | POA: Diagnosis not present

## 2022-08-10 LAB — CUP PACEART REMOTE DEVICE CHECK
Battery Remaining Longevity: 36 mo
Battery Remaining Percentage: 46 %
Battery Voltage: 2.93 V
Brady Statistic AP VP Percent: 1 %
Brady Statistic AP VS Percent: 1 %
Brady Statistic AS VP Percent: 98 %
Brady Statistic AS VS Percent: 1 %
Brady Statistic RA Percent Paced: 1 %
Date Time Interrogation Session: 20231120020017
HighPow Impedance: 56 Ohm
HighPow Impedance: 56 Ohm
Implantable Lead Connection Status: 753985
Implantable Lead Connection Status: 753985
Implantable Lead Connection Status: 753985
Implantable Lead Implant Date: 20200813
Implantable Lead Implant Date: 20200813
Implantable Lead Implant Date: 20200813
Implantable Lead Location: 753858
Implantable Lead Location: 753859
Implantable Lead Location: 753860
Implantable Pulse Generator Implant Date: 20200813
Lead Channel Impedance Value: 390 Ohm
Lead Channel Impedance Value: 400 Ohm
Lead Channel Impedance Value: 440 Ohm
Lead Channel Pacing Threshold Amplitude: 0.5 V
Lead Channel Pacing Threshold Amplitude: 0.75 V
Lead Channel Pacing Threshold Amplitude: 1.75 V
Lead Channel Pacing Threshold Pulse Width: 0.4 ms
Lead Channel Pacing Threshold Pulse Width: 0.5 ms
Lead Channel Pacing Threshold Pulse Width: 0.6 ms
Lead Channel Sensing Intrinsic Amplitude: 12 mV
Lead Channel Sensing Intrinsic Amplitude: 5 mV
Lead Channel Setting Pacing Amplitude: 2 V
Lead Channel Setting Pacing Amplitude: 2 V
Lead Channel Setting Pacing Amplitude: 2.5 V
Lead Channel Setting Pacing Pulse Width: 0.5 ms
Lead Channel Setting Pacing Pulse Width: 0.6 ms
Lead Channel Setting Sensing Sensitivity: 0.5 mV
Pulse Gen Serial Number: 9891600
Zone Setting Status: 755011

## 2022-08-23 ENCOUNTER — Ambulatory Visit (INDEPENDENT_AMBULATORY_CARE_PROVIDER_SITE_OTHER): Payer: Medicare HMO

## 2022-08-23 DIAGNOSIS — Z95810 Presence of automatic (implantable) cardiac defibrillator: Secondary | ICD-10-CM

## 2022-08-23 DIAGNOSIS — I5022 Chronic systolic (congestive) heart failure: Secondary | ICD-10-CM | POA: Diagnosis not present

## 2022-08-27 NOTE — Progress Notes (Signed)
EPIC Encounter for ICM Monitoring  Patient Name: [name] is a 71 y.o. female Date: 08/27/2022 Primary Care Physican: Lindell Spar, MD Primary Cardiologist: Branch Electrophysiologist: Mealor Bi-V Pacing:  98%        01/22/2022 Weight: 223 lbs 04/01/2022 Weight: 223 lbs 05/06/2022 Weight: 221 lbs 05/17/2022 Weight: 216 lbs 08/27/2022 Weight: 216 lbs                                                        Spoke with patient and heart failure questions reviewed.  Transmission results reviewed.  Pt asymptomatic for fluid accumulation.  Reports feeling well at this time and voices no complaints.     Corvue Thoracic impedance suggesting normal fluid levels.   Prescribed:  Furosemide 20 mg take 1 tablet by mouth every other day.   Spironolactone 25 mg take 1 tablet daily. Jardiance 10 mg take 1 tablet by mouth before breakfast   Labs: 05/04/2022 Creatinine 1.06, BUN 27, Potassium 5.0, Sodium 139, GFR 55 09/25/2021 Creatinine 0.94, BUN 13, Potassium 4.6, Sodium 140, GFR 64 A complete set of results can be found in Results Review.   Recommendations:  No changes and encouraged to call if experiencing any fluid symptoms.   Follow-up plan: ICM clinic phone appointment on 10/04/2022.    91 day device clinic remote transmission 11/08/2022.        EP/Cardiology Office Visits:   11/30/2022 with Dr Harl Bowie.     Copy of ICM check sent to Dr. Myles Gip.   3 month ICM trend: 08/23/2022.    12-14 Month ICM trend:     [name], RN 08/27/2022 4:33 PM

## 2022-09-17 NOTE — Progress Notes (Signed)
Remote ICD transmission.   

## 2022-09-22 ENCOUNTER — Other Ambulatory Visit: Payer: Self-pay | Admitting: Internal Medicine

## 2022-09-27 ENCOUNTER — Encounter: Payer: Self-pay | Admitting: Internal Medicine

## 2022-09-27 ENCOUNTER — Ambulatory Visit (INDEPENDENT_AMBULATORY_CARE_PROVIDER_SITE_OTHER): Payer: Medicare HMO | Admitting: Internal Medicine

## 2022-09-27 VITALS — BP 125/76 | HR 96 | Ht 65.0 in | Wt 206.2 lb

## 2022-09-27 DIAGNOSIS — I10 Essential (primary) hypertension: Secondary | ICD-10-CM | POA: Diagnosis not present

## 2022-09-27 DIAGNOSIS — J069 Acute upper respiratory infection, unspecified: Secondary | ICD-10-CM | POA: Diagnosis not present

## 2022-09-27 DIAGNOSIS — I5042 Chronic combined systolic (congestive) and diastolic (congestive) heart failure: Secondary | ICD-10-CM

## 2022-09-27 DIAGNOSIS — E039 Hypothyroidism, unspecified: Secondary | ICD-10-CM | POA: Diagnosis not present

## 2022-09-27 DIAGNOSIS — E1140 Type 2 diabetes mellitus with diabetic neuropathy, unspecified: Secondary | ICD-10-CM | POA: Diagnosis not present

## 2022-09-27 DIAGNOSIS — Z794 Long term (current) use of insulin: Secondary | ICD-10-CM

## 2022-09-27 DIAGNOSIS — R269 Unspecified abnormalities of gait and mobility: Secondary | ICD-10-CM | POA: Diagnosis not present

## 2022-09-27 MED ORDER — LEVOTHYROXINE SODIUM 50 MCG PO TABS: 50.0000 ug | ORAL_TABLET | Freq: Every day | ORAL | 1 refills | Status: DC

## 2022-09-27 MED ORDER — AZITHROMYCIN 250 MG PO TABS: ORAL_TABLET | ORAL | 0 refills | Status: AC

## 2022-09-27 NOTE — Patient Instructions (Addendum)
Please take Lantus 24 Units twice daily and Novolog 10 U 3 times daily before meals.  Please continue taking other medications as prescribed.  Please continue to follow low carb diet. Please follow small, frequent meals and avoid skipping meals.  Please bring your medications in the last visit. Please bring the blood glucose log in the next visit.

## 2022-09-27 NOTE — Assessment & Plan Note (Signed)
Likely had viral prodrome, but outside of window period for flu and COVID Since she has persistent symptoms, started empiric azithromycin -due to her history of HFrEF and uncontrolled type II DM, she is at higher risk of opportunistic infections Continue Mucinex as needed for cough

## 2022-09-27 NOTE — Assessment & Plan Note (Signed)
Well-controlled with Losartan, Coreg, Spironolactone and Lasix 

## 2022-09-27 NOTE — Assessment & Plan Note (Signed)
Likely multifactorial -  due to OA of multiple joints, diabetic neuropathy, recent URTI and DDD of lumbar spine Dependent for ADLs - would benefit from home health aide, form filled out today

## 2022-09-27 NOTE — Progress Notes (Signed)
Established Patient Office Visit  Subjective:  Patient ID: [name], female    DOB: 20-May-1951  Age: 72 y.o. MRN: [id number]  CC:  Chief Complaint  Patient presents with   Follow-up    Patient is here for a six month follow up, she would like to get a nurse aide to come to her home. Patient son is concerned saying patient has had a cough, loss of appetite, fatigue ness for over a week    HPI [name] is a 72 y.o. female with past medical history of chronic combined systolic and diastolic CHF, ischemic cardiomyopathy status post ICD placement, hypertension, hyperlipidemia, type 2 diabetes mellitus, obstructive sleep apnea on BiPAP at night, obesity, coronary artery disease, GERD and hypothyroidism who presents for f/u of her chronic medical conditions.  She has not had follow-up since 11/2020. Today, she has multiple health concerns.  URTI: She has not had nasal congestion, cough, fatigue and myalgias for the last 2 weeks.  She denies any recent fever or chills.  Denies any dyspnea or wheezing.  She has not had COVID or flu test done.  She has been taking Mucinex without much relief. She also reports loss of appetite recently. Denies any hemoptysis, night sweats or weight loss.  Type II DM: She checks her blood glucose regularly -and states that it has been running between 80-140 most of the time.  She takes insulin regularly -Lantus 30 units twice daily and NovoLog 10 units 3 times daily. Last HbA1C was 7.8 in 10/21.  She has noticed some episodes of hypoglycemia, around 70s.  She reports that one of her doctors decreased her Lantus dose, but she is not clear who recommended it.  She has chronic fatigue, but denies any polyuria or polyphagia.  HTN and CHF: Her blood pressure has been stable at home. Takes medications regularly. Patient denies headache, dizziness, chest pain.  She has intermittent leg swelling.  She has a AICD in place, for followed by cardiology and EP  cardiology.  Hypothyroidism: She has been taking levothyroxine 50 mcg daily, but has not been refilling it on time.  She has chronic fatigue, but denies any recent change in weight.  She reports decreased appetite recently.  Gait imbalance: She has chronic balance problems.  She uses walker for support.  She also has history of diabetic neuropathy.  She requests home health aide to help with her ADLs.   Past Medical History:  Diagnosis Date   Anemia    Arthritis    CAD (coronary artery disease)    a. LHC (03/2014) [place]: nl, LAD: diff dz proximal 20%, 30-40% dz mid vessel prior 2nd diagonal, LCx: 40% in OM1, 20-30% in OM2, RCA: 99% subtotal occlusion @ crux, TIMI 1 flow, L-R collaterals to distal vessel   Cardiomyopathy ([place]) 03/28/2014   LV dysfunction out of proportion to CAD 03/28/14   CHF (congestive heart failure) ([place])    Phreesia 11/94/1740   Chronic systolic heart failure ([place])    a. ECHO (03/2014): EF 25-30%, akinesis enteroanteroseptal myocardium, grade III DD b. RHC (03/2014) RA 4, RV 42/4, PA 44/14 (26), PCWP 21, PA 62% Fick CO/CI 6.9 / 3.4   Diabetes mellitus    Diabetes mellitus without complication ([place])    Phreesia 10/03/2020   Duodenal ulcer    Age 78   Dyspnea    Erosive esophagitis    Gastritis    Hypertension    Hypothyroidism    Obese    Short-term  memory loss    Thyroid disease    TTP (thrombotic thrombocytopenic purpura) ([place]) 06/2005    Past Surgical History:  Procedure Laterality Date   BIV ICD INSERTION CRT-D N/A 05/03/2019   Procedure: BIV ICD INSERTION CRT-D;  Surgeon: Thompson Grayer, MD;  Location: [facility];  Service: Cardiovascular;  Laterality: N/A;   CARDIAC CATHETERIZATION     COLONOSCOPY  08/29/2012   [id number] diverticulosis   COLONOSCOPY N/A 05/28/2015   Procedure: COLONOSCOPY;  Surgeon: Daneil Dolin, MD;  Location: [facility];  Service: Endoscopy;  Laterality: N/A;  1015   ECTOPIC PREGNANCY SURGERY      ESOPHAGOGASTRODUODENOSCOPY  04/20/2012   [id number]:[id number] antral gastritis/Erosive reflux esophagitis   LEFT AND RIGHT HEART CATHETERIZATION WITH CORONARY ANGIOGRAM N/A 03/28/2014   Procedure: LEFT AND RIGHT HEART CATHETERIZATION WITH CORONARY ANGIOGRAM;  Surgeon: Peter M Martinique, MD;  Location: [facility] C[facility];  Service: Cardiovascular;  Laterality: N/A;   SPLENECTOMY  07/20/2005    Family History  Problem Relation Age of Onset   Heart failure Mother    Cirrhosis Father        ETOH   Heart disease Brother    Diabetes Daughter    Breast cancer Maternal Aunt    Colon cancer Neg Hx     Social History   Socioeconomic History   Marital status: Divorced    Spouse name: Not on file   Number of children: 3   Years of education: 12   Highest education level: Some college, no degree  Occupational History   Occupation: disabled    Fish farm manager: NOT EMPLOYED  Tobacco Use   Smoking status: Former    Packs/day: 0.25    Years: 10.00    Total pack years: 2.50    Types: Cigarettes    Start date: 09/20/1966    Quit date: 09/20/1978    Years since quitting: 44.0   Smokeless tobacco: Never  Vaping Use   Vaping Use: Never used  Substance and Sexual Activity   Alcohol use: No    Alcohol/week: 0.0 standard drinks of alcohol   Drug use: No   Sexual activity: Not Currently  Other Topics Concern   Not on file  Social History Narrative   Lives Alone       Social Determinants of Health   Financial Resource Strain: Low Risk  (09/07/2021)   Overall Financial Resource Strain (CARDIA)    Difficulty of Paying Living Expenses: Not very hard  Food Insecurity: Food Insecurity Present (09/07/2021)   Hunger Vital Sign    Worried About [place] in the Last Year: Sometimes true    Ran Out of Food in the Last Year: Sometimes true  Transportation Needs: No Transportation Needs (09/07/2021)   PRAPARE - Hydrologist (Medical): No    Lack of Transportation (Non-Medical): No   Physical Activity: Insufficiently Active (09/07/2021)   Exercise Vital Sign    Days of Exercise per Week: 2 days    Minutes of Exercise per Session: 20 min  Stress: No Stress Concern Present (09/07/2021)   [place]    Feeling of Stress : Not at all  Social Connections: Moderately Integrated (09/07/2021)   Social Connection and Isolation Panel [NHANES]    Frequency of Communication with Friends and Family: More than three times a week    Frequency of Social Gatherings with Friends and Family: Twice a week    Attends Religious  Services: More than 4 times per year    Active Member of Clubs or Organizations: Yes    Attends Archivist Meetings: More than 4 times per year    Marital Status: Divorced  Intimate Partner Violence: Not At Risk (09/07/2021)   Humiliation, Afraid, Rape, and Kick questionnaire    Fear of Current or Ex-Partner: No    Emotionally Abused: No    Physically Abused: No    Sexually Abused: No    Outpatient Medications Prior to Visit  Medication Sig Dispense Refill   acetaminophen (TYLENOL) 500 MG tablet Take 1,000 mg by mouth every 6 (six) hours as needed for moderate pain.     aspirin 81 MG chewable tablet Chew 1 tablet (81 mg total) by mouth daily.     atorvastatin (LIPITOR) 80 MG tablet TAKE 1 TABLET EVERY DAY 90 tablet 2   blood glucose meter kit and supplies Dispense based on patient and insurance preference. Use up to four times daily as directed. (FOR ICD-10 E10.9, E11.9). 1 each 0   carvedilol (COREG) 12.5 MG tablet Take 1 tablet (12.5 mg total) by mouth 2 (two) times daily. 180 tablet 3   Ciclopirox 0.77 % gel APPLY ONE TIME DAILY ON THE AFFECTED TOE NAIL (Patient taking differently: Apply 1 application  topically 2 (two) times daily as needed (affected toe nail). APPLY ONE TIME DAILY ON THE AFFECTED TOE NAIL.) 30 g 0   empagliflozin (JARDIANCE) 10 MG TABS tablet Take 1 tablet (10 mg  total) by mouth daily before breakfast. 14 tablet 0   furosemide (LASIX) 20 MG tablet TAKE 1 TABLET EVERY DAY 90 tablet 3   insulin aspart (NOVOLOG) 100 UNIT/ML injection Inject 6 Units into the skin 3 (three) times daily before meals. (Patient taking differently: Inject 5 Units into the skin 3 (three) times daily before meals.)     LANTUS 100 UNIT/ML injection INJECT 40 UNITS UNDER THE SKIN TWICE DAILY 70 mL 3   losartan (COZAAR) 25 MG tablet Take 1 tablet (25 mg total) by mouth daily. 90 tablet 3   Multiple Vitamin (MULTIVITAMIN) tablet Take 1 tablet by mouth daily.     polyethylene glycol (MIRALAX / GLYCOLAX) packet Take 17 g by mouth daily as needed for mild constipation.      spironolactone (ALDACTONE) 25 MG tablet TAKE 1 TABLET EVERY DAY 90 tablet 2   TRUE METRIX BLOOD GLUCOSE TEST test strip USE AS DIRECTED TO TEST BLOOD SUGAR UP TO FOUR TIMES DAILY 300 strip 1   TRUEplus Lancets 33G MISC USE AS DIRECTED TO CHECK BLOOD SUGAR UP TO FOUR TIMES DAILY 400 each 1   blood glucose meter kit and supplies Dispense based on patient and insurance preference. Use up to four times daily as directed. (FOR ICD-10 E10.9, E11.9). 1 each 0   levothyroxine (SYNTHROID) 50 MCG tablet Take 1 tablet (50 mcg total) by mouth daily before breakfast. 90 tablet 1   No facility-administered medications prior to visit.    Allergies  Allergen Reactions   Other Other (See Comments)    FLAGYL, CIPRO, PROTONIX taken at the same time caused patient to lose her breath and have trouble breathing, requiring hospital stay at [facility], 03/23/14-per patient.    Lisinopril Cough    ROS Review of Systems  Constitutional:  Positive for appetite change and fatigue. Negative for chills and fever.  HENT:  Positive for congestion, postnasal drip, sinus pressure and sore throat.   Eyes:  Negative for pain and discharge.  Respiratory:  Positive for cough. Negative for shortness of breath.   Cardiovascular:  Positive for leg swelling.  Negative for chest pain and palpitations.  Gastrointestinal:  Negative for abdominal pain, diarrhea, nausea and vomiting.  Endocrine: Negative for polydipsia and polyuria.  Genitourinary:  Negative for dysuria and hematuria.  Musculoskeletal:  Positive for arthralgias and back pain. Negative for neck pain and neck stiffness.  Skin:  Negative for rash.  Neurological:  Positive for weakness. Negative for speech difficulty, numbness and headaches.  Psychiatric/Behavioral:  Negative for agitation and behavioral problems.       Objective:    Physical Exam Vitals reviewed.  Constitutional:      General: She is not in acute distress.    Appearance: She is not diaphoretic.  HENT:     Head: Normocephalic and atraumatic.     Nose: Nose normal. No congestion.     Mouth/Throat:     Mouth: Mucous membranes are moist.     Pharynx: No posterior oropharyngeal erythema.  Eyes:     General: No scleral icterus.    Extraocular Movements: Extraocular movements intact.  Cardiovascular:     Rate and Rhythm: Normal rate and regular rhythm.     Pulses: Normal pulses.     Heart sounds: Normal heart sounds. No murmur heard. Pulmonary:     Breath sounds: Normal breath sounds. No wheezing or rales.  Abdominal:     Palpations: Abdomen is soft.     Tenderness: There is no abdominal tenderness.  Musculoskeletal:     Cervical back: Neck supple. No tenderness.     Right lower leg: No edema.     Left lower leg: No edema.  Skin:    General: Skin is warm.     Findings: No rash.  Neurological:     General: No focal deficit present.     Mental Status: She is alert and oriented to person, place, and time.     Sensory: No sensory deficit.     Motor: Weakness (B/l LE - 3/5) present.  Psychiatric:        Mood and Affect: Mood normal.        Behavior: Behavior normal.     BP 125/76 (BP Location: Right Arm, Patient Position: Sitting, Cuff Size: Large)   Pulse 96   Ht '5\' 5"'$  (1.651 m)   Wt 206 lb 3.2 oz  (93.5 kg)   SpO2 94%   BMI 34.31 kg/m  Wt Readings from Last 3 Encounters:  09/27/22 206 lb 3.2 oz (93.5 kg)  05/17/22 216 lb 3.2 oz (98.1 kg)  05/04/22 215 lb 3.2 oz (97.6 kg)    Lab Results  Component Value Date   TSH 5.62 (H) 03/14/2020   Lab Results  Component Value Date   WBC 5.5 07/15/2021   HGB 11.0 (L) 07/15/2021   HCT 34.2 (L) 07/15/2021   MCV 87.2 07/15/2021   PLT 150 07/15/2021   Lab Results  Component Value Date   NA 139 05/04/2022   K 5.0 05/04/2022   CO2 21 05/04/2022   GLUCOSE 99 05/04/2022   BUN 27 05/04/2022   CREATININE 1.06 (H) 05/04/2022   BILITOT 0.9 07/15/2021   ALKPHOS 73 07/15/2021   AST 25 07/15/2021   ALT 39 07/15/2021   PROT 8.0 07/15/2021   ALBUMIN 3.4 (L) 07/15/2021   CALCIUM 9.3 05/04/2022   ANIONGAP 5 07/15/2021   EGFR 55 (L) 05/04/2022   Lab Results  Component Value Date   CHOL 76 03/14/2020  Lab Results  Component Value Date   HDL 33 (L) 03/14/2020   Lab Results  Component Value Date   LDLCALC 27 03/14/2020   Lab Results  Component Value Date   TRIG 75 03/14/2020   Lab Results  Component Value Date   CHOLHDL 2.3 03/14/2020   Lab Results  Component Value Date   HGBA1C 7.8 (A) 07/09/2020   HGBA1C 7.8 07/09/2020   HGBA1C 7.8 (A) 07/09/2020   HGBA1C 7.8 (A) 07/09/2020      Assessment & Plan:   Problem List Items Addressed This Visit       Cardiovascular and Mediastinum   HTN (hypertension) - Primary    Well-controlled with Losartan, Coreg, Spironolactone and Lasix      Relevant Orders   CBC with Differential/Platelet   CMP14+EGFR   Chronic combined systolic and diastolic CHF (congestive heart failure) (HCC)    On ARB, Spironolactone and Lasix Appears euvolemic currently      Relevant Orders   CBC with Differential/Platelet     Respiratory   URTI (acute upper respiratory infection)    Likely had viral prodrome, but outside of window period for flu and COVID Since she has persistent symptoms,  started empiric azithromycin -due to her history of HFrEF and uncontrolled type II DM, she is at higher risk of opportunistic infections Continue Mucinex as needed for cough      Relevant Medications   azithromycin (ZITHROMAX) 250 MG tablet   Other Relevant Orders   CBC with Differential/Platelet     Endocrine   Type 2 diabetes mellitus with diabetic neuropathy ([place])    Lab Results  Component Value Date   HGBA1C 7.8 (A) 07/09/2020   HGBA1C 7.8 07/09/2020   HGBA1C 7.8 (A) 07/09/2020   HGBA1C 7.8 (A) 07/09/2020   On Lantus 30 U BID and Novolog 10 U TID with meals, has been decreased by a different provider, but still has hypoglycemic episodes Decreased Lantus to 24 units twice daily Referred to endocrinology Advised to follow diabetic diet On statin F/u CMP and lipid panel Diabetic eye exam: Advised to follow up with Ophthalmology for diabetic eye exam Due to episodes of hypoglycemia, might decrease insulin. Avoid skipping any meal. Advised to keep snacks or orange juice accessible most of the time. Will wait for next HbA1C.      Relevant Orders   Ambulatory referral to Endocrinology   CMP14+EGFR   Hemoglobin A1c   Urine Microalbumin w/creat. ratio   Hypothyroidism    On Levothyroxine 50 mcg QD Her fatigue and decreased appetite could be due to hypothyroidism as well, she has been taking newly levothyroxine inconsistently Advised for compliance Follow up TSH and free T4      Relevant Medications   levothyroxine (SYNTHROID) 50 MCG tablet   Other Relevant Orders   TSH + free T4     Other   Morbid obesity ([place])    Diet modification and moderate exercise/walking advised      Gait abnormality    Likely multifactorial -  due to OA of multiple joints, diabetic neuropathy, recent URTI and DDD of lumbar spine Dependent for ADLs - would benefit from home health aide, form filled out today       Meds ordered this encounter  Medications   azithromycin (ZITHROMAX) 250 MG  tablet    Sig: Take 2 tablets on day 1, then 1 tablet daily on days 2 through 5    Dispense:  6 tablet    Refill:  0  levothyroxine (SYNTHROID) 50 MCG tablet    Sig: Take 1 tablet (50 mcg total) by mouth daily before breakfast.    Dispense:  90 tablet    Refill:  1    I spent 55 minutes addressing acute and chronic medical concerns, and reviewing patient's chart, other provider's notes, recent blood tests and pertinent imaging.  Follow-up: Return in about 4 weeks (around 10/25/2022).    Lindell Spar, MD

## 2022-09-27 NOTE — Assessment & Plan Note (Addendum)
Lab Results  Component Value Date   HGBA1C 7.8 (A) 07/09/2020   HGBA1C 7.8 07/09/2020   HGBA1C 7.8 (A) 07/09/2020   HGBA1C 7.8 (A) 07/09/2020    On Lantus 30 U BID and Novolog 10 U TID with meals, has been decreased by a different provider, but still has hypoglycemic episodes Decreased Lantus to 24 units twice daily Referred to endocrinology Advised to follow diabetic diet On statin F/u CMP and lipid panel Diabetic eye exam: Advised to follow up with Ophthalmology for diabetic eye exam Due to episodes of hypoglycemia, might decrease insulin. Avoid skipping any meal. Advised to keep snacks or orange juice accessible most of the time. Will wait for next HbA1C.

## 2022-09-27 NOTE — Assessment & Plan Note (Addendum)
On Levothyroxine 50 mcg QD Her fatigue and decreased appetite could be due to hypothyroidism as well, she has been taking newly levothyroxine inconsistently Advised for compliance Follow up TSH and free T4

## 2022-09-27 NOTE — Assessment & Plan Note (Signed)
Diet modification and moderate exercise/walking advised 

## 2022-09-27 NOTE — Assessment & Plan Note (Signed)
On ARB, Spironolactone and Lasix Appears euvolemic currently 

## 2022-09-29 LAB — CMP14+EGFR
ALT: 27 IU/L (ref 0–32)
AST: 21 IU/L (ref 0–40)
Albumin/Globulin Ratio: 1 — ABNORMAL LOW (ref 1.2–2.2)
Albumin: 3.9 g/dL (ref 3.8–4.8)
Alkaline Phosphatase: 86 IU/L (ref 44–121)
BUN/Creatinine Ratio: 25 (ref 12–28)
BUN: 24 mg/dL (ref 8–27)
Bilirubin Total: 0.3 mg/dL (ref 0.0–1.2)
CO2: 22 mmol/L (ref 20–29)
Calcium: 9.2 mg/dL (ref 8.7–10.3)
Chloride: 101 mmol/L (ref 96–106)
Creatinine, Ser: 0.96 mg/dL (ref 0.57–1.00)
Globulin, Total: 4 g/dL (ref 1.5–4.5)
Glucose: 122 mg/dL — ABNORMAL HIGH (ref 70–99)
Potassium: 5 mmol/L (ref 3.5–5.2)
Sodium: 137 mmol/L (ref 134–144)
Total Protein: 7.9 g/dL (ref 6.0–8.5)
eGFR: 62 mL/min/{1.73_m2} (ref >59)

## 2022-09-29 LAB — CBC WITH DIFFERENTIAL/PLATELET
Basophils Absolute: 0 10*3/uL (ref 0.0–0.2)
Basos: 1 %
EOS (ABSOLUTE): 0.3 10*3/uL (ref 0.0–0.4)
Eos: 6 %
Hematocrit: 33.4 % — ABNORMAL LOW (ref 34.0–46.6)
Hemoglobin: 11.2 g/dL (ref 11.1–15.9)
Immature Grans (Abs): 0 10*3/uL (ref 0.0–0.1)
Immature Granulocytes: 0 %
Lymphocytes Absolute: 1.1 10*3/uL (ref 0.7–3.1)
Lymphs: 25 %
MCH: 29 pg (ref 26.6–33.0)
MCHC: 33.5 g/dL (ref 31.5–35.7)
MCV: 87 fL (ref 79–97)
Monocytes Absolute: 0.5 10*3/uL (ref 0.1–0.9)
Monocytes: 10 %
Neutrophils Absolute: 2.7 10*3/uL (ref 1.4–7.0)
Neutrophils: 58 %
Platelets: 240 10*3/uL (ref 150–450)
RBC: 3.86 x10E6/uL (ref 3.77–5.28)
RDW: 15.6 % — ABNORMAL HIGH (ref 11.7–15.4)
WBC: 4.5 10*3/uL (ref 3.4–10.8)

## 2022-09-29 LAB — HEMOGLOBIN A1C
Est. average glucose Bld gHb Est-mCnc: 143 mg/dL
Hgb A1c MFr Bld: 6.6 % — ABNORMAL HIGH (ref 4.8–5.6)

## 2022-09-29 LAB — MICROALBUMIN / CREATININE URINE RATIO
Creatinine, Urine: 185.2 mg/dL
Microalb/Creat Ratio: 19 mg/g creat (ref 0–29)
Microalbumin, Urine: 34.5 ug/mL

## 2022-09-29 LAB — TSH+FREE T4
Free T4: 1 ng/dL (ref 0.82–1.77)
TSH: 2.36 u[IU]/mL (ref 0.450–4.500)

## 2022-10-04 ENCOUNTER — Ambulatory Visit (INDEPENDENT_AMBULATORY_CARE_PROVIDER_SITE_OTHER): Payer: Medicare HMO

## 2022-10-04 DIAGNOSIS — Z95810 Presence of automatic (implantable) cardiac defibrillator: Secondary | ICD-10-CM

## 2022-10-04 DIAGNOSIS — I5022 Chronic systolic (congestive) heart failure: Secondary | ICD-10-CM | POA: Diagnosis not present

## 2022-10-07 NOTE — Progress Notes (Signed)
EPIC Encounter for ICM Monitoring  Patient Name: [name] is a 72 y.o. female Date: 10/07/2022 Primary Care Physican: Lindell Spar, MD Primary Cardiologist: Branch Electrophysiologist: Mealor Bi-V Pacing:  98%        01/22/2022 Weight: 223 lbs 04/01/2022 Weight: 223 lbs 05/06/2022 Weight: 221 lbs 05/17/2022 Weight: 216 lbs 08/27/2022 Weight: 216 lbs 10/07/2022 Weight: 206 lbs                                                        Spoke with patient and heart failure questions reviewed.  Transmission results reviewed.  Pt asymptomatic for fluid accumulation.  She is recovering from has a respiratory virus that started 3 weeks ago.   [place] Thoracic impedance suggesting normal fluid levels.   Prescribed:  Furosemide 20 mg take 1 tablet by mouth daily.   Spironolactone 25 mg take 1 tablet daily. Jardiance 10 mg take 1 tablet by mouth before breakfast   Labs: 09/27/2022 Creatinine 0.96, BUN 24, Potassium 5.0, Sodium 137, GFR 62 05/04/2022 Creatinine 1.06, BUN 27, Potassium 5.0, Sodium 139, GFR 55 09/25/2021 Creatinine 0.94, BUN 13, Potassium 4.6, Sodium 140, GFR 64 A complete set of results can be found in Results Review.   Recommendations:  No changes and encouraged to call if experiencing any fluid symptoms.   Follow-up plan: ICM clinic phone appointment on 11/09/2022.    91 day device clinic remote transmission 11/08/2022.        EP/Cardiology Office Visits:   11/30/2022 with Dr Harl Bowie.  Advised to to call office to make an appointment with Dr Myles Gip.  Recall 10/31/2022 with Dr Myles Gip.   Copy of ICM check sent to Dr. Myles Gip.   3 month ICM trend: 10/04/2022.    12-14 Month ICM trend:     [name], RN 10/07/2022 10:47 AM

## 2022-10-13 ENCOUNTER — Encounter (HOSPITAL_COMMUNITY): Payer: Self-pay | Admitting: *Deleted

## 2022-10-13 ENCOUNTER — Emergency Department (HOSPITAL_COMMUNITY): Payer: Medicare HMO

## 2022-10-13 ENCOUNTER — Emergency Department (HOSPITAL_COMMUNITY)
Admission: EM | Admit: 2022-10-13 | Discharge: 2022-10-13 | Disposition: A | Discharge status: Home / Self Care | Payer: Medicare HMO | Attending: Emergency Medicine | Admitting: Emergency Medicine

## 2022-10-13 ENCOUNTER — Other Ambulatory Visit: Payer: Self-pay

## 2022-10-13 DIAGNOSIS — I509 Heart failure, unspecified: Secondary | ICD-10-CM | POA: Insufficient documentation

## 2022-10-13 DIAGNOSIS — I110 Hypertensive heart disease with heart failure: Secondary | ICD-10-CM | POA: Insufficient documentation

## 2022-10-13 DIAGNOSIS — E1165 Type 2 diabetes mellitus with hyperglycemia: Secondary | ICD-10-CM | POA: Insufficient documentation

## 2022-10-13 DIAGNOSIS — I2510 Atherosclerotic heart disease of native coronary artery without angina pectoris: Secondary | ICD-10-CM | POA: Diagnosis not present

## 2022-10-13 DIAGNOSIS — Z794 Long term (current) use of insulin: Secondary | ICD-10-CM | POA: Diagnosis not present

## 2022-10-13 DIAGNOSIS — Z79899 Other long term (current) drug therapy: Secondary | ICD-10-CM | POA: Diagnosis not present

## 2022-10-13 DIAGNOSIS — E860 Dehydration: Secondary | ICD-10-CM | POA: Diagnosis not present

## 2022-10-13 DIAGNOSIS — Z7984 Long term (current) use of oral hypoglycemic drugs: Secondary | ICD-10-CM | POA: Insufficient documentation

## 2022-10-13 DIAGNOSIS — E039 Hypothyroidism, unspecified: Secondary | ICD-10-CM | POA: Diagnosis not present

## 2022-10-13 DIAGNOSIS — R531 Weakness: Secondary | ICD-10-CM

## 2022-10-13 DIAGNOSIS — Z79890 Hormone replacement therapy (postmenopausal): Secondary | ICD-10-CM | POA: Diagnosis not present

## 2022-10-13 DIAGNOSIS — Z7982 Long term (current) use of aspirin: Secondary | ICD-10-CM | POA: Insufficient documentation

## 2022-10-13 DIAGNOSIS — R059 Cough, unspecified: Secondary | ICD-10-CM | POA: Diagnosis not present

## 2022-10-13 LAB — BASIC METABOLIC PANEL
Anion gap: 7 (ref 5–15)
BUN: 19 mg/dL (ref 8–23)
CO2: 25 mmol/L (ref 22–32)
Calcium: 9 mg/dL (ref 8.9–10.3)
Chloride: 101 mmol/L (ref 98–111)
Creatinine, Ser: 0.94 mg/dL (ref 0.44–1.00)
GFR, Estimated: >60 mL/min (ref >60)
Glucose, Bld: 122 mg/dL — ABNORMAL HIGH (ref 70–99)
Potassium: 4.1 mmol/L (ref 3.5–5.1)
Sodium: 133 mmol/L — ABNORMAL LOW (ref 135–145)

## 2022-10-13 LAB — TSH: TSH: 2.951 u[IU]/mL (ref 0.350–4.500)

## 2022-10-13 LAB — CBC
HCT: 34.5 % — ABNORMAL LOW (ref 36.0–46.0)
Hemoglobin: 11.3 g/dL — ABNORMAL LOW (ref 12.0–15.0)
MCH: 28.4 pg (ref 26.0–34.0)
MCHC: 32.8 g/dL (ref 30.0–36.0)
MCV: 86.7 fL (ref 80.0–100.0)
Platelets: 160 10*3/uL (ref 150–400)
RBC: 3.98 MIL/uL (ref 3.87–5.11)
RDW: 15.7 % — ABNORMAL HIGH (ref 11.5–15.5)
WBC: 4.6 10*3/uL (ref 4.0–10.5)
nRBC: 0 % (ref 0.0–0.2)

## 2022-10-13 LAB — T4, FREE: Free T4: 0.77 ng/dL (ref 0.61–1.12)

## 2022-10-13 LAB — BRAIN NATRIURETIC PEPTIDE: B Natriuretic Peptide: 39 pg/mL (ref 0.0–100.0)

## 2022-10-13 MED ORDER — SODIUM CHLORIDE 0.9 % IV BOLUS
500.0000 mL | Freq: Once | INTRAVENOUS | Status: AC
  Administered 2022-10-13: 500 mL via INTRAVENOUS

## 2022-10-13 MED ORDER — ONDANSETRON HCL 4 MG PO TABS: 4.0000 mg | ORAL_TABLET | Freq: Four times a day (QID) | ORAL | 0 refills | Status: DC

## 2022-10-13 NOTE — ED Notes (Signed)
Pacemaker company called and reported that there were no findings with pt's pacemaker and it was working normally when they just interrogated it.

## 2022-10-13 NOTE — ED Provider Notes (Signed)
[facility] Provider Note   CSN: [id number] Arrival date & time: 10/13/22  5361     History  Chief Complaint  Patient presents with   Weakness    [name] is a 72 y.o. female with history of diabetes, hypertension, hypothyroidism, CHF (EF 25 to 30%), CAD who presents to the emergency department complaining of generalized weakness, nausea and vomiting.  Patient states that she saw her primary doctor a few weeks ago with concern for an upper respiratory infection.  They placed her on a course of azithromycin which she finished.  She said while taking it that she felt like she was getting better, but since stopping it about a week ago her symptoms have been getting worse.  Still has a productive cough, only feels short of breath after heavy period of coughing.  No chest or abdominal pain.  No fever.  Over the weekend had a few episodes of nonbloody emesis.  Overall been feeling weak and dizzy, especially when going from sitting to standing. Her son encouraged her to come be evaluated today.    Weakness Associated symptoms: cough, dizziness, nausea and vomiting        Home Medications Prior to Admission medications   Medication Sig Start Date End Date Taking? Authorizing Provider  ondansetron (ZOFRAN) 4 MG tablet Take 1 tablet (4 mg total) by mouth every 6 (six) hours. 10/13/22  Yes Izzy Doubek T, PA-C  acetaminophen (TYLENOL) 500 MG tablet Take 1,000 mg by mouth every 6 (six) hours as needed for moderate pain.    [provider]  aspirin 81 MG chewable tablet Chew 1 tablet (81 mg total) by mouth daily. 03/31/14   Charlynne Cousins, MD  atorvastatin (LIPITOR) 80 MG tablet TAKE 1 TABLET EVERY DAY 11/17/21   Arnoldo Lenis, MD  blood glucose meter kit and supplies Dispense based on patient and insurance preference. Use up to four times daily as directed. (FOR ICD-10 E10.9, E11.9). 01/04/22   Lindell Spar, MD  carvedilol  (COREG) 12.5 MG tablet Take 1 tablet (12.5 mg total) by mouth 2 (two) times daily. 05/17/22   Arnoldo Lenis, MD  Ciclopirox 0.77 % gel APPLY ONE TIME DAILY ON THE AFFECTED TOE NAIL Patient taking differently: Apply 1 application  topically 2 (two) times daily as needed (affected toe nail). APPLY ONE TIME DAILY ON THE AFFECTED TOE NAIL. 04/21/21   Lindell Spar, MD  empagliflozin (JARDIANCE) 10 MG TABS tablet Take 1 tablet (10 mg total) by mouth daily before breakfast. 01/26/21   Allred, Jeneen Rinks, MD  furosemide (LASIX) 20 MG tablet TAKE 1 TABLET EVERY DAY 06/02/22   Arnoldo Lenis, MD  insulin aspart (NOVOLOG) 100 UNIT/ML injection Inject 6 Units into the skin 3 (three) times daily before meals. Patient taking differently: Inject 5 Units into the skin 3 (three) times daily before meals. 01/23/21   Allred, Jeneen Rinks, MD  LANTUS 100 UNIT/ML injection INJECT 40 UNITS UNDER THE SKIN TWICE DAILY 09/22/22   Lindell Spar, MD  levothyroxine (SYNTHROID) 50 MCG tablet Take 1 tablet (50 mcg total) by mouth daily before breakfast. 09/27/22   Lindell Spar, MD  losartan (COZAAR) 25 MG tablet Take 1 tablet (25 mg total) by mouth daily. 01/01/22   Arnoldo Lenis, MD  Multiple Vitamin (MULTIVITAMIN) tablet Take 1 tablet by mouth daily.    [provider]  polyethylene glycol (MIRALAX / GLYCOLAX) packet Take 17 g by mouth  daily as needed for mild constipation.     [provider]  spironolactone (ALDACTONE) 25 MG tablet TAKE 1 TABLET EVERY DAY 11/17/21   Arnoldo Lenis, MD  TRUE METRIX BLOOD GLUCOSE TEST test strip USE AS DIRECTED TO TEST BLOOD SUGAR UP TO FOUR TIMES DAILY 04/09/22   Lindell Spar, MD  TRUEplus Lancets 33G MISC USE AS DIRECTED TO CHECK BLOOD SUGAR UP TO FOUR TIMES DAILY 04/15/22   Lindell Spar, MD      Allergies    Other and Lisinopril    Review of Systems   Review of Systems  Constitutional:  Positive for appetite change and fatigue.  Respiratory:  Positive for cough.    Gastrointestinal:  Positive for nausea and vomiting.  Neurological:  Positive for dizziness and weakness.  All other systems reviewed and are negative.   Physical Exam Updated Vital Signs BP 132/86   Pulse 79   Temp 98.6 F (37 C) (Oral)   Resp (!) 21   Ht '5\' 5"'$  (1.651 m)   Wt 93.4 kg   SpO2 99%   BMI 34.28 kg/m  Physical Exam Vitals and nursing note reviewed.  Constitutional:      Appearance: Normal appearance.  HENT:     Head: Normocephalic and atraumatic.  Eyes:     Conjunctiva/sclera: Conjunctivae normal.  Cardiovascular:     Rate and Rhythm: Normal rate and regular rhythm.  Pulmonary:     Effort: Pulmonary effort is normal. No respiratory distress.     Breath sounds: Normal breath sounds.  Abdominal:     General: There is no distension.     Palpations: Abdomen is soft.     Tenderness: There is no abdominal tenderness.  Skin:    General: Skin is warm and dry.     Coloration: Skin is pale.  Neurological:     General: No focal deficit present.     Mental Status: She is alert.     ED Results / Procedures / Treatments   Labs (all labs ordered are listed, but only abnormal results are displayed) Labs Reviewed  CBC - Abnormal; Notable for the following components:      Result Value   Hemoglobin 11.3 (*)    HCT 34.5 (*)    RDW 15.7 (*)    All other components within normal limits  BASIC METABOLIC PANEL - Abnormal; Notable for the following components:   Sodium 133 (*)    Glucose, Bld 122 (*)    All other components within normal limits  TSH  BRAIN NATRIURETIC PEPTIDE  T4, FREE    EKG None  Radiology DG Chest 2 View  Result Date: 10/13/2022 CLINICAL DATA:  Provided history: Productive cough for 2 weeks. EXAM: CHEST - 2 VIEW COMPARISON:  Prior chest radiographs 05/04/2019 and earlier. FINDINGS: Generator pack along the right chest with partially imaged lead extending superiorly toward the neck. Left chest multi-lead implantable cardiac device. Heart  size at the upper limits of normal. Aortic atherosclerosis. Elevation of the right hemidiaphragm. No appreciable airspace consolidation or pulmonary edema. No evidence of pleural effusion or pneumothorax. Degenerative changes of the spine. IMPRESSION: No appreciable airspace consolidation or pulmonary edema. Elevation of the right hemidiaphragm, progressed from the prior examination of 05/04/2019. Aortic Atherosclerosis (ICD10-I70.0). Electronically Signed   By: Kellie Simmering D.O.   On: 10/13/2022 10:05    Procedures Procedures    Medications Ordered in ED Medications  sodium chloride 0.9 % bolus 500 mL (0 mLs Intravenous  Stopped 10/13/22 1130)    ED Course/ Medical Decision Making/ A&P                             Medical Decision Making Amount and/or Complexity of Data Reviewed Labs: ordered. Radiology: ordered.  Risk Prescription drug management.  This patient is a 72 y.o. female  who presents to the ED for concern of generalized weakness for 1 week.   Differential diagnoses prior to evaluation: The emergent differential diagnosis includes, but is not limited to,  CVA, spinal cord injury, ACS, arrhythmia, syncope, orthostatic hypotension, sepsis, hypoglycemia, electrolyte disturbance, hypothyroidism, respiratory failure, anemia, dehydration, heat injury, polypharmacy, malignancy. This is not an exhaustive differential.   Past Medical History / Co-morbidities: diabetes, hypertension, hypothyroidism, CHF (EF 25 to 30%), CAD  Additional history: Chart reviewed. Pertinent results include: Prior issue with hypothyroid medication compliance due to difficulty filling prescription.  Most recent PCP visit on 1/8 with Dr. Posey Pronto.  She was experiencing some upper respiratory symptoms at the time, and due to her extensive past medical history he empirically placed on a course of azithromycin.  Physical Exam: Physical exam performed. The pertinent findings include: Normal vital signs, no acute  distress.  Abdomen soft, nontender.  No leg swelling.  Lab Tests/Imaging studies: I personally interpreted labs/imaging and the pertinent results include: Hemoglobin at baseline.  Sodium 133, mildly elevated glucose, otherwise BMP unremarkable.  BNP 39.  Normal TSH.  Chest x-ray with no consolidation or effusions. I agree with the radiologist interpretation.  Cardiac monitoring: Pacemaker was interrogated, no issues reported   Medications: I ordered medication including gentle IV fluids.  I have reviewed the patients home medicines and have made adjustments as needed.   Disposition: After consideration of the diagnostic results and the patients response to treatment, I feel that emergency department workup does not suggest an emergent condition requiring admission or immediate intervention beyond what has been performed at this time. The plan is: Discharged home with symptomatic management of likely dehydration in the setting of postviral syndrome.  Overall physical exam and laboratory evaluation unremarkable.  Patient feels significantly improved after 500 cc bolus of IV fluids.  She is feeling hungry and ready to eat something.  Will give small course of Zofran as needed.  Recommend follow-up with PCP.Marland Kitchen The patient is safe for discharge and has been instructed to return immediately for worsening symptoms, change in symptoms or any other concerns.  Final Clinical Impression(s) / ED Diagnoses Final diagnoses:  Weakness  Dehydration    Rx / DC Orders ED Discharge Orders          Ordered    ondansetron (ZOFRAN) 4 MG tablet  Every 6 hours        10/13/22 1136           Portions of this report may have been transcribed using voice recognition software. Every effort was made to ensure accuracy; however, inadvertent computerized transcription errors may be present.    Estill Cotta 10/13/22 1141    Carmin Muskrat, MD 10/13/22 1555

## 2022-10-13 NOTE — ED Triage Notes (Signed)
Pt c/o n/v and weakness after finishing up her antibiotic last Friday. Pt saw Dr. Posey Pronto Monday before last and given antibiotic because she was told she had "some kind of virus, but not Covid". Pt started feeling better while taking the antibiotic and once she finished it the n/v and weakness started.

## 2022-10-13 NOTE — Discharge Instructions (Addendum)
You were seen in the emergency department for weakness.  As we discussed, your lab work and imaging today looks reassuring.  I suspect that you likely were somewhat dehydrated from the vomiting you experienced over the weekend.  I am glad that you are feeling better with some IV fluids.  Sometimes you can have a postviral syndrome in which you have some symptoms that last for a few weeks after an infection.  I recommend letting your primary doctor know that you were seen in the ER today so that they can review your records and follow-up with you.  Continue to monitor how you're doing and return to the ER for new or worsening symptoms.

## 2022-10-20 ENCOUNTER — Telehealth: Payer: Self-pay

## 2022-10-20 NOTE — Telephone Encounter (Signed)
     Patient  visit on 10/13/2022  at [facility] was for weakness, dehydration.  Have you been able to follow up with your primary care physician? Patient has appointment scheduled 10/25/2022.  The patient was or was not able to obtain any needed medicine or equipment. Patient was able to obtain medication.  Are there diet recommendations that you are having difficulty following? No  Patient expresses understanding of discharge instructions and education provided has no other needs at this time.    [facility] Guide   [email or web address]  ?? [phone]   Website: [email or web address]  [email or web address]

## 2022-10-25 ENCOUNTER — Ambulatory Visit: Payer: Medicare HMO | Admitting: Internal Medicine

## 2022-11-02 ENCOUNTER — Ambulatory Visit (INDEPENDENT_AMBULATORY_CARE_PROVIDER_SITE_OTHER): Payer: Medicare HMO | Admitting: Internal Medicine

## 2022-11-02 ENCOUNTER — Encounter: Payer: Self-pay | Admitting: Internal Medicine

## 2022-11-02 VITALS — BP 110/73 | HR 90 | Ht 65.0 in | Wt 206.8 lb

## 2022-11-02 DIAGNOSIS — I5042 Chronic combined systolic (congestive) and diastolic (congestive) heart failure: Secondary | ICD-10-CM

## 2022-11-02 DIAGNOSIS — E039 Hypothyroidism, unspecified: Secondary | ICD-10-CM

## 2022-11-02 DIAGNOSIS — Z794 Long term (current) use of insulin: Secondary | ICD-10-CM

## 2022-11-02 DIAGNOSIS — Z23 Encounter for immunization: Secondary | ICD-10-CM

## 2022-11-02 DIAGNOSIS — I10 Essential (primary) hypertension: Secondary | ICD-10-CM | POA: Diagnosis not present

## 2022-11-02 DIAGNOSIS — E1140 Type 2 diabetes mellitus with diabetic neuropathy, unspecified: Secondary | ICD-10-CM

## 2022-11-02 MED ORDER — LANTUS SOLOSTAR 100 UNIT/ML ~~LOC~~ SOPN: 24.0000 [IU] | PEN_INJECTOR | Freq: Every day | SUBCUTANEOUS | 3 refills | Status: DC

## 2022-11-02 MED ORDER — NOVOLOG FLEXPEN 100 UNIT/ML ~~LOC~~ SOPN: 6.0000 [IU] | PEN_INJECTOR | Freq: Three times a day (TID) | SUBCUTANEOUS | 3 refills | Status: DC

## 2022-11-02 NOTE — Progress Notes (Signed)
Established Patient Office Visit  Subjective:  Patient ID: [name], female    DOB: 1950/10/24  Age: 72 y.o. MRN: [id number]  CC:  Chief Complaint  Patient presents with   Hypertension    Follow up    HPI [name] is a 72 y.o. female with past medical history of chronic combined systolic and diastolic CHF, ischemic cardiomyopathy status post ICD placement, hypertension, hyperlipidemia, type 2 diabetes mellitus, obstructive sleep apnea on BiPAP at night, obesity, coronary artery disease, GERD and hypothyroidism who presents for f/u of her chronic medical conditions.  She reports chronic fatigue and visual disturbance -blurry vision.  Of note, she has a history of type II DM and has not been to Ophthalmologist for a long time.  Denies any redness or discharge from the eyes.  Denies any eye pain currently.  Type II DM: She checks her blood glucose regularly -and states that it has been running between 90-135 most of the time.  She takes insulin regularly -Lantus 24 units twice daily and NovoLog 6 units 3 times daily now. Last HbA1C was 6.6 in 01/24.  She has not noticed any episodes of hypoglycemia since decreasing the dose of Lantus in the last visit. She has chronic fatigue, but denies any polyuria or polyphagia.  HTN and CHF: Her blood pressure has been stable at home. Takes medications regularly. Patient denies headache, dizziness, chest pain.  She has intermittent leg swelling.  She has a AICD in place, for followed by cardiology and EP cardiology.  Hypothyroidism: She has been taking levothyroxine 50 mcg daily, but has not been refilling it on time.  She has chronic fatigue, but denies any recent change in weight.  Gait imbalance: She has chronic balance problems.  She uses walker for support.  She also has history of diabetic neuropathy.  She requests home health aide to help with her ADLs.   Past Medical History:  Diagnosis Date   Anemia    Arthritis    CAD (coronary  artery disease)    a. LHC (03/2014) [place]: nl, LAD: diff dz proximal 20%, 30-40% dz mid vessel prior 2nd diagonal, LCx: 40% in OM1, 20-30% in OM2, RCA: 99% subtotal occlusion @ crux, TIMI 1 flow, L-R collaterals to distal vessel   Cardiomyopathy ([place]) 03/28/2014   LV dysfunction out of proportion to CAD 03/28/14   CHF (congestive heart failure) ([place])    Phreesia [id number]   Chronic systolic heart failure ([place])    a. ECHO (03/2014): EF 25-30%, akinesis enteroanteroseptal myocardium, grade III DD b. RHC (03/2014) RA 4, RV 42/4, PA 44/14 (26), PCWP 21, PA 62% Fick CO/CI 6.9 / 3.4   Diabetes mellitus    Diabetes mellitus without complication ([place])    Phreesia 10/03/2020   Duodenal ulcer    Age 21   Dyspnea    Erosive esophagitis    Gastritis    Hypertension    Hypothyroidism    Obese    Short-term memory loss    Thyroid disease    TTP (thrombotic thrombocytopenic purpura) ([place]) 06/2005    Past Surgical History:  Procedure Laterality Date   BIV ICD INSERTION CRT-D N/A 05/03/2019   Procedure: BIV ICD INSERTION CRT-D;  Surgeon: Thompson Grayer, MD;  Location: [facility];  Service: Cardiovascular;  Laterality: N/A;   CARDIAC CATHETERIZATION     COLONOSCOPY  08/29/2012   [id number] diverticulosis   COLONOSCOPY N/A 05/28/2015   Procedure: COLONOSCOPY;  Surgeon: Daneil Dolin, MD;  [facility];  Service: Endoscopy;  Laterality: N/A;  1015   ECTOPIC PREGNANCY SURGERY     ESOPHAGOGASTRODUODENOSCOPY  04/20/2012   [id number] antral gastritis/Erosive reflux esophagitis   LEFT AND RIGHT HEART CATHETERIZATION WITH CORONARY ANGIOGRAM N/A 03/28/2014   Procedure: LEFT AND RIGHT HEART CATHETERIZATION WITH CORONARY ANGIOGRAM;  Surgeon: Peter M Martinique, MD;  Location: [facility] CATH LAB;  Service: Cardiovascular;  Laterality: N/A;   SPLENECTOMY  07/20/2005    Family History  Problem Relation Age of Onset   Heart failure Mother    Cirrhosis Father        ETOH   Heart disease Brother     Diabetes Daughter    Breast cancer Maternal Aunt    Colon cancer Neg Hx     Social History   Socioeconomic History   Marital status: Divorced    Spouse name: Not on file   Number of children: 3   Years of education: 12   Highest education level: Some college, no degree  Occupational History   Occupation: disabled    Fish farm manager: NOT EMPLOYED  Tobacco Use   Smoking status: Former    Packs/day: 0.25    Years: 10.00    Total pack years: 2.50    Types: Cigarettes    Start date: 09/20/1966    Quit date: 09/20/1978    Years since quitting: 44.1   Smokeless tobacco: Never  Vaping Use   Vaping Use: Never used  Substance and Sexual Activity   Alcohol use: No    Alcohol/week: 0.0 standard drinks of alcohol   Drug use: No   Sexual activity: Not Currently  Other Topics Concern   Not on file  Social History Narrative   Lives Alone       Social Determinants of Health   Financial Resource Strain: Low Risk  (09/07/2021)   Overall Financial Resource Strain (CARDIA)    Difficulty of Paying Living Expenses: Not very hard  Food Insecurity: Food Insecurity Present (09/07/2021)   Hunger Vital Sign    Worried About [place] in the Last Year: Sometimes true    Ran Out of Food in the Last Year: Sometimes true  Transportation Needs: No Transportation Needs (09/07/2021)   PRAPARE - Hydrologist (Medical): No    Lack of Transportation (Non-Medical): No  Physical Activity: Insufficiently Active (09/07/2021)   Exercise Vital Sign    Days of Exercise per Week: 2 days    Minutes of Exercise per Session: 20 min  Stress: No Stress Concern Present (09/07/2021)   [place]    Feeling of Stress : Not at all  Social Connections: Moderately Integrated (09/07/2021)   Social Connection and Isolation Panel [NHANES]    Frequency of Communication with Friends and Family: More than three times a week     Frequency of Social Gatherings with Friends and Family: Twice a week    Attends Religious Services: More than 4 times per year    Active Member of [employer] or Organizations: Yes    Attends Archivist Meetings: More than 4 times per year    Marital Status: Divorced  Intimate Partner Violence: Not At Risk (09/07/2021)   Humiliation, Afraid, Rape, and Kick questionnaire    Fear of Current or Ex-Partner: No    Emotionally Abused: No    Physically Abused: No    Sexually Abused: No    Outpatient Medications Prior to Visit  Medication Sig Dispense Refill   acetaminophen (TYLENOL) 500 MG tablet Take 1,000 mg by mouth every 6 (six) hours as needed for moderate pain.     aspirin 81 MG chewable tablet Chew 1 tablet (81 mg total) by mouth daily.     atorvastatin (LIPITOR) 80 MG tablet TAKE 1 TABLET EVERY DAY 90 tablet 2   blood glucose meter kit and supplies Dispense based on patient and insurance preference. Use up to four times daily as directed. (FOR ICD-10 E10.9, E11.9). 1 each 0   carvedilol (COREG) 12.5 MG tablet Take 1 tablet (12.5 mg total) by mouth 2 (two) times daily. 180 tablet 3   Ciclopirox 0.77 % gel APPLY ONE TIME DAILY ON THE AFFECTED TOE NAIL (Patient taking differently: Apply 1 application  topically 2 (two) times daily as needed (affected toe nail). APPLY ONE TIME DAILY ON THE AFFECTED TOE NAIL.) 30 g 0   empagliflozin (JARDIANCE) 10 MG TABS tablet Take 1 tablet (10 mg total) by mouth daily before breakfast. 14 tablet 0   furosemide (LASIX) 20 MG tablet TAKE 1 TABLET EVERY DAY 90 tablet 3   levothyroxine (SYNTHROID) 50 MCG tablet Take 1 tablet (50 mcg total) by mouth daily before breakfast. 90 tablet 1   losartan (COZAAR) 25 MG tablet Take 1 tablet (25 mg total) by mouth daily. 90 tablet 3   Multiple Vitamin (MULTIVITAMIN) tablet Take 1 tablet by mouth daily.     ondansetron (ZOFRAN) 4 MG tablet Take 1 tablet (4 mg total) by mouth every 6 (six) hours. 10 tablet 0    polyethylene glycol (MIRALAX / GLYCOLAX) packet Take 17 g by mouth daily as needed for mild constipation.      spironolactone (ALDACTONE) 25 MG tablet TAKE 1 TABLET EVERY DAY 90 tablet 2   TRUE METRIX BLOOD GLUCOSE TEST test strip USE AS DIRECTED TO TEST BLOOD SUGAR UP TO FOUR TIMES DAILY 300 strip 1   TRUEplus Lancets 33G MISC USE AS DIRECTED TO CHECK BLOOD SUGAR UP TO FOUR TIMES DAILY 400 each 1   insulin aspart (NOVOLOG) 100 UNIT/ML injection Inject 6 Units into the skin 3 (three) times daily before meals. (Patient taking differently: Inject 5 Units into the skin 3 (three) times daily before meals.)     LANTUS 100 UNIT/ML injection INJECT 40 UNITS UNDER THE SKIN TWICE DAILY 70 mL 3   No facility-administered medications prior to visit.    Allergies  Allergen Reactions   Other Other (See Comments)    FLAGYL, CIPRO, PROTONIX taken at the same time caused patient to lose her breath and have trouble breathing, requiring hospital stay at [facility], 03/23/14-per patient.    Lisinopril Cough    ROS Review of Systems  Constitutional:  Positive for appetite change and fatigue. Negative for chills and fever.  HENT:  Positive for congestion. Negative for sore throat.   Eyes:  Positive for visual disturbance. Negative for pain and discharge.  Respiratory:  Negative for cough and shortness of breath.   Cardiovascular:  Positive for leg swelling. Negative for chest pain and palpitations.  Gastrointestinal:  Negative for abdominal pain, diarrhea, nausea and vomiting.  Endocrine: Negative for polydipsia and polyuria.  Genitourinary:  Negative for dysuria and hematuria.  Musculoskeletal:  Positive for arthralgias and back pain. Negative for neck pain and neck stiffness.  Skin:  Negative for rash.  Neurological:  Positive for weakness. Negative for speech difficulty, numbness and headaches.  Psychiatric/Behavioral:  Negative for agitation and behavioral problems.  Objective:    Physical Exam Vitals  reviewed.  Constitutional:      General: She is not in acute distress.    Appearance: She is not diaphoretic.  HENT:     Head: Normocephalic and atraumatic.     Nose: Nose normal. No congestion.     Mouth/Throat:     Mouth: Mucous membranes are moist.     Pharynx: No posterior oropharyngeal erythema.  Eyes:     General: No scleral icterus.    Extraocular Movements: Extraocular movements intact.  Cardiovascular:     Rate and Rhythm: Normal rate and regular rhythm.     Pulses: Normal pulses.     Heart sounds: Normal heart sounds. No murmur heard. Pulmonary:     Breath sounds: Normal breath sounds. No wheezing or rales.  Abdominal:     Palpations: Abdomen is soft.     Tenderness: There is no abdominal tenderness.  Musculoskeletal:     Cervical back: Neck supple. No tenderness.     Right lower leg: No edema.     Left lower leg: No edema.  Skin:    General: Skin is warm.     Findings: No rash.  Neurological:     General: No focal deficit present.     Mental Status: She is alert and oriented to person, place, and time.     Sensory: No sensory deficit.     Motor: Weakness (B/l LE - 3/5) present.  Psychiatric:        Mood and Affect: Mood normal.        Behavior: Behavior normal.     BP 110/73 (BP Location: Right Arm, Patient Position: Sitting, Cuff Size: Large)   Pulse 90   Ht 5' 5"$  (1.651 m)   Wt 206 lb 12.8 oz (93.8 kg)   SpO2 93%   BMI 34.41 kg/m  Wt Readings from Last 3 Encounters:  11/02/22 206 lb 12.8 oz (93.8 kg)  10/13/22 206 lb (93.4 kg)  09/27/22 206 lb 3.2 oz (93.5 kg)    Lab Results  Component Value Date   TSH 2.951 10/13/2022   Lab Results  Component Value Date   WBC 4.6 10/13/2022   HGB 11.3 (L) 10/13/2022   HCT 34.5 (L) 10/13/2022   MCV 86.7 10/13/2022   PLT 160 10/13/2022   Lab Results  Component Value Date   NA 133 (L) 10/13/2022   K 4.1 10/13/2022   CO2 25 10/13/2022   GLUCOSE 122 (H) 10/13/2022   BUN 19 10/13/2022   CREATININE 0.94  10/13/2022   BILITOT 0.3 09/27/2022   ALKPHOS 86 09/27/2022   AST 21 09/27/2022   ALT 27 09/27/2022   PROT 7.9 09/27/2022   ALBUMIN 3.9 09/27/2022   CALCIUM 9.0 10/13/2022   ANIONGAP 7 10/13/2022   EGFR 62 09/27/2022   Lab Results  Component Value Date   CHOL 76 03/14/2020   Lab Results  Component Value Date   HDL 33 (L) 03/14/2020   Lab Results  Component Value Date   LDLCALC 27 03/14/2020   Lab Results  Component Value Date   TRIG 75 03/14/2020   Lab Results  Component Value Date   CHOLHDL 2.3 03/14/2020   Lab Results  Component Value Date   HGBA1C 6.6 (H) 09/27/2022      Assessment & Plan:   Problem List Items Addressed This Visit       Cardiovascular and Mediastinum   HTN (hypertension)    Well-controlled with Losartan, Coreg, Spironolactone  and Lasix      Chronic combined systolic and diastolic CHF (congestive heart failure) (HCC)    On ARB, Spironolactone and Lasix Appears euvolemic currently        Endocrine   Type 2 diabetes mellitus with diabetic neuropathy ([place]) - Primary    Lab Results  Component Value Date   HGBA1C 6.6 (H) 09/27/2022  Well-controlled On Lantus 24 U BID and Novolog 6 U TID with meals Decreased Lantus to 24 units twice daily in the last visit due to episodes of hypoglycemia, which have resolved now Switched to Lantus and NovoLog pens for easier access Had referred to endocrinology Advised to follow diabetic diet On statin F/u CMP and lipid panel Diabetic eye exam: Advised to follow up with Ophthalmology for diabetic eye exam  Avoid skipping any meal. Advised to keep snacks or orange juice accessible most of the time.      Relevant Medications   insulin glargine (LANTUS SOLOSTAR) 100 UNIT/ML Solostar Pen   insulin aspart (NOVOLOG FLEXPEN) 100 UNIT/ML FlexPen   Other Relevant Orders   CMP14+EGFR   Hemoglobin A1c   Hypothyroidism    Lab Results  Component Value Date   TSH 2.951 10/13/2022   On Levothyroxine 50  mcg QD Her fatigue and decreased appetite could be due to hypothyroidism as well, she has been taking levothyroxine inconsistently Advised for compliance Follow up TSH and free T4      Relevant Orders   TSH + free T4   Other Visit Diagnoses     Need for immunization against influenza       Relevant Orders   Flu Vaccine QUAD High Dose(Fluad) (Completed)      Meds ordered this encounter  Medications   DISCONTD: insulin glargine (LANTUS SOLOSTAR) 100 UNIT/ML Solostar Pen    Sig: Inject 24 Units into the skin daily.    Dispense:  21 mL    Refill:  3   insulin glargine (LANTUS SOLOSTAR) 100 UNIT/ML Solostar Pen    Sig: Inject 24 Units into the skin daily.    Dispense:  21 mL    Refill:  3   insulin aspart (NOVOLOG FLEXPEN) 100 UNIT/ML FlexPen    Sig: Inject 6 Units into the skin 3 (three) times daily with meals.    Dispense:  15 mL    Refill:  3     Follow-up: Return in about 3 months (around 01/31/2023) for DM and HTN.    Lindell Spar, MD

## 2022-11-02 NOTE — Assessment & Plan Note (Addendum)
Lab Results  Component Value Date   HGBA1C 6.6 (H) 09/27/2022   Well-controlled On Lantus 24 U BID and Novolog 6 U TID with meals Decreased Lantus to 24 units twice daily in the last visit due to episodes of hypoglycemia, which have resolved now Switched to Lantus and NovoLog pens for easier access Had referred to endocrinology Advised to follow diabetic diet On statin F/u CMP and lipid panel Diabetic eye exam: Advised to follow up with Ophthalmology for diabetic eye exam  Avoid skipping any meal. Advised to keep snacks or orange juice accessible most of the time.

## 2022-11-02 NOTE — Assessment & Plan Note (Addendum)
Lab Results  Component Value Date   TSH 2.951 10/13/2022    On Levothyroxine 50 mcg QD Her fatigue and decreased appetite could be due to hypothyroidism as well, she has been taking levothyroxine inconsistently Advised for compliance Follow up TSH and free T4

## 2022-11-02 NOTE — Assessment & Plan Note (Signed)
On ARB, Spironolactone and Lasix Appears euvolemic currently

## 2022-11-02 NOTE — Assessment & Plan Note (Signed)
Well-controlled with Losartan, Coreg, Spironolactone and Lasix

## 2022-11-02 NOTE — Patient Instructions (Addendum)
Please take Lantus 24 units twice daily and Novolog 6 units 3 times daily.  Please continue taking medications as prescribed.  Please continue to follow low carb diet and ambulate as tolerated.  Please consider getting Shingrix and Tdap vaccines at local pharmacy.  Please get fasting blood tests done before the next visit.

## 2022-11-08 ENCOUNTER — Ambulatory Visit: Payer: Medicare HMO

## 2022-11-08 DIAGNOSIS — I255 Ischemic cardiomyopathy: Secondary | ICD-10-CM | POA: Diagnosis not present

## 2022-11-08 LAB — CUP PACEART REMOTE DEVICE CHECK
Battery Remaining Longevity: 32 mo
Battery Remaining Percentage: 42 %
Battery Voltage: 2.93 V
Brady Statistic AP VP Percent: 1 %
Brady Statistic AP VS Percent: 1 %
Brady Statistic AS VP Percent: 98 %
Brady Statistic AS VS Percent: 1 %
Brady Statistic RA Percent Paced: 1 %
Date Time Interrogation Session: 20240219020015
HighPow Impedance: 61 Ohm
HighPow Impedance: 61 Ohm
Implantable Lead Connection Status: 753985
Implantable Lead Connection Status: 753985
Implantable Lead Connection Status: 753985
Implantable Lead Implant Date: 20200813
Implantable Lead Implant Date: 20200813
Implantable Lead Implant Date: 20200813
Implantable Lead Location: 753858
Implantable Lead Location: 753859
Implantable Lead Location: 753860
Implantable Pulse Generator Implant Date: 20200813
Lead Channel Impedance Value: 380 Ohm
Lead Channel Impedance Value: 390 Ohm
Lead Channel Impedance Value: 400 Ohm
Lead Channel Pacing Threshold Amplitude: 0.625 V
Lead Channel Pacing Threshold Amplitude: 0.75 V
Lead Channel Pacing Threshold Amplitude: 1.75 V
Lead Channel Pacing Threshold Pulse Width: 0.4 ms
Lead Channel Pacing Threshold Pulse Width: 0.5 ms
Lead Channel Pacing Threshold Pulse Width: 0.6 ms
Lead Channel Sensing Intrinsic Amplitude: 12 mV
Lead Channel Sensing Intrinsic Amplitude: 5 mV
Lead Channel Setting Pacing Amplitude: 2 V
Lead Channel Setting Pacing Amplitude: 2 V
Lead Channel Setting Pacing Amplitude: 2.5 V
Lead Channel Setting Pacing Pulse Width: 0.5 ms
Lead Channel Setting Pacing Pulse Width: 0.6 ms
Lead Channel Setting Sensing Sensitivity: 0.5 mV
Pulse Gen Serial Number: 9891600
Zone Setting Status: 755011

## 2022-11-09 ENCOUNTER — Ambulatory Visit: Payer: Medicare HMO | Attending: Cardiovascular Disease

## 2022-11-09 DIAGNOSIS — Z95810 Presence of automatic (implantable) cardiac defibrillator: Secondary | ICD-10-CM

## 2022-11-09 DIAGNOSIS — I5022 Chronic systolic (congestive) heart failure: Secondary | ICD-10-CM | POA: Diagnosis not present

## 2022-11-09 NOTE — Progress Notes (Signed)
EPIC Encounter for ICM Monitoring  Patient Name: [name] is a 72 y.o. female Date: 11/09/2022 Primary Care Physican: Lindell Spar, MD Primary Cardiologist: Branch Electrophysiologist: Mealor Bi-V Pacing:  98%        01/22/2022 Weight: 223 lbs 04/01/2022 Weight: 223 lbs 05/06/2022 Weight: 221 lbs 05/17/2022 Weight: 216 lbs 08/27/2022 Weight: 216 lbs 11/12/2022 Weight: 206 lbs                                                        Spoke with patient and heart failure questions reviewed.  Transmission results reviewed.  Pt asymptomatic for fluid accumulation.  She had ER visit on 1/24 due to respiratory infection, dehydration and weakness.     [place] Thoracic impedance suggesting normal fluid levels with the exception of 1/25-2/3 possible fluid accumulation which correlates with pushing fluids after ER on 1/24.     Prescribed:  Furosemide 20 mg take 1 tablet by mouth daily.   Spironolactone 25 mg take 1 tablet daily. Jardiance 10 mg take 1 tablet by mouth before breakfast   Labs: 09/27/2022 Creatinine 0.96, BUN 24, Potassium 5.0, Sodium 137, GFR 62 05/04/2022 Creatinine 1.06, BUN 27, Potassium 5.0, Sodium 139, GFR 55 09/25/2021 Creatinine 0.94, BUN 13, Potassium 4.6, Sodium 140, GFR 64 A complete set of results can be found in Results Review.   Recommendations:  No changes and encouraged to call if experiencing any fluid symptoms.   Follow-up plan: ICM clinic phone appointment on 12/13/2022.    91 day device clinic remote transmission 02/07/2023.        EP/Cardiology Office Visits:   11/30/2022 with Dr Harl Bowie.  11/19/2022 with Dr Myles Gip.   Copy of ICM check sent to Dr. Myles Gip.    3 month ICM trend: 11/09/2022.    12-14 Month ICM trend:     [name], RN 11/09/2022 8:16 AM

## 2022-11-19 ENCOUNTER — Ambulatory Visit: Payer: Medicare HMO | Admitting: Cardiovascular Disease

## 2022-11-30 ENCOUNTER — Ambulatory Visit: Payer: Medicare HMO | Attending: Cardiology | Admitting: Cardiology

## 2022-11-30 ENCOUNTER — Encounter: Payer: Self-pay | Admitting: Cardiology

## 2022-11-30 VITALS — BP 126/70 | HR 84 | Ht 65.0 in | Wt 206.0 lb

## 2022-11-30 DIAGNOSIS — I2510 Atherosclerotic heart disease of native coronary artery without angina pectoris: Secondary | ICD-10-CM

## 2022-11-30 DIAGNOSIS — Z79899 Other long term (current) drug therapy: Secondary | ICD-10-CM

## 2022-11-30 DIAGNOSIS — E782 Mixed hyperlipidemia: Secondary | ICD-10-CM

## 2022-11-30 DIAGNOSIS — I5022 Chronic systolic (congestive) heart failure: Secondary | ICD-10-CM

## 2022-11-30 NOTE — Patient Instructions (Signed)
Medication Instructions:  Continue all current medications.  Labwork: FLP - order given today Reminder:  Nothing to eat or drink after 12 midnight prior to labs. Office will contact with results via phone, letter or mychart.     Testing/Procedures: none  Follow-Up: 6 months   Any Other Special Instructions Will Be Listed Below (If Applicable).   If you need a refill on your cardiac medications before your next appointment, please call your pharmacy.  

## 2022-11-30 NOTE — Progress Notes (Signed)
Clinical Summary Ms. Brungard is a 72 y.o.female seen today for follow up of the following meidcal problems.    1. Chronic combined systolic and diastolic heart failure - echo 03/2014 LVEF 25-30%, restrictive diastolic dysfunction. New diagnosis at that time. Repeat echo 06/2014 LVEF 40-45% - cath 03/2014 showed RCA 99% with left to right collaterals, other arteries patent. Overall LV systolic dysfunction out of proportion to CAD.  Medically managed.  - 07/2015 echo LVEF [id number], grade I diastolic dysfunction   - Entresto causes dizziness and nausea, stopped taking. Back on losartan   -  due to low bp's we lowered losartan to '25mg'$  daily. Lowered lasix to '20mg'$  daily, may take '40mg'$  as needed.    01/2018 echo LVEF 35%, grade I diastoilc dysfunction  - 11/2018 echo LVEF 30-35% - 05/03/19 had BIV AICD placed by Dr Myles Gip   11/2019 LVEF 30-35% 07/2021 barostim implant  01/2022 echo: LVEF 40-45%, grade I dd      - no SOB/DOE. No recent edema - clinic weights stable 206 lbs, home scale 204-206.  - compliant with meds   2. CAD - cath 03/2014  with 99% chronic RCA disease, otherwise patent vessels. Managed medically - no recent chest pains.    3. OSA  - on bipap at night. Followed by Dr Elsworth Soho   4. Hyperlipidemia -upcoming labs next month 02/2020 TC 76 HDL 33 TG 75 LDL 27 - upcoming labs with pcp   5. Chronic LBBB     6. Leg pains - back of thighs and calves with walking, resolves with rest - ABIs were normal but monophasic waveforms suggesting disease. Followed by vascular - 11/2021 CTA: 1. Atherosclerotic calcified and noncalcified plaque seen throughout the superficial femoral and popliteal arteries without high-grade stenoses. 2. Left posterior tibial artery is occluded at the mid lower leg. Two vessel runoff to the left ankle. Three-vessel runoff to the right ankle. Past Medical History:  Diagnosis Date   Anemia    Arthritis    CAD (coronary artery disease)    a. LHC (03/2014)  [place]: nl, LAD: diff dz proximal 20%, 30-40% dz mid vessel prior 2nd diagonal, LCx: 40% in OM1, 20-30% in OM2, RCA: 99% subtotal occlusion @ crux, TIMI 1 flow, L-R collaterals to distal vessel   Cardiomyopathy ([place]) 03/28/2014   LV dysfunction out of proportion to CAD 03/28/14   CHF (congestive heart failure) ([place])    Phreesia [id number]   Chronic systolic heart failure ([place])    a. ECHO (03/2014): EF 25-30%, akinesis enteroanteroseptal myocardium, grade III DD b. RHC (03/2014) RA 4, RV 42/4, PA 44/14 (26), PCWP 21, PA 62% Fick CO/CI 6.9 / 3.4   Diabetes mellitus    Diabetes mellitus without complication ([place])    Phreesia 10/03/2020   Duodenal ulcer    Age 66   Dyspnea    Erosive esophagitis    Gastritis    Hypertension    Hypothyroidism    Obese    Short-term memory loss    Thyroid disease    TTP (thrombotic thrombocytopenic purpura) ([place]) 06/2005     Allergies  Allergen Reactions   Other Other (See Comments)    FLAGYL, CIPRO, PROTONIX taken at the same time caused patient to lose her breath and have trouble breathing, requiring hospital stay at [facility], 03/23/14-per patient.    Lisinopril Cough     Current Outpatient Medications  Medication Sig Dispense Refill   acetaminophen (TYLENOL) 500 MG tablet Take 1,000 mg by  mouth every 6 (six) hours as needed for moderate pain.     aspirin 81 MG chewable tablet Chew 1 tablet (81 mg total) by mouth daily.     atorvastatin (LIPITOR) 80 MG tablet TAKE 1 TABLET EVERY DAY 90 tablet 2   blood glucose meter kit and supplies Dispense based on patient and insurance preference. Use up to four times daily as directed. (FOR ICD-10 E10.9, E11.9). 1 each 0   carvedilol (COREG) 12.5 MG tablet Take 1 tablet (12.5 mg total) by mouth 2 (two) times daily. 180 tablet 3   Ciclopirox 0.77 % gel APPLY ONE TIME DAILY ON THE AFFECTED TOE NAIL (Patient taking differently: Apply 1 application  topically 2 (two) times daily as needed (affected toe nail). APPLY ONE TIME  DAILY ON THE AFFECTED TOE NAIL.) 30 g 0   empagliflozin (JARDIANCE) 10 MG TABS tablet Take 1 tablet (10 mg total) by mouth daily before breakfast. 14 tablet 0   furosemide (LASIX) 20 MG tablet TAKE 1 TABLET EVERY DAY 90 tablet 3   insulin aspart (NOVOLOG FLEXPEN) 100 UNIT/ML FlexPen Inject 6 Units into the skin 3 (three) times daily with meals. 15 mL 3   insulin glargine (LANTUS SOLOSTAR) 100 UNIT/ML Solostar Pen Inject 24 Units into the skin daily. 21 mL 3   levothyroxine (SYNTHROID) 50 MCG tablet Take 1 tablet (50 mcg total) by mouth daily before breakfast. 90 tablet 1   losartan (COZAAR) 25 MG tablet Take 1 tablet (25 mg total) by mouth daily. 90 tablet 3   Multiple Vitamin (MULTIVITAMIN) tablet Take 1 tablet by mouth daily.     ondansetron (ZOFRAN) 4 MG tablet Take 1 tablet (4 mg total) by mouth every 6 (six) hours. 10 tablet 0   polyethylene glycol (MIRALAX / GLYCOLAX) packet Take 17 g by mouth daily as needed for mild constipation.      spironolactone (ALDACTONE) 25 MG tablet TAKE 1 TABLET EVERY DAY 90 tablet 2   TRUE METRIX BLOOD GLUCOSE TEST test strip USE AS DIRECTED TO TEST BLOOD SUGAR UP TO FOUR TIMES DAILY 300 strip 1   TRUEplus Lancets 33G MISC USE AS DIRECTED TO CHECK BLOOD SUGAR UP TO FOUR TIMES DAILY 400 each 1   No current facility-administered medications for this visit.     Past Surgical History:  Procedure Laterality Date   BIV ICD INSERTION CRT-D N/A 05/03/2019   Procedure: BIV ICD INSERTION CRT-D;  Surgeon: Thompson Grayer, MD;  Location: [facility];  Service: Cardiovascular;  Laterality: N/A;   CARDIAC CATHETERIZATION     COLONOSCOPY  08/29/2012   [id number] diverticulosis   COLONOSCOPY N/A 05/28/2015   Procedure: COLONOSCOPY;  Surgeon: Daneil Dolin, MD;  Location: [facility];  Service: Endoscopy;  Laterality: N/A;  1015   ECTOPIC PREGNANCY SURGERY     ESOPHAGOGASTRODUODENOSCOPY  04/20/2012   [id number] antral gastritis/Erosive reflux esophagitis    LEFT AND RIGHT HEART CATHETERIZATION WITH CORONARY ANGIOGRAM N/A 03/28/2014   Procedure: LEFT AND RIGHT HEART CATHETERIZATION WITH CORONARY ANGIOGRAM;  Surgeon: Peter M Martinique, MD;  Location: [facility];  Service: Cardiovascular;  Laterality: N/A;   SPLENECTOMY  07/20/2005     Allergies  Allergen Reactions   Other Other (See Comments)    FLAGYL, CIPRO, PROTONIX taken at the same time caused patient to lose her breath and have trouble breathing, requiring hospital stay at [facility], 03/23/14-per patient.    Lisinopril Cough      Family History  Problem Relation Age of  Onset   Heart failure Mother    Cirrhosis Father        ETOH   Heart disease Brother    Diabetes Daughter    Breast cancer Maternal Aunt    Colon cancer Neg Hx      Social History Ms. Travaglini reports that she quit smoking about 44 years ago. Her smoking use included cigarettes. She started smoking about 56 years ago. She has a 2.50 pack-year smoking history. She has never used smokeless tobacco. Ms. Hiemenz reports no history of alcohol use.   Review of Systems CONSTITUTIONAL: No weight loss, fever, chills, weakness or fatigue.  HEENT: Eyes: No visual loss, blurred vision, double vision or yellow sclerae.No hearing loss, sneezing, congestion, runny nose or sore throat.  SKIN: No rash or itching.  CARDIOVASCULAR: per hpi RESPIRATORY: No shortness of breath, cough or sputum.  GASTROINTESTINAL: No anorexia, nausea, vomiting or diarrhea. No abdominal pain or blood.  GENITOURINARY: No burning on urination, no polyuria NEUROLOGICAL: No headache, dizziness, syncope, paralysis, ataxia, numbness or tingling in the extremities. No change in bowel or bladder control.  MUSCULOSKELETAL: No muscle, back pain, joint pain or stiffness.  LYMPHATICS: No enlarged nodes. No history of splenectomy.  PSYCHIATRIC: No history of depression or anxiety.  ENDOCRINOLOGIC: No reports of sweating, cold or heat intolerance. No polyuria or polydipsia.   Marland Kitchen   Physical Examination Today's Vitals   11/30/22 1124  BP: 126/70  Pulse: 84  SpO2: 97%  Weight: 206 lb (93.4 kg)  Height: '5\' 5"'$  (1.651 m)   Body mass index is 34.28 kg/m.  Gen: resting comfortably, no acute distress HEENT: no scleral icterus, pupils equal round and reactive, no palptable cervical adenopathy,  CV: RRR, no m/rg, no jvd Resp: Clear to auscultation bilaterally GI: abdomen is soft, non-tender, non-distended, normal bowel sounds, no hepatosplenomegaly MSK: extremities are warm, no edema.  Skin: warm, no rash Neuro:  no focal deficits Psych: appropriate affect   Diagnostic Studies  03/2014 echo Study Conclusions  - Left ventricle: The cavity size was normal. There was mild focal basal hypertrophy of the septum. Systolic function was severely reduced. The estimated ejection fraction was in the range of 25% to 30%. There is akinesis of the entireanteroseptal myocardium. Doppler parameters are consistent with a reversible restrictive pattern, indicative of decreased left ventricular diastolic compliance and/or increased left atrial pressure (grade 3 diastolic dysfunction). - Mitral valve: There was mild regurgitation. - Left atrium: The atrium was moderately dilated.  03/2014 Cath Procedural Findings:   Hemodynamics   RA 8/0 mean 4 mm Hg   RV 42/4 mm Hg   PA 44/14 mean 26 mm Hg   PCWP 20/28 mean 21 mm Hg   LV 98/18 mm Hg   AO 95/54 mean 72 mm Hg   Oxygen saturations:   PA 62%   AO 90%   Cardiac Output (Fick) 6.9 L/min   Cardiac Index (Fick) 3.4 L/min/meter squared   Coronary angiography:   Coronary dominance: right   Left mainstem: Normal   Left anterior descending (LAD): Diffuse disease in the proximal vessel up to 20%. 30-40% disease in the mid vessel prior to the second diagonal.   Left circumflex (LCx): 40% in OM1. 20-30% in OM2. Otherwise no significant disease.   Right coronary artery (RCA): 99% subtotal occlusion at the crux. TIMI 1 flow.  Left to right collaterals to the distal vessel.   Left ventriculography: Left ventricular systolic function is abnormal, LVEF is estimated at 25%. There is  inferior akinesis and severe global hypokinesis. There is mild mitral regurgitation   Final Conclusions:   1. Single vessel obstructive CAD with subtotal occlusion of the RCA at the crux.   2. Severe LV dysfunction.   3. Normal Right heart pressures. Elevated PCWP.   Recommendations: Medical management. Her degree of LV dysfunction is out of proportion to her CAD. She has no anginal symptoms so PCI of RCA not indicated. The inferior wall appears scarred.   07/2015 echo Study Conclusions   - Left ventricle: The cavity size was at the upper limits of   normal. Wall thickness was increased in a pattern of moderate   LVH. The estimated ejection fraction was 40% (similar calculation   per biplane speckle tracking, reduced global longitudinal strain   of -14.3%). There is akinesis of the basalinferolateral and   inferior myocardium. Doppler parameters are consistent with   abnormal left ventricular relaxation (grade 1 diastolic   dysfunction). - Aortic valve: Mildly calcified annulus. Trileaflet. - Mitral valve: Calcified annulus. There was trivial regurgitation. - Left atrium: The atrium was at the upper limits of normal in   size. - Right atrium: Central venous pressure (est): 3 mm Hg. - Atrial septum: A patent foramen ovale cannot be excluded. - Tricuspid valve: There was trivial regurgitation. - Pulmonary arteries: Systolic pressure could not be accurately   estimated. - Pericardium, extracardiac: There was no pericardial effusion.   Impressions:   - Upper normal LV chamber size with moderate LVH and LVEF   approximately 40% as discussed above. There is akinesis of the   basal inferoseptal and inferior myocardium. Grade 1 diastolic   dysfunction. Compared to the previous study from October 2015,   LVEF is in similar range,  perhaps slightly reduced. Upper normal   left atrial chamber size. Trivial tricuspid regurgitation. Cannot   exclude PFO based on limited images.     01/2018 echo Study Conclusions   - Left ventricle: The cavity size was normal. Wall thickness was   increased in a pattern of mild LVH. The estimated ejection   fraction was 35%. There is akinesis of the basal-midinferior   myocardium. There is akinesis of the mid-apicalanteroseptal and   apical myocardium. Doppler parameters are consistent with   abnormal left ventricular relaxation (grade 1 diastolic   dysfunction). - Ventricular septum: Septal motion showed abnormal function and   dyssynergy suggestive of left bundle Ericka Marcellus block. - Aortic valve: Moderately calcified annulus. Trileaflet. - Mitral valve: There was mild regurgitation. - Right atrium: Central venous pressure (est): 3 mm Hg. - Atrial septum: No defect or patent foramen ovale was identified. - Tricuspid valve: There was trivial regurgitation. - Pulmonary arteries: Systolic pressure could not be accurately   estimated. - Pericardium, extracardiac: There was no pericardial effusion     11/2018 echo 1. The left ventricle has moderate-severely reduced systolic function, with an ejection fraction of 30-35%. The cavity size was normal. There is mildly increased left ventricular wall thickness. Left ventricular diastolic Doppler parameters are  consistent with impaired relaxation. Elevated mean left atrial pressure.  2. The anteroseptal, anterolateral, apical walls are hypokinetic.  3. The right ventricle has normal systolic function. The cavity was normal. There is no increase in right ventricular wall thickness.  4. Left atrial size was moderately dilated.  5. No evidence of mitral valve stenosis.  6. The aortic valve is tricuspid no stenosis of the aortic valve.  7. Pulmonary hypertension is indeterminant, inadequate TR jet.  8. The  inferior vena cava was dilated in size  with <50% respiratory variability.  9. The interatrial septum was not well visualized.   05/2021 ABI FINDINGS: Right ABI:  1.22   Left ABI:  1.14   Right Lower Extremity:  Monophasic waveforms are seen.   Left Lower Extremity:  Monophasic waveforms are seen.   IMPRESSION: Although ABI values are within normal limits, bilateral monophasic waveforms are still suspicious for significant underlying arterial occlusive disease. Further evaluation with CT angiography of the abdominal aorta and bilateral lower extremity should be considered.         Assessment and Plan  1. Chronic combined systolic/diastolic HF - medical therapy limited by soft bp's. Did not tolerate entresto. She is on her maximally tolerated regimen. More recent issues with low bp's, coreg and lasix were lowered, bps have improved - s/p BIV AICD, barostim - no recent symptoms, she is euvolemic today - continue current meds   2. CAD - medically managed single vessel RCA disease - no symptoms, continue current meds  3. Hyperlipidemia - repeat lipid panel      Arnoldo Lenis, M.D.

## 2022-12-06 ENCOUNTER — Ambulatory Visit (INDEPENDENT_AMBULATORY_CARE_PROVIDER_SITE_OTHER): Payer: Medicare HMO

## 2022-12-06 DIAGNOSIS — Z0000 Encounter for general adult medical examination without abnormal findings: Secondary | ICD-10-CM

## 2022-12-06 NOTE — Progress Notes (Signed)
Subjective:   [name] is a 72 y.o. female who presents for Medicare Annual (Subsequent) preventive examination.  Review of Systems    I connected with  [name] on 12/06/22 by a audio enabled telemedicine application and verified that I am speaking with the correct person using two identifiers.  Patient Location: Home  Provider Location: Office/Clinic  I discussed the limitations of evaluation and management by telemedicine. The patient expressed understanding and agreed to proceed.        Objective:    There were no vitals filed for this visit. There is no height or weight on file to calculate BMI.     10/13/2022    9:26 AM 09/07/2021    2:35 PM 07/22/2021    9:30 AM 07/15/2021   11:28 AM 08/28/2020    8:40 AM 05/03/2019   11:42 AM 07/17/2017    5:04 PM  Advanced Directives  Does Patient Have a Medical Advance Directive? No No No No No No No  Would patient like information on creating a medical advance directive? No - Patient declined Yes (ED - Information included in AVS) No - Patient declined No - Patient declined Yes (MAU/Ambulatory/Procedural Areas - Information given) Yes (MAU/Ambulatory/Procedural Areas - Information given) No - Patient declined    Current Medications (verified) Outpatient Encounter Medications as of 12/06/2022  Medication Sig   acetaminophen (TYLENOL) 500 MG tablet Take 1,000 mg by mouth every 6 (six) hours as needed for moderate pain.   aspirin 81 MG chewable tablet Chew 1 tablet (81 mg total) by mouth daily.   atorvastatin (LIPITOR) 80 MG tablet TAKE 1 TABLET EVERY DAY   blood glucose meter kit and supplies Dispense based on patient and insurance preference. Use up to four times daily as directed. (FOR ICD-10 E10.9, E11.9).   carvedilol (COREG) 12.5 MG tablet Take 1 tablet (12.5 mg total) by mouth 2 (two) times daily.   Ciclopirox 0.77 % gel APPLY ONE TIME DAILY ON THE AFFECTED TOE NAIL (Patient taking differently: Apply 1 application   topically 2 (two) times daily as needed (affected toe nail). APPLY ONE TIME DAILY ON THE AFFECTED TOE NAIL.)   empagliflozin (JARDIANCE) 10 MG TABS tablet Take 1 tablet (10 mg total) by mouth daily before breakfast.   furosemide (LASIX) 20 MG tablet Take 20 mg by mouth every other day.   insulin aspart (NOVOLOG FLEXPEN) 100 UNIT/ML FlexPen Inject 6 Units into the skin 3 (three) times daily with meals.   insulin glargine (LANTUS SOLOSTAR) 100 UNIT/ML Solostar Pen Inject 24 Units into the skin daily.   levothyroxine (SYNTHROID) 50 MCG tablet Take 1 tablet (50 mcg total) by mouth daily before breakfast.   losartan (COZAAR) 25 MG tablet Take 1 tablet (25 mg total) by mouth daily.   Multiple Vitamin (MULTIVITAMIN) tablet Take 1 tablet by mouth daily.   ondansetron (ZOFRAN) 4 MG tablet Take 1 tablet (4 mg total) by mouth every 6 (six) hours.   polyethylene glycol (MIRALAX / GLYCOLAX) packet Take 17 g by mouth daily as needed for mild constipation.    spironolactone (ALDACTONE) 25 MG tablet TAKE 1 TABLET EVERY DAY   TRUE METRIX BLOOD GLUCOSE TEST test strip USE AS DIRECTED TO TEST BLOOD SUGAR UP TO FOUR TIMES DAILY   TRUEplus Lancets 33G MISC USE AS DIRECTED TO CHECK BLOOD SUGAR UP TO FOUR TIMES DAILY   No facility-administered encounter medications on file as of 12/06/2022.    Allergies (verified) Other and Lisinopril  History: Past Medical History:  Diagnosis Date   Anemia    Arthritis    CAD (coronary artery disease)    a. LHC (03/2014) [place]: nl, LAD: diff dz proximal 20%, 30-40% dz mid vessel prior 2nd diagonal, LCx: 40% in OM1, 20-30% in OM2, RCA: 99% subtotal occlusion @ crux, TIMI 1 flow, L-R collaterals to distal vessel   Cardiomyopathy ([place]) 03/28/2014   LV dysfunction out of proportion to CAD 03/28/14   CHF (congestive heart failure) ([place])    Phreesia [id number]   Chronic systolic heart failure ([place])    a. ECHO (03/2014): EF 25-30%, akinesis enteroanteroseptal myocardium, grade III DD  b. RHC (03/2014) RA 4, RV 42/4, PA 44/14 (26), PCWP 21, PA 62% Fick CO/CI 6.9 / 3.4   Diabetes mellitus    Diabetes mellitus without complication ([place])    Phreesia 10/03/2020   Duodenal ulcer    Age 62   Dyspnea    Erosive esophagitis    Gastritis    Hypertension    Hypothyroidism    Obese    Short-term memory loss    Thyroid disease    TTP (thrombotic thrombocytopenic purpura) ([place]) 06/2005   Past Surgical History:  Procedure Laterality Date   BIV ICD INSERTION CRT-D N/A 05/03/2019   Procedure: BIV ICD INSERTION CRT-D;  Surgeon: Thompson Grayer, MD;  Location: [facility];  Service: Cardiovascular;  Laterality: N/A;   CARDIAC CATHETERIZATION     COLONOSCOPY  08/29/2012   [id number] diverticulosis   COLONOSCOPY N/A 05/28/2015   Procedure: COLONOSCOPY;  Surgeon: Daneil Dolin, MD;  Location: [facility];  Service: Endoscopy;  Laterality: N/A;  1015   ECTOPIC PREGNANCY SURGERY     ESOPHAGOGASTRODUODENOSCOPY  04/20/2012   [id number] antral gastritis/Erosive reflux esophagitis   LEFT AND RIGHT HEART CATHETERIZATION WITH CORONARY ANGIOGRAM N/A 03/28/2014   Procedure: LEFT AND RIGHT HEART CATHETERIZATION WITH CORONARY ANGIOGRAM;  Surgeon: Peter M Martinique, MD;  Location: [facility];  Service: Cardiovascular;  Laterality: N/A;   SPLENECTOMY  07/20/2005   Family History  Problem Relation Age of Onset   Heart failure Mother    Cirrhosis Father        ETOH   Heart disease Brother    Diabetes Daughter    Breast cancer Maternal Aunt    Colon cancer Neg Hx    Social History   Socioeconomic History   Marital status: Divorced    Spouse name: Not on file   Number of children: 3   Years of education: 12   Highest education level: Some college, no degree  Occupational History   Occupation: disabled    Fish farm manager: NOT EMPLOYED  Tobacco Use   Smoking status: Former    Packs/day: 0.25    Years: 10.00    Additional pack years: 0.00    Total pack years: 2.50    Types:  Cigarettes    Start date: 09/20/1966    Quit date: 09/20/1978    Years since quitting: 44.2   Smokeless tobacco: Never  Vaping Use   Vaping Use: Never used  Substance and Sexual Activity   Alcohol use: No    Alcohol/week: 0.0 standard drinks of alcohol   Drug use: No   Sexual activity: Not Currently  Other Topics Concern   Not on file  Social History Narrative   Lives Alone       Social Determinants of Health   Financial Resource Strain: Low Risk  (09/07/2021)   Overall Financial Resource Strain (CARDIA)  Difficulty of Paying Living Expenses: Not very hard  Food Insecurity: Food Insecurity Present (09/07/2021)   Hunger Vital Sign    Worried About Running Out of Food in the Last Year: Sometimes true    Ran Out of Food in the Last Year: Sometimes true  Transportation Needs: No Transportation Needs (09/07/2021)   PRAPARE - Hydrologist (Medical): No    Lack of Transportation (Non-Medical): No  Physical Activity: Insufficiently Active (09/07/2021)   Exercise Vital Sign    Days of Exercise per Week: 2 days    Minutes of Exercise per Session: 20 min  Stress: No Stress Concern Present (09/07/2021)   [place]    Feeling of Stress : Not at all  Social Connections: Moderately Integrated (09/07/2021)   Social Connection and Isolation Panel [NHANES]    Frequency of Communication with Friends and Family: More than three times a week    Frequency of Social Gatherings with Friends and Family: Twice a week    Attends Religious Services: More than 4 times per year    Active Member of [employer] or Organizations: Yes    Attends Music therapist: More than 4 times per year    Marital Status: Divorced    Tobacco Counseling Counseling given: Not Answered   Clinical Intake:                 Diabetic?yes Nutrition Risk Assessment:  Has the patient had any N/V/D within the  last 2 months?  Yes  Does the patient have any non-healing wounds?  No  Has the patient had any unintentional weight loss or weight gain?  No   Diabetes:  Is the patient diabetic?  No  If diabetic, was a CBG obtained today?  No  Did the patient bring in their glucometer from home?  No  How often do you monitor your CBG's? 3 times daily.   Financial Strains and Diabetes Management:  Are you having any financial strains with the device, your supplies or your medication? No .  Does the patient want to be seen by Chronic Care Management for management of their diabetes?  No  Would the patient like to be referred to a Nutritionist or for Diabetic Management?  No   Diabetic Exams:  Diabetic Eye Exam: Overdue for diabetic eye exam. Pt has been advised about the importance in completing this exam. Patient advised to call and schedule an eye exam. Diabetic Foot Exam: Completed 11/02/22           Activities of Daily Living     No data to display           Patient Care Team: Lindell Spar, MD as PCP - General (Internal Medicine) Harl Bowie, Alphonse Guild, MD as PCP - Cardiology (Cardiology) Thompson Grayer, MD (Inactive) as PCP - Electrophysiology (Cardiology) Gala Romney Cristopher Estimable, MD as Attending Physician (Gastroenterology)  Indicate any recent Medical Services you may have received from other than [facility] providers in the past year (date may be approximate).     Assessment:   This is a routine wellness examination for Dorothie.  Hearing/Vision screen No results found.  Dietary issues and exercise activities discussed:     Goals Addressed   None   Depression Screen    11/02/2022    2:27 PM 09/27/2022   10:32 AM 09/07/2021    2:26 PM 05/08/2021   10:12 AM 12/02/2020   11:37 AM 10/06/2020  12:03 PM 08/28/2020    8:41 AM  PHQ 2/9 Scores  PHQ - 2 Score 0 0 0 0 0 0 0    Fall Risk    11/02/2022    2:27 PM 09/27/2022   10:31 AM 09/07/2021    2:37 PM 05/08/2021   10:12 AM 12/02/2020    11:37 AM  Fall Risk   Falls in the past year? 0 0 0 0 1  Number falls in past yr: 0 0 0 0 0  Injury with Fall? 0 0 0 0 0  Risk for fall due to :    No Fall Risks History of fall(s);Impaired balance/gait;Impaired mobility  Follow up   Falls evaluation completed Falls evaluation completed Falls evaluation completed    FALL RISK PREVENTION PERTAINING TO THE HOME:  Any stairs in or around the home? Yes  If so, are there any without handrails? No  Home free of loose throw rugs in walkways, pet beds, electrical cords, etc? Yes  Adequate lighting in your home to reduce risk of falls? Yes   ASSISTIVE DEVICES UTILIZED TO PREVENT FALLS:  Life alert? No  Use of a cane, walker or w/c? Yes  Grab bars in the bathroom? Yes  Shower chair or bench in shower? No  Elevated toilet seat or a handicapped toilet? Yes    Cognitive Function:    08/28/2020    8:42 AM  MMSE - Mini Mental State Exam  Not completed: Unable to complete        09/07/2021    2:39 PM 08/28/2020    8:42 AM  6CIT Screen  What Year? 0 points 0 points  What month? 0 points 0 points  What time? 0 points 0 points  Count back from 20 0 points 0 points  Months in reverse 0 points 0 points  Repeat phrase 0 points 0 points  Total Score 0 points 0 points    Immunizations Immunization History  Administered Date(s) Administered   Fluad Quad(high Dose 65+) 06/19/2020, 11/02/2022   Influenza,inj,Quad PF,6+ Mos 06/22/2017   Influenza-Unspecified 06/20/2014, 07/21/2018   Moderna Sars-Covid-2 Vaccination 12/01/2019, 01/02/2020   Pneumococcal Polysaccharide-23 04/19/2012    TDAP status: Due, Education has been provided regarding the importance of this vaccine. Advised may receive this vaccine at local pharmacy or Health Dept. Aware to provide a copy of the vaccination record if obtained from local pharmacy or Health Dept. Verbalized acceptance and understanding.  Flu Vaccine status: Up to date  Pneumococcal vaccine status:  Due, Education has been provided regarding the importance of this vaccine. Advised may receive this vaccine at local pharmacy or Health Dept. Aware to provide a copy of the vaccination record if obtained from local pharmacy or Health Dept. Verbalized acceptance and understanding.  Covid-19 vaccine status: Declined, Education has been provided regarding the importance of this vaccine but patient still declined. Advised may receive this vaccine at local pharmacy or Health Dept.or vaccine clinic. Aware to provide a copy of the vaccination record if obtained from local pharmacy or Health Dept. Verbalized acceptance and understanding.  Qualifies for Shingles Vaccine? Yes   Zostavax completed No   Shingrix Completed?: No.    Education has been provided regarding the importance of this vaccine. Patient has been advised to call insurance company to determine out of pocket expense if they have not yet received this vaccine. Advised may also receive vaccine at local pharmacy or Health Dept. Verbalized acceptance and understanding.  Screening Tests Health Maintenance  Topic Date Due  DTaP/Tdap/Td (1 - Tdap) Never done   Zoster Vaccines- Shingrix (1 of 2) Never done   Pneumonia Vaccine 44+ Years old (2 of 2 - PCV) 11/20/2013   COVID-19 Vaccine (3 - Moderna risk series) 01/30/2020   OPHTHALMOLOGY EXAM  02/17/2022   MAMMOGRAM  03/14/2022   HEMOGLOBIN A1C  03/28/2023   Diabetic kidney evaluation - Urine ACR  09/28/2023   Diabetic kidney evaluation - eGFR measurement  10/14/2023   FOOT EXAM  11/03/2023   Medicare Annual Wellness (AWV)  12/06/2023   COLONOSCOPY (Pts 45-22yrs Insurance coverage will need to be confirmed)  05/27/2025   INFLUENZA VACCINE  Completed   DEXA SCAN  Completed   Hepatitis C Screening  Completed   HPV VACCINES  Aged Out    Health Maintenance  Health Maintenance Due  Topic Date Due   DTaP/Tdap/Td (1 - Tdap) Never done   Zoster Vaccines- Shingrix (1 of 2) Never done    Pneumonia Vaccine 36+ Years old (2 of 2 - PCV) 11/20/2013   COVID-19 Vaccine (3 - Moderna risk series) 01/30/2020   OPHTHALMOLOGY EXAM  02/17/2022   MAMMOGRAM  03/14/2022    Colorectal cancer screening: Type of screening: Colonoscopy. Completed 05/28/15. Repeat every 10 years  Mammogram status: Ordered  . Pt provided with contact info and advised to call to schedule appt.   Bone Density status: Completed 05/22/21. Results reflect: Bone density results: NORMAL. Repeat every 2 years.  Lung Cancer Screening: (Low Dose CT Chest recommended if Age 73-80 years, 30 pack-year currently smoking OR have quit w/in 15years.) does not qualify.   Lung Cancer Screening Referral: no  Additional Screening:  Hepatitis C Screening: does qualify; Completed 03/14/20  Vision Screening: Recommended annual ophthalmology exams for early detection of glaucoma and other disorders of the eye. Is the patient up to date with their annual eye exam?  No  Who is the provider or what is the name of the office in which the patient attends annual eye exams? N/a If pt is not established with a provider, would they like to be referred to a provider to establish care? No .   Dental Screening: Recommended annual dental exams for proper oral hygiene  Community Resource Referral / Chronic Care Management: CRR required this visit?  No   CCM required this visit?  No      Plan:     I have personally reviewed and noted the following in the patient's chart:   Medical and social history Use of alcohol, tobacco or illicit drugs  Current medications and supplements including opioid prescriptions. Patient is not currently taking opioid prescriptions. Functional ability and status Nutritional status Physical activity Advanced directives List of other physicians Hospitalizations, surgeries, and ER visits in previous 12 months Vitals Screenings to include cognitive, depression, and falls Referrals and appointments  In  addition, I have reviewed and discussed with patient certain preventive protocols, quality metrics, and best practice recommendations. A written personalized care plan for preventive services as well as general preventive health recommendations were provided to patient.     Quentin Angst, [place]   12/06/2022

## 2022-12-13 ENCOUNTER — Ambulatory Visit: Payer: Medicare HMO | Attending: Cardiovascular Disease

## 2022-12-13 DIAGNOSIS — Z95810 Presence of automatic (implantable) cardiac defibrillator: Secondary | ICD-10-CM | POA: Diagnosis not present

## 2022-12-13 DIAGNOSIS — I5022 Chronic systolic (congestive) heart failure: Secondary | ICD-10-CM

## 2022-12-14 NOTE — Progress Notes (Signed)
EPIC Encounter for ICM Monitoring  Patient Name: [name] is a 72 y.o. female Date: 12/14/2022 Primary Care Physican: Lindell Spar, MD Primary Cardiologist: Branch Electrophysiologist: Mealor Bi-V Pacing:  98%        01/22/2022 Weight: 223 lbs 04/01/2022 Weight: 223 lbs 05/06/2022 Weight: 221 lbs 05/17/2022 Weight: 216 lbs 08/27/2022 Weight: 216 lbs 11/12/2022 Weight: 206 lbs                                                        Spoke with patient and heart failure questions reviewed.  Transmission results reviewed.  Pt reports tightness in ankle and generally not feeling well over the weekend but feeling better today.     Corvue Thoracic impedance suggesting possible fluid accumulation starting 3/22 and returning to baseline.     Prescribed:  Furosemide 20 mg take 1 tablet by mouth daily.   Spironolactone 25 mg take 1 tablet daily. Jardiance 10 mg take 1 tablet by mouth before breakfast   Labs: 10/13/2022 Creatinine 0.94, BUN 19, Potassium 4.1, Sodium 133, GFR >60 09/27/2022 Creatinine 0.96, BUN 24, Potassium 5.0, Sodium 137, GFR 62 05/04/2022 Creatinine 1.06, BUN 27, Potassium 5.0, Sodium 139, GFR 55 09/25/2021 Creatinine 0.94, BUN 13, Potassium 4.6, Sodium 140, GFR 64 A complete set of results can be found in Results Review.   Recommendations:  No changes but advised to limit salt intake and will recheck fluid levels next week.   Follow-up plan: ICM clinic phone appointment on 12/20/2022.    91 day device clinic remote transmission 02/07/2023.        EP/Cardiology Office Visits:   9/192/2024 with Dr Harl Bowie.  03/04/2023 with Dr Myles Gip.   Copy of ICM check sent to Dr. Myles Gip.     3 month ICM trend: 12/13/2022.    12-14 Month ICM trend:     [name], RN 12/14/2022 12:23 PM

## 2022-12-20 ENCOUNTER — Ambulatory Visit: Payer: Medicare HMO | Attending: Cardiovascular Disease

## 2022-12-20 DIAGNOSIS — I5022 Chronic systolic (congestive) heart failure: Secondary | ICD-10-CM

## 2022-12-20 DIAGNOSIS — Z95810 Presence of automatic (implantable) cardiac defibrillator: Secondary | ICD-10-CM

## 2022-12-21 NOTE — Progress Notes (Signed)
EPIC Encounter for ICM Monitoring  Patient Name: [name] is a 72 y.o. female Date: 12/21/2022 Primary Care Physican: Lindell Spar, MD Primary Cardiologist: Branch Electrophysiologist: Mealor Bi-V Pacing:  98%        01/22/2022 Weight: 223 lbs 04/01/2022 Weight: 223 lbs 05/06/2022 Weight: 221 lbs 05/17/2022 Weight: 216 lbs 08/27/2022 Weight: 216 lbs 11/12/2022 Weight: 206 lbs                                                        Spoke with patient and heart failure questions reviewed.  Transmission results reviewed.  Pt reports ankle swelling has resolved.  Discussed diet and fluid intake.  She is more than likely exceeding 2000 mg daily intake of salt.  Since she was in ER in March, she has been drinking electrolyte replacement drinks that are high in salt as well as no limiting dietary salt intake.    Corvue Thoracic impedance suggesting fluid levels returned to normal.     Prescribed:  Furosemide 20 mg take 1 tablet by mouth daily.   Spironolactone 25 mg take 1 tablet daily. Jardiance 10 mg take 1 tablet by mouth before breakfast   Labs: 10/13/2022 Creatinine 0.94, BUN 19, Potassium 4.1, Sodium 133, GFR >60 09/27/2022 Creatinine 0.96, BUN 24, Potassium 5.0, Sodium 137, GFR 62 05/04/2022 Creatinine 1.06, BUN 27, Potassium 5.0, Sodium 139, GFR 55 09/25/2021 Creatinine 0.94, BUN 13, Potassium 4.6, Sodium 140, GFR 64 A complete set of results can be found in Results Review.   Recommendations:  Discussed avoiding electrolyte replacement drinks unless advised by physician.  Recommendation to limit salt intake to 2000 mg daily and fluid intake to 64 oz daily.  Encouraged to call if experiencing any fluid symptoms.      Follow-up plan: ICM clinic phone appointment on 01/17/2023.    91 day device clinic remote transmission 02/07/2023.        EP/Cardiology Office Visits:   06/09/2023 with Dr Harl Bowie.  03/04/2023 with Dr Myles Gip.   Copy of ICM check sent to Dr. Myles Gip.    3 month ICM  trend: 01/17/2023.    12-14 Month ICM trend:     [name], RN 12/21/2022 2:35 PM

## 2022-12-22 NOTE — Progress Notes (Signed)
Remote ICD transmission.   

## 2022-12-23 ENCOUNTER — Other Ambulatory Visit: Payer: Self-pay | Admitting: Cardiology

## 2022-12-23 ENCOUNTER — Other Ambulatory Visit: Payer: Self-pay | Admitting: Internal Medicine

## 2023-01-10 DIAGNOSIS — H524 Presbyopia: Secondary | ICD-10-CM | POA: Diagnosis not present

## 2023-01-17 ENCOUNTER — Ambulatory Visit: Payer: Medicare HMO | Attending: Cardiovascular Disease

## 2023-01-17 DIAGNOSIS — I5022 Chronic systolic (congestive) heart failure: Secondary | ICD-10-CM | POA: Diagnosis not present

## 2023-01-17 DIAGNOSIS — Z95810 Presence of automatic (implantable) cardiac defibrillator: Secondary | ICD-10-CM | POA: Diagnosis not present

## 2023-01-18 NOTE — Progress Notes (Signed)
EPIC Encounter for ICM Monitoring  Patient Name: [name] is a 72 y.o. female Date: 01/18/2023 Primary Care Physican: Anabel Halon, MD Primary Cardiologist: Branch Electrophysiologist: Mealor Bi-V Pacing:  99%        11/12/2022 Weight: 206 lbs 01/18/2023 Weight: 206 lbs                                                        Spoke with patient and heart failure questions reviewed.  Transmission results reviewed.  Pt asymptomatic for fluid accumulation.  Reports feeling well at this time and voices no complaints.  She may have been drinking more fluid than normal and also had less urine output which has improved.   She also eating snacks higher in salt than she normally eats.     Corvue Thoracic impedance suggesting suggesting possible fluid accumulation starting 4/23.     Prescribed:  Furosemide 20 mg take 1 tablet by mouth every other day.   Spironolactone 25 mg take 1 tablet daily. Jardiance 10 mg take 1 tablet by mouth before breakfast   Labs: 10/13/2022 Creatinine 0.94, BUN 19, Potassium 4.1, Sodium 133, GFR >60 09/27/2022 Creatinine 0.96, BUN 24, Potassium 5.0, Sodium 137, GFR 62 05/04/2022 Creatinine 1.06, BUN 27, Potassium 5.0, Sodium 139, GFR 55 09/25/2021 Creatinine 0.94, BUN 13, Potassium 4.6, Sodium 140, GFR 64 A complete set of results can be found in Results Review.   Recommendations:  Recommendation to limit salt intake to 2000 mg daily and fluid intake to 64 oz daily.  Encouraged to call if experiencing any fluid symptoms.       Follow-up plan: ICM clinic phone appointment on 01/24/2023 to recheck fluid levels.    91 day device clinic remote transmission 02/07/2023.        EP/Cardiology Office Visits:   06/09/2023 with Dr Wyline Mood.  03/04/2023 with Dr Nelly Laurence.   Copy of ICM check sent to Dr. Nelly Laurence.    3 month ICM trend: 01/17/2023.    12-14 Month ICM trend:     Karie Soda, RN 01/18/2023 12:13 PM

## 2023-01-24 ENCOUNTER — Ambulatory Visit: Payer: Medicare HMO | Attending: Cardiovascular Disease

## 2023-01-24 DIAGNOSIS — I5022 Chronic systolic (congestive) heart failure: Secondary | ICD-10-CM

## 2023-01-24 DIAGNOSIS — Z95810 Presence of automatic (implantable) cardiac defibrillator: Secondary | ICD-10-CM

## 2023-01-24 NOTE — Progress Notes (Unsigned)
EPIC Encounter for ICM Monitoring  Patient Name: [name] is a 72 y.o. female Date: 01/24/2023 Primary Care Physican: Anabel Halon, MD Primary Cardiologist: Branch Electrophysiologist: Mealor Bi-V Pacing:  99%        11/12/2022 Weight: 206 lbs 01/18/2023 Weight: 206 lbs                                                        Spoke with patient and heart failure questions reviewed.  Transmission results reviewed.  Pt asymptomatic for fluid accumulation.  Reports feeling well at this time and voices no complaints.    Corvue Thoracic impedance suggesting fluid levels returned to normal after adjusting diet and limiting salt intake.     Prescribed:  Furosemide 20 mg take 1 tablet by mouth every other day.   Spironolactone 25 mg take 1 tablet daily. Jardiance 10 mg take 1 tablet by mouth before breakfast   Labs: 10/13/2022 Creatinine 0.94, BUN 19, Potassium 4.1, Sodium 133, GFR >60 09/27/2022 Creatinine 0.96, BUN 24, Potassium 5.0, Sodium 137, GFR 62 05/04/2022 Creatinine 1.06, BUN 27, Potassium 5.0, Sodium 139, GFR 55 09/25/2021 Creatinine 0.94, BUN 13, Potassium 4.6, Sodium 140, GFR 64 A complete set of results can be found in Results Review.   Recommendations:    Encouraged to call if experiencing any fluid symptoms.       Follow-up plan: ICM clinic phone appointment on 02/21/2023.    91 day device clinic remote transmission 02/07/2023.        EP/Cardiology Office Visits:   06/09/2023 with Dr Wyline Mood.  03/04/2023 with Dr Nelly Laurence.   Copy of ICM check sent to Dr. Nelly Laurence.    3 month ICM trend: 01/24/2023.    12-14 Month ICM trend:     Karie Soda, RN 01/24/2023 9:30 AM

## 2023-01-31 ENCOUNTER — Encounter: Payer: Self-pay | Admitting: Internal Medicine

## 2023-01-31 ENCOUNTER — Ambulatory Visit (INDEPENDENT_AMBULATORY_CARE_PROVIDER_SITE_OTHER): Payer: Medicare HMO | Admitting: Internal Medicine

## 2023-01-31 VITALS — BP 123/78 | HR 91 | Ht 65.0 in | Wt 205.0 lb

## 2023-01-31 DIAGNOSIS — I5042 Chronic combined systolic (congestive) and diastolic (congestive) heart failure: Secondary | ICD-10-CM | POA: Diagnosis not present

## 2023-01-31 DIAGNOSIS — E1140 Type 2 diabetes mellitus with diabetic neuropathy, unspecified: Secondary | ICD-10-CM

## 2023-01-31 DIAGNOSIS — I10 Essential (primary) hypertension: Secondary | ICD-10-CM | POA: Diagnosis not present

## 2023-01-31 DIAGNOSIS — Z794 Long term (current) use of insulin: Secondary | ICD-10-CM | POA: Diagnosis not present

## 2023-01-31 DIAGNOSIS — E782 Mixed hyperlipidemia: Secondary | ICD-10-CM

## 2023-01-31 DIAGNOSIS — E039 Hypothyroidism, unspecified: Secondary | ICD-10-CM

## 2023-01-31 LAB — POCT GLYCOSYLATED HEMOGLOBIN (HGB A1C): HbA1c, POC (controlled diabetic range): 7.1 % — AB (ref 0.0–7.0)

## 2023-01-31 MED ORDER — INSULIN PEN NEEDLE 31G X 5 MM MISC: 5 refills | Status: DC

## 2023-01-31 MED ORDER — GLIPIZIDE 5 MG PO TABS: 5.0000 mg | ORAL_TABLET | Freq: Two times a day (BID) | ORAL | 3 refills | Status: DC

## 2023-01-31 NOTE — Assessment & Plan Note (Signed)
Well-controlled with Losartan, Coreg, Spironolactone and Lasix 

## 2023-01-31 NOTE — Assessment & Plan Note (Signed)
On ARB, Spironolactone and Lasix Appears euvolemic currently 

## 2023-01-31 NOTE — Progress Notes (Signed)
Established Patient Office Visit  Subjective:  Patient ID: [name], female    DOB: 12/12/1950  Age: 72 y.o. MRN: [id number]  CC:  Chief Complaint  Patient presents with   Diabetes    Three month follow up     HPI [name] is a 72 y.o. female with past medical history of chronic combined systolic and diastolic CHF, ischemic cardiomyopathy status post ICD placement, hypertension, hyperlipidemia, type 2 diabetes mellitus, obstructive sleep apnea on BiPAP at night, obesity, coronary artery disease, GERD and hypothyroidism who presents for f/u of her chronic medical conditions.  She reports chronic fatigue and visual disturbance -blurry vision.  Of note, she has a history of type II DM and has been to Ophthalmologist for it recently.  Denies any redness or discharge from the eyes.  Denies any eye pain currently.  Type II DM: She checks her blood glucose regularly -and states that it has been running between 90-135 most of the time.  She takes insulin regularly -Lantus 24 units twice daily and NovoLog 6 units 3 times daily now, but has to skip NovoLog multiple times as blood glucose remains below 100 around her mealtime. Last HbA1C was 6.6 in 01/24.  She has not noticed any episodes of hypoglycemia since decreasing the dose of Lantus in the last visit. She has chronic fatigue, but denies any polyuria or polyphagia.  HTN and CHF: Her blood pressure has been stable at home. Takes medications regularly. Patient denies headache, dizziness, chest pain.  She has intermittent leg swelling.  She has a AICD in place, for followed by cardiology and EP cardiology.  Hypothyroidism: She has been taking levothyroxine 50 mcg daily.  She has chronic fatigue, but denies any recent change in weight.  Gait imbalance: She has chronic balance problems.  She uses walker for support.  She also has history of diabetic neuropathy.   Past Medical History:  Diagnosis Date   Anemia    Arthritis    CAD  (coronary artery disease)    a. LHC (03/2014) Lmain: nl, LAD: diff dz proximal 20%, 30-40% dz mid vessel prior 2nd diagonal, LCx: 40% in OM1, 20-30% in OM2, RCA: 99% subtotal occlusion @ crux, TIMI 1 flow, L-R collaterals to distal vessel   Cardiomyopathy (HCC) 03/28/2014   LV dysfunction out of proportion to CAD 03/28/14   CHF (congestive heart failure) (HCC)    Phreesia 10/03/2020   Chronic systolic heart failure (HCC)    a. ECHO (03/2014): EF 25-30%, akinesis enteroanteroseptal myocardium, grade III DD b. RHC (03/2014) RA 4, RV 42/4, PA 44/14 (26), PCWP 21, PA 62% Fick CO/CI 6.9 / 3.4   Diabetes mellitus    Diabetes mellitus without complication (HCC)    Phreesia 10/03/2020   Duodenal ulcer    Age 18   Dyspnea    Erosive esophagitis    Gastritis    Hypertension    Hypothyroidism    Obese    Short-term memory loss    Thyroid disease    TTP (thrombotic thrombocytopenic purpura) (HCC) 06/2005    Past Surgical History:  Procedure Laterality Date   BIV ICD INSERTION CRT-D N/A 05/03/2019   Procedure: BIV ICD INSERTION CRT-D;  Surgeon: Hillis Range, MD;  Location: [facility] INVASIVE CV LAB;  Service: Cardiovascular;  Laterality: N/A;   CARDIAC CATHETERIZATION     COLONOSCOPY  08/29/2012   [id number] diverticulosis   COLONOSCOPY N/A 05/28/2015   Procedure: COLONOSCOPY;  Surgeon: Corbin Ade, MD;  Location: [facility]  ENDO SUITE;  Service: Endoscopy;  Laterality: N/A;  1015   ECTOPIC PREGNANCY SURGERY     ESOPHAGOGASTRODUODENOSCOPY  04/20/2012   [id number] antral gastritis/Erosive reflux esophagitis   LEFT AND RIGHT HEART CATHETERIZATION WITH CORONARY ANGIOGRAM N/A 03/28/2014   Procedure: LEFT AND RIGHT HEART CATHETERIZATION WITH CORONARY ANGIOGRAM;  Surgeon: Peter M Swaziland, MD;  Location: [facility] CATH LAB;  Service: Cardiovascular;  Laterality: N/A;   SPLENECTOMY  07/20/2005    Family History  Problem Relation Age of Onset   Heart failure Mother    Cirrhosis Father        ETOH   Heart disease  Brother    Diabetes Daughter    Breast cancer Maternal Aunt    Colon cancer Neg Hx     Social History   Socioeconomic History   Marital status: Divorced    Spouse name: Not on file   Number of children: 3   Years of education: 12   Highest education level: Some college, no degree  Occupational History   Occupation: disabled    Associate Professor: NOT EMPLOYED  Tobacco Use   Smoking status: Former    Packs/day: 0.25    Years: 10.00    Additional pack years: 0.00    Total pack years: 2.50    Types: Cigarettes    Start date: 09/20/1966    Quit date: 09/20/1978    Years since quitting: 44.4   Smokeless tobacco: Never  Vaping Use   Vaping Use: Never used  Substance and Sexual Activity   Alcohol use: No    Alcohol/week: 0.0 standard drinks of alcohol   Drug use: No   Sexual activity: Not Currently  Other Topics Concern   Not on file  Social History Narrative   Lives Alone       Social Determinants of Health   Financial Resource Strain: Low Risk  (12/06/2022)   Overall Financial Resource Strain (CARDIA)    Difficulty of Paying Living Expenses: Not hard at all  Food Insecurity: No Food Insecurity (12/06/2022)   Hunger Vital Sign    Worried About Running Out of Food in the Last Year: Never true    Ran Out of Food in the Last Year: Never true  Transportation Needs: No Transportation Needs (12/06/2022)   PRAPARE - Administrator, Civil Service (Medical): No    Lack of Transportation (Non-Medical): No  Physical Activity: Sufficiently Active (12/06/2022)   Exercise Vital Sign    Days of Exercise per Week: 5 days    Minutes of Exercise per Session: 30 min  Stress: No Stress Concern Present (12/06/2022)   [employer] of Occupational Health - Occupational Stress Questionnaire    Feeling of Stress : Not at all  Social Connections: Moderately Integrated (12/06/2022)   Social Connection and Isolation Panel [NHANES]    Frequency of Communication with Friends and Family: More  than three times a week    Frequency of Social Gatherings with Friends and Family: More than three times a week    Attends Religious Services: More than 4 times per year    Active Member of [employer] or Organizations: Yes    Attends Banker Meetings: More than 4 times per year    Marital Status: Divorced  Intimate Partner Violence: Not At Risk (12/06/2022)   Humiliation, Afraid, Rape, and Kick questionnaire    Fear of Current or Ex-Partner: No    Emotionally Abused: No    Physically Abused: No    Sexually Abused: No  Outpatient Medications Prior to Visit  Medication Sig Dispense Refill   acetaminophen (TYLENOL) 500 MG tablet Take 1,000 mg by mouth every 6 (six) hours as needed for moderate pain.     aspirin 81 MG chewable tablet Chew 1 tablet (81 mg total) by mouth daily.     atorvastatin (LIPITOR) 80 MG tablet TAKE 1 TABLET EVERY DAY 90 tablet 1   blood glucose meter kit and supplies Dispense based on patient and insurance preference. Use up to four times daily as directed. (FOR ICD-10 E10.9, E11.9). 1 each 0   carvedilol (COREG) 12.5 MG tablet Take 1 tablet (12.5 mg total) by mouth 2 (two) times daily. 180 tablet 3   Ciclopirox 0.77 % gel APPLY ONE TIME DAILY ON THE AFFECTED TOE NAIL (Patient taking differently: Apply 1 application  topically 2 (two) times daily as needed (affected toe nail). APPLY ONE TIME DAILY ON THE AFFECTED TOE NAIL.) 30 g 0   empagliflozin (JARDIANCE) 10 MG TABS tablet Take 1 tablet (10 mg total) by mouth daily before breakfast. 14 tablet 0   furosemide (LASIX) 20 MG tablet Take 20 mg by mouth every other day.     insulin glargine (LANTUS SOLOSTAR) 100 UNIT/ML Solostar Pen Inject 24 Units into the skin daily. 21 mL 3   levothyroxine (SYNTHROID) 50 MCG tablet Take 1 tablet (50 mcg total) by mouth daily before breakfast. 90 tablet 1   losartan (COZAAR) 25 MG tablet TAKE 1 TABLET EVERY DAY 90 tablet 1   Multiple Vitamin (MULTIVITAMIN) tablet Take 1 tablet by  mouth daily.     ondansetron (ZOFRAN) 4 MG tablet Take 1 tablet (4 mg total) by mouth every 6 (six) hours. 10 tablet 0   polyethylene glycol (MIRALAX / GLYCOLAX) packet Take 17 g by mouth daily as needed for mild constipation.      spironolactone (ALDACTONE) 25 MG tablet TAKE 1 TABLET EVERY DAY 90 tablet 1   TRUE METRIX BLOOD GLUCOSE TEST test strip USE AS DIRECTED TO TEST BLOOD SUGAR UP TO FOUR TIMES DAILY 400 strip 3   TRUEplus Lancets 33G MISC USE AS DIRECTED TO CHECK BLOOD SUGAR UP TO FOUR TIMES DAILY 400 each 1   insulin aspart (NOVOLOG FLEXPEN) 100 UNIT/ML FlexPen Inject 6 Units into the skin 3 (three) times daily with meals. 15 mL 3   No facility-administered medications prior to visit.    Allergies  Allergen Reactions   Other Other (See Comments)    FLAGYL, CIPRO, PROTONIX taken at the same time caused patient to lose her breath and have trouble breathing, requiring hospital stay at [facility], 03/23/14-per patient.    Lisinopril Cough    ROS Review of Systems  Constitutional:  Positive for fatigue. Negative for chills and fever.  HENT:  Negative for congestion and sore throat.   Eyes:  Positive for visual disturbance. Negative for pain and discharge.  Respiratory:  Negative for cough and shortness of breath.   Cardiovascular:  Positive for leg swelling. Negative for chest pain and palpitations.  Gastrointestinal:  Negative for abdominal pain, diarrhea, nausea and vomiting.  Endocrine: Negative for polydipsia and polyuria.  Genitourinary:  Negative for dysuria and hematuria.  Musculoskeletal:  Positive for arthralgias and back pain. Negative for neck pain and neck stiffness.  Skin:  Negative for rash.  Neurological:  Positive for weakness. Negative for speech difficulty, numbness and headaches.  Psychiatric/Behavioral:  Negative for agitation and behavioral problems.       Objective:    Physical Exam  Vitals reviewed.  Constitutional:      General: She is not in acute distress.     Appearance: She is not diaphoretic.  HENT:     Head: Normocephalic and atraumatic.     Nose: Nose normal. No congestion.     Mouth/Throat:     Mouth: Mucous membranes are moist.     Pharynx: No posterior oropharyngeal erythema.  Eyes:     General: No scleral icterus.    Extraocular Movements: Extraocular movements intact.  Cardiovascular:     Rate and Rhythm: Normal rate and regular rhythm.     Pulses: Normal pulses.     Heart sounds: Normal heart sounds. No murmur heard. Pulmonary:     Breath sounds: Normal breath sounds. No wheezing or rales.  Abdominal:     Palpations: Abdomen is soft.     Tenderness: There is no abdominal tenderness.  Musculoskeletal:     Cervical back: Neck supple. No tenderness.     Right lower leg: No edema.     Left lower leg: No edema.  Skin:    General: Skin is warm.     Findings: No rash.  Neurological:     General: No focal deficit present.     Mental Status: She is alert and oriented to person, place, and time.     Sensory: No sensory deficit.     Motor: Weakness (B/l LE - 3/5) present.  Psychiatric:        Mood and Affect: Mood normal.        Behavior: Behavior normal.     BP 123/78 (BP Location: Left Arm, Patient Position: Sitting, Cuff Size: Large)   Pulse 91   Ht 5\' 5"  (1.651 m)   Wt 205 lb (93 kg)   SpO2 93%   BMI 34.11 kg/m  Wt Readings from Last 3 Encounters:  01/31/23 205 lb (93 kg)  11/30/22 206 lb (93.4 kg)  11/02/22 206 lb 12.8 oz (93.8 kg)    Lab Results  Component Value Date   TSH 2.951 10/13/2022   Lab Results  Component Value Date   WBC 4.6 10/13/2022   HGB 11.3 (L) 10/13/2022   HCT 34.5 (L) 10/13/2022   MCV 86.7 10/13/2022   PLT 160 10/13/2022   Lab Results  Component Value Date   NA 133 (L) 10/13/2022   K 4.1 10/13/2022   CO2 25 10/13/2022   GLUCOSE 122 (H) 10/13/2022   BUN 19 10/13/2022   CREATININE 0.94 10/13/2022   BILITOT 0.3 09/27/2022   ALKPHOS 86 09/27/2022   AST 21 09/27/2022   ALT 27  09/27/2022   PROT 7.9 09/27/2022   ALBUMIN 3.9 09/27/2022   CALCIUM 9.0 10/13/2022   ANIONGAP 7 10/13/2022   EGFR 62 09/27/2022   Lab Results  Component Value Date   CHOL 76 03/14/2020   Lab Results  Component Value Date   HDL 33 (L) 03/14/2020   Lab Results  Component Value Date   LDLCALC 27 03/14/2020   Lab Results  Component Value Date   TRIG 75 03/14/2020   Lab Results  Component Value Date   CHOLHDL 2.3 03/14/2020   Lab Results  Component Value Date   HGBA1C 7.1 (A) 01/31/2023      Assessment & Plan:   Problem List Items Addressed This Visit       Cardiovascular and Mediastinum   HTN (hypertension)    Well-controlled with Losartan, Coreg, Spironolactone and Lasix      Chronic combined systolic  and diastolic CHF (congestive heart failure) (HCC) - Primary    On ARB, Spironolactone and Lasix Appears euvolemic currently        Endocrine   Type 2 diabetes mellitus with diabetic neuropathy (HCC)    Lab Results  Component Value Date   HGBA1C 6.6 (H) 09/27/2022  Well-controlled On Lantus 24 U BID and Novolog 6 U TID with meals  Switched to Lantus pens for easier access, but could not start due to lack of pen needles, sent new prescription  Discontinue NovoLog as she has not required it a lot of times, switch to mealtime glipizide and strictly advised to take it just before meals Had referred to endocrinology Advised to follow diabetic diet On statin F/u CMP and lipid panel Diabetic eye exam: Advised to follow up with Ophthalmology for diabetic eye exam  Can consider Cymbalta for diabetic neuropathy, would also help with chronic fatigue - she prefers to discuss it later Avoid skipping any meal. Advised to keep snacks or orange juice accessible most of the time.      Relevant Medications   Insulin Pen Needle 31G X 5 MM MISC   glipiZIDE (GLUCOTROL) 5 MG tablet   Other Relevant Orders   POCT glycosylated hemoglobin (Hb A1C) (Completed)   CMP14+EGFR    Hemoglobin A1c   Hypothyroidism    Lab Results  Component Value Date   TSH 2.951 10/13/2022   On Levothyroxine 50 mcg QD Advised for compliance Follow up TSH and free T4      Other Visit Diagnoses     Mixed hyperlipidemia       Relevant Orders   Lipid Profile      Meds ordered this encounter  Medications   Insulin Pen Needle 31G X 5 MM MISC    Sig: Use as directed for Lantus and Novolog.    Dispense:  100 each    Refill:  5   glipiZIDE (GLUCOTROL) 5 MG tablet    Sig: Take 1 tablet (5 mg total) by mouth 2 (two) times daily before a meal.    Dispense:  60 tablet    Refill:  3    PLEASE DISCONTINUE NOVOLOG.     Follow-up: Return in about 3 months (around 05/03/2023) for DM and HLD.    Anabel Halon, MD

## 2023-01-31 NOTE — Patient Instructions (Signed)
Please stop taking Novolog. Start taking Glipizide before meals twice daily.  Please continue to take medications as prescribed.  Please continue to follow low carb diet and perform moderate exercise/walking at least 150 mins/week.  Please get fasting blood tests done before the next visit.

## 2023-01-31 NOTE — Assessment & Plan Note (Addendum)
Lab Results  Component Value Date   HGBA1C 6.6 (H) 09/27/2022   Well-controlled On Lantus 24 U BID and Novolog 6 U TID with meals  Switched to Lantus pens for easier access, but could not start due to lack of pen needles, sent new prescription  Discontinue NovoLog as she has not required it a lot of times, switch to mealtime glipizide and strictly advised to take it just before meals Had referred to endocrinology Advised to follow diabetic diet On statin F/u CMP and lipid panel Diabetic eye exam: Advised to follow up with Ophthalmology for diabetic eye exam  Can consider Cymbalta for diabetic neuropathy, would also help with chronic fatigue - she prefers to discuss it later Avoid skipping any meal. Advised to keep snacks or orange juice accessible most of the time.

## 2023-02-04 NOTE — Assessment & Plan Note (Signed)
Lab Results  Component Value Date   TSH 2.951 10/13/2022    On Levothyroxine 50 mcg QD Advised for compliance Follow up TSH and free T4

## 2023-02-07 ENCOUNTER — Ambulatory Visit: Payer: Medicare HMO

## 2023-02-21 ENCOUNTER — Ambulatory Visit: Payer: Medicare HMO | Attending: Cardiovascular Disease

## 2023-02-21 ENCOUNTER — Other Ambulatory Visit: Payer: Self-pay | Admitting: Internal Medicine

## 2023-02-21 DIAGNOSIS — Z95810 Presence of automatic (implantable) cardiac defibrillator: Secondary | ICD-10-CM

## 2023-02-21 DIAGNOSIS — I5022 Chronic systolic (congestive) heart failure: Secondary | ICD-10-CM | POA: Diagnosis not present

## 2023-02-21 DIAGNOSIS — E039 Hypothyroidism, unspecified: Secondary | ICD-10-CM

## 2023-02-24 NOTE — Progress Notes (Signed)
EPIC Encounter for ICM Monitoring  Patient Name: [name] is a 72 y.o. female Date: 02/24/2023 Primary Care Physican: Anabel Halon, MD Primary Cardiologist: Branch Electrophysiologist: Mealor Bi-V Pacing:  99%        11/12/2022 Weight: 206 lbs 01/18/2023 Weight: 206 lbs 02/24/2023 Weight: 206 lbs                                                        Spoke with patient and heart failure questions reviewed.  Transmission results reviewed.  Pt asymptomatic for fluid accumulation.  Reports feeling well at this time and voices no complaints.     Corvue Thoracic impedance suggesting normal fluid levels.     Prescribed:  Furosemide 20 mg take 1 tablet by mouth every other day.   Spironolactone 25 mg take 1 tablet daily.   Labs: 10/13/2022 Creatinine 0.94, BUN 19, Potassium 4.1, Sodium 133, GFR >60 09/27/2022 Creatinine 0.96, BUN 24, Potassium 5.0, Sodium 137, GFR 62 A complete set of results can be found in Results Review.   Recommendations:  No changes and encouraged to call if experiencing any fluid symptoms.      Follow-up plan: ICM clinic phone appointment on 03/28/2023.    91 day device clinic remote transmission 05/09/2023.        EP/Cardiology Office Visits:   06/09/2023 with Dr Wyline Mood.  03/04/2023 with Dr Nelly Laurence.   Copy of ICM check sent to Dr. Nelly Laurence.    3 month ICM trend: 02/21/2023.    12-14 Month ICM trend:     Karie Soda, RN 02/24/2023 11:02 AM

## 2023-03-04 ENCOUNTER — Encounter: Payer: Medicare HMO | Admitting: Cardiovascular Disease

## 2023-03-07 ENCOUNTER — Other Ambulatory Visit: Payer: Self-pay | Admitting: Internal Medicine

## 2023-03-28 ENCOUNTER — Ambulatory Visit: Payer: Medicare HMO | Attending: Cardiovascular Disease

## 2023-03-28 DIAGNOSIS — I5022 Chronic systolic (congestive) heart failure: Secondary | ICD-10-CM | POA: Diagnosis not present

## 2023-03-28 DIAGNOSIS — Z95810 Presence of automatic (implantable) cardiac defibrillator: Secondary | ICD-10-CM

## 2023-03-29 ENCOUNTER — Encounter: Payer: Self-pay | Admitting: Cardiovascular Disease

## 2023-03-29 ENCOUNTER — Ambulatory Visit: Payer: Medicare HMO | Admitting: Cardiovascular Disease

## 2023-03-29 VITALS — BP 120/74 | HR 96 | Ht 65.0 in | Wt 202.2 lb

## 2023-03-29 DIAGNOSIS — I255 Ischemic cardiomyopathy: Secondary | ICD-10-CM | POA: Diagnosis not present

## 2023-03-29 DIAGNOSIS — I5022 Chronic systolic (congestive) heart failure: Secondary | ICD-10-CM

## 2023-03-29 LAB — CUP PACEART INCLINIC DEVICE CHECK
Battery Remaining Longevity: 27 mo
Brady Statistic RA Percent Paced: 0.01 %
Brady Statistic RV Percent Paced: 99 %
Date Time Interrogation Session: 20240709125635
HighPow Impedance: 67.5 Ohm
Implantable Lead Connection Status: 753985
Implantable Lead Connection Status: 753985
Implantable Lead Connection Status: 753985
Implantable Lead Implant Date: 20200813
Implantable Lead Implant Date: 20200813
Implantable Lead Implant Date: 20200813
Implantable Lead Location: 753858
Implantable Lead Location: 753860
Implantable Lead Location: 753859
Implantable Pulse Generator Implant Date: 20200813
Lead Channel Impedance Value: 412.5 Ohm
Lead Channel Impedance Value: 412.5 Ohm
Lead Channel Impedance Value: 487.5 Ohm
Lead Channel Pacing Threshold Amplitude: 0.5 V
Lead Channel Pacing Threshold Amplitude: 0.5 V
Lead Channel Pacing Threshold Amplitude: 0.5 V
Lead Channel Pacing Threshold Amplitude: 0.5 V
Lead Channel Pacing Threshold Amplitude: 1.25 V
Lead Channel Pacing Threshold Amplitude: 1.25 V
Lead Channel Pacing Threshold Amplitude: 1.25 V
Lead Channel Pacing Threshold Pulse Width: 0.4 ms
Lead Channel Pacing Threshold Pulse Width: 0.4 ms
Lead Channel Pacing Threshold Pulse Width: 0.5 ms
Lead Channel Pacing Threshold Pulse Width: 0.5 ms
Lead Channel Pacing Threshold Pulse Width: 0.8 ms
Lead Channel Pacing Threshold Pulse Width: 1 ms
Lead Channel Pacing Threshold Pulse Width: 1 ms
Lead Channel Sensing Intrinsic Amplitude: 11.8 mV
Lead Channel Sensing Intrinsic Amplitude: 5 mV
Lead Channel Setting Pacing Amplitude: 2 V
Lead Channel Setting Pacing Amplitude: 2 V
Lead Channel Setting Pacing Amplitude: 2.5 V
Lead Channel Setting Pacing Pulse Width: 0.8 ms
Lead Channel Setting Pacing Pulse Width: 0.5 ms
Lead Channel Setting Sensing Sensitivity: 0.5 mV
Pulse Gen Serial Number: 9891600
Zone Setting Status: 755011

## 2023-03-29 NOTE — Patient Instructions (Signed)
Medication Instructions:  Your physician recommends that you continue on your current medications as directed. Please refer to the Current Medication list given to you today. *If you need a refill on your cardiac medications before your next appointment, please call your pharmacy*   Follow-Up: At [facility], you and your health needs are our priority.  As part of our continuing mission to provide you with exceptional heart care, we have created designated Provider Care Teams.  These Care Teams include your primary Cardiologist (physician) and Advanced Practice Providers (APPs -  Physician Assistants and Nurse Practitioners) who all work together to provide you with the care you need, when you need it.  We recommend signing up for the patient portal called "MyChart".  Sign up information is provided on this After Visit Summary.  MyChart is used to connect with patients for Virtual Visits (Telemedicine).  Patients are able to view lab/test results, encounter notes, upcoming appointments, etc.  Non-urgent messages can be sent to your provider as well.   To learn more about what you can do with MyChart, go to [email or web address].    Your next appointment:   6 month(s)  Provider:   You will see one of the following Advanced Practice Providers on your designated Care Team:   Francis Dowse, [place] [address] [place], [place] Canary Brim, NP

## 2023-03-29 NOTE — Progress Notes (Signed)
  Electrophysiology Office Note:    Date:  03/29/2023   ID:  [name], DOB [birth year], MRN [id number]  PCP:  Anabel Halon, MD   [facility] Providers Cardiologist:  Dina Rich, MD Electrophysiologist:  Hillis Range, MD (Inactive)     Referring MD: Anabel Halon, MD   History of Present Illness:    [name] is a 72 y.o. female with a medical history significant for CAD, sleep apnea, left bundle branch block, congestive heart failure with reduced EF, 25 to 30% which has improved to 40 to 45%, she has an Abbott CRT-D.     She has an Abbott CRT defibrillator placed by Dr. Johney Frame in 2020 for CHF and left bundle branch block. Barostim implant November 2022.     she has no device related complaints -- no new tenderness, drainage, redness.   EKGs/Labs/Other Studies Reviewed Today:    Echocardiogram:  TTE 02/01/2022 EF 40-45%   Monitors:   Stress testing:   Advanced imaging:   Cardiac catherization   EKG:         Physical Exam:    VS:  BP 120/74   Pulse 96   Ht 5\' 5"  (1.651 m)   Wt 202 lb 3.2 oz (91.7 kg)   SpO2 97%   BMI 33.65 kg/m     Wt Readings from Last 3 Encounters:  03/29/23 202 lb 3.2 oz (91.7 kg)  01/31/23 205 lb (93 kg)  11/30/22 206 lb (93.4 kg)     GEN: Well nourished, well developed in no acute distress CARDIAC: RRR, no murmurs, rubs, gallops The device site is normal -- no tenderness, edema, drainage, redness, threatened erosion. RESPIRATORY:  Normal work of breathing MUSCULOSKELETAL: trace edema    ASSESSMENT & PLAN:    Abbott BiV ICD Normal function Device was interrogated today, I reviewed the interrogation in detail. She is not device dependent today  CHF reduced EF Continue losartan 25, spironolactone 25, carvedilol 12.5 Intolerant of Entresto Barostim in place Receiving 99% BiV pacing  Coronary disease Continue aspirin 81, Lipitor 80  History of left bundle branch block Well-managed with  CRT     Signed, Maurice Small, MD  03/29/2023 12:52 PM     [facility]

## 2023-03-30 ENCOUNTER — Other Ambulatory Visit: Payer: Self-pay | Admitting: Internal Medicine

## 2023-03-30 DIAGNOSIS — E1140 Type 2 diabetes mellitus with diabetic neuropathy, unspecified: Secondary | ICD-10-CM

## 2023-04-01 NOTE — Progress Notes (Signed)
EPIC Encounter for ICM Monitoring  Patient Name: [name] is a 72 y.o. female Date: 04/01/2023 Primary Care Physican: Anabel Halon, MD Primary Cardiologist: Branch Electrophysiologist: Mealor Bi-V Pacing:  99%        11/12/2022 Weight: 206 lbs 01/18/2023 Weight: 206 lbs 02/24/2023 Weight: 206 lbs                                                        Spoke with patient and heart failure questions reviewed.  Transmission results reviewed.  Pt asymptomatic for fluid accumulation.  Reports feeling well at this time and voices no complaints.     Corvue Thoracic impedance suggesting normal fluid levels.     Prescribed:  Furosemide 20 mg take 1 tablet by mouth every other day.   Spironolactone 25 mg take 1 tablet daily.   Labs: 10/13/2022 Creatinine 0.94, BUN 19, Potassium 4.1, Sodium 133, GFR >60 09/27/2022 Creatinine 0.96, BUN 24, Potassium 5.0, Sodium 137, GFR 62 A complete set of results can be found in Results Review.   Recommendations:  No changes and encouraged to call if experiencing any fluid symptoms.      Follow-up plan: ICM clinic phone appointment on 05/02/2023.    91 day device clinic remote transmission 05/09/2023.        EP/Cardiology Office Visits:   06/09/2023 with Dr Wyline Mood.  10/28/2023 with Dr Nelly Laurence.   Copy of ICM check sent to Dr. Nelly Laurence.   3 month ICM trend: 03/28/2023.    12-14 Month ICM trend:     Karie Soda, RN 04/01/2023 3:32 PM

## 2023-05-09 ENCOUNTER — Ambulatory Visit: Payer: Medicare HMO

## 2023-05-09 NOTE — Progress Notes (Signed)
No ICM remote transmission received for 05/02/2023 and next ICM transmission scheduled for 05/24/2023.

## 2023-05-10 ENCOUNTER — Ambulatory Visit: Payer: Medicare HMO | Attending: Cardiovascular Disease

## 2023-05-10 ENCOUNTER — Ambulatory Visit: Payer: Medicare HMO | Admitting: Internal Medicine

## 2023-05-10 DIAGNOSIS — Z95810 Presence of automatic (implantable) cardiac defibrillator: Secondary | ICD-10-CM

## 2023-05-10 DIAGNOSIS — I5022 Chronic systolic (congestive) heart failure: Secondary | ICD-10-CM

## 2023-05-11 ENCOUNTER — Encounter: Payer: Self-pay | Admitting: Internal Medicine

## 2023-05-12 NOTE — Progress Notes (Signed)
EPIC Encounter for ICM Monitoring  Patient Name: [name] is a 72 y.o. female Date: 05/12/2023 Primary Care Physican: Anabel Halon, MD Primary Cardiologist: Branch Electrophysiologist: Mealor Bi-V Pacing:  99%        11/12/2022 Weight: 206 lbs 01/18/2023 Weight: 206 lbs 02/24/2023 Weight: 206 lbs                                                        Spoke with patient and heart failure questions reviewed.  Transmission results reviewed.  Pt asymptomatic for fluid accumulation.  She did have fluid symptoms of lack of appetite and fatigue during decreased impedance and encouraged to call if she develops those symptoms again.     Corvue Thoracic impedance suggesting normal fluid levels.  8/7-8/12 and 8/14-8/17   Prescribed:  Furosemide 20 mg take 1 tablet by mouth every other day.   Spironolactone 25 mg take 1 tablet daily.   Labs: 10/13/2022 Creatinine 0.94, BUN 19, Potassium 4.1, Sodium 133, GFR >60 09/27/2022 Creatinine 0.96, BUN 24, Potassium 5.0, Sodium 137, GFR 62 A complete set of results can be found in Results Review.   Recommendations:  No changes and encouraged to call if experiencing any fluid symptoms.      Follow-up plan: ICM clinic phone appointment on 06/13/2023.    91 day device clinic remote transmission 08/08/2023.        EP/Cardiology Office Visits:   06/09/2023 with Dr Wyline Mood.  11/04/2023 with Dr Nelly Laurence.   Copy of ICM check sent to Dr. Nelly Laurence.   3 month ICM trend: 05/09/2023.    12-14 Month ICM trend:     Karie Soda, RN 05/12/2023 1:34 PM

## 2023-05-19 ENCOUNTER — Other Ambulatory Visit: Payer: Self-pay | Admitting: Cardiology

## 2023-06-09 ENCOUNTER — Ambulatory Visit: Payer: Medicare HMO | Attending: Cardiology | Admitting: Cardiology

## 2023-06-09 ENCOUNTER — Encounter: Payer: Self-pay | Admitting: Cardiology

## 2023-06-09 VITALS — BP 110/68 | HR 95 | Ht 65.0 in | Wt 190.6 lb

## 2023-06-09 DIAGNOSIS — I2510 Atherosclerotic heart disease of native coronary artery without angina pectoris: Secondary | ICD-10-CM

## 2023-06-09 DIAGNOSIS — E782 Mixed hyperlipidemia: Secondary | ICD-10-CM

## 2023-06-09 DIAGNOSIS — I5022 Chronic systolic (congestive) heart failure: Secondary | ICD-10-CM | POA: Diagnosis not present

## 2023-06-09 NOTE — Progress Notes (Signed)
Clinical Summary [name] is a 72 y.o.female seen today for follow up of the following meidcal problems.    1. Chronic combined systolic and diastolic heart failure - echo 09/270 LVEF 25-30%, restrictive diastolic dysfunction. New diagnosis at that time. Repeat echo 06/2014 LVEF 40-45% - cath 03/2014 showed RCA 99% with left to right collaterals, other arteries patent. Overall LV systolic dysfunction out of proportion to CAD.  Medically managed.  - 07/2015 echo LVEF 40%, grade I diastolic dysfunction   - Entresto causes dizziness and nausea, stopped taking. Back on losartan   -  due to low bp's we lowered losartan to 25mg  daily. Lowered lasix to 20mg  daily, may take 40mg  as needed.    01/2018 echo LVEF 35%, grade I diastoilc dysfunction  - 11/2018 echo LVEF 30-35% - 05/03/19 had BIV AICD placed by Dr Nelly Laurence   11/2019 LVEF 30-35% 07/2021 barostim implant  01/2022 echo: LVEF 40-45%, grade I dd      - no SOB/DOE, no recent edema - compliant with meds - normal device check 03/2023   2. CAD - cath 03/2014  with 99% chronic RCA disease, otherwise patent vessels. Managed medically -no chest pains   3. OSA  - on bipap at night. Followed by Dr Vassie Loll, overdue for f/u   4. Hyperlipidemia  02/2020 TC 76 HDL 33 TG 75 LDL 27 - upcoming labs on Monday   5. Chronic LBBB     6. Leg pains - back of thighs and calves with walking, resolves with rest - ABIs were normal but monophasic waveforms suggesting disease. Followed by vascular - 11/2021 CTA: 1. Atherosclerotic calcified and noncalcified plaque seen throughout the superficial femoral and popliteal arteries without high-grade stenoses. 2. Left posterior tibial artery is occluded at the mid lower leg. Two vessel runoff to the left ankle. Three-vessel runoff to the right ankle.  - seen by vacular Dr Arbie Cookey, f/u just as needed.        Past Medical History:  Diagnosis Date   Anemia    Arthritis    CAD (coronary artery disease)     a. LHC (03/2014) Lmain: nl, LAD: diff dz proximal 20%, 30-40% dz mid vessel prior 2nd diagonal, LCx: 40% in OM1, 20-30% in OM2, RCA: 99% subtotal occlusion @ crux, TIMI 1 flow, L-R collaterals to distal vessel   Cardiomyopathy (HCC) 03/28/2014   LV dysfunction out of proportion to CAD 03/28/14   CHF (congestive heart failure) (HCC)    Phreesia 10/03/2020   Chronic systolic heart failure (HCC)    a. ECHO (03/2014): EF 25-30%, akinesis enteroanteroseptal myocardium, grade III DD b. RHC (03/2014) RA 4, RV 42/4, PA 44/14 (26), PCWP 21, PA 62% Fick CO/CI 6.9 / 3.4   Diabetes mellitus    Diabetes mellitus without complication (HCC)    Phreesia 10/03/2020   Duodenal ulcer    Age 16   Dyspnea    Erosive esophagitis    Gastritis    Hypertension    Hypothyroidism    Obese    Short-term memory loss    Thyroid disease    TTP (thrombotic thrombocytopenic purpura) (HCC) 06/2005     Allergies  Allergen Reactions   Other Other (See Comments)    FLAGYL, CIPRO, PROTONIX taken at the same time caused patient to lose her breath and have trouble breathing, requiring hospital stay at [facility], 03/23/14-per patient.    Lisinopril Cough     Current Outpatient Medications  Medication Sig Dispense Refill  acetaminophen (TYLENOL) 500 MG tablet Take 1,000 mg by mouth every 6 (six) hours as needed for moderate pain.     aspirin 81 MG chewable tablet Chew 1 tablet (81 mg total) by mouth daily.     atorvastatin (LIPITOR) 80 MG tablet TAKE 1 TABLET EVERY DAY 90 tablet 3   blood glucose meter kit and supplies Dispense based on patient and insurance preference. Use up to four times daily as directed. (FOR ICD-10 E10.9, E11.9). 1 each 0   carvedilol (COREG) 12.5 MG tablet TAKE 1 TABLET TWICE DAILY (DOSE DECREASE) 180 tablet 3   Ciclopirox 0.77 % gel APPLY ONE TIME DAILY ON THE AFFECTED TOE NAIL (Patient taking differently: Apply 1 application  topically 2 (two) times daily as needed (affected toe nail). APPLY ONE TIME  DAILY ON THE AFFECTED TOE NAIL.) 30 g 0   empagliflozin (JARDIANCE) 10 MG TABS tablet Take 1 tablet (10 mg total) by mouth daily before breakfast. 14 tablet 0   furosemide (LASIX) 20 MG tablet Take 20 mg by mouth every other day.     glipiZIDE (GLUCOTROL) 5 MG tablet Take 1 tablet (5 mg total) by mouth 2 (two) times daily before a meal. 60 tablet 3   Insulin Pen Needle 31G X 5 MM MISC Use as directed for Lantus and Novolog. 100 each 5   LANTUS SOLOSTAR 100 UNIT/ML Solostar Pen INJECT 24 UNITS INTO THE SKIN DAILY. 30 mL 3   levothyroxine (SYNTHROID) 50 MCG tablet TAKE 1 TABLET EVERY DAY BEFORE BREAKFAST 90 tablet 3   losartan (COZAAR) 25 MG tablet TAKE 1 TABLET EVERY DAY 90 tablet 3   Multiple Vitamin (MULTIVITAMIN) tablet Take 1 tablet by mouth daily.     ondansetron (ZOFRAN) 4 MG tablet Take 1 tablet (4 mg total) by mouth every 6 (six) hours. 10 tablet 0   polyethylene glycol (MIRALAX / GLYCOLAX) packet Take 17 g by mouth daily as needed for mild constipation.      spironolactone (ALDACTONE) 25 MG tablet TAKE 1 TABLET EVERY DAY 90 tablet 3   TRUE METRIX BLOOD GLUCOSE TEST test strip USE AS DIRECTED TO TEST BLOOD SUGAR UP TO FOUR TIMES DAILY 400 strip 3   TRUEplus Lancets 33G MISC USE AS DIRECTED TO TEST BLOOD SUGAR UP TO FOUR TIMES DAILY 400 each 3   No current facility-administered medications for this visit.     Past Surgical History:  Procedure Laterality Date   BIV ICD INSERTION CRT-D N/A 05/03/2019   Procedure: BIV ICD INSERTION CRT-D;  Surgeon: Hillis Range, MD;  Location: [facility] INVASIVE CV LAB;  Service: Cardiovascular;  Laterality: N/A;   CARDIAC CATHETERIZATION     COLONOSCOPY  08/29/2012   GNF:AOZHYQM diverticulosis   COLONOSCOPY N/A 05/28/2015   Procedure: COLONOSCOPY;  Surgeon: Corbin Ade, MD;  Location: [facility] ENDO SUITE;  Service: Endoscopy;  Laterality: N/A;  1015   ECTOPIC PREGNANCY SURGERY     ESOPHAGOGASTRODUODENOSCOPY  04/20/2012   VHQ:IONGEXB antral gastritis/Erosive  reflux esophagitis   LEFT AND RIGHT HEART CATHETERIZATION WITH CORONARY ANGIOGRAM N/A 03/28/2014   Procedure: LEFT AND RIGHT HEART CATHETERIZATION WITH CORONARY ANGIOGRAM;  Surgeon: Peter M Swaziland, MD;  Location: [facility] CATH LAB;  Service: Cardiovascular;  Laterality: N/A;   SPLENECTOMY  07/20/2005     Allergies  Allergen Reactions   Other Other (See Comments)    FLAGYL, CIPRO, PROTONIX taken at the same time caused patient to lose her breath and have trouble breathing, requiring hospital stay at [facility], 03/23/14-per patient.  Lisinopril Cough      Family History  Problem Relation Age of Onset   Heart failure Mother    Cirrhosis Father        ETOH   Heart disease Brother    Diabetes Daughter    Breast cancer Maternal Aunt    Colon cancer Neg Hx      Social History Ms. Mcneer reports that she quit smoking about 44 years ago. Her smoking use included cigarettes. She started smoking about 56 years ago. She has a 3 pack-year smoking history. She has never used smokeless tobacco. Ms. Simonton reports no history of alcohol use.   Review of Systems CONSTITUTIONAL: No weight loss, fever, chills, weakness or fatigue.  HEENT: Eyes: No visual loss, blurred vision, double vision or yellow sclerae.No hearing loss, sneezing, congestion, runny nose or sore throat.  SKIN: No rash or itching.  CARDIOVASCULAR: per hpi RESPIRATORY: No shortness of breath, cough or sputum.  GASTROINTESTINAL: No anorexia, nausea, vomiting or diarrhea. No abdominal pain or blood.  GENITOURINARY: No burning on urination, no polyuria NEUROLOGICAL: No headache, dizziness, syncope, paralysis, ataxia, numbness or tingling in the extremities. No change in bowel or bladder control.  MUSCULOSKELETAL: No muscle, back pain, joint pain or stiffness.  LYMPHATICS: No enlarged nodes. No history of splenectomy.  PSYCHIATRIC: No history of depression or anxiety.  ENDOCRINOLOGIC: No reports of sweating, cold or heat intolerance. No  polyuria or polydipsia.  Marland Kitchen   Physical Examination Today's Vitals   06/09/23 1059  BP: 110/68  Pulse: 95  SpO2: 98%  Weight: 190 lb 9.6 oz (86.5 kg)  Height: 5\' 5"  (1.651 m)   Body mass index is 31.72 kg/m.  Gen: resting comfortably, no acute distress HEENT: no scleral icterus, pupils equal round and reactive, no palptable cervical adenopathy,  CV: RRR, no m/rg, no jvd Resp: Clear to auscultation bilaterally GI: abdomen is soft, non-tender, non-distended, normal bowel sounds, no hepatosplenomegaly MSK: extremities are warm, no edema.  Skin: warm, no rash Neuro:  no focal deficits Psych: appropriate affect   Diagnostic Studies  03/2014 echo Study Conclusions  - Left ventricle: The cavity size was normal. There was mild focal basal hypertrophy of the septum. Systolic function was severely reduced. The estimated ejection fraction was in the range of 25% to 30%. There is akinesis of the entireanteroseptal myocardium. Doppler parameters are consistent with a reversible restrictive pattern, indicative of decreased left ventricular diastolic compliance and/or increased left atrial pressure (grade 3 diastolic dysfunction). - Mitral valve: There was mild regurgitation. - Left atrium: The atrium was moderately dilated.  03/2014 Cath Procedural Findings:   Hemodynamics   RA 8/0 mean 4 mm Hg   RV 42/4 mm Hg   PA 44/14 mean 26 mm Hg   PCWP 20/28 mean 21 mm Hg   LV 98/18 mm Hg   AO 95/54 mean 72 mm Hg   Oxygen saturations:   PA 62%   AO 90%   Cardiac Output (Fick) 6.9 L/min   Cardiac Index (Fick) 3.4 L/min/meter squared   Coronary angiography:   Coronary dominance: right   Left mainstem: Normal   Left anterior descending (LAD): Diffuse disease in the proximal vessel up to 20%. 30-40% disease in the mid vessel prior to the second diagonal.   Left circumflex (LCx): 40% in OM1. 20-30% in OM2. Otherwise no significant disease.   Right coronary artery (RCA): 99% subtotal  occlusion at the crux. TIMI 1 flow. Left to right collaterals to the distal vessel.  Left ventriculography: Left ventricular systolic function is abnormal, LVEF is estimated at 25%. There is inferior akinesis and severe global hypokinesis. There is mild mitral regurgitation   Final Conclusions:   1. Single vessel obstructive CAD with subtotal occlusion of the RCA at the crux.   2. Severe LV dysfunction.   3. Normal Right heart pressures. Elevated PCWP.   Recommendations: Medical management. Her degree of LV dysfunction is out of proportion to her CAD. She has no anginal symptoms so PCI of RCA not indicated. The inferior wall appears scarred.   07/2015 echo Study Conclusions   - Left ventricle: The cavity size was at the upper limits of   normal. Wall thickness was increased in a pattern of moderate   LVH. The estimated ejection fraction was 40% (similar calculation   per biplane speckle tracking, reduced global longitudinal strain   of -14.3%). There is akinesis of the basalinferolateral and   inferior myocardium. Doppler parameters are consistent with   abnormal left ventricular relaxation (grade 1 diastolic   dysfunction). - Aortic valve: Mildly calcified annulus. Trileaflet. - Mitral valve: Calcified annulus. There was trivial regurgitation. - Left atrium: The atrium was at the upper limits of normal in   size. - Right atrium: Central venous pressure (est): 3 mm Hg. - Atrial septum: A patent foramen ovale cannot be excluded. - Tricuspid valve: There was trivial regurgitation. - Pulmonary arteries: Systolic pressure could not be accurately   estimated. - Pericardium, extracardiac: There was no pericardial effusion.   Impressions:   - Upper normal LV chamber size with moderate LVH and LVEF   approximately 40% as discussed above. There is akinesis of the   basal inferoseptal and inferior myocardium. Grade 1 diastolic   dysfunction. Compared to the previous study from October  2015,   LVEF is in similar range, perhaps slightly reduced. Upper normal   left atrial chamber size. Trivial tricuspid regurgitation. Cannot   exclude PFO based on limited images.     01/2018 echo Study Conclusions   - Left ventricle: The cavity size was normal. Wall thickness was   increased in a pattern of mild LVH. The estimated ejection   fraction was 35%. There is akinesis of the basal-midinferior   myocardium. There is akinesis of the mid-apicalanteroseptal and   apical myocardium. Doppler parameters are consistent with   abnormal left ventricular relaxation (grade 1 diastolic   dysfunction). - Ventricular septum: Septal motion showed abnormal function and   dyssynergy suggestive of left bundle [name] block. - Aortic valve: Moderately calcified annulus. Trileaflet. - Mitral valve: There was mild regurgitation. - Right atrium: Central venous pressure (est): 3 mm Hg. - Atrial septum: No defect or patent foramen ovale was identified. - Tricuspid valve: There was trivial regurgitation. - Pulmonary arteries: Systolic pressure could not be accurately   estimated. - Pericardium, extracardiac: There was no pericardial effusion     11/2018 echo 1. The left ventricle has moderate-severely reduced systolic function, with an ejection fraction of 30-35%. The cavity size was normal. There is mildly increased left ventricular wall thickness. Left ventricular diastolic Doppler parameters are  consistent with impaired relaxation. Elevated mean left atrial pressure.  2. The anteroseptal, anterolateral, apical walls are hypokinetic.  3. The right ventricle has normal systolic function. The cavity was normal. There is no increase in right ventricular wall thickness.  4. Left atrial size was moderately dilated.  5. No evidence of mitral valve stenosis.  6. The aortic valve is tricuspid no stenosis of  the aortic valve.  7. Pulmonary hypertension is indeterminant, inadequate TR jet.  8. The  inferior vena cava was dilated in size with <50% respiratory variability.  9. The interatrial septum was not well visualized.   05/2021 ABI FINDINGS: Right ABI:  1.22   Left ABI:  1.14   Right Lower Extremity:  Monophasic waveforms are seen.   Left Lower Extremity:  Monophasic waveforms are seen.   IMPRESSION: Although ABI values are within normal limits, bilateral monophasic waveforms are still suspicious for significant underlying arterial occlusive disease. Further evaluation with CT angiography of the abdominal aorta and bilateral lower extremity should be considered.   Assessment and Plan  1. Chronic combined systolic/diastolic HF - medical therapy limited by soft bp's. Did not tolerate entresto. She is on her maximally tolerated regimen. More recent issues with low bp's, coreg and lasix were lowered, bps have improved - s/p BIV AICD, barostim - no significant symptoms, continue current meds   2. CAD - medically managed single vessel RCA disease - no recent symptoms, continue current meds   3. Hyperlipidemia -upcoming labs with pcp - continue current meds   F/u 6 months     [name], M.D.

## 2023-06-09 NOTE — Patient Instructions (Signed)
Medication Instructions:  Continue all current medications.  Labwork: none  Testing/Procedures: none  Follow-Up: 6 months   Any Other Special Instructions Will Be Listed Below (If Applicable).  If you need a refill on your cardiac medications before your next appointment, please call your pharmacy.  

## 2023-06-13 ENCOUNTER — Encounter: Payer: Self-pay | Admitting: Internal Medicine

## 2023-06-13 ENCOUNTER — Ambulatory Visit (INDEPENDENT_AMBULATORY_CARE_PROVIDER_SITE_OTHER): Payer: Medicare HMO | Admitting: Internal Medicine

## 2023-06-13 ENCOUNTER — Ambulatory Visit: Payer: Medicare HMO | Attending: Cardiovascular Disease

## 2023-06-13 VITALS — BP 125/79 | HR 105 | Ht 65.0 in | Wt 190.4 lb

## 2023-06-13 DIAGNOSIS — E1140 Type 2 diabetes mellitus with diabetic neuropathy, unspecified: Secondary | ICD-10-CM

## 2023-06-13 DIAGNOSIS — G4733 Obstructive sleep apnea (adult) (pediatric): Secondary | ICD-10-CM

## 2023-06-13 DIAGNOSIS — I5022 Chronic systolic (congestive) heart failure: Secondary | ICD-10-CM | POA: Diagnosis not present

## 2023-06-13 DIAGNOSIS — I10 Essential (primary) hypertension: Secondary | ICD-10-CM

## 2023-06-13 DIAGNOSIS — E66811 Obesity, class 1: Secondary | ICD-10-CM

## 2023-06-13 DIAGNOSIS — E559 Vitamin D deficiency, unspecified: Secondary | ICD-10-CM | POA: Diagnosis not present

## 2023-06-13 DIAGNOSIS — E782 Mixed hyperlipidemia: Secondary | ICD-10-CM

## 2023-06-13 DIAGNOSIS — Z23 Encounter for immunization: Secondary | ICD-10-CM

## 2023-06-13 DIAGNOSIS — I5042 Chronic combined systolic (congestive) and diastolic (congestive) heart failure: Secondary | ICD-10-CM | POA: Diagnosis not present

## 2023-06-13 DIAGNOSIS — E039 Hypothyroidism, unspecified: Secondary | ICD-10-CM

## 2023-06-13 DIAGNOSIS — Z794 Long term (current) use of insulin: Secondary | ICD-10-CM | POA: Diagnosis not present

## 2023-06-13 DIAGNOSIS — E669 Obesity, unspecified: Secondary | ICD-10-CM | POA: Diagnosis not present

## 2023-06-13 DIAGNOSIS — Z95810 Presence of automatic (implantable) cardiac defibrillator: Secondary | ICD-10-CM

## 2023-06-13 NOTE — Patient Instructions (Addendum)
Please continue to take medications as prescribed.  Please continue to follow low carb diet and ambulate as tolerated.  Please consider getting Shingrix and Tdap vaccine at local pharmacy.

## 2023-06-13 NOTE — Assessment & Plan Note (Addendum)
Lab Results  Component Value Date   HGBA1C 7.1 (A) 01/31/2023   Well-controlled On Lantus 24 U QD and Glipizide 5 mg BID  She used to take Lantus BID, but has had ONCE DAILY dosing recently - has not brought glucometer or log today, she would need to be on BID dosing  Discontinued NovoLog in the last visit as she had not required it a lot of times, switched to mealtime glipizide and strictly advised to take it just before meals Had referred to endocrinology Advised to follow diabetic diet On statin F/u CMP and lipid panel Diabetic eye exam: Advised to follow up with Ophthalmology for diabetic eye exam  Can consider Cymbalta for diabetic neuropathy, would also help with chronic fatigue - she prefers to discuss it later Avoid skipping any meal. Advised to keep snacks or orange juice accessible most of the time.

## 2023-06-14 NOTE — Progress Notes (Signed)
EPIC Encounter for ICM Monitoring  Patient Name: [name] is a 72 y.o. female Date: 06/14/2023 Primary Care Physican: Anabel Halon, MD Primary Cardiologist: Branch Electrophysiologist: Mealor Bi-V Pacing:  >99%        06/14/2023 Weight: 190 lbs                                                        Spoke with patient and heart failure questions reviewed.  Transmission results reviewed.  Pt reports body feels a little tight in her body and extreme tiredness correlating with the days of fluid accumulation.     Corvue Thoracic impedance suggesting possible fluid accumulation starting 9/22.  Also suggesting possible fluid accumulation from 9/14-9/20.   Prescribed:  Furosemide 20 mg take 1 tablet by mouth every other day.   Spironolactone 25 mg take 1 tablet daily.   Labs: 06/13/2023 Creatinine 0.86,BUN 15, Potassium 4.7, Sodium 133, GFR 71, Glucose 394 10/13/2022 Creatinine 0.94, BUN 19, Potassium 4.1, Sodium 133, GFR >60 09/27/2022 Creatinine 0.96, BUN 24, Potassium 5.0, Sodium 137, GFR 62 A complete set of results can be found in Results Review.   Recommendations:  Advised to take Furosemide 1 tablet 20 mg x 3 consecutive days and then return to every other day.    Follow-up plan: ICM clinic phone appointment on 06/27/2023 to recheck fluid levels.    91 day device clinic remote transmission 08/08/2023.        EP/Cardiology Office Visits:   11/23/2023 with Dr Wyline Mood.  11/04/2023 with Dr Nelly Laurence.   Copy of ICM check sent to Dr. Nelly Laurence and Dr Wyline Mood as Lorain Childes and review.   3 month ICM trend: 06/13/2023.    12-14 Month ICM trend:     Karie Soda, RN 06/14/2023 7:50 AM

## 2023-06-15 LAB — CMP14+EGFR
ALT: 19 IU/L (ref 0–32)
AST: 17 IU/L (ref 0–40)
Albumin: 3.7 g/dL — ABNORMAL LOW (ref 3.8–4.8)
Alkaline Phosphatase: 82 IU/L (ref 44–121)
BUN/Creatinine Ratio: 17 (ref 12–28)
BUN: 15 mg/dL (ref 8–27)
Bilirubin Total: 0.5 mg/dL (ref 0.0–1.2)
CO2: 23 mmol/L (ref 20–29)
Calcium: 9.7 mg/dL (ref 8.7–10.3)
Chloride: 98 mmol/L (ref 96–106)
Creatinine, Ser: 0.86 mg/dL (ref 0.57–1.00)
Globulin, Total: 3.9 g/dL (ref 1.5–4.5)
Glucose: 394 mg/dL — ABNORMAL HIGH (ref 70–99)
Potassium: 4.7 mmol/L (ref 3.5–5.2)
Sodium: 133 mmol/L — ABNORMAL LOW (ref 134–144)
Total Protein: 7.6 g/dL (ref 6.0–8.5)
eGFR: 71 mL/min/1.73

## 2023-06-15 LAB — HEMOGLOBIN A1C
Est. average glucose Bld gHb Est-mCnc: >398 mg/dL
Hgb A1c MFr Bld: >15.5 % — ABNORMAL HIGH (ref 4.8–5.6)

## 2023-06-15 LAB — LIPID PANEL
Chol/HDL Ratio: 3.2 ratio (ref 0.0–4.4)
Cholesterol, Total: 120 mg/dL (ref 100–199)
HDL: 37 mg/dL — ABNORMAL LOW (ref >39)
LDL Chol Calc (NIH): 58 mg/dL (ref 0–99)
Triglycerides: 145 mg/dL (ref 0–149)
VLDL Cholesterol Cal: 25 mg/dL (ref 5–40)

## 2023-06-15 LAB — TSH+FREE T4
Free T4: 1.24 ng/dL (ref 0.82–1.77)
TSH: 1.74 u[IU]/mL (ref 0.450–4.500)

## 2023-06-15 LAB — CBC WITH DIFFERENTIAL/PLATELET
Basophils Absolute: 0 x10E3/uL (ref 0.0–0.2)
Basos: 1 %
EOS (ABSOLUTE): 0.3 x10E3/uL (ref 0.0–0.4)
Eos: 5 %
Hematocrit: 36.2 % (ref 34.0–46.6)
Hemoglobin: 11.3 g/dL (ref 11.1–15.9)
Immature Grans (Abs): 0 x10E3/uL (ref 0.0–0.1)
Immature Granulocytes: 0 %
Lymphocytes Absolute: 1 x10E3/uL (ref 0.7–3.1)
Lymphs: 17 %
MCH: 28 pg (ref 26.6–33.0)
MCHC: 31.2 g/dL — ABNORMAL LOW (ref 31.5–35.7)
MCV: 90 fL (ref 79–97)
Monocytes Absolute: 0.4 x10E3/uL (ref 0.1–0.9)
Monocytes: 7 %
Neutrophils Absolute: 4 x10E3/uL (ref 1.4–7.0)
Neutrophils: 70 %
Platelets: 150 x10E3/uL (ref 150–450)
RBC: 4.04 x10E6/uL (ref 3.77–5.28)
RDW: 15 % (ref 11.7–15.4)
WBC: 5.7 x10E3/uL (ref 3.4–10.8)

## 2023-06-15 LAB — VITAMIN D 25 HYDROXY (VIT D DEFICIENCY, FRACTURES): Vit D, 25-Hydroxy: 15.6 ng/mL — ABNORMAL LOW (ref 30.0–100.0)

## 2023-06-17 DIAGNOSIS — E1169 Type 2 diabetes mellitus with other specified complication: Secondary | ICD-10-CM | POA: Insufficient documentation

## 2023-06-17 DIAGNOSIS — E782 Mixed hyperlipidemia: Secondary | ICD-10-CM | POA: Insufficient documentation

## 2023-06-17 NOTE — Assessment & Plan Note (Signed)
Well-controlled with Losartan, Coreg, Spironolactone and Lasix

## 2023-06-17 NOTE — Assessment & Plan Note (Signed)
On ARB, Spironolactone and Lasix Appears euvolemic currently 

## 2023-06-17 NOTE — Assessment & Plan Note (Signed)
Lab Results  Component Value Date   TSH 1.740 06/13/2023    On Levothyroxine 50 mcg QD Advised for compliance Follow up TSH and free T4

## 2023-06-17 NOTE — Assessment & Plan Note (Signed)
On Lipitor 

## 2023-06-17 NOTE — Assessment & Plan Note (Signed)
Diet modification and moderate exercise/walking advised 

## 2023-06-17 NOTE — Assessment & Plan Note (Signed)
Needs new BiPAP as old device was recalled Has seen Pulmonology

## 2023-06-22 ENCOUNTER — Ambulatory Visit (INDEPENDENT_AMBULATORY_CARE_PROVIDER_SITE_OTHER): Payer: Medicare HMO | Admitting: Internal Medicine

## 2023-06-22 ENCOUNTER — Encounter: Payer: Self-pay | Admitting: Internal Medicine

## 2023-06-22 VITALS — BP 131/83 | HR 86 | Ht 65.0 in | Wt 190.0 lb

## 2023-06-22 DIAGNOSIS — I5042 Chronic combined systolic (congestive) and diastolic (congestive) heart failure: Secondary | ICD-10-CM | POA: Diagnosis not present

## 2023-06-22 DIAGNOSIS — I10 Essential (primary) hypertension: Secondary | ICD-10-CM | POA: Diagnosis not present

## 2023-06-22 DIAGNOSIS — E1140 Type 2 diabetes mellitus with diabetic neuropathy, unspecified: Secondary | ICD-10-CM

## 2023-06-22 DIAGNOSIS — Z794 Long term (current) use of insulin: Secondary | ICD-10-CM | POA: Diagnosis not present

## 2023-06-22 MED ORDER — OZEMPIC (0.25 OR 0.5 MG/DOSE) 2 MG/3ML ~~LOC~~ SOPN: PEN_INJECTOR | SUBCUTANEOUS | 1 refills | Status: DC

## 2023-06-22 MED ORDER — BLOOD GLUCOSE METER KIT: PACK | 0 refills | Status: DC

## 2023-06-22 MED ORDER — GVOKE HYPOPEN 2-PACK 1 MG/0.2ML ~~LOC~~ SOAJ: 1.0000 mg | SUBCUTANEOUS | 5 refills | Status: DC | PRN

## 2023-06-22 MED ORDER — LANTUS SOLOSTAR 100 UNIT/ML ~~LOC~~ SOPN: 24.0000 [IU] | PEN_INJECTOR | Freq: Two times a day (BID) | SUBCUTANEOUS | 3 refills | Status: DC

## 2023-06-22 NOTE — Patient Instructions (Signed)
Please start taking Ozempic 0.25 mg once weekly for 4 weeks and then 0.50 mg once weekly.  Please start taking Lantus 24 U twice daily instead of once daily.  Please follow low carb diet and ambulate as tolerated.

## 2023-06-22 NOTE — Progress Notes (Unsigned)
Established Patient Office Visit  Subjective:  Patient ID: [name], female    DOB: 30-Nov-1950  Age: 72 y.o. MRN: [id number]  CC:  Chief Complaint  Patient presents with   Diabetes    One week follow up    HPI [name] is a 72 y.o. female with past medical history of chronic combined systolic and diastolic CHF, ischemic cardiomyopathy status post ICD placement, hypertension, hyperlipidemia, type 2 diabetes mellitus, obstructive sleep apnea on BiPAP at night, obesity, coronary artery disease, GERD and hypothyroidism who presents for f/u of her chronic medical conditions.  Type II DM: She checks her blood glucose regularly, but found out that her glucometer had been malfunctioning-and states that it had been showing between 120-160 most of the time.  She tried taking her blood glucose with a different glucometer after last blood tests, and found out that her blood glucose was around 350-400 for the last 2 days. She takes insulin regularly -Lantus 24 units once daily.  Her HbA1c is >15.5 now. Previous HbA1C was 7.1 in 05/24.  She has not noticed any episodes of hypoglycemia since decreasing the dose of Lantus in the last visit. She has chronic fatigue and hypersomnolence, but denies any polyuria or polyphagia.  She admits that she had been eating sweets and needs to cut down.  She reports chronic fatigue and visual disturbance -blurry vision.  Of note, she has a history of type II DM and has not seen Ophthalmologist for it recently.  Denies any redness or discharge from the eyes.  Denies any eye pain currently.  HTN and CHF: Her blood pressure has been stable at home. Takes medications regularly. Patient denies headache, dizziness, chest pain.  She has intermittent leg swelling.  She has AICD in place, for followed by cardiology and EP cardiology.  Hypothyroidism: She has been taking levothyroxine 50 mcg daily.  She has chronic fatigue, but denies any recent change in weight.  Gait  imbalance: She has chronic balance problems.  She uses walker for support.  She also has history of diabetic neuropathy.   Past Medical History:  Diagnosis Date   Anemia    Arthritis    CAD (coronary artery disease)    a. LHC (03/2014) L[place]: nl, LAD: diff dz proximal 20%, 30-40% dz mid vessel prior 2nd diagonal, LCx: 40% in OM1, 20-30% in OM2, RCA: 99% subtotal occlusion @ crux, TIMI 1 flow, L-R collaterals to distal vessel   Cardiomyopathy (HCC) 03/28/2014   LV dysfunction out of proportion to CAD 03/28/14   CHF (congestive heart failure) (HCC)    Phreesia 10/03/2020   Chronic systolic heart failure (HCC)    a. ECHO (03/2014): EF 25-30%, akinesis enteroanteroseptal myocardium, grade III DD b. RHC (03/2014) RA 4, RV 42/4, PA 44/14 (26), PCWP 21, PA 62% Fick CO/CI 6.9 / 3.4   Diabetes mellitus    Diabetes mellitus without complication (HCC)    Phreesia 10/03/2020   Duodenal ulcer    Age 67   Dyspnea    Erosive esophagitis    Gastritis    Hypertension    Hypothyroidism    Obese    Short-term memory loss    Thyroid disease    TTP (thrombotic thrombocytopenic purpura) (HCC) 06/2005    Past Surgical History:  Procedure Laterality Date   BIV ICD INSERTION CRT-D N/A 05/03/2019   Procedure: BIV ICD INSERTION CRT-D;  Surgeon: Hillis Range, MD;  Location: [facility];  Service: Cardiovascular;  Laterality: N/A;  CARDIAC CATHETERIZATION     COLONOSCOPY  08/29/2012   [id number] diverticulosis   COLONOSCOPY N/A 05/28/2015   Procedure: COLONOSCOPY;  Surgeon: Corbin Ade, MD;  Location: [facility];  Service: Endoscopy;  Laterality: N/A;  1015   ECTOPIC PREGNANCY SURGERY     ESOPHAGOGASTRODUODENOSCOPY  04/20/2012   VOZ:[id number] antral gastritis/Erosive reflux esophagitis   LEFT AND RIGHT HEART CATHETERIZATION WITH CORONARY ANGIOGRAM N/A 03/28/2014   Procedure: LEFT AND RIGHT HEART CATHETERIZATION WITH CORONARY ANGIOGRAM;  Surgeon: Peter M Swaziland, MD;  Location: [facility] CATH LAB;   Service: Cardiovascular;  Laterality: N/A;   SPLENECTOMY  07/20/2005    Family History  Problem Relation Age of Onset   Heart failure Mother    Cirrhosis Father        ETOH   Heart disease Brother    Diabetes Daughter    Breast cancer Maternal Aunt    Colon cancer Neg Hx     Social History   Socioeconomic History   Marital status: Single    Spouse name: Not on file   Number of children: 3   Years of education: 12   Highest education level: Some college, no degree  Occupational History   Occupation: disabled    Associate Professor: NOT EMPLOYED  Tobacco Use   Smoking status: Former    Current packs/day: 0.00    Average packs/day: 0.3 packs/day for 12.0 years (3.0 ttl pk-yrs)    Types: Cigarettes    Start date: 09/20/1966    Quit date: 09/20/1978    Years since quitting: 44.7   Smokeless tobacco: Never  Vaping Use   Vaping status: Never Used  Substance and Sexual Activity   Alcohol use: No    Alcohol/week: 0.0 standard drinks of alcohol   Drug use: No   Sexual activity: Not Currently  Other Topics Concern   Not on file  Social History Narrative   Lives Alone       Social Determinants of Health   Financial Resource Strain: Low Risk  (12/06/2022)   Overall Financial Resource Strain (CARDIA)    Difficulty of Paying Living Expenses: Not hard at all  Food Insecurity: No Food Insecurity (12/06/2022)   Hunger Vital Sign    Worried About Running Out of Food in the Last Year: Never true    Ran Out of Food in the Last Year: Never true  Transportation Needs: No Transportation Needs (12/06/2022)   PRAPARE - Administrator, Civil Service (Medical): No    Lack of Transportation (Non-Medical): No  Physical Activity: Sufficiently Active (12/06/2022)   Exercise Vital Sign    Days of Exercise per Week: 5 days    Minutes of Exercise per Session: 30 min  Stress: No Stress Concern Present (12/06/2022)   [employer] of Occupational Health - Occupational Stress Questionnaire     Feeling of Stress : Not at all  Social Connections: Moderately Integrated (12/06/2022)   Social Connection and Isolation Panel [NHANES]    Frequency of Communication with Friends and Family: More than three times a week    Frequency of Social Gatherings with Friends and Family: More than three times a week    Attends Religious Services: More than 4 times per year    Active Member of [employer] or Organizations: Yes    Attends Banker Meetings: More than 4 times per year    Marital Status: Divorced  Intimate Partner Violence: Not At Risk (12/06/2022)   Humiliation, Afraid, Rape, and Kick questionnaire  Fear of Current or Ex-Partner: No    Emotionally Abused: No    Physically Abused: No    Sexually Abused: No    Outpatient Medications Prior to Visit  Medication Sig Dispense Refill   acetaminophen (TYLENOL) 500 MG tablet Take 1,000 mg by mouth every 6 (six) hours as needed for moderate pain.     aspirin 81 MG chewable tablet Chew 1 tablet (81 mg total) by mouth daily.     atorvastatin (LIPITOR) 80 MG tablet TAKE 1 TABLET EVERY DAY 90 tablet 3   carvedilol (COREG) 12.5 MG tablet TAKE 1 TABLET TWICE DAILY (DOSE DECREASE) 180 tablet 3   Ciclopirox 0.77 % gel APPLY ONE TIME DAILY ON THE AFFECTED TOE NAIL (Patient taking differently: Apply 1 application  topically 2 (two) times daily as needed (affected toe nail). APPLY ONE TIME DAILY ON THE AFFECTED TOE NAIL.) 30 g 0   empagliflozin (JARDIANCE) 10 MG TABS tablet Take 1 tablet (10 mg total) by mouth daily before breakfast. 14 tablet 0   furosemide (LASIX) 20 MG tablet Take 20 mg by mouth every other day.     glipiZIDE (GLUCOTROL) 5 MG tablet Take 1 tablet (5 mg total) by mouth 2 (two) times daily before a meal. 60 tablet 3   Insulin Pen Needle 31G X 5 MM MISC Use as directed for Lantus and Novolog. 100 each 5   levothyroxine (SYNTHROID) 50 MCG tablet TAKE 1 TABLET EVERY DAY BEFORE BREAKFAST 90 tablet 3   losartan (COZAAR) 25 MG tablet  TAKE 1 TABLET EVERY DAY 90 tablet 3   Multiple Vitamin (MULTIVITAMIN) tablet Take 1 tablet by mouth daily.     ondansetron (ZOFRAN) 4 MG tablet Take 1 tablet (4 mg total) by mouth every 6 (six) hours. 10 tablet 0   polyethylene glycol (MIRALAX / GLYCOLAX) packet Take 17 g by mouth daily as needed for mild constipation.      spironolactone (ALDACTONE) 25 MG tablet TAKE 1 TABLET EVERY DAY 90 tablet 3   TRUE METRIX BLOOD GLUCOSE TEST test strip USE AS DIRECTED TO TEST BLOOD SUGAR UP TO FOUR TIMES DAILY 400 strip 3   TRUEplus Lancets 33G MISC USE AS DIRECTED TO TEST BLOOD SUGAR UP TO FOUR TIMES DAILY 400 each 3   blood glucose meter kit and supplies Dispense based on patient and insurance preference. Use up to four times daily as directed. (FOR ICD-10 E10.9, E11.9). 1 each 0   LANTUS SOLOSTAR 100 UNIT/ML Solostar Pen INJECT 24 UNITS INTO THE SKIN DAILY. 30 mL 3   No facility-administered medications prior to visit.    Allergies  Allergen Reactions   Other Other (See Comments)    FLAGYL, CIPRO, PROTONIX taken at the same time caused patient to lose her breath and have trouble breathing, requiring hospital stay at [facility], 03/23/14-per patient.    Lisinopril Cough    ROS Review of Systems  Constitutional:  Positive for fatigue. Negative for chills and fever.  HENT:  Negative for congestion and sore throat.   Eyes:  Positive for visual disturbance. Negative for pain and discharge.  Respiratory:  Negative for cough and shortness of breath.   Cardiovascular:  Positive for leg swelling. Negative for chest pain and palpitations.  Gastrointestinal:  Negative for abdominal pain, diarrhea, nausea and vomiting.  Endocrine: Negative for polydipsia and polyuria.  Genitourinary:  Negative for dysuria and hematuria.  Musculoskeletal:  Positive for arthralgias and back pain. Negative for neck pain and neck stiffness.  Skin:  Negative for rash.  Neurological:  Positive for weakness. Negative for speech  difficulty, numbness and headaches.  Psychiatric/Behavioral:  Negative for agitation and behavioral problems.       Objective:    Physical Exam Vitals reviewed.  Constitutional:      General: She is not in acute distress.    Appearance: She is not diaphoretic.  HENT:     Head: Normocephalic and atraumatic.     Nose: Nose normal. No congestion.     Mouth/Throat:     Mouth: Mucous membranes are moist.     Pharynx: No posterior oropharyngeal erythema.  Eyes:     General: No scleral icterus.    Extraocular Movements: Extraocular movements intact.  Cardiovascular:     Rate and Rhythm: Normal rate and regular rhythm.     Pulses: Normal pulses.     Heart sounds: Normal heart sounds. No murmur heard. Pulmonary:     Breath sounds: Normal breath sounds. No wheezing or rales.  Musculoskeletal:     Cervical back: Neck supple. No tenderness.     Right lower leg: No edema.     Left lower leg: No edema.  Skin:    General: Skin is warm.     Findings: No rash.  Neurological:     General: No focal deficit present.     Mental Status: She is alert and oriented to person, place, and time.     Sensory: No sensory deficit.     Motor: Weakness (B/l LE - 3/5) present.  Psychiatric:        Mood and Affect: Mood normal.        Behavior: Behavior normal.     BP 131/83 (BP Location: Left Arm, Patient Position: Sitting, Cuff Size: Normal)   Pulse 86   Ht 5\' 5"  (1.651 m)   Wt 190 lb (86.2 kg)   SpO2 93%   BMI 31.62 kg/m  Wt Readings from Last 3 Encounters:  06/22/23 190 lb (86.2 kg)  06/13/23 190 lb 6.4 oz (86.4 kg)  06/09/23 190 lb 9.6 oz (86.5 kg)    Lab Results  Component Value Date   TSH 1.740 06/13/2023   Lab Results  Component Value Date   WBC 5.7 06/13/2023   HGB 11.3 06/13/2023   HCT 36.2 06/13/2023   MCV 90 06/13/2023   PLT 150 06/13/2023   Lab Results  Component Value Date   NA 133 (L) 06/13/2023   K 4.7 06/13/2023   CO2 23 06/13/2023   GLUCOSE 394 (H) 06/13/2023    BUN 15 06/13/2023   CREATININE 0.86 06/13/2023   BILITOT 0.5 06/13/2023   ALKPHOS 82 06/13/2023   AST 17 06/13/2023   ALT 19 06/13/2023   PROT 7.6 06/13/2023   ALBUMIN 3.7 (L) 06/13/2023   CALCIUM 9.7 06/13/2023   ANIONGAP 7 10/13/2022   EGFR 71 06/13/2023   Lab Results  Component Value Date   CHOL 120 06/13/2023   Lab Results  Component Value Date   HDL 37 (L) 06/13/2023   Lab Results  Component Value Date   LDLCALC 58 06/13/2023   Lab Results  Component Value Date   TRIG 145 06/13/2023   Lab Results  Component Value Date   CHOLHDL 3.2 06/13/2023   Lab Results  Component Value Date   HGBA1C >15.5 (H) 06/13/2023      Assessment & Plan:   Problem List Items Addressed This Visit       Cardiovascular and Mediastinum   HTN (hypertension)  Well-controlled with Losartan, Coreg, Spironolactone and Lasix      Chronic combined systolic and diastolic CHF (congestive heart failure) (HCC)    On ARB, Spironolactone, Jardiance and Lasix Appears euvolemic currently        Endocrine   Type 2 diabetes mellitus with diabetic neuropathy (HCC) - Primary    Lab Results  Component Value Date   HGBA1C >15.5 (H) 06/13/2023   Uncontrolled likely due to noncompliance to diet and insulin regimen On Lantus 24 U QD, Jardiance 10 mg QD and Glipizide 5 mg BID  She used to take Lantus BID, but has had QD dosing recently - she would need to be on BID dosing  Added Ozempic - plan to increase dose as tolerated Discontinued NovoLog in the last visit as she had not required it a lot of times, switched to mealtime glipizide and strictly advised to take it just before meals Had referred to endocrinology Advised to follow diabetic diet On statin F/u CMP and lipid panel Diabetic eye exam: Advised to follow up with Ophthalmology for diabetic eye exam  Can consider Cymbalta for diabetic neuropathy, would also help with chronic fatigue - she prefers to discuss it later Avoid  skipping any meal. Advised to keep snacks or orange juice accessible most of the time. Gvoke hypopen for hypoglycemia.      Relevant Medications   insulin glargine (LANTUS SOLOSTAR) 100 UNIT/ML Solostar Pen   Semaglutide,0.25 or 0.5MG /DOS, (OZEMPIC, 0.25 OR 0.5 MG/DOSE,) 2 MG/3ML SOPN   Glucagon (GVOKE HYPOPEN 2-PACK) 1 MG/0.2ML SOAJ   blood glucose meter kit and supplies     Meds ordered this encounter  Medications   DISCONTD: blood glucose meter kit and supplies    Sig: Dispense based on patient and insurance preference. Use up to four times daily as directed.(FOR E11.40).    Dispense:  1 each    Refill:  0    Order Specific Question:   Number of strips    Answer:   100    Order Specific Question:   Number of lancets    Answer:   100   insulin glargine (LANTUS SOLOSTAR) 100 UNIT/ML Solostar Pen    Sig: Inject 24 Units into the skin 2 (two) times daily.    Dispense:  30 mL    Refill:  3   Semaglutide,0.25 or 0.5MG /DOS, (OZEMPIC, 0.25 OR 0.5 MG/DOSE,) 2 MG/3ML SOPN    Sig: Inject 0.25 mg into the skin every 7 (seven) days for 28 days, THEN 0.5 mg every 7 (seven) days for 28 days.    Dispense:  3 mL    Refill:  1   Glucagon (GVOKE HYPOPEN 2-PACK) 1 MG/0.2ML SOAJ    Sig: Inject 1 mg into the skin as needed (Blood glucose < 53).    Dispense:  0.4 mL    Refill:  5   blood glucose meter kit and supplies    Sig: Dispense based on patient and insurance preference. Use up to four times daily as directed.(FOR E11.40).    Dispense:  1 each    Refill:  0    Order Specific Question:   Number of strips    Answer:   100    Order Specific Question:   Number of lancets    Answer:   100     Follow-up: Return in about 6 weeks (around 08/03/2023) for DM.    Anabel Halon, MD

## 2023-06-23 NOTE — Assessment & Plan Note (Signed)
Well-controlled with Losartan, Coreg, Spironolactone and Lasix

## 2023-06-23 NOTE — Assessment & Plan Note (Signed)
On ARB, Spironolactone, Jardiance and Lasix Appears euvolemic currently

## 2023-06-23 NOTE — Assessment & Plan Note (Addendum)
Lab Results  Component Value Date   HGBA1C >15.5 (H) 06/13/2023   Uncontrolled likely due to noncompliance to diet and insulin regimen On Lantus 24 U QD, Jardiance 10 mg QD and Glipizide 5 mg BID  She used to take Lantus BID, but has had QD dosing recently - she would need to be on BID dosing  Added Ozempic - plan to increase dose as tolerated Discontinued NovoLog in the last visit as she had not required it a lot of times, switched to mealtime glipizide and strictly advised to take it just before meals Had referred to endocrinology Advised to follow diabetic diet On statin F/u CMP and lipid panel Diabetic eye exam: Advised to follow up with Ophthalmology for diabetic eye exam  Can consider Cymbalta for diabetic neuropathy, would also help with chronic fatigue - she prefers to discuss it later Avoid skipping any meal. Advised to keep snacks or orange juice accessible most of the time. Gvoke hypopen for hypoglycemia.

## 2023-06-27 ENCOUNTER — Ambulatory Visit (INDEPENDENT_AMBULATORY_CARE_PROVIDER_SITE_OTHER): Payer: Medicare HMO

## 2023-06-27 DIAGNOSIS — I5022 Chronic systolic (congestive) heart failure: Secondary | ICD-10-CM

## 2023-06-27 DIAGNOSIS — Z95810 Presence of automatic (implantable) cardiac defibrillator: Secondary | ICD-10-CM

## 2023-06-29 NOTE — Progress Notes (Signed)
EPIC Encounter for ICM Monitoring  Patient Name: [name] is a 72 y.o. female Date: 06/29/2023 Primary Care Physican: Anabel Halon, MD Primary Cardiologist: Branch Electrophysiologist: Mealor Bi-V Pacing:  >99%        06/14/2023 Weight: 190 lbs 06/29/2023 Weight: 189-190                                                        Spoke with patient and heart failure questions reviewed.  Transmission results reviewed.  Pt reports she is feeling better and fluid symptoms have resolved.     Corvue Thoracic impedance suggesting fluid levels returned to normal 10/8.   Prescribed:  Furosemide 20 mg take 1 tablet by mouth every other day.   Spironolactone 25 mg take 1 tablet daily.   Labs: 06/13/2023 Creatinine 0.86,BUN 15, Potassium 4.7, Sodium 133, GFR 71, Glucose 394 10/13/2022 Creatinine 0.94, BUN 19, Potassium 4.1, Sodium 133, GFR >60 09/27/2022 Creatinine 0.96, BUN 24, Potassium 5.0, Sodium 137, GFR 62 A complete set of results can be found in Results Review.   Recommendations:  No changes and encouraged to call if experiencing any fluid symptoms.   Follow-up plan: ICM clinic phone appointment on 07/18/2023.    91 day device clinic remote transmission 08/08/2023.        EP/Cardiology Office Visits:   11/23/2023 with Dr Wyline Mood.  11/04/2023 with Dr Nelly Laurence.   Copy of ICM check sent to Dr. Nelly Laurence.  3 month ICM trend: 06/29/2023.    12-14 Month ICM trend:     Karie Soda, RN 06/29/2023 3:43 PM

## 2023-07-18 ENCOUNTER — Ambulatory Visit: Payer: Medicare HMO | Attending: Cardiovascular Disease

## 2023-07-18 DIAGNOSIS — Z95810 Presence of automatic (implantable) cardiac defibrillator: Secondary | ICD-10-CM | POA: Diagnosis not present

## 2023-07-18 DIAGNOSIS — I5022 Chronic systolic (congestive) heart failure: Secondary | ICD-10-CM | POA: Diagnosis not present

## 2023-07-20 NOTE — Progress Notes (Signed)
EPIC Encounter for ICM Monitoring  Patient Name: [name] is a 72 y.o. female Date: 07/20/2023 Primary Care Physican: Anabel Halon, MD Primary Cardiologist: Branch Electrophysiologist: Mealor Bi-V Pacing:  >99%        06/14/2023 Weight: 190 lbs 06/29/2023 Weight: 189-190 07/20/2023 Weight: 189-190 lbs                                                        Spoke with patient and heart failure questions reviewed.  Transmission results reviewed.  Pt asymptomatic for fluid accumulation.  Reports feeling well at this time and voices no complaints.     Corvue Thoracic impedance suggesting possible fluid accumulation starting 10/23 and trending back to baseline.      Prescribed:  Furosemide 20 mg take 1 tablet by mouth every other day.   Spironolactone 25 mg take 1 tablet daily.   Labs: 06/13/2023 Creatinine 0.86,BUN 15, Potassium 4.7, Sodium 133, GFR 71, Glucose 394 10/13/2022 Creatinine 0.94, BUN 19, Potassium 4.1, Sodium 133, GFR >60 09/27/2022 Creatinine 0.96, BUN 24, Potassium 5.0, Sodium 137, GFR 62 A complete set of results can be found in Results Review.   Recommendations:  No changes and encouraged to call if experiencing any fluid symptoms.   Follow-up plan: ICM clinic phone appointment on 07/25/2023 to recheck fluid levels.    91 day device clinic remote transmission 08/08/2023.        EP/Cardiology Office Visits:   11/23/2023 with Dr Wyline Mood.  11/04/2023 with Dr Nelly Laurence.   Copy of ICM check sent to Dr. Nelly Laurence.  3 month ICM trend: 07/18/2023.    12-14 Month ICM trend:     Karie Soda, RN 07/20/2023 9:54 AM

## 2023-07-27 ENCOUNTER — Ambulatory Visit: Payer: Medicare HMO | Attending: Cardiovascular Disease

## 2023-07-27 DIAGNOSIS — Z95810 Presence of automatic (implantable) cardiac defibrillator: Secondary | ICD-10-CM

## 2023-07-27 DIAGNOSIS — I5022 Chronic systolic (congestive) heart failure: Secondary | ICD-10-CM

## 2023-07-27 NOTE — Progress Notes (Signed)
No ICM remote transmission received for 07/25/2023 and next ICM transmission scheduled for 08/22/2023.

## 2023-07-27 NOTE — Progress Notes (Signed)
EPIC Encounter for ICM Monitoring  Patient Name: [name] is a 72 y.o. female Date: 07/27/2023 Primary Care Physican: Anabel Halon, MD Primary Cardiologist: Branch Electrophysiologist: Mealor Bi-V Pacing:  >99%        06/14/2023 Weight: 190 lbs 06/29/2023 Weight: 189-190 07/20/2023 Weight: 189-190 lbs 07/27/2023 Weight: 189 lbs                                                        Spoke with patient and heart failure questions reviewed.  Transmission results reviewed.  Pt asymptomatic for fluid accumulation.  Reports feeling well at this time and voices no complaints.     Corvue Thoracic impedance suggesting fluid levels returned to normal.      Prescribed:  Furosemide 20 mg take 1 tablet by mouth every other day.   Spironolactone 25 mg take 1 tablet daily.   Labs: 06/13/2023 Creatinine 0.86,BUN 15, Potassium 4.7, Sodium 133, GFR 71, Glucose 394 10/13/2022 Creatinine 0.94, BUN 19, Potassium 4.1, Sodium 133, GFR >60 09/27/2022 Creatinine 0.96, BUN 24, Potassium 5.0, Sodium 137, GFR 62 A complete set of results can be found in Results Review.   Recommendations:  No changes and encouraged to call if experiencing any fluid symptoms.   Follow-up plan: ICM clinic phone appointment on 08/22/2023.    91 day device clinic remote transmission 08/08/2023.        EP/Cardiology Office Visits:   11/23/2023 with Dr Wyline Mood.  11/04/2023 with Dr Nelly Laurence.   Copy of ICM check sent to Dr. Nelly Laurence.  3 month ICM trend: 07/27/2023.    12-14 Month ICM trend:     Karie Soda, RN 07/27/2023 10:33 AM

## 2023-07-31 ENCOUNTER — Other Ambulatory Visit: Payer: Self-pay | Admitting: Cardiology

## 2023-08-04 ENCOUNTER — Encounter: Payer: Self-pay | Admitting: Internal Medicine

## 2023-08-04 ENCOUNTER — Ambulatory Visit (INDEPENDENT_AMBULATORY_CARE_PROVIDER_SITE_OTHER): Payer: Medicare HMO | Admitting: Internal Medicine

## 2023-08-04 VITALS — BP 115/73 | HR 96 | Ht 65.0 in | Wt 199.0 lb

## 2023-08-04 DIAGNOSIS — H52222 Regular astigmatism, left eye: Secondary | ICD-10-CM | POA: Diagnosis not present

## 2023-08-04 DIAGNOSIS — H65193 Other acute nonsuppurative otitis media, bilateral: Secondary | ICD-10-CM | POA: Diagnosis not present

## 2023-08-04 DIAGNOSIS — H524 Presbyopia: Secondary | ICD-10-CM | POA: Diagnosis not present

## 2023-08-04 DIAGNOSIS — E1140 Type 2 diabetes mellitus with diabetic neuropathy, unspecified: Secondary | ICD-10-CM | POA: Diagnosis not present

## 2023-08-04 DIAGNOSIS — E1165 Type 2 diabetes mellitus with hyperglycemia: Secondary | ICD-10-CM

## 2023-08-04 DIAGNOSIS — I10 Essential (primary) hypertension: Secondary | ICD-10-CM | POA: Diagnosis not present

## 2023-08-04 DIAGNOSIS — Z794 Long term (current) use of insulin: Secondary | ICD-10-CM

## 2023-08-04 MED ORDER — AZITHROMYCIN 250 MG PO TABS: ORAL_TABLET | ORAL | 0 refills | Status: AC

## 2023-08-04 MED ORDER — GLIPIZIDE 5 MG PO TABS: 5.0000 mg | ORAL_TABLET | Freq: Two times a day (BID) | ORAL | 3 refills | Status: DC

## 2023-08-04 MED ORDER — SEMAGLUTIDE (1 MG/DOSE) 4 MG/3ML ~~LOC~~ SOPN: 1.0000 mg | PEN_INJECTOR | SUBCUTANEOUS | 0 refills | Status: DC

## 2023-08-04 NOTE — Progress Notes (Signed)
Established Patient Office Visit  Subjective:  Patient ID: [name], female    DOB: 07/20/1951  Age: 72 y.o. MRN: [id number]  CC:  Chief Complaint  Patient presents with   Diabetes    Six week follow up     HPI [name] is a 72 y.o. female with past medical history of chronic combined systolic and diastolic CHF, ischemic cardiomyopathy status post ICD placement, hypertension, hyperlipidemia, type 2 diabetes mellitus, obstructive sleep apnea on BiPAP at night, obesity, coronary artery disease, GERD and hypothyroidism who presents for f/u of her chronic medical conditions.  Type II DM: She checks her blood glucose regularly with a new glucometer.  Her blood glucose has been improving since starting Ozempic and taking Lantus 24 units BID now instead of QD.  In the last 2 weeks, her blood glucose has been around 120-150 with few readings above 200.  She denies major symptoms from Ozempic.  She is also taking glipizide 5 mg twice daily. Her last HbA1c was >15.5 in 09/24. Previous HbA1C was 7.1 in 05/24.  She has not noticed any episodes of hypoglycemia since decreasing the dose of Lantus in the last visit. She has chronic fatigue and hypersomnolence, but denies any polyuria or polyphagia.  She admits that she had been eating sweets and needs to cut down.  She reports chronic fatigue and visual disturbance -blurry vision.  Of note, she has a history of type II DM and has not seen Ophthalmologist for it recently.  Denies any redness or discharge from the eyes.  Denies any eye pain currently.  HTN and CHF: Her blood pressure has been stable at home. Takes medications regularly. Patient denies headache, dizziness, chest pain.  She has intermittent leg swelling.  She has AICD in place, for followed by cardiology and EP cardiology.  Hypothyroidism: She has been taking levothyroxine 50 mcg daily.  She has chronic fatigue, but denies any recent change in weight.  Gait imbalance: She has  chronic balance problems.  She uses walker for support.  She also has history of diabetic neuropathy.  She reports bilateral ear pain for the last 2 weeks.  She has had recurrent sinusitis in the past.  Denies any fever or chills currently.  Past Medical History:  Diagnosis Date   Anemia    Arthritis    CAD (coronary artery disease)    a. LHC (03/2014) Lmain: nl, LAD: diff dz proximal 20%, 30-40% dz mid vessel prior 2nd diagonal, LCx: 40% in OM1, 20-30% in OM2, RCA: 99% subtotal occlusion @ crux, TIMI 1 flow, L-R collaterals to distal vessel   Cardiomyopathy (HCC) 03/28/2014   LV dysfunction out of proportion to CAD 03/28/14   CHF (congestive heart failure) (HCC)    Phreesia 10/03/2020   Chronic systolic heart failure (HCC)    a. ECHO (03/2014): EF 25-30%, akinesis enteroanteroseptal myocardium, grade III DD b. RHC (03/2014) RA 4, RV 42/4, PA 44/14 (26), PCWP 21, PA 62% Fick CO/CI 6.9 / 3.4   Diabetes mellitus    Diabetes mellitus without complication (HCC)    Phreesia 10/03/2020   Duodenal ulcer    Age 25   Dyspnea    Erosive esophagitis    Gastritis    Hypertension    Hypothyroidism    Obese    Short-term memory loss    Thyroid disease    TTP (thrombotic thrombocytopenic purpura) (HCC) 06/2005    Past Surgical History:  Procedure Laterality Date   BIV ICD INSERTION  CRT-D N/A 05/03/2019   Procedure: BIV ICD INSERTION CRT-D;  Surgeon: Hillis Range, MD;  Location: [facility] INVASIVE CV LAB;  Service: Cardiovascular;  Laterality: N/A;   CARDIAC CATHETERIZATION     COLONOSCOPY  08/29/2012   [id number] diverticulosis   COLONOSCOPY N/A 05/28/2015   Procedure: COLONOSCOPY;  Surgeon: Corbin Ade, MD;  Location: [facility] ENDO SUITE;  Service: Endoscopy;  Laterality: N/A;  1015   ECTOPIC PREGNANCY SURGERY     ESOPHAGOGASTRODUODENOSCOPY  04/20/2012   [id number] antral gastritis/Erosive reflux esophagitis   LEFT AND RIGHT HEART CATHETERIZATION WITH CORONARY ANGIOGRAM N/A 03/28/2014   Procedure:  LEFT AND RIGHT HEART CATHETERIZATION WITH CORONARY ANGIOGRAM;  Surgeon: Peter M Swaziland, MD;  Location: [facility] CATH LAB;  Service: Cardiovascular;  Laterality: N/A;   SPLENECTOMY  07/20/2005    Family History  Problem Relation Age of Onset   Heart failure Mother    Cirrhosis Father        ETOH   Heart disease Brother    Diabetes Daughter    Breast cancer Maternal Aunt    Colon cancer Neg Hx     Social History   Socioeconomic History   Marital status: Single    Spouse name: Not on file   Number of children: 3   Years of education: 12   Highest education level: Some college, no degree  Occupational History   Occupation: disabled    Associate Professor: NOT EMPLOYED  Tobacco Use   Smoking status: Former    Current packs/day: 0.00    Average packs/day: 0.3 packs/day for 12.0 years (3.0 ttl pk-yrs)    Types: Cigarettes    Start date: 09/20/1966    Quit date: 09/20/1978    Years since quitting: 44.9   Smokeless tobacco: Never  Vaping Use   Vaping status: Never Used  Substance and Sexual Activity   Alcohol use: No    Alcohol/week: 0.0 standard drinks of alcohol   Drug use: No   Sexual activity: Not Currently  Other Topics Concern   Not on file  Social History Narrative   Lives Alone       Social Determinants of Health   Financial Resource Strain: Low Risk  (12/06/2022)   Overall Financial Resource Strain (CARDIA)    Difficulty of Paying Living Expenses: Not hard at all  Food Insecurity: No Food Insecurity (12/06/2022)   Hunger Vital Sign    Worried About Running Out of Food in the Last Year: Never true    Ran Out of Food in the Last Year: Never true  Transportation Needs: No Transportation Needs (12/06/2022)   PRAPARE - Administrator, Civil Service (Medical): No    Lack of Transportation (Non-Medical): No  Physical Activity: Sufficiently Active (12/06/2022)   Exercise Vital Sign    Days of Exercise per Week: 5 days    Minutes of Exercise per Session: 30 min  Stress: No  Stress Concern Present (12/06/2022)   [employer] of Occupational Health - Occupational Stress Questionnaire    Feeling of Stress : Not at all  Social Connections: Moderately Integrated (12/06/2022)   Social Connection and Isolation Panel [NHANES]    Frequency of Communication with Friends and Family: More than three times a week    Frequency of Social Gatherings with Friends and Family: More than three times a week    Attends Religious Services: More than 4 times per year    Active Member of [employer] or Organizations: Yes    Attends Banker Meetings:  More than 4 times per year    Marital Status: Divorced  Intimate Partner Violence: Not At Risk (12/06/2022)   Humiliation, Afraid, Rape, and Kick questionnaire    Fear of Current or Ex-Partner: No    Emotionally Abused: No    Physically Abused: No    Sexually Abused: No    Outpatient Medications Prior to Visit  Medication Sig Dispense Refill   acetaminophen (TYLENOL) 500 MG tablet Take 1,000 mg by mouth every 6 (six) hours as needed for moderate pain.     aspirin 81 MG chewable tablet Chew 1 tablet (81 mg total) by mouth daily.     atorvastatin (LIPITOR) 80 MG tablet TAKE 1 TABLET EVERY DAY 90 tablet 3   blood glucose meter kit and supplies Dispense based on patient and insurance preference. Use up to four times daily as directed.(FOR E11.40). 1 each 0   carvedilol (COREG) 12.5 MG tablet TAKE 1 TABLET TWICE DAILY (DOSE DECREASE) 180 tablet 3   Ciclopirox 0.77 % gel APPLY ONE TIME DAILY ON THE AFFECTED TOE NAIL (Patient taking differently: Apply 1 application  topically 2 (two) times daily as needed (affected toe nail). APPLY ONE TIME DAILY ON THE AFFECTED TOE NAIL.) 30 g 0   empagliflozin (JARDIANCE) 10 MG TABS tablet Take 1 tablet (10 mg total) by mouth daily before breakfast. 14 tablet 0   furosemide (LASIX) 20 MG tablet TAKE 1 TABLET EVERY DAY 90 tablet 1   Glucagon (GVOKE HYPOPEN 2-PACK) 1 MG/0.2ML SOAJ Inject 1 mg into the  skin as needed (Blood glucose < 53). 0.4 mL 5   insulin glargine (LANTUS SOLOSTAR) 100 UNIT/ML Solostar Pen Inject 24 Units into the skin 2 (two) times daily. 30 mL 3   Insulin Pen Needle 31G X 5 MM MISC Use as directed for Lantus and Novolog. 100 each 5   levothyroxine (SYNTHROID) 50 MCG tablet TAKE 1 TABLET EVERY DAY BEFORE BREAKFAST 90 tablet 3   losartan (COZAAR) 25 MG tablet TAKE 1 TABLET EVERY DAY 90 tablet 3   Multiple Vitamin (MULTIVITAMIN) tablet Take 1 tablet by mouth daily.     ondansetron (ZOFRAN) 4 MG tablet Take 1 tablet (4 mg total) by mouth every 6 (six) hours. 10 tablet 0   polyethylene glycol (MIRALAX / GLYCOLAX) packet Take 17 g by mouth daily as needed for mild constipation.      spironolactone (ALDACTONE) 25 MG tablet TAKE 1 TABLET EVERY DAY 90 tablet 3   TRUE METRIX BLOOD GLUCOSE TEST test strip USE AS DIRECTED TO TEST BLOOD SUGAR UP TO FOUR TIMES DAILY 400 strip 3   TRUEplus Lancets 33G MISC USE AS DIRECTED TO TEST BLOOD SUGAR UP TO FOUR TIMES DAILY 400 each 3   glipiZIDE (GLUCOTROL) 5 MG tablet Take 1 tablet (5 mg total) by mouth 2 (two) times daily before a meal. 60 tablet 3   Semaglutide,0.25 or 0.5MG /DOS, (OZEMPIC, 0.25 OR 0.5 MG/DOSE,) 2 MG/3ML SOPN Inject 0.25 mg into the skin every 7 (seven) days for 28 days, THEN 0.5 mg every 7 (seven) days for 28 days. 3 mL 1   No facility-administered medications prior to visit.    Allergies  Allergen Reactions   Other Other (See Comments)    FLAGYL, CIPRO, PROTONIX taken at the same time caused patient to lose her breath and have trouble breathing, requiring hospital stay at [facility], 03/23/14-per patient.    Lisinopril Cough    ROS Review of Systems  Constitutional:  Positive for fatigue. Negative for  chills and fever.  HENT:  Positive for ear discharge and ear pain. Negative for congestion and sore throat.   Eyes:  Positive for visual disturbance. Negative for pain and discharge.  Respiratory:  Negative for cough and shortness  of breath.   Cardiovascular:  Positive for leg swelling. Negative for chest pain and palpitations.  Gastrointestinal:  Negative for abdominal pain, diarrhea, nausea and vomiting.  Endocrine: Negative for polydipsia and polyuria.  Genitourinary:  Negative for dysuria and hematuria.  Musculoskeletal:  Positive for arthralgias and back pain. Negative for neck pain and neck stiffness.  Skin:  Negative for rash.  Neurological:  Positive for weakness. Negative for speech difficulty, numbness and headaches.  Psychiatric/Behavioral:  Negative for agitation and behavioral problems.       Objective:    Physical Exam Vitals reviewed.  Constitutional:      General: She is not in acute distress.    Appearance: She is not diaphoretic.  HENT:     Head: Normocephalic and atraumatic.     Right Ear: A middle ear effusion is present.     Left Ear: Tenderness present. A middle ear effusion is present.     Nose: Nose normal. No congestion.     Mouth/Throat:     Mouth: Mucous membranes are moist.     Pharynx: No posterior oropharyngeal erythema.  Eyes:     General: No scleral icterus.    Extraocular Movements: Extraocular movements intact.  Cardiovascular:     Rate and Rhythm: Normal rate and regular rhythm.     Pulses: Normal pulses.     Heart sounds: Normal heart sounds. No murmur heard. Pulmonary:     Breath sounds: Normal breath sounds. No wheezing or rales.  Musculoskeletal:     Cervical back: Neck supple. No tenderness.     Right lower leg: No edema.     Left lower leg: No edema.  Skin:    General: Skin is warm.     Findings: No rash.  Neurological:     General: No focal deficit present.     Mental Status: She is alert and oriented to person, place, and time.     Sensory: No sensory deficit.     Motor: Weakness (B/l LE - 3/5) present.  Psychiatric:        Mood and Affect: Mood normal.        Behavior: Behavior normal.     BP 115/73 (BP Location: Left Arm, Patient Position:  Sitting, Cuff Size: Large)   Pulse 96   Ht 5\' 5"  (1.651 m)   Wt 199 lb (90.3 kg)   SpO2 95%   BMI 33.12 kg/m  Wt Readings from Last 3 Encounters:  08/04/23 199 lb (90.3 kg)  06/22/23 190 lb (86.2 kg)  06/13/23 190 lb 6.4 oz (86.4 kg)    Lab Results  Component Value Date   TSH 1.740 06/13/2023   Lab Results  Component Value Date   WBC 5.7 06/13/2023   HGB 11.3 06/13/2023   HCT 36.2 06/13/2023   MCV 90 06/13/2023   PLT 150 06/13/2023   Lab Results  Component Value Date   NA 133 (L) 06/13/2023   K 4.7 06/13/2023   CO2 23 06/13/2023   GLUCOSE 394 (H) 06/13/2023   BUN 15 06/13/2023   CREATININE 0.86 06/13/2023   BILITOT 0.5 06/13/2023   ALKPHOS 82 06/13/2023   AST 17 06/13/2023   ALT 19 06/13/2023   PROT 7.6 06/13/2023   ALBUMIN 3.7 (L)  06/13/2023   CALCIUM 9.7 06/13/2023   ANIONGAP 7 10/13/2022   EGFR 71 06/13/2023   Lab Results  Component Value Date   CHOL 120 06/13/2023   Lab Results  Component Value Date   HDL 37 (L) 06/13/2023   Lab Results  Component Value Date   LDLCALC 58 06/13/2023   Lab Results  Component Value Date   TRIG 145 06/13/2023   Lab Results  Component Value Date   CHOLHDL 3.2 06/13/2023   Lab Results  Component Value Date   HGBA1C >15.5 (H) 06/13/2023      Assessment & Plan:   Problem List Items Addressed This Visit       Cardiovascular and Mediastinum   HTN (hypertension)    BP Readings from Last 1 Encounters:  08/04/23 115/73   Well-controlled with Losartan, Coreg, Spironolactone and Lasix Counseled for compliance with the medications Advised DASH diet and moderate exercise/walking as tolerated         Endocrine   Type 2 diabetes mellitus with diabetic neuropathy (HCC)    Can consider Cymbalta for diabetic neuropathy, would also help with chronic fatigue - she prefers to discuss it later      Relevant Medications   Semaglutide, 1 MG/DOSE, 4 MG/3ML SOPN   glipiZIDE (GLUCOTROL) 5 MG tablet   Type 2 diabetes  mellitus with hyperglycemia, with long-term current use of insulin (HCC) - Primary    Lab Results  Component Value Date   HGBA1C >15.5 (H) 06/13/2023   Uncontrolled, was likely due to noncompliance to diet and insulin regimen - improving now On Lantus 24 U BID, Ozempic 0.5 mg qw, Jardiance 10 mg QD and Glipizide 5 mg BID   Added Ozempic in the last visit - plan to increase dose as tolerated, now 1 mg QW Had referred to endocrinology, but did not follow up Advised to follow diabetic diet On statin F/u CMP and lipid panel Diabetic eye exam: Advised to follow up with Ophthalmology for diabetic eye exam  Avoid skipping any meal. Advised to keep snacks or orange juice accessible most of the time. Gvoke hypopen for hypoglycemia.      Relevant Medications   Semaglutide, 1 MG/DOSE, 4 MG/3ML SOPN   glipiZIDE (GLUCOTROL) 5 MG tablet   Other Relevant Orders   CMP14+EGFR   Hemoglobin A1c     Nervous and Auditory   Acute mucoid otitis media of both ears    Considering middle ear effusion bilaterally and tenderness of left external ear, started empiric oral azithromycin If persistent symptoms, will refer to ENT specialist      Relevant Medications   azithromycin (ZITHROMAX) 250 MG tablet      Meds ordered this encounter  Medications   Semaglutide, 1 MG/DOSE, 4 MG/3ML SOPN    Sig: Inject 1 mg as directed once a week.    Dispense:  9 mL    Refill:  0   glipiZIDE (GLUCOTROL) 5 MG tablet    Sig: Take 1 tablet (5 mg total) by mouth 2 (two) times daily before a meal.    Dispense:  60 tablet    Refill:  3    PLEASE DISCONTINUE NOVOLOG.   azithromycin (ZITHROMAX) 250 MG tablet    Sig: Take 2 tablets on day 1, then 1 tablet daily on days 2 through 5    Dispense:  6 tablet    Refill:  0     Follow-up: Return in about 2 months (around 10/04/2023) for DM.    [name]  Concha Se, MD

## 2023-08-04 NOTE — Assessment & Plan Note (Addendum)
Considering middle ear effusion bilaterally and tenderness of left external ear, started empiric oral azithromycin If persistent symptoms, will refer to ENT specialist

## 2023-08-04 NOTE — Patient Instructions (Addendum)
Please start taking Ozempic 1 mg dose once you complete the current supply.  Please take Lantus 20 U twice daily once you start Ozempic 1 mg dose.  Start taking Azithromycin for ear infection.  Please continue to take medications as prescribed.  Please continue to follow low carb diet and ambulate as tolerated.  Please get fasting blood tests done before the next visit.

## 2023-08-04 NOTE — Assessment & Plan Note (Signed)
Can consider Cymbalta for diabetic neuropathy, would also help with chronic fatigue - she prefers to discuss it later

## 2023-08-04 NOTE — Assessment & Plan Note (Addendum)
Lab Results  Component Value Date   HGBA1C >15.5 (H) 06/13/2023   Uncontrolled, was likely due to noncompliance to diet and insulin regimen - improving now On Lantus 24 U BID, Ozempic 0.5 mg qw, Jardiance 10 mg QD and Glipizide 5 mg BID   Added Ozempic in the last visit - plan to increase dose as tolerated, now 1 mg QW Had referred to endocrinology, but did not follow up Advised to follow diabetic diet On statin F/u CMP and lipid panel Diabetic eye exam: Advised to follow up with Ophthalmology for diabetic eye exam  Avoid skipping any meal. Advised to keep snacks or orange juice accessible most of the time. Gvoke hypopen for hypoglycemia.

## 2023-08-04 NOTE — Assessment & Plan Note (Addendum)
BP Readings from Last 1 Encounters:  08/04/23 115/73   Well-controlled with Losartan, Coreg, Spironolactone and Lasix Counseled for compliance with the medications Advised DASH diet and moderate exercise/walking as tolerated

## 2023-08-08 ENCOUNTER — Ambulatory Visit: Payer: Medicare HMO

## 2023-08-08 DIAGNOSIS — I5022 Chronic systolic (congestive) heart failure: Secondary | ICD-10-CM

## 2023-08-08 DIAGNOSIS — I255 Ischemic cardiomyopathy: Secondary | ICD-10-CM | POA: Diagnosis not present

## 2023-08-09 LAB — CUP PACEART REMOTE DEVICE CHECK
Battery Remaining Longevity: 23 mo
Battery Remaining Percentage: 32 %
Battery Voltage: 2.9 V
Brady Statistic AP VP Percent: 1 %
Brady Statistic AP VS Percent: 1 %
Brady Statistic AS VP Percent: 99 %
Brady Statistic AS VS Percent: 1 %
Brady Statistic RA Percent Paced: 1 %
Date Time Interrogation Session: 20241118161030
HighPow Impedance: 57 Ohm
HighPow Impedance: 57 Ohm
Implantable Lead Connection Status: 753985
Implantable Lead Connection Status: 753985
Implantable Lead Connection Status: 753985
Implantable Lead Implant Date: 20200813
Implantable Lead Implant Date: 20200813
Implantable Lead Implant Date: 20200813
Implantable Lead Location: 753858
Implantable Lead Location: 753859
Implantable Lead Location: 753860
Implantable Pulse Generator Implant Date: 20200813
Lead Channel Impedance Value: 340 Ohm
Lead Channel Impedance Value: 380 Ohm
Lead Channel Impedance Value: 390 Ohm
Lead Channel Pacing Threshold Amplitude: 0.5 V
Lead Channel Pacing Threshold Amplitude: 0.625 V
Lead Channel Pacing Threshold Amplitude: 1.25 V
Lead Channel Pacing Threshold Pulse Width: 0.4 ms
Lead Channel Pacing Threshold Pulse Width: 0.5 ms
Lead Channel Pacing Threshold Pulse Width: 1 ms
Lead Channel Sensing Intrinsic Amplitude: 11.8 mV
Lead Channel Sensing Intrinsic Amplitude: 4 mV
Lead Channel Setting Pacing Amplitude: 2 V
Lead Channel Setting Pacing Amplitude: 2 V
Lead Channel Setting Pacing Amplitude: 2.5 V
Lead Channel Setting Pacing Pulse Width: 0.5 ms
Lead Channel Setting Pacing Pulse Width: 0.8 ms
Lead Channel Setting Sensing Sensitivity: 0.5 mV
Pulse Gen Serial Number: 9891600
Zone Setting Status: 755011

## 2023-08-22 ENCOUNTER — Ambulatory Visit: Payer: Medicare HMO | Attending: Cardiovascular Disease

## 2023-08-22 DIAGNOSIS — Z95810 Presence of automatic (implantable) cardiac defibrillator: Secondary | ICD-10-CM | POA: Diagnosis not present

## 2023-08-22 DIAGNOSIS — I5022 Chronic systolic (congestive) heart failure: Secondary | ICD-10-CM | POA: Diagnosis not present

## 2023-08-24 NOTE — Progress Notes (Signed)
EPIC Encounter for ICM Monitoring  Patient Name: [name] is a 72 y.o. female Date: 08/24/2023 Primary Care Physican: Anabel Halon, MD Primary Cardiologist: Branch Electrophysiologist: Mealor Bi-V Pacing:  >99%        06/14/2023 Weight: 190 lbs 06/29/2023 Weight: 189-190 07/20/2023 Weight: 189-190 lbs 07/27/2023 Weight: 189 lbs                                                        Spoke with patient and heart failure questions reviewed.  Transmission results reviewed.  Pt asymptomatic for fluid accumulation.  Reports feeling well at this time and voices no complaints.  Discussed diet and fluid intake.   Corvue Thoracic impedance suggesting normal fluid levels with the exception of possible fluid accumulation 11/13-11/23.  Discussed impedance suggesting long period of days with possible fluid accumulation since Sept/2024.     Prescribed:  Furosemide 20 mg take 1 tablet by mouth every other day.   Spironolactone 25 mg take 1 tablet daily.   Labs: 06/13/2023 Creatinine 0.86,BUN 15, Potassium 4.7, Sodium 133, GFR 71, Glucose 394 10/13/2022 Creatinine 0.94, BUN 19, Potassium 4.1, Sodium 133, GFR >60 09/27/2022 Creatinine 0.96, BUN 24, Potassium 5.0, Sodium 137, GFR 62 A complete set of results can be found in Results Review.   Recommendations:  No changes and encouraged to call if experiencing any fluid symptoms.   Follow-up plan: ICM clinic phone appointment on 09/26/2023.    91 day device clinic remote transmission 11/07/2023.        EP/Cardiology Office Visits:   11/23/2023 with Dr Wyline Mood.  11/04/2023 with Dr Nelly Laurence.   Copy of ICM check sent to Dr. Nelly Laurence.   3 month ICM trend: 08/22/2023.    12-14 Month ICM trend:     Karie Soda, RN 08/24/2023 3:17 PM

## 2023-09-01 NOTE — Addendum Note (Signed)
Addended by: Geralyn Flash D on: 09/01/2023 03:22 PM   Modules accepted: Orders

## 2023-09-01 NOTE — Progress Notes (Signed)
Remote ICD transmission.   

## 2023-09-26 ENCOUNTER — Ambulatory Visit: Payer: Medicare HMO | Attending: Cardiovascular Disease

## 2023-09-26 DIAGNOSIS — I5022 Chronic systolic (congestive) heart failure: Secondary | ICD-10-CM | POA: Diagnosis not present

## 2023-09-26 DIAGNOSIS — Z95810 Presence of automatic (implantable) cardiac defibrillator: Secondary | ICD-10-CM | POA: Diagnosis not present

## 2023-09-27 NOTE — Progress Notes (Signed)
 EPIC Encounter for ICM Monitoring  Patient Name: [name] is a 73 y.o. female Date: 09/27/2023 Primary Care Physican: Tobie Suzzane POUR, MD Primary Cardiologist: Branch Electrophysiologist: Mealor Bi-V Pacing:  >99%        06/14/2023 Weight: 190 lbs 06/29/2023 Weight: 189-190 07/20/2023 Weight: 189-190 lbs 07/27/2023 Weight: 189 lbs 09/27/2023 Weight: 189 lbs                                                        Spoke with patient and heart failure questions reviewed.  Transmission results reviewed.  Pt asymptomatic for fluid accumulation.  Reports feeling well at this time and voices no complaints.  Discussed diet and fluid intake.   Corvue Thoracic impedance suggesting possible fluid accumulation 12/25.     Prescribed:  Furosemide  20 mg take 1 tablet by mouth every other day.   Spironolactone  25 mg take 1 tablet daily.   Labs: 06/13/2023 Creatinine 0.86, BUN 15, Potassium 4.7, Sodium 133, GFR 71, Glucose 394 10/13/2022 Creatinine 0.94, BUN 19, Potassium 4.1, Sodium 133, GFR >60 09/27/2022 Creatinine 0.96, BUN 24, Potassium 5.0, Sodium 137, GFR 62 A complete set of results can be found in Results Review.   Recommendations:  Advised to take extra Furosemide  20 mg x 1 day this week.     Follow-up plan: ICM clinic phone appointment on 10/05/2023 to recheck fluid levels.    91 day device clinic remote transmission 11/07/2023.        EP/Cardiology Office Visits:   11/23/2023 with Dr Alvan.  11/04/2023 with Dr Nancey.   Copy of ICM check sent to Dr. Nancey and Dr Alvan as RICK.   3 month ICM trend: 09/26/2023.    12-14 Month ICM trend:     Mitzie GORMAN Garner, RN 09/27/2023 3:38 PM

## 2023-09-28 ENCOUNTER — Telehealth: Payer: Self-pay

## 2023-09-28 NOTE — Progress Notes (Signed)
 Care Guide Pharmacy Note  09/28/2023 Name: [name] MRN: [id number] DOB: 1951/04/19  Referred By: Tobie Suzzane POUR, MD Reason for referral: Care Coordination ([facility] )   [name] is a 73 y.o. year old female who is a primary care patient of Tobie, Suzzane POUR, MD.  [name] was referred to the pharmacist for assistance related to: DMII  An unsuccessful telephone outreach was attempted today to contact the patient who was referred to the pharmacy team for assistance with diabetes. Additional attempts will be made to contact the patient.  [name] [facility]-Population [facility] Assistant [phone]

## 2023-09-30 ENCOUNTER — Telehealth: Payer: Self-pay

## 2023-09-30 NOTE — Progress Notes (Signed)
 Care Guide Pharmacy Note  09/30/2023 Name: [name] MRN: [id number] DOB: May 05, 1951  Referred By: Tobie Suzzane POUR, MD Reason for referral: Care Coordination ([facility]. )   [name] is a 73 y.o. year old female who is a primary care patient of Tobie, Suzzane POUR, MD.  [name] was referred to the pharmacist for assistance related to: DMII  A second unsuccessful telephone outreach was attempted today to contact the patient who was referred to the pharmacy team for assistance with diabetes. Additional attempts will be made to contact the patient.  [name] [facility]BCI-Population Health [facility] Assistant [phone]

## 2023-10-04 ENCOUNTER — Telehealth: Payer: Self-pay

## 2023-10-04 NOTE — Progress Notes (Signed)
 Care Guide Pharmacy Note  10/04/2023 Name: [name] MRN: [id number] DOB: 01-16-1951  Referred By: Tobie Suzzane POUR, MD Reason for referral: Care Coordination ([facility]. )   [name] is a 73 y.o. year old female who is a primary care patient of Tobie, Suzzane POUR, MD.  [name] was referred to the pharmacist for assistance related to: DMII  A third unsuccessful telephone outreach was attempted today to contact the patient who was referred to the pharmacy team for assistance with diabetes. The Population Health team is pleased to engage with this patient at any time in the future upon receipt of referral and should he/she be interested in assistance from the [facility] Health team.  [name] [phone]

## 2023-10-06 ENCOUNTER — Ambulatory Visit: Payer: Medicare HMO | Admitting: Internal Medicine

## 2023-10-07 ENCOUNTER — Telehealth: Payer: Self-pay

## 2023-10-07 ENCOUNTER — Ambulatory Visit: Payer: Medicare HMO | Attending: Cardiovascular Disease

## 2023-10-07 DIAGNOSIS — Z95810 Presence of automatic (implantable) cardiac defibrillator: Secondary | ICD-10-CM

## 2023-10-07 DIAGNOSIS — I5022 Chronic systolic (congestive) heart failure: Secondary | ICD-10-CM

## 2023-10-07 NOTE — Progress Notes (Signed)
EPIC Encounter for ICM Monitoring  Patient Name: [name] is a 73 y.o. female Date: 10/07/2023 Primary Care Physican: Anabel Halon, MD Primary Cardiologist: Branch Electrophysiologist: Mealor Bi-V Pacing:  >99%        06/14/2023 Weight: 190 lbs 06/29/2023 Weight: 189-190 07/20/2023 Weight: 189-190 lbs 07/27/2023 Weight: 189 lbs 09/27/2023 Weight: 189 lbs                                                        Attempted call to patient and unable to reach.  Left detailed message per DPR regarding transmission.  Transmission results reviewed.    Corvue Thoracic impedance suggesting fluid levels returned to normal.     Prescribed:  Furosemide 20 mg take 1 tablet by mouth every other day.   Spironolactone 25 mg take 1 tablet daily.   Labs: 06/13/2023 Creatinine 0.86, BUN 15, Potassium 4.7, Sodium 133, GFR 71, Glucose 394 10/13/2022 Creatinine 0.94, BUN 19, Potassium 4.1, Sodium 133, GFR >60 09/27/2022 Creatinine 0.96, BUN 24, Potassium 5.0, Sodium 137, GFR 62 A complete set of results can be found in Results Review.   Recommendations:  Left voice mail with ICM number and encouraged to call if experiencing any fluid symptoms.   Follow-up plan: ICM clinic phone appointment on 10/31/2023.    91 day device clinic remote transmission 11/07/2023.        EP/Cardiology Office Visits:   11/23/2023 with Dr Wyline Mood.  11/04/2023 with Dr Nelly Laurence.   Copy of ICM check sent to Dr. Nelly Laurence.  3 month ICM trend: 10/05/2023.    12-14 Month ICM trend:     Karie Soda, RN 10/07/2023 4:13 PM

## 2023-10-07 NOTE — Telephone Encounter (Signed)
Remote ICM transmission received.  Attempted call to patient regarding ICM remote transmission and left detailed message per DPR.  Left ICM phone number and advised to return call for any fluid symptoms or questions. Next ICM remote transmission scheduled 10/31/2023.

## 2023-10-13 ENCOUNTER — Ambulatory Visit: Payer: Medicare HMO | Admitting: Internal Medicine

## 2023-10-26 ENCOUNTER — Other Ambulatory Visit: Payer: Self-pay | Admitting: Internal Medicine

## 2023-10-28 ENCOUNTER — Encounter: Payer: Medicare HMO | Admitting: Cardiovascular Disease

## 2023-10-31 ENCOUNTER — Ambulatory Visit: Payer: Medicare HMO | Attending: Cardiovascular Disease

## 2023-10-31 DIAGNOSIS — I5022 Chronic systolic (congestive) heart failure: Secondary | ICD-10-CM | POA: Diagnosis not present

## 2023-10-31 DIAGNOSIS — Z95810 Presence of automatic (implantable) cardiac defibrillator: Secondary | ICD-10-CM

## 2023-11-02 NOTE — Progress Notes (Signed)
EPIC Encounter for ICM Monitoring  Patient Name: [name] is a 73 y.o. female Date: 11/02/2023 Primary Care Physican: Anabel Halon, MD Primary Cardiologist: Branch Electrophysiologist: Mealor Bi-V Pacing:  >99%        06/14/2023 Weight: 190 lbs 06/29/2023 Weight: 189-190 07/20/2023 Weight: 189-190 lbs 07/27/2023 Weight: 189 lbs 09/27/2023 Weight: 189 lbs 11/02/2023 Weight: 189 lbs                                                        Spoke with patient and heart failure questions reviewed.  Transmission results reviewed.  Pt reports weight increased to 192 lbs and swelling in feet.  Fluid symptoms have resolved.    Corvue Thoracic impedance suggesting normal fluid levels returned to normal.  1/27-2/6   Prescribed:  Furosemide 20 mg take 1 tablet by mouth every daily but she reports on 2/12 she takes every other day.  She will take it daily when she has fluid symptoms.   Spironolactone 25 mg take 1 tablet daily.   Labs: 06/13/2023 Creatinine 0.86, BUN 15, Potassium 4.7, Sodium 133, GFR 71, Glucose 394 10/13/2022 Creatinine 0.94, BUN 19, Potassium 4.1, Sodium 133, GFR >60 09/27/2022 Creatinine 0.96, BUN 24, Potassium 5.0, Sodium 137, GFR 62 A complete set of results can be found in Results Review.   Recommendations: No changes and encouraged to call if experiencing any fluid symptoms.   Follow-up plan: ICM clinic phone appointment on 12/05/2023.    91 day device clinic remote transmission 02/04/2024.        EP/Cardiology Office Visits:   11/23/2023 with Dr Wyline Mood.  11/04/2023 with Dr Nelly Laurence.   Copy of ICM check sent to Dr. Nelly Laurence.  3 month ICM trend: 10/31/2023.    12-14 Month ICM trend:     Karie Soda, RN 11/02/2023 4:46 PM

## 2023-11-04 ENCOUNTER — Encounter: Payer: Medicare HMO | Admitting: Cardiovascular Disease

## 2023-11-04 ENCOUNTER — Encounter: Payer: Self-pay | Admitting: Cardiovascular Disease

## 2023-11-04 ENCOUNTER — Ambulatory Visit: Payer: Medicare HMO | Attending: Cardiovascular Disease | Admitting: Cardiovascular Disease

## 2023-11-04 VITALS — BP 110/62 | HR 108 | Ht 65.0 in | Wt 202.0 lb

## 2023-11-04 DIAGNOSIS — I10 Essential (primary) hypertension: Secondary | ICD-10-CM

## 2023-11-04 LAB — CUP PACEART INCLINIC DEVICE CHECK
Battery Remaining Longevity: 19 mo
Brady Statistic RA Percent Paced: 0 %
Brady Statistic RV Percent Paced: 99.31 %
Date Time Interrogation Session: 20250214112016
HighPow Impedance: 56.25 Ohm
Implantable Lead Connection Status: 753985
Implantable Lead Connection Status: 753985
Implantable Lead Connection Status: 753985
Implantable Lead Implant Date: 20200813
Implantable Lead Implant Date: 20200813
Implantable Lead Implant Date: 20200813
Implantable Lead Location: 753858
Implantable Lead Location: 753859
Implantable Lead Location: 753860
Implantable Pulse Generator Implant Date: 20200813
Lead Channel Impedance Value: 350 Ohm
Lead Channel Impedance Value: 387.5 Ohm
Lead Channel Impedance Value: 387.5 Ohm
Lead Channel Pacing Threshold Amplitude: 0.625 V
Lead Channel Pacing Threshold Amplitude: 0.75 V
Lead Channel Pacing Threshold Amplitude: 0.75 V
Lead Channel Pacing Threshold Amplitude: 2 V
Lead Channel Pacing Threshold Amplitude: 2 V
Lead Channel Pacing Threshold Pulse Width: 0.4 ms
Lead Channel Pacing Threshold Pulse Width: 0.4 ms
Lead Channel Pacing Threshold Pulse Width: 0.5 ms
Lead Channel Pacing Threshold Pulse Width: 1 ms
Lead Channel Pacing Threshold Pulse Width: 1 ms
Lead Channel Sensing Intrinsic Amplitude: 11.8 mV
Lead Channel Sensing Intrinsic Amplitude: 3.9 mV
Lead Channel Setting Pacing Amplitude: 2 V
Lead Channel Setting Pacing Amplitude: 2 V
Lead Channel Setting Pacing Amplitude: 2.5 V
Lead Channel Setting Pacing Pulse Width: 0.5 ms
Lead Channel Setting Pacing Pulse Width: 1 ms
Lead Channel Setting Sensing Sensitivity: 0.5 mV
Pulse Gen Serial Number: 9891600
Zone Setting Status: 755011

## 2023-11-04 NOTE — Progress Notes (Signed)
  Electrophysiology Office Note:    Date:  11/04/2023   ID:  [name], DOB 05-Nov-1950, MRN [id number]  PCP:  Anabel Halon, MD   [facility] Providers Cardiologist:  Dina Rich, MD Electrophysiologist:  Maurice Small, MD     Referring MD: Anabel Halon, MD   History of Present Illness:    [name] is a 73 y.o. female with a medical history significant for CAD, sleep apnea, left bundle branch block, congestive heart failure with reduced EF, 25 to 30% which has improved to 40 to 45%, she has an Abbott CRT-D.     She has an Abbott CRT defibrillator placed by Dr. Johney Frame in 2020 for CHF and left bundle branch block. Barostim implant November 2022.     she has no device related complaints -- no new tenderness, drainage, redness.   EKGs/Labs/Other Studies Reviewed Today:    Echocardiogram:  TTE 02/01/2022 EF 40-45%   Monitors:   Stress testing:   Advanced imaging:   Cardiac catherization   EKG:   EKG Interpretation Date/Time:  Friday November 04 2023 10:50:24 EST Ventricular Rate:  103 PR Interval:  128 QRS Duration:  146 QT Interval:  390 QTC Calculation: 510 R Axis:   -25  Text Interpretation: Atrial-sensed ventricular-paced rhythm Biventricular pacemaker detected When compared with ECG of 29-Mar-2023 12:23, Vent. rate has increased BY   7 BPM Confirmed by York Pellant [phone]) on 11/04/2023 11:02:21 AM     Physical Exam:    VS:  BP 110/62   Pulse (!) 108   Ht 5\' 5"  (1.651 m)   Wt 202 lb (91.6 kg)   SpO2 98%   BMI 33.61 kg/m     Wt Readings from Last 3 Encounters:  11/04/23 202 lb (91.6 kg)  08/04/23 199 lb (90.3 kg)  06/22/23 190 lb (86.2 kg)     GEN: Well nourished, well developed in no acute distress CARDIAC: RRR, no murmurs, rubs, gallops The device site is normal -- no tenderness, edema, drainage, redness, threatened erosion. RESPIRATORY:  Normal work of breathing MUSCULOSKELETAL: trace  edema    ASSESSMENT & PLAN:    Abbott BiV ICD Normal function Device was interrogated today, I reviewed the interrogation in detail. She is not device dependent today  CHF reduced EF Continue losartan 25, spironolactone 25, carvedilol 12.5 Intolerant of Entresto Barostim in place Receiving 99% BiV pacing  Coronary disease Continue aspirin 81, Lipitor 80  History of left bundle branch block Well-managed with CRT     Signed, Maurice Small, MD  11/04/2023 11:02 AM    [facility]

## 2023-11-04 NOTE — Patient Instructions (Signed)

## 2023-11-07 ENCOUNTER — Ambulatory Visit: Payer: Medicare HMO

## 2023-11-07 DIAGNOSIS — I255 Ischemic cardiomyopathy: Secondary | ICD-10-CM | POA: Diagnosis not present

## 2023-11-07 DIAGNOSIS — I5022 Chronic systolic (congestive) heart failure: Secondary | ICD-10-CM

## 2023-11-08 ENCOUNTER — Encounter: Payer: Self-pay | Admitting: Cardiovascular Disease

## 2023-11-09 LAB — CUP PACEART REMOTE DEVICE CHECK
Battery Remaining Longevity: 22 mo
Battery Remaining Percentage: 30 %
Battery Voltage: 2.87 V
Brady Statistic AP VP Percent: 1 %
Brady Statistic AP VS Percent: 1 %
Brady Statistic AS VP Percent: 99 %
Brady Statistic AS VS Percent: 1 %
Brady Statistic RA Percent Paced: 1 %
Date Time Interrogation Session: 20250217024030
HighPow Impedance: 57 Ohm
HighPow Impedance: 57 Ohm
Implantable Lead Connection Status: 753985
Implantable Lead Connection Status: 753985
Implantable Lead Connection Status: 753985
Implantable Lead Implant Date: 20200813
Implantable Lead Implant Date: 20200813
Implantable Lead Implant Date: 20200813
Implantable Lead Location: 753858
Implantable Lead Location: 753859
Implantable Lead Location: 753860
Implantable Pulse Generator Implant Date: 20200813
Lead Channel Impedance Value: 380 Ohm
Lead Channel Impedance Value: 380 Ohm
Lead Channel Impedance Value: 390 Ohm
Lead Channel Pacing Threshold Amplitude: 0.625 V
Lead Channel Pacing Threshold Amplitude: 0.75 V
Lead Channel Pacing Threshold Amplitude: 2 V
Lead Channel Pacing Threshold Pulse Width: 0.4 ms
Lead Channel Pacing Threshold Pulse Width: 0.5 ms
Lead Channel Pacing Threshold Pulse Width: 1 ms
Lead Channel Sensing Intrinsic Amplitude: 11.8 mV
Lead Channel Sensing Intrinsic Amplitude: 4.1 mV
Lead Channel Setting Pacing Amplitude: 2 V
Lead Channel Setting Pacing Amplitude: 2 V
Lead Channel Setting Pacing Amplitude: 2.5 V
Lead Channel Setting Pacing Pulse Width: 0.5 ms
Lead Channel Setting Pacing Pulse Width: 1 ms
Lead Channel Setting Sensing Sensitivity: 0.5 mV
Pulse Gen Serial Number: 9891600
Zone Setting Status: 755011

## 2023-11-17 ENCOUNTER — Ambulatory Visit (INDEPENDENT_AMBULATORY_CARE_PROVIDER_SITE_OTHER): Payer: Medicare HMO | Admitting: Internal Medicine

## 2023-11-17 ENCOUNTER — Encounter: Payer: Self-pay | Admitting: Internal Medicine

## 2023-11-17 VITALS — BP 87/58 | HR 101 | Temp 98.5°F | Resp 16 | Ht 65.0 in | Wt 199.0 lb

## 2023-11-17 DIAGNOSIS — I10 Essential (primary) hypertension: Secondary | ICD-10-CM

## 2023-11-17 DIAGNOSIS — E039 Hypothyroidism, unspecified: Secondary | ICD-10-CM | POA: Diagnosis not present

## 2023-11-17 DIAGNOSIS — E1140 Type 2 diabetes mellitus with diabetic neuropathy, unspecified: Secondary | ICD-10-CM | POA: Diagnosis not present

## 2023-11-17 DIAGNOSIS — Z794 Long term (current) use of insulin: Secondary | ICD-10-CM | POA: Diagnosis not present

## 2023-11-17 DIAGNOSIS — J111 Influenza due to unidentified influenza virus with other respiratory manifestations: Secondary | ICD-10-CM | POA: Diagnosis not present

## 2023-11-17 DIAGNOSIS — I5042 Chronic combined systolic (congestive) and diastolic (congestive) heart failure: Secondary | ICD-10-CM | POA: Diagnosis not present

## 2023-11-17 DIAGNOSIS — E1165 Type 2 diabetes mellitus with hyperglycemia: Secondary | ICD-10-CM

## 2023-11-17 MED ORDER — PROMETHAZINE-DM 6.25-15 MG/5ML PO SYRP: 5.0000 mL | ORAL_SOLUTION | Freq: Four times a day (QID) | ORAL | 0 refills | Status: DC | PRN

## 2023-11-17 MED ORDER — OSELTAMIVIR PHOSPHATE 75 MG PO CAPS: 75.0000 mg | ORAL_CAPSULE | Freq: Two times a day (BID) | ORAL | 0 refills | Status: DC

## 2023-11-17 NOTE — Patient Instructions (Addendum)
 Please start taking Tamiflu as prescribed. Please start taking Promethazine-DM syrup as needed for cough.  Please stop taking Furosemide for the next 5 days. Maintain adequate hydration.  Please continue to take medications as prescribed.  Please continue to follow low carb diet and ambulate as tolerated.  Please get fasting blood tests done after 1 week.

## 2023-11-17 NOTE — Assessment & Plan Note (Addendum)
 Started Tamiflu Maintain adequate hydration, hold Lasix for now due to hypotension Promethazine-DM as needed for cough

## 2023-11-17 NOTE — Progress Notes (Signed)
 Established Patient Office Visit  Subjective:  Patient ID: [name], female    DOB: [birth year]  Age: 73 y.o. MRN: [id number]  CC:  Chief Complaint  Patient presents with   Cough    Started coughing yesterday and it makes her head and chest hurt, bodyaches, Reports several sick contacts with Flu. Stayed up all night coughing. Hasn't tried any medication nor has she taken her regular medication because she reports feeling so bad    HPI [name] is a 73 y.o. female with past medical history of chronic combined systolic and diastolic CHF, ischemic cardiomyopathy status post ICD placement, hypertension, hyperlipidemia, type 2 diabetes mellitus, obstructive sleep apnea on BiPAP at night, obesity, coronary artery disease, GERD and hypothyroidism who presents for f/u of her chronic medical conditions.  She complains of nasal congestion, cough with clear expectoration, myalgias and fatigue for the last 3 days.  She has been exposed to multiple influenza positive family members.  She has not tried taking any symptomatic treatment yet.  Type II DM: She checks her blood glucose regularly with a new glucometer.  Her blood glucose has been improving since starting Ozempic and taking Lantus 20 units BID now instead of QD.  In the last 2 weeks, her blood glucose has been around 120-150 with few readings above 200.  She denies major symptoms from Ozempic.  She is also taking glipizide 5 mg twice daily. Her last HbA1c was >15.5 in 09/24. Previous HbA1C was 7.1 in 05/24.  She has not noticed any episodes of hypoglycemia since decreasing the dose of Lantus in the last visit. She has chronic fatigue and hypersomnolence, but denies any polyuria or polyphagia.  She admits that she had been eating sweets and needs to cut down.  She reports chronic fatigue and visual disturbance -blurry vision.  Of note, she has a history of type II DM and has not seen Ophthalmologist for it recently.  Denies any redness or  discharge from the eyes.  Denies any eye pain currently.  HTN and CHF: Her blood pressure has been stable at home. Takes medications regularly. Patient denies headache, dizziness, chest pain.  She has intermittent leg swelling.  She has AICD in place, followed by cardiology and EP cardiology.  Hypothyroidism: She has been taking levothyroxine 50 mcg daily.  She has chronic fatigue, but denies any recent change in weight.  Gait imbalance: She has chronic balance problems.  She uses walker for support.  She also has history of diabetic neuropathy.   Past Medical History:  Diagnosis Date   Anemia    Arthritis    CAD (coronary artery disease)    a. LHC (03/2014) Lmain: nl, LAD: diff dz proximal 20%, 30-40% dz mid vessel prior 2nd diagonal, LCx: 40% in OM1, 20-30% in OM2, RCA: 99% subtotal occlusion @ crux, TIMI 1 flow, L-R collaterals to distal vessel   Cardiomyopathy (HCC) 03/28/2014   LV dysfunction out of proportion to CAD 03/28/14   CHF (congestive heart failure) (HCC)    Phreesia 10/03/2020   Chronic systolic heart failure (HCC)    a. ECHO (03/2014): EF 25-30%, akinesis enteroanteroseptal myocardium, grade III DD b. RHC (03/2014) RA 4, RV 42/4, PA 44/14 (26), PCWP 21, PA 62% Fick CO/CI 6.9 / 3.4   Diabetes mellitus    Diabetes mellitus without complication (HCC)    Phreesia 10/03/2020   Duodenal ulcer    Age 53   Dyspnea    Erosive esophagitis    Gastritis  Hypertension    Hypothyroidism    Obese    Short-term memory loss    Thyroid disease    TTP (thrombotic thrombocytopenic purpura) (HCC) 06/2005    Past Surgical History:  Procedure Laterality Date   BIV ICD INSERTION CRT-D N/A 05/03/2019   Procedure: BIV ICD INSERTION CRT-D;  Surgeon: Hillis Range, MD;  Location: [facility] INVASIVE CV LAB;  Service: Cardiovascular;  Laterality: N/A;   CARDIAC CATHETERIZATION     COLONOSCOPY  08/29/2012   BJY:[id number] diverticulosis   COLONOSCOPY N/A 05/28/2015   Procedure: COLONOSCOPY;   Surgeon: Corbin Ade, MD;  Location: [facility] ENDO [facility];  Service: Endoscopy;  Laterality: N/A;  1015   ECTOPIC PREGNANCY SURGERY     ESOPHAGOGASTRODUODENOSCOPY  04/20/2012   [id number] antral gastritis/Erosive reflux esophagitis   LEFT AND RIGHT HEART CATHETERIZATION WITH CORONARY ANGIOGRAM N/A 03/28/2014   Procedure: LEFT AND RIGHT HEART CATHETERIZATION WITH CORONARY ANGIOGRAM;  Surgeon: Peter M Swaziland, MD;  Location: [facility] CATH LAB;  Service: Cardiovascular;  Laterality: N/A;   SPLENECTOMY  07/20/2005    Family History  Problem Relation Age of Onset   Heart failure Mother    Cirrhosis Father        ETOH   Heart disease Brother    Diabetes Daughter    Breast cancer Maternal Aunt    Colon cancer Neg Hx     Social History   Socioeconomic History   Marital status: Single    Spouse name: Not on file   Number of children: 3   Years of education: 12   Highest education level: Some college, no degree  Occupational History   Occupation: disabled    Associate Professor: NOT EMPLOYED  Tobacco Use   Smoking status: Former    Current packs/day: 0.00    Average packs/day: 0.3 packs/day for 12.0 years (3.0 ttl pk-yrs)    Types: Cigarettes    Start date: 09/20/1966    Quit date: 09/20/1978    Years since quitting: 45.1   Smokeless tobacco: Never  Vaping Use   Vaping status: Never Used  Substance and Sexual Activity   Alcohol use: No    Alcohol/week: 0.0 standard drinks of alcohol   Drug use: No   Sexual activity: Not Currently  Other Topics Concern   Not on file  Social History Narrative   Lives Alone       Social Drivers of Health   Financial Resource Strain: Low Risk  (12/06/2022)   Overall Financial Resource Strain (CARDIA)    Difficulty of Paying Living Expenses: Not hard at all  Food Insecurity: No Food Insecurity (12/06/2022)   Hunger Vital Sign    Worried About Running Out of Food in the Last Year: Never true    Ran Out of Food in the Last Year: Never true  Transportation Needs: No  Transportation Needs (12/06/2022)   PRAPARE - Administrator, Civil Service (Medical): No    Lack of Transportation (Non-Medical): No  Physical Activity: Sufficiently Active (12/06/2022)   Exercise Vital Sign    Days of Exercise per Week: 5 days    Minutes of Exercise per Session: 30 min  Stress: No Stress Concern Present (12/06/2022)   [employer] of Occupational Health - Occupational Stress Questionnaire    Feeling of Stress : Not at all  Social Connections: Moderately Integrated (12/06/2022)   Social Connection and Isolation Panel [NHANES]    Frequency of Communication with Friends and Family: More than three times a week  Frequency of Social Gatherings with Friends and Family: More than three times a week    Attends Religious Services: More than 4 times per year    Active Member of Clubs or Organizations: Yes    Attends Engineer, structural: More than 4 times per year    Marital Status: Divorced  Intimate Partner Violence: Not At Risk (12/06/2022)   Humiliation, Afraid, Rape, and Kick questionnaire    Fear of Current or Ex-Partner: No    Emotionally Abused: No    Physically Abused: No    Sexually Abused: No    Outpatient Medications Prior to Visit  Medication Sig Dispense Refill   acetaminophen (TYLENOL) 500 MG tablet Take 1,000 mg by mouth every 6 (six) hours as needed for moderate pain.     aspirin 81 MG chewable tablet Chew 1 tablet (81 mg total) by mouth daily.     atorvastatin (LIPITOR) 80 MG tablet TAKE 1 TABLET EVERY DAY 90 tablet 3   blood glucose meter kit and supplies Dispense based on patient and insurance preference. Use up to four times daily as directed.(FOR E11.40). 1 each 0   carvedilol (COREG) 12.5 MG tablet TAKE 1 TABLET TWICE DAILY (DOSE DECREASE) 180 tablet 3   Ciclopirox 0.77 % gel APPLY ONE TIME DAILY ON THE AFFECTED TOE NAIL (Patient taking differently: Apply 1 application  topically 2 (two) times daily as needed (affected toe nail).  APPLY ONE TIME DAILY ON THE AFFECTED TOE NAIL.) 30 g 0   empagliflozin (JARDIANCE) 10 MG TABS tablet Take 1 tablet (10 mg total) by mouth daily before breakfast. 14 tablet 0   furosemide (LASIX) 20 MG tablet TAKE 1 TABLET EVERY DAY 90 tablet 1   glipiZIDE (GLUCOTROL) 5 MG tablet Take 1 tablet (5 mg total) by mouth 2 (two) times daily before a meal. 60 tablet 3   Glucagon (GVOKE HYPOPEN 2-PACK) 1 MG/0.2ML SOAJ Inject 1 mg into the skin as needed (Blood glucose < 53). 0.4 mL 5   insulin glargine (LANTUS SOLOSTAR) 100 UNIT/ML Solostar Pen Inject 24 Units into the skin 2 (two) times daily. 30 mL 3   Insulin Pen Needle 31G X 5 MM MISC Use as directed for Lantus and Novolog. 100 each 5   levothyroxine (SYNTHROID) 50 MCG tablet TAKE 1 TABLET EVERY DAY BEFORE BREAKFAST 90 tablet 3   losartan (COZAAR) 25 MG tablet TAKE 1 TABLET EVERY DAY 90 tablet 3   Multiple Vitamin (MULTIVITAMIN) tablet Take 1 tablet by mouth daily.     ondansetron (ZOFRAN) 4 MG tablet Take 1 tablet (4 mg total) by mouth every 6 (six) hours. 10 tablet 0   polyethylene glycol (MIRALAX / GLYCOLAX) packet Take 17 g by mouth daily as needed for mild constipation.      Semaglutide, 1 MG/DOSE, 4 MG/3ML SOPN Inject 1 mg as directed once a week. 9 mL 0   spironolactone (ALDACTONE) 25 MG tablet TAKE 1 TABLET EVERY DAY 90 tablet 3   TRUE METRIX BLOOD GLUCOSE TEST test strip TEST BLOOD SUGAR UP TO FOUR TIMES DAILY AS DIRECTED 400 strip 3   TRUEplus Lancets 33G MISC USE AS DIRECTED TO TEST BLOOD SUGAR UP TO FOUR TIMES DAILY 400 each 3   No facility-administered medications prior to visit.    Allergies  Allergen Reactions   Other Other (See Comments)    FLAGYL, CIPRO, PROTONIX taken at the same time caused patient to lose her breath and have trouble breathing, requiring hospital stay at [facility],  03/23/14-per patient.    Lisinopril Cough    ROS Review of Systems  Constitutional:  Positive for chills, fatigue and fever.  HENT:  Positive for  congestion and sore throat.   Eyes:  Positive for visual disturbance. Negative for pain and discharge.  Respiratory:  Negative for cough and shortness of breath.   Cardiovascular:  Negative for chest pain and palpitations.  Gastrointestinal:  Negative for abdominal pain, diarrhea, nausea and vomiting.  Endocrine: Negative for polydipsia and polyuria.  Genitourinary:  Negative for dysuria and hematuria.  Musculoskeletal:  Positive for arthralgias and back pain. Negative for neck pain and neck stiffness.  Skin:  Negative for rash.  Neurological:  Positive for weakness. Negative for speech difficulty and numbness.  Psychiatric/Behavioral:  Negative for agitation and behavioral problems.       Objective:    Physical Exam Vitals reviewed.  Constitutional:      General: She is not in acute distress.    Appearance: She is not diaphoretic.     Comments: Has a rolling walker  HENT:     Head: Normocephalic and atraumatic.     Right Ear: A middle ear effusion is present.     Left Ear: Tenderness present. A middle ear effusion is present.     Nose: Congestion present.     Mouth/Throat:     Mouth: Mucous membranes are moist.     Pharynx: No posterior oropharyngeal erythema.  Eyes:     General: No scleral icterus.    Extraocular Movements: Extraocular movements intact.  Cardiovascular:     Rate and Rhythm: Normal rate and regular rhythm.     Pulses: Normal pulses.     Heart sounds: Normal heart sounds. No murmur heard. Pulmonary:     Breath sounds: No wheezing or rales.  Musculoskeletal:     Cervical back: Neck supple. No tenderness.     Right lower leg: No edema.     Left lower leg: No edema.  Skin:    General: Skin is warm.     Findings: No rash.  Neurological:     General: No focal deficit present.     Mental Status: She is alert and oriented to person, place, and time.     Sensory: No sensory deficit.     Motor: Weakness (B/l LE - 3/5) present.  Psychiatric:        Mood and  Affect: Mood normal.        Behavior: Behavior normal.     BP (!) 87/58   Pulse (!) 101   Temp 98.5 F (36.9 C) (Oral)   Resp 16   Ht 5\' 5"  (1.651 m)   Wt 199 lb (90.3 kg)   SpO2 91%   BMI 33.12 kg/m  Wt Readings from Last 3 Encounters:  11/17/23 199 lb (90.3 kg)  11/04/23 202 lb (91.6 kg)  08/04/23 199 lb (90.3 kg)    Lab Results  Component Value Date   TSH 1.740 06/13/2023   Lab Results  Component Value Date   WBC 5.7 06/13/2023   HGB 11.3 06/13/2023   HCT 36.2 06/13/2023   MCV 90 06/13/2023   PLT 150 06/13/2023   Lab Results  Component Value Date   NA 133 (L) 06/13/2023   K 4.7 06/13/2023   CO2 23 06/13/2023   GLUCOSE 394 (H) 06/13/2023   BUN 15 06/13/2023   CREATININE 0.86 06/13/2023   BILITOT 0.5 06/13/2023   ALKPHOS 82 06/13/2023   AST 17 06/13/2023  ALT 19 06/13/2023   PROT 7.6 06/13/2023   ALBUMIN 3.7 (L) 06/13/2023   CALCIUM 9.7 06/13/2023   ANIONGAP 7 10/13/2022   EGFR 71 06/13/2023   Lab Results  Component Value Date   CHOL 120 06/13/2023   Lab Results  Component Value Date   HDL 37 (L) 06/13/2023   Lab Results  Component Value Date   LDLCALC 58 06/13/2023   Lab Results  Component Value Date   TRIG 145 06/13/2023   Lab Results  Component Value Date   CHOLHDL 3.2 06/13/2023   Lab Results  Component Value Date   HGBA1C >15.5 (H) 06/13/2023      Assessment & Plan:   Problem List Items Addressed This Visit       Cardiovascular and Mediastinum   HTN (hypertension)   BP Readings from Last 1 Encounters:  11/04/23 110/62   Well-controlled with Losartan, Coreg, Spironolactone and Lasix Advised to hold Lasix for now due to hypotension in the setting of viral infection Counseled for compliance with the medications Advised DASH diet and moderate exercise/walking as tolerated       Chronic combined systolic and diastolic CHF (congestive heart failure) (HCC)   On ARB, Spironolactone, Jardiance and Lasix Appears euvolemic  currently        Respiratory   Influenza   Started Tamiflu Maintain adequate hydration, hold Lasix for now due to hypotension Promethazine-DM as needed for cough      Relevant Medications   oseltamivir (TAMIFLU) 75 MG capsule   promethazine-dextromethorphan (PROMETHAZINE-DM) 6.25-15 MG/5ML syrup     Endocrine   Type 2 diabetes mellitus with diabetic neuropathy (HCC)   Can consider Cymbalta for diabetic neuropathy, would also help with chronic fatigue - she prefers to discuss it later      Hypothyroidism   Lab Results  Component Value Date   TSH 1.740 06/13/2023    On Levothyroxine 50 mcg QD Advised for compliance Follow up TSH and free T4      Relevant Orders   TSH + free T4   Type 2 diabetes mellitus with hyperglycemia, with long-term current use of insulin (HCC) - Primary   Lab Results  Component Value Date   HGBA1C >15.5 (H) 06/13/2023   Uncontrolled, was likely due to noncompliance to diet and insulin regimen - improving now On Lantus 20 U BID, Ozempic 0.5 mg qw, Jardiance 10 mg QD and Glipizide 5 mg BID  Had referred to endocrinology, but did not follow up Advised to follow diabetic diet On statin F/u CMP and lipid panel Diabetic eye exam: Advised to follow up with Ophthalmology for diabetic eye exam  Avoid skipping any meal. Advised to keep snacks or orange juice accessible most of the time. Gvoke hypopen for hypoglycemia.      Relevant Orders   CMP14+EGFR   Hemoglobin A1c   Urine Microalbumin w/creat. ratio       Meds ordered this encounter  Medications   oseltamivir (TAMIFLU) 75 MG capsule    Sig: Take 1 capsule (75 mg total) by mouth 2 (two) times daily.    Dispense:  10 capsule    Refill:  0   promethazine-dextromethorphan (PROMETHAZINE-DM) 6.25-15 MG/5ML syrup    Sig: Take 5 mLs by mouth 4 (four) times daily as needed.    Dispense:  118 mL    Refill:  0     Follow-up: Return in about 3 months (around 02/14/2024) for DM.    Anabel Halon, MD

## 2023-11-17 NOTE — Assessment & Plan Note (Signed)
 Lab Results  Component Value Date   HGBA1C >15.5 (H) 06/13/2023   Uncontrolled, was likely due to noncompliance to diet and insulin regimen - improving now On Lantus 20 U BID, Ozempic 0.5 mg qw, Jardiance 10 mg QD and Glipizide 5 mg BID  Had referred to endocrinology, but did not follow up Advised to follow diabetic diet On statin F/u CMP and lipid panel Diabetic eye exam: Advised to follow up with Ophthalmology for diabetic eye exam  Avoid skipping any meal. Advised to keep snacks or orange juice accessible most of the time. Gvoke hypopen for hypoglycemia.

## 2023-11-17 NOTE — Assessment & Plan Note (Addendum)
 BP Readings from Last 1 Encounters:  11/04/23 110/62   Well-controlled with Losartan, Coreg, Spironolactone and Lasix Advised to hold Lasix for now due to hypotension in the setting of viral infection Counseled for compliance with the medications Advised DASH diet and moderate exercise/walking as tolerated

## 2023-11-17 NOTE — Assessment & Plan Note (Signed)
 Can consider Cymbalta for diabetic neuropathy, would also help with chronic fatigue - she prefers to discuss it later

## 2023-11-17 NOTE — Assessment & Plan Note (Signed)
 Lab Results  Component Value Date   TSH 1.740 06/13/2023    On Levothyroxine 50 mcg QD Advised for compliance Follow up TSH and free T4

## 2023-11-17 NOTE — Assessment & Plan Note (Signed)
 On ARB, Spironolactone, Jardiance and Lasix Appears euvolemic currently

## 2023-11-20 ENCOUNTER — Encounter: Payer: Self-pay | Admitting: Cardiovascular Disease

## 2023-11-23 ENCOUNTER — Ambulatory Visit: Payer: Medicare HMO | Admitting: Cardiology

## 2023-11-29 DIAGNOSIS — J90 Pleural effusion, not elsewhere classified: Secondary | ICD-10-CM | POA: Diagnosis not present

## 2023-11-29 DIAGNOSIS — J9811 Atelectasis: Secondary | ICD-10-CM | POA: Diagnosis not present

## 2023-11-29 DIAGNOSIS — R0902 Hypoxemia: Secondary | ICD-10-CM | POA: Diagnosis not present

## 2023-11-29 DIAGNOSIS — R7989 Other specified abnormal findings of blood chemistry: Secondary | ICD-10-CM | POA: Diagnosis not present

## 2023-11-29 DIAGNOSIS — E119 Type 2 diabetes mellitus without complications: Secondary | ICD-10-CM | POA: Diagnosis not present

## 2023-11-29 DIAGNOSIS — R000 Tachycardia, unspecified: Secondary | ICD-10-CM | POA: Diagnosis not present

## 2023-11-29 DIAGNOSIS — J9601 Acute respiratory failure with hypoxia: Secondary | ICD-10-CM | POA: Diagnosis not present

## 2023-11-29 DIAGNOSIS — R0789 Other chest pain: Secondary | ICD-10-CM | POA: Diagnosis not present

## 2023-11-29 DIAGNOSIS — I5043 Acute on chronic combined systolic (congestive) and diastolic (congestive) heart failure: Secondary | ICD-10-CM | POA: Diagnosis not present

## 2023-11-29 DIAGNOSIS — N179 Acute kidney failure, unspecified: Secondary | ICD-10-CM | POA: Diagnosis not present

## 2023-11-29 DIAGNOSIS — E669 Obesity, unspecified: Secondary | ICD-10-CM | POA: Diagnosis not present

## 2023-11-29 DIAGNOSIS — R918 Other nonspecific abnormal finding of lung field: Secondary | ICD-10-CM | POA: Diagnosis not present

## 2023-11-29 DIAGNOSIS — I5023 Acute on chronic systolic (congestive) heart failure: Secondary | ICD-10-CM | POA: Diagnosis not present

## 2023-11-29 DIAGNOSIS — I2510 Atherosclerotic heart disease of native coronary artery without angina pectoris: Secondary | ICD-10-CM | POA: Diagnosis not present

## 2023-11-29 DIAGNOSIS — I2489 Other forms of acute ischemic heart disease: Secondary | ICD-10-CM | POA: Diagnosis not present

## 2023-11-29 DIAGNOSIS — E039 Hypothyroidism, unspecified: Secondary | ICD-10-CM | POA: Diagnosis not present

## 2023-11-29 DIAGNOSIS — I110 Hypertensive heart disease with heart failure: Secondary | ICD-10-CM | POA: Diagnosis not present

## 2023-11-29 DIAGNOSIS — E785 Hyperlipidemia, unspecified: Secondary | ICD-10-CM | POA: Diagnosis not present

## 2023-11-29 DIAGNOSIS — I509 Heart failure, unspecified: Secondary | ICD-10-CM | POA: Diagnosis not present

## 2023-11-29 DIAGNOSIS — R0600 Dyspnea, unspecified: Secondary | ICD-10-CM | POA: Diagnosis not present

## 2023-11-29 DIAGNOSIS — J189 Pneumonia, unspecified organism: Secondary | ICD-10-CM | POA: Diagnosis not present

## 2023-11-30 DIAGNOSIS — J9601 Acute respiratory failure with hypoxia: Secondary | ICD-10-CM | POA: Diagnosis not present

## 2023-11-30 DIAGNOSIS — I5043 Acute on chronic combined systolic (congestive) and diastolic (congestive) heart failure: Secondary | ICD-10-CM | POA: Diagnosis not present

## 2023-11-30 DIAGNOSIS — R7989 Other specified abnormal findings of blood chemistry: Secondary | ICD-10-CM | POA: Diagnosis not present

## 2023-12-05 ENCOUNTER — Telehealth: Payer: Self-pay

## 2023-12-05 ENCOUNTER — Ambulatory Visit: Payer: Medicare HMO | Attending: Cardiovascular Disease

## 2023-12-05 DIAGNOSIS — Z95810 Presence of automatic (implantable) cardiac defibrillator: Secondary | ICD-10-CM

## 2023-12-05 DIAGNOSIS — I5022 Chronic systolic (congestive) heart failure: Secondary | ICD-10-CM | POA: Diagnosis not present

## 2023-12-05 NOTE — Transitions of Care (Post Inpatient/ED Visit) (Signed)
 12/05/2023  Name: [name] MRN: [id number] DOB: May 08, 1951  Today's TOC FU Call Status: Today's TOC FU Call Status:: Successful TOC FU Call Completed Unsuccessful Call (1st Attempt) Date: 12/05/23 [facility] FU Call Complete Date: 12/05/23 Patient's Name and Date of Birth confirmed.  Transition Care Management Follow-up Telephone Call Date of Discharge: 12/03/23 How have you been since you were released from the hospital?: Better (Reports that she is doing well. Denies swelling. No short of breath) Any questions or concerns?: No  Items Reviewed: Did you receive and understand the discharge instructions provided?: Yes Medications obtained,verified, and reconciled?: Yes (Medications Reviewed) Any new allergies since your discharge?: No Dietary orders reviewed?: Yes Type of Diet Ordered:: low salt diet Do you have support at home?: Yes People in Home: child(ren), adult, grandchild(ren) Name of Support/Comfort Primary Source: CNA 5 days a week.  Medications Reviewed Today: Medications Reviewed Today     Reviewed by Earlie Server, RN (Registered Nurse) on 12/05/23 at 1410  Med List Status: <None>   Medication Order Taking? Sig Documenting Provider Last Dose Status Informant  acetaminophen (TYLENOL) 500 MG tablet [id number] Yes Take 1,000 mg by mouth every 6 (six) hours as needed for moderate pain. [provider] Taking Active Self  aspirin 81 MG chewable tablet [id number] Yes Chew 1 tablet (81 mg total) by mouth daily. Marinda Elk, MD Taking Active Self  atorvastatin (LIPITOR) 80 MG tablet [id number] Yes TAKE 1 TABLET EVERY DAY Branch, Dorothe Pea, MD Taking Active   blood glucose meter kit and supplies [id number] Yes Dispense based on patient and insurance preference. Use up to four times daily as directed.(FOR E11.40). Anabel Halon, MD Taking Active   carvedilol (COREG) 12.5 MG tablet [id number] Yes TAKE 1 TABLET TWICE DAILY (DOSE DECREASE) Antoine Poche, MD  Taking Active   Ciclopirox 0.77 % gel [id number] No APPLY ONE TIME DAILY ON THE AFFECTED TOE NAIL  Patient not taking: Reported on 12/05/2023   Anabel Halon, MD Not Taking Active Self  empagliflozin (JARDIANCE) 10 MG TABS tablet [id number] No Take 1 tablet (10 mg total) by mouth daily before breakfast.  Patient not taking: Reported on 12/05/2023   Hillis Range, MD Not Taking Active Self  furosemide (LASIX) 20 MG tablet [id number] Yes TAKE 1 TABLET EVERY DAY  Patient taking differently: Take 20 mg by mouth daily. Takes every other day   Antoine Poche, MD Taking Active   glipiZIDE (GLUCOTROL) 5 MG tablet [id number] Yes Take 1 tablet (5 mg total) by mouth 2 (two) times daily before a meal. Anabel Halon, MD Taking Active   Glucagon (GVOKE HYPOPEN 2-PACK) 1 MG/0.2ML SOAJ [id number] No Inject 1 mg into the skin as needed (Blood glucose < 53).  Patient not taking: Reported on 12/05/2023   Anabel Halon, MD Not Taking Active   insulin glargine (LANTUS SOLOSTAR) 100 UNIT/ML Solostar Pen [id number] Yes Inject 24 Units into the skin 2 (two) times daily.  Patient taking differently: Inject 20 Units into the skin at bedtime.   Anabel Halon, MD Taking Active   Insulin Pen Needle 31G X 5 MM MISC [id number] Yes Use as directed for Lantus and Novolog. Anabel Halon, MD Taking Active   levothyroxine (SYNTHROID) 50 MCG tablet [id number] Yes TAKE 1 TABLET EVERY DAY BEFORE BREAKFAST Anabel Halon, MD Taking Active   losartan (COZAAR) 25 MG tablet [id number] Yes TAKE 1 TABLET EVERY DAY Branch, Dorothe Pea, MD Taking Active   Multiple Vitamin (  MULTIVITAMIN) tablet [id number] Yes Take 1 tablet by mouth daily. [provider] Taking Active Self  ondansetron (ZOFRAN) 4 MG tablet [id number] No Take 1 tablet (4 mg total) by mouth every 6 (six) hours.  Patient not taking: Reported on 12/05/2023   Su Monks, PA-C Not Taking Active   oseltamivir (TAMIFLU) 75 MG capsule [id number] No Take 1 capsule (75  mg total) by mouth 2 (two) times daily.  Patient not taking: Reported on 12/05/2023   Anabel Halon, MD Not Taking Active   polyethylene glycol [facility] / GLYCOLAX) packet [id number] Yes Take 17 g by mouth daily as needed for mild constipation.  [provider] Taking Active Self  promethazine-dextromethorphan (PROMETHAZINE-DM) 6.25-15 MG/5ML syrup [id number] No Take 5 mLs by mouth 4 (four) times daily as needed.  Patient not taking: Reported on 12/05/2023   Anabel Halon, MD Not Taking Active   Semaglutide, 1 MG/DOSE, 4 MG/3ML SOPN [id number] No Inject 1 mg as directed once a week.  Patient not taking: Reported on 12/05/2023   Anabel Halon, MD Not Taking Active   spironolactone (ALDACTONE) 25 MG tablet [id number] Yes TAKE 1 TABLET EVERY DAY Branch, Dorothe Pea, MD Taking Active   TRUE METRIX BLOOD GLUCOSE TEST test strip [id number] Yes TEST BLOOD SUGAR UP TO FOUR TIMES DAILY AS DIRECTED Anabel Halon, MD Taking Active   TRUEplus Lancets 33G MISC [id number] Yes USE AS DIRECTED TO TEST BLOOD SUGAR UP TO FOUR TIMES DAILY Anabel Halon, MD Taking Active             Home Care and Equipment/Supplies: Were Home Health Services Ordered?: No Any new equipment or medical supplies ordered?: No  Functional Questionnaire: Do you need assistance with bathing/showering or dressing?: Yes (CNA) Do you need assistance with meal preparation?: Yes (CNA) Do you need assistance with eating?: No Do you have difficulty maintaining continence: No Do you need assistance with getting out of bed/getting out of a chair/moving?: No Do you have difficulty managing or taking your medications?: No  Follow up appointments reviewed: PCP Follow-up appointment confirmed?: No (Patient to call) MD Provider Line Number:[phone] Given: No Specialist Hospital Follow-up appointment confirmed?: Yes Date of Specialist follow-up appointment?: 12/07/23 Follow-Up Specialty Provider:: Dr. Wyline Mood. cardiology Do  you need transportation to your follow-up appointment?: No Do you understand care options if your condition(s) worsen?: Yes-patient verbalized understanding  SDOH Interventions Today    Flowsheet Row Most Recent Value  SDOH Interventions   Food Insecurity Interventions Intervention Not Indicated  Housing Interventions Intervention Not Indicated  Transportation Interventions Intervention Not Indicated  Utilities Interventions Intervention Not Indicated      Interventions Today    Flowsheet Row Most Recent Value  Chronic Disease   Chronic disease during today's visit Congestive Heart Failure (CHF)  General Interventions   General Interventions Discussed/Reviewed General Interventions Discussed, General Interventions Reviewed  Education Interventions   Education Provided Provided Education  [encouraged patient to call PCP office and scheduled follow up.  Reviewed heart failure zones and when to call. Encouraged patinet to weigh daily.]  Provided Verbal Education On Nutrition, Blood Sugar Monitoring, When to see the doctor, Medication  Nutrition Interventions   Nutrition Discussed/Reviewed Nutrition Discussed, Decreasing salt  Pharmacy Interventions   Pharmacy Dicussed/Reviewed Medications and their functions      TOC Interventions Today    Flowsheet Row Most Recent Value  TOC Interventions   TOC Interventions Discussed/Reviewed TOC Interventions Discussed, TOC Interventions Reviewed, Post discharge activity limitations per provider  Patient reports that she is doing very well. Reports that she has a CNA 7 days a week to assist her with any needs. Also states that she has a supportive family.  Reviewed with patient the importance of daily weights. Patient did not weigh this am.  Reviewed when to call MD for weight gain. Reviewed importance of following her low salt diet.    Reviewed and offer 30 day TOC program and patient declined. I provided my contact information for  patient to call me if needed.   Lonia Chimera, RN, BSN, CEN [employer]- Transition of Care Team.  [facility] [phone]

## 2023-12-05 NOTE — Progress Notes (Signed)
 EPIC Encounter for ICM Monitoring  Patient Name: [name] is a 73 y.o. female Date: 12/05/2023 Primary Care Physican: Anabel Halon, MD Primary Cardiologist: Branch Electrophysiologist: Mealor Bi-V Pacing:  >99%        06/14/2023 Weight: 190 lbs 06/29/2023 Weight: 189-190 07/20/2023 Weight: 189-190 lbs 07/27/2023 Weight: 189 lbs 09/27/2023 Weight: 189 lbs 11/02/2023 Weight: 189 lbs                                                        Spoke with patient and heart failure questions reviewed.  Transmission results reviewed.  Pt reports she started with flu and pneumonia which triggered her CHF symptoms.   Hospitalization 3/11 -3/15 for Acute on Chronic Congestive Heart Failure.   [facility] Thoracic impedance suggesting possible fluid accumulation starting 3/10 and trending close to normal.  Hospitalization 3/11-3/15.   Prescribed:  Furosemide 20 mg take 1 tablet by mouth daily (Lasix on hold until 3/17 per discharge instructions due to drop in low BP) Spironolactone 25 mg take 1 tablet daily.   Labs: 12/03/2023 Creatinine 1.33, BUN 27, Potassium 4.2, Sodium 135, GFR 42  12/02/2023 Creatinine 1.28, BUN 27, Potassium 4.5, Sodium 137, GFR 44  12/01/2023 Creatinine 1.23, BUN 31, Potassium 4.2, Sodium 135, GFR 46  11/29/2023 Creatinine 1.31, BUN 34, Potassium 5.2, Sodium 135, GFR 43 A complete set of results can be found in Results Review.   Recommendations:  Advised to take 0.5 Lasix tablet tomorrow and then discuss Lasix dosage with Dr Wyline Mood on 3/19.   Follow-up plan: ICM clinic phone appointment on 12/12/2023 to recheck fluid levels.    91 day device clinic remote transmission 02/04/2024.        EP/Cardiology Office Visits:   12/07/2023 with Dr Wyline Mood.  Recall 10/29/2024 with Dr Nelly Laurence.   Copy of ICM check sent to Dr. Nelly Laurence and Dr Wyline Mood as Lorain Childes for 3/19 OV.  3 month ICM trend: 12/05/2023.    12-14 Month ICM trend:     Karie Soda, RN 12/05/2023 10:17 AM

## 2023-12-05 NOTE — Transitions of Care (Post Inpatient/ED Visit) (Signed)
   12/05/2023  Name: [name] MRN: [id number] DOB: [birth year]  Today's TOC FU Call Status: Today's TOC FU Call Status:: Unsuccessful Call (1st Attempt) Unsuccessful Call (1st Attempt) Date: 12/03/23  Attempted to reach the patient regarding the most recent Inpatient/ED visit.  Follow Up Plan: Additional outreach attempts will be made to reach the patient to complete the Transitions of Care (Post Inpatient/ED visit) call.   Lonia Chimera, RN, BSN, CEN [employer]- Transition of Care Team.  [facility] [phone]

## 2023-12-06 DIAGNOSIS — J9601 Acute respiratory failure with hypoxia: Secondary | ICD-10-CM | POA: Diagnosis not present

## 2023-12-06 DIAGNOSIS — N1832 Chronic kidney disease, stage 3b: Secondary | ICD-10-CM | POA: Diagnosis not present

## 2023-12-06 DIAGNOSIS — I255 Ischemic cardiomyopathy: Secondary | ICD-10-CM | POA: Diagnosis not present

## 2023-12-06 DIAGNOSIS — E1122 Type 2 diabetes mellitus with diabetic chronic kidney disease: Secondary | ICD-10-CM | POA: Diagnosis not present

## 2023-12-06 DIAGNOSIS — I5043 Acute on chronic combined systolic (congestive) and diastolic (congestive) heart failure: Secondary | ICD-10-CM | POA: Diagnosis not present

## 2023-12-06 DIAGNOSIS — J441 Chronic obstructive pulmonary disease with (acute) exacerbation: Secondary | ICD-10-CM | POA: Diagnosis not present

## 2023-12-06 DIAGNOSIS — N179 Acute kidney failure, unspecified: Secondary | ICD-10-CM | POA: Diagnosis not present

## 2023-12-06 DIAGNOSIS — I2510 Atherosclerotic heart disease of native coronary artery without angina pectoris: Secondary | ICD-10-CM | POA: Diagnosis not present

## 2023-12-06 DIAGNOSIS — I110 Hypertensive heart disease with heart failure: Secondary | ICD-10-CM | POA: Diagnosis not present

## 2023-12-07 ENCOUNTER — Telehealth: Payer: Self-pay

## 2023-12-07 ENCOUNTER — Ambulatory Visit: Admitting: Cardiology

## 2023-12-07 DIAGNOSIS — E669 Obesity, unspecified: Secondary | ICD-10-CM | POA: Diagnosis not present

## 2023-12-07 DIAGNOSIS — E039 Hypothyroidism, unspecified: Secondary | ICD-10-CM | POA: Diagnosis not present

## 2023-12-07 DIAGNOSIS — Z794 Long term (current) use of insulin: Secondary | ICD-10-CM | POA: Diagnosis not present

## 2023-12-07 DIAGNOSIS — Z79899 Other long term (current) drug therapy: Secondary | ICD-10-CM | POA: Diagnosis not present

## 2023-12-07 DIAGNOSIS — I130 Hypertensive heart and chronic kidney disease with heart failure and stage 1 through stage 4 chronic kidney disease, or unspecified chronic kidney disease: Secondary | ICD-10-CM | POA: Diagnosis not present

## 2023-12-07 DIAGNOSIS — E1122 Type 2 diabetes mellitus with diabetic chronic kidney disease: Secondary | ICD-10-CM | POA: Diagnosis not present

## 2023-12-07 DIAGNOSIS — I129 Hypertensive chronic kidney disease with stage 1 through stage 4 chronic kidney disease, or unspecified chronic kidney disease: Secondary | ICD-10-CM | POA: Diagnosis not present

## 2023-12-07 DIAGNOSIS — R059 Cough, unspecified: Secondary | ICD-10-CM | POA: Diagnosis not present

## 2023-12-07 DIAGNOSIS — R918 Other nonspecific abnormal finding of lung field: Secondary | ICD-10-CM | POA: Diagnosis not present

## 2023-12-07 DIAGNOSIS — R0689 Other abnormalities of breathing: Secondary | ICD-10-CM | POA: Diagnosis not present

## 2023-12-07 DIAGNOSIS — Z9989 Dependence on other enabling machines and devices: Secondary | ICD-10-CM | POA: Diagnosis not present

## 2023-12-07 DIAGNOSIS — I2489 Other forms of acute ischemic heart disease: Secondary | ICD-10-CM | POA: Diagnosis not present

## 2023-12-07 DIAGNOSIS — R079 Chest pain, unspecified: Secondary | ICD-10-CM | POA: Diagnosis not present

## 2023-12-07 DIAGNOSIS — R0902 Hypoxemia: Secondary | ICD-10-CM | POA: Diagnosis not present

## 2023-12-07 DIAGNOSIS — N1832 Chronic kidney disease, stage 3b: Secondary | ICD-10-CM | POA: Diagnosis not present

## 2023-12-07 DIAGNOSIS — J9600 Acute respiratory failure, unspecified whether with hypoxia or hypercapnia: Secondary | ICD-10-CM | POA: Diagnosis not present

## 2023-12-07 DIAGNOSIS — R0602 Shortness of breath: Secondary | ICD-10-CM | POA: Diagnosis not present

## 2023-12-07 DIAGNOSIS — N1831 Chronic kidney disease, stage 3a: Secondary | ICD-10-CM | POA: Diagnosis not present

## 2023-12-07 DIAGNOSIS — Z8639 Personal history of other endocrine, nutritional and metabolic disease: Secondary | ICD-10-CM | POA: Diagnosis not present

## 2023-12-07 DIAGNOSIS — Z9981 Dependence on supplemental oxygen: Secondary | ICD-10-CM | POA: Diagnosis not present

## 2023-12-07 DIAGNOSIS — Z20822 Contact with and (suspected) exposure to covid-19: Secondary | ICD-10-CM | POA: Diagnosis not present

## 2023-12-07 DIAGNOSIS — I509 Heart failure, unspecified: Secondary | ICD-10-CM | POA: Diagnosis not present

## 2023-12-07 DIAGNOSIS — M8580 Other specified disorders of bone density and structure, unspecified site: Secondary | ICD-10-CM | POA: Diagnosis not present

## 2023-12-07 DIAGNOSIS — R000 Tachycardia, unspecified: Secondary | ICD-10-CM | POA: Diagnosis not present

## 2023-12-07 DIAGNOSIS — J441 Chronic obstructive pulmonary disease with (acute) exacerbation: Secondary | ICD-10-CM | POA: Diagnosis not present

## 2023-12-07 DIAGNOSIS — I5043 Acute on chronic combined systolic (congestive) and diastolic (congestive) heart failure: Secondary | ICD-10-CM | POA: Diagnosis not present

## 2023-12-07 DIAGNOSIS — Z95810 Presence of automatic (implantable) cardiac defibrillator: Secondary | ICD-10-CM | POA: Diagnosis not present

## 2023-12-07 DIAGNOSIS — E785 Hyperlipidemia, unspecified: Secondary | ICD-10-CM | POA: Diagnosis not present

## 2023-12-07 DIAGNOSIS — I5041 Acute combined systolic (congestive) and diastolic (congestive) heart failure: Secondary | ICD-10-CM | POA: Diagnosis not present

## 2023-12-07 DIAGNOSIS — I2510 Atherosclerotic heart disease of native coronary artery without angina pectoris: Secondary | ICD-10-CM | POA: Diagnosis not present

## 2023-12-07 LAB — HEMOGLOBIN A1C: Hemoglobin A1C: 6.8

## 2023-12-07 NOTE — Progress Notes (Deleted)
 Clinical Summary Ms. Coye is a 73 y.o.female  seen today for follow up of the following meidcal problems.    1. Chronic combined systolic and diastolic heart failure - echo 01/6212 LVEF 25-30%, restrictive diastolic dysfunction. New diagnosis at that time. Repeat echo 06/2014 LVEF 40-45% - cath 03/2014 showed RCA 99% with left to right collaterals, other arteries patent. Overall LV systolic dysfunction out of proportion to CAD.  Medically managed.  - 07/2015 echo LVEF 40%, grade I diastolic dysfunction   - Entresto causes dizziness and nausea, stopped taking. Back on losartan   -  due to low bp's we lowered losartan to 25mg  daily. Lowered lasix to 20mg  daily, may take 40mg  as needed.    01/2018 echo LVEF 35%, grade I diastoilc dysfunction  - 11/2018 echo LVEF 30-35% - 05/03/19 had BIV AICD placed by Dr Nelly Laurence   11/2019 LVEF 30-35% 07/2021 barostim implant  01/2022 echo: LVEF 40-45%, grade I dd      - no SOB/DOE, no recent edema - compliant with meds - normal device check 03/2023   2. CAD - cath 03/2014  with 99% chronic RCA disease, otherwise patent vessels. Managed medically -no chest pains   3. OSA  - on bipap at night. Followed by Dr Vassie Loll, overdue for f/u   4. Hyperlipidemia   02/2020 TC 76 HDL 33 TG 75 LDL 27 - upcoming labs on Monday     5. Chronic LBBB     6. Leg pains - back of thighs and calves with walking, resolves with rest - ABIs were normal but monophasic waveforms suggesting disease. Followed by vascular - 11/2021 CTA: 1. Atherosclerotic calcified and noncalcified plaque seen throughout the superficial femoral and popliteal arteries without high-grade stenoses. 2. Left posterior tibial artery is occluded at the mid lower leg. Two vessel runoff to the left ankle. Three-vessel runoff to the right ankle.   - seen by vacular Dr Arbie Cookey, f/u just as needed.          Past Medical History:  Diagnosis Date   Anemia    Arthritis    CAD (coronary artery  disease)    a. LHC (03/2014) Lmain: nl, LAD: diff dz proximal 20%, 30-40% dz mid vessel prior 2nd diagonal, LCx: 40% in OM1, 20-30% in OM2, RCA: 99% subtotal occlusion @ crux, TIMI 1 flow, L-R collaterals to distal vessel   Cardiomyopathy (HCC) 03/28/2014   LV dysfunction out of proportion to CAD 03/28/14   CHF (congestive heart failure) (HCC)    Phreesia 10/03/2020   Chronic systolic heart failure (HCC)    a. ECHO (03/2014): EF 25-30%, akinesis enteroanteroseptal myocardium, grade III DD b. RHC (03/2014) RA 4, RV 42/4, PA 44/14 (26), PCWP 21, PA 62% Fick CO/CI 6.9 / 3.4   Diabetes mellitus    Diabetes mellitus without complication (HCC)    Phreesia 10/03/2020   Duodenal ulcer    Age 38   Dyspnea    Erosive esophagitis    Gastritis    Hypertension    Hypothyroidism    Obese    Short-term memory loss    Thyroid disease    TTP (thrombotic thrombocytopenic purpura) (HCC) 06/2005     Allergies  Allergen Reactions   Other Other (See Comments)    FLAGYL, CIPRO, PROTONIX taken at the same time caused patient to lose her breath and have trouble breathing, requiring hospital stay at [facility], 03/23/14-per patient.    Lisinopril Cough     Current Outpatient  Medications  Medication Sig Dispense Refill   acetaminophen (TYLENOL) 500 MG tablet Take 1,000 mg by mouth every 6 (six) hours as needed for moderate pain.     aspirin 81 MG chewable tablet Chew 1 tablet (81 mg total) by mouth daily.     atorvastatin (LIPITOR) 80 MG tablet TAKE 1 TABLET EVERY DAY 90 tablet 3   blood glucose meter kit and supplies Dispense based on patient and insurance preference. Use up to four times daily as directed.(FOR E11.40). 1 each 0   carvedilol (COREG) 12.5 MG tablet TAKE 1 TABLET TWICE DAILY (DOSE DECREASE) 180 tablet 3   Ciclopirox 0.77 % gel APPLY ONE TIME DAILY ON THE AFFECTED TOE NAIL (Patient not taking: Reported on 12/05/2023) 30 g 0   empagliflozin (JARDIANCE) 10 MG TABS tablet Take 1 tablet (10 mg total) by  mouth daily before breakfast. (Patient not taking: Reported on 12/05/2023) 14 tablet 0   furosemide (LASIX) 20 MG tablet TAKE 1 TABLET EVERY DAY (Patient taking differently: Take 20 mg by mouth daily. Takes every other day) 90 tablet 1   glipiZIDE (GLUCOTROL) 5 MG tablet Take 1 tablet (5 mg total) by mouth 2 (two) times daily before a meal. 60 tablet 3   Glucagon (GVOKE HYPOPEN 2-PACK) 1 MG/0.2ML SOAJ Inject 1 mg into the skin as needed (Blood glucose < 53). (Patient not taking: Reported on 12/05/2023) 0.4 mL 5   insulin glargine (LANTUS SOLOSTAR) 100 UNIT/ML Solostar Pen Inject 24 Units into the skin 2 (two) times daily. (Patient taking differently: Inject 20 Units into the skin at bedtime.) 30 mL 3   Insulin Pen Needle 31G X 5 MM MISC Use as directed for Lantus and Novolog. 100 each 5   levothyroxine (SYNTHROID) 50 MCG tablet TAKE 1 TABLET EVERY DAY BEFORE BREAKFAST 90 tablet 3   losartan (COZAAR) 25 MG tablet TAKE 1 TABLET EVERY DAY 90 tablet 3   Multiple Vitamin (MULTIVITAMIN) tablet Take 1 tablet by mouth daily.     ondansetron (ZOFRAN) 4 MG tablet Take 1 tablet (4 mg total) by mouth every 6 (six) hours. (Patient not taking: Reported on 12/05/2023) 10 tablet 0   oseltamivir (TAMIFLU) 75 MG capsule Take 1 capsule (75 mg total) by mouth 2 (two) times daily. (Patient not taking: Reported on 12/05/2023) 10 capsule 0   polyethylene glycol (MIRALAX / GLYCOLAX) packet Take 17 g by mouth daily as needed for mild constipation.      promethazine-dextromethorphan (PROMETHAZINE-DM) 6.25-15 MG/5ML syrup Take 5 mLs by mouth 4 (four) times daily as needed. (Patient not taking: Reported on 12/05/2023) 118 mL 0   Semaglutide, 1 MG/DOSE, 4 MG/3ML SOPN Inject 1 mg as directed once a week. (Patient not taking: Reported on 12/05/2023) 9 mL 0   spironolactone (ALDACTONE) 25 MG tablet TAKE 1 TABLET EVERY DAY 90 tablet 3   TRUE METRIX BLOOD GLUCOSE TEST test strip TEST BLOOD SUGAR UP TO FOUR TIMES DAILY AS DIRECTED 400 strip  3   TRUEplus Lancets 33G MISC USE AS DIRECTED TO TEST BLOOD SUGAR UP TO FOUR TIMES DAILY 400 each 3   No current facility-administered medications for this visit.     Past Surgical History:  Procedure Laterality Date   BIV ICD INSERTION CRT-D N/A 05/03/2019   Procedure: BIV ICD INSERTION CRT-D;  Surgeon: Hillis Range, MD;  Location: [facility] INVASIVE CV LAB;  Service: Cardiovascular;  Laterality: N/A;   CARDIAC CATHETERIZATION     COLONOSCOPY  08/29/2012   ZOX:WRUEAVW diverticulosis  COLONOSCOPY N/A 05/28/2015   Procedure: COLONOSCOPY;  Surgeon: Corbin Ade, MD;  Location: [facility] ENDO SUITE;  Service: Endoscopy;  Laterality: N/A;  1015   ECTOPIC PREGNANCY SURGERY     ESOPHAGOGASTRODUODENOSCOPY  04/20/2012   WUJ:[id number] antral gastritis/Erosive reflux esophagitis   LEFT AND RIGHT HEART CATHETERIZATION WITH CORONARY ANGIOGRAM N/A 03/28/2014   Procedure: LEFT AND RIGHT HEART CATHETERIZATION WITH CORONARY ANGIOGRAM;  Surgeon: Peter M Swaziland, MD;  Location: [facility] CATH LAB;  Service: Cardiovascular;  Laterality: N/A;   SPLENECTOMY  07/20/2005     Allergies  Allergen Reactions   Other Other (See Comments)    FLAGYL, CIPRO, PROTONIX taken at the same time caused patient to lose her breath and have trouble breathing, requiring hospital stay at [facility], 03/23/14-per patient.    Lisinopril Cough      Family History  Problem Relation Age of Onset   Heart failure Mother    Cirrhosis Father        ETOH   Heart disease Brother    Diabetes Daughter    Breast cancer Maternal Aunt    Colon cancer Neg Hx      Social History Ms. Mcquire reports that she quit smoking about 45 years ago. Her smoking use included cigarettes. She started smoking about 57 years ago. She has a 3 pack-year smoking history. She has never used smokeless tobacco. Ms. Probasco reports no history of alcohol use.   Review of Systems CONSTITUTIONAL: No weight loss, fever, chills, weakness or fatigue.  HEENT: Eyes: No visual loss,  blurred vision, double vision or yellow sclerae.No hearing loss, sneezing, congestion, runny nose or sore throat.  SKIN: No rash or itching.  CARDIOVASCULAR:  RESPIRATORY: No shortness of breath, cough or sputum.  GASTROINTESTINAL: No anorexia, nausea, vomiting or diarrhea. No abdominal pain or blood.  GENITOURINARY: No burning on urination, no polyuria NEUROLOGICAL: No headache, dizziness, syncope, paralysis, ataxia, numbness or tingling in the extremities. No change in bowel or bladder control.  MUSCULOSKELETAL: No muscle, back pain, joint pain or stiffness.  LYMPHATICS: No enlarged nodes. No history of splenectomy.  PSYCHIATRIC: No history of depression or anxiety.  ENDOCRINOLOGIC: No reports of sweating, cold or heat intolerance. No polyuria or polydipsia.  Marland Kitchen   Physical Examination There were no vitals filed for this visit. There were no vitals filed for this visit.  Gen: resting comfortably, no acute distress HEENT: no scleral icterus, pupils equal round and reactive, no palptable cervical adenopathy,  CV Resp: Clear to auscultation bilaterally GI: abdomen is soft, non-tender, non-distended, normal bowel sounds, no hepatosplenomegaly MSK: extremities are warm, no edema.  Skin: warm, no rash Neuro:  no focal deficits Psych: appropriate affect   Diagnostic Studies  03/2014 echo Study Conclusions  - Left ventricle: The cavity size was normal. There was mild focal basal hypertrophy of the septum. Systolic function was severely reduced. The estimated ejection fraction was in the range of 25% to 30%. There is akinesis of the entireanteroseptal myocardium. Doppler parameters are consistent with a reversible restrictive pattern, indicative of decreased left ventricular diastolic compliance and/or increased left atrial pressure (grade 3 diastolic dysfunction). - Mitral valve: There was mild regurgitation. - Left atrium: The atrium was moderately dilated.  03/2014  Cath Procedural Findings:   Hemodynamics   RA 8/0 mean 4 mm Hg   RV 42/4 mm Hg   PA 44/14 mean 26 mm Hg   PCWP 20/28 mean 21 mm Hg   LV 98/18 mm Hg   AO 95/54 mean 72  mm Hg   Oxygen saturations:   PA 62%   AO 90%   Cardiac Output (Fick) 6.9 L/min   Cardiac Index (Fick) 3.4 L/min/meter squared   Coronary angiography:   Coronary dominance: right   Left mainstem: Normal   Left anterior descending (LAD): Diffuse disease in the proximal vessel up to 20%. 30-40% disease in the mid vessel prior to the second diagonal.   Left circumflex (LCx): 40% in OM1. 20-30% in OM2. Otherwise no significant disease.   Right coronary artery (RCA): 99% subtotal occlusion at the crux. TIMI 1 flow. Left to right collaterals to the distal vessel.   Left ventriculography: Left ventricular systolic function is abnormal, LVEF is estimated at 25%. There is inferior akinesis and severe global hypokinesis. There is mild mitral regurgitation   Final Conclusions:   1. Single vessel obstructive CAD with subtotal occlusion of the RCA at the crux.   2. Severe LV dysfunction.   3. Normal Right heart pressures. Elevated PCWP.   Recommendations: Medical management. Her degree of LV dysfunction is out of proportion to her CAD. She has no anginal symptoms so PCI of RCA not indicated. The inferior wall appears scarred.   07/2015 echo Study Conclusions   - Left ventricle: The cavity size was at the upper limits of   normal. Wall thickness was increased in a pattern of moderate   LVH. The estimated ejection fraction was 40% (similar calculation   per biplane speckle tracking, reduced global longitudinal strain   of -14.3%). There is akinesis of the basalinferolateral and   inferior myocardium. Doppler parameters are consistent with   abnormal left ventricular relaxation (grade 1 diastolic   dysfunction). - Aortic valve: Mildly calcified annulus. Trileaflet. - Mitral valve: Calcified annulus. There was trivial  regurgitation. - Left atrium: The atrium was at the upper limits of normal in   size. - Right atrium: Central venous pressure (est): 3 mm Hg. - Atrial septum: A patent foramen ovale cannot be excluded. - Tricuspid valve: There was trivial regurgitation. - Pulmonary arteries: Systolic pressure could not be accurately   estimated. - Pericardium, extracardiac: There was no pericardial effusion.   Impressions:   - Upper normal LV chamber size with moderate LVH and LVEF   approximately 40% as discussed above. There is akinesis of the   basal inferoseptal and inferior myocardium. Grade 1 diastolic   dysfunction. Compared to the previous study from October 2015,   LVEF is in similar range, perhaps slightly reduced. Upper normal   left atrial chamber size. Trivial tricuspid regurgitation. Cannot   exclude PFO based on limited images.     01/2018 echo Study Conclusions   - Left ventricle: The cavity size was normal. Wall thickness was   increased in a pattern of mild LVH. The estimated ejection   fraction was 35%. There is akinesis of the basal-midinferior   myocardium. There is akinesis of the mid-apicalanteroseptal and   apical myocardium. Doppler parameters are consistent with   abnormal left ventricular relaxation (grade 1 diastolic   dysfunction). - Ventricular septum: Septal motion showed abnormal function and   dyssynergy suggestive of left bundle [facility] St[facility] block. - Aortic valve: Moderately calcified annulus. Trileaflet. - Mitral valve: There was mild regurgitation. - Right atrium: Central venous pressure (est): 3 mm Hg. - Atrial septum: No defect or patent foramen ovale was identified. - Tricuspid valve: There was trivial regurgitation. - Pulmonary arteries: Systolic pressure could not be accurately   estimated. - Pericardium, extracardiac: There was no  pericardial effusion     11/2018 echo 1. The left ventricle has moderate-severely reduced systolic function, with an ejection  fraction of 30-35%. The cavity size was normal. There is mildly increased left ventricular wall thickness. Left ventricular diastolic Doppler parameters are  consistent with impaired relaxation. Elevated mean left atrial pressure.  2. The anteroseptal, anterolateral, apical walls are hypokinetic.  3. The right ventricle has normal systolic function. The cavity was normal. There is no increase in right ventricular wall thickness.  4. Left atrial size was moderately dilated.  5. No evidence of mitral valve stenosis.  6. The aortic valve is tricuspid no stenosis of the aortic valve.  7. Pulmonary hypertension is indeterminant, inadequate TR jet.  8. The inferior vena cava was dilated in size with <50% respiratory variability.  9. The interatrial septum was not well visualized.   05/2021 ABI FINDINGS: Right ABI:  1.22   Left ABI:  1.14   Right Lower Extremity:  Monophasic waveforms are seen.   Left Lower Extremity:  Monophasic waveforms are seen.   IMPRESSION: Although ABI values are within normal limits, bilateral monophasic waveforms are still suspicious for significant underlying arterial occlusive disease. Further evaluation with CT angiography of the abdominal aorta and bilateral lower extremity should be considered.     Assessment and Plan   1. Chronic combined systolic/diastolic HF - medical therapy limited by soft bp's. Did not tolerate entresto. She is on her maximally tolerated regimen. More recent issues with low bp's, coreg and lasix were lowered, bps have improved - s/p BIV AICD, barostim - no significant symptoms, continue current meds   2. CAD - medically managed single vessel RCA disease - no recent symptoms, continue current meds   3. Hyperlipidemia -upcoming labs with pcp - continue current meds       Antoine Poche, M.D., F.A.C.C.

## 2023-12-07 NOTE — Telephone Encounter (Signed)
 Patient was identified as falling into the [place] Measure - Diabetes.   Patient was: Appointment scheduled for lab or office visit for A1c.   A1C completed today with [facility].

## 2023-12-08 DIAGNOSIS — Z79899 Other long term (current) drug therapy: Secondary | ICD-10-CM | POA: Diagnosis not present

## 2023-12-08 DIAGNOSIS — Z9981 Dependence on supplemental oxygen: Secondary | ICD-10-CM | POA: Diagnosis not present

## 2023-12-08 DIAGNOSIS — Z794 Long term (current) use of insulin: Secondary | ICD-10-CM | POA: Diagnosis not present

## 2023-12-08 DIAGNOSIS — N1832 Chronic kidney disease, stage 3b: Secondary | ICD-10-CM | POA: Diagnosis not present

## 2023-12-08 DIAGNOSIS — E1122 Type 2 diabetes mellitus with diabetic chronic kidney disease: Secondary | ICD-10-CM | POA: Diagnosis not present

## 2023-12-08 DIAGNOSIS — E785 Hyperlipidemia, unspecified: Secondary | ICD-10-CM | POA: Diagnosis not present

## 2023-12-08 DIAGNOSIS — I509 Heart failure, unspecified: Secondary | ICD-10-CM | POA: Diagnosis not present

## 2023-12-08 DIAGNOSIS — J9600 Acute respiratory failure, unspecified whether with hypoxia or hypercapnia: Secondary | ICD-10-CM | POA: Diagnosis not present

## 2023-12-08 DIAGNOSIS — I130 Hypertensive heart and chronic kidney disease with heart failure and stage 1 through stage 4 chronic kidney disease, or unspecified chronic kidney disease: Secondary | ICD-10-CM | POA: Diagnosis not present

## 2023-12-08 DIAGNOSIS — I5043 Acute on chronic combined systolic (congestive) and diastolic (congestive) heart failure: Secondary | ICD-10-CM | POA: Diagnosis not present

## 2023-12-08 DIAGNOSIS — I2510 Atherosclerotic heart disease of native coronary artery without angina pectoris: Secondary | ICD-10-CM | POA: Diagnosis not present

## 2023-12-08 DIAGNOSIS — N1831 Chronic kidney disease, stage 3a: Secondary | ICD-10-CM | POA: Diagnosis not present

## 2023-12-09 ENCOUNTER — Telehealth: Payer: Self-pay | Admitting: Internal Medicine

## 2023-12-09 DIAGNOSIS — I130 Hypertensive heart and chronic kidney disease with heart failure and stage 1 through stage 4 chronic kidney disease, or unspecified chronic kidney disease: Secondary | ICD-10-CM | POA: Diagnosis not present

## 2023-12-09 DIAGNOSIS — Z794 Long term (current) use of insulin: Secondary | ICD-10-CM | POA: Diagnosis not present

## 2023-12-09 DIAGNOSIS — E039 Hypothyroidism, unspecified: Secondary | ICD-10-CM | POA: Diagnosis not present

## 2023-12-09 DIAGNOSIS — N1831 Chronic kidney disease, stage 3a: Secondary | ICD-10-CM | POA: Diagnosis not present

## 2023-12-09 DIAGNOSIS — Z79899 Other long term (current) drug therapy: Secondary | ICD-10-CM | POA: Diagnosis not present

## 2023-12-09 DIAGNOSIS — I2510 Atherosclerotic heart disease of native coronary artery without angina pectoris: Secondary | ICD-10-CM | POA: Diagnosis not present

## 2023-12-09 DIAGNOSIS — N1832 Chronic kidney disease, stage 3b: Secondary | ICD-10-CM | POA: Diagnosis not present

## 2023-12-09 DIAGNOSIS — I509 Heart failure, unspecified: Secondary | ICD-10-CM | POA: Diagnosis not present

## 2023-12-09 DIAGNOSIS — E1122 Type 2 diabetes mellitus with diabetic chronic kidney disease: Secondary | ICD-10-CM | POA: Diagnosis not present

## 2023-12-09 DIAGNOSIS — I5043 Acute on chronic combined systolic (congestive) and diastolic (congestive) heart failure: Secondary | ICD-10-CM | POA: Diagnosis not present

## 2023-12-09 DIAGNOSIS — Z9981 Dependence on supplemental oxygen: Secondary | ICD-10-CM | POA: Diagnosis not present

## 2023-12-09 DIAGNOSIS — Z95810 Presence of automatic (implantable) cardiac defibrillator: Secondary | ICD-10-CM | POA: Diagnosis not present

## 2023-12-09 NOTE — Telephone Encounter (Signed)
 Home Health Verbal Orders - Caller/Agency: [name] with [facility]  Callback Number: [phone]  Service Requested: Occupational Therapy eval to be moved to next week

## 2023-12-09 NOTE — Telephone Encounter (Signed)
 Spoke with [name] approved verbal orders

## 2023-12-10 DIAGNOSIS — E1122 Type 2 diabetes mellitus with diabetic chronic kidney disease: Secondary | ICD-10-CM | POA: Diagnosis not present

## 2023-12-10 DIAGNOSIS — E039 Hypothyroidism, unspecified: Secondary | ICD-10-CM | POA: Diagnosis not present

## 2023-12-10 DIAGNOSIS — I2510 Atherosclerotic heart disease of native coronary artery without angina pectoris: Secondary | ICD-10-CM | POA: Diagnosis not present

## 2023-12-10 DIAGNOSIS — N1832 Chronic kidney disease, stage 3b: Secondary | ICD-10-CM | POA: Diagnosis not present

## 2023-12-10 DIAGNOSIS — Z9981 Dependence on supplemental oxygen: Secondary | ICD-10-CM | POA: Diagnosis not present

## 2023-12-10 DIAGNOSIS — M8580 Other specified disorders of bone density and structure, unspecified site: Secondary | ICD-10-CM | POA: Diagnosis not present

## 2023-12-10 DIAGNOSIS — Z9989 Dependence on other enabling machines and devices: Secondary | ICD-10-CM | POA: Diagnosis not present

## 2023-12-10 DIAGNOSIS — I129 Hypertensive chronic kidney disease with stage 1 through stage 4 chronic kidney disease, or unspecified chronic kidney disease: Secondary | ICD-10-CM | POA: Diagnosis not present

## 2023-12-11 ENCOUNTER — Other Ambulatory Visit: Payer: Self-pay | Admitting: Internal Medicine

## 2023-12-11 DIAGNOSIS — E1122 Type 2 diabetes mellitus with diabetic chronic kidney disease: Secondary | ICD-10-CM | POA: Diagnosis not present

## 2023-12-11 DIAGNOSIS — Z79899 Other long term (current) drug therapy: Secondary | ICD-10-CM | POA: Diagnosis not present

## 2023-12-11 DIAGNOSIS — I130 Hypertensive heart and chronic kidney disease with heart failure and stage 1 through stage 4 chronic kidney disease, or unspecified chronic kidney disease: Secondary | ICD-10-CM | POA: Diagnosis not present

## 2023-12-11 DIAGNOSIS — N1832 Chronic kidney disease, stage 3b: Secondary | ICD-10-CM | POA: Diagnosis not present

## 2023-12-11 DIAGNOSIS — E039 Hypothyroidism, unspecified: Secondary | ICD-10-CM

## 2023-12-11 DIAGNOSIS — Z794 Long term (current) use of insulin: Secondary | ICD-10-CM | POA: Diagnosis not present

## 2023-12-11 DIAGNOSIS — I5041 Acute combined systolic (congestive) and diastolic (congestive) heart failure: Secondary | ICD-10-CM | POA: Diagnosis not present

## 2023-12-11 DIAGNOSIS — I2510 Atherosclerotic heart disease of native coronary artery without angina pectoris: Secondary | ICD-10-CM | POA: Diagnosis not present

## 2023-12-11 DIAGNOSIS — Z8639 Personal history of other endocrine, nutritional and metabolic disease: Secondary | ICD-10-CM | POA: Diagnosis not present

## 2023-12-12 ENCOUNTER — Ambulatory Visit: Attending: Cardiovascular Disease

## 2023-12-12 ENCOUNTER — Telehealth: Payer: Self-pay

## 2023-12-12 DIAGNOSIS — Z95810 Presence of automatic (implantable) cardiac defibrillator: Secondary | ICD-10-CM

## 2023-12-12 DIAGNOSIS — R0689 Other abnormalities of breathing: Secondary | ICD-10-CM | POA: Diagnosis not present

## 2023-12-12 DIAGNOSIS — I5022 Chronic systolic (congestive) heart failure: Secondary | ICD-10-CM

## 2023-12-12 DIAGNOSIS — I5043 Acute on chronic combined systolic (congestive) and diastolic (congestive) heart failure: Secondary | ICD-10-CM | POA: Diagnosis not present

## 2023-12-12 DIAGNOSIS — J441 Chronic obstructive pulmonary disease with (acute) exacerbation: Secondary | ICD-10-CM | POA: Diagnosis not present

## 2023-12-12 DIAGNOSIS — J9601 Acute respiratory failure with hypoxia: Secondary | ICD-10-CM | POA: Diagnosis not present

## 2023-12-12 NOTE — Transitions of Care (Post Inpatient/ED Visit) (Signed)
 12/12/2023  Name: [name] MRN: [id number] DOB: 1951/02/27  Today's TOC FU Call Status: Today's TOC FU Call Status:: Successful TOC FU Call Completed TOC FU Call Complete Date: 12/12/23 Patient's Name and Date of Birth confirmed.  Transition Care Management Follow-up Telephone Call Date of Discharge: 12/11/23 Discharge Facility: Other (Non[facility] Facility) Name of Other ([facility]) Discharge Facility: [facility] Type of Discharge: Inpatient Admission Primary Inpatient Discharge Diagnosis:: Respiratory insufficiency   COPD with acute exacerbation How have you been since you were released from the hospital?: Better (Reports she is doing better. Breathing is good) Any questions or concerns?: No  Items Reviewed: Did you receive and understand the discharge instructions provided?: Yes Medications obtained,verified, and reconciled?: Yes (Medications Reviewed) Any new allergies since your discharge?: No Dietary orders reviewed?: Yes Type of Diet Ordered:: low salt diet Do you have support at home?: Yes People in Home: child(ren), adult Name of Support/Comfort Primary Source: CNA for 5 days  Medications Reviewed Today: Medications Reviewed Today   Medications were not reviewed in this encounter     Home Care and Equipment/Supplies: Were Home Health Services Ordered?: Yes Name of Home Health Agency:: [facility] (from previous admission) Has Agency set up a time to come to your home?: Yes First Home Health Visit Date: 12/09/23 Any new equipment or medical supplies ordered?: No  Functional Questionnaire: Do you need assistance with bathing/showering or dressing?: Yes (Care giver assist) Do you need assistance with meal preparation?: Yes (caregiver assisting with) Do you need assistance with eating?: No Do you have difficulty maintaining continence: No Do you need assistance with getting out of bed/getting out of a chair/moving?: No Do you have difficulty managing or taking  your medications?: No  Follow up appointments reviewed: PCP Follow-up appointment confirmed?: No (patient reports she will call and schedule) MD Provider Line Number:[phone] Given: No Specialist Hospital Follow-up appointment confirmed?: No Reason Specialist Follow-Up Not Confirmed: Patient has Specialist Provider Number and will Call for Appointment Do you need transportation to your follow-up appointment?: No Do you understand care options if your condition(s) worsen?: Yes-patient verbalized understanding  SDOH Interventions Today    Flowsheet Row Most Recent Value  SDOH Interventions   Food Insecurity Interventions Intervention Not Indicated  Housing Interventions Intervention Not Indicated  Transportation Interventions Intervention Not Indicated  Utilities Interventions Intervention Not Indicated      TOC Interventions Today    Flowsheet Row Most Recent Value  TOC Interventions   TOC Interventions Discussed/Reviewed TOC Interventions Discussed, TOC Interventions Reviewed, S/S of infection      Interventions Today    Flowsheet Row Most Recent Value  Chronic Disease   Chronic disease during today's visit Congestive Heart Failure (CHF)  General Interventions   General Interventions Discussed/Reviewed General Interventions Discussed, General Interventions Reviewed, Doctor Visits  Doctor Visits Discussed/Reviewed PCP, Specialist  PCP/Specialist Visits Compliance with follow-up visit  Exercise Interventions   Exercise Discussed/Reviewed Exercise Reviewed  [patient active with home health PT]  Education Interventions   Provided Verbal Education On Nutrition, Blood Sugar Monitoring, When to see the doctor, Insurance Plans  Nutrition Interventions   Nutrition Discussed/Reviewed Decreasing salt, Carbohydrate meal planning, Nutrition Discussed, Nutrition Reviewed  Pharmacy Interventions   Pharmacy Dicussed/Reviewed Medications and their functions       Spoke with  patient via phone. She reports that she is self managing well with her family and Caregiver. Reports she did not weigh today.  Reports her scale is without working batteries.  Reviewed with patient how to  weigh. Encouraged patient to weigh daily . Reviewed putting the scale on a flat surface and emptying bladder.  Patient reports that her son will go get a battery today. Encouraged patient to call PCP and cardiology to schedule hospital follow up.  Reviewed again the importance of low salt diet to avoid fluid overload, Patient voiced understanding.  Reviewed and offered 30 day TOC program and patient reports she is self managing well with her caregiver.  Provided my contact information if patient needs to call me back.    Lonia Chimera, RN, BSN, CEN [employer]- Transition of Care Team.  [facility] [phone]

## 2023-12-13 ENCOUNTER — Telehealth: Payer: Self-pay | Admitting: Cardiology

## 2023-12-13 ENCOUNTER — Encounter: Payer: Self-pay | Admitting: Internal Medicine

## 2023-12-13 ENCOUNTER — Telehealth: Admitting: Internal Medicine

## 2023-12-13 VITALS — Ht 65.0 in | Wt 195.0 lb

## 2023-12-13 DIAGNOSIS — R0600 Dyspnea, unspecified: Secondary | ICD-10-CM | POA: Diagnosis not present

## 2023-12-13 DIAGNOSIS — Z09 Encounter for follow-up examination after completed treatment for conditions other than malignant neoplasm: Secondary | ICD-10-CM | POA: Insufficient documentation

## 2023-12-13 DIAGNOSIS — E1165 Type 2 diabetes mellitus with hyperglycemia: Secondary | ICD-10-CM | POA: Diagnosis not present

## 2023-12-13 DIAGNOSIS — G4733 Obstructive sleep apnea (adult) (pediatric): Secondary | ICD-10-CM

## 2023-12-13 DIAGNOSIS — Z794 Long term (current) use of insulin: Secondary | ICD-10-CM

## 2023-12-13 DIAGNOSIS — I5043 Acute on chronic combined systolic (congestive) and diastolic (congestive) heart failure: Secondary | ICD-10-CM

## 2023-12-13 MED ORDER — ALBUTEROL SULFATE (2.5 MG/3ML) 0.083% IN NEBU: 2.5000 mg | INHALATION_SOLUTION | Freq: Four times a day (QID) | RESPIRATORY_TRACT | 1 refills | Status: DC | PRN

## 2023-12-13 NOTE — Assessment & Plan Note (Signed)
 Recent hospitalization for acute CHF exacerbation, could be related to recent influenza infection in 02/25 On ARB, Spironolactone, Jardiance and Lasix Takes Lasix 20 mg every other day due to hypotension from daily dosing Has leg swelling, but slowly improving

## 2023-12-13 NOTE — Patient Instructions (Addendum)
 Please use Albuterol nebulizer as needed for shortness of breath or wheezing.  Please continue taking Lasix 20 mg every other day for now.  Please continue taking Lantus 20 U only at bedtime. Please check blood glucose regularly and contact if blood glucose runs higher than 200 on 3 consecutive days in the morning.  Please contact Dr Reginia Naas office for follow up of OSA.  [phone].  [facility] [address] [place], [place] [address]

## 2023-12-13 NOTE — Telephone Encounter (Signed)
 Pt would like a c/b from nurse in regards to medication changes after being released from the hospital. Please advise

## 2023-12-13 NOTE — Telephone Encounter (Signed)
 Spoke with patient - stated that she would like to discuss her discharge medications.  Saw her pcp recently & was told by him to check with cardiologist before changing anything.  Patient was scheduled for her 6 month follow up on 12/07/2023 but was hospitalized at that time.  Advised her to continue things per her discharge instructions for now & we will schedule her for soon follow up.  Scheduled her for 01/23/2024 with Herma Carson, PA in [place] office.  I have placed her on the cancellation list as well.

## 2023-12-13 NOTE — Progress Notes (Signed)
 EPIC Encounter for ICM Monitoring  Patient Name: [name] is a 73 y.o. female Date: 12/13/2023 Primary Care Physican: Anabel Halon, MD Primary Cardiologist: Branch Electrophysiologist: Mealor Bi-V Pacing:  >99%        06/14/2023 Weight: 190 lbs 06/29/2023 Weight: 189-190 07/20/2023 Weight: 189-190 lbs 07/27/2023 Weight: 189 lbs 09/27/2023 Weight: 189 lbs 11/02/2023 Weight: 189 lbs 12/07/2023 Weight: 197 lbs (admitted to hospital) 12/13/2023 Weight: 195 lbs                                                         Spoke with patient and heart failure questions reviewed.  Transmission results reviewed.  Pt reports she hospital discharge 3/23 for CHF (2nd admission this month) and weight remains high (11 pounds difference since 2/12).  No tightness or swelling in legs.  She is having trouble urinating after having a catheter this hospitalization and don't feel it is back to normal yet.  Hospitalization 3/11 -3/15 and 3/19-3/23 for Acute on Chronic Congestive Heart Failure.   Corvue Thoracic impedance suggesting fluid levels returned to normal post hospital discharge from [facility].  Hospitalization 3/11-3/15 and 3/19-3/23.   Prescribed:  Furosemide 20 mg take 1 tablet by mouth daily every other day per 3/25 Dr Eliane Decree note.  He instructed her to take Lasix daily if she gains 2 lbs a day. Spironolactone 25 mg take 1 tablet daily.   Labs: 12/03/2023 Creatinine 1.33, BUN 27, Potassium 4.2, Sodium 135, GFR 42  12/02/2023 Creatinine 1.28, BUN 27, Potassium 4.5, Sodium 137, GFR 44  12/01/2023 Creatinine 1.23, BUN 31, Potassium 4.2, Sodium 135, GFR 46  11/29/2023 Creatinine 1.31, BUN 34, Potassium 5.2, Sodium 135, GFR 43 A complete set of results can be found in Results Review.   Recommendations:  First available appt for cardiology is May (she is on wait list in case there are cancellations).  Advised if condition changes, to call the office to check if other [facility] offices have any  earlier openings or use ER.  Advised to limit salt and fluid intake and call if she experiences any fluid symptoms.     Follow-up plan: ICM clinic phone appointment on 12/19/2023 to recheck fluid levels.    91 day device clinic remote transmission 02/04/2024.        EP/Cardiology Office Visits:   01/23/2024 with Jacolyn Reedy, PA.  Recall 10/29/2024 with Dr Nelly Laurence.   Copy of ICM check sent to Dr. Nelly Laurence and Dr Wyline Mood as Lorain Childes.  3 month ICM trend: 12/12/2023.    12-14 Month ICM trend:     Karie Soda, RN 12/13/2023 4:16 PM

## 2023-12-13 NOTE — Assessment & Plan Note (Addendum)
 Lab Results  Component Value Date   HGBA1C 6.8 12/07/2023   Well-controlled - improved now from >15.5 in 09/24 On Lantus 20 U QD, Ozempic 0.5 mg qw, Jardiance 10 mg QD and Glipizide 5 mg BID Advised to continue Lantus 20 U once daily for now due to improvement in glycemic profile  Had referred to endocrinology, but did not follow up Advised to follow diabetic diet On statin F/u CMP and lipid panel Diabetic eye exam: Advised to follow up with Ophthalmology for diabetic eye exam  Avoid skipping any meal. Advised to keep snacks or orange juice accessible most of the time. Gvoke hypopen for hypoglycemia.

## 2023-12-13 NOTE — Progress Notes (Signed)
 Virtual Visit via Video Note   Because of [name]'s co-morbid illnesses, she is at least at moderate risk for complications without adequate follow up.  This format is felt to be most appropriate for this patient at this time.  All issues noted in this document were discussed and addressed.  A limited physical exam was performed with this format.      Evaluation Performed:  Follow-up visit  Date:  12/13/2023   ID:  [name], DOB Aug 22, 1951, MRN [id number]  Patient Location: Home Provider Location: Office/Clinic  Participants: Patient Location of Patient: Home Location of Provider: Telehealth Consent was obtain for visit to be over via telehealth. I verified that I am speaking with the correct person using two identifiers.  PCP:  Anabel Halon, MD   Chief Complaint: Hospital discharge follow-up  History of Present Illness:    KALIOPE Best is a 73 y.o. female with PMH of chronic combined systolic and diastolic CHF, ischemic cardiomyopathy status post ICD placement, hypertension, hyperlipidemia, type 2 diabetes mellitus, obstructive sleep apnea on BiPAP at night, obesity, coronary artery disease, GERD and hypothyroidism who has a video visit for follow-up follow-up after recent hospitalization from 11/29/23-12/03/23 and 12/07/23-12/11/23.  She was admitted to [facility] both times for acute on chronic CHF exacerbation.  She was given IV Lasix during the hospital stay, had cardiology evaluation and was discharged with continuation of her Lasix 20 mg every other day, losartan 25 mg QD, spironolactone 25 mg QD and Coreg 12.5 mg twice daily.  She has noticed improvement in her leg swelling and dyspnea since returning home.  She still feels fatigued, but is regaining her strength back slowly.  She was told to get home sleep study for OSA.  Of note, she had been referred to Dr. Vassie Loll for OSA, but has not been able to set up follow up appt yet.  She was advised  to take Lantus 20 units only at nighttime due to tightly controlled glycemic profile during hospital stay.  Her HbA1c was 6.8, which has significantly improved from > 15.5 in 09/24.  The patient does not have symptoms concerning for COVID-19 infection (fever, chills, cough, or new shortness of breath).   Past Medical, Surgical, Social History, Allergies, and Medications have been Reviewed.  Past Medical History:  Diagnosis Date   Anemia    Arthritis    CAD (coronary artery disease)    a. LHC (03/2014) Lmain: nl, LAD: diff dz proximal 20%, 30-40% dz mid vessel prior 2nd diagonal, LCx: 40% in OM1, 20-30% in OM2, RCA: 99% subtotal occlusion @ crux, TIMI 1 flow, L-R collaterals to distal vessel   Cardiomyopathy (HCC) 03/28/2014   LV dysfunction out of proportion to CAD 03/28/14   CHF (congestive heart failure) (HCC)    Phreesia 10/03/2020   Chronic systolic heart failure (HCC)    a. ECHO (03/2014): EF 25-30%, akinesis enteroanteroseptal myocardium, grade III DD b. RHC (03/2014) RA 4, RV 42/4, PA 44/14 (26), PCWP 21, PA 62% Fick CO/CI 6.9 / 3.4   Diabetes mellitus    Diabetes mellitus without complication (HCC)    Phreesia 10/03/2020   Duodenal ulcer    Age 64   Dyspnea    Erosive esophagitis    Gastritis    Hypertension    Hypothyroidism    Obese    Short-term memory loss    Thyroid disease    TTP (thrombotic thrombocytopenic purpura) (HCC) 06/2005   Past Surgical  History:  Procedure Laterality Date   BIV ICD INSERTION CRT-D N/A 05/03/2019   Procedure: BIV ICD INSERTION CRT-D;  Surgeon: Hillis Range, MD;  Location: [facility] INVASIVE CV LAB;  Service: Cardiovascular;  Laterality: N/A;   CARDIAC CATHETERIZATION     COLONOSCOPY  08/29/2012   [id number] diverticulosis   COLONOSCOPY N/A 05/28/2015   Procedure: COLONOSCOPY;  Surgeon: Corbin Ade, MD;  Location: [facility] ENDO [facility]ITE;  Service: Endoscopy;  Laterality: N/A;  1015   ECTOPIC PREGNANCY SURGERY     ESOPHAGOGASTRODUODENOSCOPY  04/20/2012    [id number] antral gastritis/Erosive reflux esophagitis   LEFT AND RIGHT HEART CATHETERIZATION WITH CORONARY ANGIOGRAM N/A 03/28/2014   Procedure: LEFT AND RIGHT HEART CATHETERIZATION WITH CORONARY ANGIOGRAM;  Surgeon: Peter M Swaziland, MD;  Location: [facility] CATH LAB;  Service: Cardiovascular;  Laterality: N/A;   SPLENECTOMY  07/20/2005     Current Meds  Medication Sig   acetaminophen (TYLENOL) 500 MG tablet Take 1,000 mg by mouth every 6 (six) hours as needed for moderate pain.   albuterol (PROVENTIL) (2.5 MG/3ML) 0.083% nebulizer solution Take 3 mLs (2.5 mg total) by nebulization every 6 (six) hours as needed for wheezing or shortness of breath.   aspirin 81 MG chewable tablet Chew 1 tablet (81 mg total) by mouth daily.   atorvastatin (LIPITOR) 80 MG tablet TAKE 1 TABLET EVERY DAY   blood glucose meter kit and supplies Dispense based on patient and insurance preference. Use up to four times daily as directed.(FOR E11.40).   carvedilol (COREG) 12.5 MG tablet TAKE 1 TABLET TWICE DAILY (DOSE DECREASE)   Ciclopirox 0.77 % gel APPLY ONE TIME DAILY ON THE AFFECTED TOE NAIL   empagliflozin (JARDIANCE) 10 MG TABS tablet Take 1 tablet (10 mg total) by mouth daily before breakfast.   furosemide (LASIX) 20 MG tablet TAKE 1 TABLET EVERY DAY (Patient taking differently: Take 20 mg by mouth daily. Takes every other day)   glipiZIDE (GLUCOTROL) 5 MG tablet Take 1 tablet (5 mg total) by mouth 2 (two) times daily before a meal.   Glucagon (GVOKE HYPOPEN 2-PACK) 1 MG/0.2ML SOAJ Inject 1 mg into the skin as needed (Blood glucose < 53).   insulin glargine (LANTUS SOLOSTAR) 100 UNIT/ML Solostar Pen Inject 24 Units into the skin 2 (two) times daily. (Patient taking differently: Inject 20 Units into the skin at bedtime.)   Insulin Pen Needle 31G X 5 MM MISC Use as directed for Lantus and Novolog.   levothyroxine (SYNTHROID) 50 MCG tablet TAKE 1 TABLET EVERY DAY BEFORE BREAKFAST   losartan (COZAAR) 25 MG tablet TAKE 1  TABLET EVERY DAY   Multiple Vitamin (MULTIVITAMIN) tablet Take 1 tablet by mouth daily.   ondansetron (ZOFRAN) 4 MG tablet Take 1 tablet (4 mg total) by mouth every 6 (six) hours.   polyethylene glycol (MIRALAX / GLYCOLAX) packet Take 17 g by mouth daily as needed for mild constipation.   promethazine-dextromethorphan (PROMETHAZINE-DM) 6.25-15 MG/5ML syrup Take 5 mLs by mouth 4 (four) times daily as needed.   Semaglutide, 1 MG/DOSE, 4 MG/3ML SOPN Inject 1 mg as directed once a week.   spironolactone (ALDACTONE) 25 MG tablet TAKE 1 TABLET EVERY DAY   TRUE METRIX BLOOD GLUCOSE TEST test strip TEST BLOOD SUGAR UP TO FOUR TIMES DAILY AS DIRECTED   TRUEplus Lancets 33G MISC USE AS DIRECTED TO TEST BLOOD SUGAR UP TO FOUR TIMES DAILY   [DISCONTINUED] oseltamivir (TAMIFLU) 75 MG capsule Take 1 capsule (75 mg total) by mouth 2 (two) times daily.  Allergies:   Other and Lisinopril   ROS:   Please see the history of present illness. All other systems reviewed and are negative.   Labs/Other Tests and Data Reviewed:    Recent Labs: 06/13/2023: ALT 19; BUN 15; Creatinine, Ser 0.86; Hemoglobin 11.3; Platelets 150; Potassium 4.7; Sodium 133; TSH 1.740   Recent Lipid Panel Lab Results  Component Value Date/Time   CHOL 120 06/13/2023 01:56 PM   TRIG 145 06/13/2023 01:56 PM   HDL 37 (L) 06/13/2023 01:56 PM   CHOLHDL 3.2 06/13/2023 01:56 PM   CHOLHDL 2.3 03/14/2020 03:24 PM   LDLCALC 58 06/13/2023 01:56 PM   LDLCALC 27 03/14/2020 03:24 PM    Wt Readings from Last 3 Encounters:  12/13/23 195 lb (88.5 kg)  11/17/23 199 lb (90.3 kg)  11/04/23 202 lb (91.6 kg)     Objective:    Vital Signs:  Ht 5\' 5"  (1.651 m)   Wt 195 lb (88.5 kg)   BMI 32.45 kg/m    VITAL SIGNS:  reviewed GEN:  no acute distress EYES:  sclerae anicteric, EOMI - Extraocular Movements Intact RESPIRATORY:  normal respiratory effort, symmetric expansion NEURO:  alert and oriented x 3, no obvious focal deficit PSYCH:   normal affect  ASSESSMENT & PLAN:    Acute on chronic combined systolic and diastolic congestive heart failure (HCC) Recent hospitalization for acute CHF exacerbation, could be related to recent influenza infection in 02/25 On ARB, Spironolactone, Jardiance and Lasix Takes Lasix 20 mg every other day due to hypotension from daily dosing Has leg swelling, but slowly improving  Hospital discharge follow-up Hospital chart reviewed, including discharge summary Medications reconciled and reviewed with the patient in detail Last CBC and CMP reviewed    OSA (obstructive sleep apnea) Needs new BiPAP as old device was recalled Has seen Pulmonology - needs follow up, provided contact information today  Type 2 diabetes mellitus with hyperglycemia, with long-term current use of insulin (HCC) Lab Results  Component Value Date   HGBA1C 6.8 12/07/2023   Well-controlled - improved now from >15.5 in 09/24 On Lantus 20 U QD, Ozempic 0.5 mg qw, Jardiance 10 mg QD and Glipizide 5 mg BID Advised to continue Lantus 20 U once daily for now due to improvement in glycemic profile  Had referred to endocrinology, but did not follow up Advised to follow diabetic diet On statin F/u CMP and lipid panel Diabetic eye exam: Advised to follow up with Ophthalmology for diabetic eye exam  Avoid skipping any meal. Advised to keep snacks or orange juice accessible most of the time. Gvoke hypopen for hypoglycemia.   Dyspnea Her dyspnea is mostly due to CHF She had some improvement in her dyspnea with nebulization during hospital stay Prescribed albuterol neb and nebulizer machine   I discussed the assessment and treatment plan with the patient. The patient was provided an opportunity to ask questions, and all were answered. The patient agreed with the plan and demonstrated an understanding of the instructions.   The patient was advised to call back or seek an in-person evaluation if the symptoms worsen or if  the condition fails to improve as anticipated.  The above assessment and management plan was discussed with the patient. The patient verbalized understanding of and has agreed to the management plan.   Medication Adjustments/Labs and Tests Ordered: Current medicines are reviewed at length with the patient today.  Concerns regarding medicines are outlined above.   Tests Ordered: Orders Placed This Encounter  Procedures  For home use only DME Nebulizer machine    Medication Changes: Meds ordered this encounter  Medications   albuterol (PROVENTIL) (2.5 MG/3ML) 0.083% nebulizer solution    Sig: Take 3 mLs (2.5 mg total) by nebulization every 6 (six) hours as needed for wheezing or shortness of breath.    Dispense:  150 mL    Refill:  1     Note: This dictation was prepared with Dragon dictation along with smaller phrase technology. Similar sounding words can be transcribed inadequately or may not be corrected upon review. Any transcriptional errors that result from this process are unintentional.      Disposition:  Follow up  Signed, Anabel Halon, MD  12/13/2023 11:46 AM     [facility]

## 2023-12-13 NOTE — Assessment & Plan Note (Signed)
 Hospital chart reviewed, including discharge summary Medications reconciled and reviewed with the patient in detail Last CBC and CMP reviewed

## 2023-12-13 NOTE — Assessment & Plan Note (Addendum)
 Her dyspnea is mostly due to CHF She had some improvement in her dyspnea with nebulization during hospital stay Prescribed albuterol neb and nebulizer machine

## 2023-12-13 NOTE — Assessment & Plan Note (Addendum)
 Needs new BiPAP as old device was recalled Has seen Pulmonology - needs follow up, provided contact information today

## 2023-12-15 ENCOUNTER — Inpatient Hospital Stay: Admitting: Internal Medicine

## 2023-12-16 NOTE — Addendum Note (Signed)
 Addended by: Geralyn Flash D on: 12/16/2023 11:59 AM   Modules accepted: Orders

## 2023-12-16 NOTE — Progress Notes (Signed)
 Remote ICD transmission.

## 2023-12-19 ENCOUNTER — Other Ambulatory Visit: Payer: Self-pay | Admitting: Internal Medicine

## 2023-12-19 ENCOUNTER — Ambulatory Visit: Attending: Cardiovascular Disease

## 2023-12-19 DIAGNOSIS — I5022 Chronic systolic (congestive) heart failure: Secondary | ICD-10-CM

## 2023-12-19 DIAGNOSIS — Z95810 Presence of automatic (implantable) cardiac defibrillator: Secondary | ICD-10-CM

## 2023-12-19 DIAGNOSIS — E1140 Type 2 diabetes mellitus with diabetic neuropathy, unspecified: Secondary | ICD-10-CM

## 2023-12-19 NOTE — Progress Notes (Signed)
 EPIC Encounter for ICM Monitoring  Patient Name: [name] is a 73 y.o. female Date: 12/19/2023 Primary Care Physican: Anabel Halon, MD Primary Cardiologist: Branch Electrophysiologist: Mealor Bi-V Pacing:  >99%        06/14/2023 Weight: 190 lbs 06/29/2023 Weight: 189-190 07/20/2023 Weight: 189-190 lbs 07/27/2023 Weight: 189 lbs 09/27/2023 Weight: 189 lbs 11/02/2023 Weight: 189 lbs 12/07/2023 Weight: 197 lbs (admitted to hospital) 12/13/2023 Weight: 195 lbs  12/18/2023 Weight: 191 lbs 12/19/2023 Weigh: 194 lbs                                                        Spoke with patient and heart failure questions reviewed.  Transmission results reviewed.  Pt reports 3 lb weight gain overnight and swelling of her feet.  She took a breathing treatment this morning 5 AM.   Corvue Thoracic impedance suggesting possible fluid accumulation started 3/27.  Hospitalization 3/11-3/15 and 3/19-3/23 for CHF/Resp.   Prescribed:  Furosemide 20 mg take 1 tablet by mouth daily every other day per 3/25 Dr Eliane Decree note.  He instructed her to take Lasix daily if she gains 2 lbs a day. Spironolactone 25 mg take 1 tablet daily.   Labs: 12/11/2023 Creatinine 1.03, BUN 35, Potassium 4.5, Sodium 136, GFR 57 12/10/2023 Creatinine 1.1,   BUN 36, Potassium 4.3, Sodium 138, GFR 53 12/09/2023 Creatinine 1.17, BUN 40, Potassium 4.8, Sodium 138, GFR 49 12/08/2023 Creatinine 1.3,   BUN 36, Potassium 4.6, Sodium 136, GFR 43 12/07/2023 Creatinine 1.19, BUN 26, Potassium 4.9, Sodium 141, GFR 48 12/03/2023 Creatinine 1.33, BUN 27, Potassium 4.2, Sodium 135, GFR 42  12/02/2023 Creatinine 1.28, BUN 27, Potassium 4.5, Sodium 137, GFR 44  12/01/2023 Creatinine 1.23, BUN 31, Potassium 4.2, Sodium 135, GFR 46  11/29/2023 Creatinine 1.31, BUN 34, Potassium 5.2, Sodium 135, GFR 43 A complete set of results can be found in Results Review.   Recommendations:  Pt has been taking Lasix 20 mg daily (for the past week) instead of  every other day as prescribed.    Copy sent to Dr Wyline Mood for review and recommendations.   Earliest post hospital appt with Dr Verna Czech office is 5/5 per patient.   Follow-up plan: ICM clinic phone appointment on 12/26/2023 to recheck fluid levels.    91 day device clinic remote transmission 02/04/2024.        EP/Cardiology Office Visits:   01/23/2024 with Jacolyn Reedy, PA.  Recall 10/29/2024 with Dr Nelly Laurence.   Copy of ICM check sent to Dr. Nelly Laurence.  3 month ICM trend: 12/19/2023.    12-14 Month ICM trend:     Karie Soda, RN 12/19/2023 1:53 PM

## 2023-12-21 ENCOUNTER — Telehealth: Payer: Self-pay

## 2023-12-21 NOTE — Telephone Encounter (Signed)
 Per Dr. Wyline Mood - can put her on tomorrow at 220pm in [place]

## 2023-12-21 NOTE — Progress Notes (Signed)
 Spoke with patient.  She has appt with Dr Wyline Mood tomorrow.  Advised to take her medications and list of BP readings to appointment for Dr Wyline Mood to review.  She agreed and appreciated the help.

## 2023-12-21 NOTE — Telephone Encounter (Signed)
Patient notified and verbalized understanding.  Appointment scheduled as requested.

## 2023-12-21 NOTE — Progress Notes (Signed)
 Follow up call to patient. Advised have not received any recommendations from Dr Wyline Mood.   She is feeling a little better today but feet remain swollen and weight today 192 lbs.  She has CMA that will visit her today and check BP which has been running low.    Phone note sent to [facility] Triage at Dr Princess Perna office for review and follow up.     12/21/2023 CorVue thoracic impedance suggesting possible fluid accumulation is progressively worsening despite taking Lasix 20 mg daily for past 9 days instead of every other day.

## 2023-12-21 NOTE — Telephone Encounter (Signed)
 ICM call to patient regarding fluid symptoms she is experiencing since hospital discharge on 3/23.  She had 2 hospitalizations in March for CHF/Resp exacerbations.    She continues to experience swelling of feet and weight gain (has not returned to baseline yet).    11/02/2023 Weight: 189 lbs 12/07/2023 Weight: 197 lbs (admitted to hospital) 12/13/2023 Weight: 195 lbs  12/18/2023 Weight: 191 lbs 12/19/2023 Weight: 194 lbs 12/21/2023 Weight: 192 lbs  She is taking Lasix 20 mg daily for the past 9 days instead of every other day as prescribed.  Lasix was decreased in the past due to low BP.      Copy sent to [facility] Triage at Dr Verna Czech office for review and follow up.    4/2 Corvue Thoracic impedance suggesting possible fluid accumulation starting 3/27 and progressively worsening.

## 2023-12-22 ENCOUNTER — Encounter: Payer: Self-pay | Admitting: Cardiology

## 2023-12-22 ENCOUNTER — Ambulatory Visit: Attending: Cardiology | Admitting: Cardiology

## 2023-12-22 VITALS — BP 106/64 | HR 92 | Ht 65.0 in | Wt 195.4 lb

## 2023-12-22 DIAGNOSIS — I5043 Acute on chronic combined systolic (congestive) and diastolic (congestive) heart failure: Secondary | ICD-10-CM

## 2023-12-22 DIAGNOSIS — G4730 Sleep apnea, unspecified: Secondary | ICD-10-CM | POA: Diagnosis not present

## 2023-12-22 MED ORDER — CARVEDILOL 6.25 MG PO TABS: 6.2500 mg | ORAL_TABLET | Freq: Two times a day (BID) | ORAL | 3 refills | Status: DC

## 2023-12-22 MED ORDER — FUROSEMIDE 20 MG PO TABS
20.0000 mg | ORAL_TABLET | Freq: Every day | ORAL | 1 refills | Status: DC
Start: 2023-12-22 — End: 2024-05-16

## 2023-12-22 NOTE — Progress Notes (Signed)
 Clinical Summary Ms. Jupiter is a 73 y.o.female seen today for follow up of the following meidcal problems.    1. Chronic combined systolic and diastolic heart failure - echo 09/6107 LVEF 25-30%, restrictive diastolic dysfunction. New diagnosis at that time. Repeat echo 06/2014 LVEF 40-45% - cath 03/2014 showed RCA 99% with left to right collaterals, other arteries patent. Overall LV systolic dysfunction out of proportion to CAD.  Medically managed.  - 07/2015 echo LVEF 40%, grade I diastolic dysfunction   - Entresto causes dizziness and nausea, stopped taking. Back on losartan   -  due to low bp's we lowered losartan to 25mg  daily. Lowered lasix to 20mg  daily, may take 40mg  as needed.    01/2018 echo LVEF 35%, grade I diastoilc dysfunction  - 11/2018 echo LVEF 30-35% - 05/03/19 had BIV AICD placed by Dr Nelly Laurence   11/2019 LVEF 30-35% 07/2021 barostim implant  01/2022 echo: LVEF 40-45%, grade I dd    -admit [facility] 11/29/23 -  - soft bp's during admission, losartan and coreg held on admission. Historically soft bp's have limited medical therapy.  - 11/2023 echo [facility]: LVEF 25-30%, mild to mod MR - 3/31 ICD impedance suggesting fluid accumulation - has been taking lasix 20mg  daily  Reports right after discharge had felt fairly well. However over last few days progressively more SOB/DOE, some orthopena, increased LE edema. Home weights drifted up from 190 lbs to 195 lbs.  - home sbps low 100s, sometimes lower after taking her lasix.    Other medical issues not addressed this viisit     2. CAD - cath 03/2014  with 99% chronic RCA disease, otherwise patent vessels. Managed medically -no chest pains   3. OSA  - on bipap at night. Followed by Dr Vassie Loll, overdue for f/u   4. Hyperlipidemia   02/2020 TC 76 HDL 33 TG 75 LDL 27 - upcoming labs on Monday     5. Chronic LBBB     6. Leg pains - back of thighs and calves with walking, resolves with rest - ABIs were normal but  monophasic waveforms suggesting disease. Followed by vascular - 11/2021 CTA: 1. Atherosclerotic calcified and noncalcified plaque seen throughout the superficial femoral and popliteal arteries without high-grade stenoses. 2. Left posterior tibial artery is occluded at the mid lower leg. Two vessel runoff to the left ankle. Three-vessel runoff to the right ankle.   - seen by vacular Dr Arbie Cookey, f/u just as needed.           Past Medical History:  Diagnosis Date   Anemia    Arthritis    CAD (coronary artery disease)    a. LHC (03/2014) Lmain: nl, LAD: diff dz proximal 20%, 30-40% dz mid vessel prior 2nd diagonal, LCx: 40% in OM1, 20-30% in OM2, RCA: 99% subtotal occlusion @ crux, TIMI 1 flow, L-R collaterals to distal vessel   Cardiomyopathy (HCC) 03/28/2014   LV dysfunction out of proportion to CAD 03/28/14   CHF (congestive heart failure) (HCC)    Phreesia 10/03/2020   Chronic systolic heart failure (HCC)    a. ECHO (03/2014): EF 25-30%, akinesis enteroanteroseptal myocardium, grade III DD b. RHC (03/2014) RA 4, RV 42/4, PA 44/14 (26), PCWP 21, PA 62% Fick CO/CI 6.9 / 3.4   Diabetes mellitus    Diabetes mellitus without complication (HCC)    Phreesia 10/03/2020   Duodenal ulcer    Age 49   Dyspnea    Erosive esophagitis  Gastritis    Hypertension    Hypothyroidism    Obese    Short-term memory loss    Thyroid disease    TTP (thrombotic thrombocytopenic purpura) (HCC) 06/2005     Allergies  Allergen Reactions   Other Other (See Comments)    FLAGYL, CIPRO, PROTONIX taken at the same time caused patient to lose her breath and have trouble breathing, requiring hospital stay at [facility], 03/23/14-per patient.    Lisinopril Cough     Current Outpatient Medications  Medication Sig Dispense Refill   acetaminophen (TYLENOL) 500 MG tablet Take 1,000 mg by mouth every 6 (six) hours as needed for moderate pain.     albuterol (PROVENTIL) (2.5 MG/3ML) 0.083% nebulizer solution Take 3 mLs  (2.5 mg total) by nebulization every 6 (six) hours as needed for wheezing or shortness of breath. 150 mL 1   aspirin 81 MG chewable tablet Chew 1 tablet (81 mg total) by mouth daily.     atorvastatin (LIPITOR) 80 MG tablet TAKE 1 TABLET EVERY DAY 90 tablet 3   blood glucose meter kit and supplies Dispense based on patient and insurance preference. Use up to four times daily as directed.(FOR E11.40). 1 each 0   carvedilol (COREG) 12.5 MG tablet TAKE 1 TABLET TWICE DAILY (DOSE DECREASE) 180 tablet 3   Ciclopirox 0.77 % gel APPLY ONE TIME DAILY ON THE AFFECTED TOE NAIL 30 g 0   empagliflozin (JARDIANCE) 10 MG TABS tablet Take 1 tablet (10 mg total) by mouth daily before breakfast. 14 tablet 0   furosemide (LASIX) 20 MG tablet TAKE 1 TABLET EVERY DAY (Patient taking differently: Take 20 mg by mouth daily. Takes every other day) 90 tablet 1   glipiZIDE (GLUCOTROL) 5 MG tablet Take 1 tablet (5 mg total) by mouth 2 (two) times daily before a meal. 60 tablet 3   Glucagon (GVOKE HYPOPEN 2-PACK) 1 MG/0.2ML SOAJ Inject 1 mg into the skin as needed (Blood glucose < 53). 0.4 mL 5   Insulin Pen Needle 31G X 5 MM MISC Use as directed for Lantus and Novolog. 100 each 5   LANTUS SOLOSTAR 100 UNIT/ML Solostar Pen INJECT 24 UNITS INTO THE SKIN 2 (TWO) TIMES DAILY. 45 mL 3   levothyroxine (SYNTHROID) 50 MCG tablet TAKE 1 TABLET EVERY DAY BEFORE BREAKFAST 90 tablet 3   losartan (COZAAR) 25 MG tablet TAKE 1 TABLET EVERY DAY 90 tablet 3   Multiple Vitamin (MULTIVITAMIN) tablet Take 1 tablet by mouth daily.     ondansetron (ZOFRAN) 4 MG tablet Take 1 tablet (4 mg total) by mouth every 6 (six) hours. 10 tablet 0   polyethylene glycol (MIRALAX / GLYCOLAX) packet Take 17 g by mouth daily as needed for mild constipation.     promethazine-dextromethorphan (PROMETHAZINE-DM) 6.25-15 MG/5ML syrup Take 5 mLs by mouth 4 (four) times daily as needed. 118 mL 0   Semaglutide, 1 MG/DOSE, 4 MG/3ML SOPN Inject 1 mg as directed once a  week. 9 mL 0   spironolactone (ALDACTONE) 25 MG tablet TAKE 1 TABLET EVERY DAY 90 tablet 3   TRUE METRIX BLOOD GLUCOSE TEST test strip TEST BLOOD SUGAR UP TO FOUR TIMES DAILY AS DIRECTED 400 strip 3   TRUEplus Lancets 33G MISC USE AS DIRECTED TO TEST BLOOD SUGAR UP TO FOUR TIMES DAILY 400 each 3   No current facility-administered medications for this visit.     Past Surgical History:  Procedure Laterality Date   BIV ICD INSERTION CRT-D N/A 05/03/2019  Procedure: BIV ICD INSERTION CRT-D;  Surgeon: Hillis Range, MD;  Location: [facility] INVASIVE CV LAB;  Service: Cardiovascular;  Laterality: N/A;   CARDIAC CATHETERIZATION     COLONOSCOPY  08/29/2012   [id number] diverticulosis   COLONOSCOPY N/A 05/28/2015   Procedure: COLONOSCOPY;  Surgeon: Corbin Ade, MD;  Location: [facility] ENDO SUITE;  Service: Endoscopy;  Laterality: N/A;  1015   ECTOPIC PREGNANCY SURGERY     ESOPHAGOGASTRODUODENOSCOPY  04/20/2012   GNF:[id number] antral gastritis/Erosive reflux esophagitis   LEFT AND RIGHT HEART CATHETERIZATION WITH CORONARY ANGIOGRAM N/A 03/28/2014   Procedure: LEFT AND RIGHT HEART CATHETERIZATION WITH CORONARY ANGIOGRAM;  Surgeon: Peter M Swaziland, MD;  Location: [facility] CATH LAB;  Service: Cardiovascular;  Laterality: N/A;   SPLENECTOMY  07/20/2005     Allergies  Allergen Reactions   Other Other (See Comments)    FLAGYL, CIPRO, PROTONIX taken at the same time caused patient to lose her breath and have trouble breathing, requiring hospital stay at [facility], 03/23/14-per patient.    Lisinopril Cough      Family History  Problem Relation Age of Onset   Heart failure Mother    Cirrhosis Father        ETOH   Heart disease Brother    Diabetes Daughter    Breast cancer Maternal Aunt    Colon cancer Neg Hx      Social History Ms. Portner reports that she quit smoking about 45 years ago. Her smoking use included cigarettes. She started smoking about 57 years ago. She has a 3 pack-year smoking history. She has  never used smokeless tobacco. Ms. Sagun reports no history of alcohol use.     Physical Examination Today's Vitals   12/22/23 1422  BP: 106/64  Pulse: 92  SpO2: 96%  Weight: 195 lb 6.4 oz (88.6 kg)  Height: 5\' 5"  (1.651 m)   Body mass index is 32.52 kg/m.  Gen: resting comfortably, no acute distress HEENT: no scleral icterus, pupils equal round and reactive, no palptable cervical adenopathy,  CV: RRR, no m/r,g no jvd Resp: decreased breath sounds bilateral bases GI: abdomen is soft, non-tender, non-distended, normal bowel sounds, no hepatosplenomegaly MSK: 1+ bilateral LE edema.  Skin: warm, no rash Neuro:  no focal deficits Psych: appropriate affect   Diagnostic Studies  03/2014 echo Study Conclusions  - Left ventricle: The cavity size was normal. There was mild focal basal hypertrophy of the septum. Systolic function was severely reduced. The estimated ejection fraction was in the range of 25% to 30%. There is akinesis of the entireanteroseptal myocardium. Doppler parameters are consistent with a reversible restrictive pattern, indicative of decreased left ventricular diastolic compliance and/or increased left atrial pressure (grade 3 diastolic dysfunction). - Mitral valve: There was mild regurgitation. - Left atrium: The atrium was moderately dilated.  03/2014 Cath Procedural Findings:   Hemodynamics   RA 8/0 mean 4 mm Hg   RV 42/4 mm Hg   PA 44/14 mean 26 mm Hg   PCWP 20/28 mean 21 mm Hg   LV 98/18 mm Hg   AO 95/54 mean 72 mm Hg   Oxygen saturations:   PA 62%   AO 90%   Cardiac Output (Fick) 6.9 L/min   Cardiac Index (Fick) 3.4 L/min/meter squared   Coronary angiography:   Coronary dominance: right   Left mainstem: Normal   Left anterior descending (LAD): Diffuse disease in the proximal vessel up to 20%. 30-40% disease in the mid vessel prior to the second diagonal.  Left circumflex (LCx): 40% in OM1. 20-30% in OM2. Otherwise no significant disease.    Right coronary artery (RCA): 99% subtotal occlusion at the crux. TIMI 1 flow. Left to right collaterals to the distal vessel.   Left ventriculography: Left ventricular systolic function is abnormal, LVEF is estimated at 25%. There is inferior akinesis and severe global hypokinesis. There is mild mitral regurgitation   Final Conclusions:   1. Single vessel obstructive CAD with subtotal occlusion of the RCA at the crux.   2. Severe LV dysfunction.   3. Normal Right heart pressures. Elevated PCWP.   Recommendations: Medical management. Her degree of LV dysfunction is out of proportion to her CAD. She has no anginal symptoms so PCI of RCA not indicated. The inferior wall appears scarred.   07/2015 echo Study Conclusions   - Left ventricle: The cavity size was at the upper limits of   normal. Wall thickness was increased in a pattern of moderate   LVH. The estimated ejection fraction was 40% (similar calculation   per biplane speckle tracking, reduced global longitudinal strain   of -14.3%). There is akinesis of the basalinferolateral and   inferior myocardium. Doppler parameters are consistent with   abnormal left ventricular relaxation (grade 1 diastolic   dysfunction). - Aortic valve: Mildly calcified annulus. Trileaflet. - Mitral valve: Calcified annulus. There was trivial regurgitation. - Left atrium: The atrium was at the upper limits of normal in   size. - Right atrium: Central venous pressure (est): 3 mm Hg. - Atrial septum: A patent foramen ovale cannot be excluded. - Tricuspid valve: There was trivial regurgitation. - Pulmonary arteries: Systolic pressure could not be accurately   estimated. - Pericardium, extracardiac: There was no pericardial effusion.   Impressions:   - Upper normal LV chamber size with moderate LVH and LVEF   approximately 40% as discussed above. There is akinesis of the   basal inferoseptal and inferior myocardium. Grade 1 diastolic   dysfunction.  Compared to the previous study from October 2015,   LVEF is in similar range, perhaps slightly reduced. Upper normal   left atrial chamber size. Trivial tricuspid regurgitation. Cannot   exclude PFO based on limited images.     01/2018 echo Study Conclusions   - Left ventricle: The cavity size was normal. Wall thickness was   increased in a pattern of mild LVH. The estimated ejection   fraction was 35%. There is akinesis of the basal-midinferior   myocardium. There is akinesis of the mid-apicalanteroseptal and   apical myocardium. Doppler parameters are consistent with   abnormal left ventricular relaxation (grade 1 diastolic   dysfunction). - Ventricular septum: Septal motion showed abnormal function and   dyssynergy suggestive of left bundle Shenee Wignall block. - Aortic valve: Moderately calcified annulus. Trileaflet. - Mitral valve: There was mild regurgitation. - Right atrium: Central venous pressure (est): 3 mm Hg. - Atrial septum: No defect or patent foramen ovale was identified. - Tricuspid valve: There was trivial regurgitation. - Pulmonary arteries: Systolic pressure could not be accurately   estimated. - Pericardium, extracardiac: There was no pericardial effusion     11/2018 echo 1. The left ventricle has moderate-severely reduced systolic function, with an ejection fraction of 30-35%. The cavity size was normal. There is mildly increased left ventricular wall thickness. Left ventricular diastolic Doppler parameters are  consistent with impaired relaxation. Elevated mean left atrial pressure.  2. The anteroseptal, anterolateral, apical walls are hypokinetic.  3. The right ventricle has normal systolic function.  The cavity was normal. There is no increase in right ventricular wall thickness.  4. Left atrial size was moderately dilated.  5. No evidence of mitral valve stenosis.  6. The aortic valve is tricuspid no stenosis of the aortic valve.  7. Pulmonary hypertension is  indeterminant, inadequate TR jet.  8. The inferior vena cava was dilated in size with <50% respiratory variability.  9. The interatrial septum was not well visualized.   05/2021 ABI FINDINGS: Right ABI:  1.22   Left ABI:  1.14   Right Lower Extremity:  Monophasic waveforms are seen.   Left Lower Extremity:  Monophasic waveforms are seen.   IMPRESSION: Although ABI values are within normal limits, bilateral monophasic waveforms are still suspicious for significant underlying arterial occlusive disease. Further evaluation with CT angiography of the abdominal aorta and bilateral lower extremity should be considered.   Assessment and Plan   1. Acute on Chronic combined systolic/diastolic HF - medical therapy limited by soft bp's. Did not tolerate entresto.  - s/p BIV AICD, barostim  - recent admission [facility] [place] with HF exacerbation. Felt well initially after discharge however recently progressing weight gain, LE edema, SOB/DOE, orthopnea. ICD has show impedance changes consistent with accumulating fluid - diuretic dosing has been limited by low bp's. She will lower her coreg to 6.25mg  bid. Room to further decrease if needed or event change to torpol if needed. Continue losartan at 25mg  and aldactone at 25mg . Increase lasix from 20mg  daily to 40mg  alternating days with 20mg  - f/u 1 week reasses, at that appointment likely repeat blood work - LVEF has been historically somewhat lable. Based on [facility] report some decline in her LVEF, I do not have images to confirm. Monitor clinical course at this time, if progressive HF symptoms may warrant repeat ischemic testing.               Antoine Poche, M.D.

## 2023-12-22 NOTE — Patient Instructions (Addendum)
 Medication Instructions:  Your physician has recommended you make the following change in your medication:  Decrease carvedilol to 6.25 mg twice daily Change furosemide to alternate 40 mg and 20 mg daily Continue all other medications as prescribed.  Labwork: none  Testing/Procedures: none  Follow-Up: Your physician recommends that you schedule a follow-up appointment in: Wednesday, December 28, 2023 @12  noon with Dr. Wyline Mood at the [place] office.   Any Other Special Instructions Will Be Listed Below (If Applicable).  If you need a refill on your cardiac medications before your next appointment, please call your pharmacy.

## 2023-12-23 DIAGNOSIS — N1832 Chronic kidney disease, stage 3b: Secondary | ICD-10-CM | POA: Diagnosis not present

## 2023-12-23 DIAGNOSIS — E1122 Type 2 diabetes mellitus with diabetic chronic kidney disease: Secondary | ICD-10-CM | POA: Diagnosis not present

## 2023-12-23 DIAGNOSIS — I2510 Atherosclerotic heart disease of native coronary artery without angina pectoris: Secondary | ICD-10-CM | POA: Diagnosis not present

## 2023-12-23 DIAGNOSIS — I110 Hypertensive heart disease with heart failure: Secondary | ICD-10-CM | POA: Diagnosis not present

## 2023-12-23 DIAGNOSIS — I5043 Acute on chronic combined systolic (congestive) and diastolic (congestive) heart failure: Secondary | ICD-10-CM | POA: Diagnosis not present

## 2023-12-23 DIAGNOSIS — J9601 Acute respiratory failure with hypoxia: Secondary | ICD-10-CM | POA: Diagnosis not present

## 2023-12-23 DIAGNOSIS — N179 Acute kidney failure, unspecified: Secondary | ICD-10-CM | POA: Diagnosis not present

## 2023-12-23 DIAGNOSIS — J441 Chronic obstructive pulmonary disease with (acute) exacerbation: Secondary | ICD-10-CM | POA: Diagnosis not present

## 2023-12-23 DIAGNOSIS — I255 Ischemic cardiomyopathy: Secondary | ICD-10-CM | POA: Diagnosis not present

## 2023-12-23 NOTE — Telephone Encounter (Signed)
 Spoke to [name] to approve pt orders.

## 2023-12-23 NOTE — Telephone Encounter (Unsigned)
 Copied from CRM [id number]. Topic: Clinical - Home Health Verbal Orders >> Dec 23, 2023  9:54 AM Antwanette L wrote: Caller/Agency: Stacey from [facility] Callback Number: [phone] Service Requested: Physical Therapy Frequency: 1x a week for 7 weeks starting on 12/23/23 Any new concerns about the patient? No

## 2023-12-26 ENCOUNTER — Ambulatory Visit: Attending: Cardiovascular Disease

## 2023-12-26 DIAGNOSIS — I5022 Chronic systolic (congestive) heart failure: Secondary | ICD-10-CM

## 2023-12-26 DIAGNOSIS — Z95810 Presence of automatic (implantable) cardiac defibrillator: Secondary | ICD-10-CM

## 2023-12-26 NOTE — Progress Notes (Signed)
 EPIC Encounter for ICM Monitoring  Patient Name: [name] is a 73 y.o. female Date: 12/26/2023 Primary Care Physican: Anabel Halon, MD Primary Cardiologist: Branch Electrophysiologist: Mealor Bi-V Pacing:  99%        11/02/2023 Weight: 189 lbs 12/07/2023 Weight: 197 lbs (admitted to hospital) 12/13/2023 Weight: 195 lbs  12/18/2023 Weight: 191 lbs 12/19/2023 Weigh: 194 lbs 12/26/2023 Weight: 191 lbs                                                        Spoke with patient and heart failure questions reviewed.  Transmission results reviewed.  Pt reports she is feeling much better and weight has returned close to baseline.   [place]vue Thoracic impedance suggesting fluid levels have returned close normal on Lasix 20 mg every other day alternating with 40 mg every other day.    Prescribed:  Furosemide 20 mg take 1 tablet by mouth every other day alternating with 2 tablets (40 mg total) every other day.  Spironolactone 25 mg take 1 tablet daily.   Labs: 12/11/2023 Creatinine 1.03, BUN 35, Potassium 4.5, Sodium 136, GFR 57 12/10/2023 Creatinine 1.1,   BUN 36, Potassium 4.3, Sodium 138, GFR 53 12/09/2023 Creatinine 1.17, BUN 40, Potassium 4.8, Sodium 138, GFR 49 12/08/2023 Creatinine 1.3,   BUN 36, Potassium 4.6, Sodium 136, GFR 43 12/07/2023 Creatinine 1.19, BUN 26, Potassium 4.9, Sodium 141, GFR 48 12/03/2023 Creatinine 1.33, BUN 27, Potassium 4.2, Sodium 135, GFR 42  12/02/2023 Creatinine 1.28, BUN 27, Potassium 4.5, Sodium 137, GFR 44  12/01/2023 Creatinine 1.23, BUN 31, Potassium 4.2, Sodium 135, GFR 46  11/29/2023 Creatinine 1.31, BUN 34, Potassium 5.2, Sodium 135, GFR 43 A complete set of results can be found in Results Review.   Recommendations:  Encouraged to call if fluid symptoms return.  Copy sent to Dr Wyline Mood as Lorain Childes for 4/9 OV.     Follow-up plan: ICM clinic phone appointment on 01/09/2024.    91 day device clinic remote transmission 02/04/2024.        EP/Cardiology Office  Visits:   01/23/2024 with Jacolyn Reedy, PA.  Recall 10/29/2024 with Dr Nelly Laurence.   Copy of ICM check sent to Dr. Nelly Laurence.   3 month ICM trend: 12/26/2023.    12-14 Month ICM trend:     Karie Soda, RN 12/26/2023 5:00 PM

## 2023-12-28 ENCOUNTER — Encounter: Payer: Self-pay | Admitting: Cardiology

## 2023-12-28 ENCOUNTER — Ambulatory Visit: Attending: Cardiology | Admitting: Cardiology

## 2023-12-28 VITALS — BP 110/70 | HR 99 | Ht 65.0 in | Wt 194.6 lb

## 2023-12-28 DIAGNOSIS — R0789 Other chest pain: Secondary | ICD-10-CM | POA: Diagnosis not present

## 2023-12-28 DIAGNOSIS — I5022 Chronic systolic (congestive) heart failure: Secondary | ICD-10-CM

## 2023-12-28 NOTE — Patient Instructions (Signed)
 Medication Instructions:  Your physician recommends that you continue on your current medications as directed. Please refer to the Current Medication list given to you today.  *If you need a refill on your cardiac medications before your next appointment, please call your pharmacy*  Lab Work: BMET MAG  If you have labs (blood work) drawn today and your tests are completely normal, you will receive your results only by: MyChart Message (if you have MyChart) OR A paper copy in the mail If you have any lab test that is abnormal or we need to change your treatment, we will call you to review the results.  Testing/Procedures: None  Follow-Up: At [facility], you and your health needs are our priority.  As part of our continuing mission to provide you with exceptional heart care, our providers are all part of one team.  This team includes your primary Cardiologist (physician) and Advanced Practice Providers or APPs (Physician Assistants and Nurse Practitioners) who all work together to provide you with the care you need, when you need it.  Your next appointment:    Keep Follow up appointment with Jacolyn Reedy, PA-C.

## 2023-12-28 NOTE — Progress Notes (Signed)
 Clinical Summary [name] is a 73 y.o.female seen today for a focused visit for recent issues with acute on chronic combined systolic/diastolic HF   1. Chronic combined systolic and diastolic heart failure - echo 09/6107 LVEF 25-30%, restrictive diastolic dysfunction. New diagnosis at that time. Repeat echo 06/2014 LVEF 40-45% - cath 03/2014 showed RCA 99% with left to right collaterals, other arteries patent. Overall LV systolic dysfunction out of proportion to CAD.  Medically managed.  - 07/2015 echo LVEF 40%, grade I diastolic dysfunction   - Entresto causes dizziness and nausea, stopped taking. Back on losartan   -  due to low bp's we lowered losartan to 25mg  daily. Lowered lasix to 20mg  daily, may take 40mg  as needed.   -01/2018 echo LVEF 35%, grade I diastoilc dysfunction  - 11/2018 echo LVEF 30-35% - 05/03/19 had BIV AICD placed by Dr Nelly Laurence  -11/2019 LVEF 30-35% 07/2021 barostim implant  01/2022 echo: LVEF 40-45%, grade I dd     -admit [facility] 11/29/23 - soft bp's during admission, losartan and coreg held on admission. Historically soft bp's have limited medical therapy.  - 11/2023 echo [facility]: LVEF 25-30%, mild to mod MR - 3/31 ICD impedance suggesting fluid accumulation - has been taking lasix 20mg  daily    Reports right after discharge had felt fairly well. However over last few days progressively more SOB/DOE, some orthopena, increased LE edema. Home weights drifted up from 190 lbs to 195 lbs.  - home sbps low 100s, sometimes lower after taking her lasix.    - last visit we lowered coreg to 6.25mg  bid due to low bp's. Losartan continued at 25mg  and aldactone at 25mg . Lasix was increased to 40mg  alternating days with 20mg .  - 12/26/23 impedance suggested fluid level close to normal rage.  - weight today down 1 lbs since last week. Swelling and SOB has improved.    2. CAD - cath 03/2014  with 99% chronic RCA disease, otherwise patent vessels. Managed medically -no  chest pains  - started on way here - midchest, 6-7/10 in severity. Started around noon. Not positional. +SOB - compliant with meds      Other medical problems not addressed this visit   3. OSA  - on bipap at night. Followed by Dr Vassie Loll, overdue for f/u   4. Hyperlipidemia   02/2020 TC 76 HDL 33 TG 75 LDL 27 - upcoming labs on Monday     5. Chronic LBBB     6. Leg pains - back of thighs and calves with walking, resolves with rest - ABIs were normal but monophasic waveforms suggesting disease. Followed by vascular - 11/2021 CTA: 1. Atherosclerotic calcified and noncalcified plaque seen throughout the superficial femoral and popliteal arteries without high-grade stenoses. 2. Left posterior tibial artery is occluded at the mid lower leg. Two vessel runoff to the left ankle. Three-vessel runoff to the right ankle.   - seen by vacular Dr Arbie Cookey, f/u just as needed.  Past Medical History:  Diagnosis Date   Anemia    Arthritis    CAD (coronary artery disease)    a. LHC (03/2014) Lmain: nl, LAD: diff dz proximal 20%, 30-40% dz mid vessel prior 2nd diagonal, LCx: 40% in OM1, 20-30% in OM2, RCA: 99% subtotal occlusion @ crux, TIMI 1 flow, L-R collaterals to distal vessel   Cardiomyopathy (HCC) 03/28/2014   LV dysfunction out of proportion to CAD 03/28/14   CHF (congestive heart failure) (HCC)  Phreesia 10/03/2020   Chronic systolic heart failure (HCC)    a. ECHO (03/2014): EF 25-30%, akinesis enteroanteroseptal myocardium, grade III DD b. RHC (03/2014) RA 4, RV 42/4, PA 44/14 (26), PCWP 21, PA 62% Fick CO/CI 6.9 / 3.4   Diabetes mellitus    Diabetes mellitus without complication (HCC)    Phreesia 10/03/2020   Duodenal ulcer    Age 60   Dyspnea    Erosive esophagitis    Gastritis    Hypertension    Hypothyroidism    Obese    Short-term memory loss    Thyroid disease    TTP (thrombotic thrombocytopenic purpura) (HCC) 06/2005     Allergies  Allergen Reactions   Other  Other (See Comments)    FLAGYL, CIPRO, PROTONIX taken at the same time caused patient to lose her breath and have trouble breathing, requiring hospital stay at [facility], 03/23/14-per patient.    Lisinopril Cough     Current Outpatient Medications  Medication Sig Dispense Refill   acetaminophen (TYLENOL) 500 MG tablet Take 1,000 mg by mouth every 6 (six) hours as needed for moderate pain.     albuterol (PROVENTIL) (2.5 MG/3ML) 0.083% nebulizer solution Take 3 mLs (2.5 mg total) by nebulization every 6 (six) hours as needed for wheezing or shortness of breath. 150 mL 1   aspirin 81 MG chewable tablet Chew 1 tablet (81 mg total) by mouth daily.     atorvastatin (LIPITOR) 80 MG tablet TAKE 1 TABLET EVERY DAY 90 tablet 3   blood glucose meter kit and supplies Dispense based on patient and insurance preference. Use up to four times daily as directed.(FOR E11.40). 1 each 0   calcium carbonate (OSCAL) 1500 (600 Ca) MG TABS tablet Take 1 tablet by mouth daily with breakfast.     carvedilol (COREG) 6.25 MG tablet Take 1 tablet (6.25 mg total) by mouth 2 (two) times daily. 180 tablet 3   Ciclopirox 0.77 % gel APPLY ONE TIME DAILY ON THE AFFECTED TOE NAIL 30 g 0   empagliflozin (JARDIANCE) 10 MG TABS tablet Take 1 tablet (10 mg total) by mouth daily before breakfast. 14 tablet 0   furosemide (LASIX) 20 MG tablet Take 1-2 tablets (20-40 mg total) by mouth daily. Alternate 40 mg with 20 mg daily 135 tablet 1   glipiZIDE (GLUCOTROL) 5 MG tablet Take 1 tablet (5 mg total) by mouth 2 (two) times daily before a meal. 60 tablet 3   Glucagon (GVOKE HYPOPEN 2-PACK) 1 MG/0.2ML SOAJ Inject 1 mg into the skin as needed (Blood glucose < 53). 0.4 mL 5   Insulin Pen Needle 31G X 5 MM MISC Use as directed for Lantus and Novolog. 100 each 5   LANTUS SOLOSTAR 100 UNIT/ML Solostar Pen INJECT 24 UNITS INTO THE SKIN 2 (TWO) TIMES DAILY. 45 mL 3   levothyroxine (SYNTHROID) 50 MCG tablet TAKE 1 TABLET EVERY DAY BEFORE BREAKFAST 90  tablet 3   losartan (COZAAR) 25 MG tablet TAKE 1 TABLET EVERY DAY 90 tablet 3   Multiple Vitamin (MULTIVITAMIN) tablet Take 1 tablet by mouth daily.     ondansetron (ZOFRAN) 4 MG tablet Take 1 tablet (4 mg total) by mouth every 6 (six) hours. 10 tablet 0   polyethylene glycol (MIRALAX / GLYCOLAX) packet Take 17 g by mouth daily as needed for mild constipation.     promethazine-dextromethorphan (PROMETHAZINE-DM) 6.25-15 MG/5ML syrup Take 5 mLs by mouth 4 (four) times daily as needed. (Patient not taking: Reported on 12/22/2023)  118 mL 0   Semaglutide, 1 MG/DOSE, 4 MG/3ML SOPN Inject 1 mg as directed once a week. (Patient not taking: Reported on 12/22/2023) 9 mL 0   spironolactone (ALDACTONE) 25 MG tablet TAKE 1 TABLET EVERY DAY 90 tablet 3   TRUE METRIX BLOOD GLUCOSE TEST test strip TEST BLOOD SUGAR UP TO FOUR TIMES DAILY AS DIRECTED 400 strip 3   TRUEplus Lancets 33G MISC USE AS DIRECTED TO TEST BLOOD SUGAR UP TO FOUR TIMES DAILY 400 each 3   No current facility-administered medications for this visit.     Past Surgical History:  Procedure Laterality Date   BIV ICD INSERTION CRT-D N/A 05/03/2019   Procedure: BIV ICD INSERTION CRT-D;  Surgeon: Hillis Range, MD;  Location: [facility] INVASIVE CV LAB;  Service: Cardiovascular;  Laterality: N/A;   CARDIAC CATHETERIZATION     COLONOSCOPY  08/29/2012   [id number] diverticulosis   COLONOSCOPY N/A 05/28/2015   Procedure: COLONOSCOPY;  Surgeon: Corbin Ade, MD;  Location: [facility] ENDO SUITE;  Service: Endoscopy;  Laterality: N/A;  1015   ECTOPIC PREGNANCY SURGERY     ESOPHAGOGASTRODUODENOSCOPY  04/20/2012   [id number] antral gastritis/Erosive reflux esophagitis   LEFT AND RIGHT HEART CATHETERIZATION WITH CORONARY ANGIOGRAM N/A 03/28/2014   Procedure: LEFT AND RIGHT HEART CATHETERIZATION WITH CORONARY ANGIOGRAM;  Surgeon: Peter M Swaziland, MD;  Location: [facility] CATH LAB;  Service: Cardiovascular;  Laterality: N/A;   SPLENECTOMY  07/20/2005     Allergies   Allergen Reactions   Other Other (See Comments)    FLAGYL, CIPRO, PROTONIX taken at the same time caused patient to lose her breath and have trouble breathing, requiring hospital stay at [facility], 03/23/14-per patient.    Lisinopril Cough      Family History  Problem Relation Age of Onset   Heart failure Mother    Cirrhosis Father        ETOH   Heart disease Brother    Diabetes Daughter    Breast cancer Maternal Aunt    Colon cancer Neg Hx      Social History Ms. Bradfield reports that she quit smoking about 45 years ago. Her smoking use included cigarettes. She started smoking about 57 years ago. She has a 3 pack-year smoking history. She has never used smokeless tobacco. Ms. Pugsley reports no history of alcohol use.     Physical Examination Today's Vitals   12/28/23 1218  BP: 110/70  Pulse: 99  SpO2: 96%  Weight: 194 lb 9.6 oz (88.3 kg)  Height: 5\' 5"  (1.651 m)  PainSc: 6   PainLoc: Chest   Body mass index is 32.38 kg/m.  Gen: resting comfortably, no acute distress HEENT: no scleral icterus, pupils equal round and reactive, no palptable cervical adenopathy,  CV: RRR, no mrg, no jvd Resp: Clear to auscultation bilaterally GI: abdomen is soft, non-tender, non-distended, normal bowel sounds, no hepatosplenomegaly MSK: extremities are warm, no edema.  Skin: warm, no rash Neuro:  no focal deficits Psych: appropriate affect   Diagnostic Studies 03/2014 echo Study Conclusions  - Left ventricle: The cavity size was normal. There was mild focal basal hypertrophy of the septum. Systolic function was severely reduced. The estimated ejection fraction was in the range of 25% to 30%. There is akinesis of the entireanteroseptal myocardium. Doppler parameters are consistent with a reversible restrictive pattern, indicative of decreased left ventricular diastolic compliance and/or increased left atrial pressure (grade 3 diastolic dysfunction). - Mitral valve: There was mild  regurgitation. - Left atrium: The atrium was  moderately dilated.  03/2014 Cath Procedural Findings:   Hemodynamics   RA 8/0 mean 4 mm Hg   RV 42/4 mm Hg   PA 44/14 mean 26 mm Hg   PCWP 20/28 mean 21 mm Hg   LV 98/18 mm Hg   AO 95/54 mean 72 mm Hg   Oxygen saturations:   PA 62%   AO 90%   Cardiac Output (Fick) 6.9 L/min   Cardiac Index (Fick) 3.4 L/min/meter squared   Coronary angiography:   Coronary dominance: right   Left mainstem: Normal   Left anterior descending (LAD): Diffuse disease in the proximal vessel up to 20%. 30-40% disease in the mid vessel prior to the second diagonal.   Left circumflex (LCx): 40% in OM1. 20-30% in OM2. Otherwise no significant disease.   Right coronary artery (RCA): 99% subtotal occlusion at the crux. TIMI 1 flow. Left to right collaterals to the distal vessel.   Left ventriculography: Left ventricular systolic function is abnormal, LVEF is estimated at 25%. There is inferior akinesis and severe global hypokinesis. There is mild mitral regurgitation   Final Conclusions:   1. Single vessel obstructive CAD with subtotal occlusion of the RCA at the crux.   2. Severe LV dysfunction.   3. Normal Right heart pressures. Elevated PCWP.   Recommendations: Medical management. Her degree of LV dysfunction is out of proportion to her CAD. She has no anginal symptoms so PCI of RCA not indicated. The inferior wall appears scarred.   07/2015 echo Study Conclusions   - Left ventricle: The cavity size was at the upper limits of   normal. Wall thickness was increased in a pattern of moderate   LVH. The estimated ejection fraction was 40% (similar calculation   per biplane speckle tracking, reduced global longitudinal strain   of -14.3%). There is akinesis of the basalinferolateral and   inferior myocardium. Doppler parameters are consistent with   abnormal left ventricular relaxation (grade 1 diastolic   dysfunction). - Aortic valve: Mildly calcified annulus.  Trileaflet. - Mitral valve: Calcified annulus. There was trivial regurgitation. - Left atrium: The atrium was at the upper limits of normal in   size. - Right atrium: Central venous pressure (est): 3 mm Hg. - Atrial septum: A patent foramen ovale cannot be excluded. - Tricuspid valve: There was trivial regurgitation. - Pulmonary arteries: Systolic pressure could not be accurately   estimated. - Pericardium, extracardiac: There was no pericardial effusion.   Impressions:   - Upper normal LV chamber size with moderate LVH and LVEF   approximately 40% as discussed above. There is akinesis of the   basal inferoseptal and inferior myocardium. Grade 1 diastolic   dysfunction. Compared to the previous study from October 2015,   LVEF is in similar range, perhaps slightly reduced. Upper normal   left atrial chamber size. Trivial tricuspid regurgitation. Cannot   exclude PFO based on limited images.     01/2018 echo Study Conclusions   - Left ventricle: The cavity size was normal. Wall thickness was   increased in a pattern of mild LVH. The estimated ejection   fraction was 35%. There is akinesis of the basal-midinferior   myocardium. There is akinesis of the mid-apicalanteroseptal and   apical myocardium. Doppler parameters are consistent with   abnormal left ventricular relaxation (grade 1 diastolic   dysfunction). - Ventricular septum: Septal motion showed abnormal function and   dyssynergy suggestive of left bundle Robert Sunga block. - Aortic valve: Moderately calcified annulus. Trileaflet. -  Mitral valve: There was mild regurgitation. - Right atrium: Central venous pressure (est): 3 mm Hg. - Atrial septum: No defect or patent foramen ovale was identified. - Tricuspid valve: There was trivial regurgitation. - Pulmonary arteries: Systolic pressure could not be accurately   estimated. - Pericardium, extracardiac: There was no pericardial effusion     11/2018 echo 1. The left ventricle  has moderate-severely reduced systolic function, with an ejection fraction of 30-35%. The cavity size was normal. There is mildly increased left ventricular wall thickness. Left ventricular diastolic Doppler parameters are  consistent with impaired relaxation. Elevated mean left atrial pressure.  2. The anteroseptal, anterolateral, apical walls are hypokinetic.  3. The right ventricle has normal systolic function. The cavity was normal. There is no increase in right ventricular wall thickness.  4. Left atrial size was moderately dilated.  5. No evidence of mitral valve stenosis.  6. The aortic valve is tricuspid no stenosis of the aortic valve.  7. Pulmonary hypertension is indeterminant, inadequate TR jet.  8. The inferior vena cava was dilated in size with <50% respiratory variability.  9. The interatrial septum was not well visualized.   05/2021 ABI FINDINGS: Right ABI:  1.22   Left ABI:  1.14   Right Lower Extremity:  Monophasic waveforms are seen.   Left Lower Extremity:  Monophasic waveforms are seen.   IMPRESSION: Although ABI values are within normal limits, bilateral monophasic waveforms are still suspicious for significant underlying arterial occlusive disease. Further evaluation with CT angiography of the abdominal aorta and bilateral lower extremity should be considered.    Assessment and Plan   1. Acute on Chronic combined systolic/diastolic HF - medical therapy limited by soft bp's. Did not tolerate entresto.  - s/p BIV AICD, barostim  - volume status improved by exam, impedance improved by ICD check. Symptoms improving - continue current medication regimen. Last visit we increased lasix to 40mg  alternating days with 20mg . She has chronic intermittent low bp's, we lowered her coreg to 6.25mg  bid - update bmet/mg  - LVEF has been historically somewhat lable. Based on [facility] report some decline in her LVEF, I do not have images to confirm. Monitor clinical course at  this time, if progressive HF symptoms may warrant repeat ischemic testing.      2.CAD/chest pressure - recommended ER evaluation as patient with chest pressure in clinic but she declines - EKG limited in that she is Vpaced - advised is significant progression in symptoms would again recommend ER evaluation.      Antoine Poche, M.D.

## 2023-12-30 ENCOUNTER — Other Ambulatory Visit: Payer: Self-pay

## 2023-12-30 ENCOUNTER — Other Ambulatory Visit: Payer: Self-pay | Admitting: Internal Medicine

## 2023-12-30 DIAGNOSIS — I5043 Acute on chronic combined systolic (congestive) and diastolic (congestive) heart failure: Secondary | ICD-10-CM | POA: Diagnosis not present

## 2023-12-30 DIAGNOSIS — N179 Acute kidney failure, unspecified: Secondary | ICD-10-CM | POA: Diagnosis not present

## 2023-12-30 DIAGNOSIS — I2510 Atherosclerotic heart disease of native coronary artery without angina pectoris: Secondary | ICD-10-CM | POA: Diagnosis not present

## 2023-12-30 DIAGNOSIS — I255 Ischemic cardiomyopathy: Secondary | ICD-10-CM | POA: Diagnosis not present

## 2023-12-30 DIAGNOSIS — N1832 Chronic kidney disease, stage 3b: Secondary | ICD-10-CM | POA: Diagnosis not present

## 2023-12-30 DIAGNOSIS — J441 Chronic obstructive pulmonary disease with (acute) exacerbation: Secondary | ICD-10-CM | POA: Diagnosis not present

## 2023-12-30 DIAGNOSIS — J9601 Acute respiratory failure with hypoxia: Secondary | ICD-10-CM | POA: Diagnosis not present

## 2023-12-30 DIAGNOSIS — E1122 Type 2 diabetes mellitus with diabetic chronic kidney disease: Secondary | ICD-10-CM | POA: Diagnosis not present

## 2023-12-30 DIAGNOSIS — Z794 Long term (current) use of insulin: Secondary | ICD-10-CM

## 2023-12-30 DIAGNOSIS — I110 Hypertensive heart disease with heart failure: Secondary | ICD-10-CM | POA: Diagnosis not present

## 2023-12-30 MED ORDER — EMPAGLIFLOZIN 10 MG PO TABS: 10.0000 mg | ORAL_TABLET | Freq: Every day | ORAL | 11 refills | Status: DC

## 2023-12-30 NOTE — Telephone Encounter (Signed)
 This is a [place] pt.

## 2024-01-03 ENCOUNTER — Other Ambulatory Visit: Payer: Self-pay

## 2024-01-03 MED ORDER — EMPAGLIFLOZIN 10 MG PO TABS: 10.0000 mg | ORAL_TABLET | Freq: Every day | ORAL | 11 refills | Status: DC

## 2024-01-04 DIAGNOSIS — I110 Hypertensive heart disease with heart failure: Secondary | ICD-10-CM | POA: Diagnosis not present

## 2024-01-04 DIAGNOSIS — N179 Acute kidney failure, unspecified: Secondary | ICD-10-CM | POA: Diagnosis not present

## 2024-01-04 DIAGNOSIS — E1122 Type 2 diabetes mellitus with diabetic chronic kidney disease: Secondary | ICD-10-CM | POA: Diagnosis not present

## 2024-01-04 DIAGNOSIS — I255 Ischemic cardiomyopathy: Secondary | ICD-10-CM | POA: Diagnosis not present

## 2024-01-04 DIAGNOSIS — J441 Chronic obstructive pulmonary disease with (acute) exacerbation: Secondary | ICD-10-CM | POA: Diagnosis not present

## 2024-01-04 DIAGNOSIS — I5043 Acute on chronic combined systolic (congestive) and diastolic (congestive) heart failure: Secondary | ICD-10-CM | POA: Diagnosis not present

## 2024-01-04 DIAGNOSIS — J9601 Acute respiratory failure with hypoxia: Secondary | ICD-10-CM | POA: Diagnosis not present

## 2024-01-04 DIAGNOSIS — N1832 Chronic kidney disease, stage 3b: Secondary | ICD-10-CM | POA: Diagnosis not present

## 2024-01-04 DIAGNOSIS — I2510 Atherosclerotic heart disease of native coronary artery without angina pectoris: Secondary | ICD-10-CM | POA: Diagnosis not present

## 2024-01-05 DIAGNOSIS — I5043 Acute on chronic combined systolic (congestive) and diastolic (congestive) heart failure: Secondary | ICD-10-CM | POA: Diagnosis not present

## 2024-01-05 DIAGNOSIS — I130 Hypertensive heart and chronic kidney disease with heart failure and stage 1 through stage 4 chronic kidney disease, or unspecified chronic kidney disease: Secondary | ICD-10-CM | POA: Diagnosis not present

## 2024-01-05 DIAGNOSIS — I255 Ischemic cardiomyopathy: Secondary | ICD-10-CM | POA: Diagnosis not present

## 2024-01-05 DIAGNOSIS — N179 Acute kidney failure, unspecified: Secondary | ICD-10-CM | POA: Diagnosis not present

## 2024-01-05 DIAGNOSIS — E1122 Type 2 diabetes mellitus with diabetic chronic kidney disease: Secondary | ICD-10-CM | POA: Diagnosis not present

## 2024-01-05 DIAGNOSIS — N1832 Chronic kidney disease, stage 3b: Secondary | ICD-10-CM | POA: Diagnosis not present

## 2024-01-05 DIAGNOSIS — J441 Chronic obstructive pulmonary disease with (acute) exacerbation: Secondary | ICD-10-CM | POA: Diagnosis not present

## 2024-01-05 DIAGNOSIS — I2510 Atherosclerotic heart disease of native coronary artery without angina pectoris: Secondary | ICD-10-CM | POA: Diagnosis not present

## 2024-01-05 DIAGNOSIS — J9601 Acute respiratory failure with hypoxia: Secondary | ICD-10-CM | POA: Diagnosis not present

## 2024-01-07 ENCOUNTER — Other Ambulatory Visit: Payer: Self-pay | Admitting: Internal Medicine

## 2024-01-07 DIAGNOSIS — R0600 Dyspnea, unspecified: Secondary | ICD-10-CM

## 2024-01-09 ENCOUNTER — Ambulatory Visit: Attending: Cardiovascular Disease

## 2024-01-09 DIAGNOSIS — Z95810 Presence of automatic (implantable) cardiac defibrillator: Secondary | ICD-10-CM

## 2024-01-09 DIAGNOSIS — I5022 Chronic systolic (congestive) heart failure: Secondary | ICD-10-CM | POA: Diagnosis not present

## 2024-01-10 NOTE — Progress Notes (Deleted)
  Cardiology Office Note:  .   Date:  01/10/2024  ID:  [name], DOB 01/14/1951, MRN [id number] PCP: Meldon Sport, MD  [facility] Providers Cardiologist:  Armida Lander, MD Electrophysiologist:  Efraim Grange, MD { Click to update primary MD,subspecialty MD or APP then REFRESH:1}   History of Present Illness: .   [name] is a 73 y.o. female with history of CAD cath 2015 99% RCA treated medically, chronic combined systolic and diastolic CHF, echo 01/2022 LVEF 40-45%, admit to [facility] 11/29/23 EF 25-30%, BP low and losartan  and coreg  held, ICD impedance 3/31 suggested fluid accumulation. Patient saw Dr. Amanda Jungling 12/28/23 with chest pain and recommended she go to ED but she declined.  ROS: ***  Studies Reviewed: Aaron Aas         Prior CV Studies: {Select studies to display:[id number]}  ***  Risk Assessment/Calculations:   {Does this patient have ATRIAL FIBRILLATION?:[id number]} No BP recorded.  {Refresh Note OR Click here to enter BP  :1}***       Physical Exam:   VS:  There were no vitals taken for this visit.   Wt Readings from Last 3 Encounters:  12/28/23 194 lb 9.6 oz (88.3 kg)  12/22/23 195 lb 6.4 oz (88.6 kg)  12/13/23 195 lb (88.5 kg)    GEN: Well nourished, well developed in no acute distress NECK: No JVD; No carotid bruits CARDIAC: ***RRR, no murmurs, rubs, gallops RESPIRATORY:  Clear to auscultation without rales, wheezing or rhonchi  ABDOMEN: Soft, non-tender, non-distended EXTREMITIES:  No edema; No deformity   ASSESSMENT AND PLAN: .    Chronic combined systolic/diastolic HF - medical therapy limited by soft bp's. Did not tolerate entresto .  - s/p BIV AICD, barostim   - volume status improved by exam, impedance improved by ICD check. Symptoms improving - continue current medication regimen. Last visit we increased lasix  to 40mg  alternating days with 20mg . She has chronic intermittent low bp's, we lowered her coreg  to 6.25mg  bid - update bmet/mg   - LVEF has been historically somewhat lable. Based on [facility] report some decline in her LVEF, 25-30%, if CHF symptoms continue repeat ischemic testing may be warranted.       2.CAD/chest pressure - recommended ER evaluation as patient with chest pressure in clinic but she declines - EKG limited in that she is Vpaced - advised is significant progression in symptoms would again recommend ER evaluation.      {Are you ordering a CV Procedure (e.g. stress test, cath, DCCV, TEE, etc)?   Press F2        :[id number]}  Dispo: ***  Signed, Theotis Flake, PA-C

## 2024-01-11 DIAGNOSIS — E1122 Type 2 diabetes mellitus with diabetic chronic kidney disease: Secondary | ICD-10-CM | POA: Diagnosis not present

## 2024-01-11 DIAGNOSIS — I130 Hypertensive heart and chronic kidney disease with heart failure and stage 1 through stage 4 chronic kidney disease, or unspecified chronic kidney disease: Secondary | ICD-10-CM | POA: Diagnosis not present

## 2024-01-11 DIAGNOSIS — I2510 Atherosclerotic heart disease of native coronary artery without angina pectoris: Secondary | ICD-10-CM | POA: Diagnosis not present

## 2024-01-11 DIAGNOSIS — N1832 Chronic kidney disease, stage 3b: Secondary | ICD-10-CM | POA: Diagnosis not present

## 2024-01-11 DIAGNOSIS — J9601 Acute respiratory failure with hypoxia: Secondary | ICD-10-CM | POA: Diagnosis not present

## 2024-01-11 DIAGNOSIS — N179 Acute kidney failure, unspecified: Secondary | ICD-10-CM | POA: Diagnosis not present

## 2024-01-11 DIAGNOSIS — I5043 Acute on chronic combined systolic (congestive) and diastolic (congestive) heart failure: Secondary | ICD-10-CM | POA: Diagnosis not present

## 2024-01-11 DIAGNOSIS — J441 Chronic obstructive pulmonary disease with (acute) exacerbation: Secondary | ICD-10-CM | POA: Diagnosis not present

## 2024-01-11 DIAGNOSIS — I255 Ischemic cardiomyopathy: Secondary | ICD-10-CM | POA: Diagnosis not present

## 2024-01-11 NOTE — Progress Notes (Signed)
 EPIC Encounter for ICM Monitoring  Patient Name: [name] is a 73 y.o. female Date: 01/11/2024 Primary Care Physican: Meldon Sport, MD Primary Cardiologist: Branch Electrophysiologist: Mealor Bi-V Pacing:  99%        11/02/2023 Weight: 189 lbs 12/07/2023 Weight: 197 lbs (admitted to hospital) 12/13/2023 Weight: 195 lbs  12/18/2023 Weight: 191 lbs 12/19/2023 Weigh: 194 lbs 12/26/2023 Weight: 191 lbs 01/11/2024 Weight: 190 lbs                                                        Spoke with patient and heart failure questions reviewed.  Transmission results reviewed.  Pt reports she is feeling well.   [place] Thoracic impedance suggesting normal fluid levels 4/4.    Prescribed:  Furosemide  20 mg take 1 tablet by mouth every other day alternating with 2 tablets (40 mg total) every other day.  Spironolactone  25 mg take 1 tablet daily.   Labs: 12/11/2023 Creatinine 1.03, BUN 35, Potassium 4.5, Sodium 136, GFR 57 12/10/2023 Creatinine 1.1,   BUN 36, Potassium 4.3, Sodium 138, GFR 53 12/09/2023 Creatinine 1.17, BUN 40, Potassium 4.8, Sodium 138, GFR 49 12/08/2023 Creatinine 1.3,   BUN 36, Potassium 4.6, Sodium 136, GFR 43 12/07/2023 Creatinine 1.19, BUN 26, Potassium 4.9, Sodium 141, GFR 48 12/03/2023 Creatinine 1.33, BUN 27, Potassium 4.2, Sodium 135, GFR 42  12/02/2023 Creatinine 1.28, BUN 27, Potassium 4.5, Sodium 137, GFR 44  12/01/2023 Creatinine 1.23, BUN 31, Potassium 4.2, Sodium 135, GFR 46  11/29/2023 Creatinine 1.31, BUN 34, Potassium 5.2, Sodium 135, GFR 43 A complete set of results can be found in Results Review.   Recommendations:  No changes and encouraged to call if experiencing any fluid symptoms.   Follow-up plan: ICM clinic phone appointment on 01/09/2024.    91 day device clinic remote transmission 02/04/2024.        EP/Cardiology Office Visits:   01/23/2024 with Theotis Flake, PA.  Recall 10/29/2024 with Dr Arlester Ladd.   Copy of ICM check sent to Dr. Arlester Ladd.  3 month  ICM trend: 01/11/2024.    12-14 Month ICM trend:     Almyra Jain, RN 01/11/2024 2:00 PM

## 2024-01-17 DIAGNOSIS — N1832 Chronic kidney disease, stage 3b: Secondary | ICD-10-CM | POA: Diagnosis not present

## 2024-01-17 DIAGNOSIS — I2510 Atherosclerotic heart disease of native coronary artery without angina pectoris: Secondary | ICD-10-CM | POA: Diagnosis not present

## 2024-01-17 DIAGNOSIS — N179 Acute kidney failure, unspecified: Secondary | ICD-10-CM | POA: Diagnosis not present

## 2024-01-17 DIAGNOSIS — I5043 Acute on chronic combined systolic (congestive) and diastolic (congestive) heart failure: Secondary | ICD-10-CM | POA: Diagnosis not present

## 2024-01-17 DIAGNOSIS — E1122 Type 2 diabetes mellitus with diabetic chronic kidney disease: Secondary | ICD-10-CM | POA: Diagnosis not present

## 2024-01-17 DIAGNOSIS — I130 Hypertensive heart and chronic kidney disease with heart failure and stage 1 through stage 4 chronic kidney disease, or unspecified chronic kidney disease: Secondary | ICD-10-CM | POA: Diagnosis not present

## 2024-01-17 DIAGNOSIS — J441 Chronic obstructive pulmonary disease with (acute) exacerbation: Secondary | ICD-10-CM | POA: Diagnosis not present

## 2024-01-17 DIAGNOSIS — J9601 Acute respiratory failure with hypoxia: Secondary | ICD-10-CM | POA: Diagnosis not present

## 2024-01-17 DIAGNOSIS — I255 Ischemic cardiomyopathy: Secondary | ICD-10-CM | POA: Diagnosis not present

## 2024-01-23 ENCOUNTER — Encounter: Payer: Self-pay | Admitting: Cardiology

## 2024-01-23 ENCOUNTER — Ambulatory Visit: Attending: Physician Assistant | Admitting: Physician Assistant

## 2024-01-25 DIAGNOSIS — I2510 Atherosclerotic heart disease of native coronary artery without angina pectoris: Secondary | ICD-10-CM | POA: Diagnosis not present

## 2024-01-25 DIAGNOSIS — E1122 Type 2 diabetes mellitus with diabetic chronic kidney disease: Secondary | ICD-10-CM | POA: Diagnosis not present

## 2024-01-25 DIAGNOSIS — I130 Hypertensive heart and chronic kidney disease with heart failure and stage 1 through stage 4 chronic kidney disease, or unspecified chronic kidney disease: Secondary | ICD-10-CM | POA: Diagnosis not present

## 2024-01-25 DIAGNOSIS — J441 Chronic obstructive pulmonary disease with (acute) exacerbation: Secondary | ICD-10-CM | POA: Diagnosis not present

## 2024-01-25 DIAGNOSIS — N179 Acute kidney failure, unspecified: Secondary | ICD-10-CM | POA: Diagnosis not present

## 2024-01-25 DIAGNOSIS — N1832 Chronic kidney disease, stage 3b: Secondary | ICD-10-CM | POA: Diagnosis not present

## 2024-01-25 DIAGNOSIS — J9601 Acute respiratory failure with hypoxia: Secondary | ICD-10-CM | POA: Diagnosis not present

## 2024-01-25 DIAGNOSIS — I5043 Acute on chronic combined systolic (congestive) and diastolic (congestive) heart failure: Secondary | ICD-10-CM | POA: Diagnosis not present

## 2024-01-25 DIAGNOSIS — I255 Ischemic cardiomyopathy: Secondary | ICD-10-CM | POA: Diagnosis not present

## 2024-02-01 DIAGNOSIS — J9601 Acute respiratory failure with hypoxia: Secondary | ICD-10-CM | POA: Diagnosis not present

## 2024-02-01 DIAGNOSIS — I2510 Atherosclerotic heart disease of native coronary artery without angina pectoris: Secondary | ICD-10-CM | POA: Diagnosis not present

## 2024-02-01 DIAGNOSIS — I255 Ischemic cardiomyopathy: Secondary | ICD-10-CM | POA: Diagnosis not present

## 2024-02-01 DIAGNOSIS — I130 Hypertensive heart and chronic kidney disease with heart failure and stage 1 through stage 4 chronic kidney disease, or unspecified chronic kidney disease: Secondary | ICD-10-CM | POA: Diagnosis not present

## 2024-02-01 DIAGNOSIS — N1832 Chronic kidney disease, stage 3b: Secondary | ICD-10-CM | POA: Diagnosis not present

## 2024-02-01 DIAGNOSIS — J441 Chronic obstructive pulmonary disease with (acute) exacerbation: Secondary | ICD-10-CM | POA: Diagnosis not present

## 2024-02-01 DIAGNOSIS — E1122 Type 2 diabetes mellitus with diabetic chronic kidney disease: Secondary | ICD-10-CM | POA: Diagnosis not present

## 2024-02-01 DIAGNOSIS — N179 Acute kidney failure, unspecified: Secondary | ICD-10-CM | POA: Diagnosis not present

## 2024-02-01 DIAGNOSIS — I5043 Acute on chronic combined systolic (congestive) and diastolic (congestive) heart failure: Secondary | ICD-10-CM | POA: Diagnosis not present

## 2024-02-06 ENCOUNTER — Ambulatory Visit (INDEPENDENT_AMBULATORY_CARE_PROVIDER_SITE_OTHER): Payer: Medicare HMO

## 2024-02-06 DIAGNOSIS — I5022 Chronic systolic (congestive) heart failure: Secondary | ICD-10-CM

## 2024-02-08 LAB — CUP PACEART REMOTE DEVICE CHECK
Battery Remaining Longevity: 18 mo
Battery Remaining Percentage: 27 %
Battery Voltage: 2.87 V
Brady Statistic AP VP Percent: 1 %
Brady Statistic AP VS Percent: 1 %
Brady Statistic AS VP Percent: 98 %
Brady Statistic AS VS Percent: 1.4 %
Brady Statistic RA Percent Paced: 1 %
Date Time Interrogation Session: 20250521093100
HighPow Impedance: 54 Ohm
HighPow Impedance: 54 Ohm
Implantable Lead Connection Status: 753985
Implantable Lead Connection Status: 753985
Implantable Lead Connection Status: 753985
Implantable Lead Implant Date: 20200813
Implantable Lead Implant Date: 20200813
Implantable Lead Implant Date: 20200813
Implantable Lead Location: 753858
Implantable Lead Location: 753859
Implantable Lead Location: 753860
Implantable Pulse Generator Implant Date: 20200813
Lead Channel Impedance Value: 340 Ohm
Lead Channel Impedance Value: 380 Ohm
Lead Channel Impedance Value: 390 Ohm
Lead Channel Pacing Threshold Amplitude: 0.625 V
Lead Channel Pacing Threshold Amplitude: 0.75 V
Lead Channel Pacing Threshold Amplitude: 2 V
Lead Channel Pacing Threshold Pulse Width: 0.4 ms
Lead Channel Pacing Threshold Pulse Width: 0.5 ms
Lead Channel Pacing Threshold Pulse Width: 1 ms
Lead Channel Sensing Intrinsic Amplitude: 11.8 mV
Lead Channel Sensing Intrinsic Amplitude: 3.2 mV
Lead Channel Setting Pacing Amplitude: 2 V
Lead Channel Setting Pacing Amplitude: 2 V
Lead Channel Setting Pacing Amplitude: 2.5 V
Lead Channel Setting Pacing Pulse Width: 0.5 ms
Lead Channel Setting Pacing Pulse Width: 1 ms
Lead Channel Setting Sensing Sensitivity: 0.5 mV
Pulse Gen Serial Number: 9891600
Zone Setting Status: 755011

## 2024-02-14 ENCOUNTER — Encounter

## 2024-02-16 ENCOUNTER — Ambulatory Visit: Payer: Self-pay | Admitting: Cardiovascular Disease

## 2024-02-16 DIAGNOSIS — H5203 Hypermetropia, bilateral: Secondary | ICD-10-CM | POA: Diagnosis not present

## 2024-02-16 NOTE — Progress Notes (Signed)
 No ICM remote transmission received for 02/14/2024 and next ICM transmission scheduled for 02/21/2024.

## 2024-02-17 ENCOUNTER — Ambulatory Visit (INDEPENDENT_AMBULATORY_CARE_PROVIDER_SITE_OTHER): Payer: Medicare HMO | Admitting: Internal Medicine

## 2024-02-17 ENCOUNTER — Telehealth (HOSPITAL_BASED_OUTPATIENT_CLINIC_OR_DEPARTMENT_OTHER): Payer: Self-pay

## 2024-02-17 ENCOUNTER — Encounter: Payer: Self-pay | Admitting: Internal Medicine

## 2024-02-17 VITALS — BP 102/64 | HR 107 | Ht 65.0 in | Wt 190.0 lb

## 2024-02-17 DIAGNOSIS — E782 Mixed hyperlipidemia: Secondary | ICD-10-CM | POA: Diagnosis not present

## 2024-02-17 DIAGNOSIS — E039 Hypothyroidism, unspecified: Secondary | ICD-10-CM

## 2024-02-17 DIAGNOSIS — E1165 Type 2 diabetes mellitus with hyperglycemia: Secondary | ICD-10-CM | POA: Diagnosis not present

## 2024-02-17 DIAGNOSIS — Z23 Encounter for immunization: Secondary | ICD-10-CM | POA: Diagnosis not present

## 2024-02-17 DIAGNOSIS — Z1231 Encounter for screening mammogram for malignant neoplasm of breast: Secondary | ICD-10-CM | POA: Diagnosis not present

## 2024-02-17 DIAGNOSIS — I10 Essential (primary) hypertension: Secondary | ICD-10-CM | POA: Diagnosis not present

## 2024-02-17 DIAGNOSIS — R0600 Dyspnea, unspecified: Secondary | ICD-10-CM

## 2024-02-17 DIAGNOSIS — I5042 Chronic combined systolic (congestive) and diastolic (congestive) heart failure: Secondary | ICD-10-CM

## 2024-02-17 DIAGNOSIS — Z780 Asymptomatic menopausal state: Secondary | ICD-10-CM

## 2024-02-17 DIAGNOSIS — Z794 Long term (current) use of insulin: Secondary | ICD-10-CM

## 2024-02-17 MED ORDER — ALBUTEROL SULFATE (2.5 MG/3ML) 0.083% IN NEBU: 2.5000 mg | INHALATION_SOLUTION | Freq: Four times a day (QID) | RESPIRATORY_TRACT | 1 refills | Status: DC | PRN

## 2024-02-17 NOTE — Assessment & Plan Note (Signed)
 On ARB, Spironolactone , Jardiance  and Lasix  Appears euvolemic currently Followed by cardiology

## 2024-02-17 NOTE — Patient Instructions (Signed)
 Please contact Dr Hortense Lyons office for evaluation of sleep apnea - [facility] in [place], please call [phone].  Please continue to take medications as prescribed.  Please continue to follow low carb diet and perform moderate exercise/walking as tolerated.

## 2024-02-17 NOTE — Assessment & Plan Note (Addendum)
 Lab Results  Component Value Date   HGBA1C 6.8 12/07/2023   Well-controlled - improved now from >15.5 in 09/24 On Lantus  20 U QD, Ozempic  1 mg qw, Jardiance  10 mg QD and Glipizide  5 mg BID Advised to continue Lantus  20 U once daily for now Had referred to endocrinology, but did not follow up Advised to follow diabetic diet On statin F/u CMP and lipid panel Diabetic eye exam: Advised to follow up with Ophthalmology for diabetic eye exam  Avoid skipping any meal. Advised to keep snacks or orange juice accessible most of the time. Gvoke hypopen  for hypoglycemia.

## 2024-02-17 NOTE — Assessment & Plan Note (Signed)
 Her dyspnea is mostly due to CHF Likely has some component of reactive airway disease She had some improvement in her dyspnea with nebulization during hospital stay Prescribed albuterol  neb and nebulizer machine

## 2024-02-17 NOTE — Progress Notes (Signed)
 Established Patient Office Visit  Subjective:  Patient ID: [name], female    DOB: [birth year]  Age: 73 y.o. MRN: [id number]  CC:  Chief Complaint  Patient presents with   Medical Management of Chronic Issues    3 month f/u, needs refills. Has questions about cpap.     HPI [name] is a 73 y.o. female with past medical history of chronic combined systolic and diastolic CHF, ischemic cardiomyopathy status post ICD placement, hypertension, hyperlipidemia, type 2 diabetes mellitus, obstructive sleep apnea on BiPAP at night, obesity, coronary artery disease, GERD and hypothyroidism who presents for f/u of her chronic medical conditions.  Type II DM: She checks her blood glucose regularly with glucometer.  Her blood glucose has been improving since starting Ozempic  and taking Lantus  20 units QD.  In the last 3 months, her blood glucose has been around 120-150 with few readings above 200.  She denies major symptoms from Ozempic .  She is also taking glipizide  5 mg twice daily. Her last HbA1c was 6.8 in 03/25. She has not noticed any episodes of hypoglycemia since decreasing the dose of Lantus . She has chronic fatigue and hypersomnolence, but denies any polyuria or polyphagia.  She admits that she had been eating sweets and has cut down.  She reports chronic fatigue and visual disturbance -blurry vision.  Of note, she has a history of type II DM and has not seen Ophthalmologist for it recently.  Denies any redness or discharge from the eyes.  Denies any eye pain currently.  HTN and CHF: Her blood pressure has been stable at home. Takes medications regularly. Patient denies headache, dizziness, chest pain.  She has intermittent leg swelling.  She has AICD in place, followed by cardiology and EP cardiology.  Hypothyroidism: She has been taking levothyroxine  50 mcg daily.  She has chronic fatigue, but denies any recent change in weight.  Gait imbalance: She has chronic balance problems.   She uses walker for support.  She also has history of diabetic neuropathy.   Past Medical History:  Diagnosis Date   Anemia    Arthritis    CAD (coronary artery disease)    a. LHC (03/2014) Lmain: nl, LAD: diff dz proximal 20%, 30-40% dz mid vessel prior 2nd diagonal, LCx: 40% in OM1, 20-30% in OM2, RCA: 99% subtotal occlusion @ crux, TIMI 1 flow, L-R collaterals to distal vessel   Cardiomyopathy (HCC) 03/28/2014   LV dysfunction out of proportion to CAD 03/28/14   CHF (congestive heart failure) (HCC)    Phreesia 10/03/2020   Chronic systolic heart failure (HCC)    a. ECHO (03/2014): EF 25-30%, akinesis enteroanteroseptal myocardium, grade III DD b. RHC (03/2014) RA 4, RV 42/4, PA 44/14 (26), PCWP 21, PA 62% Fick CO/CI 6.9 / 3.4   Diabetes mellitus    Diabetes mellitus without complication (HCC)    Phreesia 10/03/2020   Duodenal ulcer    Age 76   Dyspnea    Erosive esophagitis    Gastritis    Hypertension    Hypothyroidism    Obese    Short-term memory loss    Thyroid  disease    TTP (thrombotic thrombocytopenic purpura) (HCC) 06/2005    Past Surgical History:  Procedure Laterality Date   BIV ICD INSERTION CRT-D N/A 05/03/2019   Procedure: BIV ICD INSERTION CRT-D;  Surgeon: Jolly Needle, MD;  Location: [facility];  Service: Cardiovascular;  Laterality: N/A;   CARDIAC CATHETERIZATION     COLONOSCOPY  08/29/2012   [id number] diverticulosis   COLONOSCOPY N/A 05/28/2015   Procedure: COLONOSCOPY;  Surgeon: Suzette Espy, MD;  Location: [facility];  Service: Endoscopy;  Laterality: N/A;  1015   ECTOPIC PREGNANCY SURGERY     ESOPHAGOGASTRODUODENOSCOPY  04/20/2012   [id number] antral gastritis/Erosive reflux esophagitis   LEFT AND RIGHT HEART CATHETERIZATION WITH CORONARY ANGIOGRAM N/A 03/28/2014   Procedure: LEFT AND RIGHT HEART CATHETERIZATION WITH CORONARY ANGIOGRAM;  Surgeon: Peter M Swaziland, MD;  Location: [facility] CATH LAB;  Service: Cardiovascular;  Laterality: N/A;    SPLENECTOMY  07/20/2005    Family History  Problem Relation Age of Onset   Heart failure Mother    Cirrhosis Father        ETOH   Heart disease Brother    Diabetes Daughter    Breast cancer Maternal Aunt    Colon cancer Neg Hx     Social History   Socioeconomic History   Marital status: Single    Spouse name: Not on file   Number of children: 3   Years of education: 12   Highest education level: Some college, no degree  Occupational History   Occupation: disabled    Associate Professor: NOT EMPLOYED  Tobacco Use   Smoking status: Former    Current packs/day: 0.00    Average packs/day: 0.3 packs/day for 12.0 years (3.0 ttl pk-yrs)    Types: Cigarettes    Start date: 09/20/1966    Quit date: 09/20/1978    Years since quitting: 45.4   Smokeless tobacco: Never  Vaping Use   Vaping status: Never Used  Substance and Sexual Activity   Alcohol use: No    Alcohol/week: 0.0 standard drinks of alcohol   Drug use: No   Sexual activity: Not Currently  Other Topics Concern   Not on file  Social History Narrative   Lives Alone       Social Drivers of Health   Financial Resource Strain: Low Risk  (12/07/2023)   [facility] from [facility]   Overall Financial Resource Strain (CARDIA)    Difficulty of Paying Living Expenses: Not hard at all  Food Insecurity: No Food Insecurity (12/12/2023)   Hunger Vital Sign    Worried About Running Out of Food in the Last Year: Never true    Ran Out of Food in the Last Year: Never true  Transportation Needs: No Transportation Needs (12/12/2023)   PRAPARE - Administrator, Civil Service (Medical): No    Lack of Transportation (Non-Medical): No  Physical Activity: Inactive (12/07/2023)   [facility]   Exercise Vital Sign    Days of Exercise per Week: 0 days    Minutes of Exercise per Session: 0 min  Stress: No Stress Concern Present (12/07/2023)   Received from [facility] -  Occupational Stress Questionnaire    Feeling of Stress : Only a little  Social Connections: Moderately Isolated (12/07/2023)   Received from [facility]   Social Connection and Isolation Panel [NHANES]    Frequency of Communication with Friends and Family: Twice a week    Frequency of Social Gatherings with Friends and Family: Twice a week    Attends Religious Services: More than 4 times per year    Active Member of [employer] or Organizations: No    Attends Banker Meetings: Never    Marital Status: Widowed  Intimate Partner Violence: Not At Risk (12/12/2023)  Humiliation, Afraid, Rape, and Kick questionnaire    Fear of Current or Ex-Partner: No    Emotionally Abused: No    Physically Abused: No    Sexually Abused: No    Outpatient Medications Prior to Visit  Medication Sig Dispense Refill   acetaminophen  (TYLENOL ) 500 MG tablet Take 1,000 mg by mouth every 6 (six) hours as needed for moderate pain.     aspirin  81 MG chewable tablet Chew 1 tablet (81 mg total) by mouth daily.     atorvastatin  (LIPITOR ) 80 MG tablet TAKE 1 TABLET EVERY DAY 90 tablet 3   blood glucose meter kit and supplies Dispense based on patient and insurance preference. Use up to four times daily as directed.(FOR E11.40). 1 each 0   calcium  carbonate (OSCAL) 1500 (600 Ca) MG TABS tablet Take 1 tablet by mouth daily with breakfast.     carvedilol  (COREG ) 6.25 MG tablet Take 1 tablet (6.25 mg total) by mouth 2 (two) times daily. 180 tablet 3   Ciclopirox  0.77 % gel APPLY ONE TIME DAILY ON THE AFFECTED TOE NAIL 30 g 0   empagliflozin  (JARDIANCE ) 10 MG TABS tablet Take 1 tablet (10 mg total) by mouth daily before breakfast. 30 tablet 11   furosemide  (LASIX ) 20 MG tablet Take 1-2 tablets (20-40 mg total) by mouth daily. Alternate 40 mg with 20 mg daily 135 tablet 1   glipiZIDE  (GLUCOTROL ) 5 MG tablet Take 1 tablet (5 mg total) by mouth 2 (two) times daily before a meal. 60 tablet 3   Glucagon  (GVOKE HYPOPEN   2-PACK) 1 MG/0.2ML SOAJ Inject 1 mg into the skin as needed (Blood glucose < 53). 0.4 mL 5   Insulin  Pen Needle 31G X 5 MM MISC Use as directed for Lantus  and Novolog . 100 each 5   LANTUS  SOLOSTAR 100 UNIT/ML Solostar Pen INJECT 24 UNITS INTO THE SKIN 2 (TWO) TIMES DAILY. 45 mL 3   levothyroxine  (SYNTHROID ) 50 MCG tablet TAKE 1 TABLET EVERY DAY BEFORE BREAKFAST 90 tablet 3   losartan  (COZAAR ) 25 MG tablet TAKE 1 TABLET EVERY DAY 90 tablet 3   Multiple Vitamin (MULTIVITAMIN) tablet Take 1 tablet by mouth daily.     ondansetron  (ZOFRAN ) 4 MG tablet Take 1 tablet (4 mg total) by mouth every 6 (six) hours. 10 tablet 0   polyethylene glycol (MIRALAX  / GLYCOLAX ) packet Take 17 g by mouth daily as needed for mild constipation.     Semaglutide , 1 MG/DOSE, (OZEMPIC , 1 MG/DOSE,) 4 MG/3ML SOPN INJECT 1MG  UNDER THE SKIN ONE TIME WEEKLY AS DIRECTED 9 mL 1   spironolactone  (ALDACTONE ) 25 MG tablet TAKE 1 TABLET EVERY DAY 90 tablet 3   TRUE METRIX BLOOD GLUCOSE TEST test strip TEST BLOOD SUGAR UP TO FOUR TIMES DAILY AS DIRECTED 400 strip 3   TRUEplus Lancets 33G MISC USE AS DIRECTED TO TEST BLOOD SUGAR UP TO FOUR TIMES DAILY 400 each 3   albuterol  (PROVENTIL ) (2.5 MG/3ML) 0.083% nebulizer solution Take 3 mLs (2.5 mg total) by nebulization every 6 (six) hours as needed for wheezing or shortness of breath. 180 mL 1   promethazine -dextromethorphan  (PROMETHAZINE -DM) 6.25-15 MG/5ML syrup Take 5 mLs by mouth 4 (four) times daily as needed. 118 mL 0   No facility-administered medications prior to visit.    Allergies  Allergen Reactions   Other Other (See Comments)    FLAGYL , CIPRO , PROTONIX  taken at the same time caused patient to lose her breath and have trouble breathing, requiring hospital stay at [facility],  03/23/14-per patient.    Lisinopril  Cough    ROS Review of Systems  Constitutional:  Positive for chills, fatigue and fever.  HENT:  Negative for congestion, postnasal drip, rhinorrhea and sore throat.   Eyes:   Positive for visual disturbance. Negative for pain and discharge.  Respiratory:  Positive for shortness of breath (Exertional). Negative for cough.   Cardiovascular:  Negative for chest pain and palpitations.  Gastrointestinal:  Negative for abdominal pain, diarrhea, nausea and vomiting.  Endocrine: Negative for polydipsia and polyuria.  Genitourinary:  Negative for dysuria and hematuria.  Musculoskeletal:  Positive for arthralgias and back pain. Negative for neck pain and neck stiffness.  Skin:  Negative for rash.  Neurological:  Positive for weakness. Negative for speech difficulty and numbness.  Psychiatric/Behavioral:  Negative for agitation and behavioral problems.       Objective:    Physical Exam Vitals reviewed.  Constitutional:      General: She is not in acute distress.    Appearance: She is not diaphoretic.     Comments: Has a rolling walker  HENT:     Head: Normocephalic and atraumatic.     Nose: No congestion.     Mouth/Throat:     Mouth: Mucous membranes are moist.     Pharynx: No posterior oropharyngeal erythema.  Eyes:     General: No scleral icterus.    Extraocular Movements: Extraocular movements intact.  Cardiovascular:     Rate and Rhythm: Normal rate and regular rhythm.     Pulses: Normal pulses.     Heart sounds: Normal heart sounds. No murmur heard. Pulmonary:     Breath sounds: No wheezing or rales.  Musculoskeletal:     Cervical back: Neck supple. No tenderness.     Right lower leg: No edema.     Left lower leg: No edema.  Skin:    General: Skin is warm.     Findings: No rash.  Neurological:     General: No focal deficit present.     Mental Status: She is alert and oriented to person, place, and time.     Sensory: No sensory deficit.     Motor: Weakness (B/l LE - 3/5) present.  Psychiatric:        Mood and Affect: Mood normal.        Behavior: Behavior normal.     BP 102/64   Pulse (!) 107   Ht 5\' 5"  (1.651 m)   Wt 190 lb (86.2 kg)    SpO2 97%   BMI 31.62 kg/m  Wt Readings from Last 3 Encounters:  02/17/24 190 lb (86.2 kg)  12/28/23 194 lb 9.6 oz (88.3 kg)  12/22/23 195 lb 6.4 oz (88.6 kg)    Lab Results  Component Value Date   TSH 1.740 06/13/2023   Lab Results  Component Value Date   WBC 5.7 06/13/2023   HGB 11.3 06/13/2023   HCT 36.2 06/13/2023   MCV 90 06/13/2023   PLT 150 06/13/2023   Lab Results  Component Value Date   NA 133 (L) 06/13/2023   K 4.7 06/13/2023   CO2 23 06/13/2023   GLUCOSE 394 (H) 06/13/2023   BUN 15 06/13/2023   CREATININE 0.86 06/13/2023   BILITOT 0.5 06/13/2023   ALKPHOS 82 06/13/2023   AST 17 06/13/2023   ALT 19 06/13/2023   PROT 7.6 06/13/2023   ALBUMIN 3.7 (L) 06/13/2023   CALCIUM  9.7 06/13/2023   ANIONGAP 7 10/13/2022   EGFR 71 06/13/2023  Lab Results  Component Value Date   CHOL 120 06/13/2023   Lab Results  Component Value Date   HDL 37 (L) 06/13/2023   Lab Results  Component Value Date   LDLCALC 58 06/13/2023   Lab Results  Component Value Date   TRIG 145 06/13/2023   Lab Results  Component Value Date   CHOLHDL 3.2 06/13/2023   Lab Results  Component Value Date   HGBA1C 6.8 12/07/2023      Assessment & Plan:   Problem List Items Addressed This Visit       Cardiovascular and Mediastinum   HTN (hypertension)   BP Readings from Last 1 Encounters:  02/17/24 102/64   Well-controlled with Losartan , Coreg , Spironolactone  and Lasix  Advised to hold Lasix  for now due to hypotension in the setting of viral infection Counseled for compliance with the medications Advised DASH diet and moderate exercise/walking as tolerated      Relevant Orders   CMP14+EGFR   CBC   Chronic combined systolic and diastolic CHF (congestive heart failure) (HCC)   On ARB, Spironolactone , Jardiance  and Lasix  Appears euvolemic currently Followed by cardiology        Endocrine   Hypothyroidism   Lab Results  Component Value Date   TSH 1.740 06/13/2023     On Levothyroxine  50 mcg QD Advised for compliance Follow up TSH and free T4      Relevant Orders   TSH + free T4   Type 2 diabetes mellitus with hyperglycemia, with long-term current use of insulin  (HCC) - Primary   Lab Results  Component Value Date   HGBA1C 6.8 12/07/2023   Well-controlled - improved now from >15.5 in 09/24 On Lantus  20 U QD, Ozempic  1 mg qw, Jardiance  10 mg QD and Glipizide  5 mg BID Advised to continue Lantus  20 U once daily for now Had referred to endocrinology, but did not follow up Advised to follow diabetic diet On statin F/u CMP and lipid panel Diabetic eye exam: Advised to follow up with Ophthalmology for diabetic eye exam  Avoid skipping any meal. Advised to keep snacks or orange juice accessible most of the time. Gvoke hypopen  for hypoglycemia.      Relevant Orders   Microalbumin / creatinine urine ratio   CMP14+EGFR   Hemoglobin A1c     Other   Mixed hyperlipidemia   On Lipitor  80 mg once daily Check lipid profile      Relevant Orders   Lipid Profile   Dyspnea   Her dyspnea is mostly due to CHF Likely has some component of reactive airway disease She had some improvement in her dyspnea with nebulization during hospital stay Prescribed albuterol  neb and nebulizer machine      Relevant Medications   albuterol  (PROVENTIL ) (2.5 MG/3ML) 0.083% nebulizer solution   Other Visit Diagnoses       Breast cancer screening by mammogram       Relevant Orders   MM 3D SCREENING MAMMOGRAM BILATERAL BREAST     Post-menopausal       Relevant Orders   DG Bone Density     Encounter for immunization       Relevant Orders   Pneumococcal conjugate vaccine 20-valent (Completed)          Meds ordered this encounter  Medications   albuterol  (PROVENTIL ) (2.5 MG/3ML) 0.083% nebulizer solution    Sig: Take 3 mLs (2.5 mg total) by nebulization every 6 (six) hours as needed for wheezing or shortness of breath.  Dispense:  360 mL    Refill:  1     This prescription was filled on 01/02/2024. Any refills authorized will be placed on file.     Follow-up: Return in about 4 months (around 06/19/2024) for DM.    Meldon Sport, MD

## 2024-02-17 NOTE — Telephone Encounter (Signed)
 Copied from CRM [id number]. Topic: Clinical - Lab/Test Results >> Feb 17, 2024 12:40 PM Corean Deutscher wrote: Reason for CRM: Patient son Vaughn Georges called regarding the sleep study results for the patient, and a cpap machine. Vaughn Georges would like a callback at [phone]. Vaughn Georges stated the patient has problems sleeping at night and he would like to get an cpap ordered for her. >> Feb 17, 2024  1:25 PM CMA Tyrell Gallo wrote: Left pt son a message to notify him we have not seen his mom since 01/2022 and she is going to need an appt

## 2024-02-17 NOTE — Assessment & Plan Note (Signed)
 On Lipitor 80 mg once daily Check lipid profile

## 2024-02-17 NOTE — Assessment & Plan Note (Signed)
 Lab Results  Component Value Date   TSH 1.740 06/13/2023    On Levothyroxine 50 mcg QD Advised for compliance Follow up TSH and free T4

## 2024-02-17 NOTE — Assessment & Plan Note (Signed)
 BP Readings from Last 1 Encounters:  02/17/24 102/64   Well-controlled with Losartan , Coreg , Spironolactone  and Lasix  Advised to hold Lasix  for now due to hypotension in the setting of viral infection Counseled for compliance with the medications Advised DASH diet and moderate exercise/walking as tolerated

## 2024-02-18 LAB — HEMOGLOBIN A1C
Est. average glucose Bld gHb Est-mCnc: 143 mg/dL
Hgb A1c MFr Bld: 6.6 % — ABNORMAL HIGH (ref 4.8–5.6)

## 2024-02-18 LAB — CMP14+EGFR
ALT: 15 IU/L (ref 0–32)
AST: 15 IU/L (ref 0–40)
Albumin: 4.1 g/dL (ref 3.8–4.8)
Alkaline Phosphatase: 75 IU/L (ref 44–121)
BUN/Creatinine Ratio: 17 (ref 12–28)
BUN: 20 mg/dL (ref 8–27)
Bilirubin Total: 0.5 mg/dL (ref 0.0–1.2)
CO2: 20 mmol/L (ref 20–29)
Calcium: 9.5 mg/dL (ref 8.7–10.3)
Chloride: 100 mmol/L (ref 96–106)
Creatinine, Ser: 1.15 mg/dL — ABNORMAL HIGH (ref 0.57–1.00)
Globulin, Total: 3.4 g/dL (ref 1.5–4.5)
Glucose: 108 mg/dL — ABNORMAL HIGH (ref 70–99)
Potassium: 4.9 mmol/L (ref 3.5–5.2)
Sodium: 138 mmol/L (ref 134–144)
Total Protein: 7.5 g/dL (ref 6.0–8.5)
eGFR: 50 mL/min/{1.73_m2} — ABNORMAL LOW (ref >59)

## 2024-02-18 LAB — CBC
Hematocrit: 36.1 % (ref 34.0–46.6)
Hemoglobin: 11.4 g/dL (ref 11.1–15.9)
MCH: 29.4 pg (ref 26.6–33.0)
MCHC: 31.6 g/dL (ref 31.5–35.7)
MCV: 93 fL (ref 79–97)
Platelets: 204 10*3/uL (ref 150–450)
RBC: 3.88 x10E6/uL (ref 3.77–5.28)
RDW: 16.1 % — ABNORMAL HIGH (ref 11.7–15.4)
WBC: 4.9 10*3/uL (ref 3.4–10.8)

## 2024-02-18 LAB — LIPID PANEL
Chol/HDL Ratio: 2.3 ratio (ref 0.0–4.4)
Cholesterol, Total: 108 mg/dL (ref 100–199)
HDL: 46 mg/dL (ref >39)
LDL Chol Calc (NIH): 45 mg/dL (ref 0–99)
Triglycerides: 89 mg/dL (ref 0–149)
VLDL Cholesterol Cal: 17 mg/dL (ref 5–40)

## 2024-02-18 LAB — TSH+FREE T4
Free T4: 1.17 ng/dL (ref 0.82–1.77)
TSH: 1.79 u[IU]/mL (ref 0.450–4.500)

## 2024-02-19 LAB — MICROALBUMIN / CREATININE URINE RATIO
Creatinine, Urine: 125.8 mg/dL
Microalb/Creat Ratio: 6 mg/g{creat} (ref 0–29)
Microalbumin, Urine: 7.1 ug/mL

## 2024-02-20 ENCOUNTER — Ambulatory Visit: Attending: Cardiovascular Disease

## 2024-02-20 DIAGNOSIS — I5022 Chronic systolic (congestive) heart failure: Secondary | ICD-10-CM

## 2024-02-20 DIAGNOSIS — Z95810 Presence of automatic (implantable) cardiac defibrillator: Secondary | ICD-10-CM | POA: Diagnosis not present

## 2024-02-21 ENCOUNTER — Encounter

## 2024-02-22 NOTE — Progress Notes (Signed)
 EPIC Encounter for ICM Monitoring  Patient Name: [name] is a 73 y.o. female Date: 02/22/2024 Primary Care Physican: Meldon Sport, MD Primary Cardiologist: Branch Electrophysiologist: Mealor Bi-V Pacing:  98%        11/02/2023 Weight: 189 lbs 12/07/2023 Weight: 197 lbs (admitted to hospital) 12/13/2023 Weight: 195 lbs  12/18/2023 Weight: 191 lbs 12/19/2023 Weigh: 194 lbs 12/26/2023 Weight: 191 lbs 01/11/2024 Weight: 190 lbs 02/22/2024 Weight: 190 lbs                                                        Spoke with patient and heart failure questions reviewed.  Transmission results reviewed.  Pt asymptomatic for fluid accumulation.  Reports feeling well at this time and voices no complaints.     Corvue Thoracic impedance suggesting possible fluid accumulation from 5/6-5/15 & 5/20-5/27.  Suggesting normal fluid levels since 5/28.   Prescribed:  Furosemide  20 mg take 1 tablet by mouth every other day alternating with 2 tablets (40 mg total) every other day.  Spironolactone  25 mg take 1 tablet daily.   Labs: 02/17/2024 Creatinine 1.15, BUN 20, Potassium 4.9, Sodium 138, GFR 50 12/11/2023 Creatinine 1.03, BUN 35, Potassium 4.5, Sodium 136, GFR 57 12/10/2023 Creatinine 1.1,   BUN 36, Potassium 4.3, Sodium 138, GFR 53 12/09/2023 Creatinine 1.17, BUN 40, Potassium 4.8, Sodium 138, GFR 49 12/08/2023 Creatinine 1.3,   BUN 36, Potassium 4.6, Sodium 136, GFR 43 12/07/2023 Creatinine 1.19, BUN 26, Potassium 4.9, Sodium 141, GFR 48 12/03/2023 Creatinine 1.33, BUN 27, Potassium 4.2, Sodium 135, GFR 42  A complete set of results can be found in Results Review.   Recommendations:  No changes and encouraged to call if experiencing any fluid symptoms.   Follow-up plan: ICM clinic phone appointment on 04/16/2024.    91 day device clinic remote transmission 05/07/2024.        EP/Cardiology Office Visits:  Advised to call Dr Junita Oliva office to reschedule missed appointment.  Missed 01/23/2024 appt with  Theotis Flake, PA.    Recall 10/29/2024 with Dr Arlester Ladd.   Copy of ICM check sent to Dr. Arlester Ladd.  3 month ICM trend: 02/22/2024.    12-14 Month ICM trend:     Almyra Jain, RN 02/22/2024 9:23 AM

## 2024-02-27 ENCOUNTER — Ambulatory Visit: Payer: Self-pay | Admitting: Internal Medicine

## 2024-03-02 ENCOUNTER — Other Ambulatory Visit: Payer: Self-pay | Admitting: Cardiology

## 2024-03-21 ENCOUNTER — Ambulatory Visit

## 2024-03-21 VITALS — Ht 65.0 in | Wt 190.0 lb

## 2024-03-21 DIAGNOSIS — H9193 Unspecified hearing loss, bilateral: Secondary | ICD-10-CM

## 2024-03-21 DIAGNOSIS — I5042 Chronic combined systolic (congestive) and diastolic (congestive) heart failure: Secondary | ICD-10-CM | POA: Diagnosis not present

## 2024-03-21 DIAGNOSIS — Z780 Asymptomatic menopausal state: Secondary | ICD-10-CM | POA: Diagnosis not present

## 2024-03-21 DIAGNOSIS — I255 Ischemic cardiomyopathy: Secondary | ICD-10-CM | POA: Diagnosis not present

## 2024-03-21 DIAGNOSIS — Z1231 Encounter for screening mammogram for malignant neoplasm of breast: Secondary | ICD-10-CM | POA: Diagnosis not present

## 2024-03-21 DIAGNOSIS — G4733 Obstructive sleep apnea (adult) (pediatric): Secondary | ICD-10-CM | POA: Diagnosis not present

## 2024-03-21 DIAGNOSIS — Z0000 Encounter for general adult medical examination without abnormal findings: Secondary | ICD-10-CM

## 2024-03-21 DIAGNOSIS — I420 Dilated cardiomyopathy: Secondary | ICD-10-CM

## 2024-03-21 NOTE — Addendum Note (Signed)
 Addended by: ALLYSON PROFFER A on: 03/21/2024 05:08 PM   Modules accepted: Orders

## 2024-03-21 NOTE — Progress Notes (Signed)
 Subjective:   [name] is a 73 y.o. who presents for a Medicare Wellness preventive visit.  As a reminder, Annual Wellness Visits don't include a physical exam, and some assessments may be limited, especially if this visit is performed virtually. We may recommend an in-person follow-up visit with your provider if needed.  Visit Complete: Virtual I connected with  [name] on 03/21/24 by a audio enabled telemedicine application and verified that I am speaking with the correct person using two identifiers.  Patient Location: Home  Provider Location: Home Office  I discussed the limitations of evaluation and management by telemedicine. The patient expressed understanding and agreed to proceed.  Vital Signs: Because this visit was a virtual/telehealth visit, some criteria may be missing or patient reported. Any vitals not documented were not able to be obtained and vitals that have been documented are patient reported.  VideoDeclined- This patient declined Librarian, academic. Therefore the visit was completed with audio only.  Persons Participating in Visit: Patient.  AWV Questionnaire: No: Patient Medicare AWV questionnaire was not completed prior to this visit.  Cardiac Risk Factors include: advanced age (>37men, >6 women);diabetes mellitus;dyslipidemia;hypertension;obesity (BMI >30kg/m2);Other (see comment), Risk factor comments: CHF     Objective:    Today's Vitals   03/21/24 1520 03/21/24 1522  Weight: 190 lb (86.2 kg)   Height: 5' 5 (1.651 m)   PainSc: 0-No pain 0-No pain   Body mass index is 31.62 kg/m.     03/21/2024    3:17 PM 12/06/2022   10:48 AM 10/13/2022    9:26 AM 09/07/2021    2:35 PM 07/22/2021    9:30 AM 07/15/2021   11:28 AM 08/28/2020    8:40 AM  Advanced Directives  Does Patient Have a Medical Advance Directive? No No No No No No No  Would patient like information on creating a medical advance directive? Yes  (MAU/Ambulatory/Procedural Areas - Information given) No - Patient declined No - Patient declined Yes (ED - Information included in AVS) No - Patient declined No - Patient declined Yes (MAU/Ambulatory/Procedural Areas - Information given)    Current Medications (verified) Outpatient Encounter Medications as of 03/21/2024  Medication Sig   acetaminophen  (TYLENOL ) 500 MG tablet Take 1,000 mg by mouth every 6 (six) hours as needed for moderate pain.   albuterol  (PROVENTIL ) (2.5 MG/3ML) 0.083% nebulizer solution Take 3 mLs (2.5 mg total) by nebulization every 6 (six) hours as needed for wheezing or shortness of breath.   aspirin  81 MG chewable tablet Chew 1 tablet (81 mg total) by mouth daily.   atorvastatin  (LIPITOR ) 80 MG tablet TAKE 1 TABLET EVERY DAY   blood glucose meter kit and supplies Dispense based on patient and insurance preference. Use up to four times daily as directed.(FOR E11.40).   calcium  carbonate (OSCAL) 1500 (600 Ca) MG TABS tablet Take 1 tablet by mouth daily with breakfast.   carvedilol  (COREG ) 6.25 MG tablet Take 1 tablet (6.25 mg total) by mouth 2 (two) times daily.   Ciclopirox  0.77 % gel APPLY ONE TIME DAILY ON THE AFFECTED TOE NAIL   empagliflozin  (JARDIANCE ) 10 MG TABS tablet Take 1 tablet (10 mg total) by mouth daily before breakfast.   furosemide  (LASIX ) 20 MG tablet Take 1-2 tablets (20-40 mg total) by mouth daily. Alternate 40 mg with 20 mg daily   glipiZIDE  (GLUCOTROL ) 5 MG tablet Take 1 tablet (5 mg total) by mouth 2 (two) times daily before a meal.  Glucagon  (GVOKE HYPOPEN  2-PACK) 1 MG/0.2ML SOAJ Inject 1 mg into the skin as needed (Blood glucose < 53).   Insulin  Pen Needle 31G X 5 MM MISC Use as directed for Lantus  and Novolog .   LANTUS  SOLOSTAR 100 UNIT/ML Solostar Pen INJECT 24 UNITS INTO THE SKIN 2 (TWO) TIMES DAILY.   levothyroxine  (SYNTHROID ) 50 MCG tablet TAKE 1 TABLET EVERY DAY BEFORE BREAKFAST   losartan  (COZAAR ) 25 MG tablet TAKE 1 TABLET EVERY DAY    Multiple Vitamin (MULTIVITAMIN) tablet Take 1 tablet by mouth daily.   ondansetron  (ZOFRAN ) 4 MG tablet Take 1 tablet (4 mg total) by mouth every 6 (six) hours.   polyethylene glycol (MIRALAX  / GLYCOLAX ) packet Take 17 g by mouth daily as needed for mild constipation.   Semaglutide , 1 MG/DOSE, (OZEMPIC , 1 MG/DOSE,) 4 MG/3ML SOPN INJECT 1MG  UNDER THE SKIN ONE TIME WEEKLY AS DIRECTED   spironolactone  (ALDACTONE ) 25 MG tablet TAKE 1 TABLET EVERY DAY   TRUE METRIX BLOOD GLUCOSE TEST test strip TEST BLOOD SUGAR UP TO FOUR TIMES DAILY AS DIRECTED   TRUEplus Lancets 33G MISC USE AS DIRECTED TO TEST BLOOD SUGAR UP TO FOUR TIMES DAILY   No facility-administered encounter medications on file as of 03/21/2024.    Allergies (verified) Other and Lisinopril    History: Past Medical History:  Diagnosis Date   Anemia    Arthritis    CAD (coronary artery disease)    a. LHC (03/2014) Lmain: nl, LAD: diff dz proximal 20%, 30-40% dz mid vessel prior 2nd diagonal, LCx: 40% in OM1, 20-30% in OM2, RCA: 99% subtotal occlusion @ crux, TIMI 1 flow, L-R collaterals to distal vessel   Cardiomyopathy (HCC) 03/28/2014   LV dysfunction out of proportion to CAD 03/28/14   CHF (congestive heart failure) (HCC)    Phreesia 10/03/2020   Chronic systolic heart failure (HCC)    a. ECHO (03/2014): EF 25-30%, akinesis enteroanteroseptal myocardium, grade III DD b. RHC (03/2014) RA 4, RV 42/4, PA 44/14 (26), PCWP 21, PA 62% Fick CO/CI 6.9 / 3.4   Diabetes mellitus    Diabetes mellitus without complication (HCC)    Phreesia 10/03/2020   Duodenal ulcer    Age 59   Dyspnea    Erosive esophagitis    Gastritis    Hypertension    Hypothyroidism    Obese    Short-term memory loss    Thyroid  disease    TTP (thrombotic thrombocytopenic purpura) (HCC) 06/2005   Past Surgical History:  Procedure Laterality Date   BIV ICD INSERTION CRT-D N/A 05/03/2019   Procedure: BIV ICD INSERTION CRT-D;  Surgeon: Kelsie Agent, MD;  Location: [facility]  INVASIVE CV LAB;  Service: Cardiovascular;  Laterality: N/A;   CARDIAC CATHETERIZATION     COLONOSCOPY  08/29/2012   MFM:Rnonwpr diverticulosis   COLONOSCOPY N/A 05/28/2015   Procedure: COLONOSCOPY;  Surgeon: Lamar CHRISTELLA Hollingshead, MD;  Location: [facility] END[facility];  Service: Endoscopy;  Laterality: N/A;  1015   ECTOPIC PREGNANCY SURGERY     ESOPHAGOGASTRODUODENOSCOPY  04/20/2012   WLM:Zmndpcz antral gastritis/Erosive reflux esophagitis   LEFT AND RIGHT HEART CATHETERIZATION WITH CORONARY ANGIOGRAM N/A 03/28/2014   Procedure: LEFT AND RIGHT HEART CATHETERIZATION WITH CORONARY ANGIOGRAM;  Surgeon: Peter M Swaziland, MD;  Location: [facility] CATH LAB;  Service: Cardiovascular;  Laterality: N/A;   SPLENECTOMY  07/20/2005   Family History  Problem Relation Age of Onset   Heart failure Mother    Cirrhosis Father        ETOH  Heart disease Brother    Diabetes Daughter    Breast cancer Maternal Aunt    Colon cancer Neg Hx    Social History   Socioeconomic History   Marital status: Single    Spouse name: Not on file   Number of children: 3   Years of education: 12   Highest education level: Some college, no degree  Occupational History   Occupation: disabled    Associate Professor: NOT EMPLOYED  Tobacco Use   Smoking status: Former    Current packs/day: 0.00    Average packs/day: 0.3 packs/day for 12.0 years (3.0 ttl pk-yrs)    Types: Cigarettes    Start date: 09/20/1966    Quit date: 09/20/1978    Years since quitting: 45.5   Smokeless tobacco: Never  Vaping Use   Vaping status: Never Used  Substance and Sexual Activity   Alcohol use: No    Alcohol/week: 0.0 standard drinks of alcohol   Drug use: No   Sexual activity: Not Currently  Other Topics Concern   Not on file  Social History Narrative   Lives Alone       Social Drivers of Health   Financial Resource Strain: Low Risk  (03/21/2024)   Overall Financial Resource Strain (CARDIA)    Difficulty of Paying Living Expenses: Not hard at all  Food  Insecurity: No Food Insecurity (03/21/2024)   Hunger Vital Sign    Worried About Running Out of Food in the Last Year: Never true    Ran Out of Food in the Last Year: Never true  Transportation Needs: No Transportation Needs (03/21/2024)   PRAPARE - Administrator, Civil Service (Medical): No    Lack of Transportation (Non-Medical): No  Physical Activity: Sufficiently Active (03/21/2024)   Exercise Vital Sign    Days of Exercise per Week: 7 days    Minutes of Exercise per Session: 30 min  Stress: No Stress Concern Present (03/21/2024)   [employer] of Occupational Health - Occupational Stress Questionnaire    Feeling of Stress: Not at all  Social Connections: Moderately Integrated (03/21/2024)   Social Connection and Isolation Panel    Frequency of Communication with Friends and Family: More than three times a week    Frequency of Social Gatherings with Friends and Family: More than three times a week    Attends Religious Services: More than 4 times per year    Active Member of [employer] or Organizations: Yes    Attends Engineer, structural: More than 4 times per year    Marital Status: Divorced    Tobacco Counseling Counseling given: Yes    Clinical Intake:  Pre-visit preparation completed: Yes  Pain : No/denies pain Pain Score: 0-No pain     BMI - recorded: 31.62 Nutritional Status: BMI > 30  Obese Nutritional Risks: None Diabetes: Yes CBG done?: No (telehealth visit. unable to obtain) Did pt. bring in CBG monitor from home?: No  Lab Results  Component Value Date   HGBA1C 6.6 (H) 02/17/2024   HGBA1C 6.8 12/07/2023   HGBA1C >15.5 (H) 06/13/2023     How often do you need to have someone help you when you read instructions, pamphlets, or other written materials from your doctor or pharmacy?: 1 - Never  Interpreter Needed?: No  Information entered by :: Stefano ORN CMA   Activities of Daily Living     03/21/2024    3:38 PM  In your present state of  health, do you  have any difficulty performing the following activities:  Hearing? 1  Vision? 0  Difficulty concentrating or making decisions? 0  Walking or climbing stairs? 1  Dressing or bathing? 0  Doing errands, shopping? 1  Preparing Food and eating ? Y  Using the Toilet? N  In the past six months, have you accidently leaked urine? N  Do you have problems with loss of bowel control? N  Managing your Medications? Y  Managing your Finances? N  Housekeeping or managing your Housekeeping? Y    Patient Care Team: Tobie Suzzane POUR, MD as PCP - General (Internal Medicine) Shaaron Lamar HERO, MD as Attending Physician (Gastroenterology) Mealor, Eulas BRAVO, MD as Consulting Physician (Cardiology) Alvan Dorn FALCON, MD as Consulting Physician (Cardiology)  I have updated your Care Teams any recent Medical Services you may have received from other providers in the past year.     Assessment:   This is a routine wellness examination for [place].  Hearing/Vision screen Hearing Screening - Comments:: Patient c/o hearing difficulties. Declines referral today.  Vision Screening - Comments:: Wears rx glasses - up to date with routine eye exams with  Oneil Kawasaki w/ [facility] [place]   Goals Addressed             This Visit's Progress    Patient Stated       Increase mobility       Depression Screen     03/21/2024    3:41 PM 02/17/2024   11:03 AM 12/13/2023   10:50 AM 08/04/2023   11:36 AM 06/22/2023   11:40 AM 06/13/2023   10:50 AM 01/31/2023    2:51 PM  PHQ 2/9 Scores  PHQ - 2 Score 0 0 0 0 0 0 0  PHQ- 9 Score 0 0 0        Fall Risk     03/21/2024    3:35 PM 02/17/2024   11:03 AM 12/13/2023   10:50 AM 11/17/2023   11:38 AM 08/04/2023   11:36 AM  Fall Risk   Falls in the past year? 0 0 0 0 0  Number falls in past yr: 0 0 0 0 0  Injury with Fall? 0 0 0 0 0  Risk for fall due to : No Fall Risks No Fall Risks No Fall Risks Impaired balance/gait;Impaired mobility  No Fall Risks  Follow up Falls evaluation completed;Education provided;Falls prevention discussed Falls evaluation completed  Falls evaluation completed Falls evaluation completed    MEDICARE RISK AT HOME:  Medicare Risk at Home Any stairs in or around the home?: No If so, are there any without handrails?: No Home free of loose throw rugs in walkways, pet beds, electrical cords, etc?: Yes Adequate lighting in your home to reduce risk of falls?: Yes Life alert?: No Use of a cane, walker or w/c?: Yes Grab bars in the bathroom?: Yes Shower chair or bench in shower?: Yes Elevated toilet seat or a handicapped toilet?: Yes  TIMED UP AND GO:  Was the test performed?  No  Cognitive Function: 6CIT completed    12/06/2022   10:50 AM 08/28/2020    8:42 AM  MMSE - Mini Mental State Exam  Not completed: Unable to complete Unable to complete        03/21/2024    3:40 PM 12/06/2022   10:50 AM 09/07/2021    2:39 PM 08/28/2020    8:42 AM  6CIT Screen  What Year? 0 points 0 points 0 points 0  points  What month? 0 points 0 points 0 points 0 points  What time? 0 points 0 points 0 points 0 points  Count back from 20 0 points 0 points 0 points 0 points  Months in reverse 0 points 0 points 0 points 0 points  Repeat phrase 0 points 0 points 0 points 0 points  Total Score 0 points 0 points 0 points 0 points    Immunizations Immunization History  Administered Date(s) Administered   Fluad Quad(high Dose 65+) 06/19/2020, 11/02/2022   Fluad Trivalent(High Dose 65+) 06/13/2023   Influenza,inj,Quad PF,6+ Mos 06/22/2017   Influenza-Unspecified 06/20/2014, 07/21/2018   Moderna Sars-Covid-2 Vaccination 12/01/2019, 01/02/2020   PNEUMOCOCCAL CONJUGATE-20 02/17/2024   Pneumococcal Polysaccharide-23 04/19/2012    Screening Tests Health Maintenance  Topic Date Due   DTaP/Tdap/Td (1 - Tdap) Never done   Zoster Vaccines- Shingrix (1 of 2) Never done   COVID-19 Vaccine (3 - Moderna risk series)  01/30/2020   MAMMOGRAM  03/14/2021   OPHTHALMOLOGY EXAM  02/17/2022   DEXA SCAN  05/23/2023   FOOT EXAM  11/03/2023   INFLUENZA VACCINE  04/20/2024   HEMOGLOBIN A1C  08/19/2024   Diabetic kidney evaluation - eGFR measurement  02/16/2025   Diabetic kidney evaluation - Urine ACR  02/16/2025   Medicare Annual Wellness (AWV)  03/21/2025   Colonoscopy  05/27/2025   Pneumococcal Vaccine: 50+ Years  Completed   Hepatitis C Screening  Completed   Hepatitis B Vaccines  Aged Out   HPV VACCINES  Aged Out   Meningococcal B Vaccine  Aged Out    Health Maintenance  Health Maintenance Due  Topic Date Due   DTaP/Tdap/Td (1 - Tdap) Never done   Zoster Vaccines- Shingrix (1 of 2) Never done   COVID-19 Vaccine (3 - Moderna risk series) 01/30/2020   MAMMOGRAM  03/14/2021   OPHTHALMOLOGY EXAM  02/17/2022   DEXA SCAN  05/23/2023   FOOT EXAM  11/03/2023   Health Maintenance Items Addressed: Mammogram scheduled, DEXA scheduled Audiology referral placed. Dental resources provided.   Additional Screening:  Vision Screening: Recommended annual ophthalmology exams for early detection of glaucoma and other disorders of the eye. Would you like a referral to an eye doctor? No    Dental Screening: Recommended annual dental exams for proper oral hygiene  Community Resource Referral / Chronic Care Management: CRR required this visit?  Yes   CCM required this visit?  No   Plan:    I have personally reviewed and noted the following in the patient's chart:   Medical and social history Use of alcohol, tobacco or illicit drugs  Current medications and supplements including opioid prescriptions. Patient is not currently taking opioid prescriptions. Functional ability and status Nutritional status Physical activity Advanced directives List of other physicians Hospitalizations, surgeries, and ER visits in previous 12 months Vitals Screenings to include cognitive, depression, and falls Referrals  and appointments  In addition, I have reviewed and discussed with patient certain preventive protocols, quality metrics, and best practice recommendations. A written personalized care plan for preventive services as well as general preventive health recommendations were provided to patient.   Jahmai Finelli, CMA   03/21/2024   After Visit Summary: (Mail) Due to this being a telephonic visit, the after visit summary with patients personalized plan was offered to patient via mail   Notes: Please refer to Routing Comments.

## 2024-03-21 NOTE — Patient Instructions (Signed)
 Ms. Langhorst ,  Thank you for taking time out of your busy schedule to complete your Annual Wellness Visit with me. I enjoyed our conversation and look forward to speaking with you again next year. I, as well as your care team,  appreciate your ongoing commitment to your health goals. Please review the following plan we discussed and let me know if I can assist you in the future.  I enjoyed our conversation and look forward to it again next year. Blessing for the upcoming year!!  -Colson Barco  Your Game plan/ To Do List   Referrals Placed:  Mammogram and Bone Density Appointments at [facility]: Mammogram: April 04, 2024 at 8:00 am  Bone Density: April 04, 2024 at 8:30 am STOP YOUR CALCIUM  ON April 01, 2024. DO NOT TAKE IT ON JULY 14TH OR April 03 2024. YOU MAY START IT BACK AFTER YOUR SCAN.   Audiologist/Hearing Test [facility] Address: [address] [place], [place] [address] Phone: [phone]   Follow up Visits:  PCP IN OFFICE: March 26, 2024 at 2:00 pm acute visit for breast pain Sept 30, 2025 at 10:20 am 1 YEAR WITH HEALTH ADVISOR: March 25, 2024   Clinician Recommendations:  Aim for 30 minutes of exercise or brisk walking, 6-8 glasses of water , and 5 servings of fruits and vegetables each day.       This is a list of the screening recommended for you and due dates:  Health Maintenance  Topic Date Due   DTaP/Tdap/Td vaccine (1 - Tdap) Never done   Zoster (Shingles) Vaccine (1 of 2) Never done   COVID-19 Vaccine (3 - Moderna risk series) 01/30/2020   Mammogram  03/14/2021   Eye exam for diabetics  02/17/2022   DEXA scan (bone density measurement)  05/23/2023   Complete foot exam   11/03/2023   Medicare Annual Wellness Visit  12/06/2023   Flu Shot  04/20/2024   Hemoglobin A1C  08/19/2024   Yearly kidney function blood test for diabetes  02/16/2025   Yearly kidney health urinalysis for diabetes  02/16/2025   Colon Cancer Screening  05/27/2025   Pneumococcal Vaccine for age over 49   Completed   Hepatitis C Screening  Completed   Hepatitis B Vaccine  Aged Out   HPV Vaccine  Aged Out   Meningitis B Vaccine  Aged Out    Advanced directives: (Provided) Advance directive discussed with you today. I have provided a copy for you to complete at home and have notarized. Once this is complete, please bring a copy in to our office so we can scan it into your chart.  Advance Care Planning is important because it:  [x]  Makes sure you receive the medical care that is consistent with your values, goals, and preferences  [x]  It provides guidance to your family and loved ones and reduces their decisional burden about whether or not they are making the right decisions based on your wishes.  Follow the link provided in your after visit summary or read over the paperwork we have mailed to you to help you started getting your Advance Directives in place. If you need assistance in completing these, please reach out to us  so that we can help you!

## 2024-03-22 ENCOUNTER — Telehealth: Payer: Self-pay

## 2024-03-22 NOTE — Progress Notes (Signed)
 Complex Care Management Note  Care Guide Note 03/22/2024 Name: [name] MRN: [id number] DOB: [birth year]  [name] is a 73 y.o. year old female who sees Tobie Suzzane POUR, MD for primary care. I reached out to [name] by phone today to offer complex care management services.  [name] was given information about Complex Care Management services today including:   The Complex Care Management services include support from the care team which includes your Nurse Care Manager, Clinical Social Worker, or Pharmacist.  The Complex Care Management team is here to help remove barriers to the health concerns and goals most important to you. Complex Care Management services are voluntary, and the patient may decline or stop services at any time by request to their care team member.   Complex Care Management Consent Status: Patient agreed to services and verbal consent obtained.   Follow up plan:  Telephone appointment with complex care management team member scheduled for:  04/09/2024  Encounter Outcome:  Patient Scheduled  Jeoffrey Buffalo , RMA     [place]  [facility], [facility] Guide  Direct Dial: [phone]  Website: [email or web address]

## 2024-03-27 NOTE — Progress Notes (Signed)
 Remote ICD transmission.

## 2024-03-27 NOTE — Addendum Note (Signed)
 Addended by: TAWNI DRILLING D on: 03/27/2024 05:06 PM   Modules accepted: Orders

## 2024-04-04 ENCOUNTER — Other Ambulatory Visit (HOSPITAL_COMMUNITY)

## 2024-04-04 ENCOUNTER — Ambulatory Visit (HOSPITAL_COMMUNITY)

## 2024-04-09 ENCOUNTER — Other Ambulatory Visit: Payer: Self-pay | Admitting: *Deleted

## 2024-04-09 NOTE — Patient Instructions (Signed)
 Visit Information  Thank you for taking time to visit with me today. Please don't hesitate to contact me if I can be of assistance to you before our next scheduled appointment.  Our next appointment is by telephone on 04-23-2024 at 10:30 am Please call the care guide team at [phone] if you need to cancel or reschedule your appointment.   Following is a copy of your care plan:   Goals Addressed             This Visit's Progress    VBCI RN Care Plan: DM       Problems:  Chronic Disease Management support and education needs related to DMII  Goal: Over the next 90 days the Patient will attend all scheduled medical appointments: with primary care provider and specialist as evidenced by keeping all scheduled appointments        continue to work with RN Care Manager and/or Social Worker to address care management and care coordination needs related to DMII as evidenced by adherence to care management team scheduled appointments     take all medications exactly as prescribed and will call provider for medication related questions as evidenced by compliance with all medications    verbalize basic understanding of DMII disease process and self health management plan as evidenced by verbal explanation, recognizing/monitoring symptoms, lifestyle modifications  Interventions:   Diabetes Interventions: Assessed patient's understanding of A1c goal: <7% Provided education to patient about basic DM disease process Reviewed medications with patient and discussed importance of medication adherence Counseled on importance of regular laboratory monitoring as prescribed Discussed plans with patient for ongoing care management follow up and provided patient with direct contact information for care management team Provided patient with written educational materials related to hypo and hyperglycemia and importance of correct treatment Reviewed scheduled/upcoming provider appointments including:  06-19-2024 Advised patient, providing education and rationale, to check cbg before meals and bedtime and record, calling provider for findings outside established parameters Review of patient status, including review of consultants reports, relevant laboratory and other test results, and medications completed Screening for signs and symptoms of depression related to chronic disease state  Assessed social determinant of health barriers Lab Results  Component Value Date   HGBA1C 6.6 (H) 02/17/2024    Patient Self-Care Activities:  Attend all scheduled provider appointments Call provider office for new concerns or questions  Take medications as prescribed   schedule appointment with eye doctor check blood sugar at prescribed times: before meals and at bedtime limit fast food meals to no more than 1 per week manage portion size switch to low-fat or skim milk switch to sugar-free drinks  Plan:  Telephone follow up appointment with care management team member scheduled for:  04-23-2024 at 10:30 am          VBCI RN Care Plan: HF       Problems:  Chronic Disease Management support and education needs related to CHF  Goal: Over the next 90 days the Patient will attend all scheduled medical appointments: with primary care provider and specialist as evidenced by keeping all scheduled appointments        continue to work with RN Care Manager and/or Social Worker to address care management and care coordination needs related to CHF as evidenced by adherence to care management team scheduled appointments     take all medications exactly as prescribed and will call provider for medication related questions as evidenced by compliance with all medications    verbalize  basic understanding of CHF disease process and self health management plan as evidenced by verbal explanation, recognizing/monitoring symptoms, lifestyle modifications  Interventions:   Heart Failure Interventions: Basic overview  and discussion of pathophysiology of Heart Failure reviewed Provided education on low sodium diet Reviewed Heart Failure Action Plan in depth and provided written copy Assessed need for readable accurate scales in home Provided education about placing scale on hard, flat surface Advised patient to weigh each morning after emptying bladder Discussed importance of daily weight and advised patient to weigh and record daily Reviewed role of diuretics in prevention of fluid overload and management of heart failure; Discussed the importance of keeping all appointments with provider Provided patient with education about the role of exercise in the management of heart failure Advised patient to discuss acute changes with provider Screening for signs and symptoms of depression related to chronic disease state  Assessed social determinant of health barriers   Patient Self-Care Activities:  Attend all scheduled provider appointments Attend church or other social activities Call pharmacy for medication refills 3-7 days in advance of running out of medications Call provider office for new concerns or questions  Take medications as prescribed   call office if I gain more than 2 pounds in one day or 5 pounds in one week use salt in moderation watch for swelling in feet, ankles and legs every day weigh myself daily follow rescue plan if symptoms flare-up eat more whole grains, fruits and vegetables, lean meats and healthy fats track symptoms and what helps feel better or worse  Plan:  Telephone follow up appointment with care management team member scheduled for:  04-23-2024 at 10:30 am             Please call the Suicide and Crisis Lifeline: 988 call the [place]  National Suicide Prevention Lifeline: 867-687-2043 or TTY: 262-302-7406 TTY 575-613-5946) to talk to a trained counselor call 1-800-273-TALK (toll free, 24 hour hotline) call the [facility]: 620-453-4828 call 911  if you are experiencing a Mental Health or Behavioral Health Crisis or need someone to talk to.  Patient verbalizes understanding of instructions and care plan provided today and agrees to view in MyChart. Active MyChart status and patient understanding of how to access instructions and care plan via MyChart confirmed with patient.     Rosina Forte, BSN RN [facility], [facility] RN Care Manager Direct Dial: [phone]  Fax: [phone]

## 2024-04-16 ENCOUNTER — Ambulatory Visit: Attending: Cardiovascular Disease

## 2024-04-16 DIAGNOSIS — Z95810 Presence of automatic (implantable) cardiac defibrillator: Secondary | ICD-10-CM

## 2024-04-16 DIAGNOSIS — I5022 Chronic systolic (congestive) heart failure: Secondary | ICD-10-CM

## 2024-04-19 ENCOUNTER — Ambulatory Visit: Admitting: Audiology

## 2024-04-19 NOTE — Progress Notes (Signed)
 EPIC Encounter for ICM Monitoring  Patient Name: [name] is a 73 y.o. female Date: 04/19/2024 Primary Care Physican: Tobie Suzzane POUR, MD Primary Cardiologist: Branch Electrophysiologist: Mealor Bi-V Pacing:  98%        11/02/2023 Weight: 189 lbs 12/07/2023 Weight: 197 lbs (admitted to hospital) 12/13/2023 Weight: 195 lbs  12/18/2023 Weight: 191 lbs 12/19/2023 Weight: 194 lbs 12/26/2023 Weight: 191 lbs 01/11/2024 Weight: 190 lbs 02/22/2024 Weight: 190 lbs 04/19/2024 Weight: 191 lbs                                                        Spoke with patient and heart failure questions reviewed.  Transmission results reviewed.  Pt asymptomatic for fluid accumulation.  Reports feeling well at this time and voices no complaints.     Corvue Thoracic impedance suggesting normal fluid levels within the last month.    Prescribed:  Furosemide  20 mg take 1 tablet by mouth every other day alternating with 2 tablets (40 mg total) every other day.  Spironolactone  25 mg take 1 tablet daily.   Labs: 02/17/2024 Creatinine 1.15, BUN 20, Potassium 4.9, Sodium 138, GFR 50 12/11/2023 Creatinine 1.03, BUN 35, Potassium 4.5, Sodium 136, GFR 57 12/10/2023 Creatinine 1.1,   BUN 36, Potassium 4.3, Sodium 138, GFR 53 12/09/2023 Creatinine 1.17, BUN 40, Potassium 4.8, Sodium 138, GFR 49 12/08/2023 Creatinine 1.3,   BUN 36, Potassium 4.6, Sodium 136, GFR 43 12/07/2023 Creatinine 1.19, BUN 26, Potassium 4.9, Sodium 141, GFR 48 12/03/2023 Creatinine 1.33, BUN 27, Potassium 4.2, Sodium 135, GFR 42  A complete set of results can be found in Results Review.   Recommendations:  No changes and encouraged to call if experiencing any fluid symptoms.   Follow-up plan: ICM clinic phone appointment on 05/22/2024.    91 day device clinic remote transmission 05/07/2024.        EP/Cardiology Office Visits:  Still needs to schedule OV that was missed 01/23/2024 with Olivia Pavy, PA.    Recall 10/29/2024 with Dr Nancey.   Copy of  ICM check sent to Dr. Nancey.  3 month ICM trend: 04/16/2024.    12-14 Month ICM trend:     Christina GORMAN Garner, RN 04/19/2024 4:05 PM

## 2024-04-20 ENCOUNTER — Other Ambulatory Visit: Payer: Self-pay

## 2024-04-20 DIAGNOSIS — R0600 Dyspnea, unspecified: Secondary | ICD-10-CM

## 2024-04-20 MED ORDER — ALBUTEROL SULFATE (2.5 MG/3ML) 0.083% IN NEBU: 2.5000 mg | INHALATION_SOLUTION | Freq: Four times a day (QID) | RESPIRATORY_TRACT | 1 refills | Status: DC | PRN

## 2024-04-23 ENCOUNTER — Other Ambulatory Visit: Payer: Self-pay | Admitting: *Deleted

## 2024-04-23 NOTE — Patient Outreach (Signed)
 Complex Care Management   Visit Note  04/23/2024  Name:  [name] MRN: [id number] DOB: 12-02-1950  Situation: Referral received for Complex Care Management related to Heart Failure and Diabetes with Complications I obtained verbal consent from Patient.  Visit completed with patient  on the phone  Background:   Past Medical History:  Diagnosis Date   Anemia    Arthritis    CAD (coronary artery disease)    a. LHC (03/2014) Lmain: nl, LAD: diff dz proximal 20%, 30-40% dz mid vessel prior 2nd diagonal, LCx: 40% in OM1, 20-30% in OM2, RCA: 99% subtotal occlusion @ crux, TIMI 1 flow, L-R collaterals to distal vessel   Cardiomyopathy (HCC) 03/28/2014   LV dysfunction out of proportion to CAD 03/28/14   CHF (congestive heart failure) (HCC)    [employer]reesia 10/03/2020   Chronic systolic heart failure (HCC)    a. ECHO (03/2014): EF 25-30%, akinesis enteroanteroseptal myocardium, grade III DD b. RHC (03/2014) RA 4, RV 42/4, PA 44/14 (26), PCWP 21, PA 62% Fick CO/CI 6.9 / 3.4   Diabetes mellitus    Diabetes mellitus without complication (HCC)    [employer]reesia 10/03/2020   Duodenal ulcer    Age 73   Dyspnea    Erosive esophagitis    Gastritis    Hypertension    Hypothyroidism    Obese    Short-term memory loss    Thyroid  disease    TTP (thrombotic thrombocytopenic purpura) (HCC) 06/2005    Assessment: Patient Reported Symptoms:  Cognitive Cognitive Status: No symptoms reported Cognitive/Intellectual Conditions Management [RPT]: None reported or documented in medical history or problem list   Health Maintenance Behaviors: Annual physical exam Healing Pattern: Average Health Facilitated by: Prayer/meditation, Rest  Neurological Neurological Review of Symptoms: No symptoms reported Neurological Management Strategies: Routine screening Neurological Self-Management Outcome: 4 (good)  HEENT HEENT Symptoms Reported: No symptoms reported HEENT Management Strategies: Routine screening HEENT  Self-Management Outcome: 4 (good)    Cardiovascular Cardiovascular Symptoms Reported: Swelling in legs or feet Does patient have uncontrolled Hypertension?: No Cardiovascular Management Strategies: Routine screening, Medication therapy Weight: 191 lb (86.6 kg) (patient reported) Cardiovascular Self-Management Outcome: 4 (good)  Respiratory Respiratory Symptoms Reported: No symptoms reported Respiratory Management Strategies: Routine screening Respiratory Self-Management Outcome: 4 (good)  Endocrine Endocrine Symptoms Reported: No symptoms reported Is patient diabetic?: Yes Is patient checking blood sugars at home?: Yes List most recent blood sugar readings, include date and time of day: unable to provide reading this morning, waiting on personal care aid Endocrine Self-Management Outcome: 4 (good)  Gastrointestinal Gastrointestinal Symptoms Reported: No symptoms reported (reports diarrhea stopped a day ago and reports it was probably related to food she ate) Gastrointestinal Self-Management Outcome: 4 (good)    Genitourinary Genitourinary Symptoms Reported: No symptoms reported Genitourinary Self-Management Outcome: 4 (good)  Integumentary Integumentary Symptoms Reported: No symptoms reported Skin Self-Management Outcome: 4 (good)  Musculoskeletal Musculoskelatal Symptoms Reviewed: Weakness Musculoskeletal Management Strategies: Medical device, Routine screening Musculoskeletal Self-Management Outcome: 4 (good) Falls in the past year?: No Number of falls in past year: 1 or less Was there an injury with Fall?: No Fall Risk Category Calculator: 0 Patient Fall Risk Level: Low Fall Risk Patient at Risk for Falls Due to: No Fall Risks Fall risk Follow up: Falls evaluation completed, Education provided  Psychosocial Psychosocial Symptoms Reported: No symptoms reported Behavioral Management Strategies: Support system Behavioral Health Self-Management Outcome: 4 (good) Major  Change/Loss/Stressor/Fears (CP): Denies Techniques to Cope with Loss/Stress/Change: Not applicable Quality of Family Relationships:  helpful, involved, supportive Do you feel physically threatened by others?: No      04/23/2024   10:17 AM  Depression screen PHQ 2/9  Decreased Interest 0  Down, Depressed, Hopeless 0  PHQ - 2 Score 0    There were no vitals filed for this visit.  Medications Reviewed Today     Reviewed by Bertrum Rosina HERO, RN (Registered Nurse) on 04/23/24 at 1008  Med List Status: <None>   Medication Order Taking? Sig Documenting Provider Last Dose Status Informant  acetaminophen  (TYLENOL ) 500 MG tablet [id number] Yes Take 1,000 mg by mouth every 6 (six) hours as needed for moderate pain. [provider]  Active Self  albuterol  (PROVENTIL ) (2.5 MG/3ML) 0.083% nebulizer solution [id number] Yes Take 3 mLs (2.5 mg total) by nebulization every 6 (six) hours as needed for wheezing or shortness of breath. Tobie Suzzane POUR, MD  Active   aspirin  81 MG chewable tablet [id number] Yes Chew 1 tablet (81 mg total) by mouth daily. Odell Celinda Balo, MD  Active Self  atorvastatin  (LIPITOR ) 80 MG tablet [id number] Yes TAKE 1 TABLET EVERY DAY Alvan Dorn FALCON, MD  Active   blood glucose meter kit and supplies [id number] Yes Dispense based on patient and insurance preference. Use up to four times daily as directed.(FOR E11.40). Tobie Suzzane POUR, MD  Active   calcium  carbonate (OSCAL) 1500 (600 Ca) MG TABS tablet [id number] Yes Take 1 tablet by mouth daily with breakfast. [provider]  Active   carvedilol  (COREG ) 6.25 MG tablet [id number] Yes Take 1 tablet (6.25 mg total) by mouth 2 (two) times daily. Alvan Dorn FALCON, MD  Active   Ciclopirox  0.77 % gel [id number]  APPLY ONE TIME DAILY ON THE AFFECTED TOE NAIL  Patient not taking: Reported on 04/23/2024   Tobie Suzzane POUR, MD  Consider Medication Status and Discontinue Self  empagliflozin  (JARDIANCE ) 10 MG TABS tablet  [id number] Yes Take 1 tablet (10 mg total) by mouth daily before breakfast. Alvan Dorn FALCON, MD  Active   furosemide  (LASIX ) 20 MG tablet [id number] Yes Take 1-2 tablets (20-40 mg total) by mouth daily. Alternate 40 mg with 20 mg daily Alvan Dorn FALCON, MD  Active   glipiZIDE  (GLUCOTROL ) 5 MG tablet [id number] Yes Take 1 tablet (5 mg total) by mouth 2 (two) times daily before a meal. Tobie Suzzane POUR, MD  Active   Glucagon  (GVOKE HYPOPEN  2-PACK) 1 MG/0.2ML EMMANUEL [id number] Yes Inject 1 mg into the skin as needed (Blood glucose < 53). Tobie Suzzane POUR, MD  Active   Insulin  Pen Needle 31G X 5 MM MISC [id number] Yes Use as directed for Lantus  and Novolog . Tobie Suzzane POUR, MD  Active   LANTUS  SOLOSTAR 100 UNIT/ML Solostar Pen [id number] Yes INJECT 24 UNITS INTO THE SKIN 2 (TWO) TIMES DAILY.  Patient taking differently: Inject 20 Units into the skin 2 (two) times daily.   Tobie Suzzane POUR, MD  Active   levothyroxine  (SYNTHROID ) 50 MCG tablet [id number] Yes TAKE 1 TABLET EVERY DAY BEFORE BREAKFAST Patel, Rutwik K, MD  Active   losartan  (COZAAR ) 25 MG tablet [id number] Yes TAKE 1 TABLET EVERY DAY Branch, Dorn FALCON, MD  Active   Multiple Vitamin (MULTIVITAMIN) tablet [id number]  Take 1 tablet by mouth daily.  Patient not taking: Reported on 04/23/2024   [provider]  Active Self  ondansetron  (ZOFRAN ) 4 MG tablet [id number]  Take 1 tablet (4 mg total) by mouth every 6 (six) hours.  Patient not taking: Reported on  04/23/2024   Roemhildt, Lorin T, PA-C  Active   polyethylene glycol (MIRALAX  / GLYCOLAX ) packet [id number] Yes Take 17 g by mouth daily as needed for mild constipation. [provider]  Active Self  Semaglutide , 1 MG/DOSE, (OZEMPIC , 1 MG/DOSE,) 4 MG/3ML SOPN [id number] Yes INJECT 1MG  UNDER THE SKIN ONE TIME WEEKLY AS DIRECTED Tobie Suzzane POUR, MD  Active   spironolactone  (ALDACTONE ) 25 MG tablet [id number] Yes TAKE 1 TABLET EVERY DAY Alvan Dorn FALCON, MD  Active   TRUE METRIX BLOOD GLUCOSE  TEST test strip [id number] Yes TEST BLOOD SUGAR UP TO FOUR TIMES DAILY AS DIRECTED Tobie Suzzane POUR, MD  Active   TRUEplus Lancets 33G MISC [id number] Yes USE AS DIRECTED TO TEST BLOOD SUGAR UP TO FOUR TIMES DAILY Tobie Suzzane POUR, MD  Active             Recommendation:   Continue Current Plan of Care  Follow Up Plan:   Telephone follow-up in 1 month  Rosina Forte, BSN RN [facility], [facility] RN Care Manager Direct Dial: [phone]  Fax: [phone]

## 2024-04-23 NOTE — Patient Instructions (Signed)
 Visit Information  Thank you for taking time to visit with me today. Please don't hesitate to contact me if I can be of assistance to you before our next scheduled appointment.  Your next care management appointment is by telephone on 05-23-2024 at 10:00 am  Telephone follow-up in 1 month  Please call the care guide team at [phone] if you need to cancel, schedule, or reschedule an appointment.   Please call the Suicide and Crisis Lifeline: 988 call the [place]  National Suicide Prevention Lifeline: (225)198-4200 or TTY: (717)558-6535 TTY 217-587-4530) to talk to a trained counselor call 1-800-273-TALK (toll free, 24 hour hotline) call the [facility]: 562-665-9564 call 911 if you are experiencing a Mental Health or Behavioral Health Crisis or need someone to talk to.  Rosina Forte, BSN RN [facility], [facility] RN Care Manager Direct Dial: [phone]  Fax: [phone]

## 2024-05-07 ENCOUNTER — Ambulatory Visit (INDEPENDENT_AMBULATORY_CARE_PROVIDER_SITE_OTHER): Payer: Medicare HMO

## 2024-05-07 DIAGNOSIS — I5022 Chronic systolic (congestive) heart failure: Secondary | ICD-10-CM | POA: Diagnosis not present

## 2024-05-09 ENCOUNTER — Telehealth: Payer: Self-pay | Admitting: *Deleted

## 2024-05-09 LAB — CUP PACEART REMOTE DEVICE CHECK
Battery Remaining Longevity: 16 mo
Battery Remaining Percentage: 24 %
Battery Voltage: 2.8 V
Brady Statistic AP VP Percent: 1 %
Brady Statistic AP VS Percent: 1 %
Brady Statistic AS VP Percent: 98 %
Brady Statistic AS VS Percent: 1.9 %
Brady Statistic RA Percent Paced: 1 %
Date Time Interrogation Session: 20250818184317
HighPow Impedance: 52 Ohm
HighPow Impedance: 52 Ohm
Implantable Lead Connection Status: 753985
Implantable Lead Connection Status: 753985
Implantable Lead Connection Status: 753985
Implantable Lead Implant Date: 20200813
Implantable Lead Implant Date: 20200813
Implantable Lead Implant Date: 20200813
Implantable Lead Location: 753858
Implantable Lead Location: 753859
Implantable Lead Location: 753860
Implantable Pulse Generator Implant Date: 20200813
Lead Channel Impedance Value: 340 Ohm
Lead Channel Impedance Value: 360 Ohm
Lead Channel Impedance Value: 380 Ohm
Lead Channel Pacing Threshold Amplitude: 0.75 V
Lead Channel Pacing Threshold Amplitude: 0.75 V
Lead Channel Pacing Threshold Amplitude: 2 V
Lead Channel Pacing Threshold Pulse Width: 0.4 ms
Lead Channel Pacing Threshold Pulse Width: 0.5 ms
Lead Channel Pacing Threshold Pulse Width: 1 ms
Lead Channel Sensing Intrinsic Amplitude: 11.3 mV
Lead Channel Sensing Intrinsic Amplitude: 2.9 mV
Lead Channel Setting Pacing Amplitude: 2 V
Lead Channel Setting Pacing Amplitude: 2 V
Lead Channel Setting Pacing Amplitude: 2.5 V
Lead Channel Setting Pacing Pulse Width: 0.5 ms
Lead Channel Setting Pacing Pulse Width: 1 ms
Lead Channel Setting Sensing Sensitivity: 0.5 mV
Pulse Gen Serial Number: 9891600
Zone Setting Status: 755011

## 2024-05-09 NOTE — Telephone Encounter (Signed)
 BIV ICD scheduled remote reviewed. Normal device function.  Presenting rhythm: AS-BVP w/ PVCs Several AMS events. Longest x 13 hrs on 04/18/24. No OAC listed in chart.  Trends show recent increase in AF burden. Sending to triage per protocol.  HF diagnostics abnormal during this monitoring period.   Next remote 91 days. AB, CVRS _____________________________________________________________________________  Routing to device triage to call patient tomorrow morning

## 2024-05-10 NOTE — Telephone Encounter (Signed)
 Called patient to notify them of the new afib findings on her heart device  This RN explained what Afib was to the patient, how it increases her risk of stroke, and how her provider would like for her to get checked out and discuss treatment options at the Afib clinic  Patient agreeable to this  Message sent to AF clinic scheduler for 8/26 at 1330 and requested them to call patient with appt confirmation as the patient had requested   Current address and location of the clinic given/explained to the patient  Patient verbalized back to this RN the correct address and location of her appointment   All questions and concerns addressed at this time  Patient appreciative of phone call

## 2024-05-14 ENCOUNTER — Ambulatory Visit: Payer: Self-pay | Admitting: Cardiovascular Disease

## 2024-05-14 ENCOUNTER — Other Ambulatory Visit: Payer: Self-pay | Admitting: Cardiology

## 2024-05-14 ENCOUNTER — Other Ambulatory Visit: Payer: Self-pay | Admitting: Internal Medicine

## 2024-05-14 DIAGNOSIS — E1165 Type 2 diabetes mellitus with hyperglycemia: Secondary | ICD-10-CM

## 2024-05-15 ENCOUNTER — Encounter (HOSPITAL_COMMUNITY): Payer: Self-pay | Admitting: Internal Medicine

## 2024-05-15 ENCOUNTER — Ambulatory Visit (HOSPITAL_COMMUNITY)
Admission: RE | Admit: 2024-05-15 | Discharge: 2024-05-15 | Disposition: A | Discharge status: Home / Self Care | Source: Ambulatory Visit | Attending: Internal Medicine | Admitting: Internal Medicine

## 2024-05-15 VITALS — BP 118/80 | HR 90 | Ht 65.0 in | Wt 187.4 lb

## 2024-05-15 DIAGNOSIS — D6869 Other thrombophilia: Secondary | ICD-10-CM | POA: Diagnosis not present

## 2024-05-15 DIAGNOSIS — I4891 Unspecified atrial fibrillation: Secondary | ICD-10-CM | POA: Diagnosis not present

## 2024-05-15 DIAGNOSIS — I480 Paroxysmal atrial fibrillation: Secondary | ICD-10-CM

## 2024-05-15 MED ORDER — APIXABAN 5 MG PO TABS: 5.0000 mg | ORAL_TABLET | Freq: Two times a day (BID) | ORAL | 3 refills | Status: DC

## 2024-05-15 NOTE — Progress Notes (Signed)
 Primary Care Physician: Tobie Suzzane POUR, MD Primary Cardiologist: None Electrophysiologist: None     Referring Physician: Dr. Nancey Cindia KATHEE [name] is a 73 y.o. female with a history of chronic systolic and diastolic heart failure, s/p BIV ICD, CAD, OSA on bipap, HLD, LBBB, HTN, cardiomyopathy, and atrial fibrillation who presents for consultation in the [facility]. Device clinic alert 8/25 for new Afib. Patient has a CHADS2VASC score of 6.  On evaluation today, patient is currently in V paced rhythm. She feels tired and notes swelling in bilateral LE. She takes ASA 81 mg daily.    Today, she denies symptoms of palpitations, chest pain, shortness of breath, orthopnea, PND, lower extremity edema, dizziness, presyncope, syncope, snoring, daytime somnolence, bleeding, or neurologic sequela. The patient is tolerating medications without difficulties and is otherwise without complaint today.    Atrial Fibrillation Risk Factors:  she does have symptoms or diagnosis of sleep apnea. she is compliant with CPAP therapy.   she has a BMI of Body mass index is 31.18 kg/m.[name] Filed Weights   05/15/24 1316  Weight: 85 kg    Current Outpatient Medications  Medication Sig Dispense Refill   acetaminophen  (TYLENOL ) 500 MG tablet Take 1,000 mg by mouth every 6 (six) hours as needed for moderate pain.     albuterol  (PROVENTIL ) (2.5 MG/3ML) 0.083% nebulizer solution Take 3 mLs (2.5 mg total) by nebulization every 6 (six) hours as needed for wheezing or shortness of breath. 360 mL 1   apixaban  (ELIQUIS ) 5 MG TABS tablet Take 1 tablet (5 mg total) by mouth 2 (two) times daily. 60 tablet 3   aspirin  81 MG chewable tablet Chew 1 tablet (81 mg total) by mouth daily.     atorvastatin  (LIPITOR ) 80 MG tablet TAKE 1 TABLET EVERY DAY 90 tablet 3   blood glucose meter kit and supplies Dispense based on patient and insurance preference. Use up to four times daily as  directed.(FOR E11.40). 1 each 0   calcium  carbonate (OSCAL) 1500 (600 Ca) MG TABS tablet Take 1 tablet by mouth daily with breakfast.     carvedilol  (COREG ) 6.25 MG tablet Take 1 tablet (6.25 mg total) by mouth 2 (two) times daily. 180 tablet 3   empagliflozin  (JARDIANCE ) 10 MG TABS tablet Take 1 tablet (10 mg total) by mouth daily before breakfast. 30 tablet 11   furosemide  (LASIX ) 20 MG tablet Take 1-2 tablets (20-40 mg total) by mouth daily. Alternate 40 mg with 20 mg daily 135 tablet 1   glipiZIDE  (GLUCOTROL ) 5 MG tablet Take 1 tablet (5 mg total) by mouth 2 (two) times daily before a meal. 60 tablet 3   Glucagon  (GVOKE HYPOPEN  2-PACK) 1 MG/0.2ML SOAJ Inject 1 mg into the skin as needed (Blood glucose < 53). 0.4 mL 5   Insulin  Pen Needle 31G X 5 MM MISC Use as directed for Lantus  and Novolog . 100 each 5   LANTUS  SOLOSTAR 100 UNIT/ML Solostar Pen INJECT 24 UNITS INTO THE SKIN 2 (TWO) TIMES DAILY. (Patient taking differently: Inject 20 Units into the skin 2 (two) times daily.) 45 mL 3   levothyroxine  (SYNTHROID ) 50 MCG tablet TAKE 1 TABLET EVERY DAY BEFORE BREAKFAST 90 tablet 3   losartan  (COZAAR ) 25 MG tablet TAKE 1 TABLET EVERY DAY 90 tablet 3   Multiple Vitamin (MULTIVITAMIN) tablet Take 1 tablet by mouth daily.     polyethylene glycol (MIRALAX  / GLYCOLAX ) packet Take 17 g by mouth  daily as needed for mild constipation.     Semaglutide , 1 MG/DOSE, (OZEMPIC , 1 MG/DOSE,) 4 MG/3ML SOPN INJECT 1MG  UNDER THE SKIN ONE TIME WEEKLY AS DIRECTED 9 mL 3   spironolactone  (ALDACTONE ) 25 MG tablet TAKE 1 TABLET EVERY DAY 90 tablet 3   TRUE METRIX BLOOD GLUCOSE TEST test strip TEST BLOOD SUGAR UP TO FOUR TIMES DAILY AS DIRECTED 400 strip 3   TRUEplus Lancets 33G MISC USE AS DIRECTED TO TEST BLOOD SUGAR UP TO FOUR TIMES DAILY 400 each 3   No current facility-administered medications for this encounter.    Atrial Fibrillation Management history:  Previous antiarrhythmic drugs: none Previous  cardioversions: none Previous ablations: none Anticoagulation history: none   ROS- All systems are reviewed and negative except as per the HPI above.  Physical Exam: BP 118/80   Pulse 90   Ht 5' 5 (1.651 m)   Wt 85 kg   BMI 31.18 kg/m   GEN: Well nourished, well developed in no acute distress NECK: No JVD; No carotid bruits CARDIAC: Regular rate (V paced), no murmurs, rubs, gallops RESPIRATORY:  Clear to auscultation without rales, wheezing or rhonchi  ABDOMEN: Soft, non-tender, non-distended EXTREMITIES:  No edema; No deformity   EKG today demonstrates  Vent. rate 90 BPM PR interval * ms QRS duration 134 ms QT/QTcB 414/506 ms P-R-T axes * 80 115 Ventricular-paced rhythm Biventricular pacemaker detected Abnormal ECG When compared with ECG of 28-Dec-2023 12:30, Vent. rate has decreased BY 5 BPM  Echo 02/01/22 demonstrated  1. Left ventricular ejection fraction, by estimation, is 40 to 45%. The  left ventricle has mildly decreased function. The left ventricle  demonstrates global hypokinesis. There is mild left ventricular  hypertrophy. Left ventricular diastolic parameters  are consistent with Grade I diastolic dysfunction (impaired relaxation).  Elevated left atrial pressure.   2. Right ventricular systolic function is normal. The right ventricular  size is normal. Tricuspid regurgitation signal is inadequate for assessing  PA pressure.   3. The mitral valve is normal in structure. No evidence of mitral valve  regurgitation. No evidence of mitral stenosis.   4. The aortic valve has an indeterminant number of cusps. Aortic valve  regurgitation is not visualized. No aortic stenosis is present.   ASSESSMENT & PLAN CHA2DS2-VASc Score = 6  The patient's score is based upon: CHF History: 1 HTN History: 1 Diabetes History: 0 Stroke History: 0 Vascular Disease History: 1 Age Score: 2 Gender Score: 1       ASSESSMENT AND PLAN: Paroxysmal Atrial Fibrillation  (ICD10:  I48.0) The patient's CHA2DS2-VASc score is 6, indicating a 9.7% annual risk of stroke.    Patient is currently in V paced rhythm. She is here with granddaughter today. Education provided about Afib. Discussion about possible cardioversion if she remains out of rhythm. Continue coreg  6.25 mg BID.  Secondary Hypercoagulable State (ICD10:  D68.69) The patient is at significant risk for stroke/thromboembolism based upon her CHA2DS2-VASc Score of 6.  Start Apixaban  (Eliquis ).  We discussed the reasoning behind anticoagulation in the setting of stroke prevention related to Afib. We discussed the benefits vs risks of anticoagulation. After discussion, patient would like to begin anticoagulation and understands the potential risks. Will begin Eliquis  5 mg BID and have patient follow up in 3 weeks to determine if needs cardioversion. Will discuss with primary cardiologist whether to continue ASA.     Follow up 3 weeks Afib clinic.    Terra Pac, PA-C  [facility]  [address] [place], [place] [address] [phone]

## 2024-05-15 NOTE — Patient Instructions (Signed)
 Start Eliquis 5mg  twice a day

## 2024-05-15 NOTE — Addendum Note (Signed)
 Encounter addended by: Janel Nancy SAUNDERS, RN on: 05/15/2024 4:54 PM  Actions taken: Order list changed

## 2024-05-22 ENCOUNTER — Ambulatory Visit: Attending: Cardiovascular Disease

## 2024-05-22 DIAGNOSIS — I5022 Chronic systolic (congestive) heart failure: Secondary | ICD-10-CM | POA: Diagnosis not present

## 2024-05-22 DIAGNOSIS — Z95810 Presence of automatic (implantable) cardiac defibrillator: Secondary | ICD-10-CM | POA: Diagnosis not present

## 2024-05-23 ENCOUNTER — Other Ambulatory Visit: Payer: Self-pay | Admitting: *Deleted

## 2024-05-23 NOTE — Patient Outreach (Signed)
 Complex Care Management   Visit Note  05/23/2024  Name:  [name] MRN: [id number] DOB: 24-May-1951  Situation: Referral received for Complex Care Management related to Heart Failure and Diabetes with Complications I obtained verbal consent from Patient.  Visit completed with Patient  on the phone. Patient ended call abruptly, attempted to return call and no answer - voicemail left with return call.  Background:   Past Medical History:  Diagnosis Date   Anemia    Arthritis    CAD (coronary artery disease)    a. LHC (03/2014) Lmain: nl, LAD: diff dz proximal 20%, 30-40% dz mid vessel prior 2nd diagonal, LCx: 40% in OM1, 20-30% in OM2, RCA: 99% subtotal occlusion @ crux, TIMI 1 flow, L-R collaterals to distal vessel   Cardiomyopathy (HCC) 03/28/2014   LV dysfunction out of proportion to CAD 03/28/14   CHF (congestive heart failure) (HCC)    Phreesia 10/03/2020   Chronic systolic heart failure (HCC)    a. ECHO (03/2014): EF 25-30%, akinesis enteroanteroseptal myocardium, grade III DD b. RHC (03/2014) RA 4, RV 42/4, PA 44/14 (26), PCWP 21, PA 62% Fick CO/CI 6.9 / 3.4   Diabetes mellitus    Diabetes mellitus without complication (HCC)    Phreesia 10/03/2020   Duodenal ulcer    Age 23   Dyspnea    Erosive esophagitis    Gastritis    Hypertension    Hypothyroidism    Obese    Short-term memory loss    Thyroid  disease    TTP (thrombotic thrombocytopenic purpura) (HCC) 06/2005    Assessment: Patient Reported Symptoms:  Cognitive Cognitive Status: No symptoms reported Cognitive/Intellectual Conditions Management [RPT]: None reported or documented in medical history or problem list   Health Maintenance Behaviors: Annual physical exam Healing Pattern: Average Health Facilitated by: Prayer/meditation  Neurological Neurological Review of Symptoms: Not assessed    HEENT HEENT Symptoms Reported: Not assessed      Cardiovascular Cardiovascular Symptoms Reported: Not assessed     Respiratory Respiratory Symptoms Reported: Not assesed    Endocrine Endocrine Symptoms Reported: Not assessed Is patient diabetic?: Yes    Gastrointestinal Gastrointestinal Symptoms Reported: Not assessed      Genitourinary Genitourinary Symptoms Reported: Not assessed    Integumentary Integumentary Symptoms Reported: Not assessed    Musculoskeletal Musculoskelatal Symptoms Reviewed: Weakness, Difficulty walking, Limited mobility Musculoskeletal Management Strategies: Medical device      Psychosocial Psychosocial Symptoms Reported: Not assessed          05/23/2024    PHQ2-9 Depression Screening   Little interest or pleasure in doing things    Feeling down, depressed, or hopeless    PHQ-2 - Total Score    Trouble falling or staying asleep, or sleeping too much    Feeling tired or having little energy    Poor appetite or overeating     Feeling bad about yourself - or that you are a failure or have let yourself or your family down    Trouble concentrating on things, such as reading the newspaper or watching television    Moving or speaking so slowly that other people could have noticed.  Or the opposite - being so fidgety or restless that you have been moving around a lot more than usual    Thoughts that you would be better off dead, or hurting yourself in some way    PHQ2-9 Total Score    If you checked off any problems, how difficult have these problems made it  for you to do your work, take care of things at home, or get along with other people    Depression Interventions/Treatment      There were no vitals filed for this visit.  Medications Reviewed Today     Reviewed by Bertrum Rosina HERO, RN (Registered Nurse) on 05/23/24 at 1026  Med List Status: <None>   Medication Order Taking? Sig Documenting Provider Last Dose Status Informant  acetaminophen  (TYLENOL ) 500 MG tablet [id number] Yes Take 1,000 mg by mouth every 6 (six) hours as needed for moderate pain. [provider]  Active Self  albuterol  (PROVENTIL ) (2.5 MG/3ML) 0.083% nebulizer solution [id number] Yes Take 3 mLs (2.5 mg total) by nebulization every 6 (six) hours as needed for wheezing or shortness of breath. Tobie Suzzane POUR, MD  Active   apixaban  (ELIQUIS ) 5 MG TABS tablet [id number] Yes Take 1 tablet (5 mg total) by mouth 2 (two) times daily. Terra Fairy PARAS, PA-C  Active   atorvastatin  (LIPITOR ) 80 MG tablet [id number] Yes TAKE 1 TABLET EVERY DAY Branch, Dorn FALCON, MD  Active   blood glucose meter kit and supplies [id number] Yes Dispense based on patient and insurance preference. Use up to four times daily as directed.(FOR E11.40). Tobie Suzzane POUR, MD  Active   calcium  carbonate (OSCAL) 1500 (600 Ca) MG TABS tablet [id number] Yes Take 1 tablet by mouth daily with breakfast. [provider]  Active   carvedilol  (COREG ) 6.25 MG tablet [id number] Yes Take 1 tablet (6.25 mg total) by mouth 2 (two) times daily. Alvan Dorn FALCON, MD  Active   empagliflozin  (JARDIANCE ) 10 MG TABS tablet [id number] Yes Take 1 tablet (10 mg total) by mouth daily before breakfast. Alvan Dorn FALCON, MD  Active   furosemide  (LASIX ) 20 MG tablet [id number] Yes TAKE 1-2 TABLETS (20-40MG  TOTAL) DAILY AS DIRECTED. ALTERNATE 40MG  WITH 20MG  DAILY (DIRECTIONS CHANGE) Branch, Dorn FALCON, MD  Active   glipiZIDE  (GLUCOTROL ) 5 MG tablet [id number] Yes Take 1 tablet (5 mg total) by mouth 2 (two) times daily before a meal. Tobie Suzzane POUR, MD  Active   Glucagon  (GVOKE HYPOPEN  2-PACK) 1 MG/0.2ML EMMANUEL [id number] Yes Inject 1 mg into the skin as needed (Blood glucose < 53). Tobie Suzzane POUR, MD  Active   Insulin  Pen Needle 31G X 5 MM MISC [id number] Yes Use as directed for Lantus  and Novolog . Tobie Suzzane POUR, MD  Active   LANTUS  SOLOSTAR 100 UNIT/ML Solostar Pen [id number] Yes INJECT 24 UNITS INTO THE SKIN 2 (TWO) TIMES DAILY.  Patient taking differently: Inject 20 Units into the skin 2 (two) times daily.   Tobie Suzzane POUR, MD  Active    levothyroxine  (SYNTHROID ) 50 MCG tablet [id number] Yes TAKE 1 TABLET EVERY DAY BEFORE BREAKFAST Patel, Rutwik K, MD  Active   losartan  (COZAAR ) 25 MG tablet [id number] Yes TAKE 1 TABLET EVERY DAY Branch, Dorn FALCON, MD  Active   Multiple Vitamin (MULTIVITAMIN) tablet [id number] Yes Take 1 tablet by mouth daily. [provider]  Active Self  polyethylene glycol (MIRALAX  / GLYCOLAX ) packet [id number] Yes Take 17 g by mouth daily as needed for mild constipation. [provider]  Active Self  Semaglutide , 1 MG/DOSE, (OZEMPIC , 1 MG/DOSE,) 4 MG/3ML SOPN [id number] Yes INJECT 1MG  UNDER THE SKIN ONE TIME WEEKLY AS DIRECTED Tobie Suzzane POUR, MD  Active   spironolactone  (ALDACTONE ) 25 MG tablet [id number] Yes TAKE 1 TABLET EVERY DAY Branch, Dorn FALCON, MD  Active   TRUE METRIX BLOOD GLUCOSE TEST test strip  [id number] Yes TEST BLOOD SUGAR UP TO FOUR TIMES DAILY AS DIRECTED Tobie Suzzane POUR, MD  Active   TRUEplus Lancets 33G MISC [id number] Yes USE AS DIRECTED TO TEST BLOOD SUGAR UP TO FOUR TIMES DAILY Tobie Suzzane POUR, MD  Active             Recommendation:   Continue Current Plan of Care  Follow Up Plan:   Telephone follow-up in 1 month  Rosina Forte, BSN RN [facility], [facility] RN Care Manager Direct Dial: [phone]  Fax: [phone]

## 2024-05-23 NOTE — Progress Notes (Signed)
 EPIC Encounter for ICM Monitoring  Patient Name: SKY PRIMO is a 73 y.o. female Date: 05/23/2024 Primary Care Physican: Tobie Suzzane POUR, MD Primary Cardiologist: Branch Electrophysiologist: Mealor Bi-V Pacing:  98%        11/02/2023 Weight: 189 lbs 12/07/2023 Weight: 197 lbs (admitted to hospital) 12/13/2023 Weight: 195 lbs  12/18/2023 Weight: 191 lbs 12/19/2023 Weight: 194 lbs 12/26/2023 Weight: 191 lbs 01/11/2024 Weight: 190 lbs 02/22/2024 Weight: 190 lbs 04/19/2024 Weight: 191 lbs 05/15/2024 Office Weight: 187 lbs  AT/AF Burden since 03/28/2024 <1% (started Eliquis  8/26)                                                        Transmission results reviewed.      [facility] Thoracic impedance suggesting intermittent days with possible fluid accumulation within the last months.    Prescribed:  Furosemide  20 mg take 1 tablet by mouth every other day alternating with 2 tablets (40 mg total) every other day.  Spironolactone  25 mg take 1 tablet daily.   Labs: 02/17/2024 Creatinine 1.15, BUN 20, Potassium 4.9, Sodium 138, GFR 50 12/11/2023 Creatinine 1.03, BUN 35, Potassium 4.5, Sodium 136, GFR 57 12/10/2023 Creatinine 1.1,   BUN 36, Potassium 4.3, Sodium 138, GFR 53 12/09/2023 Creatinine 1.17, BUN 40, Potassium 4.8, Sodium 138, GFR 49 12/08/2023 Creatinine 1.3,   BUN 36, Potassium 4.6, Sodium 136, GFR 43 12/07/2023 Creatinine 1.19, BUN 26, Potassium 4.9, Sodium 141, GFR 48 12/03/2023 Creatinine 1.33, BUN 27, Potassium 4.2, Sodium 135, GFR 42  A complete set of results can be found in Results Review.   Recommendations:  No changes.   Follow-up plan: ICM clinic phone appointment on 07/02/2024.    91 day device clinic remote transmission 08/06/2024.        EP/Cardiology Office Visits: Missed 01/23/2024 with Olivia Pavy, PA needs rescheduling.    Recall 10/29/2024 with Dr Nancey.   Copy of ICM check sent to Dr. Nancey.  3 month ICM trend: 05/20/2024.    Mitzie GORMAN Garner, RN 05/23/2024 2:01  PM

## 2024-05-23 NOTE — Patient Instructions (Signed)
 Visit Information  Thank you for taking time to visit with me today. Please don't hesitate to contact me if I can be of assistance to you before our next scheduled appointment.  Your next care management appointment is by telephone on 06-22-2024 at 9:30 am  Telephone follow-up in 1 month  Please call the care guide team at [phone] if you need to cancel, schedule, or reschedule an appointment.   Please call the Suicide and Crisis Lifeline: 988 call the [place]  National Suicide Prevention Lifeline: 620-274-3034 or TTY: 3608849204 TTY [phone]) to talk to a trained counselor call 1-800-273-TALK (toll free, 24 hour hotline) call the [facility]: [phone] call 911 if you are experiencing a Mental Health or Behavioral Health Crisis or need someone to talk to.  Rosina Forte, BSN RN [facility], [facility] RN Care Manager Direct Dial: [phone]  Fax: [phone]

## 2024-06-03 ENCOUNTER — Other Ambulatory Visit: Payer: Self-pay | Admitting: Internal Medicine

## 2024-06-03 DIAGNOSIS — R0600 Dyspnea, unspecified: Secondary | ICD-10-CM

## 2024-06-05 ENCOUNTER — Ambulatory Visit (HOSPITAL_COMMUNITY): Admission: RE | Admit: 2024-06-05 | Source: Ambulatory Visit | Admitting: Internal Medicine

## 2024-06-05 NOTE — Progress Notes (Incomplete)
 Primary Care Physician: Tobie Suzzane POUR, MD Primary Cardiologist: None Electrophysiologist: None     Referring Physician: Dr. Nancey Cindia KATHEE [name] is a 72 y.o. female with a history of chronic systolic and diastolic heart failure, s/p BIV ICD, CAD, OSA on bipap, HLD, LBBB, HTN, cardiomyopathy, and atrial fibrillation who presents for consultation in the [facility]. Device clinic alert 8/25 for new Afib. Patient has a CHADS2VASC score of 6.  On evaluation today, patient is currently in V paced rhythm. She feels tired and notes swelling in bilateral LE. She takes ASA 81 mg daily.    On follow up 06/05/24, patient is currently in ***. No missed doses of Eliquis  since 8/27.   Today, she denies symptoms of palpitations, chest pain, shortness of breath, orthopnea, PND, lower extremity edema, dizziness, presyncope, syncope, snoring, daytime somnolence, bleeding, or neurologic sequela. The patient is tolerating medications without difficulties and is otherwise without complaint today.    Atrial Fibrillation Risk Factors:  she does have symptoms or diagnosis of sleep apnea. she is compliant with CPAP therapy.   she has a BMI of There is no height or weight on file to calculate BMI.. There were no vitals filed for this visit.   Current Outpatient Medications  Medication Sig Dispense Refill   acetaminophen  (TYLENOL ) 500 MG tablet Take 1,000 mg by mouth every 6 (six) hours as needed for moderate pain.     albuterol  (PROVENTIL ) (2.5 MG/3ML) 0.083% nebulizer solution INHALE THE CONTENTS OF 1 VIAL VIA NEBULIZER EVERY 6 HOURS AS NEEDED FOR SHORTNESS OF BREATH OR WHEEZING 360 mL 11   apixaban  (ELIQUIS ) 5 MG TABS tablet Take 1 tablet (5 mg total) by mouth 2 (two) times daily. 60 tablet 3   atorvastatin  (LIPITOR ) 80 MG tablet TAKE 1 TABLET EVERY DAY 90 tablet 3   blood glucose meter kit and supplies Dispense based on patient and insurance preference. Use up to  four times daily as directed.(FOR E11.40). 1 each 0   calcium  carbonate (OSCAL) 1500 (600 Ca) MG TABS tablet Take 1 tablet by mouth daily with breakfast.     carvedilol  (COREG ) 6.25 MG tablet Take 1 tablet (6.25 mg total) by mouth 2 (two) times daily. 180 tablet 3   empagliflozin  (JARDIANCE ) 10 MG TABS tablet Take 1 tablet (10 mg total) by mouth daily before breakfast. 30 tablet 11   furosemide  (LASIX ) 20 MG tablet TAKE 1-2 TABLETS (20-40MG  TOTAL) DAILY AS DIRECTED. ALTERNATE 40MG  WITH 20MG  DAILY (DIRECTIONS CHANGE) 135 tablet 1   glipiZIDE  (GLUCOTROL ) 5 MG tablet Take 1 tablet (5 mg total) by mouth 2 (two) times daily before a meal. 60 tablet 3   Glucagon  (GVOKE HYPOPEN  2-PACK) 1 MG/0.2ML SOAJ Inject 1 mg into the skin as needed (Blood glucose < 53). 0.4 mL 5   Insulin  Pen Needle 31G X 5 MM MISC Use as directed for Lantus  and Novolog . 100 each 5   LANTUS  SOLOSTAR 100 UNIT/ML Solostar Pen INJECT 24 UNITS INTO THE SKIN 2 (TWO) TIMES DAILY. (Patient taking differently: Inject 20 Units into the skin 2 (two) times daily.) 45 mL 3   levothyroxine  (SYNTHROID ) 50 MCG tablet TAKE 1 TABLET EVERY DAY BEFORE BREAKFAST 90 tablet 3   losartan  (COZAAR ) 25 MG tablet TAKE 1 TABLET EVERY DAY 90 tablet 3   Multiple Vitamin (MULTIVITAMIN) tablet Take 1 tablet by mouth daily.     polyethylene glycol (MIRALAX  / GLYCOLAX ) packet Take 17 g by mouth  daily as needed for mild constipation.     Semaglutide , 1 MG/DOSE, (OZEMPIC , 1 MG/DOSE,) 4 MG/3ML SOPN INJECT 1MG  UNDER THE SKIN ONE TIME WEEKLY AS DIRECTED 9 mL 3   spironolactone  (ALDACTONE ) 25 MG tablet TAKE 1 TABLET EVERY DAY 90 tablet 3   TRUE METRIX BLOOD GLUCOSE TEST test strip TEST BLOOD SUGAR UP TO FOUR TIMES DAILY AS DIRECTED 400 strip 3   TRUEplus Lancets 33G MISC USE AS DIRECTED TO TEST BLOOD SUGAR UP TO FOUR TIMES DAILY 400 each 3   No current facility-administered medications for this visit.    Atrial Fibrillation Management history:  Previous antiarrhythmic  drugs: none Previous cardioversions: none Previous ablations: none Anticoagulation history: Eliquis    ROS- All systems are reviewed and negative except as per the HPI above.  Physical Exam: There were no vitals taken for this visit.  GEN- The patient is well appearing, alert and oriented x 3 today.   Neck - no JVD or carotid bruit noted Lungs- Clear to ausculation bilaterally, normal work of breathing Heart- ***Regular rate and rhythm, no murmurs, rubs or gallops, PMI not laterally displaced Extremities- no clubbing, cyanosis, or edema Skin - no rash or ecchymosis noted   EKG today demonstrates  ***  Echo 02/01/22 demonstrated  1. Left ventricular ejection fraction, by estimation, is 40 to 45%. The  left ventricle has mildly decreased function. The left ventricle  demonstrates global hypokinesis. There is mild left ventricular  hypertrophy. Left ventricular diastolic parameters  are consistent with Grade I diastolic dysfunction (impaired relaxation).  Elevated left atrial pressure.   2. Right ventricular systolic function is normal. The right ventricular  size is normal. Tricuspid regurgitation signal is inadequate for assessing  PA pressure.   3. The mitral valve is normal in structure. No evidence of mitral valve  regurgitation. No evidence of mitral stenosis.   4. The aortic valve has an indeterminant number of cusps. Aortic valve  regurgitation is not visualized. No aortic stenosis is present.   ASSESSMENT & PLAN CHA2DS2-VASc Score = 6  The patient's score is based upon: CHF History: 1 HTN History: 1 Diabetes History: 0 Stroke History: 0 Vascular Disease History: 1 Age Score: 2 Gender Score: 1       ASSESSMENT AND PLAN: Paroxysmal Atrial Fibrillation (ICD10:  I48.0) The patient's CHA2DS2-VASc score is 6, indicating a 9.7% annual risk of stroke.    Patient is currently in ***. Continue coreg  6.25 mg BID.   Secondary Hypercoagulable State (ICD10:  D68.69) The  patient is at significant risk for stroke/thromboembolism based upon her CHA2DS2-VASc Score of 6.  Start Apixaban  (Eliquis ).  No missed doses of Eliquis .     Follow up ***.    Terra Pac, PA-C  Af[facility] Clinic [facility] [address] [place], [place] [address] [phone]

## 2024-06-12 ENCOUNTER — Ambulatory Visit (HOSPITAL_COMMUNITY)
Admission: RE | Admit: 2024-06-12 | Discharge: 2024-06-12 | Disposition: A | Discharge status: Home / Self Care | Source: Ambulatory Visit | Attending: Internal Medicine | Admitting: Internal Medicine

## 2024-06-12 ENCOUNTER — Encounter (HOSPITAL_COMMUNITY): Payer: Self-pay | Admitting: Internal Medicine

## 2024-06-12 VITALS — BP 76/58 | HR 96 | Ht 65.0 in | Wt 206.4 lb

## 2024-06-12 DIAGNOSIS — I5043 Acute on chronic combined systolic (congestive) and diastolic (congestive) heart failure: Secondary | ICD-10-CM

## 2024-06-12 DIAGNOSIS — D6869 Other thrombophilia: Secondary | ICD-10-CM | POA: Diagnosis not present

## 2024-06-12 DIAGNOSIS — I4819 Other persistent atrial fibrillation: Secondary | ICD-10-CM | POA: Diagnosis not present

## 2024-06-12 DIAGNOSIS — E861 Hypovolemia: Secondary | ICD-10-CM | POA: Diagnosis not present

## 2024-06-12 DIAGNOSIS — I4891 Unspecified atrial fibrillation: Secondary | ICD-10-CM | POA: Insufficient documentation

## 2024-06-12 DIAGNOSIS — I480 Paroxysmal atrial fibrillation: Secondary | ICD-10-CM | POA: Insufficient documentation

## 2024-06-12 MED ORDER — POTASSIUM CHLORIDE ER 10 MEQ PO TBCR: 10.0000 meq | EXTENDED_RELEASE_TABLET | Freq: Every day | ORAL | 6 refills | Status: DC

## 2024-06-12 NOTE — Progress Notes (Incomplete)
 Primary Care Physician: Tobie Suzzane POUR, MD Primary Cardiologist: None Electrophysiologist: None     Referring Physician: Dr. Nancey Cindia KATHEE [name] is a 73 y.o. female with a history of chronic systolic and diastolic heart failure, s/p BIV ICD, CAD, OSA on bipap, HLD, LBBB, HTN, cardiomyopathy, and atrial fibrillation who presents for consultation in the [facility]. Device clinic alert 8/25 for new Afib. Patient has a CHADS2VASC score of 6.  On evaluation today, patient is currently in V paced rhythm. She feels tired and notes swelling in bilateral LE. She takes ASA 81 mg daily.    On follow up 06/12/24, patient is currently in Afib. She notes SOB, weight gain, ankle swelling, and feels unwell. No missed doses of Eliquis  since 8/27.   Today, she denies symptoms of palpitations, chest pain, orthopnea, PND, dizziness, presyncope, syncope, bleeding, or neurologic sequela. The patient is tolerating medications without difficulties and is otherwise without complaint today.    Atrial Fibrillation Risk Factors:  she does have symptoms or diagnosis of sleep apnea. she is compliant with CPAP therapy.   she has a BMI of Body mass index is 34.35 kg/m.[name] Filed Weights   06/12/24 1517  Weight: 93.6 kg     Current Outpatient Medications  Medication Sig Dispense Refill   acetaminophen  (TYLENOL ) 500 MG tablet Take 1,000 mg by mouth every 6 (six) hours as needed for moderate pain. (Patient taking differently: Take 1,000 mg by mouth as needed for moderate pain (pain score 4-6).)     albuterol  (PROVENTIL ) (2.5 MG/3ML) 0.083% nebulizer solution INHALE THE CONTENTS OF 1 VIAL VIA NEBULIZER EVERY 6 HOURS AS NEEDED FOR SHORTNESS OF BREATH OR WHEEZING 360 mL 11   apixaban  (ELIQUIS ) 5 MG TABS tablet Take 1 tablet (5 mg total) by mouth 2 (two) times daily. 60 tablet 3   atorvastatin  (LIPITOR ) 80 MG tablet TAKE 1 TABLET EVERY DAY 90 tablet 3   blood glucose meter kit  and supplies Dispense based on patient and insurance preference. Use up to four times daily as directed.(FOR E11.40). 1 each 0   calcium  carbonate (OSCAL) 1500 (600 Ca) MG TABS tablet Take 1 tablet by mouth daily with breakfast.     carvedilol  (COREG ) 6.25 MG tablet Take 1 tablet (6.25 mg total) by mouth 2 (two) times daily. 180 tablet 3   empagliflozin  (JARDIANCE ) 10 MG TABS tablet Take 1 tablet (10 mg total) by mouth daily before breakfast. 30 tablet 11   furosemide  (LASIX ) 20 MG tablet TAKE 1-2 TABLETS (20-40MG  TOTAL) DAILY AS DIRECTED. ALTERNATE 40MG  WITH 20MG  DAILY (DIRECTIONS CHANGE) 135 tablet 1   glipiZIDE  (GLUCOTROL ) 5 MG tablet Take 1 tablet (5 mg total) by mouth 2 (two) times daily before a meal. 60 tablet 3   Glucagon  (GVOKE HYPOPEN  2-PACK) 1 MG/0.2ML SOAJ Inject 1 mg into the skin as needed (Blood glucose < 53). 0.4 mL 5   Insulin  Pen Needle 31G X 5 MM MISC Use as directed for Lantus  and Novolog . 100 each 5   LANTUS  SOLOSTAR 100 UNIT/ML Solostar Pen INJECT 24 UNITS INTO THE SKIN 2 (TWO) TIMES DAILY. 45 mL 3   levothyroxine  (SYNTHROID ) 50 MCG tablet TAKE 1 TABLET EVERY DAY BEFORE BREAKFAST 90 tablet 3   losartan  (COZAAR ) 25 MG tablet TAKE 1 TABLET EVERY DAY 90 tablet 3   Multiple Vitamin (MULTIVITAMIN) tablet Take 1 tablet by mouth daily.     polyethylene glycol (MIRALAX  / GLYCOLAX ) packet Take 17  g by mouth daily as needed for mild constipation.     Semaglutide , 1 MG/DOSE, (OZEMPIC , 1 MG/DOSE,) 4 MG/3ML SOPN INJECT 1MG  UNDER THE SKIN ONE TIME WEEKLY AS DIRECTED 9 mL 3   spironolactone  (ALDACTONE ) 25 MG tablet TAKE 1 TABLET EVERY DAY 90 tablet 3   TRUE METRIX BLOOD GLUCOSE TEST test strip TEST BLOOD SUGAR UP TO FOUR TIMES DAILY AS DIRECTED 400 strip 3   TRUEplus Lancets 33G MISC USE AS DIRECTED TO TEST BLOOD SUGAR UP TO FOUR TIMES DAILY 400 each 3   potassium chloride  (KLOR-CON ) 10 MEQ tablet Take 1 tablet (10 mEq total) by mouth daily. 30 tablet 6   No current facility-administered  medications for this encounter.    Atrial Fibrillation Management history:  Previous antiarrhythmic drugs: none Previous cardioversions: none Previous ablations: none Anticoagulation history: Eliquis    ROS- All systems are reviewed and negative except as per the HPI above.  Physical Exam: BP (!) 76/58   Pulse 96   Ht 5' 5 (1.651 m)   Wt 93.6 kg   BMI 34.35 kg/m   GEN- The patient is well appearing, alert and oriented x 3 today.   Neck - no JVD or carotid bruit noted Lungs- Crackles at bases bilaterally Heart- Irregular rate and rhythm, no murmurs, rubs or gallops, PMI not laterally displaced Extremities- +2 pitting edema bilaterally LE Skin - no rash or ecchymosis noted   EKG today demonstrates  Vent. rate 96 BPM PR interval 96 ms QRS duration 108 ms QT/QTcB 426/538 ms P-R-T axes * 129 -51 Atrial fibrillation with occasional Premature ventricular complexes Right axis deviation Possible Right ventricular hypertrophy Septal infarct , age undetermined ST & T wave abnormality, consider inferior ischemia Abnormal ECG When compared with ECG of 15-May-2024 13:21, Previous ECG Confirmed by Terra Pac [phone]) on 06/12/2024 3:38:06 PM  Echo 02/01/22 demonstrated  1. Left ventricular ejection fraction, by estimation, is 40 to 45%. The  left ventricle has mildly decreased function. The left ventricle  demonstrates global hypokinesis. There is mild left ventricular  hypertrophy. Left ventricular diastolic parameters  are consistent with Grade I diastolic dysfunction (impaired relaxation).  Elevated left atrial pressure.   2. Right ventricular systolic function is normal. The right ventricular  size is normal. Tricuspid regurgitation signal is inadequate for assessing  PA pressure.   3. The mitral valve is normal in structure. No evidence of mitral valve  regurgitation. No evidence of mitral stenosis.   4. The aortic valve has an indeterminant number of cusps. Aortic valve   regurgitation is not visualized. No aortic stenosis is present.   ASSESSMENT & PLAN CHA2DS2-VASc Score = 6  The patient's score is based upon: CHF History: 1 HTN History: 1 Diabetes History: 0 Stroke History: 0 Vascular Disease History: 1 Age Score: 2 Gender Score: 1       ASSESSMENT AND PLAN: Persistent Atrial Fibrillation (ICD10:  I48.0) The patient's CHA2DS2-VASc score is 6, indicating a 9.7% annual risk of stroke.    Patient is currently in Afib. Continue coreg  6.25 mg BID. No missed doses of OAC. We discussed the procedure cardioversion to try to convert to NSR. We discussed the risks vs benefits of this procedure and how ultimately we cannot predict whether a patient will have early return of arrhythmia post procedure. After discussion, the patient wishes to proceed with cardioversion. Labs drawn today. Patient took Ozempic  on Monday, will hold through cardioversion. Patient will have to hold Jardiance  3 days prior to procedure. ED precautions  discussed since patient has to wait on week due to Ozempic . If she notes chest pain or progressive worsening of SOB to go to ED for evaluation.  Informed Consent   Shared Decision Making/Informed Consent The risks (stroke, cardiac arrhythmias rarely resulting in the need for a temporary or permanent pacemaker, skin irritation or burns and complications associated with conscious sedation including aspiration, arrhythmia, respiratory failure and death), benefits (restoration of normal sinus rhythm) and alternatives of a direct current cardioversion were explained in detail to Ms. Trentham and she agrees to proceed.      Secondary Hypercoagulable State (ICD10:  D68.69) The patient is at significant risk for stroke/thromboembolism based upon her CHA2DS2-VASc Score of 6.  Continue Apixaban  (Eliquis ).  No missed doses of Eliquis .   Chronic systolic CHF Weight gain of 85 kg on 8/26 to 93.6 kg today. Increase lasix  to 80 mg x 3 days, then lasix  40  mg daily through cardioversion. Start potassium 10 meq daily, will monitor K closely as patient is on spironolactone . Hold losartan  for marked hypotension noted today.    Follow up 2 weeks after DCCV.   I spent a total of 40 minutes with this patient today face to face. We discussed cardioversion as a procedure to restore normal rhythm. I explained the possible risks vs benefits of procedure. I discussed ED/hospital admission if symptoms worsen while waiting for cardioversion. I answered multiple questions from patient and son during this time and reviewed her ECG and history during the visit.      Terra Pac, PA-C  [facility] [address] [place], [place] [address] [phone]

## 2024-06-12 NOTE — Patient Instructions (Addendum)
 Hold LOSARTAN  until follow up appointment  Increase Lasix  to 40 mg twice a day for 3 days then decrease to 40 mg a day  Start potassium 10 meq once daily  Hold Jardiance  06/15/24 then resume after procedure  Hold Ozempic  resume after procedure    Cardioversion scheduled for: 06/19/24 8:00am    - Arrive at the [employer] [facility] ([address]  and check in with ADMITTING at 8:00 am    - Do not eat or drink anything after midnight the night prior to your procedure.   - Take all your morning medication (except diabetic medications) with a sip of water  prior to arrival.  - Do NOT miss any doses of your blood thinner - if you should miss a dose or take a dose more than 4 hours late -- please notify our office immediately.  - You will not be able to drive home after your procedure. Please ensure you have a responsible adult to drive you home. You will need someone with you for 24 hours post procedure.     - Expect to be in the procedural area approximately 2 hours.   - If you feel as if you go back into normal rhythm prior to scheduled cardioversion, please notify our office immediately.   If your procedure is canceled in the cardioversion suite you will be charged a cancellation fee.    Hold below medications 7 days prior to scheduled procedure/anesthesia.  Restart medication on the normal dosing day after scheduled procedure/anesthesia  Semaglutide  (Ozempic ) ([place] )    Hold below medications 72 hours prior to scheduled procedure/anesthesia. Restart medication on the following day after scheduled procedure/anesthesia Empagliflozin  (Jardiance )   For those patients who have a scheduled procedure/anesthesia on the same day of the week as their dose, hold the medication on the day of surgery.  They can take their scheduled dose the week before.  **Patients on the above medications scheduled for elective procedures that have not held the medication  for the appropriate amount of time are at risk of cancellation or change in the anesthetic plan.

## 2024-06-13 LAB — CBC
Hematocrit: 37.1 % (ref 34.0–46.6)
Hemoglobin: 12 g/dL (ref 11.1–15.9)
MCH: 28.8 pg (ref 26.6–33.0)
MCHC: 32.3 g/dL (ref 31.5–35.7)
MCV: 89 fL (ref 79–97)
Platelets: 214 x10E3/uL (ref 150–450)
RBC: 4.17 x10E6/uL (ref 3.77–5.28)
RDW: 20 % — ABNORMAL HIGH (ref 11.7–15.4)
WBC: 4.5 x10E3/uL (ref 3.4–10.8)

## 2024-06-13 LAB — BASIC METABOLIC PANEL WITH GFR
BUN/Creatinine Ratio: 25 (ref 12–28)
BUN: 36 mg/dL — ABNORMAL HIGH (ref 8–27)
CO2: 18 mmol/L — ABNORMAL LOW (ref 20–29)
Calcium: 9.1 mg/dL (ref 8.7–10.3)
Chloride: 104 mmol/L (ref 96–106)
Creatinine, Ser: 1.45 mg/dL — ABNORMAL HIGH (ref 0.57–1.00)
Glucose: 115 mg/dL — ABNORMAL HIGH (ref 70–99)
Potassium: 4.5 mmol/L (ref 3.5–5.2)
Sodium: 138 mmol/L (ref 134–144)
eGFR: 38 mL/min/1.73 — ABNORMAL LOW (ref >59)

## 2024-06-13 NOTE — Progress Notes (Signed)
Remote ICD Transmission.

## 2024-06-14 ENCOUNTER — Ambulatory Visit (HOSPITAL_COMMUNITY): Payer: Self-pay | Admitting: Internal Medicine

## 2024-06-17 ENCOUNTER — Encounter (HOSPITAL_COMMUNITY): Payer: Self-pay

## 2024-06-17 ENCOUNTER — Emergency Department (HOSPITAL_COMMUNITY)

## 2024-06-17 ENCOUNTER — Other Ambulatory Visit: Payer: Self-pay

## 2024-06-17 ENCOUNTER — Inpatient Hospital Stay (HOSPITAL_COMMUNITY)
Admission: EM | Admit: 2024-06-17 | Discharge: 2024-07-21 | DRG: 286 | Disposition: E | Attending: Internal Medicine | Admitting: Internal Medicine

## 2024-06-17 DIAGNOSIS — Z7189 Other specified counseling: Secondary | ICD-10-CM | POA: Diagnosis not present

## 2024-06-17 DIAGNOSIS — I110 Hypertensive heart disease with heart failure: Secondary | ICD-10-CM | POA: Diagnosis not present

## 2024-06-17 DIAGNOSIS — I5082 Biventricular heart failure: Secondary | ICD-10-CM | POA: Diagnosis present

## 2024-06-17 DIAGNOSIS — Z7984 Long term (current) use of oral hypoglycemic drugs: Secondary | ICD-10-CM

## 2024-06-17 DIAGNOSIS — D649 Anemia, unspecified: Secondary | ICD-10-CM | POA: Diagnosis not present

## 2024-06-17 DIAGNOSIS — I132 Hypertensive heart and chronic kidney disease with heart failure and with stage 5 chronic kidney disease, or end stage renal disease: Secondary | ICD-10-CM | POA: Diagnosis not present

## 2024-06-17 DIAGNOSIS — R079 Chest pain, unspecified: Secondary | ICD-10-CM | POA: Diagnosis not present

## 2024-06-17 DIAGNOSIS — Z1152 Encounter for screening for COVID-19: Secondary | ICD-10-CM | POA: Diagnosis not present

## 2024-06-17 DIAGNOSIS — Z9081 Acquired absence of spleen: Secondary | ICD-10-CM

## 2024-06-17 DIAGNOSIS — R0902 Hypoxemia: Secondary | ICD-10-CM

## 2024-06-17 DIAGNOSIS — R531 Weakness: Secondary | ICD-10-CM | POA: Diagnosis not present

## 2024-06-17 DIAGNOSIS — J90 Pleural effusion, not elsewhere classified: Secondary | ICD-10-CM | POA: Diagnosis not present

## 2024-06-17 DIAGNOSIS — I081 Rheumatic disorders of both mitral and tricuspid valves: Secondary | ICD-10-CM | POA: Diagnosis present

## 2024-06-17 DIAGNOSIS — I2510 Atherosclerotic heart disease of native coronary artery without angina pectoris: Secondary | ICD-10-CM | POA: Diagnosis present

## 2024-06-17 DIAGNOSIS — Z66 Do not resuscitate: Secondary | ICD-10-CM | POA: Diagnosis not present

## 2024-06-17 DIAGNOSIS — R570 Cardiogenic shock: Secondary | ICD-10-CM | POA: Diagnosis not present

## 2024-06-17 DIAGNOSIS — N170 Acute kidney failure with tubular necrosis: Secondary | ICD-10-CM | POA: Diagnosis not present

## 2024-06-17 DIAGNOSIS — E7849 Other hyperlipidemia: Secondary | ICD-10-CM | POA: Diagnosis present

## 2024-06-17 DIAGNOSIS — Z7901 Long term (current) use of anticoagulants: Secondary | ICD-10-CM

## 2024-06-17 DIAGNOSIS — Z23 Encounter for immunization: Secondary | ICD-10-CM | POA: Diagnosis not present

## 2024-06-17 DIAGNOSIS — I5022 Chronic systolic (congestive) heart failure: Secondary | ICD-10-CM | POA: Diagnosis not present

## 2024-06-17 DIAGNOSIS — I428 Other cardiomyopathies: Secondary | ICD-10-CM | POA: Diagnosis present

## 2024-06-17 DIAGNOSIS — I480 Paroxysmal atrial fibrillation: Secondary | ICD-10-CM | POA: Diagnosis not present

## 2024-06-17 DIAGNOSIS — L89151 Pressure ulcer of sacral region, stage 1: Secondary | ICD-10-CM | POA: Diagnosis not present

## 2024-06-17 DIAGNOSIS — I959 Hypotension, unspecified: Secondary | ICD-10-CM | POA: Diagnosis not present

## 2024-06-17 DIAGNOSIS — E440 Moderate protein-calorie malnutrition: Secondary | ICD-10-CM | POA: Diagnosis present

## 2024-06-17 DIAGNOSIS — J9601 Acute respiratory failure with hypoxia: Secondary | ICD-10-CM | POA: Diagnosis present

## 2024-06-17 DIAGNOSIS — I361 Nonrheumatic tricuspid (valve) insufficiency: Secondary | ICD-10-CM | POA: Diagnosis not present

## 2024-06-17 DIAGNOSIS — I10 Essential (primary) hypertension: Secondary | ICD-10-CM | POA: Diagnosis present

## 2024-06-17 DIAGNOSIS — I5043 Acute on chronic combined systolic (congestive) and diastolic (congestive) heart failure: Secondary | ICD-10-CM | POA: Diagnosis not present

## 2024-06-17 DIAGNOSIS — I4819 Other persistent atrial fibrillation: Secondary | ICD-10-CM | POA: Diagnosis not present

## 2024-06-17 DIAGNOSIS — Z515 Encounter for palliative care: Secondary | ICD-10-CM | POA: Diagnosis not present

## 2024-06-17 DIAGNOSIS — Z79899 Other long term (current) drug therapy: Secondary | ICD-10-CM

## 2024-06-17 DIAGNOSIS — E039 Hypothyroidism, unspecified: Secondary | ICD-10-CM | POA: Diagnosis present

## 2024-06-17 DIAGNOSIS — R0989 Other specified symptoms and signs involving the circulatory and respiratory systems: Secondary | ICD-10-CM | POA: Diagnosis not present

## 2024-06-17 DIAGNOSIS — N1832 Chronic kidney disease, stage 3b: Secondary | ICD-10-CM

## 2024-06-17 DIAGNOSIS — R0602 Shortness of breath: Secondary | ICD-10-CM | POA: Diagnosis not present

## 2024-06-17 DIAGNOSIS — Z6831 Body mass index (BMI) 31.0-31.9, adult: Secondary | ICD-10-CM | POA: Diagnosis not present

## 2024-06-17 DIAGNOSIS — K7460 Unspecified cirrhosis of liver: Secondary | ICD-10-CM | POA: Diagnosis present

## 2024-06-17 DIAGNOSIS — R0682 Tachypnea, not elsewhere classified: Secondary | ICD-10-CM | POA: Diagnosis not present

## 2024-06-17 DIAGNOSIS — R0789 Other chest pain: Secondary | ICD-10-CM | POA: Diagnosis not present

## 2024-06-17 DIAGNOSIS — D631 Anemia in chronic kidney disease: Secondary | ICD-10-CM | POA: Diagnosis present

## 2024-06-17 DIAGNOSIS — N186 End stage renal disease: Secondary | ICD-10-CM | POA: Diagnosis present

## 2024-06-17 DIAGNOSIS — R34 Anuria and oliguria: Secondary | ICD-10-CM | POA: Diagnosis present

## 2024-06-17 DIAGNOSIS — R188 Other ascites: Secondary | ICD-10-CM | POA: Diagnosis not present

## 2024-06-17 DIAGNOSIS — E1165 Type 2 diabetes mellitus with hyperglycemia: Secondary | ICD-10-CM | POA: Diagnosis present

## 2024-06-17 DIAGNOSIS — G4733 Obstructive sleep apnea (adult) (pediatric): Secondary | ICD-10-CM | POA: Diagnosis present

## 2024-06-17 DIAGNOSIS — D696 Thrombocytopenia, unspecified: Secondary | ICD-10-CM | POA: Diagnosis present

## 2024-06-17 DIAGNOSIS — Z7409 Other reduced mobility: Secondary | ICD-10-CM | POA: Diagnosis present

## 2024-06-17 DIAGNOSIS — I4891 Unspecified atrial fibrillation: Secondary | ICD-10-CM | POA: Diagnosis not present

## 2024-06-17 DIAGNOSIS — Z833 Family history of diabetes mellitus: Secondary | ICD-10-CM

## 2024-06-17 DIAGNOSIS — I509 Heart failure, unspecified: Principal | ICD-10-CM

## 2024-06-17 DIAGNOSIS — Z751 Person awaiting admission to adequate facility elsewhere: Secondary | ICD-10-CM

## 2024-06-17 DIAGNOSIS — L8990 Pressure ulcer of unspecified site, unspecified stage: Secondary | ICD-10-CM

## 2024-06-17 DIAGNOSIS — E1122 Type 2 diabetes mellitus with diabetic chronic kidney disease: Secondary | ICD-10-CM | POA: Diagnosis present

## 2024-06-17 DIAGNOSIS — I152 Hypertension secondary to endocrine disorders: Secondary | ICD-10-CM | POA: Diagnosis present

## 2024-06-17 DIAGNOSIS — L89101 Pressure ulcer of unspecified part of back, stage 1: Secondary | ICD-10-CM | POA: Diagnosis not present

## 2024-06-17 DIAGNOSIS — E876 Hypokalemia: Secondary | ICD-10-CM | POA: Diagnosis not present

## 2024-06-17 DIAGNOSIS — Z6830 Body mass index (BMI) 30.0-30.9, adult: Secondary | ICD-10-CM

## 2024-06-17 DIAGNOSIS — I517 Cardiomegaly: Secondary | ICD-10-CM | POA: Diagnosis not present

## 2024-06-17 DIAGNOSIS — I2781 Cor pulmonale (chronic): Secondary | ICD-10-CM | POA: Diagnosis present

## 2024-06-17 DIAGNOSIS — E871 Hypo-osmolality and hyponatremia: Secondary | ICD-10-CM | POA: Diagnosis not present

## 2024-06-17 DIAGNOSIS — E1169 Type 2 diabetes mellitus with other specified complication: Secondary | ICD-10-CM | POA: Diagnosis not present

## 2024-06-17 DIAGNOSIS — I5023 Acute on chronic systolic (congestive) heart failure: Secondary | ICD-10-CM | POA: Diagnosis not present

## 2024-06-17 DIAGNOSIS — J181 Lobar pneumonia, unspecified organism: Secondary | ICD-10-CM | POA: Diagnosis present

## 2024-06-17 DIAGNOSIS — J168 Pneumonia due to other specified infectious organisms: Secondary | ICD-10-CM | POA: Diagnosis not present

## 2024-06-17 DIAGNOSIS — E1140 Type 2 diabetes mellitus with diabetic neuropathy, unspecified: Secondary | ICD-10-CM | POA: Diagnosis present

## 2024-06-17 DIAGNOSIS — E8779 Other fluid overload: Secondary | ICD-10-CM | POA: Diagnosis not present

## 2024-06-17 DIAGNOSIS — J189 Pneumonia, unspecified organism: Secondary | ICD-10-CM | POA: Diagnosis not present

## 2024-06-17 DIAGNOSIS — Z9109 Other allergy status, other than to drugs and biological substances: Secondary | ICD-10-CM

## 2024-06-17 DIAGNOSIS — Z992 Dependence on renal dialysis: Secondary | ICD-10-CM | POA: Diagnosis not present

## 2024-06-17 DIAGNOSIS — Z8249 Family history of ischemic heart disease and other diseases of the circulatory system: Secondary | ICD-10-CM

## 2024-06-17 DIAGNOSIS — R54 Age-related physical debility: Secondary | ICD-10-CM | POA: Diagnosis present

## 2024-06-17 DIAGNOSIS — E1159 Type 2 diabetes mellitus with other circulatory complications: Secondary | ICD-10-CM | POA: Diagnosis present

## 2024-06-17 DIAGNOSIS — N189 Chronic kidney disease, unspecified: Secondary | ICD-10-CM | POA: Diagnosis not present

## 2024-06-17 DIAGNOSIS — Z794 Long term (current) use of insulin: Secondary | ICD-10-CM

## 2024-06-17 DIAGNOSIS — I447 Left bundle-branch block, unspecified: Secondary | ICD-10-CM | POA: Diagnosis present

## 2024-06-17 DIAGNOSIS — E66811 Obesity, class 1: Secondary | ICD-10-CM | POA: Diagnosis present

## 2024-06-17 DIAGNOSIS — Z888 Allergy status to other drugs, medicaments and biological substances status: Secondary | ICD-10-CM

## 2024-06-17 DIAGNOSIS — N939 Abnormal uterine and vaginal bleeding, unspecified: Secondary | ICD-10-CM | POA: Diagnosis not present

## 2024-06-17 DIAGNOSIS — Z87891 Personal history of nicotine dependence: Secondary | ICD-10-CM

## 2024-06-17 DIAGNOSIS — E872 Acidosis, unspecified: Secondary | ICD-10-CM | POA: Diagnosis present

## 2024-06-17 DIAGNOSIS — N179 Acute kidney failure, unspecified: Secondary | ICD-10-CM | POA: Diagnosis not present

## 2024-06-17 DIAGNOSIS — I340 Nonrheumatic mitral (valve) insufficiency: Secondary | ICD-10-CM | POA: Diagnosis not present

## 2024-06-17 DIAGNOSIS — N1831 Chronic kidney disease, stage 3a: Secondary | ICD-10-CM | POA: Diagnosis not present

## 2024-06-17 DIAGNOSIS — Z452 Encounter for adjustment and management of vascular access device: Secondary | ICD-10-CM | POA: Diagnosis not present

## 2024-06-17 DIAGNOSIS — Z95810 Presence of automatic (implantable) cardiac defibrillator: Secondary | ICD-10-CM

## 2024-06-17 DIAGNOSIS — E785 Hyperlipidemia, unspecified: Secondary | ICD-10-CM | POA: Diagnosis not present

## 2024-06-17 DIAGNOSIS — R918 Other nonspecific abnormal finding of lung field: Secondary | ICD-10-CM | POA: Diagnosis not present

## 2024-06-17 DIAGNOSIS — E875 Hyperkalemia: Secondary | ICD-10-CM | POA: Diagnosis present

## 2024-06-17 DIAGNOSIS — Z79890 Hormone replacement therapy (postmenopausal): Secondary | ICD-10-CM

## 2024-06-17 DIAGNOSIS — Z885 Allergy status to narcotic agent status: Secondary | ICD-10-CM

## 2024-06-17 LAB — COMPREHENSIVE METABOLIC PANEL WITH GFR
ALT: 12 U/L (ref 0–44)
AST: 14 U/L — ABNORMAL LOW (ref 15–41)
Albumin: 3 g/dL — ABNORMAL LOW (ref 3.5–5.0)
Alkaline Phosphatase: 41 U/L (ref 38–126)
Anion gap: 13 (ref 5–15)
BUN: 45 mg/dL — ABNORMAL HIGH (ref 8–23)
CO2: 17 mmol/L — ABNORMAL LOW (ref 22–32)
Calcium: 8.7 mg/dL — ABNORMAL LOW (ref 8.9–10.3)
Chloride: 105 mmol/L (ref 98–111)
Creatinine, Ser: 1.59 mg/dL — ABNORMAL HIGH (ref 0.44–1.00)
GFR, Estimated: 34 mL/min — ABNORMAL LOW (ref >60)
Glucose, Bld: 174 mg/dL — ABNORMAL HIGH (ref 70–99)
Potassium: 4.5 mmol/L (ref 3.5–5.1)
Sodium: 135 mmol/L (ref 135–145)
Total Bilirubin: 1.1 mg/dL (ref 0.0–1.2)
Total Protein: 6.8 g/dL (ref 6.5–8.1)

## 2024-06-17 LAB — CBC
HCT: 35.5 % — ABNORMAL LOW (ref 36.0–46.0)
Hemoglobin: 11.9 g/dL — ABNORMAL LOW (ref 12.0–15.0)
MCH: 27.9 pg (ref 26.0–34.0)
MCHC: 33.5 g/dL (ref 30.0–36.0)
MCV: 83.3 fL (ref 80.0–100.0)
Platelets: 110 K/uL — ABNORMAL LOW (ref 150–400)
RBC: 4.26 MIL/uL (ref 3.87–5.11)
RDW: 18.8 % — ABNORMAL HIGH (ref 11.5–15.5)
WBC: 4.5 K/uL (ref 4.0–10.5)
nRBC: 0 % (ref 0.0–0.2)

## 2024-06-17 LAB — I-STAT CHEM 8, ED
BUN: 52 mg/dL — ABNORMAL HIGH (ref 8–23)
Calcium, Ion: 1.15 mmol/L (ref 1.15–1.40)
Chloride: 105 mmol/L (ref 98–111)
Creatinine, Ser: 1.7 mg/dL — ABNORMAL HIGH (ref 0.44–1.00)
Glucose, Bld: 164 mg/dL — ABNORMAL HIGH (ref 70–99)
HCT: 40 % (ref 36.0–46.0)
Hemoglobin: 13.6 g/dL (ref 12.0–15.0)
Potassium: 4.7 mmol/L (ref 3.5–5.1)
Sodium: 138 mmol/L (ref 135–145)
TCO2: 22 mmol/L (ref 22–32)

## 2024-06-17 LAB — TROPONIN I (HIGH SENSITIVITY)
Troponin I (High Sensitivity): 35 ng/L — ABNORMAL HIGH (ref <18)
Troponin I (High Sensitivity): 35 ng/L — ABNORMAL HIGH (ref <18)

## 2024-06-17 LAB — RESP PANEL BY RT-PCR (RSV, FLU A&B, COVID)  RVPGX2
Influenza A by PCR: NEGATIVE
Influenza B by PCR: NEGATIVE
Resp Syncytial Virus by PCR: NEGATIVE
SARS Coronavirus 2 by RT PCR: NEGATIVE

## 2024-06-17 LAB — BRAIN NATRIURETIC PEPTIDE: B Natriuretic Peptide: 1998.5 pg/mL — ABNORMAL HIGH (ref 0.0–100.0)

## 2024-06-17 MED ORDER — FUROSEMIDE 10 MG/ML IJ SOLN
40.0000 mg | Freq: Once | INTRAMUSCULAR | Status: AC
  Administered 2024-06-17: 40 mg via INTRAVENOUS
  Filled 2024-06-17: qty 4

## 2024-06-17 MED ORDER — SODIUM CHLORIDE 0.9 % IV SOLN
500.0000 mg | Freq: Once | INTRAVENOUS | Status: AC
  Administered 2024-06-17: 500 mg via INTRAVENOUS
  Filled 2024-06-17: qty 5

## 2024-06-17 MED ORDER — FENTANYL CITRATE PF 50 MCG/ML IJ SOSY
25.0000 ug | PREFILLED_SYRINGE | Freq: Once | INTRAMUSCULAR | Status: AC
  Administered 2024-06-17: 25 ug via INTRAVENOUS
  Filled 2024-06-17: qty 1

## 2024-06-17 MED ORDER — ASPIRIN 81 MG PO CHEW
162.0000 mg | CHEWABLE_TABLET | Freq: Once | ORAL | Status: AC
  Administered 2024-06-17: 162 mg via ORAL
  Filled 2024-06-17: qty 2

## 2024-06-17 MED ORDER — ACETAMINOPHEN 500 MG PO TABS
1000.0000 mg | ORAL_TABLET | ORAL | Status: AC
  Administered 2024-06-17: 1000 mg via ORAL
  Filled 2024-06-17: qty 2

## 2024-06-17 MED ORDER — IOHEXOL 350 MG/ML SOLN
75.0000 mL | Freq: Once | INTRAVENOUS | Status: AC | PRN
Start: 2024-06-17 — End: 2024-06-17
  Administered 2024-06-17: 75 mL via INTRAVENOUS

## 2024-06-17 MED ORDER — SODIUM CHLORIDE 0.9 % IV SOLN
2.0000 g | Freq: Once | INTRAVENOUS | Status: AC
  Administered 2024-06-17: 2 g via INTRAVENOUS
  Filled 2024-06-17: qty 20

## 2024-06-17 MED ORDER — ONDANSETRON HCL 4 MG/2ML IJ SOLN
4.0000 mg | Freq: Once | INTRAMUSCULAR | Status: AC
  Administered 2024-06-17: 4 mg via INTRAVENOUS
  Filled 2024-06-17: qty 2

## 2024-06-17 MED ORDER — FUROSEMIDE 10 MG/ML IJ SOLN: 40.0000 mg | Freq: Two times a day (BID) | INTRAMUSCULAR | Status: DC

## 2024-06-17 NOTE — H&P (Signed)
 History and Physical    [name] FMW:[id number] DOB: 08-03-1951 DOA: 06/17/2024  PCP: Tobie Suzzane POUR, MD  Patient coming from: Home  I have personally briefly reviewed patient's old medical records in [facility]  Chief Complaint: Chest pain, dyspnea, cough  HPI: [name] is a 73 y.o. female with medical history significant for CAD, persistent atrial fibrillation on Eliquis , chronic HFmrEF s/p CRT-D, LBBB, T2DM, HTN, HLD, CKD stage IIIa, hypothyroidism, OSA who presented to the ED for evaluation of chest pain, dyspnea, cough.  Patient reports developing new onset of nonradiating central chest discomfort morning of 9/28.  This was associated with shortness of breath and orthopnea.  She has had new cough occasionally productive of clear sputum.  She reports increased swelling to both lower extremities.  She also reports some nausea and vomiting and suspect she might have had an aspiration event during one of these episodes.  Patient was seen in the A-fib clinic on 9/23.  She was felt to be hypervolemic and was told to double her Lasix  dose to 80 mg for 3 days and then decrease to 40 mg daily.  Patient states that despite the increased Lasix  dose she has had less urine output than expected.  ED Course  Labs/Imaging on admission: I have personally reviewed following labs and imaging studies.  Initial vitals showed BP 94/71, pulse 97, RR 18, temp 97.7 F, SpO2 97% on room air.  Labs showed WBC 4.5, hemoglobin 11.9, platelets 110, sodium 135, potassium 4.5, bicarb 17, BUN 45, creatinine 1.59, serum glucose 174, BNP 1998, troponin 35 x 2.  SARS-CoV-2, influenza, RSV PCR negative.  Blood cultures collected and pending.  CTA chest negative for PE.  Cardiomegaly without pericardial effusion.  Right greater than left bilateral pleural effusions noted.  Bibasilar bronchial wall thickening, patchy right basilar consolidation, coronary and aortic atherosclerosis, and cirrhotic  morphology of the liver noted.  Patient was given IV Lasix  40 mg, IV ceftriaxone  and azithromycin , aspirin  162 mg.  Cardiology were consulted and recommended medical admission.  The hospitalist service was consulted for admission.  Review of Systems: All systems reviewed and are negative except as documented in history of present illness above.   Past Medical History:  Diagnosis Date   A-fib (HCC) 06/12/2024   Anemia    Arthritis    CAD (coronary artery disease)    a. LHC (03/2014) Lmain: nl, LAD: diff dz proximal 20%, 30-40% dz mid vessel prior 2nd diagonal, LCx: 40% in OM1, 20-30% in OM2, RCA: 99% subtotal occlusion @ crux, TIMI 1 flow, L-R collaterals to distal vessel   Cardiomyopathy (HCC) 03/28/2014   LV dysfunction out of proportion to CAD 03/28/14   CHF (congestive heart failure) (HCC)    Phreesia 10/03/2020   Chronic systolic heart failure (HCC)    a. ECHO (03/2014): EF 25-30%, akinesis enteroanteroseptal myocardium, grade III DD b. RHC (03/2014) RA 4, RV 42/4, PA 44/14 (26), PCWP 21, PA 62% Fick CO/CI 6.9 / 3.4   Diabetes mellitus    Diabetes mellitus without complication (HCC)    Phreesia 10/03/2020   Duodenal ulcer    Age 64   Dyspnea    Erosive esophagitis    Gastritis    Hypertension    Hypothyroidism    Obese    Short-term memory loss    Thyroid  disease    TTP (thrombotic thrombocytopenic purpura) (HCC) 06/2005    Past Surgical History:  Procedure Laterality Date   BIV ICD INSERTION CRT-D N/A  05/03/2019   Procedure: BIV ICD INSERTION CRT-D;  Surgeon: Kelsie Agent, MD;  Location: [facility] INVASIVE CV LAB;  Service: Cardiovascular;  Laterality: N/A;   CARDIAC CATHETERIZATION     COLONOSCOPY  08/29/2012   MFM:Rnonwpr diverticulosis   COLONOSCOPY N/A 05/28/2015   Procedure: COLONOSCOPY;  Surgeon: Lamar CHRISTELLA Hollingshead, MD;  Location: [facility] ENDO SUITE;  Service: Endoscopy;  Laterality: N/A;  1015   ECTOPIC PREGNANCY SURGERY     ESOPHAGOGASTRODUODENOSCOPY  04/20/2012   WLM:Zmndpcz  antral gastritis/Erosive reflux esophagitis   LEFT AND RIGHT HEART CATHETERIZATION WITH CORONARY ANGIOGRAM N/A 03/28/2014   Procedure: LEFT AND RIGHT HEART CATHETERIZATION WITH CORONARY ANGIOGRAM;  Surgeon: Peter M Swaziland, MD;  Location: [facility] CATH LAB;  Service: Cardiovascular;  Laterality: N/A;   SPLENECTOMY  07/20/2005    Social History: Social History   Tobacco Use   Smoking status: Former    Current packs/day: 0.00    Average packs/day: 0.3 packs/day for 12.0 years (3.0 ttl pk-yrs)    Types: Cigarettes    Start date: 09/20/1966    Quit date: 09/20/1978    Years since quitting: 45.7   Smokeless tobacco: Never   Tobacco comments:    Former smoker 05/15/24  Vaping Use   Vaping status: Never Used  Substance Use Topics   Alcohol use: No    Alcohol/week: 0.0 standard drinks of alcohol   Drug use: No   Allergies  Allergen Reactions   Other Other (See Comments)    FLAGYL , CIPRO , PROTONIX  taken at the same time caused patient to lose her breath and have trouble breathing, requiring hospital stay at [facility], 03/23/14-per patient.    Fentanyl  Nausea Only    Room was spinning prefers not to take it   Lisinopril  Cough    Family History  Problem Relation Age of Onset   Heart failure Mother    Cirrhosis Father        ETOH   Heart disease Brother    Diabetes Daughter    Breast cancer Maternal Aunt    Colon cancer Neg Hx      Prior to Admission medications   Medication Sig Start Date End Date Taking? Authorizing Provider  acetaminophen  (TYLENOL ) 500 MG tablet Take 1,000 mg by mouth every 6 (six) hours as needed for moderate pain. Patient taking differently: Take 1,000 mg by mouth as needed for moderate pain (pain score 4-6).   Yes [provider]  albuterol  (PROVENTIL ) (2.5 MG/3ML) 0.083% nebulizer solution INHALE THE CONTENTS OF 1 VIAL VIA NEBULIZER EVERY 6 HOURS AS NEEDED FOR SHORTNESS OF BREATH OR WHEEZING 06/04/24  Yes Tobie Suzzane POUR, MD  apixaban  (ELIQUIS ) 5 MG TABS tablet  Take 1 tablet (5 mg total) by mouth 2 (two) times daily. 05/15/24  Yes Terra Fairy PARAS, PA-C  atorvastatin  (LIPITOR ) 80 MG tablet TAKE 1 TABLET EVERY DAY 03/02/24  Yes Branch, Dorn FALCON, MD  calcium  carbonate (OSCAL) 1500 (600 Ca) MG TABS tablet Take 1 tablet by mouth daily with breakfast.   Yes [provider]  carvedilol  (COREG ) 6.25 MG tablet Take 1 tablet (6.25 mg total) by mouth 2 (two) times daily. 12/22/23  Yes BranchDorn FALCON, MD  furosemide  (LASIX ) 20 MG tablet TAKE 1-2 TABLETS (20-40MG  TOTAL) DAILY AS DIRECTED. ALTERNATE 40MG  WITH 20MG  DAILY (DIRECTIONS CHANGE) 05/16/24  Yes Branch, Dorn FALCON, MD  Glucagon  (GVOKE HYPOPEN  2-PACK) 1 MG/0.2ML SOAJ Inject 1 mg into the skin as needed (Blood glucose < 53). 06/22/23  Yes Tobie Suzzane POUR, MD  LANTUS   SOLOSTAR 100 UNIT/ML Solostar Pen INJECT 24 UNITS INTO THE SKIN 2 (TWO) TIMES DAILY. Patient taking differently: Inject 20 Units into the skin 2 (two) times daily. 12/19/23  Yes Tobie Suzzane POUR, MD  levothyroxine  (SYNTHROID ) 50 MCG tablet TAKE 1 TABLET EVERY DAY BEFORE BREAKFAST 12/12/23  Yes Gianpaolo Mindel, Rutwik K, MD  Multiple Vitamin (MULTIVITAMIN) tablet Take 1 tablet by mouth daily.   Yes [provider]  polyethylene glycol (MIRALAX  / GLYCOLAX ) packet Take 17 g by mouth daily as needed for mild constipation.   Yes [provider]  potassium chloride  (KLOR-CON ) 10 MEQ tablet Take 1 tablet (10 mEq total) by mouth daily. 06/12/24  Yes Terra Fairy PARAS, PA-C  spironolactone  (ALDACTONE ) 25 MG tablet TAKE 1 TABLET EVERY DAY 03/02/24  Yes Branch, Dorn FALCON, MD  blood glucose meter kit and supplies Dispense based on patient and insurance preference. Use up to four times daily as directed.(FOR E11.40). 06/22/23   Tobie Suzzane POUR, MD  empagliflozin  (JARDIANCE ) 10 MG TABS tablet Take 1 tablet (10 mg total) by mouth daily before breakfast. Patient not taking: Reported on 06/17/2024 01/03/24   Alvan Dorn FALCON, MD  glipiZIDE  (GLUCOTROL ) 5 MG  tablet Take 1 tablet (5 mg total) by mouth 2 (two) times daily before a meal. Patient not taking: Reported on 06/17/2024 08/04/23   Tobie Suzzane POUR, MD  Insulin  Pen Needle 31G X 5 MM MISC Use as directed for Lantus  and Novolog . 01/31/23   Tobie Suzzane POUR, MD  losartan  (COZAAR ) 25 MG tablet TAKE 1 TABLET EVERY DAY Patient not taking: Reported on 06/17/2024 03/02/24   Alvan Dorn FALCON, MD  Semaglutide , 1 MG/DOSE, (OZEMPIC , 1 MG/DOSE,) 4 MG/3ML SOPN INJECT 1MG  UNDER THE SKIN ONE TIME WEEKLY AS DIRECTED Patient not taking: Reported on 06/17/2024 05/14/24   Tobie Suzzane POUR, MD  TRUE METRIX BLOOD GLUCOSE TEST test strip TEST BLOOD SUGAR UP TO FOUR TIMES DAILY AS DIRECTED 10/26/23   Tobie Suzzane POUR, MD  TRUEplus Lancets 33G MISC USE AS DIRECTED TO TEST BLOOD SUGAR UP TO FOUR TIMES DAILY 01/09/24   Tobie Suzzane POUR, MD    Physical Exam: Vitals:   06/17/24 2207 06/17/24 2300 06/17/24 2330 06/18/24 0000  BP:  96/66 91/66 90/69   Pulse:  89 87 74  Resp:  18 (!) 27 19  Temp: 97.6 F (36.4 C)     TempSrc: Oral     SpO2:  100% 100% 100%   Constitutional: Early woman resting in bed with head slightly elevated.  NAD, calm, comfortable Eyes: EOMI, lids and conjunctivae normal ENMT: Mucous membranes are moist. Posterior pharynx clear of any exudate or lesions.Normal dentition.  Neck: normal, supple, no masses. Respiratory: Faint inspiratory bibasilar crackles. Normal respiratory effort. No accessory muscle use.  Cardiovascular: Regular rate and rhythm, no murmurs / rubs / gallops.  +2 bilateral lower extremity edema. 2+ pedal pulses.  PPM left upper chest. Abdomen: no tenderness, no masses palpated. Musculoskeletal: no clubbing / cyanosis. No joint deformity upper and lower extremities. Good ROM, no contractures. Normal muscle tone.  Skin: no rashes, lesions, ulcers. No induration Neurologic: Sensation intact. Strength 5/5 in all 4.  Psychiatric: Normal judgment and insight. Alert and oriented x 3. Normal mood.    EKG: Interpretation limited due to artifact throughout.  Assessment/Plan Principal Problem:   Acute on chronic heart failure with mildly reduced ejection fraction (HFmrEF, 41-49%) (HCC) Active Problems:   Consolidation of right lower lobe of lung   Coronary artery disease   Hypertension  associated with diabetes (HCC)   OSA (obstructive sleep apnea)   Hypothyroidism   Hyperlipidemia associated with type 2 diabetes mellitus (HCC)   Type 2 diabetes mellitus with hyperglycemia, with long-term current use of insulin  (HCC)   Persistent atrial fibrillation (HCC)   [name] is a 73 y.o. female with medical history significant for CAD, persistent atrial fibrillation on Eliquis , chronic HFmrEF s/p CRT-D, LBBB, T2DM, HTN, HLD, CKD stage IIIa, hypothyroidism, OSA who is admitted with acute on chronic HFmrEF.  Assessment and Plan: Acute on chronic HFmrEF S/p CRT-D: Patient presenting with central chest discomfort, orthopnea, increased lower extremity edema.  BNP ~ 2000.  CT shows right > left pleural effusions.  BP has been borderline hypotensive. - Cardiology following - Started on IV Lasix  40 mg twice daily - Echocardiogram ordered - Holding spironolactone , losartan , Coreg  - Strict I/O's and daily weights  Right basilar consolidation: CT shows patchy right basilar consolidation and bibasilar bronchial wall thickening.  Suspect patient had aspiration during episode of emesis.  Will place on IV Unasyn.  Persistent atrial fibrillation: Appears to have V-paced rhythm.  Rate is controlled.  Continue Eliquis .  She was scheduled for cardioversion on 9/30.  Cardiology following.  Hypertension: BP has been borderline hypotensive.  Holding Coreg , spironolactone , losartan .  Acute kidney injury superimposed on CKD stage IIIa: Creatinine 1.59 on admission compared to previous baseline ~0.9-1.1.  Suspect cardiorenal process.  Monitor with diuresis.  Holding losartan , spironolactone ,  Jardiance .  Coronary artery disease Hyperlipidemia: Chest discomfort and mild troponin elevation likely from acute on chronic CHF.  Doubt ACS at time of admission.  Continue Eliquis  and atorvastatin .  Type 2 diabetes: Holding home meds.  Placed on SSI w/ HS coverage.  Adjust as necessary.  Hypothyroidism: Continue Synthroid .  Thrombocytopenia: New with platelets 110k on admission.  No obvious bleeding.  Continue to monitor.  Cirrhosis: Noted on CT A/P which shows cirrhosis morphology of the liver with small volume upper abdominal ascites.  OSA: Previously on BiPAP, patient has not been able to get new machine since her old one was recalled.  Will place order for BiPAP nightly while in hospital.   DVT prophylaxis:  apixaban  (ELIQUIS ) tablet 5 mg   Code Status: Full code, confirmed with patient on admission Family Communication: Son, daughter-in-law, grandson at bedside Disposition Plan: From home, dispo pending clinical progress Consults called: Cardiology Severity of Illness: The appropriate patient status for this patient is INPATIENT. Inpatient status is judged to be reasonable and necessary in order to provide the required intensity of service to ensure the patient's safety. The patient's presenting symptoms, physical exam findings, and initial radiographic and laboratory data in the context of their chronic comorbidities is felt to place them at high risk for further clinical deterioration. Furthermore, it is not anticipated that the patient will be medically stable for discharge from the hospital within 2 midnights of admission.   * I certify that at the point of admission it is my clinical judgment that the patient will require inpatient hospital care spanning beyond 2 midnights from the point of admission due to high intensity of service, high risk for further deterioration and high frequency of surveillance required.DEWAINE Jorie Blanch MD [facility]  If 7PM-7AM, please  contact night-coverage [email or web address]  06/18/2024, 12:33 AM

## 2024-06-17 NOTE — Consult Note (Signed)
 Cardiology Consultation   Patient ID: [name] MRN: [id number]; DOB: Jun 11, 1951  Admit date: 06/17/2024 Date of Consult: 06/17/2024  PCP:  Tobie Suzzane POUR, MD   [facility] Providers Cardiologist:  None        Patient Profile: [name] is a 73 y.o. female with a hx of chronic systolic and diastolic heart failure, s/p BIV ICD, CAD, OSA on bipap, HLD, LBBB, HTN, cardiomyopathy, and atrial fibrillation  who is being seen 06/17/2024 for the evaluation of shortness of breath, CHF at the request of Dr. Jakie.  History of Present Illness: [name] is a 73 y.o. female with a hx of chronic systolic and diastolic heart failure, s/p BIV ICD, CAD, OSA on bipap, HLD, LBBB, HTN, cardiomyopathy, and atrial fibrillation  who is being seen 06/17/2024 for the evaluation of shortness of breath, CHF   Patient is a poor historian, family at bedside, reports shortness of breath going on for few weeks, no improvement despite Lasix . Seen by A-fib clinic recently, 06/12/2024, noted weight gain, increase Lasix  to 80 mg for 3 days but no improvement just came to the hospital for ER evaluation. She has been also complaining of chest pain but she cannot explain it further, does report orthopnea, PND and lower extremity edema  In the ER, he was slightly hypoxic needing oxygen, initial blood pressure was low, currently blood pressure is still low normal 96/66 mmHg. Creatinine up to 1.7, baseline is normal. BNP 1998. Chest x-ray shows pulmonary edema and bilateral pleural effusions.  EKG shows V paced rhythm but artifact significant, present before. Last echo was 2023 shows EF 45%, global hypokinesis, no valvular abnormalities       Past Medical History:  Diagnosis Date   A-fib (HCC) 06/12/2024   Anemia    Arthritis    CAD (coronary artery disease)    a. LHC (03/2014) Lmain: nl, LAD: diff dz proximal 20%, 30-40% dz mid vessel prior 2nd diagonal, LCx: 40% in OM1, 20-30% in  OM2, RCA: 99% subtotal occlusion @ crux, TIMI 1 flow, L-R collaterals to distal vessel   Cardiomyopathy (HCC) 03/28/2014   LV dysfunction out of proportion to CAD 03/28/14   CHF (congestive heart failure) (HCC)    Phreesia 10/03/2020   Chronic systolic heart failure (HCC)    a. ECHO (03/2014): EF 25-30%, akinesis enteroanteroseptal myocardium, grade III DD b. RHC (03/2014) RA 4, RV 42/4, PA 44/14 (26), PCWP 21, PA 62% Fick CO/CI 6.9 / 3.4   Diabetes mellitus    Diabetes mellitus without complication (HCC)    Phreesia 10/03/2020   Duodenal ulcer    Age 16   Dyspnea    Erosive esophagitis    Gastritis    Hypertension    Hypothyroidism    Obese    Short-term memory loss    Thyroid  disease    TTP (thrombotic thrombocytopenic purpura) (HCC) 06/2005    Past Surgical History:  Procedure Laterality Date   BIV ICD INSERTION CRT-D N/A 05/03/2019   Procedure: BIV ICD INSERTION CRT-D;  Surgeon: Kelsie Agent, MD;  Location: [facility] INVASIVE CV LAB;  Service: Cardiovascular;  Laterality: N/A;   CARDIAC CATHETERIZATION     COLONOSCOPY  08/29/2012   MFM:Rnonwpr diverticulosis   COLONOSCOPY N/A 05/28/2015   Procedure: COLONOSCOPY;  Surgeon: Lamar CHRISTELLA Hollingshead, MD;  Location: [facility];  Service: Endoscopy;  Laterality: N/A;  1015   ECTOPIC PREGNANCY SURGERY     ESOPHAGOGASTRODUODENOSCOPY  04/20/2012   WLM:Zmndpcz antral gastritis/Erosive reflux  esophagitis   LEFT AND RIGHT HEART CATHETERIZATION WITH CORONARY ANGIOGRAM N/A 03/28/2014   Procedure: LEFT AND RIGHT HEART CATHETERIZATION WITH CORONARY ANGIOGRAM;  Surgeon: Peter M Swaziland, MD;  Location: [facility] CATH LAB;  Service: Cardiovascular;  Laterality: N/A;   SPLENECTOMY  07/20/2005       Scheduled Meds:  Continuous Infusions:  azithromycin  (ZITHROMAX ) 500 mg in sodium chloride  0.9 % 250 mL IVPB     cefTRIAXone  (ROCEPHIN )  IV     PRN Meds:   Allergies:    Allergies  Allergen Reactions   Other Other (See Comments)    FLAGYL , CIPRO , PROTONIX   taken at the same time caused patient to lose her breath and have trouble breathing, requiring hospital stay at [facility], 03/23/14-per patient.    Fentanyl  Nausea Only    Room was spinning prefers not to take it   Lisinopril  Cough    Social History:   Social History   Socioeconomic History   Marital status: Single    Spouse name: Not on file   Number of children: 3   Years of education: 12   Highest education level: Some college, no degree  Occupational History   Occupation: disabled    Associate Professor: NOT EMPLOYED  Tobacco Use   Smoking status: Former    Current packs/day: 0.00    Average packs/day: 0.3 packs/day for 12.0 years (3.0 ttl pk-yrs)    Types: Cigarettes    Start date: 09/20/1966    Quit date: 09/20/1978    Years since quitting: 45.7   Smokeless tobacco: Never   Tobacco comments:    Former smoker 05/15/24  Vaping Use   Vaping status: Never Used  Substance and Sexual Activity   Alcohol use: No    Alcohol/week: 0.0 standard drinks of alcohol   Drug use: No   Sexual activity: Not Currently  Other Topics Concern   Not on file  Social History Narrative   Lives Alone       Social Drivers of Health   Financial Resource Strain: Low Risk  (03/21/2024)   Overall Financial Resource Strain (CARDIA)    Difficulty of Paying Living Expenses: Not hard at all  Food Insecurity: Food Insecurity Present (04/23/2024)   Hunger Vital Sign    Worried About Running Out of Food in the Last Year: Never true    Ran Out of Food in the Last Year: Sometimes true  Transportation Needs: No Transportation Needs (04/23/2024)   PRAPARE - Administrator, Civil Service (Medical): No    Lack of Transportation (Non-Medical): No  Physical Activity: Sufficiently Active (03/21/2024)   Exercise Vital Sign    Days of Exercise per Week: 7 days    Minutes of Exercise per Session: 30 min  Stress: No Stress Concern Present (03/21/2024)   [employer] of Occupational Health - Occupational Stress  Questionnaire    Feeling of Stress: Not at all  Social Connections: Moderately Integrated (03/21/2024)   Social Connection and Isolation Panel    Frequency of Communication with Friends and Family: More than three times a week    Frequency of Social Gatherings with Friends and Family: More than three times a week    Attends Religious Services: More than 4 times per year    Active Member of [employer] or Organizations: Yes    Attends Banker Meetings: More than 4 times per year    Marital Status: Divorced  Intimate Partner Violence: Not At Risk (04/23/2024)   Humiliation, Afraid, Rape, and Kick  questionnaire    Fear of Current or Ex-Partner: No    Emotionally Abused: No    Physically Abused: No    Sexually Abused: No    Family History:    Family History  Problem Relation Age of Onset   Heart failure Mother    Cirrhosis Father        ETOH   Heart disease Brother    Diabetes Daughter    Breast cancer Maternal Aunt    Colon cancer Neg Hx      ROS:  Please see the history of present illness.   All other ROS reviewed and negative.     Physical Exam/Data: Vitals:   06/17/24 2139 06/17/24 2140 06/17/24 2155 06/17/24 2207  BP:   91/74   Pulse: 91 81 85   Resp: 17 (!) 22 13   Temp:    97.6 F (36.4 C)  TempSrc:    Oral  SpO2: 100% 100% 100%    No intake or output data in the 24 hours ending 06/17/24 2258    06/12/2024    3:17 PM 05/15/2024    1:16 PM 04/23/2024   10:12 AM  Last 3 Weights  Weight (lbs) 206 lb 6.4 oz 187 lb 6.4 oz 191 lb  Weight (kg) 93.622 kg 85.004 kg 86.637 kg     There is no height or weight on file to calculate BMI.  General:  Well nourished, well developed, in no acute distress HEENT: normal Neck:  JVD++ Vascular: No carotid bruits; Distal pulses 2+ bilaterally Cardiac:  normal S1, S2; RRR; no murmur  Lungs: Bilateral statin Abd: soft, nontender, no hepatomegaly  Ext: 2+edema Musculoskeletal:  No deformities, BUE and BLE strength normal and  equal Skin: warm and dry  Neuro:  CNs 2-12 intact, no focal abnormalities noted Psych:  Normal affect   Laboratory Data: High Sensitivity Troponin:   Recent Labs  Lab 06/17/24 1813 06/17/24 2005  TROPONINIHS 35* 35*     Chemistry Recent Labs  Lab 06/12/24 1638 06/17/24 1813 06/17/24 1942  NA 138 135 138  K 4.5 4.5 4.7  CL 104 105 105  CO2 18* 17*  --   GLUCOSE 115* 174* 164*  BUN 36* 45* 52*  CREATININE 1.45* 1.59* 1.70*  CALCIUM  9.1 8.7*  --   GFRNONAA  --  34*  --   ANIONGAP  --  13  --     Recent Labs  Lab 06/17/24 1813  PROT 6.8  ALBUMIN 3.0*  AST 14*  ALT 12  ALKPHOS 41  BILITOT 1.1   Lipids No results for input(s): CHOL, TRIG, HDL, LABVLDL, LDLCALC, CHOLHDL in the last 168 hours.  Hematology Recent Labs  Lab 06/12/24 1638 06/17/24 1813 06/17/24 1942  WBC 4.5 4.5  --   RBC 4.17 4.26  --   HGB 12.0 11.9* 13.6  HCT 37.1 35.5* 40.0  MCV 89 83.3  --   MCH 28.8 27.9  --   MCHC 32.3 33.5  --   RDW 20.0* 18.8*  --   PLT 214 110*  --    Thyroid  No results for input(s): TSH, FREET4 in the last 168 hours.  BNP Recent Labs  Lab 06/17/24 1813  BNP 1,998.5*    DDimer No results for input(s): DDIMER in the last 168 hours.  Radiology/Studies:  CT Angio Chest PE W and/or Wo Contrast Result Date: 06/17/2024 CLINICAL DATA:  Chest pain, short of breath EXAM: CT ANGIOGRAPHY CHEST WITH CONTRAST TECHNIQUE: Multidetector CT imaging of  the chest was performed using the standard protocol during bolus administration of intravenous contrast. Multiplanar CT image reconstructions and MIPs were obtained to evaluate the vascular anatomy. RADIATION DOSE REDUCTION: This exam was performed according to the departmental dose-optimization program which includes automated exposure control, adjustment of the mA and/or kV according to patient size and/or use of iterative reconstruction technique. CONTRAST:  75mL OMNIPAQUE  IOHEXOL  350 MG/ML SOLN COMPARISON:   06/17/2024 FINDINGS: Cardiovascular: This is a technically adequate evaluation of the pulmonary vasculature. No filling defects or pulmonary emboli. The heart is enlarged without pericardial effusion. Multi lead cardiac pacer/AICD is identified. Normal caliber of the thoracic aorta. Atherosclerosis of the aorta and coronary vasculature. Mediastinum/Nodes: Heterogeneous enlargement of the right lobe of the thyroid , previously evaluated by ultrasound and fine-needle aspiration. Trachea and esophagus are unremarkable. No pathologic adenopathy. Lungs/Pleura: Right pleural effusion, volume estimated less than 1 L. Trace left pleural effusion. Patchy right basilar consolidation may reflect atelectasis or airspace disease. There is bibasilar bronchial wall thickening, with partial opacification of the lower lobe bronchial tree consistent with retained secretions or aspiration. No pneumothorax. Upper Abdomen: Subtle nodularity of the liver contour or and hypertrophy left lobe liver, suggesting underlying cirrhosis. Small volume ascites within the upper abdomen. Musculoskeletal: No acute or destructive bony abnormalities. Reconstructed images demonstrate no additional findings. Review of the MIP images confirms the above findings. IMPRESSION: 1. No evidence of pulmonary embolus. 2. Cardiomegaly without pericardial effusion. 3. Bilateral pleural effusions, right greater than left. 4. Bibasilar bronchial wall thickening, with segmental opacification of the lower lobe bronchial tree. Findings may reflect retained secretions or aspiration. 5. Patchy right basilar consolidation may reflect atelectasis or airspace disease. 6. Aortic Atherosclerosis (ICD10-I70.0). Coronary artery atherosclerosis. 7. Cirrhotic morphology of the liver, with small volume upper abdominal ascites. Electronically Signed   By: Ozell Daring M.D.   On: 06/17/2024 22:34   DG Chest 2 View Result Date: 06/17/2024 EXAM: 2 VIEW(S) XRAY OF THE CHEST  06/17/2024 06:48:41 PM COMPARISON: None available. CLINICAL HISTORY: sob, chest pain. Per chart - PT c/o onset chest pain, weakness and increased sob this morning. Pt scheduled for cardioversion on Tuesday to correct afib. FINDINGS: LINES, TUBES AND DEVICES: Neuro stimulator over right chest. Left subclavian approach cardiac rhythm maintenance device in place. LUNGS AND PLEURA: Bibasilar and left midlung airspace opacities favoring atelectasis. Pulmonary vascular congestion. Small right pleural effusion. Elevated right hemidiaphragm. Trace left pleural effusion. No pneumothorax. HEART AND MEDIASTINUM: Cardiomegaly. Aortic atherosclerosis. BONES AND SOFT TISSUES: No acute osseous abnormality. IMPRESSION: 1. Cardiomegaly with pulmonary vascular congestion and small right pleural effusion; trace left pleural effusion. Electronically signed by: Norman Gatlin MD 06/17/2024 06:54 PM EDT RP Workstation: [id number]     Assessment and Plan: Acute on chronic HFrEF, EF 40 to 45% Chest pain likely secondary to CHF, flat troponin due to demand ischemia. AKI on CKD secondary to cardiorenal syndrome. Acute hypoxic respiratory failure secondary to pulmonary edema S/p BiV ICD in place. OSA on CPAP. Paroxysmal A-fib awaiting cardioversion in 2 days   Plan. Patient admitted to hospitalist service and cardiology consulted. Continue IV Lasix  40 mg twice daily, volume overload on exam. Resume home medication, await for euvolemia and then consider cardioversion for atrial fibrillation, EKG is very difficult, she has V-paced rhythm, underlying could have A-fib, recent interrogation on 8/18 showed A-fib, burden is low 1%. Resume her home GDMT which includes Eliquis , atorvastatin , , spironolactone . --> Hold Coreg  and losartan  given very low blood pressure   Will follow along  Risk Assessment/Risk Scores:       New York  Heart Association (NYHA) Functional Class NYHA Class III  CHA2DS2-VASc Score = 6   This  indicates a 9.7% annual risk of stroke. The patient's score is based upon: CHF History: 1 HTN History: 1 Diabetes History: 0 Stroke History: 0 Vascular Disease History: 1 Age Score: 2 Gender Score: 1     For questions or updates, please contact [facility] Please consult [email or web address] for contact info under   Signed, Grayce Bold, MD  06/17/2024 10:58 PM

## 2024-06-17 NOTE — ED Triage Notes (Signed)
 PT c/o onset chest pain, weakness and increased sob this morning. Pt scheduled for cardioversion on Tuesday to correct afib.

## 2024-06-17 NOTE — ED Notes (Signed)
Pt remains nauseous 

## 2024-06-17 NOTE — ED Notes (Signed)
 Patient transported to CT

## 2024-06-17 NOTE — ED Provider Notes (Signed)
 [place] EMERGENCY DEPARTMENT AT [facility] Provider Note   CSN: [id number] Arrival date & time: 06/17/24  1805     Patient presents with: Chest Pain   [name] is a 73 y.o. female.  {Add pertinent medical, surgical, social history, OB history to HPI:1438} 73 year old female with a history of CAD, CHF status post defibrillator, atrial fibrillation on Eliquis , and pacemaker who presents to the emergency department with chest discomfort and shortness of breath.  Patient reports that since this morning she has had shortness of breath and chest discomfort.  Describes chest discomfort as pressure that is substernal.  Says he also is feeling generally fatigued as well.  No fevers.  Has had her Lasix  increased recently because she is going for a cardioversion as an outpatient.  Has been compliant with her Eliquis .       Prior to Admission medications   Medication Sig Start Date End Date Taking? Authorizing Provider  acetaminophen  (TYLENOL ) 500 MG tablet Take 1,000 mg by mouth every 6 (six) hours as needed for moderate pain. Patient taking differently: Take 1,000 mg by mouth as needed for moderate pain (pain score 4-6).    [provider]  albuterol  (PROVENTIL ) (2.5 MG/3ML) 0.083% nebulizer solution INHALE THE CONTENTS OF 1 VIAL VIA NEBULIZER EVERY 6 HOURS AS NEEDED FOR SHORTNESS OF BREATH OR WHEEZING 06/04/24   Tobie Suzzane POUR, MD  apixaban  (ELIQUIS ) 5 MG TABS tablet Take 1 tablet (5 mg total) by mouth 2 (two) times daily. 05/15/24   Terra Fairy PARAS, PA-C  atorvastatin  (LIPITOR ) 80 MG tablet TAKE 1 TABLET EVERY DAY 03/02/24   Alvan Dorn FALCON, MD  blood glucose meter kit and supplies Dispense based on patient and insurance preference. Use up to four times daily as directed.(FOR E11.40). 06/22/23   Tobie Suzzane POUR, MD  calcium  carbonate (OSCAL) 1500 (600 Ca) MG TABS tablet Take 1 tablet by mouth daily with breakfast.    [provider]  carvedilol  (COREG )  6.25 MG tablet Take 1 tablet (6.25 mg total) by mouth 2 (two) times daily. 12/22/23   Alvan Dorn FALCON, MD  empagliflozin  (JARDIANCE ) 10 MG TABS tablet Take 1 tablet (10 mg total) by mouth daily before breakfast. 01/03/24   Alvan Dorn FALCON, MD  furosemide  (LASIX ) 20 MG tablet TAKE 1-2 TABLETS (20-40MG  TOTAL) DAILY AS DIRECTED. ALTERNATE 40MG  WITH 20MG  DAILY (DIRECTIONS CHANGE) 05/16/24   Alvan Dorn FALCON, MD  glipiZIDE  (GLUCOTROL ) 5 MG tablet Take 1 tablet (5 mg total) by mouth 2 (two) times daily before a meal. 08/04/23   Tobie, Suzzane POUR, MD  Glucagon  (GVOKE HYPOPEN  2-PACK) 1 MG/0.2ML SOAJ Inject 1 mg into the skin as needed (Blood glucose < 53). 06/22/23   Tobie Suzzane POUR, MD  Insulin  Pen Needle 31G X 5 MM MISC Use as directed for Lantus  and Novolog . 01/31/23   Tobie Suzzane POUR, MD  LANTUS  SOLOSTAR 100 UNIT/ML Solostar Pen INJECT 24 UNITS INTO THE SKIN 2 (TWO) TIMES DAILY. 12/19/23   Tobie Suzzane POUR, MD  levothyroxine  (SYNTHROID ) 50 MCG tablet TAKE 1 TABLET EVERY DAY BEFORE BREAKFAST 12/12/23   Tobie Suzzane POUR, MD  losartan  (COZAAR ) 25 MG tablet TAKE 1 TABLET EVERY DAY 03/02/24   Alvan Dorn FALCON, MD  Multiple Vitamin (MULTIVITAMIN) tablet Take 1 tablet by mouth daily.    [provider]  polyethylene glycol (MIRALAX  / GLYCOLAX ) packet Take 17 g by mouth daily as needed for mild constipation.    [provider]  potassium  chloride (KLOR-CON ) 10 MEQ tablet Take 1 tablet (10 mEq total) by mouth daily. 06/12/24   Terra Fairy PARAS, PA-C  Semaglutide , 1 MG/DOSE, (OZEMPIC , 1 MG/DOSE,) 4 MG/3ML SOPN INJECT 1MG  UNDER THE SKIN ONE TIME WEEKLY AS DIRECTED 05/14/24   Tobie Suzzane POUR, MD  spironolactone  (ALDACTONE ) 25 MG tablet TAKE 1 TABLET EVERY DAY 03/02/24   Alvan Dorn FALCON, MD  TRUE METRIX BLOOD GLUCOSE TEST test strip TEST BLOOD SUGAR UP TO FOUR TIMES DAILY AS DIRECTED 10/26/23   Tobie Suzzane POUR, MD  TRUEplus Lancets 33G MISC USE AS DIRECTED TO TEST BLOOD SUGAR UP TO FOUR TIMES DAILY  01/09/24   Tobie Suzzane POUR, MD    Allergies: Other and Lisinopril     Review of Systems  Updated Vital Signs BP (!) 89/74   Pulse 78   Temp 97.7 F (36.5 C)   Resp 18   SpO2 100%   Physical Exam Vitals and nursing note reviewed.  Constitutional:      General: She is not in acute distress.    Appearance: She is well-developed.  HENT:     Head: Normocephalic and atraumatic.     Right Ear: External ear normal.     Left Ear: External ear normal.     Nose: Nose normal.  Eyes:     Extraocular Movements: Extraocular movements intact.     Conjunctiva/sclera: Conjunctivae normal.     Pupils: Pupils are equal, round, and reactive to light.  Cardiovascular:     Rate and Rhythm: Normal rate and regular rhythm.     Heart sounds: No murmur heard. Pulmonary:     Effort: Pulmonary effort is normal. No respiratory distress.     Breath sounds: Rales (Bibasilar) present.  Musculoskeletal:     Cervical back: Normal range of motion and neck supple.     Right lower leg: Edema present.     Left lower leg: Edema present.  Skin:    General: Skin is warm and dry.  Neurological:     Mental Status: She is alert and oriented to person, place, and time. Mental status is at baseline.  Psychiatric:        Mood and Affect: Mood normal.     (all labs ordered are listed, but only abnormal results are displayed) Labs Reviewed  CBC - Abnormal; Notable for the following components:      Result Value   Hemoglobin 11.9 (*)    HCT 35.5 (*)    RDW 18.8 (*)    Platelets 110 (*)    All other components within normal limits  BRAIN NATRIURETIC PEPTIDE - Abnormal; Notable for the following components:   B Natriuretic Peptide 1,998.5 (*)    All other components within normal limits  COMPREHENSIVE METABOLIC PANEL WITH GFR  TROPONIN I (HIGH SENSITIVITY)    EKG: None  Radiology: DG Chest 2 View Result Date: 06/17/2024 EXAM: 2 VIEW(S) XRAY OF THE CHEST 06/17/2024 06:48:41 PM COMPARISON: None  available. CLINICAL HISTORY: sob, chest pain. Per chart - PT c/o onset chest pain, weakness and increased sob this morning. Pt scheduled for cardioversion on Tuesday to correct afib. FINDINGS: LINES, TUBES AND DEVICES: Neuro stimulator over right chest. Left subclavian approach cardiac rhythm maintenance device in place. LUNGS AND PLEURA: Bibasilar and left midlung airspace opacities favoring atelectasis. Pulmonary vascular congestion. Small right pleural effusion. Elevated right hemidiaphragm. Trace left pleural effusion. No pneumothorax. HEART AND MEDIASTINUM: Cardiomegaly. Aortic atherosclerosis. BONES AND SOFT TISSUES: No acute osseous abnormality. IMPRESSION: 1. Cardiomegaly with  pulmonary vascular congestion and small right pleural effusion; trace left pleural effusion. Electronically signed by: Norman Gatlin MD 06/17/2024 06:54 PM EDT RP Workstation: [id number]    {Document cardiac monitor, telemetry assessment procedure when appropriate:32947} Procedures   Medications Ordered in the ED - No data to display    {Click here for ABCD2, HEART and other calculators REFRESH Note before signing:1}                              Medical Decision Making Amount and/or Complexity of Data Reviewed Labs: ordered. Radiology: ordered.   ***  {Document critical care time when appropriate  Document review of labs and clinical decision tools ie CHADS2VASC2, etc  Document your independent review of radiology images and any outside records  Document your discussion with family members, caretakers and with consultants  Document social determinants of health affecting pt's care  Document your decision making why or why not admission, treatments were needed:32947:::1}   Final diagnoses:  None    ED Discharge Orders     None

## 2024-06-18 ENCOUNTER — Inpatient Hospital Stay (HOSPITAL_COMMUNITY)

## 2024-06-18 ENCOUNTER — Other Ambulatory Visit: Payer: Self-pay

## 2024-06-18 DIAGNOSIS — I5023 Acute on chronic systolic (congestive) heart failure: Secondary | ICD-10-CM | POA: Diagnosis not present

## 2024-06-18 DIAGNOSIS — J90 Pleural effusion, not elsewhere classified: Secondary | ICD-10-CM

## 2024-06-18 DIAGNOSIS — I5043 Acute on chronic combined systolic (congestive) and diastolic (congestive) heart failure: Secondary | ICD-10-CM | POA: Diagnosis not present

## 2024-06-18 DIAGNOSIS — I509 Heart failure, unspecified: Secondary | ICD-10-CM

## 2024-06-18 DIAGNOSIS — J9601 Acute respiratory failure with hypoxia: Secondary | ICD-10-CM | POA: Diagnosis not present

## 2024-06-18 DIAGNOSIS — R570 Cardiogenic shock: Secondary | ICD-10-CM | POA: Diagnosis not present

## 2024-06-18 DIAGNOSIS — I2510 Atherosclerotic heart disease of native coronary artery without angina pectoris: Secondary | ICD-10-CM | POA: Diagnosis not present

## 2024-06-18 DIAGNOSIS — N179 Acute kidney failure, unspecified: Secondary | ICD-10-CM

## 2024-06-18 DIAGNOSIS — R0989 Other specified symptoms and signs involving the circulatory and respiratory systems: Secondary | ICD-10-CM | POA: Diagnosis not present

## 2024-06-18 DIAGNOSIS — I4819 Other persistent atrial fibrillation: Secondary | ICD-10-CM

## 2024-06-18 DIAGNOSIS — I517 Cardiomegaly: Secondary | ICD-10-CM | POA: Diagnosis not present

## 2024-06-18 DIAGNOSIS — I959 Hypotension, unspecified: Secondary | ICD-10-CM | POA: Diagnosis not present

## 2024-06-18 DIAGNOSIS — R918 Other nonspecific abnormal finding of lung field: Secondary | ICD-10-CM | POA: Diagnosis not present

## 2024-06-18 DIAGNOSIS — N1831 Chronic kidney disease, stage 3a: Secondary | ICD-10-CM

## 2024-06-18 DIAGNOSIS — Z452 Encounter for adjustment and management of vascular access device: Secondary | ICD-10-CM | POA: Diagnosis not present

## 2024-06-18 LAB — I-STAT ARTERIAL BLOOD GAS, ED
Acid-base deficit: 7 mmol/L — ABNORMAL HIGH (ref 0.0–2.0)
Bicarbonate: 18.7 mmol/L — ABNORMAL LOW (ref 20.0–28.0)
Calcium, Ion: 1.2 mmol/L (ref 1.15–1.40)
HCT: 38 % (ref 36.0–46.0)
Hemoglobin: 12.9 g/dL (ref 12.0–15.0)
O2 Saturation: 100 %
Patient temperature: 97.6
Potassium: 4.9 mmol/L (ref 3.5–5.1)
Sodium: 136 mmol/L (ref 135–145)
TCO2: 20 mmol/L — ABNORMAL LOW (ref 22–32)
pCO2 arterial: 38 mmHg (ref 32–48)
pH, Arterial: 7.296 — ABNORMAL LOW (ref 7.35–7.45)
pO2, Arterial: 260 mmHg — ABNORMAL HIGH (ref 83–108)

## 2024-06-18 LAB — ECHOCARDIOGRAM COMPLETE
Area-P 1/2: 4.65 cm2
Calc EF: 16.1 %
S' Lateral: 5.4 cm
Single Plane A2C EF: 20.4 %
Single Plane A4C EF: 10 %

## 2024-06-18 LAB — CBC
HCT: 35.3 % — ABNORMAL LOW (ref 36.0–46.0)
Hemoglobin: 12.1 g/dL (ref 12.0–15.0)
MCH: 28.1 pg (ref 26.0–34.0)
MCHC: 34.3 g/dL (ref 30.0–36.0)
MCV: 82.1 fL (ref 80.0–100.0)
Platelets: 90 K/uL — ABNORMAL LOW (ref 150–400)
RBC: 4.3 MIL/uL (ref 3.87–5.11)
RDW: 18.6 % — ABNORMAL HIGH (ref 11.5–15.5)
WBC: 4.5 K/uL (ref 4.0–10.5)
nRBC: 0 % (ref 0.0–0.2)

## 2024-06-18 LAB — BASIC METABOLIC PANEL WITH GFR
Anion gap: 13 (ref 5–15)
BUN: 45 mg/dL — ABNORMAL HIGH (ref 8–23)
CO2: 17 mmol/L — ABNORMAL LOW (ref 22–32)
Calcium: 8.6 mg/dL — ABNORMAL LOW (ref 8.9–10.3)
Chloride: 107 mmol/L (ref 98–111)
Creatinine, Ser: 1.62 mg/dL — ABNORMAL HIGH (ref 0.44–1.00)
GFR, Estimated: 33 mL/min — ABNORMAL LOW (ref >60)
Glucose, Bld: 155 mg/dL — ABNORMAL HIGH (ref 70–99)
Potassium: 4.9 mmol/L (ref 3.5–5.1)
Sodium: 137 mmol/L (ref 135–145)

## 2024-06-18 LAB — MAGNESIUM: Magnesium: 2 mg/dL (ref 1.7–2.4)

## 2024-06-18 LAB — URINALYSIS, ROUTINE W REFLEX MICROSCOPIC
Bilirubin Urine: NEGATIVE
Glucose, UA: NEGATIVE mg/dL
Hgb urine dipstick: NEGATIVE
Ketones, ur: NEGATIVE mg/dL
Leukocytes,Ua: NEGATIVE
Nitrite: NEGATIVE
Protein, ur: NEGATIVE mg/dL
Specific Gravity, Urine: 1.026 (ref 1.005–1.030)
pH: 5 (ref 5.0–8.0)

## 2024-06-18 LAB — COOXEMETRY PANEL
Carboxyhemoglobin: 1.1 % (ref 0.5–1.5)
Methemoglobin: 1 % (ref 0.0–1.5)
O2 Saturation: 55.9 %
Total hemoglobin: 11.1 g/dL — ABNORMAL LOW (ref 12.0–16.0)

## 2024-06-18 LAB — GLUCOSE, CAPILLARY
Glucose-Capillary: 144 mg/dL — ABNORMAL HIGH (ref 70–99)
Glucose-Capillary: 141 mg/dL — ABNORMAL HIGH (ref 70–99)
Glucose-Capillary: 187 mg/dL — ABNORMAL HIGH (ref 70–99)

## 2024-06-18 LAB — MRSA NEXT GEN BY PCR, NASAL: MRSA by PCR Next Gen: NOT DETECTED

## 2024-06-18 LAB — CBG MONITORING, ED
Glucose-Capillary: 170 mg/dL — ABNORMAL HIGH (ref 70–99)
Glucose-Capillary: 148 mg/dL — ABNORMAL HIGH (ref 70–99)

## 2024-06-18 LAB — LACTIC ACID, PLASMA
Lactic Acid, Venous: 0.5 mmol/L (ref 0.5–1.9)
Lactic Acid, Venous: 1.4 mmol/L (ref 0.5–1.9)

## 2024-06-18 MED ORDER — SODIUM CHLORIDE 0.9 % IV BOLUS
1000.0000 mL | Freq: Once | INTRAVENOUS | Status: AC
Start: 2024-06-18 — End: 2024-06-18
  Administered 2024-06-18: 1000 mL via INTRAVENOUS

## 2024-06-18 MED ORDER — ACETAMINOPHEN 650 MG RE SUPP: 650.0000 mg | Freq: Four times a day (QID) | RECTAL | Status: DC | PRN

## 2024-06-18 MED ORDER — FUROSEMIDE 10 MG/ML IJ SOLN
80.0000 mg | Freq: Once | INTRAMUSCULAR | Status: AC
  Administered 2024-06-18: 80 mg via INTRAVENOUS
  Filled 2024-06-18: qty 8

## 2024-06-18 MED ORDER — MILRINONE LACTATE IN DEXTROSE 20-5 MG/100ML-% IV SOLN: 0.2500 ug/kg/min | INTRAVENOUS | Status: DC

## 2024-06-18 MED ORDER — FUROSEMIDE 10 MG/ML IJ SOLN
80.0000 mg | Freq: Two times a day (BID) | INTRAMUSCULAR | Status: DC
  Administered 2024-06-18: 80 mg via INTRAVENOUS
  Filled 2024-06-18: qty 8

## 2024-06-18 MED ORDER — APIXABAN 5 MG PO TABS
5.0000 mg | ORAL_TABLET | Freq: Two times a day (BID) | ORAL | Status: DC
Start: 2024-06-18 — End: 2024-06-19
  Administered 2024-06-18 – 2024-06-19 (×4): 5 mg via ORAL
  Filled 2024-06-18 (×4): qty 1

## 2024-06-18 MED ORDER — ATORVASTATIN CALCIUM 80 MG PO TABS
80.0000 mg | ORAL_TABLET | Freq: Every day | ORAL | Status: DC
  Administered 2024-06-18 – 2024-07-16 (×29): 80 mg via ORAL
  Filled 2024-06-18 (×6): qty 1
  Filled 2024-06-18: qty 2
  Filled 2024-06-18 (×23): qty 1

## 2024-06-18 MED ORDER — SODIUM CHLORIDE 0.9% FLUSH
10.0000 mL | Freq: Two times a day (BID) | INTRAVENOUS | Status: DC
  Administered 2024-06-18 – 2024-07-02 (×18): 10 mL
  Administered 2024-07-02 – 2024-07-03 (×2): 20 mL
  Administered 2024-07-03 – 2024-07-07 (×8): 10 mL
  Administered 2024-07-07: 20 mL
  Administered 2024-07-08 – 2024-07-15 (×14): 10 mL

## 2024-06-18 MED ORDER — PERFLUTREN LIPID MICROSPHERE
1.0000 mL | INTRAVENOUS | Status: AC | PRN
  Administered 2024-06-18: 2 mL via INTRAVENOUS

## 2024-06-18 MED ORDER — SODIUM CHLORIDE 0.9% FLUSH: 10.0000 mL | INTRAVENOUS | Status: DC | PRN

## 2024-06-18 MED ORDER — MIDODRINE HCL 5 MG PO TABS
10.0000 mg | ORAL_TABLET | Freq: Three times a day (TID) | ORAL | Status: DC
Start: 2024-06-18 — End: 2024-06-18
  Administered 2024-06-18: 10 mg via ORAL
  Filled 2024-06-18: qty 2

## 2024-06-18 MED ORDER — ONDANSETRON HCL 4 MG/2ML IJ SOLN
4.0000 mg | Freq: Four times a day (QID) | INTRAMUSCULAR | Status: DC | PRN
  Administered 2024-06-18 – 2024-07-11 (×13): 4 mg via INTRAVENOUS
  Filled 2024-06-18 (×13): qty 2

## 2024-06-18 MED ORDER — MIDODRINE HCL 5 MG PO TABS
5.0000 mg | ORAL_TABLET | Freq: Three times a day (TID) | ORAL | Status: DC
  Administered 2024-06-18: 5 mg via ORAL
  Filled 2024-06-18: qty 1

## 2024-06-18 MED ORDER — INSULIN ASPART 100 UNIT/ML IJ SOLN: 0.0000 [IU] | Freq: Every day | INTRAMUSCULAR | Status: DC

## 2024-06-18 MED ORDER — SODIUM CHLORIDE 0.9% FLUSH
3.0000 mL | Freq: Two times a day (BID) | INTRAVENOUS | Status: DC
  Administered 2024-06-18 – 2024-07-13 (×31): 3 mL via INTRAVENOUS

## 2024-06-18 MED ORDER — ACETAMINOPHEN 325 MG PO TABS
650.0000 mg | ORAL_TABLET | Freq: Four times a day (QID) | ORAL | Status: DC | PRN
  Administered 2024-06-20 – 2024-07-16 (×17): 650 mg via ORAL
  Filled 2024-06-18 (×18): qty 2

## 2024-06-18 MED ORDER — BISACODYL 5 MG PO TBEC
5.0000 mg | DELAYED_RELEASE_TABLET | Freq: Every day | ORAL | Status: DC | PRN
  Administered 2024-07-09 – 2024-07-13 (×3): 5 mg via ORAL
  Filled 2024-06-18 (×3): qty 1

## 2024-06-18 MED ORDER — AMIODARONE HCL IN DEXTROSE 360-4.14 MG/200ML-% IV SOLN
60.0000 mg/h | INTRAVENOUS | Status: AC
  Administered 2024-06-18 (×3): 60 mg/h via INTRAVENOUS
  Filled 2024-06-18 (×3): qty 200

## 2024-06-18 MED ORDER — INSULIN ASPART 100 UNIT/ML IJ SOLN
0.0000 [IU] | Freq: Three times a day (TID) | INTRAMUSCULAR | Status: DC
  Administered 2024-06-18 (×3): 1 [IU] via SUBCUTANEOUS
  Administered 2024-06-19: 2 [IU] via SUBCUTANEOUS

## 2024-06-18 MED ORDER — MILRINONE LACTATE IN DEXTROSE 20-5 MG/100ML-% IV SOLN
0.1250 ug/kg/min | INTRAVENOUS | Status: DC
  Administered 2024-06-18 (×2): 0.25 ug/kg/min via INTRAVENOUS
  Administered 2024-06-19: 0.375 ug/kg/min via INTRAVENOUS
  Administered 2024-06-19: 0.25 ug/kg/min via INTRAVENOUS
  Administered 2024-06-20 – 2024-06-21 (×4): 0.375 ug/kg/min via INTRAVENOUS
  Administered 2024-06-22 (×2): 0.25 ug/kg/min via INTRAVENOUS
  Administered 2024-06-22 – 2024-06-24 (×6): 0.375 ug/kg/min via INTRAVENOUS
  Administered 2024-06-25: 0.25 ug/kg/min via INTRAVENOUS
  Administered 2024-06-25: 0.375 ug/kg/min via INTRAVENOUS
  Administered 2024-06-26: 0.25 ug/kg/min via INTRAVENOUS
  Administered 2024-06-27: 0.125 ug/kg/min via INTRAVENOUS
  Filled 2024-06-18 (×22): qty 100

## 2024-06-18 MED ORDER — SENNOSIDES-DOCUSATE SODIUM 8.6-50 MG PO TABS: 1.0000 | ORAL_TABLET | Freq: Every evening | ORAL | Status: DC | PRN

## 2024-06-18 MED ORDER — POTASSIUM CHLORIDE CRYS ER 10 MEQ PO TBCR
10.0000 meq | EXTENDED_RELEASE_TABLET | Freq: Every day | ORAL | Status: DC
  Administered 2024-06-18: 10 meq via ORAL
  Filled 2024-06-18: qty 1

## 2024-06-18 MED ORDER — SODIUM CHLORIDE 0.9 % IV SOLN
2.0000 g | INTRAVENOUS | Status: AC
  Administered 2024-06-18 – 2024-06-21 (×4): 2 g via INTRAVENOUS
  Filled 2024-06-18 (×4): qty 20

## 2024-06-18 MED ORDER — ALBUTEROL SULFATE (2.5 MG/3ML) 0.083% IN NEBU
2.5000 mg | INHALATION_SOLUTION | Freq: Four times a day (QID) | RESPIRATORY_TRACT | Status: DC | PRN
  Administered 2024-06-18 – 2024-07-14 (×8): 2.5 mg via RESPIRATORY_TRACT
  Filled 2024-06-18 (×8): qty 3

## 2024-06-18 MED ORDER — METOLAZONE 5 MG PO TABS
10.0000 mg | ORAL_TABLET | Freq: Once | ORAL | Status: AC
  Administered 2024-06-18: 10 mg via ORAL
  Filled 2024-06-18: qty 2

## 2024-06-18 MED ORDER — AMIODARONE HCL IN DEXTROSE 360-4.14 MG/200ML-% IV SOLN
60.0000 mg/h | INTRAVENOUS | Status: DC
Start: 2024-06-18 — End: 2024-06-28
  Administered 2024-06-19 (×2): 30 mg/h via INTRAVENOUS
  Administered 2024-06-20 (×3): 60 mg/h via INTRAVENOUS
  Administered 2024-06-20: 30 mg/h via INTRAVENOUS
  Administered 2024-06-21 – 2024-06-28 (×28): 60 mg/h via INTRAVENOUS
  Filled 2024-06-18 (×4): qty 200
  Filled 2024-06-18: qty 400
  Filled 2024-06-18 (×25): qty 200

## 2024-06-18 MED ORDER — ONDANSETRON HCL 4 MG PO TABS: 4.0000 mg | ORAL_TABLET | Freq: Four times a day (QID) | ORAL | Status: DC | PRN

## 2024-06-18 MED ORDER — AMIODARONE LOAD VIA INFUSION
150.0000 mg | Freq: Once | INTRAVENOUS | Status: AC
  Administered 2024-06-18: 150 mg via INTRAVENOUS
  Filled 2024-06-18: qty 83.34

## 2024-06-18 MED ORDER — CHLORHEXIDINE GLUCONATE CLOTH 2 % EX PADS
6.0000 | MEDICATED_PAD | Freq: Every day | CUTANEOUS | Status: DC
  Administered 2024-06-18 – 2024-07-09 (×22): 6 via TOPICAL

## 2024-06-18 MED ORDER — SODIUM CHLORIDE 0.9 % IV SOLN
3.0000 g | Freq: Four times a day (QID) | INTRAVENOUS | Status: DC
  Administered 2024-06-18 (×2): 3 g via INTRAVENOUS
  Filled 2024-06-18 (×2): qty 8

## 2024-06-18 MED ORDER — FUROSEMIDE 10 MG/ML IJ SOLN
20.0000 mg/h | INTRAVENOUS | Status: DC
Start: 2024-06-18 — End: 2024-06-19
  Administered 2024-06-18 – 2024-06-19 (×2): 20 mg/h via INTRAVENOUS
  Filled 2024-06-18 (×2): qty 20

## 2024-06-18 MED ORDER — LEVOTHYROXINE SODIUM 50 MCG PO TABS
50.0000 ug | ORAL_TABLET | Freq: Every day | ORAL | Status: DC
  Administered 2024-06-18 – 2024-07-16 (×29): 50 ug via ORAL
  Filled 2024-06-18 (×23): qty 1
  Filled 2024-06-18: qty 2
  Filled 2024-06-18 (×5): qty 1

## 2024-06-18 NOTE — Consult Note (Addendum)
 NAME:  [name], MRN:  [id number], DOB:  [birth year], LOS: 1 ADMISSION DATE:  06/17/2024, CONSULTATION DATE:  06/18/24 REFERRING MD:  TRH, CHIEF COMPLAINT:  hypotension   History of Present Illness:   73 yoF with hx of CAD, Afib on Eliquis , HFrEF s/p CRT-D, LBBB, DMT2, HLD, HTN, CKD3a, hypothyroidism, and OSA (untreated) who presented to ER 9/28 with progressive weakness, SOB, poor PO intake, LE edema, and mid sternal pressure like CP since Sunday morning.  Occasional cough- clear.  Denies nausea, palpations, diarrhea, has been cold but denies fever.  Reports months of orthopnea per pt.  Was scheduled for cardioversion 9/30, see recently in Afib clinic felt to be hypervolemic therefore lasix  increased x 3 days then back to normal dose.  Denies any vomiting episodes but noted on previous notes she did endorse vomiting episodes. She lives alone, able to dress and bathe herself, uses walker at baseline and has daily CNA with other ADLs/ shopping.   In ER, afebrile but SBP in 90's, initially 97% on RA.  Labs showed WBC 4.5, plts 110, K 4.5, BUN/ sCr 45/ 1.59, BNP 1998, trop 35 x 2.  SARS/ flu/ RSV neg.  EKG showing Vpaced rhythm.  CXR with vascular congestion and small right pleural effusion with cardiomegaly.  CTA neg for PE, showed bilateral pleural effusion, R>L, patchy right basilar consolidation, bibasilar bronchial wall thickening with segmental opacification of LL bronchial tree which may reflect retained secretions or aspiration.  Given lasix  and TRH admitted with cardiology consulting.  BP low overnight, improved after 1L NS and started on midodrine.  Lactic 1.5  BP low again this am with SBP ?60-90's, therefore PCCM consulted for further management.   Pertinent  Medical History  Former smoker -40 yrs Past Medical History:  Diagnosis Date   A-fib (HCC) 06/12/2024   Anemia    Arthritis    CAD (coronary artery disease)    a. LHC (03/2014) Lmain: nl, LAD: diff dz proximal 20%, 30-40% dz mid  vessel prior 2nd diagonal, LCx: 40% in OM1, 20-30% in OM2, RCA: 99% subtotal occlusion @ crux, TIMI 1 flow, L-R collaterals to distal vessel   Cardiomyopathy (HCC) 03/28/2014   LV dysfunction out of proportion to CAD 03/28/14   CHF (congestive heart failure) (HCC)    Phreesia 10/03/2020   Chronic systolic heart failure (HCC)    a. ECHO (03/2014): EF 25-30%, akinesis enteroanteroseptal myocardium, grade III DD b. RHC (03/2014) RA 4, RV 42/4, PA 44/14 (26), PCWP 21, PA 62% Fick CO/CI 6.9 / 3.4   Diabetes mellitus    Diabetes mellitus without complication (HCC)    Phreesia 10/03/2020   Duodenal ulcer    Age 73   Dyspnea    Erosive esophagitis    Gastritis    Hypertension    Hypothyroidism    Obese    Short-term memory loss    Thyroid  disease    TTP (thrombotic thrombocytopenic purpura) (HCC) 06/2005   Significant Hospital Events: Including procedures, antibiotic start and stop dates in addition to other pertinent events   9/28 admitted   Interim History / Subjective:  Denies any current CP or worsening SOB.  Has appetite eating breakfast  Objective    Blood pressure (!) 92/43, pulse 84, temperature 97.8 F (36.6 C), temperature source Axillary, resp. rate (!) 28, SpO2 100%.        Intake/Output Summary (Last 24 hours) at 06/18/2024 0842 Last data filed at 06/18/2024 0443 Gross per 24 hour  Intake --  Output 250 ml  Net -250 ml   There were no vitals filed for this visit.  Examination: General:  chronically ill appearing elderly obese female sitting upright in bed in NAD eating breakfast HEENT: MM pink/moist, +JVD Neuro: Alert, oriented, MAE- generalized weakness CV: vpaced PULM:  some conversational dyspnea, non labored, clear/ diminished, no wheeze, 4L at 100% GI: soft, +bs, NT Extremities: warm/dry, +1 LE edema  Skin: no rashes    Resolved problem list   Assessment and Plan   Hypotension, suspected cardiogenic with AoC HF exacerbation but can not exclude sepsis  with possible aspiration PNA, although remains afebrile with normal WBC normal P:  - monitor in ICU - goal MAP > 65, SBP > 90, currently ok.  Avoid further fluids, peripheral NE if needed.  May need CVL to check coox - repeat lactic now - cont midodrine  - pending echo - cardiology following.  No further episodes of CP today.  Trop flat - hold further lasix  in setting of hypotension - change unasyn to ceftriaxone  given Na content to avoid further volume - trend WBC/ fever curve - follow cultures, check UA  Acute on chronic HF exacerbation, EF 40-45% in 01/2022 Hx HFrEF s/p CRT-D, LBBB CAD Afib on Eliquis  HTN HLD P:  - tele monitoring, currently vpaced - appreciate cardiology input - hold lasix  and home pta antihypertensive (coreg , losartan , spiro) in setting of soft BP and AKI  - pending echo - optimize electrolytes - cont eliquis > may need to change to heparin  gtt pending further procedures  - eventual plan for DCCV when stabilized and better volume status  - statin  Hypoxic respiratory failure in setting of pulmonary edema and RLL basilar consolidation concerning for Aspiration PNA given prior emesis Bilateral pleural effusions, R> L OSA- pt denies CPAP at home P:  - cont supplemental O2 for sat goal > 92% - BiPAP prn  - pulm hygiene - hold further lasix  - check MRSA PCR.  Change unasyn > ctx for now.  - monitor R pleural effusion on bedside US , may need to consider thora vs diureses when able  AKI on CKD3a, concern for cardiorenal NAGMA, mild - previous sCr 1.15 in May, recent 1.45 on 9/23 P:  - strict I/Os's, likely will need foley - stable sCr on repeat labs.  Continue to trend - need UA, check urine lytes.  Consider renal US  if worsening indices  - d/c scheduled daily KCL - trend renal indices  - strict I/Os, daily wts - avoid nephrotoxins, renal dose meds, hemodynamic support as above  Thrombocytopenia  - trend on CBC  Hypothyroidism - cont synthroid     DMT2 - CBG q4, prn SSI   Small volume upper abd ascites - CTA PE noted cirrhosis morphology of the liver.  LFT normal  Impaired mobility  - walker at baseline, PT consult when appropriate   GOC - pt verifies wishes for full GOC.  Son would be NOK.  Labs   CBC: Recent Labs  Lab 06/12/24 1638 06/17/24 1813 06/17/24 1942 06/18/24 0152 06/18/24 0630  WBC 4.5 4.5  --   --  4.5  HGB 12.0 11.9* 13.6 12.9 12.1  HCT 37.1 35.5* 40.0 38.0 35.3*  MCV 89 83.3  --   --  82.1  PLT 214 110*  --   --  90*    Basic Metabolic Panel: Recent Labs  Lab 06/12/24 1638 06/17/24 1813 06/17/24 1942 06/18/24 0152 06/18/24 0630  NA 138 135 138 136 137  K 4.5 4.5 4.7 4.9 4.9  CL 104 105 105  --  107  CO2 18* 17*  --   --  17*  GLUCOSE 115* 174* 164*  --  155*  BUN 36* 45* 52*  --  45*  CREATININE 1.45* 1.59* 1.70*  --  1.62*  CALCIUM  9.1 8.7*  --   --  8.6*  MG  --   --   --   --  2.0   GFR: Estimated Creatinine Clearance: 33.9 mL/min (A) (by C-G formula based on SCr of 1.62 mg/dL (H)). Recent Labs  Lab 06/12/24 1638 06/17/24 1813 06/18/24 0221 06/18/24 0630  WBC 4.5 4.5  --  4.5  LATICACIDVEN  --   --  0.5  --     Liver Function Tests: Recent Labs  Lab 06/17/24 1813  AST 14*  ALT 12  ALKPHOS 41  BILITOT 1.1  PROT 6.8  ALBUMIN 3.0*   No results for input(s): LIPASE, AMYLASE in the last 168 hours. No results for input(s): AMMONIA in the last 168 hours.  ABG    Component Value Date/Time   PHART 7.296 (L) 06/18/2024 0152   PCO2ART 38.0 06/18/2024 0152   PO2ART 260 (H) 06/18/2024 0152   HCO3 18.7 (L) 06/18/2024 0152   TCO2 20 (L) 06/18/2024 0152   ACIDBASEDEF 7.0 (H) 06/18/2024 0152   O2SAT 100 06/18/2024 0152     Coagulation Profile: No results for input(s): INR, PROTIME in the last 168 hours.  Cardiac Enzymes: No results for input(s): CKTOTAL, CKMB, CKMBINDEX, TROPONINI in the last 168 hours.  HbA1C: Hemoglobin A1C  Date/Time Value Ref  Range Status  12/07/2023 12:00 AM 6.8  Final   HbA1c, POC (prediabetic range)  Date/Time Value Ref Range Status  07/09/2020 02:02 PM 7.8 (A) 5.7 - 6.4 % Final   HbA1c, POC (controlled diabetic range)  Date/Time Value Ref Range Status  01/31/2023 03:10 PM 7.1 (A) 0.0 - 7.0 % Final  07/09/2020 02:02 PM 7.8 (A) 0.0 - 7.0 % Final   HbA1c POC (<> result, manual entry)  Date/Time Value Ref Range Status  07/09/2020 02:02 PM 7.8 4.0 - 5.6 % Final   Hgb A1c MFr Bld  Date/Time Value Ref Range Status  02/17/2024 12:01 PM 6.6 (H) 4.8 - 5.6 % Final    Comment:             Prediabetes: 5.7 - 6.4          Diabetes: >6.4          Glycemic control for adults with diabetes: <7.0   06/13/2023 01:56 PM >15.5 (H) 4.8 - 5.6 % Final    Comment:             Prediabetes: 5.7 - 6.4          Diabetes: >6.4          Glycemic control for adults with diabetes: <7.0     CBG: Recent Labs  Lab 06/18/24 0114  GLUCAP 170*    Review of Systems:   Review of Systems  Constitutional:  Positive for malaise/fatigue. Negative for chills and fever.  Respiratory:  Positive for cough and shortness of breath. Negative for sputum production and wheezing.   Cardiovascular:  Positive for chest pain, orthopnea and leg swelling. Negative for palpitations.  Gastrointestinal:  Negative for abdominal pain, diarrhea, nausea and vomiting.  Genitourinary:  Negative for frequency and urgency.  Neurological:  Positive for weakness. Negative for focal weakness.   Past Medical  History:  She,  has a past medical history of A-fib (HCC) (06/12/2024), Anemia, Arthritis, CAD (coronary artery disease), Cardiomyopathy (HCC) (03/28/2014), CHF (congestive heart failure) (HCC), Chronic systolic heart failure (HCC), Diabetes mellitus, Diabetes mellitus without complication (HCC), Duodenal ulcer, Dyspnea, Erosive esophagitis, Gastritis, Hypertension, Hypothyroidism, Obese, Short-term memory loss, Thyroid  disease, and TTP (thrombotic  thrombocytopenic purpura) (HCC) (06/2005).   Surgical History:   Past Surgical History:  Procedure Laterality Date   BIV ICD INSERTION CRT-D N/A 05/03/2019   Procedure: BIV ICD INSERTION CRT-D;  Surgeon: Kelsie Agent, MD;  Location: [facility] INVASIVE CV LAB;  Service: Cardiovascular;  Laterality: N/A;   CARDIAC CATHETERIZATION     COLONOSCOPY  08/29/2012   MFM:Rnonwpr diverticulosis   COLONOSCOPY N/A 05/28/2015   Procedure: COLONOSCOPY;  Surgeon: Lamar CHRISTELLA Hollingshead, MD;  Location: [facility] ENDO SUITE;  Service: Endoscopy;  Laterality: N/A;  1015   ECTOPIC PREGNANCY SURGERY     ESOPHAGOGASTRODUODENOSCOPY  04/20/2012   WLM:Zmndpcz antral gastritis/Erosive reflux esophagitis   LEFT AND RIGHT HEART CATHETERIZATION WITH CORONARY ANGIOGRAM N/A 03/28/2014   Procedure: LEFT AND RIGHT HEART CATHETERIZATION WITH CORONARY ANGIOGRAM;  Surgeon: Peter M Swaziland, MD;  Location: [facility] CATH LAB;  Service: Cardiovascular;  Laterality: N/A;   SPLENECTOMY  07/20/2005     Social History:   reports that she quit smoking about 45 years ago. Her smoking use included cigarettes. She started smoking about 57 years ago. She has a 3 pack-year smoking history. She has never used smokeless tobacco. She reports that she does not drink alcohol and does not use drugs.   Family History:  Her family history includes Breast cancer in her maternal aunt; Cirrhosis in her father; Diabetes in her daughter; Heart disease in her brother; Heart failure in her mother. There is no history of Colon cancer.   Allergies Allergies  Allergen Reactions   Other Other (See Comments)    FLAGYL , CIPRO , PROTONIX  taken at the same time caused patient to lose her breath and have trouble breathing, requiring hospital stay at [facility], 03/23/14-per patient.    Fentanyl  Nausea Only    Room was spinning prefers not to take it   Lisinopril  Cough     Home Medications  Prior to Admission medications   Medication Sig Start Date End Date Taking? Authorizing Provider   acetaminophen  (TYLENOL ) 500 MG tablet Take 1,000 mg by mouth every 6 (six) hours as needed for moderate pain. Patient taking differently: Take 1,000 mg by mouth as needed for moderate pain (pain score 4-6).   Yes [provider]  albuterol  (PROVENTIL ) (2.5 MG/3ML) 0.083% nebulizer solution INHALE THE CONTENTS OF 1 VIAL VIA NEBULIZER EVERY 6 HOURS AS NEEDED FOR SHORTNESS OF BREATH OR WHEEZING 06/04/24  Yes Tobie Suzzane POUR, MD  apixaban  (ELIQUIS ) 5 MG TABS tablet Take 1 tablet (5 mg total) by mouth 2 (two) times daily. 05/15/24  Yes Terra Fairy PARAS, PA-C  atorvastatin  (LIPITOR ) 80 MG tablet TAKE 1 TABLET EVERY DAY 03/02/24  Yes Branch, Dorn FALCON, MD  calcium  carbonate (OSCAL) 1500 (600 Ca) MG TABS tablet Take 1 tablet by mouth daily with breakfast.   Yes [provider]  carvedilol  (COREG ) 6.25 MG tablet Take 1 tablet (6.25 mg total) by mouth 2 (two) times daily. 12/22/23  Yes Branch, Dorn FALCON, MD  furosemide  (LASIX ) 20 MG tablet TAKE 1-2 TABLETS (20-40MG  TOTAL) DAILY AS DIRECTED. ALTERNATE 40MG  WITH 20MG  DAILY (DIRECTIONS CHANGE) 05/16/24  Yes Branch, Dorn FALCON, MD  Glucagon  (GVOKE HYPOPEN  2-PACK) 1 MG/0.2ML SOAJ Inject 1  mg into the skin as needed (Blood glucose < 53). 06/22/23  Yes Tobie Suzzane POUR, MD  LANTUS  SOLOSTAR 100 UNIT/ML Solostar Pen INJECT 24 UNITS INTO THE SKIN 2 (TWO) TIMES DAILY. Patient taking differently: Inject 20 Units into the skin 2 (two) times daily. 12/19/23  Yes Tobie Suzzane POUR, MD  levothyroxine  (SYNTHROID ) 50 MCG tablet TAKE 1 TABLET EVERY DAY BEFORE BREAKFAST 12/12/23  Yes Patel, Rutwik K, MD  Multiple Vitamin (MULTIVITAMIN) tablet Take 1 tablet by mouth daily.   Yes [provider]  polyethylene glycol (MIRALAX  / GLYCOLAX ) packet Take 17 g by mouth daily as needed for mild constipation.   Yes [provider]  potassium chloride  (KLOR-CON ) 10 MEQ tablet Take 1 tablet (10 mEq total) by mouth daily. 06/12/24  Yes Terra Fairy PARAS, PA-C   spironolactone  (ALDACTONE ) 25 MG tablet TAKE 1 TABLET EVERY DAY 03/02/24  Yes Branch, Dorn FALCON, MD  blood glucose meter kit and supplies Dispense based on patient and insurance preference. Use up to four times daily as directed.(FOR E11.40). 06/22/23   Tobie Suzzane POUR, MD  empagliflozin  (JARDIANCE ) 10 MG TABS tablet Take 1 tablet (10 mg total) by mouth daily before breakfast. Patient not taking: Reported on 06/17/2024 01/03/24   Alvan Dorn FALCON, MD  glipiZIDE  (GLUCOTROL ) 5 MG tablet Take 1 tablet (5 mg total) by mouth 2 (two) times daily before a meal. Patient not taking: Reported on 06/17/2024 08/04/23   Tobie Suzzane POUR, MD  Insulin  Pen Needle 31G X 5 MM MISC Use as directed for Lantus  and Novolog . 01/31/23   Tobie Suzzane POUR, MD  losartan  (COZAAR ) 25 MG tablet TAKE 1 TABLET EVERY DAY Patient not taking: Reported on 06/17/2024 03/02/24   Alvan Dorn FALCON, MD  Semaglutide , 1 MG/DOSE, (OZEMPIC , 1 MG/DOSE,) 4 MG/3ML SOPN INJECT 1MG  UNDER THE SKIN ONE TIME WEEKLY AS DIRECTED Patient not taking: Reported on 06/17/2024 05/14/24   Tobie Suzzane POUR, MD  TRUE METRIX BLOOD GLUCOSE TEST test strip TEST BLOOD SUGAR UP TO FOUR TIMES DAILY AS DIRECTED 10/26/23   Tobie Suzzane POUR, MD  TRUEplus Lancets 33G MISC USE AS DIRECTED TO TEST BLOOD SUGAR UP TO FOUR TIMES DAILY 01/09/24   Tobie Suzzane POUR, MD     Critical care time:     CRITICAL CARE Performed by: Lyle Pesa   Total critical care time: 60  minutes  Critical care time was exclusive of separately billable procedures and treating other patients.  Critical care was necessary to treat or prevent imminent or life-threatening deterioration.  Critical care was time spent personally by me on the following activities: development of treatment plan with patient and/or surrogate as well as nursing, discussions with consultants, evaluation of patient's response to treatment, examination of patient, obtaining history from patient or surrogate, ordering and  performing treatments and interventions, ordering and review of laboratory studies, ordering and review of radiographic studies, pulse oximetry and re-evaluation of patient's condition.    Lyle Pesa, NP [facility] 06/18/2024, 8:42 AM  See Amion for pager If no response to pager , please call [phone] until 7pm After 7:00 pm call Elink  [phone]

## 2024-06-18 NOTE — ED Notes (Addendum)
 MD made aware of decrease in BP, see new orders. Pt declines dizziness, a/ox4, work of breathing increase, pt states that she feels better than earlier.

## 2024-06-18 NOTE — Progress Notes (Signed)
 Rounding Note   Patient Name: [name] Date of Encounter: 06/18/2024  [facility] Cardiologist: None Dr. Dorn Ross  Subjective Feeling short of breath for months.  Acutely worse in the last couple days.   Scheduled Meds:  apixaban   5 mg Oral BID   atorvastatin   80 mg Oral Daily   insulin  aspart  0-5 Units Subcutaneous QHS   insulin  aspart  0-9 Units Subcutaneous TID WC   levothyroxine   50 mcg Oral Q0600   midodrine  10 mg Oral TID WC   potassium chloride   10 mEq Oral Daily   sodium chloride  flush  3 mL Intravenous Q12H   Continuous Infusions:  ampicillin-sulbactam (UNASYN) IV Stopped (06/18/24 0144)   PRN Meds: acetaminophen  **OR** acetaminophen , albuterol , bisacodyl, ondansetron  **OR** ondansetron  (ZOFRAN ) IV, perflutren lipid microspheres (DEFINITY) IV suspension, senna-docusate   Vital Signs  Vitals:   06/18/24 0339 06/18/24 0346 06/18/24 0435 06/18/24 0453  BP: (!) 87/76 104/81 101/71 (!) 92/43  Pulse: 90 95 77 84  Resp: (!) 25 (!) 26 (!) 31 (!) 28  Temp:    97.8 F (36.6 C)  TempSrc:    Axillary  SpO2: 100% 100% 100% 100%    Intake/Output Summary (Last 24 hours) at 06/18/2024 0911 Last data filed at 06/18/2024 0443 Gross per 24 hour  Intake --  Output 250 ml  Net -250 ml      06/12/2024    3:17 PM 05/15/2024    1:16 PM 04/23/2024   10:12 AM  Last 3 Weights  Weight (lbs) 206 lb 6.4 oz 187 lb 6.4 oz 191 lb  Weight (kg) 93.622 kg 85.004 kg 86.637 kg      Telemetry Afib.  VP - Personally Reviewed  ECG  N/a - Personally Reviewed  Physical Exam  VS:  BP 93/71   Pulse 95   Temp (!) 97.5 F (36.4 C) (Axillary)   Resp (!) 22   SpO2 100%  , BMI There is no height or weight on file to calculate BMI. GENERAL: Ill-appearing HEENT: Pupils equal round and reactive, fundi not visualized, oral mucosa unremarkable NECK: + jugular venous distention to mandible at 45 degrees, waveform within normal limits, carotid upstroke brisk and  symmetric, no bruits, no thyromegaly LUNGS:  Diminished at bases. HEART:  Irregularly irregular.  PMI not displaced or sustained,S1 and S2 within normal limits, no S3, no S4, no clicks, no rubs, no murmurs ABD:  Flat, positive bowel sounds normal in frequency in pitch, no bruits, no rebound, no guarding, no midline pulsatile mass, no hepatomegaly, no splenomegaly EXT:  2 plus pulses throughout, 2+ LE edema, no cyanosis no clubbing SKIN:  No rashes no nodules NEURO:  Cranial nerves II through XII grossly intact, motor grossly intact throughout PSYCH:  Cognitively intact, oriented to person place and time  Labs High Sensitivity Troponin:   Recent Labs  Lab 06/17/24 1813 06/17/24 2005  TROPONINIHS 35* 35*     Chemistry Recent Labs  Lab 06/12/24 1638 06/12/24 1638 06/17/24 1813 06/17/24 1942 06/18/24 0152 06/18/24 0630  NA 138   < > 135 138 136 137  K 4.5  --  4.5 4.7 4.9 4.9  CL 104  --  105 105  --  107  CO2 18*  --  17*  --   --  17*  GLUCOSE 115*  --  174* 164*  --  155*  BUN 36*  --  45* 52*  --  45*  CREATININE 1.45*  --  1.59* 1.70*  --  1.62*  CALCIUM  9.1  --  8.7*  --   --  8.6*  MG  --   --   --   --   --  2.0  PROT  --   --  6.8  --   --   --   ALBUMIN  --   --  3.0*  --   --   --   AST  --   --  14*  --   --   --   ALT  --   --  12  --   --   --   ALKPHOS  --   --  41  --   --   --   BILITOT  --   --  1.1  --   --   --   GFRNONAA  --   --  34*  --   --  33*  ANIONGAP  --   --  13  --   --  13   < > = values in this interval not displayed.    Lipids No results for input(s): CHOL, TRIG, HDL, LABVLDL, LDLCALC, CHOLHDL in the last 168 hours.  Hematology Recent Labs  Lab 06/12/24 1638 06/17/24 1813 06/17/24 1813 06/17/24 1942 06/18/24 0152 06/18/24 0630  WBC 4.5 4.5  --   --   --  4.5  RBC 4.17 4.26  --   --   --  4.30  HGB 12.0 11.9*   < > 13.6 12.9 12.1  HCT 37.1 35.5*   < > 40.0 38.0 35.3*  MCV 89 83.3  --   --   --  82.1  MCH 28.8 27.9   --   --   --  28.1  MCHC 32.3 33.5  --   --   --  34.3  RDW 20.0* 18.8*  --   --   --  18.6*  PLT 214 110*  --   --   --  90*   < > = values in this interval not displayed.   Thyroid  No results for input(s): TSH, FREET4 in the last 168 hours.  BNP Recent Labs  Lab 06/17/24 1813  BNP 1,998.5*    DDimer No results for input(s): DDIMER in the last 168 hours.   Radiology  CT Angio Chest PE W and/or Wo Contrast Result Date: 06/17/2024 CLINICAL DATA:  Chest pain, short of breath EXAM: CT ANGIOGRAPHY CHEST WITH CONTRAST TECHNIQUE: Multidetector CT imaging of the chest was performed using the standard protocol during bolus administration of intravenous contrast. Multiplanar CT image reconstructions and MIPs were obtained to evaluate the vascular anatomy. RADIATION DOSE REDUCTION: This exam was performed according to the departmental dose-optimization program which includes automated exposure control, adjustment of the mA and/or kV according to patient size and/or use of iterative reconstruction technique. CONTRAST:  75mL OMNIPAQUE  IOHEXOL  350 MG/ML SOLN COMPARISON:  06/17/2024 FINDINGS: Cardiovascular: This is a technically adequate evaluation of the pulmonary vasculature. No filling defects or pulmonary emboli. The heart is enlarged without pericardial effusion. Multi lead cardiac pacer/AICD is identified. Normal caliber of the thoracic aorta. Atherosclerosis of the aorta and coronary vasculature. Mediastinum/Nodes: Heterogeneous enlargement of the right lobe of the thyroid , previously evaluated by ultrasound and fine-needle aspiration. Trachea and esophagus are unremarkable. No pathologic adenopathy. Lungs/Pleura: Right pleural effusion, volume estimated less than 1 L. Trace left pleural effusion. Patchy right basilar consolidation may reflect atelectasis or airspace disease. There is bibasilar bronchial  wall thickening, with partial opacification of the lower lobe bronchial tree consistent with  retained secretions or aspiration. No pneumothorax. Upper Abdomen: Subtle nodularity of the liver contour or and hypertrophy left lobe liver, suggesting underlying cirrhosis. Small volume ascites within the upper abdomen. Musculoskeletal: No acute or destructive bony abnormalities. Reconstructed images demonstrate no additional findings. Review of the MIP images confirms the above findings. IMPRESSION: 1. No evidence of pulmonary embolus. 2. Cardiomegaly without pericardial effusion. 3. Bilateral pleural effusions, right greater than left. 4. Bibasilar bronchial wall thickening, with segmental opacification of the lower lobe bronchial tree. Findings may reflect retained secretions or aspiration. 5. Patchy right basilar consolidation may reflect atelectasis or airspace disease. 6. Aortic Atherosclerosis (ICD10-I70.0). Coronary artery atherosclerosis. 7. Cirrhotic morphology of the liver, with small volume upper abdominal ascites. Electronically Signed   By: Ozell Daring M.D.   On: 06/17/2024 22:34   DG Chest 2 View Result Date: 06/17/2024 EXAM: 2 VIEW(S) XRAY OF THE CHEST 06/17/2024 06:48:41 PM COMPARISON: None available. CLINICAL HISTORY: sob, chest pain. Per chart - PT c/o onset chest pain, weakness and increased sob this morning. Pt scheduled for cardioversion on Tuesday to correct afib. FINDINGS: LINES, TUBES AND DEVICES: Neuro stimulator over right chest. Left subclavian approach cardiac rhythm maintenance device in place. LUNGS AND PLEURA: Bibasilar and left midlung airspace opacities favoring atelectasis. Pulmonary vascular congestion. Small right pleural effusion. Elevated right hemidiaphragm. Trace left pleural effusion. No pneumothorax. HEART AND MEDIASTINUM: Cardiomegaly. Aortic atherosclerosis. BONES AND SOFT TISSUES: No acute osseous abnormality. IMPRESSION: 1. Cardiomegaly with pulmonary vascular congestion and small right pleural effusion; trace left pleural effusion. Electronically signed by:  Norman Gatlin MD 06/17/2024 06:54 PM EDT RP Workstation: [id number]    Cardiac Studies  Echo 02/01/22: 1. Left ventricular ejection fraction, by estimation, is 40 to 45%. The  left ventricle has mildly decreased function. The left ventricle  demonstrates global hypokinesis. There is mild left ventricular  hypertrophy. Left ventricular diastolic parameters  are consistent with Grade I diastolic dysfunction (impaired relaxation).  Elevated left atrial pressure.   2. Right ventricular systolic function is normal. The right ventricular  size is normal. Tricuspid regurgitation signal is inadequate for assessing  PA pressure.   3. The mitral valve is normal in structure. No evidence of mitral valve  regurgitation. No evidence of mitral stenosis.   4. The aortic valve has an indeterminant number of cusps. Aortic valve  regurgitation is not visualized. No aortic stenosis is present.   Patient Profile   73 y.o. female with HFrEF, s/p CRT-D, CAD, OSA on BiPAP, HTN, LH, and PAF admitted with acute on chronic HF and aspiration PNA.   Assessment & Plan   # Acute on chronic HFrEF: # Cardiogenic shock:  LVEF 40-45% in 2023.  Repeat echo is pending. BNP 2000 on admission.  Lasix  held overnight 2/2 hypotension. Holding home GDMT 2/2 hypotension. She is clearly volume overloaded.  Will need milrinone to diurese.  Will order PICC line.  Start diuresis once BP allows.  Check Co-ox after PICC is placed and then at least daily.  Resume GDMT as BP allows. Transfer to 2H.  # Persistent AF:  Continue Eliquis .  Carvedilol  on hold. Diurese as above then DCCV prior to discharge.  They were planning to pursue this as an outpatient but she needs diuresis first.   # HTN:  Currently hypotensive.  Home carvedilol , losartan , and spironolactone  on hold.    # AKI:  Baseline creatinine 1.1.  Up to 1.7 this admission.  Hopefully will improve with diuresis.   # CAD: # Elevated troponin:  # Hyperlipidemia:   Continue atorvasatin. She reports chest discomfort.  Unclear if this is ischemic or volume overload.    # OSA:  She hasn't been on BiPAP at home as recommended.   # Cirrhosis: Noted on CT chest.   # PAD:  Continue medical management.     For questions or updates, please contact [facility] Please consult [email or web address] for contact info under      CRITICAL CARE TIME Performed by: Annabella Scarce, MD   Total critical care time: 50 minutes  Critical care time was exclusive of separately billable procedures and treating other patients.  Critical care was necessary to treat or prevent imminent or life-threatening deterioration.  Critical care was time spent personally by me on the following activities: development of treatment plan with patient and/or surrogate as well as nursing, discussions with consultants, evaluation of patient's response to treatment, examination of patient, obtaining history from patient or surrogate, ordering and performing treatments and interventions, ordering and review of laboratory studies, ordering and review of radiographic studies, pulse oximetry and re-evaluation of patient's condition. Signed, Annabella Scarce, MD  06/18/2024, 9:11 AM

## 2024-06-18 NOTE — ED Notes (Signed)
 CCMD aware of pt transfer to room 8

## 2024-06-18 NOTE — ED Notes (Signed)
Report received, assumed care of patient at this time.  

## 2024-06-18 NOTE — Progress Notes (Signed)
 Orthopedic Tech Progress Note Patient Details:  [name] [birth year] [id number]  Ortho Devices Type of Ortho Device: Ace wrap, Unna boot Ortho Device/Splint Location: BLE Ortho Device/Splint Interventions: Ordered, Application, Adjustment   Post Interventions Patient Tolerated: Well Instructions Provided: Care of device  [name] Pac 06/18/2024, 3:53 PM

## 2024-06-18 NOTE — Consult Note (Addendum)
 Advanced Heart Failure Team Consult Note   Primary Physician: Tobie Suzzane POUR, MD Cardiologist:  Dr. Alvan  Reason for Consultation: Acute on Chronic Systolic Heart Failure HPI:    [name] is seen today for evaluation of acute on chronic heart failure at the request of Dr. Raford.   [name] is a 73 y.o.  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 Patient of Dr. Alvan. Of note, mother and aunt had HFrEF  Diagnosed with heart failure in 03/2014. Echo showed EF 25-30%, coronary angio showed 99% RCA. Felt that LV function was out of proportion to her CAD. Repeat echo 10/15 showed improvement to 40-45%. She   Her EF has remained in the 30-35% range since 2019. She underwent BiV device insertion in 04/2019, she is followed by [facility]. GDMT has been limited by hypotension.   Noted to be in new AF on device interrogation 05/09/24, she was set up for and seen in AFIB clinic 05/15/24. She was started on Eliquis  and scheduled for cardioversion, however started to develop worsening HF symptoms (swelling, shortness of breath, edema, fatigue).   She presented to [facility] 06/17/24 with the above symptoms. Vitals in the ED were notable for hypotension 85/69, HR 70s and hypoxia requiring 2L [place]. Initial EKG read as V pacing, however very poor quality. Labs were notable for hgb 11.9, K 4.5, bicarb 17, Cr 1.59, BNP 1998, and trops 35 >35. CTA without PE, bilateral pleural effusions, and small volume ascities. CXR with bilateral pleural effusions and congestion. Cardiology was consulted. She was given IV Lasix  and started on her home GDMT, however overnight Lasix  was held due to hypotension. She was admitted to the ICU for further management and monitoring.   Today, she is very fatigued and drowsy. Was of her normal state of health, able to perform ADLs up until a couple weeks ago. No chest pain.    Home Medications Prior to Admission medications   Medication Sig Start Date End Date Taking? Authorizing Provider  acetaminophen  (TYLENOL ) 500 MG tablet Take 1,000 mg by mouth every 6 (six) hours as needed for moderate pain. Patient taking differently: Take 1,000 mg by mouth as needed for moderate pain (pain score 4-6).   Yes [provider]  albuterol  (PROVENTIL ) (2.5 MG/3ML) 0.083% nebulizer solution INHALE THE CONTENTS OF 1 VIAL VIA NEBULIZER EVERY 6 HOURS AS NEEDED FOR SHORTNESS OF BREATH OR WHEEZING 06/04/24  Yes Tobie Suzzane POUR, MD  apixaban  (ELIQUIS ) 5 MG TABS tablet Take 1 tablet (5 mg total) by mouth 2 (two) times daily. 05/15/24  Yes Terra Fairy PARAS, PA-C  atorvastatin  (LIPITOR ) 80 MG tablet TAKE 1 TABLET EVERY DAY 03/02/24  Yes Branch, Dorn FALCON, MD  calcium  carbonate (OSCAL) 1500 (600 Ca) MG TABS tablet Take 1 tablet by mouth daily with breakfast.   Yes [provider]  carvedilol  (COREG ) 6.25 MG tablet Take 1 tablet (6.25 mg total) by mouth 2 (two) times daily. 12/22/23  Yes BranchDorn FALCON, MD  furosemide  (LASIX ) 20 MG tablet TAKE 1-2 TABLETS (20-40MG  TOTAL) DAILY AS DIRECTED. ALTERNATE 40MG  WITH 20MG  DAILY (DIRECTIONS CHANGE) 05/16/24  Yes Branch, Dorn FALCON, MD  Glucagon  (GVOKE HYPOPEN  2-PACK) 1 MG/0.2ML SOAJ Inject 1 mg into the skin as needed (Blood glucose < 53). 06/22/23  Yes Tobie Suzzane POUR, MD  LANTUS  SOLOSTAR 100 UNIT/ML Solostar Pen INJECT 24 UNITS INTO THE SKIN  2 (TWO) TIMES DAILY. Patient taking differently: Inject 20 Units into the skin 2 (two) times daily. 12/19/23  Yes Tobie Suzzane POUR, MD  levothyroxine  (SYNTHROID ) 50 MCG tablet TAKE 1 TABLET EVERY DAY BEFORE BREAKFAST 12/12/23  Yes Patel, Rutwik K, MD  Multiple Vitamin (MULTIVITAMIN) tablet Take 1 tablet by mouth daily.   Yes [provider]  polyethylene glycol (MIRALAX  / GLYCOLAX ) packet Take 17 g by mouth daily as needed for mild constipation.   Yes [provider]  potassium  chloride (KLOR-CON ) 10 MEQ tablet Take 1 tablet (10 mEq total) by mouth daily. 06/12/24  Yes Terra Fairy PARAS, PA-C  spironolactone  (ALDACTONE ) 25 MG tablet TAKE 1 TABLET EVERY DAY 03/02/24  Yes Branch, Dorn FALCON, MD  blood glucose meter kit and supplies Dispense based on patient and insurance preference. Use up to four times daily as directed.(FOR E11.40). 06/22/23   Tobie Suzzane POUR, MD  empagliflozin  (JARDIANCE ) 10 MG TABS tablet Take 1 tablet (10 mg total) by mouth daily before breakfast. Patient not taking: Reported on 06/17/2024 01/03/24   Alvan Dorn FALCON, MD  glipiZIDE  (GLUCOTROL ) 5 MG tablet Take 1 tablet (5 mg total) by mouth 2 (two) times daily before a meal. Patient not taking: Reported on 06/17/2024 08/04/23   Tobie Suzzane POUR, MD  Insulin  Pen Needle 31G X 5 MM MISC Use as directed for Lantus  and Novolog . 01/31/23   Tobie Suzzane POUR, MD  losartan  (COZAAR ) 25 MG tablet TAKE 1 TABLET EVERY DAY Patient not taking: Reported on 06/17/2024 03/02/24   Alvan Dorn FALCON, MD  Semaglutide , 1 MG/DOSE, (OZEMPIC , 1 MG/DOSE,) 4 MG/3ML SOPN INJECT 1MG  UNDER THE SKIN ONE TIME WEEKLY AS DIRECTED Patient not taking: Reported on 06/17/2024 05/14/24   Tobie Suzzane POUR, MD  TRUE METRIX BLOOD GLUCOSE TEST test strip TEST BLOOD SUGAR UP TO FOUR TIMES DAILY AS DIRECTED 10/26/23   Tobie Suzzane POUR, MD  TRUEplus Lancets 33G MISC USE AS DIRECTED TO TEST BLOOD SUGAR UP TO FOUR TIMES DAILY 01/09/24   Tobie Suzzane POUR, MD    Past Medical History: Past Medical History:  Diagnosis Date   A-fib (HCC) 06/12/2024   Anemia    Arthritis    CAD (coronary artery disease)    a. LHC (03/2014) [place]: nl, LAD: diff dz proximal 20%, 30-40% dz mid vessel prior 2nd diagonal, LCx: 40% in OM1, 20-30% in OM2, RCA: 99% subtotal occlusion @ crux, TIMI 1 flow, L-R collaterals to distal vessel   Cardiomyopathy (HCC) 03/28/2014   LV dysfunction out of proportion to CAD 03/28/14   CHF (congestive heart failure) (HCC)    Phreesia 10/03/2020    Chronic systolic heart failure (HCC)    a. ECHO (03/2014): EF 25-30%, akinesis enteroanteroseptal myocardium, grade III DD b. RHC (03/2014) RA 4, RV 42/4, PA 44/14 (26), PCWP 21, PA 62% Fick CO/CI 6.9 / 3.4   Diabetes mellitus    Diabetes mellitus without complication (HCC)    Phreesia 10/03/2020   Duodenal ulcer    Age 21   Dyspnea    Erosive esophagitis    Gastritis    Hypertension    Hypothyroidism    Obese    Short-term memory loss    Thyroid  disease    TTP (thrombotic thrombocytopenic purpura) (HCC) 06/2005    Past Surgical History: Past Surgical History:  Procedure Laterality Date   BIV ICD INSERTION CRT-D N/A 05/03/2019   Procedure: BIV ICD INSERTION CRT-D;  Surgeon: Kelsie Agent, MD;  Location: [facility] INVASIVE CV LAB;  Service: Cardiovascular;  Laterality: N/A;   CARDIAC CATHETERIZATION     COLONOSCOPY  08/29/2012   MFM:Rnonwpr diverticulosis   COLONOSCOPY N/A 05/28/2015   Procedure: COLONOSCOPY;  Surgeon: Lamar CHRISTELLA Hollingshead, MD;  Location: [facility] ENDO SUITE;  Service: Endoscopy;  Laterality: N/A;  1015   ECTOPIC PREGNANCY SURGERY     ESOPHAGOGASTRODUODENOSCOPY  04/20/2012   WLM:Zmndpcz antral gastritis/Erosive reflux esophagitis   LEFT AND RIGHT HEART CATHETERIZATION WITH CORONARY ANGIOGRAM N/A 03/28/2014   Procedure: LEFT AND RIGHT HEART CATHETERIZATION WITH CORONARY ANGIOGRAM;  Surgeon: Peter M [place], MD;  Location: [facility] CATH LAB;  Service: Cardiovascular;  Laterality: N/A;   SPLENECTOMY  07/20/2005    Family History: Family History  Problem Relation Age of Onset   Heart failure Mother    Cirrhosis Father        ETOH   Heart disease Brother    Diabetes Daughter    Breast cancer Maternal Aunt    Colon cancer Neg Hx     Social History: Social History   Socioeconomic History   Marital status: Single    Spouse name: Not on file   Number of children: 3   Years of education: 12   Highest education level: Some college, no degree  Occupational History   Occupation:  disabled    Associate Professor: NOT EMPLOYED  Tobacco Use   Smoking status: Former    Current packs/day: 0.00    Average packs/day: 0.3 packs/day for 12.0 years (3.0 ttl pk-yrs)    Types: Cigarettes    Start date: 09/20/1966    Quit date: 09/20/1978    Years since quitting: 45.7   Smokeless tobacco: Never   Tobacco comments:    Former smoker 05/15/24  Vaping Use   Vaping status: Never Used  Substance and Sexual Activity   Alcohol use: No    Alcohol/week: 0.0 standard drinks of alcohol   Drug use: No   Sexual activity: Not Currently  Other Topics Concern   Not on file  Social History Narrative   Lives Alone       Social Drivers of Health   Financial Resource Strain: Low Risk  (03/21/2024)   Overall Financial Resource Strain (CARDIA)    Difficulty of Paying Living Expenses: Not hard at all  Food Insecurity: Food Insecurity Present (04/23/2024)   Hunger Vital Sign    Worried About Running Out of Food in the Last Year: Never true    Ran Out of Food in the Last Year: Sometimes true  Transportation Needs: No Transportation Needs (04/23/2024)   PRAPARE - Administrator, Civil Service (Medical): No    Lack of Transportation (Non-Medical): No  Physical Activity: Sufficiently Active (03/21/2024)   Exercise Vital Sign    Days of Exercise per Week: 7 days    Minutes of Exercise per Session: 30 min  Stress: No Stress Concern Present (03/21/2024)   [employer] of Occupational Health - Occupational Stress Questionnaire    Feeling of Stress: Not at all  Social Connections: Moderately Integrated (03/21/2024)   Social Connection and Isolation Panel    Frequency of Communication with Friends and Family: More than three times a week    Frequency of Social Gatherings with Friends and Family: More than three times a week    Attends Religious Services: More than 4 times per year    Active Member of [employer] or Organizations: Yes    Attends Banker Meetings: More than 4 times per year     Marital  Status: Divorced    Allergies:  Allergies  Allergen Reactions   Other Other (See Comments)    FLAGYL , CIPRO , PROTONIX  taken at the same time caused patient to lose her breath and have trouble breathing, requiring hospital stay at [facility], 03/23/14-per patient.    Fentanyl  Nausea Only    Room was spinning prefers not to take it   Lisinopril  Cough    Objective:    Vital Signs:   Temp:  [97.2 F (36.2 C)-97.8 F (36.6 C)] 97.5 F (36.4 C) (09/29 0920) Pulse Rate:  [74-97] 95 (09/29 0930) Resp:  [11-31] 22 (09/29 0930) BP: (68-106)/(43-81) 93/71 (09/29 0930) SpO2:  [89 %-100 %] 100 % (09/29 0930)    Weight change: There were no vitals filed for this visit.  Intake/Output:  Intake/Output Summary (Last 24 hours) at 06/18/2024 1034 Last data filed at 06/18/2024 0443 Gross per 24 hour  Intake --  Output 250 ml  Net -250 ml    Physical Exam    General: Well appearing. No distress on RA Cardiac: JVP to ear. S1 and S2 present. No murmurs. Abdomen: Soft, non-tender, distended.  Extremities: Cool and dry.  2+ BLE edema.  Neuro: Drowsy  Telemetry   AF in 70-90s with intermittent pacing (personally reviewed)  Labs   Basic Metabolic Panel: Recent Labs  Lab 06/12/24 1638 06/17/24 1813 06/17/24 1942 06/18/24 0152 06/18/24 0630  NA 138 135 138 136 137  K 4.5 4.5 4.7 4.9 4.9  CL 104 105 105  --  107  CO2 18* 17*  --   --  17*  GLUCOSE 115* 174* 164*  --  155*  BUN 36* 45* 52*  --  45*  CREATININE 1.45* 1.59* 1.70*  --  1.62*  CALCIUM  9.1 8.7*  --   --  8.6*  MG  --   --   --   --  2.0    Liver Function Tests: Recent Labs  Lab 06/17/24 1813  AST 14*  ALT 12  ALKPHOS 41  BILITOT 1.1  PROT 6.8  ALBUMIN 3.0*   No results for input(s): LIPASE, AMYLASE in the last 168 hours. No results for input(s): AMMONIA in the last 168 hours.  CBC: Recent Labs  Lab 06/12/24 1638 06/17/24 1813 06/17/24 1942 06/18/24 0152 06/18/24 0630  WBC 4.5 4.5  --   --   4.5  HGB 12.0 11.9* 13.6 12.9 12.1  HCT 37.1 35.5* 40.0 38.0 35.3*  MCV 89 83.3  --   --  82.1  PLT 214 110*  --   --  90*    Cardiac Enzymes: No results for input(s): CKTOTAL, CKMB, CKMBINDEX, TROPONINI in the last 168 hours.  BNP: BNP (last 3 results) Recent Labs    06/17/24 1813  BNP 1,998.5*    ProBNP (last 3 results) No results for input(s): PROBNP in the last 8760 hours.  CBG: Recent Labs  Lab 06/18/24 0114 06/18/24 0932  GLUCAP 170* 148*   Coagulation Studies: No results for input(s): LABPROT, INR in the last 72 hours.   Imaging   US  EKG SITE RITE Result Date: 06/18/2024 If Site Rite image not attached, placement could not be confirmed due to current cardiac rhythm.  CT Angio Chest PE W and/or Wo Contrast Result Date: 06/17/2024 CLINICAL DATA:  Chest pain, short of breath EXAM: CT ANGIOGRAPHY CHEST WITH CONTRAST TECHNIQUE: Multidetector CT imaging of the chest was performed using the standard protocol during bolus administration of intravenous contrast. Multiplanar CT image reconstructions and  MIPs were obtained to evaluate the vascular anatomy. RADIATION DOSE REDUCTION: This exam was performed according to the departmental dose-optimization program which includes automated exposure control, adjustment of the mA and/or kV according to patient size and/or use of iterative reconstruction technique. CONTRAST:  75mL OMNIPAQUE  IOHEXOL  350 MG/ML SOLN COMPARISON:  06/17/2024 FINDINGS: Cardiovascular: This is a technically adequate evaluation of the pulmonary vasculature. No filling defects or pulmonary emboli. The heart is enlarged without pericardial effusion. Multi lead cardiac pacer/AICD is identified. Normal caliber of the thoracic aorta. Atherosclerosis of the aorta and coronary vasculature. Mediastinum/Nodes: Heterogeneous enlargement of the right lobe of the thyroid , previously evaluated by ultrasound and fine-needle aspiration. Trachea and esophagus are  unremarkable. No pathologic adenopathy. Lungs/Pleura: Right pleural effusion, volume estimated less than 1 L. Trace left pleural effusion. Patchy right basilar consolidation may reflect atelectasis or airspace disease. There is bibasilar bronchial wall thickening, with partial opacification of the lower lobe bronchial tree consistent with retained secretions or aspiration. No pneumothorax. Upper Abdomen: Subtle nodularity of the liver contour or and hypertrophy left lobe liver, suggesting underlying cirrhosis. Small volume ascites within the upper abdomen. Musculoskeletal: No acute or destructive bony abnormalities. Reconstructed images demonstrate no additional findings. Review of the MIP images confirms the above findings. IMPRESSION: 1. No evidence of pulmonary embolus. 2. Cardiomegaly without pericardial effusion. 3. Bilateral pleural effusions, right greater than left. 4. Bibasilar bronchial wall thickening, with segmental opacification of the lower lobe bronchial tree. Findings may reflect retained secretions or aspiration. 5. Patchy right basilar consolidation may reflect atelectasis or airspace disease. 6. Aortic Atherosclerosis (ICD10-I70.0). Coronary artery atherosclerosis. 7. Cirrhotic morphology of the liver, with small volume upper abdominal ascites. Electronically Signed   By: Ozell Daring M.D.   On: 06/17/2024 22:34   DG Chest 2 View Result Date: 06/17/2024 EXAM: 2 VIEW(S) XRAY OF THE CHEST 06/17/2024 06:48:41 PM COMPARISON: None available. CLINICAL HISTORY: sob, chest pain. Per chart - PT c/o onset chest pain, weakness and increased sob this morning. Pt scheduled for cardioversion on Tuesday to correct afib. FINDINGS: LINES, TUBES AND DEVICES: Neuro stimulator over right chest. Left subclavian approach cardiac rhythm maintenance device in place. LUNGS AND PLEURA: Bibasilar and left midlung airspace opacities favoring atelectasis. Pulmonary vascular congestion. Small right pleural effusion.  Elevated right hemidiaphragm. Trace left pleural effusion. No pneumothorax. HEART AND MEDIASTINUM: Cardiomegaly. Aortic atherosclerosis. BONES AND SOFT TISSUES: No acute osseous abnormality. IMPRESSION: 1. Cardiomegaly with pulmonary vascular congestion and small right pleural effusion; trace left pleural effusion. Electronically signed by: Norman Gatlin MD 06/17/2024 06:54 PM EDT RP Workstation: [id number]     Medications:    Current Medications:  apixaban   5 mg Oral BID   atorvastatin   80 mg Oral Daily   insulin  aspart  0-5 Units Subcutaneous QHS   insulin  aspart  0-9 Units Subcutaneous TID WC   levothyroxine   50 mcg Oral Q0600   midodrine  10 mg Oral TID WC   sodium chloride  flush  3 mL Intravenous Q12H    Infusions:  cefTRIAXone  (ROCEPHIN )  IV     milrinone      Patient Profile   [name] is a 73 y.o.  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  Assessment/Plan   Acute on Chronic Systolic and Diastolic Heart Failure - mixed ischemic, nonischemic, EF initially down to 25-30% in 2015, LV dysfunction felt to be out of proportion to CAD.  EF has been stable in 30-35% - would favor getting back to NSR, suspect that decompensation related to atrial fibrillation - LA normal in ED, diuresis has been limited by hypotension and worsening renal function concerning for low ouput - echo today pending read - place PICC line for CVP/co-ox monitoring - start milrinone 0.25 mcg/kg/min - start lasix  80 mg IV bid - GDMT limited by hypotension - stop midodrine, can use NE if needed - place UNNA boots  Persistent Atrial Fibrillation - new onset 8/25 - rate controlled - continue eliquis  5 mg bid - start amio gtt - may need DCCV after diuresed if remains in AF  AKI - cardio-renal  - Cr 1.59 on admit, worsened in the setting of low output - plan as above - daily  BMET  CAD - LHC in 2015 showed 99% RCA occlusion with collaterals - hs-trop 35>35, not concerning for ACS - continue atorva 80 mg daily  Length of Stay: 1  [place] Lee, NP  06/18/2024, 10:34 AM  Advanced Heart Failure Team Pager [phone] (M-F; 7a - 5p)  Please contact [facility] for night-coverage after hours (4p -7a ) and weekends on [email or web address]  Agree with above.   73 yo woman with obesity, CAD, OSA systolic HF.   Now admitted with low output HF and marked volume overload in setting of recent AF  Start empiric milrinone, lasix  gtt and metolazone. Place PICC to guide therapies.   Start IV amio. Continue Eliquis  Plan DC-CV if doesn't convert.   CRITICAL CARE Performed by: Cherrie Sieving  Total critical care time: 50 minutes  Critical care time was exclusive of separately billable procedures and treating other patients.  Critical care was necessary to treat or prevent imminent or life-threatening deterioration.  Critical care was time spent personally by me (independent of midlevel providers or residents) on the following activities: development of treatment plan with patient and/or surrogate as well as nursing, discussions with consultants, evaluation of patient's response to treatment, examination of patient, obtaining history from patient or surrogate, ordering and performing treatments and interventions, ordering and review of laboratory studies, ordering and review of radiographic studies, pulse oximetry and re-evaluation of patient's condition.  Sieving Cherrie, MD  11:29 PM

## 2024-06-18 NOTE — Hospital Course (Addendum)
 73 y.o. with history of combined systolic and diastolic heart failure s/p BiV ICD, CAD, OSA on bipap, LBBB, HTN, hypotension, and atrial fibrillation.  - Admitted with volume overload, AKI - Echo 9/30 noted EF 20-25%, severe MR, moderately reduced RV - RHC 9/30> elevated PA pressures and filling pressures, PA PI of 1.6 - Treated with inotropes and vasopressors in ICU, now off - Complicated by worsening renal failure, cardiorenal syndrome, failed high-dose diuretic challenge, then started on CRRT from 9/30-10/10 -sp cardioversion for A-fib -Palliative care consulted and following - 10/11 initiated hemodialysis - Transferred out of ICU to [facility] service 10/14  10/20 pending HD and SNF arrangement  10/21 tolerating well HD.

## 2024-06-18 NOTE — Progress Notes (Signed)
 Patient resting comfortably on [place] this AM.  Bipap is ordered for at bedtime and for PRN use.  Currently not indicated at this time.  Will continue to monitor and assess for bipap needs.

## 2024-06-18 NOTE — Progress Notes (Signed)
 Pt became hypotensive--> needing IVF  -> HOLD further doses of IV lasix  -> LACtic acid is normal -> Legs are warm- I do not believe this is cardiogenic shock. -> Pt is also being treated for PNA   -> will follow along

## 2024-06-18 NOTE — ED Notes (Addendum)
RT called to bedside for evaluation

## 2024-06-18 NOTE — Progress Notes (Signed)
 Heart Failure Navigator Progress Note  Assessed for Heart & Vascular TOC clinic readiness.  Patient does not meet criteria due to Advanced Heart Failure Team consulted..   Navigator will sign off at this time.    Stephane Haddock, BSN, Scientist, clinical (histocompatibility and immunogenetics) Only

## 2024-06-18 NOTE — Progress Notes (Signed)
 Peripherally Inserted Central Catheter Placement  The IV Nurse has discussed with the patient and/or persons authorized to consent for the patient, the purpose of this procedure and the potential benefits and risks involved with this procedure.  The benefits include less needle sticks, lab draws from the catheter, and the patient may be discharged home with the catheter. Risks include, but not limited to, infection, bleeding, blood clot (thrombus formation), and puncture of an artery; nerve damage and irregular heartbeat and possibility to perform a PICC exchange if needed/ordered by physician.  Alternatives to this procedure were also discussed.  Bard Power PICC patient education guide, fact sheet on infection prevention and patient information card has been provided to patient /or left at bedside.  PICC inserted by Leita Shipper, RN   PICC Placement Documentation  PICC Double Lumen 06/18/24 Right Basilic 41 cm 0 cm (Active)  Indication for Insertion or Continuance of Line Vasoactive infusions 06/18/24 1801  Exposed Catheter (cm) 0 cm 06/18/24 1801  Site Assessment Clean, Dry, Intact 06/18/24 1801  Lumen #1 Status Flushed;Saline locked;Blood return noted 06/18/24 1801  Lumen #2 Status Flushed;Saline locked;Blood return noted 06/18/24 1801  Dressing Type Transparent;Securing device 06/18/24 1801  Dressing Status Antimicrobial disc/dressing in Best;Clean, Dry, Intact 06/18/24 1801  Line Care Connections checked and tightened 06/18/24 1801  Line Adjustment (NICU/IV Team Only) No 06/18/24 1801  Dressing Intervention New dressing;Adhesive placed at insertion site (IV team only) 06/18/24 1801  Dressing Change Due 06/25/24 06/18/24 1801       [name], [name] 06/18/2024, 6:01 PM

## 2024-06-19 ENCOUNTER — Encounter (HOSPITAL_COMMUNITY): Admission: RE | Payer: Self-pay | Source: Home / Self Care

## 2024-06-19 ENCOUNTER — Encounter (HOSPITAL_COMMUNITY): Admission: EM | Disposition: E | Payer: Self-pay | Source: Home / Self Care | Attending: Cardiology

## 2024-06-19 ENCOUNTER — Encounter (HOSPITAL_COMMUNITY): Payer: Self-pay | Admitting: Internal Medicine

## 2024-06-19 ENCOUNTER — Ambulatory Visit: Admitting: Internal Medicine

## 2024-06-19 ENCOUNTER — Ambulatory Visit (HOSPITAL_COMMUNITY): Admission: RE | Admit: 2024-06-19 | Source: Home / Self Care | Admitting: Cardiology

## 2024-06-19 DIAGNOSIS — N179 Acute kidney failure, unspecified: Secondary | ICD-10-CM | POA: Diagnosis not present

## 2024-06-19 DIAGNOSIS — I5043 Acute on chronic combined systolic (congestive) and diastolic (congestive) heart failure: Secondary | ICD-10-CM | POA: Diagnosis not present

## 2024-06-19 DIAGNOSIS — I10 Essential (primary) hypertension: Secondary | ICD-10-CM | POA: Diagnosis not present

## 2024-06-19 DIAGNOSIS — I5023 Acute on chronic systolic (congestive) heart failure: Secondary | ICD-10-CM | POA: Diagnosis not present

## 2024-06-19 DIAGNOSIS — R570 Cardiogenic shock: Secondary | ICD-10-CM | POA: Diagnosis not present

## 2024-06-19 DIAGNOSIS — I4819 Other persistent atrial fibrillation: Secondary | ICD-10-CM | POA: Diagnosis not present

## 2024-06-19 DIAGNOSIS — I2510 Atherosclerotic heart disease of native coronary artery without angina pectoris: Secondary | ICD-10-CM | POA: Diagnosis not present

## 2024-06-19 DIAGNOSIS — E8779 Other fluid overload: Secondary | ICD-10-CM | POA: Diagnosis not present

## 2024-06-19 DIAGNOSIS — E871 Hypo-osmolality and hyponatremia: Secondary | ICD-10-CM | POA: Diagnosis not present

## 2024-06-19 HISTORY — PX: RIGHT HEART CATH: CATH118263

## 2024-06-19 HISTORY — PX: TEMPORARY DIALYSIS CATHETER: CATH118312

## 2024-06-19 LAB — BASIC METABOLIC PANEL WITH GFR
Anion gap: 12 (ref 5–15)
BUN: 47 mg/dL — ABNORMAL HIGH (ref 8–23)
CO2: 18 mmol/L — ABNORMAL LOW (ref 22–32)
Calcium: 8.5 mg/dL — ABNORMAL LOW (ref 8.9–10.3)
Chloride: 103 mmol/L (ref 98–111)
Creatinine, Ser: 2.27 mg/dL — ABNORMAL HIGH (ref 0.44–1.00)
GFR, Estimated: 22 mL/min — ABNORMAL LOW (ref >60)
Glucose, Bld: 193 mg/dL — ABNORMAL HIGH (ref 70–99)
Potassium: 4.5 mmol/L (ref 3.5–5.1)
Sodium: 133 mmol/L — ABNORMAL LOW (ref 135–145)

## 2024-06-19 LAB — GLUCOSE, CAPILLARY
Glucose-Capillary: 180 mg/dL — ABNORMAL HIGH (ref 70–99)
Glucose-Capillary: 167 mg/dL — ABNORMAL HIGH (ref 70–99)
Glucose-Capillary: 229 mg/dL — ABNORMAL HIGH (ref 70–99)
Glucose-Capillary: 166 mg/dL — ABNORMAL HIGH (ref 70–99)
Glucose-Capillary: 151 mg/dL — ABNORMAL HIGH (ref 70–99)

## 2024-06-19 LAB — MAGNESIUM: Magnesium: 2 mg/dL (ref 1.7–2.4)

## 2024-06-19 LAB — RENAL FUNCTION PANEL
Albumin: 2.9 g/dL — ABNORMAL LOW (ref 3.5–5.0)
Anion gap: 13 (ref 5–15)
BUN: 44 mg/dL — ABNORMAL HIGH (ref 8–23)
CO2: 18 mmol/L — ABNORMAL LOW (ref 22–32)
Calcium: 8.2 mg/dL — ABNORMAL LOW (ref 8.9–10.3)
Chloride: 103 mmol/L (ref 98–111)
Creatinine, Ser: 2.23 mg/dL — ABNORMAL HIGH (ref 0.44–1.00)
GFR, Estimated: 22 mL/min — ABNORMAL LOW (ref >60)
Glucose, Bld: 167 mg/dL — ABNORMAL HIGH (ref 70–99)
Phosphorus: 4.5 mg/dL (ref 2.5–4.6)
Potassium: 4.3 mmol/L (ref 3.5–5.1)
Sodium: 134 mmol/L — ABNORMAL LOW (ref 135–145)

## 2024-06-19 LAB — COOXEMETRY PANEL
Carboxyhemoglobin: 1.5 % (ref 0.5–1.5)
Methemoglobin: <0.7 % (ref 0.0–1.5)
O2 Saturation: 72.4 %
Total hemoglobin: 10.9 g/dL — ABNORMAL LOW (ref 12.0–16.0)

## 2024-06-19 LAB — CBC
HCT: 32.1 % — ABNORMAL LOW (ref 36.0–46.0)
Hemoglobin: 10.7 g/dL — ABNORMAL LOW (ref 12.0–15.0)
MCH: 28 pg (ref 26.0–34.0)
MCHC: 33.3 g/dL (ref 30.0–36.0)
MCV: 84 fL (ref 80.0–100.0)
Platelets: 102 K/uL — ABNORMAL LOW (ref 150–400)
RBC: 3.82 MIL/uL — ABNORMAL LOW (ref 3.87–5.11)
RDW: 19.3 % — ABNORMAL HIGH (ref 11.5–15.5)
WBC: 4.9 K/uL (ref 4.0–10.5)
nRBC: 0 % (ref 0.0–0.2)

## 2024-06-19 SURGERY — CARDIOVERSION (CATH LAB)
Anesthesia: General

## 2024-06-19 SURGERY — RIGHT HEART CATH
Anesthesia: LOCAL

## 2024-06-19 MED ORDER — SODIUM CHLORIDE 0.9 % IV SOLN: 250.0000 mL | INTRAVENOUS | Status: AC | PRN

## 2024-06-19 MED ORDER — NOREPINEPHRINE 4 MG/250ML-% IV SOLN
0.0000 ug/min | INTRAVENOUS | Status: DC
  Administered 2024-06-19: 5 ug/min via INTRAVENOUS
  Administered 2024-06-20: 8 ug/min via INTRAVENOUS
  Administered 2024-06-20: 9 ug/min via INTRAVENOUS
  Administered 2024-06-21: 5 ug/min via INTRAVENOUS
  Administered 2024-06-21: 6 ug/min via INTRAVENOUS
  Filled 2024-06-19 (×5): qty 250

## 2024-06-19 MED ORDER — ORAL CARE MOUTH RINSE: 15.0000 mL | OROMUCOSAL | Status: DC | PRN

## 2024-06-19 MED ORDER — SODIUM CHLORIDE 0.9% FLUSH
3.0000 mL | Freq: Two times a day (BID) | INTRAVENOUS | Status: DC
  Administered 2024-06-19 – 2024-07-13 (×31): 3 mL via INTRAVENOUS

## 2024-06-19 MED ORDER — LIDOCAINE HCL (PF) 1 % IJ SOLN
INTRAMUSCULAR | Status: AC
  Filled 2024-06-19: qty 30

## 2024-06-19 MED ORDER — PRISMASOL BGK 4/2.5 32-4-2.5 MEQ/L EC SOLN: Status: DC

## 2024-06-19 MED ORDER — FUROSEMIDE 10 MG/ML IJ SOLN: 20.0000 mg/h | INTRAVENOUS | Status: DC

## 2024-06-19 MED ORDER — HEPARIN (PORCINE) IN NACL 1000-0.9 UT/500ML-% IV SOLN
INTRAVENOUS | Status: DC | PRN
  Administered 2024-06-19 (×2): 500 mL

## 2024-06-19 MED ORDER — HEPARIN SODIUM (PORCINE) 1000 UNIT/ML DIALYSIS
1000.0000 [IU] | INTRAMUSCULAR | Status: DC | PRN
  Administered 2024-06-19: 2800 [IU] via INTRAVENOUS_CENTRAL
  Administered 2024-06-28: 3000 [IU] via INTRAVENOUS_CENTRAL
  Filled 2024-06-19: qty 4
  Filled 2024-06-19: qty 6
  Filled 2024-06-19: qty 4

## 2024-06-19 MED ORDER — LIDOCAINE HCL (PF) 1 % IJ SOLN
INTRAMUSCULAR | Status: DC | PRN
  Administered 2024-06-19: 2 mL via INTRADERMAL
  Administered 2024-06-19: 5 mL via INTRADERMAL

## 2024-06-19 MED ORDER — SODIUM CHLORIDE 0.9 % FOR CRRT: INTRAVENOUS_CENTRAL | Status: DC | PRN

## 2024-06-19 MED ORDER — INSULIN ASPART 100 UNIT/ML IJ SOLN
0.0000 [IU] | Freq: Three times a day (TID) | INTRAMUSCULAR | Status: DC
  Administered 2024-06-19: 3 [IU] via SUBCUTANEOUS
  Administered 2024-06-19: 5 [IU] via SUBCUTANEOUS
  Administered 2024-06-20 (×2): 3 [IU] via SUBCUTANEOUS
  Administered 2024-06-20: 2 [IU] via SUBCUTANEOUS
  Administered 2024-06-21 (×3): 3 [IU] via SUBCUTANEOUS
  Administered 2024-06-22 (×2): 5 [IU] via SUBCUTANEOUS

## 2024-06-19 MED ORDER — FUROSEMIDE 10 MG/ML IJ SOLN
20.0000 mg/h | INTRAMUSCULAR | Status: DC
  Filled 2024-06-19: qty 20

## 2024-06-19 MED ORDER — SODIUM CHLORIDE 0.9 % IV SOLN
12.5000 mg | Freq: Four times a day (QID) | INTRAVENOUS | Status: DC | PRN
  Administered 2024-06-19 – 2024-06-20 (×3): 12.5 mg via INTRAVENOUS
  Filled 2024-06-19: qty 12.5
  Filled 2024-06-19: qty 0.5
  Filled 2024-06-19: qty 12.5

## 2024-06-19 MED ORDER — HEPARIN SODIUM (PORCINE) 1000 UNIT/ML IJ SOLN
INTRAMUSCULAR | Status: AC
  Filled 2024-06-19: qty 10

## 2024-06-19 MED ORDER — SODIUM CHLORIDE 0.9% FLUSH: 3.0000 mL | INTRAVENOUS | Status: DC | PRN

## 2024-06-19 MED ORDER — HEPARIN (PORCINE) 25000 UT/250ML-% IV SOLN
1050.0000 [IU]/h | INTRAVENOUS | Status: DC
  Administered 2024-06-19: 1050 [IU]/h via INTRAVENOUS
  Administered 2024-06-20 – 2024-06-22 (×2): 900 [IU]/h via INTRAVENOUS
  Administered 2024-06-24 – 2024-06-27 (×4): 1050 [IU]/h via INTRAVENOUS
  Filled 2024-06-19 (×9): qty 250

## 2024-06-19 SURGICAL SUPPLY — 14 items
CATH SWAN GANZ 7F STRAIGHT (CATHETERS) IMPLANT
ELECT DEFIB PAD ADLT CADENCE (PAD) IMPLANT
GLIDESHEATH SLEND A-KIT 6F 22G (SHEATH) IMPLANT
GUIDEWIRE INQWIRE 1.5J.035X260 (WIRE) IMPLANT
GUIDEWIRE SAFE TJ AMPLATZ EXST (WIRE) IMPLANT
KIT FULL HD TRIALYSIS 13X20 (MISCELLANEOUS) IMPLANT
KIT MICROPUNCTURE NIT STIFF (SHEATH) IMPLANT
MAT PREVALON FULL STRYKER (MISCELLANEOUS) IMPLANT
PACK CARDIAC CATHETERIZATION (CUSTOM PROCEDURE TRAY) ×2 IMPLANT
SHEATH PINNACLE 7F 10CM (SHEATH) IMPLANT
SHEATH PROBE COVER 6X72 (BAG) IMPLANT
SHIELD CATH-GARD CONTAMINATION (MISCELLANEOUS) IMPLANT
TRANSDUCER W/STOPCOCK (MISCELLANEOUS) IMPLANT
TUBING ART PRESS 72 MALE/FEM (TUBING) IMPLANT

## 2024-06-19 NOTE — [place] FL2 (Signed)
 [place]  MEDICAID FL2 LEVEL OF CARE FORM     IDENTIFICATION  Patient Name: [name] Birthdate: 1951-03-29 Sex: female Admission Date (Current Location): 06/17/2024  [facility] and [place] Number:  Producer, television/film/video and Address:  [facility] [place], [address], [place], [place] [address]      Provider Number: [id number]  Attending Physician Name and Address:  Cherrie Toribio SAUNDERS, MD  Relative Name and Phone Number:  [place], Matuszak [facility]) [phone]    Current Level of Care: Hospital Recommended Level of Care: Skilled Nursing Facility Prior Approval Number:    Date Approved/Denied:   PASRR Number: [id number]  Discharge Plan: SNF    Current Diagnoses: Patient Active Problem List   Diagnosis Date Noted   Hypotension 06/18/2024   AKI (acute kidney injury) 06/18/2024   Acute on chronic congestive heart failure (HCC) 06/18/2024   Acute on chronic heart failure with mildly reduced ejection fraction (HFmrEF, 41-49%) (HCC) 06/17/2024   Persistent atrial fibrillation (HCC) 06/12/2024   Acute on chronic combined systolic and diastolic congestive heart failure (HCC) 12/13/2023   Hospital discharge follow-up 12/13/2023   Dyspnea 12/13/2023   Influenza 11/17/2023   Type 2 diabetes mellitus with hyperglycemia, with long-term current use of insulin  (HCC) 08/04/2023   Acute mucoid otitis media of both ears 08/04/2023   Hyperlipidemia associated with type 2 diabetes mellitus (HCC) 06/17/2023   URTI (acute upper respiratory infection) 09/27/2022   Gait abnormality 09/27/2022   OSA (obstructive sleep apnea) 05/01/2020   Obesity 05/01/2020   Hypothyroidism 05/01/2020   Cardiomyopathy, ischemic 04/04/2014   Hypertension associated with diabetes (HCC) 04/04/2014   Chronic combined systolic and diastolic CHF (congestive heart failure) (HCC) 04/04/2014   Coronary artery disease 03/31/2014   Type 2 diabetes mellitus with diabetic neuropathy (HCC) 03/31/2014    DCM (dilated cardiomyopathy) (HCC) 03/29/2014   Anemia 03/26/2014   Cardiogenic shock (HCC) 03/23/2014   Consolidation of right lower lobe of lung 03/23/2014   Gastroparesis 02/14/2014   Chronic constipation 11/30/2012   GERD (gastroesophageal reflux disease) 11/30/2012   Fatty liver 11/30/2012    Orientation RESPIRATION BLADDER Height & Weight     Self, Time, Situation, Place  O2 Continent Weight: 215 lb 9.8 oz (97.8 kg) Height:     BEHAVIORAL SYMPTOMS/MOOD NEUROLOGICAL BOWEL NUTRITION STATUS      Incontinent Diet (See dc summary)  AMBULATORY STATUS COMMUNICATION OF NEEDS Skin     Verbally Normal                       Personal Care Assistance Level of Assistance  Bathing, Feeding, Dressing           Functional Limitations Info  Sight, Speech, Hearing Sight Info: Adequate Hearing Info: Adequate Speech Info: Adequate    SPECIAL CARE FACTORS FREQUENCY  PT (By licensed PT), OT (By licensed OT)     PT Frequency: 5x weekly OT Frequency: 5x weekly            Contractures      Additional Factors Info  Code Status, Allergies Code Status Info: Full Allergies Info: Fentanyl ; Lisinopril ;FLAGYL , CIPRO , PROTONIX  taken at the same time caused patient to lose her breath and have trouble breathing           Current Medications (06/19/2024):  This is the current hospital active medication list Current Facility-Administered Medications  Medication Dose Route Frequency Provider Last Rate Last Admin   [MAR Hold] acetaminophen  (TYLENOL ) tablet 650 mg  650 mg Oral Q6H PRN Patel, Vishal R, MD       Or   ILDA Hold] acetaminophen  (TYLENOL ) suppository 650 mg  650 mg Rectal Q6H PRN Patel, Vishal R, MD       [MAR Hold] albuterol  (PROVENTIL ) (2.5 MG/3ML) 0.083% nebulizer solution 2.5 mg  2.5 mg Nebulization Q6H PRN Patel, Vishal R, MD   2.5 mg at 06/18/24 0143   amiodarone (NEXTERONE PREMIX) 360-4.14 MG/200ML-% (1.8 mg/mL) IV infusion  30 mg/hr Intravenous Continuous Lee,  [place], NP 16.67 mL/hr at 06/19/24 1200 30 mg/hr at 06/19/24 1200   [MAR Hold] apixaban  (ELIQUIS ) tablet 5 mg  5 mg Oral BID Patel, Vishal R, MD   5 mg at 06/19/24 1001   [MAR Hold] atorvastatin  (LIPITOR ) tablet 80 mg  80 mg Oral Daily Patel, Vishal R, MD   80 mg at 06/19/24 1001   [MAR Hold] bisacodyl (DULCOLAX) EC tablet 5 mg  5 mg Oral Daily PRN Patel, Vishal R, MD       [MAR Hold] cefTRIAXone  (ROCEPHIN ) 2 g in sodium chloride  0.9 % 100 mL IVPB  2 g Intravenous Q24H Simpson, Paula B, NP   Stopped at 06/18/24 2206   [MAR Hold] Chlorhexidine  Gluconate Cloth 2 % PADS 6 each  6 each Topical Daily Lee, [place], NP   6 each at 06/19/24 1032   furosemide  (LASIX ) 200 mg in dextrose  5 % 100 mL (2 mg/mL) infusion  20 mg/hr Intravenous Continuous Lee, [place], NP       Heparin  (Porcine) in NaCl 1000-0.9 UT/500ML-% SOLN    PRN Bensimhon, Daniel R, MD   500 mL at 06/19/24 1420   [MAR Hold] insulin  aspart (novoLOG ) injection 0-15 Units  0-15 Units Subcutaneous TID WC Lee, Jordan, NP   5 Units at 06/19/24 1247   [MAR Hold] insulin  aspart (novoLOG ) injection 0-5 Units  0-5 Units Subcutaneous QHS Patel, Vishal R, MD       [facility] Hold] levothyroxine  (SYNTHROID ) tablet 50 mcg  50 mcg Oral Q0600 Patel, Vishal R, MD   50 mcg at 06/19/24 9392   lidocaine  (PF) (XYLOCAINE ) 1 % injection    PRN Bensimhon, Daniel R, MD   2 mL at 06/19/24 1410   milrinone (PRIMACOR) 20 MG/100 ML (0.2 mg/mL) infusion  0.375 mcg/kg/min Intravenous Continuous Lee, [place], NP 10.53 mL/hr at 06/19/24 1200 0.375 mcg/kg/min at 06/19/24 1200   [MAR Hold] ondansetron  (ZOFRAN ) tablet 4 mg  4 mg Oral Q6H PRN Patel, Vishal R, MD       Or   ILDA Hold] ondansetron  (ZOFRAN ) injection 4 mg  4 mg Intravenous Q6H PRN Patel, Vishal R, MD   4 mg at 06/19/24 1002   [MAR Hold] Oral care mouth rinse  15 mL Mouth Rinse PRN Bensimhon, Toribio SAUNDERS, MD       [facility] Hold] promethazine  (PHENERGAN ) 12.5 mg in sodium chloride  0.9 % 50 mL IVPB  12.5 mg Intravenous Q6H PRN Lee,  [place], NP   Stopped at 06/19/24 1121   [MAR Hold] senna-docusate (Senokot-S) tablet 1 tablet  1 tablet Oral QHS PRN Patel, Vishal R, MD       [MAR Hold] sodium chloride  flush (NS) 0.9 % injection 10-40 mL  10-40 mL Intracatheter Q12H Bensimhon, Toribio SAUNDERS, MD   10 mL at 06/19/24 1033   [MAR Hold] sodium chloride  flush (NS) 0.9 % injection 10-40 mL  10-40 mL Intracatheter PRN Bensimhon, Toribio SAUNDERS, MD       [facility] Hold] sodium chloride  flush (NS) 0.9 %  injection 3 mL  3 mL Intravenous Q12H Patel, Vishal R, MD   3 mL at 06/19/24 1033     Discharge Medications: Please see discharge summary for a list of discharge medications.  Relevant Imaging Results:  Relevant Lab Results:   Additional Information SSN: [id number]  [name], LCSWA

## 2024-06-19 NOTE — TOC Progression Note (Signed)
 Transition of Care [facility]) - Progression Note    Patient Details  Name: [name] MRN: [id number] Date of Birth: 22-Oct-1950  [facility] CM/SW Contact  [name] Phone Number: [phone] 06/19/2024, 2:31 PM  Clinical Narrative:   HF CSW completed the patients passr and FL2. Patient will be faxed out once PT/OT complete their evaluation.  HF CSW/NCM will continue to follow and monitor for dc needs.     Expected Discharge Plan: Skilled Nursing Facility Barriers to Discharge: Continued Medical Work up               Expected Discharge Plan and Services   Discharge Planning Services: CM Consult Post Acute Care Choice: Skilled Nursing Facility Living arrangements for the past 2 months: Single Family Home                                       Social Drivers of Health (SDOH) Interventions SDOH Screenings   Food Insecurity: No Food Insecurity (06/18/2024)  Recent Concern: Food Insecurity - Food Insecurity Present (04/23/2024)  Housing: Low Risk  (06/18/2024)  Transportation Needs: No Transportation Needs (06/18/2024)  Utilities: Not At Risk (06/18/2024)  Alcohol Screen: Low Risk  (03/21/2024)  Depression (PHQ2-9): Low Risk  (04/23/2024)  Financial Resource Strain: Low Risk  (03/21/2024)  Physical Activity: Sufficiently Active (03/21/2024)  Social Connections: Moderately Integrated (06/18/2024)  Stress: No Stress Concern Present (03/21/2024)  Tobacco Use: Medium Risk (06/17/2024)  Health Literacy: Adequate Health Literacy (03/21/2024)    Readmission Risk Interventions     No data to display

## 2024-06-19 NOTE — Progress Notes (Signed)
 PHARMACY - ANTICOAGULATION CONSULT NOTE  Pharmacy Consult:  Heparin  Indication: atrial fibrillation  Allergies  Allergen Reactions   Other Other (See Comments)    FLAGYL , CIPRO , PROTONIX  taken at the same time caused patient to lose her breath and have trouble breathing, requiring hospital stay at [facility], 03/23/14-per patient.    Fentanyl  Nausea Only    Room was spinning prefers not to take it   Lisinopril  Cough    Patient Measurements: Weight: 97.8 kg (215 lb 9.8 oz) Heparin  dosing weight = 79 kg  Vital Signs: Temp: 97.6 F (36.4 C) (09/30 1026) Temp Source: Oral (09/30 1026) BP: 112/70 (09/30 1447) Pulse Rate: 117 (09/30 1447)  Labs: Recent Labs    06/17/24 1813 06/17/24 1942 06/17/24 2005 06/18/24 0152 06/18/24 0630 06/19/24 0530  HGB 11.9* 13.6  --  12.9 12.1 10.7*  HCT 35.5* 40.0  --  38.0 35.3* 32.1*  PLT 110*  --   --   --  90* 102*  CREATININE 1.59* 1.70*  --   --  1.62* 2.27*  TROPONINIHS 35*  --  35*  --   --   --     Estimated Creatinine Clearance: 24.8 mL/min (A) (by C-G formula based on SCr of 2.27 mg/dL (H)).   Medical History: Past Medical History:  Diagnosis Date   A-fib (HCC) 06/12/2024   Anemia    Arthritis    CAD (coronary artery disease)    a. LHC (03/2014) L[place]: nl, LAD: diff dz proximal 20%, 30-40% dz mid vessel prior 2nd diagonal, LCx: 40% in OM1, 20-30% in OM2, RCA: 99% subtotal occlusion @ crux, TIMI 1 flow, L-R collaterals to distal vessel   Cardiomyopathy (HCC) 03/28/2014   LV dysfunction out of proportion to CAD 03/28/14   CHF (congestive heart failure) (HCC)    Phreesia 10/03/2020   Chronic systolic heart failure (HCC)    a. ECHO (03/2014): EF 25-30%, akinesis enteroanteroseptal myocardium, grade III DD b. RHC (03/2014) RA 4, RV 42/4, PA 44/14 (26), PCWP 21, PA 62% Fick CO/CI 6.9 / 3.4   Diabetes mellitus    Diabetes mellitus without complication (HCC)    Phreesia 10/03/2020   Duodenal ulcer    Age 73   Dyspnea    Erosive  esophagitis    Gastritis    Hypertension    Hypothyroidism    Obese    Short-term memory loss    Thyroid  disease    TTP (thrombotic thrombocytopenic purpura) (HCC) 06/2005     Assessment: 73 YOF with cardiogenic shock, now requiring CRRT.  Pharmacy consulted to transition from Eliquis  to IV heparin , last dose on 9/30 at 1001.  Will use aPTT to guide heparin  dosing until heparin  level and aPTT correlates.  Hemoglobin trending down, could be dilutional.  Platelet count is improving.  No bleeding reported.  Goal of Therapy:  Heparin  level 0.3-0.7 units/ml aPTT 66-102 seconds Monitor platelets by anticoagulation protocol: Yes   Plan:  At 2200, start IV heparin  at 1050 units/hr Check 8 hr aPTT Daily heparin  level, aPTT and CBC  Kinsey Cowsert D. Lendell, PharmD, BCPS, BCCCP 06/19/2024, 4:21 PM

## 2024-06-19 NOTE — H&P (View-Only) (Signed)
 Advanced Heart Failure Rounding Note  Cardiologist: None  Chief Complaint: CHF Subjective:    Co-ox 72.4 on milrinone 0.25 mcg/kg/min, CVP 15 Made 765cc on Lasix  gtt 20/hr + metolazone 10 mg sCr 1.62 >2.27  Feeling poorly this morning. She has been nauseated and fatigued throughout the night. Tired this morning.   Objective:    Weight Range: 97.8 kg Body mass index is 35.88 kg/m.   Vital Signs:   Temp:  [97.3 F (36.3 C)-97.7 F (36.5 C)] 97.3 F (36.3 C) (09/30 0730) Pulse Rate:  [68-107] 92 (09/30 0700) Resp:  [11-33] 17 (09/30 0700) BP: (83-146)/(54-125) 110/70 (09/30 0700) SpO2:  [82 %-100 %] 98 % (09/30 0700) Weight:  [97.8 kg] 97.8 kg (09/30 0500) Last BM Date : 06/18/24  Weight change: Filed Weights   06/19/24 0500  Weight: 97.8 kg   Intake/Output:  Intake/Output Summary (Last 24 hours) at 06/19/2024 1005 Last data filed at 06/19/2024 0700 Gross per 24 hour  Intake 928.19 ml  Output 765 ml  Net 163.19 ml    Physical Exam    General: Chronically ill appearing Cardiac: JVP ~15. S1 and S2 present. No murmurs. Abdomen: Soft, non-tender, distended.  Extremities: cool, dry.  2-3+ peripheral edema.  Neuro: Drowsy, affect flat   Labs    CBC Recent Labs    06/18/24 0630 06/19/24 0530  WBC 4.5 4.9  HGB 12.1 10.7*  HCT 35.3* 32.1*  MCV 82.1 84.0  PLT 90* 102*   Basic Metabolic Panel Recent Labs    90/70/74 0630 06/19/24 0530  NA 137 133*  K 4.9 4.5  CL 107 103  CO2 17* 18*  GLUCOSE 155* 193*  BUN 45* 47*  CREATININE 1.62* 2.27*  CALCIUM  8.6* 8.5*  MG 2.0 2.0   Liver Function Tests Recent Labs    06/17/24 1813  AST 14*  ALT 12  ALKPHOS 41  BILITOT 1.1  PROT 6.8  ALBUMIN 3.0*   BNP (last 3 results) Recent Labs    06/17/24 1813  BNP 1,998.5*   Medications:    Scheduled Medications:  apixaban   5 mg Oral BID   atorvastatin   80 mg Oral Daily   Chlorhexidine  Gluconate Cloth  6 each Topical Daily   insulin  aspart  0-5  Units Subcutaneous QHS   insulin  aspart  0-9 Units Subcutaneous TID WC   levothyroxine   50 mcg Oral Q0600   sodium chloride  flush  10-40 mL Intracatheter Q12H   sodium chloride  flush  3 mL Intravenous Q12H    Infusions:  amiodarone 30 mg/hr (06/19/24 0700)   cefTRIAXone  (ROCEPHIN )  IV Stopped (06/18/24 2206)   furosemide  (LASIX ) 200 mg in dextrose  5 % 100 mL (2 mg/mL) infusion 20 mg/hr (06/19/24 0700)   milrinone 0.25 mcg/kg/min (06/19/24 0725)    PRN Medications: acetaminophen  **OR** acetaminophen , albuterol , bisacodyl, ondansetron  **OR** ondansetron  (ZOFRAN ) IV, mouth rinse, senna-docusate, sodium chloride  flush  Patient Profile   [name] is a 73 y.o.  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   Assessment/Plan   Acute on Chronic Biventricular Heart Failure  New onset RV Failure - mixed ischemic, nonischemic, EF initially down to 25-30% in 2015, LV dysfunction felt to be out of proportion to CAD. EF has been stable in 30-35% - LA normal in ED, diuresis has been limited by hypotension and worsening renal function concerning for low ouput - would favor getting  back to NSR, suspect that decompensation related to atrial fibrillation - repeat echo with EF 20-25%, mod LVH, D-shaped septum, RV mod reduced, degenerative MR severe MR - co-ox 72%, CVP 15 - increase milrinone to 0.375 mcg/kg/min - poor urine response to Lasix  gtt + metolazone; will stop Lasix  gtt with worsening renal function - unable to diurese, would favor RHC to guide management Informed Consent   Shared Decision Making/Informed Consent The risks, including but not limited to, [bleeding or vascular complications (1 in 500), pneumothorax (1 in 1600), arrhythmia (1 in 1000) and death (1 in 5000)], benefits (diagnostic support and/or management of heart failure, pulmonary hypertension) and alternatives of a  right heart catheterization were discussed in detail with Ms. Dabbs and she is willing to proceed.    Persistent Atrial Fibrillation - new onset 8/25 - rate controlled - continue eliquis  5 mg bid - continue amio gtt - may need DCCV after diuresed if remains in AF  Severe MR - not noted on last echo 5/23 - severe on echo today with degenerative valve   AKI - cardio-renal  - Cr now 1.62 > 2.27 with attempt to diurese - plan as above - daily BMET   CAD - LHC in 2015 showed 99% RCA occlusion with collaterals - hs-trop 35>35, not concerning for ACS - continue atorva 80 mg daily  Nausea - suspect related to CHF exac/poss low outputHF - prn phenergan  and zofran   Length of Stay: 2  [place] Lee, NP  06/19/2024, 10:05 AM  Advanced Heart Failure Team Pager [phone] (M-F; 7a - 5p)  Please contact [facility] for night-coverage after hours (5p -7a ) and weekends on [email or web address]  Agree with above  She is now on milrinone and lasix  gtt. Co-ox ox but not peeing much and developing AKI/   Feels weak, nauseated and SOB  General:  Sitting up in bed ill-appearing HEENT: normal Neck: supple. JVP to jaw   Cor: Regular Lungs: clear Abdomen: soft, obese ++ distended. No hepatosplenomegaly. No bruits or masses. Good bowel sounds. Extremities: no cyanosis, clubbing, rash,2-3+  edema Neuro: alert & orientedx3, cranial nerves grossly intact. moves all 4 extremities w/o difficulty. Affect pleasant  She has severe cardiorenal syndrome. Not responding to milrinone and high-dose diuretics. Will take to cath lab for RHC/swan placement +/- HD cath.   CRITICAL CARE Performed by: Cherrie Sieving  Total critical care time: 45 minutes  Critical care time was exclusive of separately billable procedures and treating other patients.  Critical care was necessary to treat or prevent imminent or life-threatening deterioration.  Critical care was time spent personally by me (independent of  midlevel providers or residents) on the following activities: development of treatment plan with patient and/or surrogate as well as nursing, discussions with consultants, evaluation of patient's response to treatment, examination of patient, obtaining history from patient or surrogate, ordering and performing treatments and interventions, ordering and review of laboratory studies, ordering and review of radiographic studies, pulse oximetry and re-evaluation of patient's condition.  Sieving Cherrie, MD  12:20 PM

## 2024-06-19 NOTE — Consult Note (Signed)
 Nephrology Consult   Requesting provider: [place] Lee, NP Service requesting consult: HF Reason for consult: CRRT   Assessment/Recommendations: [name] is a/an 73 y.o. female with a past medical history notable for AKI    Oliguric AKI (not improving): Likely secondary to new RV heart failure, persistent atrial fibrillation -Should not produce significant urine output despite high-dose diuretics.  RHC with concern for volume overload.  Given this, will start CRRT. -Chart reviewed: (medications acceptable, does not appear to have been exposed to nephrotoxins with imaging or had episodes of significant hypotension).   -Continue to monitor daily Cr, Dose meds for GFR<15 -Monitor Daily I/Os, Daily weight  -Maintain MAP>65 for optimal renal perfusion.  -Agree with holding ACE-I, avoid further nephrotoxins including NSAIDS, Morphine .  Unless absolutely necessary, avoid CT with contrast and/or MRI with gadolinium.      Volume Status: Appears volume overloaded on exam. Based on our examination and review of available imaging, our recommendation is CRRT as above.  Hypertension:  Electrolytes  Hyponatremia: Mild, new.  Likely due to volume overload state of heart failure.  Should improve with CRRT  Metabolic acidosis: Likely in the setting of worsening renal function.  Should improve on CRRT.  AoC BiV HF; New onset RV failure: On milrinone, for output with high-dose diuretics.  CRRT as above.  Atrial fibrillation: On amiodarone drip  CAD: On atorvastatin    Recommendations conveyed to primary service.    [name] 06/19/2024 3:00 PM   _____________________________________________________________________________________   History of Present Illness: [name] is a/an 73 y.o. female with a past medical history of, on systolic & diastolic heart failure s/p BiV ICD, CAD, OSA on BiPAP, LBBB, HTN, new Afib who presents to with Chest pain, SOB,  cough.  Started on lasix  drip + metolazone 10 and made 765 mL urine.  Creatinine worsening: 1.59 on admit to 2.27 now.  Baseline appears 1.2-1.5.  RHC revealing pulm wedge 40.  Seen in 2H.  Somewhat groggy postprocedure.  On 3LNC.  On amiodarone, Lasix , milrinone.     Medications:  Current Facility-Administered Medications  Medication Dose Route Frequency Provider Last Rate Last Admin   [MAR Hold] acetaminophen  (TYLENOL ) tablet 650 mg  650 mg Oral Q6H PRN Patel, Vishal R, MD       Or   ILDA Hold] acetaminophen  (TYLENOL ) suppository 650 mg  650 mg Rectal Q6H PRN Patel, Vishal R, MD       [MAR Hold] albuterol  (PROVENTIL ) (2.5 MG/3ML) 0.083% nebulizer solution 2.5 mg  2.5 mg Nebulization Q6H PRN Patel, Vishal R, MD   2.5 mg at 06/18/24 0143   amiodarone (NEXTERONE PREMIX) 360-4.14 MG/200ML-% (1.8 mg/mL) IV infusion  30 mg/hr Intravenous Continuous Lee, [place], NP 16.67 mL/hr at 06/19/24 1200 30 mg/hr at 06/19/24 1200   [MAR Hold] apixaban  (ELIQUIS ) tablet 5 mg  5 mg Oral BID Patel, Vishal R, MD   5 mg at 06/19/24 1001   [MAR Hold] atorvastatin  (LIPITOR ) tablet 80 mg  80 mg Oral Daily Patel, Vishal R, MD   80 mg at 06/19/24 1001   [MAR Hold] bisacodyl (DULCOLAX) EC tablet 5 mg  5 mg Oral Daily PRN Patel, Vishal R, MD       [MAR Hold] cefTRIAXone  (ROCEPHIN ) 2 g in sodium chloride  0.9 % 100 mL IVPB  2 g Intravenous Q24H Simpson, Paula B, NP   Stopped at 06/18/24 2206   [MAR Hold] Chlorhexidine  Gluconate Cloth 2 % PADS 6 each  6 each  Topical Daily Lee, [place], NP   6 each at 06/19/24 1032   furosemide  (LASIX ) 200 mg in dextrose  5 % 100 mL (2 mg/mL) infusion  20 mg/hr Intravenous Continuous Lee, [place], NP       Heparin  (Porcine) in NaCl 1000-0.9 UT/500ML-% SOLN    PRN Bensimhon, Daniel R, MD   500 mL at 06/19/24 1420   [MAR Hold] insulin  aspart (novoLOG ) injection 0-15 Units  0-15 Units Subcutaneous TID WC Lee, Jordan, NP   5 Units at 06/19/24 1247   [MAR Hold] insulin  aspart (novoLOG ) injection 0-5  Units  0-5 Units Subcutaneous QHS Patel, Vishal R, MD       [facility] Hold] levothyroxine  (SYNTHROID ) tablet 50 mcg  50 mcg Oral Q0600 Patel, Vishal R, MD   50 mcg at 06/19/24 9392   lidocaine  (PF) (XYLOCAINE ) 1 % injection    PRN Bensimhon, Daniel R, MD   5 mL at 06/19/24 1436   milrinone (PRIMACOR) 20 MG/100 ML (0.2 mg/mL) infusion  0.375 mcg/kg/min Intravenous Continuous Lee, [place], NP 10.53 mL/hr at 06/19/24 1200 0.375 mcg/kg/min at 06/19/24 1200   [MAR Hold] ondansetron  (ZOFRAN ) tablet 4 mg  4 mg Oral Q6H PRN Patel, Vishal R, MD       Or   ILDA Hold] ondansetron  (ZOFRAN ) injection 4 mg  4 mg Intravenous Q6H PRN Patel, Vishal R, MD   4 mg at 06/19/24 1002   [MAR Hold] Oral care mouth rinse  15 mL Mouth Rinse PRN Bensimhon, Toribio SAUNDERS, MD       [facility] Hold] promethazine  (PHENERGAN ) 12.5 mg in sodium chloride  0.9 % 50 mL IVPB  12.5 mg Intravenous Q6H PRN Lee, [place], NP   Stopped at 06/19/24 1121   [MAR Hold] senna-docusate (Senokot-S) tablet 1 tablet  1 tablet Oral QHS PRN Patel, Vishal R, MD       [MAR Hold] sodium chloride  flush (NS) 0.9 % injection 10-40 mL  10-40 mL Intracatheter Q12H Bensimhon, Toribio SAUNDERS, MD   10 mL at 06/19/24 1033   [MAR Hold] sodium chloride  flush (NS) 0.9 % injection 10-40 mL  10-40 mL Intracatheter PRN Bensimhon, Toribio SAUNDERS, MD       [facility] Hold] sodium chloride  flush (NS) 0.9 % injection 3 mL  3 mL Intravenous Q12H Tobie Bloch R, MD   3 mL at 06/19/24 1033     ALLERGIES Other, Fentanyl , and Lisinopril   MEDICAL HISTORY Past Medical History:  Diagnosis Date   A-fib (HCC) 06/12/2024   Anemia    Arthritis    CAD (coronary artery disease)    a. LHC (03/2014) [place]: nl, LAD: diff dz proximal 20%, 30-40% dz mid vessel prior 2nd diagonal, LCx: 40% in OM1, 20-30% in OM2, RCA: 99% subtotal occlusion @ crux, TIMI 1 flow, L-R collaterals to distal vessel   Cardiomyopathy (HCC) 03/28/2014   LV dysfunction out of proportion to CAD 03/28/14   CHF (congestive heart failure) (HCC)     Phreesia 10/03/2020   Chronic systolic heart failure (HCC)    a. ECHO (03/2014): EF 25-30%, akinesis enteroanteroseptal myocardium, grade III DD b. RHC (03/2014) RA 4, RV 42/4, PA 44/14 (26), PCWP 21, PA 62% Fick CO/CI 6.9 / 3.4   Diabetes mellitus    Diabetes mellitus without complication (HCC)    Phreesia 10/03/2020   Duodenal ulcer    Age 69   Dyspnea    Erosive esophagitis    Gastritis    Hypertension    Hypothyroidism    Obese  Short-term memory loss    Thyroid  disease    TTP (thrombotic thrombocytopenic purpura) (HCC) 06/2005     SOCIAL HISTORY Social History   Socioeconomic History   Marital status: Single    Spouse name: Not on file   Number of children: 3   Years of education: 12   Highest education level: Some college, no degree  Occupational History   Occupation: disabled    Associate Professor: NOT EMPLOYED  Tobacco Use   Smoking status: Former    Current packs/day: 0.00    Average packs/day: 0.3 packs/day for 12.0 years (3.0 ttl pk-yrs)    Types: Cigarettes    Start date: 09/20/1966    Quit date: 09/20/1978    Years since quitting: 45.7   Smokeless tobacco: Never   Tobacco comments:    Former smoker 05/15/24  Vaping Use   Vaping status: Never Used  Substance and Sexual Activity   Alcohol use: No    Alcohol/week: 0.0 standard drinks of alcohol   Drug use: No   Sexual activity: Not Currently  Other Topics Concern   Not on file  Social History Narrative   Lives Alone       Social Drivers of Health   Financial Resource Strain: Low Risk  (03/21/2024)   Overall Financial Resource Strain (CARDIA)    Difficulty of Paying Living Expenses: Not hard at all  Food Insecurity: No Food Insecurity (06/18/2024)   Hunger Vital Sign    Worried About Running Out of Food in the Last Year: Never true    Ran Out of Food in the Last Year: Never true  Recent Concern: Food Insecurity - Food Insecurity Present (04/23/2024)   Hunger Vital Sign    Worried About Running Out of Food in  the Last Year: Never true    Ran Out of Food in the Last Year: Sometimes true  Transportation Needs: No Transportation Needs (06/18/2024)   PRAPARE - Administrator, Civil Service (Medical): No    Lack of Transportation (Non-Medical): No  Physical Activity: Sufficiently Active (03/21/2024)   Exercise Vital Sign    Days of Exercise per Week: 7 days    Minutes of Exercise per Session: 30 min  Stress: No Stress Concern Present (03/21/2024)   [employer] of Occupational Health - Occupational Stress Questionnaire    Feeling of Stress: Not at all  Social Connections: Moderately Integrated (06/18/2024)   Social Connection and Isolation Panel    Frequency of Communication with Friends and Family: More than three times a week    Frequency of Social Gatherings with Friends and Family: Twice a week    Attends Religious Services: More than 4 times per year    Active Member of [employer] or Organizations: Yes    Attends Engineer, structural: More than 4 times per year    Marital Status: Divorced  Intimate Partner Violence: Not At Risk (06/18/2024)   Humiliation, Afraid, Rape, and Kick questionnaire    Fear of Current or Ex-Partner: No    Emotionally Abused: No    Physically Abused: No    Sexually Abused: No     FAMILY HISTORY Family History  Problem Relation Age of Onset   Heart failure Mother    Cirrhosis Father        ETOH   Heart disease Brother    Diabetes Daughter    Breast cancer Maternal Aunt    Colon cancer Neg Hx       Review of Systems: 76  systems reviewed Otherwise as per HPI, all other systems reviewed and negative  Physical Exam: Vitals:   06/19/24 1442 06/19/24 1447  BP: 101/64 112/70  Pulse: (!) 112 (!) 117  Resp: 20 (!) 22  Temp:    SpO2: 98% 94%   Total I/O In: 206.9 [I.V.:154.3; IV Piggyback:52.6] Out: 47 [Urine:47]  Intake/Output Summary (Last 24 hours) at 06/19/2024 1500 Last data filed at 06/19/2024 1200 Gross per 24 hour  Intake  974.76 ml  Output 812 ml  Net 162.76 ml   General: well-appearing, no acute distress, 3 LNC CV: Tachycardic, 1-2+ peripheral edema Lungs: Bilateral breath, normal work of breathing Abd: soft, non-tender, non-distended Skin: no visible lesions or rashes Psych: Postprocedure effects/grogginess Musculoskeletal: no obvious deformities  Test Results Reviewed Lab Results  Component Value Date   NA 133 (L) 06/19/2024   K 4.5 06/19/2024   CL 103 06/19/2024   CO2 18 (L) 06/19/2024   BUN 47 (H) 06/19/2024   CREATININE 2.27 (H) 06/19/2024   CALCIUM  8.5 (L) 06/19/2024   ALBUMIN 3.0 (L) 06/17/2024     I have reviewed all relevant outside healthcare records related to the patient's kidney injury.

## 2024-06-19 NOTE — Progress Notes (Addendum)
 Advanced Heart Failure Rounding Note  Cardiologist: None  Chief Complaint: CHF Subjective:    Co-ox 72.4 on milrinone 0.25 mcg/kg/min, CVP 15 Made 765cc on Lasix  gtt 20/hr + metolazone 10 mg sCr 1.62 >2.27  Feeling poorly this morning. She has been nauseated and fatigued throughout the night. Tired this morning.   Objective:    Weight Range: 97.8 kg Body mass index is 35.88 kg/m.   Vital Signs:   Temp:  [97.3 F (36.3 C)-97.7 F (36.5 C)] 97.3 F (36.3 C) (09/30 0730) Pulse Rate:  [68-107] 92 (09/30 0700) Resp:  [11-33] 17 (09/30 0700) BP: (83-146)/(54-125) 110/70 (09/30 0700) SpO2:  [82 %-100 %] 98 % (09/30 0700) Weight:  [97.8 kg] 97.8 kg (09/30 0500) Last BM Date : 06/18/24  Weight change: Filed Weights   06/19/24 0500  Weight: 97.8 kg   Intake/Output:  Intake/Output Summary (Last 24 hours) at 06/19/2024 1005 Last data filed at 06/19/2024 0700 Gross per 24 hour  Intake 928.19 ml  Output 765 ml  Net 163.19 ml    Physical Exam    General: Chronically ill appearing Cardiac: JVP ~15. S1 and S2 present. No murmurs. Abdomen: Soft, non-tender, distended.  Extremities: cool, dry.  2-3+ peripheral edema.  Neuro: Drowsy, affect flat   Labs    CBC Recent Labs    06/18/24 0630 06/19/24 0530  WBC 4.5 4.9  HGB 12.1 10.7*  HCT 35.3* 32.1*  MCV 82.1 84.0  PLT 90* 102*   Basic Metabolic Panel Recent Labs    90/70/74 0630 06/19/24 0530  NA 137 133*  K 4.9 4.5  CL 107 103  CO2 17* 18*  GLUCOSE 155* 193*  BUN 45* 47*  CREATININE 1.62* 2.27*  CALCIUM  8.6* 8.5*  MG 2.0 2.0   Liver Function Tests Recent Labs    06/17/24 1813  AST 14*  ALT 12  ALKPHOS 41  BILITOT 1.1  PROT 6.8  ALBUMIN 3.0*   BNP (last 3 results) Recent Labs    06/17/24 1813  BNP 1,998.5*   Medications:    Scheduled Medications:  apixaban   5 mg Oral BID   atorvastatin   80 mg Oral Daily   Chlorhexidine  Gluconate Cloth  6 each Topical Daily   insulin  aspart  0-5  Units Subcutaneous QHS   insulin  aspart  0-9 Units Subcutaneous TID WC   levothyroxine   50 mcg Oral Q0600   sodium chloride  flush  10-40 mL Intracatheter Q12H   sodium chloride  flush  3 mL Intravenous Q12H    Infusions:  amiodarone 30 mg/hr (06/19/24 0700)   cefTRIAXone  (ROCEPHIN )  IV Stopped (06/18/24 2206)   furosemide  (LASIX ) 200 mg in dextrose  5 % 100 mL (2 mg/mL) infusion 20 mg/hr (06/19/24 0700)   milrinone 0.25 mcg/kg/min (06/19/24 0725)    PRN Medications: acetaminophen  **OR** acetaminophen , albuterol , bisacodyl, ondansetron  **OR** ondansetron  (ZOFRAN ) IV, mouth rinse, senna-docusate, sodium chloride  flush  Patient Profile   [name] is a 73 y.o.  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   Assessment/Plan   Acute on Chronic Biventricular Heart Failure  New onset RV Failure - mixed ischemic, nonischemic, EF initially down to 25-30% in 2015, LV dysfunction felt to be out of proportion to CAD. EF has been stable in 30-35% - LA normal in ED, diuresis has been limited by hypotension and worsening renal function concerning for low ouput - would favor getting  back to NSR, suspect that decompensation related to atrial fibrillation - repeat echo with EF 20-25%, mod LVH, D-shaped septum, RV mod reduced, degenerative MR severe MR - co-ox 72%, CVP 15 - increase milrinone to 0.375 mcg/kg/min - poor urine response to Lasix  gtt + metolazone; will stop Lasix  gtt with worsening renal function - unable to diurese, would favor RHC to guide management Informed Consent   Shared Decision Making/Informed Consent The risks, including but not limited to, [bleeding or vascular complications (1 in 500), pneumothorax (1 in 1600), arrhythmia (1 in 1000) and death (1 in 5000)], benefits (diagnostic support and/or management of heart failure, pulmonary hypertension) and alternatives of a  right heart catheterization were discussed in detail with Ms. Dabbs and she is willing to proceed.    Persistent Atrial Fibrillation - new onset 8/25 - rate controlled - continue eliquis  5 mg bid - continue amio gtt - may need DCCV after diuresed if remains in AF  Severe MR - not noted on last echo 5/23 - severe on echo today with degenerative valve   AKI - cardio-renal  - Cr now 1.62 > 2.27 with attempt to diurese - plan as above - daily BMET   CAD - LHC in 2015 showed 99% RCA occlusion with collaterals - hs-trop 35>35, not concerning for ACS - continue atorva 80 mg daily  Nausea - suspect related to CHF exac/poss low outputHF - prn phenergan  and zofran   Length of Stay: 2  [place] Lee, NP  06/19/2024, 10:05 AM  Advanced Heart Failure Team Pager [phone] (M-F; 7a - 5p)  Please contact [facility] for night-coverage after hours (5p -7a ) and weekends on [email or web address]  Agree with above  She is now on milrinone and lasix  gtt. Co-ox ox but not peeing much and developing AKI/   Feels weak, nauseated and SOB  General:  Sitting up in bed ill-appearing HEENT: normal Neck: supple. JVP to jaw   Cor: Regular Lungs: clear Abdomen: soft, obese ++ distended. No hepatosplenomegaly. No bruits or masses. Good bowel sounds. Extremities: no cyanosis, clubbing, rash,2-3+  edema Neuro: alert & orientedx3, cranial nerves grossly intact. moves all 4 extremities w/o difficulty. Affect pleasant  She has severe cardiorenal syndrome. Not responding to milrinone and high-dose diuretics. Will take to cath lab for RHC/swan placement +/- HD cath.   CRITICAL CARE Performed by: Cherrie Sieving  Total critical care time: 45 minutes  Critical care time was exclusive of separately billable procedures and treating other patients.  Critical care was necessary to treat or prevent imminent or life-threatening deterioration.  Critical care was time spent personally by me (independent of  midlevel providers or residents) on the following activities: development of treatment plan with patient and/or surrogate as well as nursing, discussions with consultants, evaluation of patient's response to treatment, examination of patient, obtaining history from patient or surrogate, ordering and performing treatments and interventions, ordering and review of laboratory studies, ordering and review of radiographic studies, pulse oximetry and re-evaluation of patient's condition.  Sieving Cherrie, MD  12:20 PM

## 2024-06-19 NOTE — Interval H&P Note (Signed)
 History and Physical Interval Note:  06/19/2024 1:59 PM  [name]  has presented today for surgery, with the diagnosis of HF.  The various methods of treatment have been discussed with the patient and family. After consideration of risks, benefits and other options for treatment, the patient has consented to  Procedure(s): RIGHT HEART CATH (N/A) as a surgical intervention.  The patient's history has been reviewed, patient examined, no change in status, stable for surgery.  I have reviewed the patient's chart and labs.  Questions were answered to the patient's satisfaction.     Winston Misner

## 2024-06-19 NOTE — Plan of Care (Signed)
  Problem: Coping: Goal: Ability to adjust to condition or change in health will improve Outcome: Progressing   Problem: Fluid Volume: Goal: Ability to maintain a balanced intake and output will improve Outcome: Progressing   Problem: Tissue Perfusion: Goal: Adequacy of tissue perfusion will improve Outcome: Progressing   Problem: Cardiac: Goal: Ability to achieve and maintain adequate cardiopulmonary perfusion will improve Outcome: Progressing

## 2024-06-19 NOTE — TOC Initial Note (Signed)
 Transition of Care [facility]) - Initial/Assessment Note    Patient Details  Name: [name] MRN: [id number] Date of Birth: June 22, 1951  [facility] of [facility]) CM/SW Contact:    Justina Delcia Czar, RN Phone Number: [phone] 06/19/2024, 1:02 PM  Clinical Narrative:                 Spoke to pt and son/DIL at bedside. Patient was lethargic and unable provided needed information. DIL states pt lives alone and has Rollator and cane at home. Son takes pt to appts. Has an in home aide for 12 hours per week. Provided medicare.gov list with ratings to son/DIL and placed on chart. Requested [facility] SNF rehab. Will need PT/OT evaluation and recommendations.  Gave permission to create FL2 and fax referral for SNF rehab.   Inpatient CM/CSW will continue to follow for dc needs.   Expected Discharge Plan: Skilled Nursing Facility Barriers to Discharge: Continued Medical Work up   Patient Goals and CMS Choice   CMS Medicare.gov Compare Post Acute Care list provided to:: Patient Represenative (must comment) Choice offered to / list presented to : Adult Children [place] ownership interest in [facility].provided to:: Adult Children    Expected Discharge Plan and Services   Discharge Planning Services: CM Consult Post Acute Care Choice: Skilled Nursing Facility Living arrangements for the past 2 months: Single Family Home                                      Prior Living Arrangements/Services Living arrangements for the past 2 months: Single Family Home Lives with:: Self Patient language and need for interpreter reviewed:: Yes Do you feel safe going back to the place where you live?: Yes      Need for Family Participation in Patient Care: Yes (Comment) Care giver support system in place?: Yes (comment) Current home services: DME (Rollator, cane) Criminal Activity/Legal Involvement Pertinent to Current Situation/Hospitalization: No - Comment as  needed  Activities of Daily Living      Permission Sought/Granted Permission sought to share information with : Case Manager, Family Supports, PCP Permission granted to share information with : Yes, Verbal Permission Granted  Share Information with NAME: Sathvika Ojo  Permission granted to share info w AGENCY: SNF, PCP, DME  Permission granted to share info w Relationship: son  Permission granted to share info w Contact Information: [phone]  Emotional Assessment Appearance:: Appears stated age Attitude/Demeanor/Rapport: Lethargic          Admission diagnosis:  Hypoxia [R09.02] Hypotension [I95.9] Chest pain, unspecified type [R07.9] Pneumonia due to infectious organism, unspecified laterality, unspecified part of lung [J18.9] Acute on chronic congestive heart failure, unspecified heart failure type (HCC) [I50.9] Acute on chronic heart failure with mildly reduced ejection fraction (HFmrEF, 41-49%) (HCC) [I50.23] Patient Active Problem List   Diagnosis Date Noted   Hypotension 06/18/2024   AKI (acute kidney injury) 06/18/2024   Acute on chronic congestive heart failure (HCC) 06/18/2024   Acute on chronic heart failure with mildly reduced ejection fraction (HFmrEF, 41-49%) (HCC) 06/17/2024   Persistent atrial fibrillation (HCC) 06/12/2024   Acute on chronic combined systolic and diastolic congestive heart failure (HCC) 12/13/2023   Hospital discharge follow-up 12/13/2023   Dyspnea 12/13/2023   Influenza 11/17/2023   Type 2 diabetes mellitus with hyperglycemia, with long-term current use of insulin  (HCC) 08/04/2023   Acute mucoid otitis media of  both ears 08/04/2023   Hyperlipidemia associated with type 2 diabetes mellitus (HCC) 06/17/2023   URTI (acute upper respiratory infection) 09/27/2022   Gait abnormality 09/27/2022   OSA (obstructive sleep apnea) 05/01/2020   Obesity 05/01/2020   Hypothyroidism 05/01/2020   Cardiomyopathy, ischemic 04/04/2014   Hypertension  associated with diabetes (HCC) 04/04/2014   Chronic combined systolic and diastolic CHF (congestive heart failure) (HCC) 04/04/2014   Coronary artery disease 03/31/2014   Type 2 diabetes mellitus with diabetic neuropathy (HCC) 03/31/2014   DCM (dilated cardiomyopathy) (HCC) 03/29/2014   Anemia 03/26/2014   Cardiogenic shock (HCC) 03/23/2014   Consolidation of right lower lobe of lung 03/23/2014   Gastroparesis 02/14/2014   Chronic constipation 11/30/2012   GERD (gastroesophageal reflux disease) 11/30/2012   Fatty liver 11/30/2012   PCP:  Tobie Suzzane POUR, MD Pharmacy:   [facility] - [place], [place] - [address] [place] [address] Phone: [phone] Fax: [phone]  [facility] - [place], [place] - [address] [place] [address] Phone: [phone] Fax: [phone]     Social Drivers of Health (SDOH) Social History: SDOH Screenings   Food Insecurity: No Food Insecurity (06/18/2024)  Recent Concern: Food Insecurity - Food Insecurity Present (04/23/2024)  Housing: Low Risk  (06/18/2024)  Transportation Needs: No Transportation Needs (06/18/2024)  Utilities: Not At Risk (06/18/2024)  Alcohol Screen: Low Risk  (03/21/2024)  Depression (PHQ2-9): Low Risk  (04/23/2024)  Financial Resource Strain: Low Risk  (03/21/2024)  Physical Activity: Sufficiently Active (03/21/2024)  Social Connections: Moderately Integrated (06/18/2024)  Stress: No Stress Concern Present (03/21/2024)  Tobacco Use: Medium Risk (06/17/2024)  Health Literacy: Adequate Health Literacy (03/21/2024)   SDOH Interventions:     Readmission Risk Interventions     No data to display

## 2024-06-20 DIAGNOSIS — I4819 Other persistent atrial fibrillation: Secondary | ICD-10-CM | POA: Diagnosis not present

## 2024-06-20 DIAGNOSIS — I5082 Biventricular heart failure: Secondary | ICD-10-CM | POA: Diagnosis not present

## 2024-06-20 DIAGNOSIS — N179 Acute kidney failure, unspecified: Secondary | ICD-10-CM | POA: Diagnosis not present

## 2024-06-20 LAB — RENAL FUNCTION PANEL
Albumin: 3 g/dL — ABNORMAL LOW (ref 3.5–5.0)
Albumin: 2.8 g/dL — ABNORMAL LOW (ref 3.5–5.0)
Anion gap: 11 (ref 5–15)
Anion gap: 8 (ref 5–15)
BUN: 31 mg/dL — ABNORMAL HIGH (ref 8–23)
BUN: 19 mg/dL (ref 8–23)
CO2: 20 mmol/L — ABNORMAL LOW (ref 22–32)
CO2: 23 mmol/L (ref 22–32)
Calcium: 8.1 mg/dL — ABNORMAL LOW (ref 8.9–10.3)
Calcium: 7.9 mg/dL — ABNORMAL LOW (ref 8.9–10.3)
Chloride: 101 mmol/L (ref 98–111)
Chloride: 101 mmol/L (ref 98–111)
Creatinine, Ser: 1.82 mg/dL — ABNORMAL HIGH (ref 0.44–1.00)
Creatinine, Ser: 1.65 mg/dL — ABNORMAL HIGH (ref 0.44–1.00)
GFR, Estimated: 29 mL/min — ABNORMAL LOW (ref >60)
GFR, Estimated: 32 mL/min — ABNORMAL LOW (ref >60)
Glucose, Bld: 157 mg/dL — ABNORMAL HIGH (ref 70–99)
Glucose, Bld: 146 mg/dL — ABNORMAL HIGH (ref 70–99)
Phosphorus: 3.1 mg/dL (ref 2.5–4.6)
Phosphorus: 2.3 mg/dL — ABNORMAL LOW (ref 2.5–4.6)
Potassium: 4.4 mmol/L (ref 3.5–5.1)
Potassium: 4.1 mmol/L (ref 3.5–5.1)
Sodium: 132 mmol/L — ABNORMAL LOW (ref 135–145)
Sodium: 132 mmol/L — ABNORMAL LOW (ref 135–145)

## 2024-06-20 LAB — POCT I-STAT EG7
Acid-base deficit: 6 mmol/L — ABNORMAL HIGH (ref 0.0–2.0)
Acid-base deficit: 6 mmol/L — ABNORMAL HIGH (ref 0.0–2.0)
Bicarbonate: 19.8 mmol/L — ABNORMAL LOW (ref 20.0–28.0)
Bicarbonate: 20 mmol/L (ref 20.0–28.0)
Calcium, Ion: 1.18 mmol/L (ref 1.15–1.40)
Calcium, Ion: 1.18 mmol/L (ref 1.15–1.40)
HCT: 35 % — ABNORMAL LOW (ref 36.0–46.0)
HCT: 35 % — ABNORMAL LOW (ref 36.0–46.0)
Hemoglobin: 11.9 g/dL — ABNORMAL LOW (ref 12.0–15.0)
Hemoglobin: 11.9 g/dL — ABNORMAL LOW (ref 12.0–15.0)
O2 Saturation: 57 %
O2 Saturation: 63 %
Potassium: 4.5 mmol/L (ref 3.5–5.1)
Potassium: 4.5 mmol/L (ref 3.5–5.1)
Sodium: 136 mmol/L (ref 135–145)
Sodium: 136 mmol/L (ref 135–145)
TCO2: 21 mmol/L — ABNORMAL LOW (ref 22–32)
TCO2: 21 mmol/L — ABNORMAL LOW (ref 22–32)
pCO2, Ven: 39.5 mmHg — ABNORMAL LOW (ref 44–60)
pCO2, Ven: 39.5 mmHg — ABNORMAL LOW (ref 44–60)
pH, Ven: 7.307 (ref 7.25–7.43)
pH, Ven: 7.312 (ref 7.25–7.43)
pO2, Ven: 32 mmHg (ref 32–45)
pO2, Ven: 36 mmHg (ref 32–45)

## 2024-06-20 LAB — APTT
aPTT: 109 s — ABNORMAL HIGH (ref 24–36)
aPTT: 100 s — ABNORMAL HIGH (ref 24–36)

## 2024-06-20 LAB — CBC
HCT: 29.6 % — ABNORMAL LOW (ref 36.0–46.0)
Hemoglobin: 10 g/dL — ABNORMAL LOW (ref 12.0–15.0)
MCH: 27.5 pg (ref 26.0–34.0)
MCHC: 33.8 g/dL (ref 30.0–36.0)
MCV: 81.5 fL (ref 80.0–100.0)
Platelets: 97 K/uL — ABNORMAL LOW (ref 150–400)
RBC: 3.63 MIL/uL — ABNORMAL LOW (ref 3.87–5.11)
RDW: 19.1 % — ABNORMAL HIGH (ref 11.5–15.5)
WBC: 7.2 K/uL (ref 4.0–10.5)
nRBC: 0.3 % — ABNORMAL HIGH (ref 0.0–0.2)

## 2024-06-20 LAB — GLUCOSE, CAPILLARY
Glucose-Capillary: 166 mg/dL — ABNORMAL HIGH (ref 70–99)
Glucose-Capillary: 170 mg/dL — ABNORMAL HIGH (ref 70–99)
Glucose-Capillary: 160 mg/dL — ABNORMAL HIGH (ref 70–99)
Glucose-Capillary: 147 mg/dL — ABNORMAL HIGH (ref 70–99)
Glucose-Capillary: 168 mg/dL — ABNORMAL HIGH (ref 70–99)

## 2024-06-20 LAB — COOXEMETRY PANEL
Carboxyhemoglobin: 1.9 % — ABNORMAL HIGH (ref 0.5–1.5)
Methemoglobin: 0.9 % (ref 0.0–1.5)
O2 Saturation: 79.3 %
Total hemoglobin: 10.6 g/dL — ABNORMAL LOW (ref 12.0–16.0)

## 2024-06-20 LAB — HEPARIN LEVEL (UNFRACTIONATED): Heparin Unfractionated: >1.1 [IU]/mL — ABNORMAL HIGH (ref 0.30–0.70)

## 2024-06-20 LAB — MAGNESIUM: Magnesium: 2 mg/dL (ref 1.7–2.4)

## 2024-06-20 MED ORDER — VASOPRESSIN 20 UNITS/100 ML INFUSION FOR SHOCK
0.0300 [IU]/min | INTRAVENOUS | Status: DC
  Administered 2024-06-20 – 2024-06-22 (×4): 0.03 [IU]/min via INTRAVENOUS
  Administered 2024-06-22: 0.02 [IU]/min via INTRAVENOUS
  Administered 2024-06-23 – 2024-06-29 (×14): 0.03 [IU]/min via INTRAVENOUS
  Filled 2024-06-20 (×18): qty 100

## 2024-06-20 MED ORDER — RENA-VITE PO TABS
1.0000 | ORAL_TABLET | Freq: Every day | ORAL | Status: DC
  Administered 2024-06-21 – 2024-07-15 (×24): 1 via ORAL
  Filled 2024-06-20 (×23): qty 1

## 2024-06-20 MED ORDER — RENA-VITE PO TABS
1.0000 | ORAL_TABLET | Freq: Every day | ORAL | Status: DC
  Administered 2024-06-20: 1
  Filled 2024-06-20: qty 1

## 2024-06-20 MED ORDER — SCOPOLAMINE 1 MG/3DAYS TD PT72
1.0000 | MEDICATED_PATCH | TRANSDERMAL | Status: DC
  Administered 2024-06-20 – 2024-07-11 (×8): 1 mg via TRANSDERMAL
  Filled 2024-06-20 (×9): qty 1

## 2024-06-20 NOTE — Plan of Care (Signed)
  Problem: Education: Goal: Ability to describe self-care measures that may prevent or decrease complications (Diabetes Survival Skills Education) will improve Outcome: Progressing Goal: Individualized Educational Video(s) Outcome: Progressing   Problem: Coping: Goal: Ability to adjust to condition or change in health will improve Outcome: Progressing   Problem: Fluid Volume: Goal: Ability to maintain a balanced intake and output will improve Outcome: Progressing   Problem: Health Behavior/Discharge Planning: Goal: Ability to identify and utilize available resources and services will improve Outcome: Progressing Goal: Ability to manage health-related needs will improve Outcome: Progressing   Problem: Metabolic: Goal: Ability to maintain appropriate glucose levels will improve Outcome: Progressing   Problem: Nutritional: Goal: Maintenance of adequate nutrition will improve Outcome: Progressing Goal: Progress toward achieving an optimal weight will improve Outcome: Progressing   Problem: Skin Integrity: Goal: Risk for impaired skin integrity will decrease Outcome: Progressing   Problem: Tissue Perfusion: Goal: Adequacy of tissue perfusion will improve Outcome: Progressing   Problem: Education: Goal: Ability to demonstrate management of disease process will improve Outcome: Progressing Goal: Ability to verbalize understanding of medication therapies will improve Outcome: Progressing Goal: Individualized Educational Video(s) Outcome: Progressing   Problem: Activity: Goal: Capacity to carry out activities will improve Outcome: Progressing   Problem: Cardiac: Goal: Ability to achieve and maintain adequate cardiopulmonary perfusion will improve Outcome: Progressing   Problem: Education: Goal: Knowledge of General Education information will improve Description: Including pain rating scale, medication(s)/side effects and non-pharmacologic comfort measures Outcome:  Progressing   Problem: Health Behavior/Discharge Planning: Goal: Ability to manage health-related needs will improve Outcome: Progressing   Problem: Clinical Measurements: Goal: Ability to maintain clinical measurements within normal limits will improve Outcome: Progressing Goal: Will remain free from infection Outcome: Progressing Goal: Diagnostic test results will improve Outcome: Progressing Goal: Respiratory complications will improve Outcome: Progressing Goal: Cardiovascular complication will be avoided Outcome: Progressing   Problem: Activity: Goal: Risk for activity intolerance will decrease Outcome: Progressing   Problem: Nutrition: Goal: Adequate nutrition will be maintained Outcome: Progressing   Problem: Coping: Goal: Level of anxiety will decrease Outcome: Progressing   Problem: Elimination: Goal: Will not experience complications related to bowel motility Outcome: Progressing Goal: Will not experience complications related to urinary retention Outcome: Progressing   Problem: Pain Managment: Goal: General experience of comfort will improve and/or be controlled Outcome: Progressing   Problem: Safety: Goal: Ability to remain free from injury will improve Outcome: Progressing   Problem: Skin Integrity: Goal: Risk for impaired skin integrity will decrease Outcome: Progressing   Problem: Education: Goal: Understanding of CV disease, CV risk reduction, and recovery process will improve Outcome: Progressing Goal: Individualized Educational Video(s) Outcome: Progressing   Problem: Activity: Goal: Ability to return to baseline activity level will improve Outcome: Progressing   Problem: Cardiovascular: Goal: Ability to achieve and maintain adequate cardiovascular perfusion will improve Outcome: Progressing Goal: Vascular access site(s) Level 0-1 will be maintained Outcome: Progressing   Problem: Health Behavior/Discharge Planning: Goal: Ability to  safely manage health-related needs after discharge will improve Outcome: Progressing

## 2024-06-20 NOTE — Progress Notes (Signed)
 Nephrology Follow-Up Consult note   Assessment/Recommendations: [name] is a/an 73 y.o. female with a past medical history significant for systolic & diastolic heart failure s/p BiV ICD, CAD, OSA on BiPAP, LBBB, HTN, new Afib who presents to with Chest pain, SOB, cough.     Anuric AKI (not improving): Likely secondary to new RV heart failure, persistent atrial fibrillation -Should not produce significant urine output despite high-dose diuretics.  RHC with concern for volume overload.  On pressor support Continue CRRT: 9/30-now  -Chart reviewed: (medications acceptable, does not appear to have been exposed to nephrotoxins with imaging or had episodes of significant hypotension).   -Continue to monitor daily Cr, Dose meds for GFR<15 -Monitor Daily I/Os, Daily weight  -Maintain MAP>65 for optimal renal perfusion.  -Agree with holding ACE-I, avoid further nephrotoxins including NSAIDS, Morphine .  Unless absolutely necessary, avoid CT with contrast and/or MRI with gadolinium.        Volume Status: Appears volume overloaded on exam. Based on our examination and review of available imaging, our recommendation is CRRT as above.   Electrolytes  Hyponatremia: Mild, new, stable.  Likely due to volume overload state of heart failure.  Should improve with CRRT   Metabolic acidosis: Improving.  Likely in the setting of worsening renal function.  Should improve on CRRT.   AoC BiV HF; New onset RV failure: On milrinone, for output with high-dose diuretics.  CRRT as above.   Atrial fibrillation: On amiodarone drip   CAD: On atorvastatin    Recommendations conveyed to primary service.    [name] [facility] 06/20/2024 7:17 AM  ___________________________________________________________  CC: Chest pain  Interval History/Subjective: CRRT started yesterday.  Tachycardic.  On norepinephrine , milrinone, amiodarone. UOP 70 mL. UF 254ml/h->100ml/h.  CRRT: 2.3 L  removed. CVP 11.   Medications:  Current Facility-Administered Medications  Medication Dose Route Frequency Provider Last Rate Last Admin   0.9 %  sodium chloride  infusion  250 mL Intravenous PRN Lee, Jordan, NP       acetaminophen  (TYLENOL ) tablet 650 mg  650 mg Oral Q6H PRN Patel, Vishal R, MD       Or   acetaminophen  (TYLENOL ) suppository 650 mg  650 mg Rectal Q6H PRN Patel, Vishal R, MD       albuterol  (PROVENTIL ) (2.5 MG/3ML) 0.083% nebulizer solution 2.5 mg  2.5 mg Nebulization Q6H PRN Patel, Vishal R, MD   2.5 mg at 06/18/24 0143   amiodarone (NEXTERONE PREMIX) 360-4.14 MG/200ML-% (1.8 mg/mL) IV infusion  30 mg/hr Intravenous Continuous Lee, [place], NP 16.67 mL/hr at 06/20/24 0600 30 mg/hr at 06/20/24 0600   atorvastatin  (LIPITOR ) tablet 80 mg  80 mg Oral Daily Patel, Vishal R, MD   80 mg at 06/19/24 1001   bisacodyl (DULCOLAX) EC tablet 5 mg  5 mg Oral Daily PRN Patel, Vishal R, MD       cefTRIAXone  (ROCEPHIN ) 2 g in sodium chloride  0.9 % 100 mL IVPB  2 g Intravenous Q24H Antonetta Moccasin B, NP   Stopped at 06/19/24 2213   Chlorhexidine  Gluconate Cloth 2 % PADS 6 each  6 each Topical Daily Lee, [place], NP   6 each at 06/19/24 1032   heparin  ADULT infusion 100 units/mL (25000 units/250mL)  1,050 Units/hr Intravenous Continuous Dang, Thuy D, RPH 10.5 mL/hr at 06/20/24 0600 1,050 Units/hr at 06/20/24 0600   heparin  injection 1,000-6,000 Units  1,000-6,000 Units CRRT PRN Christina Christina HERO, DO   2,800 Units at 06/19/24 1711   insulin   aspart (novoLOG ) injection 0-15 Units  0-15 Units Subcutaneous TID WC Lee, Jordan, NP   3 Units at 06/19/24 1600   insulin  aspart (novoLOG ) injection 0-5 Units  0-5 Units Subcutaneous QHS Patel, Vishal R, MD       levothyroxine  (SYNTHROID ) tablet 50 mcg  50 mcg Oral Q0600 Patel, Vishal R, MD   50 mcg at 06/20/24 0512   milrinone (PRIMACOR) 20 MG/100 ML (0.2 mg/mL) infusion  0.375 mcg/kg/min Intravenous Continuous Lee, [place], NP 10.53 mL/hr at 06/20/24 0600 0.375  mcg/kg/min at 06/20/24 0600   norepinephrine  (LEVOPHED ) 4mg  in (0.016 mg/mL) premix infusion  0-10 mcg/min Intravenous Titrated Lee, [place], NP 33.8 mL/hr at 06/20/24 0600 9 mcg/min at 06/20/24 0600   ondansetron  (ZOFRAN ) tablet 4 mg  4 mg Oral Q6H PRN Patel, Vishal R, MD       Or   ondansetron  (ZOFRAN ) injection 4 mg  4 mg Intravenous Q6H PRN Patel, Vishal R, MD   4 mg at 06/19/24 1954   Oral care mouth rinse  15 mL Mouth Rinse PRN Bensimhon, Toribio SAUNDERS, MD       prismasol BGK 4/2.5 infusion   CRRT Continuous Christina Christina HERO, DO 400 mL/hr at 06/19/24 1614 New Bag at 06/19/24 1614   prismasol BGK 4/2.5 infusion   CRRT Continuous Christina Wiggs M, DO 400 mL/hr at 06/20/24 0544 New Bag at 06/20/24 0544   prismasol BGK 4/2.5 infusion   CRRT Continuous Christina Baranski M, DO 1,500 mL/hr at 06/20/24 0640 New Bag at 06/20/24 0640   promethazine  (PHENERGAN ) 12.5 mg in sodium chloride  0.9 % 50 mL IVPB  12.5 mg Intravenous Q6H PRN Lee, [place], NP   Stopped at 06/20/24 0431   senna-docusate (Senokot-S) tablet 1 tablet  1 tablet Oral QHS PRN Christina Jorie SAUNDERS, MD       sodium chloride  0.9 % primer fluid for CRRT   CRRT PRN Christina Mourer M, DO       sodium chloride  flush (NS) 0.9 % injection 10-40 mL  10-40 mL Intracatheter Q12H Bensimhon, Toribio SAUNDERS, MD   10 mL at 06/19/24 2150   sodium chloride  flush (NS) 0.9 % injection 10-40 mL  10-40 mL Intracatheter PRN Bensimhon, Toribio SAUNDERS, MD       sodium chloride  flush (NS) 0.9 % injection 3 mL  3 mL Intravenous Q12H Christina Jorie R, MD   3 mL at 06/19/24 2150   sodium chloride  flush (NS) 0.9 % injection 3 mL  3 mL Intravenous Q12H Lee, [place], NP   3 mL at 06/19/24 2150   sodium chloride  flush (NS) 0.9 % injection 3 mL  3 mL Intravenous PRN Lee, [place], NP          Review of Systems: 10 systems reviewed and negative except per interval history/subjective  Physical Exam: Vitals:   06/20/24 0700 06/20/24 0715  BP: 98/68   Pulse: (!) 120 (!) 111  Resp: (!)  23 (!) 24  Temp: 98.6 F (37 C) 98.6 F (37 C)  SpO2: 96% 94%   No intake/output data recorded.  Intake/Output Summary (Last 24 hours) at 06/20/2024 0717 Last data filed at 06/20/2024 0600 Gross per 24 hour  Intake 1204.86 ml  Output 2367.5 ml  Net -1162.64 ml   General: well-appearing, no acute distress, 3 LNC CV: Tachycardic, 1-2+ peripheral edema Lungs: Bilateral breath, normal work of breathing Abd: soft, non-tender, non-distended Skin: no visible lesions or rashes Psych: Alert Musculoskeletal: no obvious deformities   Test Results I personally  reviewed new and old clinical labs and radiology tests Lab Results  Component Value Date   NA 132 (L) 06/20/2024   K 4.4 06/20/2024   CL 101 06/20/2024   CO2 20 (L) 06/20/2024   BUN 31 (H) 06/20/2024   CREATININE 1.82 (H) 06/20/2024   CALCIUM  8.1 (L) 06/20/2024   ALBUMIN 3.0 (L) 06/20/2024   PHOS 3.1 06/20/2024    CBC Recent Labs  Lab 06/18/24 0630 06/19/24 0530 06/20/24 0428  WBC 4.5 4.9 7.2  HGB 12.1 10.7* 10.0*  HCT 35.3* 32.1* 29.6*  MCV 82.1 84.0 81.5  PLT 90* 102* 97*

## 2024-06-20 NOTE — Progress Notes (Signed)
 PHARMACY - ANTICOAGULATION CONSULT NOTE  Pharmacy Consult:  Heparin  Indication: atrial fibrillation  Allergies  Allergen Reactions   Other Other (See Comments)    FLAGYL , CIPRO , PROTONIX  taken at the same time caused patient to lose her breath and have trouble breathing, requiring hospital stay at [facility], 03/23/14-per patient.    Fentanyl  Nausea Only    Room was spinning prefers not to take it   Lisinopril  Cough    Patient Measurements: Weight: 98.8 kg (217 lb 13 oz) Heparin  dosing weight = 79 kg  Vital Signs: Temp: 98.6 F (37 C) (10/01 1800) BP: 91/64 (10/01 1800) Pulse Rate: 93 (10/01 1800)  Labs: Recent Labs    06/17/24 2005 06/18/24 0152 06/18/24 0630 06/19/24 0530 06/19/24 1420 06/19/24 1421 06/19/24 1915 06/20/24 0420 06/20/24 0428 06/20/24 0653 06/20/24 1645  HGB  --    < > 12.1 10.7* 11.9* 11.9*  --   --  10.0*  --   --   HCT  --    < > 35.3* 32.1* 35.0* 35.0*  --   --  29.6*  --   --   PLT  --   --  90* 102*  --   --   --   --  97*  --   --   APTT  --   --   --   --   --   --   --   --   --  109* 100*  HEPARINUNFRC  --   --   --   --   --   --   --   --   --  >1.10*  --   CREATININE  --   --  1.62* 2.27*  --   --  2.23* 1.82*  --   --  1.65*  TROPONINIHS 35*  --   --   --   --   --   --   --   --   --   --    < > = values in this interval not displayed.    Estimated Creatinine Clearance: 34.3 mL/min (A) (by C-G formula based on SCr of 1.65 mg/dL (H)).   Medical History: Past Medical History:  Diagnosis Date   A-fib (HCC) 06/12/2024   Anemia    Arthritis    CAD (coronary artery disease)    a. LHC (03/2014) Lmain: nl, LAD: diff dz proximal 20%, 30-40% dz mid vessel prior 2nd diagonal, LCx: 40% in OM1, 20-30% in OM2, RCA: 99% subtotal occlusion @ crux, TIMI 1 flow, L-R collaterals to distal vessel   Cardiomyopathy (HCC) 03/28/2014   LV dysfunction out of proportion to CAD 03/28/14   CHF (congestive heart failure) (HCC)    Phreesia 10/03/2020   Chronic  systolic heart failure (HCC)    a. ECHO (03/2014): EF 25-30%, akinesis enteroanteroseptal myocardium, grade III DD b. RHC (03/2014) RA 4, RV 42/4, PA 44/14 (26), PCWP 21, PA 62% Fick CO/CI 6.9 / 3.4   Diabetes mellitus    Diabetes mellitus without complication (HCC)    Phreesia 10/03/2020   Duodenal ulcer    Age 73   Dyspnea    Erosive esophagitis    Gastritis    Hypertension    Hypothyroidism    Obese    Short-term memory loss    Thyroid  disease    TTP (thrombotic thrombocytopenic purpura) (HCC) 06/2005     Assessment: 73 YOF with cardiogenic shock, now requiring CRRT.  Pharmacy consulted to transition from Eliquis   to IV heparin , last dose on 9/30 at 1001.  Will use aPTT to guide heparin  dosing until heparin  level and aPTT correlates.  Heparin  level came back elevated >1.1, aPTT also elevated at 109, on heparin  infusion at 1050 units/hr. Hgb 10, plt 97. No infusion issues. Coughed up some bloody sputum overnight and having oozing that has resolved at IV site. Confirmed drawn appropriately.   10/1 PM update: aPTT 100, high end of therapeutic. No worsening bleeding or infusion issues.   Goal of Therapy:  Heparin  level 0.3-0.7 units/ml aPTT 66-102 seconds Monitor platelets by anticoagulation protocol: Yes   Plan:  Continue IV heparin  at 900 units/hr Check aPTT/Heparin  Level with AM labs Daily heparin  level, aPTT and CBC  Thank you for allowing pharmacy to participate in this patient's care,  Larraine Brazier, PharmD Clinical Pharmacist 06/20/2024  6:24 PM **Pharmacist phone directory can now be found on [email or web address] ([email or web address]).  Listed under [facility].

## 2024-06-20 NOTE — Progress Notes (Signed)
   06/20/24 2339  BiPAP/CPAP/SIPAP  BiPAP/CPAP/SIPAP Pt Type Adult  BiPAP/CPAP/SIPAP Resmed  Mask Type Full face mask  Mask Size Small  EPAP 6 cmH2O  FiO2 (%) 32 %  Flow Rate 3 lpm  Patient Home Machine No  Patient Home Mask No  Patient Home Tubing No  Auto Titrate No  CPAP/SIPAP surface wiped down Yes  Device Plugged into RED Power Outlet Yes  BiPAP/CPAP /SiPAP Vitals  Pulse Rate 95  Resp 17  SpO2 95 %  Bilateral Breath Sounds Clear;Diminished

## 2024-06-20 NOTE — Progress Notes (Cosign Needed Addendum)
 Advanced Heart Failure Rounding Note  Cardiologist: None  Chief Complaint: CHF Subjective:    RHC 9/30: RA 17, PA 63/35 (46), PCW 40, Fick 5.7/2.8, TD 3.7/1.8, PVR 1.6, PAPi 1.6 + HDC placed  Coox 80% on Milrinone 0.375 + NE 9 Net -1.4L on CRRT (70cc UOP)  CVP 11, PCW 21 (v-waves 40)  Continues with nausea. Feeling somewhat better although still tired. Hemodynamics improving.   Objective:    Weight Range: 98.8 kg Body mass index is 36.25 kg/m.   Vital Signs:   Temp:  [97.3 F (36.3 C)-98.8 F (37.1 C)] 98.8 F (37.1 C) (10/01 0900) Pulse Rate:  [85-132] 101 (10/01 0900) Resp:  [0-30] 20 (10/01 0900) BP: (76-161)/(45-105) 97/51 (10/01 0900) SpO2:  [83 %-99 %] 97 % (10/01 0900) Weight:  [98.8 kg] 98.8 kg (10/01 0515) Last BM Date : 06/20/24  Weight change: Filed Weights   06/19/24 0500 06/20/24 0515  Weight: 97.8 kg 98.8 kg   Intake/Output:  Intake/Output Summary (Last 24 hours) at 06/20/2024 1022 Last data filed at 06/20/2024 1000 Gross per 24 hour  Intake 1499.41 ml  Output 3574.9 ml  Net -2075.49 ml    Physical Exam    General: Chronically-ill appearing. No distress on [place] Cardiac: S1 and S2 present. No murmurs. Abdomen: Soft, non-tender, non-distended.  Extremities: Warm and dry.  2+ BLE edema.  Neuro: Drowsy, more alert than day prior.  Labs    CBC Recent Labs    06/19/24 0530 06/20/24 0428  WBC 4.9 7.2  HGB 10.7* 10.0*  HCT 32.1* 29.6*  MCV 84.0 81.5  PLT 102* 97*   Basic Metabolic Panel Recent Labs    90/69/74 0530 06/19/24 1915 06/20/24 0420 06/20/24 0428  NA 133* 134* 132*  --   K 4.5 4.3 4.4  --   CL 103 103 101  --   CO2 18* 18* 20*  --   GLUCOSE 193* 167* 157*  --   BUN 47* 44* 31*  --   CREATININE 2.27* 2.23* 1.82*  --   CALCIUM  8.5* 8.2* 8.1*  --   MG 2.0  --   --  2.0  PHOS  --  4.5 3.1  --    Liver Function Tests Recent Labs    06/17/24 1813 06/19/24 1915 06/20/24 0420  AST 14*  --   --   ALT 12  --   --    ALKPHOS 41  --   --   BILITOT 1.1  --   --   PROT 6.8  --   --   ALBUMIN 3.0* 2.9* 3.0*   BNP (last 3 results) Recent Labs    06/17/24 1813  BNP 1,998.5*   Medications:    Scheduled Medications:  atorvastatin   80 mg Oral Daily   Chlorhexidine  Gluconate Cloth  6 each Topical Daily   insulin  aspart  0-15 Units Subcutaneous TID WC   insulin  aspart  0-5 Units Subcutaneous QHS   levothyroxine   50 mcg Oral Q0600   scopolamine   1 patch Transdermal Q72H   sodium chloride  flush  10-40 mL Intracatheter Q12H   sodium chloride  flush  3 mL Intravenous Q12H   sodium chloride  flush  3 mL Intravenous Q12H    Infusions:  sodium chloride      amiodarone 60 mg/hr (06/20/24 1000)   cefTRIAXone  (ROCEPHIN )  IV Stopped (06/19/24 2213)   heparin  900 Units/hr (06/20/24 1000)   milrinone 0.375 mcg/kg/min (06/20/24 1000)   norepinephrine  (LEVOPHED ) Adult infusion 7 mcg/min (  06/20/24 1000)   prismasol BGK 4/2.5 400 mL/hr at 06/19/24 1614   prismasol BGK 4/2.5 400 mL/hr at 06/20/24 0544   prismasol BGK 4/2.5 1,500 mL/hr at 06/20/24 1001   promethazine  (PHENERGAN ) injection (IM or IVPB) Stopped (06/20/24 0431)    PRN Medications: sodium chloride , acetaminophen  **OR** acetaminophen , albuterol , bisacodyl, heparin , ondansetron  **OR** ondansetron  (ZOFRAN ) IV, mouth rinse, promethazine  (PHENERGAN ) injection (IM or IVPB), senna-docusate, sodium chloride , sodium chloride  flush, sodium chloride  flush  Patient Profile   ANUHEA GASSNER is a 73 y.o.  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   Assessment/Plan   Acute on Chronic Biventricular Heart Failure  New onset RV Failure - mixed ischemic, nonischemic, EF initially down to 25-30% in 2015, LV dysfunction felt to be out of proportion to CAD. EF has been stable in 30-35% - LA normal in ED, diuresis has been limited by hypotension and worsening  renal function concerning for low ouput - would favor getting back to NSR, suspect that decompensation related to atrial fibrillation - echo 9/30: EF 20-25%, mod LVH, D-shaped septum, RV mod reduced, degenerative MR severe MR - RHC 9/30: RA 17, PA 63/35 (46), PCW 40, Fick 5.7/2.8, TD 3.7/1.8, PVR 1.6, PAPi 1.6  - co-ox 79% CVP 11 - continue milrinone to 0.375 mcg/kg/min - continue NE for BP support, goal MAP >65 - oliguric with maximal diuretics; no won CVVHD - continue volume removal  Persistent Atrial Fibrillation - new onset 8/25 - rates up with milrinone - continue heparin  gtt - increase amio to 60 mg/hr - may need DCCV after diuresed if remains in AF  Severe MR - not noted on last echo 5/23 - severe on echo yesterday with degenerative valve   Acute renal failure - cardio-renal  - oliguric with maximal diuretics, now on CRRT - continue volume removal - hopeful for renal recovery   CAD - LHC in 2015 showed 99% RCA occlusion with collaterals - hs-trop 35>35, not concerning for ACS - continue atorva 80 mg daily  Nausea - suspect related to CHF exac/poss low outputHF - prn phenergan  and zofran  - add scopolamine  patch  Length of Stay: 3  CRITICAL CARE Performed by: [place] Lee  Total critical care time: 13 minutes  -Critical care time was exclusive of separately billable procedures and treating other patients. -Critical care was necessary to treat or prevent imminent or life-threatening deterioration. -Critical care was time spent personally by me on the following activities: development of treatment plan with patient and/or surrogate as well as nursing, discussions with consultants, evaluation of patient's response to treatment, examination of patient, obtaining history from patient or surrogate, ordering and performing treatments and interventions, ordering and review of laboratory studies, ordering and review of radiographic studies, pulse oximetry and re-evaluation of  patient's condition.  [place] Lee, NP  06/20/2024, 10:22 AM  Advanced Heart Failure Team Pager [phone] (M-F; 7a - 5p)  Please contact [facility] for night-coverage after hours (5p -7a ) and weekends on [email or web address]  The patient indicates understanding of these issues and agrees with the plan. With above  Now on NE and milrinone. Co-ox high. On CVVHD not making much urine. Very fatigued but breathing and nausea imrpvoed.   On IV amio for rate control of chronic AF. On heparin    General:  ill appearing.  HEENT: normal Neck: supple. RIJ swan  Rnm:Pmmzhlojm  Lungs:+ crackles Abdomen: obese + distended.[name] Richard  bowel sounds. Extremities: no cyanosis, clubbing, rash, 2+ edema Neuro: alert & orientedx3, cranial nerves grossly intact. moves all 4 extremities w/o difficulty. Affect pleasant  She as severe cardiorenal syndrome. Volume overload improving with CVVHD. Will continue to pull fluid with NE support. Can wean milrinone.   I worry about progressive renal failure.  Will follow closely  Continue IV amio/heparin .   CRITICAL CARE Performed by: Cherrie Sieving  Total critical care time: 38 minutes  Critical care time was exclusive of separately billable procedures and treating other patients.  Critical care was necessary to treat or prevent imminent or life-threatening deterioration.  Critical care was time spent personally by me (independent of midlevel providers or residents) on the following activities: development of treatment plan with patient and/or surrogate as well as nursing, discussions with consultants, evaluation of patient's response to treatment, examination of patient, obtaining history from patient or surrogate, ordering and performing treatments and interventions, ordering and review of laboratory studies, ordering and review of radiographic studies, pulse oximetry and re-evaluation of patient's condition.  Sieving Cherrie, MD  12:47 AM  .

## 2024-06-20 NOTE — Progress Notes (Signed)
 PHARMACY - ANTICOAGULATION CONSULT NOTE  Pharmacy Consult:  Heparin  Indication: atrial fibrillation  Allergies  Allergen Reactions   Other Other (See Comments)    FLAGYL , CIPRO , PROTONIX  taken at the same time caused patient to lose her breath and have trouble breathing, requiring hospital stay at [facility], 03/23/14-per patient.    Fentanyl  Nausea Only    Room was spinning prefers not to take it   Lisinopril  Cough    Patient Measurements: Weight: 98.8 kg (217 lb 13 oz) Heparin  dosing weight = 79 kg  Vital Signs: Temp: 98.8 F (37.1 C) (10/01 0815) Temp Source: Core (10/01 0600) BP: 95/64 (10/01 0815) Pulse Rate: 106 (10/01 0815)  Labs: Recent Labs    06/17/24 1813 06/17/24 1942 06/17/24 2005 06/18/24 0152 06/18/24 0630 06/19/24 0530 06/19/24 1915 06/20/24 0420 06/20/24 0428 06/20/24 0653  HGB 11.9*   < >  --    < > 12.1 10.7*  --   --  10.0*  --   HCT 35.5*   < >  --    < > 35.3* 32.1*  --   --  29.6*  --   PLT 110*  --   --   --  90* 102*  --   --  97*  --   APTT  --   --   --   --   --   --   --   --   --  109*  HEPARINUNFRC  --   --   --   --   --   --   --   --   --  >1.10*  CREATININE 1.59*   < >  --   --  1.62* 2.27* 2.23* 1.82*  --   --   TROPONINIHS 35*  --  35*  --   --   --   --   --   --   --    < > = values in this interval not displayed.    Estimated Creatinine Clearance: 31.1 mL/min (A) (by C-G formula based on SCr of 1.82 mg/dL (H)).   Medical History: Past Medical History:  Diagnosis Date   A-fib (HCC) 06/12/2024   Anemia    Arthritis    CAD (coronary artery disease)    a. LHC (03/2014) Lmain: nl, LAD: diff dz proximal 20%, 30-40% dz mid vessel prior 2nd diagonal, LCx: 40% in OM1, 20-30% in OM2, RCA: 99% subtotal occlusion @ crux, TIMI 1 flow, L-R collaterals to distal vessel   Cardiomyopathy (HCC) 03/28/2014   LV dysfunction out of proportion to CAD 03/28/14   CHF (congestive heart failure) (HCC)    Phreesia 10/03/2020   Chronic systolic heart  failure (HCC)    a. ECHO (03/2014): EF 25-30%, akinesis enteroanteroseptal myocardium, grade III DD b. RHC (03/2014) RA 4, RV 42/4, PA 44/14 (26), PCWP 21, PA 62% Fick CO/CI 6.9 / 3.4   Diabetes mellitus    Diabetes mellitus without complication (HCC)    Phreesia 10/03/2020   Duodenal ulcer    Age 73   Dyspnea    Erosive esophagitis    Gastritis    Hypertension    Hypothyroidism    Obese    Short-term memory loss    Thyroid  disease    TTP (thrombotic thrombocytopenic purpura) (HCC) 06/2005     Assessment: 73 YOF with cardiogenic shock, now requiring CRRT.  Pharmacy consulted to transition from Eliquis  to IV heparin , last dose on 9/30 at 1001.  Will use aPTT  to guide heparin  dosing until heparin  level and aPTT correlates.  Heparin  level came back elevated >1.1, aPTT also elevated at 109, on heparin  infusion at 1050 units/hr. Hgb 10, plt 97. No infusion issues. Coughed up some bloody sputum overnight and having oozing that has resolved at IV site. Confirmed drawn appropriately.   Goal of Therapy:  Heparin  level 0.3-0.7 units/ml aPTT 66-102 seconds Monitor platelets by anticoagulation protocol: Yes   Plan:  Reduce IV heparin  to 900 units/hr Check 8 hr aPTT Daily heparin  level, aPTT and CBC  Thank you for allowing pharmacy to participate in this patient's care,  Suzen Sour, PharmD, BCCCP Clinical Pharmacist  Phone: [phone] 06/20/2024 8:25 AM  Please check AMION for all [facility] Pharmacy phone numbers After 10:00 PM, call Main Pharmacy [phone]

## 2024-06-20 NOTE — Progress Notes (Signed)
 Initial Nutrition Assessment  DOCUMENTATION CODES:   Non-severe (moderate) malnutrition in context of chronic illness  INTERVENTION:   If pt unable to tolerate po diet with diet advancement over the next 48 hours or so, recommend considering nutrition support. Pt with poor oral intake prior to admission as well, +malnutrition  Once tolerating po diet, plan to add oral nutrition supplements  Add Renal MVI daily  NUTRITION DIAGNOSIS:   Moderate Malnutrition related to chronic illness as evidenced by mild fat depletion, moderate muscle depletion.  GOAL:   Patient will meet greater than or equal to 90% of their needs  MONITOR:   PO intake, Diet advancement, Labs, Weight trends  REASON FOR ASSESSMENT:   Consult Assessment of nutrition requirement/status (CRRT)  ASSESSMENT:   73 yo female presents with progress weakness, sternal pressure, shortness of breath and decrease intake for many days and admitted for acute on chronic heart failure with respiratory failure with bilateral pleural effusions and hx of untreated OSA, AKI on CKD. PMH includes CAD, A.Fib,  biventricular HFrE s/p BiVen ICD, CKD 3a, DM  09/28 Admitted 09/30 AKI-worsening, CRRT initiated  Pt's granddaughter-in-law at bedside; she also serves as pt's in home nurse aid.   Remains on CRRT, remains essentially anuric Levophed  7-8 mcg/min, milrinone at 0.375, amiodarone gtt at 60  Cuff pressures: Noted some MAPs <60, Systolic <90  Pt currently with significant nausea, not improved with medicine. Currently not able to take any CL secondary to this. Poor perfusion to GI tract likely contributing to nausea at present.   Noted pt on heart healthy carb modified diet on admission but only one meal intake recorded, 0% of lunch on 9/29.   +flatus, +BMs.  Per pt and family, pt has been experiencing nausea prior to admission, at least every other day, which has negatively impacted oral intake.   Per chart review, pt has  been able to perform ADLs up until a couple weeks ago. Pt reports she was unable to stand on Sunday prior to admission. At baseline, pt ambulates but only short distances.   Current wt 98.8 kg  Labs: Sodium 132 Potassium 4.4 Phosphorus 3.1 Magnesium  2.0  Meds: SS novolog  Zofran  prn Phenergan  prn Scopolamine  patch Senna-docusate prn Dulcolax prn  NUTRITION - FOCUSED PHYSICAL EXAM:  Flowsheet Row Most Recent Value  Orbital Region Mild depletion  Upper Arm Region Mild depletion  Thoracic and Lumbar Region Unable to assess  Buccal Region Moderate depletion  Temple Region Moderate depletion  Clavicle Bone Region Moderate depletion  Clavicle and Acromion Bone Region Severe depletion  Scapular Bone Region Severe depletion  Dorsal Hand Unable to assess  Patellar Region Unable to assess  Anterior Thigh Region Unable to assess  Posterior Calf Region Unable to assess  Edema (RD Assessment) Moderate    Diet Order:   Diet Order             Diet clear liquid Room service appropriate? Yes; Fluid consistency: Thin  Diet effective midnight                   EDUCATION NEEDS:   Education needs have been addressed  Skin:  Skin Assessment: Reviewed RN Assessment  Last BM:  10/01  Height:   Ht Readings from Last 1 Encounters:  06/12/24 5' 5 (1.651 m)    Weight:   Wt Readings from Last 1 Encounters:  06/20/24 98.8 kg     BMI:  Body mass index is 36.25 kg/m.  Estimated Nutritional Needs:  Kcal:  1700-1900 kcals  Protein:  100-125 g  Fluid:  1.5L   Betsey Finger MS, RDN, LDN, CNSC Registered Dietitian 3 Clinical Nutrition RD Inpatient Contact Info in Amion

## 2024-06-20 DEATH — deceased

## 2024-06-21 DIAGNOSIS — I4819 Other persistent atrial fibrillation: Secondary | ICD-10-CM | POA: Diagnosis not present

## 2024-06-21 DIAGNOSIS — I5082 Biventricular heart failure: Secondary | ICD-10-CM | POA: Diagnosis not present

## 2024-06-21 LAB — RENAL FUNCTION PANEL
Albumin: 3 g/dL — ABNORMAL LOW (ref 3.5–5.0)
Albumin: 3 g/dL — ABNORMAL LOW (ref 3.5–5.0)
Anion gap: 10 (ref 5–15)
Anion gap: 9 (ref 5–15)
BUN: 12 mg/dL (ref 8–23)
BUN: 10 mg/dL (ref 8–23)
CO2: 22 mmol/L (ref 22–32)
CO2: 23 mmol/L (ref 22–32)
Calcium: 8.1 mg/dL — ABNORMAL LOW (ref 8.9–10.3)
Calcium: 8.1 mg/dL — ABNORMAL LOW (ref 8.9–10.3)
Chloride: 99 mmol/L (ref 98–111)
Chloride: 100 mmol/L (ref 98–111)
Creatinine, Ser: 1.44 mg/dL — ABNORMAL HIGH (ref 0.44–1.00)
Creatinine, Ser: 1.43 mg/dL — ABNORMAL HIGH (ref 0.44–1.00)
GFR, Estimated: 38 mL/min — ABNORMAL LOW (ref >60)
GFR, Estimated: 38 mL/min — ABNORMAL LOW (ref >60)
Glucose, Bld: 171 mg/dL — ABNORMAL HIGH (ref 70–99)
Glucose, Bld: 151 mg/dL — ABNORMAL HIGH (ref 70–99)
Phosphorus: 2.3 mg/dL — ABNORMAL LOW (ref 2.5–4.6)
Phosphorus: 2.9 mg/dL (ref 2.5–4.6)
Potassium: 4.3 mmol/L (ref 3.5–5.1)
Potassium: 4.2 mmol/L (ref 3.5–5.1)
Sodium: 131 mmol/L — ABNORMAL LOW (ref 135–145)
Sodium: 132 mmol/L — ABNORMAL LOW (ref 135–145)

## 2024-06-21 LAB — GLUCOSE, CAPILLARY
Glucose-Capillary: 179 mg/dL — ABNORMAL HIGH (ref 70–99)
Glucose-Capillary: 160 mg/dL — ABNORMAL HIGH (ref 70–99)
Glucose-Capillary: 154 mg/dL — ABNORMAL HIGH (ref 70–99)
Glucose-Capillary: 187 mg/dL — ABNORMAL HIGH (ref 70–99)

## 2024-06-21 LAB — MAGNESIUM: Magnesium: 2.1 mg/dL (ref 1.7–2.4)

## 2024-06-21 LAB — CBC
HCT: 28.8 % — ABNORMAL LOW (ref 36.0–46.0)
Hemoglobin: 10 g/dL — ABNORMAL LOW (ref 12.0–15.0)
MCH: 28.7 pg (ref 26.0–34.0)
MCHC: 34.7 g/dL (ref 30.0–36.0)
MCV: 82.8 fL (ref 80.0–100.0)
Platelets: 83 K/uL — ABNORMAL LOW (ref 150–400)
RBC: 3.48 MIL/uL — ABNORMAL LOW (ref 3.87–5.11)
RDW: 19.6 % — ABNORMAL HIGH (ref 11.5–15.5)
WBC: 7.6 K/uL (ref 4.0–10.5)
nRBC: 0 % (ref 0.0–0.2)

## 2024-06-21 LAB — COOXEMETRY PANEL
Carboxyhemoglobin: 1.7 % — ABNORMAL HIGH (ref 0.5–1.5)
Methemoglobin: <0.7 % (ref 0.0–1.5)
O2 Saturation: 72.2 %
Total hemoglobin: 10.4 g/dL — ABNORMAL LOW (ref 12.0–16.0)

## 2024-06-21 LAB — HEPARIN LEVEL (UNFRACTIONATED): Heparin Unfractionated: >1.1 [IU]/mL — ABNORMAL HIGH (ref 0.30–0.70)

## 2024-06-21 LAB — APTT: aPTT: 101 s — ABNORMAL HIGH (ref 24–36)

## 2024-06-21 MED ORDER — ENSURE PLUS HIGH PROTEIN PO LIQD
237.0000 mL | Freq: Two times a day (BID) | ORAL | Status: DC
  Administered 2024-06-21 – 2024-06-22 (×2): 237 mL via ORAL

## 2024-06-21 MED ORDER — TRIMETHOBENZAMIDE HCL 100 MG/ML IM SOLN: 200.0000 mg | Freq: Four times a day (QID) | INTRAMUSCULAR | Status: DC | PRN

## 2024-06-21 MED ORDER — TRIMETHOBENZAMIDE HCL 100 MG/ML IM SOLN
200.0000 mg | Freq: Four times a day (QID) | INTRAMUSCULAR | Status: DC | PRN
  Filled 2024-06-21: qty 2

## 2024-06-21 MED ORDER — SODIUM PHOSPHATES 45 MMOLE/15ML IV SOLN
15.0000 mmol | Freq: Once | INTRAVENOUS | Status: AC
  Administered 2024-06-21: 15 mmol via INTRAVENOUS
  Filled 2024-06-21: qty 5

## 2024-06-21 MED ORDER — NOREPINEPHRINE 16 MG/250ML-% IV SOLN
0.0000 ug/min | INTRAVENOUS | Status: DC
  Administered 2024-06-22: 15 ug/min via INTRAVENOUS
  Administered 2024-06-22: 10 ug/min via INTRAVENOUS
  Administered 2024-06-23: 18 ug/min via INTRAVENOUS
  Administered 2024-06-24 (×3): 20 ug/min via INTRAVENOUS
  Administered 2024-06-25: 18 ug/min via INTRAVENOUS
  Administered 2024-06-26: 15 ug/min via INTRAVENOUS
  Administered 2024-06-26: 19 ug/min via INTRAVENOUS
  Administered 2024-06-27: 10 ug/min via INTRAVENOUS
  Filled 2024-06-21 (×11): qty 250

## 2024-06-21 NOTE — Progress Notes (Addendum)
 Advanced Heart Failure Rounding Note  Cardiologist: None  Chief Complaint: CHF Subjective:    RHC 9/30: RA 17, PA 63/35 (46), PCW 40, Fick 5.7/2.8, TD 3.7/1.8, PVR 1.6, PAPi 1.6 + HDC placed  Coox 72% on Milrinone 0.375 + NE 5 + VP 0.01 CVP 15. Net -2.7L on CRRT  Hemodynamic: CVP 18 PAP 69/24 CO 4.2 CI 2.1  Lying in bed. Reports feeling better this morning, although remains nauseated and tired.   Objective:    Weight Range: 94.6 kg Body mass index is 34.71 kg/m.   Vital Signs:   Temp:  [98.2 F (36.8 C)-99.1 F (37.3 C)] 99 F (37.2 C) (10/02 0715) Pulse Rate:  [74-129] 117 (10/02 0715) Resp:  [0-34] 17 (10/02 0715) BP: (75-113)/(47-96) 95/62 (10/02 0715) SpO2:  [83 %-100 %] 92 % (10/02 0715) FiO2 (%):  [32 %] 32 % (10/01 2339) Weight:  [94.6 kg] 94.6 kg (10/02 0500) Last BM Date : 06/20/24  Weight change: Filed Weights   06/19/24 0500 06/20/24 0515 06/21/24 0500  Weight: 97.8 kg 98.8 kg 94.6 kg   Intake/Output:  Intake/Output Summary (Last 24 hours) at 06/21/2024 0755 Last data filed at 06/21/2024 0700 Gross per 24 hour  Intake 2661.44 ml  Output 5410.4 ml  Net -2748.96 ml    Physical Exam    General: Elderly, pale appearing. No distress on RA Cardiac: JVP ~10cm. S1 and S2 present. No murmurs. Resp: fine crackles throughout Abdomen: Soft, non-tender, non-distended.  Extremities: Warm and dry.  Trace BLE edema.  Neuro: Alert and oriented x3.   Telemetry    AF 100-110s with LBBB (personally reviewed)  Labs    CBC Recent Labs    06/20/24 0428 06/21/24 0437  WBC 7.2 7.6  HGB 10.0* 10.0*  HCT 29.6* 28.8*  MCV 81.5 82.8  PLT 97* 83*   Basic Metabolic Panel Recent Labs    89/98/74 0428 06/20/24 1645 06/21/24 0437  NA  --  132* 131*  K  --  4.1 4.3  CL  --  101 99  CO2  --  23 22  GLUCOSE  --  146* 171*  BUN  --  19 12  CREATININE  --  1.65* 1.44*  CALCIUM   --  7.9* 8.1*  MG 2.0  --  2.1  PHOS  --  2.3* 2.3*   Liver Function  Tests Recent Labs    06/20/24 1645 06/21/24 0437  ALBUMIN 2.8* 3.0*   BNP (last 3 results) Recent Labs    06/17/24 1813  BNP 1,998.5*   Medications:    Scheduled Medications:  atorvastatin   80 mg Oral Daily   Chlorhexidine  Gluconate Cloth  6 each Topical Daily   insulin  aspart  0-15 Units Subcutaneous TID WC   insulin  aspart  0-5 Units Subcutaneous QHS   levothyroxine   50 mcg Oral Q0600   multivitamin  1 tablet Oral QHS   scopolamine   1 patch Transdermal Q72H   sodium chloride  flush  10-40 mL Intracatheter Q12H   sodium chloride  flush  3 mL Intravenous Q12H   sodium chloride  flush  3 mL Intravenous Q12H    Infusions:  amiodarone 60 mg/hr (06/21/24 0700)   cefTRIAXone  (ROCEPHIN )  IV Stopped (06/20/24 2151)   heparin  900 Units/hr (06/21/24 0700)   milrinone 0.375 mcg/kg/min (06/21/24 0700)   norepinephrine  (LEVOPHED ) Adult infusion 5 mcg/min (06/21/24 0700)   prismasol BGK 4/2.5 400 mL/hr at 06/21/24 0618   prismasol BGK 4/2.5 400 mL/hr at 06/21/24 [phone]  prismasol BGK 4/2.5 1,500 mL/hr at 06/21/24 9382   promethazine  (PHENERGAN ) injection (IM or IVPB) Stopped (06/20/24 2225)   vasopressin 0.01 Units/min (06/21/24 0700)    PRN Medications: acetaminophen  **OR** acetaminophen , albuterol , bisacodyl, heparin , ondansetron  **OR** ondansetron  (ZOFRAN ) IV, mouth rinse, promethazine  (PHENERGAN ) injection (IM or IVPB), senna-docusate, sodium chloride , sodium chloride  flush, sodium chloride  flush  Patient Profile   [name] is a 73 y.o.  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   Assessment/Plan   Acute on Chronic Biventricular Heart Failure  New onset RV Failure - mixed ischemic, nonischemic, EF initially down to 25-30% in 2015, LV dysfunction felt to be out of proportion to CAD. EF has been stable in 30-35% - LA normal in ED, diuresis has been limited by  hypotension and worsening renal function concerning for low ouput - would favor getting back to NSR, suspect that decompensation related to atrial fibrillation - echo 9/30: EF 20-25%, mod LVH, D-shaped septum, RV mod reduced, degenerative MR severe MR - RHC 9/30: RA 17, PA 63/35 (46), PCW 40, Fick 5.7/2.8, TD 3.7/1.8, PVR 1.6, PAPi 1.6  - co-ox 72%, CVP 15 - continue milrinone to 0.375 mcg/kg/min, wean once diuresed - continue NE, VP for BP support, goal MAP >70 for renal perfusion, down on pressors overnight - on CRRT for volume removal; goal CVP 8  Persistent Atrial Fibrillation - new onset 8/25 - rates up with milrinone - continue heparin  gtt - continue amio to 60 mg/hr - may need DCCV after diuresed if remains in AF  Severe MR - not noted on last echo 5/23 - severe on echo 9/30 with degenerative valve   Acute renal failure - cardio-renal  - oliguric with maximal diuretics, now on CRRT - continue volume removal - hopeful for renal recovery, would not be a good HD candidate   CAD - LHC in 2015 showed 99% RCA occlusion with collaterals - hs-trop 35>35, not concerning for ACS - continue atorva 80 mg daily  Nausea - suspect related to CHF exac/poss low outputHF - prn zofran  - has scopolamine  patch - add tigan  Length of Stay: 4  CRITICAL CARE Performed by: [place] Lee  Total critical care time: 12 minutes  -Critical care time was exclusive of separately billable procedures and treating other patients. -Critical care was necessary to treat or prevent imminent or life-threatening deterioration. -Critical care was time spent personally by me on the following activities: development of treatment plan with patient and/or surrogate as well as nursing, discussions with consultants, evaluation of patient's response to treatment, examination of patient, obtaining history from patient or surrogate, ordering and performing treatments and interventions, ordering and review of  laboratory studies, ordering and review of radiographic studies, pulse oximetry and re-evaluation of patient's condition.  [place] Lee, NP  06/21/2024, 7:55 AM  Advanced Heart Failure Team Pager [phone] (M-F; 7a - 5p)  Please contact [facility] for night-coverage after hours (5p -7a ) and weekends on [email or web address]   Agree with above  Non on NE 5, VP 0.01 and milrinone. Co-ox 73%. CVP 15 Remains anuric. On CVVHD   Remains in AF. Rates low 100s. On heparin   Feels weak. Denies SOB  General:  Weak appearing. No resp difficulty HEENT: normal Neck: supple.RIJ swan  [facility] HD cath Cor: Irreg Lungs: clear Abdomen: obese soft, nontender, nondistended. No hepatosplenomegaly. No bruits or masses. Good bowel sounds. Extremities: no cyanosis,  clubbing, rash, 1+ edema Neuro: alert & orientedx3, cranial nerves grossly intact. moves all 4 extremities w/o difficulty. Affect pleasant  Remains volume overloaded in setting of severe cardiorenal syndrome. Continue to pull with CVVHD and pressor support. I am concerned that she may not get renal recovery.   Will need DC-CV when optimized. Continue amio/heparin .   CRITICAL CARE Performed by: Concetta Guion  Total critical care time: 40 minutes  Critical care time was exclusive of separately billable procedures and treating other patients.  Critical care was necessary to treat or prevent imminent or life-threatening deterioration.  Critical care was time spent personally by me (independent of midlevel providers or residents) on the following activities: development of treatment plan with patient and/or surrogate as well as nursing, discussions with consultants, evaluation of patient's response to treatment, examination of patient, obtaining history from patient or surrogate, ordering and performing treatments and interventions, ordering and review of laboratory studies, ordering and review of radiographic studies, pulse oximetry and re-evaluation of  patient's condition.  Toribio Fuel, MD  2:16 PM

## 2024-06-21 NOTE — Progress Notes (Signed)
 Nephrology Follow-Up Consult note   Assessment/Recommendations: [name] is a/an 73 y.o. female with a past medical history significant for systolic & diastolic heart failure s/p BiV ICD, CAD, OSA on BiPAP, LBBB, HTN, new Afib who presents to with Chest pain, SOB, cough.     Anuric AKI (not improving): Likely secondary to new RV heart failure, persistent atrial fibrillation, Cardiorenal Syndrome  -Did not produce significant urine output despite high-dose diuretics.  RHC with concern for volume overload.  On pressor support Continue CRRT: 9/30-now  -Chart reviewed: (medications acceptable, does not appear to have been exposed to nephrotoxins with imaging or had episodes of significant hypotension).   -Continue to monitor daily Cr, Dose meds for GFR<15 -Monitor Daily I/Os, Daily weight  -Maintain MAP>65 for optimal renal perfusion.  -Agree with holding ACE-I, avoid further nephrotoxins including NSAIDS, Morphine .  Unless absolutely necessary, avoid CT with contrast and/or MRI with gadolinium.        Volume Status: Appears volume overloaded on exam. Based on our examination and review of available imaging, our recommendation is CRRT as above.   Electrolytes  Hyponatremia: Mild, stable.  Likely due to volume overload state of heart failure.  Should improve with CRRT  Hypophosphatemia: Due to CRRT.  On replacement protocol.   Metabolic acidosis: Improving.  Likely in the setting of worsening renal function.  Should improve on CRRT.   AoC BiV HF; New onset RV failure: On milrinone, for output with high-dose diuretics.  CRRT as above.   Atrial fibrillation: On amiodarone drip   CAD: On atorvastatin    Recommendations conveyed to primary service.    Evalene HERO Rosalina D[facility] 06/21/2024 6:57 AM  ___________________________________________________________  CC: Chest pain  Interval History/Subjective: Remains in ICU.  On CRRT. On amiodarone, milrinone,  norepinephrine .  Remains tachycardic.  Documented UOP 0. CRRT 100-177ml/h.  No reported events overnight.  No events plan for today.  CVP 15-16. Conversational.  Denies pain.    Medications:  Current Facility-Administered Medications  Medication Dose Route Frequency Provider Last Rate Last Admin   acetaminophen  (TYLENOL ) tablet 650 mg  650 mg Oral Q6H PRN Patel, Vishal R, MD   650 mg at 06/20/24 1305   Or   acetaminophen  (TYLENOL ) suppository 650 mg  650 mg Rectal Q6H PRN Patel, Vishal R, MD       albuterol  (PROVENTIL ) (2.5 MG/3ML) 0.083% nebulizer solution 2.5 mg  2.5 mg Nebulization Q6H PRN Patel, Vishal R, MD   2.5 mg at 06/18/24 0143   amiodarone (NEXTERONE PREMIX) 360-4.14 MG/200ML-% (1.8 mg/mL) IV infusion  60 mg/hr Intravenous Continuous Lee, [place], NP 33.3 mL/hr at 06/21/24 0617 60 mg/hr at 06/21/24 0617   atorvastatin  (LIPITOR ) tablet 80 mg  80 mg Oral Daily Patel, Vishal R, MD   80 mg at 06/20/24 0908   bisacodyl (DULCOLAX) EC tablet 5 mg  5 mg Oral Daily PRN Patel, Vishal R, MD       cefTRIAXone  (ROCEPHIN ) 2 g in sodium chloride  0.9 % 100 mL IVPB  2 g Intravenous Q24H Lee, Jordan, NP   Paused at 06/20/24 2151   Chlorhexidine  Gluconate Cloth 2 % PADS 6 each  6 each Topical Daily Lee, [place], NP   6 each at 06/20/24 1242   heparin  ADULT infusion 100 units/mL (25000 units/250mL)  900 Units/hr Intravenous Continuous Sherryll Suzen SQUIBB, RPH 9 mL/hr at 06/21/24 0600 900 Units/hr at 06/21/24 0600   heparin  injection 1,000-6,000 Units  1,000-6,000 Units CRRT PRN Windle Evalene HERO, DO  2,800 Units at 06/19/24 1711   insulin  aspart (novoLOG ) injection 0-15 Units  0-15 Units Subcutaneous TID WC Lee, Jordan, NP   2 Units at 06/20/24 1552   insulin  aspart (novoLOG ) injection 0-5 Units  0-5 Units Subcutaneous QHS Patel, Vishal R, MD       levothyroxine  (SYNTHROID ) tablet 50 mcg  50 mcg Oral Q0600 Patel, Vishal R, MD   50 mcg at 06/21/24 0622   milrinone (PRIMACOR) 20 MG/100 ML (0.2 mg/mL)  infusion  0.375 mcg/kg/min Intravenous Continuous Lee, [place], NP 10.53 mL/hr at 06/21/24 0600 0.375 mcg/kg/min at 06/21/24 0600   multivitamin (RENA-VIT) tablet 1 tablet  1 tablet Oral QHS Bensimhon, Toribio SAUNDERS, MD       norepinephrine  (LEVOPHED ) 4mg  in (0.016 mg/mL) premix infusion  0-10 mcg/min Intravenous Titrated Lee, [place], NP 18.75 mL/hr at 06/21/24 0600 5 mcg/min at 06/21/24 0600   ondansetron  (ZOFRAN ) tablet 4 mg  4 mg Oral Q6H PRN Patel, Vishal R, MD       Or   ondansetron  (ZOFRAN ) injection 4 mg  4 mg Intravenous Q6H PRN Patel, Vishal R, MD   4 mg at 06/20/24 2108   Oral care mouth rinse  15 mL Mouth Rinse PRN Bensimhon, Toribio SAUNDERS, MD       prismasol BGK 4/2.5 infusion   CRRT Continuous Windle Evalene HERO, DO 400 mL/hr at 06/21/24 0618 New Bag at 06/21/24 0618   prismasol BGK 4/2.5 infusion   CRRT Continuous Windle Evalene HERO, DO 400 mL/hr at 06/21/24 0618 New Bag at 06/21/24 0618   prismasol BGK 4/2.5 infusion   CRRT Continuous Windle Evalene HERO, DO 1,500 mL/hr at 06/21/24 0617 New Bag at 06/21/24 0617   promethazine  (PHENERGAN ) 12.5 mg in sodium chloride  0.9 % 50 mL IVPB  12.5 mg Intravenous Q6H PRN Lee, [place], NP   Stopped at 06/20/24 2225   scopolamine  (TRANSDERM-SCOP) 1 MG/3DAYS 1 mg  1 patch Transdermal Q72H Sherryll Suzen SQUIBB, RPH   1 mg at 06/20/24 9092   senna-docusate (Senokot-S) tablet 1 tablet  1 tablet Oral QHS PRN Tobie Jorie SAUNDERS, MD       sodium chloride  0.9 % primer fluid for CRRT   CRRT PRN Chrishonda Hesch M, DO       sodium chloride  flush (NS) 0.9 % injection 10-40 mL  10-40 mL Intracatheter Q12H Bensimhon, Toribio SAUNDERS, MD   10 mL at 06/20/24 2125   sodium chloride  flush (NS) 0.9 % injection 10-40 mL  10-40 mL Intracatheter PRN Bensimhon, Toribio SAUNDERS, MD       sodium chloride  flush (NS) 0.9 % injection 3 mL  3 mL Intravenous Q12H Tobie Jorie R, MD   3 mL at 06/20/24 2125   sodium chloride  flush (NS) 0.9 % injection 3 mL  3 mL Intravenous Q12H Lee, [place], NP   3 mL at  06/20/24 2125   sodium chloride  flush (NS) 0.9 % injection 3 mL  3 mL Intravenous PRN Lee, Jordan, NP       vasopressin (PITRESSIN) 20 Units in 100 mL (0.2 unit/mL) infusion-*FOR SHOCK*  0-0.03 Units/min Intravenous Continuous Lee, [place], NP 6 mL/hr at 06/21/24 0600 0.02 Units/min at 06/21/24 0600      Review of Systems: 10 systems reviewed and negative except per interval history/subjective  Physical Exam: Vitals:   06/21/24 0630 06/21/24 0645  BP: 100/70 100/67  Pulse: (!) 109 (!) 110  Resp: 18 20  Temp: 99 F (37.2 C) 99 F (37.2 C)  SpO2: 91%  94%   Total I/O In: 1578.5 [P.O.:475; I.V.:953.5; IV Piggyback:150] Out: 2548   Intake/Output Summary (Last 24 hours) at 06/21/2024 0657 Last data filed at 06/21/2024 0617 Gross per 24 hour  Intake 2626.41 ml  Output 5430.4 ml  Net -2803.99 ml   General: well-appearing, no acute distress, 2 LNC CV: Tachycardic, 2-3+ peripheral edema Lungs: Bilateral breath, normal work of breathing Abd: soft, non-tender, non-distended Skin: no visible lesions or rashes Psych: Alert Musculoskeletal: no obvious deformities [facility]  Test Results I personally reviewed new and old clinical labs and radiology tests Lab Results  Component Value Date   NA 131 (L) 06/21/2024   K 4.3 06/21/2024   CL 99 06/21/2024   CO2 22 06/21/2024   BUN 12 06/21/2024   CREATININE 1.44 (H) 06/21/2024   CALCIUM  8.1 (L) 06/21/2024   ALBUMIN 3.0 (L) 06/21/2024   PHOS 2.3 (L) 06/21/2024    CBC Recent Labs  Lab 06/19/24 0530 06/19/24 1420 06/19/24 1421 06/20/24 0428 06/21/24 0437  WBC 4.9  --   --  7.2 7.6  HGB 10.7*   < > 11.9* 10.0* 10.0*  HCT 32.1*   < > 35.0* 29.6* 28.8*  MCV 84.0  --   --  81.5 82.8  PLT 102*  --   --  97* 83*   < > = values in this interval not displayed.

## 2024-06-21 NOTE — Progress Notes (Signed)
 PHARMACY - ANTICOAGULATION CONSULT NOTE  Pharmacy Consult:  Heparin  Indication: atrial fibrillation  Allergies  Allergen Reactions   Other Other (See Comments)    FLAGYL , CIPRO , PROTONIX  taken at the same time caused patient to lose her breath and have trouble breathing, requiring hospital stay at [facility], 03/23/14-per patient.    Fentanyl  Nausea Only    Room was spinning prefers not to take it   Lisinopril  Cough    Patient Measurements: Weight: 94.6 kg (208 lb 8.9 oz) Heparin  dosing weight = 79 kg  Vital Signs: Temp: 99.1 F (37.3 C) (10/02 0515) Temp Source: Core (10/02 0400) BP: 98/72 (10/02 0515) Pulse Rate: 107 (10/02 0515)  Labs: Recent Labs    06/19/24 0530 06/19/24 1420 06/19/24 1421 06/19/24 1915 06/20/24 0420 06/20/24 0428 06/20/24 0653 06/20/24 1645 06/21/24 0437  HGB 10.7*   < > 11.9*  --   --  10.0*  --   --  10.0*  HCT 32.1*   < > 35.0*  --   --  29.6*  --   --  28.8*  PLT 102*  --   --   --   --  97*  --   --  83*  APTT  --   --   --   --   --   --  109* 100* 101*  HEPARINUNFRC  --   --   --   --   --   --  >1.10*  --  >1.10*  CREATININE 2.27*  --   --    < > 1.82*  --   --  1.65* 1.44*   < > = values in this interval not displayed.    Estimated Creatinine Clearance: 38.4 mL/min (A) (by C-G formula based on SCr of 1.44 mg/dL (H)).   Medical History: Past Medical History:  Diagnosis Date   A-fib (HCC) 06/12/2024   Anemia    Arthritis    CAD (coronary artery disease)    a. LHC (03/2014) Lmain: nl, LAD: diff dz proximal 20%, 30-40% dz mid vessel prior 2nd diagonal, LCx: 40% in OM1, 20-30% in OM2, RCA: 99% subtotal occlusion @ crux, TIMI 1 flow, L-R collaterals to distal vessel   Cardiomyopathy (HCC) 03/28/2014   LV dysfunction out of proportion to CAD 03/28/14   CHF (congestive heart failure) (HCC)    Phreesia 10/03/2020   Chronic systolic heart failure (HCC)    a. ECHO (03/2014): EF 25-30%, akinesis enteroanteroseptal myocardium, grade III DD b. RHC  (03/2014) RA 4, RV 42/4, PA 44/14 (26), PCWP 21, PA 62% Fick CO/CI 6.9 / 3.4   Diabetes mellitus    Diabetes mellitus without complication (HCC)    Phreesia 10/03/2020   Duodenal ulcer    Age 73   Dyspnea    Erosive esophagitis    Gastritis    Hypertension    Hypothyroidism    Obese    Short-term memory loss    Thyroid  disease    TTP (thrombotic thrombocytopenic purpura) (HCC) 06/2005     Assessment: 73 YOF with cardiogenic shock, now requiring CRRT.  Pharmacy consulted to transition from Eliquis  to IV heparin , last dose on 9/30 at 1001.  Will use aPTT to guide heparin  dosing until heparin  level and aPTT correlates.  Heparin  level came back elevated >1.1, aPTT also elevated at 109, on heparin  infusion at 1050 units/hr. Hgb 10, plt 97. No infusion issues. Coughed up some bloody sputum overnight and having oozing that has resolved at IV site. Confirmed drawn  appropriately.   10/2 AM update:  aPTT therapeutic x 2 Hgb stable  Goal of Therapy:  Heparin  level 0.3-0.7 units/ml aPTT 66-102 seconds Monitor platelets by anticoagulation protocol: Yes   Plan:  Cont heparin  900 units/hr Daily heparin  level, aPTT and CBC Monitor for bleeding  Lynwood Mckusick, PharmD, BCPS Clinical Pharmacist Phone: [phone]

## 2024-06-21 NOTE — Progress Notes (Signed)
 Brief Nutrition Note:  Remains anuric, on CRRT. Noted net UF 5.4 L in 24 hours. CVP 18 Levophed  at 5, Vasopressin at 0.01, Milrinone at 0.375.   Weight down to 94.6 kg today from 98.8 kg.Net negative 2.8 L per I/O flow sheet for the admission. Current dry weight unknown  Pt remains weak. Noted nausea persists, MD suspects related to CHF and possible low output HF, noted addition of Tigan today.  +large BM yesterday. Abdomen soft, BS present, +flatus.   Remains on CL diet. Inadequate oral intake day 4 in addition to reduced oral intake prior to admission with presence of malnutrition on admission.   Spoke with RN who indicates pt has not really touched her meal trays today due to nausea. Pt has had a few sips of water  with meds and a few bites of chicken broth  Discussed nutrition concerns with Advanced HF.  Plan to advance diet, add Ensure and perform Calorie Count.   RD recommends considering Cortrak placement tomorrow (Friday 10/03)-?post-pyloric, if pt not tolerating po diet or po intake not improving  If nausea persists, recommend considering addition of Reglan    Betsey Finger MS, RDN, LDN, CNSC Registered Dietitian 3 Clinical Nutrition RD Inpatient Contact Info in Amion

## 2024-06-21 NOTE — Plan of Care (Signed)
  Problem: Education: Goal: Ability to describe self-care measures that may prevent or decrease complications (Diabetes Survival Skills Education) will improve Outcome: Progressing Goal: Individualized Educational Video(s) Outcome: Progressing   Problem: Coping: Goal: Ability to adjust to condition or change in health will improve Outcome: Progressing   Problem: Fluid Volume: Goal: Ability to maintain a balanced intake and output will improve Outcome: Progressing   Problem: Health Behavior/Discharge Planning: Goal: Ability to identify and utilize available resources and services will improve Outcome: Progressing Goal: Ability to manage health-related needs will improve Outcome: Progressing   Problem: Metabolic: Goal: Ability to maintain appropriate glucose levels will improve Outcome: Progressing   Problem: Nutritional: Goal: Maintenance of adequate nutrition will improve Outcome: Progressing Goal: Progress toward achieving an optimal weight will improve Outcome: Progressing   Problem: Skin Integrity: Goal: Risk for impaired skin integrity will decrease Outcome: Progressing   Problem: Tissue Perfusion: Goal: Adequacy of tissue perfusion will improve Outcome: Progressing   Problem: Education: Goal: Ability to demonstrate management of disease process will improve Outcome: Progressing Goal: Ability to verbalize understanding of medication therapies will improve Outcome: Progressing Goal: Individualized Educational Video(s) Outcome: Progressing   Problem: Activity: Goal: Capacity to carry out activities will improve Outcome: Progressing   Problem: Cardiac: Goal: Ability to achieve and maintain adequate cardiopulmonary perfusion will improve Outcome: Progressing   Problem: Education: Goal: Knowledge of General Education information will improve Description: Including pain rating scale, medication(s)/side effects and non-pharmacologic comfort measures Outcome:  Progressing   Problem: Health Behavior/Discharge Planning: Goal: Ability to manage health-related needs will improve Outcome: Progressing   Problem: Clinical Measurements: Goal: Ability to maintain clinical measurements within normal limits will improve Outcome: Progressing Goal: Will remain free from infection Outcome: Progressing Goal: Diagnostic test results will improve Outcome: Progressing Goal: Respiratory complications will improve Outcome: Progressing Goal: Cardiovascular complication will be avoided Outcome: Progressing   Problem: Activity: Goal: Risk for activity intolerance will decrease Outcome: Progressing   Problem: Nutrition: Goal: Adequate nutrition will be maintained Outcome: Progressing   Problem: Coping: Goal: Level of anxiety will decrease Outcome: Progressing   Problem: Elimination: Goal: Will not experience complications related to bowel motility Outcome: Progressing Goal: Will not experience complications related to urinary retention Outcome: Progressing   Problem: Pain Managment: Goal: General experience of comfort will improve and/or be controlled Outcome: Progressing   Problem: Safety: Goal: Ability to remain free from injury will improve Outcome: Progressing   Problem: Skin Integrity: Goal: Risk for impaired skin integrity will decrease Outcome: Progressing   Problem: Education: Goal: Understanding of CV disease, CV risk reduction, and recovery process will improve Outcome: Progressing Goal: Individualized Educational Video(s) Outcome: Progressing   Problem: Activity: Goal: Ability to return to baseline activity level will improve Outcome: Progressing   Problem: Cardiovascular: Goal: Ability to achieve and maintain adequate cardiovascular perfusion will improve Outcome: Progressing Goal: Vascular access site(s) Level 0-1 will be maintained Outcome: Progressing   Problem: Health Behavior/Discharge Planning: Goal: Ability to  safely manage health-related needs after discharge will improve Outcome: Progressing

## 2024-06-22 ENCOUNTER — Encounter: Payer: Self-pay | Admitting: *Deleted

## 2024-06-22 ENCOUNTER — Telehealth: Payer: Self-pay | Admitting: *Deleted

## 2024-06-22 DIAGNOSIS — E440 Moderate protein-calorie malnutrition: Secondary | ICD-10-CM | POA: Insufficient documentation

## 2024-06-22 DIAGNOSIS — I5023 Acute on chronic systolic (congestive) heart failure: Secondary | ICD-10-CM | POA: Diagnosis not present

## 2024-06-22 LAB — RENAL FUNCTION PANEL
Albumin: 3 g/dL — ABNORMAL LOW (ref 3.5–5.0)
Albumin: 3 g/dL — ABNORMAL LOW (ref 3.5–5.0)
Anion gap: 12 (ref 5–15)
Anion gap: 13 (ref 5–15)
BUN: 9 mg/dL (ref 8–23)
BUN: 9 mg/dL (ref 8–23)
CO2: 23 mmol/L (ref 22–32)
CO2: 22 mmol/L (ref 22–32)
Calcium: 8.2 mg/dL — ABNORMAL LOW (ref 8.9–10.3)
Calcium: 8 mg/dL — ABNORMAL LOW (ref 8.9–10.3)
Chloride: 96 mmol/L — ABNORMAL LOW (ref 98–111)
Chloride: 97 mmol/L — ABNORMAL LOW (ref 98–111)
Creatinine, Ser: 1.25 mg/dL — ABNORMAL HIGH (ref 0.44–1.00)
Creatinine, Ser: 1.35 mg/dL — ABNORMAL HIGH (ref 0.44–1.00)
GFR, Estimated: 45 mL/min — ABNORMAL LOW (ref >60)
GFR, Estimated: 41 mL/min — ABNORMAL LOW (ref >60)
Glucose, Bld: 187 mg/dL — ABNORMAL HIGH (ref 70–99)
Glucose, Bld: 250 mg/dL — ABNORMAL HIGH (ref 70–99)
Phosphorus: 1.7 mg/dL — ABNORMAL LOW (ref 2.5–4.6)
Phosphorus: 2.5 mg/dL (ref 2.5–4.6)
Potassium: 4.1 mmol/L (ref 3.5–5.1)
Potassium: 4.1 mmol/L (ref 3.5–5.1)
Sodium: 131 mmol/L — ABNORMAL LOW (ref 135–145)
Sodium: 132 mmol/L — ABNORMAL LOW (ref 135–145)

## 2024-06-22 LAB — CBC
HCT: 28.8 % — ABNORMAL LOW (ref 36.0–46.0)
Hemoglobin: 9.8 g/dL — ABNORMAL LOW (ref 12.0–15.0)
MCH: 28.2 pg (ref 26.0–34.0)
MCHC: 34 g/dL (ref 30.0–36.0)
MCV: 83 fL (ref 80.0–100.0)
Platelets: 80 K/uL — ABNORMAL LOW (ref 150–400)
RBC: 3.47 MIL/uL — ABNORMAL LOW (ref 3.87–5.11)
RDW: 20.2 % — ABNORMAL HIGH (ref 11.5–15.5)
WBC: 6.1 K/uL (ref 4.0–10.5)
nRBC: 0.3 % — ABNORMAL HIGH (ref 0.0–0.2)

## 2024-06-22 LAB — CULTURE, BLOOD (ROUTINE X 2)
Culture: NO GROWTH
Culture: NO GROWTH

## 2024-06-22 LAB — APTT: aPTT: 66 s — ABNORMAL HIGH (ref 24–36)

## 2024-06-22 LAB — GLUCOSE, CAPILLARY
Glucose-Capillary: 212 mg/dL — ABNORMAL HIGH (ref 70–99)
Glucose-Capillary: 221 mg/dL — ABNORMAL HIGH (ref 70–99)
Glucose-Capillary: 237 mg/dL — ABNORMAL HIGH (ref 70–99)
Glucose-Capillary: 245 mg/dL — ABNORMAL HIGH (ref 70–99)
Glucose-Capillary: 223 mg/dL — ABNORMAL HIGH (ref 70–99)

## 2024-06-22 LAB — HEPARIN LEVEL (UNFRACTIONATED): Heparin Unfractionated: >1.1 [IU]/mL — ABNORMAL HIGH (ref 0.30–0.70)

## 2024-06-22 LAB — COOXEMETRY PANEL
Carboxyhemoglobin: 1.2 % (ref 0.5–1.5)
Carboxyhemoglobin: 0.8 % (ref 0.5–1.5)
Methemoglobin: 0.9 % (ref 0.0–1.5)
Methemoglobin: <0.7 % (ref 0.0–1.5)
O2 Saturation: 67.6 %
O2 Saturation: 52.6 %
Total hemoglobin: 10.4 g/dL — ABNORMAL LOW (ref 12.0–16.0)
Total hemoglobin: 10.5 g/dL — ABNORMAL LOW (ref 12.0–16.0)

## 2024-06-22 LAB — MAGNESIUM: Magnesium: 2.3 mg/dL (ref 1.7–2.4)

## 2024-06-22 MED ORDER — INSULIN ASPART 100 UNIT/ML IJ SOLN
0.0000 [IU] | Freq: Every day | INTRAMUSCULAR | Status: DC
  Administered 2024-06-22 – 2024-06-26 (×3): 2 [IU] via SUBCUTANEOUS

## 2024-06-22 MED ORDER — SODIUM PHOSPHATES 45 MMOLE/15ML IV SOLN
30.0000 mmol | Freq: Once | INTRAVENOUS | Status: AC
  Administered 2024-06-22: 30 mmol via INTRAVENOUS
  Filled 2024-06-22: qty 10

## 2024-06-22 MED ORDER — ENSURE PLUS HIGH PROTEIN PO LIQD
237.0000 mL | Freq: Three times a day (TID) | ORAL | Status: DC
Start: 2024-06-22 — End: 2024-07-17
  Administered 2024-06-22 – 2024-07-15 (×46): 237 mL via ORAL
  Filled 2024-06-22 (×2): qty 237

## 2024-06-22 MED ORDER — INSULIN ASPART 100 UNIT/ML IJ SOLN
0.0000 [IU] | Freq: Three times a day (TID) | INTRAMUSCULAR | Status: DC
  Administered 2024-06-22 – 2024-06-23 (×3): 7 [IU] via SUBCUTANEOUS
  Administered 2024-06-23: 4 [IU] via SUBCUTANEOUS
  Administered 2024-06-24: 7 [IU] via SUBCUTANEOUS
  Administered 2024-06-24: 3 [IU] via SUBCUTANEOUS
  Administered 2024-06-24: 4 [IU] via SUBCUTANEOUS
  Administered 2024-06-25: 15 [IU] via SUBCUTANEOUS
  Administered 2024-06-25: 4 [IU] via SUBCUTANEOUS
  Administered 2024-06-25 – 2024-06-26 (×2): 7 [IU] via SUBCUTANEOUS
  Administered 2024-06-26: 11 [IU] via SUBCUTANEOUS
  Administered 2024-06-26 – 2024-06-27 (×2): 4 [IU] via SUBCUTANEOUS
  Administered 2024-06-27: 7 [IU] via SUBCUTANEOUS
  Administered 2024-06-27: 11 [IU] via SUBCUTANEOUS
  Administered 2024-06-28: 4 [IU] via SUBCUTANEOUS
  Administered 2024-06-28: 7 [IU] via SUBCUTANEOUS
  Administered 2024-06-29: 3 [IU] via SUBCUTANEOUS

## 2024-06-22 MED ORDER — CALCIUM CARBONATE ANTACID 500 MG PO CHEW
200.0000 mg | CHEWABLE_TABLET | Freq: Three times a day (TID) | ORAL | Status: DC | PRN
  Administered 2024-06-22 – 2024-06-27 (×2): 200 mg via ORAL
  Filled 2024-06-22 (×2): qty 1

## 2024-06-22 MED ORDER — LOPERAMIDE HCL 2 MG PO CAPS
2.0000 mg | ORAL_CAPSULE | Freq: Four times a day (QID) | ORAL | Status: DC | PRN
  Administered 2024-06-22: 2 mg via ORAL
  Filled 2024-06-22: qty 1

## 2024-06-22 NOTE — Progress Notes (Signed)
 Nephrology Follow-Up Consult note   Assessment/Recommendations: [name] is a/an 73 y.o. female with a past medical history significant for systolic & diastolic heart failure s/p BiV ICD, CAD, OSA on BiPAP, LBBB, HTN, new Afib who presents to with Chest pain, SOB, cough.     Anuric AKI (not improving): Likely secondary to new RV heart failure, persistent atrial fibrillation, Cardiorenal Syndrome  -Did not produce significant urine output despite high-dose diuretics.  RHC with concern for volume overload.  On pressor support Continue CRRT: 9/30-now  -Chart reviewed: (medications acceptable, does not appear to have been exposed to nephrotoxins with imaging or had episodes of significant hypotension).   -Continue to monitor daily Cr, Dose meds for GFR<15 -Monitor Daily I/Os, Daily weight  -Maintain MAP>65 for optimal renal perfusion.  -Agree with holding ACE-I, avoid further nephrotoxins including NSAIDS, Morphine .  Unless absolutely necessary, avoid CT with contrast and/or MRI with gadolinium.        Volume Status: Appears volume overloaded on exam. Based on our examination and review of available imaging, our recommendation is CRRT as above.   Electrolytes  Hyponatremia: Mild, stable.  Likely due to volume overload state of heart failure.  Should improve with CRRT  Hypophosphatemia: Due to CRRT.  On replacement protocol.   Metabolic acidosis: Improving.  Likely in the setting of worsening renal function.  Should improve on CRRT.   AoC BiV HF; New onset RV failure: On milrinone, for output with high-dose diuretics.  CRRT as above.   Atrial fibrillation: On amiodarone drip   CAD: On atorvastatin    Recommendations conveyed to primary service.    [name] Novalynn [facility] 06/22/2024 7:08 AM  ___________________________________________________________  CC: Chest pain  Interval History/Subjective: Remains in ICU.  On CRRT.  On amiodarone, decreased  milrinone, increased norepinephrine  and increased vasopressin. Documented UOP 10.  Weight 94.6 kg -> 91.1 kg. Per nurse, CRRT running well.  UF on 150 mL/h.  CVP 20 Patient doing well.    Medications:  Current Facility-Administered Medications  Medication Dose Route Frequency Provider Last Rate Last Admin   acetaminophen  (TYLENOL ) tablet 650 mg  650 mg Oral Q6H PRN Patel, Vishal R, MD   650 mg at 06/22/24 0008   Or   acetaminophen  (TYLENOL ) suppository 650 mg  650 mg Rectal Q6H PRN Patel, Vishal R, MD       albuterol  (PROVENTIL ) (2.5 MG/3ML) 0.083% nebulizer solution 2.5 mg  2.5 mg Nebulization Q6H PRN Patel, Vishal R, MD   2.5 mg at 06/18/24 0143   amiodarone (NEXTERONE PREMIX) 360-4.14 MG/200ML-% (1.8 mg/mL) IV infusion  60 mg/hr Intravenous Continuous Lee, [place], NP 33.3 mL/hr at 06/22/24 0600 60 mg/hr at 06/22/24 0600   atorvastatin  (LIPITOR ) tablet 80 mg  80 mg Oral Daily Patel, Vishal R, MD   80 mg at 06/21/24 0947   bisacodyl (DULCOLAX) EC tablet 5 mg  5 mg Oral Daily PRN Patel, Vishal R, MD       Chlorhexidine  Gluconate Cloth 2 % PADS 6 each  6 each Topical Daily Lee, [place], NP   6 each at 06/21/24 1000   feeding supplement (ENSURE PLUS HIGH PROTEIN) liquid 237 mL  237 mL Oral BID BM Lee, [place], NP   237 mL at 06/21/24 1515   heparin  ADULT infusion 100 units/mL (25000 units/250mL)  900 Units/hr Intravenous Continuous Sherryll Suzen SQUIBB, RPH 9 mL/hr at 06/22/24 0613 900 Units/hr at 06/22/24 9386   heparin  injection 1,000-6,000 Units  1,000-6,000 Units CRRT PRN Roosevelt Eimers,  Christina HERO, DO   2,800 Units at 06/19/24 1711   insulin  aspart (novoLOG ) injection 0-15 Units  0-15 Units Subcutaneous TID WC Lee, Jordan, NP   3 Units at 06/21/24 1607   insulin  aspart (novoLOG ) injection 0-5 Units  0-5 Units Subcutaneous QHS Patel, Vishal R, MD       levothyroxine  (SYNTHROID ) tablet 50 mcg  50 mcg Oral Q0600 Patel, Vishal R, MD   50 mcg at 06/22/24 0558   milrinone (PRIMACOR) 20 MG/100 ML (0.2 mg/mL)  infusion  0.25 mcg/kg/min Intravenous Continuous Lee, [place], NP 7.02 mL/hr at 06/22/24 0600 0.25 mcg/kg/min at 06/22/24 0600   multivitamin (RENA-VIT) tablet 1 tablet  1 tablet Oral QHS Bensimhon, Daniel R, MD   1 tablet at 06/21/24 2124   norepinephrine  (LEVOPHED ) 16 mg in 250mL (0.064 mg/mL) premix infusion  0-10 mcg/min Intravenous Titrated Gretel Prentice BIRCH, RPH 9.38 mL/hr at 06/22/24 0600 10 mcg/min at 06/22/24 0600   ondansetron  (ZOFRAN ) tablet 4 mg  4 mg Oral Q6H PRN Patel, Vishal R, MD       Or   ondansetron  (ZOFRAN ) injection 4 mg  4 mg Intravenous Q6H PRN Patel, Vishal R, MD   4 mg at 06/22/24 0602   Oral care mouth rinse  15 mL Mouth Rinse PRN Bensimhon, Toribio SAUNDERS, MD       prismasol BGK 4/2.5 infusion   CRRT Continuous Christina Christina HERO, DO 400 mL/hr at 06/22/24 9392 New Bag at 06/22/24 0607   prismasol BGK 4/2.5 infusion   CRRT Continuous Skylier Kretschmer M, DO 400 mL/hr at 06/22/24 0606 New Bag at 06/22/24 0606   prismasol BGK 4/2.5 infusion   CRRT Continuous Christina Christina HERO, DO 1,500 mL/hr at 06/22/24 0211 New Bag at 06/22/24 0211   scopolamine  (TRANSDERM-SCOP) 1 MG/3DAYS 1 mg  1 patch Transdermal Q72H Sherryll Suzen SQUIBB, RPH   1 mg at 06/20/24 9092   senna-docusate (Senokot-S) tablet 1 tablet  1 tablet Oral QHS PRN Tobie Jorie SAUNDERS, MD       sodium chloride  0.9 % primer fluid for CRRT   CRRT PRN Westlyn Glaza M, DO       sodium chloride  flush (NS) 0.9 % injection 10-40 mL  10-40 mL Intracatheter Q12H Bensimhon, Toribio SAUNDERS, MD   10 mL at 06/21/24 2125   sodium chloride  flush (NS) 0.9 % injection 10-40 mL  10-40 mL Intracatheter PRN Bensimhon, Toribio SAUNDERS, MD       sodium chloride  flush (NS) 0.9 % injection 3 mL  3 mL Intravenous Q12H Tobie Jorie R, MD   3 mL at 06/21/24 2125   sodium chloride  flush (NS) 0.9 % injection 3 mL  3 mL Intravenous Q12H Lee, [place], NP   3 mL at 06/21/24 2125   sodium chloride  flush (NS) 0.9 % injection 3 mL  3 mL Intravenous PRN Lee, [place], NP       sodium  phosphate 30 mmol in sodium chloride  0.9 % 250 mL infusion  30 mmol Intravenous Once Serena Morna SAUNDERS, RPH       trimethobenzamide PHYLLISTINE) injection 200 mg  200 mg Intramuscular Q6H PRN Lee, Jordan, NP       vasopressin (PITRESSIN) 20 Units in 100 mL (0.2 unit/mL) infusion-*FOR SHOCK*  0-0.03 Units/min Intravenous Continuous Lee, [place], NP 6 mL/hr at 06/22/24 0600 0.02 Units/min at 06/22/24 0600      Review of Systems: 10 systems reviewed and negative except per interval history/subjective  Physical Exam: Vitals:   06/22/24 0545 06/22/24  0600  BP: 92/60 (!) 87/63  Pulse: (!) 107 (!) 102  Resp: (!) 21 (!) 25  Temp: 99.5 F (37.5 C) 99.5 F (37.5 C)  SpO2: 94% 94%   No intake/output data recorded.  Intake/Output Summary (Last 24 hours) at 06/22/2024 0708 Last data filed at 06/22/2024 0600 Gross per 24 hour  Intake 2913.93 ml  Output 6071 ml  Net -3157.07 ml   General: well-appearing, no acute distress, 2 LNC CV: Tachycardic, 1-2+ peripheral edema Lungs: Bilateral breath, normal work of breathing Abd: soft, non-tender, non-distended Skin: no visible lesions or rashes Psych: Alert Musculoskeletal: no obvious deformities LIJ  Test Results I personally reviewed new and old clinical labs and radiology tests Lab Results  Component Value Date   NA 131 (L) 06/22/2024   K 4.1 06/22/2024   CL 96 (L) 06/22/2024   CO2 23 06/22/2024   BUN 9 06/22/2024   CREATININE 1.25 (H) 06/22/2024   CALCIUM  8.2 (L) 06/22/2024   ALBUMIN 3.0 (L) 06/22/2024   PHOS 1.7 (L) 06/22/2024    CBC Recent Labs  Lab 06/20/24 0428 06/21/24 0437 06/22/24 0425  WBC 7.2 7.6 6.1  HGB 10.0* 10.0* 9.8*  HCT 29.6* 28.8* 28.8*  MCV 81.5 82.8 83.0  PLT 97* 83* 80*

## 2024-06-22 NOTE — Progress Notes (Addendum)
 PHARMACY - ANTICOAGULATION CONSULT NOTE  Pharmacy Consult:  Heparin  Indication: atrial fibrillation  Allergies  Allergen Reactions   Other Other (See Comments)    FLAGYL , CIPRO , PROTONIX  taken at the same time caused patient to lose her breath and have trouble breathing, requiring hospital stay at [facility], 03/23/14-per patient.    Fentanyl  Nausea Only    Room was spinning prefers not to take it   Lisinopril  Cough    Patient Measurements: Weight: 91.1 kg (200 lb 13.4 oz) Heparin  dosing weight = 79 kg  Vital Signs: Temp: 99.5 F (37.5 C) (10/03 0715) Temp Source: Core (10/02 1930) BP: 89/68 (10/03 0715) Pulse Rate: 103 (10/03 0715)  Labs: Recent Labs    06/20/24 0428 06/20/24 9346 06/20/24 0653 06/20/24 1645 06/21/24 0437 06/21/24 1531 06/22/24 0425  HGB 10.0*  --   --   --  10.0*  --  9.8*  HCT 29.6*  --   --   --  28.8*  --  28.8*  PLT 97*  --   --   --  83*  --  80*  APTT  --  109*   < > 100* 101*  --  66*  HEPARINUNFRC  --  >1.10*  --   --  >1.10*  --  >1.10*  CREATININE  --   --   --  1.65* 1.44* 1.43* 1.25*   < > = values in this interval not displayed.    Estimated Creatinine Clearance: 43.3 mL/min (A) (by C-G formula based on SCr of 1.25 mg/dL (H)).   Medical History: Past Medical History:  Diagnosis Date   A-fib (HCC) 06/12/2024   Anemia    Arthritis    CAD (coronary artery disease)    a. LHC (03/2014) Lmain: nl, LAD: diff dz proximal 20%, 30-40% dz mid vessel prior 2nd diagonal, LCx: 40% in OM1, 20-30% in OM2, RCA: 99% subtotal occlusion @ crux, TIMI 1 flow, L-R collaterals to distal vessel   Cardiomyopathy (HCC) 03/28/2014   LV dysfunction out of proportion to CAD 03/28/14   CHF (congestive heart failure) (HCC)    Phreesia 10/03/2020   Chronic systolic heart failure (HCC)    a. ECHO (03/2014): EF 25-30%, akinesis enteroanteroseptal myocardium, grade III DD b. RHC (03/2014) RA 4, RV 42/4, PA 44/14 (26), PCWP 21, PA 62% Fick CO/CI 6.9 / 3.4   Diabetes  mellitus    Diabetes mellitus without complication (HCC)    Phreesia 10/03/2020   Duodenal ulcer    Age 18   Dyspnea    Erosive esophagitis    Gastritis    Hypertension    Hypothyroidism    Obese    Short-term memory loss    Thyroid  disease    TTP (thrombotic thrombocytopenic purpura) (HCC) 06/2005     Assessment: 73 YOF with cardiogenic shock, now requiring CRRT.  Pharmacy consulted to transition from Eliquis  to IV heparin , last dose on 9/30 at 1001.  Will use aPTT to guide heparin  dosing until heparin  level and aPTT correlates.  06/22/24 AM : aPTT 66, borderline subtherapeutic at heparin  900 units/hr. HL >1.1, remains elevated due to recent Eliquis  administration. No issues with infusion running noted. Per RN, patient has mild bleeding from HD catheter that is new overnight. Hgb stable at 9.8, PLT slightly decreased to 80. Due to down trending aPTT, will cautiously increase heparin  rate.   Goal of Therapy:  Heparin  level 0.3-0.7 units/ml aPTT 66-102 seconds Monitor platelets by anticoagulation protocol: Yes   Plan:  Increase heparin   950 units/hr Daily heparin  level, aPTT and CBC Monitor for bleeding  Morna Breach, PharmD PGY2 Cardiology Pharmacy Resident 06/22/2024 7:26 AM

## 2024-06-22 NOTE — Progress Notes (Signed)
 Calorie Count Note  48 hour calorie count ordered.  Diet: Regular, thin liquids Supplements: Ensure Plus High Protein - BID  Estimated Nutritional Needs:  Kcal:  1700-1900 kcals Protein:  100-125 gm Fluid:  1.5L  10/2 Dinner: 0 calories, 0 gm protein 10/3 Breakfast: 80 calories, 3 gm protein 10/3 Lunch: 209 calories, 10 gm protein  Supplements: 2 Ensures (700 calories, 40 gm protein)  Total intake: 989 kcal (58% of minimum estimated needs)  53 protein (53% of minimum estimated needs)  Nutrition Diagnosis: Moderate Malnutrition related to chronic illness as evidenced by mild fat depletion, moderate muscle depletion.   Goal: Patient will meet greater than or equal to 90% of their needs   Intervention:  Increase Ensure Plus High Protein po TID, each supplement provides 350 kcal and 20 grams of protein Renal Multivitamin w/ minerals daily   Nestora Glatter RD, LDN Clinical Dietitian

## 2024-06-22 NOTE — Progress Notes (Addendum)
 Advanced Heart Failure Rounding Note  Cardiologist: None   Chief Complaint: CHF  Subjective:    RHC 9/30: RA 17, PA 63/35 (46), PCW 40, Fick 5.7/2.8, TD 3.7/1.8, PVR 1.6, PAPi 1.6 + HDC placed  10/2: Milrinone 0.375>0.25  CO-OX 67% on Milrinone 0.25 + 10 NE + 0.02 VP Net -3.3L last 24 hrs on CRRT.  Hemodynamics: CVP 18 PA 61/22 CO 4 CI 1.95  Low grade temp 99.52F, although on CRRT. No leukocytosis. Has completed abx for aspiration. No other signs of infection.  Less nauseated. PO intake improved slightly. No dyspnea. Reports some dyspepsia.  Objective:    Weight Range: 91.1 kg Body mass index is 33.42 kg/m.   Vital Signs:   Temp:  [99 F (37.2 C)-99.7 F (37.6 C)] 99.1 F (37.3 C) (10/03 0930) Pulse Rate:  [74-124] 74 (10/03 0930) Resp:  [11-28] 20 (10/03 0930) BP: (66-128)/(44-100) 88/63 (10/03 0930) SpO2:  [87 %-98 %] 94 % (10/03 0930) Weight:  [91.1 kg] 91.1 kg (10/03 0600) Last BM Date : 06/22/24  Weight change: Filed Weights   06/20/24 0515 06/21/24 0500 06/22/24 0600  Weight: 98.8 kg 94.6 kg 91.1 kg   Intake/Output:  Intake/Output Summary (Last 24 hours) at 06/22/2024 0955 Last data filed at 06/22/2024 0900 Gross per 24 hour  Intake 3069 ml  Output 6326.7 ml  Net -3257.7 ml    Physical Exam    General:  Fatigued appearing Cor: JVP difficult d/t b/l internal jugular access. Tachy. No murmurs. Lungs: breathing non Abdomen: obese, soft, nontender, nondistended. Extremities: 1+ edema Neuro: alert & orientedx3. Affect flat   Telemetry    AF 90s-100s w/ LBBB  Labs    CBC Recent Labs    06/21/24 0437 06/22/24 0425  WBC 7.6 6.1  HGB 10.0* 9.8*  HCT 28.8* 28.8*  MCV 82.8 83.0  PLT 83* 80*   Basic Metabolic Panel Recent Labs    89/97/74 0437 06/21/24 1531 06/22/24 0425  NA 131* 132* 131*  K 4.3 4.2 4.1  CL 99 100 96*  CO2 22 23 23   GLUCOSE 171* 151* 187*  BUN 12 10 9   CREATININE 1.44* 1.43* 1.25*  CALCIUM  8.1* 8.1* 8.2*   MG 2.1  --  2.3  PHOS 2.3* 2.9 1.7*   Liver Function Tests Recent Labs    06/21/24 1531 06/22/24 0425  ALBUMIN 3.0* 3.0*   BNP (last 3 results) Recent Labs    06/17/24 1813  BNP 1,998.5*   Medications:    Scheduled Medications:  atorvastatin   80 mg Oral Daily   Chlorhexidine  Gluconate Cloth  6 each Topical Daily   feeding supplement  237 mL Oral BID BM   insulin  aspart  0-15 Units Subcutaneous TID WC   insulin  aspart  0-5 Units Subcutaneous QHS   levothyroxine   50 mcg Oral Q0600   multivitamin  1 tablet Oral QHS   scopolamine   1 patch Transdermal Q72H   sodium chloride  flush  10-40 mL Intracatheter Q12H   sodium chloride  flush  3 mL Intravenous Q12H   sodium chloride  flush  3 mL Intravenous Q12H    Infusions:  amiodarone 60 mg/hr (06/22/24 0900)   heparin  950 Units/hr (06/22/24 0900)   milrinone 0.25 mcg/kg/min (06/22/24 0900)   norepinephrine  (LEVOPHED ) Adult infusion 10 mcg/min (06/22/24 0900)   prismasol BGK 4/2.5 400 mL/hr at 06/22/24 0607   prismasol BGK 4/2.5 400 mL/hr at 06/22/24 0606   prismasol BGK 4/2.5 1,500 mL/hr at 06/22/24 0906   sodium PHOSPHATE  IVPB (in mmol) 43 mL/hr at 06/22/24 0900   vasopressin 0.02 Units/min (06/22/24 0900)    PRN Medications: acetaminophen  **OR** acetaminophen , albuterol , bisacodyl, calcium  carbonate, heparin , ondansetron  **OR** ondansetron  (ZOFRAN ) IV, mouth rinse, senna-docusate, sodium chloride , sodium chloride  flush, sodium chloride  flush, trimethobenzamide  Patient Profile   GERRI ACRE is a 73 y.o.  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   Admit with acute on chronic biventricular HF in setting of AF c/b acute renal failure.  Assessment/Plan   Acute on Chronic Biventricular Heart Failure  New onset RV Failure - mixed ischemic, nonischemic, EF initially down to 25-30% in 2015, LV dysfunction felt to  be out of proportion to CAD. EF has been stable in 30-35% - LA normal in ED, diuresis has been limited by hypotension and worsening renal function concerning for low ouput - would favor getting back to NSR, suspect that decompensation related to atrial fibrillation - echo 9/30: EF 20-25%, mod LVH, D-shaped septum, RV mod reduced, degenerative MV w/ severe MR - RHC 9/30: RA 17, PA 63/35 (46), PCW 40, Fick 5.7/2.8, TD 3.7/1.8, PVR 1.6, PAPi 1.6  - co-ox 67% on 0.25 milrinone - Continue inotrope support while diuresing - vasopressor requirement slightly up overnight. Can titrate VP and NE as needed to remove volume. CVP 18 today, goal 8. Goal MAP >70 for renal perfusion. - Prognosis guarded  Persistent Atrial Fibrillation - new onset 8/25 - continue heparin  gtt - continue amio 60 mg/hr, rate 100s - may need DCCV after diuresed if remains in AF  Severe MR - not noted on last echo 5/23 - severe on echo 9/30 with degenerative valve   Acute renal failure - cardio-renal  - oliguric with maximal diuretics, now on CRRT - continue volume removal, see above - hope for renal recovery, would not be a good HD candidate   CAD - LHC in 2015 showed 99% RCA occlusion with collaterals - hs-trop 35>35, not concerning for ACS - continue atorva 80 mg daily  Nausea - suspect related to CHF exac/poss low outputHF - improved today. Encourage po intake. Calorie count.  Length of Stay: 5  CRITICAL CARE Performed by: COLLETTA SHAVER N   Total critical care time: 18 minutes  Critical care time was exclusive of separately billable procedures and treating other patients.  Critical care was necessary to treat or prevent imminent or life-threatening deterioration.  Critical care was time spent personally by me on the following activities: development of treatment plan with patient and/or surrogate as well as nursing, discussions with consultants, evaluation of patient's response to treatment, examination  of patient, obtaining history from patient or surrogate, ordering and performing treatments and interventions, ordering and review of laboratory studies, ordering and review of radiographic studies, pulse oximetry and re-evaluation of patient's condition.   FINCH, LINDSAY N, PA-C  06/22/2024, 9:55 AM  Advanced Heart Failure Team Pager [phone] (M-F; 7a - 5p)  Please contact [facility] for night-coverage after hours (5p -7a ) and weekends on [email or web address]  Agree with above  Remains on CVVHD. Failed milrinone wean today. Volume status improving. Remains in AF despite IV amio  Feel weak. Denies SOB   General:  Weak appearing. No resp difficulty HEENT: normal Neck: supple. RIJ swan. LIJ HD cath  Cor: Irreg  Lungs: clear Abdomen: soft, nontender, + distended. No hepatosplenomegaly. No bruits or masses. Good bowel sounds. Extremities: no cyanosis, clubbing,  rash, 1+ edema Neuro: alert & orientedx3, cranial nerves grossly intact. moves all 4 extremities w/o difficulty. Affect pleasant  Volume status much improved with CVVHD but now appears inotrope dependent in setting of RV failure. Will continue to remove fluid today, Likely stop tomorrow.   Will likely need DC-CV over the weekend to optimize chance of weaning milrinone.   She is adamant that she would not want outpatient dialysis. Given underlying CKD, I am concerned about risk of no-recovery. She is amenable to discussing her wishes with [facility] team.   CRITICAL CARE Performed by: Gloria Ricardo  Total critical care time: 45 minutes  Critical care time was exclusive of separately billable procedures and treating other patients.  Critical care was necessary to treat or prevent imminent or life-threatening deterioration.  Critical care was time spent personally by me (independent of midlevel providers or residents) on the following activities: development of treatment plan with patient and/or surrogate as well as nursing,  discussions with consultants, evaluation of patient's response to treatment, examination of patient, obtaining history from patient or surrogate, ordering and performing treatments and interventions, ordering and review of laboratory studies, ordering and review of radiographic studies, pulse oximetry and re-evaluation of patient's condition.  Toribio Fuel, MD  8:43 PM

## 2024-06-22 NOTE — Inpatient Diabetes Management (Signed)
 Inpatient Diabetes Program Recommendations  AACE/ADA: New Consensus Statement on Inpatient Glycemic Control (2015)  Target Ranges:  Prepandial:   less than 140 mg/dL      Peak postprandial:   less than 180 mg/dL (1-2 hours)      Critically ill patients:  140 - 180 mg/dL    Latest Reference Range & Units 06/21/24 06:07 06/21/24 11:41 06/21/24 16:00 06/21/24 21:20  Glucose-Capillary 70 - 99 mg/dL 820 (H) 839 (H) 845 (H) 187 (H)  (H): Data is abnormally high  Latest Reference Range & Units 06/22/24 08:21  Glucose-Capillary 70 - 99 mg/dL 778 (H)  (H): Data is abnormally high  Home DM Meds: Lantus  20 units BID Glucotrol  5 BID Ozempic  1 mg  Jardiance  10 mg every day    Current Orders: Novolog  Resistant Correction Scale/ SSI (0-20 units) TID AC + HS   MD- Note CBG 221 this AM.  Pt takes Lantus  20 units BID at home.  Please consider starting Lantus  6 units BID (30% total home dose)    --Will follow patient during hospitalization--  Adina Rudolpho Arrow RN, MSN, CDCES Diabetes Coordinator Inpatient Glycemic Control Team Team Pager: [phone] (8a-5p)

## 2024-06-22 NOTE — Plan of Care (Signed)
  Problem: Tissue Perfusion: Goal: Adequacy of tissue perfusion will improve Outcome: Progressing   Problem: Activity: Goal: Capacity to carry out activities will improve Outcome: Progressing

## 2024-06-23 DIAGNOSIS — I5023 Acute on chronic systolic (congestive) heart failure: Secondary | ICD-10-CM | POA: Diagnosis not present

## 2024-06-23 LAB — RENAL FUNCTION PANEL
Albumin: 3 g/dL — ABNORMAL LOW (ref 3.5–5.0)
Albumin: 2.9 g/dL — ABNORMAL LOW (ref 3.5–5.0)
Anion gap: 10 (ref 5–15)
Anion gap: 8 (ref 5–15)
BUN: 10 mg/dL (ref 8–23)
BUN: 9 mg/dL (ref 8–23)
CO2: 23 mmol/L (ref 22–32)
CO2: 24 mmol/L (ref 22–32)
Calcium: 7.8 mg/dL — ABNORMAL LOW (ref 8.9–10.3)
Calcium: 8 mg/dL — ABNORMAL LOW (ref 8.9–10.3)
Chloride: 97 mmol/L — ABNORMAL LOW (ref 98–111)
Chloride: 99 mmol/L (ref 98–111)
Creatinine, Ser: 1.29 mg/dL — ABNORMAL HIGH (ref 0.44–1.00)
Creatinine, Ser: 1.38 mg/dL — ABNORMAL HIGH (ref 0.44–1.00)
GFR, Estimated: 43 mL/min — ABNORMAL LOW (ref >60)
GFR, Estimated: 40 mL/min — ABNORMAL LOW (ref >60)
Glucose, Bld: 230 mg/dL — ABNORMAL HIGH (ref 70–99)
Glucose, Bld: 190 mg/dL — ABNORMAL HIGH (ref 70–99)
Phosphorus: 1.9 mg/dL — ABNORMAL LOW (ref 2.5–4.6)
Phosphorus: 2.6 mg/dL (ref 2.5–4.6)
Potassium: 3.9 mmol/L (ref 3.5–5.1)
Potassium: 4.5 mmol/L (ref 3.5–5.1)
Sodium: 130 mmol/L — ABNORMAL LOW (ref 135–145)
Sodium: 131 mmol/L — ABNORMAL LOW (ref 135–145)

## 2024-06-23 LAB — CBC
HCT: 29.1 % — ABNORMAL LOW (ref 36.0–46.0)
Hemoglobin: 9.7 g/dL — ABNORMAL LOW (ref 12.0–15.0)
MCH: 28.1 pg (ref 26.0–34.0)
MCHC: 33.3 g/dL (ref 30.0–36.0)
MCV: 84.3 fL (ref 80.0–100.0)
Platelets: 77 K/uL — ABNORMAL LOW (ref 150–400)
RBC: 3.45 MIL/uL — ABNORMAL LOW (ref 3.87–5.11)
RDW: 20.1 % — ABNORMAL HIGH (ref 11.5–15.5)
WBC: 7.8 K/uL (ref 4.0–10.5)
nRBC: 0.3 % — ABNORMAL HIGH (ref 0.0–0.2)

## 2024-06-23 LAB — GLUCOSE, CAPILLARY
Glucose-Capillary: 228 mg/dL — ABNORMAL HIGH (ref 70–99)
Glucose-Capillary: 212 mg/dL — ABNORMAL HIGH (ref 70–99)
Glucose-Capillary: 193 mg/dL — ABNORMAL HIGH (ref 70–99)
Glucose-Capillary: 155 mg/dL — ABNORMAL HIGH (ref 70–99)

## 2024-06-23 LAB — APTT
aPTT: 65 s — ABNORMAL HIGH (ref 24–36)
aPTT: 80 s — ABNORMAL HIGH (ref 24–36)

## 2024-06-23 LAB — COOXEMETRY PANEL
Carboxyhemoglobin: 3 % — ABNORMAL HIGH (ref 0.5–1.5)
Methemoglobin: <0.7 % (ref 0.0–1.5)
O2 Saturation: 71.9 %
Total hemoglobin: 9.9 g/dL — ABNORMAL LOW (ref 12.0–16.0)

## 2024-06-23 LAB — MAGNESIUM: Magnesium: 2.2 mg/dL (ref 1.7–2.4)

## 2024-06-23 LAB — HEPARIN LEVEL (UNFRACTIONATED): Heparin Unfractionated: 1.01 [IU]/mL — ABNORMAL HIGH (ref 0.30–0.70)

## 2024-06-23 MED ORDER — SODIUM CHLORIDE 0.9 % IV SOLN
2.0000 ug/min | INTRAVENOUS | Status: DC
  Administered 2024-06-23: 1 ug/min via INTRAVENOUS
  Filled 2024-06-23: qty 10

## 2024-06-23 MED ORDER — POTASSIUM PHOSPHATES 15 MMOLE/5ML IV SOLN
30.0000 mmol | Freq: Once | INTRAVENOUS | Status: AC
  Administered 2024-06-23: 30 mmol via INTRAVENOUS
  Filled 2024-06-23: qty 10

## 2024-06-23 NOTE — Progress Notes (Signed)
 Nephrology Follow-Up Consult note   Assessment/Recommendations: [name] is a/an 73 y.o. female with a past medical history significant for systolic & diastolic heart failure s/p BiV ICD, CAD, OSA on BiPAP, LBBB, HTN, new Afib who presents to with Chest pain, SOB, cough.     Anuric AKI (not improving): Likely secondary to new RV heart failure, persistent atrial fibrillation, Cardiorenal Syndrome  -Did not produce significant urine output despite high-dose diuretics.  RHC with concern for volume overload.  Remains on pressor support Continue CRRT: 9/30-now  -Chart reviewed: (medications acceptable, does not appear to have been exposed to nephrotoxins with imaging or had episodes of significant hypotension).   -Continue to monitor daily Cr, Dose meds for GFR<15 -Monitor Daily I/Os, Daily weight  -Maintain MAP>65 for optimal renal perfusion.  -Agree with holding ACE-I, avoid further nephrotoxins including NSAIDS, Morphine .  Unless absolutely necessary, avoid CT with contrast and/or MRI with gadolinium.        Volume Status: Appears volume overloaded but significantly improving on exam. Based on our examination and review of available imaging, our recommendation is CRRT as above.   Electrolytes  Hyponatremia: Mild, stable.  Likely due to volume overload state of heart failure.  Should improve with CRRT  Hypophosphatemia: Due to CRRT.  On replacement protocol.   Metabolic acidosis: Resolved on RRT.  Likely in the setting of worsening renal function.   AoC BiV HF; New onset RV failure: On milrinone, for output with high-dose diuretics.  CRRT as above.   Atrial fibrillation: On amiodarone drip   CAD: On atorvastatin    Recommendations conveyed to primary service.    [name] [facility] 06/23/2024 7:37 AM  ___________________________________________________________  CC: Chest pain  Interval History/Subjective: Remains in ICU.  On CRRT.  On amiodarone,  milrinone, norepinephrine , vasopressin.  UOP 5. States she is feeling better.  More conversational.  Has less shortness of breath.  Per nurse, CRRT running well; UF 150 mL an hour.  CVP 10-12.    Medications:  Current Facility-Administered Medications  Medication Dose Route Frequency Provider Last Rate Last Admin   acetaminophen  (TYLENOL ) tablet 650 mg  650 mg Oral Q6H PRN Patel, Vishal R, MD   650 mg at 06/22/24 0008   Or   acetaminophen  (TYLENOL ) suppository 650 mg  650 mg Rectal Q6H PRN Patel, Vishal R, MD       albuterol  (PROVENTIL ) (2.5 MG/3ML) 0.083% nebulizer solution 2.5 mg  2.5 mg Nebulization Q6H PRN Patel, Vishal R, MD   2.5 mg at 06/18/24 0143   amiodarone (NEXTERONE PREMIX) 360-4.14 MG/200ML-% (1.8 mg/mL) IV infusion  60 mg/hr Intravenous Continuous Lee, [place], NP 33.3 mL/hr at 06/23/24 0700 60 mg/hr at 06/23/24 0700   atorvastatin  (LIPITOR ) tablet 80 mg  80 mg Oral Daily Patel, Vishal R, MD   80 mg at 06/22/24 0911   bisacodyl (DULCOLAX) EC tablet 5 mg  5 mg Oral Daily PRN Patel, Vishal R, MD       calcium  carbonate (TUMS - dosed in mg elemental calcium ) chewable tablet 200 mg of elemental calcium   200 mg of elemental calcium  Oral TID PRN Bensimhon, Daniel R, MD   200 mg of elemental calcium  at 06/22/24 0947   Chlorhexidine  Gluconate Cloth 2 % PADS 6 each  6 each Topical Daily Lee, [place], NP   6 each at 06/22/24 0920   feeding supplement (ENSURE PLUS HIGH PROTEIN) liquid 237 mL  237 mL Oral TID BM Bensimhon, Toribio SAUNDERS, MD   [phone]  mL at 06/22/24 2003   heparin  ADULT infusion 100 units/mL (25000 units/250mL)  950 Units/hr Intravenous Continuous Katsaros, Lindsey R, RPH 9.5 mL/hr at 06/23/24 0700 950 Units/hr at 06/23/24 0700   heparin  injection 1,000-6,000 Units  1,000-6,000 Units CRRT PRN Windle Christina HERO, DO   2,800 Units at 06/19/24 1711   insulin  aspart (novoLOG ) injection 0-20 Units  0-20 Units Subcutaneous TID WC Colletta Manuelita Garre, PA-C   7 Units at 06/22/24 1714   insulin   aspart (novoLOG ) injection 0-5 Units  0-5 Units Subcutaneous QHS Colletta Manuelita Garre, PA-C   2 Units at 06/22/24 2100   levothyroxine  (SYNTHROID ) tablet 50 mcg  50 mcg Oral Q0600 Patel, Vishal R, MD   50 mcg at 06/23/24 0609   loperamide (IMODIUM) capsule 2 mg  2 mg Oral Q6H PRN Colletta Manuelita Garre, PA-C   2 mg at 06/22/24 1054   milrinone (PRIMACOR) 20 MG/100 ML (0.2 mg/mL) infusion  0.375 mcg/kg/min Intravenous Continuous Colletta Manuelita Garre, PA-C 10.53 mL/hr at 06/23/24 0700 0.375 mcg/kg/min at 06/23/24 0700   multivitamin (RENA-VIT) tablet 1 tablet  1 tablet Oral QHS Bensimhon, Toribio SAUNDERS, MD   1 tablet at 06/22/24 2100   norepinephrine  (LEVOPHED ) 16 mg in 250mL (0.064 mg/mL) premix infusion  0-20 mcg/min Intravenous Titrated Colletta Manuelita Garre, PA-C 14.06 mL/hr at 06/23/24 0700 15 mcg/min at 06/23/24 0700   ondansetron  (ZOFRAN ) tablet 4 mg  4 mg Oral Q6H PRN Patel, Vishal R, MD       Or   ondansetron  (ZOFRAN ) injection 4 mg  4 mg Intravenous Q6H PRN Patel, Vishal R, MD   4 mg at 06/22/24 0602   Oral care mouth rinse  15 mL Mouth Rinse PRN Bensimhon, Toribio SAUNDERS, MD       potassium PHOSPHATE 30 mmol in dextrose  5 % 500 mL infusion  30 mmol Intravenous Once Katsaros, Lindsey R, RPH       prismasol BGK 4/2.5 infusion   CRRT Continuous Windle Christina HERO, DO 400 mL/hr at 06/23/24 9388 New Bag at 06/23/24 0611   prismasol BGK 4/2.5 infusion   CRRT Continuous Windle Christina HERO, DO 400 mL/hr at 06/23/24 9388 New Bag at 06/23/24 0611   prismasol BGK 4/2.5 infusion   CRRT Continuous Windle Christina HERO, DO 1,500 mL/hr at 06/23/24 0611 New Bag at 06/23/24 0611   scopolamine  (TRANSDERM-SCOP) 1 MG/3DAYS 1 mg  1 patch Transdermal Q72H Sherryll Suzen SQUIBB, RPH   1 mg at 06/20/24 9092   senna-docusate (Senokot-S) tablet 1 tablet  1 tablet Oral QHS PRN Tobie Jorie SAUNDERS, MD       sodium chloride  0.9 % primer fluid for CRRT   CRRT PRN Lamesha Tibbits M, DO       sodium chloride  flush (NS) 0.9 % injection 10-40 mL   10-40 mL Intracatheter Q12H Bensimhon, Toribio SAUNDERS, MD   10 mL at 06/22/24 2101   sodium chloride  flush (NS) 0.9 % injection 10-40 mL  10-40 mL Intracatheter PRN Bensimhon, Toribio SAUNDERS, MD       sodium chloride  flush (NS) 0.9 % injection 3 mL  3 mL Intravenous Q12H Tobie Jorie R, MD   3 mL at 06/22/24 2101   sodium chloride  flush (NS) 0.9 % injection 3 mL  3 mL Intravenous Q12H Lee, [place], NP   3 mL at 06/22/24 2101   sodium chloride  flush (NS) 0.9 % injection 3 mL  3 mL Intravenous PRN Lee, Jordan, NP       trimethobenzamide PHYLLISTINE) injection  200 mg  200 mg Intramuscular Q6H PRN Lee, [place], NP       vasopressin (PITRESSIN) 20 Units in 100 mL (0.2 unit/mL) infusion-*FOR SHOCK*  0-0.03 Units/min Intravenous Continuous Lee, [place], NP 9 mL/hr at 06/23/24 0700 0.03 Units/min at 06/23/24 0700      Review of Systems: 10 systems reviewed and negative except per interval history/subjective  Physical Exam: Vitals:   06/23/24 0645 06/23/24 0700  BP: (!) 100/59 90/72  Pulse: (!) 110 (!) 114  Resp: (!) 21 18  Temp: 99.7 F (37.6 C) 99.5 F (37.5 C)  SpO2: 96% 91%   No intake/output data recorded.  Intake/Output Summary (Last 24 hours) at 06/23/2024 0737 Last data filed at 06/23/2024 0700 Gross per 24 hour  Intake 2821.09 ml  Output 6142 ml  Net -3320.91 ml   General: well-appearing, no acute distress CV: Tachycardic, 1-2+ peripheral edema Lungs: Bilateral breath, normal work of breathing Abd: soft, non-tender, non-distended Skin: no visible lesions or rashes Psych: Alert Musculoskeletal: no obvious deformities LIJ  Test Results I personally reviewed new and old clinical labs and radiology tests Lab Results  Component Value Date   NA 130 (L) 06/23/2024   K 3.9 06/23/2024   CL 97 (L) 06/23/2024   CO2 23 06/23/2024   BUN 10 06/23/2024   CREATININE 1.29 (H) 06/23/2024   CALCIUM  7.8 (L) 06/23/2024   ALBUMIN 3.0 (L) 06/23/2024   PHOS 1.9 (L) 06/23/2024    CBC Recent Labs  Lab  06/21/24 0437 06/22/24 0425 06/23/24 0507  WBC 7.6 6.1 7.8  HGB 10.0* 9.8* 9.7*  HCT 28.8* 28.8* 29.1*  MCV 82.8 83.0 84.3  PLT 83* 80* 77*

## 2024-06-23 NOTE — Progress Notes (Signed)
 OT Cancellation Note  Patient Details Name: [name] MRN: [id number] DOB: 1950-11-16   Cancelled Treatment:    Reason Eval/Treat Not Completed: Other (comment) (per RN, pt very tired, asking to hold therapy until tomorrow. Will hold and follow up for OT evaluation as appropraite.)  Kevis Qu K, OTD, OTR/L [facility] [phone]   Laneta POUR Koonce 06/23/2024, 1:13 PM

## 2024-06-23 NOTE — Progress Notes (Signed)
 PHARMACY - ANTICOAGULATION CONSULT NOTE  Pharmacy Consult:  Heparin  Indication: atrial fibrillation  Allergies  Allergen Reactions   Other Other (See Comments)    FLAGYL , CIPRO , PROTONIX  taken at the same time caused patient to lose her breath and have trouble breathing, requiring hospital stay at [facility], 03/23/14-per patient.    Fentanyl  Nausea Only    Room was spinning prefers not to take it   Lisinopril  Cough    Patient Measurements: Weight: 90.4 kg (199 lb 4.7 oz) Heparin  dosing weight = 79 kg  Vital Signs: Temp: 99.1 F (37.3 C) (10/04 1730) Temp Source: Core (Comment) (10/04 1200) BP: 91/60 (10/04 1730) Pulse Rate: 98 (10/04 1730)  Labs: Recent Labs    06/21/24 0437 06/21/24 1531 06/22/24 0425 06/22/24 1600 06/23/24 0507 06/23/24 0508 06/23/24 1628 06/23/24 1739  HGB 10.0*  --  9.8*  --  9.7*  --   --   --   HCT 28.8*  --  28.8*  --  29.1*  --   --   --   PLT 83*  --  80*  --  77*  --   --   --   APTT 101*  --  66*  --  65*  --   --  80*  HEPARINUNFRC >1.10*  --  >1.10*  --   --  1.01*  --   --   CREATININE 1.44*   < > 1.25* 1.35*  --  1.29* 1.38*  --    < > = values in this interval not displayed.    Estimated Creatinine Clearance: 39.1 mL/min (A) (by C-G formula based on SCr of 1.38 mg/dL (H)).   Medical History: Past Medical History:  Diagnosis Date   A-fib (HCC) 06/12/2024   Anemia    Arthritis    CAD (coronary artery disease)    a. LHC (03/2014) Lmain: nl, LAD: diff dz proximal 20%, 30-40% dz mid vessel prior 2nd diagonal, LCx: 40% in OM1, 20-30% in OM2, RCA: 99% subtotal occlusion @ crux, TIMI 1 flow, L-R collaterals to distal vessel   Cardiomyopathy (HCC) 03/28/2014   LV dysfunction out of proportion to CAD 03/28/14   CHF (congestive heart failure) (HCC)    [employer] 10/03/2020   Chronic systolic heart failure (HCC)    a. ECHO (03/2014): EF 25-30%, akinesis enteroanteroseptal myocardium, grade III DD b. RHC (03/2014) RA 4, RV 42/4, PA 44/14 (26), PCWP  21, PA 62% Fick CO/CI 6.9 / 3.4   Diabetes mellitus    Diabetes mellitus without complication (HCC)    [employer] 10/03/2020   Duodenal ulcer    Age 73   Dyspnea    Erosive esophagitis    Gastritis    Hypertension    Hypothyroidism    Obese    Short-term memory loss    Thyroid  disease    TTP (thrombotic thrombocytopenic purpura) (HCC) 06/2005     Assessment: 73 YOF with cardiogenic shock, now requiring CRRT.  Pharmacy consulted to transition from Eliquis  to IV heparin , last dose on 9/30 at 1001.  Will use aPTT to guide heparin  dosing until heparin  level and aPTT correlates.  06/23/24 AM: aPTT 65, subtherapeutic at heparin  950 units/hr. HL 1.01, remains elevated due to recent Eliquis  administration. No issues with infusion running noted. No further bleeding at HD catheter site per RN. CBC stable (Hgb 9.7, PLT 77).    2nd shift update - aPTT therapeutic at 80 after recent dose increase.   Goal of Therapy:  Heparin  level 0.3-0.7 units/ml  aPTT 66-102 seconds Monitor platelets by anticoagulation protocol: Yes   Plan:  Continue heparin  1050 units/hr Repeat aPTT in 8 hours to confirm  Daily heparin  level, aPTT and CBC Monitor for bleeding  Rankin Sams, PharmD, BCPS, BCCCP Clinical Pharmacist

## 2024-06-23 NOTE — Progress Notes (Signed)
 Advanced Heart Failure Rounding Note  Cardiologist: None   Chief Complaint: CHF  Subjective:    RHC 9/30: RA 17, PA 63/35 (46), PCW 40, Fick 5.7/2.8, TD 3.7/1.8, PVR 1.6, PAPi 1.6 + HDC placed  Remains on milrinone, NE and VP.  Cardiac output falling  PAP: (45-75)/(11-35) 56/20 CVP:  [7 mmHg-26 mmHg] 11 mmHg PCWP:  [22 mmHg] 22 mmHg CO:  [2.8 L/min-3.7 L/min] 3.5 L/min CI:  [1.4 L/min/m2-1.8 L/min/m2] 1.7 L/min/m2  Remains anuric. On CVVHD pulling 150/hr  In AF with RVR on IV amio and heparin   Feels weak. Denies SOB   Objective:    Weight Range: 90.4 kg Body mass index is 33.16 kg/m.   Vital Signs:   Temp:  [99 F (37.2 C)-99.9 F (37.7 C)] 99.1 F (37.3 C) (10/04 2000) Pulse Rate:  [85-124] 96 (10/04 2000) Resp:  [11-33] 17 (10/04 2000) BP: (76-124)/(42-93) 93/58 (10/04 2000) SpO2:  [76 %-100 %] 95 % (10/04 2000) Weight:  [90.4 kg] 90.4 kg (10/04 0500) Last BM Date : 06/23/24  Weight change: Filed Weights   06/21/24 0500 06/22/24 0600 06/23/24 0500  Weight: 94.6 kg 91.1 kg 90.4 kg   Intake/Output:  Intake/Output Summary (Last 24 hours) at 06/23/2024 2113 Last data filed at 06/23/2024 2000 Gross per 24 hour  Intake 2694.71 ml  Output 5807.3 ml  Net -3112.59 ml    Physical Exam    General:  Weak appearing. No resp difficulty HEENT: normal Neck: supple. RIJ wan LIJ HD cath Cor: irreg tachy Lungs: clear Abdomen: soft, nontender, nondistended. No hepatosplenomegaly. No bruits or masses. Good bowel sounds. Extremities: no cyanosis, clubbing, rash, 1+ edema Neuro: alert & orientedx3, cranial nerves grossly intact. moves all 4 extremities w/o difficulty. Affect pleasant   Telemetry    AF 90-110 Personally reviewed   Labs    CBC Recent Labs    06/22/24 0425 06/23/24 0507  WBC 6.1 7.8  HGB 9.8* 9.7*  HCT 28.8* 29.1*  MCV 83.0 84.3  PLT 80* 77*   Basic Metabolic Panel Recent Labs    89/96/74 0425 06/22/24 1600 06/23/24 0507  06/23/24 0508 06/23/24 1628  NA 131*   < >  --  130* 131*  K 4.1   < >  --  3.9 4.5  CL 96*   < >  --  97* 99  CO2 23   < >  --  23 24  GLUCOSE 187*   < >  --  230* 190*  BUN 9   < >  --  10 9  CREATININE 1.25*   < >  --  1.29* 1.38*  CALCIUM  8.2*   < >  --  7.8* 8.0*  MG 2.3  --  2.2  --   --   PHOS 1.7*   < >  --  1.9* 2.6   < > = values in this interval not displayed.   Liver Function Tests Recent Labs    06/23/24 0508 06/23/24 1628  ALBUMIN 3.0* 2.9*   BNP (last 3 results) Recent Labs    06/17/24 1813  BNP 1,998.5*   Medications:    Scheduled Medications:  atorvastatin   80 mg Oral Daily   Chlorhexidine  Gluconate Cloth  6 each Topical Daily   feeding supplement  237 mL Oral TID BM   insulin  aspart  0-20 Units Subcutaneous TID WC   insulin  aspart  0-5 Units Subcutaneous QHS   levothyroxine   50 mcg Oral Q0600   multivitamin  1 tablet Oral QHS   scopolamine   1 patch Transdermal Q72H   sodium chloride  flush  10-40 mL Intracatheter Q12H   sodium chloride  flush  3 mL Intravenous Q12H   sodium chloride  flush  3 mL Intravenous Q12H    Infusions:  amiodarone 60 mg/hr (06/23/24 2000)   epinephrine 1 mcg/min (06/23/24 2000)   heparin  1,050 Units/hr (06/23/24 2000)   milrinone 0.375 mcg/kg/min (06/23/24 2000)   norepinephrine  (LEVOPHED ) Adult infusion 20 mcg/min (06/23/24 2000)   prismasol BGK 4/2.5 400 mL/hr at 06/23/24 0900   prismasol BGK 4/2.5 400 mL/hr at 06/23/24 1350   prismasol BGK 4/2.5 1,500 mL/hr at 06/23/24 1455   vasopressin 0.03 Units/min (06/23/24 2000)    PRN Medications: acetaminophen  **OR** acetaminophen , albuterol , bisacodyl, calcium  carbonate, heparin , loperamide, ondansetron  **OR** ondansetron  (ZOFRAN ) IV, mouth rinse, senna-docusate, sodium chloride , sodium chloride  flush, sodium chloride  flush, trimethobenzamide  Patient Profile   [name] is a 73 y.o.  with history of combined systolic and diastolic heart failure s/p BiV ICD, CAD, OSA  on bipap, LBBB, HTN, hypotension, and new onset atrial fibrillation.   Admit with acute on chronic biventricular HF in setting of AF c/b acute renal failure.  Assessment/Plan   Acute on Chronic Biventricular Heart Failure  New onset RV Failure -> cardiogenic shock - mixed ischemic, nonischemic, EF initially down to 25-30% in 2015, LV dysfunction felt to be out of proportion to CAD. EF has been stable in 30-35% - LA normal in ED, diuresis has been limited by hypotension and worsening renal function concerning for low ouput - would favor getting back to NSR, suspect that decompensation related to atrial fibrillation - echo 9/30: EF 20-25%, mod LVH, D-shaped septum, RV mod reduced, degenerative MV w/ severe MR - RHC 9/30: RA 17, PA 63/35 (46), PCW 40, Fick 5.7/2.8, TD 3.7/1.8, PVR 1.6, PAPi 1.6  - Continues to deteriorate with worsening HF/shock - Will add epi  - Remains anuric and volume overloaded. Continue CVVHD - Not candidate for advanced therapies and would not want HD - She is end-stage. Will proceed with TEE/DC-CV tomorrow in a last ditch effort to try to improve her CO but suspect it may not be sufficient.  - Consult Palliative Care  Persistent Atrial Fibrillation - new onset 8/25 - continue heparin  gtt - continue amio 60 mg/hr, rate 100s - TEE/DC-CV at bedside tomorrow  Severe MR - not noted on last echo 5/23 - severe on echo 9/30 with degenerative valve   Acute renal failure - cardio-renal  - oliguric with maximal diuretics, now on CRRT - continue volume removal, see above - hope for renal recovery, she would not want outpatient HD   CAD - LHC in 2015 showed 99% RCA occlusion with collaterals - hs-trop 35>35, No ACS - continue atorva 80 mg daily  Length of Stay: 6  CRITICAL CARE Performed by: Toribio Fuel  Total critical care time: 42 minutes  Critical care time was exclusive of separately billable procedures and treating other patients.  Critical care  was necessary to treat or prevent imminent or life-threatening deterioration.  Critical care was time spent personally by me on the following activities: development of treatment plan with patient and/or surrogate as well as nursing, discussions with consultants, evaluation of patient's response to treatment, examination of patient, obtaining history from patient or surrogate, ordering and performing treatments and interventions, ordering and review of laboratory studies, ordering and review of radiographic studies, pulse oximetry and re-evaluation of patient's condition.   Toribio  Gaberial Cada, MD  06/23/2024, 9:13 PM  Advanced Heart Failure Team Pager [phone] (M-F; 7a - 5p)  Please contact [facility] for night-coverage after hours (5p -7a ) and weekends on [email or web address]

## 2024-06-23 NOTE — Progress Notes (Signed)
 PHARMACY - ANTICOAGULATION CONSULT NOTE  Pharmacy Consult:  Heparin  Indication: atrial fibrillation  Allergies  Allergen Reactions   Other Other (See Comments)    FLAGYL , CIPRO , PROTONIX  taken at the same time caused patient to lose her breath and have trouble breathing, requiring hospital stay at [facility], 03/23/14-per patient.    Fentanyl  Nausea Only    Room was spinning prefers not to take it   Lisinopril  Cough    Patient Measurements: Weight: 90.4 kg (199 lb 4.7 oz) Heparin  dosing weight = 79 kg  Vital Signs: Temp: 99.5 F (37.5 C) (10/04 0700) BP: 90/72 (10/04 0700) Pulse Rate: 114 (10/04 0700)  Labs: Recent Labs    06/21/24 0437 06/21/24 1531 06/22/24 0425 06/22/24 1600 06/23/24 0507 06/23/24 0508  HGB 10.0*  --  9.8*  --  9.7*  --   HCT 28.8*  --  28.8*  --  29.1*  --   PLT 83*  --  80*  --  77*  --   APTT 101*  --  66*  --  65*  --   HEPARINUNFRC >1.10*  --  >1.10*  --   --  1.01*  CREATININE 1.44*   < > 1.25* 1.35*  --  1.29*   < > = values in this interval not displayed.    Estimated Creatinine Clearance: 41.9 mL/min (A) (by C-G formula based on SCr of 1.29 mg/dL (H)).   Medical History: Past Medical History:  Diagnosis Date   A-fib (HCC) 06/12/2024   Anemia    Arthritis    CAD (coronary artery disease)    a. LHC (03/2014) Lmain: nl, LAD: diff dz proximal 20%, 30-40% dz mid vessel prior 2nd diagonal, LCx: 40% in OM1, 20-30% in OM2, RCA: 99% subtotal occlusion @ crux, TIMI 1 flow, L-R collaterals to distal vessel   Cardiomyopathy (HCC) 03/28/2014   LV dysfunction out of proportion to CAD 03/28/14   CHF (congestive heart failure) (HCC)    Phreesia 10/03/2020   Chronic systolic heart failure (HCC)    a. ECHO (03/2014): EF 25-30%, akinesis enteroanteroseptal myocardium, grade III DD b. RHC (03/2014) RA 4, RV 42/4, PA 44/14 (26), PCWP 21, PA 62% Fick CO/CI 6.9 / 3.4   Diabetes mellitus    Diabetes mellitus without complication (HCC)    Phreesia 10/03/2020    Duodenal ulcer    Age 4   Dyspnea    Erosive esophagitis    Gastritis    Hypertension    Hypothyroidism    Obese    Short-term memory loss    Thyroid  disease    TTP (thrombotic thrombocytopenic purpura) (HCC) 06/2005     Assessment: 63 YOF with cardiogenic shock, now requiring CRRT.  Pharmacy consulted to transition from Eliquis  to IV heparin , last dose on 9/30 at 1001.  Will use aPTT to guide heparin  dosing until heparin  level and aPTT correlates.  06/23/24 AM: aPTT 65, subtherapeutic at heparin  950 units/hr. HL 1.01, remains elevated due to recent Eliquis  administration. No issues with infusion running noted. No further bleeding at HD catheter site per RN. CBC stable (Hgb 9.7, PLT 77).    Goal of Therapy:  Heparin  level 0.3-0.7 units/ml aPTT 66-102 seconds Monitor platelets by anticoagulation protocol: Yes   Plan:  Increase heparin  1050 units/hr Repeat heparin  level in 8 hours Daily heparin  level, aPTT and CBC Monitor for bleeding  Morna Breach, PharmD PGY2 Cardiology Pharmacy Resident 06/23/2024 7:07 AM

## 2024-06-23 NOTE — Progress Notes (Signed)
 PT Cancellation Note  Patient Details Name: [name] MRN: [id number] DOB: [birth year]   Cancelled Treatment:    Reason Eval/Treat Not Completed: Patient declined, no reason specified (pt reports fatigue and declines physical therapy at this time. Scheduled for cardioversion tomorrow. Nursing stating they will sit her EOB if she feels better later this evening. Will follow up as able and appropriate.)  [name], DPT, CLT  Acute Rehabilitation Services Office: [phone] (Secure chat preferred)   [name] 06/23/2024, 1:14 PM

## 2024-06-24 ENCOUNTER — Inpatient Hospital Stay (HOSPITAL_COMMUNITY)

## 2024-06-24 DIAGNOSIS — I340 Nonrheumatic mitral (valve) insufficiency: Secondary | ICD-10-CM

## 2024-06-24 DIAGNOSIS — I4891 Unspecified atrial fibrillation: Secondary | ICD-10-CM

## 2024-06-24 DIAGNOSIS — I361 Nonrheumatic tricuspid (valve) insufficiency: Secondary | ICD-10-CM | POA: Diagnosis not present

## 2024-06-24 DIAGNOSIS — I5023 Acute on chronic systolic (congestive) heart failure: Secondary | ICD-10-CM | POA: Diagnosis not present

## 2024-06-24 LAB — RENAL FUNCTION PANEL
Albumin: 2.9 g/dL — ABNORMAL LOW (ref 3.5–5.0)
Albumin: 2.8 g/dL — ABNORMAL LOW (ref 3.5–5.0)
Anion gap: 12 (ref 5–15)
Anion gap: 8 (ref 5–15)
BUN: 9 mg/dL (ref 8–23)
BUN: 8 mg/dL (ref 8–23)
CO2: 22 mmol/L (ref 22–32)
CO2: 25 mmol/L (ref 22–32)
Calcium: 8.1 mg/dL — ABNORMAL LOW (ref 8.9–10.3)
Calcium: 8.1 mg/dL — ABNORMAL LOW (ref 8.9–10.3)
Chloride: 97 mmol/L — ABNORMAL LOW (ref 98–111)
Chloride: 100 mmol/L (ref 98–111)
Creatinine, Ser: 1.36 mg/dL — ABNORMAL HIGH (ref 0.44–1.00)
Creatinine, Ser: 1.24 mg/dL — ABNORMAL HIGH (ref 0.44–1.00)
GFR, Estimated: 41 mL/min — ABNORMAL LOW (ref >60)
GFR, Estimated: 45 mL/min — ABNORMAL LOW (ref >60)
Glucose, Bld: 245 mg/dL — ABNORMAL HIGH (ref 70–99)
Glucose, Bld: 150 mg/dL — ABNORMAL HIGH (ref 70–99)
Phosphorus: 2.2 mg/dL — ABNORMAL LOW (ref 2.5–4.6)
Phosphorus: 2.7 mg/dL (ref 2.5–4.6)
Potassium: 4.3 mmol/L (ref 3.5–5.1)
Potassium: 4.2 mmol/L (ref 3.5–5.1)
Sodium: 131 mmol/L — ABNORMAL LOW (ref 135–145)
Sodium: 133 mmol/L — ABNORMAL LOW (ref 135–145)

## 2024-06-24 LAB — COOXEMETRY PANEL
Carboxyhemoglobin: 1.5 % (ref 0.5–1.5)
Methemoglobin: <0.7 % (ref 0.0–1.5)
O2 Saturation: 58.6 %
Total hemoglobin: 10.2 g/dL — ABNORMAL LOW (ref 12.0–16.0)

## 2024-06-24 LAB — GLUCOSE, CAPILLARY
Glucose-Capillary: 212 mg/dL — ABNORMAL HIGH (ref 70–99)
Glucose-Capillary: 217 mg/dL — ABNORMAL HIGH (ref 70–99)
Glucose-Capillary: 164 mg/dL — ABNORMAL HIGH (ref 70–99)
Glucose-Capillary: 140 mg/dL — ABNORMAL HIGH (ref 70–99)
Glucose-Capillary: 186 mg/dL — ABNORMAL HIGH (ref 70–99)

## 2024-06-24 LAB — CBC
HCT: 28.8 % — ABNORMAL LOW (ref 36.0–46.0)
Hemoglobin: 9.7 g/dL — ABNORMAL LOW (ref 12.0–15.0)
MCH: 28.2 pg (ref 26.0–34.0)
MCHC: 33.7 g/dL (ref 30.0–36.0)
MCV: 83.7 fL (ref 80.0–100.0)
Platelets: 84 K/uL — ABNORMAL LOW (ref 150–400)
RBC: 3.44 MIL/uL — ABNORMAL LOW (ref 3.87–5.11)
RDW: 20.5 % — ABNORMAL HIGH (ref 11.5–15.5)
WBC: 7.4 K/uL (ref 4.0–10.5)
nRBC: 0.3 % — ABNORMAL HIGH (ref 0.0–0.2)

## 2024-06-24 LAB — HEPARIN LEVEL (UNFRACTIONATED): Heparin Unfractionated: 0.76 [IU]/mL — ABNORMAL HIGH (ref 0.30–0.70)

## 2024-06-24 LAB — APTT: aPTT: 87 s — ABNORMAL HIGH (ref 24–36)

## 2024-06-24 LAB — MAGNESIUM: Magnesium: 2.2 mg/dL (ref 1.7–2.4)

## 2024-06-24 MED ORDER — HYDROMORPHONE HCL 1 MG/ML IJ SOLN
2.0000 mg | Freq: Once | INTRAMUSCULAR | Status: AC | PRN
  Administered 2024-06-24: 1 mg via INTRAVENOUS
  Filled 2024-06-24: qty 2

## 2024-06-24 MED ORDER — IPRATROPIUM-ALBUTEROL 0.5-2.5 (3) MG/3ML IN SOLN: 3.0000 mL | Freq: Once | RESPIRATORY_TRACT | Status: AC

## 2024-06-24 MED ORDER — PROPOFOL 10 MG/ML IV BOLUS
100.0000 mg | Freq: Once | INTRAVENOUS | Status: AC | PRN
  Administered 2024-06-24: 70 mg via INTRAVENOUS
  Filled 2024-06-24: qty 20

## 2024-06-24 MED ORDER — IPRATROPIUM-ALBUTEROL 0.5-2.5 (3) MG/3ML IN SOLN
RESPIRATORY_TRACT | Status: AC
  Administered 2024-06-24: 3 mL via RESPIRATORY_TRACT
  Filled 2024-06-24: qty 3

## 2024-06-24 MED ORDER — SODIUM PHOSPHATES 45 MMOLE/15ML IV SOLN
15.0000 mmol | Freq: Once | INTRAVENOUS | Status: AC
  Administered 2024-06-24: 15 mmol via INTRAVENOUS
  Filled 2024-06-24: qty 5

## 2024-06-24 NOTE — Progress Notes (Signed)
 06/24/2024 Critical Care Medicine Pre Moderate Sedation Evaluation  [name]  Procedure needing sedation: TEE with cardioversion  Relevant medical and surgical history reviewed Current meds reviewed  Exam ENT: malampatti 2, trachea midline LUNGS: nonlabored, diminished bases HEART: tachycardic, irregular, ext warm  MODERATE SEDATION MONITORING Bedside direct observation by PCCM provider Telemetry Pulse oximetry Q35min BP checks EtCO2: yes  [name] BROCKS, MD [facility] Pulmonary Critical Care Medicine

## 2024-06-24 NOTE — Progress Notes (Signed)
  Echocardiogram Echocardiogram Transesophageal has been performed.  [name] 06/24/2024, 11:33 AM

## 2024-06-24 NOTE — Progress Notes (Signed)
 PHARMACY - ANTICOAGULATION CONSULT NOTE  Pharmacy Consult:  Heparin  Indication: atrial fibrillation  Allergies  Allergen Reactions   Other Other (See Comments)    FLAGYL , CIPRO , PROTONIX  taken at the same time caused patient to lose her breath and have trouble breathing, requiring hospital stay at [facility], 03/23/14-per patient.    Fentanyl  Nausea Only    Room was spinning prefers not to take it   Lisinopril  Cough    Patient Measurements: Weight: 90.4 kg (199 lb 4.7 oz) Heparin  dosing weight = 79 kg  Vital Signs: Temp: 99.3 F (37.4 C) (10/05 0830) Temp Source: Core (10/05 0800) BP: 99/58 (10/05 0830) Pulse Rate: 109 (10/05 0830)  Labs: Recent Labs    06/22/24 0425 06/22/24 1600 06/23/24 0507 06/23/24 0508 06/23/24 1628 06/23/24 1739 06/24/24 0400  HGB 9.8*  --  9.7*  --   --   --  9.7*  HCT 28.8*  --  29.1*  --   --   --  28.8*  PLT 80*  --  77*  --   --   --  84*  APTT 66*  --  65*  --   --  80* 87*  HEPARINUNFRC >1.10*  --   --  1.01*  --   --  0.76*  CREATININE 1.25*   < >  --  1.29* 1.38*  --  1.36*   < > = values in this interval not displayed.    Estimated Creatinine Clearance: 39.7 mL/min (A) (by C-G formula based on SCr of 1.36 mg/dL (H)).   Medical History: Past Medical History:  Diagnosis Date   A-fib (HCC) 06/12/2024   Anemia    Arthritis    CAD (coronary artery disease)    a. LHC (03/2014) Lmain: nl, LAD: diff dz proximal 20%, 30-40% dz mid vessel prior 2nd diagonal, LCx: 40% in OM1, 20-30% in OM2, RCA: 99% subtotal occlusion @ crux, TIMI 1 flow, L-R collaterals to distal vessel   Cardiomyopathy (HCC) 03/28/2014   LV dysfunction out of proportion to CAD 03/28/14   CHF (congestive heart failure) (HCC)    Phreesia 10/03/2020   Chronic systolic heart failure (HCC)    a. ECHO (03/2014): EF 25-30%, akinesis enteroanteroseptal myocardium, grade III DD b. RHC (03/2014) RA 4, RV 42/4, PA 44/14 (26), PCWP 21, PA 62% Fick CO/CI 6.9 / 3.4   Diabetes mellitus     Diabetes mellitus without complication (HCC)    Phreesia 10/03/2020   Duodenal ulcer    Age 73   Dyspnea    Erosive esophagitis    Gastritis    Hypertension    Hypothyroidism    Obese    Short-term memory loss    Thyroid  disease    TTP (thrombotic thrombocytopenic purpura) (HCC) 06/2005     Assessment: 21 YOF with cardiogenic shock, now requiring CRRT.  Pharmacy consulted to transition from Eliquis  to IV heparin , last dose on 9/30 at 1001.  Will use aPTT to guide heparin  dosing until heparin  level and aPTT correlates.  06/24/24 AM: aPTT 87, therapeutic at heparin  1050 units/hr. HL 0.76, trending down to therapeutic range. Since aPTT and heparin  level now correlating, will utilize heparin  level for dosing. No issues with infusion running noted. No further bleeding at HD catheter site per RN. CBC stable (Hgb 9.7, PLT 84). Plan for TEE/DCCV today.   2nd shift update - aPTT therapeutic at 80 after recent dose increase.   Goal of Therapy:  Heparin  level 0.3-0.7 units/ml aPTT 66-102 seconds Monitor platelets  by anticoagulation protocol: Yes   Plan:  Continue heparin  1050 units/hr Daily heparin  level, CBC, and s/sx of bleeding daily F/u ability to transition to Eliquis   Morna Breach, PharmD PGY2 Cardiology Pharmacy Resident 06/24/2024 8:49 AM

## 2024-06-24 NOTE — Progress Notes (Signed)
 Nephrology Follow-Up Consult note   Assessment/Recommendations: [name] is a/an 73 y.o. female with a past medical history significant for systolic & diastolic heart failure s/p BiV ICD, CAD, OSA on BiPAP, LBBB, HTN, new Afib who presents to with Chest pain, SOB, cough.     Anuric AKI (not improving): Likely secondary to new RV heart failure, persistent atrial fibrillation, Cardiorenal Syndrome  -Did not produce significant urine output despite high-dose diuretics.  RHC with concern for volume overload.  Remains on pressor support Continue CRRT: 9/30-now  -Chart reviewed: (medications acceptable, does not appear to have been exposed to nephrotoxins with imaging or had episodes of significant hypotension).   -Continue to monitor daily Cr, Dose meds for GFR<15 -Monitor Daily I/Os, Daily weight  -Maintain MAP>65 for optimal renal perfusion.  -Agree with holding ACE-I, avoid further nephrotoxins including NSAIDS, Morphine .  Unless absolutely necessary, avoid CT with contrast and/or MRI with gadolinium.        Volume Status: Appears volume overloaded but significantly improved on exam. Based on our examination and review of available imaging, our recommendation is CRRT as above.   Electrolytes  Hyponatremia: Mild, stable.  Likely due to volume overload state of heart failure.  Should improve with CRRT  Hypophosphatemia: Due to CRRT.  On replacement protocol.   Metabolic acidosis: Resolved on RRT.  Likely in the setting of worsening renal function.   AoC BiV HF; New onset RV failure: On milrinone, for output with high-dose diuretics.  CRRT as above.   Atrial fibrillation: On amiodarone drip.  Pending cardioversion   CAD: On atorvastatin    Recommendations conveyed to primary service.    Evalene HERO [facility] 06/24/2024 7:34 AM  ___________________________________________________________  CC: Chest pain  Interval History/Subjective: No major events  overnight.  Tachycardic, otherwise vital signs fairly stable.  On amiodarone, epinephrine, milrinone, norepinephrine , vasopressin.  UOP 0.  CRRT running even, no issues, per nurse.  Pending TEE with cardioversion. Son at bedside.  All questions answered.    Medications:  Current Facility-Administered Medications  Medication Dose Route Frequency Provider Last Rate Last Admin   acetaminophen  (TYLENOL ) tablet 650 mg  650 mg Oral Q6H PRN Patel, Vishal R, MD   650 mg at 06/22/24 0008   Or   acetaminophen  (TYLENOL ) suppository 650 mg  650 mg Rectal Q6H PRN Patel, Vishal R, MD       albuterol  (PROVENTIL ) (2.5 MG/3ML) 0.083% nebulizer solution 2.5 mg  2.5 mg Nebulization Q6H PRN Patel, Vishal R, MD   2.5 mg at 06/18/24 0143   amiodarone (NEXTERONE PREMIX) 360-4.14 MG/200ML-% (1.8 mg/mL) IV infusion  60 mg/hr Intravenous Continuous Lee, [place], NP 33.3 mL/hr at 06/24/24 0700 60 mg/hr at 06/24/24 0700   atorvastatin  (LIPITOR ) tablet 80 mg  80 mg Oral Daily Patel, Vishal R, MD   80 mg at 06/23/24 0931   bisacodyl (DULCOLAX) EC tablet 5 mg  5 mg Oral Daily PRN Patel, Vishal R, MD       calcium  carbonate (TUMS - dosed in mg elemental calcium ) chewable tablet 200 mg of elemental calcium   200 mg of elemental calcium  Oral TID PRN Bensimhon, Daniel R, MD   200 mg of elemental calcium  at 06/22/24 0947   Chlorhexidine  Gluconate Cloth 2 % PADS 6 each  6 each Topical Daily Lee, [place], NP   6 each at 06/23/24 0929   EPINEPHrine (ADRENALIN) 10 mg in sodium chloride  0.9 % 250 mL (0.04 mg/mL) infusion  1 mcg/min Intravenous Continuous Bensimhon, Toribio  R, MD 1.5 mL/hr at 06/24/24 0700 1 mcg/min at 06/24/24 0700   feeding supplement (ENSURE PLUS HIGH PROTEIN) liquid 237 mL  237 mL Oral TID BM Bensimhon, Toribio SAUNDERS, MD   237 mL at 06/23/24 1950   heparin  ADULT infusion 100 units/mL (25000 units/250mL)  1,050 Units/hr Intravenous Continuous Katsaros, Lindsey R, RPH 10.5 mL/hr at 06/24/24 0700 1,050 Units/hr at 06/24/24 0700    heparin  injection 1,000-6,000 Units  1,000-6,000 Units CRRT PRN Windle Evalene HERO, DO   2,800 Units at 06/19/24 1711   HYDROmorphone  (DILAUDID ) injection 2 mg  2 mg Intravenous Once PRN Smith, Daniel C, MD       insulin  aspart (novoLOG ) injection 0-20 Units  0-20 Units Subcutaneous TID WC Colletta Manuelita Garre, PA-C   4 Units at 06/23/24 1621   insulin  aspart (novoLOG ) injection 0-5 Units  0-5 Units Subcutaneous QHS Colletta Manuelita Garre, PA-C   2 Units at 06/22/24 2100   levothyroxine  (SYNTHROID ) tablet 50 mcg  50 mcg Oral Q0600 Patel, Vishal R, MD   50 mcg at 06/24/24 0602   loperamide (IMODIUM) capsule 2 mg  2 mg Oral Q6H PRN Colletta Manuelita Garre, PA-C   2 mg at 06/22/24 1054   milrinone (PRIMACOR) 20 MG/100 ML (0.2 mg/mL) infusion  0.375 mcg/kg/min Intravenous Continuous Colletta Manuelita Garre, PA-C 10.53 mL/hr at 06/24/24 0700 0.375 mcg/kg/min at 06/24/24 0700   multivitamin (RENA-VIT) tablet 1 tablet  1 tablet Oral QHS Bensimhon, Toribio SAUNDERS, MD   1 tablet at 06/23/24 2136   norepinephrine  (LEVOPHED ) 16 mg in 250mL (0.064 mg/mL) premix infusion  0-20 mcg/min Intravenous Titrated Colletta Manuelita Garre, PA-C 18.75 mL/hr at 06/24/24 0700 20 mcg/min at 06/24/24 0700   ondansetron  (ZOFRAN ) tablet 4 mg  4 mg Oral Q6H PRN Patel, Vishal R, MD       Or   ondansetron  (ZOFRAN ) injection 4 mg  4 mg Intravenous Q6H PRN Patel, Vishal R, MD   4 mg at 06/22/24 0602   Oral care mouth rinse  15 mL Mouth Rinse PRN Bensimhon, Toribio SAUNDERS, MD       prismasol BGK 4/2.5 infusion   CRRT Continuous Windle Evalene HERO, DO 400 mL/hr at 06/24/24 0636 New Bag at 06/24/24 0636   prismasol BGK 4/2.5 infusion   CRRT Continuous Ross Bender M, DO 400 mL/hr at 06/24/24 0636 New Bag at 06/24/24 0636   prismasol BGK 4/2.5 infusion   CRRT Continuous Windle Evalene HERO, DO 1,500 mL/hr at 06/24/24 0636 New Bag at 06/24/24 0636   propofol  (DIPRIVAN ) 10 mg/mL bolus/IV push 100 mg  100 mg Intravenous Once PRN [name] BROCKS, MD        scopolamine  (TRANSDERM-SCOP) 1 MG/3DAYS 1 mg  1 patch Transdermal Q72H Sherryll Suzen SQUIBB, RPH   1 mg at 06/23/24 9068   senna-docusate (Senokot-S) tablet 1 tablet  1 tablet Oral QHS PRN Tobie Jorie SAUNDERS, MD       sodium chloride  0.9 % primer fluid for CRRT   CRRT PRN Levenia Skalicky M, DO       sodium chloride  flush (NS) 0.9 % injection 10-40 mL  10-40 mL Intracatheter Q12H Bensimhon, Toribio SAUNDERS, MD   10 mL at 06/23/24 2140   sodium chloride  flush (NS) 0.9 % injection 10-40 mL  10-40 mL Intracatheter PRN Bensimhon, Toribio SAUNDERS, MD       sodium chloride  flush (NS) 0.9 % injection 3 mL  3 mL Intravenous Q12H Tobie Jorie SAUNDERS, MD   3 mL at 06/23/24  2140   sodium chloride  flush (NS) 0.9 % injection 3 mL  3 mL Intravenous Q12H Lee, [place], NP   3 mL at 06/23/24 2140   sodium chloride  flush (NS) 0.9 % injection 3 mL  3 mL Intravenous PRN Lee, [place], NP       sodium phosphate 15 mmol in sodium chloride  0.9 % 250 mL infusion  15 mmol Intravenous Once Serena Morna SAUNDERS, RPH       trimethobenzamide PHYLLISTINE) injection 200 mg  200 mg Intramuscular Q6H PRN Lee, [place], NP       vasopressin (PITRESSIN) 20 Units in 100 mL (0.2 unit/mL) infusion-*FOR SHOCK*  0-0.03 Units/min Intravenous Continuous Lee, [place], NP 9 mL/hr at 06/24/24 0700 0.03 Units/min at 06/24/24 0700      Review of Systems: 10 systems reviewed and negative except per interval history/subjective  Physical Exam: Vitals:   06/24/24 0645 06/24/24 0700  BP: 96/76 94/63  Pulse: (!) 119 (!) 109  Resp: (!) 22 (!) 22  Temp: 99.3 F (37.4 C) 99.5 F (37.5 C)  SpO2: 94% 95%   No intake/output data recorded.  Intake/Output Summary (Last 24 hours) at 06/24/2024 0734 Last data filed at 06/24/2024 0700 Gross per 24 hour  Intake 2848.54 ml  Output 4088.3 ml  Net -1239.76 ml   General: well-appearing, no acute distress, on 3LNC CV: Tachycardic, 1-2+ peripheral edema Lungs: Bilateral breath, normal work of breathing Abd: soft, non-tender,  non-distended Skin: no visible lesions or rashes Psych: Alert Musculoskeletal: no obvious deformities LIJ  Test Results I personally reviewed new and old clinical labs and radiology tests Lab Results  Component Value Date   NA 131 (L) 06/24/2024   K 4.3 06/24/2024   CL 97 (L) 06/24/2024   CO2 22 06/24/2024   BUN 9 06/24/2024   CREATININE 1.36 (H) 06/24/2024   CALCIUM  8.1 (L) 06/24/2024   ALBUMIN 2.9 (L) 06/24/2024   PHOS 2.2 (L) 06/24/2024    CBC Recent Labs  Lab 06/22/24 0425 06/23/24 0507 06/24/24 0400  WBC 6.1 7.8 7.4  HGB 9.8* 9.7* 9.7*  HCT 28.8* 29.1* 28.8*  MCV 83.0 84.3 83.7  PLT 80* 77* 84*

## 2024-06-24 NOTE — Progress Notes (Signed)
 PHARMACY - ANTICOAGULATION CONSULT NOTE  Pharmacy Consult:  Heparin  Indication: atrial fibrillation  Allergies  Allergen Reactions   Other Other (See Comments)    FLAGYL , CIPRO , PROTONIX  taken at the same time caused patient to lose her breath and have trouble breathing, requiring hospital stay at [facility], 03/23/14-per patient.    Fentanyl  Nausea Only    Room was spinning prefers not to take it   Lisinopril  Cough    Patient Measurements: Weight: 90.4 kg (199 lb 4.7 oz) Heparin  dosing weight = 79 kg  Vital Signs: Temp: 99.3 F (37.4 C) (10/05 0345) BP: 99/59 (10/05 0345) Pulse Rate: 108 (10/05 0345)  Labs: Recent Labs    06/22/24 0425 06/22/24 1600 06/23/24 0507 06/23/24 0508 06/23/24 1628 06/23/24 1739 06/24/24 0400  HGB 9.8*  --  9.7*  --   --   --  9.7*  HCT 28.8*  --  29.1*  --   --   --  28.8*  PLT 80*  --  77*  --   --   --  84*  APTT 66*  --  65*  --   --  80* 87*  HEPARINUNFRC >1.10*  --   --  1.01*  --   --  0.76*  CREATININE 1.25*   < >  --  1.29* 1.38*  --  1.36*   < > = values in this interval not displayed.    Estimated Creatinine Clearance: 39.7 mL/min (A) (by C-G formula based on SCr of 1.36 mg/dL (H)).   Medical History: Past Medical History:  Diagnosis Date   A-fib (HCC) 06/12/2024   Anemia    Arthritis    CAD (coronary artery disease)    a. LHC (03/2014) Lmain: nl, LAD: diff dz proximal 20%, 30-40% dz mid vessel prior 2nd diagonal, LCx: 40% in OM1, 20-30% in OM2, RCA: 99% subtotal occlusion @ crux, TIMI 1 flow, L-R collaterals to distal vessel   Cardiomyopathy (HCC) 03/28/2014   LV dysfunction out of proportion to CAD 03/28/14   CHF (congestive heart failure) (HCC)    Phreesia 10/03/2020   Chronic systolic heart failure (HCC)    a. ECHO (03/2014): EF 25-30%, akinesis enteroanteroseptal myocardium, grade III DD b. RHC (03/2014) RA 4, RV 42/4, PA 44/14 (26), PCWP 21, PA 62% Fick CO/CI 6.9 / 3.4   Diabetes mellitus    Diabetes mellitus without  complication (HCC)    Phreesia 10/03/2020   Duodenal ulcer    Age 73   Dyspnea    Erosive esophagitis    Gastritis    Hypertension    Hypothyroidism    Obese    Short-term memory loss    Thyroid  disease    TTP (thrombotic thrombocytopenic purpura) (HCC) 06/2005     Assessment: 3 YOF with cardiogenic shock, now requiring CRRT.  Pharmacy consulted to transition from Eliquis  to IV heparin , last dose on 9/30 at 1001.  Will use aPTT to guide heparin  dosing until heparin  level and aPTT correlates.  06/23/24 AM: aPTT 65, subtherapeutic at heparin  950 units/hr. HL 1.01, remains elevated due to recent Eliquis  administration. No issues with infusion running noted. No further bleeding at HD catheter site per RN. CBC stable (Hgb 9.7, PLT 77).    10/5 AM update:  aPTT therapeutic x 2 Hgb stable  Goal of Therapy:  Heparin  level 0.3-0.7 units/ml aPTT 66-102 seconds Monitor platelets by anticoagulation protocol: Yes   Plan:  Continue heparin  1050 units/hr Daily heparin  level, aPTT and CBC Monitor for bleeding  Lynwood Mckusick, PharmD, BCPS Clinical Pharmacist Phone: [phone]

## 2024-06-24 NOTE — CV Procedure (Signed)
   TRANSESOPHAGEAL ECHOCARDIOGRAM GUIDED DIRECT CURRENT CARDIOVERSION  NAME:  [name]   MRN: [id number] DOB:  March 08, 1951   ADMIT DATE: 06/17/2024  INDICATIONS:  Atrial fibrillation  PROCEDURE:   Informed consent was obtained prior to the procedure. The risks, benefits and alternatives for the procedure were discussed and the patient comprehended these risks.  Risks include, but are not limited to, cough, sore throat, vomiting, nausea, somnolence, esophageal and stomach trauma or perforation, bleeding, low blood pressure, aspiration, pneumonia, infection, trauma to the teeth and death.    After a procedural time-out, the oropharynx was anesthetized and the patient was sedated by the CCM service. The transesophageal probe was inserted in the esophagus and stomach without difficulty and multiple views were obtained.   FINDINGS:  LEFT VENTRICLE: EF = < 20% with global HK  RIGHT VENTRICLE: Severe HK + ICD  LEFT ATRIUM: Moderately dilated  LEFT ATRIAL APPENDAGE: No clot  RIGHT ATRIUM: Normal  AORTIC VALVE:  Trileaflet. No AI/AS  MITRAL VALVE:    Moderate MR  TRICUSPID VALVE: mild to mod TR  PULMONIC VALVE: mild PR  INTERATRIAL SEPTUM: No PFO/ASD  PERICARDIUM: No effusion  DESCENDING AORTA: Minimal plaque   CARDIOVERSION:     Indications:  Atrial Fibrillation  Procedure Details:  Once the TEE was complete, the patient had the defibrillator pads placed in the anterior and posterior position. Once an appropriate level of sedation was achieved, the patient received a single biphasic, synchronized 200J shock with prompt conversion to sinus rhythm. No apparent complications.   Toribio Fuel, MD  11:31 AM

## 2024-06-24 NOTE — Procedures (Signed)
 06/24/2024 [name]   Procedure: Moderate Sedation (CPT [phone]) for TEE with cardioversion  The patient received 70mg  propofol  and 1 mg dilaudid  and dosages were recorded on the EMR until appropriate level of moderate sedation reached.  Monitoring performed as planned in the pre-sedation evaluation.  The patient was recovered from the sedation requiring brief bagging due to apnea. Patient returned to pre-sedation level of awareness. The monitoring was discontinued at this time.  Post-anesthesia evaluation:  Consciousness and pulmonary status returned to pre-anesthetic state.  [name] BROCKS, MD

## 2024-06-24 NOTE — Progress Notes (Signed)
 OT Cancellation Note  Patient Details Name: [name] MRN: [id number] DOB: Jan 29, 1951   Cancelled Treatment:    Reason Eval/Treat Not Completed: Patient declined, no reason specified (fatigued from procedure earlier today, OT to follow up tmw for evaluation)  Oris Calmes K, OTD, OTR/L SecureChat Preferred Acute Rehab [phone]   [name] 06/24/2024, 2:01 PM

## 2024-06-24 NOTE — Progress Notes (Signed)
 Advanced Heart Failure Rounding Note  Cardiologist: None   Chief Complaint: CHF  Subjective:    RHC 9/30: RA 17, PA 63/35 (46), PCW 40, Fick 5.7/2.8, TD 3.7/1.8, PVR 1.6, PAPi 1.6 + HDC placed  Remains on milrinone, NE and VP.  Epi at 1 added yesterday for low output  PAP: (45-72)/(11-31) 56/21 CVP:  [8 mmHg-24 mmHg] 12 mmHg PCWP:  [22 mmHg] 22 mmHg CO:  [2.8 L/min-3.7 L/min] 3.7 L/min CI:  [1.4 L/min/m2-1.8 L/min/m2] 1.79 L/min/m2  Remains anuric. On CVVHD pulling 100-150/hr  Remains in AF with RVR on IV amio and heparin   Feels weak. Denies SOB    Objective:    Weight Range: 87.7 kg Body mass index is 32.17 kg/m.   Vital Signs:   Temp:  [99 F (37.2 C)-99.5 F (37.5 C)] 99.3 F (37.4 C) (10/05 1051) Pulse Rate:  [89-124] 114 (10/05 1051) Resp:  [14-26] 22 (10/05 1051) BP: (76-110)/(42-88) 96/81 (10/05 1045) SpO2:  [76 %-100 %] 98 % (10/05 1051) Weight:  [87.7 kg] 87.7 kg (10/05 0701) Last BM Date : 06/23/24  Weight change: Filed Weights   06/22/24 0600 06/23/24 0500 06/24/24 0701  Weight: 91.1 kg 90.4 kg 87.7 kg   Intake/Output:  Intake/Output Summary (Last 24 hours) at 06/24/2024 1127 Last data filed at 06/24/2024 1000 Gross per 24 hour  Intake 2455.85 ml  Output 3224.6 ml  Net -768.75 ml    Physical Exam    General:  Weak appearing. No resp difficulty HEENT: normal Neck: supple. RIJ swan L internal jugular HD cath  Carotids 2+ bilat; no bruits. No lymphadenopathy or thryomegaly appreciated. Cor: irreg  Lungs: clear Abdomen: soft, nontender, nondistended. No hepatosplenomegaly. No bruits or masses. Good bowel sounds. Extremities: no cyanosis, clubbing, rash, tr edema Neuro: alert & orientedx3, cranial nerves grossly intact. moves all 4 extremities w/o difficulty. Affect pleasant    Telemetry    AF 100-120  Personally reviewed   Labs    CBC Recent Labs    06/23/24 0507 06/24/24 0400  WBC 7.8 7.4  HGB 9.7* 9.7*  HCT 29.1* 28.8*   MCV 84.3 83.7  PLT 77* 84*   Basic Metabolic Panel Recent Labs    89/95/74 0507 06/23/24 0508 06/23/24 1628 06/24/24 0400  NA  --    < > 131* 131*  K  --    < > 4.5 4.3  CL  --    < > 99 97*  CO2  --    < > 24 22  GLUCOSE  --    < > 190* 245*  BUN  --    < > 9 9  CREATININE  --    < > 1.38* 1.36*  CALCIUM   --    < > 8.0* 8.1*  MG 2.2  --   --  2.2  PHOS  --    < > 2.6 2.2*   < > = values in this interval not displayed.   Liver Function Tests Recent Labs    06/23/24 1628 06/24/24 0400  ALBUMIN 2.9* 2.9*   BNP (last 3 results) Recent Labs    06/17/24 1813  BNP 1,998.5*   Medications:    Scheduled Medications:  atorvastatin   80 mg Oral Daily   Chlorhexidine  Gluconate Cloth  6 each Topical Daily   feeding supplement  237 mL Oral TID BM   insulin  aspart  0-20 Units Subcutaneous TID WC   insulin  aspart  0-5 Units Subcutaneous QHS   levothyroxine   50 mcg Oral Q0600   multivitamin  1 tablet Oral QHS   scopolamine   1 patch Transdermal Q72H   sodium chloride  flush  10-40 mL Intracatheter Q12H   sodium chloride  flush  3 mL Intravenous Q12H   sodium chloride  flush  3 mL Intravenous Q12H    Infusions:  amiodarone 60 mg/hr (06/24/24 1000)   epinephrine 1 mcg/min (06/24/24 1000)   heparin  1,050 Units/hr (06/24/24 1000)   milrinone 0.375 mcg/kg/min (06/24/24 1038)   norepinephrine  (LEVOPHED ) Adult infusion 20 mcg/min (06/24/24 1000)   prismasol BGK 4/2.5 400 mL/hr at 06/24/24 0849   prismasol BGK 4/2.5 400 mL/hr at 06/24/24 0636   prismasol BGK 4/2.5 1,500 mL/hr at 06/24/24 1031   sodium PHOSPHATE IVPB (in mmol) 43 mL/hr at 06/24/24 1000   vasopressin 0.03 Units/min (06/24/24 1000)    PRN Medications: acetaminophen  **OR** acetaminophen , albuterol , bisacodyl, calcium  carbonate, heparin , loperamide, ondansetron  **OR** ondansetron  (ZOFRAN ) IV, mouth rinse, propofol , senna-docusate, sodium chloride , sodium chloride  flush, sodium chloride  flush,  trimethobenzamide  Patient Profile   [name] is a 73 y.o.  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   Admit with acute on chronic biventricular HF in setting of AF c/b acute renal failure.  Assessment/Plan   Acute on Chronic Biventricular Heart Failure  New onset RV Failure -> cardiogenic shock - mixed ischemic, nonischemic, EF initially down to 25-30% in 2015, LV dysfunction felt to be out of proportion to CAD. EF has been stable in 30-35% - LA normal in ED, diuresis has been limited by hypotension and worsening renal function concerning for low ouput - would favor getting back to NSR, suspect that decompensation related to atrial fibrillation - echo 9/30: EF 20-25%, mod LVH, D-shaped septum, RV mod reduced, degenerative MV w/ severe MR - RHC 9/30: RA 17, PA 63/35 (46), PCW 40, Fick 5.7/2.8, TD 3.7/1.8, PVR 1.6, PAPi 1.6  - Continues to deteriorate with worsening HF/shock - Hemodynamics remain poor despite addition of epi  - Remains anuric and volume overloaded. Continue CVVHD - Not candidate for advanced therapies and would not want HD - She is end-stage.  - I discussed options with her and her son  1. Continue  current course but unlikely to make much progress 2. Switch to comfort care 3. TEE/DC-CV to get her in NSR and try to help wean inotropes but may not help kidneys - They would like #3. Will proceed - Consult Palliative Care  Persistent Atrial Fibrillation - new onset 8/25 - continue heparin  gtt - continue amio 60 mg/hr, rate 100s - TEE/DC-CV today  Severe MR - not noted on last echo 5/23 - severe on echo 9/30 with degenerative valve   Acute renal failure - cardio-renal  - oliguric with maximal diuretics, now on CRRT - continue volume removal, see above - hope for renal recovery, she would not want outpatient HD - no change   CAD - LHC in 2015  showed 99% RCA occlusion with collaterals - hs-trop 35>35, No ACS - continue atorva 80 mg daily  Length of Stay: 7  CRITICAL CARE Performed by: Toribio Fuel  Total critical care time: 42 minutes  Critical care time was exclusive of separately billable procedures and treating other patients.  Critical care was necessary to treat or prevent imminent or life-threatening deterioration.  Critical care was time spent personally by me on the following activities: development of treatment plan  with patient and/or surrogate as well as nursing, discussions with consultants, evaluation of patient's response to treatment, examination of patient, obtaining history from patient or surrogate, ordering and performing treatments and interventions, ordering and review of laboratory studies, ordering and review of radiographic studies, pulse oximetry and re-evaluation of patient's condition.   Toribio Fuel, MD  06/24/2024, 11:27 AM  Advanced Heart Failure Team Pager [phone] (M-F; 7a - 5p)  Please contact [facility] for night-coverage after hours (5p -7a ) and weekends on [email or web address]

## 2024-06-25 ENCOUNTER — Encounter (HOSPITAL_COMMUNITY): Payer: Self-pay | Admitting: Pulmonary Disease

## 2024-06-25 DIAGNOSIS — N179 Acute kidney failure, unspecified: Secondary | ICD-10-CM | POA: Diagnosis not present

## 2024-06-25 DIAGNOSIS — E871 Hypo-osmolality and hyponatremia: Secondary | ICD-10-CM | POA: Diagnosis not present

## 2024-06-25 DIAGNOSIS — I5023 Acute on chronic systolic (congestive) heart failure: Secondary | ICD-10-CM | POA: Diagnosis not present

## 2024-06-25 DIAGNOSIS — E8779 Other fluid overload: Secondary | ICD-10-CM | POA: Diagnosis not present

## 2024-06-25 DIAGNOSIS — Z515 Encounter for palliative care: Secondary | ICD-10-CM

## 2024-06-25 DIAGNOSIS — I10 Essential (primary) hypertension: Secondary | ICD-10-CM | POA: Diagnosis not present

## 2024-06-25 LAB — RENAL FUNCTION PANEL
Albumin: 2.7 g/dL — ABNORMAL LOW (ref 3.5–5.0)
Albumin: 2.8 g/dL — ABNORMAL LOW (ref 3.5–5.0)
Anion gap: 11 (ref 5–15)
Anion gap: 8 (ref 5–15)
BUN: 9 mg/dL (ref 8–23)
BUN: 11 mg/dL (ref 8–23)
CO2: 22 mmol/L (ref 22–32)
CO2: 25 mmol/L (ref 22–32)
Calcium: 8.1 mg/dL — ABNORMAL LOW (ref 8.9–10.3)
Calcium: 8 mg/dL — ABNORMAL LOW (ref 8.9–10.3)
Chloride: 98 mmol/L (ref 98–111)
Chloride: 101 mmol/L (ref 98–111)
Creatinine, Ser: 1.36 mg/dL — ABNORMAL HIGH (ref 0.44–1.00)
Creatinine, Ser: 1.36 mg/dL — ABNORMAL HIGH (ref 0.44–1.00)
GFR, Estimated: 41 mL/min — ABNORMAL LOW (ref >60)
GFR, Estimated: 41 mL/min — ABNORMAL LOW (ref >60)
Glucose, Bld: 251 mg/dL — ABNORMAL HIGH (ref 70–99)
Glucose, Bld: 175 mg/dL — ABNORMAL HIGH (ref 70–99)
Phosphorus: 2.1 mg/dL — ABNORMAL LOW (ref 2.5–4.6)
Phosphorus: 2 mg/dL — ABNORMAL LOW (ref 2.5–4.6)
Potassium: 4.3 mmol/L (ref 3.5–5.1)
Potassium: 4.1 mmol/L (ref 3.5–5.1)
Sodium: 131 mmol/L — ABNORMAL LOW (ref 135–145)
Sodium: 134 mmol/L — ABNORMAL LOW (ref 135–145)

## 2024-06-25 LAB — HEPARIN LEVEL (UNFRACTIONATED): Heparin Unfractionated: 0.5 [IU]/mL (ref 0.30–0.70)

## 2024-06-25 LAB — GLUCOSE, CAPILLARY
Glucose-Capillary: 242 mg/dL — ABNORMAL HIGH (ref 70–99)
Glucose-Capillary: 302 mg/dL — ABNORMAL HIGH (ref 70–99)
Glucose-Capillary: 212 mg/dL — ABNORMAL HIGH (ref 70–99)
Glucose-Capillary: 166 mg/dL — ABNORMAL HIGH (ref 70–99)
Glucose-Capillary: 201 mg/dL — ABNORMAL HIGH (ref 70–99)

## 2024-06-25 LAB — COOXEMETRY PANEL
Carboxyhemoglobin: 0.9 % (ref 0.5–1.5)
Carboxyhemoglobin: 1 % (ref 0.5–1.5)
Methemoglobin: <0.7 % (ref 0.0–1.5)
Methemoglobin: <0.7 % (ref 0.0–1.5)
O2 Saturation: 77.4 %
O2 Saturation: 64.4 %
Total hemoglobin: 9.3 g/dL — ABNORMAL LOW (ref 12.0–16.0)
Total hemoglobin: 9.8 g/dL — ABNORMAL LOW (ref 12.0–16.0)

## 2024-06-25 LAB — CBC
HCT: 29 % — ABNORMAL LOW (ref 36.0–46.0)
Hemoglobin: 9.5 g/dL — ABNORMAL LOW (ref 12.0–15.0)
MCH: 27.7 pg (ref 26.0–34.0)
MCHC: 32.8 g/dL (ref 30.0–36.0)
MCV: 84.5 fL (ref 80.0–100.0)
Platelets: 105 K/uL — ABNORMAL LOW (ref 150–400)
RBC: 3.43 MIL/uL — ABNORMAL LOW (ref 3.87–5.11)
RDW: 20.5 % — ABNORMAL HIGH (ref 11.5–15.5)
WBC: 6.6 K/uL (ref 4.0–10.5)
nRBC: 0.6 % — ABNORMAL HIGH (ref 0.0–0.2)

## 2024-06-25 LAB — MAGNESIUM: Magnesium: 2.2 mg/dL (ref 1.7–2.4)

## 2024-06-25 MED ORDER — INFLUENZA VAC SPLIT HIGH-DOSE 0.5 ML IM SUSY
0.5000 mL | PREFILLED_SYRINGE | INTRAMUSCULAR | Status: AC | PRN
Start: 2024-06-25 — End: 2024-07-04
  Administered 2024-07-04: 0.5 mL via INTRAMUSCULAR
  Filled 2024-06-25: qty 0.5

## 2024-06-25 MED ORDER — INSULIN GLARGINE 100 UNIT/ML ~~LOC~~ SOLN
10.0000 [IU] | Freq: Every day | SUBCUTANEOUS | Status: DC
  Administered 2024-06-25: 10 [IU] via SUBCUTANEOUS
  Filled 2024-06-25 (×2): qty 0.1

## 2024-06-25 MED ORDER — SODIUM PHOSPHATES 45 MMOLE/15ML IV SOLN
15.0000 mmol | Freq: Once | INTRAVENOUS | Status: AC
  Administered 2024-06-25: 15 mmol via INTRAVENOUS
  Filled 2024-06-25: qty 5

## 2024-06-25 NOTE — Evaluation (Signed)
 Occupational Therapy Evaluation Patient Details Name: [name] MRN: [id number] DOB: 08-10-1951 Today's Date: 06/25/2024   History of Present Illness   Pt is a 73 y/o F presenting to ED on 9/29 with chest discomfort and SOB. CT chest with R > L pleural effusions, Admitted for acute on chronic CHF and AKI, started on CRRT 9/30. S/p successful DCCV on 10/5. PMH includes CAD, CHF s/p defibrillator, A fib on eliquis , pacemaker, anemia, arthritis, HTN, hypothyroidism, DM, cardiomyopathy.     Clinical Impressions Pt ind at baseline with use of rollator for mobility, ind with ADLs and lives alone in an  apartment with family assist PRN. Pt currently needs set up -mod A for ADLs, min A +2 for bed mobility due to line management/safety. Pt with incr dizziness upon sitting EOB and standing, BP soft with SBP high 80s/90s, RN present during session. Pt presenting with impairments listed below, will follow acutely. Patient will benefit from intensive inpatient follow-up therapy, >3 hours/day.      If plan is discharge home, recommend the following:   A little help with walking and/or transfers;A lot of help with bathing/dressing/bathroom;Assistance with cooking/housework;Assist for transportation;Help with stairs or ramp for entrance     Functional Status Assessment   Patient has had a recent decline in their functional status and demonstrates the ability to make significant improvements in function in a reasonable and predictable amount of time.     Equipment Recommendations   BSC/3in1     Recommendations for Other Services   PT consult;Rehab consult     Precautions/Restrictions   Precautions Precautions: Fall Precaution/Restrictions Comments: Becki, CRRT (shoulder ROM limited <90*), watch BP Restrictions Weight Bearing Restrictions Per Provider Order: No     Mobility Bed Mobility Overal bed mobility: Needs Assistance Bed Mobility: Supine to Sit     Supine to sit: Min  assist, +2 for safety/equipment, HOB elevated          Transfers Overall transfer level: Needs assistance Equipment used: 2 person hand held assist Transfers: Sit to/from Stand Sit to Stand: Min assist, +2 safety/equipment           General transfer comment: pt standing from EOB, chair rolled behind pt due to multiple CRRT/IV lines      Balance Overall balance assessment: Needs assistance Sitting-balance support: Feet supported Sitting balance-Leahy Scale: Good     Standing balance support: During functional activity, Reliant on assistive device for balance Standing balance-Leahy Scale: Poor Standing balance comment: reliant on external support                           ADL either performed or assessed with clinical judgement   ADL Overall ADL's : Needs assistance/impaired Eating/Feeding: Set up;Sitting   Grooming: Set up;Sitting   Upper Body Bathing: Minimal assistance   Lower Body Bathing: Moderate assistance   Upper Body Dressing : Minimal assistance   Lower Body Dressing: Moderate assistance   Toilet Transfer: Minimal assistance;+2 for safety/equipment   Toileting- Clothing Manipulation and Hygiene: Minimal assistance       Functional mobility during ADLs: Minimal assistance;+2 for safety/equipment       Vision   Vision Assessment?: No apparent visual deficits     Perception Perception: Not tested       Praxis Praxis: Not tested       Pertinent Vitals/Pain Pain Assessment Pain Assessment: No/denies pain     Extremity/Trunk Assessment Upper Extremity Assessment Upper Extremity Assessment: Generalized  weakness (within limits of Swann/CRRT precautions)   Lower Extremity Assessment Lower Extremity Assessment: Defer to PT evaluation   Cervical / Trunk Assessment Cervical / Trunk Assessment: Normal   Communication Communication Communication: No apparent difficulties   Cognition Arousal: Alert Behavior During Therapy: WFL  for tasks assessed/performed Cognition: No apparent impairments                               Following commands: Intact       Cueing  General Comments   Cueing Techniques: Verbal cues  dizziness upon sitting/standing, pt with soft BP, SBP  80s-90s   Exercises     Shoulder Instructions      Home Living Family/patient expects to be discharged to:: Private residence Living Arrangements: Alone Available Help at Discharge: Family;Available PRN/intermittently Type of Home: Apartment Home Access: Level entry     Home Layout: One level     Bathroom Shower/Tub: Tub/shower unit         Home Equipment: Grab bars - tub/shower;Rollator (4 wheels);Shower seat          Prior Functioning/Environment Prior Level of Function : Needs assist             Mobility Comments: rollator PRN ADLs Comments: ind    OT Problem List: Decreased strength;Decreased range of motion;Decreased activity tolerance;Impaired balance (sitting and/or standing);Decreased safety awareness;Cardiopulmonary status limiting activity   OT Treatment/Interventions: Self-care/ADL training;Therapeutic exercise;Energy conservation;DME and/or AE instruction;Therapeutic activities;Patient/family education;Balance training      OT Goals(Current goals can be found in the care plan section)   Acute Rehab OT Goals Patient Stated Goal: did not state OT Goal Formulation: With patient Time For Goal Achievement: 07/09/24 Potential to Achieve Goals: Good ADL Goals Pt Will Perform Grooming: with supervision;standing Pt Will Perform Upper Body Dressing: with min assist;sitting;standing Pt Will Perform Lower Body Dressing: with min assist;sitting/lateral leans;sit to/from stand Pt Will Transfer to Toilet: with contact guard assist;ambulating;regular height toilet Additional ADL Goal #1: pt will tolerate OOB standing activity x10 min in order to improve activity tolerance for ADLs   OT Frequency:   Min 2X/week    Co-evaluation PT/OT/SLP Co-Evaluation/Treatment: Yes Reason for Co-Treatment: Complexity of the patient's impairments (multi-system involvement);Necessary to address cognition/behavior during functional activity;To address functional/ADL transfers;For patient/therapist safety   OT goals addressed during session: ADL's and self-care;Strengthening/ROM      AM-PAC OT 6 Clicks Daily Activity     Outcome Measure Help from another person eating meals?: A Little Help from another person taking care of personal grooming?: A Little Help from another person toileting, which includes using toliet, bedpan, or urinal?: A Little Help from another person bathing (including washing, rinsing, drying)?: A Lot Help from another person to put on and taking off regular upper body clothing?: A Lot Help from another person to put on and taking off regular lower body clothing?: A Lot 6 Click Score: 15   End of Session Equipment Utilized During Treatment: Oxygen (2L) Nurse Communication: Mobility status (ok for OOB to chair)  Activity Tolerance: Patient tolerated treatment well Patient left: in chair;with call bell/phone within reach;with nursing/sitter in room (RN present during session)  OT Visit Diagnosis: Unsteadiness on feet (R26.81);Other abnormalities of gait and mobility (R26.89);Muscle weakness (generalized) (M62.81)                Time: 8874-8855 OT Time Calculation (min): 19 min Charges:  OT General Charges $OT Visit: 1 Visit OT Evaluation $  OT Eval Moderate Complexity: 1 Mod  Meztli Llanas K, OTD, OTR/L SecureChat Preferred Acute Rehab [phone]   Laneta POUR Koonce 06/25/2024, 12:26 PM

## 2024-06-25 NOTE — Progress Notes (Addendum)
 PHARMACY - ANTICOAGULATION CONSULT NOTE  Pharmacy Consult:  Heparin  Indication: atrial fibrillation  Allergies  Allergen Reactions   Other Other (See Comments)    FLAGYL , CIPRO , PROTONIX  taken at the same time caused patient to lose her breath and have trouble breathing, requiring hospital stay at [facility], 03/23/14-per patient.    Fentanyl  Nausea Only    Room was spinning prefers not to take it   Lisinopril  Cough    Patient Measurements: Weight: 87.7 kg (193 lb 5.5 oz) Heparin  dosing weight = 79 kg  Vital Signs: Temp: 99.3 F (37.4 C) (10/06 0900) Temp Source: Core (10/06 0800) BP: 100/61 (10/06 0900) Pulse Rate: 108 (10/06 0900)  Labs: Recent Labs    06/23/24 0507 06/23/24 0508 06/23/24 1628 06/23/24 1739 06/24/24 0400 06/24/24 1616 06/25/24 0500  HGB 9.7*  --   --   --  9.7*  --  9.5*  HCT 29.1*  --   --   --  28.8*  --  29.0*  PLT 77*  --   --   --  84*  --  105*  APTT 65*  --   --  80* 87*  --   --   HEPARINUNFRC  --  1.01*  --   --  0.76*  --  0.50  CREATININE  --  1.29*   < >  --  1.36* 1.24* 1.36*   < > = values in this interval not displayed.    Estimated Creatinine Clearance: 39.1 mL/min (A) (by C-G formula based on SCr of 1.36 mg/dL (H)).   Medical History: Past Medical History:  Diagnosis Date   A-fib (HCC) 06/12/2024   Anemia    Arthritis    CAD (coronary artery disease)    a. LHC (03/2014) Lmain: nl, LAD: diff dz proximal 20%, 30-40% dz mid vessel prior 2nd diagonal, LCx: 40% in OM1, 20-30% in OM2, RCA: 99% subtotal occlusion @ crux, TIMI 1 flow, L-R collaterals to distal vessel   Cardiomyopathy (HCC) 03/28/2014   LV dysfunction out of proportion to CAD 03/28/14   CHF (congestive heart failure) (HCC)    Phreesia 10/03/2020   Chronic systolic heart failure (HCC)    a. ECHO (03/2014): EF 25-30%, akinesis enteroanteroseptal myocardium, grade III DD b. RHC (03/2014) RA 4, RV 42/4, PA 44/14 (26), PCWP 21, PA 62% Fick CO/CI 6.9 / 3.4   Diabetes mellitus     Diabetes mellitus without complication (HCC)    Phreesia 10/03/2020   Duodenal ulcer    Age 73   Dyspnea    Erosive esophagitis    Gastritis    Hypertension    Hypothyroidism    Obese    Short-term memory loss    Thyroid  disease    TTP (thrombotic thrombocytopenic purpura) (HCC) 06/2005     Assessment: 73 YOF with cardiogenic shock, now requiring CRRT.  Pharmacy consulted to transition from Eliquis  to IV heparin , last dose on 9/30 at 1001.  Will use aPTT to guide heparin  dosing until heparin  level and aPTT correlates.  Heparin  level came back therapeutic at 0.5, on 1050 units/hr. Hgb 9.5, plt 105. No infusion issues - slight vaginal bleeding (will monitor closely).   Goal of Therapy:  Heparin  level 0.3-0.7 units/ml aPTT 66-102 seconds Monitor platelets by anticoagulation protocol: Yes   Plan:  Continue heparin  1050 units/hr Daily heparin  level, CBC, and s/sx of bleeding daily F/u ability to transition to Eliquis   Thank you for allowing pharmacy to participate in this patient's care,  Suzen Sour,  PharmD, BCCCP Clinical Pharmacist  Phone: [phone] 06/25/2024 10:26 AM  Please check AMION for all [facility] phone numbers After 10:00 PM, call Main Pharmacy [phone]

## 2024-06-25 NOTE — Progress Notes (Signed)

## 2024-06-25 NOTE — Evaluation (Signed)
 Physical Therapy Evaluation Patient Details Name: [name] MRN: [id number] DOB: 1951-01-14 Today's Date: 06/25/2024  History of Present Illness  Pt is a 73 y/o F presenting to ED on 9/29 with chest discomfort and SOB. CT chest with R > L pleural effusions, Admitted for acute on chronic CHF and AKI, started on CRRT 9/30. S/p successful DCCV on 10/5. PMH includes CAD, CHF s/p defibrillator, A fib on eliquis , pacemaker, anemia, arthritis, HTN, hypothyroidism, DM, cardiomyopathy.  Clinical Impression  Pt admitted with above diagnosis. Previously independent with rollator for mobility, living alone with family available PRN. Pt required min assist +2 for bed mobility and transfer. Ambulatory function assessment deferred due to restrictions with Norva Purl and CRRT at this time. Anticipate pt to make good progress as things improve medically. Pt with some dizziness, soft BPs throughout, SpO2 88% on RA, improve with 2L applied end of session. Will follow and progress as tolerated. Request CIR team to follow acutely as well to monitor progress.  Pt currently with functional limitations due to the deficits listed below (see PT Problem List). Pt will benefit from acute skilled PT to increase their independence and safety with mobility to allow discharge.           If plan is discharge home, recommend the following: A little help with walking and/or transfers;A little help with bathing/dressing/bathroom;Assistance with cooking/housework;Assist for transportation   Can travel by private vehicle        Equipment Recommendations Rolling walker (2 wheels) (anticipated, will update as she progresses)  Recommendations for Other Services  Rehab consult    Functional Status Assessment Patient has had a recent decline in their functional status and demonstrates the ability to make significant improvements in function in a reasonable and predictable amount of time.     Precautions / Restrictions  Precautions Precautions: Fall Recall of Precautions/Restrictions: Impaired Precaution/Restrictions Comments: Swann, CRRT (shoulder ROM limited <90*), watch BP Restrictions Weight Bearing Restrictions Per Provider Order: No      Mobility  Bed Mobility Overal bed mobility: Needs Assistance Bed Mobility: Supine to Sit     Supine to sit: Min assist, +2 for safety/equipment, HOB elevated     General bed mobility comments: Cues for technique, min assist for trunk and LEs. Extra time and assist for line/lead management.    Transfers Overall transfer level: Needs assistance Equipment used: 2 person hand held assist Transfers: Sit to/from Stand Sit to Stand: Min assist, +2 safety/equipment           General transfer comment: Min assist for boost to stand +2 for support and safety with line management. Fair control with some assist to descend into chair..    Ambulation/Gait               General Gait Details: Deferred, Norva Purl and CRRT  Stairs            Wheelchair Mobility     Tilt Bed    Modified Rankin (Stroke Patients Only)       Balance Overall balance assessment: Needs assistance Sitting-balance support: Feet supported Sitting balance-Leahy Scale: Good     Standing balance support: During functional activity, Reliant on assistive device for balance, Bilateral upper extremity supported Standing balance-Leahy Scale: Poor Standing balance comment: reliant on external support                             Pertinent Vitals/Pain Pain Assessment Pain Assessment: No/denies  pain    Home Living Family/patient expects to be discharged to:: Private residence Living Arrangements: Alone Available Help at Discharge: Family;Available PRN/intermittently Type of Home: Apartment Home Access: Level entry       Home Layout: One level Home Equipment: Grab bars - tub/shower;Rollator (4 wheels);Shower seat      Prior Function Prior Level of  Function : Needs assist             Mobility Comments: rollator PRN ADLs Comments: ind     Extremity/Trunk Assessment   Upper Extremity Assessment Upper Extremity Assessment: Defer to OT evaluation    Lower Extremity Assessment Lower Extremity Assessment: Generalized weakness    Cervical / Trunk Assessment Cervical / Trunk Assessment: Normal  Communication   Communication Communication: No apparent difficulties    Cognition Arousal: Alert Behavior During Therapy: WFL for tasks assessed/performed   PT - Cognitive impairments: No apparent impairments                         Following commands: Intact       Cueing Cueing Techniques: Verbal cues     General Comments General comments (skin integrity, edema, etc.): dizziness upon sitting/standing, pt with soft BP, SBP 80s-90s. Sats to 88% on RA before 2L reapplied    Exercises General Exercises - Lower Extremity Ankle Circles/Pumps: AROM, Both, 10 reps, Supine Quad Sets: Strengthening, Both, 10 reps, Seated Gluteal Sets: Strengthening, Both, 10 reps, Seated   Assessment/Plan    PT Assessment Patient needs continued PT services  PT Problem List Decreased strength;Decreased activity tolerance;Decreased balance;Decreased mobility;Decreased knowledge of use of DME;Decreased knowledge of precautions;Cardiopulmonary status limiting activity       PT Treatment Interventions DME instruction;Gait training;Functional mobility training;Therapeutic activities;Therapeutic exercise;Balance training;Neuromuscular re-education;Patient/family education    PT Goals (Current goals can be found in the Care Plan section)  Acute Rehab PT Goals Patient Stated Goal: Get well go home PT Goal Formulation: With patient Time For Goal Achievement: 07/09/24 Potential to Achieve Goals: Good    Frequency Min 2X/week     Co-evaluation PT/OT/SLP Co-Evaluation/Treatment: Yes Reason for Co-Treatment: Complexity of the patient's  impairments (multi-system involvement);Necessary to address cognition/behavior during functional activity;To address functional/ADL transfers;For patient/therapist safety PT goals addressed during session: Mobility/safety with mobility;Balance;Strengthening/ROM OT goals addressed during session: ADL's and self-care;Strengthening/ROM       AM-PAC PT 6 Clicks Mobility  Outcome Measure Help needed turning from your back to your side while in a flat bed without using bedrails?: A Little Help needed moving from lying on your back to sitting on the side of a flat bed without using bedrails?: A Little Help needed moving to and from a bed to a chair (including a wheelchair)?: A Lot Help needed standing up from a chair using your arms (e.g., wheelchair or bedside chair)?: A Lot Help needed to walk in hospital room?: A Lot Help needed climbing 3-5 steps with a railing? : Total 6 Click Score: 13    End of Session Equipment Utilized During Treatment: Oxygen Activity Tolerance: Patient tolerated treatment well;Treatment limited secondary to medical complications (Comment) (restricted to transfers only while swan and CRRT in place.) Patient left: in chair;with call bell/phone within reach;with chair alarm set;with nursing/sitter in room Nurse Communication: Mobility status PT Visit Diagnosis: Unsteadiness on feet (R26.81);Other abnormalities of gait and mobility (R26.89);Muscle weakness (generalized) (M62.81);Difficulty in walking, not elsewhere classified (R26.2)    Time: 8872-8857 PT Time Calculation (min) (ACUTE ONLY): 15 min   Charges:  PT Evaluation $PT Eval Moderate Complexity: 1 Mod   PT General Charges $$ ACUTE PT VISIT: 1 Visit         [name], PT, DPT [facility]  Rehabilitation Services Physical Therapist Office: [phone] Website: [email or web address]   [name] 06/25/2024, 1:33 PM

## 2024-06-25 NOTE — Progress Notes (Signed)
 Nutrition Follow-up  DOCUMENTATION CODES:   Non-severe (moderate) malnutrition in context of chronic illness  INTERVENTION:   D/C Calorie Count  Continue liberalized diet  Continue Ensure TID with meals  Continue Renal MVI   NUTRITION DIAGNOSIS:   Moderate Malnutrition related to chronic illness as evidenced by mild fat depletion, moderate muscle depletion.  GOAL:   Patient will meet greater than or equal to 90% of their needs   MONITOR:   PO intake, Diet advancement, Labs, Weight trends  REASON FOR ASSESSMENT:   Consult Assessment of nutrition requirement/status (CRRT)  ASSESSMENT:   73 yo female presents with progress weakness, sternal pressure, shortness of breath and decrease intake for many days and admitted for acute on chronic heart failure with respiratory failure with bilateral pleural effusions and hx of untreated OSA, AKI on CKD. PMH includes CAD, A.Fib,  biventricular HFrE s/p BiVen ICD, CKD 3a, DM  Remains on CRRT Levophed  at 18 mcg/min Vasopressin 0.03 units per min Epinephrine at 1 mcg/min Milrinone at 0.25  Palliative care consulted; not candidate for advanced therapies and wound not want iHD per Advanced HF team notes. Noted GOC meeting planned for tomorrow 06/26/24 at 9:30  Pt is tolerating some po; ate some breakfast this AM (50%) and drank an entire Ensure. Pt ate 75% at lunch.   Recorded po intake 15-75% of meals since diet advancement to Regular  PO intake is improved overall although remains sub-optimal. Encourage oral nutrition supplements.  Weight down to 87.7 kg  10/04 B: 100% Bacon x 2 slices, 50% of Home [place] L: 50% Pinto Beans, 25% Tuna Salad Sandwich D: 10% Spaghetti, 4 ounces of Milk  10/05 B: no meal tray ticket L: 50%Ham and Cheese Sandwich D: no meal tray ticket  Labs: Sodium 134 (L) Potassium 4.1 (wdl) Phosphorus 2.0 (L)  Meds:  SS novolog  Lantus  Renal MVI  Diet Order:   Diet Order             Diet  regular Room service appropriate? Yes; Fluid consistency: Thin  Diet effective now                   EDUCATION NEEDS:   Education needs have been addressed  Skin:  Skin Assessment: Skin Integrity Issues: Skin Integrity Issues:: Other (Comment) Other: MASD to labia  Last BM:  10/5  Height:   Ht Readings from Last 1 Encounters:  06/12/24 5' 5 (1.651 m)    Weight:   Wt Readings from Last 1 Encounters:  06/24/24 87.7 kg    BMI:  Body mass index is 32.17 kg/m.  Estimated Nutritional Needs:   Kcal:  1700-1900 kcals  Protein:  100-125 g  Fluid:  1.5L   Betsey Finger MS, RDN, LDN, CNSC Registered Dietitian 3 Clinical Nutrition RD Inpatient Contact Info in Amion

## 2024-06-25 NOTE — Progress Notes (Signed)
 Nephrology Follow-Up Consult note   Assessment/Recommendations: [name] is a/an 73 y.o. female with a past medical history significant for systolic & diastolic heart failure s/p BiV ICD, CAD, OSA on BiPAP, LBBB, HTN, new Afib who presents to with Chest pain, SOB, cough.     Anuric AKI (not improving): Likely secondary to new RV heart failure, persistent atrial fibrillation, Cardiorenal Syndrome  -failed high dose diuretic challenge and CRRT initiated mainly for volume  Continue CRRT: 9/30-now as remains on inotrope/pressor support  -Chart reviewed: (medications acceptable, does not appear to have been exposed to nephrotoxins with imaging or had episodes of significant hypotension).   -Continue to monitor daily Cr, Dose meds for GFR<15 -Monitor Daily I/Os, Daily weight  -Maintain MAP>65 for optimal renal perfusion.  -Agree with holding RAASi, avoid further nephrotoxins including NSAIDS, Morphine .  Unless absolutely necessary, avoid CT with contrast and/or MRI with gadolinium.        Electrolytes  Hyponatremia: Mild, stable.  Likely due to volume overload state of heart failure.  Should improve with CRRT  Hypophosphatemia: Due to CRRT.  On replacement protocol.   AoC BiV HF; New onset RV failure: Pressors inotropes per primary; CRRT as above.   Atrial fibrillation: On amiodarone drip.  S/p cardioversion 10/5.  Remains in sinus rhythm.    CAD: On atorvastatin   D/w RN bedside - no issues with CRRT, continue for now  Manuelita DELENA Barters [facility] 06/25/2024 8:37 AM  ___________________________________________________________  CC: Chest pain  Interval History/Subjective:  S/p cardioversion yesterday - says felt better immediately and remains in ST. Remains anuric.  CRRT UF 2.7L net neg 12.4L for admit.   Remains on epi, milrinine, NE, vaso.     Medications:  Current Facility-Administered Medications  Medication Dose Route Frequency Provider Last Rate Last  Admin   acetaminophen  (TYLENOL ) tablet 650 mg  650 mg Oral Q6H PRN Patel, Vishal R, MD   650 mg at 06/22/24 0008   Or   acetaminophen  (TYLENOL ) suppository 650 mg  650 mg Rectal Q6H PRN Patel, Vishal R, MD       albuterol  (PROVENTIL ) (2.5 MG/3ML) 0.083% nebulizer solution 2.5 mg  2.5 mg Nebulization Q6H PRN Patel, Vishal R, MD   2.5 mg at 06/18/24 0143   amiodarone (NEXTERONE PREMIX) 360-4.14 MG/200ML-% (1.8 mg/mL) IV infusion  60 mg/hr Intravenous Continuous Lee, [place], NP 33.3 mL/hr at 06/25/24 0800 60 mg/hr at 06/25/24 0800   atorvastatin  (LIPITOR ) tablet 80 mg  80 mg Oral Daily Patel, Vishal R, MD   80 mg at 06/24/24 1230   bisacodyl (DULCOLAX) EC tablet 5 mg  5 mg Oral Daily PRN Patel, Vishal R, MD       calcium  carbonate (TUMS - dosed in mg elemental calcium ) chewable tablet 200 mg of elemental calcium   200 mg of elemental calcium  Oral TID PRN Bensimhon, Daniel R, MD   200 mg of elemental calcium  at 06/22/24 0947   Chlorhexidine  Gluconate Cloth 2 % PADS 6 each  6 each Topical Daily Lee, [place], NP   6 each at 06/24/24 0920   EPINEPHrine (ADRENALIN) 10 mg in sodium chloride  0.9 % 250 mL (0.04 mg/mL) infusion  2 mcg/min Intravenous Continuous Bensimhon, Toribio SAUNDERS, MD 3 mL/hr at 06/25/24 0800 2 mcg/min at 06/25/24 0800   feeding supplement (ENSURE PLUS HIGH PROTEIN) liquid 237 mL  237 mL Oral TID BM Bensimhon, Daniel R, MD   237 mL at 06/24/24 2002   heparin  ADULT infusion 100 units/mL (25000 units/250mL)  1,050 Units/hr Intravenous Continuous Katsaros, Lindsey R, RPH 10.5 mL/hr at 06/25/24 0800 1,050 Units/hr at 06/25/24 0800   heparin  injection 1,000-6,000 Units  1,000-6,000 Units CRRT PRN Darden, Timothy M, DO   2,800 Units at 06/19/24 1711   insulin  aspart (novoLOG ) injection 0-20 Units  0-20 Units Subcutaneous TID WC Colletta Manuelita Garre, PA-C   15 Units at 06/25/24 9193   insulin  aspart (novoLOG ) injection 0-5 Units  0-5 Units Subcutaneous QHS Colletta Manuelita Garre, PA-C   2 Units at  06/22/24 2100   levothyroxine  (SYNTHROID ) tablet 50 mcg  50 mcg Oral Q0600 Patel, Vishal R, MD   50 mcg at 06/25/24 0554   loperamide (IMODIUM) capsule 2 mg  2 mg Oral Q6H PRN Colletta Manuelita Garre, PA-C   2 mg at 06/22/24 1054   milrinone (PRIMACOR) 20 MG/100 ML (0.2 mg/mL) infusion  0.25 mcg/kg/min Intravenous Continuous Zenaida Morene PARAS, MD 7.02 mL/hr at 06/25/24 0800 0.25 mcg/kg/min at 06/25/24 0800   multivitamin (RENA-VIT) tablet 1 tablet  1 tablet Oral QHS Bensimhon, Daniel R, MD   1 tablet at 06/24/24 2151   norepinephrine  (LEVOPHED ) 16 mg in 250mL (0.064 mg/mL) premix infusion  0-20 mcg/min Intravenous Titrated Colletta Manuelita Garre, PA-C 18.75 mL/hr at 06/25/24 0800 20 mcg/min at 06/25/24 0800   ondansetron  (ZOFRAN ) tablet 4 mg  4 mg Oral Q6H PRN Patel, Vishal R, MD       Or   ondansetron  (ZOFRAN ) injection 4 mg  4 mg Intravenous Q6H PRN Patel, Vishal R, MD   4 mg at 06/22/24 0602   Oral care mouth rinse  15 mL Mouth Rinse PRN Bensimhon, Toribio SAUNDERS, MD       prismasol BGK 4/2.5 infusion   CRRT Continuous Windle Evalene HERO, DO 400 mL/hr at 06/24/24 0849 New Bag at 06/24/24 0849   prismasol BGK 4/2.5 infusion   CRRT Continuous Windle Evalene HERO, DO 400 mL/hr at 06/24/24 1515 New Bag at 06/24/24 1515   prismasol BGK 4/2.5 infusion   CRRT Continuous Windle Evalene HERO, DO 1,500 mL/hr at 06/24/24 2334 New Bag at 06/24/24 2334   scopolamine  (TRANSDERM-SCOP) 1 MG/3DAYS 1 mg  1 patch Transdermal Q72H Sherryll Suzen SQUIBB, RPH   1 mg at 06/23/24 9068   senna-docusate (Senokot-S) tablet 1 tablet  1 tablet Oral QHS PRN Tobie Jorie SAUNDERS, MD       sodium chloride  0.9 % primer fluid for CRRT   CRRT PRN Darden, Timothy M, DO       sodium chloride  flush (NS) 0.9 % injection 10-40 mL  10-40 mL Intracatheter Q12H Bensimhon, Toribio SAUNDERS, MD   10 mL at 06/24/24 2152   sodium chloride  flush (NS) 0.9 % injection 10-40 mL  10-40 mL Intracatheter PRN Bensimhon, Toribio SAUNDERS, MD       sodium chloride  flush (NS) 0.9 %  injection 3 mL  3 mL Intravenous Q12H Tobie Jorie R, MD   3 mL at 06/24/24 2152   sodium chloride  flush (NS) 0.9 % injection 3 mL  3 mL Intravenous Q12H Lee, [place], NP   3 mL at 06/24/24 2152   sodium chloride  flush (NS) 0.9 % injection 3 mL  3 mL Intravenous PRN Lee, Jordan, NP       trimethobenzamide PHYLLISTINE) injection 200 mg  200 mg Intramuscular Q6H PRN Lee, Jordan, NP       vasopressin (PITRESSIN) 20 Units in 100 mL (0.2 unit/mL) infusion-*FOR SHOCK*  0-0.03 Units/min Intravenous Continuous Lee, [place], NP 9 mL/hr at 06/25/24  0800 0.03 Units/min at 06/25/24 0800      Review of Systems: 10 systems reviewed and negative except per interval history/subjective  Physical Exam: Vitals:   06/25/24 0800 06/25/24 0815  BP: (!) 100/58 97/66  Pulse: (!) 103 (!) 103  Resp: 19 (!) 21  Temp: 99.3 F (37.4 C) 99.3 F (37.4 C)  SpO2: 96% 96%   Total I/O In: 82.8 [I.V.:82.8] Out: 100   Intake/Output Summary (Last 24 hours) at 06/25/2024 9162 Last data filed at 06/25/2024 0800 Gross per 24 hour  Intake 2412.93 ml  Output 2682.7 ml  Net -269.77 ml   General: well-appearing, no acute distress, on 3LNC CV: ST on monitor, trace peripheral edema Lungs: Bilateral breath, normal work of breathing Abd: soft, non-tender, non-distended Skin: no visible lesions or rashes Psych: Alert Musculoskeletal: no obvious deformities LIJ c/d/i  Test Results I personally reviewed new and old clinical labs and radiology tests Lab Results  Component Value Date   NA 131 (L) 06/25/2024   K 4.3 06/25/2024   CL 98 06/25/2024   CO2 22 06/25/2024   BUN 9 06/25/2024   CREATININE 1.36 (H) 06/25/2024   CALCIUM  8.1 (L) 06/25/2024   ALBUMIN 2.7 (L) 06/25/2024   PHOS 2.1 (L) 06/25/2024    CBC Recent Labs  Lab 06/23/24 0507 06/24/24 0400 06/25/24 0500  WBC 7.8 7.4 6.6  HGB 9.7* 9.7* 9.5*  HCT 29.1* 28.8* 29.0*  MCV 84.3 83.7 84.5  PLT 77* 84* 105*

## 2024-06-25 NOTE — Progress Notes (Signed)
   06/25/24 2331  BiPAP/CPAP/SIPAP  BiPAP/CPAP/SIPAP Pt Type Adult  Reason BIPAP/CPAP not in use Non-compliant

## 2024-06-25 NOTE — Progress Notes (Signed)
 Advanced Heart Failure Rounding Note  Cardiologist: None   Chief Complaint: CHF  Subjective:    RHC 9/30: RA 17, PA 63/35 (46), PCW 40, Fick 5.7/2.8, TD 3.7/1.8, PVR 1.6, PAPi 1.6 + HDC placed  Norepi 18 mcg + vaso 0.03 units + milrinone 0.25 mcg + Epi 1 mcg.amio 60 mg per hour  PAP: (42-326)/(9-33) 55/17 CVP:  [4 mmHg-29 mmHg] 7 mmHg PCWP:  [20 mmHg] 20 mmHg CO:  [3.3 L/min-4.7 L/min] 4.7 L/min CI:  [1.6 L/min/m2-2.31 L/min/m2] 2.31 L/min/m2  Complaining of discomfort from central lines. RN reports small amount of vaginal bleeding this morning.   Objective:    Weight Range: 87.7 kg Body mass index is 32.17 kg/m.   Vital Signs:   Temp:  [97.9 F (36.6 C)-99.3 F (37.4 C)] 99.1 F (37.3 C) (10/06 1030) Pulse Rate:  [85-129] 100 (10/06 1030) Resp:  [10-28] 20 (10/06 1030) BP: (75-113)/(53-81) 99/62 (10/06 1030) SpO2:  [85 %-100 %] 95 % (10/06 1030) Last BM Date : 06/24/24  Weight change: Filed Weights   06/22/24 0600 06/23/24 0500 06/24/24 0701  Weight: 91.1 kg 90.4 kg 87.7 kg   Intake/Output:  Intake/Output Summary (Last 24 hours) at 06/25/2024 1047 Last data filed at 06/25/2024 1000 Gross per 24 hour  Intake 2597.71 ml  Output 2491.6 ml  Net 106.11 ml   CVP 10-11 Physical Exam    General:   No resp difficulty. On CRRT Neck: RIJ LIJ Cor: Irregular rate & rhythm.  Lungs: clear Abdomen: soft, nontender, nondistended.  Extremities: no  edema Neuro: alert & oriented x3   Telemetry     A Fib 90-100s    Labs    CBC Recent Labs    06/24/24 0400 06/25/24 0500  WBC 7.4 6.6  HGB 9.7* 9.5*  HCT 28.8* 29.0*  MCV 83.7 84.5  PLT 84* 105*   Basic Metabolic Panel Recent Labs    89/94/74 0400 06/24/24 1616 06/25/24 0500  NA 131* 133* 131*  K 4.3 4.2 4.3  CL 97* 100 98  CO2 22 25 22   GLUCOSE 245* 150* 251*  BUN 9 8 9   CREATININE 1.36* 1.24* 1.36*  CALCIUM  8.1* 8.1* 8.1*  MG 2.2  --  2.2  PHOS 2.2* 2.7 2.1*   Liver Function  Tests Recent Labs    06/24/24 1616 06/25/24 0500  ALBUMIN 2.8* 2.7*   BNP (last 3 results) Recent Labs    06/17/24 1813  BNP 1,998.5*   Medications:    Scheduled Medications:  atorvastatin   80 mg Oral Daily   Chlorhexidine  Gluconate Cloth  6 each Topical Daily   feeding supplement  237 mL Oral TID BM   insulin  aspart  0-20 Units Subcutaneous TID WC   insulin  aspart  0-5 Units Subcutaneous QHS   insulin  glargine  10 Units Subcutaneous Daily   levothyroxine   50 mcg Oral Q0600   multivitamin  1 tablet Oral QHS   scopolamine   1 patch Transdermal Q72H   sodium chloride  flush  10-40 mL Intracatheter Q12H   sodium chloride  flush  3 mL Intravenous Q12H   sodium chloride  flush  3 mL Intravenous Q12H    Infusions:  amiodarone 60 mg/hr (06/25/24 1000)   epinephrine 2 mcg/min (06/25/24 1000)   heparin  1,050 Units/hr (06/25/24 1000)   milrinone 0.25 mcg/kg/min (06/25/24 1000)   norepinephrine  (LEVOPHED ) Adult infusion 18 mcg/min (06/25/24 1000)   prismasol BGK 4/2.5 400 mL/hr at 06/25/24 1032   prismasol BGK 4/2.5 400 mL/hr at 06/24/24  1515   prismasol BGK 4/2.5 1,500 mL/hr at 06/25/24 9090   sodium PHOSPHATE IVPB (in mmol)     vasopressin 0.03 Units/min (06/25/24 1000)    PRN Medications: acetaminophen  **OR** acetaminophen , albuterol , bisacodyl, calcium  carbonate, heparin , loperamide, ondansetron  **OR** ondansetron  (ZOFRAN ) IV, mouth rinse, senna-docusate, sodium chloride , sodium chloride  flush, sodium chloride  flush, trimethobenzamide  Patient Profile   [name] is a 73 y.o.  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   Admit with acute on chronic biventricular HF in setting of AF c/b acute renal failure.  Assessment/Plan   Acute on Chronic Biventricular Heart Failure  New onset RV Failure -> cardiogenic shock - mixed ischemic, nonischemic, EF initially  down to 25-30% in 2015, LV dysfunction felt to be out of proportion to CAD. EF has been stable in 30-35% - LA normal in ED, diuresis has been limited by hypotension and worsening renal function concerning for low ouput - would favor getting back to NSR, suspect that decompensation related to atrial fibrillation - echo 9/30: EF 20-25%, mod LVH, D-shaped septum, RV mod reduced, degenerative MV w/ severe MR - RHC 9/30: RA 17, PA 63/35 (46), PCW 40, Fick 5.7/2.8, TD 3.7/1.8, PVR 1.6, PAPi 1.6  -Remains on Norepi 18 + Vaso 0.03 + Milrinone 0.25 mcg + Epi . CI 2.3 Managing volume with CRRT.   - Not candidate for advanced therapies and would not want HD - She is end-stage.  - Palliative Care plans to meet with her son.   Persistent Atrial Fibrillation - new onset 8/25 - continue heparin  gtt - continue amio 60 mg/hr - 10/5 S/P TEE/DC-CV -->SR   Severe MR - not noted on last echo 5/23 - severe on echo 9/30 with degenerative valve   Acute renal failure - cardio-renal  -  now on CRRT - continue volume removal, see above. Continue amio for rate control.  - hope for renal recovery, she would not want outpatient HD - no change   CAD - LHC in 2015 showed 99% RCA occlusion with collaterals - hs-trop 35>35, No ACS - continue atorva 80 mg daily  Acute Hypoxic Respiratory Failure On 6 liters [place]. Oxygen saturations stable.   Vaginal Bleeding Per nursing staff. Small amount. Hgb Stable. Continue to monitor.   GOC conversation pending. She does not want HD.    Length of Stay: 8   Lazarus Sudbury, NP  06/25/2024, 10:47 AM  Advanced Heart Failure Team Pager [phone] (M-F; 7a - 5p)  Please contact [facility] for night-coverage after hours (5p -7a ) and weekends on [email or web address]

## 2024-06-25 NOTE — Plan of Care (Signed)
    Progress Note from the Palliative Medicine Team at [facility]   Patient Name: [name]        Date: 06/25/2024 DOB: 1950-11-17  Age: 73 y.o. MRN#: [id number] Attending Physician: Zenaida Morene PARAS, MD Primary Care Physician: Tobie Suzzane POUR, MD Admit Date: 06/17/2024   Patient request son to be present for goals of care discussion.   I spoke to patient's son by telephone and a family meeting/goals of care meeting is scheduled for tomorrow morning Tuesday 06-26-24 at 9:30  No charge  Ronal Plants NP  Palliative Medicine Team Team Phone # [phone] Pager [phone]

## 2024-06-26 DIAGNOSIS — I10 Essential (primary) hypertension: Secondary | ICD-10-CM | POA: Diagnosis not present

## 2024-06-26 DIAGNOSIS — I5082 Biventricular heart failure: Secondary | ICD-10-CM | POA: Diagnosis not present

## 2024-06-26 DIAGNOSIS — Z7189 Other specified counseling: Secondary | ICD-10-CM | POA: Diagnosis not present

## 2024-06-26 DIAGNOSIS — I509 Heart failure, unspecified: Secondary | ICD-10-CM | POA: Diagnosis not present

## 2024-06-26 DIAGNOSIS — R570 Cardiogenic shock: Secondary | ICD-10-CM | POA: Diagnosis not present

## 2024-06-26 DIAGNOSIS — E8779 Other fluid overload: Secondary | ICD-10-CM | POA: Diagnosis not present

## 2024-06-26 DIAGNOSIS — R531 Weakness: Secondary | ICD-10-CM | POA: Diagnosis not present

## 2024-06-26 DIAGNOSIS — E871 Hypo-osmolality and hyponatremia: Secondary | ICD-10-CM | POA: Diagnosis not present

## 2024-06-26 DIAGNOSIS — I5023 Acute on chronic systolic (congestive) heart failure: Secondary | ICD-10-CM | POA: Diagnosis not present

## 2024-06-26 DIAGNOSIS — N179 Acute kidney failure, unspecified: Secondary | ICD-10-CM | POA: Diagnosis not present

## 2024-06-26 DIAGNOSIS — Z515 Encounter for palliative care: Secondary | ICD-10-CM | POA: Diagnosis not present

## 2024-06-26 LAB — RENAL FUNCTION PANEL
Albumin: 2.9 g/dL — ABNORMAL LOW (ref 3.5–5.0)
Albumin: 2.8 g/dL — ABNORMAL LOW (ref 3.5–5.0)
Anion gap: 8 (ref 5–15)
Anion gap: 10 (ref 5–15)
BUN: 8 mg/dL (ref 8–23)
BUN: 8 mg/dL (ref 8–23)
CO2: 24 mmol/L (ref 22–32)
CO2: 25 mmol/L (ref 22–32)
Calcium: 8.1 mg/dL — ABNORMAL LOW (ref 8.9–10.3)
Calcium: 8.1 mg/dL — ABNORMAL LOW (ref 8.9–10.3)
Chloride: 100 mmol/L (ref 98–111)
Chloride: 96 mmol/L — ABNORMAL LOW (ref 98–111)
Creatinine, Ser: 1.27 mg/dL — ABNORMAL HIGH (ref 0.44–1.00)
Creatinine, Ser: 1.54 mg/dL — ABNORMAL HIGH (ref 0.44–1.00)
GFR, Estimated: 44 mL/min — ABNORMAL LOW (ref >60)
GFR, Estimated: 35 mL/min — ABNORMAL LOW (ref >60)
Glucose, Bld: 226 mg/dL — ABNORMAL HIGH (ref 70–99)
Glucose, Bld: 237 mg/dL — ABNORMAL HIGH (ref 70–99)
Phosphorus: 1.6 mg/dL — ABNORMAL LOW (ref 2.5–4.6)
Phosphorus: 2.7 mg/dL (ref 2.5–4.6)
Potassium: 4.1 mmol/L (ref 3.5–5.1)
Potassium: 4.4 mmol/L (ref 3.5–5.1)
Sodium: 132 mmol/L — ABNORMAL LOW (ref 135–145)
Sodium: 131 mmol/L — ABNORMAL LOW (ref 135–145)

## 2024-06-26 LAB — CBC
HCT: 29.5 % — ABNORMAL LOW (ref 36.0–46.0)
Hemoglobin: 9.8 g/dL — ABNORMAL LOW (ref 12.0–15.0)
MCH: 28 pg (ref 26.0–34.0)
MCHC: 33.2 g/dL (ref 30.0–36.0)
MCV: 84.3 fL (ref 80.0–100.0)
Platelets: 95 K/uL — ABNORMAL LOW (ref 150–400)
RBC: 3.5 MIL/uL — ABNORMAL LOW (ref 3.87–5.11)
RDW: 20.6 % — ABNORMAL HIGH (ref 11.5–15.5)
WBC: 6.7 K/uL (ref 4.0–10.5)
nRBC: 0.9 % — ABNORMAL HIGH (ref 0.0–0.2)

## 2024-06-26 LAB — GLUCOSE, CAPILLARY
Glucose-Capillary: 200 mg/dL — ABNORMAL HIGH (ref 70–99)
Glucose-Capillary: 295 mg/dL — ABNORMAL HIGH (ref 70–99)
Glucose-Capillary: 211 mg/dL — ABNORMAL HIGH (ref 70–99)
Glucose-Capillary: 204 mg/dL — ABNORMAL HIGH (ref 70–99)

## 2024-06-26 LAB — MAGNESIUM: Magnesium: 2.3 mg/dL (ref 1.7–2.4)

## 2024-06-26 LAB — COOXEMETRY PANEL
Carboxyhemoglobin: 1.3 % (ref 0.5–1.5)
Methemoglobin: <0.7 % (ref 0.0–1.5)
O2 Saturation: 63.2 %
Total hemoglobin: 9.7 g/dL — ABNORMAL LOW (ref 12.0–16.0)

## 2024-06-26 LAB — HEPARIN LEVEL (UNFRACTIONATED): Heparin Unfractionated: 0.42 [IU]/mL (ref 0.30–0.70)

## 2024-06-26 MED ORDER — INSULIN GLARGINE 100 UNIT/ML ~~LOC~~ SOLN
18.0000 [IU] | Freq: Every day | SUBCUTANEOUS | Status: DC
  Administered 2024-06-26 – 2024-06-27 (×2): 18 [IU] via SUBCUTANEOUS
  Filled 2024-06-26 (×2): qty 0.18

## 2024-06-26 MED ORDER — POTASSIUM PHOSPHATES 15 MMOLE/5ML IV SOLN
30.0000 mmol | Freq: Once | INTRAVENOUS | Status: AC
  Administered 2024-06-26: 30 mmol via INTRAVENOUS
  Filled 2024-06-26: qty 10

## 2024-06-26 MED ORDER — MIDODRINE HCL 5 MG PO TABS
15.0000 mg | ORAL_TABLET | Freq: Three times a day (TID) | ORAL | Status: DC
  Administered 2024-06-26 – 2024-06-28 (×7): 15 mg via ORAL
  Filled 2024-06-26 (×7): qty 3

## 2024-06-26 NOTE — Progress Notes (Signed)
 This chaplain responded to PMT [facility] consult for creating the Pt. Advance Directive. The Pt. is awake and accepting of HCPOA education. The Pt. son-Brooks and daughter-in-law are visiting at the bedside.   The chaplain understands after AD education, the Pt. choice for HCPOA is [name]. The Pt. and family accepted the incomplete AD as a resource for reviewing education together. The chaplain understands the Pt. and family are requesting chaplain F/U on Wednesday.  Chaplain Leeroy Hummer [phone]

## 2024-06-26 NOTE — Progress Notes (Signed)
 Advanced Heart Failure Rounding Note  Cardiologist: None   Chief Complaint: CHF  Subjective:    RHC 9/30: RA 17, PA 63/35 (46), PCW 40, Fick 5.7/2.8, TD 3.7/1.8, PVR 1.6, PAPi 1.6 + HDC placed  Norepi 16 mcg + vaso 0.03 units + milrinone 0.25 mcg +.amio 60 mg per hour  PAP: (43-72)/(15-36) 56/21 CVP:  [7 mmHg-24 mmHg] 12 mmHg PCWP:  [22 mmHg-26 mmHg] 22 mmHg CO:  [3.5 L/min-4.9 L/min] 4.3 L/min CI:  [1.68 L/min/m2-2.39 L/min/m2] 2.09 L/min/m2  Frustrated about her condition  Objective:    Weight Range: 84.8 kg Body mass index is 31.11 kg/m.   Vital Signs:   Temp:  [93.7 F (34.3 C)-99.5 F (37.5 C)] 98.6 F (37 C) (10/07 1015) Pulse Rate:  [87-111] 93 (10/07 1015) Resp:  [10-32] 27 (10/07 1015) BP: (77-125)/(49-92) 100/65 (10/07 1015) SpO2:  [90 %-100 %] 96 % (10/07 1015) Weight:  [84.8 kg] 84.8 kg (10/07 0600) Last BM Date : 06/25/24 (per day shift RN)  Weight change: Filed Weights   06/23/24 0500 06/24/24 0701 06/26/24 0600  Weight: 90.4 kg 87.7 kg 84.8 kg   Intake/Output:  Intake/Output Summary (Last 24 hours) at 06/26/2024 1022 Last data filed at 06/26/2024 1000 Gross per 24 hour  Intake 3566.99 ml  Output 5915 ml  Net -2348.01 ml   CVP 11-12  Physical Exam    General:   No resp difficulty. On CRRT  Neck:JVP 11-12  Cor: Irregular rate & rhythm.  Lungs: clear Abdomen: soft, nontender, nondistended.  Extremities: no  edema Neuro: alert & oriented x3  Telemetry   A fib 90-100s   Labs    CBC Recent Labs    06/25/24 0500 06/26/24 0444  WBC 6.6 6.7  HGB 9.5* 9.8*  HCT 29.0* 29.5*  MCV 84.5 84.3  PLT 105* 95*   Basic Metabolic Panel Recent Labs    89/93/74 0500 06/25/24 1700 06/26/24 0444  NA 131* 134* 132*  K 4.3 4.1 4.1  CL 98 101 100  CO2 22 25 24   GLUCOSE 251* 175* 226*  BUN 9 11 8   CREATININE 1.36* 1.36* 1.27*  CALCIUM  8.1* 8.0* 8.1*  MG 2.2  --  2.3  PHOS 2.1* 2.0* 1.6*   Liver Function Tests Recent Labs     06/25/24 1700 06/26/24 0444  ALBUMIN 2.8* 2.9*   BNP (last 3 results) Recent Labs    06/17/24 1813  BNP 1,998.5*   Medications:    Scheduled Medications:  atorvastatin   80 mg Oral Daily   Chlorhexidine  Gluconate Cloth  6 each Topical Daily   feeding supplement  237 mL Oral TID BM   insulin  aspart  0-20 Units Subcutaneous TID WC   insulin  aspart  0-5 Units Subcutaneous QHS   insulin  glargine  18 Units Subcutaneous Daily   levothyroxine   50 mcg Oral Q0600   midodrine  15 mg Oral Q8H   multivitamin  1 tablet Oral QHS   scopolamine   1 patch Transdermal Q72H   sodium chloride  flush  10-40 mL Intracatheter Q12H   sodium chloride  flush  3 mL Intravenous Q12H   sodium chloride  flush  3 mL Intravenous Q12H    Infusions:  amiodarone 60 mg/hr (06/26/24 1000)   heparin  1,050 Units/hr (06/26/24 1000)   milrinone 0.125 mcg/kg/min (06/26/24 1000)   norepinephrine  (LEVOPHED ) Adult infusion 17 mcg/min (06/26/24 1000)   potassium PHOSPHATE IVPB (in mmol) 85 mL/hr at 06/26/24 1000   prismasol BGK 4/2.5 400 mL/hr at 06/25/24 2331  prismasol BGK 4/2.5 400 mL/hr at 06/26/24 0602   prismasol BGK 4/2.5 1,500 mL/hr at 06/26/24 9090   vasopressin 0.03 Units/min (06/26/24 1014)    PRN Medications: acetaminophen  **OR** acetaminophen , albuterol , bisacodyl, calcium  carbonate, heparin , Influenza vac split trivalent PF, loperamide, ondansetron  **OR** ondansetron  (ZOFRAN ) IV, mouth rinse, senna-docusate, sodium chloride , sodium chloride  flush, sodium chloride  flush, trimethobenzamide  Patient Profile   [name] is a 73 y.o.  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   Admit with acute on chronic biventricular HF in setting of AF c/b acute renal failure.  Assessment/Plan   Acute on Chronic Biventricular Heart Failure  New onset RV Failure -> cardiogenic shock - mixed ischemic,  nonischemic, EF initially down to 25-30% in 2015, LV dysfunction felt to be out of proportion to CAD. EF has been stable in 30-35% - LA normal in ED, diuresis has been limited by hypotension and worsening renal function concerning for low ouput - would favor getting back to NSR, suspect that decompensation related to atrial fibrillation - echo 9/30: EF 20-25%, mod LVH, D-shaped septum, RV mod reduced, degenerative MV w/ severe MR - RHC 9/30: RA 17, PA 63/35 (46), PCW 40, Fick 5.7/2.8, TD 3.7/1.8, PVR 1.6, PAPi 1.6  -CO-OX stable. Remains on Norepi 16 + Vaso 0.03. Cut back Milrinone 0.125 mcg -Add midodrine 15 mg three times a day.  -Managing volume with CRRT.   - Not candidate for advanced therapies and would not want HD - She is end-stage.  - Palliative Care plans to meet with her son.   Persistent Atrial Fibrillation - new onset 8/25 - continue heparin  gtt - 10/5 S/P TEE/DC-CV -->SR   Severe MR - not noted on last echo 5/23 - severe on echo 9/30 with degenerative valve   Acute renal failure - cardio-renal  -  now on CRRT - continue volume removal, see above. Continue amio for rate control.  - hope for renal recovery, she would not want outpatient HD - no change   CAD - LHC in 2015 showed 99% RCA occlusion with collaterals - hs-trop 35>35, No ACS - continue atorva 80 mg daily  Acute Hypoxic Respiratory Failure On 6 liters [place]. Oxygen saturations stable.   Vaginal Bleeding Per nursing staff. Small amount. Hgb Stable. Continue to monitor.   GOC conversation today. Palliative will continue to follow.    Length of Stay: 9   [name], NP  06/26/2024, 10:22 AM  Advanced Heart Failure Team Pager [phone] (M-F; 7a - 5p)  Please contact [facility] for night-coverage after hours (5p -7a ) and weekends on [email or web address]

## 2024-06-26 NOTE — Progress Notes (Signed)
 PHARMACY - ANTICOAGULATION CONSULT NOTE  Pharmacy Consult:  Heparin  Indication: atrial fibrillation  Allergies  Allergen Reactions   Other Other (See Comments)    FLAGYL , CIPRO , PROTONIX  taken at the same time caused patient to lose her breath and have trouble breathing, requiring hospital stay at [facility], 03/23/14-per patient.    Fentanyl  Nausea Only    Room was spinning prefers not to take it   Lisinopril  Cough    Patient Measurements: Height: 5' 5 (165.1 cm) Weight: 84.8 kg (186 lb 15.2 oz) IBW/kg (Calculated) : 57 HEPARIN  DW (KG): 75.3 Heparin  dosing weight = 79 kg  Vital Signs: Temp: 98.6 F (37 C) (10/07 0830) Temp Source: Core (10/07 0805) BP: 101/60 (10/07 0830) Pulse Rate: 93 (10/07 0830)  Labs: Recent Labs    06/23/24 1628 06/23/24 1739 06/24/24 0400 06/24/24 1616 06/25/24 0500 06/25/24 1700 06/26/24 0444  HGB   < >  --  9.7*  --  9.5*  --  9.8*  HCT  --   --  28.8*  --  29.0*  --  29.5*  PLT  --   --  84*  --  105*  --  95*  APTT  --  80* 87*  --   --   --   --   HEPARINUNFRC  --   --  0.76*  --  0.50  --  0.42  CREATININE  --   --  1.36*   < > 1.36* 1.36* 1.27*   < > = values in this interval not displayed.    Estimated Creatinine Clearance: 41.1 mL/min (A) (by C-G formula based on SCr of 1.27 mg/dL (H)).   Medical History: Past Medical History:  Diagnosis Date   A-fib (HCC) 06/12/2024   Anemia    Arthritis    CAD (coronary artery disease)    a. LHC (03/2014) Lmain: nl, LAD: diff dz proximal 20%, 30-40% dz mid vessel prior 2nd diagonal, LCx: 40% in OM1, 20-30% in OM2, RCA: 99% subtotal occlusion @ crux, TIMI 1 flow, L-R collaterals to distal vessel   Cardiomyopathy (HCC) 03/28/2014   LV dysfunction out of proportion to CAD 03/28/14   CHF (congestive heart failure) (HCC)    Phreesia 10/03/2020   Chronic systolic heart failure (HCC)    a. ECHO (03/2014): EF 25-30%, akinesis enteroanteroseptal myocardium, grade III DD b. RHC (03/2014) RA 4, RV 42/4, PA  44/14 (26), PCWP 21, PA 62% Fick CO/CI 6.9 / 3.4   Diabetes mellitus    Diabetes mellitus without complication (HCC)    Phreesia 10/03/2020   Duodenal ulcer    Age 73   Dyspnea    Erosive esophagitis    Gastritis    Hypertension    Hypothyroidism    Obese    Short-term memory loss    Thyroid  disease    TTP (thrombotic thrombocytopenic purpura) (HCC) 06/2005    Assessment: 73 YOF with cardiogenic shock, now requiring CRRT.  Pharmacy consulted to transition from Eliquis  to IV heparin , last dose on 9/30 at 1001.  Will use aPTT to guide heparin  dosing until heparin  level and aPTT correlates.  06/26/24 AM: Heparin  level 0.42, therapeutic on heparin  1050 units/hr. No issues with infusion running or signs of bleeding noted. CBC stable (Hgb 9.8, PLT 95).   Goal of Therapy:  Heparin  level 0.3-0.7 units/ml aPTT 66-102 seconds Monitor platelets by anticoagulation protocol: Yes   Plan:  Continue heparin  1050 units/hr Daily heparin  level, CBC, and s/sx of bleeding daily F/u ability to transition  to Eliquis   Thank you for allowing pharmacy to participate in this patient's care,  Morna Breach, PharmD PGY2 Cardiology Pharmacy Resident 06/26/2024 9:05 AM  Please check AMION for all [facility] Pharm[facility] phone numbers After 10:00 PM, call Main Pharmacy [phone]

## 2024-06-26 NOTE — Progress Notes (Signed)
 Nephrology Follow-Up Consult note   Assessment/Recommendations: [name] is a/an 73 y.o. female with a past medical history significant for systolic & diastolic heart failure s/p BiV ICD, CAD, OSA on BiPAP, LBBB, HTN, new Afib who presents to with Chest pain, SOB, cough.     Anuric AKI (not improving): Likely secondary to new RV heart failure, persistent atrial fibrillation, Cardiorenal Syndrome  -failed high dose diuretic challenge and CRRT initiated mainly for volume  Continue CRRT: 9/30-now as remains on inotrope/pressor support -would not be able to tolerate iHD at this point  -Continue to monitor daily Cr, Dose meds for GFR<15 -Monitor Daily I/Os, Daily weight  -Maintain MAP>65 for optimal renal perfusion.  -Agree with holding RAASi, avoid further nephrotoxins including NSAIDS, Morphine .  Unless absolutely necessary, avoid CT with contrast and/or MRI with gadolinium.      Hyponatremia: Mild, stable.  Likely due to volume overload state of heart failure.  Should improve with CRRT  Hypophosphatemia: Due to CRRT.  On replacement protocol.   AoC BiV HF; New onset RV failure: Pressors inotropes per primary; CRRT as above.  Rev'd cardiology notes - do not think additional improvement will be seen , recommending comfort care.    Atrial fibrillation: On amiodarone drip.  S/p cardioversion 10/5.  Remains in sinus rhythm.    CAD: On atorvastatin   D/w RN bedside - no issues with CRRT, continue for now pending GOC discussions  Manuelita DELENA Barters [facility] 06/26/2024 8:16 AM  ___________________________________________________________  CC: Chest pain  Interval History/Subjective:  Remains in ST. Remains anuric.  CRRT UF 2.2L net neg 14.6L for admit.   Remains on epi, milrinine, NE, vaso with BPs in the 90/50s this AM.  Cardiology recommending pall care/comfort care.   Medications:  Current Facility-Administered Medications  Medication Dose Route Frequency Provider  Last Rate Last Admin   acetaminophen  (TYLENOL ) tablet 650 mg  650 mg Oral Q6H PRN Patel, Vishal R, MD   650 mg at 06/25/24 2023   Or   acetaminophen  (TYLENOL ) suppository 650 mg  650 mg Rectal Q6H PRN Patel, Vishal R, MD       albuterol  (PROVENTIL ) (2.5 MG/3ML) 0.083% nebulizer solution 2.5 mg  2.5 mg Nebulization Q6H PRN Patel, Vishal R, MD   2.5 mg at 06/18/24 0143   amiodarone (NEXTERONE PREMIX) 360-4.14 MG/200ML-% (1.8 mg/mL) IV infusion  60 mg/hr Intravenous Continuous Lee, [place], NP 33.3 mL/hr at 06/26/24 0800 60 mg/hr at 06/26/24 0800   atorvastatin  (LIPITOR ) tablet 80 mg  80 mg Oral Daily Patel, Vishal R, MD   80 mg at 06/25/24 9093   bisacodyl (DULCOLAX) EC tablet 5 mg  5 mg Oral Daily PRN Patel, Vishal R, MD       calcium  carbonate (TUMS - dosed in mg elemental calcium ) chewable tablet 200 mg of elemental calcium   200 mg of elemental calcium  Oral TID PRN Bensimhon, Daniel R, MD   200 mg of elemental calcium  at 06/22/24 0947   Chlorhexidine  Gluconate Cloth 2 % PADS 6 each  6 each Topical Daily Lee, [place], NP   6 each at 06/25/24 0906   feeding supplement (ENSURE PLUS HIGH PROTEIN) liquid 237 mL  237 mL Oral TID BM Bensimhon, Daniel R, MD   237 mL at 06/25/24 0905   heparin  ADULT infusion 100 units/mL (25000 units/250mL)  1,050 Units/hr Intravenous Continuous Serena Morna SAUNDERS, RPH 10.5 mL/hr at 06/26/24 0800 1,050 Units/hr at 06/26/24 0800   heparin  injection 1,000-6,000 Units  1,000-6,000 Units CRRT  PRN Darden, Timothy M, DO   2,800 Units at 06/19/24 1711   Influenza vac split trivalent PF (FLUZONE HIGH-DOSE) injection 0.5 mL  0.5 mL Intramuscular Prior to discharge Zenaida Morene PARAS, MD       insulin  aspart (novoLOG ) injection 0-20 Units  0-20 Units Subcutaneous TID WC Colletta Manuelita Garre, PA-C   4 Units at 06/26/24 9352   insulin  aspart (novoLOG ) injection 0-5 Units  0-5 Units Subcutaneous QHS Colletta Manuelita Garre, PA-C   2 Units at 06/25/24 2208   insulin  glargine (LANTUS )  injection 10 Units  10 Units Subcutaneous Daily Clegg, Amy D, NP   10 Units at 06/25/24 1149   levothyroxine  (SYNTHROID ) tablet 50 mcg  50 mcg Oral Q0600 Patel, Vishal R, MD   50 mcg at 06/26/24 0505   loperamide (IMODIUM) capsule 2 mg  2 mg Oral Q6H PRN Colletta Manuelita Garre, PA-C   2 mg at 06/22/24 1054   midodrine (PROAMATINE) tablet 15 mg  15 mg Oral Q8H Stoner, Benjamin J, MD       milrinone (PRIMACOR) 20 MG/100 ML (0.2 mg/mL) infusion  0.125 mcg/kg/min Intravenous Continuous Zenaida Morene PARAS, MD 3.51 mL/hr at 06/26/24 0800 0.125 mcg/kg/min at 06/26/24 0800   multivitamin (RENA-VIT) tablet 1 tablet  1 tablet Oral QHS Bensimhon, Daniel R, MD   1 tablet at 06/25/24 2208   norepinephrine  (LEVOPHED ) 16 mg in (0.064 mg/mL) premix infusion  0-20 mcg/min Intravenous Titrated Colletta Manuelita Garre, PA-C 16.88 mL/hr at 06/26/24 0800 18 mcg/min at 06/26/24 0800   ondansetron  (ZOFRAN ) tablet 4 mg  4 mg Oral Q6H PRN Patel, Vishal R, MD       Or   ondansetron  (ZOFRAN ) injection 4 mg  4 mg Intravenous Q6H PRN Patel, Vishal R, MD   4 mg at 06/22/24 0602   Oral care mouth rinse  15 mL Mouth Rinse PRN Bensimhon, Toribio SAUNDERS, MD       potassium PHOSPHATE 30 mmol in dextrose  5 % 500 mL infusion  30 mmol Intravenous Once Marlee Bernardino NOVAK, MD       prismasol BGK 4/2.5 infusion   CRRT Continuous Darden, Timothy M, DO 400 mL/hr at 06/25/24 2331 New Bag at 06/25/24 2331   prismasol BGK 4/2.5 infusion   CRRT Continuous Windle Evalene HERO, DO 400 mL/hr at 06/26/24 0602 New Bag at 06/26/24 0602   prismasol BGK 4/2.5 infusion   CRRT Continuous Darden, Timothy M, DO 1,500 mL/hr at 06/26/24 0602 New Bag at 06/26/24 0602   scopolamine  (TRANSDERM-SCOP) 1 MG/3DAYS 1 mg  1 patch Transdermal Q72H Sherryll Suzen SQUIBB, RPH   1 mg at 06/23/24 9068   senna-docusate (Senokot-S) tablet 1 tablet  1 tablet Oral QHS PRN Tobie Jorie SAUNDERS, MD       sodium chloride  0.9 % primer fluid for CRRT   CRRT PRN Darden, Timothy M, DO       sodium  chloride flush (NS) 0.9 % injection 10-40 mL  10-40 mL Intracatheter Q12H Bensimhon, Toribio SAUNDERS, MD   10 mL at 06/25/24 0905   sodium chloride  flush (NS) 0.9 % injection 10-40 mL  10-40 mL Intracatheter PRN Bensimhon, Toribio SAUNDERS, MD       sodium chloride  flush (NS) 0.9 % injection 3 mL  3 mL Intravenous Q12H Tobie Jorie R, MD   3 mL at 06/25/24 0905   sodium chloride  flush (NS) 0.9 % injection 3 mL  3 mL Intravenous Q12H Lee, [place], NP   3 mL at 06/25/24  9094   sodium chloride  flush (NS) 0.9 % injection 3 mL  3 mL Intravenous PRN Lee, [place], NP       trimethobenzamide PHYLLISTINE) injection 200 mg  200 mg Intramuscular Q6H PRN Lee, [place], NP       vasopressin (PITRESSIN) 20 Units in 100 mL (0.2 unit/mL) infusion-*FOR SHOCK*  0-0.03 Units/min Intravenous Continuous Lee, [place], NP 9 mL/hr at 06/26/24 0800 0.03 Units/min at 06/26/24 0800      Review of Systems: 10 systems reviewed and negative except per interval history/subjective  Physical Exam: Vitals:   06/26/24 0805 06/26/24 0815  BP: 97/60   Pulse: 94 91  Resp: (!) 21 20  Temp: 98.6 F (37 C) 98.8 F (37.1 C)  SpO2: 97% 90%   Total I/O In: 556 [P.O.:480; I.V.:76] Out: 299   Intake/Output Summary (Last 24 hours) at 06/26/2024 0816 Last data filed at 06/26/2024 0800 Gross per 24 hour  Intake 3744.14 ml  Output 5691 ml  Net -1946.86 ml   General: well-appearing, no acute distress, on 4LNC CV: ST on monitor, trace peripheral edema Lungs: Bilateral breath, normal work of breathing Abd: soft, non-tender, non-distended Skin: no visible lesions or rashes Psych: Alert Musculoskeletal: no obvious deformities LIJ c/d/i  Test Results I personally reviewed new and old clinical labs and radiology tests Lab Results  Component Value Date   NA 132 (L) 06/26/2024   K 4.1 06/26/2024   CL 100 06/26/2024   CO2 24 06/26/2024   BUN 8 06/26/2024   CREATININE 1.27 (H) 06/26/2024   CALCIUM  8.1 (L) 06/26/2024   ALBUMIN 2.9 (L) 06/26/2024    PHOS 1.6 (L) 06/26/2024    CBC Recent Labs  Lab 06/24/24 0400 06/25/24 0500 06/26/24 0444  WBC 7.4 6.6 6.7  HGB 9.7* 9.5* 9.8*  HCT 28.8* 29.0* 29.5*  MCV 83.7 84.5 84.3  PLT 84* 105* 95*

## 2024-06-26 NOTE — Plan of Care (Signed)
  Problem: Coping: Goal: Ability to adjust to condition or change in health will improve Outcome: Progressing   Problem: Fluid Volume: Goal: Ability to maintain a balanced intake and output will improve Outcome: Progressing   Problem: Health Behavior/Discharge Planning: Goal: Ability to identify and utilize available resources and services will improve Outcome: Progressing Goal: Ability to manage health-related needs will improve Outcome: Progressing   Problem: Metabolic: Goal: Ability to maintain appropriate glucose levels will improve Outcome: Progressing   Problem: Nutritional: Goal: Maintenance of adequate nutrition will improve Outcome: Progressing Goal: Progress toward achieving an optimal weight will improve Outcome: Progressing   Problem: Skin Integrity: Goal: Risk for impaired skin integrity will decrease Outcome: Progressing   Problem: Tissue Perfusion: Goal: Adequacy of tissue perfusion will improve Outcome: Progressing   Problem: Education: Goal: Ability to demonstrate management of disease process will improve Outcome: Progressing Goal: Ability to verbalize understanding of medication therapies will improve Outcome: Progressing   Problem: Activity: Goal: Capacity to carry out activities will improve Outcome: Progressing   Problem: Cardiac: Goal: Ability to achieve and maintain adequate cardiopulmonary perfusion will improve Outcome: Progressing   Problem: Education: Goal: Knowledge of General Education information will improve Description: Including pain rating scale, medication(s)/side effects and non-pharmacologic comfort measures Outcome: Progressing   Problem: Health Behavior/Discharge Planning: Goal: Ability to manage health-related needs will improve Outcome: Progressing   Problem: Clinical Measurements: Goal: Ability to maintain clinical measurements within normal limits will improve Outcome: Progressing Goal: Will remain free from  infection Outcome: Progressing Goal: Diagnostic test results will improve Outcome: Progressing Goal: Respiratory complications will improve Outcome: Progressing Goal: Cardiovascular complication will be avoided Outcome: Progressing   Problem: Activity: Goal: Risk for activity intolerance will decrease Outcome: Progressing   Problem: Nutrition: Goal: Adequate nutrition will be maintained Outcome: Progressing   Problem: Coping: Goal: Level of anxiety will decrease Outcome: Progressing   Problem: Elimination: Goal: Will not experience complications related to bowel motility Outcome: Progressing Goal: Will not experience complications related to urinary retention Outcome: Progressing   Problem: Pain Managment: Goal: General experience of comfort will improve and/or be controlled Outcome: Progressing   Problem: Safety: Goal: Ability to remain free from injury will improve Outcome: Progressing   Problem: Skin Integrity: Goal: Risk for impaired skin integrity will decrease Outcome: Progressing   Problem: Education: Goal: Understanding of CV disease, CV risk reduction, and recovery process will improve Outcome: Progressing   Problem: Activity: Goal: Ability to return to baseline activity level will improve Outcome: Progressing   Problem: Cardiovascular: Goal: Ability to achieve and maintain adequate cardiovascular perfusion will improve Outcome: Progressing Goal: Vascular access site(s) Level 0-1 will be maintained Outcome: Progressing   Problem: Health Behavior/Discharge Planning: Goal: Ability to safely manage health-related needs after discharge will improve Outcome: Progressing

## 2024-06-26 NOTE — TOC Progression Note (Signed)
 Transition of Care [facility]) - Progression Note    Patient Details  Name: [name] MRN: [id number] Date of Birth: [birth year]  [facility] of [facility] CM/SW Contact  [name] Phone Number: [phone] 06/26/2024, 10:02 AM  Clinical Narrative:  [facility] CSW faxed the patient out to different SNFs. Patient and family prefer [facility]F. CIR is considering the patient at this time.   [facility] CSW/CM will continue to follow and monitor for dc readiness.      Expected Discharge Plan: Skilled Nursing Facility Barriers to Discharge: Continued Medical Work up               Expected Discharge Plan and Services   Discharge Planning Services: CM Consult Post Acute Care Choice: Skilled Nursing Facility Living arrangements for the past 2 months: Single Family Home                                       Social Drivers of Health (SDOH) Interventions SDOH Screenings   Food Insecurity: No Food Insecurity (06/18/2024)  Recent Concern: Food Insecurity - Food Insecurity Present (04/23/2024)  Housing: Low Risk  (06/18/2024)  Transportation Needs: No Transportation Needs (06/18/2024)  Utilities: Not At Risk (06/18/2024)  Alcohol Screen: Low Risk  (03/21/2024)  Depression (PHQ2-9): Low Risk  (04/23/2024)  Financial Resource Strain: Low Risk  (03/21/2024)  Physical Activity: Sufficiently Active (03/21/2024)  Social Connections: Moderately Integrated (06/18/2024)  Stress: No Stress Concern Present (03/21/2024)  Tobacco Use: Medium Risk (06/17/2024)  Health Literacy: Adequate Health Literacy (03/21/2024)    Readmission Risk Interventions     No data to display

## 2024-06-26 NOTE — Progress Notes (Signed)
   Inpatient Rehabilitation Admissions Coordinator   Noted Heart Failure Team recommendations. I will not pursue CIR admit at this time. We will sign off.  Heron Leavell, RN, MSN Rehab Admissions Coordinator [phone] 06/26/2024 2:08 PM

## 2024-06-26 NOTE — [place] FL2 (Signed)
 [place]  MEDICAID FL2 LEVEL OF CARE FORM     IDENTIFICATION  Patient Name: [name] Birthdate: [birth year] Sex: female Admission Date (Current Location): 06/17/2024  [facility] and [place] Number:  Producer, television/film/video and Address:  The . [facility], [address] [place], [place] [address]      Provider Number: [id number]  Attending Physician Name and Address:  Zenaida Morene PARAS, MD  Relative Name and Phone Number:  [place], [facility]) [phone]    Current Level of Care: Hospital Recommended Level of Care: Skilled Nursing Facility Prior Approval Number:    Date Approved/Denied:   PASRR Number: [id number]  Discharge Plan: SNF    Current Diagnoses: Patient Active Problem List   Diagnosis Date Noted   Malnutrition of moderate degree 06/22/2024   Hypotension 06/18/2024   AKI (acute kidney injury) 06/18/2024   Acute on chronic congestive heart failure (HCC) 06/18/2024   Acute on chronic heart failure with mildly reduced ejection fraction (HFmrEF, 41-49%) (HCC) 06/17/2024   Persistent atrial fibrillation (HCC) 06/12/2024   Acute on chronic combined systolic and diastolic congestive heart failure (HCC) 12/13/2023   Hospital discharge follow-up 12/13/2023   Dyspnea 12/13/2023   Influenza 11/17/2023   Type 2 diabetes mellitus with hyperglycemia, with long-term current use of insulin  (HCC) 08/04/2023   Acute mucoid otitis media of both ears 08/04/2023   Hyperlipidemia associated with type 2 diabetes mellitus (HCC) 06/17/2023   URTI (acute upper respiratory infection) 09/27/2022   Gait abnormality 09/27/2022   OSA (obstructive sleep apnea) 05/01/2020   Obesity 05/01/2020   Hypothyroidism 05/01/2020   Cardiomyopathy, ischemic 04/04/2014   Hypertension associated with diabetes (HCC) 04/04/2014   Chronic combined systolic and diastolic CHF (congestive heart failure) (HCC) 04/04/2014   Coronary artery disease 03/31/2014   Type 2 diabetes mellitus  with diabetic neuropathy (HCC) 03/31/2014   DCM (dilated cardiomyopathy) (HCC) 03/29/2014   Anemia 03/26/2014   Cardiogenic shock (HCC) 03/23/2014   Consolidation of right lower lobe of lung 03/23/2014   Gastroparesis 02/14/2014   Chronic constipation 11/30/2012   GERD (gastroesophageal reflux disease) 11/30/2012   Fatty liver 11/30/2012    Orientation RESPIRATION BLADDER Height & Weight     Self, Time, Situation, Place  O2 Continent Weight: 186 lb 15.2 oz (84.8 kg) Height:  5' 5 (165.1 cm)  BEHAVIORAL SYMPTOMS/MOOD NEUROLOGICAL BOWEL NUTRITION STATUS      Incontinent Diet (See dc summary)  AMBULATORY STATUS COMMUNICATION OF NEEDS Skin     Verbally PU Stage and Appropriate Care (Wound 06/18/24 1100 [address])                       Personal Care Assistance Level of Assistance  Bathing, Feeding, Dressing Bathing Assistance: Limited assistance Feeding assistance: Limited assistance Dressing Assistance: Limited assistance     Functional Limitations Info  Sight, Hearing, Speech Sight Info: Adequate Hearing Info: Adequate Speech Info: Adequate    SPECIAL CARE FACTORS FREQUENCY  PT (By licensed PT), OT (By licensed OT)     PT Frequency: 5x weekly OT Frequency: 5x weekly            Contractures      Additional Factors Info  Code Status, Allergies Code Status Info: Full Allergies Info: Fentanyl ; Lisinopril ;FLAGYL , CIPRO , PROTONIX  taken at the same time caused patient to lose her breath and have trouble breathing           Current Medications (06/26/2024):  This is the current hospital active medication list  Current Facility-Administered Medications  Medication Dose Route Frequency Provider Last Rate Last Admin   acetaminophen  (TYLENOL ) tablet 650 mg  650 mg Oral Q6H PRN Patel, Vishal R, MD   650 mg at 06/25/24 2023   Or   acetaminophen  (TYLENOL ) suppository 650 mg  650 mg Rectal Q6H PRN Patel, Vishal R, MD       albuterol  (PROVENTIL ) (2.5 MG/3ML) 0.083%  nebulizer solution 2.5 mg  2.5 mg Nebulization Q6H PRN Patel, Vishal R, MD   2.5 mg at 06/18/24 0143   amiodarone (NEXTERONE PREMIX) 360-4.14 MG/200ML-% (1.8 mg/mL) IV infusion  60 mg/hr Intravenous Continuous Lee, [place], NP 33.3 mL/hr at 06/26/24 0900 60 mg/hr at 06/26/24 0900   atorvastatin  (LIPITOR ) tablet 80 mg  80 mg Oral Daily Patel, Vishal R, MD   80 mg at 06/26/24 9077   bisacodyl (DULCOLAX) EC tablet 5 mg  5 mg Oral Daily PRN Patel, Vishal R, MD       calcium  carbonate (TUMS - dosed in mg elemental calcium ) chewable tablet 200 mg of elemental calcium   200 mg of elemental calcium  Oral TID PRN Bensimhon, Daniel R, MD   200 mg of elemental calcium  at 06/22/24 0947   Chlorhexidine  Gluconate Cloth 2 % PADS 6 each  6 each Topical Daily Lee, [place], NP   6 each at 06/26/24 0921   feeding supplement (ENSURE PLUS HIGH PROTEIN) liquid 237 mL  237 mL Oral TID BM Bensimhon, Toribio SAUNDERS, MD   237 mL at 06/26/24 0922   heparin  ADULT infusion 100 units/mL (25000 units/250mL)  1,050 Units/hr Intravenous Continuous Katsaros, Lindsey R, RPH 10.5 mL/hr at 06/26/24 0900 1,050 Units/hr at 06/26/24 0900   heparin  injection 1,000-6,000 Units  1,000-6,000 Units CRRT PRN Windle Evalene HERO, DO   2,800 Units at 06/19/24 1711   Influenza vac split trivalent PF (FLUZONE HIGH-DOSE) injection 0.5 mL  0.5 mL Intramuscular Prior to discharge Zenaida Morene PARAS, MD       insulin  aspart (novoLOG ) injection 0-20 Units  0-20 Units Subcutaneous TID WC Colletta Manuelita Garre, PA-C   4 Units at 06/26/24 9352   insulin  aspart (novoLOG ) injection 0-5 Units  0-5 Units Subcutaneous QHS Colletta Manuelita Garre, PA-C   2 Units at 06/25/24 2208   insulin  glargine (LANTUS ) injection 18 Units  18 Units Subcutaneous Daily Clegg, Amy D, NP   18 Units at 06/26/24 0948   levothyroxine  (SYNTHROID ) tablet 50 mcg  50 mcg Oral Q0600 Patel, Vishal R, MD   50 mcg at 06/26/24 0505   loperamide (IMODIUM) capsule 2 mg  2 mg Oral Q6H PRN Colletta Manuelita Garre,  PA-C   2 mg at 06/22/24 1054   midodrine (PROAMATINE) tablet 15 mg  15 mg Oral Q8H Stoner, Benjamin J, MD   15 mg at 06/26/24 0847   milrinone (PRIMACOR) 20 MG/100 ML (0.2 mg/mL) infusion  0.125 mcg/kg/min Intravenous Continuous Zenaida Morene PARAS, MD 3.51 mL/hr at 06/26/24 0900 0.125 mcg/kg/min at 06/26/24 0900   multivitamin (RENA-VIT) tablet 1 tablet  1 tablet Oral QHS Bensimhon, Toribio SAUNDERS, MD   1 tablet at 06/25/24 2208   norepinephrine  (LEVOPHED ) 16 mg in 250mL (0.064 mg/mL) premix infusion  0-20 mcg/min Intravenous Titrated Colletta Manuelita Garre, PA-C 15.94 mL/hr at 06/26/24 0900 17 mcg/min at 06/26/24 0900   ondansetron  (ZOFRAN ) tablet 4 mg  4 mg Oral Q6H PRN Patel, Vishal R, MD       Or   ondansetron  (ZOFRAN ) injection 4 mg  4 mg Intravenous Q6H PRN Tobie,  Vishal R, MD   4 mg at 06/22/24 0602   Oral care mouth rinse  15 mL Mouth Rinse PRN Bensimhon, Toribio SAUNDERS, MD       potassium PHOSPHATE 30 mmol in dextrose  5 % 500 mL infusion  30 mmol Intravenous Once Marlee Bernardino NOVAK, MD 85 mL/hr at 06/26/24 0903 30 mmol at 06/26/24 0903   prismasol BGK 4/2.5 infusion   CRRT Continuous Darden, Timothy M, DO 400 mL/hr at 06/25/24 2331 New Bag at 06/25/24 2331   prismasol BGK 4/2.5 infusion   CRRT Continuous Windle Evalene HERO, DO 400 mL/hr at 06/26/24 0602 New Bag at 06/26/24 0602   prismasol BGK 4/2.5 infusion   CRRT Continuous Windle Evalene HERO, DO 1,500 mL/hr at 06/26/24 0909 New Bag at 06/26/24 0909   scopolamine  (TRANSDERM-SCOP) 1 MG/3DAYS 1 mg  1 patch Transdermal Q72H Sherryll Suzen SQUIBB, RPH   1 mg at 06/26/24 9045   senna-docusate (Senokot-S) tablet 1 tablet  1 tablet Oral QHS PRN Tobie Jorie SAUNDERS, MD       sodium chloride  0.9 % primer fluid for CRRT   CRRT PRN Darden, Timothy M, DO       sodium chloride  flush (NS) 0.9 % injection 10-40 mL  10-40 mL Intracatheter Q12H Bensimhon, Toribio SAUNDERS, MD   10 mL at 06/25/24 0905   sodium chloride  flush (NS) 0.9 % injection 10-40 mL  10-40 mL Intracatheter PRN  Bensimhon, Toribio SAUNDERS, MD       sodium chloride  flush (NS) 0.9 % injection 3 mL  3 mL Intravenous Q12H Tobie Jorie R, MD   3 mL at 06/25/24 0905   sodium chloride  flush (NS) 0.9 % injection 3 mL  3 mL Intravenous Q12H Lee, [place], NP   3 mL at 06/25/24 0905   sodium chloride  flush (NS) 0.9 % injection 3 mL  3 mL Intravenous PRN Lee, Jordan, NP       trimethobenzamide PHYLLISTINE) injection 200 mg  200 mg Intramuscular Q6H PRN Lee, Jordan, NP       vasopressin (PITRESSIN) 20 Units in 100 mL (0.2 unit/mL) infusion-*FOR SHOCK*  0-0.03 Units/min Intravenous Continuous Lee, [place], NP 9 mL/hr at 06/26/24 0900 0.03 Units/min at 06/26/24 0900     Discharge Medications: Please see discharge summary for a list of discharge medications.  Relevant Imaging Results:  Relevant Lab Results:   Additional Information SSN: [id number]  Arlana JINNY Moats, LCSWA

## 2024-06-26 NOTE — Consult Note (Signed)
 Consultation Note Date: 06/26/2024   Patient Name: [name]  DOB: 28-Jul-1951  MRN: [id number]  Age / Sex: 73 y.o., female  PCP: Christina Suzzane POUR, MD Referring Physician: Zenaida Morene PARAS, MD  Reason for Consultation: Establishing goals of care  HPI/Patient Profile: 73 y.o. female   admitted on 06/17/2024 with   medical history significant for CAD, persistent atrial fibrillation on Eliquis , chronic HFmrEF s/p CRT-D, LBBB, T2DM, HTN, HLD, CKD stage IIIa, hypothyroidism, OSA who presented to the ED for evaluation of chest pain, dyspnea, cough, increased swelling bilateral lower extremities   Patient remains on CRRT/per nephrology would not be able to tolerate HD at this point and on multiple pressors.   CIR not being recommended at this time  Patient and her family face treatment option decisions, advanced directive decisions and anticipatory care needs.  Clinical Assessment and Goals of Care:  This NP [name] reviewed medical records, received report from team, assessed the patient and then meet at the patient's bedside /along with her son [name] and his wife/[name] to discuss diagnosis, prognosis, GOC, EOL wishes disposition and options.   Concept of Palliative Care was introduced as specialized medical care for people and their families living with serious illness.  If focuses on providing relief from the symptoms and stress of a serious illness.  The goal is to improve quality of life for both the patient and the family. Values and goals of care important to patient and family were attempted to be elicited.  Created space and opportunity for patient  and family to explore thoughts and feelings regarding current medical situation.  Patient verbalizes somewhat of surprise when discussing her serious multiple comorbidities specific to heart failure and renal disease.  She verbalizes to me and  her son that today is the first time that she is hearing this information, it is causing her to feel discouraged.  Emotional support offered and questions addressed.  Son verbalizes an understanding of the seriousness but up until today he was taking it 1 day at a time.  He now is recognizing the difficult decisions that his mother is facing specific to possibility of dialysis and increasing personal care needs.     A  discussion was had today regarding advanced directives.   MOST form introduced and Hard choices booklet left for review  Concepts specific to code status, artifical feeding and hydration, continued IV antibiotics and rehospitalization was had.      (Educated offered to patient/family to consider DNR/DNI status understanding evidenced based poor outcomes in similar hospitalized patient, as the cause of arrest is likely associated with advanced chronic illness rather than an easily reversible acute cardio-pulmonary event.)    (Patient is interested in securing H POA, she wishes her son to be named H POA, I will consult spiritual care)   The difference between a aggressive medical intervention path  and a palliative comfort care path for this patient at this time was had.   Education offered on hospice benefit; philosophy and eligibility  For now patient and family are open to all offered and available medical interventions to prolong life.  All are hopeful for improvement.  They need time for outcomes   Questions and concerns addressed.  Patient  encouraged to call with questions or concerns.     PMT will continue to support holistically.          No documented H POA or advanced care planning documents noted.   Again patient wishes for her son [name] to be named H POA.  Spiritual care will assist with this documentation      SUMMARY OF RECOMMENDATIONS    Code Status/Advance Care Planning: Full code Patient verbalizes that she would consider dialysis if  offered   Palliative Prophylaxis:  Bowel Regimen, Delirium Protocol, and Frequent Pain Assessment  Additional Recommendations (Limitations, Scope, Preferences): Avoid Hospitalization  Psycho-social/Spiritual:  Desire for further Chaplaincy support:yes Additional Recommendations: Emotional support offered  Prognosis:  Unable to determine  Discharge Planning: To Be Determined      Primary Diagnoses: Present on Admission:  Acute on chronic heart failure with mildly reduced ejection fraction (HFmrEF, 41-49%) (HCC)  Consolidation of right lower lobe of lung  Persistent atrial fibrillation (HCC)  Hypothyroidism  OSA (obstructive sleep apnea)  Hypertension associated with diabetes (HCC)  Hyperlipidemia associated with type 2 diabetes mellitus (HCC)  Coronary artery disease  Hypotension  Cardiogenic shock (HCC)   I have reviewed the medical record, interviewed the patient and family, and examined the patient. The following aspects are pertinent.  Past Medical History:  Diagnosis Date   A-fib (HCC) 06/12/2024   Anemia    Arthritis    CAD (coronary artery disease)    a. LHC (03/2014) Lmain: nl, LAD: diff dz proximal 20%, 30-40% dz mid vessel prior 2nd diagonal, LCx: 40% in OM1, 20-30% in OM2, RCA: 99% subtotal occlusion @ crux, TIMI 1 flow, L-R collaterals to distal vessel   Cardiomyopathy (HCC) 03/28/2014   LV dysfunction out of proportion to CAD 03/28/14   CHF (congestive heart failure) (HCC)    Phreesia 10/03/2020   Chronic systolic heart failure (HCC)    a. ECHO (03/2014): EF 25-30%, akinesis enteroanteroseptal myocardium, grade III DD b. RHC (03/2014) RA 4, RV 42/4, PA 44/14 (26), PCWP 21, PA 62% Fick CO/CI 6.9 / 3.4   Diabetes mellitus    Diabetes mellitus without complication (HCC)    Phreesia 10/03/2020   Duodenal ulcer    Age 67   Dyspnea    Erosive esophagitis    Gastritis    Hypertension    Hypothyroidism    Obese    Short-term memory loss    Thyroid  disease     TTP (thrombotic thrombocytopenic purpura) (HCC) 06/2005   Social History   Socioeconomic History   Marital status: Single    Spouse name: Not on file   Number of children: 3   Years of education: 12   Highest education level: Some college, no degree  Occupational History   Occupation: disabled    Associate Professor: NOT EMPLOYED  Tobacco Use   Smoking status: Former    Current packs/day: 0.00    Average packs/day: 0.3 packs/day for 12.0 years (3.0 ttl pk-yrs)    Types: Cigarettes    Start date: 09/20/1966    Quit date: 09/20/1978    Years since quitting: 45.7   Smokeless tobacco: Never   Tobacco comments:    Former smoker 05/15/24  Vaping Use   Vaping status: Never Used  Substance and Sexual Activity  Alcohol use: No    Alcohol/week: 0.0 standard drinks of alcohol   Drug use: No   Sexual activity: Not Currently  Other Topics Concern   Not on file  Social History Narrative   Lives Alone       Social Drivers of Health   Financial Resource Strain: Low Risk  (03/21/2024)   Overall Financial Resource Strain (CARDIA)    Difficulty of Paying Living Expenses: Not hard at all  Food Insecurity: No Food Insecurity (06/18/2024)   Hunger Vital Sign    Worried About Running Out of Food in the Last Year: Never true    Ran Out of Food in the Last Year: Never true  Recent Concern: Food Insecurity - Food Insecurity Present (04/23/2024)   Hunger Vital Sign    Worried About Running Out of Food in the Last Year: Never true    Ran Out of Food in the Last Year: Sometimes true  Transportation Needs: No Transportation Needs (06/18/2024)   PRAPARE - Administrator, Civil Service (Medical): No    Lack of Transportation (Non-Medical): No  Physical Activity: Sufficiently Active (03/21/2024)   Exercise Vital Sign    Days of Exercise per Week: 7 days    Minutes of Exercise per Session: 30 min  Stress: No Stress Concern Present (03/21/2024)   [employer] of Occupational Health - Occupational  Stress Questionnaire    Feeling of Stress: Not at all  Social Connections: Moderately Integrated (06/18/2024)   Social Connection and Isolation Panel    Frequency of Communication with Friends and Family: More than three times a week    Frequency of Social Gatherings with Friends and Family: Twice a week    Attends Religious Services: More than 4 times per year    Active Member of [employer] or Organizations: Yes    Attends Engineer, structural: More than 4 times per year    Marital Status: Divorced   Family History  Problem Relation Age of Onset   Heart failure Mother    Cirrhosis Father        ETOH   Heart disease Brother    Diabetes Daughter    Breast cancer Maternal Aunt    Colon cancer Neg Hx    Scheduled Meds:  atorvastatin   80 mg Oral Daily   Chlorhexidine  Gluconate Cloth  6 each Topical Daily   feeding supplement  237 mL Oral TID BM   insulin  aspart  0-20 Units Subcutaneous TID WC   insulin  aspart  0-5 Units Subcutaneous QHS   insulin  glargine  10 Units Subcutaneous Daily   levothyroxine   50 mcg Oral Q0600   multivitamin  1 tablet Oral QHS   scopolamine   1 patch Transdermal Q72H   sodium chloride  flush  10-40 mL Intracatheter Q12H   sodium chloride  flush  3 mL Intravenous Q12H   sodium chloride  flush  3 mL Intravenous Q12H   Continuous Infusions:  amiodarone 60 mg/hr (06/26/24 0700)   heparin  1,050 Units/hr (06/26/24 0700)   milrinone 0.25 mcg/kg/min (06/26/24 0700)   norepinephrine  (LEVOPHED ) Adult infusion 19 mcg/min (06/26/24 0700)   potassium PHOSPHATE IVPB (in mmol)     prismasol BGK 4/2.5 400 mL/hr at 06/25/24 2331   prismasol BGK 4/2.5 400 mL/hr at 06/26/24 0602   prismasol BGK 4/2.5 1,500 mL/hr at 06/26/24 0602   vasopressin 0.03 Units/min (06/26/24 0700)   PRN Meds:.acetaminophen  **OR** acetaminophen , albuterol , bisacodyl, calcium  carbonate, heparin , Influenza vac split trivalent PF, loperamide, ondansetron  **OR** ondansetron  (ZOFRAN )  IV, mouth rinse,  senna-docusate, sodium chloride , sodium chloride  flush, sodium chloride  flush, trimethobenzamide Medications Prior to Admission:  Prior to Admission medications   Medication Sig Start Date End Date Taking? Authorizing Provider  acetaminophen  (TYLENOL ) 500 MG tablet Take 1,000 mg by mouth every 6 (six) hours as needed for moderate pain. Patient taking differently: Take 1,000 mg by mouth as needed for moderate pain (pain score 4-6).   Yes [provider]  albuterol  (PROVENTIL ) (2.5 MG/3ML) 0.083% nebulizer solution INHALE THE CONTENTS OF 1 VIAL VIA NEBULIZER EVERY 6 HOURS AS NEEDED FOR SHORTNESS OF BREATH OR WHEEZING 06/04/24  Yes Christina Suzzane POUR, MD  apixaban  (ELIQUIS ) 5 MG TABS tablet Take 1 tablet (5 mg total) by mouth 2 (two) times daily. 05/15/24  Yes Terra Fairy PARAS, PA-C  atorvastatin  (LIPITOR ) 80 MG tablet TAKE 1 TABLET EVERY DAY 03/02/24  Yes Branch, Dorn FALCON, MD  calcium  carbonate (OSCAL) 1500 (600 Ca) MG TABS tablet Take 1 tablet by mouth daily with breakfast.   Yes [provider]  carvedilol  (COREG ) 6.25 MG tablet Take 1 tablet (6.25 mg total) by mouth 2 (two) times daily. 12/22/23  Yes Branch, Dorn FALCON, MD  furosemide  (LASIX ) 20 MG tablet TAKE 1-2 TABLETS (20-40MG  TOTAL) DAILY AS DIRECTED. ALTERNATE 40MG  WITH 20MG  DAILY (DIRECTIONS CHANGE) 05/16/24  Yes Branch, Dorn FALCON, MD  Glucagon  (GVOKE HYPOPEN  2-PACK) 1 MG/0.2ML SOAJ Inject 1 mg into the skin as needed (Blood glucose < 53). 06/22/23  Yes Christina Suzzane POUR, MD  LANTUS  SOLOSTAR 100 UNIT/ML Solostar Pen INJECT 24 UNITS INTO THE SKIN 2 (TWO) TIMES DAILY. Patient taking differently: Inject 20 Units into the skin 2 (two) times daily. 12/19/23  Yes Christina Suzzane POUR, MD  levothyroxine  (SYNTHROID ) 50 MCG tablet TAKE 1 TABLET EVERY DAY BEFORE BREAKFAST 12/12/23  Yes Patel, Rutwik K, MD  Multiple Vitamin (MULTIVITAMIN) tablet Take 1 tablet by mouth daily.   Yes [provider]  polyethylene glycol (MIRALAX  / GLYCOLAX )  packet Take 17 g by mouth daily as needed for mild constipation.   Yes [provider]  potassium chloride  (KLOR-CON ) 10 MEQ tablet Take 1 tablet (10 mEq total) by mouth daily. 06/12/24  Yes Terra Fairy PARAS, PA-C  spironolactone  (ALDACTONE ) 25 MG tablet TAKE 1 TABLET EVERY DAY 03/02/24  Yes Branch, Dorn FALCON, MD  blood glucose meter kit and supplies Dispense based on patient and insurance preference. Use up to four times daily as directed.(FOR E11.40). 06/22/23   Christina Suzzane POUR, MD  empagliflozin  (JARDIANCE ) 10 MG TABS tablet Take 1 tablet (10 mg total) by mouth daily before breakfast. Patient not taking: Reported on 06/17/2024 01/03/24   Alvan Dorn FALCON, MD  glipiZIDE  (GLUCOTROL ) 5 MG tablet Take 1 tablet (5 mg total) by mouth 2 (two) times daily before a meal. Patient not taking: Reported on 06/17/2024 08/04/23   Christina Suzzane POUR, MD  Insulin  Pen Needle 31G X 5 MM MISC Use as directed for Lantus  and Novolog . 01/31/23   Christina Suzzane POUR, MD  losartan  (COZAAR ) 25 MG tablet TAKE 1 TABLET EVERY DAY Patient not taking: Reported on 06/17/2024 03/02/24   Alvan Dorn FALCON, MD  Semaglutide , 1 MG/DOSE, (OZEMPIC , 1 MG/DOSE,) 4 MG/3ML SOPN INJECT 1MG  UNDER THE SKIN ONE TIME WEEKLY AS DIRECTED Patient not taking: Reported on 06/17/2024 05/14/24   Christina Suzzane POUR, MD  TRUE METRIX BLOOD GLUCOSE TEST test strip TEST BLOOD SUGAR UP TO FOUR TIMES DAILY AS DIRECTED 10/26/23   Christina Suzzane POUR, MD  TRUEplus  Lancets 33G MISC USE AS DIRECTED TO TEST BLOOD SUGAR UP TO FOUR TIMES DAILY 01/09/24   Patel, Rutwik K, MD   Allergies  Allergen Reactions   Other Other (See Comments)    FLAGYL , CIPRO , PROTONIX  taken at the same time caused patient to lose her breath and have trouble breathing, requiring hospital stay at [facility], 03/23/14-per patient.    Fentanyl  Nausea Only    Room was spinning prefers not to take it   Lisinopril  Cough   Review of Systems  Neurological:  Positive for weakness.    Physical  Exam Constitutional:      Appearance: She is underweight. She is ill-appearing.     Interventions: Nasal cannula in place.  Cardiovascular:     Rate and Rhythm: Normal rate.  Pulmonary:     Effort: Pulmonary effort is normal.  Skin:    General: Skin is warm and dry.  Neurological:     Mental Status: She is alert and oriented to person, place, and time.     Vital Signs: BP 115/87   Pulse 95   Temp 98.6 F (37 C)   Resp 17   Ht 5' 5 (1.651 m)   Wt 84.8 kg   SpO2 97%   BMI 31.11 kg/m  Pain Scale: 0-10 POSS *See Group Information*: 1-Acceptable,Awake and alert Pain Score: Asleep   SpO2: SpO2: 97 % O2 Device:SpO2: 97 % O2 Flow Rate: .O2 Flow Rate (L/min): 4 L/min  IO: Intake/output summary:  Intake/Output Summary (Last 24 hours) at 06/26/2024 0724 Last data filed at 06/26/2024 0703 Gross per 24 hour  Intake 3510.97 ml  Output 5492 ml  Net -1981.03 ml    LBM: Last BM Date : 06/25/24 (per day shift RN) Baseline Weight: Weight: 97.8 kg Most recent weight: Weight: 84.8 kg     Palliative Assessment/Data:  40 %     Time: 75 minutes  Signed by: Christina Plants, NP   Please contact Palliative Medicine Team phone at [phone] for questions and concerns.  For individual provider: See Tracey

## 2024-06-26 NOTE — Progress Notes (Signed)
   06/26/24 2300  BiPAP/CPAP/SIPAP  BiPAP/CPAP/SIPAP Pt Type Adult  Reason BIPAP/CPAP not in use Non-compliant

## 2024-06-26 NOTE — Progress Notes (Signed)
 Physical Therapy Treatment Patient Details Name: FINDLEY BLANKENBAKER MRN: [id number] DOB: April 11, 1951 Today's Date: 06/26/2024   History of Present Illness Pt is a 73 y/o F presenting to ED on 9/29 with chest discomfort and SOB. CT chest with R > L pleural effusions, Admitted for acute on chronic CHF and AKI, started on CRRT 9/30. S/p successful DCCV on 10/5. PMH includes CAD, CHF s/p defibrillator, A fib on eliquis , pacemaker, anemia, arthritis, HTN, hypothyroidism, DM, cardiomyopathy.    PT Comments  Eager to work with therapy and get OOB today. Reviewed LE exercises, encouraged performance between visits. CGA with bed mobility and Mod assist to stand with +2 support for lines/leads. Still dizzy upon standing. After sitting BP 95/58 with LEs elevated which was slightly better than BP while lying in bed. Pt comfortable and glad to be up. Will follow and work with pt as tolerated. Patient will continue to benefit from skilled physical therapy services to further improve independence with functional mobility.     If plan is discharge home, recommend the following: A little help with walking and/or transfers;A little help with bathing/dressing/bathroom;Assistance with cooking/housework;Assist for transportation   Can travel by private vehicle        Equipment Recommendations  Rolling walker (2 wheels) (anticipated, will update as she progresses)    Recommendations for Other Services Rehab consult     Precautions / Restrictions Precautions Precautions: Fall Recall of Precautions/Restrictions: Impaired Precaution/Restrictions Comments: Swan, CRRT (shoulder ROM limited <90*), watch BP Restrictions Weight Bearing Restrictions Per Provider Order: No     Mobility  Bed Mobility Overal bed mobility: Needs Assistance Bed Mobility: Supine to Sit     Supine to sit: HOB elevated, Contact guard     General bed mobility comments: CGA for safety with management of lines/leads. Requires extra time  and cues to scoot forward. + dizziness.    Transfers Overall transfer level: Needs assistance Equipment used: 2 person hand held assist Transfers: Sit to/from Stand Sit to Stand: +2 safety/equipment, Mod assist           General transfer comment: Mod assist for boost and balance to stand from edge of bed. +2 for safety and line/lead management. 3rd person to bring chair behind pt. She becomes dizzy immediately upon standing. Unable to remain standing long enough for BP.    Ambulation/Gait               General Gait Details: Deferred, Norva Purl and CRRT   Stairs             Wheelchair Mobility     Tilt Bed    Modified Rankin (Stroke Patients Only)       Balance Overall balance assessment: Needs assistance Sitting-balance support: Feet supported Sitting balance-Leahy Scale: Good     Standing balance support: Bilateral upper extremity supported Standing balance-Leahy Scale: Poor Standing balance comment: BIL UE supported                            Communication Communication Communication: No apparent difficulties  Cognition Arousal: Alert Behavior During Therapy: Flat affect   PT - Cognitive impairments: No apparent impairments                         Following commands: Intact      Cueing Cueing Techniques: Verbal cues  Exercises General Exercises - Lower Extremity Ankle Circles/Pumps: AROM, Both, 10 reps, Supine Quad Sets:  Strengthening, Both, 10 reps, Seated Gluteal Sets: Strengthening, Both, 10 reps, Seated Hip ABduction/ADduction: Strengthening, Both, 10 reps, Seated    General Comments General comments (skin integrity, edema, etc.): Dizzy upon standing. After transfer to chair, LEs elevated, BP 95/58 (higher than starting point.)      Pertinent Vitals/Pain Pain Assessment Pain Assessment: No/denies pain    Home Living                          Prior Function            PT Goals (current goals  can now be found in the care plan section) Acute Rehab PT Goals Patient Stated Goal: Get well go home PT Goal Formulation: With patient Time For Goal Achievement: 07/09/24 Potential to Achieve Goals: Good Progress towards PT goals: Progressing toward goals    Frequency    Min 2X/week      PT Plan      Co-evaluation              AM-PAC PT 6 Clicks Mobility   Outcome Measure  Help needed turning from your back to your side while in a flat bed without using bedrails?: A Little Help needed moving from lying on your back to sitting on the side of a flat bed without using bedrails?: A Little Help needed moving to and from a bed to a chair (including a wheelchair)?: A Lot Help needed standing up from a chair using your arms (e.g., wheelchair or bedside chair)?: A Lot Help needed to walk in hospital room?: A Lot Help needed climbing 3-5 steps with a railing? : Total 6 Click Score: 13    End of Session   Activity Tolerance: Patient tolerated treatment well;Treatment limited secondary to medical complications (Comment) (restricted to transfers only while swan and CRRT in place.) Patient left: in chair;with call bell/phone within reach;with chair alarm set;with nursing/sitter in room Nurse Communication: Mobility status PT Visit Diagnosis: Unsteadiness on feet (R26.81);Other abnormalities of gait and mobility (R26.89);Muscle weakness (generalized) (M62.81);Difficulty in walking, not elsewhere classified (R26.2)     Time: 8466-8451 PT Time Calculation (min) (ACUTE ONLY): 15 min  Charges:    $Therapeutic Activity: 8-22 mins PT General Charges $$ ACUTE PT VISIT: 1 Visit                     [name], PT, DPT [facility]  Rehabilitation Services Physical Therapist Office: [phone] Website: [email or web address]    [name] 06/26/2024, 4:38 PM

## 2024-06-26 NOTE — Plan of Care (Signed)
  Problem: Education: Goal: Ability to describe self-care measures that may prevent or decrease complications (Diabetes Survival Skills Education) will improve Outcome: Progressing Goal: Individualized Educational Video(s) Outcome: Progressing   Problem: Coping: Goal: Ability to adjust to condition or change in health will improve Outcome: Progressing   Problem: Fluid Volume: Goal: Ability to maintain a balanced intake and output will improve Outcome: Progressing   Problem: Health Behavior/Discharge Planning: Goal: Ability to identify and utilize available resources and services will improve Outcome: Progressing Goal: Ability to manage health-related needs will improve Outcome: Progressing   Problem: Metabolic: Goal: Ability to maintain appropriate glucose levels will improve Outcome: Progressing   Problem: Nutritional: Goal: Maintenance of adequate nutrition will improve Outcome: Progressing Goal: Progress toward achieving an optimal weight will improve Outcome: Progressing   Problem: Skin Integrity: Goal: Risk for impaired skin integrity will decrease Outcome: Progressing   Problem: Tissue Perfusion: Goal: Adequacy of tissue perfusion will improve Outcome: Progressing   Problem: Education: Goal: Ability to demonstrate management of disease process will improve Outcome: Progressing Goal: Ability to verbalize understanding of medication therapies will improve Outcome: Progressing Goal: Individualized Educational Video(s) Outcome: Progressing   Problem: Activity: Goal: Capacity to carry out activities will improve Outcome: Progressing   Problem: Cardiac: Goal: Ability to achieve and maintain adequate cardiopulmonary perfusion will improve Outcome: Progressing   Problem: Education: Goal: Knowledge of General Education information will improve Description: Including pain rating scale, medication(s)/side effects and non-pharmacologic comfort measures Outcome:  Progressing   Problem: Health Behavior/Discharge Planning: Goal: Ability to manage health-related needs will improve Outcome: Progressing   Problem: Clinical Measurements: Goal: Ability to maintain clinical measurements within normal limits will improve Outcome: Progressing Goal: Will remain free from infection Outcome: Progressing Goal: Diagnostic test results will improve Outcome: Progressing Goal: Respiratory complications will improve Outcome: Progressing Goal: Cardiovascular complication will be avoided Outcome: Progressing   Problem: Activity: Goal: Risk for activity intolerance will decrease Outcome: Progressing   Problem: Nutrition: Goal: Adequate nutrition will be maintained Outcome: Progressing   Problem: Coping: Goal: Level of anxiety will decrease Outcome: Progressing   Problem: Elimination: Goal: Will not experience complications related to bowel motility Outcome: Progressing Goal: Will not experience complications related to urinary retention Outcome: Progressing   Problem: Pain Managment: Goal: General experience of comfort will improve and/or be controlled Outcome: Progressing   Problem: Safety: Goal: Ability to remain free from injury will improve Outcome: Progressing   Problem: Skin Integrity: Goal: Risk for impaired skin integrity will decrease Outcome: Progressing   Problem: Education: Goal: Understanding of CV disease, CV risk reduction, and recovery process will improve Outcome: Progressing Goal: Individualized Educational Video(s) Outcome: Progressing   Problem: Activity: Goal: Ability to return to baseline activity level will improve Outcome: Progressing   Problem: Cardiovascular: Goal: Ability to achieve and maintain adequate cardiovascular perfusion will improve Outcome: Progressing Goal: Vascular access site(s) Level 0-1 will be maintained Outcome: Progressing   Problem: Health Behavior/Discharge Planning: Goal: Ability to  safely manage health-related needs after discharge will improve Outcome: Progressing

## 2024-06-27 DIAGNOSIS — I509 Heart failure, unspecified: Secondary | ICD-10-CM | POA: Diagnosis not present

## 2024-06-27 DIAGNOSIS — N179 Acute kidney failure, unspecified: Secondary | ICD-10-CM | POA: Diagnosis not present

## 2024-06-27 DIAGNOSIS — Z515 Encounter for palliative care: Secondary | ICD-10-CM | POA: Diagnosis not present

## 2024-06-27 DIAGNOSIS — E8779 Other fluid overload: Secondary | ICD-10-CM | POA: Diagnosis not present

## 2024-06-27 DIAGNOSIS — E871 Hypo-osmolality and hyponatremia: Secondary | ICD-10-CM | POA: Diagnosis not present

## 2024-06-27 DIAGNOSIS — I5023 Acute on chronic systolic (congestive) heart failure: Secondary | ICD-10-CM | POA: Diagnosis not present

## 2024-06-27 DIAGNOSIS — R570 Cardiogenic shock: Secondary | ICD-10-CM | POA: Diagnosis not present

## 2024-06-27 DIAGNOSIS — I5082 Biventricular heart failure: Secondary | ICD-10-CM | POA: Diagnosis not present

## 2024-06-27 DIAGNOSIS — I10 Essential (primary) hypertension: Secondary | ICD-10-CM | POA: Diagnosis not present

## 2024-06-27 DIAGNOSIS — Z7189 Other specified counseling: Secondary | ICD-10-CM | POA: Diagnosis not present

## 2024-06-27 LAB — RENAL FUNCTION PANEL
Albumin: 2.8 g/dL — ABNORMAL LOW (ref 3.5–5.0)
Albumin: 2.6 g/dL — ABNORMAL LOW (ref 3.5–5.0)
Anion gap: 12 (ref 5–15)
Anion gap: 10 (ref 5–15)
BUN: 11 mg/dL (ref 8–23)
BUN: 11 mg/dL (ref 8–23)
CO2: 23 mmol/L (ref 22–32)
CO2: 24 mmol/L (ref 22–32)
Calcium: 8.4 mg/dL — ABNORMAL LOW (ref 8.9–10.3)
Calcium: 8.2 mg/dL — ABNORMAL LOW (ref 8.9–10.3)
Chloride: 95 mmol/L — ABNORMAL LOW (ref 98–111)
Chloride: 98 mmol/L (ref 98–111)
Creatinine, Ser: 1.48 mg/dL — ABNORMAL HIGH (ref 0.44–1.00)
Creatinine, Ser: 1.49 mg/dL — ABNORMAL HIGH (ref 0.44–1.00)
GFR, Estimated: 37 mL/min — ABNORMAL LOW (ref >60)
GFR, Estimated: 36 mL/min — ABNORMAL LOW
Glucose, Bld: 268 mg/dL — ABNORMAL HIGH (ref 70–99)
Glucose, Bld: 217 mg/dL — ABNORMAL HIGH (ref 70–99)
Phosphorus: 2 mg/dL — ABNORMAL LOW (ref 2.5–4.6)
Phosphorus: 1.7 mg/dL — ABNORMAL LOW (ref 2.5–4.6)
Potassium: 4.6 mmol/L (ref 3.5–5.1)
Potassium: 4.4 mmol/L (ref 3.5–5.1)
Sodium: 130 mmol/L — ABNORMAL LOW (ref 135–145)
Sodium: 132 mmol/L — ABNORMAL LOW (ref 135–145)

## 2024-06-27 LAB — CBC
HCT: 31.1 % — ABNORMAL LOW (ref 36.0–46.0)
Hemoglobin: 10.3 g/dL — ABNORMAL LOW (ref 12.0–15.0)
MCH: 27.7 pg (ref 26.0–34.0)
MCHC: 33.1 g/dL (ref 30.0–36.0)
MCV: 83.6 fL (ref 80.0–100.0)
Platelets: 84 K/uL — ABNORMAL LOW (ref 150–400)
RBC: 3.72 MIL/uL — ABNORMAL LOW (ref 3.87–5.11)
RDW: 21.1 % — ABNORMAL HIGH (ref 11.5–15.5)
WBC: 10 K/uL (ref 4.0–10.5)
nRBC: 0.4 % — ABNORMAL HIGH (ref 0.0–0.2)

## 2024-06-27 LAB — GLUCOSE, CAPILLARY
Glucose-Capillary: 229 mg/dL — ABNORMAL HIGH (ref 70–99)
Glucose-Capillary: 253 mg/dL — ABNORMAL HIGH (ref 70–99)
Glucose-Capillary: 187 mg/dL — ABNORMAL HIGH (ref 70–99)
Glucose-Capillary: 143 mg/dL — ABNORMAL HIGH (ref 70–99)

## 2024-06-27 LAB — COOXEMETRY PANEL
Carboxyhemoglobin: 1.2 % (ref 0.5–1.5)
Methemoglobin: <0.7 % (ref 0.0–1.5)
O2 Saturation: 57.7 %
Total hemoglobin: 10.4 g/dL — ABNORMAL LOW (ref 12.0–16.0)

## 2024-06-27 LAB — MAGNESIUM: Magnesium: 2.4 mg/dL (ref 1.7–2.4)

## 2024-06-27 LAB — HEPARIN LEVEL (UNFRACTIONATED): Heparin Unfractionated: 0.32 [IU]/mL (ref 0.30–0.70)

## 2024-06-27 MED ORDER — INSULIN GLARGINE 100 UNIT/ML ~~LOC~~ SOLN
24.0000 [IU] | Freq: Every day | SUBCUTANEOUS | Status: DC
  Filled 2024-06-27: qty 0.24

## 2024-06-27 MED ORDER — SODIUM CHLORIDE 0.9 % IV SOLN: INTRAVENOUS | Status: AC | PRN

## 2024-06-27 MED ORDER — MORPHINE SULFATE (PF) 2 MG/ML IV SOLN: 1.0000 mg | Freq: Once | INTRAVENOUS | Status: DC

## 2024-06-27 MED ORDER — MORPHINE SULFATE (PF) 0.5 MG/ML IJ SOLN: 1.0000 mg | Freq: Once | INTRAMUSCULAR | Status: DC

## 2024-06-27 MED ORDER — SODIUM PHOSPHATES 45 MMOLE/15ML IV SOLN
15.0000 mmol | Freq: Once | INTRAVENOUS | Status: AC
  Administered 2024-06-27: 15 mmol via INTRAVENOUS
  Filled 2024-06-27: qty 5

## 2024-06-27 MED ORDER — INSULIN ASPART 100 UNIT/ML IJ SOLN
4.0000 [IU] | Freq: Three times a day (TID) | INTRAMUSCULAR | Status: DC
  Administered 2024-06-27 – 2024-06-29 (×5): 4 [IU] via SUBCUTANEOUS

## 2024-06-27 NOTE — Progress Notes (Signed)
 Palliative:  HPI: 73 y.o. female   admitted on 06/17/2024 with   medical history significant for CAD, persistent atrial fibrillation on Eliquis , chronic HFmrEF s/p CRT-D, LBBB, T2DM, HTN, HLD, CKD stage IIIa, hypothyroidism, OSA who presented to the ED for evaluation of chest pain, dyspnea, cough, increased swelling bilateral lower extremities. Patient remains on CRRT and per nephrology would not be able to tolerate iHD due to need for multiple pressors.   I discussed with heart failure team. I discussed with RN. Reviewed notes and labs. I met today with [name] along with her son, [name], and 2 grandsons at bedside. [name] is sitting up in bed and working on feeding herself dinner. She is in overall good spirits. I introduced myself from palliative care. [name] shares that yesterday was very overwhelming with the information and what they learned about the severity of her health issues. I acknowledged that this is a lot to take in. He shares that they are still looking over Advance Directive document. I encouraged that the discussion had while reviewing the document can be even more important than the actual document itself. I offered to help them review and discuss if desired. [name] reports that this may be good to discuss tomorrow. I provided [name] with my contact information and he will reach out to coordinate a time to meet and discuss further tomorrow. I explained that we may not have a notary to complete until Friday anyway. I encouraged [name] to enjoy time with her family today and that we can talk more tomorrow.   All questions/concerns addressed. Emotional support provided.   Exam: Alert and oriented. Ill-appearing. No distress. Sitting up in bed. Breathing regular, unlabored. Abd flat. CRRT running. Moves all extremities.   Plan: - Ongoing goals of care conversations and support - Time for outcomes  35 min  Bernarda Kennyth, NP Palliative Medicine Team Pager [phone]  (Please see [email or web address] for schedule) Team Phone [phone]

## 2024-06-27 NOTE — Inpatient Diabetes Management (Signed)
 Inpatient Diabetes Program Recommendations  AACE/ADA: New Consensus Statement on Inpatient Glycemic Control (2015)  Target Ranges:  Prepandial:   less than 140 mg/dL      Peak postprandial:   less than 180 mg/dL (1-2 hours)      Critically ill patients:  140 - 180 mg/dL    Latest Reference Range & Units 06/26/24 06:31 06/26/24 10:51 06/26/24 15:57 06/26/24 21:04 06/27/24 06:27  Glucose-Capillary 70 - 99 mg/dL 799 (H) 704 (H) 788 (H) 204 (H) 229 (H)   Home DM Meds: Lantus  20 units BID Glucotrol  5 BID Ozempic  1 mg  Jardiance  10 mg every day   Current Orders: Novolog  Resistant Correction Scale/ SSI (0-20 units) TID AC + HS Lantus  18 units Daily  Ensure Plus high protein (19 grams of carbs) tid between meals  -   Increase Lantus  22 units    --Will follow patient during hospitalization--  Clotilda Bull RN, MSN, BC-ADM Inpatient Diabetes Coordinator Team Pager [phone] (8a-5p)

## 2024-06-27 NOTE — Progress Notes (Signed)
 PHARMACY - ANTICOAGULATION CONSULT NOTE  Pharmacy Consult:  Heparin  Indication: atrial fibrillation  Allergies  Allergen Reactions   Other Other (See Comments)    FLAGYL , CIPRO , PROTONIX  taken at the same time caused patient to lose her breath and have trouble breathing, requiring hospital stay at [facility], 03/23/14-per patient.    Fentanyl  Nausea Only    Room was spinning prefers not to take it   Lisinopril  Cough    Patient Measurements: Height: 5' 5 (165.1 cm) Weight: 83.4 kg (183 lb 13.8 oz) IBW/kg (Calculated) : 57 HEPARIN  DW (KG): 75.3 Heparin  dosing weight = 79 kg  Vital Signs: Temp: 99.1 F (37.3 C) (10/08 1100) Temp Source: Core (Comment) (10/08 0800) BP: 92/59 (10/08 1100) Pulse Rate: 85 (10/08 1100)  Labs: Recent Labs    06/25/24 0500 06/25/24 1700 06/26/24 0444 06/26/24 1557 06/27/24 0453  HGB 9.5*  --  9.8*  --  10.3*  HCT 29.0*  --  29.5*  --  31.1*  PLT 105*  --  95*  --  84*  HEPARINUNFRC 0.50  --  0.42  --  0.32  CREATININE 1.36*   < > 1.27* 1.54* 1.48*   < > = values in this interval not displayed.    Estimated Creatinine Clearance: 35 mL/min (A) (by C-G formula based on SCr of 1.48 mg/dL (H)).   Medical History: Past Medical History:  Diagnosis Date   A-fib (HCC) 06/12/2024   Anemia    Arthritis    CAD (coronary artery disease)    a. LHC (03/2014) Lmain: nl, LAD: diff dz proximal 20%, 30-40% dz mid vessel prior 2nd diagonal, LCx: 40% in OM1, 20-30% in OM2, RCA: 99% subtotal occlusion @ crux, TIMI 1 flow, L-R collaterals to distal vessel   Cardiomyopathy (HCC) 03/28/2014   LV dysfunction out of proportion to CAD 03/28/14   CHF (congestive heart failure) (HCC)    [employer]reesia 10/03/2020   Chronic systolic heart failure (HCC)    a. ECHO (03/2014): EF 25-30%, akinesis enteroanteroseptal myocardium, grade III DD b. RHC (03/2014) RA 4, RV 42/4, PA 44/14 (26), PCWP 21, PA 62% Fick CO/CI 6.9 / 3.4   Diabetes mellitus    Diabetes mellitus without complication  (HCC)    Phreesia 10/03/2020   Duodenal ulcer    Age 33   Dyspnea    Erosive esophagitis    Gastritis    Hypertension    Hypothyroidism    Obese    Short-term memory loss    Thyroid  disease    TTP (thrombotic thrombocytopenic purpura) (HCC) 06/2005    Assessment: 60 YOF with cardiogenic shock, now requiring CRRT.  Pharmacy consulted to transition from Eliquis  to IV heparin , last dose on 9/30 at 1001.  Will use aPTT to guide heparin  dosing until heparin  level and aPTT correlates.  06/27/24 AM: Heparin  level 0.32, therapeutic on heparin  1050 units/hr. No issues with infusion running or signs of bleeding noted. CBC stable (Hgb 10.3, PLT 84).   Goal of Therapy:  Heparin  level 0.3-0.7 units/ml aPTT 66-102 seconds Monitor platelets by anticoagulation protocol: Yes   Plan:  Continue heparin  1050 units/hr Daily heparin  level, CBC, and s/sx of bleeding daily F/u ability to transition to Eliquis   Thank you for allowing pharmacy to participate in this patient's care,  Morna Breach, PharmD PGY2 Cardiology Pharmacy Resident 06/27/2024 11:29 AM  Please check AMION for all [facility] Pharmacy phone numbers After 10:00 PM, call Main Pharmacy [phone]

## 2024-06-27 NOTE — Plan of Care (Signed)
  Problem: Education: Goal: Ability to describe self-care measures that may prevent or decrease complications (Diabetes Survival Skills Education) will improve Outcome: Progressing   Problem: Fluid Volume: Goal: Ability to maintain a balanced intake and output will improve Outcome: Progressing   Problem: Metabolic: Goal: Ability to maintain appropriate glucose levels will improve Outcome: Progressing   Problem: Nutritional: Goal: Maintenance of adequate nutrition will improve Outcome: Progressing

## 2024-06-27 NOTE — Progress Notes (Signed)
 Occupational Therapy Treatment Patient Details Name: [name] MRN: [id number] DOB: 10/12/1950 Today's Date: 06/27/2024   History of present illness Pt is a 73 y/o F presenting to ED on 9/29 with chest discomfort and SOB. CT chest with R > L pleural effusions, Admitted for acute on chronic CHF and AKI, started on CRRT 9/30. S/p successful DCCV on 10/5. PMH includes CAD, CHF s/p defibrillator, A fib on eliquis , pacemaker, anemia, arthritis, HTN, hypothyroidism, DM, cardiomyopathy.   OT comments  Pt progressing toward goals, up in chair upon arrival and reports being in chair since this morning. Pt able to stand x1 min for pericare with RW and then able to stand and perform pivot transfer to return to bed with min A +2. Pt max A for standing pericare, min A for bed mobility. Pt with mild dizziness upon standing/transferring and BP slightly soft, RN present to assist throughout session. Pt presenting with impairments listed below, will follow acutely. Patient will benefit from intensive inpatient follow-up therapy, >3 hours/day to maximize safety/ind with ADL/functional mobility.       If plan is discharge home, recommend the following:  A little help with walking and/or transfers;A lot of help with bathing/dressing/bathroom;Assistance with cooking/housework;Assist for transportation;Help with stairs or ramp for entrance   Equipment Recommendations  BSC/3in1    Recommendations for Other Services PT consult;Rehab consult    Precautions / Restrictions Precautions Precautions: Fall Recall of Precautions/Restrictions: Impaired Precaution/Restrictions Comments: Swan, CRRT (shoulder ROM limited <90*), watch BP Restrictions Weight Bearing Restrictions Per Provider Order: No       Mobility Bed Mobility Overal bed mobility: Needs Assistance Bed Mobility: Sit to Supine       Sit to supine: Min assist   General bed mobility comments: min A for safety with lines    Transfers Overall  transfer level: Needs assistance Equipment used: Rolling walker (2 wheels) Transfers: Sit to/from Stand                   Balance Overall balance assessment: Needs assistance Sitting-balance support: Feet supported Sitting balance-Leahy Scale: Good     Standing balance support: Bilateral upper extremity supported Standing balance-Leahy Scale: Poor Standing balance comment: BIL UE supported                           ADL either performed or assessed with clinical judgement   ADL Overall ADL's : Needs assistance/impaired                         Toilet Transfer: Minimal assistance;+2 for physical assistance;Rolling walker (2 wheels)   Toileting- Clothing Manipulation and Hygiene: Maximal assistance;Sit to/from stand Toileting - Clothing Manipulation Details (indicate cue type and reason): standing pericare     Functional mobility during ADLs: Minimal assistance;+2 for physical assistance;Rolling walker (2 wheels)      Extremity/Trunk Assessment Upper Extremity Assessment Upper Extremity Assessment: Generalized weakness   Lower Extremity Assessment Lower Extremity Assessment: Generalized weakness        Vision   Vision Assessment?: No apparent visual deficits   Perception Perception Perception: Not tested   Praxis Praxis Praxis: Not tested   Communication Communication Communication: No apparent difficulties   Cognition Arousal: Alert Behavior During Therapy: Flat affect Cognition: No apparent impairments  Following commands: Intact        Cueing   Cueing Techniques: Verbal cues  Exercises      Shoulder Instructions       General Comments BP soft and pt with mild dizziness with standing/transferring    Pertinent Vitals/ Pain       Pain Assessment Pain Assessment: No/denies pain  Home Living                                          Prior  Functioning/Environment              Frequency  Min 2X/week        Progress Toward Goals  OT Goals(current goals can now be found in the care plan section)  Progress towards OT goals: Progressing toward goals  Acute Rehab OT Goals Patient Stated Goal: did not state OT Goal Formulation: With patient Time For Goal Achievement: 07/09/24 Potential to Achieve Goals: Good ADL Goals Pt Will Perform Grooming: with supervision;standing Pt Will Perform Upper Body Dressing: with min assist;sitting;standing Pt Will Perform Lower Body Dressing: with min assist;sitting/lateral leans;sit to/from stand Pt Will Transfer to Toilet: with contact guard assist;ambulating;regular height toilet Additional ADL Goal #1: pt will tolerate OOB standing activity x10 min in order to improve activity tolerance for ADLs  Plan      Co-evaluation                 AM-PAC OT 6 Clicks Daily Activity     Outcome Measure   Help from another person eating meals?: A Little Help from another person taking care of personal grooming?: A Little Help from another person toileting, which includes using toliet, bedpan, or urinal?: A Lot Help from another person bathing (including washing, rinsing, drying)?: A Lot Help from another person to put on and taking off regular upper body clothing?: A Lot Help from another person to put on and taking off regular lower body clothing?: A Lot 6 Click Score: 14    End of Session Equipment Utilized During Treatment: Oxygen;Rolling walker (2 wheels)  OT Visit Diagnosis: Unsteadiness on feet (R26.81);Other abnormalities of gait and mobility (R26.89);Muscle weakness (generalized) (M62.81)   Activity Tolerance Patient tolerated treatment well   Patient Left in bed;with call bell/phone within reach;with bed alarm set;with nursing/sitter in room   Nurse Communication Mobility status        Time: 8692-8672 OT Time Calculation (min): 20 min  Charges: OT General  Charges $OT Visit: 1 Visit OT Treatments $Therapeutic Activity: 8-22 mins  Elecia Serafin K, OTD, OTR/L SecureChat Preferred Acute Rehab [phone]   Laneta K Koonce 06/27/2024, 1:39 PM

## 2024-06-27 NOTE — Progress Notes (Signed)
 Nephrology Follow-Up Consult note   Assessment/Recommendations: [name] is a/an 73 y.o. female with a past medical history significant for systolic & diastolic heart failure s/p BiV ICD, CAD, OSA on BiPAP, LBBB, HTN, new Afib who presents to with Chest pain, SOB, cough.     Anuric AKI (not improving): Likely secondary to new RV heart failure, persistent atrial fibrillation, Cardiorenal Syndrome  -failed high dose diuretic challenge and CRRT initiated mainly for volume  Continue CRRT: 9/30-now as remains on inotrope/pressor support; adjusting UF to goal CVP 7-8 so currently net even -would not be able to tolerate iHD at this point given hemodynamics - discussed with she and son today -Continue to monitor daily Cr, Dose meds for GFR<15 -Monitor Daily I/Os, Daily weight  -Maintain MAP>65 for optimal renal perfusion.  -Agree with holding RAASi, avoid further nephrotoxins including NSAIDS, Morphine .  Unless absolutely necessary, avoid CT with contrast and/or MRI with gadolinium.      Hyponatremia: Mild, stable.  hypervolemic  Hypophosphatemia: Due to CRRT.  On replacement protocol.   AoC BiV HF; New onset RV failure: Pressors inotropes per primary; CRRT as above.  Rev'd cardiology notes - do not think additional improvement will be seen , recommending comfort care but cont full scope for now to see if any improvement.   Atrial fibrillation: On amiodarone drip.  S/p cardioversion 10/5.  Remains in sinus rhythm.    CAD: On atorvastatin   Cont GOC discussion led by palliative and heart failure.  As above she is not currently a dialysis candidate as her hemodynamics would not tolerate.  Unless her heart improves she will not be a dialysis candidate, if it does she would be - discussed w patient and son today.   [name] 06/27/2024 10:05 AM  ___________________________________________________________  CC: Chest pain  Interval History/Subjective:   Remains in ST. Remains anuric.  CRRT UF 4.3L net neg 15.4L for admit.   Remains on milrinine, NE, vaso with BPs in the 80-100/60s this AM.  Pt remains full code.   Medications:  Current Facility-Administered Medications  Medication Dose Route Frequency Provider Last Rate Last Admin   acetaminophen  (TYLENOL ) tablet 650 mg  650 mg Oral Q6H PRN Patel, Vishal R, MD   650 mg at 06/26/24 2157   Or   acetaminophen  (TYLENOL ) suppository 650 mg  650 mg Rectal Q6H PRN Patel, Vishal R, MD       albuterol  (PROVENTIL ) (2.5 MG/3ML) 0.083% nebulizer solution 2.5 mg  2.5 mg Nebulization Q6H PRN Patel, Vishal R, MD   2.5 mg at 06/18/24 0143   amiodarone (NEXTERONE PREMIX) 360-4.14 MG/200ML-% (1.8 mg/mL) IV infusion  60 mg/hr Intravenous Continuous Lee, [place], NP 33.3 mL/hr at 06/27/24 1000 60 mg/hr at 06/27/24 1000   atorvastatin  (LIPITOR ) tablet 80 mg  80 mg Oral Daily Patel, Vishal R, MD   80 mg at 06/27/24 9096   bisacodyl (DULCOLAX) EC tablet 5 mg  5 mg Oral Daily PRN Patel, Vishal R, MD       calcium  carbonate (TUMS - dosed in mg elemental calcium ) chewable tablet 200 mg of elemental calcium   200 mg of elemental calcium  Oral TID PRN Bensimhon, Daniel R, MD   200 mg of elemental calcium  at 06/27/24 0028   Chlorhexidine  Gluconate Cloth 2 % PADS 6 each  6 each Topical Daily Lee, [place], NP   6 each at 06/27/24 0904   feeding supplement (ENSURE PLUS HIGH PROTEIN) liquid 237 mL  237 mL Oral TID  BM Bensimhon, Toribio SAUNDERS, MD   237 mL at 06/27/24 0904   heparin  ADULT infusion 100 units/mL (25000 units/250mL)  1,050 Units/hr Intravenous Continuous Katsaros, Lindsey R, RPH 10.5 mL/hr at 06/27/24 1000 1,050 Units/hr at 06/27/24 1000   heparin  injection 1,000-6,000 Units  1,000-6,000 Units CRRT PRN Windle Evalene HERO, DO   2,800 Units at 06/19/24 1711   Influenza vac split trivalent PF (FLUZONE HIGH-DOSE) injection 0.5 mL  0.5 mL Intramuscular Prior to discharge Zenaida Morene PARAS, MD       insulin  aspart (novoLOG )  injection 0-20 Units  0-20 Units Subcutaneous TID WC Colletta Christina Garre, PA-C   7 Units at 06/27/24 9344   insulin  aspart (novoLOG ) injection 0-5 Units  0-5 Units Subcutaneous QHS Colletta Christina Garre, PA-C   2 Units at 06/26/24 2130   insulin  glargine (LANTUS ) injection 18 Units  18 Units Subcutaneous Daily Clegg, Amy D, NP   18 Units at 06/27/24 0904   levothyroxine  (SYNTHROID ) tablet 50 mcg  50 mcg Oral Q0600 Patel, Vishal R, MD   50 mcg at 06/27/24 9390   loperamide (IMODIUM) capsule 2 mg  2 mg Oral Q6H PRN Colletta Christina Garre, PA-C   2 mg at 06/22/24 1054   midodrine (PROAMATINE) tablet 15 mg  15 mg Oral Q8H Stoner, Benjamin J, MD   15 mg at 06/27/24 0658   milrinone (PRIMACOR) 20 MG/100 ML (0.2 mg/mL) infusion  0.125 mcg/kg/min Intravenous Continuous Zenaida Morene PARAS, MD 3.51 mL/hr at 06/27/24 1000 0.125 mcg/kg/min at 06/27/24 1000   morphine  (PF) 2 MG/ML injection 1 mg  1 mg Intravenous Once Stoner, Benjamin J, MD       multivitamin (RENA-VIT) tablet 1 tablet  1 tablet Oral QHS Bensimhon, Daniel R, MD   1 tablet at 06/26/24 2129   norepinephrine  (LEVOPHED ) 16 mg in 250mL (0.064 mg/mL) premix infusion  0-20 mcg/min Intravenous Titrated Zenaida Morene PARAS, MD 10.31 mL/hr at 06/27/24 1000 11 mcg/min at 06/27/24 1000   ondansetron  (ZOFRAN ) tablet 4 mg  4 mg Oral Q6H PRN Patel, Vishal R, MD       Or   ondansetron  (ZOFRAN ) injection 4 mg  4 mg Intravenous Q6H PRN Patel, Vishal R, MD   4 mg at 06/22/24 0602   Oral care mouth rinse  15 mL Mouth Rinse PRN Bensimhon, Toribio SAUNDERS, MD       prismasol BGK 4/2.5 infusion   CRRT Continuous Windle Evalene HERO, DO 400 mL/hr at 06/27/24 0028 New Bag at 06/27/24 0028   prismasol BGK 4/2.5 infusion   CRRT Continuous Windle Evalene HERO, DO 400 mL/hr at 06/27/24 0401 New Bag at 06/27/24 0401   prismasol BGK 4/2.5 infusion   CRRT Continuous Windle Evalene HERO, DO 1,500 mL/hr at 06/27/24 0757 New Bag at 06/27/24 0757   scopolamine  (TRANSDERM-SCOP) 1 MG/3DAYS 1 mg   1 patch Transdermal Q72H Sherryll Suzen SQUIBB, RPH   1 mg at 06/26/24 9045   senna-docusate (Senokot-S) tablet 1 tablet  1 tablet Oral QHS PRN Tobie Jorie SAUNDERS, MD       sodium chloride  0.9 % primer fluid for CRRT   CRRT PRN Darden, Timothy M, DO       sodium chloride  flush (NS) 0.9 % injection 10-40 mL  10-40 mL Intracatheter Q12H Bensimhon, Toribio SAUNDERS, MD   10 mL at 06/25/24 0905   sodium chloride  flush (NS) 0.9 % injection 10-40 mL  10-40 mL Intracatheter PRN Bensimhon, Toribio SAUNDERS, MD       sodium  chloride flush (NS) 0.9 % injection 3 mL  3 mL Intravenous Q12H Patel, Vishal R, MD   3 mL at 06/25/24 0905   sodium chloride  flush (NS) 0.9 % injection 3 mL  3 mL Intravenous Q12H Lee, [place], NP   3 mL at 06/25/24 0905   sodium chloride  flush (NS) 0.9 % injection 3 mL  3 mL Intravenous PRN Lee, Jordan, NP       trimethobenzamide PHYLLISTINE) injection 200 mg  200 mg Intramuscular Q6H PRN Lee, [place], NP       vasopressin (PITRESSIN) 20 Units in 100 mL (0.2 unit/mL) infusion-*FOR SHOCK*  0-0.03 Units/min Intravenous Continuous Zenaida Morene PARAS, MD 9 mL/hr at 06/27/24 1000 0.03 Units/min at 06/27/24 1000      Review of Systems: 10 systems reviewed and negative except per interval history/subjective  Physical Exam: Vitals:   06/27/24 0900 06/27/24 0915  BP: 103/60 (!) 89/60  Pulse: 88 88  Resp: 18 (!) 21  Temp: 99.3 F (37.4 C) 99.3 F (37.4 C)  SpO2: 99% 99%   Total I/O In: 323.2 [P.O.:120; I.V.:203.2] Out: 201.3   Intake/Output Summary (Last 24 hours) at 06/27/2024 1005 Last data filed at 06/27/2024 1000 Gross per 24 hour  Intake 3047.39 ml  Output 3800.2 ml  Net -752.81 ml   General: well-appearing, no acute distress, on 4LNC in chair eating lunch CV: ST on monitor, trace peripheral edema Lungs: Bilateral breath, normal work of breathing Abd: soft, non-tender, non-distended Skin: no visible lesions or rashes Psych: Alert Musculoskeletal: no obvious deformities LIJ c/d/i  Test  Results I personally reviewed new and old clinical labs and radiology tests Lab Results  Component Value Date   NA 130 (L) 06/27/2024   K 4.6 06/27/2024   CL 95 (L) 06/27/2024   CO2 23 06/27/2024   BUN 11 06/27/2024   CREATININE 1.48 (H) 06/27/2024   CALCIUM  8.4 (L) 06/27/2024   ALBUMIN 2.8 (L) 06/27/2024   PHOS 2.0 (L) 06/27/2024    CBC Recent Labs  Lab 06/25/24 0500 06/26/24 0444 06/27/24 0453  WBC 6.6 6.7 10.0  HGB 9.5* 9.8* 10.3*  HCT 29.0* 29.5* 31.1*  MCV 84.5 84.3 83.6  PLT 105* 95* 84*

## 2024-06-27 NOTE — Progress Notes (Signed)
 Advanced Heart Failure Rounding Note  Cardiologist: None   Chief Complaint: CHF  Subjective:    RHC 9/30: RA 17, PA 63/35 (46), PCW 40, Fick 5.7/2.8, TD 3.7/1.8, PVR 1.6, PAPi 1.6 + HDC placed  NE down to 12 this morning, goal MAP of 65. Remains on low dose milrinone as well as vaso. Feeling fair, remains net negative with filling pressures improving. Long term prognosis still poor but reluctant to transition to measures.   PAP: (40-68)/(13-36) 48/15 CVP:  [4 mmHg-27 mmHg] 7 mmHg PCWP:  [13 mmHg-24 mmHg] 19 mmHg CO:  [2.6 L/min-4.1 L/min] 4 L/min CI:  [1.27 L/min/m2-2 L/min/m2] 1.93 L/min/m2   Objective:    Weight Range: 83.4 kg Body mass index is 30.6 kg/m.   Vital Signs:   Temp:  [98.6 F (37 C)-99.5 F (37.5 C)] 99.1 F (37.3 C) (10/08 1100) Pulse Rate:  [79-98] 85 (10/08 1100) Resp:  [13-28] 22 (10/08 1100) BP: (80-114)/(43-76) 92/59 (10/08 1100) SpO2:  [88 %-100 %] 100 % (10/08 1100) Weight:  [83.4 kg] 83.4 kg (10/08 0630) Last BM Date : 06/27/24  Weight change: Filed Weights   06/24/24 0701 06/26/24 0600 06/27/24 0630  Weight: 87.7 kg 84.8 kg 83.4 kg   Intake/Output:  Intake/Output Summary (Last 24 hours) at 06/27/2024 1133 Last data filed at 06/27/2024 1100 Gross per 24 hour  Intake 2958.4 ml  Output 3841.9 ml  Net -883.5 ml   CVP 11-12  Physical Exam    GENERAL: NAD, ill appearing PULM:  Normal work of breathing, CTAB CARDIAC:  JVP: mildly elevated         Irregular rate with rhythm. Systolic murmur, 1+ edema. ABDOMEN: Soft, non-tender, non-distended. NEUROLOGIC: Patient is oriented x3 with no focal or lateralizing neurologic deficits.     Labs    CBC Recent Labs    06/26/24 0444 06/27/24 0453  WBC 6.7 10.0  HGB 9.8* 10.3*  HCT 29.5* 31.1*  MCV 84.3 83.6  PLT 95* 84*   Basic Metabolic Panel Recent Labs    89/92/74 0444 06/26/24 1557 06/27/24 0453  NA 132* 131* 130*  K 4.1 4.4 4.6  CL 100 96* 95*  CO2 24 25 23   GLUCOSE  226* 237* 268*  BUN 8 8 11   CREATININE 1.27* 1.54* 1.48*  CALCIUM  8.1* 8.1* 8.4*  MG 2.3  --  2.4  PHOS 1.6* 2.7 2.0*   Liver Function Tests Recent Labs    06/26/24 1557 06/27/24 0453  ALBUMIN 2.8* 2.8*   BNP (last 3 results) Recent Labs    06/17/24 1813  BNP 1,998.5*   Medications:    Scheduled Medications:  atorvastatin   80 mg Oral Daily   Chlorhexidine  Gluconate Cloth  6 each Topical Daily   feeding supplement  237 mL Oral TID BM   insulin  aspart  0-20 Units Subcutaneous TID WC   insulin  aspart  0-5 Units Subcutaneous QHS   insulin  glargine  18 Units Subcutaneous Daily   levothyroxine   50 mcg Oral Q0600   midodrine  15 mg Oral Q8H    morphine  injection  1 mg Intravenous Once   multivitamin  1 tablet Oral QHS   scopolamine   1 patch Transdermal Q72H   sodium chloride  flush  10-40 mL Intracatheter Q12H   sodium chloride  flush  3 mL Intravenous Q12H   sodium chloride  flush  3 mL Intravenous Q12H    Infusions:  amiodarone 60 mg/hr (06/27/24 1100)   heparin  1,050 Units/hr (06/27/24 1100)   milrinone 0.125  mcg/kg/min (06/27/24 1100)   norepinephrine  (LEVOPHED ) Adult infusion 12 mcg/min (06/27/24 1100)   prismasol BGK 4/2.5 400 mL/hr at 06/27/24 0028   prismasol BGK 4/2.5 400 mL/hr at 06/27/24 0401   prismasol BGK 4/2.5 1,500 mL/hr at 06/27/24 1106   sodium PHOSPHATE IVPB (in mmol)     vasopressin 0.03 Units/min (06/27/24 1100)    PRN Medications: acetaminophen  **OR** acetaminophen , albuterol , bisacodyl, calcium  carbonate, heparin , Influenza vac split trivalent PF, loperamide, ondansetron  **OR** ondansetron  (ZOFRAN ) IV, mouth rinse, senna-docusate, sodium chloride , sodium chloride  flush, sodium chloride  flush, trimethobenzamide  Patient Profile   [name] is a 73 y.o.  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   Admit with acute on chronic  biventricular HF in setting of AF c/b acute renal failure.  Assessment/Plan   Acute on Chronic Biventricular Heart Failure  New onset RV Failure -> cardiogenic shock - Presented in cardiogenic shock, worsening renal failure, afib - echo 9/30: EF 20-25%, mod LVH, D-shaped septum, RV mod reduced, degenerative MV w/ severe MR - RHC 9/30: RA 17, PA 63/35 (46), PCW 40, Fick 5.7/2.8, TD 3.7/1.8, PVR 1.6, PAPi 1.6  -Now s/p TEE/DCCV, improved symptoms but persistent pressor requirement - Continue midodrine 15mg  TID -Continue CRRT for now -Not a long term iHD candidate -Palliative care following  Persistent Atrial Fibrillation - new onset 8/25 - continue heparin  gtt - 10/5 S/P TEE/DC-CV -->SR   Severe MR - not noted on last echo 5/23 - severe on echo 9/30 with degenerative valve - Not a clip candidate   Acute renal failure - cardio-renal  -  now on CRRT - continue volume removal, see above. Continue amio for rate control.  - No significant change in renal recovery   CAD - LHC in 2015 showed 99% RCA occlusion with collaterals - hs-trop 35>35, No ACS - continue atorva 80 mg daily  Acute Hypoxic Respiratory Failure On 6 liters [place]. Oxygen saturations stable.   CRITICAL CARE Performed by: Morene JINNY Brownie   Total critical care time: 35 minutes  Critical care time was exclusive of separately billable procedures and treating other patients.  Critical care was necessary to treat or prevent imminent or life-threatening deterioration.  Critical care was time spent personally by me on the following activities: development of treatment plan with patient and/or surrogate as well as nursing, discussions with consultants, evaluation of patient's response to treatment, examination of patient, obtaining history from patient or surrogate, ordering and performing treatments and interventions, ordering and review of laboratory studies, ordering and review of radiographic studies, pulse oximetry  and re-evaluation of patient's condition.   Length of Stay: 10   Morene JINNY Brownie, MD  06/27/2024, 11:33 AM  Advanced Heart Failure Team Pager [phone] (M-F; 7a - 5p)  Please contact [facility] for night-coverage after hours (5p -7a ) and weekends on [email or web address]

## 2024-06-28 DIAGNOSIS — I5023 Acute on chronic systolic (congestive) heart failure: Secondary | ICD-10-CM | POA: Diagnosis not present

## 2024-06-28 DIAGNOSIS — I4819 Other persistent atrial fibrillation: Secondary | ICD-10-CM | POA: Diagnosis not present

## 2024-06-28 LAB — RENAL FUNCTION PANEL
Albumin: 2.6 g/dL — ABNORMAL LOW (ref 3.5–5.0)
Albumin: 2.4 g/dL — ABNORMAL LOW (ref 3.5–5.0)
Anion gap: 10 (ref 5–15)
Anion gap: 9 (ref 5–15)
BUN: 12 mg/dL (ref 8–23)
BUN: 15 mg/dL (ref 8–23)
CO2: 24 mmol/L (ref 22–32)
CO2: 23 mmol/L (ref 22–32)
Calcium: 8.5 mg/dL — ABNORMAL LOW (ref 8.9–10.3)
Calcium: 8.5 mg/dL — ABNORMAL LOW (ref 8.9–10.3)
Chloride: 97 mmol/L — ABNORMAL LOW (ref 98–111)
Chloride: 100 mmol/L (ref 98–111)
Creatinine, Ser: 1.57 mg/dL — ABNORMAL HIGH (ref 0.44–1.00)
Creatinine, Ser: 1.95 mg/dL — ABNORMAL HIGH (ref 0.44–1.00)
GFR, Estimated: 34 mL/min — ABNORMAL LOW (ref >60)
GFR, Estimated: 26 mL/min — ABNORMAL LOW (ref >60)
Glucose, Bld: 238 mg/dL — ABNORMAL HIGH (ref 70–99)
Glucose, Bld: 136 mg/dL — ABNORMAL HIGH (ref 70–99)
Phosphorus: 2.3 mg/dL — ABNORMAL LOW (ref 2.5–4.6)
Phosphorus: 3.1 mg/dL (ref 2.5–4.6)
Potassium: 4.5 mmol/L (ref 3.5–5.1)
Potassium: 4.3 mmol/L (ref 3.5–5.1)
Sodium: 131 mmol/L — ABNORMAL LOW (ref 135–145)
Sodium: 132 mmol/L — ABNORMAL LOW (ref 135–145)

## 2024-06-28 LAB — CBC
HCT: 30.4 % — ABNORMAL LOW (ref 36.0–46.0)
Hemoglobin: 10 g/dL — ABNORMAL LOW (ref 12.0–15.0)
MCH: 27.5 pg (ref 26.0–34.0)
MCHC: 32.9 g/dL (ref 30.0–36.0)
MCV: 83.5 fL (ref 80.0–100.0)
Platelets: 90 K/uL — ABNORMAL LOW (ref 150–400)
RBC: 3.64 MIL/uL — ABNORMAL LOW (ref 3.87–5.11)
RDW: 20.8 % — ABNORMAL HIGH (ref 11.5–15.5)
WBC: 7.3 K/uL (ref 4.0–10.5)
nRBC: 0.5 % — ABNORMAL HIGH (ref 0.0–0.2)

## 2024-06-28 LAB — MAGNESIUM: Magnesium: 2.2 mg/dL (ref 1.7–2.4)

## 2024-06-28 LAB — HEPARIN LEVEL (UNFRACTIONATED): Heparin Unfractionated: 0.3 [IU]/mL (ref 0.30–0.70)

## 2024-06-28 LAB — COOXEMETRY PANEL
Carboxyhemoglobin: 2.8 % — ABNORMAL HIGH (ref 0.5–1.5)
Methemoglobin: <0.7 % (ref 0.0–1.5)
O2 Saturation: 68.2 %
Total hemoglobin: 10.5 g/dL — ABNORMAL LOW (ref 12.0–16.0)

## 2024-06-28 LAB — GLUCOSE, CAPILLARY
Glucose-Capillary: 231 mg/dL — ABNORMAL HIGH (ref 70–99)
Glucose-Capillary: 162 mg/dL — ABNORMAL HIGH (ref 70–99)
Glucose-Capillary: 117 mg/dL — ABNORMAL HIGH (ref 70–99)
Glucose-Capillary: 162 mg/dL — ABNORMAL HIGH (ref 70–99)
Glucose-Capillary: 171 mg/dL — ABNORMAL HIGH (ref 70–99)

## 2024-06-28 MED ORDER — MIDODRINE HCL 5 MG PO TABS
20.0000 mg | ORAL_TABLET | Freq: Three times a day (TID) | ORAL | Status: DC
  Administered 2024-06-28 – 2024-07-16 (×54): 20 mg via ORAL
  Filled 2024-06-28 (×54): qty 4

## 2024-06-28 MED ORDER — SODIUM PHOSPHATES 45 MMOLE/15ML IV SOLN
15.0000 mmol | Freq: Once | INTRAVENOUS | Status: AC
  Administered 2024-06-28: 15 mmol via INTRAVENOUS
  Filled 2024-06-28: qty 5

## 2024-06-28 MED ORDER — APIXABAN 5 MG PO TABS
5.0000 mg | ORAL_TABLET | Freq: Two times a day (BID) | ORAL | Status: DC
  Administered 2024-06-28 – 2024-07-16 (×37): 5 mg via ORAL
  Filled 2024-06-28 (×37): qty 1

## 2024-06-28 MED ORDER — AMIODARONE HCL 200 MG PO TABS
200.0000 mg | ORAL_TABLET | Freq: Every day | ORAL | Status: DC
  Administered 2024-06-28 – 2024-07-11 (×14): 200 mg via ORAL
  Filled 2024-06-28 (×14): qty 1

## 2024-06-28 MED ORDER — INSULIN GLARGINE 100 UNIT/ML ~~LOC~~ SOLN
28.0000 [IU] | Freq: Every day | SUBCUTANEOUS | Status: DC
  Administered 2024-06-28 – 2024-06-29 (×2): 28 [IU] via SUBCUTANEOUS
  Filled 2024-06-28 (×4): qty 0.28

## 2024-06-28 NOTE — Progress Notes (Signed)
 Advanced Heart Failure Rounding Note  Cardiologist: None   Chief Complaint: CHF  Subjective:    RHC 9/30: RA 17, PA 63/35 (46), PCW 40, Fick 5.7/2.8, TD 3.7/1.8, PVR 1.6, PAPi 1.6 + HDC placed  Currently on NE @4  and vaso @ 0.03.   Feels ok, sitting up in chair. Remains on CRRT.   Objective:    Weight Range: 84.2 kg Body mass index is 30.89 kg/m.   Vital Signs:   Temp:  [97.5 F (36.4 C)-99.3 F (37.4 C)] 97.5 F (36.4 C) (10/09 0600) Pulse Rate:  [80-110] 89 (10/09 0945) Resp:  [14-28] 25 (10/09 0945) BP: (79-117)/(53-69) 94/59 (10/09 0945) SpO2:  [78 %-100 %] 99 % (10/09 0945) Weight:  [84.2 kg] 84.2 kg (10/09 0645) Last BM Date : 06/27/24  Weight change: Filed Weights   06/26/24 0600 06/27/24 0630 06/28/24 0645  Weight: 84.8 kg 83.4 kg 84.2 kg   Intake/Output:  Intake/Output Summary (Last 24 hours) at 06/28/2024 1002 Last data filed at 06/28/2024 0900 Gross per 24 hour  Intake 2967.35 ml  Output 3181.4 ml  Net -214.05 ml  CVP UTA Physical Exam  General:  chronically ill appearing.  No respiratory difficulty Neck: JVD UTA.  Cor: Regular rate & rhythm. No murmurs. Lungs: diminished Extremities: +1 BLE edema  Neuro: alert & oriented x 3. Affect pleasant.   Tele: NSR 80s-90s. Brief a fib this morning.   Labs    CBC Recent Labs    06/27/24 0453 06/28/24 0534  WBC 10.0 7.3  HGB 10.3* 10.0*  HCT 31.1* 30.4*  MCV 83.6 83.5  PLT 84* 90*   Basic Metabolic Panel Recent Labs    89/91/74 0453 06/27/24 1555 06/28/24 0534  NA 130* 132* 131*  K 4.6 4.4 4.5  CL 95* 98 97*  CO2 23 24 24   GLUCOSE 268* 217* 238*  BUN 11 11 12   CREATININE 1.48* 1.49* 1.57*  CALCIUM  8.4* 8.2* 8.5*  MG 2.4  --  2.2  PHOS 2.0* 1.7* 2.3*   Liver Function Tests Recent Labs    06/27/24 1555 06/28/24 0534  ALBUMIN 2.6* 2.6*   BNP (last 3 results) Recent Labs    06/17/24 1813  BNP 1,998.5*   Medications:    Scheduled Medications:  amiodarone  200 mg Oral  Daily   apixaban   5 mg Oral BID   atorvastatin   80 mg Oral Daily   Chlorhexidine  Gluconate Cloth  6 each Topical Daily   feeding supplement  237 mL Oral TID BM   insulin  aspart  0-20 Units Subcutaneous TID WC   insulin  aspart  0-5 Units Subcutaneous QHS   insulin  aspart  4 Units Subcutaneous TID WC   insulin  glargine  28 Units Subcutaneous Daily   levothyroxine   50 mcg Oral Q0600   midodrine  20 mg Oral Q8H    morphine  injection  1 mg Intravenous Once   multivitamin  1 tablet Oral QHS   scopolamine   1 patch Transdermal Q72H   sodium chloride  flush  10-40 mL Intracatheter Q12H   sodium chloride  flush  3 mL Intravenous Q12H   sodium chloride  flush  3 mL Intravenous Q12H    Infusions:  sodium chloride  Stopped (06/28/24 0726)   norepinephrine  (LEVOPHED ) Adult infusion 4 mcg/min (06/28/24 0900)   prismasol BGK 4/2.5 400 mL/hr at 06/28/24 0401   prismasol BGK 4/2.5 400 mL/hr at 06/28/24 0301   prismasol BGK 4/2.5 1,500 mL/hr at 06/28/24 0815   sodium PHOSPHATE IVPB (in mmol)  43 mL/hr at 06/28/24 0900   vasopressin 0.03 Units/min (06/28/24 0900)    PRN Medications: sodium chloride , acetaminophen  **OR** acetaminophen , albuterol , bisacodyl, calcium  carbonate, heparin , Influenza vac split trivalent PF, loperamide, ondansetron  **OR** ondansetron  (ZOFRAN ) IV, mouth rinse, senna-docusate, sodium chloride , sodium chloride  flush, sodium chloride  flush, trimethobenzamide  Patient Profile   [name] is a 73 y.o.  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   Admit with acute on chronic biventricular HF in setting of AF c/b acute renal failure.  Assessment/Plan   Acute on Chronic Biventricular Heart Failure  New onset RV Failure -> cardiogenic shock - Presented in cardiogenic shock, worsening renal failure, afib - echo 9/30: EF 20-25%, mod LVH, D-shaped septum, RV mod reduced,  degenerative MV w/ severe MR - RHC 9/30: RA 17, PA 63/35 (46), PCW 40, Fick 5.7/2.8, TD 3.7/1.8, PVR 1.6, PAPi 1.6  - Now s/p TEE/DCCV, improved symptoms and improving pressor requirement - Increase midodrine 15>20 mg TID - Currently on milrinone, NE and vaso. Plan to stop milrinone today. Co-ox 68%.  - Continue CRRT for now. Not a long term iHD candidate - Palliative care following, ongoing discussions.   Persistent Atrial Fibrillation - new onset 8/25 - Stop hep gtt, start Eliquis  5 mg BID - Switch amiodarone gtt to PO - Brief a fib this morning, back in NSR.  - 10/5 S/P TEE/DC-CV -->SR   Severe MR - not noted on last echo 5/23 - severe on echo 9/30 with degenerative valve - Not a clip candidate   Acute renal failure - cardio-renal  - now on CRRT - continue volume removal per nephrology.  - No significant change in renal recovery.  - Per nephrology she would not tolerate iHD   CAD - LHC in 2015 showed 99% RCA occlusion with collaterals - hs-trop 35>35, No ACS - continue atorva 80 mg daily  Acute Hypoxic Respiratory Failure - On 6 liters [place]. Oxygen saturations stable.   CRITICAL CARE Performed by: Beckey LITTIE Coe   Total critical care time: 14 minutes  Critical care time was exclusive of separately billable procedures and treating other patients.  Critical care was necessary to treat or prevent imminent or life-threatening deterioration.  Critical care was time spent personally by me on the following activities: development of treatment plan with patient and/or surrogate as well as nursing, discussions with consultants, evaluation of patient's response to treatment, examination of patient, obtaining history from patient or surrogate, ordering and performing treatments and interventions, ordering and review of laboratory studies, ordering and review of radiographic studies, pulse oximetry and re-evaluation of patient's condition.   Length of Stay: [address] NP   06/28/2024, 10:02 AM  Advanced Heart Failure Team Pager [phone] (M-F; 7a - 5p)  Please contact [facility] for night-coverage after hours (5p -7a ) and weekends on [email or web address]

## 2024-06-28 NOTE — Plan of Care (Signed)
 Palliative:  I came to bedside. Visitors at bedside with [name]. Son is not present. I did not engage with [name] today and will allow her time with visitors. I will follow up again tomorrow.   No charge  Christina Kennyth, NP Palliative Medicine Team Pager [phone] (Please see [email or web address] for schedule) Team Phone [phone]

## 2024-06-28 NOTE — Progress Notes (Signed)
 Nephrology Follow-Up Consult note   Assessment/Recommendations: [name] is a/an 73 y.o. female with a past medical history significant for systolic & diastolic heart failure s/p BiV ICD, CAD, OSA on BiPAP, LBBB, HTN, new Afib who presents to with Chest pain, SOB, cough.     Anuric AKI (not improving): Likely secondary to new RV heart failure, persistent atrial fibrillation, Cardiorenal Syndrome  -failed high dose diuretic challenge and CRRT initiated mainly for volume  Continue CRRT: 9/30-now as remains on inotrope/pressor support; adjusting UF to goal CVP 7-8 so currently net even -would not be able to tolerate iHD at this point given hemodynamics - discussed with she and son at length 10/8 - no change today -Continue to monitor daily Cr, Dose meds for GFR<15 -Monitor Daily I/Os, Daily weight  -Maintain MAP>65 for optimal renal perfusion.  -Agree with holding RAASi, avoid further nephrotoxins including NSAIDS, Morphine .  Unless absolutely necessary, avoid CT with contrast and/or MRI with gadolinium.      Hyponatremia: Mild, stable.    Hypophosphatemia: Due to CRRT.  On replacement protocol.   AoC BiV HF; New onset RV failure: Pressors inotropes per primary; CRRT as above.  Rev'Best cardiology notes - do not think additional improvement will be seen , recommending comfort care but cont full scope for now to see if any improvement.   Atrial fibrillation: On amiodarone drip.  S/p cardioversion 10/5.  Remains in sinus rhythm.    CAD: On atorvastatin   Cont GOC discussion led by palliative and heart failure.  As above she is not currently a dialysis candidate as her hemodynamics would not tolerate.  Unless her heart improves she will not be a dialysis candidate, if it does she would be but the concern is this won't occur - discussed w patient and son bedside 10/8.   [name] [facility] 06/28/2024 7:55  AM  ___________________________________________________________  CC: Chest pain  Interval History/Subjective:  Remains anuric.  CRRT UF 3.2L net even yesterday,  neg 15.4L for admit.   Remains on milrinine, NE, vaso with BPs in the 90-100/60s this AM.  CVP 12 this AM per RN.  Pt remains full code.   Medications:  Current Facility-Administered Medications  Medication Dose Route Frequency Provider Last Rate Last Admin   0.9 %  sodium chloride  infusion   Intravenous PRN Stoner, Benjamin J, MD 10 mL/hr at 06/28/24 0700 Infusion Verify at 06/28/24 0700   acetaminophen  (TYLENOL ) tablet 650 mg  650 mg Oral Q6H PRN Patel, Vishal R, MD   650 mg at 06/26/24 2157   Or   acetaminophen  (TYLENOL ) suppository 650 mg  650 mg Rectal Q6H PRN Patel, Vishal R, MD       albuterol  (PROVENTIL ) (2.5 MG/3ML) 0.083% nebulizer solution 2.5 mg  2.5 mg Nebulization Q6H PRN Patel, Vishal R, MD   2.5 mg at 06/18/24 0143   amiodarone (PACERONE) tablet 200 mg  200 mg Oral Daily Stoner, Benjamin J, MD       atorvastatin  (LIPITOR ) tablet 80 mg  80 mg Oral Daily Patel, Vishal R, MD   80 mg at 06/27/24 9096   bisacodyl (DULCOLAX) EC tablet 5 mg  5 mg Oral Daily PRN Patel, Vishal R, MD       calcium  carbonate (TUMS - dosed in mg elemental calcium ) chewable tablet 200 mg of elemental calcium   200 mg of elemental calcium  Oral TID PRN Bensimhon, Daniel R, MD   200 mg of elemental calcium  at 06/27/24 0028   Chlorhexidine   Gluconate Cloth 2 % PADS 6 each  6 each Topical Daily Lee, [place], NP   6 each at 06/27/24 0904   feeding supplement (ENSURE PLUS HIGH PROTEIN) liquid 237 mL  237 mL Oral TID BM Bensimhon, Daniel R, MD   237 mL at 06/28/24 0200   heparin  ADULT infusion 100 units/mL (25000 units/250mL)  1,050 Units/hr Intravenous Continuous Katsaros, Lindsey R, RPH 10.5 mL/hr at 06/28/24 0700 1,050 Units/hr at 06/28/24 0700   heparin  injection 1,000-6,000 Units  1,000-6,000 Units CRRT PRN Windle Evalene HERO, DO   2,800 Units at  06/19/24 1711   Influenza vac split trivalent PF (FLUZONE HIGH-DOSE) injection 0.5 mL  0.5 mL Intramuscular Prior to discharge Zenaida Morene PARAS, MD       insulin  aspart (novoLOG ) injection 0-20 Units  0-20 Units Subcutaneous TID WC Christina Christina Garre, PA-C   7 Units at 06/28/24 9358   insulin  aspart (novoLOG ) injection 0-5 Units  0-5 Units Subcutaneous QHS Christina Christina Garre, PA-C   2 Units at 06/26/24 2130   insulin  aspart (novoLOG ) injection 4 Units  4 Units Subcutaneous TID WC Clegg, Christina D, NP   4 Units at 06/28/24 9357   insulin  glargine (LANTUS ) injection 28 Units  28 Units Subcutaneous Daily Zenaida Morene PARAS, MD       levothyroxine  (SYNTHROID ) tablet 50 mcg  50 mcg Oral Q0600 Patel, Vishal R, MD   50 mcg at 06/28/24 9387   loperamide (IMODIUM) capsule 2 mg  2 mg Oral Q6H PRN Christina Christina Garre, PA-C   2 mg at 06/22/24 1054   midodrine (PROAMATINE) tablet 20 mg  20 mg Oral Q8H Zenaida Morene PARAS, MD       morphine  (PF) 2 MG/ML injection 1 mg  1 mg Intravenous Once Stoner, Benjamin J, MD       multivitamin (RENA-VIT) tablet 1 tablet  1 tablet Oral QHS Bensimhon, Daniel R, MD   1 tablet at 06/27/24 2110   norepinephrine  (LEVOPHED ) 16 mg in (0.064 mg/mL) premix infusion  0-20 mcg/min Intravenous Titrated Zenaida Morene PARAS, MD 7.5 mL/hr at 06/28/24 0700 8 mcg/min at 06/28/24 0700   ondansetron  (ZOFRAN ) tablet 4 mg  4 mg Oral Q6H PRN Patel, Vishal R, MD       Or   ondansetron  (ZOFRAN ) injection 4 mg  4 mg Intravenous Q6H PRN Patel, Vishal R, MD   4 mg at 06/22/24 0602   Oral care mouth rinse  15 mL Mouth Rinse PRN Bensimhon, Christina SAUNDERS, MD       prismasol BGK 4/2.5 infusion   CRRT Continuous Darden, Timothy M, DO 400 mL/hr at 06/28/24 0401 New Bag at 06/28/24 0401   prismasol BGK 4/2.5 infusion   CRRT Continuous Windle Evalene HERO, DO 400 mL/hr at 06/28/24 0301 New Bag at 06/28/24 0301   prismasol BGK 4/2.5 infusion   CRRT Continuous Windle Evalene HERO, DO 1,500 mL/hr at 06/28/24  0645 New Bag at 06/28/24 0645   scopolamine  (TRANSDERM-SCOP) 1 MG/3DAYS 1 mg  1 patch Transdermal Q72H Sherryll Suzen SQUIBB, RPH   1 mg at 06/26/24 0954   senna-docusate (Senokot-S) tablet 1 tablet  1 tablet Oral QHS PRN Tobie Jorie SAUNDERS, MD       sodium chloride  0.9 % primer fluid for CRRT   CRRT PRN Darden, Timothy M, DO       sodium chloride  flush (NS) 0.9 % injection 10-40 mL  10-40 mL Intracatheter Q12H Bensimhon, Christina SAUNDERS, MD   10 mL at 06/25/24  9094   sodium chloride  flush (NS) 0.9 % injection 10-40 mL  10-40 mL Intracatheter PRN Bensimhon, Christina SAUNDERS, MD       sodium chloride  flush (NS) 0.9 % injection 3 mL  3 mL Intravenous Q12H Patel, Vishal R, MD   3 mL at 06/25/24 0905   sodium chloride  flush (NS) 0.9 % injection 3 mL  3 mL Intravenous Q12H Lee, [place], NP   3 mL at 06/25/24 0905   sodium chloride  flush (NS) 0.9 % injection 3 mL  3 mL Intravenous PRN Lee, [place], NP       sodium phosphate 15 mmol in sodium chloride  0.9 % 250 mL infusion  15 mmol Intravenous Once Serena Morna Best, RPH 43 mL/hr at 06/28/24 0726 15 mmol at 06/28/24 0726   trimethobenzamide (TIGAN) injection 200 mg  200 mg Intramuscular Q6H PRN Lee, Jordan, NP       vasopressin (PITRESSIN) 20 Units in 100 mL (0.2 unit/mL) infusion-*FOR SHOCK*  0-0.03 Units/min Intravenous Continuous Zenaida Morene PARAS, MD 9 mL/hr at 06/28/24 0700 0.03 Units/min at 06/28/24 0700      Review of Systems: 10 systems reviewed and negative except per interval history/subjective  Physical Exam: Vitals:   06/28/24 0730 06/28/24 0745  BP: 104/63 97/67  Pulse: (!) 104 97  Resp: (!) 21 (!) 21  Temp:    SpO2: (!) 85% 95%   No intake/output data recorded.  Intake/Output Summary (Last 24 hours) at 06/28/2024 0755 Last data filed at 06/28/2024 0700 Gross per 24 hour  Intake 2980.26 ml  Output 3195.4 ml  Net -215.14 ml   General: no acute distress, looks fatigued up in chair CV: SR on monitor, trace peripheral edema Lungs: Bilateral breath,  normal work of breathing Abd: soft, non-tender, non-distended Skin: no visible lesions or rashes Neuro: alert, grossly nonfocal LIJ c/Best/i  Test Results I personally reviewed new and old clinical labs and radiology tests Lab Results  Component Value Date   NA 131 (L) 06/28/2024   K 4.5 06/28/2024   CL 97 (L) 06/28/2024   CO2 24 06/28/2024   BUN 12 06/28/2024   CREATININE 1.57 (H) 06/28/2024   CALCIUM  8.5 (L) 06/28/2024   ALBUMIN 2.6 (L) 06/28/2024   PHOS 2.3 (L) 06/28/2024    CBC Recent Labs  Lab 06/26/24 0444 06/27/24 0453 06/28/24 0534  WBC 6.7 10.0 7.3  HGB 9.8* 10.3* 10.0*  HCT 29.5* 31.1* 30.4*  MCV 84.3 83.6 83.5  PLT 95* 84* 90*

## 2024-06-28 NOTE — Plan of Care (Signed)
  Problem: Fluid Volume: Goal: Ability to maintain a balanced intake and output will improve Outcome: Progressing   Problem: Metabolic: Goal: Ability to maintain appropriate glucose levels will improve Outcome: Progressing   Problem: Nutritional: Goal: Maintenance of adequate nutrition will improve Outcome: Progressing

## 2024-06-28 NOTE — Progress Notes (Signed)
 Physical Therapy Treatment Patient Details Name: [name] MRN: [id number] DOB: 01/13/1951 Today's Date: 06/28/2024   History of Present Illness Pt is a 73 y/o F presenting to ED on 9/29 with chest discomfort and SOB. CT chest with R > L pleural effusions, Admitted for acute on chronic CHF and AKI, started on CRRT 9/30. S/p successful DCCV on 10/5. PMH includes CAD, CHF s/p defibrillator, A fib on eliquis , pacemaker, anemia, arthritis, HTN, hypothyroidism, DM, cardiomyopathy.    PT Comments  Pt received sitting up in chair and agreeable to participate. Swan removed this AM and pt off CRRT temporarily. Pt able to perform seated warm up exercise and requiring min-mod assist for functional mobility (+2 safety/equipment). Pt ambulating 5 ft, then 10 ft with a RW and close chair follow, on 2-3L O2. VSS. Will continue to progress as tolerated.   If plan is discharge home, recommend the following: A little help with walking and/or transfers;A little help with bathing/dressing/bathroom;Assistance with cooking/housework;Assist for transportation   Can travel by private vehicle        Equipment Recommendations  Rolling walker (2 wheels);BSC/3in1;Wheelchair (measurements PT)    Recommendations for Other Services       Precautions / Restrictions Precautions Precautions: Fall Recall of Precautions/Restrictions: Impaired Precaution/Restrictions Comments: CRRT (shoulder ROM limited <90*), watch BP Restrictions Weight Bearing Restrictions Per Provider Order: No     Mobility  Bed Mobility Overal bed mobility: Needs Assistance Bed Mobility: Sit to Supine       Sit to supine: Min assist   General bed mobility comments: LE assist back into bed    Transfers Overall transfer level: Needs assistance Equipment used: Rolling walker (2 wheels) Transfers: Sit to/from Stand Sit to Stand: Min assist, +2 safety/equipment, Mod assist           General transfer comment: Rocking to gain  momentum, cues for hand placement, min-modA to power up to standing    Ambulation/Gait Ambulation/Gait assistance: Min assist, +2 safety/equipment Gait Distance (Feet): 15 Feet (5, 10') Assistive device: Rolling walker (2 wheels) Gait Pattern/deviations: Step-through pattern, Decreased stride length, Trunk flexed Gait velocity: decreased Gait velocity interpretation: <1.31 ft/sec, indicative of household ambulator   General Gait Details: Verbal cues for walker use, posture. MinA for balance, close chair follow utilized. Bilateral knee instability   Stairs             Wheelchair Mobility     Tilt Bed    Modified Rankin (Stroke Patients Only)       Balance Overall balance assessment: Needs assistance Sitting-balance support: Feet supported Sitting balance-Leahy Scale: Good     Standing balance support: Bilateral upper extremity supported Standing balance-Leahy Scale: Poor                              Communication Communication Communication: No apparent difficulties  Cognition Arousal: Alert Behavior During Therapy: Flat affect   PT - Cognitive impairments: No apparent impairments                         Following commands: Intact      Cueing Cueing Techniques: Verbal cues  Exercises General Exercises - Lower Extremity Long Arc Quad: AROM, Both, 10 reps, Seated Hip Flexion/Marching: AROM, Both, 10 reps, Seated    General Comments        Pertinent Vitals/Pain Pain Assessment Pain Assessment: No/denies pain    Home Living  Prior Function            PT Goals (current goals can now be found in the care plan section) Acute Rehab PT Goals Patient Stated Goal: Get well go home Potential to Achieve Goals: Fair Progress towards PT goals: Progressing toward goals    Frequency    Min 2X/week      PT Plan      Co-evaluation              AM-PAC PT 6 Clicks Mobility    Outcome Measure  Help needed turning from your back to your side while in a flat bed without using bedrails?: A Little Help needed moving from lying on your back to sitting on the side of a flat bed without using bedrails?: A Little Help needed moving to and from a bed to a chair (including a wheelchair)?: A Little Help needed standing up from a chair using your arms (e.g., wheelchair or bedside chair)?: A Little Help needed to walk in hospital room?: A Lot Help needed climbing 3-5 steps with a railing? : Total 6 Click Score: 15    End of Session Equipment Utilized During Treatment: Oxygen Activity Tolerance: Patient tolerated treatment well Patient left: in bed;with call bell/phone within reach Nurse Communication: Mobility status PT Visit Diagnosis: Unsteadiness on feet (R26.81);Other abnormalities of gait and mobility (R26.89);Muscle weakness (generalized) (M62.81);Difficulty in walking, not elsewhere classified (R26.2)     Time: 1420-1450 PT Time Calculation (min) (ACUTE ONLY): 30 min  Charges:    $Therapeutic Activity: 23-37 mins PT General Charges $$ ACUTE PT VISIT: 1 Visit                     Aleck Daring, PT, DPT Acute Rehabilitation Services Office [phone]    Aleck ONEIDA Daring 06/28/2024, 4:07 PM

## 2024-06-29 DIAGNOSIS — N179 Acute kidney failure, unspecified: Secondary | ICD-10-CM | POA: Diagnosis not present

## 2024-06-29 DIAGNOSIS — I4819 Other persistent atrial fibrillation: Secondary | ICD-10-CM | POA: Diagnosis not present

## 2024-06-29 DIAGNOSIS — Z7189 Other specified counseling: Secondary | ICD-10-CM | POA: Diagnosis not present

## 2024-06-29 DIAGNOSIS — I5023 Acute on chronic systolic (congestive) heart failure: Secondary | ICD-10-CM | POA: Diagnosis not present

## 2024-06-29 DIAGNOSIS — Z515 Encounter for palliative care: Secondary | ICD-10-CM | POA: Diagnosis not present

## 2024-06-29 LAB — CBC
HCT: 28.3 % — ABNORMAL LOW (ref 36.0–46.0)
Hemoglobin: 9.4 g/dL — ABNORMAL LOW (ref 12.0–15.0)
MCH: 27.6 pg (ref 26.0–34.0)
MCHC: 33.2 g/dL (ref 30.0–36.0)
MCV: 83.2 fL (ref 80.0–100.0)
Platelets: 103 K/uL — ABNORMAL LOW (ref 150–400)
RBC: 3.4 MIL/uL — ABNORMAL LOW (ref 3.87–5.11)
RDW: 20.3 % — ABNORMAL HIGH (ref 11.5–15.5)
WBC: 6.2 K/uL (ref 4.0–10.5)
nRBC: 0.5 % — ABNORMAL HIGH (ref 0.0–0.2)

## 2024-06-29 LAB — GLUCOSE, CAPILLARY
Glucose-Capillary: 132 mg/dL — ABNORMAL HIGH (ref 70–99)
Glucose-Capillary: 105 mg/dL — ABNORMAL HIGH (ref 70–99)
Glucose-Capillary: 107 mg/dL — ABNORMAL HIGH (ref 70–99)
Glucose-Capillary: 138 mg/dL — ABNORMAL HIGH (ref 70–99)

## 2024-06-29 LAB — COOXEMETRY PANEL
Carboxyhemoglobin: 1.5 % (ref 0.5–1.5)
Methemoglobin: <0.7 % (ref 0.0–1.5)
O2 Saturation: 60.8 %
Total hemoglobin: 10.2 g/dL — ABNORMAL LOW (ref 12.0–16.0)

## 2024-06-29 LAB — RENAL FUNCTION PANEL
Albumin: 2.4 g/dL — ABNORMAL LOW (ref 3.5–5.0)
Anion gap: 12 (ref 5–15)
BUN: 18 mg/dL (ref 8–23)
CO2: 23 mmol/L (ref 22–32)
Calcium: 8.5 mg/dL — ABNORMAL LOW (ref 8.9–10.3)
Chloride: 98 mmol/L (ref 98–111)
Creatinine, Ser: 2.58 mg/dL — ABNORMAL HIGH (ref 0.44–1.00)
GFR, Estimated: 19 mL/min — ABNORMAL LOW (ref >60)
Glucose, Bld: 137 mg/dL — ABNORMAL HIGH (ref 70–99)
Phosphorus: 4.1 mg/dL (ref 2.5–4.6)
Potassium: 4.6 mmol/L (ref 3.5–5.1)
Sodium: 133 mmol/L — ABNORMAL LOW (ref 135–145)

## 2024-06-29 LAB — HEPATITIS B SURFACE ANTIGEN: Hepatitis B Surface Ag: NONREACTIVE

## 2024-06-29 LAB — MAGNESIUM: Magnesium: 2.2 mg/dL (ref 1.7–2.4)

## 2024-06-29 MED ORDER — GERHARDT'S BUTT CREAM
TOPICAL_CREAM | CUTANEOUS | Status: DC | PRN
  Administered 2024-07-11: 1 via TOPICAL
  Filled 2024-06-29 (×2): qty 60

## 2024-06-29 MED ORDER — CHLORHEXIDINE GLUCONATE CLOTH 2 % EX PADS
6.0000 | MEDICATED_PAD | Freq: Every day | CUTANEOUS | Status: DC
  Administered 2024-06-29 – 2024-07-16 (×18): 6 via TOPICAL

## 2024-06-29 MED ORDER — FUROSEMIDE 10 MG/ML IJ SOLN
160.0000 mg | Freq: Once | INTRAVENOUS | Status: AC
  Administered 2024-06-29: 160 mg via INTRAVENOUS
  Filled 2024-06-29: qty 16

## 2024-06-29 MED ORDER — VASOPRESSIN 20 UNITS/100 ML INFUSION FOR SHOCK
0.0300 [IU]/min | INTRAVENOUS | Status: DC
  Administered 2024-06-29 – 2024-06-30 (×2): 0.03 [IU]/min via INTRAVENOUS
  Filled 2024-06-29 (×2): qty 100

## 2024-06-29 NOTE — Progress Notes (Signed)
 Palliative:  HPI: 73 y.o. female admitted on 06/17/2024 with medical history significant for CAD, persistent atrial fibrillation on Eliquis , chronic HFmrEF s/p CRT-D, LBBB, T2DM, HTN, HLD, CKD stage IIIa, hypothyroidism, OSA who presented to the ED for evaluation of chest pain, dyspnea, cough, increased swelling bilateral lower extremities. Patient remains on CRRT and per nephrology would not be able to tolerate iHD due to need for multiple pressors. 10/9 CRRT stopped. Likely trial for iHD 10/11.   I was notified by RN that they are ready for HCPOA/Advance Directive to be notarized. I came to bedside but son and paperwork not present.   I returned later and met with Ms. Espinal and son, [name]. Ms. Khatib is sitting up in recliner. Reviewed paperwork and it reflects Ms. Edmonston wishes and reflects that she trusts [name] to make decisions on her behalf. We discussed her health and plans moving forward. We discussed plans likely for iHD tomorrow and that this will be important to see if her heart and BP can tolerate this form of dialysis. We discussed the relationship between the heart and kidneys and the strain kidneys take on the heart. Ms. Ollis confirms her wishes to continue pushing forward. She does share that she would not want to live in a nursing home and would not want to be away from her family. She wants more time.  I spoke further with [name]. We reviewed discussions regarding code status and he reports that he has continued to speak with his mother and she requests full code, full scope. She is still hopeful. [name] although shares that he has time to process and has made peace with what is meant to be. He asks about process of what would happen if his mother died in the hospital and I reviewed with him. I also discussed my concern that is she were to arrest that he may likely be faced with the decision to liberate her from life support. [name] shares that he will do what he needs to do if it  comes to that and is prepared. He shares that his mother really wants to return home and wants to try everything. We discussed that we will know more over the coming days with dialysis trial and what to expect. I shared that if dialysis is not successful and not a realistic option to continue from the hospital we can consider other options to get her home with a goal of comfort and to spend time with family there. [name] expresses understanding.   All questions/concerns addressed. Emotional support provided. Discussed with RN and heart failure team.   Exam: Alert, oriented. No distress. HR RRR. Breathing regular, unlabored with oxygen. Abd soft. BLE edema.   Plan: - Full code, full scope - Ongoing goals of care discussions   50 min  Bernarda Kennyth, NP Palliative Medicine Team Pager [phone] (Please see [email or web address] for schedule) Team Phone [phone]

## 2024-06-29 NOTE — Progress Notes (Signed)
 Advanced Heart Failure Rounding Note  Cardiologist: None   Chief Complaint: CHF Subjective:    RHC 9/30: RA 17, PA 63/35 (46), PCW 40, Fick 5.7/2.8, TD 3.7/1.8, PVR 1.6, PAPi 1.6 + HDC placed  Currently on vaso @ 0.03.   Resting in the chair. Denies CP/SOB. Holding CRRT at this time.   Objective:    Weight Range: 83.1 kg Body mass index is 30.49 kg/m.   Vital Signs:   Temp:  [97.6 F (36.4 C)-98.3 F (36.8 C)] 98 F (36.7 C) (10/10 0315) Pulse Rate:  [81-96] 92 (10/10 0700) Resp:  [12-30] 19 (10/10 0700) BP: (82-109)/(56-77) 92/60 (10/10 0700) SpO2:  [93 %-100 %] 97 % (10/10 0700) Weight:  [83.1 kg] 83.1 kg (10/10 0500) Last BM Date : 06/28/24  Weight change: Filed Weights   06/27/24 0630 06/28/24 0645 06/29/24 0500  Weight: 83.4 kg 84.2 kg 83.1 kg   Intake/Output:  Intake/Output Summary (Last 24 hours) at 06/29/2024 0802 Last data filed at 06/29/2024 0600 Gross per 24 hour  Intake 1193.62 ml  Output 657.6 ml  Net 536.02 ml  CVP 10 Physical Exam  General:  chronically ill appearing.  No respiratory difficulty Neck: JVD UTA.  Cor: Regular rate & rhythm. No murmurs. Lungs: diminished Extremities: +1 BLE edema  Neuro: alert & oriented x 3. Affect pleasant.   Tele: NSR 80s (Personally reviewed)    Labs    CBC Recent Labs    06/28/24 0534 06/29/24 0538  WBC 7.3 6.2  HGB 10.0* 9.4*  HCT 30.4* 28.3*  MCV 83.5 83.2  PLT 90* 103*   Basic Metabolic Panel Recent Labs    89/90/74 0534 06/28/24 1838 06/29/24 0538  NA 131* 132* 133*  K 4.5 4.3 4.6  CL 97* 100 98  CO2 24 23 23   GLUCOSE 238* 136* 137*  BUN 12 15 18   CREATININE 1.57* 1.95* 2.58*  CALCIUM  8.5* 8.5* 8.5*  MG 2.2  --  2.2  PHOS 2.3* 3.1 4.1   Liver Function Tests Recent Labs    06/28/24 1838 06/29/24 0538  ALBUMIN 2.4* 2.4*   BNP (last 3 results) Recent Labs    06/17/24 1813  BNP 1,998.5*   Medications:    Scheduled Medications:  amiodarone  200 mg Oral Daily    apixaban   5 mg Oral BID   atorvastatin   80 mg Oral Daily   Chlorhexidine  Gluconate Cloth  6 each Topical Daily   feeding supplement  237 mL Oral TID BM   insulin  aspart  0-20 Units Subcutaneous TID WC   insulin  aspart  0-5 Units Subcutaneous QHS   insulin  aspart  4 Units Subcutaneous TID WC   insulin  glargine  28 Units Subcutaneous Daily   levothyroxine   50 mcg Oral Q0600   midodrine  20 mg Oral Q8H    morphine  injection  1 mg Intravenous Once   multivitamin  1 tablet Oral QHS   scopolamine   1 patch Transdermal Q72H   sodium chloride  flush  10-40 mL Intracatheter Q12H   sodium chloride  flush  3 mL Intravenous Q12H   sodium chloride  flush  3 mL Intravenous Q12H    Infusions:  sodium chloride  Stopped (06/28/24 1440)   norepinephrine  (LEVOPHED ) Adult infusion Stopped (06/29/24 0404)   prismasol BGK 4/2.5 400 mL/hr at 06/28/24 0401   prismasol BGK 4/2.5 400 mL/hr at 06/28/24 0301   prismasol BGK 4/2.5 1,500 mL/hr at 06/28/24 1003   vasopressin 0.03 Units/min (06/29/24 0600)    PRN Medications:  sodium chloride , acetaminophen  **OR** acetaminophen , albuterol , bisacodyl, calcium  carbonate, heparin , Influenza vac split trivalent PF, loperamide, ondansetron  **OR** ondansetron  (ZOFRAN ) IV, mouth rinse, senna-docusate, sodium chloride , sodium chloride  flush, sodium chloride  flush, trimethobenzamide  Patient Profile   [name] is a 73 y.o.  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   Admit with acute on chronic biventricular HF in setting of AF c/b acute renal failure.  Assessment/Plan   Acute on Chronic Biventricular Heart Failure  New onset RV Failure -> cardiogenic shock - Presented in cardiogenic shock, worsening renal failure, afib - echo 9/30: EF 20-25%, mod LVH, D-shaped septum, RV mod reduced, degenerative MV w/ severe MR - RHC 9/30: RA 17, PA 63/35 (46), PCW 40,  Fick 5.7/2.8, TD 3.7/1.8, PVR 1.6, PAPi 1.6  - Now s/p TEE/DCCV, improved symptoms and improving pressor requirement - Continue midodrine 20 mg TID - Previously on milrinone, NE and vaso. Now off milrinone and NE. On vaso 0.03. Co-ox 61%. Plan to stop vaso later today.   - Now off CRRT. Plan for lasix  challenge, follow UOP. Plan for iHD tomorrow to see how she tolerates.  - Palliative care following, ongoing discussions.   Persistent Atrial Fibrillation - new onset 8/25 - Continue Eliquis  5 mg BID - Continue PO amiodarone - NSR this morning.  - 10/5 S/P TEE/DC-CV -->SR   Severe MR - not noted on last echo 5/23 - severe on echo 9/30 with degenerative valve - Not a clip candidate   Acute renal failure - cardio-renal  - Holding CRRT - lasix  trial today. Plan for iHD tomorrow.  - No significant change in renal recovery.  - Add bladder scans.  - Nephrology following   CAD - LHC in 2015 showed 99% RCA occlusion with collaterals - hs-trop 35>35, No ACS - continue atorva 80 mg daily  Acute Hypoxic Respiratory Failure - On 2 liters [place]. Oxygen saturations stable.   CRITICAL CARE Performed by: Beckey LITTIE Coe   Total critical care time: 12 minutes  Critical care time was exclusive of separately billable procedures and treating other patients.  Critical care was necessary to treat or prevent imminent or life-threatening deterioration.  Critical care was time spent personally by me on the following activities: development of treatment plan with patient and/or surrogate as well as nursing, discussions with consultants, evaluation of patient's response to treatment, examination of patient, obtaining history from patient or surrogate, ordering and performing treatments and interventions, ordering and review of laboratory studies, ordering and review of radiographic studies, pulse oximetry and re-evaluation of patient's condition.   Length of Stay: 12   Beckey LITTIE Coe, NP  06/29/2024, 8:02  AM  Advanced Heart Failure Team Pager [phone] (M-F; 7a - 5p)  Please contact [facility] for night-coverage after hours (5p -7a ) and weekends on [email or web address]

## 2024-06-29 NOTE — Progress Notes (Signed)
 Nephrology Follow-Up Consult note   Assessment/Recommendations: [name] is a/an 73 y.o. female with a past medical history significant for systolic & diastolic heart failure s/p BiV ICD, CAD, OSA on BiPAP, LBBB, HTN, new Afib who presents to with Chest pain, SOB, cough.     Anuric AKI (not improving): Likely secondary to new RV heart failure, persistent atrial fibrillation, Cardiorenal Syndrome  -failed high dose diuretic challenge and CRRT initiated mainly for volume  Continue CRRT: 9/30-10/10 -holding CRRT for now given improved hemodynamics - should she remain as is tomorrow will attempt iHD; ok to use mid treatment midodrine for BP support -Continue to monitor daily Cr, Dose meds for GFR<15 -Monitor Daily I/Os, Daily weight  -Maintain MAP>65 for optimal renal perfusion.  -Agree with holding RAASi, avoid further nephrotoxins including NSAIDS, Morphine .  Unless absolutely necessary, avoid CT with contrast and/or MRI with gadolinium.      Hyponatremia: Mild, stable.    Hypophosphatemia: Due to CRRT.  On replacement protocol.   AoC BiV HF; New onset RV failure: pressors and inotropes weaned, on midodrine 20 TID now.  D/w cardiology - think additional cardiac improvement is unlikely.  As above will try iHD tomorrow if remains stable - if unable to tolerate it she likely will need hospice.    Atrial fibrillation: On amiodarone drip.  S/p cardioversion 10/5.  Remains in sinus rhythm.    CAD: On atorvastatin   Cont GOC discussion led by palliative and heart failure.  Trial of iHD tomorrow tentatively planned as above.  [name] 06/29/2024 8:52 AM  ___________________________________________________________  CC: Chest pain  Interval History/Subjective:  Remains anuric.  CRRT UF 0.8L net +0.5 yesterday,  neg 15.1L for admit - CRRT off overnight. Weaned off milrinone, NE, vaso and BP 80-90s/50-60s.  On midodrine 20 TID. CVP 12-13 this AM per RN.   Pt remains full code.  She is up in a chair - says feeling ok this AM.   Medications:  Current Facility-Administered Medications  Medication Dose Route Frequency Provider Last Rate Last Admin   0.9 %  sodium chloride  infusion   Intravenous PRN Stoner, Benjamin J, MD   Stopped at 06/28/24 1440   acetaminophen  (TYLENOL ) tablet 650 mg  650 mg Oral Q6H PRN Patel, Vishal R, MD   650 mg at 06/26/24 2157   Or   acetaminophen  (TYLENOL ) suppository 650 mg  650 mg Rectal Q6H PRN Patel, Vishal R, MD       albuterol  (PROVENTIL ) (2.5 MG/3ML) 0.083% nebulizer solution 2.5 mg  2.5 mg Nebulization Q6H PRN Patel, Vishal R, MD   2.5 mg at 06/18/24 0143   amiodarone (PACERONE) tablet 200 mg  200 mg Oral Daily Stoner, Benjamin J, MD   200 mg at 06/28/24 9045   apixaban  (ELIQUIS ) tablet 5 mg  5 mg Oral BID Stoner, Benjamin J, MD   5 mg at 06/28/24 2119   atorvastatin  (LIPITOR ) tablet 80 mg  80 mg Oral Daily Patel, Vishal R, MD   80 mg at 06/28/24 1001   bisacodyl (DULCOLAX) EC tablet 5 mg  5 mg Oral Daily PRN Patel, Vishal R, MD       calcium  carbonate (TUMS - dosed in mg elemental calcium ) chewable tablet 200 mg of elemental calcium   200 mg of elemental calcium  Oral TID PRN Bensimhon, Daniel R, MD   200 mg of elemental calcium  at 06/27/24 0028   Chlorhexidine  Gluconate Cloth 2 % PADS 6 each  6 each Topical Daily  Lee, [place], NP   6 each at 06/28/24 0915   feeding supplement (ENSURE PLUS HIGH PROTEIN) liquid 237 mL  237 mL Oral TID BM Bensimhon, Daniel R, MD   237 mL at 06/28/24 1954   heparin  injection 1,000-6,000 Units  1,000-6,000 Units CRRT PRN Darden, Timothy M, DO   3,000 Units at 06/28/24 1140   Influenza vac split trivalent PF (FLUZONE HIGH-DOSE) injection 0.5 mL  0.5 mL Intramuscular Prior to discharge Zenaida Morene PARAS, MD       insulin  aspart (novoLOG ) injection 0-20 Units  0-20 Units Subcutaneous TID WC Colletta Christina Garre, PA-C   3 Units at 06/29/24 9344   insulin  aspart (novoLOG ) injection 0-5 Units   0-5 Units Subcutaneous QHS Colletta Christina Garre, PA-C   2 Units at 06/26/24 2130   insulin  aspart (novoLOG ) injection 4 Units  4 Units Subcutaneous TID WC Clegg, Amy D, NP   4 Units at 06/29/24 0654   insulin  glargine (LANTUS ) injection 28 Units  28 Units Subcutaneous Daily Zenaida Morene PARAS, MD   28 Units at 06/28/24 0954   levothyroxine  (SYNTHROID ) tablet 50 mcg  50 mcg Oral Q0600 Patel, Vishal R, MD   50 mcg at 06/29/24 0620   loperamide (IMODIUM) capsule 2 mg  2 mg Oral Q6H PRN Colletta Christina Garre, PA-C   2 mg at 06/22/24 1054   midodrine (PROAMATINE) tablet 20 mg  20 mg Oral Q8H Stoner, Benjamin J, MD   20 mg at 06/29/24 9381   morphine  (PF) 2 MG/ML injection 1 mg  1 mg Intravenous Once Stoner, Benjamin J, MD       multivitamin (RENA-VIT) tablet 1 tablet  1 tablet Oral QHS Bensimhon, Daniel R, MD   1 tablet at 06/28/24 2119   ondansetron  (ZOFRAN ) tablet 4 mg  4 mg Oral Q6H PRN Patel, Vishal R, MD       Or   ondansetron  (ZOFRAN ) injection 4 mg  4 mg Intravenous Q6H PRN Patel, Vishal R, MD   4 mg at 06/22/24 0602   Oral care mouth rinse  15 mL Mouth Rinse PRN Bensimhon, Toribio SAUNDERS, MD       prismasol BGK 4/2.5 infusion   CRRT Continuous Darden, Timothy M, DO 400 mL/hr at 06/28/24 0401 New Bag at 06/28/24 0401   prismasol BGK 4/2.5 infusion   CRRT Continuous Darden, Timothy M, DO 400 mL/hr at 06/28/24 0301 New Bag at 06/28/24 0301   prismasol BGK 4/2.5 infusion   CRRT Continuous Windle Evalene HERO, DO 1,500 mL/hr at 06/28/24 1003 New Bag at 06/28/24 1003   scopolamine  (TRANSDERM-SCOP) 1 MG/3DAYS 1 mg  1 patch Transdermal Q72H Sherryll Suzen SQUIBB, RPH   1 mg at 06/26/24 0954   senna-docusate (Senokot-S) tablet 1 tablet  1 tablet Oral QHS PRN Tobie Jorie SAUNDERS, MD       sodium chloride  0.9 % primer fluid for CRRT   CRRT PRN Darden, Timothy M, DO       sodium chloride  flush (NS) 0.9 % injection 10-40 mL  10-40 mL Intracatheter Q12H Bensimhon, Toribio SAUNDERS, MD   10 mL at 06/25/24 0905   sodium chloride   flush (NS) 0.9 % injection 10-40 mL  10-40 mL Intracatheter PRN Bensimhon, Toribio SAUNDERS, MD       sodium chloride  flush (NS) 0.9 % injection 3 mL  3 mL Intravenous Q12H Patel, Vishal R, MD   3 mL at 06/25/24 0905   sodium chloride  flush (NS) 0.9 % injection 3 mL  3  mL Intravenous Q12H Lee, [place], NP   3 mL at 06/25/24 0905   sodium chloride  flush (NS) 0.9 % injection 3 mL  3 mL Intravenous PRN Lee, Jordan, NP       trimethobenzamide PHYLLISTINE) injection 200 mg  200 mg Intramuscular Q6H PRN Lee, Jordan, NP       vasopressin (PITRESSIN) 20 Units in 100 mL (0.2 unit/mL) infusion-*FOR SHOCK*  0-0.03 Units/min Intravenous Continuous Zenaida Morene PARAS, MD 9 mL/hr at 06/29/24 0600 0.03 Units/min at 06/29/24 0600      Review of Systems: 10 systems reviewed and negative except per interval history/subjective  Physical Exam: Vitals:   06/29/24 0645 06/29/24 0700  BP: (!) 93/58 92/60  Pulse: 86 92  Resp: (!) 21 19  Temp:    SpO2: 99% 97%   No intake/output data recorded.  Intake/Output Summary (Last 24 hours) at 06/29/2024 0852 Last data filed at 06/29/2024 0600 Gross per 24 hour  Intake 1073.62 ml  Output 657.6 ml  Net 416.02 ml   General: no acute distress, looks fatigued up in chair CV: SR on monitor, trace peripheral edema Lungs: Bilateral breath, normal work of breathing Abd: soft, non-tender, non-distended Skin: no visible lesions or rashes Neuro: alert, grossly nonfocal LIJ c/d/i  Test Results I personally reviewed new and old clinical labs and radiology tests Lab Results  Component Value Date   NA 133 (L) 06/29/2024   K 4.6 06/29/2024   CL 98 06/29/2024   CO2 23 06/29/2024   BUN 18 06/29/2024   CREATININE 2.58 (H) 06/29/2024   CALCIUM  8.5 (L) 06/29/2024   ALBUMIN 2.4 (L) 06/29/2024   PHOS 4.1 06/29/2024    CBC Recent Labs  Lab 06/27/24 0453 06/28/24 0534 06/29/24 0538  WBC 10.0 7.3 6.2  HGB 10.3* 10.0* 9.4*  HCT 31.1* 30.4* 28.3*  MCV 83.6 83.5 83.2  PLT 84* 90*  103*

## 2024-06-29 NOTE — Progress Notes (Signed)
 Physical Therapy Treatment Patient Details Name: [name] MRN: [id number] DOB: Jul 23, 1951 Today's Date: 06/29/2024   History of Present Illness Pt is a 73 y/o F presenting to ED on 9/29 with chest discomfort and SOB. CT chest with R > L pleural effusions, Admitted for acute on chronic CHF and AKI, started on CRRT 9/30. S/p successful DCCV on 10/5. PMH includes CAD, CHF s/p defibrillator, A fib on eliquis , pacemaker, anemia, arthritis, HTN, hypothyroidism, DM, cardiomyopathy.    PT Comments  Pt is progressing well towards goals. Pt BP increased from her sitting/supine pressure from the day in standing to 114/67 slightly down to 104/57 after gait. Pt reporting dizziness throughout that did not increase; did decrease in sitting. Pt was anxious stating her legs were going to buckle without any buckling noted during gait. Min A for sit to stand and gait with RW. Pt requires verbal/tactile cues for safety throughout session. Due to pt current functional status, home set up and available assistance at home recommending skilled physical therapy services > 3 hours/day in order to address strength, balance and functional mobility to decrease risk for falls, injury, immobility, skin break down and re-hospitalization.      If plan is discharge home, recommend the following: A little help with walking and/or transfers;A little help with bathing/dressing/bathroom;Assistance with cooking/housework;Assist for transportation     Equipment Recommendations  Rolling walker (2 wheels);BSC/3in1;Wheelchair (measurements PT)       Precautions / Restrictions Precautions Precautions: Fall Recall of Precautions/Restrictions: Impaired Precaution/Restrictions Comments: off CRRT 10/9, watch BP (stable 10/10) Restrictions Weight Bearing Restrictions Per Provider Order: No     Mobility  Bed Mobility   General bed mobility comments: up in recliner on arrival and departure.    Transfers Overall transfer level:  Needs assistance Equipment used: Rolling walker (2 wheels) Transfers: Sit to/from Stand Sit to Stand: Min assist           General transfer comment: Min A for 2x sit to stand from recliner with verbal cues for safe hand placement to push up from chair rather than pulling up from RW    Ambulation/Gait Ambulation/Gait assistance: Min assist Gait Distance (Feet): 30 Feet Assistive device: Rolling walker (2 wheels) Gait Pattern/deviations: Step-through pattern, Decreased stride length, Trunk flexed Gait velocity: decreased Gait velocity interpretation: <1.31 ft/sec, indicative of household ambulator   General Gait Details: Verbal cues for walker use including body proximity to AD and uright posture. MinA intermittently for balance and Ad navigation.     Balance Overall balance assessment: Needs assistance Sitting-balance support: Feet supported Sitting balance-Leahy Scale: Good     Standing balance support: Bilateral upper extremity supported Standing balance-Leahy Scale: Poor Standing balance comment: external assist intermittently      Communication Communication Communication: No apparent difficulties  Cognition Arousal: Alert Behavior During Therapy: WFL for tasks assessed/performed, Anxious   PT - Cognitive impairments: No apparent impairments   Following commands: Intact      Cueing Cueing Techniques: Verbal cues     General Comments General comments (skin integrity, edema, etc.): BP 114/67 in standing and 104/57 after gait. pt does report dizziness in standing      Pertinent Vitals/Pain Pain Assessment Pain Assessment: No/denies pain     PT Goals (current goals can now be found in the care plan section) Acute Rehab PT Goals Patient Stated Goal: Get well go home PT Goal Formulation: With patient Time For Goal Achievement: 07/09/24 Potential to Achieve Goals: Fair Progress towards PT goals: Progressing toward  goals    Frequency    Min  2X/week      PT Plan  Continue with current POC        AM-PAC PT 6 Clicks Mobility   Outcome Measure  Help needed turning from your back to your side while in a flat bed without using bedrails?: A Little Help needed moving from lying on your back to sitting on the side of a flat bed without using bedrails?: A Little   Help needed standing up from a chair using your arms (e.g., wheelchair or bedside chair)?: A Little Help needed to walk in hospital room?: A Little Help needed climbing 3-5 steps with a railing? : Total 6 Click Score: 13    End of Session Equipment Utilized During Treatment: Oxygen;Gait belt Activity Tolerance: Patient tolerated treatment well Patient left: with call bell/phone within reach;in chair Nurse Communication: Mobility status PT Visit Diagnosis: Unsteadiness on feet (R26.81);Other abnormalities of gait and mobility (R26.89);Muscle weakness (generalized) (M62.81);Difficulty in walking, not elsewhere classified (R26.2)     Time: 8755-8692 PT Time Calculation (min) (ACUTE ONLY): 23 min  Charges:    $Gait Training: 8-22 mins $Therapeutic Activity: 8-22 mins PT General Charges $$ ACUTE PT VISIT: 1 Visit                    Dorothyann Maier, DPT, CLT  Acute Rehabilitation Services Office: [phone] (Secure chat preferred)    Dorothyann VEAR Maier 06/29/2024, 1:12 PM

## 2024-06-30 DIAGNOSIS — I5023 Acute on chronic systolic (congestive) heart failure: Secondary | ICD-10-CM | POA: Diagnosis not present

## 2024-06-30 DIAGNOSIS — Z7189 Other specified counseling: Secondary | ICD-10-CM | POA: Diagnosis not present

## 2024-06-30 DIAGNOSIS — N179 Acute kidney failure, unspecified: Secondary | ICD-10-CM | POA: Diagnosis not present

## 2024-06-30 DIAGNOSIS — Z515 Encounter for palliative care: Secondary | ICD-10-CM | POA: Diagnosis not present

## 2024-06-30 LAB — RENAL FUNCTION PANEL
Albumin: 2.6 g/dL — ABNORMAL LOW (ref 3.5–5.0)
Anion gap: 13 (ref 5–15)
BUN: 25 mg/dL — ABNORMAL HIGH (ref 8–23)
CO2: 22 mmol/L (ref 22–32)
Calcium: 8.8 mg/dL — ABNORMAL LOW (ref 8.9–10.3)
Chloride: 97 mmol/L — ABNORMAL LOW (ref 98–111)
Creatinine, Ser: 3.56 mg/dL — ABNORMAL HIGH (ref 0.44–1.00)
GFR, Estimated: 13 mL/min — ABNORMAL LOW (ref >60)
Glucose, Bld: 80 mg/dL (ref 70–99)
Phosphorus: 4.5 mg/dL (ref 2.5–4.6)
Potassium: 4.6 mmol/L (ref 3.5–5.1)
Sodium: 132 mmol/L — ABNORMAL LOW (ref 135–145)

## 2024-06-30 LAB — HEPATITIS B SURFACE ANTIBODY, QUANTITATIVE: Hep B S AB Quant (Post): <3.5 m[IU]/mL — ABNORMAL LOW

## 2024-06-30 LAB — CBC
HCT: 31.8 % — ABNORMAL LOW (ref 36.0–46.0)
Hemoglobin: 10.6 g/dL — ABNORMAL LOW (ref 12.0–15.0)
MCH: 27.5 pg (ref 26.0–34.0)
MCHC: 33.3 g/dL (ref 30.0–36.0)
MCV: 82.4 fL (ref 80.0–100.0)
Platelets: 139 K/uL — ABNORMAL LOW (ref 150–400)
RBC: 3.86 MIL/uL — ABNORMAL LOW (ref 3.87–5.11)
RDW: 20.2 % — ABNORMAL HIGH (ref 11.5–15.5)
WBC: 7.2 K/uL (ref 4.0–10.5)
nRBC: 1 % — ABNORMAL HIGH (ref 0.0–0.2)

## 2024-06-30 LAB — MAGNESIUM: Magnesium: 2.1 mg/dL (ref 1.7–2.4)

## 2024-06-30 LAB — GLUCOSE, CAPILLARY
Glucose-Capillary: 52 mg/dL — ABNORMAL LOW (ref 70–99)
Glucose-Capillary: 85 mg/dL (ref 70–99)
Glucose-Capillary: 70 mg/dL (ref 70–99)
Glucose-Capillary: 52 mg/dL — ABNORMAL LOW (ref 70–99)
Glucose-Capillary: 269 mg/dL — ABNORMAL HIGH (ref 70–99)

## 2024-06-30 LAB — COOXEMETRY PANEL
Carboxyhemoglobin: 1.7 % — ABNORMAL HIGH (ref 0.5–1.5)
Methemoglobin: <0.7 % (ref 0.0–1.5)
O2 Saturation: 57 %
Total hemoglobin: 11 g/dL — ABNORMAL LOW (ref 12.0–16.0)

## 2024-06-30 MED ORDER — DEXTROSE 50 % IV SOLN: 25.0000 g | INTRAVENOUS | Status: AC

## 2024-06-30 MED ORDER — DEXTROSE 50 % IV SOLN
INTRAVENOUS | Status: AC
  Administered 2024-06-30: 50 mL
  Filled 2024-06-30: qty 50

## 2024-06-30 MED ORDER — HEPARIN SODIUM (PORCINE) 1000 UNIT/ML DIALYSIS: 1000.0000 [IU] | INTRAMUSCULAR | Status: DC | PRN

## 2024-06-30 MED ORDER — ANTICOAGULANT SODIUM CITRATE 4% (200MG/5ML) IV SOLN: 5.0000 mL | Status: DC | PRN

## 2024-06-30 MED ORDER — DEXTROSE 50 % IV SOLN
25.0000 g | INTRAVENOUS | Status: AC
  Administered 2024-06-30: 25 g via INTRAVENOUS
  Filled 2024-06-30: qty 50

## 2024-06-30 MED ORDER — ALTEPLASE 2 MG IJ SOLR: 2.0000 mg | Freq: Once | INTRAMUSCULAR | Status: DC | PRN

## 2024-06-30 MED ORDER — HEPARIN SODIUM (PORCINE) 1000 UNIT/ML IJ SOLN
INTRAMUSCULAR | Status: AC
  Filled 2024-06-30: qty 3

## 2024-06-30 MED ORDER — METOCLOPRAMIDE HCL 5 MG/ML IJ SOLN
5.0000 mg | Freq: Three times a day (TID) | INTRAMUSCULAR | Status: DC | PRN
  Administered 2024-06-30: 5 mg via INTRAVENOUS
  Filled 2024-06-30: qty 2

## 2024-06-30 MED ORDER — HEPARIN SODIUM (PORCINE) 1000 UNIT/ML IJ SOLN
2800.0000 [IU] | Freq: Once | INTRAMUSCULAR | Status: AC
  Administered 2024-06-30: 2800 [IU]

## 2024-06-30 NOTE — Progress Notes (Signed)
 Nephrology Follow-Up Consult note   Assessment/Recommendations: [name] is a/an 73 y.o. female with a past medical history significant for systolic & diastolic heart failure s/p BiV ICD, CAD, OSA on BiPAP, LBBB, HTN, new Afib who presents to with Chest pain, SOB, cough.     Anuric AKI (not improving): Likely secondary to new RV heart failure, persistent atrial fibrillation, Cardiorenal Syndrome  -failed high dose diuretic challenge and CRRT initiated mainly for volume  Continued CRRT: 9/30-10/10 -trial iHD today in setting of optimized hemodynamics; ok to use mid treatment midodrine for BP support but no plans for albumin or pressor support given not available outpt. UF goal 1L.  -Continue to monitor daily Cr, Dose meds for GFR<15 -Monitor Daily I/Os, Daily weight  -Maintain MAP>65 for optimal renal perfusion.  -Agree with holding RAASi, avoid further nephrotoxins including NSAIDS, Morphine .  Unless absolutely necessary, avoid CT with contrast and/or MRI with gadolinium.      Hyponatremia: Mild, stable.    Hypophosphatemia: Due to CRRT.  On replacement protocol.  AoC BiV HF; New onset RV failure: pressors and inotropes weaned, on midodrine 20 TID now.  D/w cardiology - think additional cardiac improvement is unlikely.  As above will try iHD today - if unable to tolerate it she likely will need hospice.    Atrial fibrillation: On amiodarone drip.  S/p cardioversion 10/5.  Remains in sinus rhythm.    CAD: On atorvastatin   Cont GOC discussion led by palliative and heart failure.  Trial of iHD today tentatively planned as above.  [name] 06/30/2024 11:46 AM  ___________________________________________________________  CC: Chest pain  Interval History/Subjective:  Remains anuric. No new issues.  Off pressors/inotropes this AM. BP 90-100s/60s on midodrine.     Medications:  Current Facility-Administered Medications  Medication Dose Route  Frequency Provider Last Rate Last Admin   0.9 %  sodium chloride  infusion   Intravenous PRN Stoner, Benjamin J, MD   Stopped at 06/28/24 1440   acetaminophen  (TYLENOL ) tablet 650 mg  650 mg Oral Q6H PRN Patel, Vishal R, MD   650 mg at 06/26/24 2157   Or   acetaminophen  (TYLENOL ) suppository 650 mg  650 mg Rectal Q6H PRN Patel, Vishal R, MD       albuterol  (PROVENTIL ) (2.5 MG/3ML) 0.083% nebulizer solution 2.5 mg  2.5 mg Nebulization Q6H PRN Patel, Vishal R, MD   2.5 mg at 06/18/24 0143   alteplase (CATHFLO ACTIVASE) injection 2 mg  2 mg Intracatheter Once PRN Barters Manuelita DELENA, MD       amiodarone (PACERONE) tablet 200 mg  200 mg Oral Daily Stoner, Benjamin J, MD   200 mg at 06/30/24 0934   anticoagulant sodium citrate solution 5 mL  5 mL Intracatheter PRN Barters Manuelita DELENA, MD       apixaban  (ELIQUIS ) tablet 5 mg  5 mg Oral BID Stoner, Benjamin J, MD   5 mg at 06/30/24 9065   atorvastatin  (LIPITOR ) tablet 80 mg  80 mg Oral Daily Patel, Vishal R, MD   80 mg at 06/30/24 0934   bisacodyl (DULCOLAX) EC tablet 5 mg  5 mg Oral Daily PRN Patel, Vishal R, MD       calcium  carbonate (TUMS - dosed in mg elemental calcium ) chewable tablet 200 mg of elemental calcium   200 mg of elemental calcium  Oral TID PRN Bensimhon, Daniel R, MD   200 mg of elemental calcium  at 06/27/24 0028   Chlorhexidine  Gluconate Cloth 2 % PADS  6 each  6 each Topical Daily Lee, [place], NP   6 each at 06/30/24 0919   Chlorhexidine  Gluconate Cloth 2 % PADS 6 each  6 each Topical Q0600 Norine Manuelita LABOR, MD   6 each at 06/30/24 0500   feeding supplement (ENSURE PLUS HIGH PROTEIN) liquid 237 mL  237 mL Oral TID BM Stoner, Benjamin J, MD   237 mL at 06/29/24 1114   Gerhardt's butt cream   Topical PRN Hayes Beckey CROME, NP   Given at 06/29/24 2143   heparin  injection 1,000 Units  1,000 Units Intracatheter PRN Norine Manuelita LABOR, MD       heparin  sodium (porcine) injection 2,800 Units  2,800 Units Intracatheter Once Norine Manuelita LABOR, MD        Influenza vac split trivalent PF (FLUZONE HIGH-DOSE) injection 0.5 mL  0.5 mL Intramuscular Prior to discharge Zenaida Morene PARAS, MD       insulin  aspart (novoLOG ) injection 0-20 Units  0-20 Units Subcutaneous TID WC Colletta Manuelita Garre, PA-C   3 Units at 06/29/24 9344   insulin  aspart (novoLOG ) injection 0-5 Units  0-5 Units Subcutaneous QHS Colletta Manuelita Garre, PA-C   2 Units at 06/26/24 2130   insulin  aspart (novoLOG ) injection 4 Units  4 Units Subcutaneous TID WC Clegg, Amy D, NP   4 Units at 06/29/24 0654   levothyroxine  (SYNTHROID ) tablet 50 mcg  50 mcg Oral Q0600 Patel, Vishal R, MD   50 mcg at 06/30/24 0933   loperamide (IMODIUM) capsule 2 mg  2 mg Oral Q6H PRN Colletta Manuelita Garre, PA-C   2 mg at 06/22/24 1054   metoCLOPramide  (REGLAN ) injection 5 mg  5 mg Intravenous Q8H PRN Stoner, Benjamin J, MD   5 mg at 06/30/24 0930   midodrine (PROAMATINE) tablet 20 mg  20 mg Oral Q8H Stoner, Benjamin J, MD   20 mg at 06/30/24 9065   morphine  (PF) 2 MG/ML injection 1 mg  1 mg Intravenous Once Stoner, Benjamin J, MD       multivitamin (RENA-VIT) tablet 1 tablet  1 tablet Oral QHS Bensimhon, Daniel R, MD   1 tablet at 06/29/24 2139   ondansetron  (ZOFRAN ) tablet 4 mg  4 mg Oral Q6H PRN Patel, Vishal R, MD       Or   ondansetron  (ZOFRAN ) injection 4 mg  4 mg Intravenous Q6H PRN Patel, Vishal R, MD   4 mg at 06/30/24 9178   Oral care mouth rinse  15 mL Mouth Rinse PRN Bensimhon, Toribio SAUNDERS, MD       scopolamine  (TRANSDERM-SCOP) 1 MG/3DAYS 1 mg  1 patch Transdermal Q72H Sherryll Suzen SQUIBB, RPH   1 mg at 06/29/24 1034   senna-docusate (Senokot-S) tablet 1 tablet  1 tablet Oral QHS PRN Tobie Jorie SAUNDERS, MD       sodium chloride  0.9 % primer fluid for CRRT   CRRT PRN Darden, Timothy M, DO       sodium chloride  flush (NS) 0.9 % injection 10-40 mL  10-40 mL Intracatheter Q12H Bensimhon, Toribio SAUNDERS, MD   10 mL at 06/30/24 0919   sodium chloride  flush (NS) 0.9 % injection 10-40 mL  10-40 mL Intracatheter PRN  Bensimhon, Toribio SAUNDERS, MD       sodium chloride  flush (NS) 0.9 % injection 3 mL  3 mL Intravenous Q12H Patel, Vishal R, MD   3 mL at 06/30/24 0919   sodium chloride  flush (NS) 0.9 % injection 3 mL  3 mL Intravenous  Q12H Lee, [place], NP   3 mL at 06/30/24 0919   sodium chloride  flush (NS) 0.9 % injection 3 mL  3 mL Intravenous PRN Lee, [place], NP       trimethobenzamide PHYLLISTINE) injection 200 mg  200 mg Intramuscular Q6H PRN Lee, [place], NP          Review of Systems: 10 systems reviewed and negative except per interval history/subjective  Physical Exam: Vitals:   06/30/24 1100 06/30/24 1130  BP: 103/65 100/63  Pulse: 95 94  Resp: (!) 21 19  Temp:    SpO2: 96% 96%   Total I/O In: 24.8 [I.V.:24.8] Out: -   Intake/Output Summary (Last 24 hours) at 06/30/2024 1146 Last data filed at 06/30/2024 1000 Gross per 24 hour  Intake 276.02 ml  Output --  Net 276.02 ml   General: no acute distress, looks fatigued in bed CV: SR on monitor, trace peripheral edema Lungs: Bilateral breath, normal work of breathing Abd: soft, non-tender, non-distended Skin: no visible lesions or rashes Neuro: alert, grossly nonfocal LIJ temp HD catheter c/d/i  Test Results I personally reviewed new and old clinical labs and radiology tests Lab Results  Component Value Date   NA 132 (L) 06/30/2024   K 4.6 06/30/2024   CL 97 (L) 06/30/2024   CO2 22 06/30/2024   BUN 25 (H) 06/30/2024   CREATININE 3.56 (H) 06/30/2024   CALCIUM  8.8 (L) 06/30/2024   ALBUMIN 2.6 (L) 06/30/2024   PHOS 4.5 06/30/2024    CBC Recent Labs  Lab 06/28/24 0534 06/29/24 0538 06/30/24 0243  WBC 7.3 6.2 7.2  HGB 10.0* 9.4* 10.6*  HCT 30.4* 28.3* 31.8*  MCV 83.5 83.2 82.4  PLT 90* 103* 139*

## 2024-06-30 NOTE — Progress Notes (Signed)
 Consent for Dialysis signed and put  in floor chart.  Camellia Brasil, LPN-[facility]

## 2024-06-30 NOTE — Progress Notes (Signed)
 Received patient in bed to unit.  Alert and oriented.  Informed consent signed and in chart.   TX duration:3.5  Patient tolerated well.  Transported back to the room  Alert, without acute distress.  Hand-off given to patient's nurse.   Access used: Left internal jugular HD cath Access issues: none  Total UF removed: 1L   06/30/24 1809  Vitals  Temp 98.4 F (36.9 C)  Temp Source Oral  BP (!) 87/59  MAP (mmHg) 70  Pulse Rate 88  ECG Heart Rate 88  Resp 20  Weight 86 kg  Type of Weight Post-Dialysis  Oxygen Therapy  SpO2 97 %  O2 Device Nasal Cannula  O2 Flow Rate (L/min) 0.5 L/min  During Treatment Monitoring  Duration of HD Treatment -hour(s) 3.5 hour(s)  HD Safety Checks Performed Yes  Intra-Hemodialysis Comments Tx completed  Post Treatment  Dialyzer Clearance Heavily streaked  Liters Processed 63  Fluid Removed (mL) 1000 mL  Tolerated HD Treatment Yes  Hemodialysis Catheter Left Internal jugular Triple lumen Temporary (Non-Tunneled)  Placement Date/Time: 06/19/24 1438   Placed prior to admission: No  Serial / Lot #: [id number]  Expiration Date: 05/20/25  Time Out: Correct patient;Correct site;Correct procedure  Maximum sterile barrier precautions: Hand hygiene;Cap;Large sterile shee...  Site Condition No complications  Blue Lumen Status Flushed;Antimicrobial dead end cap;Heparin  locked  Red Lumen Status Flushed;Heparin  locked;Antimicrobial dead end cap  Purple Lumen Status Saline locked  Catheter fill solution Heparin  1000 units/ml  Catheter fill volume (Arterial) 1.4 cc  Catheter fill volume (Venous) 1.4  Dressing Type Transparent  Dressing Status Antimicrobial disc/dressing in place;Clean, Dry, Intact  Drainage Description None  Dressing Change Due 07/05/24  Post treatment catheter status Capped and Clamped     Camellia Brasil LPN [facility]

## 2024-06-30 NOTE — Progress Notes (Signed)
 Palliative:  HPI: HPI: 73 y.o. female admitted on 06/17/2024 with medical history significant for CAD, persistent atrial fibrillation on Eliquis , chronic HFmrEF s/p CRT-D, LBBB, T2DM, HTN, HLD, CKD stage IIIa, hypothyroidism, OSA who presented to the ED for evaluation of chest pain, dyspnea, cough, increased swelling bilateral lower extremities. Patient remains on CRRT and per nephrology would not be able to tolerate iHD due to need for multiple pressors. 10/9 CRRT stopped. Likely trial for iHD 10/11.   Reviewed records, labs, vital signs. I met today with Ms. Warga. She is fatigued with emesis bag in hand. Complaints of nausea in early morning hours. Reports improvement in nausea this afternoon. Zofran  and Reglan  given. Last BM documented 10/9. Discussed that toxins from kidney failure and fluid build up from kidneys and heart failure can also cause nausea.   Ms. Hoppes is hopeful that she will be able to tolerate dialysis. Family are supportive of her wishes but also understand the complexities of her serious illness. Time for outcomes.   Son, [name], wishes to be called for any changes. He is a Education officer, environmental and has a commitment for tomorrow but plans to be at bedside after his commitment. I let him know I will be off service but will follow up on Tuesday when I return. I encouraged him to call phone number on my card [phone]) for palliative assistance/follow up in the meantime.   All questions/concerns addressed. Emotional support provided.   Exam: Fatigued. Alert, oriented. No distress. HR RRR. Breathing regular, unlabored with oxygen. Abd soft. BLE edema.   Plan: - Full code, full scope - Ongoing goals of care discussions  - Please call [phone] if palliative follow up needed prior to 10/14  30 min  Bernarda Kennyth, NP Palliative Medicine Team Pager [phone] (Please see [email or web address] for schedule) Team Phone [phone]

## 2024-06-30 NOTE — Progress Notes (Signed)
 Advanced Heart Failure Rounding Note  Cardiologist: None   Chief Complaint: CHF Subjective:    RHC 9/30: RA 17, PA 63/35 (46), PCW 40, Fick 5.7/2.8, TD 3.7/1.8, PVR 1.6, PAPi 1.6 + HDC placed  Feeling much worse this morning, Having some nausea and vomiting. Good bowel movement last night. Trial of iHD today.   Objective:    Weight Range: 84.4 kg Body mass index is 30.96 kg/m.   Vital Signs:   Temp:  [96.4 F (35.8 C)-97.9 F (36.6 C)] 97.9 F (36.6 C) (10/11 1058) Pulse Rate:  [79-95] 94 (10/11 1130) Resp:  [14-25] 19 (10/11 1130) BP: (83-120)/(56-103) 100/63 (10/11 1130) SpO2:  [83 %-99 %] 96 % (10/11 1130) Weight:  [84.4 kg] 84.4 kg (10/11 0500) Last BM Date : 06/29/24  Weight change: Filed Weights   06/28/24 0645 06/29/24 0500 06/30/24 0500  Weight: 84.2 kg 83.1 kg 84.4 kg   Intake/Output:  Intake/Output Summary (Last 24 hours) at 06/30/2024 1312 Last data filed at 06/30/2024 1000 Gross per 24 hour  Intake 173.79 ml  Output --  Net 173.79 ml  CVP 10 Physical Exam  GENERAL: chronically ill appearing PULM:  Normal work of breathing, CTAB CARDIAC:  JVP: flat         Normal rate with regular rhythm. Systolic murmur, trace edema. Warm and well perfused extremities. ABDOMEN: Soft, non-tender, non-distended. NEUROLOGIC: Patient is oriented x3 with no focal or lateralizing neurologic deficits.    Tele: NSR 80s (Personally reviewed)    Labs    CBC Recent Labs    06/29/24 0538 06/30/24 0243  WBC 6.2 7.2  HGB 9.4* 10.6*  HCT 28.3* 31.8*  MCV 83.2 82.4  PLT 103* 139*   Basic Metabolic Panel Recent Labs    89/89/74 0538 06/30/24 0243  NA 133* 132*  K 4.6 4.6  CL 98 97*  CO2 23 22  GLUCOSE 137* 80  BUN 18 25*  CREATININE 2.58* 3.56*  CALCIUM  8.5* 8.8*  MG 2.2 2.1  PHOS 4.1 4.5   Liver Function Tests Recent Labs    06/29/24 0538 06/30/24 0243  ALBUMIN 2.4* 2.6*   BNP (last 3 results) Recent Labs    06/17/24 1813  BNP 1,998.5*    Medications:    Scheduled Medications:  amiodarone  200 mg Oral Daily   apixaban   5 mg Oral BID   atorvastatin   80 mg Oral Daily   Chlorhexidine  Gluconate Cloth  6 each Topical Daily   Chlorhexidine  Gluconate Cloth  6 each Topical Q0600   feeding supplement  237 mL Oral TID BM   heparin  sodium (porcine)  2,800 Units Intracatheter Once   insulin  aspart  0-20 Units Subcutaneous TID WC   insulin  aspart  0-5 Units Subcutaneous QHS   insulin  aspart  4 Units Subcutaneous TID WC   levothyroxine   50 mcg Oral Q0600   midodrine  20 mg Oral Q8H    morphine  injection  1 mg Intravenous Once   multivitamin  1 tablet Oral QHS   scopolamine   1 patch Transdermal Q72H   sodium chloride  flush  10-40 mL Intracatheter Q12H   sodium chloride  flush  3 mL Intravenous Q12H   sodium chloride  flush  3 mL Intravenous Q12H    Infusions:  sodium chloride  Stopped (06/28/24 1440)   anticoagulant sodium citrate      PRN Medications: sodium chloride , acetaminophen  **OR** acetaminophen , albuterol , alteplase, anticoagulant sodium citrate, bisacodyl, calcium  carbonate, Gerhardt's butt cream, heparin , Influenza vac split trivalent PF, loperamide,  metoCLOPramide  (REGLAN ) injection, ondansetron  **OR** ondansetron  (ZOFRAN ) IV, mouth rinse, senna-docusate, sodium chloride , sodium chloride  flush, sodium chloride  flush, trimethobenzamide  Patient Profile   [name] is a 73 y.o.  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   Admit with acute on chronic biventricular HF in setting of AF c/b acute renal failure.  Assessment/Plan   Acute on Chronic Biventricular Heart Failure  New onset RV Failure -> cardiogenic shock - Presented in cardiogenic shock, worsening renal failure, afib - echo 9/30: EF 20-25%, mod LVH, D-shaped septum, RV mod reduced, degenerative MV w/ severe MR - RHC 9/30: RA 17, PA 63/35  (46), PCW 40, Fick 5.7/2.8, TD 3.7/1.8, PVR 1.6, PAPi 1.6  - Now s/p TEE/DCCV off vasopressors, inotropes. - Continue midodrine 20 mg TID - Trial of iHD today. No pressor or albumin support.    - Palliative care following, ongoing discussions.   Persistent Atrial Fibrillation - new onset 8/25 - Continue Eliquis  5 mg BID - Continue PO amiodarone - NSR - 10/5 S/P TEE/DC-CV -->SR   Severe MR - not noted on last echo 5/23 - severe on echo 9/30 with degenerative valve - Not a clip candidate   Acute renal failure - cardio-renal  - Holding CRRT - iHD - No significant change in renal recovery.  - Add bladder scans.  - Nephrology following   CAD - LHC in 2015 showed 99% RCA occlusion with collaterals - hs-trop 35>35, No ACS - continue atorva 80 mg daily  Acute Hypoxic Respiratory Failure - On 2 liters [place]. Oxygen saturations stable.   CRITICAL CARE Performed by: Morene JINNY Brownie   Total critical care time: 35 minutes  Critical care time was exclusive of separately billable procedures and treating other patients.  Critical care was necessary to treat or prevent imminent or life-threatening deterioration.  Critical care was time spent personally by me on the following activities: development of treatment plan with patient and/or surrogate as well as nursing, discussions with consultants, evaluation of patient's response to treatment, examination of patient, obtaining history from patient or surrogate, ordering and performing treatments and interventions, ordering and review of laboratory studies, ordering and review of radiographic studies, pulse oximetry and re-evaluation of patient's condition.   Length of Stay: 13   Morene JINNY Brownie, MD  06/30/2024, 1:12 PM  Advanced Heart Failure Team Pager [phone] (M-F; 7a - 5p)  Please contact [facility] for night-coverage after hours (5p -7a ) and weekends on [email or web address]

## 2024-07-01 DIAGNOSIS — I5023 Acute on chronic systolic (congestive) heart failure: Secondary | ICD-10-CM | POA: Diagnosis not present

## 2024-07-01 LAB — GLUCOSE, CAPILLARY
Glucose-Capillary: 90 mg/dL (ref 70–99)
Glucose-Capillary: 121 mg/dL — ABNORMAL HIGH (ref 70–99)
Glucose-Capillary: 158 mg/dL — ABNORMAL HIGH (ref 70–99)
Glucose-Capillary: 198 mg/dL — ABNORMAL HIGH (ref 70–99)

## 2024-07-01 LAB — BASIC METABOLIC PANEL WITH GFR
Anion gap: 11 (ref 5–15)
BUN: 16 mg/dL (ref 8–23)
CO2: 25 mmol/L (ref 22–32)
Calcium: 8.5 mg/dL — ABNORMAL LOW (ref 8.9–10.3)
Chloride: 96 mmol/L — ABNORMAL LOW (ref 98–111)
Creatinine, Ser: 2.9 mg/dL — ABNORMAL HIGH (ref 0.44–1.00)
GFR, Estimated: 16 mL/min — ABNORMAL LOW (ref >60)
Glucose, Bld: 88 mg/dL (ref 70–99)
Potassium: 4.3 mmol/L (ref 3.5–5.1)
Sodium: 132 mmol/L — ABNORMAL LOW (ref 135–145)

## 2024-07-01 LAB — CBC
HCT: 32.4 % — ABNORMAL LOW (ref 36.0–46.0)
Hemoglobin: 11.3 g/dL — ABNORMAL LOW (ref 12.0–15.0)
MCH: 27.6 pg (ref 26.0–34.0)
MCHC: 34.9 g/dL (ref 30.0–36.0)
MCV: 79 fL — ABNORMAL LOW (ref 80.0–100.0)
Platelets: 147 K/uL — ABNORMAL LOW (ref 150–400)
RBC: 4.1 MIL/uL (ref 3.87–5.11)
RDW: 19.4 % — ABNORMAL HIGH (ref 11.5–15.5)
WBC: 9 K/uL (ref 4.0–10.5)
nRBC: 0.9 % — ABNORMAL HIGH (ref 0.0–0.2)

## 2024-07-01 LAB — COOXEMETRY PANEL
Carboxyhemoglobin: 1.6 % — ABNORMAL HIGH (ref 0.5–1.5)
Methemoglobin: <0.7 % (ref 0.0–1.5)
O2 Saturation: 67 %
Total hemoglobin: 11.8 g/dL — ABNORMAL LOW (ref 12.0–16.0)

## 2024-07-01 LAB — MAGNESIUM: Magnesium: 1.9 mg/dL (ref 1.7–2.4)

## 2024-07-01 NOTE — Plan of Care (Signed)
  Problem: Nutritional: Goal: Maintenance of adequate nutrition will improve Outcome: Progressing   Problem: Clinical Measurements: Goal: Will remain free from infection Outcome: Progressing   Problem: Nutrition: Goal: Adequate nutrition will be maintained Outcome: Progressing   Problem: Safety: Goal: Ability to remain free from injury will improve Outcome: Progressing

## 2024-07-01 NOTE — Progress Notes (Signed)
 Advanced Heart Failure Rounding Note  Cardiologist: None   Chief Complaint: CHF Subjective:    RHC 9/30: RA 17, PA 63/35 (46), PCW 40, Fick 5.7/2.8, TD 3.7/1.8, PVR 1.6, PAPi 1.6 + HDC placed  Tolerated short session of iHD on 10/11.   Feeling better from a nausea standpoint. BP borderline, but asymptomatic. Keeping in the ICU given her low blood pressure. Will involve triad on Monday for continued care.   Objective:    Weight Range: 84.3 kg Body mass index is 30.93 kg/m.   Vital Signs:   Temp:  [97.6 F (36.4 C)-98.4 F (36.9 C)] 97.8 F (36.6 C) (10/12 0741) Pulse Rate:  [82-98] 91 (10/12 0700) Resp:  [16-27] 22 (10/12 0700) BP: (71-103)/(46-71) 83/62 (10/12 0700) SpO2:  [88 %-97 %] 95 % (10/12 0700) Weight:  [84.3 kg-87 kg] 84.3 kg (10/12 0445) Last BM Date : 06/29/24  Weight change: Filed Weights   06/30/24 1418 06/30/24 1809 07/01/24 0445  Weight: 87 kg 86 kg 84.3 kg   Intake/Output:  Intake/Output Summary (Last 24 hours) at 07/01/2024 1004 Last data filed at 06/30/2024 1844 Gross per 24 hour  Intake 450 ml  Output 1000 ml  Net -550 ml  CVP 10 Physical Exam  GENERAL: NAD, chronically ill appearing PULM:  Normal work of breathing, CTAB CARDIAC:  JVP: flat, cVP 6         Normal rate with regular rhythm. Systolic murmur. Warm and well perfused extremities. ABDOMEN: Soft, non-tender, non-distended. NEUROLOGIC: Patient is oriented x3 with no focal or lateralizing neurologic deficits.     Tele: NSR 80s (Personally reviewed)    Labs    CBC Recent Labs    06/30/24 0243 07/01/24 0435  WBC 7.2 9.0  HGB 10.6* 11.3*  HCT 31.8* 32.4*  MCV 82.4 79.0*  PLT 139* 147*   Basic Metabolic Panel Recent Labs    89/89/74 0538 06/30/24 0243 07/01/24 0435  NA 133* 132* 132*  K 4.6 4.6 4.3  CL 98 97* 96*  CO2 23 22 25   GLUCOSE 137* 80 88  BUN 18 25* 16  CREATININE 2.58* 3.56* 2.90*  CALCIUM  8.5* 8.8* 8.5*  MG 2.2 2.1 1.9  PHOS 4.1 4.5  --     Liver Function Tests Recent Labs    06/29/24 0538 06/30/24 0243  ALBUMIN 2.4* 2.6*   BNP (last 3 results) Recent Labs    06/17/24 1813  BNP 1,998.5*   Medications:    Scheduled Medications:  amiodarone  200 mg Oral Daily   apixaban   5 mg Oral BID   atorvastatin   80 mg Oral Daily   Chlorhexidine  Gluconate Cloth  6 each Topical Daily   Chlorhexidine  Gluconate Cloth  6 each Topical Q0600   feeding supplement  237 mL Oral TID BM   levothyroxine   50 mcg Oral Q0600   midodrine  20 mg Oral Q8H   multivitamin  1 tablet Oral QHS   scopolamine   1 patch Transdermal Q72H   sodium chloride  flush  10-40 mL Intracatheter Q12H   sodium chloride  flush  3 mL Intravenous Q12H   sodium chloride  flush  3 mL Intravenous Q12H    Infusions:  sodium chloride  Stopped (06/28/24 1440)   anticoagulant sodium citrate      PRN Medications: sodium chloride , acetaminophen  **OR** acetaminophen , albuterol , alteplase, anticoagulant sodium citrate, bisacodyl, calcium  carbonate, Gerhardt's butt cream, heparin , Influenza vac split trivalent PF, loperamide, metoCLOPramide  (REGLAN ) injection, ondansetron  **OR** ondansetron  (ZOFRAN ) IV, mouth rinse, senna-docusate, sodium chloride , sodium  chloride flush, sodium chloride  flush, trimethobenzamide  Patient Profile   [name] is a 73 y.o.  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   Admit with acute on chronic biventricular HF in setting of AF c/b acute renal failure.  Assessment/Plan   Acute on Chronic Biventricular Heart Failure  New onset RV Failure -> cardiogenic shock - Presented in cardiogenic shock, worsening renal failure, afib - echo 9/30: EF 20-25%, mod LVH, D-shaped septum, RV mod reduced, degenerative MV w/ severe MR - RHC 9/30: RA 17, PA 63/35 (46), PCW 40, Fick 5.7/2.8, TD 3.7/1.8, PVR 1.6, PAPi 1.6  - Now s/p TEE/DCCV off  vasopressors, inotropes. - Continue midodrine 20 mg TID - Did well with initial iHD session - Still concerned about her ability to function outside the hospital - Palliative care following, ongoing discussions  Persistent Atrial Fibrillation - new onset 8/25 - Continue Eliquis  5 mg BID - Continue PO amiodarone - NSR - 10/5 S/P TEE/DC-CV -->SR  Severe MR - not noted on last echo 5/23 - severe on echo 9/30 with degenerative valve - Not a clip candidate   Acute renal failure - cardio-renal  - Off CRRT - iHD - No significant change in renal recovery.  - Add bladder scans.  - Nephrology following   CAD - LHC in 2015 showed 99% RCA occlusion with collaterals - hs-trop 35>35, No ACS - continue atorva 80 mg daily  Acute Hypoxic Respiratory Failure - On 2 liters [place]. Oxygen saturations stable.   Length of Stay: 14   Morene JINNY Brownie, MD  07/01/2024, 10:04 AM  Advanced Heart Failure Team Pager [phone] (M-F; 7a - 5p)  Please contact [facility] for night-coverage after hours (5p -7a ) and weekends on [email or web address]

## 2024-07-01 NOTE — Progress Notes (Signed)
 Nephrology Follow-Up Consult note   Assessment/Recommendations: [name] is a/an 73 y.o. female with a past medical history significant for systolic & diastolic heart failure s/p BiV ICD, CAD, OSA on BiPAP, LBBB, HTN, new Afib who presents to with Chest pain, SOB, cough.     Anuric AKI (not improving): Likely secondary to new RV heart failure, persistent atrial fibrillation, Cardiorenal Syndrome  -failed high dose diuretic challenge and CRRT initiated mainly for volume  Continued CRRT: 9/30-10/10 -did trial iHD 10/11 in setting of optimized hemodynamics; ok to use mid treatment midodrine for BP support but no plans for albumin or pressor support given not available outpt. UF goal 1L.  She did tolerate -plan next HD Tues pending course -Continue to monitor daily Cr, Dose meds for GFR<15 -Monitor Daily I/Os, Daily weight  -Maintain MAP>65 for optimal renal perfusion.  -Agree with holding RAASi, avoid further nephrotoxins including NSAIDS, Morphine .  Unless absolutely necessary, avoid CT with contrast and/or MRI with gadolinium.      Hyponatremia: Mild, stable.    AoC BiV HF; New onset RV failure: pressors and inotropes weaned, on midodrine 20 TID now.  D/w cardiology - think additional cardiac improvement is unlikely.  Did tolerate HD yesterday with UF 1 which per I/Os would be appropriate to maintain euvolemia.  BP lower this AM and she was too fatigued yesterday to sit in chair.  At this point continue current care but in the coming days will need to see if she's overall strong enough to continue with dialysis and current therapies.     Atrial fibrillation: S/p cardioversion 10/5.  Remains in sinus rhythm.  On po amiodarone now.   CAD: On atorvastatin   Cont GOC discussion led by palliative and heart failure.  Plan continue dialysis as tolerated for now.    [name] 07/01/2024 10:21  AM  ___________________________________________________________  CC: Chest pain  Interval History/Subjective: Had HD late afternoon/ended ~6pm with 1L removed over 3.5h treatment yesterday - tolerated fine.  Says felt too fatigued to sit in chair at all yesterday, plans to try today.  BP on  monitor this AM 70/50s but she says I feel good.    Medications:  Current Facility-Administered Medications  Medication Dose Route Frequency Provider Last Rate Last Admin   0.9 %  sodium chloride  infusion   Intravenous PRN Stoner, Benjamin J, MD   Stopped at 06/28/24 1440   acetaminophen  (TYLENOL ) tablet 650 mg  650 mg Oral Q6H PRN Patel, Vishal R, MD   650 mg at 06/26/24 2157   Or   acetaminophen  (TYLENOL ) suppository 650 mg  650 mg Rectal Q6H PRN Patel, Vishal R, MD       albuterol  (PROVENTIL ) (2.5 MG/3ML) 0.083% nebulizer solution 2.5 mg  2.5 mg Nebulization Q6H PRN Patel, Vishal R, MD   2.5 mg at 06/18/24 0143   alteplase (CATHFLO ACTIVASE) injection 2 mg  2 mg Intracatheter Once PRN Barters Manuelita DELENA, MD       amiodarone (PACERONE) tablet 200 mg  200 mg Oral Daily Stoner, Benjamin J, MD   200 mg at 07/01/24 9060   anticoagulant sodium citrate solution 5 mL  5 mL Intracatheter PRN Barters Manuelita DELENA, MD       apixaban  (ELIQUIS ) tablet 5 mg  5 mg Oral BID Stoner, Benjamin J, MD   5 mg at 07/01/24 9060   atorvastatin  (LIPITOR ) tablet 80 mg  80 mg Oral Daily Patel, Vishal R, MD   80 mg  at 07/01/24 0939   bisacodyl (DULCOLAX) EC tablet 5 mg  5 mg Oral Daily PRN Patel, Vishal R, MD       calcium  carbonate (TUMS - dosed in mg elemental calcium ) chewable tablet 200 mg of elemental calcium   200 mg of elemental calcium  Oral TID PRN Bensimhon, Daniel R, MD   200 mg of elemental calcium  at 06/27/24 0028   Chlorhexidine  Gluconate Cloth 2 % PADS 6 each  6 each Topical Daily Lee, [place], NP   6 each at 07/01/24 0940   Chlorhexidine  Gluconate Cloth 2 % PADS 6 each  6 each Topical Q0600 Norine Manuelita LABOR, MD   6  each at 06/30/24 0500   feeding supplement (ENSURE PLUS HIGH PROTEIN) liquid 237 mL  237 mL Oral TID BM Zenaida Morene PARAS, MD   237 mL at 07/01/24 0940   Gerhardt's butt cream   Topical PRN Hayes Beckey CROME, NP   Given at 06/29/24 2143   heparin  injection 1,000 Units  1,000 Units Intracatheter PRN Norine Manuelita LABOR, MD       Influenza vac split trivalent PF (FLUZONE HIGH-DOSE) injection 0.5 mL  0.5 mL Intramuscular Prior to discharge Zenaida Morene PARAS, MD       levothyroxine  (SYNTHROID ) tablet 50 mcg  50 mcg Oral Q0600 Patel, Vishal R, MD   50 mcg at 07/01/24 0630   loperamide (IMODIUM) capsule 2 mg  2 mg Oral Q6H PRN Colletta Manuelita Garre, PA-C   2 mg at 06/22/24 1054   metoCLOPramide  (REGLAN ) injection 5 mg  5 mg Intravenous Q8H PRN Stoner, Benjamin J, MD   5 mg at 06/30/24 0930   midodrine (PROAMATINE) tablet 20 mg  20 mg Oral Q8H Stoner, Benjamin J, MD   20 mg at 07/01/24 0630   multivitamin (RENA-VIT) tablet 1 tablet  1 tablet Oral QHS Bensimhon, Toribio SAUNDERS, MD   1 tablet at 06/29/24 2139   ondansetron  (ZOFRAN ) tablet 4 mg  4 mg Oral Q6H PRN Patel, Vishal R, MD       Or   ondansetron  (ZOFRAN ) injection 4 mg  4 mg Intravenous Q6H PRN Patel, Vishal R, MD   4 mg at 06/30/24 9178   Oral care mouth rinse  15 mL Mouth Rinse PRN Bensimhon, Toribio SAUNDERS, MD       scopolamine  (TRANSDERM-SCOP) 1 MG/3DAYS 1 mg  1 patch Transdermal Q72H Sherryll Suzen SQUIBB, RPH   1 mg at 06/29/24 1034   senna-docusate (Senokot-S) tablet 1 tablet  1 tablet Oral QHS PRN Tobie Jorie SAUNDERS, MD       sodium chloride  0.9 % primer fluid for CRRT   CRRT PRN Darden, Timothy M, DO       sodium chloride  flush (NS) 0.9 % injection 10-40 mL  10-40 mL Intracatheter Q12H Bensimhon, Toribio SAUNDERS, MD   10 mL at 07/01/24 0940   sodium chloride  flush (NS) 0.9 % injection 10-40 mL  10-40 mL Intracatheter PRN Bensimhon, Toribio SAUNDERS, MD       sodium chloride  flush (NS) 0.9 % injection 3 mL  3 mL Intravenous Q12H Patel, Vishal R, MD   3 mL at 07/01/24 0940    sodium chloride  flush (NS) 0.9 % injection 3 mL  3 mL Intravenous Q12H Lee, [place], NP   3 mL at 07/01/24 0940   sodium chloride  flush (NS) 0.9 % injection 3 mL  3 mL Intravenous PRN Lee, Jordan, NP       trimethobenzamide PHYLLISTINE) injection 200 mg  200  mg Intramuscular Q6H PRN Lee, [place], NP          Review of Systems: 10 systems reviewed and negative except per interval history/subjective  Physical Exam: Vitals:   07/01/24 0700 07/01/24 0741  BP: (!) 83/62   Pulse: 91   Resp: (!) 22   Temp:  97.8 F (36.6 C)  SpO2: 95%    No intake/output data recorded.  Intake/Output Summary (Last 24 hours) at 07/01/2024 1021 Last data filed at 06/30/2024 1844 Gross per 24 hour  Intake 450 ml  Output 1000 ml  Net -550 ml   General: no acute distress, looks fatigued in bed CV: SR on monitor, trace peripheral edema Lungs: Bilateral breath, normal work of breathing Abd: soft, non-tender, non-distended Skin: no visible lesions or rashes Neuro: alert, grossly nonfocal LIJ temp HD catheter c/d/i  Test Results I personally reviewed new and old clinical labs and radiology tests Lab Results  Component Value Date   NA 132 (L) 07/01/2024   K 4.3 07/01/2024   CL 96 (L) 07/01/2024   CO2 25 07/01/2024   BUN 16 07/01/2024   CREATININE 2.90 (H) 07/01/2024   CALCIUM  8.5 (L) 07/01/2024   ALBUMIN 2.6 (L) 06/30/2024   PHOS 4.5 06/30/2024    CBC Recent Labs  Lab 06/29/24 0538 06/30/24 0243 07/01/24 0435  WBC 6.2 7.2 9.0  HGB 9.4* 10.6* 11.3*  HCT 28.3* 31.8* 32.4*  MCV 83.2 82.4 79.0*  PLT 103* 139* 147*

## 2024-07-01 NOTE — Plan of Care (Signed)
  Problem: Coping: Goal: Ability to adjust to condition or change in health will improve Outcome: Progressing   Problem: Activity: Goal: Risk for activity intolerance will decrease Outcome: Progressing   Problem: Nutrition: Goal: Adequate nutrition will be maintained Outcome: Progressing   Problem: Coping: Goal: Level of anxiety will decrease Outcome: Progressing

## 2024-07-02 ENCOUNTER — Ambulatory Visit (HOSPITAL_COMMUNITY)
Admission: RE | Admit: 2024-07-02 | Discharge: 2024-07-02 | Disposition: A | Discharge status: Home / Self Care | Source: Ambulatory Visit | Attending: Internal Medicine | Admitting: Internal Medicine

## 2024-07-02 ENCOUNTER — Encounter

## 2024-07-02 DIAGNOSIS — N186 End stage renal disease: Secondary | ICD-10-CM

## 2024-07-02 DIAGNOSIS — Z515 Encounter for palliative care: Secondary | ICD-10-CM | POA: Diagnosis not present

## 2024-07-02 DIAGNOSIS — I5023 Acute on chronic systolic (congestive) heart failure: Secondary | ICD-10-CM | POA: Diagnosis not present

## 2024-07-02 DIAGNOSIS — R531 Weakness: Secondary | ICD-10-CM | POA: Diagnosis not present

## 2024-07-02 DIAGNOSIS — Z992 Dependence on renal dialysis: Secondary | ICD-10-CM

## 2024-07-02 LAB — BASIC METABOLIC PANEL WITH GFR
Anion gap: 10 (ref 5–15)
BUN: 28 mg/dL — ABNORMAL HIGH (ref 8–23)
CO2: 24 mmol/L (ref 22–32)
Calcium: 8.6 mg/dL — ABNORMAL LOW (ref 8.9–10.3)
Chloride: 94 mmol/L — ABNORMAL LOW (ref 98–111)
Creatinine, Ser: 4.21 mg/dL — ABNORMAL HIGH (ref 0.44–1.00)
GFR, Estimated: 10 mL/min — ABNORMAL LOW (ref >60)
Glucose, Bld: 219 mg/dL — ABNORMAL HIGH (ref 70–99)
Potassium: 4.7 mmol/L (ref 3.5–5.1)
Sodium: 128 mmol/L — ABNORMAL LOW (ref 135–145)

## 2024-07-02 LAB — GLUCOSE, CAPILLARY
Glucose-Capillary: 217 mg/dL — ABNORMAL HIGH (ref 70–99)
Glucose-Capillary: 247 mg/dL — ABNORMAL HIGH (ref 70–99)
Glucose-Capillary: 256 mg/dL — ABNORMAL HIGH (ref 70–99)
Glucose-Capillary: 282 mg/dL — ABNORMAL HIGH (ref 70–99)
Glucose-Capillary: 249 mg/dL — ABNORMAL HIGH (ref 70–99)

## 2024-07-02 LAB — COOXEMETRY PANEL
Carboxyhemoglobin: 1.6 % — ABNORMAL HIGH (ref 0.5–1.5)
Methemoglobin: <0.7 % (ref 0.0–1.5)
O2 Saturation: 80.8 %
Total hemoglobin: 11.4 g/dL — ABNORMAL LOW (ref 12.0–16.0)

## 2024-07-02 LAB — CBC
HCT: 30.6 % — ABNORMAL LOW (ref 36.0–46.0)
Hemoglobin: 10.6 g/dL — ABNORMAL LOW (ref 12.0–15.0)
MCH: 27.2 pg (ref 26.0–34.0)
MCHC: 34.6 g/dL (ref 30.0–36.0)
MCV: 78.5 fL — ABNORMAL LOW (ref 80.0–100.0)
Platelets: 145 K/uL — ABNORMAL LOW (ref 150–400)
RBC: 3.9 MIL/uL (ref 3.87–5.11)
RDW: 18.9 % — ABNORMAL HIGH (ref 11.5–15.5)
WBC: 7.4 K/uL (ref 4.0–10.5)
nRBC: 0.9 % — ABNORMAL HIGH (ref 0.0–0.2)

## 2024-07-02 LAB — MAGNESIUM: Magnesium: 2 mg/dL (ref 1.7–2.4)

## 2024-07-02 MED ORDER — INSULIN ASPART 100 UNIT/ML IJ SOLN
0.0000 [IU] | Freq: Three times a day (TID) | INTRAMUSCULAR | Status: DC
  Administered 2024-07-03: 2 [IU] via SUBCUTANEOUS
  Administered 2024-07-03 – 2024-07-08 (×7): 1 [IU] via SUBCUTANEOUS
  Administered 2024-07-09: 3 [IU] via SUBCUTANEOUS
  Administered 2024-07-09: 2 [IU] via SUBCUTANEOUS
  Administered 2024-07-09: 1 [IU] via SUBCUTANEOUS
  Administered 2024-07-11: 2 [IU] via SUBCUTANEOUS
  Administered 2024-07-11: 1 [IU] via SUBCUTANEOUS
  Administered 2024-07-11: 6 [IU] via SUBCUTANEOUS

## 2024-07-02 MED ORDER — INSULIN ASPART 100 UNIT/ML IJ SOLN
0.0000 [IU] | Freq: Every day | INTRAMUSCULAR | Status: DC
  Administered 2024-07-02 – 2024-07-14 (×2): 2 [IU] via SUBCUTANEOUS

## 2024-07-02 NOTE — Progress Notes (Addendum)
 Advanced Heart Failure Rounding Note  Cardiologist: None   Chief Complaint: CHF Subjective:    RHC 9/30: RA 17, PA 63/35 (46), PCW 40, Fick 5.7/2.8, TD 3.7/1.8, PVR 1.6, PAPi 1.6 + HDC placed  Tolerated short session of iHD on 10/11.   Feels ok this morning. Denies CP/SOB. Wants to get up to the chair. CVP 6  Objective:    Weight Range: 85.5 kg Body mass index is 31.37 kg/m.   Vital Signs:   Temp:  [97.5 F (36.4 C)-98.6 F (37 C)] 97.8 F (36.6 C) (10/13 0700) Pulse Rate:  [84-94] 90 (10/13 0800) Resp:  [18-25] 22 (10/13 0800) BP: (76-99)/(55-71) 91/68 (10/13 0800) SpO2:  [91 %-97 %] 93 % (10/13 0800) Weight:  [85.5 kg] 85.5 kg (10/13 0500) Last BM Date : 06/30/24  Weight change: Filed Weights   06/30/24 1809 07/01/24 0445 07/02/24 0500  Weight: 86 kg 84.3 kg 85.5 kg   Intake/Output:  Intake/Output Summary (Last 24 hours) at 07/02/2024 0929 Last data filed at 07/02/2024 0800 Gross per 24 hour  Intake 180 ml  Output 166 ml  Net 14 ml  CVP 6 Physical Exam  General:  Chronically ill appearing.  No respiratory difficulty Neck: JVD ~6 cm.  Cor: Regular rate & rhythm. No murmurs. Lungs: clear, diminished bases Extremities: no edema  Neuro: alert & oriented x 3. Affect pleasant.   Tele: NSR 80s-90s (Personally reviewed)    Labs    CBC Recent Labs    07/01/24 0435 07/02/24 0458  WBC 9.0 7.4  HGB 11.3* 10.6*  HCT 32.4* 30.6*  MCV 79.0* 78.5*  PLT 147* 145*   Basic Metabolic Panel Recent Labs    89/88/74 0243 07/01/24 0435 07/02/24 0458  NA 132* 132* 128*  K 4.6 4.3 4.7  CL 97* 96* 94*  CO2 22 25 24   GLUCOSE 80 88 219*  BUN 25* 16 28*  CREATININE 3.56* 2.90* 4.21*  CALCIUM  8.8* 8.5* 8.6*  MG 2.1 1.9 2.0  PHOS 4.5  --   --    Liver Function Tests Recent Labs    06/30/24 0243  ALBUMIN 2.6*   BNP (last 3 results) Recent Labs    06/17/24 1813  BNP 1,998.5*   Medications:    Scheduled Medications:  amiodarone  200 mg Oral  Daily   apixaban   5 mg Oral BID   atorvastatin   80 mg Oral Daily   Chlorhexidine  Gluconate Cloth  6 each Topical Daily   Chlorhexidine  Gluconate Cloth  6 each Topical Q0600   feeding supplement  237 mL Oral TID BM   levothyroxine   50 mcg Oral Q0600   midodrine  20 mg Oral Q8H   multivitamin  1 tablet Oral QHS   scopolamine   1 patch Transdermal Q72H   sodium chloride  flush  10-40 mL Intracatheter Q12H   sodium chloride  flush  3 mL Intravenous Q12H   sodium chloride  flush  3 mL Intravenous Q12H    Infusions:  sodium chloride  Stopped (06/28/24 1440)   anticoagulant sodium citrate      PRN Medications: sodium chloride , acetaminophen  **OR** acetaminophen , albuterol , alteplase, anticoagulant sodium citrate, bisacodyl, calcium  carbonate, Gerhardt's butt cream, heparin , Influenza vac split trivalent PF, loperamide, metoCLOPramide  (REGLAN ) injection, ondansetron  **OR** ondansetron  (ZOFRAN ) IV, mouth rinse, senna-docusate, sodium chloride , sodium chloride  flush, sodium chloride  flush, trimethobenzamide  Patient Profile   [name] is a 73 y.o.  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   Admit with acute on chronic biventricular HF in setting of AF c/b acute renal failure.  Assessment/Plan   Acute on Chronic Biventricular Heart Failure  New onset RV Failure -> cardiogenic shock - Presented in cardiogenic shock, worsening renal failure, afib - echo 9/30: EF 20-25%, mod LVH, D-shaped septum, RV mod reduced, degenerative MV w/ severe MR - RHC 9/30: RA 17, PA 63/35 (46), PCW 40, Fick 5.7/2.8, TD 3.7/1.8, PVR 1.6, PAPi 1.6  - Now s/p TEE/DCCV off vasopressors, inotropes. - Continue midodrine 20 mg TID - Did well with initial iHD session - Still concerned about her ability to function outside the hospital - Palliative care following, ongoing discussions  Persistent Atrial  Fibrillation - new onset 8/25 - Continue Eliquis  5 mg BID - Continue PO amiodarone - NSR - 10/5 S/P TEE/DC-CV -->SR  Severe MR - not noted on last echo 5/23 - severe on echo 9/30 with degenerative valve - Not a clip candidate   Acute renal failure - cardio-renal  - Off CRRT - iHD per nephrology, next session tomorrow - No significant change in renal recovery.  - Continue bladder scans.    CAD - LHC in 2015 showed 99% RCA occlusion with collaterals - hs-trop 35>35, No ACS - continue atorva 80 mg daily  Acute Hypoxic Respiratory Failure - On 2 liters [place]. Oxygen saturations stable.   Will consult triad once out of the ICU, consider transfer out today, will discuss with MD. Stable from cardiac perspective at this time.   Length of Stay: [address], NP  07/02/2024, 9:29 AM  Advanced Heart Failure Team Pager [phone] (M-F; 7a - 5p)  Please contact [facility] for night-coverage after hours (5p -7a ) and weekends on [email or web address]  Patient seen with NP, I formulated the plan and agree with the above note.   She is in NSR this morning on amiodarone and Eliquis .   She had HD on Saturday, CVP 9-10 this morning on my read, co-ox 80%.  MAP stable on midodrine.  SBP 90s.    General: NAD, frail Neck: JVP 8-9 cm, no thyromegaly or thyroid  nodule.  Lungs: Clear to auscultation bilaterally with normal respiratory effort. CV: Nondisplaced PMI.  Heart regular S1/S2, no S3/S4, no murmur.  No peripheral edema.   Abdomen: Soft, nontender, no hepatosplenomegaly, no distention.  Skin: Intact without lesions or rashes.  Neurologic: Alert and oriented x 3.  Psych: Normal affect. Extremities: No clubbing or cyanosis.  HEENT: Normal.   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  GDMT limited by hypotension and ESRD.   - Continue midodrine to maintain MAP.  - She will have iHD TTS, next session tomorrow.  CVP 9-10 today.   She remains in NSR on  amiodarone and apixaban  s/p DCCV.   Weak, needs work with PT.  May need rehab stay at discharge.  Can go to telemetry.   Ezra Shuck 07/02/2024 10:21 AM

## 2024-07-02 NOTE — TOC Progression Note (Signed)
 Transition of Care [facility]) - Progression Note    Patient Details  Name: [name] MRN: [id number] Date of Birth: 11/05/1950  Transition of Care [facility]) CM/SW Contact  [name] Phone Number: [phone] 07/02/2024, 4:05 PM  Clinical Narrative:  HF CSW spoke with the patients son to update the [facility] : [facility] does not have bed availbility at this time. CSW discussed accepted SNF offers. Patients son stated that he would like some time to review SNFs. Patients son stated that his mother is leaning towards going home.    CSW sent the bed list offers and rating to patients son email: [email or web address]    Accepted bed offers below:                                Skilled Nursing Rehab Facilities-   [email or web address]  ?  ?  Ratings out of 5 stars (5 the highest)  ?   Name  Address   Phone #  Quality Care  Staffing  Health Inspection  Overall   [facility]**  [address], [place]  [phone]  2  1  1  1    [facility]**  [address], [place]  [phone]  5  3  2  3    [facility]  [address], [place]  [phone]  5  1  2  2    [facility]  [address], [place]  [phone]  1  3  3  2    [facility]**  [address] [place]  [phone]  4  3  2  2      HF CSW will follow up with family about SNF choice.   HF CSW/CM will continue to follow an monitor for dc readiness.     Expected Discharge Plan: Skilled Nursing Facility Barriers to Discharge: Continued Medical Work up               Expected Discharge Plan and Services   Discharge Planning Services: CM Consult Post Acute Care Choice: Skilled Nursing Facility Living arrangements for the past 2 months: Single Family Home                                       Social Drivers of Health (SDOH) Interventions SDOH Screenings   Food Insecurity: No Food Insecurity (06/18/2024)  Recent  Concern: Food Insecurity - Food Insecurity Present (04/23/2024)  Housing: Low Risk  (06/18/2024)  Transportation Needs: No Transportation Needs (06/18/2024)  Utilities: Not At Risk (06/18/2024)  Alcohol Screen: Low Risk  (03/21/2024)  Depression (PHQ2-9): Low Risk  (04/23/2024)  Financial Resource Strain: Low Risk  (03/21/2024)  Physical Activity: Sufficiently Active (03/21/2024)  Social Connections: Moderately Integrated (06/18/2024)  Stress: No Stress Concern Present (03/21/2024)  Tobacco Use: Medium Risk (06/17/2024)  Health Literacy: Adequate Health Literacy (03/21/2024)    Readmission Risk Interventions     No data to display

## 2024-07-02 NOTE — Plan of Care (Signed)
  Problem: Coping: Goal: Ability to adjust to condition or change in health will improve Outcome: Progressing   Problem: Skin Integrity: Goal: Risk for impaired skin integrity will decrease Outcome: Progressing   Problem: Activity: Goal: Ability to return to baseline activity level will improve Outcome: Progressing

## 2024-07-02 NOTE — Progress Notes (Signed)
 Patient ID: [name], female   DOB: 21-May-1951, 73 y.o.   MRN: [id number]    Progress Note from the Palliative Medicine Team at [facility]   Patient Name: [name]        Date: 07/02/2024 DOB: February 28, 1951  Age: 73 y.o. MRN#: [id number] Attending Physician: Zenaida Morene PARAS, MD Primary Care Physician: Tobie Suzzane POUR, MD Admit Date: 06/17/2024   Reason for Consultation/Follow-up   Establishing Goals of Care   HPI/ Brief Hospital Review   73 y.o. female   admitted on 06/17/2024 with  medical history significant for CAD, persistent atrial fibrillation on Eliquis , chronic HFmrEF s/p CRT-D, LBBB, T2DM, HTN, HLD, CKD stage IIIa, hypothyroidism, OSA who presented to the ED for evaluation of chest pain, dyspnea, cough, increased swelling bilateral lower extremities  Hospitalization complicated by worsening renal function,  now on HD and tolerating this time.  Multiple comorbidities, is high risk for decompensation  Patient and her family face ongoing decisions regarding treatment options, advanced directives and anticipatory care needs.     Subjective  Extensive chart review has been completed prior to meeting with patient  including labs, vital signs, imaging, progress/consult notes, orders, medications and available advance directive documents.    This NP assessed patient at the bedside as a follow up for palliative medicine needs and emotional support. Today is day 14 of this hospitalization.  Patient reports feeling better, she is tolerating hemodialysis.  She anticipates transfer out of ICU today  Ongoing education regarding complex current medical situation.  Patient remains hopeful and open to all offered and available life-prolonging measures.  She is hopeful for ongoing improvement.  Education offered on the importance of ongoing conversation with her family and the medical providers regarding overall plan of care and treatment options to ensure decisions are  within her values and goals of care.  It will be important for patient to consider increasing nursing care needs at home specific to function and mode ability, and logistics of outpatient dialysis  Patient will remain in conversation with her son [name]  PMT will continue to support holistically   Questions and concerns addressed   Discussed with nursing staff   Time: 35  minutes  Detailed review of medical records ( labs, imaging, vital signs), medically appropriate exam ( MS, skin, cardiac,  resp)   discussed with treatment team, counseling and education to patient, family, staff, documenting clinical information, medication management, coordination of care    Ronal Plants NP  Palliative Medicine Team Team Phone # [phone] Pager [phone]

## 2024-07-02 NOTE — Progress Notes (Signed)
 Patient ID: [name], female   DOB: 07-07-1951, 73 y.o.   MRN: [id number] S: no new complaints this morning. O:BP 91/68 (BP Location: Right Arm)   Pulse 90   Temp 97.8 F (36.6 C) (Axillary)   Resp (!) 22   Ht 5' 5 (1.651 m)   Wt 85.5 kg   SpO2 93%   BMI 31.37 kg/m   Intake/Output Summary (Last 24 hours) at 07/02/2024 0942 Last data filed at 07/02/2024 0800 Gross per 24 hour  Intake 180 ml  Output 166 ml  Net 14 ml   Intake/Output: I/O last 3 completed shifts: In: -  Out: 166 [Urine:166]  Intake/Output this shift:  Total I/O In: 180 [P.O.:180] Out: -  Weight change: -1.5 kg Gen: NAD CVS:  RRR Resp:CTA Abd: +BS, soft, NT/ND Ext: no edema  Recent Labs  Lab 06/26/24 1557 06/27/24 0453 06/27/24 1555 06/28/24 0534 06/28/24 1838 06/29/24 0538 06/30/24 0243 07/01/24 0435 07/02/24 0458  NA 131* 130* 132* 131* 132* 133* 132* 132* 128*  K 4.4 4.6 4.4 4.5 4.3 4.6 4.6 4.3 4.7  CL 96* 95* 98 97* 100 98 97* 96* 94*  CO2 25 23 24 24 23 23 22 25 24   GLUCOSE 237* 268* 217* 238* 136* 137* 80 88 219*  BUN 8 11 11 12 15 18  25* 16 28*  CREATININE 1.54* 1.48* 1.49* 1.57* 1.95* 2.58* 3.56* 2.90* 4.21*  ALBUMIN 2.8* 2.8* 2.6* 2.6* 2.4* 2.4* 2.6*  --   --   CALCIUM  8.1* 8.4* 8.2* 8.5* 8.5* 8.5* 8.8* 8.5* 8.6*  PHOS 2.7 2.0* 1.7* 2.3* 3.1 4.1 4.5  --   --    Liver Function Tests: Recent Labs  Lab 06/28/24 1838 06/29/24 0538 06/30/24 0243  ALBUMIN 2.4* 2.4* 2.6*   No results for input(s): LIPASE, AMYLASE in the last 168 hours. No results for input(s): AMMONIA in the last 168 hours. CBC: Recent Labs  Lab 06/28/24 0534 06/29/24 0538 06/30/24 0243 07/01/24 0435 07/02/24 0458  WBC 7.3 6.2 7.2 9.0 7.4  HGB 10.0* 9.4* 10.6* 11.3* 10.6*  HCT 30.4* 28.3* 31.8* 32.4* 30.6*  MCV 83.5 83.2 82.4 79.0* 78.5*  PLT 90* 103* 139* 147* 145*   Cardiac Enzymes: No results for input(s): CKTOTAL, CKMB, CKMBINDEX, TROPONINI in the last 168 hours. CBG: Recent Labs   Lab 07/01/24 0739 07/01/24 1121 07/01/24 1543 07/01/24 2153 07/02/24 0719  GLUCAP 90 121* 158* 198* 217*    Iron Studies: No results for input(s): IRON, TIBC, TRANSFERRIN, FERRITIN in the last 72 hours. Studies/Results: No results found.  amiodarone  200 mg Oral Daily   apixaban   5 mg Oral BID   atorvastatin   80 mg Oral Daily   Chlorhexidine  Gluconate Cloth  6 each Topical Daily   Chlorhexidine  Gluconate Cloth  6 each Topical Q0600   feeding supplement  237 mL Oral TID BM   levothyroxine   50 mcg Oral Q0600   midodrine  20 mg Oral Q8H   multivitamin  1 tablet Oral QHS   scopolamine   1 patch Transdermal Q72H   sodium chloride  flush  10-40 mL Intracatheter Q12H   sodium chloride  flush  3 mL Intravenous Q12H   sodium chloride  flush  3 mL Intravenous Q12H    BMET    Component Value Date/Time   NA 128 (L) 07/02/2024 0458   NA 138 06/12/2024 1638   K 4.7 07/02/2024 0458   CL 94 (L) 07/02/2024 0458   CO2 24 07/02/2024 0458   GLUCOSE 219 (H)  07/02/2024 0458   BUN 28 (H) 07/02/2024 0458   BUN 36 (H) 06/12/2024 1638   CREATININE 4.21 (H) 07/02/2024 0458   CREATININE 1.32 (H) 03/14/2020 1524   CALCIUM  8.6 (L) 07/02/2024 0458   GFRNONAA 10 (L) 07/02/2024 0458   GFRNONAA 40 (L) 03/14/2020 1524   GFRAA 47 (L) 03/14/2020 1524   CBC    Component Value Date/Time   WBC 7.4 07/02/2024 0458   RBC 3.90 07/02/2024 0458   HGB 10.6 (L) 07/02/2024 0458   HGB 12.0 06/12/2024 1638   HCT 30.6 (L) 07/02/2024 0458   HCT 37.1 06/12/2024 1638   PLT 145 (L) 07/02/2024 0458   PLT 214 06/12/2024 1638   MCV 78.5 (L) 07/02/2024 0458   MCV 89 06/12/2024 1638   MCH 27.2 07/02/2024 0458   MCHC 34.6 07/02/2024 0458   RDW 18.9 (H) 07/02/2024 0458   RDW 20.0 (H) 06/12/2024 1638   LYMPHSABS 1.0 06/13/2023 1356   MONOABS 0.6 04/30/2019 1403   EOSABS 0.3 06/13/2023 1356   BASOSABS 0.0 06/13/2023 1356    Assessment/Recommendations: [name] is a/an 73 y.o. female with a past  medical history significant for systolic & diastolic heart failure s/p BiV ICD, CAD, OSA on BiPAP, LBBB, HTN, new Afib who presents to with Chest pain, SOB, cough.      Anuric AKI (not improving): Likely secondary to new RV heart failure, persistent atrial fibrillation, Cardiorenal Syndrome  -failed high dose diuretic challenge and CRRT initiated mainly for volume  Continued CRRT: 9/30-10/10 -did trial iHD 10/11 in setting of optimized hemodynamics; ok to use mid treatment midodrine for BP support but no plans for albumin or pressor support given not available outpt. UF goal 1L.  She did tolerate -plan next HD 10/14 pending course -Continue to monitor daily Cr, Dose meds for GFR<15 -Monitor Daily I/Os, Daily weight  -Maintain MAP>65 for optimal renal perfusion.  -Agree with holding RAASi, avoid further nephrotoxins including NSAIDS, Morphine .  Unless absolutely necessary, avoid CT with contrast and/or MRI with gadolinium.       Hyponatremia: Mild, stable.     AoC BiV HF; New onset RV failure: pressors and inotropes weaned, on midodrine 20 TID now.  D/w cardiology - think additional cardiac improvement is unlikely.  Did tolerate HD yesterday with UF 1 which per I/Os would be appropriate to maintain euvolemia.  BP lower this AM and she was too fatigued yesterday to sit in chair.  At this point continue current care but in the coming days will need to see if she's overall strong enough to continue with dialysis and current therapies.     Atrial fibrillation: S/p cardioversion 10/5.  Remains in sinus rhythm.  On po amiodarone now.   CAD: On atorvastatin    Cont GOC discussion led by palliative and heart failure.  Plan continue dialysis as tolerated for now.    Fairy RONAL Sellar, MD [facility]

## 2024-07-02 NOTE — Progress Notes (Signed)
 Nutrition Follow-up  DOCUMENTATION CODES:   Non-severe (moderate) malnutrition in context of chronic illness  INTERVENTION:   Continue Regular diet; consider fluid restriction if consuming in excess  Continue Ensure Plus High Protein po TID-likes Chocolate, each supplement provides 350 kcal and 20 grams of protein  Continue Rena-Vite daily  May need to resume scheduled bowel regimen (until +BM) if pt remains constipated  NUTRITION DIAGNOSIS:   Moderate Malnutrition related to chronic illness as evidenced by mild fat depletion, moderate muscle depletion.  Continues but being addressed  GOAL:   Patient will meet greater than or equal to 90% of their needs   MONITOR:   PO intake, Diet advancement, Labs, Weight trends  REASON FOR ASSESSMENT:   Consult Assessment of nutrition requirement/status (CRRT)  ASSESSMENT:   73 yo female presents with progress weakness, sternal pressure, shortness of breath and decrease intake for many days and admitted for acute on chronic heart failure with respiratory failure with bilateral pleural effusions and hx of untreated OSA, AKI on CKD. PMH includes CAD, A.Fib,  biventricular HFrE s/p BiVen ICD, CKD 3a, DM  09/28 Admitted 09/30 AKI-worsening, CRRT initiated 10/10 CRRT discontinued 10/11 Tolerated iHD (3.5 hours with 1L net UF)  Pt tolerated iHD over the weekend-3.5 hours with 1L net UF, plan for next iHD on Tuesday. Remains on midodrine TID  Ate about 25-30% of breakfast this AM, drinking Ensure. Recorded po intake 0-75% of meals. RN reports pt ate well on Friday 10/10 but then experienced significant vomiting on Saturday 10/11 and did not eat anything.  Feels better today  Minimal UOP  Current wt 85.5 kg; weight 83.1 kg on 10/10 when CRRT discontinued  Last good BM on 10/10. Pt with prn meds for constipation; noted pt with orders for prn imodium as well. Pt has experienced both constipation and diarrhea this  admission  Labs: Sodium 128 (L) Potassium 4.2 (wdl)  Meds: Midodrine TID Rena-Vite daily Tigan prn Reglan  prn Zofran  prn Senokot prn Dulcolax prn Imodium prn Scopolamine  patch  Diet Order:   Diet Order             Diet regular Room service appropriate? Yes; Fluid consistency: Thin  Diet effective now                   EDUCATION NEEDS:   Education needs have been addressed  Skin:  Skin Assessment: Skin Integrity Issues: Skin Integrity Issues:: Other (Comment) Other: MASD to labia  Last BM:  10/11  Height:   Ht Readings from Last 1 Encounters:  06/28/24 5' 5 (1.651 m)    Weight:   Wt Readings from Last 1 Encounters:  07/02/24 85.5 kg     BMI:  Body mass index is 31.37 kg/m.  Estimated Nutritional Needs:   Kcal:  1700-1900 kcals  Protein:  100-125 g  Fluid:  1.5L   Betsey Finger MS, RDN, LDN, CNSC Registered Dietitian 3 Clinical Nutrition RD Inpatient Contact Info in Amion

## 2024-07-02 NOTE — Progress Notes (Signed)
No ICM remote transmission received for 07/02/2024 and next ICM transmission scheduled for 06/24/2024.

## 2024-07-02 NOTE — Progress Notes (Signed)
 Physical Therapy Treatment Patient Details Name: [name] MRN: [id number] DOB: 1950/12/12 Today's Date: 07/02/2024   History of Present Illness Pt is a 73 y/o F presenting to ED on 9/29 with chest discomfort and SOB. CT chest with R > L pleural effusions, Admitted for acute on chronic CHF and AKI, started on CRRT 9/30. S/p successful DCCV on 10/5. Off CRRT 10/9. PMH includes CAD, CHF s/p defibrillator, A fib on eliquis , pacemaker, anemia, arthritis, HTN, hypothyroidism, DM, cardiomyopathy.    PT Comments  Eager to get out of bed to recliner today.  Required up to min assist for bed mobility, sit to stand transfer, and step pivot transfer to recliner with RW for support. Educated on techniques throughout. +dizziness upon standing but unable to stand long enough to obtain BP. Symptoms resolve upon sitting. Supine BP 94/59 (MAP 71); seated in recilner, LEs dependent position, 89/55 (MAP 65). SpO2 95% on RA during session.Reviewed LE exercises and encouraged performing multiple times per day. OOB as tolerated with staff several times per day if vitals allow. Patient will continue to benefit from skilled physical therapy services to further improve independence with functional mobility.    If plan is discharge home, recommend the following: A little help with walking and/or transfers;A little help with bathing/dressing/bathroom;Assistance with cooking/housework;Assist for transportation   Can travel by private vehicle        Equipment Recommendations  Rolling walker (2 wheels);BSC/3in1;Wheelchair (measurements PT)    Recommendations for Other Services Rehab consult     Precautions / Restrictions Precautions Precautions: Fall Recall of Precautions/Restrictions: Impaired Precaution/Restrictions Comments: off CRRT 10/9, watch BP nephro wants to try to keep MAP >65 Restrictions Weight Bearing Restrictions Per Provider Order: No     Mobility  Bed Mobility Overal bed mobility: Needs  Assistance Bed Mobility: Supine to Sit     Supine to sit: HOB elevated, Min assist     General bed mobility comments: Able to to rise to EOB but needs min assist to scoot.    Transfers Overall transfer level: Needs assistance Equipment used: Rolling walker (2 wheels) Transfers: Sit to/from Stand, Bed to chair/wheelchair/BSC Sit to Stand: Min assist   Step pivot transfers: Min assist       General transfer comment: Min assist for boost to stand from edge of bed. Slow and effortful rise. Stablized with RW for support. Min assist for RW control and balance with step pivot to recliner. Complains of dizziness, could not tolerate standing long enough to check BP.    Ambulation/Gait               General Gait Details: Deferred due to dizziness, feeling too weak.   Stairs             Wheelchair Mobility     Tilt Bed    Modified Rankin (Stroke Patients Only)       Balance Overall balance assessment: Needs assistance Sitting-balance support: Feet supported Sitting balance-Leahy Scale: Good     Standing balance support: Bilateral upper extremity supported Standing balance-Leahy Scale: Poor Standing balance comment: external assist intermittently                            Communication Communication Communication: No apparent difficulties  Cognition Arousal: Alert Behavior During Therapy: WFL for tasks assessed/performed, Anxious   PT - Cognitive impairments: No apparent impairments  Following commands: Intact      Cueing Cueing Techniques: Verbal cues  Exercises General Exercises - Lower Extremity Ankle Circles/Pumps: AROM, Both, 10 reps, Supine Quad Sets: Strengthening, Both, 10 reps, Seated Gluteal Sets: Strengthening, Both, 10 reps, Seated Long Arc Quad: Both, 10 reps, Seated, Strengthening Hip Flexion/Marching: Both, 10 reps, Seated, Strengthening    General Comments General comments (skin  integrity, edema, etc.): Supine BP 94/59 (MAP 71); seated in recilner, LEs dependent position, 89/55 (MAP 65). SpO2 95% on RA during session.      Pertinent Vitals/Pain Pain Assessment Pain Assessment: No/denies pain    Home Living                          Prior Function            PT Goals (current goals can now be found in the care plan section) Acute Rehab PT Goals Patient Stated Goal: Get well go home PT Goal Formulation: With patient Time For Goal Achievement: 07/09/24 Potential to Achieve Goals: Fair Progress towards PT goals: Progressing toward goals    Frequency    Min 2X/week      PT Plan      Co-evaluation              AM-PAC PT 6 Clicks Mobility   Outcome Measure  Help needed turning from your back to your side while in a flat bed without using bedrails?: A Little Help needed moving from lying on your back to sitting on the side of a flat bed without using bedrails?: A Little Help needed moving to and from a bed to a chair (including a wheelchair)?: A Little Help needed standing up from a chair using your arms (e.g., wheelchair or bedside chair)?: A Little Help needed to walk in hospital room?: A Lot Help needed climbing 3-5 steps with a railing? : Total 6 Click Score: 15    End of Session Equipment Utilized During Treatment: Gait belt Activity Tolerance: Patient tolerated treatment well (Dizziness in standing, preventing gait.) Patient left: with call bell/phone within reach;in chair;with chair alarm set Nurse Communication: Mobility status PT Visit Diagnosis: Unsteadiness on feet (R26.81);Other abnormalities of gait and mobility (R26.89);Muscle weakness (generalized) (M62.81);Difficulty in walking, not elsewhere classified (R26.2)     Time: 8774-8755 PT Time Calculation (min) (ACUTE ONLY): 19 min  Charges:    $Therapeutic Activity: 8-22 mins PT General Charges $$ ACUTE PT VISIT: 1 Visit                     [name],  PT, DPT [facility]  Rehabilitation Services Physical Therapist Office: [phone] Website: [email or web address]    [name] 07/02/2024, 1:27 PM

## 2024-07-02 NOTE — Progress Notes (Signed)
 Pt alert and oriented. Vitals stable with HR 100s chronic yellow mews, socks and warm blankets applied.

## 2024-07-02 NOTE — Progress Notes (Signed)
   07/02/24 1500  Assess: MEWS Score  Temp (!) 97.5 F (36.4 C)  BP 94/61  MAP (mmHg) 72  Pulse Rate 93  ECG Heart Rate 93  Resp (!) 21  SpO2 98 %  O2 Device ETCO2 Nasal Cannula  Assess: MEWS Score  MEWS Temp 0  MEWS Systolic 1  MEWS Pulse 0  MEWS RR 1  MEWS LOC 0  MEWS Score 2  MEWS Score Color Yellow  Assess: if the MEWS score is Yellow or Red  Were vital signs accurate and taken at a resting state? Yes  Does the patient meet 2 or more of the SIRS criteria? No  MEWS guidelines implemented  No, previously yellow, continue vital signs every 4 hours  Assess: SIRS CRITERIA  SIRS Temperature  0  SIRS Respirations  1  SIRS Pulse 1  SIRS WBC 0  SIRS Score Sum  2

## 2024-07-02 NOTE — Plan of Care (Signed)

## 2024-07-03 DIAGNOSIS — Z515 Encounter for palliative care: Secondary | ICD-10-CM | POA: Diagnosis not present

## 2024-07-03 DIAGNOSIS — R531 Weakness: Secondary | ICD-10-CM | POA: Diagnosis not present

## 2024-07-03 DIAGNOSIS — N186 End stage renal disease: Secondary | ICD-10-CM | POA: Diagnosis not present

## 2024-07-03 DIAGNOSIS — I5023 Acute on chronic systolic (congestive) heart failure: Secondary | ICD-10-CM | POA: Diagnosis not present

## 2024-07-03 LAB — GLUCOSE, CAPILLARY
Glucose-Capillary: 247 mg/dL — ABNORMAL HIGH (ref 70–99)
Glucose-Capillary: 113 mg/dL — ABNORMAL HIGH (ref 70–99)
Glucose-Capillary: 160 mg/dL — ABNORMAL HIGH (ref 70–99)
Glucose-Capillary: 159 mg/dL — ABNORMAL HIGH (ref 70–99)

## 2024-07-03 LAB — CBC
HCT: 29 % — ABNORMAL LOW (ref 36.0–46.0)
Hemoglobin: 10.2 g/dL — ABNORMAL LOW (ref 12.0–15.0)
MCH: 27.3 pg (ref 26.0–34.0)
MCHC: 35.2 g/dL (ref 30.0–36.0)
MCV: 77.5 fL — ABNORMAL LOW (ref 80.0–100.0)
Platelets: 157 K/uL (ref 150–400)
RBC: 3.74 MIL/uL — ABNORMAL LOW (ref 3.87–5.11)
RDW: 18.9 % — ABNORMAL HIGH (ref 11.5–15.5)
WBC: 9.2 K/uL (ref 4.0–10.5)
nRBC: 0.5 % — ABNORMAL HIGH (ref 0.0–0.2)

## 2024-07-03 LAB — BASIC METABOLIC PANEL WITH GFR
Anion gap: 13 (ref 5–15)
BUN: 41 mg/dL — ABNORMAL HIGH (ref 8–23)
CO2: 22 mmol/L (ref 22–32)
Calcium: 8.7 mg/dL — ABNORMAL LOW (ref 8.9–10.3)
Chloride: 92 mmol/L — ABNORMAL LOW (ref 98–111)
Creatinine, Ser: 5.42 mg/dL — ABNORMAL HIGH (ref 0.44–1.00)
GFR, Estimated: 8 mL/min — ABNORMAL LOW (ref >60)
Glucose, Bld: 257 mg/dL — ABNORMAL HIGH (ref 70–99)
Potassium: 5 mmol/L (ref 3.5–5.1)
Sodium: 127 mmol/L — ABNORMAL LOW (ref 135–145)

## 2024-07-03 LAB — COOXEMETRY PANEL
Carboxyhemoglobin: 1.5 % (ref 0.5–1.5)
Carboxyhemoglobin: 1.8 % — ABNORMAL HIGH (ref 0.5–1.5)
Methemoglobin: <0.7 % (ref 0.0–1.5)
Methemoglobin: <0.7 % (ref 0.0–1.5)
O2 Saturation: 52.1 %
O2 Saturation: 55.2 %
Total hemoglobin: 10.6 g/dL — ABNORMAL LOW (ref 12.0–16.0)
Total hemoglobin: 11.2 g/dL — ABNORMAL LOW (ref 12.0–16.0)

## 2024-07-03 LAB — MAGNESIUM: Magnesium: 2 mg/dL (ref 1.7–2.4)

## 2024-07-03 LAB — HEMOGLOBIN A1C
Hgb A1c MFr Bld: 7.4 % — ABNORMAL HIGH (ref 4.8–5.6)
Mean Plasma Glucose: 165.68 mg/dL

## 2024-07-03 MED ORDER — HEPARIN SODIUM (PORCINE) 1000 UNIT/ML IJ SOLN
INTRAMUSCULAR | Status: AC
  Filled 2024-07-03: qty 2

## 2024-07-03 MED ORDER — CEFAZOLIN SODIUM-DEXTROSE 2-4 GM/100ML-% IV SOLN
2.0000 g | INTRAVENOUS | Status: AC
  Administered 2024-07-04: 2 g via INTRAVENOUS
  Filled 2024-07-03: qty 100

## 2024-07-03 MED ORDER — INSULIN GLARGINE-YFGN 100 UNIT/ML ~~LOC~~ SOLN: 20.0000 [IU] | Freq: Every day | SUBCUTANEOUS | Status: DC

## 2024-07-03 MED ORDER — GUAIFENESIN ER 600 MG PO TB12
600.0000 mg | ORAL_TABLET | Freq: Two times a day (BID) | ORAL | Status: DC
  Administered 2024-07-03 – 2024-07-12 (×19): 600 mg via ORAL
  Filled 2024-07-03 (×20): qty 1

## 2024-07-03 MED ORDER — INSULIN GLARGINE 100 UNIT/ML ~~LOC~~ SOLN
10.0000 [IU] | Freq: Every day | SUBCUTANEOUS | Status: DC
Start: 2024-07-03 — End: 2024-07-04
  Administered 2024-07-03 – 2024-07-04 (×2): 10 [IU] via SUBCUTANEOUS
  Filled 2024-07-03 (×3): qty 0.1

## 2024-07-03 NOTE — Progress Notes (Signed)
   Inpatient Rehabilitation Admissions Coordinator   I met with patient at bedside with friends. I await further progress with therapy and tolerance for dialysis treatments. Noted TOC SW working with son on SNF vs Home with HH.  Heron Leavell, RN, MSN Rehab Admissions Coordinator [phone] 07/03/2024 3:13 PM

## 2024-07-03 NOTE — Progress Notes (Signed)
 PROGRESS NOTE    [name]  FMW:[id number] DOB: 1950-11-11 DOA: 06/17/2024 PCP: Tobie Suzzane POUR, MD  73 y.o. with history of combined systolic and diastolic heart failure s/p BiV ICD, CAD, OSA on bipap, LBBB, HTN, hypotension, and atrial fibrillation.  - Admitted with volume overload, AKI - Echo 9/30 noted EF 20-25%, severe mitral regurgitation, moderately reduced RV - Right heart cath 9/30 noted elevated PA pressures, filling pressures, PA PI of 1.6 - Treated with inotropes and vasopressors in ICU, now off - Complicated by worsening renal failure, cardiorenal syndrome, failed high-dose diuretic challenge, then started on CRRT from 9/30-10/10 -sp cardioversion for A-fib -Palliative care consulted and following - 10/11 initiated hemodialysis - Transferred out of ICU to [facility] service today 10/14  Subjective: Patient seen on dialysis  Assessment and Plan:  Acute on chronic biventricular failure Cardiogenic shock -Admitted with cardiogenic shock, worsening renal failure and A-fib - Echo with EF 2025, moderately reduced RV, severe mitral regurgitation -RHC 9/30 noted elevated PA pressures wedge of 40, PA PI 1.6 - Treated with vasopressors, inotropes and high-dose diuretics - Underwent TEE/DCCV - With worsening renal failure started CRRT and now intermittent hemodialysis since 10/11 - Continue high-dose midodrine - Palliative care following -Toc following for SNF  Severe mitral regurgitation -Noted on echo this admission, not felt to be a mitral clip candidate  AKI on CKD - Cardiorenal, failed high-dose diuretics - Treated with CRRT 9/30-10/10 - Started on intermittent hemodialysis 10/11 - Nephrology following, needs HD Cath and CLIP  Hyponatremia - With mild worsening, monitor  Paroxysmal atrial fibrillation - New onset in August - Cardioverted 10/5 - Remains in sinus rhythm continue amiodarone and Eliquis   CAD -Stable at this time, no ACS on admission, continue  atorvastatin   Acute hypoxic respiratory failure - Secondary to above, improving  Type 2 diabetes mellitus - CBGs are uncontrolled, add Semglee , check HbA1c  Microcytic anemia - Stable, monitor   DVT prophylaxis: Apixaban  Code Status: Full code Family Communication: None present seen on dialysis Disposition Plan: To be determined, may need rehab  Consultants:    Procedures:   Antimicrobials:    Objective: Vitals:   07/03/24 0816 07/03/24 0830 07/03/24 0901 07/03/24 0931  BP: (!) 89/64 94/70 99/61  91/64  Pulse: 87 90 86 87  Resp: (!) 26 (!) 22 20 (!) 24  Temp:      TempSrc:      SpO2: 98% 99% 98% 98%  Weight:      Height:        Intake/Output Summary (Last 24 hours) at 07/03/2024 0955 Last data filed at 07/03/2024 0900 Gross per 24 hour  Intake 0 ml  Output 0 ml  Net 0 ml   Filed Weights   07/02/24 1500 07/03/24 0500 07/03/24 0808  Weight: 86.7 kg 86.7 kg 86.7 kg    Examination:   Chronically ill, AAO x 2 HEENT: Left IJ dialysis catheter CVS: S1-S2, regular rhythm Lungs: Decreased breath sounds at the bases Abdomen: Soft, nontender bowel sounds present Remedies: Trace edema   Data Reviewed:   CBC: Recent Labs  Lab 06/29/24 0538 06/30/24 0243 07/01/24 0435 07/02/24 0458 07/03/24 0518  WBC 6.2 7.2 9.0 7.4 9.2  HGB 9.4* 10.6* 11.3* 10.6* 10.2*  HCT 28.3* 31.8* 32.4* 30.6* 29.0*  MCV 83.2 82.4 79.0* 78.5* 77.5*  PLT 103* 139* 147* 145* 157   Basic Metabolic Panel: Recent Labs  Lab 06/27/24 1555 06/28/24 0534 06/28/24 1838 06/29/24 9461 06/30/24 0243 07/01/24 0435 07/02/24 0458 07/03/24 0518  NA 132* 131* 132* 133* 132* 132* 128* 127*  K 4.4 4.5 4.3 4.6 4.6 4.3 4.7 5.0  CL 98 97* 100 98 97* 96* 94* 92*  CO2 24 24 23 23 22 25 24 22   GLUCOSE 217* 238* 136* 137* 80 88 219* 257*  BUN 11 12 15 18  25* 16 28* 41*  CREATININE 1.49* 1.57* 1.95* 2.58* 3.56* 2.90* 4.21* 5.42*  CALCIUM  8.2* 8.5* 8.5* 8.5* 8.8* 8.5* 8.6* 8.7*  MG  --  2.2   --  2.2 2.1 1.9 2.0 2.0  PHOS 1.7* 2.3* 3.1 4.1 4.5  --   --   --    GFR: Estimated Creatinine Clearance: 9.8 mL/min (A) (by C-G formula based on SCr of 5.42 mg/dL (H)). Liver Function Tests: Recent Labs  Lab 06/27/24 1555 06/28/24 0534 06/28/24 1838 06/29/24 0538 06/30/24 0243  ALBUMIN 2.6* 2.6* 2.4* 2.4* 2.6*   No results for input(s): LIPASE, AMYLASE in the last 168 hours. No results for input(s): AMMONIA in the last 168 hours. Coagulation Profile: No results for input(s): INR, PROTIME in the last 168 hours. Cardiac Enzymes: No results for input(s): CKTOTAL, CKMB, CKMBINDEX, TROPONINI in the last 168 hours. BNP (last 3 results) No results for input(s): PROBNP in the last 8760 hours. HbA1C: No results for input(s): HGBA1C in the last 72 hours. CBG: Recent Labs  Lab 07/02/24 1131 07/02/24 1559 07/02/24 2108 07/02/24 2320 07/03/24 0532  GLUCAP 247* 256* 282* 249* 247*   Lipid Profile: No results for input(s): CHOL, HDL, LDLCALC, TRIG, CHOLHDL, LDLDIRECT in the last 72 hours. Thyroid  Function Tests: No results for input(s): TSH, T4TOTAL, FREET4, T3FREE, THYROIDAB in the last 72 hours. Anemia Panel: No results for input(s): VITAMINB12, FOLATE, FERRITIN, TIBC, IRON, RETICCTPCT in the last 72 hours. Urine analysis:    Component Value Date/Time   COLORURINE YELLOW 06/18/2024 1730   APPEARANCEUR CLEAR 06/18/2024 1730   LABSPEC 1.026 06/18/2024 1730   PHURINE 5.0 06/18/2024 1730   GLUCOSEU NEGATIVE 06/18/2024 1730   HGBUR NEGATIVE 06/18/2024 1730   BILIRUBINUR NEGATIVE 06/18/2024 1730   KETONESUR NEGATIVE 06/18/2024 1730   PROTEINUR NEGATIVE 06/18/2024 1730   UROBILINOGEN 0.2 04/13/2014 1612   NITRITE NEGATIVE 06/18/2024 1730   LEUKOCYTESUR NEGATIVE 06/18/2024 1730   Sepsis Labs: @LABRCNTIP (procalcitonin:4,lacticidven:4)  )No results found for this or any previous visit (from the past 240 hours).    Radiology Studies: No results found.   Scheduled Meds:  amiodarone  200 mg Oral Daily   apixaban   5 mg Oral BID   atorvastatin   80 mg Oral Daily   Chlorhexidine  Gluconate Cloth  6 each Topical Daily   Chlorhexidine  Gluconate Cloth  6 each Topical Q0600   feeding supplement  237 mL Oral TID BM   insulin  aspart  0-5 Units Subcutaneous QHS   insulin  aspart  0-6 Units Subcutaneous TID WC   levothyroxine   50 mcg Oral Q0600   midodrine  20 mg Oral Q8H   multivitamin  1 tablet Oral QHS   scopolamine   1 patch Transdermal Q72H   sodium chloride  flush  10-40 mL Intracatheter Q12H   sodium chloride  flush  3 mL Intravenous Q12H   sodium chloride  flush  3 mL Intravenous Q12H   Continuous Infusions:  anticoagulant sodium citrate       LOS: 16 days    Time spent:    Sigurd Pac, MD [facility]   07/03/2024, 9:55 AM

## 2024-07-03 NOTE — Plan of Care (Signed)
  Problem: Education: Goal: Ability to describe self-care measures that may prevent or decrease complications (Diabetes Survival Skills Education) will improve Outcome: Progressing Goal: Individualized Educational Video(s) Outcome: Progressing   Problem: Coping: Goal: Ability to adjust to condition or change in health will improve Outcome: Progressing   Problem: Fluid Volume: Goal: Ability to maintain a balanced intake and output will improve Outcome: Progressing   Problem: Health Behavior/Discharge Planning: Goal: Ability to identify and utilize available resources and services will improve Outcome: Progressing Goal: Ability to manage health-related needs will improve Outcome: Progressing   Problem: Metabolic: Goal: Ability to maintain appropriate glucose levels will improve Outcome: Progressing   Problem: Nutritional: Goal: Maintenance of adequate nutrition will improve Outcome: Progressing Goal: Progress toward achieving an optimal weight will improve Outcome: Progressing   Problem: Skin Integrity: Goal: Risk for impaired skin integrity will decrease Outcome: Progressing   Problem: Tissue Perfusion: Goal: Adequacy of tissue perfusion will improve Outcome: Progressing   Problem: Education: Goal: Ability to demonstrate management of disease process will improve Outcome: Progressing Goal: Ability to verbalize understanding of medication therapies will improve Outcome: Progressing Goal: Individualized Educational Video(s) Outcome: Progressing   Problem: Activity: Goal: Capacity to carry out activities will improve Outcome: Progressing   Problem: Cardiac: Goal: Ability to achieve and maintain adequate cardiopulmonary perfusion will improve Outcome: Progressing   Problem: Education: Goal: Knowledge of General Education information will improve Description: Including pain rating scale, medication(s)/side effects and non-pharmacologic comfort measures Outcome:  Progressing   Problem: Health Behavior/Discharge Planning: Goal: Ability to manage health-related needs will improve Outcome: Progressing   Problem: Clinical Measurements: Goal: Ability to maintain clinical measurements within normal limits will improve Outcome: Progressing Goal: Will remain free from infection Outcome: Progressing Goal: Diagnostic test results will improve Outcome: Progressing Goal: Respiratory complications will improve Outcome: Progressing Goal: Cardiovascular complication will be avoided Outcome: Progressing   Problem: Activity: Goal: Risk for activity intolerance will decrease Outcome: Progressing   Problem: Nutrition: Goal: Adequate nutrition will be maintained Outcome: Progressing   Problem: Coping: Goal: Level of anxiety will decrease Outcome: Progressing   Problem: Elimination: Goal: Will not experience complications related to bowel motility Outcome: Progressing Goal: Will not experience complications related to urinary retention Outcome: Progressing   Problem: Pain Managment: Goal: General experience of comfort will improve and/or be controlled Outcome: Progressing   Problem: Safety: Goal: Ability to remain free from injury will improve Outcome: Progressing   Problem: Skin Integrity: Goal: Risk for impaired skin integrity will decrease Outcome: Progressing   Problem: Education: Goal: Understanding of CV disease, CV risk reduction, and recovery process will improve Outcome: Progressing Goal: Individualized Educational Video(s) Outcome: Progressing   Problem: Activity: Goal: Ability to return to baseline activity level will improve Outcome: Progressing   Problem: Cardiovascular: Goal: Ability to achieve and maintain adequate cardiovascular perfusion will improve Outcome: Progressing Goal: Vascular access site(s) Level 0-1 will be maintained Outcome: Progressing   Problem: Health Behavior/Discharge Planning: Goal: Ability to  safely manage health-related needs after discharge will improve Outcome: Progressing

## 2024-07-03 NOTE — Progress Notes (Addendum)
 Occupational Therapy Treatment Patient Details Name: [name] MRN: [id number] DOB: 12-17-1950 Today's Date: 07/03/2024   History of present illness Pt is a 73 y/o F presenting to ED on 9/29 with chest discomfort and SOB. CT chest with R > L pleural effusions, Admitted for acute on chronic CHF and AKI, started on CRRT 9/30. S/p successful DCCV on 10/5. Off CRRT 10/9. PMH includes CAD, CHF s/p defibrillator, A fib on eliquis , pacemaker, anemia, arthritis, HTN, hypothyroidism, DM, cardiomyopathy.   OT comments  Pt progressing toward goals, with mild dizziness and limited by soft BP, RN notified. Pt needing mod A for pivot transfer to chair with HHA. Min A for bed mobility, and max A for LB ADL. Pt presenting with impairments listed below, will follow acutely. Patient will benefit from intensive inpatient follow-up therapy, >3 hours/day to maximize safety/ind with ADL/functional mobility.   BP start of session 87/57 (66) BP post chair transfer 89/49 (61)  Addendum 1703: Pt with continued soft BP and MAP <65, assisted RN with return to bed, BP 88/54 (67) SpO2 96% on 1L O2 upon return to bed/supine. Pt left in care of RN in room at end of session.        If plan is discharge home, recommend the following:  A little help with walking and/or transfers;A lot of help with bathing/dressing/bathroom;Assistance with cooking/housework;Assist for transportation;Help with stairs or ramp for entrance   Equipment Recommendations  BSC/3in1    Recommendations for Other Services PT consult;Rehab consult    Precautions / Restrictions Precautions Precautions: Fall Precaution/Restrictions Comments: watch BP, MAP >65 Restrictions Weight Bearing Restrictions Per Provider Order: No       Mobility Bed Mobility Overal bed mobility: Needs Assistance Bed Mobility: Supine to Sit     Supine to sit: HOB elevated, Min assist          Transfers Overall transfer level: Needs assistance Equipment  used: 1 person hand held assist Transfers: Bed to chair/wheelchair/BSC, Sit to/from Stand Sit to Stand: Mod assist Stand pivot transfers: Mod assist               Balance Overall balance assessment: Needs assistance Sitting-balance support: Feet supported Sitting balance-Leahy Scale: Good     Standing balance support: Bilateral upper extremity supported Standing balance-Leahy Scale: Poor Standing balance comment: external assist intermittently                           ADL either performed or assessed with clinical judgement   ADL                       Lower Body Dressing: Maximal assistance   Toilet Transfer: Moderate assistance;Stand-pivot;BSC/3in1 Statistician Details (indicate cue type and reason): simualted to chair with HHA         Functional mobility during ADLs: Moderate assistance      Extremity/Trunk Assessment Upper Extremity Assessment Upper Extremity Assessment: Generalized weakness   Lower Extremity Assessment Lower Extremity Assessment: Defer to PT evaluation        Vision   Vision Assessment?: No apparent visual deficits   Perception Perception Perception: Not tested   Praxis Praxis Praxis: Not tested   Communication Communication Communication: No apparent difficulties   Cognition Arousal: Alert Behavior During Therapy: WFL for tasks assessed/performed, Anxious Cognition: No apparent impairments  Following commands: Intact        Cueing   Cueing Techniques: Verbal cues  Exercises Exercises: Other exercises Other Exercises Other Exercises: BLE counterpressure techniques Other Exercises: BUE counterpressure techniques    Shoulder Instructions       General Comments see note for BP measures    Pertinent Vitals/ Pain       Pain Assessment Pain Assessment: No/denies pain  Home Living                                          Prior  Functioning/Environment              Frequency  Min 2X/week        Progress Toward Goals  OT Goals(current goals can now be found in the care plan section)  Progress towards OT goals: Progressing toward goals  Acute Rehab OT Goals Patient Stated Goal: none stated OT Goal Formulation: With patient Time For Goal Achievement: 07/09/24 Potential to Achieve Goals: Good ADL Goals Pt Will Perform Grooming: with supervision;standing Pt Will Perform Upper Body Dressing: with min assist;sitting;standing Pt Will Perform Lower Body Dressing: with min assist;sitting/lateral leans;sit to/from stand Pt Will Transfer to Toilet: with contact guard assist;ambulating;regular height toilet Additional ADL Goal #1: pt will tolerate OOB standing activity x10 min in order to improve activity tolerance for ADLs  Plan      Co-evaluation                 AM-PAC OT 6 Clicks Daily Activity     Outcome Measure   Help from another person eating meals?: A Little Help from another person taking care of personal grooming?: A Little Help from another person toileting, which includes using toliet, bedpan, or urinal?: A Lot Help from another person bathing (including washing, rinsing, drying)?: A Lot Help from another person to put on and taking off regular upper body clothing?: A Lot Help from another person to put on and taking off regular lower body clothing?: A Lot 6 Click Score: 14    End of Session Equipment Utilized During Treatment: Oxygen;Rolling walker (2 wheels)  OT Visit Diagnosis: Unsteadiness on feet (R26.81);Other abnormalities of gait and mobility (R26.89);Muscle weakness (generalized) (M62.81)   Activity Tolerance Patient tolerated treatment well   Patient Left in chair;with call bell/phone within reach;with chair alarm set   Nurse Communication Mobility status        Time: 8392-8369 OT Time Calculation (min): 23 min  Charges: OT General Charges $OT Visit: 1  Visit OT Treatments $Therapeutic Activity: 23-37 mins  [name], OTD, OTR/L SecureChat Preferred Acute Rehab [phone]   [name] 07/03/2024, 4:43 PM

## 2024-07-03 NOTE — Consult Note (Signed)
 Chief Complaint:  AKI on CKD  Procedure: Tunneled Hemodialysis Catheter placement  Referring Provider(s): Dr. Fairy Sellar  Supervising Physician: Hughes Simmonds  Patient Status: [name] - In-pt  History of Present Illness: [name] is a 73 y.o. female with a history of heart failure s/p biventricular implantable cardioverter-defibrillator, CAD, OSA on bipap, LBBB, A-fib on Eliquis , and CKD who is currently admitted with cardiogenic shock and worsening renal failure. Unfortunately, patient failed high dose diuretics and was started on CRRT from 9/30-10/10. She is currently undergoing intermittent hemodialysis with her most recent session earlier this morning, but continues to have worsening renal function. Given poor renal recovery at this time, IR with request for tunneled hemodialysis catheter placement for continued treatment.   Patient is currently resting in bed in no acute distress. States that she has felt very fatigued today, mostly because her dialysis session was so long. She denies any shortness of breath, chest pain, fevers/chills, abdominal pain, or lower extremity swelling. BUN/Cr 41/5.42 today > 28/4.21 yesterday; eGFR 8. All questions and concerns answered at the bedside.   Patient is Full Code  Past Medical History:  Diagnosis Date   A-fib (HCC) 06/12/2024   Anemia    Arthritis    CAD (coronary artery disease)    a. LHC (03/2014) Lmain: nl, LAD: diff dz proximal 20%, 30-40% dz mid vessel prior 2nd diagonal, LCx: 40% in OM1, 20-30% in OM2, RCA: 99% subtotal occlusion @ crux, TIMI 1 flow, L-R collaterals to distal vessel   Cardiomyopathy (HCC) 03/28/2014   LV dysfunction out of proportion to CAD 03/28/14   CHF (congestive heart failure) (HCC)    Phreesia 10/03/2020   Chronic systolic heart failure (HCC)    a. ECHO (03/2014): EF 25-30%, akinesis enteroanteroseptal myocardium, grade III DD b. RHC (03/2014) RA 4, RV 42/4, PA 44/14 (26), PCWP 21, PA 62% Fick CO/CI 6.9 /  3.4   Diabetes mellitus    Diabetes mellitus without complication (HCC)    Phreesia 10/03/2020   Duodenal ulcer    Age 75   Dyspnea    Erosive esophagitis    Gastritis    Hypertension    Hypothyroidism    Obese    Short-term memory loss    Thyroid  disease    TTP (thrombotic thrombocytopenic purpura) (HCC) 06/2005    Past Surgical History:  Procedure Laterality Date   BIV ICD INSERTION CRT-D N/A 05/03/2019   Procedure: BIV ICD INSERTION CRT-D;  Surgeon: Kelsie Agent, MD;  Location: [facility] INVASIVE CV LAB;  Service: Cardiovascular;  Laterality: N/A;   CARDIAC CATHETERIZATION     COLONOSCOPY  08/29/2012   MFM:Rnonwpr diverticulosis   COLONOSCOPY N/A 05/28/2015   Procedure: COLONOSCOPY;  Surgeon: Lamar CHRISTELLA Hollingshead, MD;  Location: [facility] ENDO SUITE;  Service: Endoscopy;  Laterality: N/A;  1015   ECTOPIC PREGNANCY SURGERY     ESOPHAGOGASTRODUODENOSCOPY  04/20/2012   WLM:Zmndpcz antral gastritis/Erosive reflux esophagitis   LEFT AND RIGHT HEART CATHETERIZATION WITH CORONARY ANGIOGRAM N/A 03/28/2014   Procedure: LEFT AND RIGHT HEART CATHETERIZATION WITH CORONARY ANGIOGRAM;  Surgeon: Peter M Swaziland, MD;  Location: [facility] CATH LAB;  Service: Cardiovascular;  Laterality: N/A;   RIGHT HEART CATH N/A 06/19/2024   Procedure: RIGHT HEART CATH;  Surgeon: Cherrie Toribio SAUNDERS, MD;  Location: [facility] INVASIVE CV LAB;  Service: Cardiovascular;  Laterality: N/A;   SPLENECTOMY  07/20/2005   TEMPORARY DIALYSIS CATHETER  06/19/2024   Procedure: TEMPORARY DIALYSIS CATHETER;  Surgeon: Cherrie Toribio SAUNDERS, MD;  Location: [facility] INVASIVE  CV LAB;  Service: Cardiovascular;;    Allergies: Other, Fentanyl , and Lisinopril   Medications: Prior to Admission medications   Medication Sig Start Date End Date Taking? Authorizing Provider  acetaminophen  (TYLENOL ) 500 MG tablet Take 1,000 mg by mouth every 6 (six) hours as needed for moderate pain. Patient taking differently: Take 1,000 mg by mouth as needed for moderate pain (pain  score 4-6).   Yes [provider]  albuterol  (PROVENTIL ) (2.5 MG/3ML) 0.083% nebulizer solution INHALE THE CONTENTS OF 1 VIAL VIA NEBULIZER EVERY 6 HOURS AS NEEDED FOR SHORTNESS OF BREATH OR WHEEZING 06/04/24  Yes Tobie Suzzane POUR, MD  apixaban  (ELIQUIS ) 5 MG TABS tablet Take 1 tablet (5 mg total) by mouth 2 (two) times daily. 05/15/24  Yes Terra Fairy PARAS, PA-C  atorvastatin  (LIPITOR ) 80 MG tablet TAKE 1 TABLET EVERY DAY 03/02/24  Yes Branch, Dorn FALCON, MD  calcium  carbonate (OSCAL) 1500 (600 Ca) MG TABS tablet Take 1 tablet by mouth daily with breakfast.   Yes [provider]  carvedilol  (COREG ) 6.25 MG tablet Take 1 tablet (6.25 mg total) by mouth 2 (two) times daily. 12/22/23  Yes BranchDorn FALCON, MD  furosemide  (LASIX ) 20 MG tablet TAKE 1-2 TABLETS (20-40MG  TOTAL) DAILY AS DIRECTED. ALTERNATE 40MG  WITH 20MG  DAILY (DIRECTIONS CHANGE) 05/16/24  Yes Branch, Dorn FALCON, MD  Glucagon  (GVOKE HYPOPEN  2-PACK) 1 MG/0.2ML SOAJ Inject 1 mg into the skin as needed (Blood glucose < 53). 06/22/23  Yes Tobie Suzzane POUR, MD  LANTUS  SOLOSTAR 100 UNIT/ML Solostar Pen INJECT 24 UNITS INTO THE SKIN 2 (TWO) TIMES DAILY. Patient taking differently: Inject 20 Units into the skin 2 (two) times daily. 12/19/23  Yes Tobie Suzzane POUR, MD  levothyroxine  (SYNTHROID ) 50 MCG tablet TAKE 1 TABLET EVERY DAY BEFORE BREAKFAST 12/12/23  Yes Patel, Rutwik K, MD  Multiple Vitamin (MULTIVITAMIN) tablet Take 1 tablet by mouth daily.   Yes [provider]  polyethylene glycol (MIRALAX  / GLYCOLAX ) packet Take 17 g by mouth daily as needed for mild constipation.   Yes [provider]  potassium chloride  (KLOR-CON ) 10 MEQ tablet Take 1 tablet (10 mEq total) by mouth daily. 06/12/24  Yes Terra Fairy PARAS, PA-C  spironolactone  (ALDACTONE ) 25 MG tablet TAKE 1 TABLET EVERY DAY 03/02/24  Yes Branch, Dorn FALCON, MD  blood glucose meter kit and supplies Dispense based on patient and insurance preference. Use up to four  times daily as directed.(FOR E11.40). 06/22/23   Tobie Suzzane POUR, MD  empagliflozin  (JARDIANCE ) 10 MG TABS tablet Take 1 tablet (10 mg total) by mouth daily before breakfast. Patient not taking: Reported on 06/17/2024 01/03/24   Alvan Dorn FALCON, MD  glipiZIDE  (GLUCOTROL ) 5 MG tablet Take 1 tablet (5 mg total) by mouth 2 (two) times daily before a meal. Patient not taking: Reported on 06/17/2024 08/04/23   Tobie Suzzane POUR, MD  Insulin  Pen Needle 31G X 5 MM MISC Use as directed for Lantus  and Novolog . 01/31/23   Tobie Suzzane POUR, MD  losartan  (COZAAR ) 25 MG tablet TAKE 1 TABLET EVERY DAY Patient not taking: Reported on 06/17/2024 03/02/24   Alvan Dorn FALCON, MD  Semaglutide , 1 MG/DOSE, (OZEMPIC , 1 MG/DOSE,) 4 MG/3ML SOPN INJECT 1MG  UNDER THE SKIN ONE TIME WEEKLY AS DIRECTED Patient not taking: Reported on 06/17/2024 05/14/24   Tobie Suzzane POUR, MD  TRUE METRIX BLOOD GLUCOSE TEST test strip TEST BLOOD SUGAR UP TO FOUR TIMES DAILY AS DIRECTED 10/26/23   Tobie Suzzane POUR, MD  TRUEplus Lancets 33G MISC  USE AS DIRECTED TO TEST BLOOD SUGAR UP TO FOUR TIMES DAILY 01/09/24   Tobie Suzzane POUR, MD     Family History  Problem Relation Age of Onset   Heart failure Mother    Cirrhosis Father        ETOH   Heart disease Brother    Diabetes Daughter    Breast cancer Maternal Aunt    Colon cancer Neg Hx     Social History   Socioeconomic History   Marital status: Single    Spouse name: Not on file   Number of children: 3   Years of education: 12   Highest education level: Some college, no degree  Occupational History   Occupation: disabled    Associate Professor: NOT EMPLOYED  Tobacco Use   Smoking status: Former    Current packs/day: 0.00    Average packs/day: 0.3 packs/day for 12.0 years (3.0 ttl pk-yrs)    Types: Cigarettes    Start date: 09/20/1966    Quit date: 09/20/1978    Years since quitting: 45.8   Smokeless tobacco: Never   Tobacco comments:    Former smoker 05/15/24  Vaping Use   Vaping status: Never  Used  Substance and Sexual Activity   Alcohol use: No    Alcohol/week: 0.0 standard drinks of alcohol   Drug use: No   Sexual activity: Not Currently  Other Topics Concern   Not on file  Social History Narrative   Lives Alone       Social Drivers of Health   Financial Resource Strain: Low Risk  (03/21/2024)   Overall Financial Resource Strain (CARDIA)    Difficulty of Paying Living Expenses: Not hard at all  Food Insecurity: No Food Insecurity (06/18/2024)   Hunger Vital Sign    Worried About Running Out of Food in the Last Year: Never true    Ran Out of Food in the Last Year: Never true  Recent Concern: Food Insecurity - Food Insecurity Present (04/23/2024)   Hunger Vital Sign    Worried About Running Out of Food in the Last Year: Never true    Ran Out of Food in the Last Year: Sometimes true  Transportation Needs: No Transportation Needs (06/18/2024)   PRAPARE - Administrator, Civil Service (Medical): No    Lack of Transportation (Non-Medical): No  Physical Activity: Sufficiently Active (03/21/2024)   Exercise Vital Sign    Days of Exercise per Week: 7 days    Minutes of Exercise per Session: 30 min  Stress: No Stress Concern Present (03/21/2024)   [employer] of Occupational Health - Occupational Stress Questionnaire    Feeling of Stress: Not at all  Social Connections: Moderately Integrated (06/18/2024)   Social Connection and Isolation Panel    Frequency of Communication with Friends and Family: More than three times a week    Frequency of Social Gatherings with Friends and Family: Twice a week    Attends Religious Services: More than 4 times per year    Active Member of [employer] or Organizations: Yes    Attends Engineer, structural: More than 4 times per year    Marital Status: Divorced     Review of Systems  Constitutional:  Positive for fatigue.  Patient denies any headache, chest pain, shortness of breath, abdominal pain, N/V, or fever/chills.  All other systems are negative.   Vital Signs: BP 96/63 (BP Location: Left Arm)   Pulse 89   Temp 98.3 F (36.8 C) (  Oral)   Resp 19   Ht 5' 5 (1.651 m)   Wt 191 lb 2.2 oz (86.7 kg)   SpO2 96%   BMI 31.81 kg/m     Physical Exam Constitutional:      Appearance: Normal appearance.  HENT:     Mouth/Throat:     Mouth: Mucous membranes are moist.     Pharynx: Oropharynx is clear.  Neck:     Comments: L internal jugular hemodialysis catheter in place Cardiovascular:     Rate and Rhythm: Normal rate and regular rhythm.     Heart sounds: Normal heart sounds.  Pulmonary:     Effort: Pulmonary effort is normal.     Comments: Breath sounds decreased throughout Abdominal:     General: Abdomen is flat.     Palpations: Abdomen is soft.     Tenderness: There is no abdominal tenderness.  Skin:    General: Skin is warm and dry.  Neurological:     Mental Status: She is alert and oriented to person, place, and time.  Psychiatric:        Behavior: Behavior normal.     Imaging: CARDIAC CATHETERIZATION Result Date: 06/19/2024 Findings: RA = 17 RV = 53/17 PA =  63/35 (46) PCW = 40 Fick cardiac output/index = 5.7/2.8 Thermo CO/CI = 3.7/1.8 PVR = 1.6 WU Ao sat = 93% PA sat = 63%, 57% PAPi = 1.6 Assessment: 1. Severe biventricular HF with severe volume overload and low output Plan/Discussion: Continue hemodynamic support. Start CVVHD. Toribio Fuel, MD 2:48 PM  PERIPHERAL VASCULAR CATHETERIZATION Result Date: 06/19/2024 Findings: RA = 17 RV = 53/17 PA =  63/35 (46) PCW = 40 Fick cardiac output/index = 5.7/2.8 Thermo CO/CI = 3.7/1.8 PVR = 1.6 WU Ao sat = 93% PA sat = 63%, 57% PAPi = 1.6 Assessment: 1. Severe biventricular HF with severe volume overload and low output Plan/Discussion: Continue hemodynamic support. Start CVVHD. Toribio Fuel, MD 2:48 PM  ECHOCARDIOGRAM COMPLETE Result Date: 06/18/2024    ECHOCARDIOGRAM REPORT   Patient Name:   JENNESSY SANDRIDGE Date of Exam: 06/18/2024  Medical Rec #:  [id number]       Height:       65.0 in Accession #:    [id number]      Weight:       206.4 lb Date of Birth:  13-Aug-1951        BSA:          2.005 m Patient Age:    73 years        BP:           83/60 mmHg Patient Gender: F               HR:           85 bpm. Exam Location:  Inpatient Procedure: 2D Echo and Intracardiac Opacification Agent (Both Spectral and Color            Flow Doppler were utilized during procedure). Indications:    CHF  History:        Patient has prior history of Echocardiogram examinations. CHF,                 CAD; Arrythmias:Atrial Fibrillation.  Sonographer:    Charmaine Gaskins Referring Phys: [id number] ROBIN FERNANDES IMPRESSIONS  1. Left ventricular ejection fraction, by estimation, is 20 to 25%. The left ventricle has severely decreased function. The left ventricle demonstrates global hypokinesis. The left ventricular internal cavity size was  moderately dilated. There is moderate eccentric left ventricular hypertrophy. Left ventricular diastolic function could not be evaluated. Elevated left atrial pressure. There is the interventricular septum is flattened in systole, consistent with right ventricular pressure overload.  2. Right ventricular systolic function is moderately reduced. The right ventricular size is normal. There is mildly elevated pulmonary artery systolic pressure.  3. Left atrial size was moderately dilated.  4. The mitral valve is degenerative. Severe mitral valve regurgitation. No evidence of mitral stenosis.  5. TR appears primarily lead related. Tricuspid valve regurgitation is severe.  6. The aortic valve is tricuspid. There is mild thickening of the aortic valve. Aortic valve regurgitation is not visualized. No aortic stenosis is present.  7. The inferior vena cava is dilated in size with <50% respiratory variability, suggesting right atrial pressure of 15 mmHg. Comparison(s): Changes from prior study are noted. Significant worsening of LV/RV function,  valvular disease. FINDINGS  Left Ventricle: Left ventricular ejection fraction, by estimation, is 20 to 25%. The left ventricle has severely decreased function. The left ventricle demonstrates global hypokinesis. Definity contrast agent was given IV to delineate the left ventricular endocardial borders. The left ventricular internal cavity size was moderately dilated. There is moderate eccentric left ventricular hypertrophy. The interventricular septum is flattened in systole, consistent with right ventricular pressure overload. Left ventricular diastolic function could not be evaluated due to mitral valve replacement. Left ventricular diastolic function could not be evaluated. Elevated left atrial pressure. Right Ventricle: The right ventricular size is normal. No increase in right ventricular wall thickness. Right ventricular systolic function is moderately reduced. There is mildly elevated pulmonary artery systolic pressure. The tricuspid regurgitant velocity is 2.40 m/s, and with an assumed right atrial pressure of 15 mmHg, the estimated right ventricular systolic pressure is 38.0 mmHg. Left Atrium: Left atrial size was moderately dilated. Right Atrium: Right atrial size was normal in size. Pericardium: There is no evidence of pericardial effusion. Mitral Valve: The mitral valve is degenerative in appearance. Severe mitral valve regurgitation. No evidence of mitral valve stenosis. MV peak gradient, 4.7 mmHg. The mean mitral valve gradient is 2.0 mmHg. Tricuspid Valve: TR appears primarily lead related. The tricuspid valve is normal in structure. Tricuspid valve regurgitation is severe. No evidence of tricuspid stenosis. Aortic Valve: The aortic valve is tricuspid. There is mild thickening of the aortic valve. Aortic valve regurgitation is not visualized. No aortic stenosis is present. Pulmonic Valve: The pulmonic valve was normal in structure. Pulmonic valve regurgitation is mild to moderate. No evidence of  pulmonic stenosis. Aorta: The aortic root is normal in size and structure. Venous: The inferior vena cava is dilated in size with less than 50% respiratory variability, suggesting right atrial pressure of 15 mmHg. IAS/Shunts: No atrial level shunt detected by color flow Doppler. Additional Comments: A device lead is visualized.  LEFT VENTRICLE PLAX 2D LVIDd:         6.20 cm      Diastology LVIDs:         5.40 cm      LV e' medial:    3.63 cm/s LV PW:         1.00 cm      LV E/e' medial:  29.4 LV IVS:        1.00 cm      LV e' lateral:   5.90 cm/s LVOT diam:     2.10 cm      LV E/e' lateral: 18.0 LVOT Area:     3.46  cm  LV Volumes (MOD) LV vol d, MOD A2C: 280.0 ml LV vol d, MOD A4C: 221.0 ml LV vol s, MOD A2C: 223.0 ml LV vol s, MOD A4C: 199.0 ml LV SV MOD A2C:     57.0 ml LV SV MOD A4C:     221.0 ml LV SV MOD BP:      40.6 ml RIGHT VENTRICLE RV Basal diam:  3.50 cm RV Mid diam:    3.10 cm RV S prime:     6.23 cm/s TAPSE (M-mode): 1.6 cm LEFT ATRIUM             Index        RIGHT ATRIUM           Index LA diam:        4.40 cm 2.19 cm/m   RA Area:     19.40 cm LA Vol (A2C):   79.4 ml 39.61 ml/m  RA Volume:   63.70 ml  31.77 ml/m LA Vol (A4C):   97.2 ml 48.48 ml/m LA Biplane Vol: 89.3 ml 44.54 ml/m   AORTA Ao Root diam: 2.70 cm Ao Asc diam:  3.10 cm MITRAL VALVE                TRICUSPID VALVE MV Area (PHT): 4.65 cm     TR Peak grad:   23.0 mmHg MV Peak grad:  4.7 mmHg     TR Vmax:        240.00 cm/s MV Mean grad:  2.0 mmHg MV Vmax:       1.08 m/s     SHUNTS MV Vmean:      67.5 cm/s    Systemic Diam: 2.10 cm MV Decel Time: 163 msec MV E velocity: 106.50 cm/s Morene Brownie Electronically signed by Morene Brownie Signature Date/Time: 06/18/2024/9:07:06 PM    Final    DG CHEST PORT 1 VIEW Result Date: 06/18/2024 CLINICAL DATA:  Status post PICC placement. EXAM: PORTABLE CHEST 1 VIEW COMPARISON:  Chest radiograph dated 06/09/2024. FINDINGS: Interval placement of a right-sided PICC. The tip of the PICC is poorly  visualized but close to the cavoatrial junction. There is cardiomegaly with vascular congestion. Small bilateral effusions and bibasilar atelectasis or infiltrate, new or progressed since the prior radiograph. No pneumothorax. Atherosclerotic calcification of the aorta. Left pectoral AICD device. Right hypoglossal nerve stimulator. No acute osseous pathology. IMPRESSION: 1. Interval placement of a right-sided PICC with tip close to the cavoatrial junction. 2. Cardiomegaly with vascular congestion. 3. Small bilateral effusions and bibasilar atelectasis or infiltrate. Electronically Signed   By: Vanetta Chou M.D.   On: 06/18/2024 19:04   US  EKG SITE RITE Result Date: 06/18/2024 If Site Rite image not attached, placement could not be confirmed due to current cardiac rhythm.  CT Angio Chest PE W and/or Wo Contrast Result Date: 06/17/2024 CLINICAL DATA:  Chest pain, short of breath EXAM: CT ANGIOGRAPHY CHEST WITH CONTRAST TECHNIQUE: Multidetector CT imaging of the chest was performed using the standard protocol during bolus administration of intravenous contrast. Multiplanar CT image reconstructions and MIPs were obtained to evaluate the vascular anatomy. RADIATION DOSE REDUCTION: This exam was performed according to the departmental dose-optimization program which includes automated exposure control, adjustment of the mA and/or kV according to patient size and/or use of iterative reconstruction technique. CONTRAST:  75mL OMNIPAQUE  IOHEXOL  350 MG/ML SOLN COMPARISON:  06/17/2024 FINDINGS: Cardiovascular: This is a technically adequate evaluation of the pulmonary vasculature. No filling defects or pulmonary emboli. The heart  is enlarged without pericardial effusion. Multi lead cardiac pacer/AICD is identified. Normal caliber of the thoracic aorta. Atherosclerosis of the aorta and coronary vasculature. Mediastinum/Nodes: Heterogeneous enlargement of the right lobe of the thyroid , previously evaluated by ultrasound  and fine-needle aspiration. Trachea and esophagus are unremarkable. No pathologic adenopathy. Lungs/Pleura: Right pleural effusion, volume estimated less than 1 L. Trace left pleural effusion. Patchy right basilar consolidation may reflect atelectasis or airspace disease. There is bibasilar bronchial wall thickening, with partial opacification of the lower lobe bronchial tree consistent with retained secretions or aspiration. No pneumothorax. Upper Abdomen: Subtle nodularity of the liver contour or and hypertrophy left lobe liver, suggesting underlying cirrhosis. Small volume ascites within the upper abdomen. Musculoskeletal: No acute or destructive bony abnormalities. Reconstructed images demonstrate no additional findings. Review of the MIP images confirms the above findings. IMPRESSION: 1. No evidence of pulmonary embolus. 2. Cardiomegaly without pericardial effusion. 3. Bilateral pleural effusions, right greater than left. 4. Bibasilar bronchial wall thickening, with segmental opacification of the lower lobe bronchial tree. Findings may reflect retained secretions or aspiration. 5. Patchy right basilar consolidation may reflect atelectasis or airspace disease. 6. Aortic Atherosclerosis (ICD10-I70.0). Coronary artery atherosclerosis. 7. Cirrhotic morphology of the liver, with small volume upper abdominal ascites. Electronically Signed   By: Ozell Daring M.D.   On: 06/17/2024 22:34   DG Chest 2 View Result Date: 06/17/2024 EXAM: 2 VIEW(S) XRAY OF THE CHEST 06/17/2024 06:48:41 PM COMPARISON: None available. CLINICAL HISTORY: sob, chest pain. Per chart - PT c/o onset chest pain, weakness and increased sob this morning. Pt scheduled for cardioversion on Tuesday to correct afib. FINDINGS: LINES, TUBES AND DEVICES: Neuro stimulator over right chest. Left subclavian approach cardiac rhythm maintenance device in place. LUNGS AND PLEURA: Bibasilar and left midlung airspace opacities favoring atelectasis. Pulmonary  vascular congestion. Small right pleural effusion. Elevated right hemidiaphragm. Trace left pleural effusion. No pneumothorax. HEART AND MEDIASTINUM: Cardiomegaly. Aortic atherosclerosis. BONES AND SOFT TISSUES: No acute osseous abnormality. IMPRESSION: 1. Cardiomegaly with pulmonary vascular congestion and small right pleural effusion; trace left pleural effusion. Electronically signed by: Norman Gatlin MD 06/17/2024 06:54 PM EDT RP Workstation: [id number]    Labs:  CBC: Recent Labs    06/30/24 0243 07/01/24 0435 07/02/24 0458 07/03/24 0518  WBC 7.2 9.0 7.4 9.2  HGB 10.6* 11.3* 10.6* 10.2*  HCT 31.8* 32.4* 30.6* 29.0*  PLT 139* 147* 145* 157    COAGS: Recent Labs    06/22/24 0425 06/23/24 0507 06/23/24 1739 06/24/24 0400  APTT 66* 65* 80* 87*    BMP: Recent Labs    06/30/24 0243 07/01/24 0435 07/02/24 0458 07/03/24 0518  NA 132* 132* 128* 127*  K 4.6 4.3 4.7 5.0  CL 97* 96* 94* 92*  CO2 22 25 24 22   GLUCOSE 80 88 219* 257*  BUN 25* 16 28* 41*  CALCIUM  8.8* 8.5* 8.6* 8.7*  CREATININE 3.56* 2.90* 4.21* 5.42*  GFRNONAA 13* 16* 10* 8*    LIVER FUNCTION TESTS: Recent Labs    02/17/24 1201 06/17/24 1813 06/19/24 1915 06/28/24 0534 06/28/24 1838 06/29/24 0538 06/30/24 0243  BILITOT 0.5 1.1  --   --   --   --   --   AST 15 14*  --   --   --   --   --   ALT 15 12  --   --   --   --   --   ALKPHOS 75 41  --   --   --   --   --  PROT 7.5 6.8  --   --   --   --   --   ALBUMIN 4.1 3.0*   < > 2.6* 2.4* 2.4* 2.6*   < > = values in this interval not displayed.    TUMOR MARKERS: No results for input(s): AFPTM, CEA, CA199, CHROMGRNA in the last 8760 hours.  Assessment and Plan:  AKI on CKD with poor renal recovery: KENITA BINES is a 73 y.o. female with a complicated cardiac history who was recently admitted with cardiogenic shock and AKI on CKD. She was started on CRRT and has recently transitioned to intermittent hemodialysis, but unfortunately  continues to have poor renal recovery. She currently has a [facility] temporary HD catheter placed in the ICU on 9/30.   -NPO at midnight -Abx ordered to radiology  -Continues to have poor renal recovery leading to request for tunneled HD placement -Plan for tunneled HD placement in IR on 10/15 if schedule allows  Thank you for allowing our service to participate in ZARYAH SECKEL 's care.    Electronically Signed: Jlon Betker M Angelyn Osterberg, PA-C   07/03/2024, 2:13 PM     I spent a total of 40 Minutes in face to face in clinical consultation, greater than 50% of which was counseling/coordinating care for tunneled HD catheter placement.

## 2024-07-03 NOTE — Progress Notes (Signed)
 Patient ID: [name], female   DOB: Jan 02, 1951, 73 y.o.   MRN: [id number] S: Seen while on dialysis.  Appears to be tolerating. O:BP 94/70 (BP Location: Left Arm)   Pulse 90   Temp (!) 97.3 F (36.3 C)   Resp (!) 22   Ht 5' 5 (1.651 m)   Wt 86.7 kg   SpO2 99%   BMI 31.81 kg/m   Intake/Output Summary (Last 24 hours) at 07/03/2024 0843 Last data filed at 07/02/2024 1739 Gross per 24 hour  Intake --  Output 0 ml  Net 0 ml   Intake/Output: I/O last 3 completed shifts: In: 180 [P.O.:180] Out: 0   Intake/Output this shift:  No intake/output data recorded. Weight change: 1.2 kg Gen: NAD CVS: RRR Resp:CTA Abd: +BS, soft, NT/ND Ext: trace pretibial edema  Recent Labs  Lab 06/26/24 1557 06/27/24 0453 06/27/24 1555 06/28/24 0534 06/28/24 1838 06/29/24 0538 06/30/24 0243 07/01/24 0435 07/02/24 0458 07/03/24 0518  NA 131* 130* 132* 131* 132* 133* 132* 132* 128* 127*  K 4.4 4.6 4.4 4.5 4.3 4.6 4.6 4.3 4.7 5.0  CL 96* 95* 98 97* 100 98 97* 96* 94* 92*  CO2 25 23 24 24 23 23 22 25 24  22  GLUCOSE 237* 268* 217* 238* 136* 137* 80 88 219* 257*  BUN 8 11 11 12 15 18  25* 16 28* 41*  CREATININE 1.54* 1.48* 1.49* 1.57* 1.95* 2.58* 3.56* 2.90* 4.21* 5.42*  ALBUMIN 2.8* 2.8* 2.6* 2.6* 2.4* 2.4* 2.6*  --   --   --   CALCIUM  8.1* 8.4* 8.2* 8.5* 8.5* 8.5* 8.8* 8.5* 8.6* 8.7*  PHOS 2.7 2.0* 1.7* 2.3* 3.1 4.1 4.5  --   --   --    Liver Function Tests: Recent Labs  Lab 06/28/24 1838 06/29/24 0538 06/30/24 0243  ALBUMIN 2.4* 2.4* 2.6*   No results for input(s): LIPASE, AMYLASE in the last 168 hours. No results for input(s): AMMONIA in the last 168 hours. CBC: Recent Labs  Lab 06/29/24 0538 06/30/24 0243 07/01/24 0435 07/02/24 0458 07/03/24 0518  WBC 6.2 7.2 9.0 7.4 9.2  HGB 9.4* 10.6* 11.3* 10.6* 10.2*  HCT 28.3* 31.8* 32.4* 30.6* 29.0*  MCV 83.2 82.4 79.0* 78.5* 77.5*  PLT 103* 139* 147* 145* 157   Cardiac Enzymes: No results for input(s): CKTOTAL,  CKMB, CKMBINDEX, TROPONINI in the last 168 hours. CBG: Recent Labs  Lab 07/02/24 1131 07/02/24 1559 07/02/24 2108 07/02/24 2320 07/03/24 0532  GLUCAP 247* 256* 282* 249* 247*    Iron Studies: No results for input(s): IRON, TIBC, TRANSFERRIN, FERRITIN in the last 72 hours. Studies/Results: No results found.  amiodarone  200 mg Oral Daily   apixaban   5 mg Oral BID   atorvastatin   80 mg Oral Daily   Chlorhexidine  Gluconate Cloth  6 each Topical Daily   Chlorhexidine  Gluconate Cloth  6 each Topical Q0600   feeding supplement  237 mL Oral TID BM   insulin  aspart  0-5 Units Subcutaneous QHS   insulin  aspart  0-6 Units Subcutaneous TID WC   levothyroxine   50 mcg Oral Q0600   midodrine  20 mg Oral Q8H   multivitamin  1 tablet Oral QHS   scopolamine   1 patch Transdermal Q72H   sodium chloride  flush  10-40 mL Intracatheter Q12H   sodium chloride  flush  3 mL Intravenous Q12H   sodium chloride  flush  3 mL Intravenous Q12H    BMET    Component Value Date/Time   NA  127 (L) 07/03/2024 0518   NA 138 06/12/2024 1638   K 5.0 07/03/2024 0518   CL 92 (L) 07/03/2024 0518   CO2 22 07/03/2024 0518   GLUCOSE 257 (H) 07/03/2024 0518   BUN 41 (H) 07/03/2024 0518   BUN 36 (H) 06/12/2024 1638   CREATININE 5.42 (H) 07/03/2024 0518   CREATININE 1.32 (H) 03/14/2020 1524   CALCIUM  8.7 (L) 07/03/2024 0518   GFRNONAA 8 (L) 07/03/2024 0518   GFRNONAA 40 (L) 03/14/2020 1524   GFRAA 47 (L) 03/14/2020 1524   CBC    Component Value Date/Time   WBC 9.2 07/03/2024 0518   RBC 3.74 (L) 07/03/2024 0518   HGB 10.2 (L) 07/03/2024 0518   HGB 12.0 06/12/2024 1638   HCT 29.0 (L) 07/03/2024 0518   HCT 37.1 06/12/2024 1638   PLT 157 07/03/2024 0518   PLT 214 06/12/2024 1638   MCV 77.5 (L) 07/03/2024 0518   MCV 89 06/12/2024 1638   MCH 27.3 07/03/2024 0518   MCHC 35.2 07/03/2024 0518   RDW 18.9 (H) 07/03/2024 0518   RDW 20.0 (H) 06/12/2024 1638   LYMPHSABS 1.0 06/13/2023 1356   MONOABS  0.6 04/30/2019 1403   EOSABS 0.3 06/13/2023 1356   BASOSABS 0.0 06/13/2023 1356    Assessment/Recommendations: [name] is a/an 73 y.o. female with a past medical history significant for systolic & diastolic heart failure s/p BiV ICD, CAD, OSA on BiPAP, LBBB, HTN, new Afib who presents to with Chest pain, SOB, cough.      Anuric AKI (not improving): Likely secondary to new RV heart failure, persistent atrial fibrillation, Cardiorenal Syndrome  -failed high dose diuretic challenge and CRRT initiated mainly for volume  Continued CRRT: 9/30-10/10 -did trial iHD 10/11 in setting of optimized hemodynamics; ok to use mid treatment midodrine for BP support but no plans for albumin or pressor support given not available outpt. UF goal 1L.  She did tolerate -plan next HD 10/14 pending course -Continue to monitor daily Cr, Dose meds for GFR<15 -Monitor Daily I/Os, Daily weight  -Maintain MAP>65 for optimal renal perfusion.  -Agree with holding RAASi, avoid further nephrotoxins including NSAIDS, Morphine .  Unless absolutely necessary, avoid CT with contrast and/or MRI with gadolinium.    - Will need TDC given lack of renal recovery at this time.  Will consult IR.    Hyponatremia: Mild, stable.     AoC BiV HF; New onset RV failure: pressors and inotropes weaned, on midodrine 20 TID now.  D/w cardiology - think additional cardiac improvement is unlikely.  Did tolerate HD yesterday with UF 1 which per I/Os would be appropriate to maintain euvolemia.  BP lower this AM and she was too fatigued yesterday to sit in chair.  At this point continue current care but in the coming days will need to see if she's overall strong enough to continue with dialysis and current therapies.     Atrial fibrillation: S/p cardioversion 10/5.  Remains in sinus rhythm.  On po amiodarone now.   CAD: On atorvastatin    Cont GOC discussion led by palliative and heart failure.  Plan continue dialysis as tolerated for now.     Christina RONAL Sellar, MD [employer] [phone]

## 2024-07-03 NOTE — Progress Notes (Signed)
 Patient ID: [name], female   DOB: 1951/07/11, 73 y.o.   MRN: [id number]    Progress Note from the Palliative Medicine Team at [facility]   Patient Name: [name]        Date: 07/03/2024 DOB: 1951-07-22  Age: 73 y.o. MRN#: [id number] Attending Physician: Fairy Frames, MD Primary Care Physician: Tobie Suzzane POUR, MD Admit Date: 06/17/2024   Reason for Consultation/Follow-up   Establishing Goals of Care   HPI/ Brief Hospital Review   73 y.o. female   admitted on 06/17/2024 with  medical history significant for CAD, persistent atrial fibrillation on Eliquis , chronic HFmrEF s/p CRT-D, LBBB, T2DM, HTN, HLD, CKD stage IIIa, hypothyroidism, OSA who presented to the ED for evaluation of chest pain, dyspnea, cough, increased swelling bilateral lower extremities  Hospitalization complicated by worsening renal function,  now on HD and tolerating this time.  Transferred out of ICU yesterday    Multiple comorbidities, is high risk for decompensation  Patient and her family face ongoing decisions regarding treatment options, advanced directives and anticipatory care needs.     Subjective  Extensive chart review has been completed prior to meeting with patient  including labs, vital signs, imaging, progress/consult notes, orders, medications and available advance directive documents.    This NP assessed patient at the bedside as a follow up for palliative medicine needs and emotional support. Alert and oriented, to HD this morning  I was able to speak with son/Brooks today by telephone.    Ongoing education regarding complex current medical situation.   All are hopeful for ongoing improvement.    In conversations regarding transition of care.  Son is not impressed with list of  facilities given to him to consider for OP short term rehab. He is exploring all options.  He recognizes his mother's increased care needs at this time.    Education offered on the importance of  ongoing conversation with patient and the medical providers regarding overall plan of care and treatment options to ensure decisions are within her values and goals of care.  PMT will continue to support holistically  Questions and concerns addressed   Discussed with nursing staff  Time: 25  minutes  Detailed review of medical records ( labs, imaging, vital signs), medically appropriate exam ( MS, skin, cardiac,  resp)   discussed with treatment team, counseling and education to patient, family, staff, documenting clinical information, medication management, coordination of care    Ronal Plants NP  Palliative Medicine Team Team Phone # [phone] Pager [phone]

## 2024-07-03 NOTE — Progress Notes (Signed)
 OT Cancellation Note  Patient Details Name: [name] MRN: [id number] DOB: 07/27/1951   Cancelled Treatment:    Reason Eval/Treat Not Completed: Patient at procedure or test/ unavailable (HD)  Ovie Eastep K, OTD, OTR/L SecureChat Preferred Acute Rehab [phone]   [name] 07/03/2024, 12:21 PM

## 2024-07-03 NOTE — Progress Notes (Signed)
   07/03/24 1200  Vitals  Temp 98.2 F (36.8 C)  Temp Source Oral  BP 90/62  MAP (mmHg) 71  BP Location Left Arm  BP Method Automatic  Patient Position (if appropriate) Lying  Pulse Rate 86  Pulse Rate Source Monitor  ECG Heart Rate 89  Resp (!) 23  Oxygen Therapy  SpO2 100 %  O2 Device Nasal Cannula  O2 Flow Rate (L/min) 1 L/min  During Treatment Monitoring  Blood Flow Rate (mL/min) 0 mL/min  Arterial Pressure (mmHg) -1.01 mmHg  Venous Pressure (mmHg) -2.02 mmHg  TMP (mmHg) -50.1 mmHg  Ultrafiltration Rate (mL/min) 624 mL/min  Dialysate Flow Rate (mL/min) 299 ml/min  Duration of HD Treatment -hour(s) 3.48 hour(s)  Cumulative Fluid Removed (mL) per Treatment  1484.43  Post Treatment  Dialyzer Clearance Lightly streaked  Liters Processed 83.4  Fluid Removed (mL) 1500 mL  Tolerated HD Treatment Yes  Post-Hemodialysis Comments no c/o tolerated well  Hemodialysis Catheter Left Internal jugular Triple lumen Temporary (Non-Tunneled)  Placement Date/Time: 06/19/24 1438   Placed prior to admission: No  Serial / Lot #: [id number]  Expiration Date: 05/20/25  Time Out: Correct patient;Correct site;Correct procedure  Maximum sterile barrier precautions: Hand hygiene;Cap;Large sterile shee...  Site Condition No complications  Blue Lumen Status Flushed;Heparin  locked  Red Lumen Status Flushed;Heparin  locked  Catheter fill solution Heparin  1000 units/ml  Catheter fill volume (Arterial) 1.4 cc  Catheter fill volume (Venous) 1.4  Dressing Type Transparent  Dressing Status Antimicrobial disc/dressing in place  Drainage Description None  Dressing Change Due 07/05/24  Post treatment catheter status Capped and Clamped   Received patient in bed to unit.  Alert and oriented.  Informed consent signed and in chart.   TX duration:3.5  Patient tolerated well.  Transported back to the room  Alert, without acute distress.  Hand-off given to patient's nurse.   Access used: L IJ Access  issues: none  Total UF removed: 1500 Medication(s) given: none Post HD VS: see above Post HD weight: n/a   Docia CHRISTELLA [facility]

## 2024-07-03 NOTE — Progress Notes (Signed)
 Advanced Heart Failure Rounding Note  HF Cardiologist: Dr. Cherrie  Chief Complaint: CHF Subjective:    RHC 9/30: RA 17, PA 63/35 (46), PCW 40, Fick 5.7/2.8, TD 3.7/1.8, PVR 1.6, PAPi 1.6.   Seen during HD session. Tolerating okay, SBP 90s. Able to stand and transfer with PT yesterday but tires out quickly. No shortness of breath.   Objective:    Weight Range: 86.7 kg Body mass index is 31.81 kg/m.   Vital Signs:   Temp:  [97.3 F (36.3 C)-98.2 F (36.8 C)] 97.3 F (36.3 C) (10/14 0808) Pulse Rate:  [84-99] 86 (10/14 0901) Resp:  [17-26] 20 (10/14 0901) BP: (86-99)/(52-70) 99/61 (10/14 0901) SpO2:  [91 %-99 %] 98 % (10/14 0901) Weight:  [86.7 kg] 86.7 kg (10/14 0808) Last BM Date : 06/30/24  Weight change: Filed Weights   07/02/24 1500 07/03/24 0500 07/03/24 0808  Weight: 86.7 kg 86.7 kg 86.7 kg   Intake/Output:  Intake/Output Summary (Last 24 hours) at 07/03/2024 0918 Last data filed at 07/03/2024 0900 Gross per 24 hour  Intake 0 ml  Output 0 ml  Net 0 ml   Physical Exam  General:  Fatigued, chronically ill appearing Neck: JVP 8-9 Cor: Regular rate & rhythm. No murmurs. Lungs: breathing nonlabored Abdomen: obese, soft, nontender, nondistended. Extremities: 1 + edema Neuro: alert & orientedx3. Affect pleasant   Tele: SR 80s  Labs    CBC Recent Labs    07/02/24 0458 07/03/24 0518  WBC 7.4 9.2  HGB 10.6* 10.2*  HCT 30.6* 29.0*  MCV 78.5* 77.5*  PLT 145* 157   Basic Metabolic Panel Recent Labs    89/86/74 0458 07/03/24 0518  NA 128* 127*  K 4.7 5.0  CL 94* 92*  CO2 24 22  GLUCOSE 219* 257*  BUN 28* 41*  CREATININE 4.21* 5.42*  CALCIUM  8.6* 8.7*  MG 2.0 2.0   Liver Function Tests No results for input(s): AST, ALT, ALKPHOS, BILITOT, PROT, ALBUMIN in the last 72 hours.  BNP (last 3 results) Recent Labs    06/17/24 1813  BNP 1,998.5*   Medications:    Scheduled Medications:  amiodarone  200 mg Oral Daily    apixaban   5 mg Oral BID   atorvastatin   80 mg Oral Daily   Chlorhexidine  Gluconate Cloth  6 each Topical Daily   Chlorhexidine  Gluconate Cloth  6 each Topical Q0600   feeding supplement  237 mL Oral TID BM   insulin  aspart  0-5 Units Subcutaneous QHS   insulin  aspart  0-6 Units Subcutaneous TID WC   levothyroxine   50 mcg Oral Q0600   midodrine  20 mg Oral Q8H   multivitamin  1 tablet Oral QHS   scopolamine   1 patch Transdermal Q72H   sodium chloride  flush  10-40 mL Intracatheter Q12H   sodium chloride  flush  3 mL Intravenous Q12H   sodium chloride  flush  3 mL Intravenous Q12H    Infusions:  anticoagulant sodium citrate      PRN Medications: acetaminophen  **OR** acetaminophen , albuterol , alteplase, anticoagulant sodium citrate, bisacodyl, calcium  carbonate, Gerhardt's butt cream, heparin , Influenza vac split trivalent PF, loperamide, metoCLOPramide  (REGLAN ) injection, ondansetron  **OR** ondansetron  (ZOFRAN ) IV, mouth rinse, senna-docusate, sodium chloride  flush, sodium chloride  flush, trimethobenzamide  Patient Profile   [name] is a 73 y.o.  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   Admit with acute on chronic  biventricular HF in setting of AF c/b acute renal failure.  Assessment/Plan   Acute on Chronic Biventricular Heart Failure  New onset RV Failure -> cardiogenic shock - Presented in cardiogenic shock, worsening renal failure, afib - echo 9/30: EF 20-25%, mod LVH, D-shaped septum, RV mod reduced, degenerative MV w/ severe MR - RHC 9/30: RA 17, PA 63/35 (46), PCW 40, Fick 5.7/2.8, TD 3.7/1.8, PVR 1.6, PAPi 1.6  - Now s/p TEE/DCCV off vasopressors, inotropes. - Continue midodrine 20 mg TID - tolerating HD okay. - Still concerned about her ability to function outside the hospital. Considering SNF for rehab on discharge. - Palliative care following, ongoing  discussions. nOt a candidate for advanced therapies.  Persistent Atrial Fibrillation - new onset 8/25 - Continue Eliquis  5 mg BID - Continue PO amiodarone - 10/5 S/P TEE/DC-CV -->SR - Maintaining SR  Severe MR - not noted on echo 5/23 - severe on echo 9/30 with degenerative valve - Not a clip candidate   Acute renal failure - cardio-renal  - Off CRRT - iHD per nephrology, tolerating today - No significant change in renal recovery.  - Continue bladder scans.    CAD - LHC in 2015 showed 99% RCA occlusion with collaterals - hs-trop 35>35, No ACS - continue atorva 80 mg daily  HF will follow at a distance. Will arrange for follow-up in HF clinic.  Meds Eliquis  5 mg BID Midodrine 20 mg TID Amiodarone 200 mg daily Atorvastatin  80 mg daily   Length of Stay: 16   Kalab Camps N, PA-C  07/03/2024, 9:18 AM  Advanced Heart Failure Team Pager [phone] (M-F; 7a - 5p)  Please contact [facility] for night-coverage after hours (5p -7a ) and weekends on [email or web address]

## 2024-07-04 ENCOUNTER — Inpatient Hospital Stay (HOSPITAL_COMMUNITY)

## 2024-07-04 DIAGNOSIS — I5023 Acute on chronic systolic (congestive) heart failure: Secondary | ICD-10-CM | POA: Diagnosis not present

## 2024-07-04 HISTORY — PX: IR TUNNELED CENTRAL VENOUS CATH PLC W IMG: IMG1939

## 2024-07-04 LAB — BASIC METABOLIC PANEL WITH GFR
Anion gap: 11 (ref 5–15)
BUN: 27 mg/dL — ABNORMAL HIGH (ref 8–23)
CO2: 24 mmol/L (ref 22–32)
Calcium: 8.4 mg/dL — ABNORMAL LOW (ref 8.9–10.3)
Chloride: 93 mmol/L — ABNORMAL LOW (ref 98–111)
Creatinine, Ser: 4.03 mg/dL — ABNORMAL HIGH (ref 0.44–1.00)
GFR, Estimated: 11 mL/min — ABNORMAL LOW
Glucose, Bld: 165 mg/dL — ABNORMAL HIGH (ref 70–99)
Potassium: 4.4 mmol/L (ref 3.5–5.1)
Sodium: 128 mmol/L — ABNORMAL LOW (ref 135–145)

## 2024-07-04 LAB — CBC
HCT: 31.2 % — ABNORMAL LOW (ref 36.0–46.0)
Hemoglobin: 10.8 g/dL — ABNORMAL LOW (ref 12.0–15.0)
MCH: 26.7 pg (ref 26.0–34.0)
MCHC: 34.6 g/dL (ref 30.0–36.0)
MCV: 77.2 fL — ABNORMAL LOW (ref 80.0–100.0)
Platelets: 164 K/uL (ref 150–400)
RBC: 4.04 MIL/uL (ref 3.87–5.11)
RDW: 19.1 % — ABNORMAL HIGH (ref 11.5–15.5)
WBC: 8.3 K/uL (ref 4.0–10.5)
nRBC: 0.5 % — ABNORMAL HIGH (ref 0.0–0.2)

## 2024-07-04 LAB — COOXEMETRY PANEL
Carboxyhemoglobin: 1.5 % (ref 0.5–1.5)
Methemoglobin: <0.7 % (ref 0.0–1.5)
O2 Saturation: 57.3 %
Total hemoglobin: 11.9 g/dL — ABNORMAL LOW (ref 12.0–16.0)

## 2024-07-04 LAB — GLUCOSE, CAPILLARY
Glucose-Capillary: 153 mg/dL — ABNORMAL HIGH (ref 70–99)
Glucose-Capillary: 114 mg/dL — ABNORMAL HIGH (ref 70–99)
Glucose-Capillary: 142 mg/dL — ABNORMAL HIGH (ref 70–99)
Glucose-Capillary: 118 mg/dL — ABNORMAL HIGH (ref 70–99)

## 2024-07-04 LAB — MAGNESIUM: Magnesium: 1.9 mg/dL (ref 1.7–2.4)

## 2024-07-04 MED ORDER — CEFAZOLIN SODIUM-DEXTROSE 2-4 GM/100ML-% IV SOLN
INTRAVENOUS | Status: AC
  Filled 2024-07-04: qty 100

## 2024-07-04 MED ORDER — MIDAZOLAM HCL 2 MG/2ML IJ SOLN
INTRAMUSCULAR | Status: AC
  Filled 2024-07-04: qty 2

## 2024-07-04 MED ORDER — MIDAZOLAM HCL 2 MG/2ML IJ SOLN
INTRAMUSCULAR | Status: AC | PRN
  Administered 2024-07-04: .5 mg via INTRAVENOUS

## 2024-07-04 MED ORDER — HEPARIN SODIUM (PORCINE) 1000 UNIT/ML IJ SOLN
INTRAMUSCULAR | Status: AC
  Filled 2024-07-04: qty 10

## 2024-07-04 MED ORDER — SODIUM CHLORIDE 0.9 % IV BOLUS
250.0000 mL | INTRAVENOUS | Status: AC
  Administered 2024-07-04: 250 mL via INTRAVENOUS

## 2024-07-04 MED ORDER — HEPARIN SODIUM (PORCINE) 1000 UNIT/ML IJ SOLN
10.0000 mL | Freq: Once | INTRAMUSCULAR | Status: AC
  Administered 2024-07-04: 3.2 mL

## 2024-07-04 MED ORDER — ALBUMIN HUMAN 25 % IV SOLN
25.0000 g | INTRAVENOUS | Status: AC
Start: 2024-07-04 — End: 2024-07-05
  Administered 2024-07-04: 25 g via INTRAVENOUS
  Filled 2024-07-04: qty 100

## 2024-07-04 MED ORDER — INSULIN GLARGINE-YFGN 100 UNIT/ML ~~LOC~~ SOLN
10.0000 [IU] | Freq: Every day | SUBCUTANEOUS | Status: DC
  Administered 2024-07-05 – 2024-07-14 (×10): 10 [IU] via SUBCUTANEOUS
  Filled 2024-07-04 (×11): qty 0.1

## 2024-07-04 MED ORDER — FENTANYL CITRATE (PF) 100 MCG/2ML IJ SOLN
INTRAMUSCULAR | Status: AC | PRN
  Administered 2024-07-04: 25 ug via INTRAVENOUS

## 2024-07-04 MED ORDER — FENTANYL CITRATE (PF) 100 MCG/2ML IJ SOLN
INTRAMUSCULAR | Status: AC
  Filled 2024-07-04: qty 2

## 2024-07-04 MED ORDER — MIDODRINE HCL 5 MG PO TABS
20.0000 mg | ORAL_TABLET | ORAL | Status: AC
  Administered 2024-07-04: 20 mg via ORAL
  Filled 2024-07-04: qty 4

## 2024-07-04 MED ORDER — LIDOCAINE-EPINEPHRINE 1 %-1:100000 IJ SOLN
20.0000 mL | Freq: Once | INTRAMUSCULAR | Status: AC
  Administered 2024-07-04: 20 mL

## 2024-07-04 MED ORDER — LIDOCAINE-EPINEPHRINE 1 %-1:100000 IJ SOLN
INTRAMUSCULAR | Status: AC
  Filled 2024-07-04: qty 1

## 2024-07-04 MED ORDER — CEFAZOLIN SODIUM-DEXTROSE 2-4 GM/100ML-% IV SOLN
INTRAVENOUS | Status: AC | PRN
  Administered 2024-07-04: 2 g via INTRAVENOUS

## 2024-07-04 NOTE — Progress Notes (Signed)
 PROGRESS NOTE    [name]  FMW:[id number] DOB: September 07, 1951 DOA: 06/17/2024 PCP: Tobie Suzzane POUR, MD  73 y.o. with history of combined systolic and diastolic heart failure s/p BiV ICD, CAD, OSA on bipap, LBBB, HTN, hypotension, and atrial fibrillation.  - Admitted with volume overload, AKI - Echo 9/30 noted EF 20-25%, severe MR, moderately reduced RV - RHC 9/30> elevated PA pressures and filling pressures, PA PI of 1.6 - Treated with inotropes and vasopressors in ICU, now off - Complicated by worsening renal failure, cardiorenal syndrome, failed high-dose diuretic challenge, then started on CRRT from 9/30-10/10 -sp cardioversion for A-fib -Palliative care consulted and following - 10/11 initiated hemodialysis - Transferred out of ICU to [facility] service 10/14  Subjective: - Feels fair, no events overnight, tolerated dialysis, n.p.o. for tunneled dialysis catheter  Assessment and Plan:  Acute on chronic biventricular failure Cardiogenic shock -Admitted with cardiogenic shock, worsening renal failure and A-fib - Echo with EF 2025, moderately reduced RV, severe mitral regurgitation -RHC 9/30 noted elevated PA pressures wedge of 40, PA PI 1.6 - Treated with vasopressors, inotropes and high-dose diuretics - Underwent TEE/DCCV - With worsening renal failure started CRRT and now intermittent hemodialysis since 10/11 - Continue high-dose midodrine - Palliative care following, d/w Ronal this am -Toc following for SNF  Severe mitral regurgitation -Noted on echo this admission, not felt to be a mitral clip candidate  AKI on CKD - Cardiorenal, failed high-dose diuretics - Treated with CRRT 9/30-10/10 - Started on intermittent hemodialysis 10/11 - Nephrology following, needs HD Cath and CLIP, planned for today  Hyponatremia - With mild worsening, monitor  Paroxysmal atrial fibrillation - New onset in August - Cardioverted 10/5 - Remains in sinus rhythm continue amiodarone and  Eliquis   CAD -Stable at this time, no ACS on admission, continue atorvastatin   Acute hypoxic respiratory failure - Secondary to above, improving  Type 2 diabetes mellitus - CBGs are uncontrolled, add Semglee , check HbA1c  Microcytic anemia - Stable, monitor   DVT prophylaxis: Apixaban  Code Status: Full code Family Communication: No family at bedside today Disposition Plan: Plan for SNF in few days  Consultants:    Procedures:   Antimicrobials:    Objective: Vitals:   07/04/24 0000 07/04/24 0540 07/04/24 0715 07/04/24 1105  BP:  97/67 97/71 90/62   Pulse:  89 95 90  Resp: 20 19 20    Temp:  98.9 F (37.2 C) 97.9 F (36.6 C) 97.7 F (36.5 C)  TempSrc:  Oral Oral Oral  SpO2:  100% 99% 93%  Weight:  85.8 kg    Height:        Intake/Output Summary (Last 24 hours) at 07/04/2024 1145 Last data filed at 07/04/2024 0810 Gross per 24 hour  Intake 476 ml  Output 1501 ml  Net -1025 ml   Filed Weights   07/03/24 0500 07/03/24 0808 07/04/24 0540  Weight: 86.7 kg 86.7 kg 85.8 kg    Examination:   Chronically ill, AAO x 2, no distress HEENT: Left IJ dialysis catheter CVS: S1-S2, regular rhythm Lungs: Decreased breath sounds at the bases Abdomen: Soft, nontender bowel sounds present Remedies: Trace edema   Data Reviewed:   CBC: Recent Labs  Lab 06/30/24 0243 07/01/24 0435 07/02/24 0458 07/03/24 0518 07/04/24 0540  WBC 7.2 9.0 7.4 9.2 8.3  HGB 10.6* 11.3* 10.6* 10.2* 10.8*  HCT 31.8* 32.4* 30.6* 29.0* 31.2*  MCV 82.4 79.0* 78.5* 77.5* 77.2*  PLT 139* 147* 145* 157 164   Basic Metabolic Panel:  Recent Labs  Lab 06/27/24 1555 06/27/24 1555 06/28/24 0534 06/28/24 1838 06/29/24 0538 06/30/24 0243 07/01/24 0435 07/02/24 0458 07/03/24 0518 07/04/24 0540  NA 132*  --  131* 132* 133* 132* 132* 128* 127* 128*  K 4.4  --  4.5 4.3 4.6 4.6 4.3 4.7 5.0 4.4  CL 98  --  97* 100 98 97* 96* 94* 92* 93*  CO2 24  --  24 23 23 22 25 24 22 24   GLUCOSE 217*   --  238* 136* 137* 80 88 219* 257* 165*  BUN 11  --  12 15 18  25* 16 28* 41* 27*  CREATININE 1.49*  --  1.57* 1.95* 2.58* 3.56* 2.90* 4.21* 5.42* 4.03*  CALCIUM  8.2*  --  8.5* 8.5* 8.5* 8.8* 8.5* 8.6* 8.7* 8.4*  MG  --    < > 2.2  --  2.2 2.1 1.9 2.0 2.0 1.9  PHOS 1.7*  --  2.3* 3.1 4.1 4.5  --   --   --   --    < > = values in this interval not displayed.   GFR: Estimated Creatinine Clearance: 13 mL/min (A) (by C-G formula based on SCr of 4.03 mg/dL (H)). Liver Function Tests: Recent Labs  Lab 06/27/24 1555 06/28/24 0534 06/28/24 1838 06/29/24 0538 06/30/24 0243  ALBUMIN 2.6* 2.6* 2.4* 2.4* 2.6*   No results for input(s): LIPASE, AMYLASE in the last 168 hours. No results for input(s): AMMONIA in the last 168 hours. Coagulation Profile: No results for input(s): INR, PROTIME in the last 168 hours. Cardiac Enzymes: No results for input(s): CKTOTAL, CKMB, CKMBINDEX, TROPONINI in the last 168 hours. BNP (last 3 results) No results for input(s): PROBNP in the last 8760 hours. HbA1C: Recent Labs    07/03/24 0518  HGBA1C 7.4*   CBG: Recent Labs  Lab 07/03/24 0532 07/03/24 1253 07/03/24 1603 07/03/24 2256 07/04/24 0617  GLUCAP 247* 113* 160* 159* 153*   Lipid Profile: No results for input(s): CHOL, HDL, LDLCALC, TRIG, CHOLHDL, LDLDIRECT in the last 72 hours. Thyroid  Function Tests: No results for input(s): TSH, T4TOTAL, FREET4, T3FREE, THYROIDAB in the last 72 hours. Anemia Panel: No results for input(s): VITAMINB12, FOLATE, FERRITIN, TIBC, IRON, RETICCTPCT in the last 72 hours. Urine analysis:    Component Value Date/Time   COLORURINE YELLOW 06/18/2024 1730   APPEARANCEUR CLEAR 06/18/2024 1730   LABSPEC 1.026 06/18/2024 1730   PHURINE 5.0 06/18/2024 1730   GLUCOSEU NEGATIVE 06/18/2024 1730   HGBUR NEGATIVE 06/18/2024 1730   BILIRUBINUR NEGATIVE 06/18/2024 1730   KETONESUR NEGATIVE 06/18/2024 1730   PROTEINUR  NEGATIVE 06/18/2024 1730   UROBILINOGEN 0.2 04/13/2014 1612   NITRITE NEGATIVE 06/18/2024 1730   LEUKOCYTESUR NEGATIVE 06/18/2024 1730   Sepsis Labs: @LABRCNTIP (procalcitonin:4,lacticidven:4)  )No results found for this or any previous visit (from the past 240 hours).   Radiology Studies: No results found.   Scheduled Meds:  amiodarone  200 mg Oral Daily   apixaban   5 mg Oral BID   atorvastatin   80 mg Oral Daily   Chlorhexidine  Gluconate Cloth  6 each Topical Daily   Chlorhexidine  Gluconate Cloth  6 each Topical Q0600   feeding supplement  237 mL Oral TID BM   guaiFENesin   600 mg Oral BID   insulin  aspart  0-5 Units Subcutaneous QHS   insulin  aspart  0-6 Units Subcutaneous TID WC   insulin  glargine  10 Units Subcutaneous Daily   levothyroxine   50 mcg Oral Q0600   midodrine  20 mg  Oral Q8H   multivitamin  1 tablet Oral QHS   scopolamine   1 patch Transdermal Q72H   sodium chloride  flush  10-40 mL Intracatheter Q12H   sodium chloride  flush  3 mL Intravenous Q12H   sodium chloride  flush  3 mL Intravenous Q12H   Continuous Infusions:     LOS: 17 days    Time spent:    Sigurd Pac, MD [facility]   07/04/2024, 11:45 AM

## 2024-07-04 NOTE — Progress Notes (Signed)
 Physical Therapy Treatment Patient Details Name: [name] MRN: [id number] DOB: [birth year] Today's Date: 07/04/2024   History of Present Illness Pt is a 73 y/o F presenting to ED on 9/29 with chest discomfort and SOB. CT chest with R > L pleural effusions, Admitted for acute on chronic CHF and AKI, started on CRRT 9/30. S/p successful DCCV on 10/5. Off CRRT 10/9. PMH includes CAD, CHF s/p defibrillator, A fib on eliquis , pacemaker, anemia, arthritis, HTN, hypothyroidism, DM, cardiomyopathy.    PT Comments  Pt agreeable to session despite fatigue. With mobility, pt continues to demo good ability to initiate bed mobility, but needs minA to complete movement of LE and trunk and significant time to complete scooting to EOB. Pt then needing modA to complete sit-stand transfers and short bout of ambulation in the room (3 ft x2) this session, all limited by fatigue. Pt with less reports of dizziness, BP soft but MAP >65 throughout (see values below). Will continue efforts acutely, recommendations for intensive therapies to facilitate return to prior level of independence remain appropriate.   VITALS:  - supine in bed- BP: 98/63 (74); HR: 91bpm - sitting EOB - BP: 92/67 (76); HR: 95bpm - standing - BP: 104/87 (93); HR: 99bpm - sitting in recliner after transfer - BP: 95/54 (67); HR: 85bpm - sitting in bed HOB 36 deg - 95/61 (73); HR: 97bpm   If plan is discharge home, recommend the following: A little help with walking and/or transfers;A little help with bathing/dressing/bathroom;Assistance with cooking/housework;Assist for transportation   Can travel by private vehicle        Equipment Recommendations  Rolling walker (2 wheels);BSC/3in1;Wheelchair (measurements PT)    Recommendations for Other Services       Precautions / Restrictions Precautions Precautions: Fall Recall of Precautions/Restrictions: Impaired Precaution/Restrictions Comments: off CRRT 10/9, watch BP nephro wants to try  to keep MAP >65 Restrictions Weight Bearing Restrictions Per Provider Order: No     Mobility  Bed Mobility Overal bed mobility: Needs Assistance Bed Mobility: Supine to Sit     Supine to sit: HOB elevated, Min assist Sit to supine: Min assist   General bed mobility comments: minA to LE and to elevate trunk, increased time to scoot to EOB. minA to bring LE back into bed    Transfers Overall transfer level: Needs assistance Equipment used: 2 person hand held assist Transfers: Sit to/from Stand, Bed to chair/wheelchair/BSC Sit to Stand: Mod assist, +2 physical assistance   Step pivot transfers: Mod assist, +2 physical assistance       General transfer comment: modA to power up from EOB and recliner, limited power in LE, dependent on pulling up on UE. limited to x3 reps in session    Ambulation/Gait Ambulation/Gait assistance: Mod assist, +2 physical assistance Gait Distance (Feet): 3 Feet (+ 3 ft) Assistive device: 2 person hand held assist Gait Pattern/deviations: Step-to pattern, Decreased stride length, Shuffle, Trunk flexed Gait velocity: decreased Gait velocity interpretation: <1.31 ft/sec, indicative of household ambulator   General Gait Details: small steps with minimal clearance, heavy dependence on BUE support, limited activity tolerance due to fatigue. BP to low of 95/54 (67) after mobility   Stairs             Wheelchair Mobility     Tilt Bed    Modified Rankin (Stroke Patients Only)       Balance Overall balance assessment: Needs assistance Sitting-balance support: Feet supported Sitting balance-Leahy Scale: Good     Standing balance  support: Bilateral upper extremity supported Standing balance-Leahy Scale: Poor Standing balance comment: modA of 2 to maintain standing                            Communication Communication Communication: No apparent difficulties  Cognition Arousal: Alert Behavior During Therapy: WFL for  tasks assessed/performed, Flat affect   PT - Cognitive impairments: No apparent impairments                       PT - Cognition Comments: pt with flat affect, slow to answer all questions but answering appropriately Following commands: Impaired Following commands impaired: Follows one step commands with increased time    Cueing Cueing Techniques: Verbal cues  Exercises General Exercises - Lower Extremity Ankle Circles/Pumps: AROM, Both, 10 reps, Supine Heel Slides: Strengthening, Both, 10 reps, Supine Hip Flexion/Marching: Both, 10 reps, Strengthening, Supine    General Comments General comments (skin integrity, edema, etc.): BP soft but MAP >65      Pertinent Vitals/Pain Pain Assessment Pain Assessment: No/denies pain     PT Goals (current goals can now be found in the care plan section) Acute Rehab PT Goals Patient Stated Goal: Get well go home PT Goal Formulation: With patient Time For Goal Achievement: 07/09/24 Potential to Achieve Goals: Fair Progress towards PT goals: Progressing toward goals    Frequency    Min 2X/week       AM-PAC PT 6 Clicks Mobility   Outcome Measure  Help needed turning from your back to your side while in a flat bed without using bedrails?: A Little Help needed moving from lying on your back to sitting on the side of a flat bed without using bedrails?: A Little Help needed moving to and from a bed to a chair (including a wheelchair)?: A Lot Help needed standing up from a chair using your arms (e.g., wheelchair or bedside chair)?: A Lot Help needed to walk in hospital room?: Total Help needed climbing 3-5 steps with a railing? : Total 6 Click Score: 12    End of Session Equipment Utilized During Treatment: Gait belt Activity Tolerance: Patient tolerated treatment well (soft BP with mobility, limited by fatigue) Patient left: with Best bell/phone within reach;in chair;with chair alarm set Nurse Communication: Mobility  status PT Visit Diagnosis: Unsteadiness on feet (R26.81);Other abnormalities of gait and mobility (R26.89);Muscle weakness (generalized) (M62.81);Difficulty in walking, not elsewhere classified (R26.2)     Time: 9046-8985 PT Time Calculation (min) (ACUTE ONLY): 21 min  Charges:    $Therapeutic Exercise: 8-22 mins PT General Charges $$ ACUTE PT VISIT: 1 Visit                     [name], PT, DPT   Acute Rehabilitation Department Office [phone] Secure Chat Communication Preferred   [name] 07/04/2024, 10:56 AM

## 2024-07-04 NOTE — Progress Notes (Signed)
 Patient ID: [name], female   DOB: 10-09-1950, 73 y.o.   MRN: [id number] S: No new complaints and tolerated IHD yesterday with 1.5L UF.   O:BP 97/71 (BP Location: Right Arm)   Pulse 95   Temp 97.9 F (36.6 C) (Oral)   Resp 20   Ht 5' 5 (1.651 m)   Wt 85.8 kg   SpO2 99%   BMI 31.48 kg/m   Intake/Output Summary (Last 24 hours) at 07/04/2024 1025 Last data filed at 07/04/2024 0810 Gross per 24 hour  Intake 476 ml  Output 1501 ml  Net -1025 ml   Intake/Output: I/O last 3 completed shifts: In: 476 [P.O.:473; I.V.:3] Out: 1501 [Other:1500; Stool:1]  Intake/Output this shift:  No intake/output data recorded. Weight change: 0 kg Gen: fatigued but in NAD CVS: RRR Resp:CTA Abd: +BS, soft, NT/ND Ext: no edema  Recent Labs  Lab 06/27/24 1555 06/28/24 0534 06/28/24 1838 06/29/24 0538 06/30/24 0243 07/01/24 0435 07/02/24 0458 07/03/24 0518 07/04/24 0540  NA 132* 131* 132* 133* 132* 132* 128* 127* 128*  K 4.4 4.5 4.3 4.6 4.6 4.3 4.7 5.0 4.4  CL 98 97* 100 98 97* 96* 94* 92* 93*  CO2 24 24 23 23 22 25 24 22 24   GLUCOSE 217* 238* 136* 137* 80 88 219* 257* 165*  BUN 11 12 15 18  25* 16 28* 41* 27*  CREATININE 1.49* 1.57* 1.95* 2.58* 3.56* 2.90* 4.21* 5.42* 4.03*  ALBUMIN 2.6* 2.6* 2.4* 2.4* 2.6*  --   --   --   --   CALCIUM  8.2* 8.5* 8.5* 8.5* 8.8* 8.5* 8.6* 8.7* 8.4*  PHOS 1.7* 2.3* 3.1 4.1 4.5  --   --   --   --    Liver Function Tests: Recent Labs  Lab 06/28/24 1838 06/29/24 0538 06/30/24 0243  ALBUMIN 2.4* 2.4* 2.6*   No results for input(s): LIPASE, AMYLASE in the last 168 hours. No results for input(s): AMMONIA in the last 168 hours. CBC: Recent Labs  Lab 06/30/24 0243 07/01/24 0435 07/02/24 0458 07/03/24 0518 07/04/24 0540  WBC 7.2 9.0 7.4 9.2 8.3  HGB 10.6* 11.3* 10.6* 10.2* 10.8*  HCT 31.8* 32.4* 30.6* 29.0* 31.2*  MCV 82.4 79.0* 78.5* 77.5* 77.2*  PLT 139* 147* 145* 157 164   Cardiac Enzymes: No results for input(s): CKTOTAL,  CKMB, CKMBINDEX, TROPONINI in the last 168 hours. CBG: Recent Labs  Lab 07/03/24 0532 07/03/24 1253 07/03/24 1603 07/03/24 2256 07/04/24 0617  GLUCAP 247* 113* 160* 159* 153*    Iron Studies: No results for input(s): IRON, TIBC, TRANSFERRIN, FERRITIN in the last 72 hours. Studies/Results: No results found.  amiodarone  200 mg Oral Daily   apixaban   5 mg Oral BID   atorvastatin   80 mg Oral Daily   Chlorhexidine  Gluconate Cloth  6 each Topical Daily   Chlorhexidine  Gluconate Cloth  6 each Topical Q0600   feeding supplement  237 mL Oral TID BM   guaiFENesin   600 mg Oral BID   insulin  aspart  0-5 Units Subcutaneous QHS   insulin  aspart  0-6 Units Subcutaneous TID WC   insulin  glargine  10 Units Subcutaneous Daily   levothyroxine   50 mcg Oral Q0600   midodrine  20 mg Oral Q8H   multivitamin  1 tablet Oral QHS   scopolamine   1 patch Transdermal Q72H   sodium chloride  flush  10-40 mL Intracatheter Q12H   sodium chloride  flush  3 mL Intravenous Q12H   sodium chloride  flush  3  mL Intravenous Q12H    BMET    Component Value Date/Time   NA 128 (L) 07/04/2024 0540   NA 138 06/12/2024 1638   K 4.4 07/04/2024 0540   CL 93 (L) 07/04/2024 0540   CO2 24 07/04/2024 0540   GLUCOSE 165 (H) 07/04/2024 0540   BUN 27 (H) 07/04/2024 0540   BUN 36 (H) 06/12/2024 1638   CREATININE 4.03 (H) 07/04/2024 0540   CREATININE 1.32 (H) 03/14/2020 1524   CALCIUM  8.4 (L) 07/04/2024 0540   GFRNONAA 11 (L) 07/04/2024 0540   GFRNONAA 40 (L) 03/14/2020 1524   GFRAA 47 (L) 03/14/2020 1524   CBC    Component Value Date/Time   WBC 8.3 07/04/2024 0540   RBC 4.04 07/04/2024 0540   HGB 10.8 (L) 07/04/2024 0540   HGB 12.0 06/12/2024 1638   HCT 31.2 (L) 07/04/2024 0540   HCT 37.1 06/12/2024 1638   PLT 164 07/04/2024 0540   PLT 214 06/12/2024 1638   MCV 77.2 (L) 07/04/2024 0540   MCV 89 06/12/2024 1638   MCH 26.7 07/04/2024 0540   MCHC 34.6 07/04/2024 0540   RDW 19.1 (H) 07/04/2024  0540   RDW 20.0 (H) 06/12/2024 1638   LYMPHSABS 1.0 06/13/2023 1356   MONOABS 0.6 04/30/2019 1403   EOSABS 0.3 06/13/2023 1356   BASOSABS 0.0 06/13/2023 1356    Assessment/Recommendations: MILDRETH REEK is a/an 73 y.o. female with a past medical history significant for systolic & diastolic heart failure s/p BiV ICD, CAD, OSA on BiPAP, LBBB, HTN, new Afib who presents to with Chest pain, SOB, cough.      Anuric AKI (not improving): Likely secondary to new RV heart failure, persistent atrial fibrillation, Cardiorenal Syndrome  -failed high dose diuretic challenge and CRRT initiated mainly for volume  Continued CRRT: 9/30-10/10 -did trial iHD 10/11 in setting of optimized hemodynamics; ok to use mid treatment midodrine for BP support but no plans for albumin or pressor support given not available outpt. UF goal 1L.  She did tolerate IHD despite low bp's. -plan next HD 10/16 pending course -Continue to monitor daily Cr, Dose meds for GFR<15 -Monitor Daily I/Os, Daily weight  -Maintain MAP>65 for optimal renal perfusion.  -Agree with holding RAASi, avoid further nephrotoxins including NSAIDS, Morphine .  Unless absolutely necessary, avoid CT with contrast and/or MRI with gadolinium.    - Plan for [facility] today given lack of renal recovery at this time.  Appreciate IR assistance.    Hyponatremia: Mild, stable.     AoC BiV HF; New onset RV failure: pressors and inotropes weaned, on midodrine 20 TID now.  D/w cardiology - think additional cardiac improvement is unlikely.  Did tolerate HD yesterday with UF 1.5L which per I/Os would be appropriate to maintain euvolemia.  BP lower this AM and she was too fatigued yesterday to sit in chair.  At this point continue current care but in the coming days will need to see if she's overall strong enough to continue with dialysis and current therapies.     Atrial fibrillation: S/p cardioversion 10/5.  Remains in sinus rhythm.  On po amiodarone now.   CAD: On  atorvastatin    Cont GOC discussion led by palliative and heart failure.  Plan continue dialysis as tolerated for now.      Fairy RONAL Sellar, MD [facility]

## 2024-07-05 DIAGNOSIS — I5023 Acute on chronic systolic (congestive) heart failure: Secondary | ICD-10-CM | POA: Diagnosis not present

## 2024-07-05 LAB — CBC WITH DIFFERENTIAL/PLATELET
Abs Immature Granulocytes: 0.05 K/uL (ref 0.00–0.07)
Basophils Absolute: 0 K/uL (ref 0.0–0.1)
Basophils Relative: 1 %
Eosinophils Absolute: 0 K/uL (ref 0.0–0.5)
Eosinophils Relative: 1 %
HCT: 28.8 % — ABNORMAL LOW (ref 36.0–46.0)
Hemoglobin: 9.8 g/dL — ABNORMAL LOW (ref 12.0–15.0)
Immature Granulocytes: 1 %
Lymphocytes Relative: 9 %
Lymphs Abs: 0.6 K/uL — ABNORMAL LOW (ref 0.7–4.0)
MCH: 26.3 pg (ref 26.0–34.0)
MCHC: 34 g/dL (ref 30.0–36.0)
MCV: 77.4 fL — ABNORMAL LOW (ref 80.0–100.0)
Monocytes Absolute: 0.4 K/uL (ref 0.1–1.0)
Monocytes Relative: 6 %
Neutro Abs: 5.4 K/uL (ref 1.7–7.7)
Neutrophils Relative %: 82 %
Platelets: 170 K/uL (ref 150–400)
RBC: 3.72 MIL/uL — ABNORMAL LOW (ref 3.87–5.11)
RDW: 18.8 % — ABNORMAL HIGH (ref 11.5–15.5)
WBC: 6.5 K/uL (ref 4.0–10.5)
nRBC: 0.6 % — ABNORMAL HIGH (ref 0.0–0.2)

## 2024-07-05 LAB — BASIC METABOLIC PANEL WITH GFR
Anion gap: 14 (ref 5–15)
BUN: 35 mg/dL — ABNORMAL HIGH (ref 8–23)
CO2: 23 mmol/L (ref 22–32)
Calcium: 8.7 mg/dL — ABNORMAL LOW (ref 8.9–10.3)
Chloride: 92 mmol/L — ABNORMAL LOW (ref 98–111)
Creatinine, Ser: 5.06 mg/dL — ABNORMAL HIGH (ref 0.44–1.00)
GFR, Estimated: 8 mL/min — ABNORMAL LOW (ref >60)
Glucose, Bld: 103 mg/dL — ABNORMAL HIGH (ref 70–99)
Potassium: 5.1 mmol/L (ref 3.5–5.1)
Sodium: 129 mmol/L — ABNORMAL LOW (ref 135–145)

## 2024-07-05 LAB — GLUCOSE, CAPILLARY
Glucose-Capillary: 91 mg/dL (ref 70–99)
Glucose-Capillary: 98 mg/dL (ref 70–99)
Glucose-Capillary: 103 mg/dL — ABNORMAL HIGH (ref 70–99)
Glucose-Capillary: 143 mg/dL — ABNORMAL HIGH (ref 70–99)

## 2024-07-05 MED ORDER — HEPARIN SODIUM (PORCINE) 1000 UNIT/ML IJ SOLN
1000.0000 [IU] | Freq: Once | INTRAMUSCULAR | Status: AC
  Administered 2024-07-05: 3200 [IU] via INTRAVENOUS

## 2024-07-05 MED ORDER — HEPARIN SODIUM (PORCINE) 1000 UNIT/ML IJ SOLN
INTRAMUSCULAR | Status: AC
  Filled 2024-07-05: qty 4

## 2024-07-05 NOTE — Procedures (Signed)
 I was present at this dialysis session. I have reviewed the session itself and made appropriate changes.   Vital signs in last 24 hours:  Temp:  [97.3 F (36.3 C)-98.9 F (37.2 C)] 98.7 F (37.1 C) (10/16 0914) Pulse Rate:  [85-99] 87 (10/16 0940) Resp:  [18-26] 19 (10/16 0940) BP: (74-104)/(49-86) 99/64 (10/16 0940) SpO2:  [90 %-100 %] 97 % (10/16 0940) FiO2 (%):  [3 %] 3 % (10/15 1530) Weight:  [87.3 kg-88.3 kg] 87.3 kg (10/16 0914) Weight change: 1.6 kg Filed Weights   07/04/24 0540 07/05/24 0431 07/05/24 0914  Weight: 85.8 kg 88.3 kg 87.3 kg    Recent Labs  Lab 06/30/24 0243 07/01/24 0435 07/05/24 0445  NA 132*   < > 129*  K 4.6   < > 5.1  CL 97*   < > 92*  CO2 22   < > 23  GLUCOSE 80   < > 103*  BUN 25*   < > 35*  CREATININE 3.56*   < > 5.06*  CALCIUM  8.8*   < > 8.7*  PHOS 4.5  --   --    < > = values in this interval not displayed.    Recent Labs  Lab 07/02/24 0458 07/03/24 0518 07/04/24 0540  WBC 7.4 9.2 8.3  HGB 10.6* 10.2* 10.8*  HCT 30.6* 29.0* 31.2*  MCV 78.5* 77.5* 77.2*  PLT 145* 157 164    Scheduled Meds:  amiodarone  200 mg Oral Daily   apixaban   5 mg Oral BID   atorvastatin   80 mg Oral Daily   Chlorhexidine  Gluconate Cloth  6 each Topical Daily   Chlorhexidine  Gluconate Cloth  6 each Topical Q0600   feeding supplement  237 mL Oral TID BM   guaiFENesin   600 mg Oral BID   insulin  aspart  0-5 Units Subcutaneous QHS   insulin  aspart  0-6 Units Subcutaneous TID WC   insulin  glargine-yfgn  10 Units Subcutaneous Daily   levothyroxine   50 mcg Oral Q0600   midodrine  20 mg Oral Q8H   multivitamin  1 tablet Oral QHS   scopolamine   1 patch Transdermal Q72H   sodium chloride  flush  10-40 mL Intracatheter Q12H   sodium chloride  flush  3 mL Intravenous Q12H   sodium chloride  flush  3 mL Intravenous Q12H   Continuous Infusions: PRN Meds:.acetaminophen  **OR** acetaminophen , albuterol , bisacodyl, calcium  carbonate, Gerhardt's butt cream, loperamide,  metoCLOPramide  (REGLAN ) injection, ondansetron  **OR** ondansetron  (ZOFRAN ) IV, mouth rinse, senna-docusate, sodium chloride  flush, sodium chloride  flush, trimethobenzamide   Christina DELENA Sellar,  MD 07/05/2024, 9:43 AM

## 2024-07-05 NOTE — Progress Notes (Signed)
 Pt back on unit from dialysis. Alert and oriented X4 on arrival, no pain expressed, tele reconnected. CHG bath done. When pt was rolled, it was noted that she had some sort of new wound on her back. We thought it was a scratch, but the patient informed us  that it came from laying on a cord for a while. RN took pictures of the wound and measured. Foam placed, and MD notified.

## 2024-07-05 NOTE — Plan of Care (Signed)
  Problem: Education: Goal: Ability to describe self-care measures that may prevent or decrease complications (Diabetes Survival Skills Education) will improve Outcome: Progressing Goal: Individualized Educational Video(s) Outcome: Progressing   Problem: Coping: Goal: Ability to adjust to condition or change in health will improve Outcome: Progressing   Problem: Fluid Volume: Goal: Ability to maintain a balanced intake and output will improve Outcome: Progressing   Problem: Health Behavior/Discharge Planning: Goal: Ability to identify and utilize available resources and services will improve Outcome: Progressing Goal: Ability to manage health-related needs will improve Outcome: Progressing   Problem: Metabolic: Goal: Ability to maintain appropriate glucose levels will improve Outcome: Progressing   Problem: Nutritional: Goal: Maintenance of adequate nutrition will improve Outcome: Progressing Goal: Progress toward achieving an optimal weight will improve Outcome: Progressing   Problem: Skin Integrity: Goal: Risk for impaired skin integrity will decrease Outcome: Progressing   Problem: Tissue Perfusion: Goal: Adequacy of tissue perfusion will improve Outcome: Progressing   Problem: Education: Goal: Ability to demonstrate management of disease process will improve Outcome: Progressing Goal: Ability to verbalize understanding of medication therapies will improve Outcome: Progressing Goal: Individualized Educational Video(s) Outcome: Progressing   Problem: Activity: Goal: Capacity to carry out activities will improve Outcome: Progressing   Problem: Cardiac: Goal: Ability to achieve and maintain adequate cardiopulmonary perfusion will improve Outcome: Progressing   Problem: Education: Goal: Knowledge of General Education information will improve Description: Including pain rating scale, medication(s)/side effects and non-pharmacologic comfort measures Outcome:  Progressing   Problem: Health Behavior/Discharge Planning: Goal: Ability to manage health-related needs will improve Outcome: Progressing   Problem: Clinical Measurements: Goal: Ability to maintain clinical measurements within normal limits will improve Outcome: Progressing Goal: Will remain free from infection Outcome: Progressing Goal: Diagnostic test results will improve Outcome: Progressing Goal: Respiratory complications will improve Outcome: Progressing Goal: Cardiovascular complication will be avoided Outcome: Progressing   Problem: Activity: Goal: Risk for activity intolerance will decrease Outcome: Progressing   Problem: Nutrition: Goal: Adequate nutrition will be maintained Outcome: Progressing   Problem: Coping: Goal: Level of anxiety will decrease Outcome: Progressing   Problem: Elimination: Goal: Will not experience complications related to bowel motility Outcome: Progressing Goal: Will not experience complications related to urinary retention Outcome: Progressing   Problem: Pain Managment: Goal: General experience of comfort will improve and/or be controlled Outcome: Progressing   Problem: Safety: Goal: Ability to remain free from injury will improve Outcome: Progressing   Problem: Skin Integrity: Goal: Risk for impaired skin integrity will decrease Outcome: Progressing   Problem: Education: Goal: Understanding of CV disease, CV risk reduction, and recovery process will improve Outcome: Progressing Goal: Individualized Educational Video(s) Outcome: Progressing   Problem: Activity: Goal: Ability to return to baseline activity level will improve Outcome: Progressing   Problem: Cardiovascular: Goal: Ability to achieve and maintain adequate cardiovascular perfusion will improve Outcome: Progressing Goal: Vascular access site(s) Level 0-1 will be maintained Outcome: Progressing   Problem: Health Behavior/Discharge Planning: Goal: Ability to  safely manage health-related needs after discharge will improve Outcome: Progressing

## 2024-07-05 NOTE — TOC Progression Note (Addendum)
 Transition of Care [facility]) - Progression Note    Patient Details  Name: [name] MRN: [id number] Date of Birth: 10-Aug-1951  Transition of Care [facility] CM/SW Contact  [name] Phone Number: [phone] 07/05/2024, 9:39 AM  Clinical Narrative:  9:39 AM- HF CSW called the patients son [name] and left a VM asked for him to give a call back.   12:36 PM- HF CSW received a call back from the patients son. Patients son stated that he has not had the opportunity to review SNF list and will make a choice by the EOD. Patients son stated that he would prefer [facility]. CSW will f/u to see if they have any beds.   HF CSW/CM will continue to follow and monitor for dc readiness.      Expected Discharge Plan: Skilled Nursing Facility Barriers to Discharge: Continued Medical Work up               Expected Discharge Plan and Services   Discharge Planning Services: CM Consult Post Acute Care Choice: Skilled Nursing Facility Living arrangements for the past 2 months: Single Family Home                                       Social Drivers of Health (SDOH) Interventions SDOH Screenings   Food Insecurity: No Food Insecurity (06/18/2024)  Recent Concern: Food Insecurity - Food Insecurity Present (04/23/2024)  Housing: Low Risk  (06/18/2024)  Transportation Needs: No Transportation Needs (06/18/2024)  Utilities: Not At Risk (06/18/2024)  Alcohol Screen: Low Risk  (03/21/2024)  Depression (PHQ2-9): Low Risk  (04/23/2024)  Financial Resource Strain: Low Risk  (03/21/2024)  Physical Activity: Sufficiently Active (03/21/2024)  Social Connections: Moderately Integrated (06/18/2024)  Stress: No Stress Concern Present (03/21/2024)  Tobacco Use: Medium Risk (06/17/2024)  Health Literacy: Adequate Health Literacy (03/21/2024)    Readmission Risk Interventions     No data to display

## 2024-07-05 NOTE — Progress Notes (Signed)
   07/05/24 1309  Vitals  Temp 98.3 F (36.8 C)  Pulse Rate 79  Resp (!) 23  BP (!) 108/48  SpO2 98 %  O2 Device Nasal Cannula  Weight 86.5 kg  Type of Weight Post-Dialysis  Oxygen Therapy  Patient Activity (if Appropriate) In bed  Pulse Oximetry Type Continuous  Oximetry Probe Site Changed No  Post Treatment  Dialyzer Clearance Lightly streaked  Hemodialysis Intake (mL) 0 mL  Liters Processed 84  Fluid Removed (mL) 1000 mL  Tolerated HD Treatment Yes   Received patient in bed to unit.  Alert and oriented.  Informed consent signed and in chart.   TX duration:3.5  Patient tolerated well.  Transported back to the room  Alert, without acute distress.  Hand-off given to patient's nurse.   Access used: Yes Access issues: No  Total UF removed: 1000 Medication(s) given: See MAR Post HD VS: See Above Grid  Post HD weight: 86.5 kg   Zebedee DELENA [facility]

## 2024-07-05 NOTE — Progress Notes (Signed)
   Inpatient Rehabilitation Admissions Coordinator   We will sign off. SNF appropriate.  Heron Leavell, RN, MSN Rehab Admissions Coordinator [phone] 07/05/2024 1:02 PM

## 2024-07-05 NOTE — Progress Notes (Addendum)
 Progress Note   Patient: [name] FMW:[id number] DOB: 05-30-1951 DOA: 06/17/2024     18 DOS: the patient was seen and examined on 07/05/2024   Brief hospital course: 73 y.o. with history of combined systolic and diastolic heart failure s/p BiV ICD, CAD, OSA on bipap, LBBB, HTN, hypotension, and atrial fibrillation.  - Admitted with volume overload, AKI - Echo 9/30 noted EF 20-25%, severe MR, moderately reduced RV - RHC 9/30> elevated PA pressures and filling pressures, PA PI of 1.6 - Treated with inotropes and vasopressors in ICU, now off - Complicated by worsening renal failure, cardiorenal syndrome, failed high-dose diuretic challenge, then started on CRRT from 9/30-10/10 -sp cardioversion for A-fib -Palliative care consulted and following - 10/11 initiated hemodialysis - Transferred out of ICU to [facility] service 10/14     Assessment and Plan:   Acute on chronic biventricular failure Cardiogenic shock -Admitted with cardiogenic shock, worsening renal failure and A-fib - Echo with EF 20-25, moderately reduced RV, severe mitral regurgitation -RHC 9/30 noted elevated PA pressures wedge of 40, PA PI 1.6 - Treated with vasopressors, inotropes and high-dose diuretics - Underwent TEE/DCCV - With worsening renal failure started CRRT and now intermittent hemodialysis since 10/11 - Continue high-dose midodrine - Palliative care following -Toc following for SNF   Severe mitral regurgitation -Noted on echo this admission, not felt to be a mitral clip candidate   AKI on CKD - Cardiorenal, failed high-dose diuretics - Treated with CRRT 9/30-10/10 - Started on intermittent hemodialysis 10/11 - Nephrology following   Hyponatremia-persistent Likely secondary to volume overload Monitor sodium closely   Paroxysmal atrial fibrillation - New onset in August - Cardioverted 10/5 - Remains in sinus rhythm  Continue amiodarone and Eliquis    CAD -Stable at this time, no ACS on  admission, continue atorvastatin    Acute hypoxic respiratory failure - Secondary to above, improving   Type 2 diabetes mellitus - CBGs are uncontrolled Continue insulin  therapy   Microcytic anemia - Stable, monitor     DVT prophylaxis: Apixaban   Code Status: Full code  Family Communication: No family at bedside today  Disposition Plan: Plan for SNF in few days     Subjective:  Patient seen and examined at bedside this morning She admits to improvement in her respiratory function Denies chest pain abdominal pain She was seen during hemodialysis this morning  Physical Exam: Vitals:   07/05/24 1200 07/05/24 1230 07/05/24 1308 07/05/24 1309  BP: (!) 100/59 (!) 99/56 (!) 102/58 (!) 108/48  Pulse: (!) 57 85 (!) 55 79  Resp: 17 (!) 23 (!) 23 (!) 23  Temp:    98.3 F (36.8 C)  TempSrc:      SpO2: 96% 96% 97% 98%  Weight:    86.5 kg  Height:       Chronically ill, AAO x 2, no distress HEENT: IJ dialysis catheter CVS: S1-S2, regular rhythm Lungs: Decreased breath sounds at the bases Abdomen: Soft, nontender bowel sounds present Remedies: Trace edema   Data Reviewed:    Latest Ref Rng & Units 07/05/2024    9:28 AM 07/04/2024    5:40 AM 07/03/2024    5:18 AM  CBC  WBC 4.0 - 10.5 K/uL 6.5  8.3  9.2   Hemoglobin 12.0 - 15.0 g/dL 9.8  89.1  89.7   Hematocrit 36.0 - 46.0 % 28.8  31.2  29.0   Platelets 150 - 400 K/uL 170  164  157        Latest  Ref Rng & Units 07/05/2024    4:45 AM 07/04/2024    5:40 AM 07/03/2024    5:18 AM  BMP  Glucose 70 - 99 mg/dL 896  834  742   BUN 8 - 23 mg/dL 35  27  41   Creatinine 0.44 - 1.00 mg/dL 4.93  5.96  4.57   Sodium 135 - 145 mmol/L 129  128  127   Potassium 3.5 - 5.1 mmol/L 5.1  4.4  5.0   Chloride 98 - 111 mmol/L 92  93  92   CO2 22 - 32 mmol/L 23  24  22    Calcium  8.9 - 10.3 mg/dL 8.7  8.4  8.7      Author: Drue ONEIDA Potter, MD 07/05/2024 2:02 PM  For on call review [email or web address].

## 2024-07-06 DIAGNOSIS — I480 Paroxysmal atrial fibrillation: Secondary | ICD-10-CM

## 2024-07-06 DIAGNOSIS — D649 Anemia, unspecified: Secondary | ICD-10-CM

## 2024-07-06 DIAGNOSIS — I10 Essential (primary) hypertension: Secondary | ICD-10-CM | POA: Diagnosis not present

## 2024-07-06 DIAGNOSIS — N1832 Chronic kidney disease, stage 3b: Secondary | ICD-10-CM | POA: Diagnosis not present

## 2024-07-06 DIAGNOSIS — E785 Hyperlipidemia, unspecified: Secondary | ICD-10-CM

## 2024-07-06 DIAGNOSIS — I2510 Atherosclerotic heart disease of native coronary artery without angina pectoris: Secondary | ICD-10-CM | POA: Diagnosis not present

## 2024-07-06 DIAGNOSIS — L8990 Pressure ulcer of unspecified site, unspecified stage: Secondary | ICD-10-CM

## 2024-07-06 DIAGNOSIS — E1169 Type 2 diabetes mellitus with other specified complication: Secondary | ICD-10-CM

## 2024-07-06 DIAGNOSIS — I5023 Acute on chronic systolic (congestive) heart failure: Secondary | ICD-10-CM | POA: Diagnosis not present

## 2024-07-06 LAB — BASIC METABOLIC PANEL WITH GFR
Anion gap: 13 (ref 5–15)
BUN: 24 mg/dL — ABNORMAL HIGH (ref 8–23)
CO2: 24 mmol/L (ref 22–32)
Calcium: 8.4 mg/dL — ABNORMAL LOW (ref 8.9–10.3)
Chloride: 91 mmol/L — ABNORMAL LOW (ref 98–111)
Creatinine, Ser: 3.74 mg/dL — ABNORMAL HIGH (ref 0.44–1.00)
GFR, Estimated: 12 mL/min — ABNORMAL LOW (ref >60)
Glucose, Bld: 138 mg/dL — ABNORMAL HIGH (ref 70–99)
Potassium: 4.1 mmol/L (ref 3.5–5.1)
Sodium: 128 mmol/L — ABNORMAL LOW (ref 135–145)

## 2024-07-06 LAB — GLUCOSE, CAPILLARY
Glucose-Capillary: 117 mg/dL — ABNORMAL HIGH (ref 70–99)
Glucose-Capillary: 172 mg/dL — ABNORMAL HIGH (ref 70–99)
Glucose-Capillary: 133 mg/dL — ABNORMAL HIGH (ref 70–99)
Glucose-Capillary: 161 mg/dL — ABNORMAL HIGH (ref 70–99)

## 2024-07-06 LAB — CBC
HCT: 28.9 % — ABNORMAL LOW (ref 36.0–46.0)
Hemoglobin: 10.2 g/dL — ABNORMAL LOW (ref 12.0–15.0)
MCH: 27.1 pg (ref 26.0–34.0)
MCHC: 35.3 g/dL (ref 30.0–36.0)
MCV: 76.9 fL — ABNORMAL LOW (ref 80.0–100.0)
Platelets: 165 K/uL (ref 150–400)
RBC: 3.76 MIL/uL — ABNORMAL LOW (ref 3.87–5.11)
RDW: 18.6 % — ABNORMAL HIGH (ref 11.5–15.5)
WBC: 7 K/uL (ref 4.0–10.5)
nRBC: 0.4 % — ABNORMAL HIGH (ref 0.0–0.2)

## 2024-07-06 MED ORDER — GUAIFENESIN-DM 100-10 MG/5ML PO SYRP
5.0000 mL | ORAL_SOLUTION | ORAL | Status: DC | PRN
  Administered 2024-07-06 – 2024-07-15 (×3): 5 mL via ORAL
  Filled 2024-07-06 (×3): qty 5

## 2024-07-06 NOTE — Assessment & Plan Note (Signed)
 Cell count has been stable.

## 2024-07-06 NOTE — Assessment & Plan Note (Signed)
 Continue insulin  sliding scale for glucose cover and monitoring Basal insulin   Fasting glucose today 170 mg/dl

## 2024-07-06 NOTE — Assessment & Plan Note (Signed)
 Continue blood pressure support with midodrine.

## 2024-07-06 NOTE — Progress Notes (Signed)
 Progress Note   Patient: [name] FMW:[id number] DOB: 04/22/1951 DOA: 06/17/2024     19 DOS: the patient was seen and examined on 07/06/2024   Brief hospital course: 73 y.o. with history of combined systolic and diastolic heart failure s/p BiV ICD, CAD, OSA on bipap, LBBB, HTN, hypotension, and atrial fibrillation.  - Admitted with volume overload, AKI - Echo 9/30 noted EF 20-25%, severe MR, moderately reduced RV - RHC 9/30> elevated PA pressures and filling pressures, PA PI of 1.6 - Treated with inotropes and vasopressors in ICU, now off - Complicated by worsening renal failure, cardiorenal syndrome, failed high-dose diuretic challenge, then started on CRRT from 9/30-10/10 -sp cardioversion for A-fib -Palliative care consulted and following - 10/11 initiated hemodialysis - Transferred out of ICU to [facility] service 10/14  Assessment and Plan: * Acute on chronic systolic CHF (congestive heart failure) (HCC) Echocardiogram with reduced LV systolic function, EF 20 to 25%, global hypokinesis, moderate dilatation of LV cavity, moderate LVH, interventricular septum flattened in systole, RV systolic function with moderate reduction, RVSP 38.0 mmHg, LA with moderate dilatation, severe mitral and tricuspid regurgitation,   Biventricular failure  Acute on chronic cor pulmonale  09/30 cardiac catheterization  RA 17  RV 53/17 PA 63/35 mean 46  PCWP 40  Cardiac output 5.7 index 2.8 (Fick) Cardiac output 3,7 index 1.8 (thermodilution) PVR 1.6 Wood units  Patient was placed on inotropic support for low output state/ cardiogenic shock, IV diuresis and vasopressors.   Required CRRT for worsening renal failure, now on intermittent HD. Limited medical therapy due to hypotension Currently on midodrine for blood pressure support.    Acute hypoxemic respiratory failure due to acute cardiogenic pulmonary edema and volume overload Clinically improving with ultrafiltration   Chronic kidney  disease, stage 3b (HCC) AKI oliguric, ATN Hyponatremia   Patient was placed on CRRT on 09/30 for volume overload, not responding to diuretic therapy.  10/11 transitioned to intermittent HD Blood pressure support with midodrine.  10/15 right internal jugular vein temporal dialysis catheter (tunneled) per IR   Today serum NA 128 with K at 4.1 and serum bicarbonate at 24 BUN is 24   Coronary artery disease No chest pain, no acute coronary syndrome   Paroxysmal atrial fibrillation (HCC) 10/05 Sp TEE cardioversion. Plan to continue with amiodarone for rate and rhythm control Anticoagulation with apixaban     Essential hypertension Continue blood pressure support with midodrine   Type 2 diabetes mellitus with hyperlipidemia (HCC) Continue insulin  sliding scale for glucose cover and monitoring Basal insulin    Hypothyroidism Continue levothyroxine    Malnutrition of moderate degree Continue with nutritional supplements   OSA (obstructive sleep apnea) Cpap Obesity class 1 with calculated BMI 31.5   Pressure injury of skin Continue local wound care   Chronic anemia Cell count has been stable        Subjective: Patient continue very weak and deconditioned ,today with no chest pain or dyspnea   Physical Exam: Vitals:   07/06/24 0122 07/06/24 0300 07/06/24 0800 07/06/24 1028  BP:  102/72  119/68  Pulse:  96  96  Resp:  20  20  Temp:  97.7 F (36.5 C)    TempSrc:  Oral Oral   SpO2:  99%  98%  Weight: 87.1 kg   86.1 kg  Height:    5' 5 (1.651 m)   Neurology awake and alert, deconditioned  ENT with mild pallor  Cardiovascular with S1 and S2 present and regular with no gallops  or rubs, positive systolic murmur at the apex No JVD Respiratory with positive rales with no wheezing Abdomen with no distention  Trace lower extremity edema  Data Reviewed:    Family Communication: I spoke with patient's son at the bedside, we talked in detail about patient's  condition, plan of care and prognosis and all questions were addressed.   Disposition: Status is: Inpatient Remains inpatient appropriate because: renal replacement   Planned Discharge Destination: Skilled nursing facility     Author: Elidia Toribio Furnace, MD 07/06/2024 1:09 PM  For on call review [email or web address].

## 2024-07-06 NOTE — Assessment & Plan Note (Signed)
 10/05 Sp TEE cardioversion. Plan to continue with amiodarone for rate and rhythm control Anticoagulation with apixaban 

## 2024-07-06 NOTE — Assessment & Plan Note (Signed)
 Cpap Obesity class 1 with calculated BMI 31.5

## 2024-07-06 NOTE — Assessment & Plan Note (Addendum)
 Echocardiogram with reduced LV systolic function, EF 20 to 25%, global hypokinesis, moderate dilatation of LV cavity, moderate LVH, interventricular septum flattened in systole, RV systolic function with moderate reduction, RVSP 38.0 mmHg, LA with moderate dilatation, severe mitral and tricuspid regurgitation,   Biventricular failure  Acute on chronic cor pulmonale  09/30 cardiac catheterization  RA 17  RV 53/17 PA 63/35 mean 46  PCWP 40  Cardiac output 5.7 index 2.8 (Fick) Cardiac output 3,7 index 1.8 (thermodilution) PVR 1.6 Wood units  Patient was placed on inotropic support for low output state/ cardiogenic shock, IV diuresis and vasopressors.   Required CRRT for worsening renal failure, now on intermittent HD. Limited medical therapy due to hypotension Currently on midodrine for blood pressure support.    Acute hypoxemic respiratory failure due to acute cardiogenic pulmonary edema and volume overload Clinically improving with ultrafiltration  Today 02 saturation 96% on 2 l/min

## 2024-07-06 NOTE — Assessment & Plan Note (Signed)
 Continue local wound care.

## 2024-07-06 NOTE — Assessment & Plan Note (Signed)
 AKI oliguric, ATN, likely progress to end stage renal disease,  Hyponatremia   Patient was placed on CRRT on 09/30 for volume overload, not responding to diuretic therapy.  10/11 transitioned to intermittent HD Blood pressure support with midodrine.  10/15 right internal jugular vein temporal dialysis catheter (tunneled) per IR   Pre HD Na 126, BUN 41 and K 4.6 with serum bicarbonate at 25  P 5.3  HD today   Continue renal replacement therapy per Nephrology recommendations.

## 2024-07-06 NOTE — TOC Progression Note (Addendum)
 Transition of Care [facility]) - Progression Note    Patient Details  Name: [name] MRN: [id number] Date of Birth: 1951-02-23  Transition of Care [facility]) CM/SW Contact  [name], [place] Phone Number: 07/06/2024, 12:21 PM  Clinical Narrative:   CSW left VM for [place], CM at [facility], to check on facilities bed availability. Per [place], beds are limited. CSW asked if facility can re-review patients referral.    12:44 PM CSW reached out to patients son via his preferred communication method (text) as he is a Runner, broadcasting/film/video and unable to answer the phone throughout the day. CSW inquired about his SNF choice in the event [facility] is able to accept. CSW awaiting a response.   3:16 PM Patients son chose [facility] at this time. Per Mr. [name], he would like [facility] if a bed becomes a available but is okay with patient going to [facility]. CSW to submit for auth once updated PT note is in.  CSW will continue to follow.    Expected Discharge Plan: Skilled Nursing Facility Barriers to Discharge: Continued Medical Work up               Expected Discharge Plan and Services   Discharge Planning Services: CM Consult Post Acute Care Choice: Skilled Nursing Facility Living arrangements for the past 2 months: Single Family Home                                       Social Drivers of Health (SDOH) Interventions SDOH Screenings   Food Insecurity: No Food Insecurity (06/18/2024)  Recent Concern: Food Insecurity - Food Insecurity Present (04/23/2024)  Housing: Low Risk  (06/18/2024)  Transportation Needs: No Transportation Needs (06/18/2024)  Utilities: Not At Risk (06/18/2024)  Alcohol Screen: Low Risk  (03/21/2024)  Depression (PHQ2-9): Low Risk  (04/23/2024)  Financial Resource Strain: Low Risk  (03/21/2024)  Physical Activity: Sufficiently Active (03/21/2024)  Social Connections: Moderately Integrated (06/18/2024)  Stress: No Stress Concern Present (03/21/2024)   Tobacco Use: Medium Risk (06/17/2024)  Health Literacy: Adequate Health Literacy (03/21/2024)    Readmission Risk Interventions     No data to display

## 2024-07-06 NOTE — Assessment & Plan Note (Signed)
 Continue with nutritional supplements.

## 2024-07-06 NOTE — Progress Notes (Signed)
 Patient ID: [name], female   DOB: December 31, 1950, 73 y.o.   MRN: [id number] S: No new complaints O:BP 119/68 (BP Location: Left Arm)   Pulse 96   Temp 97.7 F (36.5 C) (Oral)   Resp 20   Ht 5' 5 (1.651 m)   Wt 86.1 kg   SpO2 98%   BMI 31.59 kg/m   Intake/Output Summary (Last 24 hours) at 07/06/2024 1204 Last data filed at 07/06/2024 0854 Gross per 24 hour  Intake 837 ml  Output 1000 ml  Net -163 ml   Intake/Output: I/O last 3 completed shifts: In: 1030 [P.O.:1017; I.V.:13] Out: 1000 [Other:1000]  Intake/Output this shift:  Total I/O In: 120 [P.O.:120] Out: -  Weight change: -1 kg Gen: NAD CVS: RRR Resp:CTA Abd: +BS, soft, NT/ND Ext: no edema  Recent Labs  Lab 06/30/24 0243 07/01/24 0435 07/02/24 0458 07/03/24 0518 07/04/24 0540 07/05/24 0445 07/06/24 0543  NA 132* 132* 128* 127* 128* 129* 128*  K 4.6 4.3 4.7 5.0 4.4 5.1 4.1  CL 97* 96* 94* 92* 93* 92* 91*  CO2 22 25 24 22 24 23 24   GLUCOSE 80 88 219* 257* 165* 103* 138*  BUN 25* 16 28* 41* 27* 35* 24*  CREATININE 3.56* 2.90* 4.21* 5.42* 4.03* 5.06* 3.74*  ALBUMIN 2.6*  --   --   --   --   --   --   CALCIUM  8.8* 8.5* 8.6* 8.7* 8.4* 8.7* 8.4*  PHOS 4.5  --   --   --   --   --   --    Liver Function Tests: Recent Labs  Lab 06/30/24 0243  ALBUMIN 2.6*   No results for input(s): LIPASE, AMYLASE in the last 168 hours. No results for input(s): AMMONIA in the last 168 hours. CBC: Recent Labs  Lab 07/02/24 0458 07/03/24 0518 07/04/24 0540 07/05/24 0928 07/06/24 0543  WBC 7.4 9.2 8.3 6.5 7.0  NEUTROABS  --   --   --  5.4  --   HGB 10.6* 10.2* 10.8* 9.8* 10.2*  HCT 30.6* 29.0* 31.2* 28.8* 28.9*  MCV 78.5* 77.5* 77.2* 77.4* 76.9*  PLT 145* 157 164 170 165   Cardiac Enzymes: No results for input(s): CKTOTAL, CKMB, CKMBINDEX, TROPONINI in the last 168 hours. CBG: Recent Labs  Lab 07/05/24 0644 07/05/24 1509 07/05/24 2122 07/06/24 0613 07/06/24 1149  GLUCAP 98 103* 143* 117*  172*    Iron Studies: No results for input(s): IRON, TIBC, TRANSFERRIN, FERRITIN in the last 72 hours. Studies/Results: IR TUNNELED CENTRAL VENOUS CATH [facility] W IMG Result Date: 07/04/2024 INDICATION: ESRD requiring HD. EXAM: TUNNELED CENTRAL VENOUS HEMODIALYSIS CATHETER PLACEMENT WITH ULTRASOUND AND FLUOROSCOPIC GUIDANCE MEDICATIONS: Ancef  2 gm IV. The antibiotic was given in an appropriate time interval prior to skin puncture. ANESTHESIA/SEDATION: Moderate (conscious) sedation was employed during this procedure. A total of Versed  0.5 mg and Fentanyl  20 mcg was administered intravenously. Moderate Sedation Time: 15 minutes. The patient's level of consciousness and vital signs were monitored continuously by radiology nursing throughout the procedure under my direct supervision. FLUOROSCOPY: Radiation Exposure Index and estimated peak skin dose (PSD); Reference air kerma (RAK), 0.3 mGy. COMPLICATIONS: None immediate. PROCEDURE: Informed written consent was obtained from the patient after a discussion of the risks, benefits, and alternatives to treatment. Questions regarding the procedure were encouraged and answered. The RIGHT neck and chest were prepped with chlorhexidine  in a sterile fashion, and a sterile drape was applied covering the operative field. Maximum barrier sterile technique  with sterile gowns and gloves were used for the procedure. A timeout was performed prior to the initiation of the procedure. After creating a small venotomy incision, a micropuncture kit was utilized to access the internal jugular vein. Real-time ultrasound guidance was utilized for vascular access including the acquisition of a permanent ultrasound image documenting patency of the accessed vessel. The microwire was utilized to measure appropriate catheter length. A stiff Glidewire was advanced to the level of the IVC and the micropuncture sheath was exchanged for a peel-away sheath. A palindrome tunneled hemodialysis  catheter measuring 19 cm from tip to cuff was tunneled in a retrograde fashion from the anterior chest wall to the venotomy incision. The catheter was then placed through the peel-away sheath with tips ultimately positioned within the superior aspect of the right atrium. Final catheter positioning was confirmed and documented with a spot radiographic image. The catheter aspirates and flushes normally. The catheter was flushed with appropriate volume heparin  dwells. The catheter exit site was secured with a 0-Prolene retention suture. The venotomy incision was closed with Dermabond. Dressings were applied. The patient tolerated the procedure well without immediate post procedural complication. IMPRESSION: Successful placement of 19 cm tip to cuff tunneled hemodialysis catheter via the RIGHT internal jugular vein. The tip of the catheter is positioned within the proximal RIGHT atrium. The catheter is ready for immediate use. Thom Hall, MD [facility] Radiology Electronically Signed   By: Thom Hall M.D.   On: 07/04/2024 16:52    amiodarone  200 mg Oral Daily   apixaban   5 mg Oral BID   atorvastatin   80 mg Oral Daily   Chlorhexidine  Gluconate Cloth  6 each Topical Daily   Chlorhexidine  Gluconate Cloth  6 each Topical Q0600   feeding supplement  237 mL Oral TID BM   guaiFENesin   600 mg Oral BID   insulin  aspart  0-5 Units Subcutaneous QHS   insulin  aspart  0-6 Units Subcutaneous TID WC   insulin  glargine-yfgn  10 Units Subcutaneous Daily   levothyroxine   50 mcg Oral Q0600   midodrine  20 mg Oral Q8H   multivitamin  1 tablet Oral QHS   scopolamine   1 patch Transdermal Q72H   sodium chloride  flush  10-40 mL Intracatheter Q12H   sodium chloride  flush  3 mL Intravenous Q12H   sodium chloride  flush  3 mL Intravenous Q12H    BMET    Component Value Date/Time   NA 128 (L) 07/06/2024 0543   NA 138 06/12/2024 1638   K 4.1 07/06/2024 0543   CL 91 (L)  07/06/2024 0543   CO2 24 07/06/2024 0543   GLUCOSE 138 (H) 07/06/2024 0543   BUN 24 (H) 07/06/2024 0543   BUN 36 (H) 06/12/2024 1638   CREATININE 3.74 (H) 07/06/2024 0543   CREATININE 1.32 (H) 03/14/2020 1524   CALCIUM  8.4 (L) 07/06/2024 0543   GFRNONAA 12 (L) 07/06/2024 0543   GFRNONAA 40 (L) 03/14/2020 1524   GFRAA 47 (L) 03/14/2020 1524   CBC    Component Value Date/Time   WBC 7.0 07/06/2024 0543   RBC 3.76 (L) 07/06/2024 0543   HGB 10.2 (L) 07/06/2024 0543   HGB 12.0 06/12/2024 1638   HCT 28.9 (L) 07/06/2024 0543   HCT 37.1 06/12/2024 1638   PLT 165 07/06/2024 0543   PLT 214 06/12/2024 1638   MCV 76.9 (L) 07/06/2024 0543   MCV 89 06/12/2024 1638   MCH 27.1 07/06/2024 0543   MCHC  35.3 07/06/2024 0543   RDW 18.6 (H) 07/06/2024 0543   RDW 20.0 (H) 06/12/2024 1638   LYMPHSABS 0.6 (L) 07/05/2024 0928   LYMPHSABS 1.0 06/13/2023 1356   MONOABS 0.4 07/05/2024 0928   EOSABS 0.0 07/05/2024 0928   EOSABS 0.3 06/13/2023 1356   BASOSABS 0.0 07/05/2024 0928   BASOSABS 0.0 06/13/2023 1356    Assessment/Recommendations: [name] is a/an 73 y.o. female with a past medical history significant for systolic & diastolic heart failure s/p BiV ICD, CAD, OSA on BiPAP, LBBB, HTN, new Afib who presents to with Chest pain, SOB, cough.      Anuric AKI (not improving): Likely secondary to new RV heart failure, persistent atrial fibrillation, Cardiorenal Syndrome  -failed high dose diuretic challenge and CRRT initiated mainly for volume  Continued CRRT: 9/30-10/10 -did trial iHD 10/11 in setting of optimized hemodynamics; ok to use mid treatment midodrine for BP support but no plans for albumin or pressor support given not available outpt. UF goal 1L.  She did tolerate IHD despite low bp's. -plan next HD 10/18 pending course -Continue to monitor daily Cr, Dose meds for GFR<15 -Monitor Daily I/Os, Daily weight  -Maintain MAP>65 for optimal renal perfusion.  -Agree with holding RAASi,  avoid further nephrotoxins including NSAIDS, Morphine .  Unless absolutely necessary, avoid CT with contrast and/or MRI with gadolinium.    - Appreciate IR assistance with RIJ TDC placed on 07/04/24.    Hyponatremia: Mild, stable.     AoC BiV HF; New onset RV failure: pressors and inotropes weaned, on midodrine 20 TID now.  D/w cardiology - think additional cardiac improvement is unlikely.  Did tolerate HD yesterday with UF 1.5L which per I/Os would be appropriate to maintain euvolemia.  BP lower this AM and she was too fatigued yesterday to sit in chair.  At this point continue current care but in the coming days will need to see if she's overall strong enough to continue with dialysis and current therapies.     Atrial fibrillation: S/p cardioversion 10/5.  Remains in sinus rhythm.  On po amiodarone now.   CAD: On atorvastatin    Cont GOC discussion led by palliative and heart failure.  Plan continue dialysis as tolerated for now.    Christina RONAL Sellar, MD [facility]

## 2024-07-06 NOTE — Assessment & Plan Note (Signed)
No chest pain, no acute coronary syndrome.  

## 2024-07-06 NOTE — Progress Notes (Signed)
 Physical Therapy Treatment Patient Details Name: [name] MRN: [id number] DOB: Aug 11, 1951 Today's Date: 07/06/2024   History of Present Illness Pt is a 73 y/o F presenting to ED on 9/29 with chest discomfort and SOB. CT chest with R > L pleural effusions, Admitted for acute on chronic CHF and AKI, started on CRRT 9/30. S/p successful DCCV on 10/5. Off CRRT 10/9. PMH includes CAD, CHF s/p defibrillator, A fib on eliquis , pacemaker, anemia, arthritis, HTN, hypothyroidism, DM, cardiomyopathy.    PT Comments  Pt was very determined to give it her best, very participative.  Slowly progressing toward goals.  Emphasis on transitions, STS x3 and pregait activity standing in the RW.     If plan is discharge home, recommend the following: A lot of help with walking and/or transfers;A lot of help with bathing/dressing/bathroom;Assistance with cooking/housework;Assist for transportation   Can travel by private vehicle     No  Equipment Recommendations  Rolling walker (2 wheels);BSC/3in1;Wheelchair (measurements PT);Wheelchair cushion (measurements PT) (TBD at next venue)    Recommendations for Other Services       Precautions / Restrictions Precautions Precautions: Fall     Mobility  Bed Mobility Overal bed mobility: Needs Assistance Bed Mobility: Rolling, Sidelying to Sit Rolling: Min assist, Mod assist Sidelying to sit: Mod assist       General bed mobility comments: pt very participative, but more deconditioned needing light mod up via L elbow    Transfers Overall transfer level: Needs assistance   Transfers: Sit to/from Stand, Bed to chair/wheelchair/BSC Sit to Stand: Mod assist, +2 physical assistance (or light max from L side.)   Step pivot transfers: Mod assist (side stepping/pivot)       General transfer comment: x3 with work on technique with bil hands on bed, assistance both forward and with boost.    Ambulation/Gait               General Gait Details:  pre gait, w/shift and low amplitude marching in place over 2/3 standing trials.   Stairs             Wheelchair Mobility     Tilt Bed    Modified Rankin (Stroke Patients Only)       Balance   Sitting-balance support: No upper extremity supported, Feet supported Sitting balance-Leahy Scale: Good     Standing balance support: Bilateral upper extremity supported Standing balance-Leahy Scale: Poor                              Communication Communication Communication: No apparent difficulties  Cognition Arousal: Alert Behavior During Therapy: WFL for tasks assessed/performed, Flat affect   PT - Cognitive impairments: No apparent impairments                           Following commands impaired: Follows one step commands with increased time    Cueing Cueing Techniques: Verbal cues  Exercises Other Exercises Other Exercises: hip/knees flex/ext bil  x10 reps with graded assist then resistance.    General Comments General comments (skin integrity, edema, etc.): VSS overall.  SpO2 better than 91% on O2 and HR 80's and 90's      Pertinent Vitals/Pain Pain Assessment Pain Assessment: Faces Faces Pain Scale: No hurt Pain Intervention(s): Monitored during session    Home Living  Prior Function            PT Goals (current goals can now be found in the care plan section) Acute Rehab PT Goals PT Goal Formulation: With patient Time For Goal Achievement: 07/09/24 Potential to Achieve Goals: Fair Progress towards PT goals: Progressing toward goals;Goals updated    Frequency    Min 2X/week      PT Plan      Co-evaluation              AM-PAC PT 6 Clicks Mobility   Outcome Measure  Help needed turning from your back to your side while in a flat bed without using bedrails?: A Little Help needed moving from lying on your back to sitting on the side of a flat bed without using bedrails?: A  Lot Help needed moving to and from a bed to a chair (including a wheelchair)?: A Lot Help needed standing up from a chair using your arms (e.g., wheelchair or bedside chair)?: A Lot Help needed to walk in hospital room?: Total Help needed climbing 3-5 steps with a railing? : Total 6 Click Score: 11    End of Session Equipment Utilized During Treatment: Oxygen Activity Tolerance: Patient tolerated treatment well;Patient limited by fatigue Patient left: with call bell/phone within reach;in bed Nurse Communication: Mobility status PT Visit Diagnosis: Unsteadiness on feet (R26.81);Other abnormalities of gait and mobility (R26.89)     Time: 8554-8483 PT Time Calculation (min) (ACUTE ONLY): 31 min  Charges:    $Therapeutic Activity: 23-37 mins PT General Charges $$ ACUTE PT VISIT: 1 Visit                     07/06/2024  [place] HERO., PT Acute Rehabilitation Services [phone]  (office)   Vinie GAILS Walsie Smeltz 07/06/2024, 3:29 PM

## 2024-07-06 NOTE — TOC Progression Note (Signed)
 Transition of Care [facility]) - Progression Note    Patient Details  Name: [name] MRN: [id number] Date of Birth: Feb 23, 1951  Transition of Care [facility]) CM/SW Contact  [name] Phone Number: [phone] 07/06/2024, 4:16 PM  Clinical Narrative:  CSW spoke with the Garrel at [facility] who stated that they accept weekend dc. Garrel inquired if the patient would be dc with HD services. At this time it is unclear if the patient will dc with opt HD services.   CSW spoke with the patients son [name] over the phone who gave permission for the auth to be started for [facility]. And inquired about [facility]. At this time [facility] declined.   CSW inquired with kidney Team Abigail and Randine who stated that they have not been asked to [facility] for out-pt HD yet. following along as needed.   Unit CSW will f/u.    Expected Discharge Plan: Skilled Nursing Facility Barriers to Discharge: Continued Medical Work up               Expected Discharge Plan and Services   Discharge Planning Services: CM Consult Post Acute Care Choice: Skilled Nursing Facility Living arrangements for the past 2 months: Single Family Home                                       Social Drivers of Health (SDOH) Interventions SDOH Screenings   Food Insecurity: No Food Insecurity (06/18/2024)  Recent Concern: Food Insecurity - Food Insecurity Present (04/23/2024)  Housing: Low Risk  (06/18/2024)  Transportation Needs: No Transportation Needs (06/18/2024)  Utilities: Not At Risk (06/18/2024)  Alcohol Screen: Low Risk  (03/21/2024)  Depression (PHQ2-9): Low Risk  (04/23/2024)  Financial Resource Strain: Low Risk  (03/21/2024)  Physical Activity: Sufficiently Active (03/21/2024)  Social Connections: Moderately Integrated (06/18/2024)  Stress: No Stress Concern Present (03/21/2024)  Tobacco Use: Medium Risk (06/17/2024)  Health Literacy: Adequate Health Literacy (03/21/2024)    Readmission Risk  Interventions     No data to display

## 2024-07-06 NOTE — Assessment & Plan Note (Signed)
 Continue levothyroxine 

## 2024-07-07 DIAGNOSIS — I5023 Acute on chronic systolic (congestive) heart failure: Secondary | ICD-10-CM | POA: Diagnosis not present

## 2024-07-07 DIAGNOSIS — I480 Paroxysmal atrial fibrillation: Secondary | ICD-10-CM | POA: Diagnosis not present

## 2024-07-07 DIAGNOSIS — N1832 Chronic kidney disease, stage 3b: Secondary | ICD-10-CM | POA: Diagnosis not present

## 2024-07-07 DIAGNOSIS — I2510 Atherosclerotic heart disease of native coronary artery without angina pectoris: Secondary | ICD-10-CM | POA: Diagnosis not present

## 2024-07-07 LAB — GLUCOSE, CAPILLARY
Glucose-Capillary: 163 mg/dL — ABNORMAL HIGH (ref 70–99)
Glucose-Capillary: 111 mg/dL — ABNORMAL HIGH (ref 70–99)
Glucose-Capillary: 107 mg/dL — ABNORMAL HIGH (ref 70–99)
Glucose-Capillary: 98 mg/dL (ref 70–99)

## 2024-07-07 LAB — RENAL FUNCTION PANEL
Albumin: 2.6 g/dL — ABNORMAL LOW (ref 3.5–5.0)
Anion gap: 15 (ref 5–15)
BUN: 33 mg/dL — ABNORMAL HIGH (ref 8–23)
CO2: 24 mmol/L (ref 22–32)
Calcium: 8.6 mg/dL — ABNORMAL LOW (ref 8.9–10.3)
Chloride: 89 mmol/L — ABNORMAL LOW (ref 98–111)
Creatinine, Ser: 4.7 mg/dL — ABNORMAL HIGH (ref 0.44–1.00)
GFR, Estimated: 9 mL/min — ABNORMAL LOW (ref >60)
Glucose, Bld: 160 mg/dL — ABNORMAL HIGH (ref 70–99)
Phosphorus: 4.7 mg/dL — ABNORMAL HIGH (ref 2.5–4.6)
Potassium: 4.3 mmol/L (ref 3.5–5.1)
Sodium: 128 mmol/L — ABNORMAL LOW (ref 135–145)

## 2024-07-07 LAB — CBC
HCT: 30 % — ABNORMAL LOW (ref 36.0–46.0)
Hemoglobin: 10.4 g/dL — ABNORMAL LOW (ref 12.0–15.0)
MCH: 27.2 pg (ref 26.0–34.0)
MCHC: 34.7 g/dL (ref 30.0–36.0)
MCV: 78.3 fL — ABNORMAL LOW (ref 80.0–100.0)
Platelets: 176 K/uL (ref 150–400)
RBC: 3.83 MIL/uL — ABNORMAL LOW (ref 3.87–5.11)
RDW: 18.9 % — ABNORMAL HIGH (ref 11.5–15.5)
WBC: 7.3 K/uL (ref 4.0–10.5)
nRBC: 0.5 % — ABNORMAL HIGH (ref 0.0–0.2)

## 2024-07-07 MED ORDER — TRAMADOL HCL 50 MG PO TABS
50.0000 mg | ORAL_TABLET | Freq: Four times a day (QID) | ORAL | Status: DC | PRN
  Administered 2024-07-09 – 2024-07-16 (×7): 50 mg via ORAL
  Filled 2024-07-07 (×7): qty 1

## 2024-07-07 MED ORDER — NITROGLYCERIN 0.4 MG SL SUBL
SUBLINGUAL_TABLET | SUBLINGUAL | Status: AC
  Filled 2024-07-07: qty 1

## 2024-07-07 MED ORDER — MIDODRINE HCL 5 MG PO TABS
20.0000 mg | ORAL_TABLET | ORAL | Status: AC
  Administered 2024-07-07: 20 mg via ORAL
  Filled 2024-07-07: qty 4

## 2024-07-07 MED ORDER — LEVALBUTEROL HCL 0.63 MG/3ML IN NEBU
0.6300 mg | INHALATION_SOLUTION | RESPIRATORY_TRACT | Status: AC
  Administered 2024-07-07: 0.63 mg via RESPIRATORY_TRACT
  Filled 2024-07-07: qty 3

## 2024-07-07 MED ORDER — HEPARIN SODIUM (PORCINE) 1000 UNIT/ML IJ SOLN
INTRAMUSCULAR | Status: AC
  Filled 2024-07-07: qty 4

## 2024-07-07 NOTE — Progress Notes (Signed)
 OT Cancellation Note  Patient Details Name: [name] MRN: [id number] DOB: [birth year]   Cancelled Treatment:    Reason Eval/Treat Not Completed: Patient at procedure or test/ unavailable.  Pt. At HD.  Will check back as schedule allows.    [name]-COTA/L 07/07/2024, 10:17 AM

## 2024-07-07 NOTE — TOC Progression Note (Signed)
 Transition of Care [facility]) - Progression Note    Patient Details  Name: [name] MRN: [id number] Date of Birth: [birth year]  Transition of Care [facility]) CM/SW Contact  [name], [place] Phone Number: 07/07/2024, 8:22 AM  Clinical Narrative:   CSW will continue to follow. Awaiting decision on dialysis needs. Patient has not been clipped for outpatient HD, per Renal navigator.  CSW will continue to follow.    Expected Discharge Plan: Skilled Nursing Facility Barriers to Discharge: Continued Medical Work up               Expected Discharge Plan and Services   Discharge Planning Services: CM Consult Post Acute Care Choice: Skilled Nursing Facility Living arrangements for the past 2 months: Single Family Home                                       Social Drivers of Health (SDOH) Interventions SDOH Screenings   Food Insecurity: No Food Insecurity (06/18/2024)  Recent Concern: Food Insecurity - Food Insecurity Present (04/23/2024)  Housing: Low Risk  (06/18/2024)  Transportation Needs: No Transportation Needs (06/18/2024)  Utilities: Not At Risk (06/18/2024)  Alcohol Screen: Low Risk  (03/21/2024)  Depression (PHQ2-9): Low Risk  (04/23/2024)  Financial Resource Strain: Low Risk  (03/21/2024)  Physical Activity: Sufficiently Active (03/21/2024)  Social Connections: Moderately Integrated (06/18/2024)  Stress: No Stress Concern Present (03/21/2024)  Tobacco Use: Medium Risk (06/17/2024)  Health Literacy: Adequate Health Literacy (03/21/2024)    Readmission Risk Interventions     No data to display

## 2024-07-07 NOTE — Procedures (Signed)
 I was present at this dialysis session. I have reviewed the session itself and made appropriate changes. Bp's remain low but stable.  Has some edema and will try to increase UF today as tolerated.  Overall, not really improving.  Remains weak.  Palliative care and heart failure following.   Vital signs in last 24 hours:  Temp:  [97.4 F (36.3 C)-98.5 F (36.9 C)] 97.4 F (36.3 C) (10/18 0824) Pulse Rate:  [59-109] 60 (10/18 0830) Resp:  [18-28] 21 (10/18 0830) BP: (87-119)/(59-85) 103/67 (10/18 0824) SpO2:  [93 %-100 %] 99 % (10/18 0830) Weight:  [85.3 kg-86.1 kg] 85.3 kg (10/18 0824) Weight change: -1.2 kg Filed Weights   07/06/24 1028 07/07/24 0444 07/07/24 0824  Weight: 86.1 kg 85.4 kg 85.3 kg    Recent Labs  Lab 07/07/24 0505  NA 128*  K 4.3  CL 89*  CO2 24  GLUCOSE 160*  BUN 33*  CREATININE 4.70*  CALCIUM  8.6*  PHOS 4.7*    Recent Labs  Lab 07/04/24 0540 07/05/24 0928 07/06/24 0543  WBC 8.3 6.5 7.0  NEUTROABS  --  5.4  --   HGB 10.8* 9.8* 10.2*  HCT 31.2* 28.8* 28.9*  MCV 77.2* 77.4* 76.9*  PLT 164 170 165    Scheduled Meds:  amiodarone  200 mg Oral Daily   apixaban   5 mg Oral BID   atorvastatin   80 mg Oral Daily   Chlorhexidine  Gluconate Cloth  6 each Topical Daily   Chlorhexidine  Gluconate Cloth  6 each Topical Q0600   feeding supplement  237 mL Oral TID BM   guaiFENesin   600 mg Oral BID   insulin  aspart  0-5 Units Subcutaneous QHS   insulin  aspart  0-6 Units Subcutaneous TID WC   insulin  glargine-yfgn  10 Units Subcutaneous Daily   levothyroxine   50 mcg Oral Q0600   midodrine  20 mg Oral Q8H   multivitamin  1 tablet Oral QHS   scopolamine   1 patch Transdermal Q72H   sodium chloride  flush  10-40 mL Intracatheter Q12H   sodium chloride  flush  3 mL Intravenous Q12H   sodium chloride  flush  3 mL Intravenous Q12H   Continuous Infusions: PRN Meds:.acetaminophen  **OR** acetaminophen , albuterol , bisacodyl, calcium  carbonate, Gerhardt's butt cream,  guaiFENesin -dextromethorphan , loperamide, ondansetron  **OR** ondansetron  (ZOFRAN ) IV, mouth rinse, senna-docusate, sodium chloride  flush, sodium chloride  flush, trimethobenzamide    Assessment/Recommendations: [name] is a/an 73 y.o. female with a past medical history significant for systolic & diastolic heart failure s/p BiV ICD, CAD, OSA on BiPAP, LBBB, HTN, new Afib who presents to with Chest pain, SOB, cough.      Anuric AKI (not improving): Likely secondary to new RV heart failure, persistent atrial fibrillation, Cardiorenal Syndrome  -failed high dose diuretic challenge and CRRT initiated mainly for volume  Continued CRRT: 9/30-10/10 -did trial iHD 10/11 in setting of optimized hemodynamics; ok to use mid treatment midodrine for BP support but no plans for albumin or pressor support given not available outpt. UF goal 1L.  She did tolerate IHD despite low bp's. -plan next HD 10/18 pending course -Continue to monitor daily Cr, Dose meds for GFR<15 -Monitor Daily I/Os, Daily weight  -Maintain MAP>65 for optimal renal perfusion.  -Agree with holding RAASi, avoid further nephrotoxins including NSAIDS, Morphine .  Unless absolutely necessary, avoid CT with contrast and/or MRI with gadolinium.    - Appreciate IR assistance with RIJ TDC placed on 07/04/24.    Hyponatremia: Mild, stable.     AoC BiV  HF; New onset RV failure: pressors and inotropes weaned, on midodrine 20 TID now.  D/w cardiology - think additional cardiac improvement is unlikely.  Did tolerate HD yesterday with UF 1.5L which per I/Os would be appropriate to maintain euvolemia.  BP lower this AM and she was too fatigued yesterday to sit in chair.  At this point continue current care but in the coming days will need to see if she's overall strong enough to continue with dialysis and current therapies.     Atrial fibrillation: S/p cardioversion 10/5.  Remains in sinus rhythm.  On po amiodarone now.   CAD: On atorvastatin     Cont GOC discussion led by palliative and heart failure.  Plan continue dialysis as tolerated for now.    Christina DELENA Sellar,  MD 07/07/2024, 8:38 AM

## 2024-07-07 NOTE — Plan of Care (Signed)
  Problem: Education: Goal: Ability to describe self-care measures that may prevent or decrease complications (Diabetes Survival Skills Education) will improve Outcome: Progressing   Problem: Coping: Goal: Ability to adjust to condition or change in health will improve Outcome: Progressing   Problem: Fluid Volume: Goal: Ability to maintain a balanced intake and output will improve Outcome: Progressing   Problem: Nutritional: Goal: Maintenance of adequate nutrition will improve Outcome: Progressing   Problem: Skin Integrity: Goal: Risk for impaired skin integrity will decrease Outcome: Progressing   Problem: Coping: Goal: Level of anxiety will decrease Outcome: Progressing   Problem: Pain Managment: Goal: General experience of comfort will improve and/or be controlled Outcome: Progressing   Problem: Safety: Goal: Ability to remain free from injury will improve Outcome: Progressing

## 2024-07-07 NOTE — Progress Notes (Signed)
   07/07/24 1900  Assess: MEWS Score  Temp 98.1 F (36.7 C)  BP (!) 75/64  MAP (mmHg) 70  ECG Heart Rate 95  Resp (!) 22  Level of Consciousness Alert  SpO2 97 %  O2 Device Nasal Cannula  O2 Flow Rate (L/min) 4 L/min  Assess: MEWS Score  MEWS Temp 0  MEWS Systolic 2  MEWS Pulse 0  MEWS RR 1  MEWS LOC 0  MEWS Score 3  MEWS Score Color Yellow  Assess: if the MEWS score is Yellow or Red  Were vital signs accurate and taken at a resting state? Yes  Does the patient meet 2 or more of the SIRS criteria? No  MEWS guidelines implemented  Yes, yellow  Treat  MEWS Interventions Considered administering scheduled or prn medications/treatments as ordered  Take Vital Signs  Increase Vital Sign Frequency  Yellow: Q2hr x1, continue Q4hrs until patient remains green for 12hrs  Escalate  MEWS: Escalate Yellow: Discuss with charge nurse and consider notifying provider and/or RRT  Notify: Charge Nurse/RN  Name of Charge Nurse/RN Notified Tisha RN/CN  Provider Notification  Provider Name/Title Dr. JAYSON Hurst  Date Provider Notified 07/07/24  Time Provider Notified 1944  Method of Notification  (secure chat)  Notification Reason Change in status (BP low)  Provider response See new orders  Date of Provider Response 07/07/24  Time of Provider Response 1945  Assess: SIRS CRITERIA  SIRS Temperature  0  SIRS Respirations  1  SIRS Pulse 1  SIRS WBC 0  SIRS Score Sum  2

## 2024-07-07 NOTE — Progress Notes (Signed)
 Progress Note   Patient: [name] FMW:[id number] DOB: 1951/07/25 DOA: 06/17/2024     20 DOS: the patient was seen and examined on 07/07/2024   Brief hospital course: 73 y.o. with history of combined systolic and diastolic heart failure s/p BiV ICD, CAD, OSA on bipap, LBBB, HTN, hypotension, and atrial fibrillation.  - Admitted with volume overload, AKI - Echo 9/30 noted EF 20-25%, severe MR, moderately reduced RV - RHC 9/30> elevated PA pressures and filling pressures, PA PI of 1.6 - Treated with inotropes and vasopressors in ICU, now off - Complicated by worsening renal failure, cardiorenal syndrome, failed high-dose diuretic challenge, then started on CRRT from 9/30-10/10 -sp cardioversion for A-fib -Palliative care consulted and following - 10/11 initiated hemodialysis - Transferred out of ICU to [facility] service 10/14  Assessment and Plan: * Acute on chronic systolic CHF (congestive heart failure) (HCC) Echocardiogram with reduced LV systolic function, EF 20 to 25%, global hypokinesis, moderate dilatation of LV cavity, moderate LVH, interventricular septum flattened in systole, RV systolic function with moderate reduction, RVSP 38.0 mmHg, LA with moderate dilatation, severe mitral and tricuspid regurgitation,   Biventricular failure  Acute on chronic cor pulmonale  09/30 cardiac catheterization  RA 17  RV 53/17 PA 63/35 mean 46  PCWP 40  Cardiac output 5.7 index 2.8 (Fick) Cardiac output 3,7 index 1.8 (thermodilution) PVR 1.6 Wood units  Patient was placed on inotropic support for low output state/ cardiogenic shock, IV diuresis and vasopressors.   Required CRRT for worsening renal failure, now on intermittent HD. Limited medical therapy due to hypotension Currently on midodrine for blood pressure support.    Acute hypoxemic respiratory failure due to acute cardiogenic pulmonary edema and volume overload Clinically improving with ultrafiltration   Chronic kidney  disease, stage 3b (HCC) AKI oliguric, ATN Hyponatremia   Patient was placed on CRRT on 09/30 for volume overload, not responding to diuretic therapy.  10/11 transitioned to intermittent HD Blood pressure support with midodrine.  10/15 right internal jugular vein temporal dialysis catheter (tunneled) per IR   BUN today pre HD 33, with K at 4,3 and serum bicarbonate at 24  Na 128  Had HD toady   Coronary artery disease No chest pain, no acute coronary syndrome   Paroxysmal atrial fibrillation (HCC) 10/05 Sp TEE cardioversion. Plan to continue with amiodarone for rate and rhythm control Anticoagulation with apixaban     Essential hypertension Continue blood pressure support with midodrine   Type 2 diabetes mellitus with hyperlipidemia (HCC) Continue insulin  sliding scale for glucose cover and monitoring Basal insulin   Fasting glucose today 160 mg/dl   Hypothyroidism Continue levothyroxine    Malnutrition of moderate degree Continue with nutritional supplements   OSA (obstructive sleep apnea) Cpap Obesity class 1 with calculated BMI 31.5   Pressure injury of skin Continue local wound care   Chronic anemia Cell count has been stable      Subjective: patient having chest pain post HD, no dyspnea or nausea, no vomiting   Physical Exam: Vitals:   07/07/24 1200 07/07/24 1315 07/07/24 1517 07/07/24 1524  BP: 102/63 93/69 96/60  (!) 83/58  Pulse: 94 100 77 (!) 110  Resp: (!) 29 (!) 22 20 20   Temp: 97.7 F (36.5 C) 98.2 F (36.8 C)    TempSrc:  Oral    SpO2: 100% 100% 99% 99%  Weight: 85.3 kg     Height:       Neurology awake and alert, deconditioned ENT with mild pallor with no  icterus Cardiovascular with S1 and S2 present and regular with no gallops, rubs or murmurs Respiratory with no rales or wheezing, no rhonchi  Abdomen with no distention, soft and non tender No lower extremity edema  Data Reviewed:    Family Communication: grandson at the bedside    Disposition: Status is: Inpatient Remains inpatient appropriate because: pending SNF   Planned Discharge Destination: Skilled nursing facility      Author: Elidia Toribio Furnace, MD 07/07/2024 4:35 PM  For on call review [email or web address].

## 2024-07-07 NOTE — Progress Notes (Signed)
   07/06/24 2329  Assess: MEWS Score  Temp 97.7 F (36.5 C)  BP 93/70  MAP (mmHg) 78  Pulse Rate 62  ECG Heart Rate (!) 109  Resp 20  SpO2 99 %  O2 Device Nasal Cannula  O2 Flow Rate (L/min) 2 L/min  Assess: MEWS Score  MEWS Temp 0  MEWS Systolic 1  MEWS Pulse 1  MEWS RR 0  MEWS LOC 0  MEWS Score 2  MEWS Score Color Yellow  Assess: if the MEWS score is Yellow or Red  Were vital signs accurate and taken at a resting state? Yes  Does the patient meet 2 or more of the SIRS criteria? No  MEWS guidelines implemented  Yes, yellow  Treat  MEWS Interventions Considered administering scheduled or prn medications/treatments as ordered  Take Vital Signs  Increase Vital Sign Frequency  Yellow: Q2hr x1, continue Q4hrs until patient remains green for 12hrs  Escalate  MEWS: Escalate Yellow: Discuss with charge nurse and consider notifying provider and/or RRT  Notify: Charge Nurse/RN  Name of Charge Nurse/RN Notified [facility] RN/CN  Provider Notification  Provider Name/Title Dr. Terry Hurst  Date Provider Notified 07/06/24  Time Provider Notified 2337  Method of Notification Page (secure chat)  Notification Reason Change in status (Afib RVR)  Provider response No new orders (monitor pt.)  Date of Provider Response 07/07/24  Time of Provider Response 0000  Assess: SIRS CRITERIA  SIRS Temperature  0  SIRS Respirations  0  SIRS Pulse 1  SIRS WBC 0  SIRS Score Sum  1   Pt. Yellow mews Dr. Janese. EKG done.

## 2024-07-08 DIAGNOSIS — I10 Essential (primary) hypertension: Secondary | ICD-10-CM | POA: Diagnosis not present

## 2024-07-08 DIAGNOSIS — N1832 Chronic kidney disease, stage 3b: Secondary | ICD-10-CM | POA: Diagnosis not present

## 2024-07-08 DIAGNOSIS — I5023 Acute on chronic systolic (congestive) heart failure: Secondary | ICD-10-CM | POA: Diagnosis not present

## 2024-07-08 DIAGNOSIS — I2510 Atherosclerotic heart disease of native coronary artery without angina pectoris: Secondary | ICD-10-CM | POA: Diagnosis not present

## 2024-07-08 LAB — RENAL FUNCTION PANEL
Albumin: 2.5 g/dL — ABNORMAL LOW (ref 3.5–5.0)
Anion gap: 13 (ref 5–15)
BUN: 26 mg/dL — ABNORMAL HIGH (ref 8–23)
CO2: 25 mmol/L (ref 22–32)
Calcium: 8.3 mg/dL — ABNORMAL LOW (ref 8.9–10.3)
Chloride: 90 mmol/L — ABNORMAL LOW (ref 98–111)
Creatinine, Ser: 3.67 mg/dL — ABNORMAL HIGH (ref 0.44–1.00)
GFR, Estimated: 12 mL/min — ABNORMAL LOW (ref >60)
Glucose, Bld: 151 mg/dL — ABNORMAL HIGH (ref 70–99)
Phosphorus: 4.2 mg/dL (ref 2.5–4.6)
Potassium: 4.3 mmol/L (ref 3.5–5.1)
Sodium: 128 mmol/L — ABNORMAL LOW (ref 135–145)

## 2024-07-08 LAB — GLUCOSE, CAPILLARY
Glucose-Capillary: 166 mg/dL — ABNORMAL HIGH (ref 70–99)
Glucose-Capillary: 176 mg/dL — ABNORMAL HIGH (ref 70–99)
Glucose-Capillary: 193 mg/dL — ABNORMAL HIGH (ref 70–99)
Glucose-Capillary: 193 mg/dL — ABNORMAL HIGH (ref 70–99)

## 2024-07-08 NOTE — Progress Notes (Signed)
 Progress Note   Patient: [name] FMW:[id number] DOB: 07/13/1951 DOA: 06/17/2024     21 DOS: the patient was seen and examined on 07/08/2024   Brief hospital course: 73 y.o. with history of combined systolic and diastolic heart failure s/p BiV ICD, CAD, OSA on bipap, LBBB, HTN, hypotension, and atrial fibrillation.  - Admitted with volume overload, AKI - Echo 9/30 noted EF 20-25%, severe MR, moderately reduced RV - RHC 9/30> elevated PA pressures and filling pressures, PA PI of 1.6 - Treated with inotropes and vasopressors in ICU, now off - Complicated by worsening renal failure, cardiorenal syndrome, failed high-dose diuretic challenge, then started on CRRT from 9/30-10/10 -sp cardioversion for A-fib -Palliative care consulted and following - 10/11 initiated hemodialysis - Transferred out of ICU to [facility] service 10/14  Assessment and Plan: * Acute on chronic systolic CHF (congestive heart failure) (HCC) Echocardiogram with reduced LV systolic function, EF 20 to 25%, global hypokinesis, moderate dilatation of LV cavity, moderate LVH, interventricular septum flattened in systole, RV systolic function with moderate reduction, RVSP 38.0 mmHg, LA with moderate dilatation, severe mitral and tricuspid regurgitation,   Biventricular failure  Acute on chronic cor pulmonale  09/30 cardiac catheterization  RA 17  RV 53/17 PA 63/35 mean 46  PCWP 40  Cardiac output 5.7 index 2.8 (Fick) Cardiac output 3,7 index 1.8 (thermodilution) PVR 1.6 Wood units  Patient was placed on inotropic support for low output state/ cardiogenic shock, IV diuresis and vasopressors.   Required CRRT for worsening renal failure, now on intermittent HD. Limited medical therapy due to hypotension Currently on midodrine for blood pressure support.    Acute hypoxemic respiratory failure due to acute cardiogenic pulmonary edema and volume overload Clinically improving with ultrafiltration   Chronic kidney  disease, stage 3b (HCC) AKI oliguric, ATN Hyponatremia   Patient was placed on CRRT on 09/30 for volume overload, not responding to diuretic therapy.  10/11 transitioned to intermittent HD Blood pressure support with midodrine.  10/15 right internal jugular vein temporal dialysis catheter (tunneled) per IR   Today BUN is down to 26 with K at 4.3 and serum bicarbonate at 25  Na 128  Continue renal replacement therapy per Nephrology recommendations.   Coronary artery disease No chest pain, no acute coronary syndrome   Paroxysmal atrial fibrillation (HCC) 10/05 Sp TEE cardioversion. Plan to continue with amiodarone for rate and rhythm control Anticoagulation with apixaban   Essential hypertension Continue blood pressure support with midodrine   Type 2 diabetes mellitus with hyperlipidemia (HCC) Continue insulin  sliding scale for glucose cover and monitoring Basal insulin   Fasting glucose today 151 mg/dl   Hypothyroidism Continue levothyroxine    Malnutrition of moderate degree Continue with nutritional supplements   OSA (obstructive sleep apnea) Cpap Obesity class 1 with calculated BMI 31.5   Pressure injury of skin Continue local wound care  Chronic anemia Cell count has been stable      Subjective: Patient with no further chest pain today, no dyspnea, continue very weak and deconditioned   Physical Exam: Vitals:   07/08/24 0624 07/08/24 0705 07/08/24 1011 07/08/24 1013  BP:  105/79 109/76 97/67  Pulse:  100    Resp:  20 19 20   Temp:  (!) 97.4 F (36.3 C)    TempSrc:  Oral    SpO2:  98%    Weight: 83.9 kg     Height:       Neurology awake and alert ENT with mild pallor Cardiovascular with S1 and S2  present and regular with no gallops, rubs or murmurs Respiratory with poor inspiratory effort, anterior auscultation with bilateral rales with no wheezing Abdomen with no distention  Positive lower extremity edema ++  Data Reviewed:    Family  Communication: no family at the bedside   Disposition: Status is: Inpatient Remains inpatient appropriate because: renal replacement therapy and SNF placement   Planned Discharge Destination: Skilled nursing facility     Author: Elidia Toribio Furnace, MD 07/08/2024 4:39 PM  For on call review [email or web address].

## 2024-07-08 NOTE — Plan of Care (Signed)
  Problem: Skin Integrity: Goal: Risk for impaired skin integrity will decrease Outcome: Progressing   Problem: Clinical Measurements: Goal: Respiratory complications will improve Outcome: Progressing   Problem: Nutrition: Goal: Adequate nutrition will be maintained Outcome: Progressing   Problem: Pain Managment: Goal: General experience of comfort will improve and/or be controlled Outcome: Progressing   Problem: Safety: Goal: Ability to remain free from injury will improve Outcome: Progressing

## 2024-07-08 NOTE — Progress Notes (Signed)
 Occupational Therapy Treatment Patient Details Name: [name] MRN: [id number] DOB: 06/24/1951 Today's Date: 07/08/2024   History of present illness Pt is a 73 y/o F presenting to ED on 9/29 with chest discomfort and SOB. CT chest with R > L pleural effusions, Admitted for acute on chronic CHF and AKI, started on CRRT 9/30. S/p successful DCCV on 10/5. Off CRRT 10/9. PMH includes CAD, CHF s/p defibrillator, A fib on eliquis , pacemaker, anemia, arthritis, HTN, hypothyroidism, DM, cardiomyopathy.   OT comments  Pt. Seen for skilled OT treatment session.  Pt. Able to complete bed mobility with MOD A to sit eob.  Seated eob with varying levels of trunk support required from CGA to MINA.  Session limited secondary to reports of dizziness that did not subside.  Assisted back to bed with MAX  A for bringing BLEs into bed.  RN updated on BP taken while sitting eob during dizziness.  97/67.  Cont. With acute OT POC and progress activity tolerance/ADLs next session as pt. Able.        If plan is discharge home, recommend the following:  A little help with walking and/or transfers;A lot of help with bathing/dressing/bathroom;Assistance with cooking/housework;Assist for transportation;Help with stairs or ramp for entrance   Equipment Recommendations  BSC/3in1    Recommendations for Other Services      Precautions / Restrictions Precautions Precautions: Fall Recall of Precautions/Restrictions: Impaired Precaution/Restrictions Comments: off CRRT 10/9, watch BP nephro wants to try to keep MAP >65       Mobility Bed Mobility Overal bed mobility: Needs Assistance Bed Mobility: Rolling, Sidelying to Sit, Sit to Sidelying Rolling: Min assist, Mod assist Sidelying to sit: Mod assist     Sit to sidelying: Mod assist, with MAX A for BLEs into bed General bed mobility comments: rolled L with mod a and cues for hand placement on bed rails, mod/max a to transtion into sitting.  mod a with use of  pads to scoot towards eob.  pt. with no overt lob but some occasional swaying due to visible deconditioning and reported dizziness.  CGA/MINA  while in un supported sitting.  sat eob for approx. 5 min. pt. in L sidelying and max a for bringing bles into bed. use of bed functions, and pads to pull pt. up in bed.  posistioned pillows under buttocks on each side per pt. request.    Transfers                   General transfer comment: deferred sit/stand, attempts at transfers secondary to pt.s dizziness woud not subide, BP taken and had dropped from previous reading.  RN made aware     Balance                                           ADL either performed or assessed with clinical judgement   ADL Overall ADL's : Needs assistance/impaired                                       General ADL Comments: focus of session, bed mobility and sitting eob in prep for increasing activity tolerance for mobility/ADLS    Extremity/Trunk Assessment              Vision  Perception     Praxis     Communication Communication Communication: No apparent difficulties   Cognition Arousal: Alert Behavior During Therapy: WFL for tasks assessed/performed, Flat affect Cognition: No apparent impairments                               Following commands: Impaired Following commands impaired: Follows one step commands with increased time      Cueing   Cueing Techniques: Verbal cues  Exercises      Shoulder Instructions       General Comments      Pertinent Vitals/ Pain       Pain Assessment Pain Assessment: No/denies pain  Home Living                                          Prior Functioning/Environment              Frequency  Min 2X/week        Progress Toward Goals  OT Goals(current goals can now be found in the care plan section)  Progress towards OT goals: Progressing toward goals      Plan      Co-evaluation                 AM-PAC OT 6 Clicks Daily Activity     Outcome Measure   Help from another person eating meals?: A Little Help from another person taking care of personal grooming?: A Little Help from another person toileting, which includes using toliet, bedpan, or urinal?: A Lot Help from another person bathing (including washing, rinsing, drying)?: A Lot Help from another person to put on and taking off regular upper body clothing?: A Lot Help from another person to put on and taking off regular lower body clothing?: A Lot 6 Click Score: 14    End of Session Equipment Utilized During Treatment: Oxygen  OT Visit Diagnosis: Unsteadiness on feet (R26.81);Other abnormalities of gait and mobility (R26.89);Muscle weakness (generalized) (M62.81)   Activity Tolerance Other (comment) (pt. limited by dizziness, rn aware)   Patient Left in bed;with call bell/phone within reach;with bed alarm set   Nurse Communication Other (comment) (reviewed limited session with rn secondary to dizziness reports from pt., updated rn with latest bp taken)        Time: 8995-8977 OT Time Calculation (min): 18 min  Charges: OT General Charges $OT Visit: 1 Visit OT Treatments $Therapeutic Activity: 8-22 mins  Randall, COTA/L Acute Rehabilitation [phone]   [name]-COTA/L  07/08/2024, 11:37 AM

## 2024-07-08 NOTE — Progress Notes (Signed)
 Patient ID: [name], female   DOB: December 27, 1950, 73 y.o.   MRN: [id number] S: No complaints.  Tolerating HD fairly well. O:BP 105/79 (BP Location: Left Arm)   Pulse 100   Temp (!) 97.4 F (36.3 C) (Oral)   Resp 20   Ht 5' 5 (1.651 m)   Wt 83.9 kg   SpO2 98%   BMI 30.78 kg/m   Intake/Output Summary (Last 24 hours) at 07/08/2024 1011 Last data filed at 07/07/2024 2012 Gross per 24 hour  Intake 240 ml  Output 500 ml  Net -260 ml   Intake/Output: I/O last 3 completed shifts: In: 480 [P.O.:480] Out: 500 [Other:500]  Intake/Output this shift:  No intake/output data recorded. Weight change: -0.8 kg Gen: NAD CVS: tachy Resp:CTA Abd:+BS, soft, NT/ND Ext: no edema  Recent Labs  Lab 07/02/24 0458 07/03/24 0518 07/04/24 0540 07/05/24 0445 07/06/24 0543 07/07/24 0505 07/08/24 0525  NA 128* 127* 128* 129* 128* 128* 128*  K 4.7 5.0 4.4 5.1 4.1 4.3 4.3  CL 94* 92* 93* 92* 91* 89* 90*  CO2 24 22 24 23 24 24 25   GLUCOSE 219* 257* 165* 103* 138* 160* 151*  BUN 28* 41* 27* 35* 24* 33* 26*  CREATININE 4.21* 5.42* 4.03* 5.06* 3.74* 4.70* 3.67*  ALBUMIN  --   --   --   --   --  2.6* 2.5*  CALCIUM  8.6* 8.7* 8.4* 8.7* 8.4* 8.6* 8.3*  PHOS  --   --   --   --   --  4.7* 4.2   Liver Function Tests: Recent Labs  Lab 07/07/24 0505 07/08/24 0525  ALBUMIN 2.6* 2.5*   No results for input(s): LIPASE, AMYLASE in the last 168 hours. No results for input(s): AMMONIA in the last 168 hours. CBC: Recent Labs  Lab 07/03/24 0518 07/04/24 0540 07/05/24 0928 07/06/24 0543 07/07/24 0850  WBC 9.2 8.3 6.5 7.0 7.3  NEUTROABS  --   --  5.4  --   --   HGB 10.2* 10.8* 9.8* 10.2* 10.4*  HCT 29.0* 31.2* 28.8* 28.9* 30.0*  MCV 77.5* 77.2* 77.4* 76.9* 78.3*  PLT 157 164 170 165 176   Cardiac Enzymes: No results for input(s): CKTOTAL, CKMB, CKMBINDEX, TROPONINI in the last 168 hours. CBG: Recent Labs  Lab 07/07/24 0602 07/07/24 1314 07/07/24 1650 07/07/24 2011  07/08/24 0612  GLUCAP 163* 111* 107* 98 166*    Iron Studies: No results for input(s): IRON, TIBC, TRANSFERRIN, FERRITIN in the last 72 hours. Studies/Results: No results found.  amiodarone  200 mg Oral Daily   apixaban   5 mg Oral BID   atorvastatin   80 mg Oral Daily   Chlorhexidine  Gluconate Cloth  6 each Topical Daily   Chlorhexidine  Gluconate Cloth  6 each Topical Q0600   feeding supplement  237 mL Oral TID BM   guaiFENesin   600 mg Oral BID   insulin  aspart  0-5 Units Subcutaneous QHS   insulin  aspart  0-6 Units Subcutaneous TID WC   insulin  glargine-yfgn  10 Units Subcutaneous Daily   levothyroxine   50 mcg Oral Q0600   midodrine  20 mg Oral Q8H   multivitamin  1 tablet Oral QHS   scopolamine   1 patch Transdermal Q72H   sodium chloride  flush  10-40 mL Intracatheter Q12H   sodium chloride  flush  3 mL Intravenous Q12H   sodium chloride  flush  3 mL Intravenous Q12H    BMET    Component Value Date/Time   NA 128 (L)  07/08/2024 0525   NA 138 06/12/2024 1638   K 4.3 07/08/2024 0525   CL 90 (L) 07/08/2024 0525   CO2 25 07/08/2024 0525   GLUCOSE 151 (H) 07/08/2024 0525   BUN 26 (H) 07/08/2024 0525   BUN 36 (H) 06/12/2024 1638   CREATININE 3.67 (H) 07/08/2024 0525   CREATININE 1.32 (H) 03/14/2020 1524   CALCIUM  8.3 (L) 07/08/2024 0525   GFRNONAA 12 (L) 07/08/2024 0525   GFRNONAA 40 (L) 03/14/2020 1524   GFRAA 47 (L) 03/14/2020 1524   CBC    Component Value Date/Time   WBC 7.3 07/07/2024 0850   RBC 3.83 (L) 07/07/2024 0850   HGB 10.4 (L) 07/07/2024 0850   HGB 12.0 06/12/2024 1638   HCT 30.0 (L) 07/07/2024 0850   HCT 37.1 06/12/2024 1638   PLT 176 07/07/2024 0850   PLT 214 06/12/2024 1638   MCV 78.3 (L) 07/07/2024 0850   MCV 89 06/12/2024 1638   MCH 27.2 07/07/2024 0850   MCHC 34.7 07/07/2024 0850   RDW 18.9 (H) 07/07/2024 0850   RDW 20.0 (H) 06/12/2024 1638   LYMPHSABS 0.6 (L) 07/05/2024 0928   LYMPHSABS 1.0 06/13/2023 1356   MONOABS 0.4 07/05/2024 0928    EOSABS 0.0 07/05/2024 0928   EOSABS 0.3 06/13/2023 1356   BASOSABS 0.0 07/05/2024 0928   BASOSABS 0.0 06/13/2023 1356    Assessment/Recommendations: [name] is a/an 73 y.o. female with a past medical history significant for systolic & diastolic heart failure s/p BiV ICD, CAD, OSA on BiPAP, LBBB, HTN, new Afib who presents to with Chest pain, SOB, cough.      Anuric AKI (not improving): Likely secondary to new RV heart failure, persistent atrial fibrillation, Cardiorenal Syndrome  -failed high dose diuretic challenge and CRRT initiated mainly for volume  Continued CRRT: 9/30-10/10 -did trial iHD 10/11 in setting of optimized hemodynamics; ok to use mid treatment midodrine for BP support but no plans for albumin or pressor support given not available outpt. UF goal 1L.  She did tolerate IHD despite low bp's yesterday. -plan next HD 10/21 and keep on TTS schedule for now pending course -Continue to monitor daily Cr, Dose meds for GFR<15 -Monitor Daily I/Os, Daily weight  -Maintain MAP>65 for optimal renal perfusion.  -Agree with holding RAASi, avoid further nephrotoxins including NSAIDS, Morphine .  Unless absolutely necessary, avoid CT with contrast and/or MRI with gadolinium.    - Appreciate IR assistance with RIJ TDC placed on 07/04/24.  -continue midodrine   Hyponatremia: Mild, stable.     AoC BiV HF; New onset RV failure: pressors and inotropes weaned, on midodrine 20 TID now.  D/w cardiology - think additional cardiac improvement is unlikely.  Did tolerate HD yesterday with UF 1.5L which per I/Os would be appropriate to maintain euvolemia.  BP lower this AM and she was too fatigued yesterday to sit in chair.  At this point continue current care but in the coming days will need to see if she's overall strong enough to continue with dialysis and current therapies.     Atrial fibrillation: S/p cardioversion 10/5.  Remains in sinus rhythm.  On po amiodarone now.   CAD: On  atorvastatin   Deconditioning - continue with PT/OT.  Awaiting SNF placement   Cont GOC discussion led by palliative and heart failure.  Plan continue dialysis as tolerated for now.   Christina Christina Sellar, MD [facility]

## 2024-07-09 DIAGNOSIS — R531 Weakness: Secondary | ICD-10-CM | POA: Diagnosis not present

## 2024-07-09 DIAGNOSIS — I2510 Atherosclerotic heart disease of native coronary artery without angina pectoris: Secondary | ICD-10-CM | POA: Diagnosis not present

## 2024-07-09 DIAGNOSIS — I5023 Acute on chronic systolic (congestive) heart failure: Secondary | ICD-10-CM | POA: Diagnosis not present

## 2024-07-09 DIAGNOSIS — N1832 Chronic kidney disease, stage 3b: Secondary | ICD-10-CM | POA: Diagnosis not present

## 2024-07-09 DIAGNOSIS — I10 Essential (primary) hypertension: Secondary | ICD-10-CM | POA: Diagnosis not present

## 2024-07-09 DIAGNOSIS — Z7189 Other specified counseling: Secondary | ICD-10-CM | POA: Diagnosis not present

## 2024-07-09 DIAGNOSIS — N186 End stage renal disease: Secondary | ICD-10-CM | POA: Diagnosis not present

## 2024-07-09 LAB — GLUCOSE, CAPILLARY
Glucose-Capillary: 172 mg/dL — ABNORMAL HIGH (ref 70–99)
Glucose-Capillary: 170 mg/dL — ABNORMAL HIGH (ref 70–99)
Glucose-Capillary: 175 mg/dL — ABNORMAL HIGH (ref 70–99)
Glucose-Capillary: 207 mg/dL — ABNORMAL HIGH (ref 70–99)

## 2024-07-09 LAB — RENAL FUNCTION PANEL
Albumin: 2.6 g/dL — ABNORMAL LOW (ref 3.5–5.0)
Anion gap: 14 (ref 5–15)
BUN: 33 mg/dL — ABNORMAL HIGH (ref 8–23)
CO2: 23 mmol/L (ref 22–32)
Calcium: 8.7 mg/dL — ABNORMAL LOW (ref 8.9–10.3)
Chloride: 89 mmol/L — ABNORMAL LOW (ref 98–111)
Creatinine, Ser: 4.79 mg/dL — ABNORMAL HIGH (ref 0.44–1.00)
GFR, Estimated: 9 mL/min — ABNORMAL LOW (ref >60)
Glucose, Bld: 170 mg/dL — ABNORMAL HIGH (ref 70–99)
Phosphorus: 5.3 mg/dL — ABNORMAL HIGH (ref 2.5–4.6)
Potassium: 4.8 mmol/L (ref 3.5–5.1)
Sodium: 126 mmol/L — ABNORMAL LOW (ref 135–145)

## 2024-07-09 LAB — CBC
HCT: 29.4 % — ABNORMAL LOW (ref 36.0–46.0)
Hemoglobin: 10.2 g/dL — ABNORMAL LOW (ref 12.0–15.0)
MCH: 26.9 pg (ref 26.0–34.0)
MCHC: 34.7 g/dL (ref 30.0–36.0)
MCV: 77.6 fL — ABNORMAL LOW (ref 80.0–100.0)
Platelets: 168 K/uL (ref 150–400)
RBC: 3.79 MIL/uL — ABNORMAL LOW (ref 3.87–5.11)
RDW: 18.8 % — ABNORMAL HIGH (ref 11.5–15.5)
WBC: 7.1 K/uL (ref 4.0–10.5)
nRBC: 0.6 % — ABNORMAL HIGH (ref 0.0–0.2)

## 2024-07-09 MED ORDER — POLYETHYLENE GLYCOL 3350 17 G PO PACK
17.0000 g | PACK | Freq: Two times a day (BID) | ORAL | Status: DC
  Administered 2024-07-10 – 2024-07-14 (×7): 17 g via ORAL
  Filled 2024-07-09 (×10): qty 1

## 2024-07-09 NOTE — TOC Progression Note (Addendum)
 Transition of Care [facility]) - Progression Note    Patient Details  Name: [name] MRN: [id number] Date of Birth: 06/11/1951  [facility] of [facility] CM/SW Contact  [name] Phone Number: [phone] 07/09/2024, 1:57 PM  Clinical Narrative:   CSW started patients auth, pending.   CSW spoke with the patients son to discuss the dc plan. Patients son is agreeable for auth at [facility]   CSW spoke with Garrel at [facility] to discuss dc plan. CSW informed mike that the nephrology team is working to establish care at [facility] as that is the closest HD facility to SNF.   Patients shara was approved:  ID: [id number] SNF 07/09/2024 07/09/2024-07/11/2024  Barrier to dc: HD facility not established. Waiting on nephrology team to confirm.    Expected Discharge Plan: Skilled Nursing Facility Barriers to Discharge: Continued Medical Work up               Expected Discharge Plan and Services   Discharge Planning Services: CM Consult Post Acute Care Choice: Skilled Nursing Facility Living arrangements for the past 2 months: Single Family Home                                       Social Drivers of Health (SDOH) Interventions SDOH Screenings   Food Insecurity: No Food Insecurity (06/18/2024)  Recent Concern: Food Insecurity - Food Insecurity Present (04/23/2024)  Housing: Low Risk  (06/18/2024)  Transportation Needs: No Transportation Needs (06/18/2024)  Utilities: Not At Risk (06/18/2024)  Alcohol Screen: Low Risk  (03/21/2024)  Depression (PHQ2-9): Low Risk  (04/23/2024)  Financial Resource Strain: Low Risk  (03/21/2024)  Physical Activity: Sufficiently Active (03/21/2024)  Social Connections: Moderately Integrated (06/18/2024)  Stress: No Stress Concern Present (03/21/2024)  Tobacco Use: Medium Risk (06/17/2024)  Health Literacy: Adequate Health Literacy (03/21/2024)    Readmission Risk Interventions     No data to display

## 2024-07-09 NOTE — Progress Notes (Signed)
 Patient ID: [name], female   DOB: July 09, 1951, 73 y.o.   MRN: [id number]    Progress Note from the Palliative Medicine Team at [facility]   Patient Name: ALVINE MOSTAFA        Date: 07/09/2024 DOB: 10-04-1950  Age: 73 y.o. MRN#: [id number] Attending Physician: Noralee Elidia Toribio DEWAINE Primary Care Physician: Tobie Suzzane POUR, MD Admit Date: 06/17/2024   Reason for Consultation/Follow-up   Establishing Goals of Care   HPI/ Brief Hospital Review   73 y.o. female   admitted on 06/17/2024 with  medical history significant for CAD, persistent atrial fibrillation on Eliquis , chronic HFmrEF s/p CRT-D, LBBB, T2DM, HTN, HLD, CKD stage IIIa, hypothyroidism, OSA who presented to the ED for evaluation of chest pain, dyspnea, cough, increased swelling bilateral lower extremities  Hospitalization complicated by worsening renal function,  now on HD and tolerating this time.  Transferred out of ICU yesterday    Multiple comorbidities, is high risk for decompensation  Patient and her family face ongoing decisions regarding treatment options, advanced directives and anticipatory care needs.     Subjective  Extensive chart review has been completed prior to meeting with patient  including labs, vital signs, imaging, progress/consult notes, orders, medications and available advance directive documents.    This NP assessed patient at the bedside as a follow up for palliative medicine needs and emotional support. Alert and oriented, to HD this morning  I was able to speak with son/Brooks today by telephone.    Ongoing education regarding complex current medical situation.   All are hopeful for ongoing improvement.    In conversations regarding transition of care.  Son is not impressed with list of  facilities given to him to consider for OP short term rehab. He is exploring all options.  He recognizes his mother's increased care needs at this time.    Education offered on the importance of  ongoing conversation with patient and the medical providers regarding overall plan of care and treatment options to ensure decisions are within her values and goals of care.  PMT will continue to support holistically  Questions and concerns addressed   Discussed with nursing staff  Time: 25  minutes  Detailed review of medical records ( labs, imaging, vital signs), medically appropriate exam ( MS, skin, cardiac,  resp)   discussed with treatment team, counseling and education to patient, family, staff, documenting clinical information, medication management, coordination of care    Ronal Plants NP  Palliative Medicine Team Team Phone # [phone] Pager [phone]

## 2024-07-09 NOTE — Plan of Care (Signed)
   Problem: Nutritional: Goal: Maintenance of adequate nutrition will improve Outcome: Progressing   Problem: Skin Integrity: Goal: Risk for impaired skin integrity will decrease Outcome: Progressing   Problem: Education: Goal: Knowledge of General Education information will improve Description: Including pain rating scale, medication(s)/side effects and non-pharmacologic comfort measures Outcome: Progressing

## 2024-07-09 NOTE — Progress Notes (Signed)
 Progress Note   Patient: [name] FMW:[id number] DOB: 1951-05-15 DOA: 06/17/2024     22 DOS: the patient was seen and examined on 07/09/2024   Brief hospital course: 73 y.o. with history of combined systolic and diastolic heart failure s/p BiV ICD, CAD, OSA on bipap, LBBB, HTN, hypotension, and atrial fibrillation.  - Admitted with volume overload, AKI - Echo 9/30 noted EF 20-25%, severe MR, moderately reduced RV - RHC 9/30> elevated PA pressures and filling pressures, PA PI of 1.6 - Treated with inotropes and vasopressors in ICU, now off - Complicated by worsening renal failure, cardiorenal syndrome, failed high-dose diuretic challenge, then started on CRRT from 9/30-10/10 -sp cardioversion for A-fib -Palliative care consulted and following - 10/11 initiated hemodialysis - Transferred out of ICU to [facility] service 10/14  10/20 pending HD and SNF arrangement   Assessment and Plan: * Acute on chronic systolic CHF (congestive heart failure) (HCC) Echocardiogram with reduced LV systolic function, EF 20 to 25%, global hypokinesis, moderate dilatation of LV cavity, moderate LVH, interventricular septum flattened in systole, RV systolic function with moderate reduction, RVSP 38.0 mmHg, LA with moderate dilatation, severe mitral and tricuspid regurgitation,   Biventricular failure  Acute on chronic cor pulmonale  09/30 cardiac catheterization  RA 17  RV 53/17 PA 63/35 mean 46  PCWP 40  Cardiac output 5.7 index 2.8 (Fick) Cardiac output 3,7 index 1.8 (thermodilution) PVR 1.6 Wood units  Patient was placed on inotropic support for low output state/ cardiogenic shock, IV diuresis and vasopressors.   Required CRRT for worsening renal failure, now on intermittent HD. Limited medical therapy due to hypotension Currently on midodrine for blood pressure support.    Acute hypoxemic respiratory failure due to acute cardiogenic pulmonary edema and volume overload Clinically improving with  ultrafiltration   Chronic kidney disease, stage 3b (HCC) AKI oliguric, ATN Hyponatremia   Patient was placed on CRRT on 09/30 for volume overload, not responding to diuretic therapy.  10/11 transitioned to intermittent HD Blood pressure support with midodrine.  10/15 right internal jugular vein temporal dialysis catheter (tunneled) per IR   BUN 33, K 4,8 and bicarbonate 23 P 5.3 Plan for HD tomorrow  Continue renal replacement therapy per Nephrology recommendations.   Coronary artery disease No chest pain, no acute coronary syndrome   Paroxysmal atrial fibrillation (HCC) 10/05 Sp TEE cardioversion. Plan to continue with amiodarone for rate and rhythm control Anticoagulation with apixaban   Essential hypertension Continue blood pressure support with midodrine   Type 2 diabetes mellitus with hyperlipidemia (HCC) Continue insulin  sliding scale for glucose cover and monitoring Basal insulin   Fasting glucose today 170 mg/dl   Hypothyroidism Continue levothyroxine    Malnutrition of moderate degree Continue with nutritional supplements   OSA (obstructive sleep apnea) Cpap Obesity class 1 with calculated BMI 31.5   Pressure injury of skin Continue local wound care  Chronic anemia Cell count has been stable        Subjective: Patient is feeling well, no further chest pain, continue very weak and deconditioned   Physical Exam: Vitals:   07/09/24 1121 07/09/24 1505 07/09/24 1845 07/09/24 1918  BP: 102/63 97/66  92/64  Pulse: 96 93  90  Resp: (!) 23 20  15   Temp: 97.7 F (36.5 C) 97.7 F (36.5 C)  97.7 F (36.5 C)  TempSrc: Oral Oral  Oral  SpO2: 95% 96% 94% 97%  Weight:      Height:       Neurology awake and alert,  deconditioned ENT with mild pallor Cardiovascular with S1 and S2 present and regular with no gallops Respiratory with rales with no wheezing Abdomen with no distention Positive lower extremity edema + Data Reviewed:    Family  Communication: no family at the bedside   Disposition: Status is: Inpatient Remains inpatient appropriate because: pending outpatient HD arrangement   Planned Discharge Destination: Skilled nursing facility      Author: Elidia Toribio Furnace, MD 07/09/2024 9:43 PM  For on call review [email or web address].

## 2024-07-09 NOTE — Progress Notes (Signed)
 [facility] Progress Note    Assessment/ Plan:   Anuric AKI (not improving) on CKD3b: Likely secondary to new RV heart failure, persistent atrial fibrillation, Cardiorenal Syndrome  -failed high dose diuretic challenge and CRRT initiated mainly for volume  Continued CRRT: 9/30-10/10. S/p TDC placement with IR on 10/15 -no evidence of renal recovery at this junction -plan next HD 10/21 and keep on TTS schedule for now pending course, will attempt HD in recliner -continue midodrine, will try avoid albumin -Avoid nephrotoxic medications including NSAIDs and iodinated intravenous contrast exposure unless the latter is absolutely indicated.  Preferred narcotic agents for pain control are hydromorphone , fentanyl , and methadone. Morphine  should not be used. Avoid Baclofen and avoid oral sodium phosphate and magnesium  citrate based laxatives / bowel preps. Continue strict Input and Output monitoring. Will monitor the patient closely with you and intervene or adjust therapy as indicated by changes in clinical status/labs    Hyponatremia: Mild, stable.  Will attempt to manage with HD/UF   AoC BiV HF; New onset RV failure: pressors and inotropes weaned, on midodrine 20 TID now.  D/w cardiology in the past- think additional cardiac improvement is unlikely.  At this point continue current care but in the coming days will need to see if she's overall strong enough to continue with dialysis and current therapies.     Atrial fibrillation: S/p cardioversion 10/5.  Remains in sinus rhythm.  On po amiodarone now.   CAD: On atorvastatin    Deconditioning - continue with PT/OT.  Awaiting SNF placement  Ephriam Stank, MD [facility]  Subjective:   Patient seen and examined in room. Currently doing PT. No complaints.   Objective:   BP 102/67 (BP Location: Left Arm)   Pulse 93   Temp 97.8 F (36.6 C) (Oral)   Resp 19   Ht 5' 5 (1.651 m)   Wt 86.6 kg   SpO2 95%   BMI 31.77  kg/m   Intake/Output Summary (Last 24 hours) at 07/09/2024 9082 Last data filed at 07/09/2024 0900 Gross per 24 hour  Intake 360 ml  Output --  Net 360 ml   Weight change: -1.5 kg  Physical Exam: Gen: NAD CVS: RRR Resp: CTA BL Abd: soft Ext: trace edema b/l Les Neuro: awake, alert, participating with PT Dialysis access: RIJ TDC c/d/i  Imaging: No results found.  Labs: BMET Recent Labs  Lab 07/03/24 0518 07/04/24 0540 07/05/24 0445 07/06/24 0543 07/07/24 0505 07/08/24 0525 07/09/24 0523  NA 127* 128* 129* 128* 128* 128* 126*  K 5.0 4.4 5.1 4.1 4.3 4.3 4.8  CL 92* 93* 92* 91* 89* 90* 89*  CO2 22 24 23 24 24 25 23   GLUCOSE 257* 165* 103* 138* 160* 151* 170*  BUN 41* 27* 35* 24* 33* 26* 33*  CREATININE 5.42* 4.03* 5.06* 3.74* 4.70* 3.67* 4.79*  CALCIUM  8.7* 8.4* 8.7* 8.4* 8.6* 8.3* 8.7*  PHOS  --   --   --   --  4.7* 4.2 5.3*   CBC Recent Labs  Lab 07/05/24 0928 07/06/24 0543 07/07/24 0850 07/09/24 0523  WBC 6.5 7.0 7.3 7.1  NEUTROABS 5.4  --   --   --   HGB 9.8* 10.2* 10.4* 10.2*  HCT 28.8* 28.9* 30.0* 29.4*  MCV 77.4* 76.9* 78.3* 77.6*  PLT 170 165 176 168    Medications:     amiodarone  200 mg Oral Daily   apixaban   5 mg Oral BID   atorvastatin   80 mg  Oral Daily   Chlorhexidine  Gluconate Cloth  6 each Topical Daily   Chlorhexidine  Gluconate Cloth  6 each Topical Q0600   feeding supplement  237 mL Oral TID BM   guaiFENesin   600 mg Oral BID   insulin  aspart  0-5 Units Subcutaneous QHS   insulin  aspart  0-6 Units Subcutaneous TID WC   insulin  glargine-yfgn  10 Units Subcutaneous Daily   levothyroxine   50 mcg Oral Q0600   midodrine  20 mg Oral Q8H   multivitamin  1 tablet Oral QHS   scopolamine   1 patch Transdermal Q72H   sodium chloride  flush  10-40 mL Intracatheter Q12H   sodium chloride  flush  3 mL Intravenous Q12H   sodium chloride  flush  3 mL Intravenous Q12H      Ephriam Stank, MD [facility] 07/09/2024, 9:17 AM

## 2024-07-09 NOTE — Progress Notes (Signed)
 Physical Therapy Treatment Patient Details Name: [name] MRN: [id number] DOB: [birth year] Today's Date: 07/09/2024   History of Present Illness 73 yo F adm 06/17/24 due to chest pain, SOB with volume overload, acute on chronic CHF. 10/5 DCCV. CRRT 9/30-10/10. 10/11 iHD initiated. PMH: CHF, ICD, CAD, OSA on Bipap, LBBB, HTN, Afib, ICM, T2DM, obesity    PT Comments  Pt pleasant and agreeable to mobility. Pt with dizziness with mobility, fear of falling and benefits from reassurance and encouragement as well as chair follow. Pt with dizziness with some drops in BP but MAP >75 throughout. Pt educated for transfers, gait, HEP and progression. Will continue to follow with OOB to chair encouraged with staff. Patient will benefit from continued inpatient follow up therapy, <3 hours/day   Supine 103/72 (81) Sitting 95/71 (79), HR 103 Standing 114/81 (82), HR 106 Sitting after walking 3' 101/60 (72), HR 101    If plan is discharge home, recommend the following: A lot of help with walking and/or transfers;A lot of help with bathing/dressing/bathroom;Assistance with cooking/housework;Assist for transportation   Can travel by private vehicle     No  Equipment Recommendations  Rolling walker (2 wheels);BSC/3in1;Wheelchair (measurements PT);Wheelchair cushion (measurements PT)    Recommendations for Other Services       Precautions / Restrictions Precautions Precautions: Fall Recall of Precautions/Restrictions: Impaired Precaution/Restrictions Comments: watch BP     Mobility  Bed Mobility Overal bed mobility: Needs Assistance Bed Mobility: Rolling, Sidelying to Sit Rolling: Min assist Sidelying to sit: Min assist       General bed mobility comments: min assist to roll bil for pad positioning, cues and min assist to rise from side to sitting with increased time. Pt with SOB supine and required HOB elevated to 20 degrees to recover SPO2 on 1.5L    Transfers Overall transfer level:  Needs assistance   Transfers: Sit to/from Stand, Bed to chair/wheelchair/BSC Sit to Stand: Mod assist, +2 physical assistance           General transfer comment: mod +2 assist to rise from bed and chair with decreased control of descent despite cues for controlling descent and use of UB. Pt performed 3 trials from chair    Ambulation/Gait Ambulation/Gait assistance: Min assist, +2 safety/equipment Gait Distance (Feet): 8 Feet Assistive device: Rolling walker (2 wheels) Gait Pattern/deviations: Step-through pattern, Decreased stride length, Trunk flexed   Gait velocity interpretation: <1.31 ft/sec, indicative of household ambulator   General Gait Details: pt walked 4' then 8' after seated rest with close chair follow and max cues for encouragement and progression. Pt fearful of falling and benefits from reassurance and encouragement   Stairs             Wheelchair Mobility     Tilt Bed    Modified Rankin (Stroke Patients Only)       Balance Overall balance assessment: Needs assistance Sitting-balance support: No upper extremity supported, Feet supported Sitting balance-Leahy Scale: Good     Standing balance support: Bilateral upper extremity supported, During functional activity, Reliant on assistive device for balance Standing balance-Leahy Scale: Poor Standing balance comment: Rw in standing                            Communication Communication Communication: No apparent difficulties  Cognition Arousal: Alert Behavior During Therapy: WFL for tasks assessed/performed   PT - Cognitive impairments: No apparent impairments  Following commands: Impaired Following commands impaired: Follows one step commands with increased time    Cueing Cueing Techniques: Verbal cues  Exercises General Exercises - Lower Extremity Long Arc Quad: Both, Seated, Strengthening, AROM, 15 reps Hip Flexion/Marching: Both, 10 reps,  Strengthening, AROM, Seated    General Comments        Pertinent Vitals/Pain Pain Assessment Pain Assessment: No/denies pain    Home Living                          Prior Function            PT Goals (current goals can now be found in the care plan section) Progress towards PT goals: Progressing toward goals    Frequency    Min 2X/week      PT Plan      Co-evaluation              AM-PAC PT 6 Clicks Mobility   Outcome Measure  Help needed turning from your back to your side while in a flat bed without using bedrails?: A Little Help needed moving from lying on your back to sitting on the side of a flat bed without using bedrails?: A Lot Help needed moving to and from a bed to a chair (including a wheelchair)?: A Lot Help needed standing up from a chair using your arms (e.g., wheelchair or bedside chair)?: Total Help needed to walk in hospital room?: Total Help needed climbing 3-5 steps with a railing? : Total 6 Click Score: 10    End of Session Equipment Utilized During Treatment: Oxygen;Gait belt Activity Tolerance: Patient tolerated treatment well Patient left: in chair;with call bell/phone within reach;with chair alarm set Nurse Communication: Mobility status PT Visit Diagnosis: Unsteadiness on feet (R26.81);Other abnormalities of gait and mobility (R26.89);Difficulty in walking, not elsewhere classified (R26.2);Muscle weakness (generalized) (M62.81)     Time: 9179-9153 PT Time Calculation (min) (ACUTE ONLY): 26 min  Charges:    $Gait Training: 8-22 mins $Therapeutic Activity: 8-22 mins PT General Charges $$ ACUTE PT VISIT: 1 Visit                     Lenoard SQUIBB, PT Acute Rehabilitation Services Office: [phone]    [name] 07/09/2024, 10:26 AM

## 2024-07-09 NOTE — Progress Notes (Signed)
 Nutrition Follow-up  DOCUMENTATION CODES:  Non-severe (moderate) malnutrition in context of chronic illness  INTERVENTION:  Liberalize diet to regular to provide wider variety of menu options to support oral intake given widely variable appetite and oral intake Continue to offer Ensure Plus High Protein po TID, each supplement provides 350 kcal and 20 grams of protein. Recommend adding scheduled bowel regimen given infrequent BM's and only documented to be small type 2; discussed with MD Continue renal MVI with minerals daily  NUTRITION DIAGNOSIS:  Moderate Malnutrition related to chronic illness as evidenced by mild fat depletion, moderate muscle depletion. - remains applicable  GOAL:  Patient will meet greater than or equal to 90% of their needs - unmet, addressing via meals and nutrition supplements  MONITOR:  PO intake, Diet advancement, Labs, Weight trends  REASON FOR ASSESSMENT:  Consult Assessment of nutrition requirement/status (CRRT)  ASSESSMENT:  73 yo female presents with progress weakness, sternal pressure, shortness of breath and decrease intake for many days and admitted for acute on chronic heart failure with respiratory failure with bilateral pleural effusions and hx of untreated OSA, AKI on CKD. PMH includes CAD, A.Fib,  biventricular HFrE s/p BiVen ICD, CKD 3a, DM  09/28 Admitted 09/30 AKI-worsening, CRRT initiated 10/10 CRRT discontinued 10/11 Tolerated iHD (3.5 hours with 1L net UF)   Awaiting CLIP and SNF placement.   Pt's appetite and meal intake continues to remain widely variable.  Pt with a good appetite this morning however after working with therapy, felt a little nauseous and therefore did not desire to eat breakfast. Intermittently consuming Ensure.   Meal completions: 10/16: 0% breakfast, 15% lunch 10/17: 100% breakfast, 35% lunch 10/19: 75% lunch, 35% dinner 10/20: 75% breakfast, 45% lunch  Last HD session 10/18 Net UF Post HD weight  85.3 kg  Admit weight: 97.8 kg Current weight: 86.6 kg  Medications: SSI 0-5 units at bedtime, SSI 0-6 units TID, semglee  10 units daily, rena-vit  Labs:  Sodium 126 BUN 33 Cr 4.79 Phosphorus 5.3 GFR 9 CBG's 170-207 x24 hours  Diet Order:   Diet Order             Diet regular Room service appropriate? Yes with Assist; Fluid consistency: Thin; Fluid restriction: 1200 mL Fluid  Diet effective now                   EDUCATION NEEDS:   Education needs have been addressed  Skin:  Skin Assessment: Skin Integrity Issues: Skin Integrity Issues:: Stage I Stage I: back d/t LDA cord pt was laying on Other: MASD to labia  Last BM:  10/17 type 2 small  Height:  Ht Readings from Last 1 Encounters:  07/06/24 5' 5 (1.651 m)    Weight:  Wt Readings from Last 1 Encounters:  07/09/24 86.6 kg   BMI:  Body mass index is 31.77 kg/m.  Estimated Nutritional Needs:   Kcal:  1700-1900 kcals  Protein:  100-125 g  Fluid:  1.5L  Allie Brynda Heick, RDN, LDN Clinical Nutrition See AMiON for contact information.

## 2024-07-09 NOTE — Progress Notes (Addendum)
 Contacted by SW/CM many times.   Navigator has not been contacted to provide CLIP for pt out-pt HD clinic, have been monitoring this. Have reached out to nephrology at this time to determine if pt is out-pt HD appropriate and if they will need CLIP. If so, a referral near [facility] snf will need to be completed. Will continue to monitor and assist  Lavanda Holton [facility] [phone]  Addendum 12:41 Navigator has gotten approval to initiate CLIP process. Will look for clinic close to snf.   Addendum 2:22pm CLIP completed, spot reserved for [facility] placement at [facility]  TTS 2nd shift. Clinic will not move fwd with referral until hep b core total lab results. Will await result and provide update when applicable.

## 2024-07-10 DIAGNOSIS — I2510 Atherosclerotic heart disease of native coronary artery without angina pectoris: Secondary | ICD-10-CM | POA: Diagnosis not present

## 2024-07-10 DIAGNOSIS — N1832 Chronic kidney disease, stage 3b: Secondary | ICD-10-CM | POA: Diagnosis not present

## 2024-07-10 DIAGNOSIS — I5023 Acute on chronic systolic (congestive) heart failure: Secondary | ICD-10-CM | POA: Diagnosis not present

## 2024-07-10 DIAGNOSIS — I10 Essential (primary) hypertension: Secondary | ICD-10-CM | POA: Diagnosis not present

## 2024-07-10 LAB — RENAL FUNCTION PANEL
Albumin: 2.4 g/dL — ABNORMAL LOW (ref 3.5–5.0)
Anion gap: 12 (ref 5–15)
BUN: 41 mg/dL — ABNORMAL HIGH (ref 8–23)
CO2: 25 mmol/L (ref 22–32)
Calcium: 8.8 mg/dL — ABNORMAL LOW (ref 8.9–10.3)
Chloride: 89 mmol/L — ABNORMAL LOW (ref 98–111)
Creatinine, Ser: 5.17 mg/dL — ABNORMAL HIGH (ref 0.44–1.00)
GFR, Estimated: 8 mL/min — ABNORMAL LOW (ref >60)
Glucose, Bld: 120 mg/dL — ABNORMAL HIGH (ref 70–99)
Phosphorus: 5.3 mg/dL — ABNORMAL HIGH (ref 2.5–4.6)
Potassium: 4.6 mmol/L (ref 3.5–5.1)
Sodium: 126 mmol/L — ABNORMAL LOW (ref 135–145)

## 2024-07-10 LAB — GLUCOSE, CAPILLARY
Glucose-Capillary: 90 mg/dL (ref 70–99)
Glucose-Capillary: 134 mg/dL — ABNORMAL HIGH (ref 70–99)
Glucose-Capillary: 147 mg/dL — ABNORMAL HIGH (ref 70–99)

## 2024-07-10 LAB — HEPATITIS B CORE ANTIBODY, TOTAL: HEP B CORE AB: NEGATIVE

## 2024-07-10 MED ORDER — MIDODRINE HCL 5 MG PO TABS
ORAL_TABLET | ORAL | Status: AC
  Filled 2024-07-10: qty 2

## 2024-07-10 MED ORDER — MIDODRINE HCL 5 MG PO TABS
10.0000 mg | ORAL_TABLET | Freq: Once | ORAL | Status: AC
Start: 2024-07-10 — End: 2024-07-10
  Administered 2024-07-10: 10 mg via ORAL

## 2024-07-10 MED ORDER — MIDODRINE HCL 5 MG PO TABS
20.0000 mg | ORAL_TABLET | ORAL | Status: AC
  Administered 2024-07-10: 20 mg via ORAL
  Filled 2024-07-10: qty 4

## 2024-07-10 MED ORDER — HEPARIN SODIUM (PORCINE) 1000 UNIT/ML IJ SOLN
INTRAMUSCULAR | Status: AC
  Filled 2024-07-10: qty 4

## 2024-07-10 NOTE — TOC Progression Note (Signed)
 Transition of Care [facility]) - Progression Note    Patient Details  Name: [name] MRN: [id number] Date of Birth: 12/31/1950  [facility] CM/SW Contact  [name], [place] Phone Number: 07/10/2024, 2:24 PM  Clinical Narrative:   Per Stephens with admissions at [facility], they have bed availability for patient as early as tomorrow. CSW updated patients son, [name]. [name] stated he wants his mother to go to [facility]. CSW notified [facility] and [facility] of change in choice.   CSW notified treatment team. CSW called Navi with [employer] to update insurance authorization. Shara is now good from 10/21-10/23, id [id number].   CSW will continue to follow.    Expected Discharge Plan: Skilled Nursing Facility Barriers to Discharge: Continued Medical Work up               Expected Discharge Plan and Services   Discharge Planning Services: CM Consult Post Acute Care Choice: Skilled Nursing Facility Living arrangements for the past 2 months: Single Family Home                                       Social Drivers of Health (SDOH) Interventions SDOH Screenings   Food Insecurity: No Food Insecurity (06/18/2024)  Recent Concern: Food Insecurity - Food Insecurity Present (04/23/2024)  Housing: Low Risk  (06/18/2024)  Transportation Needs: No Transportation Needs (06/18/2024)  Utilities: Not At Risk (06/18/2024)  Alcohol Screen: Low Risk  (03/21/2024)  Depression (PHQ2-9): Low Risk  (04/23/2024)  Financial Resource Strain: Low Risk  (03/21/2024)  Physical Activity: Sufficiently Active (03/21/2024)  Social Connections: Moderately Integrated (06/18/2024)  Stress: No Stress Concern Present (03/21/2024)  Tobacco Use: Medium Risk (06/17/2024)  Health Literacy: Adequate Health Literacy (03/21/2024)    Readmission Risk Interventions     No data to display

## 2024-07-10 NOTE — Progress Notes (Addendum)
 CLIP in process to [name], last step completed this morning, sent in hep b core lab, now awaiting clinical clearance, will provide update when available.   [name] [phone]  Addendum 2:07pm Requested update on referral status, no feedback provided at this time, will continue trying.   Addendum 2:31pm patient has been accepted at [facility] with a start date of 10/23 at 9:30AM for paperwork and TTS 10:30AM ongoing. Notified SW/CM, attending, RN, and nephrologist at this time. Will add info to AVS and continue to assist as needed.

## 2024-07-10 NOTE — Progress Notes (Signed)
 Patient with irregular rhythm following working with occupational therapy this afternoon. MD notified.

## 2024-07-10 NOTE — Progress Notes (Signed)
 Occupational Therapy Treatment Patient Details Name: [name] MRN: [id number] DOB: Sep 30, 1950 Today's Date: 07/10/2024   History of present illness 73 yo F adm 06/17/24 due to chest pain, SOB with volume overload, acute on chronic CHF. 10/5 DCCV. CRRT 9/30-10/10. 10/11 iHD initiated. PMH: CHF, ICD, CAD, OSA on Bipap, LBBB, HTN, Afib, ICM, T2DM, obesity   OT comments  Pt seen for OT tx session, goals downgraded as pt with decr activity tolerance. Pt needs min-mod A for bed mobility and sits EOB x10 min for seated therex, mod A +2 for standing. Pt with dizziness and mild SOB, noted runs of vtach on monitor and RN present to assist with returning to supine. Pt presenting with impairments listed below, will follow acutely. Patient will benefit from continued inpatient follow up therapy, <3 hours/day.  BP seated EOB 94/61 (72) BP supine after session 75/44 (56) BP supine x5 min 83/73 (79) SpO2 97% 2L O2       If plan is discharge home, recommend the following:  A little help with walking and/or transfers;A lot of help with bathing/dressing/bathroom;Assistance with cooking/housework;Assist for transportation;Help with stairs or ramp for entrance   Equipment Recommendations  BSC/3in1    Recommendations for Other Services PT consult    Precautions / Restrictions Precautions Precautions: Fall Recall of Precautions/Restrictions: Impaired Precaution/Restrictions Comments: watch BP Restrictions Weight Bearing Restrictions Per Provider Order: No       Mobility Bed Mobility Overal bed mobility: Needs Assistance Bed Mobility: Rolling, Sidelying to Sit   Sidelying to sit: Min assist     Sit to sidelying: Mod assist      Transfers Overall transfer level: Needs assistance Equipment used: 2 person hand held assist Transfers: Sit to/from Stand Sit to Stand: Mod assist, +2 physical assistance                 Balance Overall balance assessment: Needs  assistance Sitting-balance support: No upper extremity supported, Feet supported Sitting balance-Leahy Scale: Good     Standing balance support: Bilateral upper extremity supported, During functional activity, Reliant on assistive device for balance Standing balance-Leahy Scale: Poor                             ADL either performed or assessed with clinical judgement   ADL Overall ADL's : Needs assistance/impaired                         Toilet Transfer: Moderate assistance;+2 for physical assistance           Functional mobility during ADLs: Moderate assistance;+2 for physical assistance      Extremity/Trunk Assessment Upper Extremity Assessment Upper Extremity Assessment: Generalized weakness   Lower Extremity Assessment Lower Extremity Assessment: Defer to PT evaluation        Vision   Vision Assessment?: No apparent visual deficits   Perception Perception Perception: Not tested   Praxis Praxis Praxis: Not tested   Communication Communication Communication: No apparent difficulties   Cognition Arousal: Alert Behavior During Therapy: Flat affect Cognition: No apparent impairments                               Following commands: Impaired Following commands impaired: Follows one step commands with increased time      Cueing   Cueing Techniques: Verbal cues  Exercises Other Exercises Other Exercises: seated heel raises  x10 Other Exercises: seated shoulder shrugs x10 Other Exercises: seated functional reach x10    Shoulder Instructions       General Comments tele alarm with vtach runs during session, RN present and aware, BP soft, see not for BP mreasures    Pertinent Vitals/ Pain       Pain Assessment Pain Assessment: Faces Pain Score: 4  Faces Pain Scale: Hurts little more Pain Location: back/bottom Pain Descriptors / Indicators: Discomfort Pain Intervention(s): Limited activity within patient's tolerance,  Monitored during session, Repositioned  Home Living                                          Prior Functioning/Environment              Frequency  Min 2X/week        Progress Toward Goals  OT Goals(current goals can now be found in the care plan section)  Progress towards OT goals: Goals drowngraded-see care plan  Acute Rehab OT Goals Patient Stated Goal: none stated OT Goal Formulation: With patient Time For Goal Achievement: 07/24/24 Potential to Achieve Goals: Good ADL Goals Pt Will Perform Grooming: with supervision;sitting Pt Will Perform Upper Body Dressing: with min assist;sitting;standing Pt Will Perform Lower Body Dressing: with min assist;sitting/lateral leans;sit to/from stand Pt Will Transfer to Toilet: with min assist;stand pivot transfer;bedside commode Pt/caregiver will Perform Home Exercise Program: Increased ROM;Increased strength;Both right and left upper extremity;With Supervision;With written HEP provided Additional ADL Goal #1: Pt will tolerate seated functional activity x15 min in order to improve activity tolerance for ADLs  Plan      Co-evaluation                 AM-PAC OT 6 Clicks Daily Activity     Outcome Measure   Help from another person eating meals?: A Little Help from another person taking care of personal grooming?: A Little Help from another person toileting, which includes using toliet, bedpan, or urinal?: A Lot Help from another person bathing (including washing, rinsing, drying)?: A Lot Help from another person to put on and taking off regular upper body clothing?: A Lot Help from another person to put on and taking off regular lower body clothing?: A Lot 6 Click Score: 14    End of Session Equipment Utilized During Treatment: Oxygen (2L)  OT Visit Diagnosis: Unsteadiness on feet (R26.81);Other abnormalities of gait and mobility (R26.89);Muscle weakness (generalized) (M62.81)   Activity Tolerance  Patient limited by fatigue   Patient Left in bed;with call bell/phone within reach;with bed alarm set   Nurse Communication Mobility status (tele alarm with vtach on monitor)        Time: 8540-8474 OT Time Calculation (min): 26 min  Charges: OT General Charges $OT Visit: 1 Visit OT Treatments $Therapeutic Activity: 8-22 mins $Therapeutic Exercise: 8-22 mins  Lolah Coghlan K, OTD, OTR/L [facility] [phone]   Laneta POUR Koonce 07/10/2024, 4:54 PM

## 2024-07-10 NOTE — Procedures (Signed)
 I was present at this dialysis session. I have reviewed the session itself and made appropriate changes.   Filed Weights   07/08/24 2300 07/09/24 0433 07/10/24 0030  Weight: 83.8 kg 86.6 kg 86.7 kg    Recent Labs  Lab 07/10/24 0309  NA 126*  K 4.6  CL 89*  CO2 25  GLUCOSE 120*  BUN 41*  CREATININE 5.17*  CALCIUM  8.8*  PHOS 5.3*    Recent Labs  Lab 07/05/24 0928 07/06/24 0543 07/07/24 0850 07/09/24 0523  WBC 6.5 7.0 7.3 7.1  NEUTROABS 5.4  --   --   --   HGB 9.8* 10.2* 10.4* 10.2*  HCT 28.8* 28.9* 30.0* 29.4*  MCV 77.4* 76.9* 78.3* 77.6*  PLT 170 165 176 168    Scheduled Meds:  amiodarone  200 mg Oral Daily   apixaban   5 mg Oral BID   atorvastatin   80 mg Oral Daily   Chlorhexidine  Gluconate Cloth  6 each Topical Daily   Chlorhexidine  Gluconate Cloth  6 each Topical Q0600   feeding supplement  237 mL Oral TID BM   guaiFENesin   600 mg Oral BID   insulin  aspart  0-5 Units Subcutaneous QHS   insulin  aspart  0-6 Units Subcutaneous TID WC   insulin  glargine-yfgn  10 Units Subcutaneous Daily   levothyroxine   50 mcg Oral Q0600   midodrine  20 mg Oral Q8H   multivitamin  1 tablet Oral QHS   polyethylene glycol  17 g Oral BID   scopolamine   1 patch Transdermal Q72H   sodium chloride  flush  10-40 mL Intracatheter Q12H   sodium chloride  flush  3 mL Intravenous Q12H   sodium chloride  flush  3 mL Intravenous Q12H   Continuous Infusions: PRN Meds:.acetaminophen  **OR** acetaminophen , albuterol , bisacodyl, calcium  carbonate, Gerhardt's butt cream, guaiFENesin -dextromethorphan , loperamide, ondansetron  **OR** ondansetron  (ZOFRAN ) IV, mouth rinse, senna-docusate, sodium chloride  flush, sodium chloride  flush, traMADol , trimethobenzamide   Ephriam Stank, MD [facility] 07/10/2024, 8:55 AM

## 2024-07-10 NOTE — Procedures (Signed)
 Received patient in bed to unit.  Alert and oriented.  Informed consent signed and in chart.   TX duration:3hrs  Patient tolerated well.  Transported back to the room  Alert, without acute distress.  Hand-off given to patient's nurse.   Access used: R chest catheter Access issues: none  Total UF removed: 1.4L Medication(s) given: Midodrine 10mg  Post HD weight: 85.3kg Post HD VS: 96/64   Jacee Enerson [facility]

## 2024-07-10 NOTE — Progress Notes (Signed)
 [facility] Progress Note    Assessment/ Plan:   Anuric AKI (not improving) on CKD3b: Likely secondary to new RV heart failure, persistent atrial fibrillation, Cardiorenal Syndrome  -failed high dose diuretic challenge and CRRT initiated mainly for volume  Continued CRRT: 9/30-10/10. S/p TDC placement with IR on 10/15 -no evidence of renal recovery at this junction -HD on TTS schedule for now pending course, will attempt HD in recliner -continue midodrine, will try avoid albumin -Avoid nephrotoxic medications including NSAIDs and iodinated intravenous contrast exposure unless the latter is absolutely indicated.  Preferred narcotic agents for pain control are hydromorphone , fentanyl , and methadone. Morphine  should not be used. Avoid Baclofen and avoid oral sodium phosphate and magnesium  citrate based laxatives / bowel preps. Continue strict Input and Output monitoring. Will monitor the patient closely with you and intervene or adjust therapy as indicated by changes in clinical status/labs    Hyponatremia: Mild, stable.  Will attempt to manage with HD/UF   AoC BiV HF; New onset RV failure: pressors and inotropes weaned, on midodrine 20 TID now.  D/w cardiology in the past- think additional cardiac improvement is unlikely.  At this point continue current care but in the coming days will need to see if she's overall strong enough to continue with dialysis and current therapies.     Atrial fibrillation: S/p cardioversion 10/5.  Remains in sinus rhythm.  On po amiodarone now.   CAD: On atorvastatin    Deconditioning - continue with PT/OT.  Awaiting SNF placement  Christina Stank, MD  [facility]  Subjective:   Patient seen and examined on dialysis, no complaints, tolerating treatment   Objective:   BP 101/77   Pulse 84   Temp 98 F (36.7 C)   Resp 15   Ht 5' 5 (1.651 m)   Wt 86.7 kg   SpO2 100%   BMI 31.81 kg/m   Intake/Output Summary (Last 24 hours) at  07/10/2024 0855 Last data filed at 07/09/2024 1300 Gross per 24 hour  Intake 220 ml  Output --  Net 220 ml   Weight change: 2.9 kg  Physical Exam: Gen: NAD CVS: RRR Resp: CTA BL Abd: soft Ext: trace edema b/l Les Neuro: awake, alert, participating with PT Dialysis access: RIJ TDC c/d/i  Imaging: No results found.  Labs: BMET Recent Labs  Lab 07/04/24 0540 07/05/24 0445 07/06/24 0543 07/07/24 0505 07/08/24 0525 07/09/24 0523 07/10/24 0309  NA 128* 129* 128* 128* 128* 126* 126*  K 4.4 5.1 4.1 4.3 4.3 4.8 4.6  CL 93* 92* 91* 89* 90* 89* 89*  CO2 24 23 24 24 25 23 25   GLUCOSE 165* 103* 138* 160* 151* 170* 120*  BUN 27* 35* 24* 33* 26* 33* 41*  CREATININE 4.03* 5.06* 3.74* 4.70* 3.67* 4.79* 5.17*  CALCIUM  8.4* 8.7* 8.4* 8.6* 8.3* 8.7* 8.8*  PHOS  --   --   --  4.7* 4.2 5.3* 5.3*   CBC Recent Labs  Lab 07/05/24 0928 07/06/24 0543 07/07/24 0850 07/09/24 0523  WBC 6.5 7.0 7.3 7.1  NEUTROABS 5.4  --   --   --   HGB 9.8* 10.2* 10.4* 10.2*  HCT 28.8* 28.9* 30.0* 29.4*  MCV 77.4* 76.9* 78.3* 77.6*  PLT 170 165 176 168    Medications:     amiodarone  200 mg Oral Daily   apixaban   5 mg Oral BID   atorvastatin   80 mg Oral Daily   Chlorhexidine  Gluconate Cloth  6 each Topical Daily  Chlorhexidine  Gluconate Cloth  6 each Topical Q0600   feeding supplement  237 mL Oral TID BM   guaiFENesin   600 mg Oral BID   insulin  aspart  0-5 Units Subcutaneous QHS   insulin  aspart  0-6 Units Subcutaneous TID WC   insulin  glargine-yfgn  10 Units Subcutaneous Daily   levothyroxine   50 mcg Oral Q0600   midodrine  20 mg Oral Q8H   multivitamin  1 tablet Oral QHS   polyethylene glycol  17 g Oral BID   scopolamine   1 patch Transdermal Q72H   sodium chloride  flush  10-40 mL Intracatheter Q12H   sodium chloride  flush  3 mL Intravenous Q12H   sodium chloride  flush  3 mL Intravenous Q12H      Christina Stank, MD [facility] 07/10/2024, 8:55 AM

## 2024-07-10 NOTE — Progress Notes (Signed)
 OT Cancellation Note  Patient Details Name: [name] MRN: [id number] DOB: September 27, 1950   Cancelled Treatment:    Reason Eval/Treat Not Completed: Patient at procedure or test/ unavailable (HD)  Faduma Cho K, OTD, OTR/L SecureChat Preferred Acute Rehab [phone]   [name] 07/10/2024, 9:48 AM

## 2024-07-10 NOTE — Progress Notes (Signed)
 Progress Note   Patient: [name] FMW:[id number] DOB: December 07, 1950 DOA: 06/17/2024     23 DOS: the patient was seen and examined on 07/10/2024   Brief hospital course: 73 y.o. with history of combined systolic and diastolic heart failure s/p BiV ICD, CAD, OSA on bipap, LBBB, HTN, hypotension, and atrial fibrillation.  - Admitted with volume overload, AKI - Echo 9/30 noted EF 20-25%, severe MR, moderately reduced RV - RHC 9/30> elevated PA pressures and filling pressures, PA PI of 1.6 - Treated with inotropes and vasopressors in ICU, now off - Complicated by worsening renal failure, cardiorenal syndrome, failed high-dose diuretic challenge, then started on CRRT from 9/30-10/10 -sp cardioversion for A-fib -Palliative care consulted and following - 10/11 initiated hemodialysis - Transferred out of ICU to [facility] service 10/14  10/20 pending HD and SNF arrangement  10/21 tolerating well HD.   Assessment and Plan: * Acute on chronic systolic CHF (congestive heart failure) (HCC) Echocardiogram with reduced LV systolic function, EF 20 to 25%, global hypokinesis, moderate dilatation of LV cavity, moderate LVH, interventricular septum flattened in systole, RV systolic function with moderate reduction, RVSP 38.0 mmHg, LA with moderate dilatation, severe mitral and tricuspid regurgitation,   Biventricular failure  Acute on chronic cor pulmonale  09/30 cardiac catheterization  RA 17  RV 53/17 PA 63/35 mean 46  PCWP 40  Cardiac output 5.7 index 2.8 (Fick) Cardiac output 3,7 index 1.8 (thermodilution) PVR 1.6 Wood units  Patient was placed on inotropic support for low output state/ cardiogenic shock, IV diuresis and vasopressors.   Required CRRT for worsening renal failure, now on intermittent HD. Limited medical therapy due to hypotension Currently on midodrine for blood pressure support.    Acute hypoxemic respiratory failure due to acute cardiogenic pulmonary edema and volume  overload Clinically improving with ultrafiltration  Today 02 saturation 96% on 2 l/min   Chronic kidney disease, stage 3b (HCC) AKI oliguric, ATN, likely progress to end stage renal disease,  Hyponatremia   Patient was placed on CRRT on 09/30 for volume overload, not responding to diuretic therapy.  10/11 transitioned to intermittent HD Blood pressure support with midodrine.  10/15 right internal jugular vein temporal dialysis catheter (tunneled) per IR   Pre HD Na 126, BUN 41 and K 4.6 with serum bicarbonate at 25  P 5.3  HD today   Continue renal replacement therapy per Nephrology recommendations.   Coronary artery disease No chest pain, no acute coronary syndrome   Paroxysmal atrial fibrillation (HCC) 10/05 Sp TEE cardioversion. Plan to continue with amiodarone for rate and rhythm control Anticoagulation with apixaban   Essential hypertension Continue blood pressure support with midodrine   Type 2 diabetes mellitus with hyperlipidemia (HCC) Continue insulin  sliding scale for glucose cover and monitoring Basal insulin   Fasting glucose today 120 mg/dl   Hypothyroidism Continue levothyroxine    Malnutrition of moderate degree Continue with nutritional supplements   OSA (obstructive sleep apnea) Cpap Obesity class 1 with calculated BMI 31.5   Pressure injury of skin Continue local wound care  Chronic anemia Cell count has been stable        Subjective: patient tolerating well HD, with no further chest pain, dyspnea and edema are improving   Physical Exam: Vitals:   07/10/24 0800 07/10/24 0830 07/10/24 0900 07/10/24 0930  BP: 105/68 101/77 100/76 105/65  Pulse: 84 84 82 80  Resp: 14 15 12 13   Temp:      TempSrc:      SpO2:  100%  Weight:      Height:       Neurology awake and alert ENT with mild pallor Cardiovascular with S1 and S2 present and regular with no gallops or rubs Respiratory with no rales or wheezing no rhonchi Abdomen with no  distention  Lower extremity edema +  Data Reviewed:    Family Communication: no family at the bedside   Disposition: Status is: Inpatient Remains inpatient appropriate because: pending final nephrology recommendations and transfer to SNF   Planned Discharge Destination: Skilled nursing facility    Author: Elidia Toribio Furnace, MD 07/10/2024 10:38 AM  For on call review [email or web address].

## 2024-07-11 DIAGNOSIS — I5082 Biventricular heart failure: Secondary | ICD-10-CM | POA: Diagnosis not present

## 2024-07-11 DIAGNOSIS — I5023 Acute on chronic systolic (congestive) heart failure: Secondary | ICD-10-CM | POA: Diagnosis not present

## 2024-07-11 DIAGNOSIS — R570 Cardiogenic shock: Secondary | ICD-10-CM | POA: Diagnosis not present

## 2024-07-11 LAB — RENAL FUNCTION PANEL
Albumin: 2.4 g/dL — ABNORMAL LOW (ref 3.5–5.0)
Albumin: 2.2 g/dL — ABNORMAL LOW (ref 3.5–5.0)
Anion gap: 12 (ref 5–15)
Anion gap: 16 — ABNORMAL HIGH (ref 5–15)
BUN: 32 mg/dL — ABNORMAL HIGH (ref 8–23)
BUN: 31 mg/dL — ABNORMAL HIGH (ref 8–23)
CO2: 25 mmol/L (ref 22–32)
CO2: 21 mmol/L — ABNORMAL LOW (ref 22–32)
Calcium: 8.2 mg/dL — ABNORMAL LOW (ref 8.9–10.3)
Calcium: 7.2 mg/dL — ABNORMAL LOW (ref 8.9–10.3)
Chloride: 89 mmol/L — ABNORMAL LOW (ref 98–111)
Chloride: 81 mmol/L — ABNORMAL LOW (ref 98–111)
Creatinine, Ser: 4.15 mg/dL — ABNORMAL HIGH (ref 0.44–1.00)
Creatinine, Ser: 3.91 mg/dL — ABNORMAL HIGH (ref 0.44–1.00)
GFR, Estimated: 11 mL/min — ABNORMAL LOW (ref >60)
GFR, Estimated: 11 mL/min — ABNORMAL LOW (ref >60)
Glucose, Bld: 175 mg/dL — ABNORMAL HIGH (ref 70–99)
Glucose, Bld: 437 mg/dL — ABNORMAL HIGH (ref 70–99)
Phosphorus: 3.7 mg/dL (ref 2.5–4.6)
Phosphorus: 3.2 mg/dL (ref 2.5–4.6)
Potassium: 4.3 mmol/L (ref 3.5–5.1)
Potassium: 3.9 mmol/L (ref 3.5–5.1)
Sodium: 126 mmol/L — ABNORMAL LOW (ref 135–145)
Sodium: 118 mmol/L — CL (ref 135–145)

## 2024-07-11 LAB — COMPREHENSIVE METABOLIC PANEL WITH GFR
ALT: <5 U/L (ref 0–44)
AST: 16 U/L (ref 15–41)
Albumin: 2.4 g/dL — ABNORMAL LOW (ref 3.5–5.0)
Alkaline Phosphatase: 50 U/L (ref 38–126)
Anion gap: 14 (ref 5–15)
BUN: 29 mg/dL — ABNORMAL HIGH (ref 8–23)
CO2: 23 mmol/L (ref 22–32)
Calcium: 7.8 mg/dL — ABNORMAL LOW (ref 8.9–10.3)
Chloride: 83 mmol/L — ABNORMAL LOW (ref 98–111)
Creatinine, Ser: 3.69 mg/dL — ABNORMAL HIGH (ref 0.44–1.00)
GFR, Estimated: 12 mL/min — ABNORMAL LOW (ref >60)
Glucose, Bld: 423 mg/dL — ABNORMAL HIGH (ref 70–99)
Potassium: 3.9 mmol/L (ref 3.5–5.1)
Sodium: 120 mmol/L — ABNORMAL LOW (ref 135–145)
Total Bilirubin: 1.1 mg/dL (ref 0.0–1.2)
Total Protein: 6.3 g/dL — ABNORMAL LOW (ref 6.5–8.1)

## 2024-07-11 LAB — GLUCOSE, CAPILLARY
Glucose-Capillary: 174 mg/dL — ABNORMAL HIGH (ref 70–99)
Glucose-Capillary: 140 mg/dL — ABNORMAL HIGH (ref 70–99)
Glucose-Capillary: 116 mg/dL — ABNORMAL HIGH (ref 70–99)
Glucose-Capillary: 197 mg/dL — ABNORMAL HIGH (ref 70–99)
Glucose-Capillary: 211 mg/dL — ABNORMAL HIGH (ref 70–99)
Glucose-Capillary: 163 mg/dL — ABNORMAL HIGH (ref 70–99)
Glucose-Capillary: 433 mg/dL — ABNORMAL HIGH (ref 70–99)
Glucose-Capillary: 371 mg/dL — ABNORMAL HIGH (ref 70–99)
Glucose-Capillary: 134 mg/dL — ABNORMAL HIGH (ref 70–99)

## 2024-07-11 LAB — COOXEMETRY PANEL
Carboxyhemoglobin: 1.6 % — ABNORMAL HIGH (ref 0.5–1.5)
Methemoglobin: <0.7 % (ref 0.0–1.5)
O2 Saturation: 48.8 %
Total hemoglobin: 11.2 g/dL — ABNORMAL LOW (ref 12.0–16.0)

## 2024-07-11 LAB — LACTIC ACID, PLASMA: Lactic Acid, Venous: 0.9 mmol/L (ref 0.5–1.9)

## 2024-07-11 MED ORDER — NOREPINEPHRINE 4 MG/250ML-% IV SOLN
0.0000 ug/min | INTRAVENOUS | Status: DC
  Administered 2024-07-11: 2 ug/min via INTRAVENOUS
  Filled 2024-07-11: qty 250

## 2024-07-11 MED ORDER — AMIODARONE HCL IN DEXTROSE 360-4.14 MG/200ML-% IV SOLN: 30.0000 mg/h | INTRAVENOUS | Status: DC

## 2024-07-11 MED ORDER — PRISMASOL BGK 4/2.5 32-4-2.5 MEQ/L EC SOLN: Status: DC

## 2024-07-11 MED ORDER — NOREPINEPHRINE 4 MG/250ML-% IV SOLN
0.0000 ug/min | INTRAVENOUS | Status: DC
  Administered 2024-07-11: 16 ug/min via INTRAVENOUS
  Administered 2024-07-11: 11 ug/min via INTRAVENOUS
  Administered 2024-07-11: 10 ug/min via INTRAVENOUS
  Administered 2024-07-12: 16 ug/min via INTRAVENOUS
  Filled 2024-07-11 (×5): qty 250

## 2024-07-11 MED ORDER — AMIODARONE HCL IN DEXTROSE 360-4.14 MG/200ML-% IV SOLN
30.0000 mg/h | INTRAVENOUS | Status: DC
  Administered 2024-07-12 – 2024-07-16 (×11): 30 mg/h via INTRAVENOUS
  Filled 2024-07-11 (×11): qty 200

## 2024-07-11 MED ORDER — AMIODARONE LOAD VIA INFUSION
150.0000 mg | Freq: Once | INTRAVENOUS | Status: DC
  Filled 2024-07-11: qty 83.34

## 2024-07-11 MED ORDER — HEPARIN SODIUM (PORCINE) 1000 UNIT/ML DIALYSIS
1000.0000 [IU] | INTRAMUSCULAR | Status: DC | PRN
  Filled 2024-07-11: qty 3

## 2024-07-11 MED ORDER — AMIODARONE HCL IN DEXTROSE 360-4.14 MG/200ML-% IV SOLN
60.0000 mg/h | INTRAVENOUS | Status: AC
  Administered 2024-07-11: 60 mg/h via INTRAVENOUS
  Filled 2024-07-11: qty 200

## 2024-07-11 MED ORDER — AMIODARONE LOAD VIA INFUSION
150.0000 mg | Freq: Once | INTRAVENOUS | Status: AC
  Administered 2024-07-11: 150 mg via INTRAVENOUS
  Filled 2024-07-11: qty 83.34

## 2024-07-11 MED ORDER — DIGOXIN 125 MCG PO TABS: 0.1250 mg | ORAL_TABLET | Freq: Once | ORAL | Status: DC

## 2024-07-11 MED ORDER — AMIODARONE HCL IN DEXTROSE 360-4.14 MG/200ML-% IV SOLN
60.0000 mg/h | INTRAVENOUS | Status: DC
  Filled 2024-07-11: qty 200

## 2024-07-11 MED ORDER — PRISMASOL BGK 2/3.5 32-2-3.5 MEQ/L EC SOLN: Status: DC

## 2024-07-11 MED ORDER — INSULIN ASPART 100 UNIT/ML IJ SOLN
4.0000 [IU] | Freq: Once | INTRAMUSCULAR | Status: AC
  Administered 2024-07-11: 4 [IU] via SUBCUTANEOUS

## 2024-07-11 NOTE — Progress Notes (Addendum)
 informed by SW that pt not medically stable for d/c today. Being that her first HD out-pt appt was set for tomorrow, I have called [facility],  and gotten confirmation that she can start Saturday instead, pending d/c. Will update AVS at this time and continue to assist as needed.   Fishel Wamble [facility] [phone]

## 2024-07-11 NOTE — Progress Notes (Addendum)
 [facility] Progress Note    Assessment/ Plan:   Anuric AKI (not improving) on CKD3b: Likely secondary to new RV heart failure, persistent atrial fibrillation, Cardiorenal Syndrome  -failed high dose diuretic challenge and CRRT initiated mainly for volume  Continued CRRT: 9/30-10/10. S/p TDC placement with IR on 10/15 -no evidence of renal recovery at this junction -HD on TTS schedule w/ midodrine support -CLIP: accepted to [facility] @ 9:30am, start 10/23 -continue midodrine, will try avoid albumin -Avoid nephrotoxic medications including NSAIDs and iodinated intravenous contrast exposure unless the latter is absolutely indicated.  Preferred narcotic agents for pain control are hydromorphone , fentanyl , and methadone. Morphine  should not be used. Avoid Baclofen and avoid oral sodium phosphate and magnesium  citrate based laxatives / bowel preps. Continue strict Input and Output monitoring. Will monitor the patient closely with you and intervene or adjust therapy as indicated by changes in clinical status/labs    Hyponatremia: stable-126 today.  Will attempt to manage with HD/UF   AoC BiV HF; New onset RV failure: pressors and inotropes weaned, on midodrine 20 TID now.  D/w cardiology in the past- think additional cardiac improvement is unlikely.  Will continue to UF as tolerated with HD   Atrial fibrillation: S/p cardioversion 10/5.  Remains in sinus rhythm.  On po amiodarone now.   CAD: On atorvastatin    Deconditioning - continue with PT/OT.  Awaiting SNF  Christina Stank, MD [facility]  Addendum: Change in status since this AM. Discussed with AHF. Patient transferred to [facility] ICU, evaluated around 2:15pm. Tachypneic, unstable afib w/ RVR requiring NE. CVP >20. At this junction will start CRRT to help off load volume as much as possible. Patient in agreement. Discussed with ICU RN. -UFG net neg 100-150cc/hr as hemodynamics allow -bags: pre and post  4k, dialysate 2k -access: RIJ TDC -A/C: none for now  Subjective:   Patient seen and examined bedside, no complaints. She reports that she tolerated HD yesterday with net uf 1.4L   Objective:   BP (!) 88/68 (BP Location: Left Arm)   Pulse (!) 125   Temp 97.8 F (36.6 C) (Oral)   Resp 19   Ht 5' 5 (1.651 m)   Wt 85.3 kg   SpO2 100%   BMI 31.29 kg/m   Intake/Output Summary (Last 24 hours) at 07/11/2024 0908 Last data filed at 07/10/2024 1700 Gross per 24 hour  Intake 50 ml  Output 1400 ml  Net -1350 ml   Weight change: -1.4 kg  Physical Exam: Gen: NAD, eating breakfast CVS: RRR Resp: CTA BL Abd: soft Ext: trace ankle edema b/l Les Neuro: awake, alert Dialysis access: RIJ TDC c/d/i  Imaging: No results found.  Labs: BMET Recent Labs  Lab 07/05/24 0445 07/06/24 0543 07/07/24 0505 07/08/24 0525 07/09/24 0523 07/10/24 0309 07/11/24 0235  NA 129* 128* 128* 128* 126* 126* 126*  K 5.1 4.1 4.3 4.3 4.8 4.6 4.3  CL 92* 91* 89* 90* 89* 89* 89*  CO2 23 24 24 25 23 25 25   GLUCOSE 103* 138* 160* 151* 170* 120* 175*  BUN 35* 24* 33* 26* 33* 41* 32*  CREATININE 5.06* 3.74* 4.70* 3.67* 4.79* 5.17* 4.15*  CALCIUM  8.7* 8.4* 8.6* 8.3* 8.7* 8.8* 8.2*  PHOS  --   --  4.7* 4.2 5.3* 5.3* 3.7   CBC Recent Labs  Lab 07/05/24 0928 07/06/24 0543 07/07/24 0850 07/09/24 0523  WBC 6.5 7.0 7.3 7.1  NEUTROABS 5.4  --   --   --  HGB 9.8* 10.2* 10.4* 10.2*  HCT 28.8* 28.9* 30.0* 29.4*  MCV 77.4* 76.9* 78.3* 77.6*  PLT 170 165 176 168    Medications:     amiodarone  200 mg Oral Daily   apixaban   5 mg Oral BID   atorvastatin   80 mg Oral Daily   Chlorhexidine  Gluconate Cloth  6 each Topical Daily   Chlorhexidine  Gluconate Cloth  6 each Topical Q0600   feeding supplement  237 mL Oral TID BM   guaiFENesin   600 mg Oral BID   insulin  aspart  0-5 Units Subcutaneous QHS   insulin  aspart  0-6 Units Subcutaneous TID WC   insulin  glargine-yfgn  10 Units Subcutaneous Daily    levothyroxine   50 mcg Oral Q0600   midodrine  20 mg Oral Q8H   multivitamin  1 tablet Oral QHS   polyethylene glycol  17 g Oral BID   scopolamine   1 patch Transdermal Q72H   sodium chloride  flush  10-40 mL Intracatheter Q12H   sodium chloride  flush  3 mL Intravenous Q12H   sodium chloride  flush  3 mL Intravenous Q12H      Christina Stank, MD [facility] 07/11/2024, 9:08 AM

## 2024-07-11 NOTE — Progress Notes (Signed)
 Patient was on afib with HR 120-130s, tacypneic with abdominal breathing, blood pressure was on soft side since couple hours, she denied any chest pain/ discomfort, O2 increased to 3 liter via [place]. Amiodarone bolus and infusion started as per cardiology, then, her blood pressure started trending down so Rapid RN notified and and Levophed  started at the bedside and transferred to [facility], report given to the bedside RN, all the belongings transferred , family was at bedside.

## 2024-07-11 NOTE — Progress Notes (Signed)
 PROGRESS NOTE    [name]  FMW:[id number] DOB: [birth year] DOA: 06/17/2024 PCP: Tobie Suzzane POUR, MD  73 y.o. with history of combined systolic and diastolic heart failure s/p BiV ICD, CAD, OSA on bipap, LBBB, HTN, hypotension, and atrial fibrillation.  - Admitted with volume overload, AKI - Echo 9/30 noted EF 20-25%, severe MR, moderately reduced RV - RHC 9/30> elevated PA pressures and filling pressures, PA PI of 1.6 - Treated with inotropes and vasopressors in ICU, now off - Complicated by worsening renal failure, cardiorenal syndrome, failed high-dose diuretic challenge, then started on CRRT from 9/30-10/10 -sp cardioversion for A-fib -Palliative care consulted and following - 10/11 initiated hemodialysis - Transferred out of ICU to [facility] service 10/14 -10/15: Tunneled dialysis catheter placed in IR - Multiple palliative care conversations, full code and full scope - CLIP for HD completed : Accepted to [facility]TS - 10/21 PM noted to be back in A-fib  Subjective: - Back in A-fib yesterday evening, remains tachycardic, BP soft, reports some dyspnea  Assessment and Plan:  Acute on chronic biventricular failure Cardiogenic shock -Admitted with cardiogenic shock, worsening renal failure and A-fib - Echo with EF 2025, moderately reduced RV, severe mitral regurgitation -RHC 9/30 noted elevated PA pressures wedge of 40, PA PI 1.6 - Treated with vasopressors, inotropes and high-dose diuretics - Underwent TEE/DCCV - With worsening renal failure started CRRT and now intermittent hemodialysis since 10/11 - Continue high-dose midodrine - Palliative care following, prognosis is poor> pt and fam wish for Full Code and Full Scope of Rx -Toc following for SNF, today w/ Afib RVR  Severe mitral regurgitation -Noted on echo this admission, not felt to be a mitral clip candidate  Paroxysmal atrial fibrillation - New onset in August - Cardioverted 10/5 - was stable in sinus  rhythm on amiodarone and Eliquis  - Back in A-fib RVR now, blood pressure too soft, dose of digoxin X1 now, will request Cards eval> may need cardioversion again, discussed CODE STATUS again she wishes for full scope of Rx  AKI on CKD - Cardiorenal, failed high-dose diuretics - Treated with CRRT 9/30-10/10 - Started on intermittent hemodialysis 10/11 - Nephrology following, sp HD Cath and CLIP,  Hyponatremia - Stable  CAD -Stable at this time, no ACS on admission, continue atorvastatin   Acute hypoxic respiratory failure - Secondary to above, improving  Type 2 diabetes mellitus - Will continue Semglee   Microcytic anemia - Stable, monitor   DVT prophylaxis: Apixaban  Code Status: Full code, discussed CODE STATUS again Family Communication: No family at bedside today Disposition Plan: Plan for SNF in few days  Consultants:    Procedures:   Antimicrobials:    Objective: Vitals:   07/10/24 2346 07/11/24 0600 07/11/24 0700 07/11/24 0723  BP: 94/64   (!) 88/68  Pulse: 63 (!) 59 (!) 127 (!) 125  Resp: 20 20 (!) 23 19  Temp: 97.6 F (36.4 C)   97.8 F (36.6 C)  TempSrc: Oral   Oral  SpO2: 96% 98% 99% 100%  Weight:      Height:        Intake/Output Summary (Last 24 hours) at 07/11/2024 1137 Last data filed at 07/11/2024 1000 Gross per 24 hour  Intake 250 ml  Output --  Net 250 ml   Filed Weights   07/09/24 0433 07/10/24 0030 07/10/24 1125  Weight: 86.6 kg 86.7 kg 85.3 kg    Examination:   Chronically ill, AAO x 2, no distress HEENT: Right IJ HD catheter  CVS: S1-S2, irregular rhythm with RVR Lungs: Decreased breath sounds at the bases Abdomen: Soft, nontender bowel sounds present Remedies: Trace edema   Data Reviewed:   CBC: Recent Labs  Lab 07/05/24 0928 07/06/24 0543 07/07/24 0850 07/09/24 0523  WBC 6.5 7.0 7.3 7.1  NEUTROABS 5.4  --   --   --   HGB 9.8* 10.2* 10.4* 10.2*  HCT 28.8* 28.9* 30.0* 29.4*  MCV 77.4* 76.9* 78.3* 77.6*  PLT 170  165 176 168   Basic Metabolic Panel: Recent Labs  Lab 07/07/24 0505 07/08/24 0525 07/09/24 0523 07/10/24 0309 07/11/24 0235  NA 128* 128* 126* 126* 126*  K 4.3 4.3 4.8 4.6 4.3  CL 89* 90* 89* 89* 89*  CO2 24 25 23 25 25   GLUCOSE 160* 151* 170* 120* 175*  BUN 33* 26* 33* 41* 32*  CREATININE 4.70* 3.67* 4.79* 5.17* 4.15*  CALCIUM  8.6* 8.3* 8.7* 8.8* 8.2*  PHOS 4.7* 4.2 5.3* 5.3* 3.7   GFR: Estimated Creatinine Clearance: 12.6 mL/min (A) (by C-G formula based on SCr of 4.15 mg/dL (H)). Liver Function Tests: Recent Labs  Lab 07/07/24 0505 07/08/24 0525 07/09/24 0523 07/10/24 0309 07/11/24 0235  ALBUMIN 2.6* 2.5* 2.6* 2.4* 2.4*   No results for input(s): LIPASE, AMYLASE in the last 168 hours. No results for input(s): AMMONIA in the last 168 hours. Coagulation Profile: No results for input(s): INR, PROTIME in the last 168 hours. Cardiac Enzymes: No results for input(s): CKTOTAL, CKMB, CKMBINDEX, TROPONINI in the last 168 hours. BNP (last 3 results) No results for input(s): PROBNP in the last 8760 hours. HbA1C: No results for input(s): HGBA1C in the last 72 hours.  CBG: Recent Labs  Lab 07/10/24 1229 07/10/24 1520 07/10/24 2108 07/11/24 0603 07/11/24 0817  GLUCAP 90 134* 147* 197* 211*   Lipid Profile: No results for input(s): CHOL, HDL, LDLCALC, TRIG, CHOLHDL, LDLDIRECT in the last 72 hours. Thyroid  Function Tests: No results for input(s): TSH, T4TOTAL, FREET4, T3FREE, THYROIDAB in the last 72 hours. Anemia Panel: No results for input(s): VITAMINB12, FOLATE, FERRITIN, TIBC, IRON, RETICCTPCT in the last 72 hours. Urine analysis:    Component Value Date/Time   COLORURINE YELLOW 06/18/2024 1730   APPEARANCEUR CLEAR 06/18/2024 1730   LABSPEC 1.026 06/18/2024 1730   PHURINE 5.0 06/18/2024 1730   GLUCOSEU NEGATIVE 06/18/2024 1730   HGBUR NEGATIVE 06/18/2024 1730   BILIRUBINUR NEGATIVE 06/18/2024 1730    KETONESUR NEGATIVE 06/18/2024 1730   PROTEINUR NEGATIVE 06/18/2024 1730   UROBILINOGEN 0.2 04/13/2014 1612   NITRITE NEGATIVE 06/18/2024 1730   LEUKOCYTESUR NEGATIVE 06/18/2024 1730   Sepsis Labs: @LABRCNTIP (procalcitonin:4,lacticidven:4)  )No results found for this or any previous visit (from the past 240 hours).   Radiology Studies: No results found.   Scheduled Meds:  amiodarone  150 mg Intravenous Once   apixaban   5 mg Oral BID   atorvastatin   80 mg Oral Daily   Chlorhexidine  Gluconate Cloth  6 each Topical Q0600   digoxin  0.125 mg Oral Once   feeding supplement  237 mL Oral TID BM   guaiFENesin   600 mg Oral BID   insulin  aspart  0-5 Units Subcutaneous QHS   insulin  aspart  0-6 Units Subcutaneous TID WC   insulin  glargine-yfgn  10 Units Subcutaneous Daily   levothyroxine   50 mcg Oral Q0600   midodrine  20 mg Oral Q8H   multivitamin  1 tablet Oral QHS   polyethylene glycol  17 g Oral BID   scopolamine   1 patch Transdermal  Q72H   sodium chloride  flush  10-40 mL Intracatheter Q12H   sodium chloride  flush  3 mL Intravenous Q12H   sodium chloride  flush  3 mL Intravenous Q12H   Continuous Infusions:  amiodarone     Followed by   amiodarone        LOS: 24 days    Time spent:    Sigurd Pac, MD [facility]   07/11/2024, 11:37 AM

## 2024-07-11 NOTE — Progress Notes (Signed)
 PT Cancellation Note  Patient Details Name: FLANNERY CAVALLERO MRN: [id number] DOB: [birth year]   Cancelled Treatment:    Reason Eval/Treat Not Completed: Medical issues which prohibited therapy this afternoon, pt with transfer to ICU due to hypotension, initiation of pressors, and possible initiation of CRRT. Will continue to follow and re-evaluate as appropriate.   Izetta Call, PT, DPT   Acute Rehabilitation Department Office [phone] Secure Chat Communication Preferred   Izetta JULIANNA Call 07/11/2024, 2:21 PM

## 2024-07-11 NOTE — Progress Notes (Signed)
 Patient ID: [name], female   DOB: [birth year], 73 y.o.   MRN: [id number]    Progress Note from the Palliative Medicine Team at [facility]   Patient Name: [name]        Date: 07/11/2024 DOB: January 07, 1951  Age: 73 y.o. MRN#: [id number] Attending Physician: Fairy Frames, MD Primary Care Physician: Tobie Suzzane POUR, MD Admit Date: 06/17/2024   Reason for Consultation/Follow-up   Establishing Goals of Care   HPI/ Brief Hospital Review   73 y.o. female   admitted on 06/17/2024 with  medical history significant for CAD, persistent atrial fibrillation on Eliquis , chronic HFmrEF s/p CRT-D, LBBB, T2DM, HTN, HLD, CKD stage IIIa, hypothyroidism, OSA who presented to the ED for evaluation of chest pain, dyspnea, cough, increased swelling bilateral lower extremities  Hospitalization complicated by worsening renal function,  now on HD and tolerating this time.    Multiple comorbidities, is high risk for decompensation  Patient and her family face ongoing decisions regarding treatment options, advanced directives and anticipatory care needs.     Subjective  Extensive chart review has been completed prior to meeting with patient  including labs, vital signs, imaging, progress/consult notes, orders, medications and available advance directive documents.    This NP assessed patient at the bedside as a follow up for palliative medicine needs and emotional support. Alert,however  patient appears weak and with some SOB which improved with repositioning.  Discussed concern and importance of ongoing conversation with her family, she tells me her son is on his way to the hospital.         I will plan to meet with them when he arrives.  Later in the afternoon I reached her son by telephone and he reports to me his mother's decline and now transfer to ICU.   (Pt moved to CCU. Now on NE 2. Tachypneic. CVP 22.Nephrology to initiate CRRT)  Ongoing education regarding current medical  situation.  Son understands and tells me he had a good talk  with his mother and is following her wishes--open to all offered and available medical interventions to prolong life.    Education offered on the importance of ongoing conversation with patient and the medical providers regarding overall plan of care and treatment options to ensure decisions are within her values and goals of care.   Patient is high risk for further decompensation  PMT will continue to support holistically  Questions and concerns addressed   Discussed with nursing staff  Time: 35  minutes  Detailed review of medical records ( labs, imaging, vital signs), medically appropriate exam ( MS, skin, cardiac,  resp)   discussed with treatment team, counseling and education to patient, family, staff, documenting clinical information, medication management, coordination of care    Ronal Plants NP  Palliative Medicine Team Team Phone # [phone] Pager [phone]

## 2024-07-11 NOTE — TOC Progression Note (Addendum)
 Transition of Care [facility]) - Progression Note    Patient Details  Name: [name] MRN: [id number] Date of Birth: [birth year]  Transition of Care [facility]) CM/SW Contact  [name], [place] Phone Number: 07/11/2024, 10:43 AM  Clinical Narrative:   Per MD, patient not medically stable for discharge. Cards to see. CSW notified facility, family, and [facility]al Navigators.   Per Stephens, with [facility] admissions, facility will still have bed availability tomorrow for patient.   2:46 PM [facility] inquired about patient needing bipap machine. CSW inquired with family if patient has home bipap, awaiting response.   CSW inquired with MD if patient will be needing a bipap at SNF. Per MD, patient is not currently using a bipap and will not need one at this time. CSW informed facility.   CSW will continue to follow.    Expected Discharge Plan: Skilled Nursing Facility Barriers to Discharge: Continued Medical Work up               Expected Discharge Plan and Services   Discharge Planning Services: CM Consult Post Acute Care Choice: Skilled Nursing Facility Living arrangements for the past 2 months: Single Family Home                                       Social Drivers of Health (SDOH) Interventions SDOH Screenings   Food Insecurity: No Food Insecurity (06/18/2024)  Recent Concern: Food Insecurity - Food Insecurity Present (04/23/2024)  Housing: Low Risk  (06/18/2024)  Transportation Needs: No Transportation Needs (06/18/2024)  Utilities: Not At Risk (06/18/2024)  Alcohol Screen: Low Risk  (03/21/2024)  Depression (PHQ2-9): Low Risk  (04/23/2024)  Financial Resource Strain: Low Risk  (03/21/2024)  Physical Activity: Sufficiently Active (03/21/2024)  Social Connections: Moderately Integrated (06/18/2024)  Stress: No Stress Concern Present (03/21/2024)  Tobacco Use: Medium Risk (06/17/2024)  Health Literacy: Adequate Health Literacy (03/21/2024)    Readmission Risk  Interventions     No data to display

## 2024-07-11 NOTE — Progress Notes (Signed)
 Pt moved to CCU. Now on NE 2. Tachypneic. CVP 22. I have contacted nephrology to initiate CRRT.  Caffie Shed, PA-C

## 2024-07-11 NOTE — Significant Event (Signed)
 Rapid Response Event Note   Reason for Call :  Hypotension and RAF  Initial Focused Assessment:  Patient is alert and oriented.  She is very weak.  Amiodarone gtt infusing Family at bedside  BP 77/59  HR 117  RR 22-26  O2 sat 97% on 3L [place]   Dr Cherrie and team at bedside.    Interventions:  Started 2mcg Levophed  via PICC  Plan of Care:     Event Summary:   MD Notified: Dr Cherrie at bedside Call Time: 1250 Arrival Time: 1253 End Time: 1340  Elvin Portland, RN

## 2024-07-11 NOTE — Progress Notes (Addendum)
 Advanced Heart Failure Rounding Note  HF Cardiologist: Dr. Cherrie  Chief Complaint: CHF Subjective:    Seen earlier this admission w/ cardiogenic shock in setting of Afib, course c/b renal failure now HD dependent.  Had stabilized from HF standpoint with plans for SNF for rehab.  We are asked to see again d/t recurrent Afib with RVR.  Family at bedside. More lethargic and short of breath today. SBP 70s while I was in the room with her. CO-OX is 48%   Objective:    Weight Range: 85.3 kg Body mass index is 31.29 kg/m.   Vital Signs:   Temp:  [96.7 F (35.9 C)-97.8 F (36.6 C)] 97.8 F (36.6 C) (10/22 0723) Pulse Rate:  [39-127] 125 (10/22 0723) Resp:  [12-23] 19 (10/22 0723) BP: (79-97)/(54-79) 88/68 (10/22 0723) SpO2:  [96 %-100 %] 100 % (10/22 0723) Weight:  [85.3 kg] 85.3 kg (10/21 1125) Last BM Date : 07/06/24 (Started on bowel regimen)  Weight change: Filed Weights   07/09/24 0433 07/10/24 0030 07/10/24 1125  Weight: 86.6 kg 86.7 kg 85.3 kg   Intake/Output:  Intake/Output Summary (Last 24 hours) at 07/11/2024 1053 Last data filed at 07/10/2024 1700 Gross per 24 hour  Intake 50 ml  Output 1400 ml  Net -1350 ml   Physical Exam  General:  Fatigued appearing elderly female Neck: JVP to midneck Cor: Irregular rhythm, tachy. No murmurs. Lungs: clear Abdomen: soft, nontender, nondistended. No hepatosplenomegaly. No bruits or masses. Good bowel sounds. Extremities: legs are cool Neuro: Lethargic.    Tele: Afib 130s  Labs    CBC Recent Labs    07/09/24 0523  WBC 7.1  HGB 10.2*  HCT 29.4*  MCV 77.6*  PLT 168   Basic Metabolic Panel Recent Labs    89/78/74 0309 07/11/24 0235  NA 126* 126*  K 4.6 4.3  CL 89* 89*  CO2 25 25  GLUCOSE 120* 175*  BUN 41* 32*  CREATININE 5.17* 4.15*  CALCIUM  8.8* 8.2*  PHOS 5.3* 3.7   Liver Function Tests Recent Labs    07/10/24 0309 07/11/24 0235  ALBUMIN 2.4* 2.4*    BNP (last 3  results) Recent Labs    06/17/24 1813  BNP 1,998.5*   Medications:    Scheduled Medications:  amiodarone  200 mg Oral Daily   apixaban   5 mg Oral BID   atorvastatin   80 mg Oral Daily   Chlorhexidine  Gluconate Cloth  6 each Topical Q0600   digoxin  0.125 mg Oral Once   feeding supplement  237 mL Oral TID BM   guaiFENesin   600 mg Oral BID   insulin  aspart  0-5 Units Subcutaneous QHS   insulin  aspart  0-6 Units Subcutaneous TID WC   insulin  glargine-yfgn  10 Units Subcutaneous Daily   levothyroxine   50 mcg Oral Q0600   midodrine  20 mg Oral Q8H   multivitamin  1 tablet Oral QHS   polyethylene glycol  17 g Oral BID   scopolamine   1 patch Transdermal Q72H   sodium chloride  flush  10-40 mL Intracatheter Q12H   sodium chloride  flush  3 mL Intravenous Q12H   sodium chloride  flush  3 mL Intravenous Q12H    Infusions:    PRN Medications: acetaminophen  **OR** acetaminophen , albuterol , bisacodyl, calcium  carbonate, Gerhardt's butt cream, guaiFENesin -dextromethorphan , loperamide, ondansetron  **OR** ondansetron  (ZOFRAN ) IV, mouth rinse, senna-docusate, sodium chloride  flush, sodium chloride  flush, traMADol , trimethobenzamide  Patient Profile   [name] is a 73 y.o.  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   Admit with acute on chronic biventricular HF in setting of AF c/b acute renal failure.  Assessment/Plan   Acute on Chronic Biventricular Heart Failure  New onset RV Failure -> cardiogenic shock - Presented in cardiogenic shock, worsening renal failure, afib - echo 9/30: EF 20-25%, mod LVH, D-shaped septum, RV mod reduced, degenerative MV w/ severe MR - RHC 9/30: RA 17, PA 63/35 (46), PCW 40, Fick 5.7/2.8, TD 3.7/1.8, PVR 1.6, PAPi 1.6  - Now s/p TEE/DCCV had been off vasopressors, inotropes and tolerating HD.  - Hypotensive today after went back into Afib with  RVR. Start low dose NE and transfer to the ICU. CO-OX is 48%. Set up CVP - Continue midodrine 20 mg TID - tolerating HD okay. - Still concerned about her ability to function outside the hospital. Considering SNF for rehab on discharge. - Palliative care following, ongoing discussions. Not a candidate for advanced therapies.  Patient and her family want to continue with aggressive medical therapy for now.  Persistent Atrial Fibrillation - new onset 8/25 - Continue Eliquis  5 mg BID - 10/5 S/P TEE/DC-CV -->SR. Back in AF with RVR today.  Reload with IV amiodarone.  Severe MR - not noted on echo 5/23 - severe on echo 9/30 with degenerative valve - Not a clip candidate   Acute renal failure - cardio-renal  - iHD per nephrology,  may need CRRT depending on today's course - No significant change in renal recovery.  - Continue bladder scans.    CAD - LHC in 2015 showed 99% RCA occlusion with collaterals - hs-trop 35>35, No ACS - continue atorva 80 mg daily  CRITICAL CARE Performed by: COLLETTA SHAVER N   Total critical care time: 20 minutes  Critical care time was exclusive of separately billable procedures and treating other patients.  Critical care was necessary to treat or prevent imminent or life-threatening deterioration.  Critical care was time spent personally by me on the following activities: development of treatment plan with patient and/or surrogate as well as nursing, discussions with consultants, evaluation of patient's response to treatment, examination of patient, obtaining history from patient or surrogate, ordering and performing treatments and interventions, ordering and review of laboratory studies, ordering and review of radiographic studies, pulse oximetry and re-evaluation of patient's condition.    Length of Stay: [address] SHAVER SAILOR, PA-C  07/11/2024, 10:53 AM  Advanced Heart Failure Team Pager [phone] (M-F; 7a - 5p)  Please contact [facility]  for night-coverage after hours (5p -7a ) and weekends on [email or web address]  Agree with above.  Patient developed marked hypotension and SOB in setting of recurrent AF. RRT paged. We responded to patient;s bedside  Long talk with patient and family about desire to pursue progressive interventions give new ESRD, brittle HF and limited functional capacity. They would like to pursue ICU care  General:  Frail elderly SOB HEENT: normal Neck: supple.JVP tp ear Cor: irreg 2/6 SEM + HD cath Lungs: clear Abdomen: soft, nontender, nondistended. No hepatosplenomegaly. No bruits or masses. Good bowel sounds. Extremities: no cyanosis, clubbing, rash, edema + cool Neuro: alert & orientedx3, cranial nerves grossly intact. moves all 4 extremities w/o difficulty. Affect pleasant  She has developed recurrent shock in setting of recurrent AF. Family and patient want to proceed with aggressive care.  Start NE and IV amio.   Move  to ICU. Will need repeat DC-CV once resp status optimized.   CRITICAL CARE Performed by: Cherrie Sieving  Total critical care time: 50 minutes  Critical care time was exclusive of separately billable procedures and treating other patients.  Critical care was necessary to treat or prevent imminent or life-threatening deterioration.  Critical care was time spent personally by me (independent of midlevel providers or residents) on the following activities: development of treatment plan with patient and/or surrogate as well as nursing, discussions with consultants, evaluation of patient's response to treatment, examination of patient, obtaining history from patient or surrogate, ordering and performing treatments and interventions, ordering and review of laboratory studies, ordering and review of radiographic studies, pulse oximetry and re-evaluation of patient's condition.  Sieving Cherrie, MD  4:08 PM

## 2024-07-12 DIAGNOSIS — I5082 Biventricular heart failure: Secondary | ICD-10-CM | POA: Diagnosis not present

## 2024-07-12 DIAGNOSIS — I5023 Acute on chronic systolic (congestive) heart failure: Secondary | ICD-10-CM | POA: Diagnosis not present

## 2024-07-12 DIAGNOSIS — Z7189 Other specified counseling: Secondary | ICD-10-CM | POA: Diagnosis not present

## 2024-07-12 DIAGNOSIS — R570 Cardiogenic shock: Secondary | ICD-10-CM | POA: Diagnosis not present

## 2024-07-12 DIAGNOSIS — Z515 Encounter for palliative care: Secondary | ICD-10-CM | POA: Diagnosis not present

## 2024-07-12 LAB — CBC
HCT: 28.2 % — ABNORMAL LOW (ref 36.0–46.0)
Hemoglobin: 9.6 g/dL — ABNORMAL LOW (ref 12.0–15.0)
MCH: 26.9 pg (ref 26.0–34.0)
MCHC: 34 g/dL (ref 30.0–36.0)
MCV: 79 fL — ABNORMAL LOW (ref 80.0–100.0)
Platelets: 155 K/uL (ref 150–400)
RBC: 3.57 MIL/uL — ABNORMAL LOW (ref 3.87–5.11)
RDW: 19.5 % — ABNORMAL HIGH (ref 11.5–15.5)
WBC: 6.6 K/uL (ref 4.0–10.5)
nRBC: 0.9 % — ABNORMAL HIGH (ref 0.0–0.2)

## 2024-07-12 LAB — RENAL FUNCTION PANEL
Albumin: 2.5 g/dL — ABNORMAL LOW (ref 3.5–5.0)
Albumin: 2.5 g/dL — ABNORMAL LOW (ref 3.5–5.0)
Anion gap: 10 (ref 5–15)
Anion gap: 11 (ref 5–15)
BUN: 19 mg/dL (ref 8–23)
BUN: 12 mg/dL (ref 8–23)
CO2: 23 mmol/L (ref 22–32)
CO2: 23 mmol/L (ref 22–32)
Calcium: 7.9 mg/dL — ABNORMAL LOW (ref 8.9–10.3)
Calcium: 7.9 mg/dL — ABNORMAL LOW (ref 8.9–10.3)
Chloride: 89 mmol/L — ABNORMAL LOW (ref 98–111)
Chloride: 89 mmol/L — ABNORMAL LOW (ref 98–111)
Creatinine, Ser: 2.34 mg/dL — ABNORMAL HIGH (ref 0.44–1.00)
Creatinine, Ser: 1.92 mg/dL — ABNORMAL HIGH (ref 0.44–1.00)
GFR, Estimated: 21 mL/min — ABNORMAL LOW (ref >60)
GFR, Estimated: 27 mL/min — ABNORMAL LOW (ref >60)
Glucose, Bld: 333 mg/dL — ABNORMAL HIGH (ref 70–99)
Glucose, Bld: 432 mg/dL — ABNORMAL HIGH (ref 70–99)
Phosphorus: 2.3 mg/dL — ABNORMAL LOW (ref 2.5–4.6)
Phosphorus: 7.2 mg/dL — ABNORMAL HIGH (ref 2.5–4.6)
Potassium: 3.4 mmol/L — ABNORMAL LOW (ref 3.5–5.1)
Potassium: 6 mmol/L — ABNORMAL HIGH (ref 3.5–5.1)
Sodium: 122 mmol/L — ABNORMAL LOW (ref 135–145)
Sodium: 123 mmol/L — ABNORMAL LOW (ref 135–145)

## 2024-07-12 LAB — GLUCOSE, CAPILLARY
Glucose-Capillary: 131 mg/dL — ABNORMAL HIGH (ref 70–99)
Glucose-Capillary: 200 mg/dL — ABNORMAL HIGH (ref 70–99)
Glucose-Capillary: 214 mg/dL — ABNORMAL HIGH (ref 70–99)
Glucose-Capillary: 191 mg/dL — ABNORMAL HIGH (ref 70–99)

## 2024-07-12 LAB — BASIC METABOLIC PANEL WITH GFR
Anion gap: 11 (ref 5–15)
BUN: 11 mg/dL (ref 8–23)
CO2: 23 mmol/L (ref 22–32)
Calcium: 7.5 mg/dL — ABNORMAL LOW (ref 8.9–10.3)
Chloride: 88 mmol/L — ABNORMAL LOW (ref 98–111)
Creatinine, Ser: 1.81 mg/dL — ABNORMAL HIGH (ref 0.44–1.00)
GFR, Estimated: 29 mL/min — ABNORMAL LOW (ref >60)
Glucose, Bld: 423 mg/dL — ABNORMAL HIGH (ref 70–99)
Potassium: 5.9 mmol/L — ABNORMAL HIGH (ref 3.5–5.1)
Sodium: 122 mmol/L — ABNORMAL LOW (ref 135–145)

## 2024-07-12 LAB — COOXEMETRY PANEL
Carboxyhemoglobin: 1.2 % (ref 0.5–1.5)
Methemoglobin: <0.7 % (ref 0.0–1.5)
O2 Saturation: 60.2 %
Total hemoglobin: 10.6 g/dL — ABNORMAL LOW (ref 12.0–16.0)

## 2024-07-12 LAB — MAGNESIUM: Magnesium: 1.9 mg/dL (ref 1.7–2.4)

## 2024-07-12 MED ORDER — POTASSIUM PHOSPHATES 15 MMOLE/5ML IV SOLN
15.0000 mmol | Freq: Once | INTRAVENOUS | Status: AC
  Administered 2024-07-12: 15 mmol via INTRAVENOUS
  Filled 2024-07-12: qty 5

## 2024-07-12 MED ORDER — OXYCODONE HCL 5 MG PO TABS
5.0000 mg | ORAL_TABLET | Freq: Once | ORAL | Status: AC
  Administered 2024-07-12: 5 mg via ORAL
  Filled 2024-07-12: qty 1

## 2024-07-12 MED ORDER — MAGNESIUM SULFATE 2 GM/50ML IV SOLN
2.0000 g | Freq: Once | INTRAVENOUS | Status: AC
  Administered 2024-07-12: 2 g via INTRAVENOUS
  Filled 2024-07-12: qty 50

## 2024-07-12 MED ORDER — NOREPINEPHRINE 16 MG/250ML-% IV SOLN
0.0000 ug/min | INTRAVENOUS | Status: DC
  Administered 2024-07-12: 2 ug/min via INTRAVENOUS
  Administered 2024-07-12: 22 ug/min via INTRAVENOUS
  Administered 2024-07-13: 12 ug/min via INTRAVENOUS
  Administered 2024-07-13: 30 ug/min via INTRAVENOUS
  Administered 2024-07-14: 15 ug/min via INTRAVENOUS
  Administered 2024-07-15: 16 ug/min via INTRAVENOUS
  Administered 2024-07-16: 18 ug/min via INTRAVENOUS
  Filled 2024-07-12 (×7): qty 250

## 2024-07-12 MED ORDER — NALOXONE HCL 0.4 MG/ML IJ SOLN: 0.4000 mg | INTRAMUSCULAR | Status: DC | PRN

## 2024-07-12 MED ORDER — INSULIN ASPART 100 UNIT/ML IJ SOLN
0.0000 [IU] | Freq: Three times a day (TID) | INTRAMUSCULAR | Status: DC
  Administered 2024-07-12: 2 [IU] via SUBCUTANEOUS
  Administered 2024-07-12 – 2024-07-14 (×6): 1 [IU] via SUBCUTANEOUS
  Administered 2024-07-14 – 2024-07-15 (×2): 2 [IU] via SUBCUTANEOUS
  Administered 2024-07-15: 1 [IU] via SUBCUTANEOUS
  Administered 2024-07-15: 2 [IU] via SUBCUTANEOUS
  Administered 2024-07-16 (×3): 1 [IU] via SUBCUTANEOUS

## 2024-07-12 MED ORDER — INSULIN ASPART 100 UNIT/ML IJ SOLN: 0.0000 [IU] | Freq: Three times a day (TID) | INTRAMUSCULAR | Status: DC

## 2024-07-12 MED ORDER — SODIUM CHLORIDE 0.9 % IV SOLN: INTRAVENOUS | Status: AC

## 2024-07-12 MED ORDER — PRISMASOL BGK 4/2.5 32-4-2.5 MEQ/L EC SOLN
Status: DC
Start: 2024-07-12 — End: 2024-07-13

## 2024-07-12 MED ORDER — INSULIN ASPART 100 UNIT/ML IJ SOLN
2.0000 [IU] | Freq: Three times a day (TID) | INTRAMUSCULAR | Status: DC
  Administered 2024-07-12: 2 [IU] via SUBCUTANEOUS

## 2024-07-12 NOTE — Progress Notes (Signed)
 Physical Therapy Progress note Patient Details Name: [name] MRN: [id number] DOB: 1951-07-22 Today's Date: 07/12/2024   History of Present Illness 73 yo F adm 06/17/24 due to chest pain, SOB with volume overload, acute on chronic CHF. 10/5 DCCV. CRRT 9/30-10/10. 10/11 iHD initiated. Pt declining placed on pressors and CRRT starting 10/22. PMH: CHF, ICD, CAD, OSA on Bipap, LBBB, HTN, Afib, ICM, T2DM, obesity    PT Comments  Goals assessed and updated as appropriate. Pt had a decline in functional activities. Currently pt is min to Mod A for bed mobility, Mod A +2 for sit to stand and Mod- Max A for step pivot transfers. Pt unable to progress gait due to weakness, fatigue and CRRT limitation. Due to pt current functional status, home set up and available assistance at home recommending skilled physical therapy services < 3 hours/day in order to address strength, balance and functional mobility to decrease risk for falls, injury, immobility, skin break down and re-hospitalization.      If plan is discharge home, recommend the following: Assistance with cooking/housework;Assist for transportation;A little help with walking and/or transfers;A little help with bathing/dressing/bathroom   Can travel by private vehicle     No  Equipment Recommendations  Rolling walker (2 wheels);BSC/3in1;Wheelchair (measurements PT);Wheelchair cushion (measurements PT)       Precautions / Restrictions Precautions Precautions: Fall Recall of Precautions/Restrictions: Impaired Precaution/Restrictions Comments: watch BP, soft but improved in sitting Restrictions Weight Bearing Restrictions Per Provider Order: No     Mobility  Bed Mobility Overal bed mobility: Needs Assistance Bed Mobility: Rolling, Sidelying to Sit Rolling: Min assist Sidelying to sit: Mod assist, +2 for safety/equipment       General bed mobility comments: Min A for rolling to the R, Min A for trunk to mid line with HOB elevated,  Mod A for LE to EOB.    Transfers Overall transfer level: Needs assistance Equipment used: 2 person hand held assist Transfers: Sit to/from Stand, Bed to chair/wheelchair/BSC Sit to Stand: Mod assist, +2 physical assistance, +2 safety/equipment   Step pivot transfers: Mod assist, Max assist, +2 safety/equipment, +2 physical assistance       General transfer comment: Mod +2 for sit to stand from EOB with counting and verbal cues for sequencing. Mod to Max A for stepping from EOB to recliner with increased leaning toward the L, short uneven steps with flexed trunk posture    Ambulation/Gait     Pre-gait activities: pt was able to take steps from EOB to recliner with short shuffling uneven steps with Mod- Max A +2        Balance Overall balance assessment: Needs assistance Sitting-balance support: No upper extremity supported, Feet supported Sitting balance-Leahy Scale: Fair     Standing balance support: Bilateral upper extremity supported, During functional activity Standing balance-Leahy Scale: Poor Standing balance comment: reliant on external support        Communication Communication Communication: No apparent difficulties  Cognition Arousal: Alert Behavior During Therapy: Flat affect   PT - Cognitive impairments: No apparent impairments   Following commands: Intact      Cueing Cueing Techniques: Verbal cues     General Comments General comments (skin integrity, edema, etc.): Map 67 initially improved BP in sitting      Pertinent Vitals/Pain Pain Assessment Pain Assessment: No/denies pain Pain Intervention(s): Monitored during session     PT Goals (current goals can now be found in the care plan section) Acute Rehab PT Goals Patient Stated Goal: Get  well go home PT Goal Formulation: With patient Time For Goal Achievement: 07/26/24 Potential to Achieve Goals: Fair Progress towards PT goals: Not progressing toward goals - comment (pt had a decline in  functional mobility)    Frequency    Min 2X/week      PT Plan  Continue with current POC      Co-evaluation PT/OT/SLP Co-Evaluation/Treatment: Yes Reason for Co-Treatment: Complexity of the patient's impairments (multi-system involvement);Necessary to address cognition/behavior during functional activity;To address functional/ADL transfers;For patient/therapist safety PT goals addressed during session: Mobility/safety with mobility;Balance;Strengthening/ROM        AM-PAC PT 6 Clicks Mobility   Outcome Measure  Help needed turning from your back to your side while in a flat bed without using bedrails?: A Little Help needed moving from lying on your back to sitting on the side of a flat bed without using bedrails?: A Lot Help needed moving to and from a bed to a chair (including a wheelchair)?: A Lot Help needed standing up from a chair using your arms (e.g., wheelchair or bedside chair)?: Total Help needed to walk in hospital room?: Total Help needed climbing 3-5 steps with a railing? : Total 6 Click Score: 10    End of Session Equipment Utilized During Treatment: Oxygen;Gait belt Activity Tolerance: Patient tolerated treatment well Patient left: in chair;with call bell/phone within reach;with nursing/sitter in room Nurse Communication: Mobility status PT Visit Diagnosis: Unsteadiness on feet (R26.81);Other abnormalities of gait and mobility (R26.89);Difficulty in walking, not elsewhere classified (R26.2);Muscle weakness (generalized) (M62.81)     Time: 8860-8842 PT Time Calculation (min) (ACUTE ONLY): 18 min  Charges:    $Therapeutic Activity: 8-22 mins PT General Charges $$ ACUTE PT VISIT: 1 Visit                    Dorothyann Maier, DPT, CLT  Acute Rehabilitation Services Office: [phone] (Secure chat preferred)    Dorothyann VEAR Maier 07/12/2024, 12:14 PM

## 2024-07-12 NOTE — Progress Notes (Signed)
 [facility] Progress Note    Assessment/ Plan:   Anuric AKI (not improving) on CKD3b: Likely secondary to new RV heart failure, persistent atrial fibrillation, Cardiorenal Syndrome  -failed high dose diuretic challenge and CRRT initiated mainly for volume  Continued CRRT: 9/30-10/10. S/p TDC placement with IR on 10/15 -no evidence of renal recovery at this junction -HD on TTS schedule typically w/ midodrine support; try to avoid albumin -CLIP: accepted to [facility] @ 9:30am, start 10/23 -CRRT restarted 10/22 given hypotension/volume overload, goal CVP 6-7. C/w CRRT today. UFG: net neg 100-150cc/hr. Changing dialysate to 4k, rest of bags are 4K  Hyponatremia: hypervolemic.  Will attempt to manage with HD/UF   AoC BiV HF; New onset RV failure: pressors and inotropes weaned, on midodrine 20 TID now.  D/w cardiology in the past- think additional cardiac improvement is unlikely.  Will continue to UF as tolerated with HD   Atrial fibrillation with RVR: S/p cardioversion 10/5.  On eliquis . Back on IV amio. Per cardiology   CAD: On atorvastatin   Hypokalemia: changing all bags to 4k   Deconditioning - continue with PT/OT.  Awaiting SNF  Discussed with AHF and ICU RN.  Ephriam Stank, MD [facility]  Subjective:   Patient seen and examined on CRRT.  Transferred to ICU 10/22 for unstable A-fib/hypotension along with volume overload.  CVP's improving. Patient reports that her breathing is slightly better Pressors: NE Net negative ~1.2 L   Objective:   BP (!) 98/56   Pulse 82   Temp 98 F (36.7 C) (Oral)   Resp 13   Ht 5' 5 (1.651 m)   Wt 86.8 kg   SpO2 97%   BMI 31.84 kg/m   Intake/Output Summary (Last 24 hours) at 07/12/2024 0810 Last data filed at 07/12/2024 0800 Gross per 24 hour  Intake 2056.49 ml  Output 3399.2 ml  Net -1342.71 ml   Weight change: 1.6 kg  Physical Exam: Gen: ill appearing, NAD CVS: irreg irreg Resp:  decreased breath sounds bibasilar Abd: soft Ext: trace to 1+ pitting dependent edema Neuro: drowsy but arousable/interactive Dialysis access: RIJ TDC in use  Imaging: No results found.  Labs: BMET Recent Labs  Lab 07/07/24 0505 07/08/24 0525 07/09/24 0523 07/10/24 0309 07/11/24 0235 07/11/24 1600 07/11/24 1752 07/12/24 0405  NA 128* 128* 126* 126* 126* 118* 120* 122*  K 4.3 4.3 4.8 4.6 4.3 3.9 3.9 3.4*  CL 89* 90* 89* 89* 89* 81* 83* 89*  CO2 24 25 23 25 25  21* 23 23  GLUCOSE 160* 151* 170* 120* 175* 437* 423* 333*  BUN 33* 26* 33* 41* 32* 31* 29* 19  CREATININE 4.70* 3.67* 4.79* 5.17* 4.15* 3.91* 3.69* 2.34*  CALCIUM  8.6* 8.3* 8.7* 8.8* 8.2* 7.2* 7.8* 7.9*  PHOS 4.7* 4.2 5.3* 5.3* 3.7 3.2  --  2.3*   CBC Recent Labs  Lab 07/05/24 0928 07/06/24 0543 07/07/24 0850 07/09/24 0523 07/12/24 0405  WBC 6.5 7.0 7.3 7.1 6.6  NEUTROABS 5.4  --   --   --   --   HGB 9.8* 10.2* 10.4* 10.2* 9.6*  HCT 28.8* 28.9* 30.0* 29.4* 28.2*  MCV 77.4* 76.9* 78.3* 77.6* 79.0*  PLT 170 165 176 168 155    Medications:     apixaban   5 mg Oral BID   atorvastatin   80 mg Oral Daily   Chlorhexidine  Gluconate Cloth  6 each Topical Q0600   feeding supplement  237 mL Oral TID BM   guaiFENesin   600 mg Oral BID   insulin  aspart  0-5 Units Subcutaneous QHS   insulin  aspart  0-6 Units Subcutaneous TID WC   insulin  glargine-yfgn  10 Units Subcutaneous Daily   levothyroxine   50 mcg Oral Q0600   midodrine  20 mg Oral Q8H   multivitamin  1 tablet Oral QHS   polyethylene glycol  17 g Oral BID   scopolamine   1 patch Transdermal Q72H   sodium chloride  flush  10-40 mL Intracatheter Q12H   sodium chloride  flush  3 mL Intravenous Q12H   sodium chloride  flush  3 mL Intravenous Q12H      Ephriam Stank, MD [facility] 07/12/2024, 8:10 AM

## 2024-07-12 NOTE — Progress Notes (Cosign Needed)
 Advanced Heart Failure Rounding Note  HF Cardiologist: Dr. Cherrie  Chief Complaint: CHF Patient Profile     73 y/o F seen earlier this admission w/ cardiogenic shock in setting of Afib, course c/b renal failure now HD dependent.  Had stabilized from HF standpoint with plans for SNF for rehab.  We were asked to see again d/t recurrent Afib with RVR>>hypotension/shock. Pt moved back to CCU, started on NE and restarted on CRRT.   Subjective:    Continues in Afib, Rates improved now in the 90s.   Now on high dose NE, up to 22 mcg  3L pulled off CRRT yesterday but only net negative 1.2L.  CRRT ongoing, currently pulling 100-150 cc/hr.   CVP 12  OOB sitting up in chair. Still feels tired and weak but says breathing is overall much improved today. Appetite not great. Some nausea but no vomiting.   Objective:    Weight Range: 86.8 kg Body mass index is 31.84 kg/m.   Vital Signs:   Temp:  [94.4 F (34.7 C)-98 F (36.7 C)] 97 F (36.1 C) (10/23 1200) Pulse Rate:  [54-123] 101 (10/23 1200) Resp:  [12-33] 16 (10/23 1200) BP: (72-120)/(51-99) 97/66 (10/23 1200) SpO2:  [69 %-100 %] 94 % (10/23 1200) Weight:  [86.8 kg-86.9 kg] 86.8 kg (10/23 0500) Last BM Date : 07/10/24  Weight change: Filed Weights   07/10/24 1125 07/11/24 1330 07/12/24 0500  Weight: 85.3 kg 86.9 kg 86.8 kg   Intake/Output:  Intake/Output Summary (Last 24 hours) at 07/12/2024 1209 Last data filed at 07/12/2024 1200 Gross per 24 hour  Intake 2595.48 ml  Output 4150.2 ml  Net -1554.72 ml   Physical Exam   General:  weak/fatigued appearing, looks much older than age. No respiratory difficulty  Neck: JVP 12 cm, RIJ HD cath  Cor: irregularly irregular rhythm and rate  Lungs: diminished at the bases  Abdomen: mildly distended, soft, NT Extremities: trace b/l ankle edema, RIJ PICC  Neuro: alert and oriented, affect flat    Tele: Afib 90s-low 100s   Labs    CBC Recent Labs     07/12/24 0405  WBC 6.6  HGB 9.6*  HCT 28.2*  MCV 79.0*  PLT 155   Basic Metabolic Panel Recent Labs    89/77/74 1600 07/11/24 1752 07/12/24 0405  NA 118* 120* 122*  K 3.9 3.9 3.4*  CL 81* 83* 89*  CO2 21* 23 23  GLUCOSE 437* 423* 333*  BUN 31* 29* 19  CREATININE 3.91* 3.69* 2.34*  CALCIUM  7.2* 7.8* 7.9*  MG  --   --  1.9  PHOS 3.2  --  2.3*   Liver Function Tests Recent Labs    07/11/24 1752 07/12/24 0405  AST 16  --   ALT <5  --   ALKPHOS 50  --   BILITOT 1.1  --   PROT 6.3*  --   ALBUMIN 2.4* 2.5*    BNP (last 3 results) Recent Labs    06/17/24 1813  BNP 1,998.5*   Medications:    Scheduled Medications:  apixaban   5 mg Oral BID   atorvastatin   80 mg Oral Daily   Chlorhexidine  Gluconate Cloth  6 each Topical Q0600   feeding supplement  237 mL Oral TID BM   guaiFENesin   600 mg Oral BID   insulin  aspart  0-5 Units Subcutaneous QHS   insulin  aspart  0-6 Units Subcutaneous TID WC   insulin  aspart  2 Units Subcutaneous TID WC  insulin  glargine-yfgn  10 Units Subcutaneous Daily   levothyroxine   50 mcg Oral Q0600   midodrine  20 mg Oral Q8H   multivitamin  1 tablet Oral QHS   polyethylene glycol  17 g Oral BID   sodium chloride  flush  10-40 mL Intracatheter Q12H   sodium chloride  flush  3 mL Intravenous Q12H   sodium chloride  flush  3 mL Intravenous Q12H    Infusions:  amiodarone 30 mg/hr (07/12/24 1200)   norepinephrine  (LEVOPHED ) Adult infusion 24 mcg/min (07/12/24 1200)   potassium PHOSPHATE IVPB (in mmol) 43 mL/hr at 07/12/24 1200   prismasol BGK 4/2.5 500 mL/hr at 07/12/24 1155   prismasol BGK 4/2.5 400 mL/hr at 07/12/24 0403   prismasol BGK 4/2.5 1,500 mL/hr at 07/12/24 1155     PRN Medications: acetaminophen  **OR** acetaminophen , albuterol , bisacodyl, calcium  carbonate, Gerhardt's butt cream, guaiFENesin -dextromethorphan , heparin , loperamide, ondansetron  **OR** ondansetron  (ZOFRAN ) IV, mouth rinse, senna-docusate, sodium chloride  flush,  sodium chloride  flush, traMADol   Patient Profile   [name] is a 73 y.o.  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   Admit with acute on chronic biventricular HF in setting of AF c/b acute renal failure.  Assessment/Plan   Acute on Chronic Biventricular Heart Failure  New onset RV Failure -> cardiogenic shock - Presented in cardiogenic shock, worsening renal failure, afib - echo 9/30: EF 20-25%, mod LVH, D-shaped septum, RV mod reduced, degenerative MV w/ severe MR - RHC 9/30: RA 17, PA 63/35 (46), PCW 40, Fick 5.7/2.8, TD 3.7/1.8, PVR 1.6, PAPi 1.6  - back on NE at 22. Co-ox pending, CVP 12 - continue CRRT for volume management - wean down NE as tolerated  - Continue midodrine 20 mg TID - Still concerned about her ability to function outside the hospital. Considering SNF for rehab on discharge. - Palliative care following, ongoing discussions. Not a candidate for advanced therapies.  Patient and her family want to continue with aggressive medical therapy for now.  Persistent Atrial Fibrillation - new onset 8/25 - Continue Eliquis  5 mg BID - 10/5 S/P TEE/DC-CV -->SR. Back in AF with RVR, rates now 90s-low 100s - continue IV amiodarone.  Severe MR - not noted on echo 5/23 - severe on echo 9/30 with degenerative valve - Not a clip candidate   Acute renal failure - cardio-renal  - CRRT per nephrology,  appreciate assistance  - No significant change in renal recovery.    CAD - LHC in 2015 showed 99% RCA occlusion with collaterals - hs-trop 35>35, No ACS - continue atorva 80 mg daily  CRITICAL CARE Performed by: Caffie Shed   Total critical care time: 15 minutes  Critical care time was exclusive of separately billable procedures and treating other patients.  Critical care was necessary to treat or prevent imminent or life-threatening  deterioration.  Critical care was time spent personally by me on the following activities: development of treatment plan with patient and/or surrogate as well as nursing, discussions with consultants, evaluation of patient's response to treatment, examination of patient, obtaining history from patient or surrogate, ordering and performing treatments and interventions, ordering and review of laboratory studies, ordering and review of radiographic studies, pulse oximetry and re-evaluation of patient's condition.  Length of Stay: [address], PA-C  07/12/2024, 12:09 PM  Advanced Heart Failure Team Pager [phone] (M-F; 7a - 5p)  Please contact [facility] for  night-coverage after hours (5p -7a ) and weekends on [email or web address]  Agree with above.  Weak. Fatigued.   Remains on CRRT and NE. Stil in AF despite IV amio Co-ox 62%  General:  Weak appearing. No resp difficulty HEENT: normal Cor: Irreg 2/6 SEM RSB  Lungs: clear Abdomen: soft, nontender, nondistended. No hepatosplenomegaly. No bruits or masses. Good bowel sounds. Extremities: no cyanosis, clubbing, rash, 1+ edema Neuro: alert & orientedx3, cranial nerves grossly intact. moves all 4 extremities w/o difficulty. Affect pleasant  She is not tolerating AF well. Now on high-dose NE  Removing fluid with CRRT  Discussed sitaution with family. I am increasingly concerned about her trajectory.   We will proceed with repeat DC-CV tomorrow to see if we can improve hemodynamics with holding NSR.   If continues to struggle will need to continue to discuss GOC.  CRITICAL CARE Performed by: Cherrie Sieving  Total critical care time: 45 minutes  Critical care time was exclusive of separately billable procedures and treating other patients.  Critical care was necessary to treat or prevent imminent or life-threatening deterioration.  Critical care was time spent personally by me (independent of midlevel providers or  residents) on the following activities: development of treatment plan with patient and/or surrogate as well as nursing, discussions with consultants, evaluation of patient's response to treatment, examination of patient, obtaining history from patient or surrogate, ordering and performing treatments and interventions, ordering and review of laboratory studies, ordering and review of radiographic studies, pulse oximetry and re-evaluation of patient's condition.  Sieving Cherrie, MD  8:32 AM

## 2024-07-12 NOTE — Progress Notes (Signed)
 Occupational Therapy Treatment Patient Details Name: [name] MRN: [id number] DOB: [birth year] Today's Date: 07/12/2024   History of present illness 73 yo F adm 06/17/24 due to chest pain, SOB with volume overload, acute on chronic CHF. 10/5 DCCV. CRRT 9/30-10/10. 10/11 iHD initiated. Pt declining placed on pressors and CRRT starting 10/22. PMH: CHF, ICD, CAD, OSA on Bipap, LBBB, HTN, Afib, ICM, T2DM, obesity   OT comments  Pt progressing towards goals. Progressed to complete functional transfer with mod to max +2 assistance. Pt with limited verbalizations during session, and continues to be limited by decreased strength, balance, and activity tolerance. Continue to recommend <3 hours of skilled rehab daily to optimize independence levels. Will continue to follow acutely.       If plan is discharge home, recommend the following:  A lot of help with walking and/or transfers;A lot of help with bathing/dressing/bathroom;Assistance with cooking/housework;Supervision due to cognitive status;Assist for transportation   Equipment Recommendations  BSC/3in1    Recommendations for Other Services      Precautions / Restrictions Precautions Precautions: Fall Recall of Precautions/Restrictions: Impaired Precaution/Restrictions Comments: watch BP, soft but improved in sitting Restrictions Weight Bearing Restrictions Per Provider Order: No       Mobility Bed Mobility Overal bed mobility: Needs Assistance Bed Mobility: Rolling, Sidelying to Sit Rolling: Min assist Sidelying to sit: Mod assist, +2 for safety/equipment       General bed mobility comments: Assist for BLEs and light assist for trunk    Transfers Overall transfer level: Needs assistance Equipment used: 2 person hand held assist Transfers: Sit to/from Stand, Bed to chair/wheelchair/BSC Sit to Stand: Mod assist, +2 physical assistance, +2 safety/equipment     Step pivot transfers: Mod assist, Max assist, +2  safety/equipment, +2 physical assistance     General transfer comment: +2 with HH assist, poor initation with UEs. Heavy reliance on external support to complete step pivot to recliner     Balance Overall balance assessment: Needs assistance Sitting-balance support: No upper extremity supported, Feet supported Sitting balance-Leahy Scale: Fair     Standing balance support: Bilateral upper extremity supported, During functional activity Standing balance-Leahy Scale: Poor Standing balance comment: reliant on external support         ADL either performed or assessed with clinical judgement   ADL Overall ADL's : Needs assistance/impaired       Toilet Transfer: Moderate assistance;Maximal assistance;+2 for physical assistance;+2 for safety/equipment;Stand-pivot Toilet Transfer Details (indicate cue type and reason): 2 person [facility] assist         Functional mobility during ADLs: Moderate assistance;+2 for physical assistance General ADL Comments: Pt with poor sitting balance EOB requiring min assist to CGA at all times d/t posterior lean    Extremity/Trunk Assessment Upper Extremity Assessment Upper Extremity Assessment: Generalized weakness   Lower Extremity Assessment Lower Extremity Assessment: Defer to PT evaluation        Vision   Vision Assessment?: No apparent visual deficits         Communication Communication Communication: No apparent difficulties   Cognition Arousal: Alert Behavior During Therapy: Flat affect Cognition: No apparent impairments       Following commands: Intact Following commands impaired: Follows one step commands with increased time      Cueing   Cueing Techniques: Verbal cues        General Comments Pt with soft BP, with slight improvement with sitting EOB    Pertinent Vitals/ Pain       Pain Assessment Pain  Assessment: No/denies pain Pain Intervention(s): Monitored during session   Frequency  Min 2X/week         Progress Toward Goals  OT Goals(current goals can now be found in the care plan section)  Progress towards OT goals: Progressing toward goals  Acute Rehab OT Goals Patient Stated Goal: None stated OT Goal Formulation: With patient Time For Goal Achievement: 07/24/24 Potential to Achieve Goals: Good ADL Goals Pt Will Perform Grooming: with supervision;sitting Pt Will Perform Upper Body Dressing: with min assist;sitting;standing Pt Will Perform Lower Body Dressing: with min assist;sitting/lateral leans;sit to/from stand Pt Will Transfer to Toilet: with min assist;stand pivot transfer;bedside commode Pt/caregiver will Perform Home Exercise Program: Increased ROM;Increased strength;Both right and left upper extremity;With Supervision;With written HEP provided Additional ADL Goal #1: Pt will tolerate seated functional activity x15 min in order to improve activity tolerance for ADLs  Plan      Co-evaluation    PT/OT/SLP Co-Evaluation/Treatment: Yes Reason for Co-Treatment: Complexity of the patient's impairments (multi-system involvement);Necessary to address cognition/behavior during functional activity;To address functional/ADL transfers;For patient/therapist safety PT goals addressed during session: Mobility/safety with mobility;Balance;Strengthening/ROM OT goals addressed during session: ADL's and self-care;Strengthening/ROM      AM-PAC OT 6 Clicks Daily Activity     Outcome Measure   Help from another person eating meals?: A Little Help from another person taking care of personal grooming?: A Lot Help from another person toileting, which includes using toliet, bedpan, or urinal?: A Lot Help from another person bathing (including washing, rinsing, drying)?: A Lot Help from another person to put on and taking off regular upper body clothing?: A Lot Help from another person to put on and taking off regular lower body clothing?: A Lot 6 Click Score: 13    End of Session  Equipment Utilized During Treatment: Gait belt;Oxygen  OT Visit Diagnosis: Unsteadiness on feet (R26.81);Other abnormalities of gait and mobility (R26.89);Muscle weakness (generalized) (M62.81)   Activity Tolerance Patient limited by fatigue   Patient Left in chair;with call bell/phone within reach;with nursing/sitter in room   Nurse Communication Mobility status        Time: 8860-8843 OT Time Calculation (min): 17 min  Charges: OT Treatments $Self Care/Home Management : 8-22 mins  Adrianne BROCKS, OT  Acute Rehabilitation Services Office [phone] Secure chat preferred   Adrianne GORMAN Savers 07/12/2024, 1:42 PM

## 2024-07-12 NOTE — Progress Notes (Signed)
 Palliative:  HPI: 73 y.o. female admitted on 06/17/2024 with medical history significant for CAD, persistent atrial fibrillation on Eliquis , chronic HFmrEF s/p CRT-D, LBBB, T2DM, HTN, HLD, CKD stage IIIa, hypothyroidism, OSA who presented to the ED for evaluation of chest pain, dyspnea, cough, increased swelling bilateral lower extremities. Patient remains on CRRT and per nephrology would not be able to tolerate iHD due to need for multiple pressors. 10/9 CRRT stopped. Transition to [facility] 10/11. Decline with AF RVR and hypotension 10/22 resulting in return to ICU with initiation of vasopressor support and CRRT again.   I met today with [name]. She awakens and interacts with me. No family at bedside but RN present. Mr. Bustamante and I review how frustrating it must be to be back in ICU where we started. I acknowledged how much she has wanted to improve but shared that this was our fear that her heart would not be strong enough to continue to support her through dialysis. [name] verbalizes understanding. I asked [name] what she was thinking and feeling about this decline backwards. She tells me she doesn't know. We discussed the importance of considering our path forward and if our interventions are truly helping her get to where she wants to be. We discussed that it would not be unreasonable to consider stopping dialysis if her body is not tolerating well anyway. [name] shares that she doesn't know what to do or what she wants. I encouraged her to think and pray more about how we move forward and support her. She agrees. I also discussed with her code status and my concern that those interventions would only cause her more pain and suffering and would not ultimately be helpful to her. She tells me she will think more about this too.   Update: I returned to bedside. I met with son, [name], and his wife, [name], at bedside. [name] is sleeping soundly in recliner and does not awaken during my visit  this afternoon. I relayed my conversation with [name] this morning to family. She gave me permission to share with her family this morning. I shared that I feel [name] is beginning to realize the obstacles in front of her and may be considering if this is what she really wants. Family at bedside express understanding and [name] shares that she did make some comments to him yesterday indicating the same. I spoke more with them about limitations and my fear that she will be unable to leave the hospital. I do not see a good path for [name] at this stage. Family understand and see this as well. [name] reiterates to me again today that he has made his peace and is accepting if it is her time but he just wants to support her wishes. We spoke further about this and her comfort and how she would not have a peaceful death if she is full code and what this could mean for him having to make difficult decisions on her behalf. I encouraged that we continue to gently explain to her what this means and looks like. [name] understands and will speak with her again before he leaves for the day. If she indicates desire for DNR status he knows to let RN know so we can coordinate and put this in place to protect her from further suffering at the end of her life. We will also get chaplain support to follow along as well for additional support.   All questions/concerns addressed. Emotional support  provided. Updated RN and medical team.   Exam: Lethargic. Awakens and oriented. Very poor reserve. Very weak. No distress. Breathing regular, unlabored. Abd soft. BLE edema.   Plan: - Ongoing conversations and clarification of wishes - Chaplain support  55 min  [name] Kitty, NP Palliative Medicine Team Pager [phone] (Please see [email or web address] for schedule) Team Phone [phone]

## 2024-07-12 NOTE — Progress Notes (Signed)
 PROGRESS NOTE    [name]  FMW:[id number] DOB: [birth year] DOA: 06/17/2024 PCP: Tobie Suzzane POUR, MD  73 y.o. with history of combined systolic and diastolic heart failure s/p BiV ICD, CAD, OSA on bipap, LBBB, HTN, hypotension, and atrial fibrillation.  - Admitted with volume overload, AKI - Echo 9/30 noted EF 20-25%, severe MR, moderately reduced RV - RHC 9/30> elevated PA pressures and filling pressures, PA PI of 1.6 - Treated with inotropes and vasopressors in ICU, now off - Complicated by worsening renal failure, cardiorenal syndrome, failed high-dose diuretic challenge, then started on CRRT from 9/30-10/10 -sp cardioversion for A-fib -Palliative care consulted and following - 10/11 initiated hemodialysis - Transferred out of ICU to [facility] service 10/14 -10/15: Tunneled dialysis catheter placed in IR - Multiple palliative care conversations, full code and full scope - CLIP for HD completed : Accepted to [facility] - 10/21 PM noted to be back in A-fib -10/22: A-fib RVR with hypotension> started on Amio gtt., transferred to ICU, started CRRT for volume overload, started on Norepi  Subjective: - Started CRRT yesterday, breathing better, in AFib but HR improved, on NE  Assessment and Plan:  Acute on chronic biventricular failure Cardiogenic shock -Admitted with cardiogenic shock, worsening renal failure and A-fib - Echo with EF 2025, moderately reduced RV, severe mitral regurgitation -RHC 9/30 noted elevated PA pressures wedge of 40, PA PI 1.6 - Treated with vasopressors, inotropes and high-dose diuretics - Underwent TEE/DCCV - With worsening renal failure started CRRT then intermittent hemodialysis since 10/11 - Continue high-dose midodrine -Now back on CRRT since 10/22, CVP improving, on NE-wean as tol - Palliative care following, prognosis is poor> pt and fam wish for Full Code and Full Scope of Rx  Severe mitral regurgitation -Noted on echo this admission,  not felt to be a mitral clip candidate  Paroxysmal atrial fibrillation - New onset in August - Cardioverted 10/5 - was stable in sinus rhythm on amiodarone and Eliquis  - Back in A-fib RVR 10/22, back on amio gtt, HR better, plan for DCCV when volume status improved and stable  AKI on CKD - Cardiorenal, failed high-dose diuretics - Treated with CRRT 9/30-10/10 - Started on intermittent hemodialysis 10/11 - Nephrology following, sp HD Cath and CLIP, -now CRRT as above  Hyponatremia - Stable  CAD -Stable at this time, no ACS on admission, continue atorvastatin   Acute hypoxic respiratory failure - Secondary to above, improving  Type 2 diabetes mellitus - Will continue Semglee , add novolog  2 units TID AS  Microcytic anemia - Stable, monitor   DVT prophylaxis: Apixaban  Code Status: Full code, discussed CODE STATUS again Family Communication: No family at bedside today Disposition Plan: Plan for SNF when stable  Consultants:    Procedures:   Antimicrobials:    Objective: Vitals:   07/12/24 1045 07/12/24 1100 07/12/24 1105 07/12/24 1110  BP: 97/64 (!) 84/55 (!) 92/55 91/60  Pulse: 81 80 78 88  Resp: 13 13 14 13   Temp:      TempSrc:      SpO2: 100% 99% 99% 99%  Weight:      Height:        Intake/Output Summary (Last 24 hours) at 07/12/2024 1120 Last data filed at 07/12/2024 1100 Gross per 24 hour  Intake 2528.96 ml  Output 3936.2 ml  Net -1407.24 ml   Filed Weights   07/10/24 1125 07/11/24 1330 07/12/24 0500  Weight: 85.3 kg 86.9 kg 86.8 kg    Examination:  Chronically ill, AAO x 2, no distress HEENT: Right IJ HD catheter, + JVD CVS: S1-S2, irregular rhythm Lungs: Decreased breath sounds at the bases Abdomen: Soft, nontender bowel sounds present Remedies: Trace edema   Data Reviewed:   CBC: Recent Labs  Lab 07/06/24 0543 07/07/24 0850 07/09/24 0523 07/12/24 0405  WBC 7.0 7.3 7.1 6.6  HGB 10.2* 10.4* 10.2* 9.6*  HCT 28.9* 30.0* 29.4*  28.2*  MCV 76.9* 78.3* 77.6* 79.0*  PLT 165 176 168 155   Basic Metabolic Panel: Recent Labs  Lab 07/09/24 0523 07/10/24 0309 07/11/24 0235 07/11/24 1600 07/11/24 1752 07/12/24 0405  NA 126* 126* 126* 118* 120* 122*  K 4.8 4.6 4.3 3.9 3.9 3.4*  CL 89* 89* 89* 81* 83* 89*  CO2 23 25 25  21* 23 23  GLUCOSE 170* 120* 175* 437* 423* 333*  BUN 33* 41* 32* 31* 29* 19  CREATININE 4.79* 5.17* 4.15* 3.91* 3.69* 2.34*  CALCIUM  8.7* 8.8* 8.2* 7.2* 7.8* 7.9*  MG  --   --   --   --   --  1.9  PHOS 5.3* 5.3* 3.7 3.2  --  2.3*   GFR: Estimated Creatinine Clearance: 22.6 mL/min (A) (by C-G formula based on SCr of 2.34 mg/dL (H)). Liver Function Tests: Recent Labs  Lab 07/10/24 0309 07/11/24 0235 07/11/24 1600 07/11/24 1752 07/12/24 0405  AST  --   --   --  16  --   ALT  --   --   --  <5  --   ALKPHOS  --   --   --  50  --   BILITOT  --   --   --  1.1  --   PROT  --   --   --  6.3*  --   ALBUMIN 2.4* 2.4* 2.2* 2.4* 2.5*   No results for input(s): LIPASE, AMYLASE in the last 168 hours. No results for input(s): AMMONIA in the last 168 hours. Coagulation Profile: No results for input(s): INR, PROTIME in the last 168 hours. Cardiac Enzymes: No results for input(s): CKTOTAL, CKMB, CKMBINDEX, TROPONINI in the last 168 hours. BNP (last 3 results) No results for input(s): PROBNP in the last 8760 hours. HbA1C: No results for input(s): HGBA1C in the last 72 hours.  CBG: Recent Labs  Lab 07/11/24 1151 07/11/24 1717 07/11/24 1730 07/11/24 2110 07/12/24 0614  GLUCAP 163* 433* 371* 134* 131*   Lipid Profile: No results for input(s): CHOL, HDL, LDLCALC, TRIG, CHOLHDL, LDLDIRECT in the last 72 hours. Thyroid  Function Tests: No results for input(s): TSH, T4TOTAL, FREET4, T3FREE, THYROIDAB in the last 72 hours. Anemia Panel: No results for input(s): VITAMINB12, FOLATE, FERRITIN, TIBC, IRON, RETICCTPCT in the last 72 hours. Urine  analysis:    Component Value Date/Time   COLORURINE YELLOW 06/18/2024 1730   APPEARANCEUR CLEAR 06/18/2024 1730   LABSPEC 1.026 06/18/2024 1730   PHURINE 5.0 06/18/2024 1730   GLUCOSEU NEGATIVE 06/18/2024 1730   HGBUR NEGATIVE 06/18/2024 1730   BILIRUBINUR NEGATIVE 06/18/2024 1730   KETONESUR NEGATIVE 06/18/2024 1730   PROTEINUR NEGATIVE 06/18/2024 1730   UROBILINOGEN 0.2 04/13/2014 1612   NITRITE NEGATIVE 06/18/2024 1730   LEUKOCYTESUR NEGATIVE 06/18/2024 1730   Sepsis Labs: @LABRCNTIP (procalcitonin:4,lacticidven:4)  )No results found for this or any previous visit (from the past 240 hours).   Radiology Studies: No results found.   Scheduled Meds:  apixaban   5 mg Oral BID   atorvastatin   80 mg Oral Daily   Chlorhexidine  Gluconate Cloth  6 each Topical Q0600   feeding supplement  237 mL Oral TID BM   guaiFENesin   600 mg Oral BID   insulin  aspart  0-5 Units Subcutaneous QHS   insulin  aspart  0-6 Units Subcutaneous TID WC   insulin  glargine-yfgn  10 Units Subcutaneous Daily   levothyroxine   50 mcg Oral Q0600   midodrine  20 mg Oral Q8H   multivitamin  1 tablet Oral QHS   polyethylene glycol  17 g Oral BID   scopolamine   1 patch Transdermal Q72H   sodium chloride  flush  10-40 mL Intracatheter Q12H   sodium chloride  flush  3 mL Intravenous Q12H   sodium chloride  flush  3 mL Intravenous Q12H   Continuous Infusions:  amiodarone 30 mg/hr (07/12/24 1100)   norepinephrine  (LEVOPHED ) Adult infusion 22 mcg/min (07/12/24 1100)   potassium PHOSPHATE IVPB (in mmol) 15 mmol (07/12/24 1119)   prismasol BGK 4/2.5 500 mL/hr at 07/12/24 0142   prismasol BGK 4/2.5 400 mL/hr at 07/12/24 0403   prismasol BGK 4/2.5 1,500 mL/hr at 07/12/24 9166      LOS: 25 days    Time spent:    Sigurd Pac, MD [facility]   07/12/2024, 11:20 AM

## 2024-07-13 DIAGNOSIS — I4819 Other persistent atrial fibrillation: Secondary | ICD-10-CM | POA: Diagnosis not present

## 2024-07-13 DIAGNOSIS — I5082 Biventricular heart failure: Secondary | ICD-10-CM | POA: Diagnosis not present

## 2024-07-13 DIAGNOSIS — Z66 Do not resuscitate: Secondary | ICD-10-CM

## 2024-07-13 DIAGNOSIS — I5023 Acute on chronic systolic (congestive) heart failure: Secondary | ICD-10-CM | POA: Diagnosis not present

## 2024-07-13 DIAGNOSIS — N1832 Chronic kidney disease, stage 3b: Secondary | ICD-10-CM | POA: Diagnosis not present

## 2024-07-13 DIAGNOSIS — R570 Cardiogenic shock: Secondary | ICD-10-CM | POA: Diagnosis not present

## 2024-07-13 DIAGNOSIS — Z7189 Other specified counseling: Secondary | ICD-10-CM | POA: Diagnosis not present

## 2024-07-13 DIAGNOSIS — Z515 Encounter for palliative care: Secondary | ICD-10-CM | POA: Diagnosis not present

## 2024-07-13 LAB — CBC
HCT: 30.9 % — ABNORMAL LOW (ref 36.0–46.0)
Hemoglobin: 10.5 g/dL — ABNORMAL LOW (ref 12.0–15.0)
MCH: 27.3 pg (ref 26.0–34.0)
MCHC: 34 g/dL (ref 30.0–36.0)
MCV: 80.3 fL (ref 80.0–100.0)
Platelets: 151 K/uL (ref 150–400)
RBC: 3.85 MIL/uL — ABNORMAL LOW (ref 3.87–5.11)
RDW: 20.5 % — ABNORMAL HIGH (ref 11.5–15.5)
WBC: 7.8 K/uL (ref 4.0–10.5)
nRBC: 0.5 % — ABNORMAL HIGH (ref 0.0–0.2)

## 2024-07-13 LAB — GLUCOSE, CAPILLARY
Glucose-Capillary: 187 mg/dL — ABNORMAL HIGH (ref 70–99)
Glucose-Capillary: 179 mg/dL — ABNORMAL HIGH (ref 70–99)
Glucose-Capillary: 154 mg/dL — ABNORMAL HIGH (ref 70–99)
Glucose-Capillary: 171 mg/dL — ABNORMAL HIGH (ref 70–99)

## 2024-07-13 LAB — RENAL FUNCTION PANEL
Albumin: 2.6 g/dL — ABNORMAL LOW (ref 3.5–5.0)
Albumin: 2.5 g/dL — ABNORMAL LOW (ref 3.5–5.0)
Anion gap: 13 (ref 5–15)
Anion gap: 10 (ref 5–15)
BUN: 9 mg/dL (ref 8–23)
BUN: 8 mg/dL (ref 8–23)
CO2: 22 mmol/L (ref 22–32)
CO2: 24 mmol/L (ref 22–32)
Calcium: 7.7 mg/dL — ABNORMAL LOW (ref 8.9–10.3)
Calcium: 8.2 mg/dL — ABNORMAL LOW (ref 8.9–10.3)
Chloride: 90 mmol/L — ABNORMAL LOW (ref 98–111)
Chloride: 96 mmol/L — ABNORMAL LOW (ref 98–111)
Creatinine, Ser: 1.38 mg/dL — ABNORMAL HIGH (ref 0.44–1.00)
Creatinine, Ser: 1.25 mg/dL — ABNORMAL HIGH (ref 0.44–1.00)
GFR, Estimated: 40 mL/min — ABNORMAL LOW (ref >60)
GFR, Estimated: 45 mL/min — ABNORMAL LOW (ref >60)
Glucose, Bld: 366 mg/dL — ABNORMAL HIGH (ref 70–99)
Glucose, Bld: 267 mg/dL — ABNORMAL HIGH (ref 70–99)
Phosphorus: 7 mg/dL — ABNORMAL HIGH (ref 2.5–4.6)
Phosphorus: 2.2 mg/dL — ABNORMAL LOW (ref 2.5–4.6)
Potassium: 6.2 mmol/L — ABNORMAL HIGH (ref 3.5–5.1)
Potassium: 3.6 mmol/L (ref 3.5–5.1)
Sodium: 125 mmol/L — ABNORMAL LOW (ref 135–145)
Sodium: 130 mmol/L — ABNORMAL LOW (ref 135–145)

## 2024-07-13 LAB — COOXEMETRY PANEL
Carboxyhemoglobin: 1.1 % (ref 0.5–1.5)
Methemoglobin: 1 % (ref 0.0–1.5)
O2 Saturation: 60.8 %
Total hemoglobin: 11 g/dL — ABNORMAL LOW (ref 12.0–16.0)

## 2024-07-13 LAB — MAGNESIUM: Magnesium: 2.4 mg/dL (ref 1.7–2.4)

## 2024-07-13 MED ORDER — SODIUM ZIRCONIUM CYCLOSILICATE 10 G PO PACK
10.0000 g | PACK | Freq: Once | ORAL | Status: AC
  Administered 2024-07-13: 10 g via ORAL
  Filled 2024-07-13: qty 1

## 2024-07-13 MED ORDER — PRISMASOL BGK 2/3.5 32-2-3.5 MEQ/L EC SOLN
Status: DC
Start: 2024-07-13 — End: 2024-07-14

## 2024-07-13 MED ORDER — MIDAZOLAM HCL 2 MG/2ML IJ SOLN
INTRAMUSCULAR | Status: AC
  Administered 2024-07-13: 2 mg via INTRAVENOUS
  Filled 2024-07-13: qty 2

## 2024-07-13 MED ORDER — VASOPRESSIN 20 UNITS/100 ML INFUSION FOR SHOCK
0.0000 [IU]/min | INTRAVENOUS | Status: DC
  Administered 2024-07-13 – 2024-07-16 (×7): 0.03 [IU]/min via INTRAVENOUS
  Filled 2024-07-13 (×8): qty 100

## 2024-07-13 MED ORDER — MIDAZOLAM HCL (PF) 2 MG/2ML IJ SOLN
2.0000 mg | Freq: Once | INTRAMUSCULAR | Status: AC
  Administered 2024-07-13: 2 mg via INTRAVENOUS

## 2024-07-13 MED ORDER — INSULIN ASPART 100 UNIT/ML IJ SOLN: 3.0000 [IU] | Freq: Three times a day (TID) | INTRAMUSCULAR | Status: DC

## 2024-07-13 MED ORDER — PRISMASOL BGK 2/3.5 32-2-3.5 MEQ/L EC SOLN: Status: DC

## 2024-07-13 MED ORDER — FENTANYL CITRATE (PF) 50 MCG/ML IJ SOSY
PREFILLED_SYRINGE | INTRAMUSCULAR | Status: AC
  Filled 2024-07-13: qty 1

## 2024-07-13 MED ORDER — KETAMINE HCL 50 MG/5ML IJ SOSY
50.0000 mg | PREFILLED_SYRINGE | Freq: Once | INTRAMUSCULAR | Status: DC
  Filled 2024-07-13: qty 5

## 2024-07-13 MED ORDER — HYDROMORPHONE HCL 1 MG/ML IJ SOLN
0.5000 mg | INTRAMUSCULAR | Status: DC | PRN
  Filled 2024-07-13: qty 1

## 2024-07-13 NOTE — Progress Notes (Signed)
 PROGRESS NOTE    [name]  FMW:[id number] DOB: 05-03-1951 DOA: 06/17/2024 PCP: Tobie Suzzane POUR, MD  73 y.o. with history of combined systolic and diastolic heart failure s/p BiV ICD, CAD, OSA on bipap, LBBB, HTN, hypotension, and atrial fibrillation.  - Admitted with volume overload, AKI - Echo 9/30 noted EF 20-25%, severe MR, moderately reduced RV - RHC 9/30> elevated PA pressures and filling pressures, PA PI of 1.6 - Treated with inotropes and vasopressors in ICU, now off - Complicated by worsening renal failure, cardiorenal syndrome, failed high-dose diuretic challenge, then started on CRRT from 9/30-10/10 -sp cardioversion for A-fib -Palliative care consulted and following - 10/11 initiated hemodialysis - Transferred out of ICU to [facility] service 10/14 -10/15: Tunneled dialysis catheter placed in IR - Multiple palliative care conversations, full code and full scope - CLIP for HD completed : Accepted to [facility] - 10/21 PM noted to be back in A-fib -10/22: A-fib RVR with hypotension> started on Amio gtt., transferred to ICU, started CRRT for volume overload, started on Norepi - 10/23, CRRT continued, remains on NE  Subjective: - CVP improving, CRRT ultrafiltration decreased, remains on NIV, now 30 mcg  Assessment and Plan:  Acute on chronic biventricular failure Cardiogenic shock -Admitted with cardiogenic shock, worsening renal failure and A-fib - Echo with EF 2025, moderately reduced RV, severe mitral regurgitation -RHC 9/30 noted elevated PA pressures wedge of 40, PA PI 1.6 - Treated with vasopressors, inotropes and high-dose diuretics, TEE/DCCV - With worsening renal failure started CRRT then intermittent hemodialysis since 10/11, continued on high-dose midodrine -Now back on CRRT since 10/22, CVP improving, on NE-wean as tol - Palliative care following, prognosis is poor> finally agreeable to DNR yesterday, continue goals of care discussions,  Severe mitral  regurgitation -Noted on echo this admission, not felt to be a mitral clip candidate  Paroxysmal atrial fibrillation - New onset in August - Cardioverted 10/5 - was stable in sinus rhythm on amiodarone and Eliquis  - Back in A-fib RVR 10/22, back on amio gtt, HR better, plan for DCCV  AKI on CKD4 - Cardiorenal, failed high-dose diuretics - Treated with CRRT 9/30-10/10 - Started on intermittent hemodialysis 10/11 - Nephrology following, sp HD Cath and CLIP, -now CRRT as above  Hyponatremia - Stable  CAD -Stable at this time, no ACS on admission, continue atorvastatin   Acute hypoxic respiratory failure - Secondary to above, improving  Type 2 diabetes mellitus - Will continue Semglee , increase meal coverage  Microcytic anemia - Stable, monitor   DVT prophylaxis: Apixaban  Code Status: DNR Family Communication: No family at bedside today, will update son Disposition Plan: To be determined, SNF if she stabilizes and improves    Objective: Vitals:   07/13/24 0920 07/13/24 0930 07/13/24 0940 07/13/24 0950  BP: (!) 85/73 90/62 95/67  94/66  Pulse: 100 91 95 (!) 103  Resp: 10 11 14 14   Temp:      TempSrc:      SpO2: 98% 95% 99% 98%  Weight:      Height:        Intake/Output Summary (Last 24 hours) at 07/13/2024 0958 Last data filed at 07/13/2024 0900 Gross per 24 hour  Intake 2327.12 ml  Output 4717 ml  Net -2389.88 ml   Filed Weights   07/11/24 1330 07/12/24 0500 07/13/24 0457  Weight: 86.9 kg 86.8 kg 85 kg    Examination:   Chronically ill, AAO x 2, no distress HEENT: Right IJ HD catheter, + JVD CVS:  S1-S2, irregular rhythm Lungs: Decreased breath sounds at the bases Abdomen: Soft, nontender bowel sounds present Remedies: Trace edema, cold extremities   Data Reviewed:   CBC: Recent Labs  Lab 07/07/24 0850 07/09/24 0523 07/12/24 0405 07/13/24 0500  WBC 7.3 7.1 6.6 7.8  HGB 10.4* 10.2* 9.6* 10.5*  HCT 30.0* 29.4* 28.2* 30.9*  MCV 78.3* 77.6*  79.0* 80.3  PLT 176 168 155 151   Basic Metabolic Panel: Recent Labs  Lab 07/11/24 0235 07/11/24 1600 07/11/24 1752 07/12/24 0405 07/12/24 1608 07/12/24 1709 07/13/24 0500  NA 126* 118* 120* 122* 123* 122* 125*  K 4.3 3.9 3.9 3.4* 6.0* 5.9* 6.2*  CL 89* 81* 83* 89* 89* 88* 90*  CO2 25 21* 23 23 23 23 22   GLUCOSE 175* 437* 423* 333* 432* 423* 366*  BUN 32* 31* 29* 19 12 11 9   CREATININE 4.15* 3.91* 3.69* 2.34* 1.92* 1.81* 1.38*  CALCIUM  8.2* 7.2* 7.8* 7.9* 7.9* 7.5* 7.7*  MG  --   --   --  1.9  --   --  2.4  PHOS 3.7 3.2  --  2.3* 7.2*  --  7.0*   GFR: Estimated Creatinine Clearance: 37.9 mL/min (A) (by C-G formula based on SCr of 1.38 mg/dL (H)). Liver Function Tests: Recent Labs  Lab 07/11/24 1600 07/11/24 1752 07/12/24 0405 07/12/24 1608 07/13/24 0500  AST  --  16  --   --   --   ALT  --  <5  --   --   --   ALKPHOS  --  50  --   --   --   BILITOT  --  1.1  --   --   --   PROT  --  6.3*  --   --   --   ALBUMIN 2.2* 2.4* 2.5* 2.5* 2.6*   No results for input(s): LIPASE, AMYLASE in the last 168 hours. No results for input(s): AMMONIA in the last 168 hours. Coagulation Profile: No results for input(s): INR, PROTIME in the last 168 hours. Cardiac Enzymes: No results for input(s): CKTOTAL, CKMB, CKMBINDEX, TROPONINI in the last 168 hours. BNP (last 3 results) No results for input(s): PROBNP in the last 8760 hours. HbA1C: No results for input(s): HGBA1C in the last 72 hours.  CBG: Recent Labs  Lab 07/12/24 0614 07/12/24 1147 07/12/24 1619 07/12/24 2237 07/13/24 0608  GLUCAP 131* 200* 214* 191* 187*   Lipid Profile: No results for input(s): CHOL, HDL, LDLCALC, TRIG, CHOLHDL, LDLDIRECT in the last 72 hours. Thyroid  Function Tests: No results for input(s): TSH, T4TOTAL, FREET4, T3FREE, THYROIDAB in the last 72 hours. Anemia Panel: No results for input(s): VITAMINB12, FOLATE, FERRITIN, TIBC, IRON,  RETICCTPCT in the last 72 hours. Urine analysis:    Component Value Date/Time   COLORURINE YELLOW 06/18/2024 1730   APPEARANCEUR CLEAR 06/18/2024 1730   LABSPEC 1.026 06/18/2024 1730   PHURINE 5.0 06/18/2024 1730   GLUCOSEU NEGATIVE 06/18/2024 1730   HGBUR NEGATIVE 06/18/2024 1730   BILIRUBINUR NEGATIVE 06/18/2024 1730   KETONESUR NEGATIVE 06/18/2024 1730   PROTEINUR NEGATIVE 06/18/2024 1730   UROBILINOGEN 0.2 04/13/2014 1612   NITRITE NEGATIVE 06/18/2024 1730   LEUKOCYTESUR NEGATIVE 06/18/2024 1730   Sepsis Labs: @LABRCNTIP (procalcitonin:4,lacticidven:4)  )No results found for this or any previous visit (from the past 240 hours).   Radiology Studies: No results found.   Scheduled Meds:  apixaban   5 mg Oral BID   atorvastatin   80 mg Oral Daily   Chlorhexidine  Gluconate Cloth  6 each Topical Q0600   feeding supplement  237 mL Oral TID BM   guaiFENesin   600 mg Oral BID   insulin  aspart  0-5 Units Subcutaneous QHS   insulin  aspart  0-6 Units Subcutaneous TID WC   insulin  aspart  2 Units Subcutaneous TID WC   insulin  glargine-yfgn  10 Units Subcutaneous Daily   levothyroxine   50 mcg Oral Q0600   midodrine  20 mg Oral Q8H   multivitamin  1 tablet Oral QHS   polyethylene glycol  17 g Oral BID   sodium chloride  flush  10-40 mL Intracatheter Q12H   sodium chloride  flush  3 mL Intravenous Q12H   sodium chloride  flush  3 mL Intravenous Q12H   sodium zirconium cyclosilicate  10 g Oral Once   Continuous Infusions:  sodium chloride  20 mL/hr at 07/13/24 0900   amiodarone 30 mg/hr (07/13/24 0931)   norepinephrine  (LEVOPHED ) Adult infusion 30 mcg/min (07/13/24 0900)   PrismaSol BGK 2/3.5     PrismaSol BGK 2/3.5     PrismaSol BGK 2/3.5        LOS: 26 days    Time spent:    Sigurd Pac, MD [facility]   07/13/2024, 9:58 AM

## 2024-07-13 NOTE — Procedures (Signed)
 07/13/2024 [name]   Procedure: Moderate Sedation (CPT [phone]) for cardioversion  The patient received 2 of versed  and dosages were recorded on the EMR until appropriate level of moderate sedation reached. Monitoring performed as planned in the pre-sedation evaluation.  The patient was recovered from the sedation without complication or incident . Patient returned to pre-sedation level of awareness. The monitoring was discontinued at this time.  Post-anesthesia evaluation:  Consciousness and pulmonary status returned to pre-anesthetic state.  [name], MD

## 2024-07-13 NOTE — Procedures (Signed)
 07/13/2024 Critical Care Medicine Pre Moderate Sedation Evaluation  [name]  Procedure needing sedation:  cardioversion   Relevant medical and surgical history reviewed Current meds reviewed  Exam ENT: malampatti 2, trachea midline LUNGS: nonlabored, clear to auscultation HEART: tachycardic, irregular iregular  MODERATE SEDATION MONITORING Bedside direct observation by PCCM provider Telemetry Pulse oximetry Q18min BP checks EtCO2:   [name], MD [facility] Pulmonary Critical Care Medicine

## 2024-07-13 NOTE — Progress Notes (Signed)
 This chaplain responded to PMT NP-Alicia consult for Pt. spiritual care in the setting of major life transition and prayer.  The Pt. son-Brooks and daughter in law-Alicia are visiting at the bedside.  Through reflective listening with the Pt. son-Brooks, the chaplain understands the Pt.'s family are serving in ministry as the Pt. pastors. The chaplain understands the family invitation for Pt. spiritual care is an opportunity for the chaplain to connect with the Pt. spiritually outside of the family.  The Pt. is resting comfortably during the visit. The Pt. accepted the chaplain's introduction and invitation for intercessory prayer. The chaplain understands the Pt. faith tradition is pentecostal. The chaplain will continue rapport building, awaiting the opportunity for relationship building in connection with the Pt. and family.  This chaplain updated the Pt. RN-Nicole and PMT NP-Shae.  Chaplain Leeroy Hummer [phone]

## 2024-07-13 NOTE — Progress Notes (Addendum)
 Advanced Heart Failure Rounding Note  HF Cardiologist: Dr. Cherrie Chief Complaint: CHF Patient Profile     73 y/o F seen earlier this admission w/ cardiogenic shock in setting of Afib, course c/b renal failure now HD dependent.  Had stabilized from HF standpoint with plans for SNF for rehab.  We were asked to see again d/t recurrent Afib with RVR>>hypotension/shock. Started on NE and restarted on CRRT.  Subjective:    Remains in AF, rates in 80s. CVP 10 Now on high dose NE, up 31. 5L off by CRRT (net -2.5L) Plan for DCCV today  Lying in bed. Drowsy. Denies shortness of breath.  Objective:    Weight Range: 85 kg Body mass index is 31.18 kg/m.   Vital Signs:   Temp:  [97 F (36.1 C)-98.6 F (37 C)] 97.1 F (36.2 C) (10/24 0800) Pulse Rate:  [63-128] 92 (10/24 1110) Resp:  [8-31] 10 (10/24 1110) BP: (71-111)/(48-94) 93/66 (10/24 1110) SpO2:  [83 %-100 %] 98 % (10/24 1110) Weight:  [85 kg] 85 kg (10/24 0457) Last BM Date : 07/10/24  Weight change: Filed Weights   07/11/24 1330 07/12/24 0500 07/13/24 0457  Weight: 86.9 kg 86.8 kg 85 kg   Intake/Output:  Intake/Output Summary (Last 24 hours) at 07/13/2024 1115 Last data filed at 07/13/2024 1000 Gross per 24 hour  Intake 1794 ml  Output 4491 ml  Net -2697 ml   Physical Exam   General: Ill appearing. No distress on [place] Cardiac: JVP ~10cm. S1 and S2 present Extremities: Warm and dry.  2+ BLE edema.  Neuro: Alert and oriented x3. Drowsy  Tele: AF with LBBB 80s (personally reviewed)  Labs    CBC Recent Labs    07/12/24 0405 07/13/24 0500  WBC 6.6 7.8  HGB 9.6* 10.5*  HCT 28.2* 30.9*  MCV 79.0* 80.3  PLT 155 151   Basic Metabolic Panel Recent Labs    89/76/74 0405 07/12/24 1608 07/12/24 1709 07/13/24 0500  NA 122* 123* 122* 125*  K 3.4* 6.0* 5.9* 6.2*  CL 89* 89* 88* 90*  CO2 23 23 23 22   GLUCOSE 333* 432* 423* 366*  BUN 19 12 11 9   CREATININE 2.34* 1.92* 1.81* 1.38*  CALCIUM  7.9*  7.9* 7.5* 7.7*  MG 1.9  --   --  2.4  PHOS 2.3* 7.2*  --  7.0*   Liver Function Tests Recent Labs    07/11/24 1752 07/12/24 0405 07/12/24 1608 07/13/24 0500  AST 16  --   --   --   ALT <5  --   --   --   ALKPHOS 50  --   --   --   BILITOT 1.1  --   --   --   PROT 6.3*  --   --   --   ALBUMIN 2.4*   < > 2.5* 2.6*   < > = values in this interval not displayed.    BNP (last 3 results) Recent Labs    06/17/24 1813  BNP 1,998.5*   Medications:    Scheduled Medications:  apixaban   5 mg Oral BID   atorvastatin   80 mg Oral Daily   Chlorhexidine  Gluconate Cloth  6 each Topical Q0600   feeding supplement  237 mL Oral TID BM   insulin  aspart  0-5 Units Subcutaneous QHS   insulin  aspart  0-6 Units Subcutaneous TID WC   insulin  aspart  3 Units Subcutaneous TID WC   insulin  glargine-yfgn  10 Units  Subcutaneous Daily   levothyroxine   50 mcg Oral Q0600   midodrine  20 mg Oral Q8H   multivitamin  1 tablet Oral QHS   polyethylene glycol  17 g Oral BID   sodium chloride  flush  10-40 mL Intracatheter Q12H   sodium chloride  flush  3 mL Intravenous Q12H   sodium chloride  flush  3 mL Intravenous Q12H   sodium zirconium cyclosilicate  10 g Oral Once    Infusions:  sodium chloride  20 mL/hr at 07/13/24 1000   amiodarone 30 mg/hr (07/13/24 1000)   norepinephrine  (LEVOPHED ) Adult infusion 31 mcg/min (07/13/24 1000)   PrismaSol BGK 2/3.5 500 mL/hr at 07/13/24 1013   PrismaSol BGK 2/3.5 400 mL/hr at 07/13/24 1013   PrismaSol BGK 2/3.5 1,500 mL/hr at 07/13/24 1013     PRN Medications: acetaminophen  **OR** acetaminophen , albuterol , bisacodyl, calcium  carbonate, Gerhardt's butt cream, guaiFENesin -dextromethorphan , heparin , loperamide, naLOXone (NARCAN)  injection, ondansetron  **OR** ondansetron  (ZOFRAN ) IV, mouth rinse, senna-docusate, sodium chloride  flush, sodium chloride  flush, traMADol   Patient Profile   [name] is a 73 y.o.  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   Admit with acute on chronic biventricular HF in setting of AF c/b acute renal failure.  Assessment/Plan   Acute on Chronic Biventricular Heart Failure  New onset RV Failure -> cardiogenic shock - Presented in cardiogenic shock, worsening renal failure, afib - echo 9/30: EF 20-25%, mod LVH, D-shaped septum, RV mod reduced, degenerative MV w/ severe MR - RHC 9/30: RA 17, PA 63/35 (46), PCW 40, Fick 5.7/2.8, TD 3.7/1.8, PVR 1.6, PAPi 1.6  - remains on high-dose NE  - add VP - continue CRRT for volume management - continue midodrine 20 mg TID - Still concerned about her ability to function outside the hospital. Considering SNF for rehab on discharge. - Palliative care following, ongoing discussions. Not a candidate for advanced therapies. Patient and her family want to continue with aggressive medical therapy for now.  Persistent Atrial Fibrillation - new onset 8/25 - Continue Eliquis  5 mg BID - 10/5 S/P TEE/DC-CV -->SR. Back in AF with RVR, rates now 90s-low 100s - continue IV amio 30/h - DCCV at bedside today  Severe MR - not noted on echo 5/23 - severe on echo 9/30 with degenerative valve - Not a clip candidate   Acute renal failure - cardio-renal  - CRRT per nephrology,  appreciate assistance  - No significant change in renal recovery.    CAD - LHC in 2015 showed 99% RCA occlusion with collaterals - hs-trop 35>35, No ACS - continue atorva 80 mg daily  Seen by Palliative Care, made DNR after long discussion with patient and family. Want to honor wishes. See note Palliative Care   CRITICAL CARE Performed by: [place] Lee  Total critical care time: 15 minutes  -Critical care time was exclusive of separately billable procedures and treating other patients. -Critical care was necessary to treat or prevent imminent or life-threatening deterioration. -Critical care was  time spent personally by me on the following activities: development of treatment plan with patient and/or surrogate as well as nursing, discussions with consultants, evaluation of patient's response to treatment, examination of patient, obtaining history from patient or surrogate, ordering and performing treatments and interventions, ordering and review of laboratory studies, ordering and review of radiographic studies, pulse oximetry and re-evaluation of patient's condition.  Length of Stay: [address]  [place] Lee,  NP  07/13/2024, 11:15 AM  Advanced Heart Failure Team Pager [phone] (M-F; 7a - 5p)  Please contact [facility] for night-coverage after hours (5p -7a ) and weekends on [email or web address]  Agree with above  Remains lethargic but arousable. On high dose NE and cvvhd. Remains in AFL with RVR. On IV amio and eliquis   General:  Chronically ill appearing. Lethargic HEENT: normal Neck: supple. JVP to jaw  Cor: Irreg tachy  Lungs: clear Abdomen: soft, nontender, nondistended. No hepatosplenomegaly. No bruits or masses. Good bowel sounds. Extremities: no cyanosis, clubbing, rash, 1+ edema Neuro: lethargic but arousable   She is extremely tenuous. Suspect end-stage. Trajectory discussed with family. Want to try to see if getting her back in NSR will help us  wean inotorpes/pressors. Will proceed with DC-CV.   CRITICAL CARE Performed by: Cherrie Sieving  Total critical care time: 45 minutes  Critical care time was exclusive of separately billable procedures and treating other patients.  Critical care was necessary to treat or prevent imminent or life-threatening deterioration.  Critical care was time spent personally by me (independent of midlevel providers or residents) on the following activities: development of treatment plan with patient and/or surrogate as well as nursing, discussions with consultants, evaluation of patient's response to treatment, examination of patient, obtaining  history from patient or surrogate, ordering and performing treatments and interventions, ordering and review of laboratory studies, ordering and review of radiographic studies, pulse oximetry and re-evaluation of patient's condition.  Sieving Cherrie, MD  3:16 PM

## 2024-07-13 NOTE — Progress Notes (Signed)
 Noted that pt is not likely to d/c today to go to first out-pt HD appointment tmrrw, as does not appear medically stable. Have contacted unit, [facility] , to inform pt will likely need to move start date to next Tuesday, will update AVS.   Lavanda Fredrickson Dialysis Navigator [phone]

## 2024-07-13 NOTE — Progress Notes (Signed)
 Nutrition Follow-up  DOCUMENTATION CODES:   Non-severe (moderate) malnutrition in context of chronic illness  INTERVENTION:   Recommend liberalizing diet to Regular but downgrading consistency to Dysphagia 3 (mechanical soft). Discussed with family; pt with some difficulty chewing, especially with fatigue and weakness.   Ensure Plus High Protein po TID, each supplement provides 350 kcal and 20 grams of protein  Recommend utilizing Milk or Ensure with med pass  Continue Renal MVI daily  NUTRITION DIAGNOSIS:   Moderate Malnutrition related to chronic illness as evidenced by mild fat depletion, moderate muscle depletion.  Continues but being addressed  GOAL:   Patient will meet greater than or equal to 90% of their needs  Being addressed  MONITOR:   PO intake, Diet advancement, Labs, Weight trends  REASON FOR ASSESSMENT:   Consult Assessment of nutrition requirement/status (CRRT)  ASSESSMENT:   73 yo female presents with progress weakness, sternal pressure, shortness of breath and decrease intake for many days and admitted for acute on chronic heart failure with respiratory failure with bilateral pleural effusions and hx of untreated OSA, AKI on CKD. PMH includes CAD, A.Fib,  biventricular HFrE s/p BiVen ICD, CKD 3a, DM  09/28 Admitted 09/30 AKI-worsening, CRRT initiated 10/10 CRRT discontinued 10/11 Tolerated iHD (3.5 hours with 1L net UF) 10/15 TDC placed 10/22 CRRT restarted due to hypotension, hypervolemia  Plan for DCCV today Pt remains on CRRT; net UF goal 50 ml/hr today Noted hyperkalemia with K+ 6 range, noted changing to 2K bath, dose of Lokelma. Noted hyponatremic and hypophosphatemic as well Levophed  up to 31, started on vasopressin at 0.03, levophed  now down to 15  Pt sleeping on visit today, family at bedside. Per RN, pt is arousable and was alert enough this AM to take some Breakfast, drank about 50% of Ensure. Family reports the last time pt was eating  really well was right before she was transferred out of 2H to the floor earlier in the admission. Family reports appetite had declined on the floor and currently is very poor. Recorded po intake 10-20% since transfer back to ICU  Pt likes the Ensure, plan to continue for now. Family also mentions that pt has been drinking milk. Discussed utilizing Ensure or Milk for med pass instead of water ; family likes this idea. Discussed with RN  Noted pt had been accepted by [facility] and was supposed to start outpatient iHD 10/23  Labs: Sodium 125 (L) Potassium 6.2 (H) Phosphorus 7.0 (H) Magnesium  2.4 (wdl) CBgs 131-214 (goal 140-180)  Meds:  Midodrine TID Renal MVI SS novolog  novolog  3 units TID with meals Semglee  10 units daily  Diet Order:  RENAL CARB MODIFIED with 1800 mL Fluid Restriction   EDUCATION NEEDS:   Education needs have been addressed  Skin:  Skin Assessment: Skin Integrity Issues: Skin Integrity Issues:: Stage I Stage I: back d/t LDA cord pt was laying on Other: MASD to labia  Last BM:  10/17 type 2 small  Height:   Ht Readings from Last 1 Encounters:  07/06/24 5' 5 (1.651 m)    Weight:   Wt Readings from Last 1 Encounters:  07/13/24 85 kg    BMI:  Body mass index is 31.18 kg/m.  Estimated Nutritional Needs:   Kcal:  1700-1900 kcals  Protein:  100-125 g  Fluid:  1.5L   Betsey Finger MS, RDN, LDN, CNSC Registered Dietitian 3 Clinical Nutrition RD Inpatient Contact Info in Amion

## 2024-07-13 NOTE — Inpatient Diabetes Management (Signed)
 Inpatient Diabetes Program Recommendations  AACE/ADA: New Consensus Statement on Inpatient Glycemic Control (2015)  Target Ranges:  Prepandial:   less than 140 mg/dL      Peak postprandial:   less than 180 mg/dL (1-2 hours)      Critically ill patients:  140 - 180 mg/dL   Lab Results  Component Value Date   GLUCAP 187 (H) 07/13/2024   HGBA1C 7.4 (H) 07/03/2024    Review of Glycemic Control  Latest Reference Range & Units 07/11/24 21:10 07/12/24 06:14 07/12/24 11:47 07/12/24 16:19 07/12/24 22:37 07/13/24 06:08  Glucose-Capillary 70 - 99 mg/dL 865 (H) 868 (H) 799 (H) 214 (H) 191 (H) 187 (H)  (H): Data is abnormally high Diabetes history: Type 2 DM Outpatient Diabetes medications: Jardiance  10 mg every day (NT), Glipizide  5 mg BID (NT), Lantus  20 units BID, Ozempic  1 mg qwk (NT) Current orders for Inpatient glycemic control: Semglee  10 units every day, Novolog  2 units TID, Novolog  0-6 units TID & HS  Inpatient Diabetes Program Recommendations:    Could consider increasing Lantus  to 12 units every day.   Thanks, Tinnie Minus, MSN, RNC-OB Diabetes Coordinator [phone] (8a-5p)

## 2024-07-13 NOTE — Progress Notes (Signed)
 [facility] Progress Note    Assessment/ Plan:   Anuric AKI (not improving) on CKD3b: Likely secondary to new RV heart failure, persistent atrial fibrillation, Cardiorenal Syndrome  -failed high dose diuretic challenge and CRRT initiated mainly for volume  Continued CRRT: 9/30-10/10. S/p TDC placement with IR on 10/15 -no evidence of renal recovery at this junction -HD on TTS schedule typically w/ midodrine support; try to avoid albumin -CLIP: accepted to [facility]TS @ 9:30am, start 10/23 -CRRT restarted 10/22 given hypotension/volume overload, goal CVP 6-7. C/w CRRT today. UFG: decreasing to net neg 50cc/hr. Given hyperkalemia (initially hypokalemic), will change all bags to 2k  Hyponatremia: hypervolemic.  Will attempt to manage with HD/UF, Na up to 125   AoC BiV HF; New onset RV failure: pressors and inotropes weaned, on midodrine 20 TID now.  D/w cardiology in the past- think additional cardiac improvement is unlikely.  Will continue to UF as tolerated with HD   Atrial fibrillation with RVR: S/p cardioversion 10/5.  On eliquis . Back on IV amio. Per cardiology   CAD: On atorvastatin   Hyperkalemia: changing all bags to 2k. Since K is 6.2, will also give a dose of lokelma  Anemia- hgb stable 10.5   Deconditioning - continue with PT/OT.  Awaiting SNF  Discussed with ICU RN.  Ephriam Stank, MD [facility]  Subjective:   Patient seen and examined on CRRT.  Pressor requirements increased. She is drowsy but interactive, she reports that her breathing is a bit better, no other complaints. Per RN, cardioversion planned for today. Pressors: NE Net negative ~2.5 L   Objective:   BP (!) 87/64   Pulse 92   Temp (!) 97.1 F (36.2 C) (Axillary)   Resp (!) 9   Ht 5' 5 (1.651 m)   Wt 85 kg Comment: Pillows subtracted from bed weight manually  SpO2 98%   BMI 31.18 kg/m   Intake/Output Summary (Last 24 hours) at 07/13/2024 0807 Last data  filed at 07/13/2024 0700 Gross per 24 hour  Intake 2271.9 ml  Output 4713 ml  Net -2441.1 ml   Weight change: -1.9 kg  Physical Exam: Gen: ill appearing, NAD CVS: irreg irreg Resp: decreased breath sounds bibasilar Abd: soft Ext: trace pitting dependent edema Neuro: drowsy but arousable/interactive Dialysis access: RIJ TDC in use  Imaging: No results found.  Labs: BMET Recent Labs  Lab 07/09/24 0523 07/10/24 0309 07/11/24 0235 07/11/24 1600 07/11/24 1752 07/12/24 0405 07/12/24 1608 07/12/24 1709 07/13/24 0500  NA 126* 126* 126* 118* 120* 122* 123* 122* 125*  K 4.8 4.6 4.3 3.9 3.9 3.4* 6.0* 5.9* 6.2*  CL 89* 89* 89* 81* 83* 89* 89* 88* 90*  CO2 23 25 25  21* 23 23 23 23 22   GLUCOSE 170* 120* 175* 437* 423* 333* 432* 423* 366*  BUN 33* 41* 32* 31* 29* 19 12 11 9   CREATININE 4.79* 5.17* 4.15* 3.91* 3.69* 2.34* 1.92* 1.81* 1.38*  CALCIUM  8.7* 8.8* 8.2* 7.2* 7.8* 7.9* 7.9* 7.5* 7.7*  PHOS 5.3* 5.3* 3.7 3.2  --  2.3* 7.2*  --  7.0*   CBC Recent Labs  Lab 07/07/24 0850 07/09/24 0523 07/12/24 0405 07/13/24 0500  WBC 7.3 7.1 6.6 7.8  HGB 10.4* 10.2* 9.6* 10.5*  HCT 30.0* 29.4* 28.2* 30.9*  MCV 78.3* 77.6* 79.0* 80.3  PLT 176 168 155 151    Medications:     apixaban   5 mg Oral BID   atorvastatin   80 mg Oral Daily  Chlorhexidine  Gluconate Cloth  6 each Topical Q0600   feeding supplement  237 mL Oral TID BM   guaiFENesin   600 mg Oral BID   insulin  aspart  0-5 Units Subcutaneous QHS   insulin  aspart  0-6 Units Subcutaneous TID WC   insulin  aspart  2 Units Subcutaneous TID WC   insulin  glargine-yfgn  10 Units Subcutaneous Daily   levothyroxine   50 mcg Oral Q0600   midodrine  20 mg Oral Q8H   multivitamin  1 tablet Oral QHS   polyethylene glycol  17 g Oral BID   sodium chloride  flush  10-40 mL Intracatheter Q12H   sodium chloride  flush  3 mL Intravenous Q12H   sodium chloride  flush  3 mL Intravenous Q12H      Ephriam Stank, MD [facility] 07/13/2024, 8:07 AM

## 2024-07-13 NOTE — TOC Progression Note (Signed)
 Transition of Care [facility]) - Progression Note    Patient Details  Name: [name] MRN: [id number] Date of Birth: 08-Dec-1950  [facility]) CM/SW Contact  Justina Delcia Czar, RN Phone Number: [phone] 07/13/2024, 2:35 PM  Clinical Narrative:     Patient remains on Levophed  and amio gtt. Plan is SNF rehab. HF CSW following for SNF rehab.   Chart reviewed for discharge readiness, patient not medically stable for d/c. Inpatient CM/CSW will continue to monitor pt's advancement through interdisciplinary progression rounds.   If new pt transition needs arise, MD please place a TOC consult.    Expected Discharge Plan: Skilled Nursing Facility Barriers to Discharge: Continued Medical Work up               Expected Discharge Plan and Services   Discharge Planning Services: CM Consult Post Acute Care Choice: Skilled Nursing Facility Living arrangements for the past 2 months: Single Family Home                                       Social Drivers of Health (SDOH) Interventions SDOH Screenings   Food Insecurity: No Food Insecurity (06/18/2024)  Recent Concern: Food Insecurity - Food Insecurity Present (04/23/2024)  Housing: Low Risk  (06/18/2024)  Transportation Needs: No Transportation Needs (06/18/2024)  Utilities: Not At Risk (06/18/2024)  Alcohol Screen: Low Risk  (03/21/2024)  Depression (PHQ2-9): Low Risk  (04/23/2024)  Financial Resource Strain: Low Risk  (03/21/2024)  Physical Activity: Sufficiently Active (03/21/2024)  Social Connections: Moderately Integrated (06/18/2024)  Stress: No Stress Concern Present (03/21/2024)  Tobacco Use: Medium Risk (06/17/2024)  Health Literacy: Adequate Health Literacy (03/21/2024)    Readmission Risk Interventions     No data to display

## 2024-07-13 NOTE — CV Procedure (Signed)
    DIRECT CURRENT CARDIOVERSION  NAME:  [name]   MRN: [id number] DOB:  Jan 20, 1951   ADMIT DATE: 06/17/2024   INDICATIONS: Atrial flutter   PROCEDURE:   Informed consent was obtained prior to the procedure. The risks, benefits and alternatives for the procedure were discussed and the patient comprehended these risks. Once an appropriate time out was taken, the patient had the defibrillator pads placed in the anterior and posterior position. The patient then underwent sedation by the CCM service. Once an appropriate level of sedation was achieved, the patient received a single biphasic, synchronized 150J shock with prompt conversion to sinus rhythm. No apparent complications.  Toribio Fuel, MD  1:04 PM

## 2024-07-13 NOTE — Progress Notes (Signed)
 Daily Progress Note   Patient Name: [name]       Date: 07/13/2024 DOB: [birth year]  Age: 73 y.o. MRN#: [id number] Attending Physician: Fairy Frames, MD Primary Care Physician: Tobie Suzzane POUR, MD Admit Date: 06/17/2024  Reason for Consultation/Follow-up: Establishing goals of care  Subjective: Sleeping soundly - received versed  for cardioversion prior to my visit  Length of Stay: 26  Current Medications: Scheduled Meds:   apixaban   5 mg Oral BID   atorvastatin   80 mg Oral Daily   Chlorhexidine  Gluconate Cloth  6 each Topical Q0600   feeding supplement  237 mL Oral TID BM   insulin  aspart  0-5 Units Subcutaneous QHS   insulin  aspart  0-6 Units Subcutaneous TID WC   insulin  aspart  3 Units Subcutaneous TID WC   insulin  glargine-yfgn  10 Units Subcutaneous Daily   levothyroxine   50 mcg Oral Q0600   midodrine  20 mg Oral Q8H   multivitamin  1 tablet Oral QHS   polyethylene glycol  17 g Oral BID   sodium chloride  flush  10-40 mL Intracatheter Q12H   sodium chloride  flush  3 mL Intravenous Q12H   sodium chloride  flush  3 mL Intravenous Q12H    Continuous Infusions:  sodium chloride  20 mL/hr at 07/13/24 1500   amiodarone 30 mg/hr (07/13/24 1500)   norepinephrine  (LEVOPHED ) Adult infusion 18 mcg/min (07/13/24 1500)   PrismaSol BGK 2/3.5 500 mL/hr at 07/13/24 1013   PrismaSol BGK 2/3.5 400 mL/hr at 07/13/24 1013   PrismaSol BGK 2/3.5 1,500 mL/hr at 07/13/24 1013   vasopressin 0.03 Units/min (07/13/24 1500)    PRN Meds: acetaminophen  **OR** acetaminophen , albuterol , bisacodyl, calcium  carbonate, Gerhardt's butt cream, guaiFENesin -dextromethorphan , heparin , loperamide, naLOXone (NARCAN)  injection, ondansetron  **OR** ondansetron  (ZOFRAN ) IV, mouth rinse, senna-docusate, sodium chloride   flush, sodium chloride  flush, traMADol   Physical Exam Constitutional:      General: She is not in acute distress.    Appearance: She is ill-appearing.     Comments: Sleeping soundly  Pulmonary:     Effort: Pulmonary effort is normal.  Skin:    General: Skin is warm and dry.             Vital Signs: BP 97/61   Pulse 85   Temp (!) 97.1 F (36.2 C) (Axillary)   Resp 10   Ht 5' 5 (1.651 m)   Wt 85 kg Comment: Pillows subtracted from bed weight manually  SpO2 98%   BMI 31.18 kg/m  SpO2: SpO2: 98 % O2 Device: O2 Device: Nasal Cannula O2 Flow Rate: O2 Flow Rate (L/min): 4 L/min  Intake/output summary:  Intake/Output Summary (Last 24 hours) at 07/13/2024 1543 Last data filed at 07/13/2024 1500 Gross per 24 hour  Intake 1820.93 ml  Output 4160 ml  Net -2339.07 ml   LBM: Last BM Date : 07/10/24 Baseline Weight: Weight: 97.8 kg Most recent weight: Weight: 85 kg (Pillows subtracted from bed weight manually)     Patient Active Problem List   Diagnosis Date Noted   Pressure injury of skin 07/06/2024   Chronic kidney disease, stage 3b (HCC) 07/06/2024   Chronic anemia 07/06/2024   Malnutrition of moderate degree 06/22/2024   Hypotension  06/18/2024   AKI (acute kidney injury) 06/18/2024   Acute on chronic congestive heart failure (HCC) 06/18/2024   Acute on chronic systolic CHF (congestive heart failure) (HCC) 06/17/2024   Paroxysmal atrial fibrillation (HCC) 06/12/2024   Acute on chronic combined systolic and diastolic congestive heart failure (HCC) 12/13/2023   Hospital discharge follow-up 12/13/2023   Dyspnea 12/13/2023   Influenza 11/17/2023   Type 2 diabetes mellitus with hyperglycemia, with long-term current use of insulin  (HCC) 08/04/2023   Acute mucoid otitis media of both ears 08/04/2023   Type 2 diabetes mellitus with hyperlipidemia (HCC) 06/17/2023   URTI (acute upper respiratory infection) 09/27/2022   Gait abnormality 09/27/2022   OSA (obstructive sleep  apnea) 05/01/2020   Obesity 05/01/2020   Hypothyroidism 05/01/2020   Cardiomyopathy, ischemic 04/04/2014   Essential hypertension 04/04/2014   Chronic combined systolic and diastolic CHF (congestive heart failure) (HCC) 04/04/2014   Coronary artery disease 03/31/2014   Type 2 diabetes mellitus with diabetic neuropathy (HCC) 03/31/2014   DCM (dilated cardiomyopathy) (HCC) 03/29/2014   Anemia 03/26/2014   Cardiogenic shock (HCC) 03/23/2014   Consolidation of right lower lobe of lung 03/23/2014   Gastroparesis 02/14/2014   Chronic constipation 11/30/2012   GERD (gastroesophageal reflux disease) 11/30/2012   Fatty liver 11/30/2012    Palliative Care Assessment & Plan   HPI: 73 y.o. female admitted on 06/17/2024 with medical history significant for CAD, persistent atrial fibrillation on Eliquis , chronic HFmrEF s/p CRT-D, LBBB, T2DM, HTN, HLD, CKD stage IIIa, hypothyroidism, OSA who presented to the ED for evaluation of chest pain, dyspnea, cough, increased swelling bilateral lower extremities. Patient remains on CRRT and per nephrology would not be able to tolerate iHD due to need for multiple pressors. 10/9 CRRT stopped. Transition to [facility] 10/11. Decline with AF RVR and hypotension 10/22 resulting in return to ICU with initiation of vasopressor support and CRRT again.   Assessment: Follow-up today with [name]. Prior to my visit [name] underwent cardioversion and received 2 mg of Versed .  RN reports that she has been sleeping soundly since that time and only wakes briefly to voice.  Unable to participate in conversation today unfortunately. I met with patient's son [name] and daughter-in-law [name] who are sitting at her bedside.  We reviewed the patient's hospital course and events today.  They understand situation and even understand poor prognosis.  They share that patient has expressed she wants to continue current measures and so they will support her in that wish until/if she changes  her mind. We reviewed that CODE STATUS was changed to DNR, intubation okay.  They share their conversation with her last night and how that decision was made. We reviewed that the palliative team will continue to check in with [name] throughout hospitalization, hopefully she will be able to engage in conversation more over the weekend.  All questions/concerns addressed. Emotional support provided. Updated RN and medical team.     Recommendations/Plan: Ongoing goals of care discussions -PMT will follow Continue chaplain support  Code Status: DNR  Care plan was discussed with RN and patient's family  Thank you for allowing the Palliative Medicine Team to assist in the care of this patient.   Total Time 30 minutes Prolonged Time Billed  no   Time spent includes: Detailed review of medical records (labs, imaging, vital signs), medically appropriate exam, discussion with treatment team, counseling and educating patient, family and/or staff, documenting clinical information, medication management and coordination of care.     *Please  note that this is a verbal dictation therefore any spelling or grammatical errors are due to the Dragon Medical One system interpretation.  Tobey Jama Barnacle, DNP, [facility] Palliative Medicine Team Team Phone # [phone]  Pager [phone]

## 2024-07-14 DIAGNOSIS — I5082 Biventricular heart failure: Secondary | ICD-10-CM | POA: Diagnosis not present

## 2024-07-14 DIAGNOSIS — Z7189 Other specified counseling: Secondary | ICD-10-CM | POA: Diagnosis not present

## 2024-07-14 DIAGNOSIS — I4819 Other persistent atrial fibrillation: Secondary | ICD-10-CM | POA: Diagnosis not present

## 2024-07-14 DIAGNOSIS — Z515 Encounter for palliative care: Secondary | ICD-10-CM | POA: Diagnosis not present

## 2024-07-14 DIAGNOSIS — R570 Cardiogenic shock: Secondary | ICD-10-CM | POA: Diagnosis not present

## 2024-07-14 DIAGNOSIS — I5023 Acute on chronic systolic (congestive) heart failure: Secondary | ICD-10-CM | POA: Diagnosis not present

## 2024-07-14 LAB — POCT I-STAT 7, (LYTES, BLD GAS, ICA,H+H)
Acid-base deficit: 2 mmol/L (ref 0.0–2.0)
Acid-base deficit: 2 mmol/L (ref 0.0–2.0)
Bicarbonate: 26.2 mmol/L (ref 20.0–28.0)
Bicarbonate: 25.3 mmol/L (ref 20.0–28.0)
Calcium, Ion: 1.35 mmol/L (ref 1.15–1.40)
Calcium, Ion: 1.28 mmol/L (ref 1.15–1.40)
HCT: 38 % (ref 36.0–46.0)
HCT: 38 % (ref 36.0–46.0)
Hemoglobin: 12.9 g/dL (ref 12.0–15.0)
Hemoglobin: 12.9 g/dL (ref 12.0–15.0)
O2 Saturation: 90 %
O2 Saturation: 98 %
Patient temperature: 97.6
Patient temperature: 97.09
Potassium: 3.5 mmol/L (ref 3.5–5.1)
Potassium: 4 mmol/L (ref 3.5–5.1)
Sodium: 133 mmol/L — ABNORMAL LOW (ref 135–145)
Sodium: 133 mmol/L — ABNORMAL LOW (ref 135–145)
TCO2: 28 mmol/L (ref 22–32)
TCO2: 27 mmol/L (ref 22–32)
pCO2 arterial: 59.3 mmHg — ABNORMAL HIGH (ref 32–48)
pCO2 arterial: 53.3 mmHg — ABNORMAL HIGH (ref 32–48)
pH, Arterial: 7.251 — ABNORMAL LOW (ref 7.35–7.45)
pH, Arterial: 7.28 — ABNORMAL LOW (ref 7.35–7.45)
pO2, Arterial: 68 mmHg — ABNORMAL LOW (ref 83–108)
pO2, Arterial: 112 mmHg — ABNORMAL HIGH (ref 83–108)

## 2024-07-14 LAB — CBC
HCT: 28.3 % — ABNORMAL LOW (ref 36.0–46.0)
Hemoglobin: 9.6 g/dL — ABNORMAL LOW (ref 12.0–15.0)
MCH: 27.7 pg (ref 26.0–34.0)
MCHC: 33.9 g/dL (ref 30.0–36.0)
MCV: 81.6 fL (ref 80.0–100.0)
Platelets: 132 K/uL — ABNORMAL LOW (ref 150–400)
RBC: 3.47 MIL/uL — ABNORMAL LOW (ref 3.87–5.11)
RDW: 21.3 % — ABNORMAL HIGH (ref 11.5–15.5)
WBC: 8.1 K/uL (ref 4.0–10.5)
nRBC: 0.7 % — ABNORMAL HIGH (ref 0.0–0.2)

## 2024-07-14 LAB — RENAL FUNCTION PANEL
Albumin: 2.5 g/dL — ABNORMAL LOW (ref 3.5–5.0)
Albumin: 2.6 g/dL — ABNORMAL LOW (ref 3.5–5.0)
Anion gap: 10 (ref 5–15)
Anion gap: 11 (ref 5–15)
BUN: 7 mg/dL — ABNORMAL LOW (ref 8–23)
BUN: 7 mg/dL — ABNORMAL LOW (ref 8–23)
CO2: 24 mmol/L (ref 22–32)
CO2: 24 mmol/L (ref 22–32)
Calcium: 8.7 mg/dL — ABNORMAL LOW (ref 8.9–10.3)
Calcium: 8.4 mg/dL — ABNORMAL LOW (ref 8.9–10.3)
Chloride: 93 mmol/L — ABNORMAL LOW (ref 98–111)
Chloride: 93 mmol/L — ABNORMAL LOW (ref 98–111)
Creatinine, Ser: 1.1 mg/dL — ABNORMAL HIGH (ref 0.44–1.00)
Creatinine, Ser: 1.09 mg/dL — ABNORMAL HIGH (ref 0.44–1.00)
GFR, Estimated: 52 mL/min — ABNORMAL LOW (ref >60)
GFR, Estimated: 53 mL/min — ABNORMAL LOW (ref >60)
Glucose, Bld: 330 mg/dL — ABNORMAL HIGH (ref 70–99)
Glucose, Bld: 330 mg/dL — ABNORMAL HIGH (ref 70–99)
Phosphorus: 2 mg/dL — ABNORMAL LOW (ref 2.5–4.6)
Phosphorus: 8.5 mg/dL — ABNORMAL HIGH (ref 2.5–4.6)
Potassium: 3.3 mmol/L — ABNORMAL LOW (ref 3.5–5.1)
Potassium: 6.6 mmol/L (ref 3.5–5.1)
Sodium: 127 mmol/L — ABNORMAL LOW (ref 135–145)
Sodium: 128 mmol/L — ABNORMAL LOW (ref 135–145)

## 2024-07-14 LAB — GLUCOSE, CAPILLARY
Glucose-Capillary: 173 mg/dL — ABNORMAL HIGH (ref 70–99)
Glucose-Capillary: 171 mg/dL — ABNORMAL HIGH (ref 70–99)
Glucose-Capillary: 203 mg/dL — ABNORMAL HIGH (ref 70–99)
Glucose-Capillary: 212 mg/dL — ABNORMAL HIGH (ref 70–99)

## 2024-07-14 LAB — COOXEMETRY PANEL
Carboxyhemoglobin: 1.3 % (ref 0.5–1.5)
Methemoglobin: 1.7 % — ABNORMAL HIGH (ref 0.0–1.5)
O2 Saturation: 64.7 %
Total hemoglobin: 10.2 g/dL — ABNORMAL LOW (ref 12.0–16.0)

## 2024-07-14 LAB — MAGNESIUM: Magnesium: 2 mg/dL (ref 1.7–2.4)

## 2024-07-14 MED ORDER — PRISMASOL BGK 2/3.5 32-2-3.5 MEQ/L EC SOLN: Status: DC

## 2024-07-14 MED ORDER — PRISMASOL BGK 4/2.5 32-4-2.5 MEQ/L EC SOLN
Status: DC
Start: 2024-07-14 — End: 2024-07-14

## 2024-07-14 MED ORDER — SORBITOL 70 % SOLN
30.0000 mL | Freq: Once | Status: AC
  Administered 2024-07-14: 30 mL via ORAL
  Filled 2024-07-14: qty 30

## 2024-07-14 MED ORDER — PRISMASOL BGK 4/2.5 32-4-2.5 MEQ/L EC SOLN: Status: DC

## 2024-07-14 MED ORDER — POTASSIUM PHOSPHATES 15 MMOLE/5ML IV SOLN
15.0000 mmol | Freq: Once | INTRAVENOUS | Status: AC
  Administered 2024-07-14: 15 mmol via INTRAVENOUS
  Filled 2024-07-14: qty 5

## 2024-07-14 MED ORDER — PRISMASOL BGK 2/3.5 32-2-3.5 MEQ/L EC SOLN
Status: DC
Start: 2024-07-14 — End: 2024-07-15

## 2024-07-14 NOTE — Progress Notes (Signed)
 Daily Progress Note   Patient Name: [name]       Date: 07/14/2024 DOB: 04-13-1951  Age: 73 y.o. MRN#: [id number] Attending Physician: Fairy Frames, MD Primary Care Physician: Tobie Suzzane POUR, MD Admit Date: 06/17/2024  Reason for Consultation/Follow-up: Establishing goals of care  Length of Stay: 27  Current Medications: Scheduled Meds:   apixaban   5 mg Oral BID   atorvastatin   80 mg Oral Daily   Chlorhexidine  Gluconate Cloth  6 each Topical Q0600   feeding supplement  237 mL Oral TID BM   insulin  aspart  0-5 Units Subcutaneous QHS   insulin  aspart  0-6 Units Subcutaneous TID WC   insulin  aspart  3 Units Subcutaneous TID WC   insulin  glargine-yfgn  10 Units Subcutaneous Daily   levothyroxine   50 mcg Oral Q0600   midodrine  20 mg Oral Q8H   multivitamin  1 tablet Oral QHS   polyethylene glycol  17 g Oral BID   sodium chloride  flush  10-40 mL Intracatheter Q12H   sodium chloride  flush  3 mL Intravenous Q12H   sodium chloride  flush  3 mL Intravenous Q12H    Continuous Infusions:  amiodarone 30 mg/hr (07/14/24 1200)   norepinephrine  (LEVOPHED ) Adult infusion 14 mcg/min (07/14/24 1200)   potassium PHOSPHATE IVPB (in mmol) 43 mL/hr at 07/14/24 1200   prismasol BGK 4/2.5 400 mL/hr at 07/14/24 0924   prismasol BGK 4/2.5 1,000 mL/hr at 07/14/24 1206   prismasol BGK 4/2.5 400 mL/hr at 07/14/24 9081   vasopressin 0.03 Units/min (07/14/24 1200)    PRN Meds: acetaminophen  **OR** acetaminophen , albuterol , bisacodyl, calcium  carbonate, Gerhardt's butt cream, guaiFENesin -dextromethorphan , heparin , loperamide, naLOXone (NARCAN)  injection, ondansetron  **OR** ondansetron  (ZOFRAN ) IV, mouth rinse, senna-docusate, sodium chloride  flush, sodium chloride  flush, traMADol   Physical  Exam Vitals reviewed.  Constitutional:      General: She is sleeping. She is not in acute distress.    Appearance: She is ill-appearing.  HENT:     Head: Normocephalic and atraumatic.  Cardiovascular:     Rate and Rhythm: Normal rate.  Pulmonary:     Effort: Pulmonary effort is normal.  Skin:    General: Skin is warm and dry.  Neurological:     Mental Status: She is oriented to person, place, and time. She is lethargic.  Vital Signs: BP 92/60   Pulse 83   Temp 97.9 F (36.6 C) (Oral)   Resp 18   Ht 5' 5 (1.651 m)   Wt 84.5 kg Comment: Manually subtracted weight of pillows  SpO2 99%   BMI 31.00 kg/m  SpO2: SpO2: 99 % O2 Device: O2 Device: Bi-PAP O2 Flow Rate: O2 Flow Rate (L/min): 3 L/min    Patient Active Problem List   Diagnosis Date Noted   Pressure injury of skin 07/06/2024   Chronic kidney disease, stage 3b (HCC) 07/06/2024   Chronic anemia 07/06/2024   Malnutrition of moderate degree 06/22/2024   Hypotension 06/18/2024   AKI (acute kidney injury) 06/18/2024   Acute on chronic congestive heart failure (HCC) 06/18/2024   Acute on chronic systolic CHF (congestive heart failure) (HCC) 06/17/2024   Paroxysmal atrial fibrillation (HCC) 06/12/2024   Acute on chronic combined systolic and diastolic congestive heart failure (HCC) 12/13/2023   Hospital discharge follow-up 12/13/2023   Dyspnea 12/13/2023   Influenza 11/17/2023   Type 2 diabetes mellitus with hyperglycemia, with long-term current use of insulin  (HCC) 08/04/2023   Acute mucoid otitis media of both ears 08/04/2023   Type 2 diabetes mellitus with hyperlipidemia (HCC) 06/17/2023   URTI (acute upper respiratory infection) 09/27/2022   Gait abnormality 09/27/2022   OSA (obstructive sleep apnea) 05/01/2020   Obesity 05/01/2020   Hypothyroidism 05/01/2020   Cardiomyopathy, ischemic 04/04/2014   Essential hypertension 04/04/2014   Chronic combined systolic and diastolic CHF (congestive heart  failure) (HCC) 04/04/2014   Coronary artery disease 03/31/2014   Type 2 diabetes mellitus with diabetic neuropathy (HCC) 03/31/2014   DCM (dilated cardiomyopathy) (HCC) 03/29/2014   Anemia 03/26/2014   Cardiogenic shock (HCC) 03/23/2014   Consolidation of right lower lobe of lung 03/23/2014   Gastroparesis 02/14/2014   Chronic constipation 11/30/2012   GERD (gastroesophageal reflux disease) 11/30/2012   Fatty liver 11/30/2012    Palliative Care Assessment & Plan   Patient Profile: 73 y.o. female admitted on 06/17/2024 with medical history significant for CAD, persistent atrial fibrillation on Eliquis , chronic HFmrEF s/p CRT-D, LBBB, T2DM, HTN, HLD, CKD stage IIIa, hypothyroidism, OSA who presented to the ED for evaluation of chest pain, dyspnea, cough, increased swelling bilateral lower extremities. Patient remains on CRRT and per nephrology would not be able to tolerate iHD due to need for multiple pressors. 10/9 CRRT stopped. Transition to [facility] 10/11. Decline with AF RVR and hypotension 10/22 resulting in return to ICU with initiation of vasopressor support and CRRT again.   Today's Discussion: Reviewed chart and received updates from nursing and attending provider. Nursing shared patient is being placed on BiPAP. Patient is very sleepy when I tried to speak with her. She is oriented but has a difficult time keeping her eyes open. I told her I would let her sleep but wanted to call her son to check in--- she is okay with this.  1:15 pm: Spoke with patient's son [name] by phone. He will be at hospital shortly. He has no questions or concerns after speaking to PMT yesterday. Plan remains allowing time for outcomes over the weekend.   Encouraged patient's son to call PMT with questions or concerns. PMT will continue to support.  Recommendations/Plan: DNR Ongoing goals of care discussions -PMT will follow Continue chaplain support    Code Status:    Code Status Orders  (From  admission, onward)           Start     Ordered  07/14/24 0947  Do not attempt resuscitation (DNR)- Limited -Do Not Intubate (DNI)  (Code Status)  Continuous       Question Answer Comment  If pulseless and not breathing No CPR or chest compressions.   In Pre-Arrest Conditions (Patient Is Breathing and Has A Pulse) Do not intubate. Provide all appropriate non-invasive medical interventions. Avoid ICU transfer unless indicated or required.   Consent: Discussion documented in EHR or advanced directives reviewed      07/14/24 0946         Extensive chart review has been completed prior to seeing the patient including labs, vital signs, imaging, progress/consult notes, orders, medications, and available advance directive documents.  Care plan was discussed with bedside RN and Dr. Fairy  Time spent: 35 minutes  Thank you for allowing the Palliative Medicine Team to assist in the care of this patient.    Stephane CHRISTELLA Palin, NP  Please contact Palliative Medicine Team phone at [phone] for questions and concerns.

## 2024-07-14 NOTE — Plan of Care (Signed)
  Problem: Fluid Volume: Goal: Ability to maintain a balanced intake and output will improve Outcome: Progressing   Problem: Skin Integrity: Goal: Risk for impaired skin integrity will decrease Outcome: Progressing   

## 2024-07-14 NOTE — Progress Notes (Signed)
 Advanced Heart Failure Rounding Note  HF Cardiologist: Dr. Cherrie Chief Complaint: CHF Patient Profile     73 y/o F seen earlier this admission w/ cardiogenic shock in setting of Afib, course c/b renal failure now HD dependent.  Had stabilized from HF standpoint with plans for SNF for rehab.  We were asked to see again d/t recurrent Afib with RVR>>hypotension/shock. Started on NE and restarted on CRRT.  Subjective:    Underwent repeat DC-CV 10/24  Remains in NSR. NE down 31-> 14 today. On VP 0.03  Remains very lethargic  On CVVHD pulling -50-100  Objective:    Weight Range: 84.5 kg Body mass index is 31 kg/m.   Vital Signs:   Temp:  [97.1 F (36.2 C)-98.5 F (36.9 C)] 97.9 F (36.6 C) (10/25 1100) Pulse Rate:  [78-92] 87 (10/25 1330) Resp:  [7-21] 20 (10/25 1330) BP: (86-110)/(52-79) 95/79 (10/25 1330) SpO2:  [87 %-100 %] 100 % (10/25 1330) FiO2 (%):  [40 %] 40 % (10/25 1219) Weight:  [84.5 kg] 84.5 kg (10/25 0500) Last BM Date : 07/10/24  Weight change: Filed Weights   07/12/24 0500 07/13/24 0457 07/14/24 0500  Weight: 86.8 kg 85 kg 84.5 kg   Intake/Output:  Intake/Output Summary (Last 24 hours) at 07/14/2024 1343 Last data filed at 07/14/2024 1300 Gross per 24 hour  Intake 1708.84 ml  Output 2544 ml  Net -835.16 ml   Physical Exam   General:  elderly lethargic No resp difficulty HEENT: normal Neck: supple. JVP 10.  Rnm:Mzhlojm rate & rhythm. No rubs, gallops or murmurs. Lungs: clear Abdomen: soft, nontender, nondistended. No hepatosplenomegaly. No bruits or masses. Good bowel sounds. Extremities: no cyanosis, clubbing, rash, edema Neuro: lethargic but arousable   Labs    CBC Recent Labs    07/13/24 0500 07/14/24 0500 07/14/24 1021  WBC 7.8 8.1  --   HGB 10.5* 9.6* 12.9  HCT 30.9* 28.3* 38.0  MCV 80.3 81.6  --   PLT 151 132*  --    Basic Metabolic Panel Recent Labs    89/75/74 0500 07/13/24 1547 07/14/24 0500 07/14/24 1021   NA 125* 130* 127* 133*  K 6.2* 3.6 3.3* 3.5  CL 90* 96* 93*  --   CO2 22 24 24   --   GLUCOSE 366* 267* 330*  --   BUN 9 8 7*  --   CREATININE 1.38* 1.25* 1.10*  --   CALCIUM  7.7* 8.2* 8.7*  --   MG 2.4  --  2.0  --   PHOS 7.0* 2.2* 2.0*  --    Liver Function Tests Recent Labs    07/11/24 1752 07/12/24 0405 07/13/24 1547 07/14/24 0500  AST 16  --   --   --   ALT <5  --   --   --   ALKPHOS 50  --   --   --   BILITOT 1.1  --   --   --   PROT 6.3*  --   --   --   ALBUMIN 2.4*   < > 2.5* 2.5*   < > = values in this interval not displayed.    BNP (last 3 results) Recent Labs    06/17/24 1813  BNP 1,998.5*   Medications:    Scheduled Medications:  apixaban   5 mg Oral BID   atorvastatin   80 mg Oral Daily   Chlorhexidine  Gluconate Cloth  6 each Topical Q0600   feeding supplement  237 mL Oral TID  BM   insulin  aspart  0-5 Units Subcutaneous QHS   insulin  aspart  0-6 Units Subcutaneous TID WC   insulin  aspart  3 Units Subcutaneous TID WC   insulin  glargine-yfgn  10 Units Subcutaneous Daily   levothyroxine   50 mcg Oral Q0600   midodrine  20 mg Oral Q8H   multivitamin  1 tablet Oral QHS   polyethylene glycol  17 g Oral BID   sodium chloride  flush  10-40 mL Intracatheter Q12H   sodium chloride  flush  3 mL Intravenous Q12H   sodium chloride  flush  3 mL Intravenous Q12H    Infusions:  amiodarone 30 mg/hr (07/14/24 1300)   norepinephrine  (LEVOPHED ) Adult infusion 14 mcg/min (07/14/24 1300)   potassium PHOSPHATE IVPB (in mmol) 43 mL/hr at 07/14/24 1300   prismasol BGK 4/2.5 400 mL/hr at 07/14/24 0924   prismasol BGK 4/2.5 1,000 mL/hr at 07/14/24 1206   prismasol BGK 4/2.5 400 mL/hr at 07/14/24 9081   vasopressin 0.03 Units/min (07/14/24 1300)     PRN Medications: acetaminophen  **OR** acetaminophen , albuterol , bisacodyl, calcium  carbonate, Gerhardt's butt cream, guaiFENesin -dextromethorphan , heparin , loperamide, naLOXone (NARCAN)  injection, ondansetron  **OR**  ondansetron  (ZOFRAN ) IV, mouth rinse, senna-docusate, sodium chloride  flush, sodium chloride  flush, traMADol   Patient Profile   [name] is a 73 y.o.  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   Admit with acute on chronic biventricular HF in setting of AF c/b acute renal failure.  Assessment/Plan   Acute on Chronic Biventricular Heart Failure  New onset RV Failure -> cardiogenic shock - Presented in cardiogenic shock, worsening renal failure, afib - echo 9/30: EF 20-25%, mod LVH, D-shaped septum, RV mod reduced, degenerative MV w/ severe MR - RHC 9/30: RA 17, PA 63/35 (46), PCW 40, Fick 5.7/2.8, TD 3.7/1.8, PVR 1.6, PAPi 1.6  - s/p repeat DC-CV 10/24 - now on NE 14 and VP 0.03 - continue CRRT for volume management - continue midodrine 20 mg TID - I think she is end-stage - Now full DNR - We will follow through the weekend and see if she is improving after DC-CV. If not, would consider transition to comfort care. D/w Dr. Fairy [facility])  Persistent Atrial Fibrillation - new onset 8/25 - Continue Eliquis  5 mg BID - 10/5 S/P TEE/DC-CV -->SR. Back in AF with RVR, rates now 90s-low 100s - Repeat DC-CV on 10/24 -> NSR - continue IV amio 30/h  Severe MR - not noted on echo 5/23 - severe on echo 9/30 with degenerative valve - Not a clip candidate   Acute renal failure - cardio-renal  - CRRT per nephrology,  appreciate assistance  - remains anuric   CAD - LHC in 2015 showed 99% RCA occlusion with collaterals - hs-trop 35>35, No ACS - continue atorva 80 mg daily - No s/s angina  I spent a total of 37 minutes today: 1) reviewing the patient's medical records including previous charts, labs and recent notes from other providers; 2) examining the patient and counseling them on their medical issues/explaining the plan of care; 3) adjusting meds as needed and 4) ordering  lab work or other needed tests.    Length of Stay: 27  Toribio Fuel, MD  07/14/2024, 1:43 PM  Advanced Heart Failure Team Pager [phone] (M-F; 7a - 5p)  Please contact [facility] for night-coverage after hours (5p -7a ) and weekends on [email or web address]

## 2024-07-14 NOTE — Progress Notes (Addendum)
 [facility] Progress Note    Assessment/ Plan:   Anuric AKI (not improving) on CKD3b: Likely secondary to new RV heart failure, persistent atrial fibrillation, Cardiorenal Syndrome  -failed high dose diuretic challenge and CRRT initiated mainly for volume  Continued CRRT: 9/30-10/10. S/p TDC placement with IR on 10/15 -no evidence of renal recovery at this junction -HD on TTS schedule typically w/ midodrine support; try to avoid albumin -CLIP: accepted to [facility] @ 9:30am, start 10/23 but on hold given that she is back in ICU -CRRT restarted 10/22 given hypotension/volume overload, goal CVP 6-7. C/w CRRT today. UFG: net neg 50cc/hr. HyperK resolved after adjusting bags, back to being hypokalemic, will readjust back to all 4K bags  Hyponatremia: hypervolemic.  Will attempt to manage with HD/UF, Na 127   AoC BiV HF; New onset RV failure, cardiogenic shock -pressor support per primary service, c/w midodrine   Atrial fibrillation with RVR: S/p cardioversion 10/5 & 10/24.  On eliquis  on IV amio. Per cardiology   CAD: On atorvastatin   Hypokalemia- changing all bags back to 4K  Anemia- hgb 9.6, transfuse prn for hgb < 7. Checking iron stores   Deconditioning - per primary service  Palliative care following, she is DNR.  Discussed with ICU RN.  Ephriam Stank, MD [facility]  Addendum 421pm: informed of hyperK, K up to 6.6. readjusted all bags to 2k  Subjective:   Patient seen and examined on CRRT.  Remains drowsy, no complaints Pressors: NE, vaso Net negative ~0.8 L   Objective:   BP 101/60   Pulse 85   Temp (!) 97.5 F (36.4 C) (Axillary)   Resp 10   Ht 5' 5 (1.651 m)   Wt 84.5 kg Comment: Manually subtracted weight of pillows  SpO2 95%   BMI 31.00 kg/m   Intake/Output Summary (Last 24 hours) at 07/14/2024 0838 Last data filed at 07/14/2024 0800 Gross per 24 hour  Intake 1707.72 ml  Output 2416 ml  Net -708.28 ml    Weight change: -0.5 kg  Physical Exam: Gen: ill appearing, NAD CVS: irreg irreg Resp: decreased breath sounds bibasilar, normal wob Abd: soft Ext: trace pitting dependent edema Neuro: drowsy but arousable/interactive Dialysis access: RIJ TDC in use  Imaging: No results found.  Labs: BMET Recent Labs  Lab 07/11/24 0235 07/11/24 1600 07/11/24 1752 07/12/24 0405 07/12/24 1608 07/12/24 1709 07/13/24 0500 07/13/24 1547 07/14/24 0500  NA 126* 118* 120* 122* 123* 122* 125* 130* 127*  K 4.3 3.9 3.9 3.4* 6.0* 5.9* 6.2* 3.6 3.3*  CL 89* 81* 83* 89* 89* 88* 90* 96* 93*  CO2 25 21* 23 23 23 23 22 24 24   GLUCOSE 175* 437* 423* 333* 432* 423* 366* 267* 330*  BUN 32* 31* 29* 19 12 11 9 8  7*  CREATININE 4.15* 3.91* 3.69* 2.34* 1.92* 1.81* 1.38* 1.25* 1.10*  CALCIUM  8.2* 7.2* 7.8* 7.9* 7.9* 7.5* 7.7* 8.2* 8.7*  PHOS 3.7 3.2  --  2.3* 7.2*  --  7.0* 2.2* 2.0*   CBC Recent Labs  Lab 07/09/24 0523 07/12/24 0405 07/13/24 0500 07/14/24 0500  WBC 7.1 6.6 7.8 8.1  HGB 10.2* 9.6* 10.5* 9.6*  HCT 29.4* 28.2* 30.9* 28.3*  MCV 77.6* 79.0* 80.3 81.6  PLT 168 155 151 132*    Medications:     apixaban   5 mg Oral BID   atorvastatin   80 mg Oral Daily   Chlorhexidine  Gluconate Cloth  6 each Topical Q0600   feeding supplement  237 mL Oral TID BM   insulin  aspart  0-5 Units Subcutaneous QHS   insulin  aspart  0-6 Units Subcutaneous TID WC   insulin  aspart  3 Units Subcutaneous TID WC   insulin  glargine-yfgn  10 Units Subcutaneous Daily   levothyroxine   50 mcg Oral Q0600   midodrine  20 mg Oral Q8H   multivitamin  1 tablet Oral QHS   polyethylene glycol  17 g Oral BID   sodium chloride  flush  10-40 mL Intracatheter Q12H   sodium chloride  flush  3 mL Intravenous Q12H   sodium chloride  flush  3 mL Intravenous Q12H      Ephriam Stank, MD [facility] 07/14/2024, 8:38 AM

## 2024-07-14 NOTE — Progress Notes (Signed)
 Date and time results received: 07/14/24 1618  Test: Potassium  Critical Value: 6.6  Name of Provider Notified: Dr. Dennise  Orders Received? Or Actions Taken?: New Orders received to change fluids in CRRT

## 2024-07-14 NOTE — Progress Notes (Addendum)
 PROGRESS NOTE    [name]  Best DOB: 1950/11/16 DOA: 06/17/2024 PCP: Tobie Suzzane POUR, MD  73 y.o. with history of combined systolic and diastolic heart failure s/p BiV ICD, CAD, OSA on bipap, LBBB, HTN, hypotension, and atrial fibrillation.  - Admitted with volume overload, AKI - Echo 9/30 noted EF 20-25%, severe MR, moderately reduced RV - RHC 9/30> elevated PA pressures and filling pressures, PA PI of 1.6 - Treated with inotropes and vasopressors in ICU, now off - Complicated by worsening renal failure, cardiorenal syndrome, failed high-dose diuretic challenge, then started on CRRT from 9/30-10/10 -sp cardioversion for A-fib -Palliative care consulted and following - 10/11 initiated hemodialysis - Transferred out of ICU to [facility] service 10/14 -10/15: Tunneled dialysis catheter placed in IR - Multiple palliative care conversations, full code and full scope - CLIP for HD completed : Accepted to [facility] - 10/21 PM noted to be back in A-fib -10/22: A-fib RVR with hypotension> started on Amio gtt., transferred to ICU, started CRRT for volume overload, started on Norepi - 10/23, CRRT continued, remains on NE -10/24: Vasopressin started  Subjective: - Sleepy this morning but answers questions appropriately  Assessment and Plan:  Acute on chronic biventricular failure Cardiogenic shock -Admitted with cardiogenic shock, worsening renal failure and A-fib - Echo with EF 2025, moderately reduced RV, severe mitral regurgitation -RHC 9/30 noted elevated PA pressures wedge of 40, PA PI 1.6 - Treated with vasopressors, inotropes and high-dose diuretics, TEE/DCCV - With worsening renal failure started CRRT then intermittent hemodialysis since 10/11, continued on high-dose midodrine -Now back on CRRT since 10/22, CVP improving,  -On a NE, 12 mcg and vasopressin> hopefully can wean off this weekend - Palliative care following, prognosis is poor> was limited code, now  agrees to full DNR, called and updated son today recommended comfort care if she doesn't stabilize by Monday  Severe mitral regurgitation -Noted on echo this admission, not felt to be a mitral clip candidate  Paroxysmal atrial fibrillation - New onset in August - Cardioverted 10/5 - was stable in sinus rhythm on amiodarone and Eliquis  - Back in A-fib RVR 10/22, started on Amio gtt., cardioverted again yesterday 10/24  AKI on CKD4 - Cardiorenal, failed high-dose diuretics - Treated with CRRT 9/30-10/10 - Started on intermittent hemodialysis 10/11 - Nephrology following, sp HD Cath and CLIP, -now CRRT as above, volume status improving  Hyponatremia - Stable  CAD -Stable at this time, no ACS on admission, continue atorvastatin   Acute hypoxic respiratory failure - Secondary to above, improving  Type 2 diabetes mellitus - Will continue Semglee , increase meal coverage  Microcytic anemia - Stable, monitor   DVT prophylaxis: Apixaban  Code Status: DNR Family Communication: No family at bedside today, will update son Disposition Plan: To be determined, SNF if she stabilizes and improves    Objective: Vitals:   07/14/24 0915 07/14/24 0930 07/14/24 0945 07/14/24 1000  BP: 101/64 100/61 107/65 101/62  Pulse: 87 87 83 88  Resp: 10 11 13 10   Temp:      TempSrc:      SpO2: 96% 94% 96% 98%  Weight:      Height:        Intake/Output Summary (Last 24 hours) at 07/14/2024 1024 Last data filed at 07/14/2024 1000 Gross per 24 hour  Intake 1662.23 ml  Output 2551 ml  Net -888.77 ml   Filed Weights   07/12/24 0500 07/13/24 0457 07/14/24 0500  Weight: 86.8 kg 85 kg 84.5 kg  Examination:   Chronically ill, somnolent this morning, arousable answers questions appropriately HEENT: Right IJ HD catheter, + JVD CVS: S1-S2, irregular rhythm Lungs: Decreased breath sounds at the bases Abdomen: Soft, nontender bowel sounds present Remedies: Trace edema, cold  extremities   Data Reviewed:   CBC: Recent Labs  Lab 07/09/24 0523 07/12/24 0405 07/13/24 0500 07/14/24 0500 07/14/24 1021  WBC 7.1 6.6 7.8 8.1  --   HGB 10.2* 9.6* 10.5* 9.6* 12.9  HCT 29.4* 28.2* 30.9* 28.3* 38.0  MCV 77.6* 79.0* 80.3 81.6  --   PLT 168 155 151 132*  --    Basic Metabolic Panel: Recent Labs  Lab 07/12/24 0405 07/12/24 1608 07/12/24 1709 07/13/24 0500 07/13/24 1547 07/14/24 0500 07/14/24 1021  NA 122* 123* 122* 125* 130* 127* 133*  K 3.4* 6.0* 5.9* 6.2* 3.6 3.3* 3.5  CL 89* 89* 88* 90* 96* 93*  --   CO2 23 23 23 22 24 24   --   GLUCOSE 333* 432* 423* 366* 267* 330*  --   BUN 19 12 11 9 8  7*  --   CREATININE 2.34* 1.92* 1.81* 1.38* 1.25* 1.10*  --   CALCIUM  7.9* 7.9* 7.5* 7.7* 8.2* 8.7*  --   MG 1.9  --   --  2.4  --  2.0  --   PHOS 2.3* 7.2*  --  7.0* 2.2* 2.0*  --    GFR: Estimated Creatinine Clearance: 47.4 mL/min (A) (by C-G formula based on SCr of 1.1 mg/dL (H)). Liver Function Tests: Recent Labs  Lab 07/11/24 1752 07/12/24 0405 07/12/24 1608 07/13/24 0500 07/13/24 1547 07/14/24 0500  AST 16  --   --   --   --   --   ALT <5  --   --   --   --   --   ALKPHOS 50  --   --   --   --   --   BILITOT 1.1  --   --   --   --   --   PROT 6.3*  --   --   --   --   --   ALBUMIN 2.4* 2.5* 2.5* 2.6* 2.5* 2.5*   No results for input(s): LIPASE, AMYLASE in the last 168 hours. No results for input(s): AMMONIA in the last 168 hours. Coagulation Profile: No results for input(s): INR, PROTIME in the last 168 hours. Cardiac Enzymes: No results for input(s): CKTOTAL, CKMB, CKMBINDEX, TROPONINI in the last 168 hours. BNP (last 3 results) No results for input(s): PROBNP in the last 8760 hours. HbA1C: No results for input(s): HGBA1C in the last 72 hours.  CBG: Recent Labs  Lab 07/12/24 2237 07/13/24 0608 07/13/24 1123 07/13/24 1626 07/13/24 2109  GLUCAP 191* 187* 179* 154* 171*   Lipid Profile: No results for input(s):  CHOL, HDL, LDLCALC, TRIG, CHOLHDL, LDLDIRECT in the last 72 hours. Thyroid  Function Tests: No results for input(s): TSH, T4TOTAL, FREET4, T3FREE, THYROIDAB in the last 72 hours. Anemia Panel: No results for input(s): VITAMINB12, FOLATE, FERRITIN, TIBC, IRON, RETICCTPCT in the last 72 hours. Urine analysis:    Component Value Date/Time   COLORURINE YELLOW 06/18/2024 1730   APPEARANCEUR CLEAR 06/18/2024 1730   LABSPEC 1.026 06/18/2024 1730   PHURINE 5.0 06/18/2024 1730   GLUCOSEU NEGATIVE 06/18/2024 1730   HGBUR NEGATIVE 06/18/2024 1730   BILIRUBINUR NEGATIVE 06/18/2024 1730   KETONESUR NEGATIVE 06/18/2024 1730   PROTEINUR NEGATIVE 06/18/2024 1730   UROBILINOGEN 0.2 04/13/2014 1612  NITRITE NEGATIVE 06/18/2024 1730   LEUKOCYTESUR NEGATIVE 06/18/2024 1730   Sepsis Labs: @LABRCNTIP (procalcitonin:4,lacticidven:4)  )No results found for this or any previous visit (from the past 240 hours).   Radiology Studies: No results found.   Scheduled Meds:  apixaban   5 mg Oral BID   atorvastatin   80 mg Oral Daily   Chlorhexidine  Gluconate Cloth  6 each Topical Q0600   feeding supplement  237 mL Oral TID BM   insulin  aspart  0-5 Units Subcutaneous QHS   insulin  aspart  0-6 Units Subcutaneous TID WC   insulin  aspart  3 Units Subcutaneous TID WC   insulin  glargine-yfgn  10 Units Subcutaneous Daily   levothyroxine   50 mcg Oral Q0600   midodrine  20 mg Oral Q8H   multivitamin  1 tablet Oral QHS   polyethylene glycol  17 g Oral BID   sodium chloride  flush  10-40 mL Intracatheter Q12H   sodium chloride  flush  3 mL Intravenous Q12H   sodium chloride  flush  3 mL Intravenous Q12H   Continuous Infusions:  amiodarone 30 mg/hr (07/14/24 1000)   norepinephrine  (LEVOPHED ) Adult infusion 14 mcg/min (07/14/24 1000)   potassium PHOSPHATE IVPB (in mmol) 43 mL/hr at 07/14/24 1000   prismasol BGK 4/2.5 400 mL/hr at 07/14/24 0924   prismasol BGK 4/2.5 1,000 mL/hr at  07/14/24 0921   prismasol BGK 4/2.5 400 mL/hr at 07/14/24 9081   vasopressin 0.03 Units/min (07/14/24 1000)      LOS: 27 days    Time spent:    Sigurd Pac, MD [facility]   07/14/2024, 10:24 AM

## 2024-07-15 DIAGNOSIS — R570 Cardiogenic shock: Secondary | ICD-10-CM | POA: Diagnosis not present

## 2024-07-15 DIAGNOSIS — I5023 Acute on chronic systolic (congestive) heart failure: Secondary | ICD-10-CM | POA: Diagnosis not present

## 2024-07-15 DIAGNOSIS — I5082 Biventricular heart failure: Secondary | ICD-10-CM | POA: Diagnosis not present

## 2024-07-15 DIAGNOSIS — I4819 Other persistent atrial fibrillation: Secondary | ICD-10-CM | POA: Diagnosis not present

## 2024-07-15 LAB — RENAL FUNCTION PANEL
Albumin: 2.6 g/dL — ABNORMAL LOW (ref 3.5–5.0)
Albumin: 2.6 g/dL — ABNORMAL LOW (ref 3.5–5.0)
Anion gap: 10 (ref 5–15)
Anion gap: 10 (ref 5–15)
BUN: 9 mg/dL (ref 8–23)
BUN: 8 mg/dL (ref 8–23)
CO2: 24 mmol/L (ref 22–32)
CO2: 23 mmol/L (ref 22–32)
Calcium: 9.4 mg/dL (ref 8.9–10.3)
Calcium: 8.9 mg/dL (ref 8.9–10.3)
Chloride: 96 mmol/L — ABNORMAL LOW (ref 98–111)
Chloride: 96 mmol/L — ABNORMAL LOW (ref 98–111)
Creatinine, Ser: 1.1 mg/dL — ABNORMAL HIGH (ref 0.44–1.00)
Creatinine, Ser: 1.08 mg/dL — ABNORMAL HIGH (ref 0.44–1.00)
GFR, Estimated: 52 mL/min — ABNORMAL LOW (ref >60)
GFR, Estimated: 54 mL/min — ABNORMAL LOW (ref >60)
Glucose, Bld: 216 mg/dL — ABNORMAL HIGH (ref 70–99)
Glucose, Bld: 282 mg/dL — ABNORMAL HIGH (ref 70–99)
Phosphorus: 1.7 mg/dL — ABNORMAL LOW (ref 2.5–4.6)
Phosphorus: 2.5 mg/dL (ref 2.5–4.6)
Potassium: 3.3 mmol/L — ABNORMAL LOW (ref 3.5–5.1)
Potassium: 3.8 mmol/L (ref 3.5–5.1)
Sodium: 130 mmol/L — ABNORMAL LOW (ref 135–145)
Sodium: 129 mmol/L — ABNORMAL LOW (ref 135–145)

## 2024-07-15 LAB — GLUCOSE, CAPILLARY
Glucose-Capillary: 200 mg/dL — ABNORMAL HIGH (ref 70–99)
Glucose-Capillary: 224 mg/dL — ABNORMAL HIGH (ref 70–99)
Glucose-Capillary: 235 mg/dL — ABNORMAL HIGH (ref 70–99)

## 2024-07-15 LAB — MAGNESIUM: Magnesium: 2.1 mg/dL (ref 1.7–2.4)

## 2024-07-15 LAB — COOXEMETRY PANEL
Carboxyhemoglobin: 1.8 % — ABNORMAL HIGH (ref 0.5–1.5)
Methemoglobin: 1.7 % — ABNORMAL HIGH (ref 0.0–1.5)
O2 Saturation: 61.6 %
Total hemoglobin: 10.6 g/dL — ABNORMAL LOW (ref 12.0–16.0)

## 2024-07-15 LAB — IRON AND TIBC
Iron: 21 ug/dL — ABNORMAL LOW (ref 28–170)
Saturation Ratios: 7 % — ABNORMAL LOW (ref 10.4–31.8)
TIBC: 297 ug/dL (ref 250–450)
UIBC: 276 ug/dL

## 2024-07-15 LAB — FERRITIN: Ferritin: 97 ng/mL (ref 11–307)

## 2024-07-15 MED ORDER — INSULIN GLARGINE-YFGN 100 UNIT/ML ~~LOC~~ SOLN
15.0000 [IU] | Freq: Every day | SUBCUTANEOUS | Status: DC
  Administered 2024-07-15 – 2024-07-16 (×2): 15 [IU] via SUBCUTANEOUS
  Filled 2024-07-15 (×3): qty 0.15

## 2024-07-15 MED ORDER — PRISMASOL BGK 4/2.5 32-4-2.5 MEQ/L EC SOLN: Status: DC

## 2024-07-15 MED ORDER — POTASSIUM PHOSPHATES 15 MMOLE/5ML IV SOLN
30.0000 mmol | Freq: Once | INTRAVENOUS | Status: AC
  Administered 2024-07-15: 30 mmol via INTRAVENOUS
  Filled 2024-07-15: qty 10

## 2024-07-15 NOTE — Plan of Care (Signed)
  Problem: Nutritional: Goal: Progress toward achieving an optimal weight will improve Outcome: Progressing   Problem: Skin Integrity: Goal: Risk for impaired skin integrity will decrease Outcome: Progressing   Problem: Tissue Perfusion: Goal: Adequacy of tissue perfusion will improve Outcome: Progressing

## 2024-07-15 NOTE — Progress Notes (Signed)
 Advanced Heart Failure Rounding Note  HF Cardiologist: Dr. Cherrie Chief Complaint: CHF Patient Profile     73 y/o F seen earlier this admission w/ cardiogenic shock in setting of Afib, course c/b renal failure now HD dependent.  Had stabilized from HF standpoint with plans for SNF for rehab.  We were asked to see again d/t recurrent Afib with RVR>>hypotension/shock. Started on NE and restarted on CRRT.  Subjective:    Underwent repeat DC-CV 10/24  Remains in NSR. Still on NE 14 and VP 0.03  On CVVHD pulling -50-100 Weight down 11 pounds in 3 days  More alert but feels miserable. Says she doesn't want to do this anymore. Wants to go home  Objective:    Weight Range: 81.8 kg Body mass index is 30.01 kg/m.   Vital Signs:   Temp:  [97.6 F (36.4 C)-97.9 F (36.6 C)] 97.9 F (36.6 C) (10/26 0800) Pulse Rate:  [73-97] 85 (10/26 1115) Resp:  [11-24] 21 (10/26 1200) BP: (77-121)/(51-102) 90/56 (10/26 1200) SpO2:  [86 %-100 %] 93 % (10/26 1115) FiO2 (%):  [40 %] 40 % (10/25 1524) Weight:  [81.8 kg] 81.8 kg (10/26 0615) Last BM Date : 07/15/24  Weight change: Filed Weights   07/13/24 0457 07/14/24 0500 07/15/24 0615  Weight: 85 kg 84.5 kg 81.8 kg   Intake/Output:  Intake/Output Summary (Last 24 hours) at 07/15/2024 1245 Last data filed at 07/15/2024 1200 Gross per 24 hour  Intake 1654.16 ml  Output 3380.2 ml  Net -1726.04 ml   Physical Exam   General:  elderly eak No resp difficulty HEENT: normal Neck: supple. + HD cath Cor: Regular rate & rhythm. No rubs, gallops or murmurs. Lungs: clear Abdomen: soft, nontender, nondistended.Good bowel sounds. Extremities: no cyanosis, clubbing, rash, 1+ edema Neuro: alert & orientedx3, cranial nerves grossly intact. moves all 4 extremities w/o difficulty. Affect pleasant   Labs    CBC Recent Labs    07/13/24 0500 07/14/24 0500 07/14/24 1021 07/14/24 1521  WBC 7.8 8.1  --   --   HGB 10.5* 9.6* 12.9 12.9   HCT 30.9* 28.3* 38.0 38.0  MCV 80.3 81.6  --   --   PLT 151 132*  --   --    Basic Metabolic Panel Recent Labs    89/74/74 0500 07/14/24 1021 07/14/24 1533 07/15/24 0524  NA 127*   < > 128* 130*  K 3.3*   < > 6.6* 3.3*  CL 93*  --  93* 96*  CO2 24  --  24 24  GLUCOSE 330*  --  330* 216*  BUN 7*  --  7* 9  CREATININE 1.10*  --  1.09* 1.10*  CALCIUM  8.7*  --  8.4* 9.4  MG 2.0  --   --  2.1  PHOS 2.0*  --  8.5* 1.7*   < > = values in this interval not displayed.   Liver Function Tests Recent Labs    07/14/24 1533 07/15/24 0524  ALBUMIN 2.6* 2.6*    BNP (last 3 results) Recent Labs    06/17/24 1813  BNP 1,998.5*   Medications:    Scheduled Medications:  apixaban   5 mg Oral BID   atorvastatin   80 mg Oral Daily   Chlorhexidine  Gluconate Cloth  6 each Topical Q0600   feeding supplement  237 mL Oral TID BM   insulin  aspart  0-5 Units Subcutaneous QHS   insulin  aspart  0-6 Units Subcutaneous TID WC   insulin   aspart  3 Units Subcutaneous TID WC   insulin  glargine-yfgn  15 Units Subcutaneous Daily   levothyroxine   50 mcg Oral Q0600   midodrine  20 mg Oral Q8H   multivitamin  1 tablet Oral QHS   polyethylene glycol  17 g Oral BID   sodium chloride  flush  10-40 mL Intracatheter Q12H   sodium chloride  flush  3 mL Intravenous Q12H   sodium chloride  flush  3 mL Intravenous Q12H    Infusions:  amiodarone 30 mg/hr (07/15/24 1200)   norepinephrine  (LEVOPHED ) Adult infusion 14 mcg/min (07/15/24 1200)   potassium PHOSPHATE IVPB (in mmol) 85 mL/hr at 07/15/24 1200   PrismaSol BGK 2/3.5 400 mL/hr at 07/15/24 0525   PrismaSol BGK 2/3.5 400 mL/hr at 07/15/24 0525   prismasol BGK 4/2.5 1,000 mL/hr at 07/15/24 9072   vasopressin 0.03 Units/min (07/15/24 1200)     PRN Medications: acetaminophen  **OR** acetaminophen , albuterol , bisacodyl, calcium  carbonate, Gerhardt's butt cream, guaiFENesin -dextromethorphan , heparin , loperamide, naLOXone (NARCAN)  injection, ondansetron   **OR** ondansetron  (ZOFRAN ) IV, mouth rinse, senna-docusate, sodium chloride  flush, sodium chloride  flush, traMADol   Patient Profile   [name] is a 73 y.o.  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   Admit with acute on chronic biventricular HF in setting of AF c/b acute renal failure.  Assessment/Plan   Acute on Chronic Biventricular Heart Failure  New onset RV Failure -> cardiogenic shock - Presented in cardiogenic shock, worsening renal failure, afib - echo 9/30: EF 20-25%, mod LVH, D-shaped septum, RV mod reduced, degenerative MV w/ severe MR - RHC 9/30: RA 17, PA 63/35 (46), PCW 40, Fick 5.7/2.8, TD 3.7/1.8, PVR 1.6, PAPi 1.6  - s/p repeat DC-CV 10/24 - now on NE 14 and VP 0.03 - we have been unable to wean  - remains on  CRRT for volume management - continue midodrine 20 mg TID - She is end-stage - Now full DNR - Still requiring high-dose pressors. She is uncomfortable. I think it is time to move forward with full comfort care. Will d/w family.   Persistent Atrial Fibrillation - new onset 8/25 - Continue Eliquis  5 mg BID - 10/5 S/P TEE/DC-CV -->SR. Back in AF with RVR, rates now 90s-low 100s - Repeat DC-CV on 10/24 -> Remains in NSR - Continue IV amio 30/h for now  Severe MR - not noted on echo 5/23 - severe on echo 9/30 with degenerative valve - Not a clip candidate   Acute renal failure - cardio-renal  - CRRT per nephrology,  appreciate assistance  - remains anuric   CAD - LHC in 2015 showed 99% RCA occlusion with collaterals - hs-trop 35>35, No ACS - continue atorva 80 mg daily - No s/s angina  CRITICAL CARE Performed by: Cherrie Sieving  Total critical care time: 40 minutes  Critical care time was exclusive of separately billable procedures and treating other patients.  Critical care was necessary to treat or prevent imminent or  life-threatening deterioration.  Critical care was time spent personally by me (independent of midlevel providers or residents) on the following activities: development of treatment plan with patient and/or surrogate as well as nursing, discussions with consultants, evaluation of patient's response to treatment, examination of patient, obtaining history from patient or surrogate, ordering and performing treatments and interventions, ordering and review of laboratory studies, ordering and review of radiographic studies, pulse oximetry and re-evaluation of patient's condition.  Length of Stay: 68  Toribio Fuel, MD  07/15/2024, 12:45 PM  Advanced Heart Failure Team Pager [phone] (M-F; 7a - 5p)  Please contact [facility] for night-coverage after hours (5p -7a ) and weekends on [email or web address]

## 2024-07-15 NOTE — Progress Notes (Signed)
 PROGRESS NOTE    [name]  FMW:[id number] DOB: May 06, 1951 DOA: 06/17/2024 PCP: Tobie Suzzane POUR, MD  73 y.o. with history of combined systolic and diastolic heart failure s/p BiV ICD, CAD, OSA on bipap, LBBB, HTN, hypotension, and atrial fibrillation.  - Admitted with volume overload, AKI - Echo 9/30 noted EF 20-25%, severe MR, moderately reduced RV - RHC 9/30> elevated PA pressures and filling pressures, PA PI of 1.6 - Treated with inotropes and vasopressors in ICU, now off - Complicated by worsening renal failure, cardiorenal syndrome, failed high-dose diuretic challenge, then started on CRRT from 9/30-10/10 -sp cardioversion for A-fib -Palliative care consulted and following - 10/11 initiated hemodialysis - Transferred out of ICU to [facility] service 10/14 -10/15: Tunneled dialysis catheter placed in IR - Multiple palliative care conversations, full code and full scope - CLIP for HD completed : Accepted to [facility] - 10/21 PM noted to be back in A-fib -10/22: A-fib RVR with hypotension> started on Amio gtt., transferred to ICU, started CRRT for volume overload, started on Norepi - 10/23, CRRT continued, remains on NE -10/24: Vasopressin started  Subjective: - Weak and tired, bit sleepy  Assessment and Plan:  Acute on chronic biventricular failure Cardiogenic shock -Admitted with cardiogenic shock, worsening renal failure and A-fib - Echo with EF 2025, moderately reduced RV, severe mitral regurgitation -RHC 9/30 noted elevated PA pressures wedge of 40, PA PI 1.6 - Treated with vasopressors, inotropes and high-dose diuretics, TEE/DCCV - With worsening renal failure started CRRT then intermittent hemodialysis since 10/11, continued on high-dose midodrine -Now back on CRRT since 10/22, CVP improving,  -On a NE, 11 mcg and vasopressin> attempt to wean as tolerated - Palliative care following, prognosis is poor> was limited code, 10/25 agreed to full DNR, called and  updated son yesterday recommended comfort care if she doesn't stabilize by Monday  Severe mitral regurgitation -Noted on echo this admission, not felt to be a mitral clip candidate  Paroxysmal atrial fibrillation - New onset in August - Cardioverted 10/5 - was stable in sinus rhythm on amiodarone and Eliquis  - Back in A-fib RVR 10/22, started on Amio gtt., cardioverted again 10/24  AKI on CKD4 - Cardiorenal, failed high-dose diuretics - Treated with CRRT 9/30-10/10 - Started on intermittent hemodialysis 10/11 - Nephrology following, sp HD Cath and CLIP, -now CRRT as above, volume status improving  Hyponatremia - Stable  CAD -Stable at this time, no ACS on admission, continue atorvastatin   Acute hypoxic respiratory failure - Secondary to above, improving  Type 2 diabetes mellitus - Will continue Semglee , increase meal coverage  Microcytic anemia - Stable, monitor   DVT prophylaxis: Apixaban  Code Status: DNR Family Communication: No family at bedside, called and updated son yesterday Disposition Plan: Pending clinical course    Objective: Vitals:   07/15/24 0935 07/15/24 0939 07/15/24 0945 07/15/24 1000  BP: (!) 84/57 (!) 86/56 (!) 90/57 (!) 88/58  Pulse: 83 83 83 84  Resp: 11 11 11 11   Temp:      TempSrc:      SpO2: 94% 95% 95% 95%  Weight:      Height:        Intake/Output Summary (Last 24 hours) at 07/15/2024 1014 Last data filed at 07/15/2024 1000 Gross per 24 hour  Intake 1569.35 ml  Output 3268.2 ml  Net -1698.85 ml   Filed Weights   07/13/24 0457 07/14/24 0500 07/15/24 0615  Weight: 85 kg 84.5 kg 81.8 kg    Examination:  Chronically ill, sleepy but easily arousable, oriented to self and place HEENT: Right IJ HD catheter, + JVD CVS: S1-S2, sinus rhythm Lungs: Decreased breath sounds at the bases Abdomen: Soft, nontender bowel sounds present Remedies: Trace edema, compression stockings on   Data Reviewed:   CBC: Recent Labs  Lab  07/09/24 0523 07/12/24 0405 07/13/24 0500 07/14/24 0500 07/14/24 1021 07/14/24 1521  WBC 7.1 6.6 7.8 8.1  --   --   HGB 10.2* 9.6* 10.5* 9.6* 12.9 12.9  HCT 29.4* 28.2* 30.9* 28.3* 38.0 38.0  MCV 77.6* 79.0* 80.3 81.6  --   --   PLT 168 155 151 132*  --   --    Basic Metabolic Panel: Recent Labs  Lab 07/12/24 0405 07/12/24 1608 07/13/24 0500 07/13/24 1547 07/14/24 0500 07/14/24 1021 07/14/24 1521 07/14/24 1533 07/15/24 0524  NA 122*   < > 125* 130* 127* 133* 133* 128* 130*  K 3.4*   < > 6.2* 3.6 3.3* 3.5 4.0 6.6* 3.3*  CL 89*   < > 90* 96* 93*  --   --  93* 96*  CO2 23   < > 22 24 24   --   --  24 24  GLUCOSE 333*   < > 366* 267* 330*  --   --  330* 216*  BUN 19   < > 9 8 7*  --   --  7* 9  CREATININE 2.34*   < > 1.38* 1.25* 1.10*  --   --  1.09* 1.10*  CALCIUM  7.9*   < > 7.7* 8.2* 8.7*  --   --  8.4* 9.4  MG 1.9  --  2.4  --  2.0  --   --   --  2.1  PHOS 2.3*   < > 7.0* 2.2* 2.0*  --   --  8.5* 1.7*   < > = values in this interval not displayed.   GFR: Estimated Creatinine Clearance: 46.7 mL/min (A) (by C-G formula based on SCr of 1.1 mg/dL (H)). Liver Function Tests: Recent Labs  Lab 07/11/24 1752 07/12/24 0405 07/13/24 0500 07/13/24 1547 07/14/24 0500 07/14/24 1533 07/15/24 0524  AST 16  --   --   --   --   --   --   ALT <5  --   --   --   --   --   --   ALKPHOS 50  --   --   --   --   --   --   BILITOT 1.1  --   --   --   --   --   --   PROT 6.3*  --   --   --   --   --   --   ALBUMIN 2.4*   < > 2.6* 2.5* 2.5* 2.6* 2.6*   < > = values in this interval not displayed.   No results for input(s): LIPASE, AMYLASE in the last 168 hours. No results for input(s): AMMONIA in the last 168 hours. Coagulation Profile: No results for input(s): INR, PROTIME in the last 168 hours. Cardiac Enzymes: No results for input(s): CKTOTAL, CKMB, CKMBINDEX, TROPONINI in the last 168 hours. BNP (last 3 results) No results for input(s): PROBNP in the last  8760 hours. HbA1C: No results for input(s): HGBA1C in the last 72 hours.  CBG: Recent Labs  Lab 07/14/24 0619 07/14/24 1111 07/14/24 1521 07/14/24 2113 07/15/24 0631  GLUCAP 173* 171* 203* 212* 200*  Lipid Profile: No results for input(s): CHOL, HDL, LDLCALC, TRIG, CHOLHDL, LDLDIRECT in the last 72 hours. Thyroid  Function Tests: No results for input(s): TSH, T4TOTAL, FREET4, T3FREE, THYROIDAB in the last 72 hours. Anemia Panel: Recent Labs    07/15/24 0524  FERRITIN 97  TIBC 297  IRON 21*   Urine analysis:    Component Value Date/Time   COLORURINE YELLOW 06/18/2024 1730   APPEARANCEUR CLEAR 06/18/2024 1730   LABSPEC 1.026 06/18/2024 1730   PHURINE 5.0 06/18/2024 1730   GLUCOSEU NEGATIVE 06/18/2024 1730   HGBUR NEGATIVE 06/18/2024 1730   BILIRUBINUR NEGATIVE 06/18/2024 1730   KETONESUR NEGATIVE 06/18/2024 1730   PROTEINUR NEGATIVE 06/18/2024 1730   UROBILINOGEN 0.2 04/13/2014 1612   NITRITE NEGATIVE 06/18/2024 1730   LEUKOCYTESUR NEGATIVE 06/18/2024 1730   Sepsis Labs: @LABRCNTIP (procalcitonin:4,lacticidven:4)  )No results found for this or any previous visit (from the past 240 hours).   Radiology Studies: No results found.   Scheduled Meds:  apixaban   5 mg Oral BID   atorvastatin   80 mg Oral Daily   Chlorhexidine  Gluconate Cloth  6 each Topical Q0600   feeding supplement  237 mL Oral TID BM   insulin  aspart  0-5 Units Subcutaneous QHS   insulin  aspart  0-6 Units Subcutaneous TID WC   insulin  aspart  3 Units Subcutaneous TID WC   insulin  glargine-yfgn  15 Units Subcutaneous Daily   levothyroxine   50 mcg Oral Q0600   midodrine  20 mg Oral Q8H   multivitamin  1 tablet Oral QHS   polyethylene glycol  17 g Oral BID   sodium chloride  flush  10-40 mL Intracatheter Q12H   sodium chloride  flush  3 mL Intravenous Q12H   sodium chloride  flush  3 mL Intravenous Q12H   Continuous Infusions:  amiodarone 30 mg/hr (07/15/24 1000)    norepinephrine  (LEVOPHED ) Adult infusion 13 mcg/min (07/15/24 1000)   potassium PHOSPHATE IVPB (in mmol) 85 mL/hr at 07/15/24 1000   PrismaSol BGK 2/3.5 400 mL/hr at 07/15/24 0525   PrismaSol BGK 2/3.5 400 mL/hr at 07/15/24 0525   prismasol BGK 4/2.5 1,000 mL/hr at 07/15/24 9072   vasopressin 0.03 Units/min (07/15/24 1000)      LOS: 28 days    Time spent:    Sigurd Pac, MD [facility]   07/15/2024, 10:14 AM

## 2024-07-15 NOTE — Progress Notes (Signed)
 [facility] Progress Note    Assessment/ Plan:   Dialysis Dependent AKI on CKD3b: Likely secondary to new RV heart failure, persistent atrial fibrillation, cardiorenal Syndrome  -initially--failed high dose diuretic challenge (anuric) and CRRT initiated mainly for volume. Continued CRRT: 9/30-10/10. S/p TDC placement with IR on 10/15 -HD was on TTS schedule typically w/ midodrine support with avoidance of albumin. CLIP: was accepted to [facility] @ 9:30am, start 10/23 but on hold given that she is back in ICU -no evidence of renal recovery at this junction  -CRRT restarted 10/22 given hypotension/volume overload, goal CVP 6-7. C/w CRRT today. UFG: net neg 50cc/hr. CRRT bag changes as below  Hyponatremia: hypervolemic.  Will attempt to manage with HD/UF, Na up to 130   AoC BiV HF; New onset RV failure, cardiogenic shock -pressor support per primary service, c/w midodrine   Atrial fibrillation with RVR: S/p cardioversion 10/5 & 10/24.  On eliquis  on IV amio. Per cardiology   CAD: On atorvastatin   Hypokalemia- on all 2k bags, will adjust dialysate to 4k for K 3.3, further adjustments based on serial labs  Anemia- hgb 12.9, transfuse prn for hgb < 7   Deconditioning - per primary service  Palliative care following, she is DNR. Possible transition to comfort care soon.  Discussed with ICU RN and primary service @ bedside.  Ephriam Stank, MD [facility]   Subjective:   Patient seen and examined on CRRT.  HyperK possible related to drawing blood from line in which kphos was running. Patient denies any worsening SOB, no complaints otherwise. Net neg ~1.7L Pressors: levo, vaso   Objective:   BP 98/61   Pulse 84   Temp 97.8 F (36.6 C) (Axillary)   Resp 15   Ht 5' 5 (1.651 m)   Wt 81.8 kg   SpO2 97%   BMI 30.01 kg/m   Intake/Output Summary (Last 24 hours) at 07/15/2024 0803 Last data filed at 07/15/2024 0800 Gross per 24 hour   Intake 1411.41 ml  Output 3218.2 ml  Net -1806.79 ml   Weight change: -2.7 kg  Physical Exam: Gen: ill appearing, NAD CVS: irreg irreg Resp: decreased breath sounds bibasilar, normal wob Abd: soft Ext: trace pitting dependent edema Neuro: drowsy but arousable/interactive Dialysis access: RIJ TDC in use  Imaging: No results found.  Labs: BMET Recent Labs  Lab 07/12/24 0405 07/12/24 1608 07/12/24 1709 07/13/24 0500 07/13/24 1547 07/14/24 0500 07/14/24 1021 07/14/24 1521 07/14/24 1533 07/15/24 0524  NA 122* 123* 122* 125* 130* 127* 133* 133* 128* 130*  K 3.4* 6.0* 5.9* 6.2* 3.6 3.3* 3.5 4.0 6.6* 3.3*  CL 89* 89* 88* 90* 96* 93*  --   --  93* 96*  CO2 23 23 23 22 24 24   --   --  24 24  GLUCOSE 333* 432* 423* 366* 267* 330*  --   --  330* 216*  BUN 19 12 11 9 8  7*  --   --  7* 9  CREATININE 2.34* 1.92* 1.81* 1.38* 1.25* 1.10*  --   --  1.09* 1.10*  CALCIUM  7.9* 7.9* 7.5* 7.7* 8.2* 8.7*  --   --  8.4* 9.4  PHOS 2.3* 7.2*  --  7.0* 2.2* 2.0*  --   --  8.5* 1.7*   CBC Recent Labs  Lab 07/09/24 0523 07/12/24 0405 07/13/24 0500 07/14/24 0500 07/14/24 1021 07/14/24 1521  WBC 7.1 6.6 7.8 8.1  --   --   HGB 10.2* 9.6*  10.5* 9.6* 12.9 12.9  HCT 29.4* 28.2* 30.9* 28.3* 38.0 38.0  MCV 77.6* 79.0* 80.3 81.6  --   --   PLT 168 155 151 132*  --   --     Medications:     apixaban   5 mg Oral BID   atorvastatin   80 mg Oral Daily   Chlorhexidine  Gluconate Cloth  6 each Topical Q0600   feeding supplement  237 mL Oral TID BM   insulin  aspart  0-5 Units Subcutaneous QHS   insulin  aspart  0-6 Units Subcutaneous TID WC   insulin  aspart  3 Units Subcutaneous TID WC   insulin  glargine-yfgn  15 Units Subcutaneous Daily   levothyroxine   50 mcg Oral Q0600   midodrine  20 mg Oral Q8H   multivitamin  1 tablet Oral QHS   polyethylene glycol  17 g Oral BID   sodium chloride  flush  10-40 mL Intracatheter Q12H   sodium chloride  flush  3 mL Intravenous Q12H   sodium chloride  flush   3 mL Intravenous Q12H      Ephriam Stank, MD [facility] 07/15/2024, 8:03 AM

## 2024-07-15 NOTE — Plan of Care (Signed)
  Problem: Fluid Volume: Goal: Ability to maintain a balanced intake and output will improve Outcome: Progressing   Problem: Metabolic: Goal: Ability to maintain appropriate glucose levels will improve Outcome: Progressing   Problem: Nutritional: Goal: Maintenance of adequate nutrition will improve Outcome: Progressing   Problem: Activity: Goal: Capacity to carry out activities will improve Outcome: Progressing   Problem: Cardiac: Goal: Ability to achieve and maintain adequate cardiopulmonary perfusion will improve Outcome: Progressing   Problem: Education: Goal: Knowledge of General Education information will improve Description: Including pain rating scale, medication(s)/side effects and non-pharmacologic comfort measures Outcome: Progressing   Problem: Clinical Measurements: Goal: Ability to maintain clinical measurements within normal limits will improve Outcome: Progressing Goal: Will remain free from infection Outcome: Progressing Goal: Diagnostic test results will improve Outcome: Progressing Goal: Respiratory complications will improve Outcome: Progressing Goal: Cardiovascular complication will be avoided Outcome: Progressing   Problem: Activity: Goal: Risk for activity intolerance will decrease Outcome: Progressing   Problem: Nutrition: Goal: Adequate nutrition will be maintained Outcome: Progressing   Problem: Coping: Goal: Level of anxiety will decrease Outcome: Progressing   Problem: Elimination: Goal: Will not experience complications related to bowel motility Outcome: Progressing   Problem: Pain Managment: Goal: General experience of comfort will improve and/or be controlled Outcome: Progressing   Problem: Safety: Goal: Ability to remain free from injury will improve Outcome: Progressing   Problem: Skin Integrity: Goal: Risk for impaired skin integrity will decrease Outcome: Progressing   Problem: Activity: Goal: Ability to return to  baseline activity level will improve Outcome: Progressing

## 2024-07-16 ENCOUNTER — Encounter

## 2024-07-16 DIAGNOSIS — I5023 Acute on chronic systolic (congestive) heart failure: Secondary | ICD-10-CM | POA: Diagnosis not present

## 2024-07-16 DIAGNOSIS — I5082 Biventricular heart failure: Secondary | ICD-10-CM | POA: Diagnosis not present

## 2024-07-16 DIAGNOSIS — Z515 Encounter for palliative care: Secondary | ICD-10-CM | POA: Diagnosis not present

## 2024-07-16 DIAGNOSIS — R570 Cardiogenic shock: Secondary | ICD-10-CM | POA: Diagnosis not present

## 2024-07-16 DIAGNOSIS — Z7189 Other specified counseling: Secondary | ICD-10-CM | POA: Diagnosis not present

## 2024-07-16 DIAGNOSIS — I4819 Other persistent atrial fibrillation: Secondary | ICD-10-CM | POA: Diagnosis not present

## 2024-07-16 LAB — RENAL FUNCTION PANEL
Albumin: 2.5 g/dL — ABNORMAL LOW (ref 3.5–5.0)
Albumin: 2.4 g/dL — ABNORMAL LOW (ref 3.5–5.0)
Anion gap: 11 (ref 5–15)
Anion gap: 11 (ref 5–15)
BUN: 8 mg/dL (ref 8–23)
BUN: 8 mg/dL (ref 8–23)
CO2: 23 mmol/L (ref 22–32)
CO2: 22 mmol/L (ref 22–32)
Calcium: 9 mg/dL (ref 8.9–10.3)
Calcium: 7.7 mg/dL — ABNORMAL LOW (ref 8.9–10.3)
Chloride: 96 mmol/L — ABNORMAL LOW (ref 98–111)
Chloride: 90 mmol/L — ABNORMAL LOW (ref 98–111)
Creatinine, Ser: 1.02 mg/dL — ABNORMAL HIGH (ref 0.44–1.00)
Creatinine, Ser: 0.97 mg/dL (ref 0.44–1.00)
GFR, Estimated: 57 mL/min — ABNORMAL LOW (ref >60)
GFR, Estimated: >60 mL/min (ref >60)
Glucose, Bld: 228 mg/dL — ABNORMAL HIGH (ref 70–99)
Glucose, Bld: 523 mg/dL (ref 70–99)
Phosphorus: 1.5 mg/dL — ABNORMAL LOW (ref 2.5–4.6)
Phosphorus: >30 mg/dL — ABNORMAL HIGH (ref 2.5–4.6)
Potassium: 3.4 mmol/L — ABNORMAL LOW (ref 3.5–5.1)
Potassium: >7.5 mmol/L (ref 3.5–5.1)
Sodium: 130 mmol/L — ABNORMAL LOW (ref 135–145)
Sodium: 123 mmol/L — ABNORMAL LOW (ref 135–145)

## 2024-07-16 LAB — COOXEMETRY PANEL
Carboxyhemoglobin: 0.9 % (ref 0.5–1.5)
Carboxyhemoglobin: 1.7 % — ABNORMAL HIGH (ref 0.5–1.5)
Methemoglobin: <0.7 % (ref 0.0–1.5)
Methemoglobin: 0.7 % (ref 0.0–1.5)
O2 Saturation: 36.7 %
O2 Saturation: 44.3 %
Total hemoglobin: 10.4 g/dL — ABNORMAL LOW (ref 12.0–16.0)
Total hemoglobin: 10.7 g/dL — ABNORMAL LOW (ref 12.0–16.0)

## 2024-07-16 LAB — CBC
HCT: 30 % — ABNORMAL LOW (ref 36.0–46.0)
Hemoglobin: 10.2 g/dL — ABNORMAL LOW (ref 12.0–15.0)
MCH: 27.2 pg (ref 26.0–34.0)
MCHC: 34 g/dL (ref 30.0–36.0)
MCV: 80 fL (ref 80.0–100.0)
Platelets: 111 K/uL — ABNORMAL LOW (ref 150–400)
RBC: 3.75 MIL/uL — ABNORMAL LOW (ref 3.87–5.11)
RDW: 21.8 % — ABNORMAL HIGH (ref 11.5–15.5)
WBC: 8 K/uL (ref 4.0–10.5)
nRBC: 2.6 % — ABNORMAL HIGH (ref 0.0–0.2)

## 2024-07-16 LAB — GLUCOSE, CAPILLARY
Glucose-Capillary: 180 mg/dL — ABNORMAL HIGH (ref 70–99)
Glucose-Capillary: 178 mg/dL — ABNORMAL HIGH (ref 70–99)

## 2024-07-16 LAB — MAGNESIUM: Magnesium: 2.1 mg/dL (ref 1.7–2.4)

## 2024-07-16 MED ORDER — POLYVINYL ALCOHOL 1.4 % OP SOLN: 1.0000 [drp] | Freq: Four times a day (QID) | OPHTHALMIC | Status: DC | PRN

## 2024-07-16 MED ORDER — GLYCOPYRROLATE 0.2 MG/ML IJ SOLN: 0.2000 mg | INTRAMUSCULAR | Status: DC | PRN

## 2024-07-16 MED ORDER — SODIUM CHLORIDE 0.9 % IV SOLN
0.0000 ug/min | INTRAVENOUS | Status: DC
  Filled 2024-07-16: qty 10

## 2024-07-16 MED ORDER — MIDAZOLAM HCL (PF) 2 MG/2ML IJ SOLN: 2.0000 mg | INTRAMUSCULAR | Status: DC | PRN

## 2024-07-16 MED ORDER — ONDANSETRON HCL 4 MG/2ML IJ SOLN: 4.0000 mg | Freq: Four times a day (QID) | INTRAMUSCULAR | Status: DC | PRN

## 2024-07-16 MED ORDER — MORPHINE 100MG IN NS 100ML (1MG/ML) PREMIX INFUSION: 0.0000 mg/h | INTRAVENOUS | Status: DC

## 2024-07-16 MED ORDER — PRISMASOL BGK 4/2.5 32-4-2.5 MEQ/L EC SOLN: Status: DC

## 2024-07-16 MED ORDER — HYDROMORPHONE HCL 1 MG/ML IJ SOLN
0.5000 mg | INTRAMUSCULAR | Status: DC | PRN
  Administered 2024-07-16 (×4): 0.5 mg via INTRAVENOUS
  Filled 2024-07-16 (×4): qty 0.5

## 2024-07-16 MED ORDER — GLYCOPYRROLATE 1 MG PO TABS: 1.0000 mg | ORAL_TABLET | ORAL | Status: DC | PRN

## 2024-07-16 MED ORDER — ONDANSETRON 4 MG PO TBDP: 4.0000 mg | ORAL_TABLET | Freq: Four times a day (QID) | ORAL | Status: DC | PRN

## 2024-07-16 MED ORDER — MORPHINE BOLUS VIA INFUSION: 5.0000 mg | INTRAVENOUS | Status: DC | PRN

## 2024-07-16 MED ORDER — POTASSIUM PHOSPHATES 15 MMOLE/5ML IV SOLN
45.0000 mmol | Freq: Once | INTRAVENOUS | Status: AC
  Administered 2024-07-16: 45 mmol via INTRAVENOUS
  Filled 2024-07-16: qty 15

## 2024-07-17 LAB — GLUCOSE, CAPILLARY
Glucose-Capillary: 163 mg/dL — ABNORMAL HIGH (ref 70–99)
Glucose-Capillary: 179 mg/dL — ABNORMAL HIGH (ref 70–99)

## 2024-07-18 ENCOUNTER — Telehealth: Payer: Self-pay

## 2024-07-18 NOTE — Telephone Encounter (Signed)
 Copied from CRM #[id number]. Topic: General - Deceased Patient >> 08-01-2024  3:21 PM Everette C wrote: Name of caller: Elna Lesches   Date of death: 07-30-24    Name of funeral home:  [facility]  Phone number of funeral home: [phone]  Provider that needs to sign form: Death Certificate NCDAVES [id number]  Timeline for signing: the family has requested cremation so urgency has been stressed

## 2024-07-19 LAB — ECHO TEE: Est EF: <20

## 2024-07-21 NOTE — Progress Notes (Signed)
 Noted that pt has dcsd. Contacted out-pt HD clinic, [facility], to inform of passing. No further support needed at this time.   Braian Tijerina Dialysis navigator [phone]

## 2024-07-21 NOTE — Progress Notes (Signed)
 This chaplain is present for F/U spiritual care in the setting of supporting the Pt. and family alongside their faith. The Pt. son-Brooks and daughter in law-Alicia are visiting at the bedside. The Pt. is taking sips from a straw with the assistance of the RN.  The chaplain reconnects with family and listens reflectively as [name] describes today as rough, in the context of the goal of making medical decisions for the Pt. The chaplain introduced [name] to the PMT NP-Dawn and offered continued support through the decisions.  Brooks added his thoughts about comfort care will not take the place of God's will.  The family accepted the word legacy to describe the Pt. life journey. The family updated the chaplain on the Pt. grand children and great grand children. The chaplain with permission from the family, shared scripture with the Pt. before stepping out of the room.  Chaplain Leeroy Hummer [phone]

## 2024-07-21 NOTE — Progress Notes (Signed)
 No ICM remote transmission received for 08/02/24 due to currently hospitalized and next ICM transmission scheduled for 07/30/2024.

## 2024-07-21 NOTE — Progress Notes (Signed)
 PROGRESS NOTE    [name]  FMW:[id number] DOB: May 23, 1951 DOA: 06/17/2024 PCP: Tobie Suzzane POUR, MD  73 y.o. with history of combined systolic and diastolic heart failure s/p BiV ICD, CAD, OSA on bipap, LBBB, HTN, hypotension, and atrial fibrillation.  - Admitted with volume overload, AKI - Echo 9/30 noted EF 20-25%, severe MR, moderately reduced RV - RHC 9/30> elevated PA pressures and filling pressures, PA PI of 1.6 - Treated with inotropes and vasopressors in ICU, now off - Complicated by worsening renal failure, cardiorenal syndrome, failed high-dose diuretic challenge, then started on CRRT from 9/30-10/10 -sp cardioversion for A-fib -Palliative care consulted and following - 10/11 initiated hemodialysis - [facility] -10[facility]15: Tunneled dialysis catheter placed in IR - Multiple palliative care conversations, full code and full scope - CLIP for HD completed : Accepted to [facility] - 10/21 PM noted to be back in A-fib -10/22: A-fib RVR with hypotension> started on Amio gtt., transferred to ICU, started CRRT for volume overload, started on Norepi - 10/23, CRRT continued, remains on NE -10/24: Vasopressin started - 10/25-26, unable to be weaned off nor epi or vasopressin  Subjective: - Tired, no events overnight, remains on nor epi 17 mcg and vasopressin  Assessment and Plan:  Acute on chronic biventricular failure Cardiogenic shock -Admitted with cardiogenic shock, worsening renal failure and A-fib - Echo with EF 2025, moderately reduced RV, severe mitral regurgitation -RHC 9/30 noted elevated PA pressures wedge of 40, PA PI 1.6 - Treated with vasopressors, inotropes and high-dose diuretics, TEE/DCCV - With worsening renal failure started CRRT then intermittent hemodialysis since 10/11, continued on high-dose midodrine -Now back on CRRT since 10/22, CVP improving,  -On a NE, 11 mcg and vasopressin> attempt to wean as tolerated -  Palliative care following, prognosis is poor>10/25 agreed to full DNR, discussed comfort care with son on Saturday - Dr. Cherrie discussed with family this morning regarding comfort care, family would like to wait till tomorrow  Severe mitral regurgitation -Noted on echo this admission, not felt to be a mitral clip candidate  Paroxysmal atrial fibrillation - New onset in August - Cardioverted 10/5 - was stable in sinus rhythm on amiodarone and Eliquis  - Back in A-fib RVR 10/22, started on Amio gtt., cardioverted again 10/24  AKI on CKD4 - Cardiorenal, failed high-dose diuretics - Treated with CRRT 9/30-10/10 - Started on intermittent hemodialysis 10/11 - Nephrology following, sp HD Cath and CLIP, -now CRRT as above, volume status improving  Hyponatremia - Stable  CAD -Stable at this time, no ACS on admission, continue atorvastatin   Acute hypoxic respiratory failure - Secondary to above, improving  Type 2 diabetes mellitus - Remains on Semglee  and meal coverage  Microcytic anemia - Stable, monitor   DVT prophylaxis: Apixaban  Code Status: DNR Family Communication: No family at bedside, called and updated son 10/25 Disposition Plan: Comfort care, anticipate hospital death    Objective: Vitals:   2024/08/01 1015 August 01, 2024 1030 Aug 01, 2024 1045 08/01/2024 1100  BP: 102/64 104/65 104/66 108/72  Pulse: 83 82 84 86  Resp: 12 13 15 13   Temp:      TempSrc:      SpO2: 99% 100% 98% 100%  Weight:      Height:        Intake/Output Summary (Last 24 hours) at 08/01/2024 1142 Last data filed at 08/01/24 1100 Gross per 24 hour  Intake 1808.69 ml  Output 2593.6 ml  Net -784.91 ml  Filed Weights   07/14/24 0500 07/15/24 0615 07/26/2024 0500  Weight: 84.5 kg 81.8 kg 82 kg    Examination:   Chronically ill, sleepy but easily arousable, oriented to self and place HEENT: Right IJ HD catheter, + JVD CVS: S1-S2, sinus rhythm Lungs: Decreased breath sounds at the  bases Abdomen: Soft, nontender bowel sounds present Remedies: Trace edema, compression stockings on   Data Reviewed:   CBC: Recent Labs  Lab 07/12/24 0405 07/13/24 0500 07/14/24 0500 07/14/24 1021 07/14/24 1521 Jul 26, 2024 0517  WBC 6.6 7.8 8.1  --   --  8.0  HGB 9.6* 10.5* 9.6* 12.9 12.9 10.2*  HCT 28.2* 30.9* 28.3* 38.0 38.0 30.0*  MCV 79.0* 80.3 81.6  --   --  80.0  PLT 155 151 132*  --   --  111*   Basic Metabolic Panel: Recent Labs  Lab 07/12/24 0405 07/12/24 1608 07/13/24 0500 07/13/24 1547 07/14/24 0500 07/14/24 1021 07/14/24 1521 07/14/24 1533 07/15/24 0524 07/15/24 1540 Jul 26, 2024 0517  NA 122*   < > 125*   < > 127*   < > 133* 128* 130* 129* 130*  K 3.4*   < > 6.2*   < > 3.3*   < > 4.0 6.6* 3.3* 3.8 3.4*  CL 89*   < > 90*   < > 93*  --   --  93* 96* 96* 96*  CO2 23   < > 22   < > 24  --   --  24 24 23 23   GLUCOSE 333*   < > 366*   < > 330*  --   --  330* 216* 282* 228*  BUN 19   < > 9   < > 7*  --   --  7* 9 8 8   CREATININE 2.34*   < > 1.38*   < > 1.10*  --   --  1.09* 1.10* 1.08* 1.02*  CALCIUM  7.9*   < > 7.7*   < > 8.7*  --   --  8.4* 9.4 8.9 9.0  MG 1.9  --  2.4  --  2.0  --   --   --  2.1  --  2.1  PHOS 2.3*   < > 7.0*   < > 2.0*  --   --  8.5* 1.7* 2.5 1.5*   < > = values in this interval not displayed.   GFR: Estimated Creatinine Clearance: 50.4 mL/min (A) (by C-G formula based on SCr of 1.02 mg/dL (H)). Liver Function Tests: Recent Labs  Lab 07/11/24 1752 07/12/24 0405 07/14/24 0500 07/14/24 1533 07/15/24 0524 07/15/24 1540 2024/07/26 0517  AST 16  --   --   --   --   --   --   ALT <5  --   --   --   --   --   --   ALKPHOS 50  --   --   --   --   --   --   BILITOT 1.1  --   --   --   --   --   --   PROT 6.3*  --   --   --   --   --   --   ALBUMIN 2.4*   < > 2.5* 2.6* 2.6* 2.6* 2.5*   < > = values in this interval not displayed.   No results for input(s): LIPASE, AMYLASE in the last 168 hours. No results for input(s): AMMONIA in  the  last 168 hours. Coagulation Profile: No results for input(s): INR, PROTIME in the last 168 hours. Cardiac Enzymes: No results for input(s): CKTOTAL, CKMB, CKMBINDEX, TROPONINI in the last 168 hours. BNP (last 3 results) No results for input(s): PROBNP in the last 8760 hours. HbA1C: No results for input(s): HGBA1C in the last 72 hours.  CBG: Recent Labs  Lab 07/15/24 0631 07/15/24 1141 07/15/24 1556 07/15/24 2124 07/28/2024 0646  GLUCAP 200* 224* 235* 180* 178*   Lipid Profile: No results for input(s): CHOL, HDL, LDLCALC, TRIG, CHOLHDL, LDLDIRECT in the last 72 hours. Thyroid  Function Tests: No results for input(s): TSH, T4TOTAL, FREET4, T3FREE, THYROIDAB in the last 72 hours. Anemia Panel: Recent Labs    07/15/24 0524  FERRITIN 97  TIBC 297  IRON 21*   Urine analysis:    Component Value Date/Time   COLORURINE YELLOW 06/18/2024 1730   APPEARANCEUR CLEAR 06/18/2024 1730   LABSPEC 1.026 06/18/2024 1730   PHURINE 5.0 06/18/2024 1730   GLUCOSEU NEGATIVE 06/18/2024 1730   HGBUR NEGATIVE 06/18/2024 1730   BILIRUBINUR NEGATIVE 06/18/2024 1730   KETONESUR NEGATIVE 06/18/2024 1730   PROTEINUR NEGATIVE 06/18/2024 1730   UROBILINOGEN 0.2 04/13/2014 1612   NITRITE NEGATIVE 06/18/2024 1730   LEUKOCYTESUR NEGATIVE 06/18/2024 1730   Sepsis Labs: @LABRCNTIP (procalcitonin:4,lacticidven:4)  )No results found for this or any previous visit (from the past 240 hours).   Radiology Studies: No results found.   Scheduled Meds:  apixaban   5 mg Oral BID   atorvastatin   80 mg Oral Daily   Chlorhexidine  Gluconate Cloth  6 each Topical Q0600   feeding supplement  237 mL Oral TID BM   insulin  aspart  0-5 Units Subcutaneous QHS   insulin  aspart  0-6 Units Subcutaneous TID WC   insulin  aspart  3 Units Subcutaneous TID WC   insulin  glargine-yfgn  15 Units Subcutaneous Daily   levothyroxine   50 mcg Oral Q0600   midodrine  20 mg Oral Q8H    multivitamin  1 tablet Oral QHS   polyethylene glycol  17 g Oral BID   sodium chloride  flush  10-40 mL Intracatheter Q12H   sodium chloride  flush  3 mL Intravenous Q12H   sodium chloride  flush  3 mL Intravenous Q12H   Continuous Infusions:  amiodarone 30 mg/hr (04-Oct-2023 1100)   norepinephrine  (LEVOPHED ) Adult infusion 18 mcg/min (04-Oct-2023 1100)   potassium PHOSPHATE IVPB (in mmol)     prismasol BGK 4/2.5 1,000 mL/hr at 2024-07-28 0949   prismasol BGK 4/2.5 400 mL/hr at 2024/07/28 1001   prismasol BGK 4/2.5 400 mL/hr at 07-28-24 0956   vasopressin 0.03 Units/min (2024/07/28 1100)      LOS: 29 days    Time spent:    Sigurd Pac, MD [facility]   2024/07/28, 11:42 AM

## 2024-07-21 NOTE — Progress Notes (Signed)
 Dr Cherrie and [facility] with palliative care notified of drops in patient's BP and RR. BP continues to drop despite upward titration of vasoactive drips. Discussed current changes with her son who wishes to go ahead with comfort care this evening. Will stop CRRT.

## 2024-07-21 NOTE — Inpatient Diabetes Management (Signed)
 Inpatient Diabetes Program Recommendations  AACE/ADA: New Consensus Statement on Inpatient Glycemic Control   Target Ranges:  Prepandial:   less than 140 mg/dL      Peak postprandial:   less than 180 mg/dL (1-2 hours)      Critically ill patients:  140 - 180 mg/dL   Lab Results  Component Value Date   GLUCAP 178 (H) 2024/08/06   HGBA1C 7.4 (H) 07/03/2024    Latest Reference Range & Units 07/14/24 06:19 07/14/24 11:11 07/14/24 15:21 07/14/24 21:13 07/15/24 06:31 07/15/24 11:41 07/15/24 15:56 07/15/24 21:24 06-Aug-2024 06:46  Glucose-Capillary 70 - 99 mg/dL 826 (H) 828 (H) 796 (H) 212 (H) 200 (H) 224 (H) 235 (H) 180 (H) 178 (H)   Review of Glycemic Control  Diabetes history: Type 2 DM  Outpatient Diabetes medications: Jardiance  10 mg every day (NT), Glipizide  5 mg BID (NT), Lantus  20 units BID, Ozempic  1 mg qwk (NT)  Current orders for Inpatient glycemic control:  Semglee  15 units every day Novolog  3 units TID Novolog  0-6 units TID + 0-5 units HS   Inpatient Diabetes Program Recommendations:     Please consider increasing Semglee  to 17 units daily.   Lavanda Search, RN, MSN, [facility]  Inpatient Diabetes Coordinator  Pager [phone] (8a-5p)

## 2024-07-21 NOTE — Progress Notes (Signed)
 [facility] Progress Note    Assessment/ Plan:   Dialysis Dependent AKI on CKD3b: Likely secondary to new RV heart failure, persistent atrial fibrillation, cardiorenal Syndrome  -initially--failed high dose diuretic challenge (anuric) and CRRT initiated mainly for volume. Continued CRRT: 9/30-10/10. S/p TDC placement with IR on 10/15 -HD was on TTS schedule typically w/ midodrine support with avoidance of albumin. CLIP: was accepted to [facility] @ 9:30am, start 10/23 but on hold given that she is back in ICU -no evidence of renal recovery at this junction; GOC today as well. Checking bladder scan as well  -CRRT restarted 10/22 given hypotension/volume overload, goal CVP 6-7 (17-18 today). C/w CRRT today (change pre/post to 4k. UFG: net neg 50cc/hr. CO-Ox dropping as well 44.3.   Hyponatremia: hypervolemic.  Will attempt to manage with HD/UF, Na up to 130   AoC BiV HF; New onset RV failure, cardiogenic shock -pressor support per primary service, c/w midodrine   Atrial fibrillation with RVR: S/p cardioversion 10/5 & 10/24.  On eliquis  on IV amio. Per cardiology   CAD: On atorvastatin   Hypokalemia- on 2k bags for repl (change to 4K), will adjust dialysate to 4k for K 3.3, further adjustments based on serial labs  Anemia- hgb 12.9, transfuse prn for hgb < 7   Deconditioning - per primary service  Palliative care following, she is DNR. Possible transition to comfort care soon.  Discussed with ICU RN      Subjective:   Patient seen and examined on CRRT.   Patient denies any worsening SOB, no complaints otherwise. Net neg ~734L/ 24hr Pressors: levo (17), vaso   Objective:   BP 91/64   Pulse 94   Temp (!) 96.7 F (35.9 C) (Axillary)   Resp 13   Ht 5' 5 (1.651 m)   Wt 82 kg   SpO2 (!) 89%   BMI 30.08 kg/m   Intake/Output Summary (Last 24 hours) at July 26, 2024 0843 Last data filed at Jul 26, 2024 0800 Gross per 24 hour  Intake 1998.71 ml   Output 2651.6 ml  Net -652.89 ml   Weight change: 0.2 kg  Physical Exam: Gen: ill appearing, NAD CVS: irreg irreg Resp: decreased breath sounds bibasilar, normal wob Abd: soft Ext: trace pitting dependent edema Neuro: drowsy but arousable/interactive Dialysis access: RIJ TDC in use  Imaging: No results found.  Labs: BMET Recent Labs  Lab 07/13/24 0500 07/13/24 1547 07/14/24 0500 07/14/24 1021 07/14/24 1521 07/14/24 1533 07/15/24 0524 07/15/24 1540 07/26/24 0517  NA 125* 130* 127* 133* 133* 128* 130* 129* 130*  K 6.2* 3.6 3.3* 3.5 4.0 6.6* 3.3* 3.8 3.4*  CL 90* 96* 93*  --   --  93* 96* 96* 96*  CO2 22 24 24   --   --  24 24 23 23   GLUCOSE 366* 267* 330*  --   --  330* 216* 282* 228*  BUN 9 8 7*  --   --  7* 9 8 8   CREATININE 1.38* 1.25* 1.10*  --   --  1.09* 1.10* 1.08* 1.02*  CALCIUM  7.7* 8.2* 8.7*  --   --  8.4* 9.4 8.9 9.0  PHOS 7.0* 2.2* 2.0*  --   --  8.5* 1.7* 2.5 1.5*   CBC Recent Labs  Lab 07/12/24 0405 07/13/24 0500 07/14/24 0500 07/14/24 1021 07/14/24 1521 07/26/24 0517  WBC 6.6 7.8 8.1  --   --  8.0  HGB 9.6* 10.5* 9.6* 12.9 12.9 10.2*  HCT 28.2* 30.9* 28.3* 38.0  38.0 30.0*  MCV 79.0* 80.3 81.6  --   --  80.0  PLT 155 151 132*  --   --  111*    Medications:     apixaban   5 mg Oral BID   atorvastatin   80 mg Oral Daily   Chlorhexidine  Gluconate Cloth  6 each Topical Q0600   feeding supplement  237 mL Oral TID BM   insulin  aspart  0-5 Units Subcutaneous QHS   insulin  aspart  0-6 Units Subcutaneous TID WC   insulin  aspart  3 Units Subcutaneous TID WC   insulin  glargine-yfgn  15 Units Subcutaneous Daily   levothyroxine   50 mcg Oral Q0600   midodrine  20 mg Oral Q8H   multivitamin  1 tablet Oral QHS   polyethylene glycol  17 g Oral BID   sodium chloride  flush  10-40 mL Intracatheter Q12H   sodium chloride  flush  3 mL Intravenous Q12H   sodium chloride  flush  3 mL Intravenous Q12H

## 2024-07-21 NOTE — Progress Notes (Signed)
 PT Cancellation Note  Patient Details Name: [name] MRN: [id number] DOB: 10/27/1950   Cancelled Treatment:    Reason Eval/Treat Not Completed: Medical issues which prohibited therapy;Patient not medically ready (Pt has had decline in medical status. Will sign off. Please reorder if pt status changes.)   Stephane [name] 20-Jul-2024, 9:13 AM Arrin Pintor M,PT Acute Rehab Services [phone]

## 2024-07-21 NOTE — Progress Notes (Signed)
 Critical lab values called to me by JINNY Langdon. Notified him that patient has gone comfort care at this time.

## 2024-07-21 NOTE — Progress Notes (Signed)
 Nutrition Brief Note  Chart reviewed. Noted plan to transition to comfort care tomorrow, time TBD. No further nutrition interventions planned at this time.  Please re-consult as needed.   Betsey Finger MS, RDN, LDN, CNSC Registered Dietitian 3 Clinical Nutrition RD Inpatient Contact Info in Amion

## 2024-07-21 NOTE — Plan of Care (Signed)
  Problem: Cardiac: Goal: Ability to achieve and maintain adequate cardiopulmonary perfusion will improve Outcome: Progressing   Problem: Education: Goal: Knowledge of General Education information will improve Description: Including pain rating scale, medication(s)/side effects and non-pharmacologic comfort measures Outcome: Progressing   

## 2024-07-21 NOTE — Progress Notes (Signed)
 Advanced Heart Failure Rounding Note  HF Cardiologist: Dr. Cherrie Chief Complaint: CHF Patient Profile     73 y/o F seen earlier this admission w/ cardiogenic shock in setting of Afib, course c/b renal failure now HD dependent.  Had stabilized from HF standpoint with plans for SNF for rehab.  We were asked to see again d/t recurrent Afib with RVR>>hypotension/shock. Started on NE and restarted on CRRT.  Subjective:    Underwent repeat DC-CV 10/24  Remains in NSR. Remains on NE 14 -> 22 and VP 0.03  On CVVHD pulling -50-100 Weight unchanged  Says she is tired and wants to stop   Objective:    Weight Range: 82 kg Body mass index is 30.08 kg/m.   Vital Signs:   Temp:  [96.7 F (35.9 C)-97.7 F (36.5 C)] 96.7 F (35.9 C) (10/26 2215) Pulse Rate:  [77-95] 86 26-Jul-2024 1100) Resp:  [11-26] 13 26-Jul-2024 1100) BP: (72-153)/(37-137) 108/72 07-26-24 1100) SpO2:  [75 %-100 %] 100 % 2024-07-26 1100) Weight:  [82 kg] 82 kg 2024-07-26 0500) Last BM Date : 07/26/24  Weight change: Filed Weights   07/14/24 0500 07/15/24 0615 Jul 26, 2024 0500  Weight: 84.5 kg 81.8 kg 82 kg   Intake/Output:  Intake/Output Summary (Last 24 hours) at 07-26-2024 1114 Last data filed at 07-26-2024 1100 Gross per 24 hour  Intake 1808.69 ml  Output 2593.6 ml  Net -784.91 ml   Physical Exam   General:Elderly weak  No resp difficulty HEENT: normal Neck: supple. JVP 8  RSCV hd cath Cor: Regular rate & rhythm. No rubs, gallops or murmurs. Lungs: clear Abdomen: soft, nontender, nondistended.Good bowel sounds. Extremities: no cyanosis, clubbing, rash, edema Neuro: alert & orientedx3, cranial nerves grossly intact. moves all 4 extremities w/o difficulty. Affect pleasant   Labs    CBC Recent Labs    07/14/24 0500 07/14/24 1021 07/14/24 1521 07-26-2024 0517  WBC 8.1  --   --  8.0  HGB 9.6*   < > 12.9 10.2*  HCT 28.3*   < > 38.0 30.0*  MCV 81.6  --   --  80.0  PLT 132*  --   --  111*   < > = values in  this interval not displayed.   Basic Metabolic Panel Recent Labs    89/73/74 0524 07/15/24 1540 07-26-24 0517  NA 130* 129* 130*  K 3.3* 3.8 3.4*  CL 96* 96* 96*  CO2 24 23 23   GLUCOSE 216* 282* 228*  BUN 9 8 8   CREATININE 1.10* 1.08* 1.02*  CALCIUM  9.4 8.9 9.0  MG 2.1  --  2.1  PHOS 1.7* 2.5 1.5*   Liver Function Tests Recent Labs    07/15/24 1540 26-Jul-2024 0517  ALBUMIN 2.6* 2.5*    BNP (last 3 results) Recent Labs    06/17/24 1813  BNP 1,998.5*   Medications:    Scheduled Medications:  apixaban   5 mg Oral BID   atorvastatin   80 mg Oral Daily   Chlorhexidine  Gluconate Cloth  6 each Topical Q0600   feeding supplement  237 mL Oral TID BM   insulin  aspart  0-5 Units Subcutaneous QHS   insulin  aspart  0-6 Units Subcutaneous TID WC   insulin  aspart  3 Units Subcutaneous TID WC   insulin  glargine-yfgn  15 Units Subcutaneous Daily   levothyroxine   50 mcg Oral Q0600   midodrine  20 mg Oral Q8H   multivitamin  1 tablet Oral QHS   polyethylene glycol  17 g  Oral BID   sodium chloride  flush  10-40 mL Intracatheter Q12H   sodium chloride  flush  3 mL Intravenous Q12H   sodium chloride  flush  3 mL Intravenous Q12H    Infusions:  amiodarone 30 mg/hr (August 11, 2024 1100)   norepinephrine  (LEVOPHED ) Adult infusion 18 mcg/min (08-11-2024 1100)   prismasol BGK 4/2.5 1,000 mL/hr at Aug 11, 2024 0949   prismasol BGK 4/2.5 400 mL/hr at 2024/08/11 1001   prismasol BGK 4/2.5 400 mL/hr at 08-11-24 9043   vasopressin 0.03 Units/min (08/11/24 1100)     PRN Medications: acetaminophen  **OR** acetaminophen , albuterol , bisacodyl, calcium  carbonate, Gerhardt's butt cream, guaiFENesin -dextromethorphan , heparin , HYDROmorphone  (DILAUDID ) injection, loperamide, naLOXone (NARCAN)  injection, ondansetron  **OR** ondansetron  (ZOFRAN ) IV, mouth rinse, senna-docusate, sodium chloride  flush, sodium chloride  flush, traMADol   Patient Profile   ANAHI BELMAR is a 73 y.o.  with history of combined systolic  and diastolic heart failure s/p BiV ICD, CAD, OSA on bipap, LBBB, HTN, hypotension, and new onset atrial fibrillation.   Admit with acute on chronic biventricular HF in setting of AF c/b acute renal failure.  Assessment/Plan   Acute on Chronic Biventricular Heart Failure  New onset RV Failure -> cardiogenic shock - Presented in cardiogenic shock, worsening renal failure, afib - echo 9/30: EF 20-25%, mod LVH, D-shaped septum, RV mod reduced, degenerative MV w/ severe MR - RHC 9/30: RA 17, PA 63/35 (46), PCW 40, Fick 5.7/2.8, TD 3.7/1.8, PVR 1.6, PAPi 1.6  - s/p repeat DC-CV 10/24 - now on NE 18 and VP 0.03 - we have been unable to wean  - remains on  CRRT for volume management - continue midodrine 20 mg TID - She is end-stage - Now full DNR - Still requiring high-dose pressors. Long talk with her and her family today. She is clear about the fact that she is tired and wants to stop treatment. D/w family and they are in agreement but would like until tomorrow to get family togatehr to transition to comfort care in hospital. D/w TRH and Palliative. Will add prn Dilaudid .   Persistent Atrial Fibrillation - new onset 8/25 - Continue Eliquis  5 mg BID - 10/5 S/P TEE/DC-CV -->SR. Back in AF with RVR, rates now 90s-low 100s - Repeat DC-CV on 10/24 -> Remains in NSR - Continue IV amio 30/h for now  Acute renal failure - cardio-renal  - CRRT per nephrology,  will continue today - remains anuric   CAD - LHC in 2015 showed 99% RCA occlusion with collaterals - hs-trop 35>35, No ACS - continue atorva 80 mg daily - No s/s angina  CRITICAL CARE Performed by: Cherrie Sieving  Total critical care time: 45 minutes  Critical care time was exclusive of separately billable procedures and treating other patients.  Critical care was necessary to treat or prevent imminent or life-threatening deterioration.  Critical care was time spent personally by me (independent of midlevel providers or  residents) on the following activities: development of treatment plan with patient and/or surrogate as well as nursing, discussions with consultants, evaluation of patient's response to treatment, examination of patient, obtaining history from patient or surrogate, ordering and performing treatments and interventions, ordering and review of laboratory studies, ordering and review of radiographic studies, pulse oximetry and re-evaluation of patient's condition.   Length of Stay: 29  Sieving Cherrie, MD  2024/08/11, 11:14 AM  Advanced Heart Failure Team Pager [phone] (M-F; 7a - 5p)  Please contact [facility] for night-coverage after hours (5p -7a ) and weekends on [email or web address]

## 2024-07-21 NOTE — Progress Notes (Signed)
 Pulseless, confirmed with second RN. Time of death 64.

## 2024-07-21 NOTE — Progress Notes (Signed)
 Daily Progress Note   Patient Name: [name]       Date: 08/01/2024 DOB: [birth year]  Age: 73 y.o. MRN#: [id number] Attending Physician: Fairy Frames, MD Primary Care Physician: Tobie Suzzane POUR, MD Admit Date: 06/17/2024  Reason for Consultation/Follow-up: Establishing goals of care  Length of Stay: 29  Current Medications: Scheduled Meds:   apixaban   5 mg Oral BID   atorvastatin   80 mg Oral Daily   Chlorhexidine  Gluconate Cloth  6 each Topical Q0600   feeding supplement  237 mL Oral TID BM   insulin  aspart  0-5 Units Subcutaneous QHS   insulin  aspart  0-6 Units Subcutaneous TID WC   insulin  aspart  3 Units Subcutaneous TID WC   insulin  glargine-yfgn  15 Units Subcutaneous Daily   levothyroxine   50 mcg Oral Q0600   midodrine  20 mg Oral Q8H   multivitamin  1 tablet Oral QHS   polyethylene glycol  17 g Oral BID   sodium chloride  flush  10-40 mL Intracatheter Q12H   sodium chloride  flush  3 mL Intravenous Q12H   sodium chloride  flush  3 mL Intravenous Q12H    Continuous Infusions:  amiodarone 30 mg/hr (09/06/24 1300)   norepinephrine  (LEVOPHED ) Adult infusion 18 mcg/min (09/06/24 1300)   potassium PHOSPHATE IVPB (in mmol) 64.4 mL/hr at 08-01-2024 1300   prismasol BGK 4/2.5 1,000 mL/hr at 2024/08/01 0949   prismasol BGK 4/2.5 400 mL/hr at 2024/08/01 1001   prismasol BGK 4/2.5 400 mL/hr at 08-01-24 0956   vasopressin 0.03 Units/min (09/06/24 1300)    PRN Meds: acetaminophen  **OR** acetaminophen , albuterol , bisacodyl, calcium  carbonate, Gerhardt's butt cream, guaiFENesin -dextromethorphan , heparin , HYDROmorphone  (DILAUDID ) injection, loperamide, naLOXone (NARCAN)  injection, ondansetron  **OR** ondansetron  (ZOFRAN ) IV, mouth rinse, senna-docusate, sodium chloride  flush, sodium  chloride flush, traMADol   Physical Exam Vitals reviewed.  Constitutional:      General: She is sleeping. She is not in acute distress.    Appearance: She is ill-appearing.  HENT:     Head: Normocephalic and atraumatic.  Cardiovascular:     Rate and Rhythm: Normal rate.  Pulmonary:     Effort: Pulmonary effort is normal.  Neurological:     Mental Status: She is lethargic.             Vital Signs: BP (!) 83/61   Pulse  84   Temp (!) 96.7 F (35.9 C) (Axillary)   Resp 16   Ht 5' 5 (1.651 m)   Wt 82 kg   SpO2 94%   BMI 30.08 kg/m  SpO2: SpO2: 94 % O2 Device: O2 Device: Room Air O2 Flow Rate: O2 Flow Rate (L/min): 1.5 L/min    Patient Active Problem List   Diagnosis Date Noted   Pressure injury of skin 07/06/2024   Chronic kidney disease, stage 3b (HCC) 07/06/2024   Chronic anemia 07/06/2024   Malnutrition of moderate degree 06/22/2024   Hypotension 06/18/2024   AKI (acute kidney injury) 06/18/2024   Acute on chronic congestive heart failure (HCC) 06/18/2024   Acute on chronic systolic CHF (congestive heart failure) (HCC) 06/17/2024   Paroxysmal atrial fibrillation (HCC) 06/12/2024   Acute on chronic combined systolic and diastolic congestive heart failure (HCC) 12/13/2023   Hospital discharge follow-up 12/13/2023   Dyspnea 12/13/2023   Influenza 11/17/2023   Type 2 diabetes mellitus with hyperglycemia, with long-term current use of insulin  (HCC) 08/04/2023   Acute mucoid otitis media of both ears 08/04/2023   Type 2 diabetes mellitus with hyperlipidemia (HCC) 06/17/2023   URTI (acute upper respiratory infection) 09/27/2022   Gait abnormality 09/27/2022   OSA (obstructive sleep apnea) 05/01/2020   Obesity 05/01/2020   Hypothyroidism 05/01/2020   Cardiomyopathy, ischemic 04/04/2014   Essential hypertension 04/04/2014   Chronic combined systolic and diastolic CHF (congestive heart failure) (HCC) 04/04/2014   Coronary artery disease 03/31/2014   Type 2 diabetes  mellitus with diabetic neuropathy (HCC) 03/31/2014   DCM (dilated cardiomyopathy) (HCC) 03/29/2014   Anemia 03/26/2014   Cardiogenic shock (HCC) 03/23/2014   Consolidation of right lower lobe of lung 03/23/2014   Gastroparesis 02/14/2014   Chronic constipation 11/30/2012   GERD (gastroesophageal reflux disease) 11/30/2012   Fatty liver 11/30/2012    Palliative Care Assessment & Plan   Patient Profile: 73 y.o. female admitted on 06/17/2024 with medical history significant for CAD, persistent atrial fibrillation on Eliquis , chronic HFmrEF s/p CRT-D, LBBB, T2DM, HTN, HLD, CKD stage IIIa, hypothyroidism, OSA who presented to the ED for evaluation of chest pain, dyspnea, cough, increased swelling bilateral lower extremities. Patient remains on CRRT and per nephrology would not be able to tolerate iHD due to need for multiple pressors. 10/9 CRRT stopped. Transition to [facility] 10/11. Decline with AF RVR and hypotension 10/22 resulting in return to ICU with initiation of vasopressor support and CRRT again.   Today's Discussion: Reviewed chart and received updates from nursing, chaplain, and attending provider. Patient continues on CRRT and vasopressors. Over the weekend patient shared with providers and family that she would like to stop aggressive treatment and transition to comfort measures. Son asked for time for family to visit.   Patient lying in bed in NAD. She is sleeping/lethargic and does not easily awaken. Patient's son [name] and granddaughter are at bedside. [name] shared today has been difficult for him. He shared that they have made the decision to transition to comfort measures tomorrow (allowing time for family to visit). He believes the patient is ready to go to heaven. We discussed the patient's faith and understanding that she will be in a better place after she passes away. [name] shared he understands she is approaching her end of life and can see changes in her.   [name] asked me to  review what will happen tomorrow once the patient is transitioned to comfort measures. We discussed that the patient would no longer receive  aggressive medical interventions such as continuous vital signs, lab work, radiology testing, or medications not focused on comfort. All care would focus on how the patient is looking and feeling. This would include management of any symptoms that may cause discomfort, pain, shortness of breath, cough, nausea, agitation, anxiety, and/or secretions etc. Symptoms would be managed with medications and other non-pharmacological interventions. Answered questions about what happens to the patient after they have passed away.   Emotional support and therapeutic listening provided. Encouraged patient's son to call PMT with questions or concerns. Will have a PMT provider follow up tomorrow.  Recommendations/Plan: DNR Plan to transition patient to comfort measures tomorrow- time TBD Continued PMT support    Code Status:    Code Status Orders  (From admission, onward)           Start     Ordered   07/14/24 0947  Do not attempt resuscitation (DNR)- Limited -Do Not Intubate (DNI)  (Code Status)  Continuous       Question Answer Comment  If pulseless and not breathing No CPR or chest compressions.   In Pre-Arrest Conditions (Patient Is Breathing and Has A Pulse) Do not intubate. Provide all appropriate non-invasive medical interventions. Avoid ICU transfer unless indicated or required.   Consent: Discussion documented in EHR or advanced directives reviewed      07/14/24 0946         Extensive chart review has been completed prior to seeing the patient including labs, vital signs, imaging, progress/consult notes, orders, medications, and available advance directive documents.  Care plan was discussed with bedside RN, Dr. Fairy, and Dr. Cherrie  Time spent: 50 minutes  Thank you for allowing the Palliative Medicine Team to assist in the care of this  patient.    Stephane CHRISTELLA Palin, NP  Please contact Palliative Medicine Team phone at [phone] for questions and concerns.

## 2024-07-21 DEATH — deceased

## 2024-07-24 ENCOUNTER — Inpatient Hospital Stay (HOSPITAL_COMMUNITY)

## 2024-07-30 ENCOUNTER — Ambulatory Visit

## 2024-08-06 ENCOUNTER — Ambulatory Visit: Payer: Medicare HMO

## 2024-08-20 NOTE — Discharge Summary (Signed)
 Death Summary  [name] FMW:[id number] DOB: 06/23/1951 DOA: 03-Jul-2024  PCP: Tobie Suzzane POUR, MD   Admit date: 07/03/24 Date of Death: 2024/08/01  Final Diagnoses:  Principal Problem:   Acute on chronic systolic CHF (congestive heart failure) (HCC) Cardiogenic shock Severe mitral regurgitation AKI on CKD 4   Coronary artery disease   Essential hypertension   Paroxysmal atrial fibrillation (HCC)   Type 2 diabetes mellitus with hyperlipidemia (HCC)   Hypothyroidism   Malnutrition of moderate degree   OSA (obstructive sleep apnea)   Pressure injury of skin   Chronic anemia   History of present illness:  73 y.o. with history of combined systolic and diastolic heart failure s/p BiV ICD, CAD, OSA on bipap, LBBB, HTN, hypotension, and atrial fibrillation.  - Admitted with volume overload, AKI - Echo 9/30 noted EF 20-25%, severe MR, moderately reduced RV - RHC 9/30> elevated PA pressures and filling pressures, PA PI of 1.6 - Treated with inotropes and vasopressors in ICU, now off - Complicated by worsening renal failure, cardiorenal syndrome, failed high-dose diuretic challenge, then started on CRRT from 9/30-10/10 -sp cardioversion for A-fib -Palliative care consulted and following - 10/11 initiated hemodialysis - Transferred out of ICU to [facility] service 10/14 -10/15: Tunneled dialysis catheter placed in IR - Multiple palliative care conversations, poor prognosis - CLIP for HD completed : Accepted to [facility] - 10/21 PM noted to be back in A-fib -10/22: A-fib RVR with hypotension> started on Amio gtt., transferred to ICU, started CRRT for volume overload, started on Norepi - 10/23, CRRT continued, remains on NE -10/24: Vasopressin started - 10/25-26, unable to be weaned off nor epi or vasopressin  Hospital Course:   Acute on chronic biventricular failure Cardiogenic shock -Admitted with cardiogenic shock, worsening renal failure and A-fib - Echo with EF 2025,  moderately reduced RV, severe mitral regurgitation -RHC 9/30 noted elevated PA pressures wedge of 40, PA PI 1.6 - Treated with vasopressors, inotropes and high-dose diuretics, TEE/DCCV - With worsening renal failure started CRRT then intermittent hemodialysis since 10/11, continued on high-dose midodrine -Now back on CRRT since 10/22, CVP improving,  -On a NE, 11 mcg and vasopressin> attempt to wean as tolerated - Palliative care following, prognosis is poor>10/25 agreed to full DNR, discussed comfort care with son on Saturday - Dr. Cherrie commended comfort care again to the patient and family, transition to comfort care subsequently expired on 01-Aug-2024   Severe mitral regurgitation -Noted on echo this admission, not felt to be a mitral clip candidate   Paroxysmal atrial fibrillation - New onset in August - Cardioverted 10/5 - was stable in sinus rhythm on amiodarone and Eliquis  - Back in A-fib RVR 10/22, started on Amio gtt., cardioverted again 10/24   AKI on CKD4 - Cardiorenal, failed high-dose diuretics - Treated with CRRT 9/30-10/10 - Started on intermittent hemodialysis 10/11 - Nephrology following, sp HD Cath and CLIP, - Was back on CRRT, now comfort care   Hyponatremia - Stable   CAD -Stable at this time, no ACS on admission, continue atorvastatin    Acute hypoxic respiratory failure - Secondary to above, improving   Type 2 diabetes mellitus - Remains on Semglee  and meal coverage   Microcytic anemia - Stable, monitor   Time:  Signed:  Sigurd Pac  [facility] 07/25/2024, 11:25 AM

## 2024-11-05 ENCOUNTER — Ambulatory Visit: Payer: Medicare HMO

## 2025-02-04 ENCOUNTER — Ambulatory Visit: Payer: Medicare HMO

## 2025-03-25 ENCOUNTER — Ambulatory Visit

## 2025-05-06 ENCOUNTER — Ambulatory Visit: Payer: Medicare HMO

## 2025-08-05 ENCOUNTER — Ambulatory Visit: Payer: Medicare HMO
